# Patient Record
Sex: Male | Born: 1950 | Race: Black or African American | Hispanic: No | Marital: Married | Smoking: Former smoker
Health system: Southern US, Community
[De-identification: ages and names within clinical notes are randomized; demographics above are authoritative.]

## PROBLEM LIST (undated history)

## (undated) DIAGNOSIS — I10 Essential (primary) hypertension: Secondary | ICD-10-CM

## (undated) DIAGNOSIS — M549 Dorsalgia, unspecified: Secondary | ICD-10-CM

## (undated) DIAGNOSIS — U071 COVID-19: Secondary | ICD-10-CM

## (undated) DIAGNOSIS — K8020 Calculus of gallbladder without cholecystitis without obstruction: Secondary | ICD-10-CM

## (undated) DIAGNOSIS — M1990 Unspecified osteoarthritis, unspecified site: Secondary | ICD-10-CM

## (undated) DIAGNOSIS — C439 Malignant melanoma of skin, unspecified: Secondary | ICD-10-CM

## (undated) DIAGNOSIS — D649 Anemia, unspecified: Secondary | ICD-10-CM

## (undated) DIAGNOSIS — J349 Unspecified disorder of nose and nasal sinuses: Secondary | ICD-10-CM

## (undated) DIAGNOSIS — R011 Cardiac murmur, unspecified: Secondary | ICD-10-CM

## (undated) DIAGNOSIS — M51369 Other intervertebral disc degeneration, lumbar region without mention of lumbar back pain or lower extremity pain: Secondary | ICD-10-CM

## (undated) DIAGNOSIS — J189 Pneumonia, unspecified organism: Secondary | ICD-10-CM

## (undated) DIAGNOSIS — C9000 Multiple myeloma not having achieved remission: Secondary | ICD-10-CM

## (undated) DIAGNOSIS — C61 Malignant neoplasm of prostate: Secondary | ICD-10-CM

## (undated) HISTORY — PX: CHOLECYSTECTOMY: SHX55

## (undated) HISTORY — PX: APPENDECTOMY: SHX54

---

## 1999-07-22 ENCOUNTER — Emergency Department (HOSPITAL_COMMUNITY): Admission: EM | Admit: 1999-07-22 | Discharge: 1999-07-22 | Payer: Self-pay | Admitting: Emergency Medicine

## 1999-08-07 ENCOUNTER — Emergency Department (HOSPITAL_COMMUNITY): Admission: EM | Admit: 1999-08-07 | Discharge: 1999-08-07 | Payer: Self-pay | Admitting: Emergency Medicine

## 2001-01-01 ENCOUNTER — Encounter: Payer: Self-pay | Admitting: Family Medicine

## 2001-01-01 ENCOUNTER — Ambulatory Visit (HOSPITAL_COMMUNITY): Admission: RE | Admit: 2001-01-01 | Discharge: 2001-01-01 | Payer: Self-pay

## 2001-07-18 ENCOUNTER — Encounter: Payer: Self-pay | Admitting: Emergency Medicine

## 2001-07-18 ENCOUNTER — Encounter: Payer: Self-pay | Admitting: General Surgery

## 2001-07-18 ENCOUNTER — Emergency Department (HOSPITAL_COMMUNITY): Admission: EM | Admit: 2001-07-18 | Discharge: 2001-07-18 | Payer: Self-pay | Admitting: Emergency Medicine

## 2001-07-20 ENCOUNTER — Ambulatory Visit (HOSPITAL_COMMUNITY): Admission: RE | Admit: 2001-07-20 | Discharge: 2001-07-21 | Payer: Self-pay | Admitting: General Surgery

## 2001-07-20 ENCOUNTER — Encounter (INDEPENDENT_AMBULATORY_CARE_PROVIDER_SITE_OTHER): Payer: Self-pay | Admitting: *Deleted

## 2001-11-26 ENCOUNTER — Emergency Department (HOSPITAL_COMMUNITY): Admission: EM | Admit: 2001-11-26 | Discharge: 2001-11-26 | Payer: Self-pay

## 2002-06-29 ENCOUNTER — Emergency Department (HOSPITAL_COMMUNITY): Admission: EM | Admit: 2002-06-29 | Discharge: 2002-06-29 | Payer: Self-pay | Admitting: Emergency Medicine

## 2003-01-28 ENCOUNTER — Emergency Department (HOSPITAL_COMMUNITY): Admission: EM | Admit: 2003-01-28 | Discharge: 2003-01-28 | Payer: Self-pay | Admitting: *Deleted

## 2003-01-28 ENCOUNTER — Encounter: Payer: Self-pay | Admitting: Emergency Medicine

## 2003-05-19 ENCOUNTER — Emergency Department (HOSPITAL_COMMUNITY): Admission: EM | Admit: 2003-05-19 | Discharge: 2003-05-19 | Payer: Self-pay | Admitting: Emergency Medicine

## 2004-07-15 HISTORY — PX: OTHER SURGICAL HISTORY: SHX169

## 2004-08-09 ENCOUNTER — Ambulatory Visit: Payer: Self-pay | Admitting: Nurse Practitioner

## 2004-08-10 ENCOUNTER — Ambulatory Visit: Payer: Self-pay | Admitting: *Deleted

## 2005-04-17 ENCOUNTER — Emergency Department (HOSPITAL_COMMUNITY): Admission: EM | Admit: 2005-04-17 | Discharge: 2005-04-17 | Payer: Self-pay | Admitting: Emergency Medicine

## 2005-10-22 ENCOUNTER — Emergency Department (HOSPITAL_COMMUNITY): Admission: EM | Admit: 2005-10-22 | Discharge: 2005-10-22 | Payer: Self-pay | Admitting: Emergency Medicine

## 2006-02-13 ENCOUNTER — Emergency Department (HOSPITAL_COMMUNITY): Admission: EM | Admit: 2006-02-13 | Discharge: 2006-02-13 | Payer: Self-pay | Admitting: Emergency Medicine

## 2006-02-20 ENCOUNTER — Ambulatory Visit: Payer: Self-pay | Admitting: Nurse Practitioner

## 2006-02-24 ENCOUNTER — Emergency Department (HOSPITAL_COMMUNITY): Admission: EM | Admit: 2006-02-24 | Discharge: 2006-02-24 | Payer: Self-pay | Admitting: Emergency Medicine

## 2006-03-13 ENCOUNTER — Ambulatory Visit: Payer: Self-pay | Admitting: Nurse Practitioner

## 2006-03-28 ENCOUNTER — Ambulatory Visit: Payer: Self-pay | Admitting: Nurse Practitioner

## 2006-04-03 ENCOUNTER — Ambulatory Visit: Payer: Self-pay | Admitting: Nurse Practitioner

## 2006-04-10 ENCOUNTER — Ambulatory Visit: Payer: Self-pay | Admitting: Nurse Practitioner

## 2006-05-15 ENCOUNTER — Ambulatory Visit: Payer: Self-pay | Admitting: Nurse Practitioner

## 2006-08-04 ENCOUNTER — Emergency Department (HOSPITAL_COMMUNITY): Admission: EM | Admit: 2006-08-04 | Discharge: 2006-08-04 | Payer: Self-pay | Admitting: Emergency Medicine

## 2006-08-08 ENCOUNTER — Ambulatory Visit: Payer: Self-pay | Admitting: Internal Medicine

## 2006-08-09 ENCOUNTER — Ambulatory Visit (HOSPITAL_COMMUNITY): Admission: RE | Admit: 2006-08-09 | Discharge: 2006-08-09 | Payer: Self-pay | Admitting: Internal Medicine

## 2006-08-29 ENCOUNTER — Ambulatory Visit: Payer: Self-pay | Admitting: Internal Medicine

## 2006-09-09 ENCOUNTER — Encounter (INDEPENDENT_AMBULATORY_CARE_PROVIDER_SITE_OTHER): Payer: Self-pay | Admitting: Cardiology

## 2006-09-09 ENCOUNTER — Ambulatory Visit (HOSPITAL_COMMUNITY): Admission: RE | Admit: 2006-09-09 | Discharge: 2006-09-09 | Payer: Self-pay | Admitting: Family Medicine

## 2006-09-19 ENCOUNTER — Ambulatory Visit: Payer: Self-pay | Admitting: Nurse Practitioner

## 2006-11-18 ENCOUNTER — Emergency Department (HOSPITAL_COMMUNITY): Admission: EM | Admit: 2006-11-18 | Discharge: 2006-11-18 | Payer: Self-pay | Admitting: Emergency Medicine

## 2006-12-18 ENCOUNTER — Ambulatory Visit: Payer: Self-pay | Admitting: Internal Medicine

## 2007-04-01 ENCOUNTER — Encounter (INDEPENDENT_AMBULATORY_CARE_PROVIDER_SITE_OTHER): Payer: Self-pay | Admitting: *Deleted

## 2007-05-12 ENCOUNTER — Ambulatory Visit: Payer: Self-pay | Admitting: Internal Medicine

## 2007-08-18 ENCOUNTER — Ambulatory Visit: Payer: Self-pay | Admitting: Family Medicine

## 2007-10-24 ENCOUNTER — Emergency Department (HOSPITAL_COMMUNITY): Admission: EM | Admit: 2007-10-24 | Discharge: 2007-10-24 | Payer: Self-pay | Admitting: Emergency Medicine

## 2008-02-09 ENCOUNTER — Ambulatory Visit: Payer: Self-pay | Admitting: Internal Medicine

## 2008-08-09 ENCOUNTER — Ambulatory Visit: Payer: Self-pay | Admitting: Family Medicine

## 2008-08-09 ENCOUNTER — Encounter (INDEPENDENT_AMBULATORY_CARE_PROVIDER_SITE_OTHER): Payer: Self-pay | Admitting: Internal Medicine

## 2008-08-09 LAB — CONVERTED CEMR LAB
HCT: 36.4 % — ABNORMAL LOW (ref 39.0–52.0)
Lymphocytes Relative: 46 % (ref 12–46)
MCHC: 31.9 g/dL (ref 30.0–36.0)
MCV: 97.3 fL (ref 78.0–100.0)
Neutro Abs: 2.1 10*3/uL (ref 1.7–7.7)
Platelets: 207 10*3/uL (ref 150–400)
RDW: 12.9 % (ref 11.5–15.5)
WBC: 4.6 10*3/uL (ref 4.0–10.5)

## 2008-08-29 ENCOUNTER — Ambulatory Visit: Payer: Self-pay | Admitting: Family Medicine

## 2008-08-29 ENCOUNTER — Encounter (INDEPENDENT_AMBULATORY_CARE_PROVIDER_SITE_OTHER): Payer: Self-pay | Admitting: Internal Medicine

## 2008-08-29 LAB — CONVERTED CEMR LAB
Basophils Absolute: 0 10*3/uL (ref 0.0–0.1)
Basophils Relative: 0 % (ref 0–1)
Eosinophils Relative: 1 % (ref 0–5)
MCHC: 31.9 g/dL (ref 30.0–36.0)
RBC: 3.56 M/uL — ABNORMAL LOW (ref 4.22–5.81)
RDW: 13 % (ref 11.5–15.5)
WBC: 4.1 10*3/uL (ref 4.0–10.5)

## 2008-09-16 ENCOUNTER — Ambulatory Visit: Payer: Self-pay | Admitting: Oncology

## 2008-10-24 LAB — COMPREHENSIVE METABOLIC PANEL
ALT: 23 U/L (ref 0–53)
AST: 24 U/L (ref 0–37)
Creatinine, Ser: 0.99 mg/dL (ref 0.40–1.50)
Total Bilirubin: 0.5 mg/dL (ref 0.3–1.2)

## 2008-10-24 LAB — CBC & DIFF AND RETIC
BASO%: 0.4 % (ref 0.0–2.0)
Basophils Absolute: 0 10*3/uL (ref 0.0–0.1)
EOS%: 0.7 % (ref 0.0–7.0)
HGB: 11.2 g/dL — ABNORMAL LOW (ref 13.0–17.1)
IRF: 0.37 (ref 0.070–0.380)
MCH: 33.4 pg (ref 27.2–33.4)
MCHC: 33.7 g/dL (ref 32.0–36.0)
MONO#: 0.2 10*3/uL (ref 0.1–0.9)
RDW: 13.4 % (ref 11.0–14.6)
RETIC #: 35.6 10*3/uL (ref 31.8–103.9)
WBC: 3.4 10*3/uL — ABNORMAL LOW (ref 4.0–10.3)
lymph#: 1.7 10*3/uL (ref 0.9–3.3)

## 2008-10-24 LAB — CHCC SMEAR

## 2008-10-26 LAB — FOLATE RBC: RBC Folate: 700 ng/mL — ABNORMAL HIGH (ref 180–600)

## 2008-10-26 LAB — IRON AND TIBC
%SAT: 22 % (ref 20–55)
TIBC: 327 ug/dL (ref 215–435)

## 2008-10-26 LAB — FERRITIN: Ferritin: 156 ng/mL (ref 22–322)

## 2008-10-26 LAB — VITAMIN B12: Vitamin B-12: 307 pg/mL (ref 211–911)

## 2008-11-18 ENCOUNTER — Ambulatory Visit: Payer: Self-pay | Admitting: Oncology

## 2008-11-22 LAB — COMPREHENSIVE METABOLIC PANEL
ALT: 15 U/L (ref 0–53)
CO2: 25 mEq/L (ref 19–32)
Calcium: 9.2 mg/dL (ref 8.4–10.5)
Chloride: 103 mEq/L (ref 96–112)
Creatinine, Ser: 0.86 mg/dL (ref 0.40–1.50)
Glucose, Bld: 80 mg/dL (ref 70–99)

## 2008-11-22 LAB — CBC WITH DIFFERENTIAL/PLATELET
Basophils Absolute: 0 10*3/uL (ref 0.0–0.1)
Eosinophils Absolute: 0 10*3/uL (ref 0.0–0.5)
HCT: 34.2 % — ABNORMAL LOW (ref 38.4–49.9)
HGB: 11.8 g/dL — ABNORMAL LOW (ref 13.0–17.1)
LYMPH%: 36.8 % (ref 14.0–49.0)
MCV: 97.3 fL (ref 79.3–98.0)
MONO#: 0.2 10*3/uL (ref 0.1–0.9)
MONO%: 5.7 % (ref 0.0–14.0)
NEUT#: 2.4 10*3/uL (ref 1.5–6.5)
NEUT%: 56.7 % (ref 39.0–75.0)
Platelets: 154 10*3/uL (ref 140–400)
RBC: 3.51 10*6/uL — ABNORMAL LOW (ref 4.20–5.82)
WBC: 4.2 10*3/uL (ref 4.0–10.3)

## 2008-11-22 LAB — LACTATE DEHYDROGENASE: LDH: 134 U/L (ref 94–250)

## 2008-11-25 ENCOUNTER — Ambulatory Visit (HOSPITAL_COMMUNITY): Admission: RE | Admit: 2008-11-25 | Discharge: 2008-11-25 | Payer: Self-pay | Admitting: Oncology

## 2008-12-23 LAB — CBC WITH DIFFERENTIAL/PLATELET
Basophils Absolute: 0 10*3/uL (ref 0.0–0.1)
Eosinophils Absolute: 0 10*3/uL (ref 0.0–0.5)
HGB: 11.8 g/dL — ABNORMAL LOW (ref 13.0–17.1)
MONO#: 0.3 10*3/uL (ref 0.1–0.9)
NEUT#: 2.2 10*3/uL (ref 1.5–6.5)
RBC: 3.62 10*6/uL — ABNORMAL LOW (ref 4.20–5.82)
RDW: 12.5 % (ref 11.0–14.6)
WBC: 4 10*3/uL (ref 4.0–10.3)

## 2009-01-04 ENCOUNTER — Ambulatory Visit (HOSPITAL_COMMUNITY): Admission: RE | Admit: 2009-01-04 | Discharge: 2009-01-04 | Payer: Self-pay | Admitting: Gastroenterology

## 2009-01-17 ENCOUNTER — Ambulatory Visit: Payer: Self-pay | Admitting: Oncology

## 2009-01-19 LAB — CBC WITH DIFFERENTIAL/PLATELET
BASO%: 0.4 % (ref 0.0–2.0)
Basophils Absolute: 0 10*3/uL (ref 0.0–0.1)
EOS%: 0.8 % (ref 0.0–7.0)
HCT: 33.2 % — ABNORMAL LOW (ref 38.4–49.9)
HGB: 11.6 g/dL — ABNORMAL LOW (ref 13.0–17.1)
MCH: 34.2 pg — ABNORMAL HIGH (ref 27.2–33.4)
MCHC: 35.1 g/dL (ref 32.0–36.0)
MONO#: 0.2 10*3/uL (ref 0.1–0.9)
NEUT%: 54.7 % (ref 39.0–75.0)
RDW: 12.9 % (ref 11.0–14.6)
WBC: 3.8 10*3/uL — ABNORMAL LOW (ref 4.0–10.3)
lymph#: 1.4 10*3/uL (ref 0.9–3.3)

## 2009-02-22 ENCOUNTER — Encounter (INDEPENDENT_AMBULATORY_CARE_PROVIDER_SITE_OTHER): Payer: Self-pay | Admitting: Internal Medicine

## 2009-02-22 ENCOUNTER — Ambulatory Visit: Payer: Self-pay | Admitting: Internal Medicine

## 2009-02-22 LAB — CONVERTED CEMR LAB
ALT: 17 units/L (ref 0–53)
BUN: 14 mg/dL (ref 6–23)
Basophils Absolute: 0 10*3/uL (ref 0.0–0.1)
Chloride: 104 meq/L (ref 96–112)
Creatinine, Ser: 0.85 mg/dL (ref 0.40–1.50)
Eosinophils Relative: 1 % (ref 0–5)
Glucose, Bld: 84 mg/dL (ref 70–99)
HCT: 35.9 % — ABNORMAL LOW (ref 39.0–52.0)
Hemoglobin: 11.7 g/dL — ABNORMAL LOW (ref 13.0–17.0)
Lymphocytes Relative: 46 % (ref 12–46)
Lymphs Abs: 2.1 10*3/uL (ref 0.7–4.0)
Monocytes Absolute: 0.3 10*3/uL (ref 0.1–1.0)
Monocytes Relative: 6 % (ref 3–12)
Platelets: 170 10*3/uL (ref 150–400)
TSH: 1.776 microintl units/mL (ref 0.350–4.500)
WBC: 4.6 10*3/uL (ref 4.0–10.5)

## 2009-02-23 ENCOUNTER — Encounter (INDEPENDENT_AMBULATORY_CARE_PROVIDER_SITE_OTHER): Payer: Self-pay | Admitting: Internal Medicine

## 2009-03-23 ENCOUNTER — Ambulatory Visit: Payer: Self-pay | Admitting: Oncology

## 2009-06-28 ENCOUNTER — Ambulatory Visit: Payer: Self-pay | Admitting: Internal Medicine

## 2009-06-28 LAB — CONVERTED CEMR LAB
Alpha-2-Globulin: 7.5 % (ref 7.1–11.8)
Beta Globulin: 4.8 % (ref 4.7–7.2)
Ferritin: 186 ng/mL (ref 22–322)
Folate: 19.3 ng/mL
Gamma Globulin: 29.6 % — ABNORMAL HIGH (ref 11.1–18.8)
Hgb A2 Quant: 2.3 % (ref 2.2–3.2)
Hgb A: 97.7 % (ref 96.8–97.8)
Iron: 83 ug/dL (ref 42–165)
Lead-Whole Blood: 0.7 ug/dL (ref <10.0)
M-Spike, %: 2.06
TIBC: 327 ug/dL (ref 215–435)
Total Protein, Serum Electrophoresis: 8.1 g/dL (ref 6.0–8.3)
UIBC: 244 ug/dL

## 2009-07-18 ENCOUNTER — Ambulatory Visit: Payer: Self-pay | Admitting: Oncology

## 2009-07-18 LAB — CBC WITH DIFFERENTIAL/PLATELET
Eosinophils Absolute: 0 10*3/uL (ref 0.0–0.5)
HCT: 35.6 % — ABNORMAL LOW (ref 38.4–49.9)
HGB: 12 g/dL — ABNORMAL LOW (ref 13.0–17.1)
MONO%: 4.8 % (ref 0.0–14.0)
RBC: 3.69 10*6/uL — ABNORMAL LOW (ref 4.20–5.82)
RDW: 12.8 % (ref 11.0–14.6)
WBC: 4.1 10*3/uL (ref 4.0–10.3)
lymph#: 2 10*3/uL (ref 0.9–3.3)

## 2009-07-18 LAB — COMPREHENSIVE METABOLIC PANEL
BUN: 24 mg/dL — ABNORMAL HIGH (ref 6–23)
CO2: 28 mEq/L (ref 19–32)
Chloride: 102 mEq/L (ref 96–112)
Creatinine, Ser: 0.9 mg/dL (ref 0.40–1.50)
Sodium: 134 mEq/L — ABNORMAL LOW (ref 135–145)
Total Protein: 8.3 g/dL (ref 6.0–8.3)

## 2009-07-20 LAB — IMMUNOFIXATION ELECTROPHORESIS
IgG (Immunoglobin G), Serum: 2700 mg/dL — ABNORMAL HIGH (ref 694–1618)
Total Protein, Serum Electrophoresis: 8.3 g/dL (ref 6.0–8.3)

## 2009-07-20 LAB — KAPPA/LAMBDA LIGHT CHAINS: Kappa free light chain: 3.83 mg/dL — ABNORMAL HIGH (ref 0.33–1.94)

## 2009-07-20 LAB — SEDIMENTATION RATE: Sed Rate: 20 mm/hr — ABNORMAL HIGH (ref 0–16)

## 2009-07-22 ENCOUNTER — Emergency Department (HOSPITAL_COMMUNITY): Admission: EM | Admit: 2009-07-22 | Discharge: 2009-07-22 | Payer: Self-pay | Admitting: Emergency Medicine

## 2009-07-26 LAB — CREATININE CLEARANCE, URINE, 24 HOUR

## 2009-07-26 LAB — UIFE/LIGHT CHAINS/TP QN, 24-HR UR
Beta, Urine: DETECTED — AB
Free Kappa Lt Chains,Ur: 4.6 mg/dL — ABNORMAL HIGH (ref 0.04–1.51)
Free Lambda Lt Chains,Ur: 0.17 mg/dL (ref 0.08–1.01)
Free Lt Chn Excr Rate: 23 mg/d
Total Protein, Urine-Ur/day: 27 mg/d (ref 10–140)
Total Protein, Urine: 5.4 mg/dL

## 2009-07-31 ENCOUNTER — Ambulatory Visit (HOSPITAL_COMMUNITY): Admission: RE | Admit: 2009-07-31 | Discharge: 2009-07-31 | Payer: Self-pay | Admitting: Oncology

## 2009-08-08 ENCOUNTER — Ambulatory Visit (HOSPITAL_COMMUNITY): Admission: RE | Admit: 2009-08-08 | Discharge: 2009-08-08 | Payer: Self-pay | Admitting: Oncology

## 2009-08-25 ENCOUNTER — Ambulatory Visit: Payer: Self-pay | Admitting: Oncology

## 2009-09-29 ENCOUNTER — Ambulatory Visit: Payer: Self-pay | Admitting: Oncology

## 2009-10-12 LAB — CBC WITH DIFFERENTIAL/PLATELET
BASO%: 0.3 % (ref 0.0–2.0)
EOS%: 0.3 % (ref 0.0–7.0)
Eosinophils Absolute: 0 10*3/uL (ref 0.0–0.5)
MCHC: 34.5 g/dL (ref 32.0–36.0)
MCV: 98.8 fL — ABNORMAL HIGH (ref 79.3–98.0)
MONO%: 5.1 % (ref 0.0–14.0)
NEUT#: 2.3 10*3/uL (ref 1.5–6.5)
Platelets: 137 10*3/uL — ABNORMAL LOW (ref 140–400)
RDW: 13.1 % (ref 11.0–14.6)
WBC: 3.8 10*3/uL — ABNORMAL LOW (ref 4.0–10.3)
lymph#: 1.3 10*3/uL (ref 0.9–3.3)

## 2009-10-12 LAB — COMPREHENSIVE METABOLIC PANEL
BUN: 19 mg/dL (ref 6–23)
CO2: 25 mEq/L (ref 19–32)
Glucose, Bld: 119 mg/dL — ABNORMAL HIGH (ref 70–99)
Sodium: 134 mEq/L — ABNORMAL LOW (ref 135–145)
Total Protein: 8.2 g/dL (ref 6.0–8.3)

## 2009-10-12 LAB — LACTATE DEHYDROGENASE: LDH: 146 U/L (ref 94–250)

## 2009-10-17 ENCOUNTER — Ambulatory Visit: Payer: Self-pay | Admitting: Oncology

## 2009-10-17 ENCOUNTER — Ambulatory Visit (HOSPITAL_COMMUNITY): Admission: RE | Admit: 2009-10-17 | Discharge: 2009-10-17 | Payer: Self-pay | Admitting: Oncology

## 2009-11-08 ENCOUNTER — Ambulatory Visit: Payer: Self-pay | Admitting: Oncology

## 2009-11-13 ENCOUNTER — Ambulatory Visit: Payer: Self-pay | Admitting: Internal Medicine

## 2009-11-30 LAB — CBC WITH DIFFERENTIAL/PLATELET
BASO%: 0.3 % (ref 0.0–2.0)
EOS%: 0.6 % (ref 0.0–7.0)
HCT: 32.4 % — ABNORMAL LOW (ref 38.4–49.9)
HGB: 11.1 g/dL — ABNORMAL LOW (ref 13.0–17.1)
LYMPH%: 36.9 % (ref 14.0–49.0)
MCH: 33.3 pg (ref 27.2–33.4)
MCV: 97.3 fL (ref 79.3–98.0)
MONO%: 5.9 % (ref 0.0–14.0)
NEUT#: 2 10*3/uL (ref 1.5–6.5)
Platelets: 182 10*3/uL (ref 140–400)
RDW: 12.7 % (ref 11.0–14.6)
WBC: 3.6 10*3/uL — ABNORMAL LOW (ref 4.0–10.3)

## 2009-11-30 LAB — COMPREHENSIVE METABOLIC PANEL
ALT: 14 U/L (ref 0–53)
AST: 18 U/L (ref 0–37)
Albumin: 4.1 g/dL (ref 3.5–5.2)
BUN: 21 mg/dL (ref 6–23)
Chloride: 103 mEq/L (ref 96–112)
Potassium: 4.4 mEq/L (ref 3.5–5.3)
Total Bilirubin: 0.3 mg/dL (ref 0.3–1.2)
Total Protein: 8.5 g/dL — ABNORMAL HIGH (ref 6.0–8.3)

## 2009-11-30 LAB — IGG: IgG (Immunoglobin G), Serum: 2870 mg/dL — ABNORMAL HIGH (ref 694–1618)

## 2010-01-30 ENCOUNTER — Ambulatory Visit: Payer: Self-pay | Admitting: Oncology

## 2010-02-15 LAB — CBC WITH DIFFERENTIAL/PLATELET
BASO%: 0.3 % (ref 0.0–2.0)
Basophils Absolute: 0 10*3/uL (ref 0.0–0.1)
Eosinophils Absolute: 0 10*3/uL (ref 0.0–0.5)
HGB: 11 g/dL — ABNORMAL LOW (ref 13.0–17.1)
MCHC: 34.1 g/dL (ref 32.0–36.0)
MONO#: 0.2 10*3/uL (ref 0.1–0.9)
MONO%: 5.3 % (ref 0.0–14.0)
NEUT%: 54.9 % (ref 39.0–75.0)
RBC: 3.28 10*6/uL — ABNORMAL LOW (ref 4.20–5.82)
lymph#: 1.3 10*3/uL (ref 0.9–3.3)

## 2010-02-15 LAB — COMPREHENSIVE METABOLIC PANEL
AST: 25 U/L (ref 0–37)
Alkaline Phosphatase: 56 U/L (ref 39–117)
BUN: 19 mg/dL (ref 6–23)
CO2: 28 mEq/L (ref 19–32)
Glucose, Bld: 96 mg/dL (ref 70–99)
Sodium: 140 mEq/L (ref 135–145)
Total Bilirubin: 0.6 mg/dL (ref 0.3–1.2)

## 2010-02-16 LAB — IGG: IgG (Immunoglobin G), Serum: 2700 mg/dL — ABNORMAL HIGH (ref 694–1618)

## 2010-04-02 ENCOUNTER — Encounter (INDEPENDENT_AMBULATORY_CARE_PROVIDER_SITE_OTHER): Payer: Self-pay | Admitting: Internal Medicine

## 2010-04-02 LAB — CONVERTED CEMR LAB
Creatinine, Ser: 0.94 mg/dL (ref 0.40–1.50)
HDL: 51 mg/dL (ref >39)
LDL Cholesterol: 103 mg/dL — ABNORMAL HIGH (ref 0–99)
Potassium: 4.4 meq/L (ref 3.5–5.3)
Sodium: 138 meq/L (ref 135–145)
Total CHOL/HDL Ratio: 3.3
VLDL: 13 mg/dL (ref 0–40)

## 2010-04-20 ENCOUNTER — Ambulatory Visit: Payer: Self-pay | Admitting: Oncology

## 2010-04-23 LAB — COMPREHENSIVE METABOLIC PANEL
ALT: 19 U/L (ref 0–53)
AST: 21 U/L (ref 0–37)
Albumin: 3.8 g/dL (ref 3.5–5.2)
Alkaline Phosphatase: 59 U/L (ref 39–117)
Potassium: 4.4 mEq/L (ref 3.5–5.3)
Total Protein: 8.5 g/dL — ABNORMAL HIGH (ref 6.0–8.3)

## 2010-04-23 LAB — CBC WITH DIFFERENTIAL/PLATELET
BASO%: 0.2 % (ref 0.0–2.0)
Basophils Absolute: 0 10*3/uL (ref 0.0–0.1)
EOS%: 0.5 % (ref 0.0–7.0)
HCT: 35 % — ABNORMAL LOW (ref 38.4–49.9)
HGB: 12.1 g/dL — ABNORMAL LOW (ref 13.0–17.1)
LYMPH%: 50.2 % — ABNORMAL HIGH (ref 14.0–49.0)
MONO#: 0.3 10*3/uL (ref 0.1–0.9)
NEUT%: 41.9 % (ref 39.0–75.0)
RDW: 13.1 % (ref 11.0–14.6)
lymph#: 2 10*3/uL (ref 0.9–3.3)

## 2010-04-23 LAB — LACTATE DEHYDROGENASE: LDH: 132 U/L (ref 94–250)

## 2010-04-24 LAB — IGG: IgG (Immunoglobin G), Serum: 3130 mg/dL — ABNORMAL HIGH (ref 694–1618)

## 2010-06-20 ENCOUNTER — Ambulatory Visit: Payer: Self-pay | Admitting: Oncology

## 2010-06-22 LAB — COMPREHENSIVE METABOLIC PANEL
ALT: 22 U/L (ref 0–53)
AST: 24 U/L (ref 0–37)
Albumin: 3.7 g/dL (ref 3.5–5.2)
Alkaline Phosphatase: 50 U/L (ref 39–117)
BUN: 15 mg/dL (ref 6–23)
CO2: 27 mEq/L (ref 19–32)
Calcium: 9.1 mg/dL (ref 8.4–10.5)
Chloride: 104 mEq/L (ref 96–112)
Creatinine, Ser: 0.85 mg/dL (ref 0.40–1.50)
Glucose, Bld: 88 mg/dL (ref 70–99)
Potassium: 4.3 mEq/L (ref 3.5–5.3)
Sodium: 135 mEq/L (ref 135–145)
Total Bilirubin: 0.6 mg/dL (ref 0.3–1.2)
Total Protein: 8.3 g/dL (ref 6.0–8.3)

## 2010-06-22 LAB — CBC WITH DIFFERENTIAL/PLATELET
BASO%: 0.3 % (ref 0.0–2.0)
Basophils Absolute: 0 10*3/uL (ref 0.0–0.1)
EOS%: 0.9 % (ref 0.0–7.0)
Eosinophils Absolute: 0 10*3/uL (ref 0.0–0.5)
HCT: 31.3 % — ABNORMAL LOW (ref 38.4–49.9)
HGB: 11 g/dL — ABNORMAL LOW (ref 13.0–17.1)
LYMPH%: 45.8 % (ref 14.0–49.0)
MCH: 34.4 pg — ABNORMAL HIGH (ref 27.2–33.4)
MCHC: 35.1 g/dL (ref 32.0–36.0)
MCV: 98 fL (ref 79.3–98.0)
MONO#: 0.2 10*3/uL (ref 0.1–0.9)
MONO%: 6.8 % (ref 0.0–14.0)
NEUT#: 1.5 10*3/uL (ref 1.5–6.5)
NEUT%: 46.2 % (ref 39.0–75.0)
Platelets: 153 10*3/uL (ref 140–400)
RBC: 3.2 10*6/uL — ABNORMAL LOW (ref 4.20–5.82)
RDW: 13.1 % (ref 11.0–14.6)
WBC: 3.2 10*3/uL — ABNORMAL LOW (ref 4.0–10.3)
lymph#: 1.4 10*3/uL (ref 0.9–3.3)

## 2010-06-22 LAB — LACTATE DEHYDROGENASE: LDH: 126 U/L (ref 94–250)

## 2010-06-25 LAB — BETA 2 MICROGLOBULIN, SERUM: Beta-2 Microglobulin: 1.49 mg/L (ref 1.01–1.73)

## 2010-06-25 LAB — KAPPA/LAMBDA LIGHT CHAINS
Kappa free light chain: 3.72 mg/dL — ABNORMAL HIGH (ref 0.33–1.94)
Kappa:Lambda Ratio: 4.04 — ABNORMAL HIGH (ref 0.26–1.65)
Lambda Free Lght Chn: 0.92 mg/dL (ref 0.57–2.63)

## 2010-06-25 LAB — IGG: IgG (Immunoglobin G), Serum: 2850 mg/dL — ABNORMAL HIGH (ref 694–1618)

## 2010-08-05 ENCOUNTER — Encounter (HOSPITAL_COMMUNITY): Payer: Self-pay | Admitting: Oncology

## 2010-08-06 ENCOUNTER — Encounter (HOSPITAL_COMMUNITY): Payer: Self-pay | Admitting: Oncology

## 2010-09-03 ENCOUNTER — Emergency Department (HOSPITAL_COMMUNITY)
Admission: EM | Admit: 2010-09-03 | Discharge: 2010-09-03 | Disposition: A | Discharge status: Home / Self Care | Payer: Self-pay | Attending: Emergency Medicine | Admitting: Emergency Medicine

## 2010-09-03 DIAGNOSIS — Z79899 Other long term (current) drug therapy: Secondary | ICD-10-CM | POA: Insufficient documentation

## 2010-09-03 DIAGNOSIS — R5383 Other fatigue: Secondary | ICD-10-CM | POA: Insufficient documentation

## 2010-09-03 DIAGNOSIS — M129 Arthropathy, unspecified: Secondary | ICD-10-CM | POA: Insufficient documentation

## 2010-09-03 DIAGNOSIS — Z87898 Personal history of other specified conditions: Secondary | ICD-10-CM | POA: Insufficient documentation

## 2010-09-03 DIAGNOSIS — R5381 Other malaise: Secondary | ICD-10-CM | POA: Insufficient documentation

## 2010-09-03 DIAGNOSIS — I10 Essential (primary) hypertension: Secondary | ICD-10-CM | POA: Insufficient documentation

## 2010-09-03 LAB — COMPREHENSIVE METABOLIC PANEL
ALT: 17 U/L (ref 0–53)
AST: 23 U/L (ref 0–37)
CO2: 27 mEq/L (ref 19–32)
Calcium: 9 mg/dL (ref 8.4–10.5)
Chloride: 103 mEq/L (ref 96–112)
Total Bilirubin: 0.4 mg/dL (ref 0.3–1.2)
Total Protein: 8.7 g/dL — ABNORMAL HIGH (ref 6.0–8.3)

## 2010-09-03 LAB — CBC
Hemoglobin: 11.9 g/dL — ABNORMAL LOW (ref 13.0–17.0)
MCH: 31.8 pg (ref 26.0–34.0)
MCHC: 33.1 g/dL (ref 30.0–36.0)
WBC: 3.8 10*3/uL — ABNORMAL LOW (ref 4.0–10.5)

## 2010-09-03 LAB — POCT I-STAT, CHEM 8
Calcium, Ion: 1.19 mmol/L (ref 1.12–1.32)
Chloride: 103 mEq/L (ref 96–112)
Hemoglobin: 13.3 g/dL (ref 13.0–17.0)
Sodium: 138 mEq/L (ref 135–145)
TCO2: 26 mmol/L (ref 0–100)

## 2010-09-03 LAB — URINALYSIS, ROUTINE W REFLEX MICROSCOPIC
Bilirubin Urine: NEGATIVE
Hgb urine dipstick: NEGATIVE
Nitrite: NEGATIVE
Protein, ur: NEGATIVE mg/dL
Urobilinogen, UA: 1 mg/dL (ref 0.0–1.0)
pH: 7 (ref 5.0–8.0)

## 2010-09-03 LAB — DIFFERENTIAL
Basophils Absolute: 0 10*3/uL (ref 0.0–0.1)
Eosinophils Relative: 1 % (ref 0–5)
Lymphs Abs: 1.6 10*3/uL (ref 0.7–4.0)
Neutro Abs: 2.1 10*3/uL (ref 1.7–7.7)
Neutrophils Relative %: 54 % (ref 43–77)

## 2010-10-03 LAB — DIFFERENTIAL
Basophils Absolute: 0 10*3/uL (ref 0.0–0.1)
Basophils Relative: 0 % (ref 0–1)
Eosinophils Absolute: 0 10*3/uL (ref 0.0–0.7)
Monocytes Absolute: 0.2 10*3/uL (ref 0.1–1.0)
Neutro Abs: 1.5 10*3/uL — ABNORMAL LOW (ref 1.7–7.7)

## 2010-10-03 LAB — TISSUE HYBRIDIZATION (BONE MARROW)-NCBH

## 2010-10-03 LAB — CBC
Hemoglobin: 11.8 g/dL — ABNORMAL LOW (ref 13.0–17.0)
MCHC: 33.5 g/dL (ref 30.0–36.0)
MCV: 98.9 fL (ref 78.0–100.0)
RDW: 13 % (ref 11.5–15.5)

## 2010-10-03 LAB — CHROMOSOME ANALYSIS, BONE MARROW

## 2010-11-27 NOTE — Op Note (Signed)
NAME:  Travis Nguyen, Travis Nguyen            ACCOUNT NO.:  [id number]   MEDICAL RECORD NO.:  [id number]          PATIENT TYPE:  AMB   LOCATION:  ENDO                         FACILITY:  [facility]   PHYSICIAN:  Ulyess Mort, MD  DATE OF BIRTH:  [birth year]   DATE OF PROCEDURE:  DATE OF DISCHARGE:                               OPERATIVE REPORT   INDICATIONS FOR PROCEDURE:  Patient with history of mild pancytopenia  with a hemoglobin of 11.2 and hematocrit 33.  Iron saturation was 22%  and ferritin 156.  B12 level is normal.  Chemistry is normal.  Sed rate  was 13.  Because of his anemia, and the fact that he is 60 years of age,  it is felt that he should have a full colonoscopic examination.  He did  have stools for occult blood, which were normal.   On further history, he has no upper GI symptoms and really no lower GI  symptoms.  It is interesting that he says he does run a mile or 2 a day  and has for years.  I wonder if some of his mild anemia could be on this  basis, because this can occur.   PHYSICAL EXAMINATION:  Blood pressure 136/84, pulse 59 and regular,  respirations 16 and regular.  __________ was negative.  NECK:  Negative.  Thin.  CHEST:  Clear.  HEART:  Regular rhythm.  ABDOMEN:  Soft, flat.  No mass or organomegaly.  RECTAL:  Unremarkable.  EXTREMITIES:  Unremarkable.   ANESTHESIA:  Patient was pre-medicated with 8 mg of Versed and 100 mcg  of fentanyl IV.   PROCEDURE:  The Pentax colonoscope was advanced throughout the entire  colon without difficulty.  Careful inspection of his colon failed to  reveal any mucosal lesions.  He was very well prepped.  It was inverted  in the  rectum, and the anal area was visualized.  It appeared normal.  The  endoscope retracted completely.  The patient tolerated the procedure  nicely.   IMPRESSION:  __essentially normal examination.      Ulyess Mort, MD  Electronically Signed     SML/MEDQ  D:  01/04/2009  T:  01/04/2009   Job:  [id number]   cc:   Samul Dada, M.D.  Fax: [phone]   Dineen Kid. Reche Dixon, M.D.  Fax: [phone]

## 2010-11-30 NOTE — H&P (Signed)
[place]  Patient:    Travis Nguyen, Travis Nguyen Visit Number: [id number] MRN: [id number]          Service Type: EMS Location: [place] Attending Physician:  Doug Sou Dictated by:   Adolph Pollack, M.D. Admit Date:  07/18/2001   CC:         [facility]   History and Physical  CHIEF COMPLAINT/REASON FOR MY VISIT:  Upper abdominal pain with nausea and diaphoresis.  HISTORY OF PRESENT ILLNESS:  Travis Nguyen is a 60 year old man who has had two bouts of postprandial upper abdominal pressure type pain that resolved spontaneously.  It typically occurred after a fatty meal.  He had a chili dog last then, then had pain, pressure type, in the epigastrium with diaphoresis and nausea that woke him from sleep.  He presented to the emergency department for evaluation and without being given medication the pain has slowly improved.  He did undergo ultrasound of the abdomen which demonstrated cholelithiasis and some thickening of the gallbladder wall.  The biliary system was not dilated, although however, there was a possibility of some common bile duct stones.  It was for this reason I was asked to see him.  He denied fever or jaundice.  Currently, he has minimal pain and feels better overall.  PAST MEDICAL HISTORY:  Post-traumatic left pneumothorax.  PAST SURGICAL HISTORY:  None.  ALLERGIES:  None.  MEDICATIONS:  None.  SOCIAL HISTORY:  Denies tobacco or alcohol use.  He works for [employer].  FAMILY HISTORY:  Positive for heart disease in his brother and mother.  REVIEW OF SYSTEMS:  CARDIOVASCULAR:  He denies any known hypertension or heart disease.  PULMONARY:  He denies asthma, pneumonia, tuberculosis. GASTROINTESTINAL:  He denies peptic ulcer disease, hepatitis, colitis, diverticulitis.  GENITOURINARY:  He denies urinary tract infection or kidney stones.  HEMATOLOGIC:  He denies any bleeding disorders or  transfusions. NEUROLOGIC:  He denies strokes or seizures.  PHYSICAL EXAMINATION  GENERAL:  Well-developed, well-nourished male in no acute distress.  Very pleasant and cooperative, sitting in the chair.  VITAL SIGNS:  Temperature 96.5, blood pressure 113/76, pulse 52.  HEENT:  Eyes:  Extraocular movements are intact.  No icterus.  NECK:  Supple without palpable masses.  CARDIOVASCULAR:  Heart demonstrates a regular rate and rhythm.  No murmur.  RESPIRATORY:  Breath sounds equal and clear.  Respirations nonlabored.  ABDOMEN:  Soft with mild right upper quadrant tenderness.  No palpable masses noted.  Active bowel sounds are noted.  EXTREMITIES:  No cyanosis or edema.  LABORATORIES:  CBC is within normal limits.  His CMET is also within normal limits including normal liver function tests.  Lipase is normal.  Urinalysis: Unremarkable.  IMPRESSION:  Biliary colic secondary to cholelithiasis.  He may be having an early acute cholecystitis.  We have a discrepancy in that he has normal liver function tests and no biliary ductal dilatation, though there is a possibility of common duct stones.  PLAN:  I told the patient optimally we would go ahead and admit him today and perform a laparoscopic cholecystectomy with cholangiogram.  I did him that a second option which had some risk to it was to be to send him home on a liquid diet, oral antibiotics, and then try to do this at the beginning of the week. He prefers the second option despite me telling him that the first option is the best.  I will go ahead and send him home  from the emergency department with oral antibiotics and analgesics and will arrange for him to have his cholecystectomy either Monday or Tuesday. Dictated by:   Adolph Pollack, M.D. Attending Physician:  Doug Sou DD:  07/18/01 TD:  07/18/01 Job: [id number]

## 2010-11-30 NOTE — Op Note (Signed)
[place]  Patient:    Travis Nguyen, Travis Nguyen. Visit Number: [id number] MRN: [id number]          Service Type: Attending:  Adolph Pollack, M.D. Dictated by:   Adolph Pollack, M.D.                             Operative Report  PREOPERATIVE DIAGNOSIS:  Chronic calculus cholecystitis.  POSTOPERATIVE DIAGNOSIS:  Chronic calculus cholecystitis.  OPERATION PERFORMED:  Laparoscopic cholecystectomy with intraoperative cholangiogram.  SURGEON:  Adolph Pollack, M.D.  ANESTHESIA:  General.  INDICATIONS:  This is a 60 year old male who has been having intermittent episodes of postprandial epigastric pain and right upper quadrant pain radiating to the back.  He had a severe episode and presented to the emergency department Saturday morning.  At that time an ultrasound demonstrated multiple gallstones and a suggestion of choledocholithiasis.  By the time I saw him in the emergency department his pain had improved dramatically.  A white blood cell count was normal and liver function tests were normal.  He elected to wait until today to have elective cholecystectomy, which I recommended.  The procedure and the risks were explained to him preoperatively.  TECHNIQUE:  He was placed supine upon the operating table and general anesthetic was administered.  The abdomen was sterilely prepped and draped. Local anesthetic was infiltrated in the subumbilical region.  A small subumbilical incision was made and carried down to the fascia.  A 1 cm incision was made in the fascia and the peritoneal cavity was entered sharply; and, under direct vision a purse-string suture of 0-Vicryl was placed around the fascial edges.  A Hasson trocar was introduced through the peritoneal cavity. Pneumoperitoneum was created by insufflation of CO2 gas.  Next, the laparoscope was introduced.  The patient was placed in the appropriate position and an 11 mm trocar  was placed through an epigastric incision.  Two 5 mm trocars were then placed in the right mid abdomen.  There were adhesions between the omentum and colon, and the gallbladder; and, these were taken down bluntly.  We were able to retract the fundus towards the right shoulder and grab the infundibulum, and retract it laterally.  I completely mobilized the infundibulum and created a window around the cystic artery and cystic duct.  I clipped the cystic artery and divided it.  I placed a clip to the cystic duct gallbladder junction and made an incision into the cystic duct.  I passed a cholangio-catheter through the anterior abdominal wall and attempted to pass it through the cystic duct, but had trouble with one of the cystic duct valves.  I made an incision below the  valve and then placed a cholangio-cath in and performed a cholangiogram.   Under real-time fluoroscopy contrast material was injected into the cystic duct.  This demonstrated a very long cystic duct with a short common bile duct.  There were no obstructive lesions noted.  The common hepatic duct appeared clear as well.  The cholangio-catheter was removed.  The cystic duct was then clipped three times proximally with one of the clips not lying down properly, so I laced a fourth clip on it.  It was then divided sharply.  The gallbladder was dissected free from the liver bed intact using electrocautery.  The gallbladder was removed from through subumbilical incision and the fascial defect was closed by tightening up the purse-string suture.  The  perihepatic area was then inspected and copiously irrigated, and no bleeding or bile leak was noted.  The irrigation fluid was evacuated.  The pneumoperitoneum was released and the trocars removed.  The skin incisions were closed with 4-0 monocril subcuticular sutures followed by Steri-Strips and sterile dressings.  He tolerated the procedure well without any apparent complications  and was taken to the recovery room in satisfactory condition. Dictated by:   Adolph Pollack, M.D. Attending:  Adolph Pollack, M.D. DD:  07/20/01 TD:  07/20/01 Job: [id number]

## 2011-04-20 ENCOUNTER — Emergency Department (HOSPITAL_COMMUNITY): Payer: Self-pay

## 2011-04-20 ENCOUNTER — Emergency Department (HOSPITAL_COMMUNITY)
Admission: EM | Admit: 2011-04-20 | Discharge: 2011-04-20 | Disposition: A | Discharge status: Home / Self Care | Payer: Self-pay | Attending: Emergency Medicine | Admitting: Emergency Medicine

## 2011-04-20 DIAGNOSIS — I10 Essential (primary) hypertension: Secondary | ICD-10-CM | POA: Insufficient documentation

## 2011-04-20 DIAGNOSIS — M545 Low back pain, unspecified: Secondary | ICD-10-CM | POA: Insufficient documentation

## 2011-04-20 DIAGNOSIS — M129 Arthropathy, unspecified: Secondary | ICD-10-CM | POA: Insufficient documentation

## 2011-04-20 DIAGNOSIS — G8929 Other chronic pain: Secondary | ICD-10-CM | POA: Insufficient documentation

## 2011-07-10 ENCOUNTER — Emergency Department (HOSPITAL_COMMUNITY): Payer: Self-pay

## 2011-07-10 ENCOUNTER — Encounter: Payer: Self-pay | Admitting: Emergency Medicine

## 2011-07-10 ENCOUNTER — Emergency Department (HOSPITAL_COMMUNITY)
Admission: EM | Admit: 2011-07-10 | Discharge: 2011-07-10 | Disposition: A | Discharge status: Home / Self Care | Payer: Self-pay | Attending: Emergency Medicine | Admitting: Emergency Medicine

## 2011-07-10 ENCOUNTER — Other Ambulatory Visit: Payer: Self-pay

## 2011-07-10 DIAGNOSIS — R5381 Other malaise: Secondary | ICD-10-CM | POA: Insufficient documentation

## 2011-07-10 DIAGNOSIS — D649 Anemia, unspecified: Secondary | ICD-10-CM

## 2011-07-10 DIAGNOSIS — R531 Weakness: Secondary | ICD-10-CM

## 2011-07-10 DIAGNOSIS — I10 Essential (primary) hypertension: Secondary | ICD-10-CM | POA: Insufficient documentation

## 2011-07-10 HISTORY — DX: Essential (primary) hypertension: I10

## 2011-07-10 LAB — CBC
MCHC: 33.4 g/dL (ref 30.0–36.0)
RDW: 13.1 % (ref 11.5–15.5)

## 2011-07-10 LAB — URINALYSIS, ROUTINE W REFLEX MICROSCOPIC
Glucose, UA: NEGATIVE mg/dL
Ketones, ur: NEGATIVE mg/dL
Leukocytes, UA: NEGATIVE
Nitrite: NEGATIVE
Protein, ur: NEGATIVE mg/dL
Urobilinogen, UA: 0.2 mg/dL (ref 0.0–1.0)

## 2011-07-10 LAB — DIFFERENTIAL
Basophils Absolute: 0 10*3/uL (ref 0.0–0.1)
Basophils Relative: 0 % (ref 0–1)
Eosinophils Absolute: 0 10*3/uL (ref 0.0–0.7)
Monocytes Absolute: 0.2 10*3/uL (ref 0.1–1.0)
Neutro Abs: 2.6 10*3/uL (ref 1.7–7.7)
Neutrophils Relative %: 63 % (ref 43–77)

## 2011-07-10 LAB — CARDIAC PANEL(CRET KIN+CKTOT+MB+TROPI)
CK, MB: 3.4 ng/mL (ref 0.3–4.0)
Relative Index: 2.8 — ABNORMAL HIGH (ref 0.0–2.5)

## 2011-07-10 LAB — COMPREHENSIVE METABOLIC PANEL
AST: 19 U/L (ref 0–37)
Albumin: 3.3 g/dL — ABNORMAL LOW (ref 3.5–5.2)
Chloride: 104 mEq/L (ref 96–112)
Creatinine, Ser: 0.98 mg/dL (ref 0.50–1.35)
Potassium: 5 mEq/L (ref 3.5–5.1)
Total Bilirubin: 0.2 mg/dL — ABNORMAL LOW (ref 0.3–1.2)
Total Protein: 9.6 g/dL — ABNORMAL HIGH (ref 6.0–8.3)

## 2011-07-10 NOTE — ED Provider Notes (Signed)
History     CSN: [id number]  Arrival date & time 07/10/11  5621   First MD Initiated Contact with Patient 07/10/11 0932      Chief Complaint  Patient presents with  . Weakness    (Consider location/radiation/quality/duration/timing/severity/associated sxs/prior treatment) HPI Comments: Patient states history of "blood cancer" diagnosed by bone marrow test 1 1/2 years ago.  Unsure of how this was treated, but was doing better until about three months ago.  He started again feeling weak, no energy, less ability to do activities of daily living.  Denies fevers or chills.  No pain.  This is how he felt when the blood cancer was diagnosed.    Patient is a 60 y.o. male presenting with weakness. The history is provided by the patient.  Weakness Primary symptoms do not include headaches, syncope, seizures, dizziness, focal weakness, loss of sensation or fever.  Additional symptoms include weakness.    Past Medical History  Diagnosis Date  . Cancer   . Hypertension     History reviewed. No pertinent past surgical history.  History reviewed. No pertinent family history.  History  Substance Use Topics  . Smoking status: Never Smoker   . Smokeless tobacco: Not on file  . Alcohol Use: No      Review of Systems  Constitutional: Positive for fatigue. Negative for fever, appetite change and unexpected weight change.  Cardiovascular: Negative for syncope.  Neurological: Positive for weakness. Negative for dizziness, focal weakness, seizures and headaches.  All other systems reviewed and are negative.    Allergies  Review of patient's allergies indicates no known allergies.  Home Medications   Current Outpatient Rx  Name Route Sig Dispense Refill  . LOSARTAN POTASSIUM 100 MG PO TABS Oral Take 100 mg by mouth daily.        BP 149/87  Pulse 77  Temp(Src) 97.9 F (36.6 C) (Oral)  Resp 19  SpO2 100%  Physical Exam  Nursing note and vitals reviewed. Constitutional: He  is oriented to person, place, and time. He appears well-developed and well-nourished. No distress.  HENT:  Head: Normocephalic and atraumatic.  Mouth/Throat: Oropharynx is clear and moist. No oropharyngeal exudate.  Eyes: EOM are normal. Pupils are equal, round, and reactive to light.  Neck: Normal range of motion. Neck supple.  Cardiovascular: Normal rate, regular rhythm and normal heart sounds.   No murmur heard. Pulmonary/Chest: Effort normal and breath sounds normal. No respiratory distress. He has no wheezes.  Abdominal: Soft. Bowel sounds are normal. He exhibits no distension. There is no tenderness.  Musculoskeletal: Normal range of motion. He exhibits no edema.  Lymphadenopathy:    He has no cervical adenopathy.  Neurological: He is oriented to person, place, and time. No cranial nerve deficit.  Skin: Skin is warm and dry. He is not diaphoretic.    ED Course  Procedures (including critical care time)   Labs Reviewed  CBC  DIFFERENTIAL  COMPREHENSIVE METABOLIC PANEL  CARDIAC PANEL(CRET [name]+CKTOT+MB+TROPI)  URINALYSIS, ROUTINE W REFLEX MICROSCOPIC   No results found.   No diagnosis found.   Date: 07/10/2011  Rate: 52  Rhythm: sinus bradycardia  QRS Axis: normal  Intervals: normal  ST/T Wave abnormalities: normal  Conduction Disutrbances:none  Narrative Interpretation:   Old EKG Reviewed: none available    MDM  Patient complains of several month history of weakness.   He has a history of some sort of blood issue that he has seen oncology for.  I am unsure as to  what the underlying diagnosis is. The labs show a mild anemia of Hb 10.9 which has been his baseline for quite some.  He denies any bloody or tarry stool and has had a colonoscopy in the not-too-distant past.   Will discharge with follow up with oncology if not improving.          Geoffery Lyons, MD 07/10/11 [phone]

## 2011-07-10 NOTE — ED Notes (Signed)
Pt c/o generalized weakness x several months; pt sts had cancer 1 year ago and was concerned and wanted to be evaluated

## 2011-09-14 ENCOUNTER — Encounter (HOSPITAL_COMMUNITY): Payer: Self-pay | Admitting: *Deleted

## 2011-09-14 ENCOUNTER — Emergency Department (HOSPITAL_COMMUNITY)
Admission: EM | Admit: 2011-09-14 | Discharge: 2011-09-14 | Disposition: A | Discharge status: Home Health | Payer: Self-pay | Attending: Emergency Medicine | Admitting: Emergency Medicine

## 2011-09-14 DIAGNOSIS — M79606 Pain in leg, unspecified: Secondary | ICD-10-CM

## 2011-09-14 DIAGNOSIS — Z79899 Other long term (current) drug therapy: Secondary | ICD-10-CM | POA: Insufficient documentation

## 2011-09-14 DIAGNOSIS — M79609 Pain in unspecified limb: Secondary | ICD-10-CM | POA: Insufficient documentation

## 2011-09-14 DIAGNOSIS — I10 Essential (primary) hypertension: Secondary | ICD-10-CM | POA: Insufficient documentation

## 2011-09-14 MED ORDER — HYDROCODONE-ACETAMINOPHEN 5-500 MG PO TABS: 1.0000 | ORAL_TABLET | Freq: Four times a day (QID) | ORAL | Status: AC | PRN

## 2011-09-14 NOTE — ED Notes (Addendum)
C/o chronic leg pain for years, worse recently. Intensity fluctuates. Worse in the cold & when walking. Denies pain or other sx at this time. "feels like hip gave out earlier".

## 2011-09-14 NOTE — ED Provider Notes (Signed)
History     CSN: [id number]  Arrival date & time 09/14/11  4098   First MD Initiated Contact with Patient 09/14/11 0532      Chief Complaint  Patient presents with  . Leg Pain    right    (Consider location/radiation/quality/duration/timing/severity/associated sxs/prior treatment) HPI Comments: Has had "chronic pain" in the right leg and back for years.  Flares up from time to time, due to weather, overuse, etc.  Worse the past few days.  No new injury or trauma.  No bowel or bladder complaints.    The history is provided by the patient.    Past Medical History  Diagnosis Date  . Cancer   . Hypertension     History reviewed. No pertinent past surgical history.  Family History  Problem Relation Age of Onset  . Heart failure Mother   . Hypertension Mother   . Heart failure Brother   . Hypertension Brother     History  Substance Use Topics  . Smoking status: Never Smoker   . Smokeless tobacco: Not on file  . Alcohol Use: No      Review of Systems  All other systems reviewed and are negative.    Allergies  Review of patient's allergies indicates no known allergies.  Home Medications   Current Outpatient Rx  Name Route Sig Dispense Refill  . LOSARTAN POTASSIUM 100 MG PO TABS Oral Take 100 mg by mouth daily.        BP 148/82  Pulse 53  Temp 98.2 F (36.8 C)  Resp 19  SpO2 99%  Physical Exam  Nursing note and vitals reviewed. Constitutional: He is oriented to person, place, and time. He appears well-developed and well-nourished. No distress.  HENT:  Head: Normocephalic and atraumatic.  Neck: Normal range of motion. Neck supple.  Musculoskeletal: Normal range of motion.  Neurological: He is alert and oriented to person, place, and time.       The RLE appears grossly normal.  There is no deformity.  Strength is 5/5 in ble, DTR's are 1+ and equal in the ble.  Ambulates without difficulty or deficit.  Skin: Skin is warm and dry. He is not diaphoretic.      ED Course  Procedures (including critical care time)  Labs Reviewed - No data to display No results found.   No diagnosis found.    MDM  Will prescribe pain meds.  Needs to see pcp to discuss if not improving.        Geoffery Lyons, MD 09/14/11 [phone]

## 2011-09-14 NOTE — Discharge Instructions (Signed)
Pain of Unknown Etiology (Pain Without a Known Cause) You have come to your caregiver because of pain. Pain can occur in any part of the body. Often there is not a definite cause. If your laboratory (blood or urine) work was normal and x-rays or other studies were normal, your caregiver may treat you without knowing the cause of the pain. An example of this is the headache. Most headaches are diagnosed by taking a history. This means your caregiver asks you questions about your headaches. Your caregiver determines a treatment based on your answers. Usually testing done for headaches is normal. Often testing is not done unless there is no response to medications. Regardless of where your pain is located today, you can be given medications to make you comfortable. If no physical cause of pain can be found, most cases of pain will gradually leave as suddenly as they came.  If you have a painful condition and no reason can be found for the pain, It is importantthat you follow up with your caregiver. If the pain becomes worse or does not go away, it may be necessary to repeat tests and look further for a possible cause.  Only take over-the-counter or prescription medicines for pain, discomfort, or fever as directed by your caregiver.   For the protection of your privacy, test results can not be given over the phone. Make sure you receive the results of your test. Ask as to how these results are to be obtained if you have not been informed. It is your responsibility to obtain your test results.   You may continue all activities unless the activities cause more pain. When the pain lessens, it is important to gradually resume normal activities. Resume activities by beginning slowly and gradually increasing the intensity and duration of the activities or exercise. During periods of severe pain, bed-rest may be helpful. Lay or sit in any position that is comfortable.   Ice used for acute (sudden) conditions may be  effective. Use a large plastic bag filled with ice and wrapped in a towel. This may provide pain relief.   See your caregiver for continued problems. They can help or refer you for exercises or physical therapy if necessary.  If you were given medications for your condition, do not drive, operate machinery or power tools, or sign legal documents for 24 hours. Do not drink alcohol, take sleeping pills, or take other medications that may interfere with treatment. See your caregiver immediately if you have pain that is becoming worse and not relieved by medications. Document Released: 03/26/2001 Document Revised: 03/13/2011 Document Reviewed: 07/01/2005 Exit[employer] Patient Information 2012 [employer]. 

## 2011-09-14 NOTE — ED Notes (Signed)
Pt states he has an appointment with [facility] on the 15 th for chronic arthritic pain, but the pain is getting too great, so he decided to come here for pain relief.  Full rom of LE.

## 2011-10-14 ENCOUNTER — Emergency Department (HOSPITAL_COMMUNITY): Payer: Self-pay

## 2011-10-14 ENCOUNTER — Emergency Department (HOSPITAL_COMMUNITY)
Admission: EM | Admit: 2011-10-14 | Discharge: 2011-10-14 | Disposition: A | Discharge status: Home / Self Care | Payer: Self-pay | Attending: Emergency Medicine | Admitting: Emergency Medicine

## 2011-10-14 ENCOUNTER — Encounter (HOSPITAL_COMMUNITY): Payer: Self-pay | Admitting: Emergency Medicine

## 2011-10-14 DIAGNOSIS — G8929 Other chronic pain: Secondary | ICD-10-CM | POA: Insufficient documentation

## 2011-10-14 DIAGNOSIS — M545 Low back pain, unspecified: Secondary | ICD-10-CM | POA: Insufficient documentation

## 2011-10-14 DIAGNOSIS — M79609 Pain in unspecified limb: Secondary | ICD-10-CM | POA: Insufficient documentation

## 2011-10-14 DIAGNOSIS — Z859 Personal history of malignant neoplasm, unspecified: Secondary | ICD-10-CM | POA: Insufficient documentation

## 2011-10-14 DIAGNOSIS — Z79899 Other long term (current) drug therapy: Secondary | ICD-10-CM | POA: Insufficient documentation

## 2011-10-14 DIAGNOSIS — M549 Dorsalgia, unspecified: Secondary | ICD-10-CM

## 2011-10-14 DIAGNOSIS — I10 Essential (primary) hypertension: Secondary | ICD-10-CM | POA: Insufficient documentation

## 2011-10-14 MED ORDER — HYDROCODONE-ACETAMINOPHEN 5-325 MG PO TABS: 1.0000 | ORAL_TABLET | ORAL | Status: AC | PRN

## 2011-10-14 MED ORDER — HYDROCODONE-ACETAMINOPHEN 5-325 MG PO TABS
1.0000 | ORAL_TABLET | Freq: Once | ORAL | Status: AC
  Administered 2011-10-14: 1 via ORAL
  Filled 2011-10-14: qty 1

## 2011-10-14 NOTE — Discharge Instructions (Signed)
Chronic Back Pain When back pain lasts longer than 3 months, it is called chronic back pain.This pain can be frustrating, but the cause of the pain is rarely dangerous.People with chronic back pain often go through certain periods that are more intense (flare-ups). CAUSES Chronic back pain can be caused by wear and tear (degeneration) on different structures in your back. These structures may include bones, ligaments, or discs. This degeneration may result in more pressure being placed on the nerves that travel to your legs and feet. This can lead to pain traveling from the low back down the back of the legs. When pain lasts longer than 3 months, it is not unusual for people to experience anxiety or depression. Anxiety and depression can also contribute to low back pain. TREATMENT  Establish a regular exercise plan. This is critical to improving your functional level.   Have a self-management plan for when you flare-up. Flare-ups rarely require a medical visit. Regular exercise will help reduce the intensity and frequency of your flare-ups.   Manage how you feel about your back pain and the rest of your life. Anxiety, depression, and feeling that you cannot alter your back pain have been shown to make back pain more intense and debilitating.   Medicines should never be your only treatment. They should be used along with other treatments to help you return to a more active lifestyle.   Procedures such as injections or surgery may be helpful but are rarely necessary. You may be able to get the same results with physical therapy or chiropractic care.  HOME CARE INSTRUCTIONS  Avoid bending, heavy lifting, prolonged sitting, and activities which make the problem worse.   Continue normal activity as much as possible.   Take brief periods of rest throughout the day to reduce your pain during flare-ups.   Follow your back exercise rehabilitation program. This can help reduce symptoms and prevent  more pain.   Only take over-the-counter or prescription medicines as directed by your caregiver. Muscle relaxants are sometimes prescribed. Narcotic pain medicine is discouraged for long-term pain, since addiction is a possible outcome.   If you smoke, quit.   Eat healthy foods and maintain a recommended body weight.  SEEK IMMEDIATE MEDICAL CARE IF:   You have weakness or numbness in one of your legs or feet.   You have trouble controlling your bladder or bowels.   You develop nausea, vomiting, abdominal pain, shortness of breath, or fainting.  Document Released: 08/08/2004 Document Revised: 06/20/2011 Document Reviewed: 06/15/2011 Exit[employer] Patient Information 2012 [employer]. 

## 2011-10-14 NOTE — ED Notes (Signed)
Pt. Stated, I'm suppose to go to [facility] and the Vicodin works for my back pain. I've had this back pain for years

## 2011-10-14 NOTE — ED Provider Notes (Signed)
Medical screening examination/treatment/procedure(s) were performed by non-physician practitioner and as supervising physician I was immediately available for consultation/collaboration.   Boni Maclellan A Marsh Heckler, MD 10/14/11 1637 

## 2011-10-14 NOTE — ED Notes (Signed)
Reports chronic lower back and (R) leg pain.  Increased today.  Dx with arthritis.  Denies injury.  Pt ambulatory without difficulty.

## 2011-10-14 NOTE — ED Provider Notes (Signed)
History     CSN: [id number]  Arrival date & time 10/14/11  0820   First MD Initiated Contact with Patient 10/14/11 [phone]      Chief Complaint  Patient presents with  . Back Pain    (Consider location/radiation/quality/duration/timing/severity/associated sxs/prior treatment) Patient is a 61 y.o. male presenting with back pain. The history is provided by the patient.  Back Pain  This is a chronic problem. The current episode started more than 1 week ago. The problem occurs constantly. The problem has not changed since onset.The pain is associated with no known injury. The pain is present in the lumbar spine. The quality of the pain is described as shooting. Radiates to: right leg. The pain is severe. The symptoms are aggravated by bending and twisting (walking). The pain is the same all the time. Pertinent negatives include no chest pain, no fever, no numbness, no weight loss, no abdominal pain, no abdominal swelling, no bowel incontinence, no perianal numbness, no bladder incontinence, no dysuria, no pelvic pain, no paresthesias, no paresis, no tingling and no weakness. Treatments tried: narcotics. The treatment provided significant relief. Risk factors include a history of cancer.    Past Medical History  Diagnosis Date  . Cancer   . Hypertension     History reviewed. No pertinent past surgical history.  Family History  Problem Relation Age of Onset  . Heart failure Mother   . Hypertension Mother   . Heart failure Brother   . Hypertension Brother     History  Substance Use Topics  . Smoking status: Never Smoker   . Smokeless tobacco: Not on file  . Alcohol Use: No      Review of Systems  Constitutional: Negative for fever and weight loss.  Cardiovascular: Negative for chest pain.  Gastrointestinal: Negative for abdominal pain and bowel incontinence.  Genitourinary: Negative for bladder incontinence, dysuria and pelvic pain.  Musculoskeletal: Positive for back pain.    Neurological: Negative for tingling, weakness, numbness and paresthesias.  All other systems reviewed and are negative.    Allergies  Review of patient's allergies indicates no known allergies.  Home Medications   Current Outpatient Rx  Name Route Sig Dispense Refill  . LOSARTAN POTASSIUM 100 MG PO TABS Oral Take 100 mg by mouth daily.        BP 140/80  Pulse 84  Temp(Src) 99.9 F (37.7 C) (Oral)  Resp 16  SpO2 100%  Physical Exam  Nursing note and vitals reviewed. Constitutional: He is oriented to person, place, and time. He appears well-developed and well-nourished. No distress.  HENT:  Head: Normocephalic and atraumatic.  Right Ear: External ear normal.  Left Ear: External ear normal.  Eyes: Pupils are equal, round, and reactive to light.  Neck: Normal range of motion. Neck supple.  Cardiovascular: Normal rate and regular rhythm.   Pulmonary/Chest: Effort normal. No respiratory distress.  Abdominal: Soft. He exhibits no distension. There is no tenderness.  Musculoskeletal: Normal range of motion. He exhibits no edema.       Right lumbar back pain elicited with both active and passive straight leg raise on same side. No midline tenderness over c t l spine. Pain to right paralumbar regionand over right sacrum. No deformity or stepoff. Gait with slight antalgia but no ataxia. Strength in BLE 5/5 and symmetric in major muscle groups.  Neurological: He is alert and oriented to person, place, and time. No cranial nerve deficit. Sensory deficit: sensation intact to light touch and symmetric in  BLE. Coordination normal. GCS eye subscore is 4. GCS verbal subscore is 5. GCS motor subscore is 6.  Reflex Scores:      Patellar reflexes are 2+ on the right side and 2+ on the left side. Skin: Skin is warm and dry. No rash noted.  Psychiatric: He has a normal mood and affect.    ED Course  Procedures (including critical care time)  Labs Reviewed - No data to display Dg Lumbar  Spine Complete  10/14/2011  *RADIOLOGY REPORT*  Clinical Data: Low back pain with radiation into the right leg.  LUMBAR SPINE - COMPLETE 4+ VIEW  Comparison: 04/20/2011.  Findings: There are five lumbar vertebra.  Degenerative disc space narrowing is present the L5 -S1 level - unchanged.  There is milder degenerative disc space narrowing present at L4-5 level.  There are no fractures, subluxations, or destructive changes.  IMPRESSION: L5 -S1 and to a lesser extent L4-L5 degenerative disc space narrowing.  Otherwise, negative study.  Original Report Authenticated By: Rolla Plate, M.D.   Dg Sacrum/coccyx  10/14/2011  *RADIOLOGY REPORT*  Clinical Data:   Chronic low back pain.  Cancer.  No injury.  SACRUM AND COCCYX - 2+ VIEW  Comparison: 04/20/2011.  Findings: Moderate bilateral hip joint degenerative changes. Femoral head subchondral cystic changes and sclerosis greater on the right.  Prominent degenerative changes L5-S1.  No obvious metastatic disease.  Evaluation limited by overlying bowel gas and the above described degenerative changes.  IMPRESSION: Moderate bilateral hip joint degenerative changes.  Femoral head subchondral cystic changes and sclerosis greater on the right.  Prominent degenerative changes L5-S1.  Please see above.  Original Report Authenticated By: Fuller Canada, M.D.     Dx 1: Chronic back pain   MDM  Chronic back pain with no recent imaging studies. X-rays reviewed without evidence of metastatic lesions or other acute findings. I will give pt a short course of pain medication but have stressed the importance of primary care doctor follow-up for definitive management of chronic pain. He voices understanding.        Shaaron Adler, PA-C 10/14/11 1004

## 2012-04-02 ENCOUNTER — Encounter (HOSPITAL_COMMUNITY): Payer: Self-pay | Admitting: *Deleted

## 2012-04-02 ENCOUNTER — Emergency Department (HOSPITAL_COMMUNITY)
Admission: EM | Admit: 2012-04-02 | Discharge: 2012-04-02 | Disposition: A | Discharge status: Home / Self Care | Payer: Self-pay | Attending: Emergency Medicine | Admitting: Emergency Medicine

## 2012-04-02 DIAGNOSIS — M545 Low back pain, unspecified: Secondary | ICD-10-CM | POA: Insufficient documentation

## 2012-04-02 DIAGNOSIS — Z79899 Other long term (current) drug therapy: Secondary | ICD-10-CM | POA: Insufficient documentation

## 2012-04-02 DIAGNOSIS — M549 Dorsalgia, unspecified: Secondary | ICD-10-CM

## 2012-04-02 DIAGNOSIS — M79609 Pain in unspecified limb: Secondary | ICD-10-CM | POA: Insufficient documentation

## 2012-04-02 DIAGNOSIS — G8929 Other chronic pain: Secondary | ICD-10-CM | POA: Insufficient documentation

## 2012-04-02 DIAGNOSIS — I10 Essential (primary) hypertension: Secondary | ICD-10-CM | POA: Insufficient documentation

## 2012-04-02 MED ORDER — DIAZEPAM 5 MG PO TABS: 5.0000 mg | ORAL_TABLET | Freq: Four times a day (QID) | ORAL | Status: DC | PRN

## 2012-04-02 MED ORDER — LOSARTAN POTASSIUM 100 MG PO TABS: 100.0000 mg | ORAL_TABLET | Freq: Every day | ORAL | Status: DC

## 2012-04-02 MED ORDER — OXYCODONE-ACETAMINOPHEN 5-325 MG PO TABS
1.0000 | ORAL_TABLET | Freq: Once | ORAL | Status: AC
Start: 2012-04-02 — End: 2012-04-02
  Administered 2012-04-02: 1 via ORAL
  Filled 2012-04-02: qty 1

## 2012-04-02 MED ORDER — OXYCODONE-ACETAMINOPHEN 5-325 MG PO TABS
1.0000 | ORAL_TABLET | Freq: Four times a day (QID) | ORAL | Status: DC | PRN
Start: 2012-04-02 — End: 2012-04-25

## 2012-04-02 NOTE — ED Notes (Signed)
Patient with chronic history of back pain and right leg pain and has been taking medications for it--OTC (advil, ibuprofen) without relief.  Patient states that he thinks he needs stronger medication.  Patient able to walk on that leg but it hurst a lot

## 2012-04-02 NOTE — ED Provider Notes (Signed)
History  Scribed for No att. providers found, the patient was seen in room TR04C/TR04C. This chart was scribed by Candelaria Stagers. The patient's care started at 8:08 PM   CSN: [id number]  Arrival date & time 04/02/12  1517   First MD Initiated Contact with Patient 04/02/12 1535      Chief Complaint  Patient presents with  . Leg Pain    The history is provided by the patient. No language interpreter was used.   Travis Nguyen is a 61 y.o. male who presents to the Emergency Department complaining of right leg pain that is associated with chronic back pain that is now worsening.  He states that he is now having trouble walking due to the pain.  He has taken OTC medications for the pain with no relief.  He denies loss of bowels or bladder.  He has a h/o back pain.  Pt is a former pt of [facility].  Past Medical History  Diagnosis Date  . Cancer   . Hypertension     History reviewed. No pertinent past surgical history.  Family History  Problem Relation Age of Onset  . Heart failure Mother   . Hypertension Mother   . Heart failure Brother   . Hypertension Brother     History  Substance Use Topics  . Smoking status: Never Smoker   . Smokeless tobacco: Not on file  . Alcohol Use: No      Review of Systems  Genitourinary: Negative for difficulty urinating.  Musculoskeletal: Positive for back pain and arthralgias (right leg pain).  All other systems reviewed and are negative.    Allergies  Review of patient's allergies indicates no known allergies.  Home Medications   Current Outpatient Rx  Name Route Sig Dispense Refill  . TYLENOL PO Oral Take 2 tablets by mouth every 6 (six) hours as needed. For pain    . DIAZEPAM 5 MG PO TABS Oral Take 1 tablet (5 mg total) by mouth every 6 (six) hours as needed (spasm). 10 tablet 0  . LOSARTAN POTASSIUM 100 MG PO TABS Oral Take 1 tablet (100 mg total) by mouth daily. 30 tablet 0  . OXYCODONE-ACETAMINOPHEN 5-325 MG PO TABS  Oral Take 1-2 tablets by mouth every 6 (six) hours as needed for pain. 15 tablet 0    BP 148/79  Pulse 54  Temp 98.2 F (36.8 C) (Oral)  Resp 16  SpO2 100%  Physical Exam  Nursing note and vitals reviewed. Constitutional: He is oriented to person, place, and time. He appears well-developed and well-nourished. No distress.  HENT:  Head: Normocephalic and atraumatic.  Eyes: EOM are normal. Right eye exhibits no discharge. Left eye exhibits no discharge.  Neck: Normal range of motion. No tracheal deviation present.  Pulmonary/Chest: Effort normal. No respiratory distress.  Abdominal: He exhibits no mass. There is no tenderness.  Musculoskeletal: He exhibits no edema.       Right lower lumbar para spinal tenderness.  Neurovascularly intact.  No weakness of lower extremities.   Neurological: He is alert and oriented to person, place, and time.  Skin: Skin is warm and dry. He is not diaphoretic.  Psychiatric: He has a normal mood and affect. His behavior is normal.    ED Course  Procedures  DIAGNOSTIC STUDIES: Oxygen Saturation is 100% on room air, normal by my interpretation.    COORDINATION OF CARE:     Labs Reviewed - No data to display No results found.   1.  Back pain       MDM  Patient with chronic back pain. Slightly worse recently. it radiates down his right leg.no red flags. No weakness. He was given pain medicines and will followup. He was also given a refill of his blood pressure medicine since his Dr. Is no longer seeing patients  I personally performed the services described in this documentation, which was scribed in my presence. The recorded information has been reviewed and considered.        Juliet Rude. Rubin Payor, MD 04/02/12 2009

## 2012-04-25 ENCOUNTER — Emergency Department (HOSPITAL_COMMUNITY): Payer: Self-pay

## 2012-04-25 ENCOUNTER — Encounter (HOSPITAL_COMMUNITY): Payer: Self-pay | Admitting: Emergency Medicine

## 2012-04-25 ENCOUNTER — Emergency Department (HOSPITAL_COMMUNITY)
Admission: EM | Admit: 2012-04-25 | Discharge: 2012-04-25 | Disposition: A | Discharge status: Home / Self Care | Payer: Self-pay | Attending: Emergency Medicine | Admitting: Emergency Medicine

## 2012-04-25 DIAGNOSIS — Z8489 Family history of other specified conditions: Secondary | ICD-10-CM | POA: Insufficient documentation

## 2012-04-25 DIAGNOSIS — M549 Dorsalgia, unspecified: Secondary | ICD-10-CM | POA: Insufficient documentation

## 2012-04-25 DIAGNOSIS — Z8249 Family history of ischemic heart disease and other diseases of the circulatory system: Secondary | ICD-10-CM | POA: Insufficient documentation

## 2012-04-25 DIAGNOSIS — Z859 Personal history of malignant neoplasm, unspecified: Secondary | ICD-10-CM | POA: Insufficient documentation

## 2012-04-25 DIAGNOSIS — I10 Essential (primary) hypertension: Secondary | ICD-10-CM | POA: Insufficient documentation

## 2012-04-25 MED ORDER — DIAZEPAM 5 MG PO TABS: 5.0000 mg | ORAL_TABLET | Freq: Four times a day (QID) | ORAL | Status: DC | PRN

## 2012-04-25 MED ORDER — OXYCODONE-ACETAMINOPHEN 5-325 MG PO TABS: ORAL_TABLET | ORAL | Status: DC

## 2012-04-25 MED ORDER — OXYCODONE-ACETAMINOPHEN 5-325 MG PO TABS
2.0000 | ORAL_TABLET | Freq: Once | ORAL | Status: AC
  Administered 2012-04-25: 2 via ORAL
  Filled 2012-04-25: qty 2

## 2012-04-25 NOTE — ED Notes (Signed)
Pt presents to ed via pov with c/o right leg and low back pain. NAD .

## 2012-04-25 NOTE — ED Provider Notes (Signed)
History     CSN: [id number]  Arrival date & time 04/25/12  0808   First MD Initiated Contact with Patient 04/25/12 [phone]      Chief Complaint  Patient presents with  . Back Pain    (Consider location/radiation/quality/duration/timing/severity/associated sxs/prior treatment) HPI  61 y.o. male iNAD c/o exacerbation to chronic low back pain that radiates down both legs. Pt recently ran out of pain Rx. Pt has very physical work as a Copy. Patient used [facility] but he no longer has primary care. Patient denies any change in bowel or bladder habits, change in character or severity of pain, history of IV drug use or fever, patient has history of  "Blood cancer" in remission x3 years.  Past Medical History  Diagnosis Date  . Cancer   . Hypertension     History reviewed. No pertinent past surgical history.  Family History  Problem Relation Age of Onset  . Heart failure Mother   . Hypertension Mother   . Heart failure Brother   . Hypertension Brother     History  Substance Use Topics  . Smoking status: Never Smoker   . Smokeless tobacco: Not on file  . Alcohol Use: No      Review of Systems  Constitutional: Negative for fever.  Respiratory: Negative for shortness of breath.   Cardiovascular: Negative for chest pain.  Gastrointestinal: Negative for nausea, vomiting, abdominal pain and diarrhea.  Musculoskeletal: Positive for back pain.  All other systems reviewed and are negative.    Allergies  Review of patient's allergies indicates no known allergies.  Home Medications   Current Outpatient Rx  Name Route Sig Dispense Refill  . DIAZEPAM 5 MG PO TABS Oral Take 5 mg by mouth every 6 (six) hours as needed. For spasms    . LOSARTAN POTASSIUM 100 MG PO TABS Oral Take 100 mg by mouth daily.    . OXYCODONE-ACETAMINOPHEN 5-325 MG PO TABS Oral Take 1-2 tablets by mouth every 6 (six) hours as needed. For pain      BP 157/84  Pulse 57  Temp 98.3 F (36.8 C)  (Oral)  Resp 16  SpO2 99%  Physical Exam  Nursing note and vitals reviewed. Constitutional: He is oriented to person, place, and time. He appears well-developed and well-nourished. No distress.  HENT:  Head: Normocephalic.  Eyes: Conjunctivae normal and EOM are normal. Pupils are equal, round, and reactive to light.  Cardiovascular: Normal rate.   Pulmonary/Chest: Effort normal and breath sounds normal. No stridor. No respiratory distress. He has no wheezes. He exhibits no tenderness.  Abdominal: Soft. Bowel sounds are normal.  Musculoskeletal: Normal range of motion.       Diffuse tenderness to palpation of right lumbar paraspinal musculature. Straight leg raise positive on the right side negative on the contralateral side. Distal sensation intact bilaterally  Neurological: He is alert and oriented to person, place, and time.  Psychiatric: He has a normal mood and affect.    ED Course  Procedures (including critical care time)  Labs Reviewed - No data to display Dg Lumbar Spine Complete  04/25/2012  *RADIOLOGY REPORT*  Clinical Data: Low back pain.  Previous history of cancer  LUMBAR SPINE - COMPLETE 4+ VIEW  Comparison: 10/14/2011  Findings: No evidence of acute fracture, spondylolysis, or spondylolisthesis.  No focal lytic or sclerotic bone lesions are identified.  Mild degenerative disc disease is again seen at L5-S1.  Mild bilateral facet DJD also seen at this level.  IMPRESSION:  1.  No acute findings. 2.  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   Original Report Authenticated By: Danae Orleans, M.D.      1. Back pain       MDM  XR ordered secondary to h/o Ca. Pt is trying to establish out Pt care for chronic pain.  New Prescriptions   DIAZEPAM (VALIUM) 5 MG TABLET    Take 1 tablet (5 mg total) by mouth every 6 (six) hours as needed for anxiety (spasms).   OXYCODONE-ACETAMINOPHEN (PERCOCET/ROXICET) 5-325 MG PER TABLET    1 to 2 tabs PO q6hrs  PRN for pain          Wynetta Emery, PA-C 04/25/12 1536

## 2012-04-25 NOTE — ED Notes (Signed)
Patient discharged to home with family.NAD noted. Pt voiced understanding of discharge instructions .

## 2012-04-26 NOTE — ED Provider Notes (Signed)
Medical screening examination/treatment/procedure(s) were performed by non-physician practitioner and as supervising physician I was immediately available for consultation/collaboration.  Ethelda Chick, MD 04/26/12 [phone]

## 2012-05-01 ENCOUNTER — Encounter (HOSPITAL_COMMUNITY): Payer: Self-pay

## 2012-05-01 ENCOUNTER — Emergency Department (INDEPENDENT_AMBULATORY_CARE_PROVIDER_SITE_OTHER)
Admission: EM | Admit: 2012-05-01 | Discharge: 2012-05-01 | Disposition: A | Discharge status: Home / Self Care | Payer: No Typology Code available for payment source | Source: Home / Self Care

## 2012-05-01 DIAGNOSIS — M545 Low back pain, unspecified: Secondary | ICD-10-CM

## 2012-05-01 DIAGNOSIS — G8929 Other chronic pain: Secondary | ICD-10-CM

## 2012-05-01 MED ORDER — TRAMADOL HCL 50 MG PO TABS: 100.0000 mg | ORAL_TABLET | Freq: Four times a day (QID) | ORAL | Status: DC | PRN

## 2012-05-01 NOTE — ED Provider Notes (Signed)
History     CSN: [id number]  Arrival date & time 05/01/12  1528   First MD Initiated Contact with Patient 05/01/12 1536      Chief Complaint  Patient presents with  . Back Pain     HPI Comes in for continued lower back pain- asking if there is a more permanent solution other than pain medications- asking for a shot that will last 2-3 months. Pain is in lumbar area, worse when he wakes up in the morning, exacerbated by bending forward. Pain often shoots down his legs but this does not last for long. No issues with loss of bowel or bladder control. Explains to me that the Percocets he was prescribed by the ER made him extremely constipated and "messed with his head" and  he would like a pain relieving medication which will not do that  Past Medical History  Diagnosis Date  . Cancer   . Hypertension     History reviewed. No pertinent past surgical history.  Family History  Problem Relation Age of Onset  . Heart failure Mother   . Hypertension Mother   . Heart failure Brother   . Hypertension Brother     History  Substance Use Topics  . Smoking status: Never Smoker   . Smokeless tobacco: Not on file  . Alcohol Use: No      Review of Systems  Constitutional: Negative.   HENT: Negative.   Eyes: Negative.   Respiratory: Negative.   Cardiovascular: Negative.   Gastrointestinal:       Constipation from Percocet  Genitourinary: Negative.   Musculoskeletal: Positive for back pain and gait problem.  Neurological: Negative.  Negative for numbness.  Hematological: Negative.   Psychiatric/Behavioral:       Depression when pain is severe    Allergies  Review of patient's allergies indicates no known allergies.  Home Medications   Current Outpatient Rx  Name Route Sig Dispense Refill  . DIAZEPAM 5 MG PO TABS Oral Take 1 tablet (5 mg total) by mouth every 6 (six) hours as needed for anxiety (spasms). 10 tablet 0  . LOSARTAN POTASSIUM 100 MG PO TABS Oral Take 100 mg by  mouth daily.    . OXYCODONE-ACETAMINOPHEN 5-325 MG PO TABS  1 to 2 tabs PO q6hrs  PRN for pain 15 tablet 0  . DIAZEPAM 5 MG PO TABS Oral Take 5 mg by mouth every 6 (six) hours as needed. For spasms    . OXYCODONE-ACETAMINOPHEN 5-325 MG PO TABS Oral Take 1-2 tablets by mouth every 6 (six) hours as needed. For pain    . TRAMADOL HCL 50 MG PO TABS Oral Take 2 tablets (100 mg total) by mouth every 6 (six) hours as needed for pain. 30 tablet 0    BP 121/69  Pulse 66  Temp 98.2 F (36.8 C) (Oral)  Resp 16  SpO2 100%  Physical Exam  Constitutional: He is oriented to person, place, and time. He appears well-developed and well-nourished.  HENT:  Head: Normocephalic and atraumatic.  Eyes: EOM are normal. Pupils are equal, round, and reactive to light.  Neck: Normal range of motion.  Cardiovascular: Normal rate.   Pulmonary/Chest: Effort normal and breath sounds normal.  Abdominal: Soft. Bowel sounds are normal.  Musculoskeletal: He exhibits no edema and no tenderness.       No current area of tenderness, unable to elicit pain currently with straight leg raising or with palpation of lumbar area.   Neurological: He is alert  and oriented to person, place, and time.  Skin: Skin is warm and dry.  Psychiatric: He has a normal mood and affect.    ED Course  Procedures (including critical care time)  Labs Reviewed - No data to display No results found.   1. Chronic low back pain       MDM  Tramadol PRN, will need referral from PCP to ortho- will need MRI.         Calvert Cantor, MD 05/01/12 1742

## 2012-05-01 NOTE — ED Notes (Signed)
Patient c/o chronic back pain for over 15 yrs, previous [facility] patient, states that he was at ED 10/12 was offered a shot he declined and wanted to see about getting the shot now, says the pills hurt his stomach

## 2012-07-24 ENCOUNTER — Telehealth: Payer: Self-pay | Admitting: Oncology

## 2012-07-24 NOTE — Telephone Encounter (Signed)
C/D 07/24/12 for appt2/3/14

## 2012-07-24 NOTE — Telephone Encounter (Signed)
Pt scheduled to see Dr. Arline Asp 02/03 @ 2:30 calendar will be mailed. # not in service.  Referring Dr. [name]  Dx-Monoclonal Gammopathy Calendar mailed.

## 2012-08-03 ENCOUNTER — Emergency Department (HOSPITAL_COMMUNITY)
Admission: EM | Admit: 2012-08-03 | Discharge: 2012-08-03 | Disposition: A | Discharge status: Home / Self Care | Payer: No Typology Code available for payment source | Attending: Emergency Medicine | Admitting: Emergency Medicine

## 2012-08-03 ENCOUNTER — Encounter (HOSPITAL_COMMUNITY): Payer: Self-pay | Admitting: Emergency Medicine

## 2012-08-03 ENCOUNTER — Emergency Department (HOSPITAL_COMMUNITY): Payer: No Typology Code available for payment source

## 2012-08-03 DIAGNOSIS — M545 Low back pain, unspecified: Secondary | ICD-10-CM | POA: Insufficient documentation

## 2012-08-03 DIAGNOSIS — R05 Cough: Secondary | ICD-10-CM | POA: Insufficient documentation

## 2012-08-03 DIAGNOSIS — R059 Cough, unspecified: Secondary | ICD-10-CM | POA: Insufficient documentation

## 2012-08-03 DIAGNOSIS — R52 Pain, unspecified: Secondary | ICD-10-CM | POA: Insufficient documentation

## 2012-08-03 DIAGNOSIS — D649 Anemia, unspecified: Secondary | ICD-10-CM | POA: Insufficient documentation

## 2012-08-03 DIAGNOSIS — Z8589 Personal history of malignant neoplasm of other organs and systems: Secondary | ICD-10-CM | POA: Insufficient documentation

## 2012-08-03 DIAGNOSIS — G8929 Other chronic pain: Secondary | ICD-10-CM | POA: Insufficient documentation

## 2012-08-03 DIAGNOSIS — Z79899 Other long term (current) drug therapy: Secondary | ICD-10-CM | POA: Insufficient documentation

## 2012-08-03 DIAGNOSIS — M549 Dorsalgia, unspecified: Secondary | ICD-10-CM

## 2012-08-03 DIAGNOSIS — I10 Essential (primary) hypertension: Secondary | ICD-10-CM | POA: Insufficient documentation

## 2012-08-03 LAB — BASIC METABOLIC PANEL
CO2: 25 mEq/L (ref 19–32)
Chloride: 101 mEq/L (ref 96–112)
Creatinine, Ser: 0.89 mg/dL (ref 0.50–1.35)
GFR calc Af Amer: >90 mL/min (ref >90)
Potassium: 4.6 mEq/L (ref 3.5–5.1)
Sodium: 131 mEq/L — ABNORMAL LOW (ref 135–145)

## 2012-08-03 LAB — OCCULT BLOOD, POC DEVICE: Fecal Occult Bld: NEGATIVE

## 2012-08-03 LAB — CBC WITH DIFFERENTIAL/PLATELET
Basophils Absolute: 0 10*3/uL (ref 0.0–0.1)
Basophils Relative: 0 % (ref 0–1)
HCT: 28 % — ABNORMAL LOW (ref 39.0–52.0)
Lymphocytes Relative: 52 % — ABNORMAL HIGH (ref 12–46)
MCHC: 33.2 g/dL (ref 30.0–36.0)
Neutro Abs: 1.2 10*3/uL — ABNORMAL LOW (ref 1.7–7.7)
Neutrophils Relative %: 43 % (ref 43–77)
Platelets: 194 10*3/uL (ref 150–400)
RDW: 12.7 % (ref 11.5–15.5)
WBC: 2.7 10*3/uL — ABNORMAL LOW (ref 4.0–10.5)

## 2012-08-03 MED ORDER — HYDROCODONE-ACETAMINOPHEN 5-325 MG PO TABS
1.0000 | ORAL_TABLET | Freq: Once | ORAL | Status: AC
  Administered 2012-08-03: 1 via ORAL
  Filled 2012-08-03: qty 1

## 2012-08-03 MED ORDER — IBUPROFEN 800 MG PO TABS
800.0000 mg | ORAL_TABLET | Freq: Once | ORAL | Status: AC
  Administered 2012-08-03: 800 mg via ORAL
  Filled 2012-08-03: qty 1

## 2012-08-03 MED ORDER — PERCOCET 5-325 MG PO TABS: 1.0000 | ORAL_TABLET | Freq: Four times a day (QID) | ORAL | Status: DC | PRN

## 2012-08-03 NOTE — ED Notes (Signed)
POCT OCCULT RESULTED NEGATIVE

## 2012-08-03 NOTE — ED Provider Notes (Signed)
Medical screening examination/treatment/procedure(s) were performed by non-physician practitioner and as supervising physician I was immediately available for consultation/collaboration.   Tema Alire, MD 08/03/12 1500 

## 2012-08-03 NOTE — ED Notes (Signed)
Care transferred and report given to Erika, RN. 

## 2012-08-03 NOTE — ED Provider Notes (Signed)
History     CSN: [id number]  Arrival date & time 08/03/12  0700   First MD Initiated Contact with Patient 08/03/12 [phone]      Chief Complaint  Patient presents with  . Back Pain    (Consider location/radiation/quality/duration/timing/severity/associated sxs/prior treatment) HPI Comments: Travis Nguyen is a 62 y.o. Male w a hx of chronic back pain & "blood CA" in remission for 3 years that presents to the ER c/o chronic back pain that has not worsened or had new trauma as below. Pain is in lumbar area, worse when he wakes up in the morning, exacerbated by bending forward. Pain often shoots down his legs but this does not last for long. No issues with loss of bowel or bladder control In addition pt has been  C/o of cold like s/s that started 3 weeks ago with only a lingering cough remaining. All other s/s of congestion, low grade fevers and myalgias have since resolved. Pt denies hemoptysis, fevers, night sweats, chills, or weight loss. He is na non smoker.   Patient is a 62 y.o. male presenting with back pain.  Back Pain  This is a chronic problem. The current episode started more than 1 week ago. The problem occurs daily. The problem has not changed since onset.The pain is associated with no known injury. The pain is present in the lumbar spine. The quality of the pain is described as shooting and aching. The pain is at a severity of 6/10. The pain is moderate. The symptoms are aggravated by certain positions. The pain is the same all the time. Pertinent negatives include no chest pain, no fever, no numbness, no weight loss, no headaches, no abdominal pain, no abdominal swelling, no bowel incontinence, no perianal numbness, no bladder incontinence, no dysuria, no leg pain, no paresthesias, no paresis, no tingling and no weakness. He has tried nothing for the symptoms. The treatment provided no relief.    Past Medical History  Diagnosis Date  . Cancer   . Hypertension     Past Surgical  History  Procedure Date  . Cholecystectomy     Family History  Problem Relation Age of Onset  . Heart failure Mother   . Hypertension Mother   . Heart failure Brother   . Hypertension Brother     History  Substance Use Topics  . Smoking status: Never Smoker   . Smokeless tobacco: Not on file  . Alcohol Use: No      Review of Systems  Constitutional: Negative for fever and weight loss.  Cardiovascular: Negative for chest pain.  Gastrointestinal: Negative for abdominal pain and bowel incontinence.  Genitourinary: Negative for bladder incontinence and dysuria.  Musculoskeletal: Positive for back pain.  Neurological: Negative for tingling, weakness, numbness, headaches and paresthesias.  All other systems reviewed and are negative.    Allergies  Review of patient's allergies indicates no known allergies.  Home Medications   Current Outpatient Rx  Name  Route  Sig  Dispense  Refill  . ACETAMINOPHEN 325 MG PO TABS   Oral   Take 650 mg by mouth every 6 (six) hours as needed. For pain/ fever         . LOSARTAN POTASSIUM 100 MG PO TABS   Oral   Take 100 mg by mouth daily.           BP 134/84  Pulse 60  Temp 98.4 F (36.9 C) (Oral)  Resp 18  Ht 6\' 1"  (1.854 m)  Wt  135 lb (61.236 kg)  BMI 17.81 kg/m2  SpO2 100%  Physical Exam  Nursing note and vitals reviewed. Constitutional: He is oriented to person, place, and time. He appears well-developed and well-nourished. No distress.  HENT:  Head: Normocephalic and atraumatic.  Eyes: Conjunctivae normal and EOM are normal. Pupils are equal, round, and reactive to light. No scleral icterus.  Neck: Normal range of motion and full passive range of motion without pain. Neck supple. No spinous process tenderness and no muscular tenderness present. No rigidity. Normal range of motion present. No Brudzinski's sign noted.  Cardiovascular: Normal rate, regular rhythm and intact distal pulses.  Exam reveals no gallop and no  friction rub.   No murmur heard.      intact distal pulses, capillary refill less than 2 seconds bilaterally.    Pulmonary/Chest: Effort normal and breath sounds normal. No respiratory distress. He has no wheezes. He has no rales. He exhibits no tenderness.       LCAB  Genitourinary: Guaiac negative stool.       Exam chaperoned, good rectal tone, no gross blood  Musculoskeletal: Normal range of motion.       Cervical back: He exhibits normal range of motion, no tenderness, no bony tenderness and no pain.       Thoracic back: He exhibits no tenderness, no bony tenderness and no pain.       Right foot: He exhibits no swelling.       Left foot: He exhibits no swelling.       Bilateral lower extremities nontender without color change, baseline range of motion of extremities with Pt has increased pain w ROM of lumbar spine. Pain w ambulation, no sign of ataxia.  Neurological: He is alert and oriented to person, place, and time. He has normal strength and normal reflexes. No sensory deficit.       Sensation at baseline for light touch in all 4 distal extremities, motor symmetric & bilateral 5/5   Skin: Skin is warm and dry. No rash noted. He is not diaphoretic. No erythema. No pallor.  Psychiatric: He has a normal mood and affect. His behavior is normal.    ED Course  Procedures (including critical care time)  Labs Reviewed  CBC WITH DIFFERENTIAL - Abnormal; Notable for the following:    WBC 2.7 (*)     RBC 2.83 (*)     Hemoglobin 9.3 (*)     HCT 28.0 (*)     Neutro Abs 1.2 (*)     Lymphocytes Relative 52 (*)     All other components within normal limits  BASIC METABOLIC PANEL - Abnormal; Notable for the following:    Sodium 131 (*)     All other components within normal limits   Dg Chest 2 View  08/03/2012  *RADIOLOGY REPORT*  Clinical Data: Cough, congestion.  CHEST - 2 VIEW  Comparison: 07/10/2011  Findings: Mild hyperinflation of the lungs. Heart and mediastinal contours are within  normal limits.  No focal opacities or effusions.  No acute bony abnormality.  IMPRESSION: No active cardiopulmonary disease.   Original Report Authenticated By: Charlett Nose, M.D.      No diagnosis found.  10:40 AM pt with decline in hgb over the last couple of months.  Hemoccult negative.  Discussed results with patient Who states he has followup with his "blood Dr." February 3 to evaluate his anemia further.  Patient denies lightheadedness, dizziness melena, hematochezia, or gross blood in stool.  BP  134/84  Pulse 60  Temp 98.4 F (36.9 C) (Oral)  Resp 18  Ht 6\' 1"  (1.854 m)  Wt 135 lb (61.236 kg)  BMI 17.81 kg/m2  SpO2 100%   MDM  Acute on chronic back pain, anemia, cough  Patient presents emergency department complaining of lingering cough after upper respiratory type infection.  Increased coughing has exacerbated patient's chronic back pain.  Labs and imaging reviewed showing no acute infiltrate, however low white blood count and anemia were found.  Patient states that he is following up with his hematologist on February 3 for further evaluation of this.  Discussed results in depth with patient.  Anemia is currently asymptomatic and appropriate followup has been scheduled.  Strict return precautions discussed.  Patient verbalizes understanding.  Patient will be discharged with pain medication. At this time there does not appear to be any evidence of an acute emergency medical condition and the patient appears stable for discharge with appropriate outpatient follow up.Diagnosis was discussed with patient who verbalizes understanding and is agreeable to discharge.   Jaci Carrel, [place] 08/03/12 1040

## 2012-08-03 NOTE — ED Notes (Signed)
Patient states chronic back pain. Patient claims he didn't want to wait to set up an appointment with primary doctor, but he is out of pain medication.

## 2012-08-13 ENCOUNTER — Other Ambulatory Visit: Payer: Self-pay | Admitting: Oncology

## 2012-08-13 DIAGNOSIS — D472 Monoclonal gammopathy: Secondary | ICD-10-CM

## 2012-08-13 DIAGNOSIS — D649 Anemia, unspecified: Secondary | ICD-10-CM | POA: Insufficient documentation

## 2012-08-13 HISTORY — DX: Monoclonal gammopathy: D47.2

## 2012-08-17 ENCOUNTER — Encounter: Payer: Self-pay | Admitting: Oncology

## 2012-08-17 ENCOUNTER — Ambulatory Visit: Payer: No Typology Code available for payment source

## 2012-08-17 ENCOUNTER — Other Ambulatory Visit (HOSPITAL_BASED_OUTPATIENT_CLINIC_OR_DEPARTMENT_OTHER): Payer: No Typology Code available for payment source | Admitting: Lab

## 2012-08-17 ENCOUNTER — Ambulatory Visit (HOSPITAL_BASED_OUTPATIENT_CLINIC_OR_DEPARTMENT_OTHER): Payer: No Typology Code available for payment source | Admitting: Oncology

## 2012-08-17 VITALS — BP 157/85 | HR 55 | Temp 98.0°F | Resp 18 | Ht 72.0 in | Wt 152.8 lb

## 2012-08-17 DIAGNOSIS — C9000 Multiple myeloma not having achieved remission: Secondary | ICD-10-CM

## 2012-08-17 DIAGNOSIS — D472 Monoclonal gammopathy: Secondary | ICD-10-CM

## 2012-08-17 DIAGNOSIS — D649 Anemia, unspecified: Secondary | ICD-10-CM

## 2012-08-17 DIAGNOSIS — R972 Elevated prostate specific antigen [PSA]: Secondary | ICD-10-CM

## 2012-08-17 LAB — CBC WITH DIFFERENTIAL/PLATELET
Eosinophils Absolute: 0 10*3/uL (ref 0.0–0.5)
HCT: 29.7 % — ABNORMAL LOW (ref 38.4–49.9)
LYMPH%: 55.2 % — ABNORMAL HIGH (ref 14.0–49.0)
MONO#: 0.1 10*3/uL (ref 0.1–0.9)
NEUT#: 1.4 10*3/uL — ABNORMAL LOW (ref 1.5–6.5)
NEUT%: 41.4 % (ref 39.0–75.0)
Platelets: 181 10*3/uL (ref 140–400)
RBC: 2.94 10*6/uL — ABNORMAL LOW (ref 4.20–5.82)
WBC: 3.3 10*3/uL — ABNORMAL LOW (ref 4.0–10.3)
lymph#: 1.8 10*3/uL (ref 0.9–3.3)

## 2012-08-17 LAB — COMPREHENSIVE METABOLIC PANEL (CC13)
Albumin: 3.4 g/dL — ABNORMAL LOW (ref 3.5–5.0)
CO2: 25 mEq/L (ref 22–29)
Calcium: 9.1 mg/dL (ref 8.4–10.4)
Glucose: 82 mg/dl (ref 70–99)
Sodium: 134 mEq/L — ABNORMAL LOW (ref 136–145)
Total Bilirubin: 0.26 mg/dL (ref 0.20–1.20)
Total Protein: 10.1 g/dL — ABNORMAL HIGH (ref 6.4–8.3)

## 2012-08-17 LAB — MORPHOLOGY: PLT EST: ADEQUATE

## 2012-08-17 LAB — LACTATE DEHYDROGENASE (CC13): LDH: 135 U/L (ref 125–245)

## 2012-08-17 LAB — CHCC SMEAR

## 2012-08-17 NOTE — Progress Notes (Signed)
,  dict  #[id number]

## 2012-08-18 ENCOUNTER — Other Ambulatory Visit: Payer: Self-pay

## 2012-08-18 ENCOUNTER — Telehealth: Payer: Self-pay

## 2012-08-18 DIAGNOSIS — D472 Monoclonal gammopathy: Secondary | ICD-10-CM

## 2012-08-18 LAB — PROTEIN / CREATININE RATIO, URINE
Creatinine, Urine: 101.3 mg/dL
Protein Creatinine Ratio: 0.03 (ref <0.15)
Total Protein, Urine: 3 mg/dL

## 2012-08-18 LAB — IGG, IGA, IGM
IgA: 27 mg/dL — ABNORMAL LOW (ref 68–379)
IgM, Serum: 14 mg/dL — ABNORMAL LOW (ref 41–251)

## 2012-08-18 LAB — KAPPA/LAMBDA LIGHT CHAINS: Kappa:Lambda Ratio: 30 — ABNORMAL HIGH (ref 0.26–1.65)
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
## 2012-08-18 NOTE — Telephone Encounter (Signed)
S/w wife that pt will have a bone marrow biopsy Fri 08/21/12. To be at [facility] admitting at 645 am. NPO after MN except pills with sip water in AM. Driver is needed for pt will be sedated. Letter also mailed.

## 2012-08-19 ENCOUNTER — Telehealth: Payer: Self-pay | Admitting: Oncology

## 2012-08-19 NOTE — Telephone Encounter (Signed)
lmonvm for pt re 2/19 appt. Also asked that pt pick up container for 24hr urine 2/7 (pt has appt @ [facility]). [facility] will contact pt re scan/xray appts. Schedule mailed.

## 2012-08-20 ENCOUNTER — Encounter (HOSPITAL_COMMUNITY): Payer: Self-pay | Admitting: Pharmacy Technician

## 2012-08-21 ENCOUNTER — Encounter (HOSPITAL_COMMUNITY): Payer: Self-pay

## 2012-08-21 ENCOUNTER — Ambulatory Visit (HOSPITAL_COMMUNITY)
Admission: RE | Admit: 2012-08-21 | Discharge: 2012-08-21 | Disposition: A | Discharge status: Home / Self Care | Payer: No Typology Code available for payment source | Source: Ambulatory Visit | Attending: Oncology | Admitting: Oncology

## 2012-08-21 VITALS — BP 136/81 | HR 55 | Temp 97.9°F | Resp 13 | Ht 73.5 in | Wt 153.0 lb

## 2012-08-21 DIAGNOSIS — D72819 Decreased white blood cell count, unspecified: Secondary | ICD-10-CM | POA: Insufficient documentation

## 2012-08-21 DIAGNOSIS — D649 Anemia, unspecified: Secondary | ICD-10-CM | POA: Insufficient documentation

## 2012-08-21 DIAGNOSIS — D472 Monoclonal gammopathy: Secondary | ICD-10-CM

## 2012-08-21 DIAGNOSIS — C9000 Multiple myeloma not having achieved remission: Secondary | ICD-10-CM

## 2012-08-21 LAB — CBC WITH DIFFERENTIAL/PLATELET
Basophils Absolute: 0 10*3/uL (ref 0.0–0.1)
Basophils Relative: 0 % (ref 0–1)
Eosinophils Relative: 0 % (ref 0–5)
HCT: 30.1 % — ABNORMAL LOW (ref 39.0–52.0)
Lymphocytes Relative: 51 % — ABNORMAL HIGH (ref 12–46)
MCHC: 33.2 g/dL (ref 30.0–36.0)
MCV: 100 fL (ref 78.0–100.0)
Monocytes Absolute: 0.1 10*3/uL (ref 0.1–1.0)
RDW: 13.1 % (ref 11.5–15.5)

## 2012-08-21 MED ORDER — MIDAZOLAM HCL 2 MG/2ML IJ SOLN
INTRAMUSCULAR | Status: AC | PRN
  Administered 2012-08-21 (×2): 1 mg via INTRAVENOUS

## 2012-08-21 MED ORDER — SODIUM CHLORIDE 0.9 % IV SOLN
INTRAVENOUS | Status: DC
  Administered 2012-08-21: 07:00:00 via INTRAVENOUS

## 2012-08-21 MED ORDER — MIDAZOLAM HCL 10 MG/2ML IJ SOLN
INTRAMUSCULAR | Status: AC
  Filled 2012-08-21: qty 2

## 2012-08-21 MED ORDER — MEPERIDINE HCL 50 MG/ML IJ SOLN
INTRAMUSCULAR | Status: AC
  Filled 2012-08-21: qty 1

## 2012-08-21 MED ORDER — MEPERIDINE HCL 25 MG/ML IJ SOLN
INTRAMUSCULAR | Status: AC | PRN
  Administered 2012-08-21 (×4): 12.5 mg via INTRAVENOUS

## 2012-08-21 MED ORDER — LIDOCAINE HCL 1 % IJ SOLN
INTRAMUSCULAR | Status: AC
  Filled 2012-08-21: qty 20

## 2012-08-21 NOTE — ED Notes (Signed)
Post iliac dressing CDI 

## 2012-08-21 NOTE — ED Notes (Signed)
Patient denies pain and is resting comfortably.  

## 2012-08-21 NOTE — ED Notes (Signed)
Ambulated in room post procedure and tolerated this well ready for discharge

## 2012-08-21 NOTE — ED Notes (Signed)
Patient is resting comfortably. 

## 2012-08-21 NOTE — ED Notes (Signed)
Dressing on post iliac area CDI

## 2012-08-21 NOTE — ED Notes (Signed)
Procedure ends dressing to left post iliac area with gauze and hypafix. Pt placed supine with towel to site for pressure

## 2012-08-21 NOTE — ED Notes (Signed)
Family updated as to patient's status.

## 2012-08-21 NOTE — Procedures (Signed)
BONE MARROW NOTE   Informed consent was obtained and potential risks including bleeding, infection and pain were reviewed with the patient.   Pre-procedure time out observed, Mallampati class III, ASA score II.  Bone marrow aspirate and biopsy with flow studies, cytogenetics, and FISH studies for multiple myeloma obtained from left superior posterior iliac spine under sterile conditions, 1% local lidocaine and Demerol 50 mg/Versed 2 mg IV. Pt tolerated procedure well. Good specimen obtained.   Indication: Patient most likely has multiple myeloma.  Samul Dada , MD 08/21/2012 8:56 AM

## 2012-08-21 NOTE — Sedation Documentation (Signed)
Medication dose calculated and verified YQM:VHQIONG 50 mg IV,VERSED 2mg  IV

## 2012-08-28 ENCOUNTER — Encounter (HOSPITAL_COMMUNITY)
Admission: RE | Admit: 2012-08-28 | Discharge: 2012-08-28 | Disposition: A | Discharge status: Home / Self Care | Payer: No Typology Code available for payment source | Source: Ambulatory Visit | Attending: Oncology | Admitting: Oncology

## 2012-08-28 ENCOUNTER — Ambulatory Visit (HOSPITAL_COMMUNITY)
Admission: RE | Admit: 2012-08-28 | Discharge: 2012-08-28 | Disposition: A | Discharge status: Home / Self Care | Payer: No Typology Code available for payment source | Source: Ambulatory Visit | Attending: Oncology | Admitting: Oncology

## 2012-08-28 ENCOUNTER — Telehealth: Payer: Self-pay | Admitting: Oncology

## 2012-08-28 ENCOUNTER — Other Ambulatory Visit: Payer: Self-pay | Admitting: Oncology

## 2012-08-28 DIAGNOSIS — R972 Elevated prostate specific antigen [PSA]: Secondary | ICD-10-CM

## 2012-08-28 DIAGNOSIS — D472 Monoclonal gammopathy: Secondary | ICD-10-CM

## 2012-08-28 DIAGNOSIS — M5137 Other intervertebral disc degeneration, lumbosacral region: Secondary | ICD-10-CM | POA: Insufficient documentation

## 2012-08-28 DIAGNOSIS — G8929 Other chronic pain: Secondary | ICD-10-CM | POA: Insufficient documentation

## 2012-08-28 DIAGNOSIS — M5126 Other intervertebral disc displacement, lumbar region: Secondary | ICD-10-CM | POA: Insufficient documentation

## 2012-08-28 DIAGNOSIS — M549 Dorsalgia, unspecified: Secondary | ICD-10-CM | POA: Insufficient documentation

## 2012-08-28 DIAGNOSIS — M51379 Other intervertebral disc degeneration, lumbosacral region without mention of lumbar back pain or lower extremity pain: Secondary | ICD-10-CM | POA: Insufficient documentation

## 2012-08-28 MED ORDER — GADOBENATE DIMEGLUMINE 529 MG/ML IV SOLN
10.0000 mL | Freq: Once | INTRAVENOUS | Status: AC | PRN
  Administered 2012-08-28: 9 mL via INTRAVENOUS

## 2012-08-28 MED ORDER — TECHNETIUM TC 99M MEDRONATE IV KIT
25.0000 | PACK | Freq: Once | INTRAVENOUS | Status: AC | PRN
  Administered 2012-08-28: 23.9 via INTRAVENOUS

## 2012-08-28 NOTE — Telephone Encounter (Signed)
pt needed to r/s lab and est..Marland KitchenMarland KitchenDone

## 2012-09-02 ENCOUNTER — Other Ambulatory Visit: Payer: No Typology Code available for payment source | Admitting: Lab

## 2012-09-02 ENCOUNTER — Ambulatory Visit: Payer: No Typology Code available for payment source | Admitting: Oncology

## 2012-09-04 ENCOUNTER — Emergency Department (HOSPITAL_COMMUNITY)
Admission: EM | Admit: 2012-09-04 | Discharge: 2012-09-04 | Disposition: A | Discharge status: Home / Self Care | Payer: No Typology Code available for payment source | Attending: Emergency Medicine | Admitting: Emergency Medicine

## 2012-09-04 ENCOUNTER — Encounter (HOSPITAL_COMMUNITY): Payer: Self-pay | Admitting: Cardiology

## 2012-09-04 DIAGNOSIS — Z79899 Other long term (current) drug therapy: Secondary | ICD-10-CM | POA: Insufficient documentation

## 2012-09-04 DIAGNOSIS — M545 Low back pain, unspecified: Secondary | ICD-10-CM | POA: Insufficient documentation

## 2012-09-04 DIAGNOSIS — I10 Essential (primary) hypertension: Secondary | ICD-10-CM | POA: Insufficient documentation

## 2012-09-04 DIAGNOSIS — Z859 Personal history of malignant neoplasm, unspecified: Secondary | ICD-10-CM | POA: Insufficient documentation

## 2012-09-04 DIAGNOSIS — G8929 Other chronic pain: Secondary | ICD-10-CM

## 2012-09-04 MED ORDER — KETOROLAC TROMETHAMINE 60 MG/2ML IM SOLN
60.0000 mg | Freq: Once | INTRAMUSCULAR | Status: AC
  Administered 2012-09-04: 60 mg via INTRAMUSCULAR
  Filled 2012-09-04: qty 2

## 2012-09-04 MED ORDER — DIAZEPAM 5 MG PO TABS
5.0000 mg | ORAL_TABLET | Freq: Once | ORAL | Status: AC
  Administered 2012-09-04: 5 mg via ORAL
  Filled 2012-09-04: qty 1

## 2012-09-04 MED ORDER — OXYCODONE-ACETAMINOPHEN 5-325 MG PO TABS: 2.0000 | ORAL_TABLET | ORAL | Status: DC | PRN

## 2012-09-04 NOTE — ED Notes (Signed)
Reports flare up of chronic pain back. States he was recently seen and had an MRI done showing some degeneration of disc in lower back. Normally takes rx pain medication but has been out for the past 2 months. No recent injury or decrease in sensation.

## 2012-09-04 NOTE — ED Provider Notes (Signed)
History     CSN: [id number]  Arrival date & time 09/04/12  1415   First MD Initiated Contact with Patient 09/04/12 1435      Chief Complaint  Patient presents with  . Back Pain    (Consider location/radiation/quality/duration/timing/severity/associated sxs/prior treatment) HPI Comments: Patient is a 62 year old male who presents with gradual onset of acutely worsening chronic lower back pain for the past month. The pain is sharp and severe and radiates down his bilateral legs. The pain is constant. Movement makes the pain worse. Patient's regular pain medication makes the pain better but he has been out for 2 months. Patient has not tried anything for pain. No associated symptoms. No saddles paresthesias or bladder/bowel incontinence.     Patient is a 62 y.o. male presenting with back pain.  Back Pain   Past Medical History  Diagnosis Date  . Cancer   . Hypertension     Past Surgical History  Procedure Laterality Date  . Cholecystectomy      Family History  Problem Relation Age of Onset  . Heart failure Mother   . Hypertension Mother   . Heart failure Brother   . Hypertension Brother     History  Substance Use Topics  . Smoking status: Never Smoker   . Smokeless tobacco: Not on file  . Alcohol Use: No      Review of Systems  Musculoskeletal: Positive for back pain.  All other systems reviewed and are negative.    Allergies  Review of patient's allergies indicates no known allergies.  Home Medications   Current Outpatient Rx  Name  Route  Sig  Dispense  Refill  . acetaminophen (TYLENOL) 325 MG tablet   Oral   Take 650 mg by mouth every 6 (six) hours as needed. For pain/ fever         . losartan (COZAAR) 100 MG tablet   Oral   Take 100 mg by mouth daily.           BP 147/88  Pulse 64  Temp(Src) 98.5 F (36.9 C) (Oral)  Resp 20  SpO2 99%  Physical Exam  Nursing note and vitals reviewed. Constitutional: He is oriented to person,  place, and time. He appears well-developed and well-nourished. No distress.  HENT:  Head: Normocephalic and atraumatic.  Eyes: Conjunctivae are normal.  Neck: Normal range of motion.  Cardiovascular: Normal rate and regular rhythm.  Exam reveals no gallop and no friction rub.   No murmur heard. Pulmonary/Chest: Effort normal and breath sounds normal. He has no wheezes. He has no rales. He exhibits no tenderness.  Abdominal: Soft. There is no tenderness.  Musculoskeletal: Normal range of motion.  Generalized lumbosacral tenderness to palpation.   Neurological: He is alert and oriented to person, place, and time. Coordination normal.  Strength and sensation equal and intact bilaterally Speech is goal-oriented. Moves limbs without ataxia.   Skin: Skin is warm and dry.  Psychiatric: He has a normal mood and affect. His behavior is normal.    ED Course  Procedures (including critical care time)  Labs Reviewed - No data to display No results found.   1. Chronic back pain       MDM  3:03 PM Patient given toradol and valium for pain which provides some relief. No bladder/bowel incontinence or saddle paresthesias. No recent injury. Patient will have a prescription for Percocet and have recommended follow up with his PCP.  Emilia Beck, [place] 09/05/12 [phone]

## 2012-09-04 NOTE — ED Notes (Signed)
Pt states "I'm not addicted to no pain medication but I normally get oxycontin for pain medicine and I need some more of that, that's why I'm here."

## 2012-09-07 NOTE — ED Provider Notes (Signed)
Medical screening examination/treatment/procedure(s) were performed by non-physician practitioner and as supervising physician I was immediately available for consultation/collaboration.   Emmely Bittinger M Tishia Maestre, MD 09/07/12 1956 

## 2012-09-10 ENCOUNTER — Telehealth: Payer: Self-pay

## 2012-09-10 NOTE — Telephone Encounter (Signed)
S/w pt that his appt is 1130 am on Monday. He said "OK".

## 2012-09-14 ENCOUNTER — Telehealth: Payer: Self-pay | Admitting: *Deleted

## 2012-09-14 ENCOUNTER — Other Ambulatory Visit (HOSPITAL_BASED_OUTPATIENT_CLINIC_OR_DEPARTMENT_OTHER): Payer: No Typology Code available for payment source | Admitting: Lab

## 2012-09-14 ENCOUNTER — Telehealth: Payer: Self-pay | Admitting: Oncology

## 2012-09-14 ENCOUNTER — Ambulatory Visit (HOSPITAL_BASED_OUTPATIENT_CLINIC_OR_DEPARTMENT_OTHER): Payer: No Typology Code available for payment source | Admitting: Oncology

## 2012-09-14 ENCOUNTER — Other Ambulatory Visit: Payer: Self-pay | Admitting: Medical Oncology

## 2012-09-14 ENCOUNTER — Encounter: Payer: Self-pay | Admitting: Oncology

## 2012-09-14 VITALS — BP 156/88 | HR 58 | Temp 97.8°F | Resp 18 | Ht 73.5 in | Wt 155.7 lb

## 2012-09-14 DIAGNOSIS — C9000 Multiple myeloma not having achieved remission: Secondary | ICD-10-CM | POA: Insufficient documentation

## 2012-09-14 DIAGNOSIS — D472 Monoclonal gammopathy: Secondary | ICD-10-CM

## 2012-09-14 DIAGNOSIS — D649 Anemia, unspecified: Secondary | ICD-10-CM

## 2012-09-14 LAB — CBC WITH DIFFERENTIAL/PLATELET
BASO%: 0.3 % (ref 0.0–2.0)
Basophils Absolute: 0 10*3/uL (ref 0.0–0.1)
HCT: 27.6 % — ABNORMAL LOW (ref 38.4–49.9)
LYMPH%: 51.1 % — ABNORMAL HIGH (ref 14.0–49.0)
MCHC: 34.6 g/dL (ref 32.0–36.0)
MONO#: 0.1 10*3/uL (ref 0.1–0.9)
NEUT%: 44.3 % (ref 39.0–75.0)
Platelets: 164 10*3/uL (ref <2.0)
WBC: 3.3 10*3/uL — ABNORMAL LOW (ref 4.0–10.3)

## 2012-09-14 LAB — URIC ACID (CC13): Uric Acid, Serum: 5.8 mg/dl (ref 2.6–7.4)

## 2012-09-14 LAB — COMPREHENSIVE METABOLIC PANEL (CC13)
ALT: 25 U/L (ref 0–55)
Albumin: 3.2 g/dL — ABNORMAL LOW (ref 3.5–5.0)
CO2: 26 mEq/L (ref 22–29)
Calcium: 8.7 mg/dL (ref 8.4–10.4)
Chloride: 105 mEq/L (ref 98–107)
Potassium: 4.3 mEq/L (ref 3.5–5.1)
Sodium: 135 mEq/L — ABNORMAL LOW (ref 136–145)
Total Protein: 9.3 g/dL — ABNORMAL HIGH (ref 6.4–8.3)

## 2012-09-14 LAB — LACTATE DEHYDROGENASE (CC13): LDH: 148 U/L (ref 125–245)

## 2012-09-14 MED ORDER — ONDANSETRON HCL 8 MG PO TABS: 8.0000 mg | ORAL_TABLET | Freq: Three times a day (TID) | ORAL | Status: DC | PRN

## 2012-09-14 MED ORDER — ACYCLOVIR 400 MG PO TABS: 400.0000 mg | ORAL_TABLET | Freq: Two times a day (BID) | ORAL | Status: DC

## 2012-09-14 NOTE — Progress Notes (Signed)
CC:   Travis Nguyen, M.D. Travis Fuller, MD  PROBLEM LIST:  1. 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 Metastatic bone survey from  08/08/2009 was normal. Urine from 07/24/2009 yielded 27 mg of  protein. Urine immunofixation electrophoresis was positive.  IgG level on 08/17/2012 was 4760.  Kappa light chains were 21.90.  Beta 2 microglobulin 1.99.  Albumin 3.4.  Total protein 10.1.  Globulins 6.7. Bone marrow aspirate and biopsy carried out on 08/21/2012 showed 23% plasma cells.  Cytogenetics, flow studies, and FISH studies for multiple myeloma are pending.  A 24-hour urine collection for protein and urine immunofixation electrophoresis are pending.  Stage by the ISS for multiple myeloma is stage II.  Metastatic bone survey carried out on 08/28/2012 was negative for evidence of multiple myeloma.  2. Leukopenia and neutropenia.  3. Anemia.  4. Elevated PSA--14.7 on 07/20/2012, whereas PSA on 06/28/2009 had  been 4.25. Patient to have prostate biopsy by Dr. Isabel Caprice. 5. Hypertension.  6. Hemangiomas of the liver seen on ultrasound carried out on  07/31/2009.  7. Lower back pain with radiation into the right leg.  Degenerative disk disease and spinal stenosis seen on an MRI of the lumbar spine with and without IV contrast on 08/28/2012.  There was no evidence for myeloma or cancer.  No pathologic fracture. 8. Status post laparoscopic cholecystectomy carried out in 2002.    MEDICATIONS:  Reviewed and recorded. Current Outpatient Prescriptions  Medication Sig Dispense Refill  . acetaminophen (TYLENOL) 325 MG tablet Take 650 mg by mouth every 6 (six) hours as needed. For pain/ fever      . losartan (COZAAR) 100 MG tablet Take 100 mg by mouth daily.      [name] oxyCODONE-acetaminophen (PERCOCET/ROXICET) 5-325 MG per tablet Take 2 tablets by mouth every 4 (four) hours  as needed for pain.  20 tablet  0  . acyclovir (ZOVIRAX) 400 MG tablet Take 1 tablet (400 mg total) by mouth 2 (two) times daily.  60 tablet  6  . ondansetron (ZOFRAN) 8 MG tablet Take 1 tablet (8 mg total) by mouth every 8 (eight) hours as needed for nausea.  20 tablet  3   No current facility-administered medications for this visit.   1. Velcade 1.3 mg/m2, equal to 2.5 mg subcu weekly. 2. Cytoxan 300 mg per meter squared equal to 500 mg IV weekly. 3. Decadron 40 mg IV weekly.Chemotherapy will be started on 09/24/2012.   IMMUNIZATIONS: Flu shot was given in December 2013.   SMOKING HISTORY: The patient smoked up to 2 packs of cigarettes a day  for 5 years. He has not smoked since his mid 44s.   HISTORY:  I am seeing Travis Nguyen today for a followup of what we now know is multiple myeloma.  Travis Nguyen is accompanied by his wife, Travis Nguyen.  Also, his son.  Travis Nguyen was evaluated by [name] on 08/17/2012. It will be recalled that we had seen him back in the spring of 2010 for what was then felt to be smoldering multiple myeloma.  His bone marrow at that time showed 11% plasma cells.  Cytogenetics showed 2 extra copies of chromosome 11.  Urine protein at that time was normal at 27 mg; however, his urine immunofixation electrophoresis showed IgG kappa monoclonal proteins.  His metastatic bone survey was negative.  Travis Nguyen was lost to followup until recently.  His protein  levels were high and we embarked upon workup as described above.  It appears that there is no evidence for myeloma, any lytic destructive lesions suggestive of multiple myeloma.  A bone scan also was carried out on 08/28/2012 and did not show any evidence of metastatic prostate cancer.  An MRI, however, does show spinal stenosis and degenerative disk disease. Travis Nguyen was seen by Dr. Isabel Caprice.  He will be undergoing ultrasound-guided prostate biopsy around April 24th.  Travis Nguyen's main complaint is progressive back pain.  He  is taking 2 Percocet a day.  A few weeks ago he was taking 1 Percocet a day.  He is having more difficulty walking, bending, and doing his job.  He is having pain radiating into his right leg.  He sometimes has numbness in his left leg.  He feels his legs are weak, and sometimes they give out on him.  Today's session was rather lengthy, lasting well over an hour as we reviewed the studies that have been carried out over the past few weeks and talked about chemotherapy, going over in detail benefits and possible side effects as well as toxicities.  The patient will be meeting with our chemotherapy education nurse to have a formal review of chemotherapy and logistics.  The patient is without any other complaints or problems today.  PHYSICAL EXAM:  Travis Nguyen shows no major changes from a month ago.  Weight is 155 pounds 11.2 ounces.  Height 6 feet 1-1/2 inches.  Body surface area 1.91 sq m.  Blood pressure 156/88.  Other vital signs are normal. There is no scleral icterus.  Mouth and pharynx are benign.  Dentition is poor.  There is no adenopathy palpable.  Lungs:  Clear to percussion and auscultation.  Cardiac exam:  A short early systolic ejection murmur was present.  Abdomen:  Benign with no organomegaly or masses palpable. There was no tenderness over the lumbar spine, which is where the patient localizes his pain.  No axillary or inguinal adenopathy. Extremities:  No peripheral edema.  No petechiae or purpura. Neurologic:  Nonfocal.  LABORATORY DATA:  Today, white count 3.3 with an ANC of 1.5, hemoglobin 9.6, hematocrit 27.6, platelets 164,000.  Chemistries today which include an LDH, also an IgG level, and serum light chains are all pending.  Uric acid today was 5.8.  LDH 148.  Labs from 08/27/2012 could be found above.  In brief, the IgG level was 4760.  Kappa free light chains were 21.90.  Kappa lambda ratio 30.00, all elevated.  Beta 2 microglobulin was 1.99 with normal being  1.01-1.73.  IgA level was 27, which is decreased.  IgM, which was 14, also decreased.  Albumin was 3.4 with a total protein of 10.1 and therefore globulins of 6.7.  LDH was 135.  A 24-hour urine collection with a urine immunofixation electrophoresis is pending.  IMAGING STUDIES:  1. Abdominal ultrasound carried out on 07/31/2009 showed  a hyperechoic lesion in the right lobe of the liver which basically had  not changed compared with the study carried out on 08/09/2006, felt to  be most consistent with a hemangioma. The patient was status post  cholecystectomy. Spleen was normal.  2. Metastatic bone survey on 08/08/2009 showed no lytic bone lesions.  3. Lumbar spine complete 4 views from 10/14/2011 showed L5-S1 and to a  lesser extent L4-L5 degenerative disk space narrowing. Otherwise, a  negative study.  4. Sacrum and coccyx 2 views showed moderate bilateral hip joint  degenerative changes. There were  femoral head subchondral cystic  changes and sclerosis, greater on the right. There were prominent  degenerative changes at L5-S1.  5. Chest x-ray 2 view from 08/03/2012 showed no active cardiopulmonary  Disease. 6. Metastatic bone survey from 08/28/2012 was negative for evidence of multiple myeloma or any destructive lesions. 7. MRI of the lumbar spine with and without IV contrast from 08/28/2012 showed no evidence for multiple myeloma.  There were no pathologic fractures.  There was heterogeneous marrow signal.  There was degenerative disk disease at L4-L5 and L5-S1.  There was mild L4-L5 central stenosis and bilateral lateral recess stenosis which potentially affects the descending L5 nerves.  The L5-S1 left lateral recess encroachment potentially affects the descending left S1 nerve. 8. Nuclear medicine whole body bone scan from 08/28/2012 showed activity within multiple joints, most likely arthritic or degenerative.  There was no pattern to suggest skeletal metastatic  disease.    IMPRESSION/PLAN:  Mr. Dillinger' smoldering multiple myeloma has evolved into frank multiple myeloma with 23% plasma cells.  He has IgG kappa multiple myeloma stage II on the basis of an albumin that was 3.4 in the [employer].  We will make plans to get Travis Nguyen started on chemotherapy consisting of subcutaneous Velcade, IV Cytoxan and Decadron, as described above.  Dose of Velcade will be 2.5 mg subcu weekly, Cytoxan 500 mg IV weekly, and Decadron 40 mg IV weekly.  We will call in the following medicines: Acyclovir 400 mg b.i.d., allopurinol 300 mg daily, and Zofran 8 mg to be taken every 8 hours as needed for nausea.  The patient is being set up with a chemotherapy education class.  He does have a urine jug so he can collect a 24-hour urine for protein and a urine immunofixation electrophoresis.  We have Travis Nguyen scheduled to start chemotherapy on March 13th.  He will have a CBC, chemistries, IgA level, and serum light chains prior to starting treatment.  He will come back 1 week later on March 20th for CBC and a second dose of chemotherapy.  We will plan to see Travis Nguyen again around March 27th, at which time he will be due for his 3rd dose of chemotherapy.  We will check CBC, chemistries, IgA level, and serum light chains.  At some point, Travis Nguyen may be a candidate for high-dose chemotherapy and autologous stem cell rescue.  For now, we are going to hold off on Zometa.  Travis Nguyen dentition seems to be poor.  This was an extended visit, lasting over an hour.    ______________________________ Samul Dada, M.D. DSM/MEDQ  D:  09/14/2012  T:  09/14/2012  Job:  [id number]

## 2012-09-14 NOTE — Telephone Encounter (Signed)
Per staff phone call and POF I have schedueld appts.  JMW  

## 2012-09-14 NOTE — Telephone Encounter (Signed)
gv and printed appt schedule for pt for March...pt awre...s/w and emaild michelle

## 2012-09-14 NOTE — Patient Instructions (Addendum)
Your bone marrow showed that you have multiple myeloma.  Complete your 24 hour urine collection as instructed.  Talk to Dr. Azucena Cecil about being referred to a neuro surgeon or spine specialist regarding your back symptoms and the abnormal MRI of your lumbar spine.  Chemotherapy education class to be scheduled prior to starting chemotherapy.  We plan to get chemotherapy started about 3/13.  Chemo will be given weekly after labs are checked.  We will call into your pharmacy new prescription medicine for you to take.  I will plan to see you again about 3/27.

## 2012-09-14 NOTE — Progress Notes (Signed)
This office note has been dictated.  #[id number]

## 2012-09-15 ENCOUNTER — Encounter: Payer: Self-pay | Admitting: Internal Medicine

## 2012-09-15 ENCOUNTER — Other Ambulatory Visit: Payer: Self-pay | Admitting: Oncology

## 2012-09-15 ENCOUNTER — Other Ambulatory Visit: Payer: No Typology Code available for payment source

## 2012-09-15 ENCOUNTER — Encounter: Payer: Self-pay | Admitting: *Deleted

## 2012-09-15 DIAGNOSIS — C9000 Multiple myeloma not having achieved remission: Secondary | ICD-10-CM

## 2012-09-15 NOTE — Progress Notes (Signed)
Called patient, left message on machine, about assisting him with applying for velcade assistance, asked him to please return my call.

## 2012-09-16 ENCOUNTER — Encounter: Payer: Self-pay | Admitting: Oncology

## 2012-09-16 ENCOUNTER — Encounter: Payer: Self-pay | Admitting: Specialist

## 2012-09-16 ENCOUNTER — Other Ambulatory Visit: Payer: Self-pay | Admitting: Oncology

## 2012-09-16 DIAGNOSIS — D472 Monoclonal gammopathy: Secondary | ICD-10-CM

## 2012-09-16 LAB — KAPPA/LAMBDA LIGHT CHAINS: Kappa:Lambda Ratio: 61.6 — ABNORMAL HIGH (ref 0.26–1.65)

## 2012-09-16 NOTE — Progress Notes (Signed)
I met the patient at the chemotherapy orientation class and told him about available support services at the [facility].  I encouraged him to attend the Multiple Myeloma  Support Group.

## 2012-09-16 NOTE — Progress Notes (Signed)
Patient came in and bought partial tax form. I advised him I would give to [facility] and he needed to call his tax person and give ok for Travis Nguyen to talk to him. He only had page 2 of his taxes. I told him he needed all of it. Left for Travis Nguyen.

## 2012-09-17 ENCOUNTER — Encounter: Payer: Self-pay | Admitting: Oncology

## 2012-09-17 ENCOUNTER — Telehealth: Payer: Self-pay | Admitting: Medical Oncology

## 2012-09-17 LAB — PROTEIN ELECTROPHORESIS, SERUM
Beta Globulin: 4 % — ABNORMAL LOW (ref 4.7–7.2)
M-Spike, %: 3.71 g/dL
Total Protein, Serum Electrophoresis: 9.5 g/dL — ABNORMAL HIGH (ref 6.0–8.3)

## 2012-09-17 NOTE — Progress Notes (Signed)
We received the report from the cytogenetics laboratory at [facility] from the bone marrow that was carried out on 08/21/2012. Once again we found that FISH studies confirmed the gain of a chromosome 11. Cytogenetic analysis was normal.

## 2012-09-18 LAB — UIFE/LIGHT CHAINS/TP QN, 24-HR UR
Alpha 1, Urine: DETECTED — AB
Alpha 2, Urine: DETECTED — AB
Free Kappa Lt Chains,Ur: 2.17 mg/dL (ref 0.14–2.42)
Free Lambda Excretion/Day: 0.4 mg/d
Gamma Globulin, Urine: DETECTED — AB
Time: 24 hours

## 2012-09-21 ENCOUNTER — Ambulatory Visit (HOSPITAL_COMMUNITY): Payer: No Typology Code available for payment source

## 2012-09-21 ENCOUNTER — Other Ambulatory Visit (HOSPITAL_COMMUNITY): Payer: Self-pay | Admitting: Unknown Physician Specialty

## 2012-09-21 ENCOUNTER — Other Ambulatory Visit (HOSPITAL_COMMUNITY): Payer: Self-pay | Admitting: Family Medicine

## 2012-09-21 DIAGNOSIS — R011 Cardiac murmur, unspecified: Secondary | ICD-10-CM

## 2012-09-23 NOTE — Telephone Encounter (Signed)
Informed pt that his note for work will be at the front desk with [employer]

## 2012-09-24 ENCOUNTER — Encounter: Payer: Self-pay | Admitting: Oncology

## 2012-09-24 ENCOUNTER — Other Ambulatory Visit (HOSPITAL_BASED_OUTPATIENT_CLINIC_OR_DEPARTMENT_OTHER): Payer: No Typology Code available for payment source | Admitting: Lab

## 2012-09-24 ENCOUNTER — Ambulatory Visit (HOSPITAL_BASED_OUTPATIENT_CLINIC_OR_DEPARTMENT_OTHER): Payer: No Typology Code available for payment source

## 2012-09-24 VITALS — BP 134/85 | HR 59 | Temp 98.3°F | Resp 16

## 2012-09-24 DIAGNOSIS — C9000 Multiple myeloma not having achieved remission: Secondary | ICD-10-CM

## 2012-09-24 DIAGNOSIS — Z5112 Encounter for antineoplastic immunotherapy: Secondary | ICD-10-CM

## 2012-09-24 LAB — CBC WITH DIFFERENTIAL/PLATELET
Basophils Absolute: 0 10*3/uL (ref 0.0–0.1)
Eosinophils Absolute: 0 10*3/uL (ref 0.0–0.5)
HGB: 10.3 g/dL — ABNORMAL LOW (ref 13.0–17.1)
LYMPH%: 44.9 % (ref 14.0–49.0)
MCV: 98.7 fL — ABNORMAL HIGH (ref 79.3–98.0)
MONO%: 2.7 % (ref 0.0–14.0)
NEUT#: 1.6 10*3/uL (ref 1.5–6.5)
NEUT%: 51.7 % (ref 39.0–75.0)
Platelets: 172 10*3/uL (ref 140–400)

## 2012-09-24 LAB — COMPREHENSIVE METABOLIC PANEL (CC13)
ALT: 23 U/L (ref 0–55)
CO2: 24 mEq/L (ref 22–29)
Calcium: 9.5 mg/dL (ref 8.4–10.4)
Chloride: 105 mEq/L (ref 98–107)
Sodium: 134 mEq/L — ABNORMAL LOW (ref 136–145)
Total Protein: 10.6 g/dL — ABNORMAL HIGH (ref 6.4–8.3)

## 2012-09-24 LAB — LACTATE DEHYDROGENASE (CC13): LDH: 152 U/L (ref 125–245)

## 2012-09-24 MED ORDER — ONDANSETRON HCL 8 MG PO TABS
8.0000 mg | ORAL_TABLET | Freq: Once | ORAL | Status: AC
  Administered 2012-09-24: 8 mg via ORAL

## 2012-09-24 MED ORDER — BORTEZOMIB CHEMO SQ INJECTION 3.5 MG (2.5MG/ML)
1.3000 mg/m2 | Freq: Once | INTRAMUSCULAR | Status: AC
  Administered 2012-09-24: 2.5 mg via SUBCUTANEOUS
  Filled 2012-09-24: qty 2.5

## 2012-09-24 MED ORDER — SODIUM CHLORIDE 0.9 % IV SOLN
500.0000 mg | Freq: Once | INTRAVENOUS | Status: AC
  Administered 2012-09-24: 500 mg via INTRAVENOUS
  Filled 2012-09-24: qty 25

## 2012-09-24 MED ORDER — DEXAMETHASONE SODIUM PHOSPHATE 4 MG/ML IJ SOLN
40.0000 mg | Freq: Once | INTRAMUSCULAR | Status: AC
  Administered 2012-09-24: 40 mg via INTRAVENOUS

## 2012-09-24 MED ORDER — SODIUM CHLORIDE 0.9 % IV SOLN
INTRAVENOUS | Status: DC
  Administered 2012-09-24: 09:00:00 via INTRAVENOUS

## 2012-09-24 NOTE — Patient Instructions (Addendum)
[facility] Discharge Instructions for Patients Receiving Chemotherapy  Today you received the following chemotherapy agents cytoxan, velcade, decadron PO  To help prevent nausea and vomiting after your treatment, we encourage you to take your nausea medication as directed Begin taking it at 3 pm and take it as often as prescribed for the next 48-72 hours.   If you develop nausea and vomiting that is not controlled by your nausea medication, call the clinic. If it is after clinic hours your family physician or the after hours number for the clinic or go to the Emergency Department.   BELOW ARE SYMPTOMS THAT SHOULD BE REPORTED IMMEDIATELY:  *FEVER GREATER THAN 100.5 F  *CHILLS WITH OR WITHOUT FEVER  NAUSEA AND VOMITING THAT IS NOT CONTROLLED WITH YOUR NAUSEA MEDICATION  *UNUSUAL SHORTNESS OF BREATH  *UNUSUAL BRUISING OR BLEEDING  TENDERNESS IN MOUTH AND THROAT WITH OR WITHOUT PRESENCE OF ULCERS  *URINARY PROBLEMS  *BOWEL PROBLEMS  UNUSUAL RASH Items with * indicate a potential emergency and should be followed up as soon as possible.  One of the nurses will contact you 24 hours after your treatment. Please let the nurse know about any problems that you may have experienced. Feel free to call the clinic you have any questions or concerns. The clinic phone number is [phone].   I have been informed and understand all the instructions given to me. I know to contact the clinic, my physician, or go to the Emergency Department if any problems should occur. I do not have any questions at this time, but understand that I may call the clinic during office hours   should I have any questions or need assistance in obtaining follow up care.    __________________________________________  _____________  __________ Signature of Patient or Authorized Representative            Date                   Time    __________________________________________ Nurse's  Signature

## 2012-09-24 NOTE — Progress Notes (Signed)
Put fmla form on nurse's desk °

## 2012-09-25 ENCOUNTER — Emergency Department (HOSPITAL_COMMUNITY)
Admission: EM | Admit: 2012-09-25 | Discharge: 2012-09-25 | Disposition: A | Discharge status: Home / Self Care | Payer: Self-pay | Attending: Emergency Medicine | Admitting: Emergency Medicine

## 2012-09-25 ENCOUNTER — Ambulatory Visit (HOSPITAL_COMMUNITY)
Admission: RE | Admit: 2012-09-25 | Discharge: 2012-09-25 | Disposition: A | Discharge status: Home / Self Care | Payer: No Typology Code available for payment source | Source: Ambulatory Visit | Attending: Family Medicine | Admitting: Family Medicine

## 2012-09-25 ENCOUNTER — Encounter (HOSPITAL_COMMUNITY): Payer: Self-pay | Admitting: Emergency Medicine

## 2012-09-25 ENCOUNTER — Encounter (HOSPITAL_COMMUNITY): Payer: Self-pay | Admitting: *Deleted

## 2012-09-25 ENCOUNTER — Emergency Department (HOSPITAL_COMMUNITY)
Admission: EM | Admit: 2012-09-25 | Discharge: 2012-09-26 | Disposition: A | Discharge status: Home / Self Care | Payer: No Typology Code available for payment source | Attending: Emergency Medicine | Admitting: Emergency Medicine

## 2012-09-25 ENCOUNTER — Telehealth: Payer: Self-pay | Admitting: *Deleted

## 2012-09-25 DIAGNOSIS — Z8546 Personal history of malignant neoplasm of prostate: Secondary | ICD-10-CM | POA: Insufficient documentation

## 2012-09-25 DIAGNOSIS — I10 Essential (primary) hypertension: Secondary | ICD-10-CM | POA: Insufficient documentation

## 2012-09-25 DIAGNOSIS — Z79899 Other long term (current) drug therapy: Secondary | ICD-10-CM | POA: Insufficient documentation

## 2012-09-25 DIAGNOSIS — C9000 Multiple myeloma not having achieved remission: Secondary | ICD-10-CM | POA: Insufficient documentation

## 2012-09-25 DIAGNOSIS — R011 Cardiac murmur, unspecified: Secondary | ICD-10-CM

## 2012-09-25 DIAGNOSIS — Z859 Personal history of malignant neoplasm, unspecified: Secondary | ICD-10-CM | POA: Insufficient documentation

## 2012-09-25 DIAGNOSIS — R066 Hiccough: Secondary | ICD-10-CM

## 2012-09-25 DIAGNOSIS — Z5111 Encounter for antineoplastic chemotherapy: Secondary | ICD-10-CM | POA: Insufficient documentation

## 2012-09-25 LAB — KAPPA/LAMBDA LIGHT CHAINS: Kappa:Lambda Ratio: 50.45 — ABNORMAL HIGH (ref 0.26–1.65)

## 2012-09-25 MED ORDER — METOCLOPRAMIDE HCL 10 MG PO TABS: 10.0000 mg | ORAL_TABLET | Freq: Four times a day (QID) | ORAL | Status: DC | PRN

## 2012-09-25 MED ORDER — METOCLOPRAMIDE HCL 10 MG PO TABS
10.0000 mg | ORAL_TABLET | Freq: Once | ORAL | Status: AC
  Administered 2012-09-25: 10 mg via ORAL
  Filled 2012-09-25: qty 1

## 2012-09-25 NOTE — ED Notes (Signed)
Pt presenting to ed with c/o having chemo yesterday and now he's having hiccups. Pt states "I think it's from all of the medications that I received yesterday" pt states onset 5:00am. Pt states he started a new chemo yesterday. Pt states hiccups are intermittent

## 2012-09-25 NOTE — Telephone Encounter (Signed)
PT. WENT TO THE EMERGENCY ROOM FOR HICCUPS. HE WAS GIVEN REGLAN. HIS HICCUPS HAVE STOPPED. PT.'S APPETITE IS GOOD AND HE IS FORCING FLUIDS. HIS MOUTH AREA IS NORMAL. NO DIARRHEA. PT IS SLIGHTLY CONSTIPATED. SUGGESTED PT. START USING A STOOL SOFTNER. INSTRUCTED PT. TO CALL THIS OFFICE WITH ANY PROBLEMS OR QUESTIONS. IF THE OFFICE IS CLOSED ASK TO SPEAK TO THE ON CALL PHYSICIAN. HE VOICES UNDERSTANDING.

## 2012-09-25 NOTE — ED Notes (Signed)
Patient is here due to hiccups not sure if this is related to chemo yesterday. Does not want to get in grown. Patient is conscious and alert no acute distress.

## 2012-09-25 NOTE — ED Provider Notes (Addendum)
History     CSN: [id number]  Arrival date & time 09/25/12  1112   First MD Initiated Contact with Patient 09/25/12 1118      Chief Complaint  Patient presents with  . Hiccups    (Consider location/radiation/quality/duration/timing/severity/associated sxs/prior treatment) The history is provided by the patient.  Travis Nguyen is a 62 y.o. male hx of multiple myeloma with chronic back pain, hypertension here presenting with hiccups. He was started on IV chemotherapy yesterday and had his first dose. This morning around 5 AM he had intermittent hiccups since then. Denies any abdominal pain or chest pain or short of breath. He thinks it's related to his chemotherapy yesterday. Otherwise no new meds. He is chronically constipated but not worse than usual and denies any fevers or chills or vomiting.   Past Medical History  Diagnosis Date  . Cancer   . Hypertension     Past Surgical History  Procedure Laterality Date  . Cholecystectomy      Family History  Problem Relation Age of Onset  . Heart failure Mother   . Hypertension Mother   . Heart failure Brother   . Hypertension Brother     History  Substance Use Topics  . Smoking status: Never Smoker   . Smokeless tobacco: Not on file  . Alcohol Use: No      Review of Systems  Gastrointestinal:       Hiccups   All other systems reviewed and are negative.    Allergies  Review of patient's allergies indicates no known allergies.  Home Medications   Current Outpatient Rx  Name  Route  Sig  Dispense  Refill  . acyclovir (ZOVIRAX) 400 MG tablet   Oral   Take 400 mg by mouth 2 (two) times daily.         [name] losartan (COZAAR) 100 MG tablet   Oral   Take 100 mg by mouth daily.         . ondansetron (ZOFRAN) 8 MG tablet   Oral   Take 8 mg by mouth every 8 (eight) hours as needed for nausea.         [name] oxyCODONE-acetaminophen (PERCOCET/ROXICET) 5-325 MG per tablet   Oral   Take 2 tablets by mouth every 4  (four) hours as needed for pain.         [name] PRESCRIPTION MEDICATION   Intravenous   Inject 1 application into the vein every 7 (seven) days. Pt gets chemo tx from [facility]. Pt started yesterday           BP 149/83  Pulse 68  Temp(Src) 97.8 F (36.6 C) (Oral)  Resp 20  SpO2 99%  Physical Exam  Nursing note and vitals reviewed. Constitutional: He is oriented to person, place, and time. He appears well-developed and well-nourished.  NAD, no hiccups during interviews   HENT:  Head: Normocephalic.  Mouth/Throat: Oropharynx is clear and moist.  Eyes: Conjunctivae are normal. Pupils are equal, round, and reactive to light.  Neck: Normal range of motion. Neck supple.  Cardiovascular: Normal rate, regular rhythm and normal heart sounds.   Pulmonary/Chest: Effort normal and breath sounds normal. No respiratory distress. He has no wheezes. He has no rales.  Abdominal: Soft. Bowel sounds are normal. He exhibits no distension. There is no tenderness. There is no rebound.  Musculoskeletal: Normal range of motion.  Neurological: He is alert and oriented to person, place, and time.  Skin: Skin is warm and  dry.  Psychiatric: He has a normal mood and affect. His behavior is normal. Judgment and thought content normal.    ED Course  Procedures (including critical care time)  Labs Reviewed - No data to display No results found.   No diagnosis found.    MDM  Travis Nguyen is a 62 y.o. male here with intermittent hiccups. Doubt cardiac cause, likely side effect from chemo. No hiccups during interview. Will give reglan and reassess.   12:01 PM No hiccups after reglan. D/c home on reglan.        Richardean Canal, MD 09/25/12 1202  Richardean Canal, MD 09/25/12 [phone]

## 2012-09-25 NOTE — Progress Notes (Signed)
  Echocardiogram 2D Echocardiogram has been performed.  Travis Nguyen, Travis Nguyen 09/25/2012, 9:46 AM

## 2012-09-25 NOTE — ED Notes (Signed)
Pt c/o recurrent hiccups. Pt treated for same earlier. Pt did not get rx filled because he said the "pills didn't work." States he wanted IV meds.

## 2012-09-26 NOTE — ED Provider Notes (Signed)
History     CSN: [id number]  Arrival date & time 09/25/12  2321   First MD Initiated Contact with Patient 09/25/12 2358      Chief Complaint  Patient presents with  . Hiccups     The history is provided by the patient.   patient reports nearly 24 hours of intermittent hiccups.  He is currently receiving chemotherapy for multiple myeloma and he also has a history of prostate cancer.  He is concerned this may have occurred secondary to chemotherapy.  When it occurs it seems to be severe and last for several hours and then seems to resolve.  He's been drinking a lot of fluids.  His had no other changes in medications.  He has no other complaints at this time.  He was seen earlier in the emergency room for the same and was given Reglan.  He has no complaints at this time.  Past Medical History  Diagnosis Date  . Cancer   . Hypertension     Past Surgical History  Procedure Laterality Date  . Cholecystectomy      Family History  Problem Relation Age of Onset  . Heart failure Mother   . Hypertension Mother   . Heart failure Brother   . Hypertension Brother     History  Substance Use Topics  . Smoking status: Never Smoker   . Smokeless tobacco: Not on file  . Alcohol Use: No      Review of Systems  All other systems reviewed and are negative.    Allergies  Review of patient's allergies indicates no known allergies.  Home Medications   Current Outpatient Rx  Name  Route  Sig  Dispense  Refill  . acyclovir (ZOVIRAX) 400 MG tablet   Oral   Take 400 mg by mouth 2 (two) times daily.         [name] losartan (COZAAR) 100 MG tablet   Oral   Take 100 mg by mouth daily.         . metoCLOPramide (REGLAN) 10 MG tablet   Oral   Take 1 tablet (10 mg total) by mouth every 6 (six) hours as needed (nausea/headache).   15 tablet   0   . ondansetron (ZOFRAN) 8 MG tablet   Oral   Take 8 mg by mouth every 8 (eight) hours as needed for nausea.         [name]  oxyCODONE-acetaminophen (PERCOCET/ROXICET) 5-325 MG per tablet   Oral   Take 2 tablets by mouth every 4 (four) hours as needed for pain.         [name] PRESCRIPTION MEDICATION   Intravenous   Inject 1 application into the vein every 7 (seven) days. Pt gets chemo tx from [facility]. Pt started yesterday           BP 135/74  Pulse 64  Temp(Src) 98.7 F (37.1 C) (Oral)  SpO2 100%  Physical Exam  Constitutional: He is oriented to person, place, and time. He appears well-developed and well-nourished.  HENT:  Head: Normocephalic.  Eyes: EOM are normal.  Neck: Normal range of motion.  Pulmonary/Chest: Effort normal.  Abdominal: He exhibits no distension.  Musculoskeletal: Normal range of motion.  Neurological: He is alert and oriented to person, place, and time.  Psychiatric: He has a normal mood and affect.    ED Course  Procedures (including critical care time)  Labs Reviewed - No data to display No results found.   1.  Hiccups       MDM  No hiccups well-nourished.  No indication for additional treatment in the emergency department.  Pathophysiology was explained and the patient feels much better at this time.  This will likely resolve on its own.        Lyanne Co, MD 09/26/12 0010

## 2012-09-26 NOTE — ED Notes (Signed)
Pt alert x 4  discharge instruction given v/s stable will monitor.

## 2012-10-01 ENCOUNTER — Ambulatory Visit (HOSPITAL_BASED_OUTPATIENT_CLINIC_OR_DEPARTMENT_OTHER): Payer: No Typology Code available for payment source

## 2012-10-01 ENCOUNTER — Encounter: Payer: Self-pay | Admitting: Oncology

## 2012-10-01 ENCOUNTER — Other Ambulatory Visit (HOSPITAL_BASED_OUTPATIENT_CLINIC_OR_DEPARTMENT_OTHER): Payer: No Typology Code available for payment source | Admitting: Lab

## 2012-10-01 ENCOUNTER — Other Ambulatory Visit: Payer: Self-pay | Admitting: Oncology

## 2012-10-01 VITALS — BP 142/74 | HR 64 | Temp 98.3°F

## 2012-10-01 DIAGNOSIS — C9000 Multiple myeloma not having achieved remission: Secondary | ICD-10-CM

## 2012-10-01 DIAGNOSIS — Z5112 Encounter for antineoplastic immunotherapy: Secondary | ICD-10-CM

## 2012-10-01 LAB — CBC WITH DIFFERENTIAL/PLATELET
BASO%: 0 % (ref 0.0–2.0)
Eosinophils Absolute: 0 10*3/uL (ref 0.0–0.5)
LYMPH%: 46.4 % (ref 14.0–49.0)
MCHC: 33.2 g/dL (ref 32.0–36.0)
MONO#: 0.1 10*3/uL (ref 0.1–0.9)
NEUT#: 1.4 10*3/uL — ABNORMAL LOW (ref 1.5–6.5)
RBC: 3.12 10*6/uL — ABNORMAL LOW (ref 4.20–5.82)
RDW: 13.4 % (ref 11.0–14.6)
WBC: 2.8 10*3/uL — ABNORMAL LOW (ref 4.0–10.3)
lymph#: 1.3 10*3/uL (ref 0.9–3.3)
nRBC: 0 % (ref 0–0)

## 2012-10-01 MED ORDER — ONDANSETRON HCL 8 MG PO TABS
8.0000 mg | ORAL_TABLET | Freq: Once | ORAL | Status: AC
  Administered 2012-10-01: 8 mg via ORAL

## 2012-10-01 MED ORDER — BORTEZOMIB CHEMO SQ INJECTION 3.5 MG (2.5MG/ML)
1.3000 mg/m2 | Freq: Once | INTRAMUSCULAR | Status: AC
  Administered 2012-10-01: 2.5 mg via SUBCUTANEOUS
  Filled 2012-10-01: qty 2.5

## 2012-10-01 MED ORDER — DEXAMETHASONE 4 MG PO TABS
40.0000 mg | ORAL_TABLET | Freq: Once | ORAL | Status: AC
  Administered 2012-10-01: 40 mg via ORAL

## 2012-10-01 NOTE — Progress Notes (Signed)
This patient came in for his second course of chemotherapy. His first course of chemotherapy was given on 09/24/2012. At that time his white count was 3.0 and ANC 1.6.  Today, 10/01/2012, white count was 2.8 and ANC was 1.4. I decided to omit the IV Cytoxan. The patient received the same dose of subcutaneous Velcade 2.5 mg. Decadron was given orally rather than IV, 40 mg.  The patient is due to see me again on 10/08/2012. He is scheduled for chemotherapy on that day. Chemotherapy has been scheduled weekly.

## 2012-10-01 NOTE — Patient Instructions (Addendum)
[facility] Discharge Instructions for Patients Receiving Chemotherapy  Today you received the following chemotherapy agents Velcade.  To help prevent nausea and vomiting after your treatment, we encourage you to take your nausea medication as prescribed.   If you develop nausea and vomiting that is not controlled by your nausea medication, call the clinic. If it is after clinic hours your family physician or the after hours number for the clinic or go to the Emergency Department.   BELOW ARE SYMPTOMS THAT SHOULD BE REPORTED IMMEDIATELY:  *FEVER GREATER THAN 100.5 F  *CHILLS WITH OR WITHOUT FEVER  NAUSEA AND VOMITING THAT IS NOT CONTROLLED WITH YOUR NAUSEA MEDICATION  *UNUSUAL SHORTNESS OF BREATH  *UNUSUAL BRUISING OR BLEEDING  TENDERNESS IN MOUTH AND THROAT WITH OR WITHOUT PRESENCE OF ULCERS  *URINARY PROBLEMS  *BOWEL PROBLEMS  UNUSUAL RASH Items with * indicate a potential emergency and should be followed up as soon as possible.  Feel free to call the clinic you have any questions or concerns. The clinic phone number is [phone].   I have been informed and understand all the instructions given to me. I know to contact the clinic, my physician, or go to the Emergency Department if any problems should occur. I do not have any questions at this time, but understand that I may call the clinic during office hours   should I have any questions or need assistance in obtaining follow up care.    __________________________________________  _____________  __________ Signature of Patient or Authorized Representative            Date                   Time    __________________________________________ Nurse's Signature    

## 2012-10-01 NOTE — Progress Notes (Signed)
Ok to treat per Dr. Murinson. 

## 2012-10-06 ENCOUNTER — Encounter: Payer: Self-pay | Admitting: Oncology

## 2012-10-08 ENCOUNTER — Ambulatory Visit (HOSPITAL_BASED_OUTPATIENT_CLINIC_OR_DEPARTMENT_OTHER): Payer: No Typology Code available for payment source | Admitting: Oncology

## 2012-10-08 ENCOUNTER — Telehealth: Payer: Self-pay | Admitting: *Deleted

## 2012-10-08 ENCOUNTER — Other Ambulatory Visit (HOSPITAL_BASED_OUTPATIENT_CLINIC_OR_DEPARTMENT_OTHER): Payer: No Typology Code available for payment source | Admitting: Lab

## 2012-10-08 ENCOUNTER — Ambulatory Visit (HOSPITAL_BASED_OUTPATIENT_CLINIC_OR_DEPARTMENT_OTHER): Payer: No Typology Code available for payment source

## 2012-10-08 ENCOUNTER — Encounter: Payer: Self-pay | Admitting: Oncology

## 2012-10-08 ENCOUNTER — Telehealth: Payer: Self-pay | Admitting: Oncology

## 2012-10-08 VITALS — BP 135/83 | HR 52 | Temp 97.4°F | Resp 20 | Ht 73.5 in | Wt 154.6 lb

## 2012-10-08 DIAGNOSIS — C9000 Multiple myeloma not having achieved remission: Secondary | ICD-10-CM

## 2012-10-08 DIAGNOSIS — Z5112 Encounter for antineoplastic immunotherapy: Secondary | ICD-10-CM

## 2012-10-08 DIAGNOSIS — D61810 Antineoplastic chemotherapy induced pancytopenia: Secondary | ICD-10-CM

## 2012-10-08 LAB — LACTATE DEHYDROGENASE (CC13): LDH: 182 U/L (ref 125–245)

## 2012-10-08 LAB — CBC WITH DIFFERENTIAL/PLATELET
Basophils Absolute: 0 10*3/uL (ref 0.0–0.1)
EOS%: 0.4 % (ref 0.0–7.0)
HCT: 31.6 % — ABNORMAL LOW (ref 38.4–49.9)
HGB: 10.4 g/dL — ABNORMAL LOW (ref 13.0–17.1)
MCH: 32.9 pg (ref 27.2–33.4)
MONO#: 0.1 10*3/uL (ref 0.1–0.9)
NEUT#: 1.4 10*3/uL — ABNORMAL LOW (ref 1.5–6.5)
RDW: 13.5 % (ref 11.0–14.6)
WBC: 2.6 10*3/uL — ABNORMAL LOW (ref 4.0–10.3)
lymph#: 1.1 10*3/uL (ref 0.9–3.3)

## 2012-10-08 LAB — COMPREHENSIVE METABOLIC PANEL (CC13)
ALT: 24 U/L (ref 0–55)
Alkaline Phosphatase: 61 U/L (ref 40–150)
CO2: 26 mEq/L (ref 22–29)
Creatinine: 0.9 mg/dL (ref 0.7–1.3)
Glucose: 104 mg/dl — ABNORMAL HIGH (ref 70–99)
Total Bilirubin: 0.38 mg/dL (ref 0.20–1.20)

## 2012-10-08 MED ORDER — BORTEZOMIB CHEMO SQ INJECTION 3.5 MG (2.5MG/ML)
1.3000 mg/m2 | Freq: Once | INTRAMUSCULAR | Status: AC
  Administered 2012-10-08: 2.5 mg via SUBCUTANEOUS
  Filled 2012-10-08: qty 2.5

## 2012-10-08 MED ORDER — DEXAMETHASONE SODIUM PHOSPHATE 4 MG/ML IJ SOLN
40.0000 mg | Freq: Once | INTRAMUSCULAR | Status: AC
  Administered 2012-10-08: 40 mg via INTRAVENOUS

## 2012-10-08 MED ORDER — SODIUM CHLORIDE 0.9 % IV SOLN
INTRAVENOUS | Status: DC
  Administered 2012-10-08: 11:00:00 via INTRAVENOUS

## 2012-10-08 MED ORDER — ONDANSETRON HCL 8 MG PO TABS
8.0000 mg | ORAL_TABLET | Freq: Once | ORAL | Status: AC
  Administered 2012-10-08: 8 mg via ORAL

## 2012-10-08 MED ORDER — SODIUM CHLORIDE 0.9 % IV SOLN
500.0000 mg | Freq: Once | INTRAVENOUS | Status: AC
  Administered 2012-10-08: 500 mg via INTRAVENOUS
  Filled 2012-10-08: qty 25

## 2012-10-08 NOTE — Telephone Encounter (Signed)
Per staff phone call and POF I have schedueld appts.  JMW  

## 2012-10-08 NOTE — Patient Instructions (Addendum)
[facility] Discharge Instructions for Patients Receiving Chemotherapy  Today you received the following chemotherapy agents dexamethasone, velcade SQ, cytoxan  To help prevent nausea and vomiting after your treatment, we encourage you to take your nausea medication if needed. Begin taking it at 6 pm.    If you develop nausea and vomiting that is not controlled by your nausea medication, call the clinic. If it is after clinic hours your family physician or the after hours number for the clinic or go to the Emergency Department.   BELOW ARE SYMPTOMS THAT SHOULD BE REPORTED IMMEDIATELY:  *FEVER GREATER THAN 100.5 F  *CHILLS WITH OR WITHOUT FEVER  NAUSEA AND VOMITING THAT IS NOT CONTROLLED WITH YOUR NAUSEA MEDICATION  *UNUSUAL SHORTNESS OF BREATH  *UNUSUAL BRUISING OR BLEEDING  TENDERNESS IN MOUTH AND THROAT WITH OR WITHOUT PRESENCE OF ULCERS  *URINARY PROBLEMS  *BOWEL PROBLEMS  UNUSUAL RASH Items with * indicate a potential emergency and should be followed up as soon as possible.   Feel free to call the clinic you have any questions or concerns. The clinic phone number is [phone].   I have been informed and understand all the instructions given to me. I know to contact the clinic, my physician, or go to the Emergency Department if any problems should occur. I do not have any questions at this time, but understand that I may call the clinic during office hours   should I have any questions or need assistance in obtaining follow up care.    __________________________________________  _____________  __________ Signature of Patient or Authorized Representative            Date                   Time    __________________________________________ Nurse's Signature

## 2012-10-08 NOTE — Progress Notes (Signed)
CC:   [name], M.D. [name], MD  PROBLEM LIST:  1. 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 Metastatic bone survey from  08/08/2009 was normal. Urine from 07/24/2009 yielded 27 mg of  protein. Urine immunofixation electrophoresis was positive.  IgG level on 08/17/2012 was 4760. Kappa light chains were 21.90. Beta  2 microglobulin 1.99. Albumin 3.4. Total protein 10.1. Globulins 6.7.  Bone marrow aspirate and biopsy carried out on 08/21/2012 showed 23%  plasma cells. Cytogenetics, flow studies, and FISH studies for  multiple myeloma once again showed an extra chromosome 11.  Stage by the ISS for multiple myeloma is stage II. Metastatic bone survey carried out on 08/28/2012 was negative for evidence of multiple myeloma. A 24-hour urine protein collected on 09/16/2012 was 27 mg. Urine immunofixation electrophoresis showed IgG heavy chains with associated kappa light chains and excess monoclonal free kappa light chains.  The patient started treatment on 09/24/2012.  Treatment consists of Velcade 2.5 mg subcutaneous, Cytoxan 500 mg IV, and Decadron 40 mg IV.  2. Leukopenia and neutropenia.  3. Anemia.  4. Elevated PSA--14.7 on 07/20/2012, whereas PSA on 06/28/2009 had  been 4.25. Patient to have prostate biopsy by Dr. Isabel Caprice.  5. Hypertension.  6. Hemangiomas of the liver seen on ultrasound carried out on  07/31/2009.  7. Lower back pain with radiation into the right leg.  Degenerative disk disease and spinal stenosis seen on an MRI of the  lumbar spine with and without IV contrast on 08/28/2012. There was no  evidence for myeloma or cancer. No pathologic fracture.  8. Status post laparoscopic cholecystectomy carried out in 2002.   MEDICATIONS:  Reviewed and recorded. Current Outpatient Prescriptions  Medication Sig Dispense Refill  . acyclovir  (ZOVIRAX) 400 MG tablet Take 400 mg by mouth 2 (two) times daily.      [name] losartan (COZAAR) 100 MG tablet Take 100 mg by mouth daily.      . metoCLOPramide (REGLAN) 10 MG tablet Take 1 tablet (10 mg total) by mouth every 6 (six) hours as needed (nausea/headache).  15 tablet  0  . ondansetron (ZOFRAN) 8 MG tablet Take 8 mg by mouth every 8 (eight) hours as needed for nausea.      [name] oxyCODONE-acetaminophen (PERCOCET/ROXICET) 5-325 MG per tablet Take 2 tablets by mouth every 4 (four) hours as needed for pain.      [name] PRESCRIPTION MEDICATION Inject 1 application into the vein every 7 (seven) days. Pt gets chemo tx from [facility]. Pt started yesterday       No current facility-administered medications for this visit.   Facility-Administered Medications Ordered in Other Visits  Medication Dose Route Frequency Provider Last Rate Last Dose  . 0.9 %  sodium chloride infusion   Intravenous Continuous Samul Dada, MD        TREATMENT PROGRAM: 1. Velcade 1.3 mg/m2, equal to 2.5 mg subcu weekly.  2. Cytoxan 300 mg per meter squared equal to 500 mg IV weekly.  3. Decadron 40 mg IV weekly. Chemotherapy was started on 09/24/2012.    IMMUNIZATIONS: Flu shot was given in December 2013.   SMOKING HISTORY: The patient smoked up to 2 packs of cigarettes a day  for 5 years. He has not smoked since his mid 38s.     HISTORY:  Travis Nguyen was seen today for followup of his IgG  kappa multiple myeloma.  Travis Nguyen started his chemotherapy treatments on 09/24/2012.  He received subcutaneous Velcade, IV Cytoxan and IV Decadron.  On 10/02/2011, I was concerned about persistent leukopenia and neutropenia.  We decided to hold the IV Cytoxan.  Travis Nguyen received Velcade 2.5 mg subcutaneously and oral Decadron 40 mg p.o.  It will be recalled that he has had pre-existing leukopenia and thrombocytopenia dating back to the first time I saw him in April 2010.  I suspect this is not directly due to  his multiple myeloma, but Travis Nguyen may have some immune dysregulation.  Clinically, Travis Nguyen has done well with his treatments.  He noted some constipation.  We talked about laxatives, and I wrote a several suggestions down for him and his wife, who accompanies him today.  Those suggestions include Senokot S, MiraLAX, milk of magnesia and fiber. Travis Nguyen notes some increased energy from his treatments.  He had hiccups following his first treatment.  Travis Nguyen is still having some back pain. He has applied for disability.  He has not worked since February primarily because of his back, which is due to degenerative disease and spinal stenosis as noted by MRI of the lumbar spine on 08/28/2012. Travis Nguyen was last seen here on 09/14/2012 and started his chemotherapy on 03/13.  This is cycle #3.  PHYSICAL EXAMINATION:  There is little change.  Weight today is 154 pounds 9.6 ounces.  Weight is stable.  Height 6 feet 1-1/2 inches.  Body surface area 1.91 sq m.  Blood pressure 135/83.  Other vital signs are normal, although pulse today is 52 and regular.  There is no scleral icterus. Mouth and pharynx are benign. Dentition is poor. There is no adenopathy palpable. Lungs: Clear to percussion and auscultation. Cardiac exam: A short early systolic ejection murmur was present. Abdomen: Benign with no organomegaly or masses palpable. There was no tenderness over the lumbar spine, which is where the patient localizes his pain. No axillary or inguinal adenopathy. Extremities: No peripheral edema. No petechiae or purpura.  Neurologic: Nonfocal.    LABORATORY DATA:  White count 2.6, ANC 1.4, hemoglobin 10.4, hematocrit 31.6, platelets 151,000.  Chemistries today along with, IgG level, serum light chains are pending.  On 09/24/2012, the IgG level was 5060 and the serum kappa light chain was 11.10.  That was at the start of treatment. A 24-hour urine collection for protein collected on 09/16/2012 came to 27 mg.   The urine immunofixation electrophoresis was positive.  IMAGING STUDIES:  1. Abdominal ultrasound carried out on 07/31/2009 showed  a hyperechoic lesion in the right lobe of the liver which basically had  not changed compared with the study carried out on 08/09/2006, felt to  be most consistent with a hemangioma. The patient was status post  cholecystectomy. Spleen was normal.  2. Metastatic bone survey on 08/08/2009 showed no lytic bone lesions.  3. Lumbar spine complete 4 views from 10/14/2011 showed L5-S1 and to a  lesser extent L4-L5 degenerative disk space narrowing. Otherwise, a  negative study.  4. Sacrum and coccyx 2 views showed moderate bilateral hip joint  degenerative changes. There were femoral head subchondral cystic  changes and sclerosis, greater on the right. There were prominent  degenerative changes at L5-S1.  5. Chest x-ray 2 view from 08/03/2012 showed no active cardiopulmonary  Disease.  6. Metastatic bone survey from 08/28/2012 was negative for evidence of  multiple myeloma or any destructive lesions.  7. MRI of the lumbar spine with and without IV contrast from  08/28/2012  showed no evidence for multiple myeloma. There were no pathologic  fractures. There was heterogeneous marrow signal. There was  degenerative disk disease at L4-L5 and L5-S1. There was mild L4-L5  central stenosis and bilateral lateral recess stenosis which potentially  affects the descending L5 nerves. The L5-S1 left lateral recess  encroachment potentially affects the descending left S1 nerve.  8. Nuclear medicine whole body bone scan from 08/28/2012 showed activity  within multiple joints, most likely arthritic or degenerative. There  was no pattern to suggest skeletal metastatic disease.   IMPRESSION AND PLAN:  Travis Nguyen seems to be tolerating his treatments okay.  His leukopenia, mild neutropenia and thrombocytopenia are somewhat limiting.  Today we will go ahead with full doses of  the scheduled chemotherapy, cycle #3 as follows:  Velcade 2.5 mg subcutaneous, Cytoxan 500 mg IV, Decadron 40 mg IV.  I have set Travis Nguyen up for weekly CBCs and chemotherapy.  We will make adjustments in the chemotherapy as indicated by any significant drop in his blood counts.  He continues to have mild pancytopenia.  We will plan to see Travis Nguyen again in 1 month, which will be April 24th, at which time we will check CBC, chemistries, uric acid, IgG level and serum light chains.  As stated, we will continue with weekly chemotherapy and CBC on 04/03, 04/10, and 04/17.  Travis Nguyen tells me that he is scheduled for prostate biopsy by Dr. Isabel Caprice on the afternoon of 11/05/2012.  Apparently he is also going to be referred to a neurosurgeon for evaluation of his back pain and abnormal MRI.    ______________________________ Samul Dada, M.D. DSM/MEDQ  D:  10/08/2012  T:  10/08/2012  Job:  [id number]

## 2012-10-08 NOTE — Telephone Encounter (Signed)
Gave pt appt for lab,md and chemo for April 2014

## 2012-10-08 NOTE — Progress Notes (Signed)
This office note has been dictated.  #[id number]

## 2012-10-09 LAB — KAPPA/LAMBDA LIGHT CHAINS
Kappa free light chain: 6.25 mg/dL — ABNORMAL HIGH (ref 0.33–1.94)
Kappa:Lambda Ratio: 10.25 — ABNORMAL HIGH (ref 0.26–1.65)
Lambda Free Lght Chn: 0.61 mg/dL (ref 0.57–2.63)

## 2012-10-09 LAB — IGG: IgG (Immunoglobin G), Serum: 4550 mg/dL — ABNORMAL HIGH (ref 650–1600)

## 2012-10-10 ENCOUNTER — Encounter (HOSPITAL_COMMUNITY): Payer: Self-pay | Admitting: Emergency Medicine

## 2012-10-10 ENCOUNTER — Emergency Department (HOSPITAL_COMMUNITY)
Admission: EM | Admit: 2012-10-10 | Discharge: 2012-10-10 | Disposition: A | Discharge status: Home / Self Care | Payer: No Typology Code available for payment source | Attending: Emergency Medicine | Admitting: Emergency Medicine

## 2012-10-10 DIAGNOSIS — Y9389 Activity, other specified: Secondary | ICD-10-CM | POA: Insufficient documentation

## 2012-10-10 DIAGNOSIS — Z8589 Personal history of malignant neoplasm of other organs and systems: Secondary | ICD-10-CM | POA: Insufficient documentation

## 2012-10-10 DIAGNOSIS — Z79899 Other long term (current) drug therapy: Secondary | ICD-10-CM | POA: Insufficient documentation

## 2012-10-10 DIAGNOSIS — X58XXXA Exposure to other specified factors, initial encounter: Secondary | ICD-10-CM | POA: Insufficient documentation

## 2012-10-10 DIAGNOSIS — S058X9A Other injuries of unspecified eye and orbit, initial encounter: Secondary | ICD-10-CM | POA: Insufficient documentation

## 2012-10-10 DIAGNOSIS — S0502XA Injury of conjunctiva and corneal abrasion without foreign body, left eye, initial encounter: Secondary | ICD-10-CM

## 2012-10-10 DIAGNOSIS — Y929 Unspecified place or not applicable: Secondary | ICD-10-CM | POA: Insufficient documentation

## 2012-10-10 DIAGNOSIS — I10 Essential (primary) hypertension: Secondary | ICD-10-CM | POA: Insufficient documentation

## 2012-10-10 DIAGNOSIS — H5789 Other specified disorders of eye and adnexa: Secondary | ICD-10-CM | POA: Insufficient documentation

## 2012-10-10 MED ORDER — FLUORESCEIN SODIUM 1 MG OP STRP
1.0000 | ORAL_STRIP | Freq: Once | OPHTHALMIC | Status: AC
  Administered 2012-10-10: 05:00:00 via OPHTHALMIC
  Filled 2012-10-10: qty 1

## 2012-10-10 MED ORDER — TETRACAINE HCL 0.5 % OP SOLN
1.0000 [drp] | Freq: Once | OPHTHALMIC | Status: AC
  Administered 2012-10-10: 1 [drp] via OPHTHALMIC
  Filled 2012-10-10: qty 2

## 2012-10-10 MED ORDER — ERYTHROMYCIN 5 MG/GM OP OINT
TOPICAL_OINTMENT | Freq: Once | OPHTHALMIC | Status: AC
  Administered 2012-10-10: 06:00:00 via OPHTHALMIC
  Filled 2012-10-10 (×3): qty 1

## 2012-10-10 NOTE — ED Notes (Addendum)
Pt states he heard and felt a "pop" in his L eye yesterday around 2pm and then eye became red and painful.  Denies difficulty with vision. States his BP was elevated yesterday.  Pt takes chemo at [facility].

## 2012-10-10 NOTE — ED Provider Notes (Addendum)
History     CSN: [id number]  Arrival date & time 10/10/12  0413   First MD Initiated Contact with Patient 10/10/12 0444      Chief Complaint  Patient presents with  . Eye Pain    (Consider location/radiation/quality/duration/timing/severity/associated sxs/prior treatment) HPI 62 year old male presents to emergency room complaining of pain and redness to his left eye.  Patient reports he has not been taking his blood pressure medicine as prescribed.  Yesterday afternoon around 2, he felt that his blood pressure was high.  He reports hearing a pop from his left eye, followed by some blurred vision, redness, and dull pain.  Patient was concerned that he might have a blocked blood vessel in his eye or maybe a stroke, and when he could not sleep tonight came to the emergency room for evaluation.  Past Medical History  Diagnosis Date  . Cancer   . Hypertension     Past Surgical History  Procedure Laterality Date  . Cholecystectomy      Family History  Problem Relation Age of Onset  . Heart failure Mother   . Hypertension Mother   . Heart failure Brother   . Hypertension Brother     History  Substance Use Topics  . Smoking status: Never Smoker   . Smokeless tobacco: Not on file  . Alcohol Use: No      Review of Systems  All other systems reviewed and are negative.    Allergies  Review of patient's allergies indicates no known allergies.  Home Medications   Current Outpatient Rx  Name  Route  Sig  Dispense  Refill  . acyclovir (ZOVIRAX) 400 MG tablet   Oral   Take 400 mg by mouth 2 (two) times daily.         [name] losartan (COZAAR) 100 MG tablet   Oral   Take 100 mg by mouth daily.         . metoCLOPramide (REGLAN) 10 MG tablet   Oral   Take 1 tablet (10 mg total) by mouth every 6 (six) hours as needed (nausea/headache).   15 tablet   0   . ondansetron (ZOFRAN) 8 MG tablet   Oral   Take 8 mg by mouth every 8 (eight) hours as needed for nausea.          [name] oxyCODONE-acetaminophen (PERCOCET/ROXICET) 5-325 MG per tablet   Oral   Take 2 tablets by mouth every 4 (four) hours as needed for pain.         [name] PRESCRIPTION MEDICATION   Intravenous   Inject 1 application into the vein every 7 (seven) days. Pt gets chemo tx from [facility]. Pt started yesterday           BP 119/76  Pulse 56  Temp(Src) 97.5 F (36.4 C) (Oral)  Resp 18  SpO2 100%  Physical Exam  Nursing note and vitals reviewed. Constitutional: He is oriented to person, place, and time. He appears well-developed and well-nourished.  HENT:  Head: Normocephalic and atraumatic.  Nose: Nose normal.  Mouth/Throat: Oropharynx is clear and moist.  Eyes: Conjunctivae and EOM are normal. Pupils are equal, round, and reactive to light.  Left eye with conjunctival injection.  Extraocular muscles are intact bilaterally.  Visual acuity was noted.   Both eyes were numbed with tetracaine.  Tono-Pen readings were 21 in the right eye, 14 in the left eye.  Patient reports resolution of pain after application of tetracaine.  Using the  Woods lamp, corneal abrasion noted. Fundoscopic exam without significant findings  Neck: Normal range of motion. Neck supple. No JVD present. No tracheal deviation present. No thyromegaly present.  Cardiovascular: Normal rate, regular rhythm, normal heart sounds and intact distal pulses.  Exam reveals no gallop and no friction rub.   No murmur heard. Pulmonary/Chest: Effort normal and breath sounds normal. No stridor. No respiratory distress. He has no wheezes. He has no rales. He exhibits no tenderness.  Musculoskeletal: Normal range of motion. He exhibits no edema and no tenderness.  Lymphadenopathy:    He has no cervical adenopathy.  Neurological: He is alert and oriented to person, place, and time. He has normal reflexes. No cranial nerve deficit. He exhibits normal muscle tone. Coordination normal.  Skin: Skin is warm and dry. No rash  noted. No erythema. No pallor.  Psychiatric: He has a normal mood and affect. His behavior is normal. Judgment and thought content normal.    ED Course  Procedures (including critical care time)  Labs Reviewed - No data to display No results found.   1. Corneal abrasion, left, initial encounter       MDM  62 year old male with corneal abrasion.  Will have him follow closely with ophthalmology.  Given the significant decrease in his vision.  Given the normal pressures in the affected eye, do not feel this is acute angle glaucoma.  Funduscopic exam without significant findings.        Olivia Mackie, MD 10/10/12 2841  Olivia Mackie, MD 10/10/12 [phone]

## 2012-10-15 ENCOUNTER — Ambulatory Visit (HOSPITAL_BASED_OUTPATIENT_CLINIC_OR_DEPARTMENT_OTHER): Payer: No Typology Code available for payment source

## 2012-10-15 ENCOUNTER — Encounter: Payer: Self-pay | Admitting: Oncology

## 2012-10-15 ENCOUNTER — Other Ambulatory Visit (HOSPITAL_BASED_OUTPATIENT_CLINIC_OR_DEPARTMENT_OTHER): Payer: No Typology Code available for payment source | Admitting: Lab

## 2012-10-15 VITALS — BP 126/66 | HR 67 | Temp 98.7°F

## 2012-10-15 DIAGNOSIS — Z5111 Encounter for antineoplastic chemotherapy: Secondary | ICD-10-CM

## 2012-10-15 DIAGNOSIS — C9000 Multiple myeloma not having achieved remission: Secondary | ICD-10-CM

## 2012-10-15 DIAGNOSIS — Z5112 Encounter for antineoplastic immunotherapy: Secondary | ICD-10-CM

## 2012-10-15 LAB — CBC WITH DIFFERENTIAL/PLATELET
Eosinophils Absolute: 0 10*3/uL (ref 0.0–0.5)
HCT: 29.9 % — ABNORMAL LOW (ref 38.4–49.9)
LYMPH%: 38.1 % (ref 14.0–49.0)
MCV: 100 fL — ABNORMAL HIGH (ref 79.3–98.0)
MONO#: 0.1 10*3/uL (ref 0.1–0.9)
NEUT#: 1.7 10*3/uL (ref 1.5–6.5)
NEUT%: 59.2 % (ref 39.0–75.0)
Platelets: 150 10*3/uL (ref 140–400)
WBC: 2.9 10*3/uL — ABNORMAL LOW (ref 4.0–10.3)

## 2012-10-15 MED ORDER — BORTEZOMIB CHEMO SQ INJECTION 3.5 MG (2.5MG/ML)
1.3000 mg/m2 | Freq: Once | INTRAMUSCULAR | Status: AC
  Administered 2012-10-15: 2.5 mg via SUBCUTANEOUS
  Filled 2012-10-15: qty 2.5

## 2012-10-15 MED ORDER — SODIUM CHLORIDE 0.9 % IV SOLN
500.0000 mg | Freq: Once | INTRAVENOUS | Status: AC
  Administered 2012-10-15: 500 mg via INTRAVENOUS
  Filled 2012-10-15: qty 25

## 2012-10-15 MED ORDER — DEXAMETHASONE SODIUM PHOSPHATE 4 MG/ML IJ SOLN
40.0000 mg | Freq: Once | INTRAMUSCULAR | Status: AC
  Administered 2012-10-15: 40 mg via INTRAVENOUS

## 2012-10-15 MED ORDER — ONDANSETRON HCL 8 MG PO TABS
8.0000 mg | ORAL_TABLET | Freq: Once | ORAL | Status: AC
  Administered 2012-10-15: 8 mg via ORAL

## 2012-10-15 NOTE — Progress Notes (Signed)
Ok to treat per Dr. Murinson. 

## 2012-10-15 NOTE — Patient Instructions (Addendum)
[facility] Discharge Instructions for Patients Receiving Chemotherapy  Today you received the following chemotherapy agents Velcade/Cytoxan.  To help prevent nausea and vomiting after your treatment, we encourage you to take your nausea medication as prescribed.   If you develop nausea and vomiting that is not controlled by your nausea medication, call the clinic. If it is after clinic hours your family physician or the after hours number for the clinic or go to the Emergency Department.   BELOW ARE SYMPTOMS THAT SHOULD BE REPORTED IMMEDIATELY:  *FEVER GREATER THAN 100.5 F  *CHILLS WITH OR WITHOUT FEVER  NAUSEA AND VOMITING THAT IS NOT CONTROLLED WITH YOUR NAUSEA MEDICATION  *UNUSUAL SHORTNESS OF BREATH  *UNUSUAL BRUISING OR BLEEDING  TENDERNESS IN MOUTH AND THROAT WITH OR WITHOUT PRESENCE OF ULCERS  *URINARY PROBLEMS  *BOWEL PROBLEMS  UNUSUAL RASH Items with * indicate a potential emergency and should be followed up as soon as possible.  One of the nurses will contact you 24 hours after your treatment. Please let the nurse know about any problems that you may have experienced. Feel free to call the clinic you have any questions or concerns. The clinic phone number is [phone].   I have been informed and understand all the instructions given to me. I know to contact the clinic, my physician, or go to the Emergency Department if any problems should occur. I do not have any questions at this time, but understand that I may call the clinic during office hours   should I have any questions or need assistance in obtaining follow up care.    __________________________________________  _____________  __________ Signature of Patient or Authorized Representative            Date                   Time    __________________________________________ Nurse's Signature    

## 2012-10-15 NOTE — Progress Notes (Signed)
Spoke to pt regarding ins.  He has applied for disability as well as for Medicaid.  I gave pt an EPP application.  I will process it when he returns it with the needed info.

## 2012-10-22 ENCOUNTER — Ambulatory Visit (HOSPITAL_BASED_OUTPATIENT_CLINIC_OR_DEPARTMENT_OTHER): Payer: No Typology Code available for payment source

## 2012-10-22 ENCOUNTER — Other Ambulatory Visit (HOSPITAL_BASED_OUTPATIENT_CLINIC_OR_DEPARTMENT_OTHER): Payer: No Typology Code available for payment source | Admitting: Lab

## 2012-10-22 ENCOUNTER — Telehealth: Payer: Self-pay | Admitting: Oncology

## 2012-10-22 ENCOUNTER — Telehealth: Payer: Self-pay | Admitting: *Deleted

## 2012-10-22 ENCOUNTER — Ambulatory Visit (HOSPITAL_BASED_OUTPATIENT_CLINIC_OR_DEPARTMENT_OTHER): Payer: No Typology Code available for payment source | Admitting: Oncology

## 2012-10-22 ENCOUNTER — Encounter: Payer: Self-pay | Admitting: Oncology

## 2012-10-22 VITALS — BP 131/72 | HR 73 | Temp 97.1°F | Resp 18 | Ht 73.5 in | Wt 157.4 lb

## 2012-10-22 DIAGNOSIS — Z5112 Encounter for antineoplastic immunotherapy: Secondary | ICD-10-CM

## 2012-10-22 DIAGNOSIS — Z23 Encounter for immunization: Secondary | ICD-10-CM

## 2012-10-22 DIAGNOSIS — Z5111 Encounter for antineoplastic chemotherapy: Secondary | ICD-10-CM

## 2012-10-22 DIAGNOSIS — C9000 Multiple myeloma not having achieved remission: Secondary | ICD-10-CM

## 2012-10-22 LAB — COMPREHENSIVE METABOLIC PANEL (CC13)
BUN: 27.6 mg/dL — ABNORMAL HIGH (ref 7.0–26.0)
CO2: 26 mEq/L (ref 22–29)
Creatinine: 1 mg/dL (ref 0.7–1.3)
Glucose: 92 mg/dl (ref 70–99)
Total Bilirubin: 0.2 mg/dL (ref 0.20–1.20)

## 2012-10-22 LAB — URIC ACID (CC13): Uric Acid, Serum: 7.1 mg/dl (ref 2.6–7.4)

## 2012-10-22 LAB — CBC WITH DIFFERENTIAL/PLATELET
Basophils Absolute: 0 10*3/uL (ref 0.0–0.1)
EOS%: 0.4 % (ref 0.0–7.0)
HCT: 30.3 % — ABNORMAL LOW (ref 38.4–49.9)
HGB: 10.2 g/dL — ABNORMAL LOW (ref 13.0–17.1)
LYMPH%: 40.5 % (ref 14.0–49.0)
MCH: 33.7 pg — ABNORMAL HIGH (ref 27.2–33.4)
MCV: 100 fL — ABNORMAL HIGH (ref 79.3–98.0)
MONO%: 3.1 % (ref 0.0–14.0)
NEUT%: 55.6 % (ref 39.0–75.0)
Platelets: 183 10*3/uL (ref 140–400)

## 2012-10-22 LAB — LACTATE DEHYDROGENASE (CC13): LDH: 140 U/L (ref 125–245)

## 2012-10-22 MED ORDER — ONDANSETRON HCL 8 MG PO TABS
8.0000 mg | ORAL_TABLET | Freq: Once | ORAL | Status: AC
  Administered 2012-10-22: 8 mg via ORAL

## 2012-10-22 MED ORDER — BORTEZOMIB CHEMO SQ INJECTION 3.5 MG (2.5MG/ML)
1.3000 mg/m2 | Freq: Once | INTRAMUSCULAR | Status: AC
  Administered 2012-10-22: 2.5 mg via SUBCUTANEOUS
  Filled 2012-10-22: qty 2.5

## 2012-10-22 MED ORDER — PNEUMOCOCCAL VAC POLYVALENT 25 MCG/0.5ML IJ INJ
0.5000 mL | INJECTION | Freq: Once | INTRAMUSCULAR | Status: AC
  Administered 2012-10-22: 0.5 mL via INTRAMUSCULAR
  Filled 2012-10-22: qty 0.5

## 2012-10-22 MED ORDER — DEXAMETHASONE SODIUM PHOSPHATE 4 MG/ML IJ SOLN
40.0000 mg | Freq: Once | INTRAMUSCULAR | Status: AC
  Administered 2012-10-22: 40 mg via INTRAVENOUS

## 2012-10-22 MED ORDER — SODIUM CHLORIDE 0.9 % IV SOLN
500.0000 mg | Freq: Once | INTRAVENOUS | Status: AC
  Administered 2012-10-22: 500 mg via INTRAVENOUS
  Filled 2012-10-22: qty 25

## 2012-10-22 NOTE — Telephone Encounter (Signed)
gve the pt his April,may 2014 appt calendar

## 2012-10-22 NOTE — Progress Notes (Signed)
This office note has been dictated.  #[id number]

## 2012-10-22 NOTE — Patient Instructions (Signed)
We are going to continue weekly labs and chemo.  We will see you for your next appointment with me on 5/15.

## 2012-10-22 NOTE — Telephone Encounter (Signed)
Per staff phone call and POF I have schedueld appts.  JMW  

## 2012-10-22 NOTE — Patient Instructions (Addendum)
[facility] Discharge Instructions for Patients Receiving Chemotherapy  Today you received the following chemotherapy agents; Cytoxan and Velcade.  To help prevent nausea and vomiting after your treatment, we encourage you to take your nausea medication as directed.  If you develop nausea and vomiting that is not controlled by your nausea medication, call the clinic. If it is after clinic hours your family physician or the after hours number for the clinic or go to the Emergency Department.   BELOW ARE SYMPTOMS THAT SHOULD BE REPORTED IMMEDIATELY:  *FEVER GREATER THAN 100.5 F  *CHILLS WITH OR WITHOUT FEVER  NAUSEA AND VOMITING THAT IS NOT CONTROLLED WITH YOUR NAUSEA MEDICATION  *UNUSUAL SHORTNESS OF BREATH  *UNUSUAL BRUISING OR BLEEDING  TENDERNESS IN MOUTH AND THROAT WITH OR WITHOUT PRESENCE OF ULCERS  *URINARY PROBLEMS  *BOWEL PROBLEMS  UNUSUAL RASH Items with * indicate a potential emergency and should be followed up as soon as possible.  Feel free to call the clinic you have any questions or concerns. The clinic phone number is [phone].   I have been informed and understand all the instructions given to me. I know to contact the clinic, my physician, or go to the Emergency Department if any problems should occur. I do not have any questions at this time, but understand that I may call the clinic during office hours   should I have any questions or need assistance in obtaining follow up care.    __________________________________________  _____________  __________ Signature of Patient or Authorized Representative            Date                   Time    __________________________________________ Nurse's Signature

## 2012-10-23 ENCOUNTER — Emergency Department (HOSPITAL_COMMUNITY)
Admission: EM | Admit: 2012-10-23 | Discharge: 2012-10-23 | Disposition: A | Discharge status: Home / Self Care | Payer: No Typology Code available for payment source | Attending: Emergency Medicine | Admitting: Emergency Medicine

## 2012-10-23 ENCOUNTER — Encounter (HOSPITAL_COMMUNITY): Payer: Self-pay | Admitting: *Deleted

## 2012-10-23 DIAGNOSIS — Z9221 Personal history of antineoplastic chemotherapy: Secondary | ICD-10-CM | POA: Insufficient documentation

## 2012-10-23 DIAGNOSIS — I10 Essential (primary) hypertension: Secondary | ICD-10-CM | POA: Insufficient documentation

## 2012-10-23 DIAGNOSIS — C801 Malignant (primary) neoplasm, unspecified: Secondary | ICD-10-CM | POA: Insufficient documentation

## 2012-10-23 DIAGNOSIS — R112 Nausea with vomiting, unspecified: Secondary | ICD-10-CM

## 2012-10-23 DIAGNOSIS — Z79899 Other long term (current) drug therapy: Secondary | ICD-10-CM | POA: Insufficient documentation

## 2012-10-23 DIAGNOSIS — R197 Diarrhea, unspecified: Secondary | ICD-10-CM | POA: Insufficient documentation

## 2012-10-23 DIAGNOSIS — R1012 Left upper quadrant pain: Secondary | ICD-10-CM | POA: Insufficient documentation

## 2012-10-23 LAB — CBC WITH DIFFERENTIAL/PLATELET
Basophils Relative: 0 % (ref 0–1)
Eosinophils Absolute: 0 10*3/uL (ref 0.0–0.7)
Eosinophils Relative: 0 % (ref 0–5)
MCH: 34.1 pg — ABNORMAL HIGH (ref 26.0–34.0)
MCHC: 34.3 g/dL (ref 30.0–36.0)
Neutrophils Relative %: 92 % — ABNORMAL HIGH (ref 43–77)
Platelets: 190 10*3/uL (ref 150–400)
RDW: 14.2 % (ref 11.5–15.5)

## 2012-10-23 LAB — COMPREHENSIVE METABOLIC PANEL
ALT: 48 U/L (ref 0–53)
Albumin: 3.5 g/dL (ref 3.5–5.2)
Alkaline Phosphatase: 51 U/L (ref 39–117)
Calcium: 8.7 mg/dL (ref 8.4–10.5)
Potassium: 4.1 mEq/L (ref 3.5–5.1)
Sodium: 132 mEq/L — ABNORMAL LOW (ref 135–145)
Total Protein: 9.5 g/dL — ABNORMAL HIGH (ref 6.0–8.3)

## 2012-10-23 LAB — KAPPA/LAMBDA LIGHT CHAINS
Kappa:Lambda Ratio: 55.71 — ABNORMAL HIGH (ref 0.26–1.65)
Lambda Free Lght Chn: 0.21 mg/dL — ABNORMAL LOW (ref 0.57–2.63)

## 2012-10-23 LAB — LIPASE, BLOOD: Lipase: 23 U/L (ref 11–59)

## 2012-10-23 MED ORDER — MORPHINE SULFATE 4 MG/ML IJ SOLN
4.0000 mg | Freq: Once | INTRAMUSCULAR | Status: AC
  Administered 2012-10-23: 4 mg via INTRAVENOUS
  Filled 2012-10-23: qty 1

## 2012-10-23 MED ORDER — ONDANSETRON HCL 4 MG/2ML IJ SOLN
4.0000 mg | Freq: Once | INTRAMUSCULAR | Status: AC
  Administered 2012-10-23: 4 mg via INTRAVENOUS
  Filled 2012-10-23: qty 2

## 2012-10-23 MED ORDER — OXYCODONE-ACETAMINOPHEN 5-325 MG PO TABS: 1.0000 | ORAL_TABLET | Freq: Four times a day (QID) | ORAL | Status: DC | PRN

## 2012-10-23 MED ORDER — SODIUM CHLORIDE 0.9 % IV BOLUS (SEPSIS)
1000.0000 mL | Freq: Once | INTRAVENOUS | Status: AC
  Administered 2012-10-23: 1000 mL via INTRAVENOUS

## 2012-10-23 NOTE — ED Notes (Signed)
[id number]> Expected date:<BR> Expected time:<BR> Means of arrival:<BR> Comments:<BR> Mayford Knife

## 2012-10-23 NOTE — ED Notes (Signed)
Pt given pitcher of water.

## 2012-10-23 NOTE — ED Notes (Signed)
Pt tolerating PO fluids, denies nausea at this time.

## 2012-10-23 NOTE — ED Provider Notes (Addendum)
History     CSN: [id number]  Arrival date & time 10/23/12  1246   First MD Initiated Contact with Patient 10/23/12 1258      Chief Complaint  Patient presents with  . Emesis  . Diarrhea    (Consider location/radiation/quality/duration/timing/severity/associated sxs/prior treatment) HPI Comments: All sx started after pt received his 4th dose of chemo for multiple myeloma yesterday  Patient is a 62 y.o. male presenting with vomiting and diarrhea. The history is provided by the patient.  Emesis Severity:  Moderate Duration:  24 hours Timing:  Intermittent Number of daily episodes:  Multiple Quality:  Stomach contents Progression:  Unchanged Chronicity:  New Recent urination:  Normal Relieved by: initially improved after GERD meds but then returned. Associated symptoms: abdominal pain and diarrhea   Associated symptoms: no chills, no cough, no myalgias and no URI   Abdominal pain:    Location:  LUQ   Quality:  Cramping   Severity:  Moderate   Duration:  24 hours   Timing:  Intermittent   Progression:  Resolved   Chronicity:  New Diarrhea:    Quality:  Watery   Number of occurrences:  Multiple   Severity:  Moderate   Duration:  24 hours   Timing:  Constant   Progression:  Unchanged Risk factors: no alcohol use and no diabetes   Diarrhea Associated symptoms: abdominal pain and vomiting   Associated symptoms: no chills, no recent cough, no fever, no myalgias and no URI     Past Medical History  Diagnosis Date  . Cancer   . Hypertension     Past Surgical History  Procedure Laterality Date  . Cholecystectomy      Family History  Problem Relation Age of Onset  . Heart failure Mother   . Hypertension Mother   . Heart failure Brother   . Hypertension Brother     History  Substance Use Topics  . Smoking status: Never Smoker   . Smokeless tobacco: Not on file  . Alcohol Use: No      Review of Systems  Constitutional: Negative for fever and chills.   Gastrointestinal: Positive for vomiting, abdominal pain and diarrhea.  Musculoskeletal: Negative for myalgias.  All other systems reviewed and are negative.    Allergies  Review of patient's allergies indicates no known allergies.  Home Medications   Current Outpatient Rx  Name  Route  Sig  Dispense  Refill  . acyclovir (ZOVIRAX) 400 MG tablet   Oral   Take 400 mg by mouth 2 (two) times daily.         [name] losartan (COZAAR) 100 MG tablet   Oral   Take 100 mg by mouth daily.         . metoCLOPramide (REGLAN) 10 MG tablet   Oral   Take 1 tablet (10 mg total) by mouth every 6 (six) hours as needed (nausea/headache).   15 tablet   0   . ondansetron (ZOFRAN) 8 MG tablet   Oral   Take 8 mg by mouth every 8 (eight) hours as needed for nausea.         [name] oxyCODONE-acetaminophen (PERCOCET/ROXICET) 5-325 MG per tablet   Oral   Take 2 tablets by mouth every 4 (four) hours as needed for pain.         [name] PRESCRIPTION MEDICATION   Intravenous   Inject 1 application into the vein every 7 (seven) days. Pt gets chemo tx from [facility]. Pt gets  this every 7 days. Pt will cont to get this treatment every Thursday           BP 123/78  Pulse 80  Temp(Src) 98.7 F (37.1 C) (Oral)  Resp 16  SpO2 96%  Physical Exam  Nursing note and vitals reviewed. Constitutional: He is oriented to person, place, and time. He appears well-developed and well-nourished. No distress.  HENT:  Head: Normocephalic and atraumatic.  Mouth/Throat: Oropharynx is clear and moist. Mucous membranes are dry.  Eyes: Conjunctivae and EOM are normal. Pupils are equal, round, and reactive to light.  Neck: Normal range of motion. Neck supple.  Cardiovascular: Normal rate, regular rhythm and intact distal pulses.   No murmur heard. Pulmonary/Chest: Effort normal and breath sounds normal. No respiratory distress. He has no wheezes. He has no rales.  Abdominal: Soft. He exhibits no distension.  There is no tenderness. There is no rebound and no guarding.  Musculoskeletal: Normal range of motion. He exhibits no edema and no tenderness.  Neurological: He is alert and oriented to person, place, and time.  Skin: Skin is warm and dry. No rash noted. No erythema.  Psychiatric: He has a normal mood and affect. His behavior is normal.    ED Course  Procedures (including critical care time)  Labs Reviewed  CBC WITH DIFFERENTIAL - Abnormal; Notable for the following:    WBC 3.4 (*)    RBC 3.08 (*)    Hemoglobin 10.5 (*)    HCT 30.6 (*)    MCH 34.1 (*)    Neutrophils Relative 92 (*)    Lymphocytes Relative 6 (*)    Lymphs Abs 0.2 (*)    Monocytes Relative 2 (*)    All other components within normal limits  COMPREHENSIVE METABOLIC PANEL - Abnormal; Notable for the following:    Sodium 132 (*)    Glucose, Bld 108 (*)    Total Protein 9.5 (*)    All other components within normal limits  LIPASE, BLOOD  URINALYSIS, ROUTINE W REFLEX MICROSCOPIC   No results found.   1. Nausea vomiting and diarrhea       MDM   Patient here with a history of multiple myeloma who currently started chemotherapy treatments and received his fourth chemotherapy treatment yesterday. Since yesterday patient has had nausea, vomiting, diarrhea and intermittent cramping left upper quadrant pain. He denies any hematemesis or blood in his stool. He currently is having no abdominal pain. He states initially the pain was improved after the GERD medication but not returned and been intermittent. Patient states that he's had this problem with the chemotherapy before now but does not know if they change the dose or the medicine. He denies any fevers. His last chemotherapy treatment before yesterday was a week ago. Patient appears dehydrated on exam but otherwise has no focal abdominal tenderness and exam is normal. Feel that patient is having typical chemotherapy adverse effects. Patient given IV fluids, pain and  nausea medication. CBC, CMP, lipase, UA pending.  3:04 PM Labs wnl.  Pt feeling better after morphine and zofran.  Will d/c home with pain control and has zofran at home.      Gwyneth Sprout, MD 10/23/12 1505  Gwyneth Sprout, MD 10/23/12 1510

## 2012-10-23 NOTE — Progress Notes (Signed)
CC:   [name], M.D. [name], MD   PROBLEM LIST:  1. 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  IgG level on 08/17/2012 was 4760.  Kappa light chains were 21.90.  Beta 2 microglobulin 1.99. Albumin 3.4.  Total protein 10.1 and globulin 6.7.  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  Stage by ISS for multiple myeloma is stage II.  Metastatic bone survey carried out on 08/28/2012 was negative for evidence of multiple myeloma.  A 24-hour urine protein collected on 09/16/2012 was 27 mg. Urine immunofixation electrophoresis showed IgG heavy chains with associated kappa light chains and excess monoclonal free kappa light Chains. Treatment with Velcade, Cytoxan and Decadron was started on 09/24/2012.  It will be recalled that this patient was diagnosed with indolent/smoldering multiple myeloma, IgG kappa, when a bone marrow carried out on 10/17/2009 showed 11% plasma cells.  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  IgG level on 11/30/2009 was 2870.   2. Leukopenia and neutropenia.  3. Anemia.  4. Elevated PSA--14.7 on 07/20/2012, whereas PSA on 06/28/2009 had  been 4.25. Patient to have prostate biopsy by Dr. Isabel Caprice.  5. Hypertension.  6. Hemangiomas of the liver seen on ultrasound carried out on  07/31/2009.  7. Lower back pain with radiation into the right leg.  Degenerative disk disease and spinal stenosis seen on an MRI of the  lumbar spine with and without IV contrast on 08/28/2012. There was no  evidence for myeloma or cancer. No pathologic fracture.  8. Status post laparoscopic cholecystectomy carried out in 2002.    MEDICATIONS:  Reviewed and recorded. No current facility-administered medications for this visit.   Current Outpatient Prescriptions  Medication Sig Dispense Refill  .  acyclovir (ZOVIRAX) 400 MG tablet Take 400 mg by mouth 2 (two) times daily.      [name] losartan (COZAAR) 100 MG tablet Take 100 mg by mouth daily.      . metoCLOPramide (REGLAN) 10 MG tablet Take 1 tablet (10 mg total) by mouth every 6 (six) hours as needed (nausea/headache).  15 tablet  0  . ondansetron (ZOFRAN) 8 MG tablet Take 8 mg by mouth every 8 (eight) hours as needed for nausea.      [name] oxyCODONE-acetaminophen (PERCOCET/ROXICET) 5-325 MG per tablet Take 2 tablets by mouth every 4 (four) hours as needed for pain.      [name] PRESCRIPTION MEDICATION Inject 1 application into the vein every 7 (seven) days. Pt gets chemo tx from [facility]. Pt gets this every 7 days. Pt will cont to get this treatment every Thursday       Facility-Administered Medications Ordered in Other Visits  Medication Dose Route Frequency Provider Last Rate Last Dose  . sodium chloride 0.9 % bolus 1,000 mL  1,000 mL Intravenous Once Gwyneth Sprout, MD 1,000 mL/hr at 10/23/12 1353 1,000 mL at 10/23/12 1353   TREATMENT PROGRAM:  1. Velcade 1.3 mg/m2, equal to 2.5 mg subcu weekly.  2. Cytoxan 300 mg per meter squared equal to 500 mg IV weekly.  3. Decadron 40 mg IV weekly.  Chemotherapy was started on 09/24/2012.    IMMUNIZATIONS:  Pneumovax was given on 10/22/2012. Flu shot was given in December 2013.    SMOKING HISTORY: The patient smoked up to 2 packs of cigarettes a day  for  5 years. He has not smoked since his mid 58s.    HISTORY:  I am seeing Travis Nguyen today for followup of his IgG kappa multiple myeloma.  Treatments with subcutaneous Velcade, IV Cytoxan and IV Decadron were started on 09/24/2012.  The patient has been receiving weekly treatments.  On treatment #2 we held IV Cytoxan because of a drop in the white count and ANC.  Blood counts have continued to be fairly stable, although with persistent leukopenia, neutropenia and anemia.  Cesar has tolerated his treatments  extremely well.  To date he has received 4 treatments.  His most recent treatment was a week ago on April 3rd, that being treatment #4.  He is due today for treatment #5.  Grayling had been last seen by me on 10/08/2012.  I was supposed to be seeing him on 11/05/2012, but for some reason or other this appointment may have been on the books.  Nimai is here today with his wife, Patience Musca.  Carlitos is on disability.  He asked me for a note which I gave him.  He is really without complaints today.  As stated, he is tolerating his treatments well.  Lab data shows some early signs of response.  He denies any pain.  Energy appears to be good.  PHYSICAL EXAMINATION:  General:  Kenzo looks well.  Vital signs:  He has gained a few pounds, now 157 pounds 6.4 ounces.  Height 6 feet 1-1/2 inches.  Body surface area 1.92 m2.  Blood pressure 131/72.  Other vital signs are normal.  The rest of his physical exam is really unchanged from his most recent exam on 10/08/2012.  LABORATORY DATA:  Today, white count 2.6, ANC 1.5, hemoglobin 10.2, hematocrit 30.3, platelets 183,000.  Chemistries, IgG level, and serum light chains are pending today.  On 10/08/2012 chemistries were essentially normal except for a sodium of 132.  BUN was 18, creatinine 0.9, albumin 3.5, total protein 9.5 and therefore globulins were 6.0. IgG level was 4550 down from 5060 on 03/13.  The kappa free light chains were 6.25, down from 11.10 on 09/24/2012.  IMAGING STUDIES:  1. Abdominal ultrasound carried out on 07/31/2009 showed  a hyperechoic lesion in the right lobe of the liver which basically had  not changed compared with the study carried out on 08/09/2006, felt to  be most consistent with a hemangioma. The patient was status post  cholecystectomy. Spleen was normal.  2. Metastatic bone survey on 08/08/2009 showed no lytic bone lesions.  3. Lumbar spine complete 4 views from 10/14/2011 showed L5-S1 and to a  lesser extent  L4-L5 degenerative disk space narrowing. Otherwise, a  negative study.  4. Sacrum and coccyx 2 views showed moderate bilateral hip joint  degenerative changes. There were femoral head subchondral cystic  changes and sclerosis, greater on the right. There were prominent  degenerative changes at L5-S1.  5. Chest x-ray 2 view from 08/03/2012 showed no active cardiopulmonary  Disease.  6. Metastatic bone survey from 08/28/2012 was negative for evidence of  multiple myeloma or any destructive lesions.  7. MRI of the lumbar spine with and without IV contrast from 08/28/2012  showed no evidence for multiple myeloma. There were no pathologic  fractures. There was heterogeneous marrow signal. There was  degenerative disk disease at L4-L5 and L5-S1. There was mild L4-L5  central stenosis and bilateral lateral recess stenosis which potentially  affects the descending L5 nerves. The L5-S1 left lateral recess  encroachment potentially affects the  descending left S1 nerve.  8. Nuclear medicine whole body bone scan from 08/28/2012 showed activity  within multiple joints, most likely arthritic or degenerative. There  was no pattern to suggest skeletal metastatic disease.   IMPRESSION AND PLAN:  Thorvald seems to be doing well from the standpoint of his disease, which is asymptomatic, and his treatments.  He is due for treatment #5 today.  He will receive Velcade 2.5 mg subcu, Cytoxan 500 mg IV and Decadron 40 mg IV.  We will continue with weekly CBC and chemotherapy.  Treatments have been scheduled for April 17th and April 24th as well as May 1st, May 8th and May 15th.  We will plan to see Cordarro again on May 15th at which time we will check CBC, chemistries, IgG level, and serum light chains.  If Augustin continues to show a favorable response, at some point he might be a candidate for referral to one of our tertiary centers for consideration of high-dose chemotherapy and autologous stem cell  rescue.  In looking over Goku's chart he apparently has not had Pneumovax.  He will receive Pneumovax today.  Decker will also be undergoing a prostate biopsy by Dr. Barron Alvine on April 24th.  The appointment that Kristian originally had to see me on April 24th is being canceled and rescheduled for May 15th.    ______________________________ Samul Dada, M.D. DSM/MEDQ  D:  10/22/2012  T:  10/23/2012  Job:  [id number]

## 2012-10-23 NOTE — ED Notes (Signed)
Pt reports having chemo yesterday, c/o left sided abd pain, n/v/d and diaphoresis around 10pm. Reports vomiting 2 times, constant diarrhea. Denies having reaction to chemo before, currently has had 3 treatments

## 2012-10-29 ENCOUNTER — Other Ambulatory Visit: Payer: Self-pay | Admitting: Medical Oncology

## 2012-10-29 ENCOUNTER — Other Ambulatory Visit (HOSPITAL_BASED_OUTPATIENT_CLINIC_OR_DEPARTMENT_OTHER): Payer: No Typology Code available for payment source | Admitting: Lab

## 2012-10-29 ENCOUNTER — Ambulatory Visit (HOSPITAL_BASED_OUTPATIENT_CLINIC_OR_DEPARTMENT_OTHER): Payer: No Typology Code available for payment source

## 2012-10-29 VITALS — BP 142/78 | HR 60 | Temp 97.8°F | Resp 18

## 2012-10-29 DIAGNOSIS — C9000 Multiple myeloma not having achieved remission: Secondary | ICD-10-CM

## 2012-10-29 DIAGNOSIS — Z5112 Encounter for antineoplastic immunotherapy: Secondary | ICD-10-CM

## 2012-10-29 LAB — CBC WITH DIFFERENTIAL/PLATELET
Basophils Absolute: 0 10*3/uL (ref 0.0–0.1)
EOS%: 0 % (ref 0.0–7.0)
Eosinophils Absolute: 0 10*3/uL (ref 0.0–0.5)
HCT: 29.4 % — ABNORMAL LOW (ref 38.4–49.9)
HGB: 9.8 g/dL — ABNORMAL LOW (ref 13.0–17.1)
MCH: 33 pg (ref 27.2–33.4)
MCV: 99 fL — ABNORMAL HIGH (ref 79.3–98.0)
NEUT#: 1.3 10*3/uL — ABNORMAL LOW (ref 1.5–6.5)
NEUT%: 48.8 % (ref 39.0–75.0)
RDW: 13.3 % (ref 11.0–14.6)
lymph#: 1.2 10*3/uL (ref 0.9–3.3)

## 2012-10-29 MED ORDER — SODIUM CHLORIDE 0.9 % IV SOLN
500.0000 mg | Freq: Once | INTRAVENOUS | Status: AC
  Administered 2012-10-29: 500 mg via INTRAVENOUS
  Filled 2012-10-29: qty 25

## 2012-10-29 MED ORDER — BORTEZOMIB CHEMO SQ INJECTION 3.5 MG (2.5MG/ML)
1.3000 mg/m2 | Freq: Once | INTRAMUSCULAR | Status: AC
  Administered 2012-10-29: 2.5 mg via SUBCUTANEOUS
  Filled 2012-10-29: qty 2.5

## 2012-10-29 MED ORDER — ONDANSETRON HCL 8 MG PO TABS
8.0000 mg | ORAL_TABLET | Freq: Once | ORAL | Status: AC
  Administered 2012-10-29: 8 mg via ORAL

## 2012-10-29 MED ORDER — SODIUM CHLORIDE 0.9 % IV SOLN
Freq: Once | INTRAVENOUS | Status: AC
  Administered 2012-10-29: 09:00:00 via INTRAVENOUS

## 2012-10-29 MED ORDER — DEXAMETHASONE SODIUM PHOSPHATE 4 MG/ML IJ SOLN
40.0000 mg | Freq: Once | INTRAMUSCULAR | Status: AC
  Administered 2012-10-29: 40 mg via INTRAVENOUS

## 2012-10-29 NOTE — Progress Notes (Signed)
Labs today WBC 2.7 and ANC 1.3. Per Dr. Arline Asp, OK to treat.

## 2012-10-29 NOTE — Patient Instructions (Addendum)
[facility] Discharge Instructions for Patients Receiving Chemotherapy  Today you received the following chemotherapy agents Velcade and Cytoxan.  To help prevent nausea and vomiting after your treatment, we encourage you to take your nausea medication as prescribed.    If you develop nausea and vomiting that is not controlled by your nausea medication, call the clinic. If it is after clinic hours your family physician or the after hours number for the clinic or go to the Emergency Department.   BELOW ARE SYMPTOMS THAT SHOULD BE REPORTED IMMEDIATELY:  *FEVER GREATER THAN 100.5 F  *CHILLS WITH OR WITHOUT FEVER  NAUSEA AND VOMITING THAT IS NOT CONTROLLED WITH YOUR NAUSEA MEDICATION  *UNUSUAL SHORTNESS OF BREATH  *UNUSUAL BRUISING OR BLEEDING  TENDERNESS IN MOUTH AND THROAT WITH OR WITHOUT PRESENCE OF ULCERS  *URINARY PROBLEMS  *BOWEL PROBLEMS  UNUSUAL RASH Items with * indicate a potential emergency and should be followed up as soon as possible.   Please let the nurse know about any problems that you may have experienced. Feel free to call the clinic you have any questions or concerns. The clinic phone number is [phone].   I have been informed and understand all the instructions given to me. I know to contact the clinic, my physician, or go to the Emergency Department if any problems should occur. I do not have any questions at this time, but understand that I may call the clinic during office hours   should I have any questions or need assistance in obtaining follow up care.    __________________________________________  _____________  __________ Signature of Patient or Authorized Representative            Date                   Time    __________________________________________ Nurse's Signature    

## 2012-10-30 ENCOUNTER — Other Ambulatory Visit: Payer: Self-pay | Admitting: Radiology

## 2012-10-30 ENCOUNTER — Other Ambulatory Visit: Payer: Self-pay | Admitting: Certified Registered Nurse Anesthetist

## 2012-10-30 ENCOUNTER — Telehealth: Payer: Self-pay | Admitting: Medical Oncology

## 2012-10-30 NOTE — Telephone Encounter (Signed)
I spoke with wife to let her know that Interventional Radiology can place his port on Monday 11/02/12. I asked her to call Inetta Fermo in IR to get instructions.I gave her the number to call Inetta Fermo. She states she will call right away.

## 2012-11-02 ENCOUNTER — Ambulatory Visit (HOSPITAL_COMMUNITY)
Admission: RE | Admit: 2012-11-02 | Discharge: 2012-11-02 | Disposition: A | Discharge status: Home / Self Care | Payer: No Typology Code available for payment source | Source: Ambulatory Visit | Attending: Oncology | Admitting: Oncology

## 2012-11-02 ENCOUNTER — Encounter (HOSPITAL_COMMUNITY): Payer: Self-pay | Admitting: Interventional Radiology

## 2012-11-02 ENCOUNTER — Other Ambulatory Visit: Payer: Self-pay | Admitting: Medical Oncology

## 2012-11-02 ENCOUNTER — Encounter (HOSPITAL_COMMUNITY): Payer: Self-pay

## 2012-11-02 ENCOUNTER — Encounter: Payer: Self-pay | Admitting: Medical Oncology

## 2012-11-02 DIAGNOSIS — C9000 Multiple myeloma not having achieved remission: Secondary | ICD-10-CM | POA: Insufficient documentation

## 2012-11-02 DIAGNOSIS — I10 Essential (primary) hypertension: Secondary | ICD-10-CM | POA: Insufficient documentation

## 2012-11-02 LAB — BASIC METABOLIC PANEL
CO2: 28 mEq/L (ref 19–32)
Calcium: 8.9 mg/dL (ref 8.4–10.5)
Chloride: 99 mEq/L (ref 96–112)
Glucose, Bld: 88 mg/dL (ref 70–99)
Potassium: 4 mEq/L (ref 3.5–5.1)
Sodium: 131 mEq/L — ABNORMAL LOW (ref 135–145)

## 2012-11-02 LAB — CBC
HCT: 27.5 % — ABNORMAL LOW (ref 39.0–52.0)
Hemoglobin: 9.6 g/dL — ABNORMAL LOW (ref 13.0–17.0)
MCH: 34 pg (ref 26.0–34.0)
MCV: 97.5 fL (ref 78.0–100.0)
Platelets: 196 10*3/uL (ref 150–400)
RBC: 2.82 MIL/uL — ABNORMAL LOW (ref 4.22–5.81)
WBC: 2.2 10*3/uL — ABNORMAL LOW (ref 4.0–10.5)

## 2012-11-02 LAB — APTT: aPTT: 25 seconds (ref 24–37)

## 2012-11-02 MED ORDER — CEFAZOLIN SODIUM 1-5 GM-% IV SOLN
1.0000 g | Freq: Once | INTRAVENOUS | Status: AC
  Administered 2012-11-02: 1 g via INTRAVENOUS
  Filled 2012-11-02: qty 50

## 2012-11-02 MED ORDER — MIDAZOLAM HCL 2 MG/2ML IJ SOLN
INTRAMUSCULAR | Status: AC
  Filled 2012-11-02: qty 6

## 2012-11-02 MED ORDER — LIDOCAINE-PRILOCAINE 2.5-2.5 % EX CREA: TOPICAL_CREAM | CUTANEOUS | Status: DC | PRN

## 2012-11-02 MED ORDER — FENTANYL CITRATE 0.05 MG/ML IJ SOLN
INTRAMUSCULAR | Status: AC
  Filled 2012-11-02: qty 6

## 2012-11-02 MED ORDER — LIDOCAINE HCL 1 % IJ SOLN
INTRAMUSCULAR | Status: AC
  Filled 2012-11-02: qty 20

## 2012-11-02 MED ORDER — MIDAZOLAM HCL 2 MG/2ML IJ SOLN
INTRAMUSCULAR | Status: AC | PRN
  Administered 2012-11-02 (×2): 1 mg via INTRAVENOUS
  Administered 2012-11-02: 0.5 mg via INTRAVENOUS

## 2012-11-02 MED ORDER — SODIUM CHLORIDE 0.9 % IV SOLN
Freq: Once | INTRAVENOUS | Status: AC
  Administered 2012-11-02: 12:00:00 via INTRAVENOUS

## 2012-11-02 MED ORDER — HEPARIN SOD (PORK) LOCK FLUSH 100 UNIT/ML IV SOLN
INTRAVENOUS | Status: AC | PRN
  Administered 2012-11-02: 500 [IU]

## 2012-11-02 MED ORDER — FENTANYL CITRATE 0.05 MG/ML IJ SOLN
INTRAMUSCULAR | Status: AC | PRN
  Administered 2012-11-02 (×2): 50 ug via INTRAVENOUS
  Administered 2012-11-02: 25 ug via INTRAVENOUS

## 2012-11-02 NOTE — Procedures (Signed)
RIJV PAC Tip SVC RA No comp 

## 2012-11-02 NOTE — H&P (Signed)
Travis Nguyen is an 62 y.o. male.   Chief Complaint: "I'm here to get a port a cath" HPI: Patient with history of multiple myeloma presents today for placement of a port a cath for chemotherapy.   Past Medical History  Diagnosis Date  . Cancer   . Hypertension     Past Surgical History  Procedure Laterality Date  . Cholecystectomy      Family History  Problem Relation Age of Onset  . Heart failure Mother   . Hypertension Mother   . Heart failure Brother   . Hypertension Brother    Social History:  reports that he has never smoked. He does not have any smokeless tobacco history on file. He reports that he does not drink alcohol or use illicit drugs.  Allergies: No Known Allergies  Current outpatient prescriptions:acyclovir (ZOVIRAX) 400 MG tablet, Take 400 mg by mouth 2 (two) times daily. , Disp: , Rfl: ;  losartan (COZAAR) 100 MG tablet, Take 100 mg by mouth every morning. , Disp: , Rfl: ;  oxyCODONE-acetaminophen (PERCOCET/ROXICET) 5-325 MG per tablet, Take 2 tablets by mouth every 4 (four) hours as needed for pain., Disp: , Rfl: ;  lidocaine-prilocaine (EMLA) cream, Apply topically as needed., Disp: , Rfl:  metoCLOPramide (REGLAN) 10 MG tablet, Take 1 tablet (10 mg total) by mouth every 6 (six) hours as needed (nausea/headache)., Disp: 15 tablet, Rfl: 0;  ondansetron (ZOFRAN) 8 MG tablet, Take 8 mg by mouth every 8 (eight) hours as needed for nausea., Disp: , Rfl: ;  PRESCRIPTION MEDICATION, 1 Units by Other route every 7 (seven) days. Chemo (Velcade SQ and Cytoxan IV) once a week on Thursday, Disp: , Rfl:  Current facility-administered medications:ceFAZolin (ANCEF) IVPB 1 g/50 mL premix, 1 g, Intravenous, Once, Robet Leu, PA-C   Results for orders placed during the hospital encounter of 11/02/12 (from the past 48 hour(s))  APTT     Status: None   Collection Time    11/02/12 12:15 PM      Result Value Range   aPTT 25  24 - 37 seconds  BASIC METABOLIC PANEL      Status: Abnormal   Collection Time    11/02/12 12:15 PM      Result Value Range   Sodium 131 (*) 135 - 145 mEq/L   Potassium 4.0  3.5 - 5.1 mEq/L   Chloride 99  96 - 112 mEq/L   CO2 28  19 - 32 mEq/L   Glucose, Bld 88  70 - 99 mg/dL   BUN 9  6 - 23 mg/dL   Creatinine, Ser 4.09  0.50 - 1.35 mg/dL   Calcium 8.9  8.4 - 81.1 mg/dL   GFR calc non Af Amer >90  >90 mL/min   GFR calc Af Amer >90  >90 mL/min   Comment:            The eGFR has been calculated     using the CKD EPI equation.     This calculation has not been     validated in all clinical     situations.     eGFR's persistently     <90 mL/min signify     possible Chronic Kidney Disease.  CBC     Status: Abnormal   Collection Time    11/02/12 12:15 PM      Result Value Range   WBC 2.2 (*) 4.0 - 10.5 K/uL   RBC 2.82 (*) 4.22 - 5.81 MIL/uL  Hemoglobin 9.6 (*) 13.0 - 17.0 g/dL   HCT 13.0 (*) 86.5 - 78.4 %   MCV 97.5  78.0 - 100.0 fL   MCH 34.0  26.0 - 34.0 pg   MCHC 34.9  30.0 - 36.0 g/dL   RDW 69.6  29.5 - 28.4 %   Platelets 196  150 - 400 K/uL  PROTIME-INR     Status: None   Collection Time    11/02/12 12:15 PM      Result Value Range   Prothrombin Time 12.3  11.6 - 15.2 seconds   INR 0.92  0.00 - 1.49   No results found.  Review of Systems  Constitutional: Negative for fever and chills.  Respiratory: Negative for cough and shortness of breath.   Cardiovascular: Negative for chest pain.  Gastrointestinal: Negative for nausea, vomiting and abdominal pain.  Musculoskeletal: Negative for back pain.  Neurological: Negative for headaches.  Endo/Heme/Allergies: Does not bruise/bleed easily.    Blood pressure 121/81, pulse 65, temperature 98.1 F (36.7 C), temperature source Oral, resp. rate 16, SpO2 100.00%. Physical Exam  Constitutional: He is oriented to person, place, and time. He appears well-developed and well-nourished.  Cardiovascular: Normal rate and regular rhythm.   Respiratory: Effort normal and  breath sounds normal.  GI: Soft. Bowel sounds are normal. There is no tenderness.  Musculoskeletal: Normal range of motion. He exhibits no edema.  Neurological: He is alert and oriented to person, place, and time.     Assessment/Plan Pt with hx multiple myeloma. Plan is for port a cath placement today for chemotherapy. Details/risks of procedure d/w pt with his understanding and consent.  Audrie Kuri,D KEVIN 11/02/2012, 1:17 PM

## 2012-11-03 ENCOUNTER — Other Ambulatory Visit: Payer: Self-pay | Admitting: Medical Oncology

## 2012-11-03 MED ORDER — LIDOCAINE-PRILOCAINE 2.5-2.5 % EX CREA: TOPICAL_CREAM | CUTANEOUS | Status: DC | PRN

## 2012-11-03 NOTE — Telephone Encounter (Signed)
I called pt and left a message to let [facility] emla cream. I asked her to call me back and let me know where she would like this called in to.

## 2012-11-03 NOTE — Telephone Encounter (Signed)
Sherelene-wife called back and asked if I would send the order to [facility]. I told her if it too expensive we can get him some to put on when he comes in for his labs. She voiced understanding.

## 2012-11-05 ENCOUNTER — Ambulatory Visit (HOSPITAL_BASED_OUTPATIENT_CLINIC_OR_DEPARTMENT_OTHER): Payer: No Typology Code available for payment source

## 2012-11-05 ENCOUNTER — Other Ambulatory Visit (HOSPITAL_BASED_OUTPATIENT_CLINIC_OR_DEPARTMENT_OTHER): Payer: No Typology Code available for payment source | Admitting: Lab

## 2012-11-05 ENCOUNTER — Ambulatory Visit: Payer: No Typology Code available for payment source | Admitting: Oncology

## 2012-11-05 VITALS — BP 113/75 | HR 75 | Temp 97.3°F

## 2012-11-05 DIAGNOSIS — Z5111 Encounter for antineoplastic chemotherapy: Secondary | ICD-10-CM

## 2012-11-05 DIAGNOSIS — Z5112 Encounter for antineoplastic immunotherapy: Secondary | ICD-10-CM

## 2012-11-05 DIAGNOSIS — C9000 Multiple myeloma not having achieved remission: Secondary | ICD-10-CM

## 2012-11-05 LAB — CBC WITH DIFFERENTIAL/PLATELET
Basophils Absolute: 0 10*3/uL (ref 0.0–0.1)
Eosinophils Absolute: 0 10*3/uL (ref 0.0–0.5)
HCT: 34.5 % — ABNORMAL LOW (ref 38.4–49.9)
HGB: 11.6 g/dL — ABNORMAL LOW (ref 13.0–17.1)
MCH: 33.4 pg (ref 27.2–33.4)
NEUT#: 1.6 10*3/uL (ref 1.5–6.5)
NEUT%: 62.1 % (ref 39.0–75.0)
RDW: 13.6 % (ref 11.0–14.6)
lymph#: 0.9 10*3/uL (ref 0.9–3.3)

## 2012-11-05 MED ORDER — HEPARIN SOD (PORK) LOCK FLUSH 100 UNIT/ML IV SOLN
500.0000 [IU] | Freq: Once | INTRAVENOUS | Status: AC
  Administered 2012-11-05: 500 [IU] via INTRAVENOUS
  Filled 2012-11-05: qty 5

## 2012-11-05 MED ORDER — SODIUM CHLORIDE 0.9 % IV SOLN
500.0000 mg | Freq: Once | INTRAVENOUS | Status: AC
  Administered 2012-11-05: 500 mg via INTRAVENOUS
  Filled 2012-11-05: qty 25

## 2012-11-05 MED ORDER — BORTEZOMIB CHEMO SQ INJECTION 3.5 MG (2.5MG/ML)
1.3000 mg/m2 | Freq: Once | INTRAMUSCULAR | Status: AC
  Administered 2012-11-05: 2.5 mg via SUBCUTANEOUS
  Filled 2012-11-05: qty 2.5

## 2012-11-05 MED ORDER — ONDANSETRON HCL 8 MG PO TABS
8.0000 mg | ORAL_TABLET | Freq: Once | ORAL | Status: AC
  Administered 2012-11-05: 8 mg via ORAL

## 2012-11-05 MED ORDER — SODIUM CHLORIDE 0.9 % IV SOLN
Freq: Once | INTRAVENOUS | Status: AC
Start: 2012-11-05 — End: 2012-11-05
  Administered 2012-11-05: 09:00:00 via INTRAVENOUS

## 2012-11-05 MED ORDER — SODIUM CHLORIDE 0.9 % IJ SOLN
10.0000 mL | INTRAMUSCULAR | Status: DC | PRN
  Administered 2012-11-05: 10 mL via INTRAVENOUS
  Filled 2012-11-05: qty 10

## 2012-11-05 MED ORDER — DEXAMETHASONE SODIUM PHOSPHATE 4 MG/ML IJ SOLN
40.0000 mg | Freq: Once | INTRAMUSCULAR | Status: AC
  Administered 2012-11-05: 40 mg via INTRAVENOUS

## 2012-11-05 NOTE — Patient Instructions (Signed)
[facility] Discharge Instructions for Patients Receiving Chemotherapy  Today you received the following chemotherapy agents Cytoxan/Velcade To help prevent nausea and vomiting after your treatment, we encourage you to take your nausea medication as prescribed.   If you develop nausea and vomiting that is not controlled by your nausea medication, call the clinic. If it is after clinic hours your family physician or the after hours number for the clinic or go to the Emergency Department.   BELOW ARE SYMPTOMS THAT SHOULD BE REPORTED IMMEDIATELY:  *FEVER GREATER THAN 100.5 F  *CHILLS WITH OR WITHOUT FEVER  NAUSEA AND VOMITING THAT IS NOT CONTROLLED WITH YOUR NAUSEA MEDICATION  *UNUSUAL SHORTNESS OF BREATH  *UNUSUAL BRUISING OR BLEEDING  TENDERNESS IN MOUTH AND THROAT WITH OR WITHOUT PRESENCE OF ULCERS  *URINARY PROBLEMS  *BOWEL PROBLEMS  UNUSUAL RASH Items with * indicate a potential emergency and should be followed up as soon as possible.  One of the nurses will contact you 24 hours after your treatment. Please let the nurse know about any problems that you may have experienced. Feel free to call the clinic you have any questions or concerns. The clinic phone number is [phone].   I have been informed and understand all the instructions given to me. I know to contact the clinic, my physician, or go to the Emergency Department if any problems should occur. I do not have any questions at this time, but understand that I may call the clinic during office hours   should I have any questions or need assistance in obtaining follow up care.    __________________________________________  _____________  __________ Signature of Patient or Authorized Representative            Date                   Time    __________________________________________ Nurse's Signature    

## 2012-11-06 ENCOUNTER — Encounter: Payer: Self-pay | Admitting: Oncology

## 2012-11-06 NOTE — Progress Notes (Signed)
The patient underwent prostate biopsies (12) on 11/05/2012.

## 2012-11-12 ENCOUNTER — Encounter: Payer: Self-pay | Admitting: Oncology

## 2012-11-12 ENCOUNTER — Ambulatory Visit (HOSPITAL_BASED_OUTPATIENT_CLINIC_OR_DEPARTMENT_OTHER): Payer: No Typology Code available for payment source

## 2012-11-12 ENCOUNTER — Other Ambulatory Visit (HOSPITAL_BASED_OUTPATIENT_CLINIC_OR_DEPARTMENT_OTHER): Payer: No Typology Code available for payment source | Admitting: Lab

## 2012-11-12 VITALS — BP 124/73 | HR 63 | Temp 97.6°F | Resp 18

## 2012-11-12 DIAGNOSIS — C61 Malignant neoplasm of prostate: Secondary | ICD-10-CM

## 2012-11-12 DIAGNOSIS — Z5111 Encounter for antineoplastic chemotherapy: Secondary | ICD-10-CM

## 2012-11-12 DIAGNOSIS — Z5112 Encounter for antineoplastic immunotherapy: Secondary | ICD-10-CM

## 2012-11-12 DIAGNOSIS — C9000 Multiple myeloma not having achieved remission: Secondary | ICD-10-CM

## 2012-11-12 HISTORY — DX: Malignant neoplasm of prostate: C61

## 2012-11-12 LAB — CBC WITH DIFFERENTIAL/PLATELET
Basophils Absolute: 0 10*3/uL (ref 0.0–0.1)
Eosinophils Absolute: 0 10*3/uL (ref 0.0–0.5)
HCT: 29.2 % — ABNORMAL LOW (ref 38.4–49.9)
HGB: 9.8 g/dL — ABNORMAL LOW (ref 13.0–17.1)
LYMPH%: 28.7 % (ref 14.0–49.0)
MCV: 98.6 fL — ABNORMAL HIGH (ref 79.3–98.0)
MONO#: 0.1 10*3/uL (ref 0.1–0.9)
MONO%: 4.6 % (ref 0.0–14.0)
NEUT#: 1.9 10*3/uL (ref 1.5–6.5)
NEUT%: 65.6 % (ref 39.0–75.0)
Platelets: 184 10*3/uL (ref 140–400)
RBC: 2.96 10*6/uL — ABNORMAL LOW (ref 4.20–5.82)
WBC: 2.8 10*3/uL — ABNORMAL LOW (ref 4.0–10.3)

## 2012-11-12 MED ORDER — HEPARIN SOD (PORK) LOCK FLUSH 100 UNIT/ML IV SOLN
500.0000 [IU] | Freq: Once | INTRAVENOUS | Status: AC
  Administered 2012-11-12: 500 [IU] via INTRAVENOUS
  Filled 2012-11-12: qty 5

## 2012-11-12 MED ORDER — BORTEZOMIB CHEMO SQ INJECTION 3.5 MG (2.5MG/ML)
1.3000 mg/m2 | Freq: Once | INTRAMUSCULAR | Status: AC
  Administered 2012-11-12: 2.5 mg via SUBCUTANEOUS
  Filled 2012-11-12: qty 1

## 2012-11-12 MED ORDER — SODIUM CHLORIDE 0.9 % IV SOLN
Freq: Once | INTRAVENOUS | Status: AC
  Administered 2012-11-12: 09:00:00 via INTRAVENOUS

## 2012-11-12 MED ORDER — SODIUM CHLORIDE 0.9 % IJ SOLN
10.0000 mL | INTRAMUSCULAR | Status: DC | PRN
  Administered 2012-11-12: 10 mL via INTRAVENOUS
  Filled 2012-11-12: qty 10

## 2012-11-12 MED ORDER — DEXAMETHASONE SODIUM PHOSPHATE 20 MG/5ML IJ SOLN
40.0000 mg | Freq: Once | INTRAMUSCULAR | Status: AC
  Administered 2012-11-12: 40 mg via INTRAVENOUS
  Filled 2012-11-12: qty 10

## 2012-11-12 MED ORDER — ONDANSETRON HCL 8 MG PO TABS
8.0000 mg | ORAL_TABLET | Freq: Once | ORAL | Status: AC
  Administered 2012-11-12: 8 mg via ORAL

## 2012-11-12 MED ORDER — SODIUM CHLORIDE 0.9 % IV SOLN
500.0000 mg | Freq: Once | INTRAVENOUS | Status: AC
  Administered 2012-11-12: 500 mg via INTRAVENOUS
  Filled 2012-11-12: qty 25

## 2012-11-12 NOTE — Progress Notes (Signed)
The patient's wife came in office. She nor hubby(patient) are working. They have no insurance. They have applied for SSI about 2 months ago. I advised her to make sure she calls and checks the status for them this week. Her son is source of income for household. I gave pen and paper for him to write letter he supports mom and dad. They have not applied for any insurance or medicaid just SSI. I gave EPP to get back to [facility].

## 2012-11-12 NOTE — Progress Notes (Signed)
Dr. Arline Asp reviewed lab results today.  OK to proceed with chemo as per md.

## 2012-11-12 NOTE — Patient Instructions (Addendum)
[facility] Discharge Instructions for Patients Receiving Chemotherapy  Today you received the following chemotherapy agents :  Velcade,  Cytoxan.  To help prevent nausea and vomiting after your treatment, we encourage you to take your nausea medication as instructed by your physician.    If you develop nausea and vomiting that is not controlled by your nausea medication, call the clinic. If it is after clinic hours your family physician or the after hours number for the clinic or go to the Emergency Department.   BELOW ARE SYMPTOMS THAT SHOULD BE REPORTED IMMEDIATELY:  *FEVER GREATER THAN 100.5 F  *CHILLS WITH OR WITHOUT FEVER  NAUSEA AND VOMITING THAT IS NOT CONTROLLED WITH YOUR NAUSEA MEDICATION  *UNUSUAL SHORTNESS OF BREATH  *UNUSUAL BRUISING OR BLEEDING  TENDERNESS IN MOUTH AND THROAT WITH OR WITHOUT PRESENCE OF ULCERS  *URINARY PROBLEMS  *BOWEL PROBLEMS  UNUSUAL RASH Items with * indicate a potential emergency and should be followed up as soon as possible.  One of the nurses will contact you 24 hours after your treatment. Please let the nurse know about any problems that you may have experienced. Feel free to call the clinic you have any questions or concerns. The clinic phone number is [phone].   I have been informed and understand all the instructions given to me. I know to contact the clinic, my physician, or go to the Emergency Department if any problems should occur. I do not have any questions at this time, but understand that I may call the clinic during office hours   should I have any questions or need assistance in obtaining follow up care.    __________________________________________  _____________  __________ Signature of Patient or Authorized Representative            Date                   Time    __________________________________________ Nurse's Signature

## 2012-11-12 NOTE — Progress Notes (Signed)
Called DSS to check status of Medicaid application.  Rep stated they sent pt a letter on 10/01/12 requesting addl't info.  I informed pt of everything they are requesting and gave him the number to call them to follow up.

## 2012-11-13 ENCOUNTER — Encounter: Payer: Self-pay | Admitting: Oncology

## 2012-11-13 NOTE — Progress Notes (Signed)
I received an office progress note from Dr. Barron Alvine, dated 11/12/2012. The patient underwent 12 core needle biopsies of the prostate on 11/05/2012. Apparently all cores showed Gleason's 3+3= 6 disease. Core involvement was less than 5-40%.   The patient is felt to have low risk/favorable clinical stage TIc. prostate cancer. PSA however was 16.7 and thus there was concern that the PSA was elevated out of proportion to the pathologic findings.  The patient does not appear to have a lot of obstructive symptoms but does have problematic urgency, frequency and nocturia.  Plan is to discuss the patient's prognosis with me. The patient will return in 2-3 weeks for further decision making.   At some point, the patient will be a candidate for either radiation or robotic prostatectomy.

## 2012-11-16 ENCOUNTER — Encounter: Payer: Self-pay | Admitting: Oncology

## 2012-11-16 NOTE — Progress Notes (Signed)
Pt is approved for 100% financial assistance effective 11/16/12 - 05/19/13.  I will mail approval letter and green card today.

## 2012-11-19 ENCOUNTER — Other Ambulatory Visit (HOSPITAL_BASED_OUTPATIENT_CLINIC_OR_DEPARTMENT_OTHER): Payer: No Typology Code available for payment source

## 2012-11-19 ENCOUNTER — Ambulatory Visit (HOSPITAL_BASED_OUTPATIENT_CLINIC_OR_DEPARTMENT_OTHER): Payer: No Typology Code available for payment source

## 2012-11-19 VITALS — BP 129/80 | HR 67 | Temp 97.6°F

## 2012-11-19 DIAGNOSIS — C9000 Multiple myeloma not having achieved remission: Secondary | ICD-10-CM

## 2012-11-19 DIAGNOSIS — Z5111 Encounter for antineoplastic chemotherapy: Secondary | ICD-10-CM

## 2012-11-19 DIAGNOSIS — Z5112 Encounter for antineoplastic immunotherapy: Secondary | ICD-10-CM

## 2012-11-19 LAB — CBC WITH DIFFERENTIAL/PLATELET
BASO%: 0.7 % (ref 0.0–2.0)
HCT: 29.9 % — ABNORMAL LOW (ref 38.4–49.9)
LYMPH%: 28.9 % (ref 14.0–49.0)
MCHC: 33.1 g/dL (ref 32.0–36.0)
MCV: 99.7 fL — ABNORMAL HIGH (ref 79.3–98.0)
MONO#: 0.1 10*3/uL (ref 0.1–0.9)
MONO%: 5 % (ref 0.0–14.0)
NEUT%: 65 % (ref 39.0–75.0)
Platelets: 193 10*3/uL (ref 140–400)
WBC: 2.8 10*3/uL — ABNORMAL LOW (ref 4.0–10.3)

## 2012-11-19 MED ORDER — HEPARIN SOD (PORK) LOCK FLUSH 100 UNIT/ML IV SOLN
500.0000 [IU] | Freq: Once | INTRAVENOUS | Status: AC
  Administered 2012-11-19: 500 [IU] via INTRAVENOUS
  Filled 2012-11-19: qty 5

## 2012-11-19 MED ORDER — SODIUM CHLORIDE 0.9 % IJ SOLN
10.0000 mL | INTRAMUSCULAR | Status: DC | PRN
  Administered 2012-11-19: 10 mL via INTRAVENOUS
  Filled 2012-11-19: qty 10

## 2012-11-19 MED ORDER — SODIUM CHLORIDE 0.9 % IV SOLN
INTRAVENOUS | Status: DC
  Administered 2012-11-19: 10:00:00 via INTRAVENOUS

## 2012-11-19 MED ORDER — SODIUM CHLORIDE 0.9 % IV SOLN
500.0000 mg | Freq: Once | INTRAVENOUS | Status: AC
  Administered 2012-11-19: 500 mg via INTRAVENOUS
  Filled 2012-11-19: qty 25

## 2012-11-19 MED ORDER — DEXAMETHASONE SODIUM PHOSPHATE 20 MG/5ML IJ SOLN
40.0000 mg | Freq: Once | INTRAMUSCULAR | Status: AC
  Administered 2012-11-19: 40 mg via INTRAVENOUS

## 2012-11-19 MED ORDER — ONDANSETRON HCL 8 MG PO TABS
8.0000 mg | ORAL_TABLET | Freq: Once | ORAL | Status: AC
  Administered 2012-11-19: 8 mg via ORAL

## 2012-11-19 MED ORDER — BORTEZOMIB CHEMO SQ INJECTION 3.5 MG (2.5MG/ML)
1.3000 mg/m2 | Freq: Once | INTRAMUSCULAR | Status: AC
  Administered 2012-11-19: 2.5 mg via SUBCUTANEOUS
  Filled 2012-11-19: qty 2.5

## 2012-11-19 NOTE — Patient Instructions (Signed)
[facility] Discharge Instructions for Patients Receiving Chemotherapy  Today you received the following chemotherapy agents: velcade, cytoxan  To help prevent nausea and vomiting after your treatment, we encourage you to take your nausea medication.  Take it as often as prescribed.     If you develop nausea and vomiting that is not controlled by your nausea medication, call the clinic. If it is after clinic hours your family physician or the after hours number for the clinic or go to the Emergency Department.   BELOW ARE SYMPTOMS THAT SHOULD BE REPORTED IMMEDIATELY:  *FEVER GREATER THAN 100.5 F  *CHILLS WITH OR WITHOUT FEVER  NAUSEA AND VOMITING THAT IS NOT CONTROLLED WITH YOUR NAUSEA MEDICATION  *UNUSUAL SHORTNESS OF BREATH  *UNUSUAL BRUISING OR BLEEDING  TENDERNESS IN MOUTH AND THROAT WITH OR WITHOUT PRESENCE OF ULCERS  *URINARY PROBLEMS  *BOWEL PROBLEMS  UNUSUAL RASH Items with * indicate a potential emergency and should be followed up as soon as possible.  One of the nurses will contact you 24 hours after your treatment. Please let the nurse know about any problems that you may have experienced. Feel free to call the clinic you have any questions or concerns. The clinic phone number is [phone].   I have been informed and understand all the instructions given to me. I know to contact the clinic, my physician, or go to the Emergency Department if any problems should occur. I do not have any questions at this time, but understand that I may call the clinic during office hours   should I have any questions or need assistance in obtaining follow up care.    __________________________________________  _____________  __________ Signature of Patient or Authorized Representative            Date                   Time    __________________________________________ Nurse's Signature   

## 2012-11-19 NOTE — Progress Notes (Signed)
Ok to treat with WBC 2.8 per Dr. Arline Asp

## 2012-11-20 NOTE — Progress Notes (Signed)
Pt and wife call [phone] to inquire about a follow up appointment for neurosurgery.  CM referred pt back to his pcp or the office of the staff member who called the wife on 11/19/12 to inform her of an appointment scheduled for 11/27/12 Informed pt he has an incorrect return number [phone])

## 2012-11-26 ENCOUNTER — Encounter: Payer: Self-pay | Admitting: Oncology

## 2012-11-26 ENCOUNTER — Ambulatory Visit (HOSPITAL_BASED_OUTPATIENT_CLINIC_OR_DEPARTMENT_OTHER): Payer: No Typology Code available for payment source | Admitting: Oncology

## 2012-11-26 ENCOUNTER — Other Ambulatory Visit (HOSPITAL_BASED_OUTPATIENT_CLINIC_OR_DEPARTMENT_OTHER): Payer: No Typology Code available for payment source | Admitting: Lab

## 2012-11-26 VITALS — BP 134/76 | HR 80 | Temp 98.6°F | Resp 18 | Ht 73.5 in | Wt 158.5 lb

## 2012-11-26 DIAGNOSIS — C9000 Multiple myeloma not having achieved remission: Secondary | ICD-10-CM

## 2012-11-26 LAB — CBC WITH DIFFERENTIAL/PLATELET
Eosinophils Absolute: 0 10*3/uL (ref 0.0–0.5)
MONO#: 0.1 10*3/uL (ref 0.1–0.9)
NEUT#: 1.8 10*3/uL (ref 1.5–6.5)
Platelets: 173 10*3/uL (ref 140–400)
RBC: 3.09 10*6/uL — ABNORMAL LOW (ref 4.20–5.82)
RDW: 14.1 % (ref 11.0–14.6)
WBC: 2.8 10*3/uL — ABNORMAL LOW (ref 4.0–10.3)
lymph#: 0.9 10*3/uL (ref 0.9–3.3)
nRBC: 0 % (ref 0–0)

## 2012-11-26 MED ORDER — CITALOPRAM HYDROBROMIDE 20 MG PO TABS: 20.0000 mg | ORAL_TABLET | Freq: Every day | ORAL | Status: DC

## 2012-11-26 MED ORDER — LIDOCAINE-PRILOCAINE 2.5-2.5 % EX CREA: TOPICAL_CREAM | CUTANEOUS | Status: DC | PRN

## 2012-11-26 NOTE — Addendum Note (Signed)
Addended by: Orbie Hurst on: 11/26/2012 04:41 PM   Modules accepted: Orders

## 2012-11-26 NOTE — Progress Notes (Signed)
This office note has been dictated.  #[id number]

## 2012-11-27 ENCOUNTER — Telehealth: Payer: Self-pay | Admitting: *Deleted

## 2012-11-27 ENCOUNTER — Ambulatory Visit (HOSPITAL_BASED_OUTPATIENT_CLINIC_OR_DEPARTMENT_OTHER): Payer: No Typology Code available for payment source

## 2012-11-27 ENCOUNTER — Other Ambulatory Visit (HOSPITAL_BASED_OUTPATIENT_CLINIC_OR_DEPARTMENT_OTHER): Payer: No Typology Code available for payment source

## 2012-11-27 VITALS — BP 128/76 | HR 83 | Temp 98.4°F | Resp 18

## 2012-11-27 DIAGNOSIS — C9000 Multiple myeloma not having achieved remission: Secondary | ICD-10-CM

## 2012-11-27 DIAGNOSIS — Z5112 Encounter for antineoplastic immunotherapy: Secondary | ICD-10-CM

## 2012-11-27 DIAGNOSIS — Z5111 Encounter for antineoplastic chemotherapy: Secondary | ICD-10-CM

## 2012-11-27 LAB — CBC WITH DIFFERENTIAL/PLATELET
BASO%: 0.4 % (ref 0.0–2.0)
Eosinophils Absolute: 0 10*3/uL (ref 0.0–0.5)
HCT: 30.2 % — ABNORMAL LOW (ref 38.4–49.9)
MCHC: 33.1 g/dL (ref 32.0–36.0)
MONO#: 0.1 10*3/uL (ref 0.1–0.9)
NEUT#: 1.7 10*3/uL (ref 1.5–6.5)
NEUT%: 62 % (ref 39.0–75.0)
Platelets: 173 10*3/uL (ref 140–400)
WBC: 2.7 10*3/uL — ABNORMAL LOW (ref 4.0–10.3)
lymph#: 0.9 10*3/uL (ref 0.9–3.3)

## 2012-11-27 LAB — COMPREHENSIVE METABOLIC PANEL (CC13)
ALT: 133 U/L — ABNORMAL HIGH (ref 0–55)
Alkaline Phosphatase: 83 U/L (ref 40–150)
Sodium: 135 mEq/L — ABNORMAL LOW (ref 136–145)
Total Bilirubin: 0.22 mg/dL (ref 0.20–1.20)
Total Protein: 8.8 g/dL — ABNORMAL HIGH (ref 6.4–8.3)

## 2012-11-27 MED ORDER — DEXAMETHASONE SODIUM PHOSPHATE 20 MG/5ML IJ SOLN
40.0000 mg | Freq: Once | INTRAMUSCULAR | Status: AC
  Administered 2012-11-27: 40 mg via INTRAVENOUS

## 2012-11-27 MED ORDER — SODIUM CHLORIDE 0.9 % IJ SOLN
10.0000 mL | INTRAMUSCULAR | Status: DC | PRN
  Administered 2012-11-27: 10 mL via INTRAVENOUS
  Filled 2012-11-27: qty 10

## 2012-11-27 MED ORDER — SODIUM CHLORIDE 0.9 % IV SOLN
500.0000 mg | Freq: Once | INTRAVENOUS | Status: AC
  Administered 2012-11-27: 500 mg via INTRAVENOUS
  Filled 2012-11-27: qty 25

## 2012-11-27 MED ORDER — SODIUM CHLORIDE 0.9 % IV SOLN
Freq: Once | INTRAVENOUS | Status: AC
  Administered 2012-11-27: 09:00:00 via INTRAVENOUS

## 2012-11-27 MED ORDER — ONDANSETRON HCL 8 MG PO TABS
8.0000 mg | ORAL_TABLET | Freq: Once | ORAL | Status: AC
  Administered 2012-11-27: 8 mg via ORAL

## 2012-11-27 MED ORDER — HEPARIN SOD (PORK) LOCK FLUSH 100 UNIT/ML IV SOLN
500.0000 [IU] | Freq: Once | INTRAVENOUS | Status: AC
  Administered 2012-11-27: 500 [IU] via INTRAVENOUS
  Filled 2012-11-27: qty 5

## 2012-11-27 MED ORDER — BORTEZOMIB CHEMO SQ INJECTION 3.5 MG (2.5MG/ML)
1.3000 mg/m2 | Freq: Once | INTRAMUSCULAR | Status: AC
  Administered 2012-11-27: 2.5 mg via SUBCUTANEOUS
  Filled 2012-11-27: qty 2.5

## 2012-11-27 NOTE — Progress Notes (Signed)
Notified Dr. Arline Asp of pt's lab results today.  Proceed with chemo as per md.

## 2012-11-27 NOTE — Telephone Encounter (Signed)
Per staff message and POF I have scheduled appts.  JMW  

## 2012-11-27 NOTE — Patient Instructions (Signed)
[facility] Discharge Instructions for Patients Receiving Chemotherapy  Today you received the following chemotherapy agents :  Velcade ,  Cytoxan.  To help prevent nausea and vomiting after your treatment, we encourage you to take your nausea medication as instructed by your physician.    If you develop nausea and vomiting that is not controlled by your nausea medication, call the clinic. If it is after clinic hours your family physician or the after hours number for the clinic or go to the Emergency Department.   BELOW ARE SYMPTOMS THAT SHOULD BE REPORTED IMMEDIATELY:  *FEVER GREATER THAN 100.5 F  *CHILLS WITH OR WITHOUT FEVER  NAUSEA AND VOMITING THAT IS NOT CONTROLLED WITH YOUR NAUSEA MEDICATION  *UNUSUAL SHORTNESS OF BREATH  *UNUSUAL BRUISING OR BLEEDING  TENDERNESS IN MOUTH AND THROAT WITH OR WITHOUT PRESENCE OF ULCERS  *URINARY PROBLEMS  *BOWEL PROBLEMS  UNUSUAL RASH Items with * indicate a potential emergency and should be followed up as soon as possible.  One of the nurses will contact you 24 hours after your treatment. Please let the nurse know about any problems that you may have experienced. Feel free to call the clinic you have any questions or concerns. The clinic phone number is [phone].   I have been informed and understand all the instructions given to me. I know to contact the clinic, my physician, or go to the Emergency Department if any problems should occur. I do not have any questions at this time, but understand that I may call the clinic during office hours   should I have any questions or need assistance in obtaining follow up care.    __________________________________________  _____________  __________ Signature of Patient or Authorized Representative            Date                   Time    __________________________________________ Nurse's Signature    

## 2012-11-27 NOTE — Progress Notes (Signed)
CC:   [name], M.D. [name], MD  PROBLEM LIST:  1. 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 IgG level on 08/17/2012 was 4760. Kappa light chains were 21.90. Beta 2 microglobulin 1.99. Albumin 3.4. Total protein 10.1 and globulin 6.7. 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 Stage by ISS for multiple myeloma is stage II. Metastatic bone survey carried out on 08/28/2012 was negative for evidence of multiple myeloma. A 24-hour urine protein collected on 09/16/2012 was 27 mg.  Urine immunofixation electrophoresis showed IgG heavy chains with  associated kappa light chains and excess monoclonal free kappa light  chains. Treatment with Velcade, Cytoxan and Decadron was started on 09/24/2012.   It will be recalled that this patient was diagnosed with  indolent/smoldering multiple myeloma, IgG kappa, when a bone marrow  carried out on 10/17/2009 showed 11% plasma cells. 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 IgG level on 11/30/2009 was 2870.  2. Leukopenia and neutropenia.  3. Anemia.  4. Adenocarcinoma of the prostate diagnosed on 12 core needle biopsies carried out on 11/05/2012.  Gleason score was 3+3=6.  Apparently all cores were involved.  Core involvement was less than 5%-40%.  Clinical stage is T1C.  PSA was 16.7.  5. Hypertension.  6. Hemangiomas of the liver seen on ultrasound carried out on  07/31/2009.  7. Lower back pain with radiation into the right leg.  Degenerative disk disease and spinal stenosis seen on an MRI of the  lumbar spine with and without IV contrast on 08/28/2012. There was no  evidence for myeloma or cancer. No pathologic fracture.  8. Status post laparoscopic cholecystectomy carried out in 2002. 9. Depression. 10. Systolic ejection murmur. 11. Financial and  medical insurance difficulties. 12. Right-sided IJ Port-A-Cath placed on 11/02/2012 by Dr. Jolaine Click from Inter[facility]iology.   MEDICATIONS:  Reviewed and recorded. Current Outpatient Prescriptions  Medication Sig Dispense Refill  . acyclovir (ZOVIRAX) 400 MG tablet Take 400 mg by mouth 2 (two) times daily.       [name] lidocaine-prilocaine (EMLA) cream Apply topically as needed. Apply to port site one hour before chemo and cover with plastic wrap.  30 g  3  . losartan (COZAAR) 100 MG tablet Take 100 mg by mouth every morning.       . metoCLOPramide (REGLAN) 10 MG tablet Take 1 tablet (10 mg total) by mouth every 6 (six) hours as needed (nausea/headache).  15 tablet  0  . ondansetron (ZOFRAN) 8 MG tablet Take 8 mg by mouth every 8 (eight) hours as needed for nausea.      [name] oxyCODONE-acetaminophen (PERCOCET/ROXICET) 5-325 MG per tablet Take 2 tablets by mouth every 4 (four) hours as needed for pain.      [name] PRESCRIPTION MEDICATION 1 Units by Other route every 7 (seven) days. Chemo (Velcade SQ and Cytoxan IV) once a week on Thursday      . citalopram (CELEXA) 20 MG tablet Take 1 tablet (20 mg total) by mouth daily.  30 tablet  2   No current facility-administered medications for this visit.   TREATMENT PROGRAM:  1. Velcade 1.3 mg/m2, equal to 2.5 mg subcu weekly.  2. Cytoxan 300 mg per meter squared equal to 500 mg IV weekly.  3. Decadron 40 mg IV weekly.  Chemotherapy was started on 09/24/2012.  IMMUNIZATIONS:  Pneumovax was given on 10/22/2012.  Flu shot was given in December 2013.    SMOKING HISTORY: The patient smoked up to 2 packs of cigarettes a day  for 5 years. He has not smoked since his mid 83s.    HISTORY:  Travis Nguyen was seen today for followup of his IgG kappa multiple myeloma, currently undergoing weekly treatment with Velcade, Cytoxan and Decadron.  Treatments were started on 09/24/2012.  These treatments are going extremely well.  The patient denies any  side effects or toxicities from these treatments.  He denies any neurotoxicity.  Unfortunately, Travis Nguyen's prostate biopsies carried out on 11/05/2012 came back positive as described above.  A treatment strategy is being worked out by Dr. Isabel Caprice.  The patient will be seeing Dr. Isabel Caprice on 12/14/2012.  Travis Nguyen's biggest problem is ongoing back pain with radiation of pain into his right leg.  He denies any weakness in his legs.  He is not falling.  He apparently was supposed to see a neurosurgeon.  I have urged him to call Dr. Merita Norton office and get a referral to a neurosurgeon.  He might benefit from some steroid injections.  It will be recalled that we did an MRI of the lumbosacral spine carried out on 08/28/2012.  There was a lot of degenerative disease and spinal stenosis at L4-L5.  Travis Nguyen is here today with his wife, Travis Nguyen.  He was last seen by [name] on 10/22/2012.  Both Hari and his wife are not working.  They do not have any health insurance.  They are trying to get Medicaid.  Travis Nguyen is having some problems with depression due to all these difficulties.  We are going to give him a prescription for Celexa 20 mg to take daily.  PHYSICAL EXAM:  Travis Nguyen looks well and is in good spirits.  Weight today is 158 pounds 8 ounces.  Height 6 feet 1.5 inches.  Body surface area 1.93 sq m.  Blood pressure 134/76.  Other vital signs are normal.  There is no scleral icterus.  Mouth and pharynx are benign.  There is no peripheral adenopathy palpable.  Lungs are clear to percussion and auscultation.  Cardiac exam regular rhythm with soft systolic ejection murmur.  Abdomen is benign with no organomegaly or masses palpable. Extremities:  No peripheral edema.  No localizing tenderness although pain is over the mid to upper lumbar spine.  Travis Nguyen seems to have good strength in his legs.  There was a hint that perhaps the right hip flexes may be a little weaker than the left.  He is able to do a  deep knee bend.  LABORATORY DATA:  White count 2.8, ANC 1.8, hemoglobin 10.3, hematocrit 30.6, platelets 173,000 today.  Chemistries from 10/22/2012 notable for total protein of 9.0, albumin 3.2 and thus globulins were 5.8.  BUN 28, creatinine 1.0.  LDH was 140 and normal.  IgG level had dropped to 4150 and the serum kappa light chain was 11.70.  IMAGING STUDIES:  1. Abdominal ultrasound carried out on 07/31/2009 showed  a hyperechoic lesion in the right lobe of the liver which basically had  not changed compared with the study carried out on 08/09/2006, felt to  be most consistent with a hemangioma. The patient was status post  cholecystectomy. Spleen was normal.  2. Metastatic bone survey on 08/08/2009 showed no lytic bone lesions.  3. Lumbar spine complete 4 views from 10/14/2011 showed L5-S1 and to a  lesser extent L4-L5 degenerative disk space narrowing.  Otherwise, a  negative study.  4. Sacrum and coccyx 2 views showed moderate bilateral hip joint  degenerative changes. There were femoral head subchondral cystic  changes and sclerosis, greater on the right. There were prominent  degenerative changes at L5-S1.  5. Chest x-ray 2 view from 08/03/2012 showed no active cardiopulmonary  Disease.  6. Metastatic bone survey from 08/28/2012 was negative for evidence of  multiple myeloma or any destructive lesions.  7. MRI of the lumbar spine with and without IV contrast from 08/28/2012  showed no evidence for multiple myeloma. There were no pathologic  fractures. There was heterogeneous marrow signal. There was  degenerative disk disease at L4-L5 and L5-S1. There was mild L4-L5  central stenosis and bilateral lateral recess stenosis which potentially  affects the descending L5 nerves. The L5-S1 left lateral recess  encroachment potentially affects the descending left S1 nerve.  8. Nuclear medicine whole body bone scan from 08/28/2012 showed activity  within multiple joints, most  likely arthritic or degenerative. There  was no pattern to suggest skeletal metastatic disease. 9. Right internal jugular vein Port-A-Cath was placed on 11/02/2012 by Dr. Jolaine Click from [facility].   IMPRESSION AND PLAN:  Travis Nguyen's chemotherapy this week will be given tomorrow 11/27/2012.  He will receive Velcade 2.5 mg subcu, Cytoxan 500 mg IV, Decadron 40 mg IV.  We will continue those treatments weekly specifically 05/23, 05/30, 06/01, 06/13 and 06/20.  We will plan to see Travis Nguyen again around 06/20 at which time we will check CBC, chemistries, IgG level and serum light chains.  Labs tomorrow will also include those same labs since protein studies and chemistries were not drawn today.  I should mention that Travis Nguyen is taking 2 Percocet a day.  His back pain is not interfering with sleep.  He is not falling.  His main problem sleeping appears to be due to having to get up every hour to urinate.   He does have frequency but it does appear that he has a fairly good urinary stream.  I have encouraged Travis Nguyen to call his primary physician, Dr. Azucena Cecil, to get a neurosurgical referral for evaluation of his lumbar back pain with radiation into the right leg.  As stated above, we will be seeing Travis Nguyen again in approximately 5 weeks which will be 06/20 at which point he will have complete labs.  At some point, we will refer him to a tertiary center for consideration of high dose treatment.    ______________________________ Samul Dada, M.D. DSM/MEDQ  D:  11/26/2012  T:  11/27/2012  Job:  [id number]

## 2012-11-30 LAB — KAPPA/LAMBDA LIGHT CHAINS
Kappa free light chain: 3.83 mg/dL — ABNORMAL HIGH (ref 0.33–1.94)
Kappa:Lambda Ratio: 21.28 — ABNORMAL HIGH (ref 0.26–1.65)

## 2012-11-30 LAB — IGG: IgG (Immunoglobin G), Serum: 2960 mg/dL — ABNORMAL HIGH (ref 650–1600)

## 2012-12-02 ENCOUNTER — Other Ambulatory Visit: Payer: Self-pay | Admitting: Oncology

## 2012-12-02 DIAGNOSIS — C9000 Multiple myeloma not having achieved remission: Secondary | ICD-10-CM

## 2012-12-04 ENCOUNTER — Other Ambulatory Visit: Payer: Self-pay | Admitting: Medical Oncology

## 2012-12-04 ENCOUNTER — Other Ambulatory Visit (HOSPITAL_BASED_OUTPATIENT_CLINIC_OR_DEPARTMENT_OTHER): Payer: No Typology Code available for payment source | Admitting: Lab

## 2012-12-04 ENCOUNTER — Ambulatory Visit (HOSPITAL_BASED_OUTPATIENT_CLINIC_OR_DEPARTMENT_OTHER): Payer: No Typology Code available for payment source

## 2012-12-04 ENCOUNTER — Encounter: Payer: Self-pay | Admitting: Oncology

## 2012-12-04 ENCOUNTER — Encounter: Payer: Self-pay | Admitting: Medical Oncology

## 2012-12-04 ENCOUNTER — Telehealth: Payer: Self-pay | Admitting: Medical Oncology

## 2012-12-04 VITALS — BP 123/66 | HR 81 | Temp 98.2°F

## 2012-12-04 DIAGNOSIS — C9000 Multiple myeloma not having achieved remission: Secondary | ICD-10-CM

## 2012-12-04 DIAGNOSIS — Z5112 Encounter for antineoplastic immunotherapy: Secondary | ICD-10-CM

## 2012-12-04 DIAGNOSIS — Z5111 Encounter for antineoplastic chemotherapy: Secondary | ICD-10-CM

## 2012-12-04 LAB — COMPREHENSIVE METABOLIC PANEL (CC13)
AST: 118 U/L — ABNORMAL HIGH (ref 5–34)
Albumin: 3.2 g/dL — ABNORMAL LOW (ref 3.5–5.0)
BUN: 11.3 mg/dL (ref 7.0–26.0)
Calcium: 8.6 mg/dL (ref 8.4–10.4)
Chloride: 104 mEq/L (ref 98–107)
Potassium: 4.1 mEq/L (ref 3.5–5.1)
Sodium: 137 mEq/L (ref 136–145)
Total Protein: 8.8 g/dL — ABNORMAL HIGH (ref 6.4–8.3)

## 2012-12-04 LAB — CBC WITH DIFFERENTIAL/PLATELET
Basophils Absolute: 0 10*3/uL (ref 0.0–0.1)
EOS%: 0.8 % (ref 0.0–7.0)
Eosinophils Absolute: 0 10*3/uL (ref 0.0–0.5)
HGB: 10.3 g/dL — ABNORMAL LOW (ref 13.0–17.1)
NEUT#: 1.6 10*3/uL (ref 1.5–6.5)
RDW: 14.2 % (ref 11.0–14.6)
lymph#: 0.7 10*3/uL — ABNORMAL LOW (ref 0.9–3.3)

## 2012-12-04 MED ORDER — HEPARIN SOD (PORK) LOCK FLUSH 100 UNIT/ML IV SOLN
500.0000 [IU] | Freq: Once | INTRAVENOUS | Status: AC
  Administered 2012-12-04: 500 [IU] via INTRAVENOUS
  Filled 2012-12-04: qty 5

## 2012-12-04 MED ORDER — ONDANSETRON HCL 8 MG PO TABS
8.0000 mg | ORAL_TABLET | Freq: Once | ORAL | Status: AC
  Administered 2012-12-04: 8 mg via ORAL

## 2012-12-04 MED ORDER — OXYCODONE HCL 5 MG PO CAPS: 5.0000 mg | ORAL_CAPSULE | ORAL | Status: DC | PRN

## 2012-12-04 MED ORDER — OXYCODONE HCL 20 MG PO TB12: 20.0000 mg | ORAL_TABLET | Freq: Two times a day (BID) | ORAL | Status: DC

## 2012-12-04 MED ORDER — DEXAMETHASONE SODIUM PHOSPHATE 20 MG/5ML IJ SOLN
40.0000 mg | Freq: Once | INTRAMUSCULAR | Status: AC
  Administered 2012-12-04: 40 mg via INTRAVENOUS

## 2012-12-04 MED ORDER — SODIUM CHLORIDE 0.9 % IJ SOLN
10.0000 mL | INTRAMUSCULAR | Status: DC | PRN
  Administered 2012-12-04: 10 mL via INTRAVENOUS
  Filled 2012-12-04: qty 10

## 2012-12-04 MED ORDER — SODIUM CHLORIDE 0.9 % IV SOLN
500.0000 mg | Freq: Once | INTRAVENOUS | Status: AC
  Administered 2012-12-04: 500 mg via INTRAVENOUS
  Filled 2012-12-04: qty 25

## 2012-12-04 MED ORDER — BORTEZOMIB CHEMO SQ INJECTION 3.5 MG (2.5MG/ML)
1.3000 mg/m2 | Freq: Once | INTRAMUSCULAR | Status: AC
  Administered 2012-12-04: 2.5 mg via SUBCUTANEOUS
  Filled 2012-12-04: qty 2.5

## 2012-12-04 NOTE — Telephone Encounter (Signed)
I called pt to ask if he has started any new medications. He states the only new medication is the celexa that Dr. Arline Asp gave him. I explained that his liver enzymes were elevated and Dr. Arline Asp is concerned with the sudden elevation. I questioned him about pain medications and use of tyelnol. He states that he has been taking percocet 5/325mg  two tabs every 4 hours and sometimes more than four hours. He also states that he has had a cold and has been taking extra tylenol. I discussed with Dr. Arline Asp and he is changing pain medications to oxycontin and oxyIR. Pt came to office to pick up the prescriptions and I discussed with him not to take any more percocet and tylenol. I explained how to take the oxycontin and oxyIr and I stressed not to take more than prescribed. He voiced understanding.

## 2012-12-04 NOTE — Patient Instructions (Addendum)
[facility] Discharge Instructions for Patients Receiving Chemotherapy  Today you received the following chemotherapy agents Velcade/Cytoxan.  To help prevent nausea and vomiting after your treatment, we encourage you to take your nausea medication as prescribed.   If you develop nausea and vomiting that is not controlled by your nausea medication, call the clinic. If it is after clinic hours your family physician or the after hours number for the clinic or go to the Emergency Department.   BELOW ARE SYMPTOMS THAT SHOULD BE REPORTED IMMEDIATELY:  *FEVER GREATER THAN 100.5 F  *CHILLS WITH OR WITHOUT FEVER  NAUSEA AND VOMITING THAT IS NOT CONTROLLED WITH YOUR NAUSEA MEDICATION  *UNUSUAL SHORTNESS OF BREATH  *UNUSUAL BRUISING OR BLEEDING  TENDERNESS IN MOUTH AND THROAT WITH OR WITHOUT PRESENCE OF ULCERS  *URINARY PROBLEMS  *BOWEL PROBLEMS  UNUSUAL RASH Items with * indicate a potential emergency and should be followed up as soon as possible.  Feel free to call the clinic you have any questions or concerns. The clinic phone number is [phone].   I have been informed and understand all the instructions given to me. I know to contact the clinic, my physician, or go to the Emergency Department if any problems should occur. I do not have any questions at this time, but understand that I may call the clinic during office hours   should I have any questions or need assistance in obtaining follow up care.    __________________________________________  _____________  __________ Signature of Patient or Authorized Representative            Date                   Time    __________________________________________ Nurse's Signature

## 2012-12-04 NOTE — Progress Notes (Signed)
The patient has been taking a lot of Tylenol as per the telephone documentation from earlier today, 12/04/2012. Careful review of this patient's medicines failed to reveal a better explanation for his elevated transaminases.  We will be repeating a CMET next Friday, 12/11/2012.  The patient was given a prescription for OxyContin 20 mg twice daily and OxyIR 5 mg to take 1-2 every 4 hours as needed for pain.  He was told not to take any more Tylenol or Tylenol-containing pain medicine.

## 2012-12-11 ENCOUNTER — Ambulatory Visit (HOSPITAL_BASED_OUTPATIENT_CLINIC_OR_DEPARTMENT_OTHER): Payer: No Typology Code available for payment source

## 2012-12-11 ENCOUNTER — Telehealth: Payer: Self-pay

## 2012-12-11 ENCOUNTER — Other Ambulatory Visit (HOSPITAL_BASED_OUTPATIENT_CLINIC_OR_DEPARTMENT_OTHER): Payer: No Typology Code available for payment source

## 2012-12-11 VITALS — BP 143/79 | HR 81 | Temp 98.7°F

## 2012-12-11 DIAGNOSIS — C9000 Multiple myeloma not having achieved remission: Secondary | ICD-10-CM

## 2012-12-11 DIAGNOSIS — Z5112 Encounter for antineoplastic immunotherapy: Secondary | ICD-10-CM

## 2012-12-11 LAB — COMPREHENSIVE METABOLIC PANEL (CC13)
ALT: 116 U/L — ABNORMAL HIGH (ref 0–55)
CO2: 26 mEq/L (ref 22–29)
Creatinine: 0.8 mg/dL (ref 0.7–1.3)
Glucose: 95 mg/dl (ref 70–99)
Total Bilirubin: 0.26 mg/dL (ref 0.20–1.20)

## 2012-12-11 LAB — CBC WITH DIFFERENTIAL/PLATELET
EOS%: 0.4 % (ref 0.0–7.0)
Eosinophils Absolute: 0 10*3/uL (ref 0.0–0.5)
LYMPH%: 33 % (ref 14.0–49.0)
MCH: 33.6 pg — ABNORMAL HIGH (ref 27.2–33.4)
MCV: 99 fL — ABNORMAL HIGH (ref 79.3–98.0)
MONO%: 4.7 % (ref 0.0–14.0)
NEUT#: 1.4 10*3/uL — ABNORMAL LOW (ref 1.5–6.5)
Platelets: 192 10*3/uL (ref 140–400)
RBC: 3.01 10*6/uL — ABNORMAL LOW (ref 4.20–5.82)
nRBC: 0 % (ref 0–0)

## 2012-12-11 LAB — LACTATE DEHYDROGENASE (CC13): LDH: 190 U/L (ref 125–245)

## 2012-12-11 MED ORDER — SODIUM CHLORIDE 0.9 % IV SOLN
500.0000 mg | Freq: Once | INTRAVENOUS | Status: AC
  Administered 2012-12-11: 500 mg via INTRAVENOUS
  Filled 2012-12-11: qty 25

## 2012-12-11 MED ORDER — SODIUM CHLORIDE 0.9 % IV SOLN: Freq: Once | INTRAVENOUS | Status: DC

## 2012-12-11 MED ORDER — HEPARIN SOD (PORK) LOCK FLUSH 100 UNIT/ML IV SOLN
500.0000 [IU] | Freq: Once | INTRAVENOUS | Status: AC
  Administered 2012-12-11: 500 [IU] via INTRAVENOUS
  Filled 2012-12-11: qty 5

## 2012-12-11 MED ORDER — SODIUM CHLORIDE 0.9 % IJ SOLN
10.0000 mL | INTRAMUSCULAR | Status: DC | PRN
  Administered 2012-12-11: 10 mL via INTRAVENOUS
  Filled 2012-12-11: qty 10

## 2012-12-11 MED ORDER — ONDANSETRON HCL 8 MG PO TABS
8.0000 mg | ORAL_TABLET | Freq: Once | ORAL | Status: AC
  Administered 2012-12-11: 8 mg via ORAL

## 2012-12-11 MED ORDER — DEXAMETHASONE SODIUM PHOSPHATE 20 MG/5ML IJ SOLN
40.0000 mg | Freq: Once | INTRAMUSCULAR | Status: AC
  Administered 2012-12-11: 40 mg via INTRAVENOUS

## 2012-12-11 MED ORDER — BORTEZOMIB CHEMO SQ INJECTION 3.5 MG (2.5MG/ML)
1.3000 mg/m2 | Freq: Once | INTRAMUSCULAR | Status: AC
Start: 2012-12-11 — End: 2012-12-11
  Administered 2012-12-11: 2.5 mg via SUBCUTANEOUS
  Filled 2012-12-11: qty 2.5

## 2012-12-11 NOTE — Progress Notes (Signed)
1015-OK treat with CBC results from today per Dr. Arline Asp.

## 2012-12-11 NOTE — Patient Instructions (Addendum)
[facility] Discharge Instructions for Patients Receiving Chemotherapy  Today you received the following chemotherapy agents: Velcade and Cytoxan  To help prevent nausea and vomiting after your treatment, we encourage you to take your nausea medication as ordered per MD.    If you develop nausea and vomiting that is not controlled by your nausea medication, call the clinic. If it is after clinic hours your family physician or the after hours number for the clinic or go to the Emergency Department.   BELOW ARE SYMPTOMS THAT SHOULD BE REPORTED IMMEDIATELY:  *FEVER GREATER THAN 100.5 F  *CHILLS WITH OR WITHOUT FEVER  NAUSEA AND VOMITING THAT IS NOT CONTROLLED WITH YOUR NAUSEA MEDICATION  *UNUSUAL SHORTNESS OF BREATH  *UNUSUAL BRUISING OR BLEEDING  TENDERNESS IN MOUTH AND THROAT WITH OR WITHOUT PRESENCE OF ULCERS  *URINARY PROBLEMS  *BOWEL PROBLEMS  UNUSUAL RASH Items with * indicate a potential emergency and should be followed up as soon as possible.   Please let the nurse know about any problems that you may have experienced. Feel free to call the clinic you have any questions or concerns. The clinic phone number is [phone].   I have been informed and understand all the instructions given to me. I know to contact the clinic, my physician, or go to the Emergency Department if any problems should occur. I do not have any questions at this time, but understand that I may call the clinic during office hours   should I have any questions or need assistance in obtaining follow up care.    __________________________________________  _____________  __________ Signature of Patient or Authorized Representative            Date                   Time    __________________________________________ Nurse's Signature

## 2012-12-11 NOTE — Telephone Encounter (Signed)
Was trying to let pt know of time change in his 6/20 appts.

## 2012-12-15 ENCOUNTER — Other Ambulatory Visit: Payer: Self-pay | Admitting: Urology

## 2012-12-17 ENCOUNTER — Encounter: Payer: Self-pay | Admitting: Oncology

## 2012-12-17 NOTE — Progress Notes (Signed)
My documentation note dated 11/13/2012 contains an error. Only 3 out of the 12 cores were involved with prostate cancer. All 3 cores showed Gleason 3+3= 6 disease. Core involvement was less than 5-40%. Clinical stage is T1C. PSA was greater than 10. The patient may be in the intermediate risk category due to his elevated PSA.   I received an office note from Dr. Barron Alvine dated 12/14/2012.  Dr. Isabel Caprice  had a very detailed discussion with the patient about various management strategies. He discussed the case with me regarding the prognosis for the patient's multiple myeloma. Apparently the patient has decided that he wants surgery which will probably involve a prostatectomy via a robotic approach.

## 2012-12-18 ENCOUNTER — Ambulatory Visit (HOSPITAL_BASED_OUTPATIENT_CLINIC_OR_DEPARTMENT_OTHER): Payer: No Typology Code available for payment source

## 2012-12-18 ENCOUNTER — Other Ambulatory Visit (HOSPITAL_BASED_OUTPATIENT_CLINIC_OR_DEPARTMENT_OTHER): Payer: No Typology Code available for payment source

## 2012-12-18 VITALS — BP 123/70 | HR 83 | Temp 97.9°F | Resp 18

## 2012-12-18 DIAGNOSIS — C9000 Multiple myeloma not having achieved remission: Secondary | ICD-10-CM

## 2012-12-18 DIAGNOSIS — Z5112 Encounter for antineoplastic immunotherapy: Secondary | ICD-10-CM

## 2012-12-18 DIAGNOSIS — Z5111 Encounter for antineoplastic chemotherapy: Secondary | ICD-10-CM

## 2012-12-18 LAB — CBC WITH DIFFERENTIAL/PLATELET
BASO%: 0.7 % (ref 0.0–2.0)
MCHC: 34.8 g/dL (ref 32.0–36.0)
MONO#: 0.1 10*3/uL (ref 0.1–0.9)
RBC: 2.86 10*6/uL — ABNORMAL LOW (ref 4.20–5.82)
WBC: 2.6 10*3/uL — ABNORMAL LOW (ref 4.0–10.3)
lymph#: 0.6 10*3/uL — ABNORMAL LOW (ref 0.9–3.3)
nRBC: 0 % (ref 0–0)

## 2012-12-18 MED ORDER — SODIUM CHLORIDE 0.9 % IJ SOLN
10.0000 mL | INTRAMUSCULAR | Status: DC | PRN
  Administered 2012-12-18: 10 mL via INTRAVENOUS
  Filled 2012-12-18: qty 10

## 2012-12-18 MED ORDER — BORTEZOMIB CHEMO SQ INJECTION 3.5 MG (2.5MG/ML)
1.3000 mg/m2 | Freq: Once | INTRAMUSCULAR | Status: AC
  Administered 2012-12-18: 2.5 mg via SUBCUTANEOUS
  Filled 2012-12-18: qty 2.5

## 2012-12-18 MED ORDER — ONDANSETRON HCL 8 MG PO TABS
8.0000 mg | ORAL_TABLET | Freq: Once | ORAL | Status: AC
  Administered 2012-12-18: 8 mg via ORAL

## 2012-12-18 MED ORDER — SODIUM CHLORIDE 0.9 % IV SOLN
Freq: Once | INTRAVENOUS | Status: AC
  Administered 2012-12-18: 10:00:00 via INTRAVENOUS

## 2012-12-18 MED ORDER — HEPARIN SOD (PORK) LOCK FLUSH 100 UNIT/ML IV SOLN
500.0000 [IU] | Freq: Once | INTRAVENOUS | Status: AC
  Administered 2012-12-18: 500 [IU] via INTRAVENOUS
  Filled 2012-12-18: qty 5

## 2012-12-18 MED ORDER — SODIUM CHLORIDE 0.9 % IV SOLN
500.0000 mg | Freq: Once | INTRAVENOUS | Status: AC
  Administered 2012-12-18: 500 mg via INTRAVENOUS
  Filled 2012-12-18: qty 25

## 2012-12-18 MED ORDER — DEXAMETHASONE SODIUM PHOSPHATE 20 MG/5ML IJ SOLN
40.0000 mg | Freq: Once | INTRAMUSCULAR | Status: AC
  Administered 2012-12-18: 40 mg via INTRAVENOUS

## 2012-12-18 NOTE — Patient Instructions (Addendum)
[facility] Discharge Instructions for Patients Receiving Chemotherapy  Today you received the following chemotherapy agents: velcade, cytoxan   To help prevent nausea and vomiting after your treatment, we encourage you to take your nausea medication.  Take it as often as prescribed.     If you develop nausea and vomiting that is not controlled by your nausea medication, call the clinic. If it is after clinic hours your family physician or the after hours number for the clinic or go to the Emergency Department.   BELOW ARE SYMPTOMS THAT SHOULD BE REPORTED IMMEDIATELY:  *FEVER GREATER THAN 100.5 F  *CHILLS WITH OR WITHOUT FEVER  NAUSEA AND VOMITING THAT IS NOT CONTROLLED WITH YOUR NAUSEA MEDICATION  *UNUSUAL SHORTNESS OF BREATH  *UNUSUAL BRUISING OR BLEEDING  TENDERNESS IN MOUTH AND THROAT WITH OR WITHOUT PRESENCE OF ULCERS  *URINARY PROBLEMS  *BOWEL PROBLEMS  UNUSUAL RASH Items with * indicate a potential emergency and should be followed up as soon as possible.  Feel free to call the clinic you have any questions or concerns. The clinic phone number is [phone].   I have been informed and understand all the instructions given to me. I know to contact the clinic, my physician, or go to the Emergency Department if any problems should occur. I do not have any questions at this time, but understand that I may call the clinic during office hours   should I have any questions or need assistance in obtaining follow up care.    __________________________________________  _____________  __________ Signature of Patient or Authorized Representative            Date                   Time    __________________________________________ Nurse's Signature

## 2012-12-21 ENCOUNTER — Encounter (HOSPITAL_COMMUNITY): Payer: Self-pay | Admitting: Pharmacy Technician

## 2012-12-21 ENCOUNTER — Encounter (HOSPITAL_COMMUNITY): Payer: Self-pay

## 2012-12-21 ENCOUNTER — Other Ambulatory Visit: Payer: Self-pay

## 2012-12-21 ENCOUNTER — Encounter (HOSPITAL_COMMUNITY)
Admission: RE | Admit: 2012-12-21 | Discharge: 2012-12-21 | Disposition: A | Discharge status: Home / Self Care | Payer: Self-pay | Source: Ambulatory Visit | Attending: Urology | Admitting: Urology

## 2012-12-21 DIAGNOSIS — C61 Malignant neoplasm of prostate: Secondary | ICD-10-CM | POA: Insufficient documentation

## 2012-12-21 DIAGNOSIS — Z01810 Encounter for preprocedural cardiovascular examination: Secondary | ICD-10-CM | POA: Insufficient documentation

## 2012-12-21 DIAGNOSIS — Z01812 Encounter for preprocedural laboratory examination: Secondary | ICD-10-CM | POA: Insufficient documentation

## 2012-12-21 HISTORY — DX: Calculus of gallbladder without cholecystitis without obstruction: K80.20

## 2012-12-21 HISTORY — DX: Unspecified osteoarthritis, unspecified site: M19.90

## 2012-12-21 HISTORY — DX: Pneumonia, unspecified organism: J18.9

## 2012-12-21 HISTORY — DX: Malignant melanoma of skin, unspecified: C43.9

## 2012-12-21 HISTORY — DX: Anemia, unspecified: D64.9

## 2012-12-21 LAB — BASIC METABOLIC PANEL
CO2: 29 mEq/L (ref 19–32)
Chloride: 102 mEq/L (ref 96–112)
Glucose, Bld: 88 mg/dL (ref 70–99)
Sodium: 134 mEq/L — ABNORMAL LOW (ref 135–145)

## 2012-12-21 LAB — CBC
Hemoglobin: 10 g/dL — ABNORMAL LOW (ref 13.0–17.0)
Platelets: 175 10*3/uL (ref 150–400)
RBC: 2.96 MIL/uL — ABNORMAL LOW (ref 4.22–5.81)
WBC: 2.8 10*3/uL — ABNORMAL LOW (ref 4.0–10.5)

## 2012-12-21 LAB — SURGICAL PCR SCREEN: MRSA, PCR: NEGATIVE

## 2012-12-21 NOTE — Progress Notes (Signed)
Chest x-ray 08/03/12 on [facility]

## 2012-12-21 NOTE — Progress Notes (Signed)
12/21/12 1315  OBSTRUCTIVE SLEEP APNEA  Have you ever been diagnosed with sleep apnea through a sleep study? No  Do you snore loudly (loud enough to be heard through closed doors)?  1  Do you often feel tired, fatigued, or sleepy during the daytime? 0  Has anyone observed you stop breathing during your sleep? 0  Do you have, or are you being treated for high blood pressure? 1  BMI more than 35 kg/m2? 0  Age over 60 years old? 1  Neck circumference greater than 40 cm/18 inches? 0  Gender: 1  Obstructive Sleep Apnea Score 4  Score 4 or greater  Results sent to PCP

## 2012-12-21 NOTE — Patient Instructions (Addendum)
22 [address]  12/21/2012   Your procedure is scheduled on: 12/23/12  Report to [facility] at 6:00 AM.  Call this number if you have problems the morning of surgery [phone]: [phone]   Remember:   Do not eat food or drink liquids After Midnight.     Take these medicines the morning of surgery with A SIP OF WATER: oxycodone if needed or zofran (nausea) if needed   Do not wear jewelry, make-up or nail polish.  Do not wear lotions, powders, or perfumes. You may wear deodorant.  Do not shave 48 hours prior to surgery. Men may shave face and neck.  Do not bring valuables to the hospital.  Contacts, dentures or bridgework may not be worn into surgery.  Leave suitcase in the car. After surgery it may be brought to your room.  For patients admitted to the hospital, checkout time is 11:00 AM the day of discharge.   Please read over the following fact sheets that you were given: MRSA Information, blood fact sheet Birdie Sons, RN  pre op nurse call if needed [phone]    FAILURE TO FOLLOW THESE INSTRUCTIONS MAY RESULT IN CANCELLATION OF YOUR SURGERY   Patient Signature: ___________________________________________

## 2012-12-22 ENCOUNTER — Encounter: Payer: Self-pay | Admitting: Oncology

## 2012-12-22 NOTE — Progress Notes (Signed)
I spoke with Dr. Barron Alvine today by telephone. The patient is apparently scheduled for a robotic prostatectomy tomorrow, 12/24/2011. The patient has been receiving chemotherapy weekly for his multiple myeloma.  I believe that the main priorities would be to proceed with treatment for his multiple myeloma, ultimately refer him for consideration of high-dose therapy with autologous stem cell rescue and for him to see a neurosurgeon regarding his low back pain with radiation into the right leg. It is my opinion that definitive treatment for the patient's prostate cancer can be deferred at least for now. Once we have proceeded with the patient's main problems, we can then address the definitive treatment for the patient's prostate cancer. One consideration would be to use hormones to treat the prostate cancer while we are in this observational stage.

## 2012-12-22 NOTE — H&P (Signed)
History of Present Illness           Travis Nguyen is currently 62 years of age and has had significantly elevated PSA.  We reassessed that in February of this year and found it to be 16.7.  Rectal exam did not show anything of concern.  From a voiding standpoint, he does not appear to have a lot of obstructive symptoms, but really has pretty problematic urgency, frequency, and nocturia.  He currently is not sexually active.  The patient's ultrasound revealed a 24 gram prostate.  Unfortunately, biopsies were positive but only 3 out of 12 cores and all cores showed Gleason 3+3=6 disease.  Core involvement was less than 5% to 40% and clearly he has a low risk/favorable clinical stage T1c prostate cancer based on biopsy, although his PSA of over 10 does questionably put him into an intermediate risk category.  It certainly appears that his PSA is elevated out of proportion of what we are finding pathologically.  He has not had any intraabdominal surgery.  Complicating factors include current ongoing treatment now for multiple myeloma.  I did speak to his oncologist with regard to his myeloma. He is going to be considered for higher dose therapy. It is difficult to predict his 5 and 10 year survival at this point.  The patient's overall voiding is actually worsen I had previously expected. His AUA symptom score is currently 18 his biggest issues continue to be nocturia and urgency. I suspect he is under reported his symptom score. He has acknowledged no problem with frequency but a more careful questioning states he voids every hour. His sexual function is cores extremely low at 6/25. He has had previous laparoscopic cholecystectomy.    Past Medical History Problems  1. History of  Anemia 285.9 2. History of  Back Pain 3. History of  Hypertension 401.9 4. History of  Leukopenia 288.50 5. History of  Multiple Myeloma 203.00 6. History of  Murmurs 785.2 7. History of  Neutropenia 288.00  Surgical  History Problems  1. History of  Cholecystectomy  Current Meds 1. Levofloxacin 500 MG Oral Tablet; TAKE 1 TABLET Daily Start taking the day prior to procedure  date; Therapy: 25Feb2014 to (Evaluate:03Apr2014)  Requested for: 01Apr2014; Last  Rx:31Mar2014 2. Losartan Potassium 100 MG Oral Tablet; Therapy: (Recorded:03Feb2014) to  Allergies Medication  1. No Known Drug Allergies  Family History Problems  1. Family history of  Family Health Status Number Of Children 1 son 2. Maternal history of  Hypertension V17.49 3. Paternal history of  Prostate Cancer V16.42  Social History Problems  1. Former Smoker V15.82 2ppd for 97yrs, quit in his mid 20's 2. Marital History - Currently Married 3. 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 History of  Alcohol Use  Review of Systems Genitourinary, constitutional, skin, eye, otolaryngeal, hematologic/lymphatic, cardiovascular, pulmonary, endocrine, musculoskeletal, gastrointestinal, neurological and psychiatric system(s) were reviewed and pertinent findings if present are noted.  Genitourinary: urinary frequency and nocturia, but urine stream is not weak.   Physical Exam Constitutional: Well nourished and well developed . No acute distress.  ENT:. The ears and nose are normal in appearance.  Neck: The appearance of the neck is normal and no neck mass is present.  Pulmonary: No respiratory distress and normal respiratory rhythm and effort.  Cardiovascular: Heart rate and rhythm are normal . No peripheral edema.  Abdomen: The abdomen is soft and nontender. No masses are palpated. No CVA tenderness. No hernias are palpable. No hepatosplenomegaly noted.  Rectal:  Rectal exam demonstrates normal sphincter tone, no tenderness and no masses. Estimated prostate size is 1+. Normal rectal tone, no rectal masses, prostate is smooth, symmetric and non-tender. The prostate has no nodularity and is tender. The left seminal vesicle is nonpalpable. The right seminal  vesicle is nonpalpable. The perineum is normal on inspection.  Genitourinary: Examination of the penis demonstrates no discharge, no masses, no lesions and a normal meatus. The scrotum is without lesions. The right epididymis is palpably normal and non-tender. The left epididymis is palpably normal and non-tender. The right testis is non-tender and without masses. The left testis is non-tender and without masses.  Lymphatics: The femoral and inguinal nodes are not enlarged or tender.  Skin: Normal skin turgor, no visible rash and no visible skin lesions.  Neuro/Psych:. Mood and affect are appropriate.   Assessment Assessed  1. Prostate Cancer 185  Plan Prostate Cancer (185)  1. Follow-up Schedule Surgery Office  Follow-up  Done: 02Jun2014  Discussion/Summary  The patient was counseled about the natural history of prostate cancer and the standard treatment options that are available for prostate cancer. It was explained to him how his age and life expectancy, clinical stage, Gleason score, and PSA affect his prognosis, the decision to proceed with additional staging studies, as well as how that information influences recommended treatment strategies. We discussed the roles for active surveillance, radiation therapy, surgical therapy, androgen deprivation, as well as ablative therapy options for the treatment of prostate cancer as appropriate to his individual cancer situation. We discussed the risks and benefits of these options with regard to their impact on cancer control and also in terms of potential adverse events, complications, and impact on quiality of life particularly related to urinary, bowel, and sexual function. The patient was encouraged to ask questions throughout the discussion today and all questions were answered to his stated satisfaction. In addition, the patient was provided with and/or directed to appropriate resources and literature for further education about prostate cancer  and treatment options.   We discussed surgical therapy for prostate cancer including the different available surgical approaches. We discussed, in detail, the risks and expectations of surgery with regard to cancer control, urinary control, and erectile function as well as the expected postoperative recovery process. The risks, potential complications/adverse events of radical prostatectomy as well as alternative options were explained to the patient.   We discussed surgical therapy for prostate cancer including the different available surgical approaches. We discussed, in detail, the risks and expectations of surgery with regard to cancer control, urinary control, and erectile function as well as the expected postoperative recovery process. Additional risks of surgery including but not limited to bleeding, infection, hernia formation, nerve damage, lymphocele formation, bowel/rectal injury potentially necessitating colostomy, damage to the urinary tract resulting in urine leakage, urethral stricture, and the cardiopulmonary risks such as myocardial infarction, stroke, death, venothromboembolism, etc. were explained. The risk of open surgical conversion for robotic/laparoscopic prostatectomy was also discussed.    Amendment Travis Nguyen would prefer a surgical approach. I would recommend active treatment given his PSA and number of positive biopsy cores. Radiation may be problematic given his severe bladder of her activity and he indeed may do better with a surgical approach. It is possible that he has more aggressive disease than biopsy indicates given his PSA levels. It appears that he has fairly severe ED going into surgery and that is does not appear to be a major issue to them. He would prior be a candidate  for limited bilateral nerve spare.   Signatures Electronically signed by : Barron Alvine, M.D.; Dec 14 2012  4:35PM

## 2012-12-23 ENCOUNTER — Encounter (HOSPITAL_COMMUNITY): Admission: RE | Payer: Self-pay | Source: Ambulatory Visit

## 2012-12-23 ENCOUNTER — Ambulatory Visit (HOSPITAL_COMMUNITY): Admission: RE | Admit: 2012-12-23 | Payer: Self-pay | Source: Ambulatory Visit | Admitting: Urology

## 2012-12-23 LAB — TYPE AND SCREEN: Antibody Screen: NEGATIVE

## 2012-12-23 SURGERY — ROBOTIC ASSISTED LAPAROSCOPIC RADICAL PROSTATECTOMY
Anesthesia: General

## 2012-12-25 ENCOUNTER — Other Ambulatory Visit (HOSPITAL_BASED_OUTPATIENT_CLINIC_OR_DEPARTMENT_OTHER): Payer: No Typology Code available for payment source

## 2012-12-25 DIAGNOSIS — C9000 Multiple myeloma not having achieved remission: Secondary | ICD-10-CM

## 2012-12-25 LAB — CBC WITH DIFFERENTIAL/PLATELET
Eosinophils Absolute: 0 10*3/uL (ref 0.0–0.5)
MCV: 100.2 fL — ABNORMAL HIGH (ref 79.3–98.0)
MONO#: 0.1 10*3/uL (ref 0.1–0.9)
MONO%: 2.9 % (ref 0.0–14.0)
NEUT#: 2.1 10*3/uL (ref 1.5–6.5)
RBC: 2.9 10*6/uL — ABNORMAL LOW (ref 4.20–5.82)
RDW: 15.1 % — ABNORMAL HIGH (ref 11.0–14.6)
WBC: 2.9 10*3/uL — ABNORMAL LOW (ref 4.0–10.3)

## 2012-12-31 ENCOUNTER — Telehealth: Payer: Self-pay

## 2012-12-31 NOTE — Telephone Encounter (Signed)
Pt asking if we have received medical disability papers from [employer]. No note in epic found. Will check with medical records.

## 2013-01-01 ENCOUNTER — Ambulatory Visit (HOSPITAL_BASED_OUTPATIENT_CLINIC_OR_DEPARTMENT_OTHER): Payer: No Typology Code available for payment source

## 2013-01-01 ENCOUNTER — Other Ambulatory Visit (HOSPITAL_BASED_OUTPATIENT_CLINIC_OR_DEPARTMENT_OTHER): Payer: No Typology Code available for payment source

## 2013-01-01 ENCOUNTER — Telehealth: Payer: Self-pay | Admitting: Oncology

## 2013-01-01 ENCOUNTER — Telehealth: Payer: Self-pay | Admitting: *Deleted

## 2013-01-01 ENCOUNTER — Ambulatory Visit (HOSPITAL_BASED_OUTPATIENT_CLINIC_OR_DEPARTMENT_OTHER): Payer: No Typology Code available for payment source | Admitting: Oncology

## 2013-01-01 ENCOUNTER — Encounter: Payer: Self-pay | Admitting: Oncology

## 2013-01-01 VITALS — BP 148/87 | HR 68 | Temp 96.8°F | Resp 18 | Ht 73.5 in | Wt 162.3 lb

## 2013-01-01 DIAGNOSIS — R972 Elevated prostate specific antigen [PSA]: Secondary | ICD-10-CM

## 2013-01-01 DIAGNOSIS — M545 Low back pain, unspecified: Secondary | ICD-10-CM

## 2013-01-01 DIAGNOSIS — C61 Malignant neoplasm of prostate: Secondary | ICD-10-CM

## 2013-01-01 DIAGNOSIS — Z5111 Encounter for antineoplastic chemotherapy: Secondary | ICD-10-CM

## 2013-01-01 DIAGNOSIS — Z5112 Encounter for antineoplastic immunotherapy: Secondary | ICD-10-CM

## 2013-01-01 DIAGNOSIS — C9000 Multiple myeloma not having achieved remission: Secondary | ICD-10-CM

## 2013-01-01 LAB — CBC WITH DIFFERENTIAL/PLATELET
BASO%: 0.4 % (ref 0.0–2.0)
Basophils Absolute: 0 10*3/uL (ref 0.0–0.1)
EOS%: 0.2 % (ref 0.0–7.0)
MONO%: 3.6 % (ref 0.0–14.0)
NEUT#: 2.3 10*3/uL (ref 1.5–6.5)
NEUT%: 70.1 % (ref 39.0–75.0)
Platelets: 199 10*3/uL (ref 140–400)
RDW: 15.1 % — ABNORMAL HIGH (ref 11.0–14.6)
WBC: 3.3 10*3/uL — ABNORMAL LOW (ref 4.0–10.3)

## 2013-01-01 LAB — COMPREHENSIVE METABOLIC PANEL (CC13)
Albumin: 3 g/dL — ABNORMAL LOW (ref 3.5–5.0)
BUN: 9.2 mg/dL (ref 7.0–26.0)
CO2: 24 mEq/L (ref 22–29)
Calcium: 8.6 mg/dL (ref 8.4–10.4)
Chloride: 105 mEq/L (ref 98–107)
Creatinine: 0.8 mg/dL (ref 0.7–1.3)
Glucose: 106 mg/dl — ABNORMAL HIGH (ref 70–99)
Potassium: 4.2 mEq/L (ref 3.5–5.1)

## 2013-01-01 LAB — LACTATE DEHYDROGENASE (CC13): LDH: 159 U/L (ref 125–245)

## 2013-01-01 MED ORDER — BORTEZOMIB CHEMO SQ INJECTION 3.5 MG (2.5MG/ML)
1.3000 mg/m2 | Freq: Once | INTRAMUSCULAR | Status: AC
  Administered 2013-01-01: 2.5 mg via SUBCUTANEOUS
  Filled 2013-01-01: qty 2.5

## 2013-01-01 MED ORDER — DEXAMETHASONE SODIUM PHOSPHATE 20 MG/5ML IJ SOLN
40.0000 mg | Freq: Once | INTRAMUSCULAR | Status: AC
  Administered 2013-01-01: 40 mg via INTRAVENOUS

## 2013-01-01 MED ORDER — ONDANSETRON HCL 8 MG PO TABS
8.0000 mg | ORAL_TABLET | Freq: Once | ORAL | Status: AC
  Administered 2013-01-01: 8 mg via ORAL

## 2013-01-01 MED ORDER — SODIUM CHLORIDE 0.9 % IV SOLN
500.0000 mg | Freq: Once | INTRAVENOUS | Status: AC
  Administered 2013-01-01: 500 mg via INTRAVENOUS
  Filled 2013-01-01: qty 25

## 2013-01-01 NOTE — Progress Notes (Signed)
This office note has been dictated.  #[id number]

## 2013-01-01 NOTE — Patient Instructions (Signed)
[facility] Discharge Instructions for Patients Receiving Chemotherapy  Today you received the following chemotherapy agents: Velcade and Cytoxan. To help prevent nausea and vomiting after your treatment, we encourage you to take your nausea medication, Zofran. Take it every eight hours as needed for nausea.   If you develop nausea and vomiting that is not controlled by your nausea medication, call the clinic.   BELOW ARE SYMPTOMS THAT SHOULD BE REPORTED IMMEDIATELY:  *FEVER GREATER THAN 100.5 F  *CHILLS WITH OR WITHOUT FEVER  NAUSEA AND VOMITING THAT IS NOT CONTROLLED WITH YOUR NAUSEA MEDICATION  *UNUSUAL SHORTNESS OF BREATH  *UNUSUAL BRUISING OR BLEEDING  TENDERNESS IN MOUTH AND THROAT WITH OR WITHOUT PRESENCE OF ULCERS  *URINARY PROBLEMS  *BOWEL PROBLEMS  UNUSUAL RASH Items with * indicate a potential emergency and should be followed up as soon as possible.  Feel free to call the clinic you have any questions or concerns. The clinic phone number is [phone].

## 2013-01-01 NOTE — Telephone Encounter (Signed)
Per staff message and POF I have scheduled appts.  JMW  

## 2013-01-01 NOTE — Progress Notes (Signed)
CC:   Travis Nguyen, M.D. [name], MD  PROBLEM LIST:  1. 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 IgG level on 08/17/2012 was 4760. Kappa light chains were 21.90. Beta 2 microglobulin 1.99. Albumin 3.4. Total protein 10.1 and globulin 6.7. 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 Stage by ISS for multiple myeloma is stage II. Metastatic bone survey carried out on 08/28/2012 was negative for evidence of multiple myeloma. A 24-hour urine protein collected on 09/16/2012 was 27 mg.  Urine immunofixation electrophoresis showed IgG heavy chains with  associated kappa light chains and excess monoclonal free kappa light  chains. Treatment with Velcade, Cytoxan and Decadron was started on 09/24/2012.  It will be recalled that this patient was diagnosed with  indolent/smoldering multiple myeloma, IgG kappa, when a bone marrow  carried out on 10/17/2009 showed 11% plasma cells. 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 IgG level on 11/30/2009 was 2870.  2. Leukopenia and neutropenia.  3. Anemia.  4. Adenocarcinoma of the prostate diagnosed on 12 core needle biopsies  carried out on 11/05/2012. Gleason score was 3+3=6. Three out of the 12  cores were involved. Core involvement was less than 5%-40%. Clinical  stage is T1C. PSA was 16.7.  5. Hypertension.  6. Hemangiomas of the liver seen on ultrasound carried out on  07/31/2009.  7. Lower back pain with radiation into the right leg.  Degenerative disk disease and spinal stenosis seen on an MRI of the  lumbar spine with and without IV contrast on 08/28/2012. There was no  evidence for myeloma or cancer. No pathologic fracture.  8. Status post laparoscopic cholecystectomy carried out in 2002.  9. Depression.  10. Systolic ejection murmur.  11. Financial and  medical insurance difficulties.  12. Elevated liver function tests in mid and late May 2014 secondary to excessive acetaminophen usage.  After the patient stopped taking acetaminophen, transaminases had returned to normal. 13. Right-sided IJ Port-A-Cath placed on 11/02/2012 by Dr. Jolaine Click from  Interventional Radiology.   MEDICATIONS:  Reviewed and recorded. Current Outpatient Prescriptions  Medication Sig Dispense Refill  . lidocaine-prilocaine (EMLA) cream Apply topically as needed. Apply to port site one hour before chemo and cover with plastic wrap.  30 g  3  . losartan (COZAAR) 100 MG tablet Take 100 mg by mouth every morning.       . metoCLOPramide (REGLAN) 10 MG tablet Take 1 tablet (10 mg total) by mouth every 6 (six) hours as needed (nausea/headache).  15 tablet  0  . ondansetron (ZOFRAN) 8 MG tablet Take 8 mg by mouth every 8 (eight) hours as needed for nausea.      [name] oxyCODONE (OXYCONTIN) 20 MG 12 hr tablet Take 20 mg by mouth every 12 (twelve) hours.      [name] PRESCRIPTION MEDICATION 1 Units by Other route every 7 (seven) days. Chemo (Velcade SQ and Cytoxan IV) once a week on friday       No current facility-administered medications for this visit.    TREATMENT PROGRAM:  1. Velcade 1.3 mg/m2, equal to 2.5 mg subcu weekly.  2. Cytoxan 300 mg per meter squared equal to 500 mg IV weekly.  3. Decadron 40 mg IV weekly.  Chemotherapy was started on 09/24/2012.    IMMUNIZATIONS:  Pneumovax was given on 10/22/2012.  Flu shot  was given in December 2013.    SMOKING HISTORY: The patient smoked up to 2 packs of cigarettes a day  for 5 years. He has not smoked since his mid 80s.    HISTORY:  I saw Travis Nguyen today for followup of his IgG kappa multiple myeloma, currently undergoing weekly treatments with Velcade, Cytoxan, and Decadron.  Treatments were started on 09/24/2012.  Travis Nguyen was last seen by Travis Nguyen on 11/26/2012.  He is here today by himself. As stated, he has  continued with weekly treatments which he has tolerated well.  Because of a scheduling error, [name] did not receive chemotherapy last week on 12/25/2012.  Shortly after his visit here on 11/26/2012, we noted that Travis Nguyen's transaminases were elevated.  On 12/04/2012, AST was 118, ALT 233.  We discovered that Travis Nguyen was taking a lot of Tylenol and urged him to stop taking Tylenol.  With that, his transaminases have returned to normal as of today.  I have spoken with Dr. Barron Alvine about Travis Nguyen's prostate cancer. Plans for a robotic prostatectomy were put on hold.  I believe that management of Travis Nguyen's multiple myeloma needs to take priority for the moment over any surgery or radiation treatments for his prostate cancer. Either the prostate cancer needs to be under continued observation, at least for the time being, or perhaps Travis Nguyen may benefit from some hormonal intervention as a temporizing measure until a definitive treatment strategy for the prostate cancer can be put into effect.  For now, Travis Nguyen is continuing with his weekly chemotherapy, and we have plans to refer him to Dr. Barbaraann Boys at [facility] for consideration of high- dose chemotherapy and autologous stem cell rescue.  Travis Nguyen's main problem continues to be low back pain with radiation into his right leg.  This has been a chronic problem.  It will be recalled that imaging evaluation with an MRI of the lumbar spine carried out on 08/28/2012 showed no evidence for multiple myeloma or for prostate cancer.  There was a degenerative change with a mild L4-L5 central stenosis and bilateral lateral recess stenosis which may be affecting the descending L5 nerves.  Apparently Travis Nguyen had a couple of appointments scheduled with an orthopedic surgeon for evaluation and possible steroid injections, but apparently those appointments were not kept.  I have spoken today with Dr. Azucena Cecil to outline for him the main issues and treatment  strategies regarding [name].  Dr. Azucena Cecil will make another appointment for Travis Nguyen to see an orthopedic specialist.  In addition, Buren apparently has applied for disability.  Dr. Azucena Cecil tells me that those forms have not arrived in his office.  I have relayed this to Gerlene Burdock and told him that perhaps the best strategy would be for him to get the forms and to hand deliver them to Dr. Azucena Cecil or myself so that they can be completed.  At this point, I think it is reasonable that Rayshaun be on disability.  Aside from these issues, Vang seems to be doing generally well.  He is now on OxyContin 20 mg twice daily.  He is not taking any Tylenol, nor is he taking any of the short-acting Oxy-IR that we had prescribed for him for breakthrough pain.  Lacy is scheduled for his weekly chemotherapy today.  PHYSICAL EXAMINATION:  General:  On physical exam Branton looks well. He does not appear to be in pain at this time.  Vital Signs:  Weight is 162 pounds 4.8 ounces, height 6 feet 1-1/2 inches, body surface area 1.95 meters  squared.  Blood pressure 148/87.  Other vital signs are normal.  HEENT:  There is no scleral icterus.  Mouth and pharynx are benign.  Lymph:  No peripheral adenopathy palpable.  Lungs:  Clear to percussion and auscultation.  Cardiac:  Regular rhythm with systolic ejection murmur, more obvious when the patient is lying down.  Abdomen: Benign, with no organomegaly or masses palpable.  Extremities:  No peripheral edema or clubbing.  No localizing tenderness.  Jeevan has excellent strength in both legs.  Lines:  There is a right-sided Port-A- Cath covered with EMLA cream.  LABORATORY DATA:  White count 3.3, ANC 2.3, hemoglobin 9.7, hematocrit 27.1, platelets 199,000.  Chemistries today:  Sodium 134, BUN 9, creatinine 0.8.  Albumin 3.0, total protein 8.6 and, thus, globulins are 5.6.  LDH 159.  IgG level and serum light chains are pending.  On 11/27/2012, IgG level was  2960, down from 4150 on 10/22/2012.  Serum kappa light chains were 3.83 on 11/27/2012, down from 11.70 on 10/22/2012 and 15.40 on 09/14/2012.   IMAGING STUDIES:  1. Abdominal ultrasound carried out on 07/31/2009 showed  a hyperechoic lesion in the right lobe of the liver which basically had  not changed compared with the study carried out on 08/09/2006, felt to  be most consistent with a hemangioma. The patient was status post  cholecystectomy. Spleen was normal.  2. Metastatic bone survey on 08/08/2009 showed no lytic bone lesions.  3. Lumbar spine complete 4 views from 10/14/2011 showed L5-S1 and to a  lesser extent L4-L5 degenerative disk space narrowing. Otherwise, a  negative study.  4. Sacrum and coccyx 2 views showed moderate bilateral hip joint  degenerative changes. There were femoral head subchondral cystic  changes and sclerosis, greater on the right. There were prominent  degenerative changes at L5-S1.  5. Chest x-ray 2 view from 08/03/2012 showed no active cardiopulmonary  Disease.  6. Metastatic bone survey from 08/28/2012 was negative for evidence of  multiple myeloma or any destructive lesions.  7. MRI of the lumbar spine with and without IV contrast from 08/28/2012  showed no evidence for multiple myeloma. There were no pathologic  fractures. There was heterogeneous marrow signal. There was  degenerative disk disease at L4-L5 and L5-S1. There was mild L4-L5  central stenosis and bilateral lateral recess stenosis which potentially  affects the descending L5 nerves. The L5-S1 left lateral recess  encroachment potentially affects the descending left S1 nerve.  8. Nuclear medicine whole body bone scan from 08/28/2012 showed activity  within multiple joints, most likely arthritic or degenerative. There  was no pattern to suggest skeletal metastatic disease.  9. Right internal jugular vein Port-A-Cath was placed on 11/02/2012 by Dr.  Jolaine Click from Interventional  Radiology.    IMPRESSION AND PLAN:  Jaray appears to be having a slow response to the current treatment for his multiple myeloma.  We will proceed with the same doses as previously, specifically, Velcade 2.5 mg subcutaneous, Cytoxan 500 mg IV, Decadron 40 mg IV.  Arturo will continue to have weekly CBCs and chemotherapy as tolerated.  He has been scheduled for 01/08/2013, 01/14/2013, 01/21/2013, and again on 01/28/2013.  We are switching from Fridays to Thursdays because of the July 4th holiday.  We will plan to see Tovia again on 01/28/2013, at which time he will have a CBC, chemistries, quantitative immunoglobulins, serum protein electrophoresis, serum immunofixation electrophoresis, serum light chains, and beta 2 microglobulin.  We will probably go ahead and make a referral  for Harve to see Dr. Barbaraann Boys in preparation for high-dose melphalan and autologous stem cell rescue.  Dr. Azucena Cecil will go ahead and make a referral again to an orthopedic specialist for evaluation of [name]'s back pain and possible epidural injections.  I should mention that Benjamine denies any dysuria or hematuria.  He is passing his urine every couple of hours during the day, and he gets up 5 to 6 times at night.  It will be recalled that his metastatic bone survey on 08/28/2012 showed no evidence of any osteolytic lesions indicative of multiple myeloma.  Today's visit was rather lengthy, lasting about 1 hour.    ______________________________ Samul Dada, M.D. DSM/MEDQ  D:  01/01/2013  T:  01/01/2013  Job:  [id number]

## 2013-01-05 ENCOUNTER — Encounter: Payer: Self-pay | Admitting: Oncology

## 2013-01-05 NOTE — Progress Notes (Signed)
Faxed clinical information from January to present to [facility]ability @ [phone].

## 2013-01-08 ENCOUNTER — Telehealth: Payer: Self-pay | Admitting: *Deleted

## 2013-01-08 ENCOUNTER — Other Ambulatory Visit (HOSPITAL_BASED_OUTPATIENT_CLINIC_OR_DEPARTMENT_OTHER): Payer: No Typology Code available for payment source | Admitting: Lab

## 2013-01-08 ENCOUNTER — Ambulatory Visit (HOSPITAL_BASED_OUTPATIENT_CLINIC_OR_DEPARTMENT_OTHER): Payer: No Typology Code available for payment source

## 2013-01-08 VITALS — BP 131/79 | HR 72 | Temp 97.8°F | Resp 18

## 2013-01-08 DIAGNOSIS — C9000 Multiple myeloma not having achieved remission: Secondary | ICD-10-CM

## 2013-01-08 DIAGNOSIS — Z5111 Encounter for antineoplastic chemotherapy: Secondary | ICD-10-CM

## 2013-01-08 DIAGNOSIS — Z5112 Encounter for antineoplastic immunotherapy: Secondary | ICD-10-CM

## 2013-01-08 LAB — CBC WITH DIFFERENTIAL/PLATELET
Eosinophils Absolute: 0 10*3/uL (ref 0.0–0.5)
MCV: 98.7 fL — ABNORMAL HIGH (ref 79.3–98.0)
MONO#: 0.2 10*3/uL (ref 0.1–0.9)
MONO%: 4.7 % (ref 0.0–14.0)
NEUT#: 2.2 10*3/uL (ref 1.5–6.5)
RBC: 3.01 10*6/uL — ABNORMAL LOW (ref 4.20–5.82)
RDW: 13.9 % (ref 11.0–14.6)
WBC: 3.4 10*3/uL — ABNORMAL LOW (ref 4.0–10.3)
lymph#: 1 10*3/uL (ref 0.9–3.3)

## 2013-01-08 MED ORDER — DEXAMETHASONE SODIUM PHOSPHATE 20 MG/5ML IJ SOLN
40.0000 mg | Freq: Once | INTRAMUSCULAR | Status: AC
  Administered 2013-01-08: 40 mg via INTRAVENOUS

## 2013-01-08 MED ORDER — SODIUM CHLORIDE 0.9 % IV SOLN
500.0000 mg | Freq: Once | INTRAVENOUS | Status: AC
  Administered 2013-01-08: 500 mg via INTRAVENOUS
  Filled 2013-01-08: qty 25

## 2013-01-08 MED ORDER — ONDANSETRON HCL 8 MG PO TABS
8.0000 mg | ORAL_TABLET | Freq: Once | ORAL | Status: AC
  Administered 2013-01-08: 8 mg via ORAL

## 2013-01-08 MED ORDER — BORTEZOMIB CHEMO SQ INJECTION 3.5 MG (2.5MG/ML)
1.3000 mg/m2 | Freq: Once | INTRAMUSCULAR | Status: AC
  Administered 2013-01-08: 2.5 mg via SUBCUTANEOUS
  Filled 2013-01-08: qty 2.5

## 2013-01-08 NOTE — Patient Instructions (Signed)
[facility] Discharge Instructions for Patients Receiving Chemotherapy  Today you received the following chemotherapy agents Velcade and Cytoxan.  To help prevent nausea and vomiting after your treatment, we encourage you to take your nausea medication.   If you develop nausea and vomiting that is not controlled by your nausea medication, call the clinic.   BELOW ARE SYMPTOMS THAT SHOULD BE REPORTED IMMEDIATELY:  *FEVER GREATER THAN 100.5 F  *CHILLS WITH OR WITHOUT FEVER  NAUSEA AND VOMITING THAT IS NOT CONTROLLED WITH YOUR NAUSEA MEDICATION  *UNUSUAL SHORTNESS OF BREATH  *UNUSUAL BRUISING OR BLEEDING  TENDERNESS IN MOUTH AND THROAT WITH OR WITHOUT PRESENCE OF ULCERS  *URINARY PROBLEMS  *BOWEL PROBLEMS  UNUSUAL RASH Items with * indicate a potential emergency and should be followed up as soon as possible.  Feel free to call the clinic you have any questions or concerns. The clinic phone number is [phone].

## 2013-01-08 NOTE — Telephone Encounter (Signed)
Per staff message and POF I have scheduled appts.  JMW  

## 2013-01-11 ENCOUNTER — Telehealth: Payer: Self-pay | Admitting: Oncology

## 2013-01-11 NOTE — Telephone Encounter (Signed)
Called pt and left message regarding lab and chemo on 01/14/13

## 2013-01-13 ENCOUNTER — Other Ambulatory Visit: Payer: Self-pay | Admitting: *Deleted

## 2013-01-14 ENCOUNTER — Other Ambulatory Visit (HOSPITAL_BASED_OUTPATIENT_CLINIC_OR_DEPARTMENT_OTHER): Payer: No Typology Code available for payment source

## 2013-01-14 ENCOUNTER — Ambulatory Visit (HOSPITAL_BASED_OUTPATIENT_CLINIC_OR_DEPARTMENT_OTHER): Payer: No Typology Code available for payment source

## 2013-01-14 VITALS — BP 137/82 | HR 71 | Temp 97.6°F

## 2013-01-14 DIAGNOSIS — C9000 Multiple myeloma not having achieved remission: Secondary | ICD-10-CM

## 2013-01-14 DIAGNOSIS — Z5112 Encounter for antineoplastic immunotherapy: Secondary | ICD-10-CM

## 2013-01-14 LAB — CBC WITH DIFFERENTIAL/PLATELET
Eosinophils Absolute: 0 10*3/uL (ref 0.0–0.5)
HGB: 10.2 g/dL — ABNORMAL LOW (ref 13.0–17.1)
LYMPH%: 25.1 % (ref 14.0–49.0)
MONO#: 0.1 10*3/uL (ref 0.1–0.9)
NEUT#: 2.2 10*3/uL (ref 1.5–6.5)
Platelets: 188 10*3/uL (ref 140–400)
RBC: 2.88 10*6/uL — ABNORMAL LOW (ref 4.20–5.82)
WBC: 3 10*3/uL — ABNORMAL LOW (ref 4.0–10.3)

## 2013-01-14 MED ORDER — SODIUM CHLORIDE 0.9 % IV SOLN
500.0000 mg | Freq: Once | INTRAVENOUS | Status: AC
  Administered 2013-01-14: 500 mg via INTRAVENOUS
  Filled 2013-01-14: qty 25

## 2013-01-14 MED ORDER — ONDANSETRON HCL 8 MG PO TABS
8.0000 mg | ORAL_TABLET | Freq: Once | ORAL | Status: AC
  Administered 2013-01-14: 8 mg via ORAL

## 2013-01-14 MED ORDER — SODIUM CHLORIDE 0.9 % IV SOLN
Freq: Once | INTRAVENOUS | Status: AC
  Administered 2013-01-14: 14:00:00 via INTRAVENOUS

## 2013-01-14 MED ORDER — HEPARIN SOD (PORK) LOCK FLUSH 100 UNIT/ML IV SOLN
500.0000 [IU] | Freq: Once | INTRAVENOUS | Status: AC
  Administered 2013-01-14: 500 [IU] via INTRAVENOUS
  Filled 2013-01-14: qty 5

## 2013-01-14 MED ORDER — BORTEZOMIB CHEMO SQ INJECTION 3.5 MG (2.5MG/ML)
1.3000 mg/m2 | Freq: Once | INTRAMUSCULAR | Status: AC
  Administered 2013-01-14: 2.5 mg via SUBCUTANEOUS
  Filled 2013-01-14: qty 2.5

## 2013-01-14 MED ORDER — SODIUM CHLORIDE 0.9 % IJ SOLN
10.0000 mL | INTRAMUSCULAR | Status: DC | PRN
  Administered 2013-01-14: 10 mL via INTRAVENOUS
  Filled 2013-01-14: qty 10

## 2013-01-14 MED ORDER — DEXAMETHASONE SODIUM PHOSPHATE 20 MG/5ML IJ SOLN
40.0000 mg | Freq: Once | INTRAMUSCULAR | Status: AC
  Administered 2013-01-14: 40 mg via INTRAVENOUS

## 2013-01-14 NOTE — Patient Instructions (Addendum)
[facility] Discharge Instructions for Patients Receiving Chemotherapy  Today you received the following chemotherapy agents Velcade and Cytoxan.  To help prevent nausea and vomiting after your treatment, we encourage you to take your nausea medication as needed.   If you develop nausea and vomiting that is not controlled by your nausea medication, call the clinic.   BELOW ARE SYMPTOMS THAT SHOULD BE REPORTED IMMEDIATELY:  *FEVER GREATER THAN 100.5 F  *CHILLS WITH OR WITHOUT FEVER  NAUSEA AND VOMITING THAT IS NOT CONTROLLED WITH YOUR NAUSEA MEDICATION  *UNUSUAL SHORTNESS OF BREATH  *UNUSUAL BRUISING OR BLEEDING  TENDERNESS IN MOUTH AND THROAT WITH OR WITHOUT PRESENCE OF ULCERS  *URINARY PROBLEMS  *BOWEL PROBLEMS  UNUSUAL RASH Items with * indicate a potential emergency and should be followed up as soon as possible.  Feel free to call the clinic you have any questions or concerns. The clinic phone number is [phone].

## 2013-01-18 ENCOUNTER — Other Ambulatory Visit: Payer: Self-pay | Admitting: Certified Registered Nurse Anesthetist

## 2013-01-20 ENCOUNTER — Encounter (HOSPITAL_COMMUNITY): Payer: Self-pay | Admitting: *Deleted

## 2013-01-20 ENCOUNTER — Emergency Department (HOSPITAL_COMMUNITY)
Admission: EM | Admit: 2013-01-20 | Discharge: 2013-01-20 | Disposition: A | Discharge status: Home / Self Care | Payer: Self-pay | Attending: Emergency Medicine | Admitting: Emergency Medicine

## 2013-01-20 DIAGNOSIS — H00024 Hordeolum internum left upper eyelid: Secondary | ICD-10-CM

## 2013-01-20 DIAGNOSIS — Z85820 Personal history of malignant melanoma of skin: Secondary | ICD-10-CM | POA: Insufficient documentation

## 2013-01-20 DIAGNOSIS — Z8546 Personal history of malignant neoplasm of prostate: Secondary | ICD-10-CM | POA: Insufficient documentation

## 2013-01-20 DIAGNOSIS — Z8739 Personal history of other diseases of the musculoskeletal system and connective tissue: Secondary | ICD-10-CM | POA: Insufficient documentation

## 2013-01-20 DIAGNOSIS — M549 Dorsalgia, unspecified: Secondary | ICD-10-CM | POA: Insufficient documentation

## 2013-01-20 DIAGNOSIS — H5789 Other specified disorders of eye and adnexa: Secondary | ICD-10-CM | POA: Insufficient documentation

## 2013-01-20 DIAGNOSIS — Z862 Personal history of diseases of the blood and blood-forming organs and certain disorders involving the immune mechanism: Secondary | ICD-10-CM | POA: Insufficient documentation

## 2013-01-20 DIAGNOSIS — Z8719 Personal history of other diseases of the digestive system: Secondary | ICD-10-CM | POA: Insufficient documentation

## 2013-01-20 DIAGNOSIS — I10 Essential (primary) hypertension: Secondary | ICD-10-CM | POA: Insufficient documentation

## 2013-01-20 DIAGNOSIS — Z79899 Other long term (current) drug therapy: Secondary | ICD-10-CM | POA: Insufficient documentation

## 2013-01-20 DIAGNOSIS — H00029 Hordeolum internum unspecified eye, unspecified eyelid: Secondary | ICD-10-CM | POA: Insufficient documentation

## 2013-01-20 DIAGNOSIS — Z8701 Personal history of pneumonia (recurrent): Secondary | ICD-10-CM | POA: Insufficient documentation

## 2013-01-20 MED ORDER — ERYTHROMYCIN 5 MG/GM OP OINT
TOPICAL_OINTMENT | Freq: Once | OPHTHALMIC | Status: AC
  Administered 2013-01-20: 09:00:00 via OPHTHALMIC
  Filled 2013-01-20: qty 1

## 2013-01-20 MED ORDER — OXYCODONE HCL ER 10 MG PO T12A: 10.0000 mg | EXTENDED_RELEASE_TABLET | Freq: Two times a day (BID) | ORAL | Status: DC
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
## 2013-01-20 NOTE — ED Notes (Signed)
Pt states that he has been having left eye pain and intermittent swelling for approx 1-2 weeks. Pt states that he has also had some drainage from the eye as well. Pt noted to have reddened area to left eyelid. Pt denies any vision changes.

## 2013-01-20 NOTE — ED Notes (Signed)
Visual acuity completed by PA.  

## 2013-01-21 ENCOUNTER — Ambulatory Visit (HOSPITAL_BASED_OUTPATIENT_CLINIC_OR_DEPARTMENT_OTHER): Payer: No Typology Code available for payment source | Admitting: Lab

## 2013-01-21 ENCOUNTER — Ambulatory Visit (HOSPITAL_BASED_OUTPATIENT_CLINIC_OR_DEPARTMENT_OTHER): Payer: No Typology Code available for payment source

## 2013-01-21 ENCOUNTER — Other Ambulatory Visit: Payer: Self-pay | Admitting: Lab

## 2013-01-21 ENCOUNTER — Encounter: Payer: Self-pay | Admitting: Oncology

## 2013-01-21 VITALS — BP 143/80 | HR 74 | Temp 97.1°F | Resp 20

## 2013-01-21 DIAGNOSIS — C9000 Multiple myeloma not having achieved remission: Secondary | ICD-10-CM

## 2013-01-21 DIAGNOSIS — Z5111 Encounter for antineoplastic chemotherapy: Secondary | ICD-10-CM

## 2013-01-21 DIAGNOSIS — Z5112 Encounter for antineoplastic immunotherapy: Secondary | ICD-10-CM

## 2013-01-21 LAB — CBC WITH DIFFERENTIAL/PLATELET
Eosinophils Absolute: 0 10*3/uL (ref 0.0–0.5)
HCT: 28.8 % — ABNORMAL LOW (ref 38.4–49.9)
LYMPH%: 27.5 % (ref 14.0–49.0)
MCHC: 33.3 g/dL (ref 32.0–36.0)
MONO#: 0.1 10*3/uL (ref 0.1–0.9)
NEUT#: 1.8 10*3/uL (ref 1.5–6.5)
NEUT%: 67.2 % (ref 39.0–75.0)
Platelets: 183 10*3/uL (ref 140–400)
WBC: 2.7 10*3/uL — ABNORMAL LOW (ref 4.0–10.3)

## 2013-01-21 MED ORDER — SODIUM CHLORIDE 0.9 % IV SOLN
500.0000 mg | Freq: Once | INTRAVENOUS | Status: AC
  Administered 2013-01-21: 500 mg via INTRAVENOUS
  Filled 2013-01-21: qty 25

## 2013-01-21 MED ORDER — BORTEZOMIB CHEMO SQ INJECTION 3.5 MG (2.5MG/ML)
1.3000 mg/m2 | Freq: Once | INTRAMUSCULAR | Status: AC
  Administered 2013-01-21: 2.5 mg via SUBCUTANEOUS
  Filled 2013-01-21: qty 2.5

## 2013-01-21 MED ORDER — ONDANSETRON HCL 8 MG PO TABS
8.0000 mg | ORAL_TABLET | Freq: Once | ORAL | Status: AC
  Administered 2013-01-21: 8 mg via ORAL

## 2013-01-21 MED ORDER — SODIUM CHLORIDE 0.9 % IJ SOLN
10.0000 mL | INTRAMUSCULAR | Status: DC | PRN
  Administered 2013-01-21: 10 mL via INTRAVENOUS
  Filled 2013-01-21: qty 10

## 2013-01-21 MED ORDER — SODIUM CHLORIDE 0.9 % IV SOLN
INTRAVENOUS | Status: DC
  Administered 2013-01-21: 09:00:00 via INTRAVENOUS

## 2013-01-21 MED ORDER — DEXAMETHASONE SODIUM PHOSPHATE 20 MG/5ML IJ SOLN
40.0000 mg | Freq: Once | INTRAMUSCULAR | Status: AC
  Administered 2013-01-21: 40 mg via INTRAVENOUS

## 2013-01-21 MED ORDER — HEPARIN SOD (PORK) LOCK FLUSH 100 UNIT/ML IV SOLN
500.0000 [IU] | Freq: Once | INTRAVENOUS | Status: AC
  Administered 2013-01-21: 500 [IU] via INTRAVENOUS
  Filled 2013-01-21: qty 5

## 2013-01-21 NOTE — Progress Notes (Signed)
Called [phone] and can't leave a message. I called [phone] and it is the wrong phone. I have note for Lenise to see the patient at next visit to see if he ever was approved for medicaid. See previous notes to see if the patient followed up with medicaid.

## 2013-01-21 NOTE — Patient Instructions (Signed)
[facility] Discharge Instructions for Patients Receiving Chemotherapy  Today you received the following chemotherapy agents Velcade/Cytoxan  To help prevent nausea and vomiting after your treatment, we encourage you to take your nausea medication as needed   If you develop nausea and vomiting that is not controlled by your nausea medication, call the clinic.   BELOW ARE SYMPTOMS THAT SHOULD BE REPORTED IMMEDIATELY:  *FEVER GREATER THAN 100.5 F  *CHILLS WITH OR WITHOUT FEVER  NAUSEA AND VOMITING THAT IS NOT CONTROLLED WITH YOUR NAUSEA MEDICATION  *UNUSUAL SHORTNESS OF BREATH  *UNUSUAL BRUISING OR BLEEDING  TENDERNESS IN MOUTH AND THROAT WITH OR WITHOUT PRESENCE OF ULCERS  *URINARY PROBLEMS  *BOWEL PROBLEMS  UNUSUAL RASH Items with * indicate a potential emergency and should be followed up as soon as possible.  Feel free to call the clinic you have any questions or concerns. The clinic phone number is [phone].

## 2013-01-24 NOTE — ED Provider Notes (Signed)
Medical screening examination/treatment/procedure(s) were performed by non-physician practitioner and as supervising physician I was immediately available for consultation/collaboration.  Shandra Szymborski T Saveon Plant, MD 01/24/13 0836 

## 2013-01-26 ENCOUNTER — Encounter (HOSPITAL_COMMUNITY): Payer: Self-pay | Admitting: Family Medicine

## 2013-01-26 ENCOUNTER — Emergency Department (HOSPITAL_COMMUNITY)
Admission: EM | Admit: 2013-01-26 | Discharge: 2013-01-26 | Disposition: A | Discharge status: Home / Self Care | Payer: Self-pay | Attending: Emergency Medicine | Admitting: Emergency Medicine

## 2013-01-26 DIAGNOSIS — I10 Essential (primary) hypertension: Secondary | ICD-10-CM | POA: Insufficient documentation

## 2013-01-26 DIAGNOSIS — Z79899 Other long term (current) drug therapy: Secondary | ICD-10-CM | POA: Insufficient documentation

## 2013-01-26 DIAGNOSIS — Z8719 Personal history of other diseases of the digestive system: Secondary | ICD-10-CM | POA: Insufficient documentation

## 2013-01-26 DIAGNOSIS — Z8701 Personal history of pneumonia (recurrent): Secondary | ICD-10-CM | POA: Insufficient documentation

## 2013-01-26 DIAGNOSIS — M5137 Other intervertebral disc degeneration, lumbosacral region: Secondary | ICD-10-CM | POA: Insufficient documentation

## 2013-01-26 DIAGNOSIS — H00016 Hordeolum externum left eye, unspecified eyelid: Secondary | ICD-10-CM

## 2013-01-26 DIAGNOSIS — Z85820 Personal history of malignant melanoma of skin: Secondary | ICD-10-CM | POA: Insufficient documentation

## 2013-01-26 DIAGNOSIS — M51379 Other intervertebral disc degeneration, lumbosacral region without mention of lumbar back pain or lower extremity pain: Secondary | ICD-10-CM | POA: Insufficient documentation

## 2013-01-26 DIAGNOSIS — Z862 Personal history of diseases of the blood and blood-forming organs and certain disorders involving the immune mechanism: Secondary | ICD-10-CM | POA: Insufficient documentation

## 2013-01-26 DIAGNOSIS — Z8546 Personal history of malignant neoplasm of prostate: Secondary | ICD-10-CM | POA: Insufficient documentation

## 2013-01-26 DIAGNOSIS — H00019 Hordeolum externum unspecified eye, unspecified eyelid: Secondary | ICD-10-CM | POA: Insufficient documentation
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
## 2013-01-26 NOTE — ED Notes (Signed)
PT ambulated with baseline gait; VSS; A&Ox3; no signs of distress; respirations even and unlabored; skin warm and dry; no questions upon discharge.  

## 2013-01-26 NOTE — ED Notes (Signed)
Per pt recheck left eye. sts abscess but has been using medication and not better.

## 2013-01-28 ENCOUNTER — Other Ambulatory Visit: Payer: Self-pay | Admitting: Lab

## 2013-01-28 ENCOUNTER — Ambulatory Visit (HOSPITAL_BASED_OUTPATIENT_CLINIC_OR_DEPARTMENT_OTHER): Payer: No Typology Code available for payment source

## 2013-01-28 ENCOUNTER — Other Ambulatory Visit: Payer: Self-pay | Admitting: Medical Oncology

## 2013-01-28 ENCOUNTER — Telehealth: Payer: Self-pay | Admitting: Hematology and Oncology

## 2013-01-28 ENCOUNTER — Ambulatory Visit (HOSPITAL_BASED_OUTPATIENT_CLINIC_OR_DEPARTMENT_OTHER): Payer: No Typology Code available for payment source | Admitting: Hematology and Oncology

## 2013-01-28 ENCOUNTER — Encounter: Payer: Self-pay | Admitting: Hematology and Oncology

## 2013-01-28 VITALS — BP 127/78 | HR 74 | Temp 97.0°F | Resp 18 | Ht 73.5 in | Wt 162.5 lb

## 2013-01-28 DIAGNOSIS — C9000 Multiple myeloma not having achieved remission: Secondary | ICD-10-CM

## 2013-01-28 DIAGNOSIS — Z5111 Encounter for antineoplastic chemotherapy: Secondary | ICD-10-CM

## 2013-01-28 DIAGNOSIS — M545 Low back pain, unspecified: Secondary | ICD-10-CM

## 2013-01-28 DIAGNOSIS — D649 Anemia, unspecified: Secondary | ICD-10-CM

## 2013-01-28 DIAGNOSIS — C61 Malignant neoplasm of prostate: Secondary | ICD-10-CM

## 2013-01-28 DIAGNOSIS — R972 Elevated prostate specific antigen [PSA]: Secondary | ICD-10-CM

## 2013-01-28 DIAGNOSIS — Z5112 Encounter for antineoplastic immunotherapy: Secondary | ICD-10-CM

## 2013-01-28 LAB — CBC WITH DIFFERENTIAL/PLATELET
BASO%: 0.4 % (ref 0.0–2.0)
EOS%: 0.4 % (ref 0.0–7.0)
LYMPH%: 33.5 % (ref 14.0–49.0)
MCHC: 33.9 g/dL (ref 32.0–36.0)
MCV: 99.7 fL — ABNORMAL HIGH (ref 79.3–98.0)
MONO%: 4.8 % (ref 0.0–14.0)
Platelets: 188 10*3/uL (ref 140–400)
RBC: 3.02 10*6/uL — ABNORMAL LOW (ref 4.20–5.82)
nRBC: 0 % (ref 0–0)

## 2013-01-28 LAB — COMPREHENSIVE METABOLIC PANEL (CC13)
AST: 21 U/L (ref 5–34)
Alkaline Phosphatase: 70 U/L (ref 40–150)
BUN: 19.2 mg/dL (ref 7.0–26.0)
Calcium: 9 mg/dL (ref 8.4–10.4)
Potassium: 4.1 mEq/L (ref 3.5–5.1)

## 2013-01-28 MED ORDER — BORTEZOMIB CHEMO SQ INJECTION 3.5 MG (2.5MG/ML)
1.3000 mg/m2 | Freq: Once | INTRAMUSCULAR | Status: AC
  Administered 2013-01-28: 2.5 mg via SUBCUTANEOUS
  Filled 2013-01-28: qty 2.5

## 2013-01-28 MED ORDER — SODIUM CHLORIDE 0.9 % IV SOLN
500.0000 mg | Freq: Once | INTRAVENOUS | Status: AC
  Administered 2013-01-28: 500 mg via INTRAVENOUS
  Filled 2013-01-28: qty 25

## 2013-01-28 MED ORDER — ONDANSETRON HCL 8 MG PO TABS
8.0000 mg | ORAL_TABLET | Freq: Once | ORAL | Status: AC
  Administered 2013-01-28: 8 mg via ORAL

## 2013-01-28 MED ORDER — SODIUM CHLORIDE 0.9 % IV SOLN
Freq: Once | INTRAVENOUS | Status: AC
  Administered 2013-01-28: 10:00:00 via INTRAVENOUS

## 2013-01-28 MED ORDER — DEXAMETHASONE SODIUM PHOSPHATE 20 MG/5ML IJ SOLN
40.0000 mg | Freq: Once | INTRAMUSCULAR | Status: AC
  Administered 2013-01-28: 40 mg via INTRAVENOUS

## 2013-01-28 NOTE — Progress Notes (Signed)
CC:   [name], M.D. [name], MD  PROBLEM LIST:  1. 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 IgG level on 08/17/2012 was 4760. Kappa light chains were 21.90. Beta 2 microglobulin 1.99. Albumin 3.4. Total protein 10.1 and globulin 6.7. 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 Stage by ISS for multiple myeloma is stage II. Metastatic bone survey carried out on 08/28/2012 was negative for evidence of multiple myeloma. A 24-hour urine protein collected on 09/16/2012 was 27 mg.  Urine immunofixation electrophoresis showed IgG heavy chains with  associated kappa light chains and excess monoclonal free kappa light  chains. Treatment with Velcade, Cytoxan and Decadron was started on 09/24/2012.  It will be recalled that this patient was diagnosed with  indolent/smoldering multiple myeloma, IgG kappa, when a bone marrow  carried out on 10/17/2009 showed 11% plasma cells. 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 IgG level on 11/30/2009 was 2870.  2. Leukopenia and neutropenia.  3. Anemia.  4. Adenocarcinoma of the prostate diagnosed on 12 core needle biopsies  carried out on 11/05/2012. Gleason score was 3+3=6. Three out of the 12  cores were involved. Core involvement was less than 5%-40%. Clinical  stage is T1C. PSA was 16.7.  5. Hypertension.  6. Hemangiomas of the liver seen on ultrasound carried out on  07/31/2009.  7. Lower back pain with radiation into the right leg.  Degenerative disk disease and spinal stenosis seen on an MRI of the  lumbar spine with and without IV contrast on 08/28/2012. There was no  evidence for myeloma or cancer. No pathologic fracture.  8. Status post laparoscopic cholecystectomy carried out in 2002.  9. Depression.  10. Systolic ejection murmur.  11. Financial and  medical insurance difficulties.  12. Elevated liver function tests in mid and late May 2014 secondary to excessive acetaminophen usage.  After the patient stopped taking acetaminophen, transaminases had returned to normal. 13. Right-sided IJ Port-A-Cath placed on 11/02/2012 by Dr. Jolaine Click from  Interventional Radiology.   MEDICATIONS:  Reviewed and recorded. Current Outpatient Prescriptions  Medication Sig Dispense Refill  . lidocaine-prilocaine (EMLA) cream Apply topically as needed. Apply to port site one hour before chemo and cover with plastic wrap.  30 g  3  . losartan (COZAAR) 100 MG tablet Take 100 mg by mouth every morning.       . metoCLOPramide (REGLAN) 10 MG tablet Take 1 tablet (10 mg total) by mouth every 6 (six) hours as needed (nausea/headache).  15 tablet  0  . OxyCODONE (OXYCONTIN) 10 mg T12A Take 1 tablet (10 mg total) by mouth every 12 (twelve) hours.  5 tablet  0  . PRESCRIPTION MEDICATION 1 Units by Other route every 7 (seven) days. Chemo (Velcade SQ and Cytoxan IV) once a week on friday       No current facility-administered medications for this visit.    TREATMENT PROGRAM:  1. Velcade 1.3 mg/m2, equal to 2.5 mg subcu weekly.  2. Cytoxan 300 mg per meter squared equal to 500 mg IV weekly.  3. Decadron 40 mg IV weekly.  Chemotherapy was started on 09/24/2012.    IMMUNIZATIONS:  Pneumovax was given on 10/22/2012.  Flu shot was given in December 2013.    SMOKING HISTORY: The patient smoked up to 2 packs of cigarettes a day  for 5 years. He has not smoked since his mid 63s.    HISTORY:  We saw [name] today for followup of his IgG kappa multiple myeloma, currently undergoing weekly treatments with Velcade, Cytoxan, and Decadron.  Treatments were started on 09/24/2012.  Journey was last seen by [name] on 11/26/2012.  He is here today by himself. As stated, he has continued with weekly treatments which he has tolerated well.  Because of a scheduling  error, Koltan did not receive chemotherapy on the week of 12/25/2012.  Shortly after his visit here on 11/26/2012, we noted that Gabrien's transaminases were elevated.  On 12/04/2012, AST was 118, ALT 233.  We discovered that Hasheem was taking a lot of Tylenol and urged him to stop taking Tylenol.  With that, his transaminases have returned to normal as of today.  Off note, Dr. Gerarda Fraction Murinson, have spoken with Dr. Barron Alvine  about Audrey's prostate cancer. Plans for a robotic prostatectomy were put on hold.  I believe that management of Curtez's multiple myeloma needs to take priority for the moment over any surgery or radiation treatments for his prostate cancer. Either the prostate cancer needs to be under continued observation, at least for the time being, or perhaps Kiwan may benefit from some hormonal intervention as a temporizing measure until a definitive treatment strategy for the prostate cancer can be put into effect.  For now, Cornelious is continuing with his weekly chemotherapy, and we have plans to refer him to Dr. Barbaraann Boys at [facility] for consideration of high- dose chemotherapy and autologous stem cell rescue.  Cleburn's main problem continues to be low back pain with radiation into his right leg.  This has been a chronic problem.  It will be recalled that imaging evaluation with an MRI of the lumbar spine carried out on 08/28/2012 showed no evidence for multiple myeloma or for prostate cancer.  There was a degenerative change with a mild L4-L5 central stenosis and bilateral lateral recess stenosis which may be affecting the descending L5 nerves.  Apparently Sahil had a couple of appointments scheduled with an orthopedic surgeon for evaluation and possible steroid injections, but apparently those appointments were not Kept.He denies any dysuria or hematuria.    Aside from these issues, Brave seems to be doing generally well.  He is now on OxyContin 20 mg  twice daily.  He is not taking any Tylenol, nor is he taking any of the short-acting Oxy-IR that we had prescribed for him for breakthrough pain.  Leticia is scheduled for his weekly chemotherapy today.   Blood pressure 127/78, pulse 74, temperature 97 F (36.1 C), temperature source Oral, resp. rate 18, height 6' 1.5" (1.867 m), weight 162 lb 8 oz (73.71 kg).  PHYSICAL EXAMINATION:  General:  On physical exam Haider looks well. He does not appear to be in pain at this time.  Vital Signs:  Weight is 162 pounds 8 ounces, height 6 feet 1-1/2 inches, body surface area 1.95 meters squared.   HEENT:  There is no scleral icterus.  Mouth and pharynx are benign.  Lymph:  No peripheral adenopathy palpable.  Lungs:  Clear to percussion and auscultation.  Cardiac:  Regular rhythm with systolic ejection murmur, more obvious when the patient is lying down.  Abdomen: Benign, with no organomegaly or masses palpable.  Extremities:  No peripheral edema or clubbing.  No localizing tenderness.  Porter has excellent strength in both legs.  Lines:  There is a right-sided Port-A- Cath covered with  EMLA cream.  LABORATORY DATA:    CBC    Component Value Date/Time   WBC 2.5* 01/28/2013 0752   WBC 2.8* 12/21/2012 1345   RBC 3.02* 01/28/2013 0752   RBC 2.96* 12/21/2012 1345   HGB 10.2* 01/28/2013 0752   HGB 10.0* 12/21/2012 1345   HCT 30.1* 01/28/2013 0752   HCT 29.1* 12/21/2012 1345   PLT 188 01/28/2013 0752   PLT 175 12/21/2012 1345   MCV 99.7* 01/28/2013 0752   MCV 98.3 12/21/2012 1345   MCH 33.8* 01/28/2013 0752   MCH 33.8 12/21/2012 1345   MCHC 33.9 01/28/2013 0752   MCHC 34.4 12/21/2012 1345   RDW 14.1 01/28/2013 0752   RDW 13.9 12/21/2012 1345   LYMPHSABS 0.8* 01/28/2013 0752   LYMPHSABS 0.2* 10/23/2012 1350   MONOABS 0.1 01/28/2013 0752   MONOABS 0.1 10/23/2012 1350   EOSABS 0.0 01/28/2013 0752   EOSABS 0.0 10/23/2012 1350   BASOSABS 0.0 01/28/2013 0752   BASOSABS 0.0 10/23/2012 1350    Lab Results  Component  Value Date   GLUCOSE 106* 01/01/2013   BUN 9.2 01/01/2013   CO2 24 01/01/2013   ALT 20 01/01/2013   AST 18 01/01/2013   LDH 159 01/01/2013   K 4.2 01/01/2013   CREATININE 0.8 01/01/2013      IMAGING STUDIES:  1. Abdominal ultrasound carried out on 07/31/2009 showed  a hyperechoic lesion in the right lobe of the liver which basically had  not changed compared with the study carried out on 08/09/2006, felt to  be most consistent with a hemangioma. The patient was status post  cholecystectomy. Spleen was normal.  2. Metastatic bone survey on 08/08/2009 showed no lytic bone lesions.  3. Lumbar spine complete 4 views from 10/14/2011 showed L5-S1 and to a  lesser extent L4-L5 degenerative disk space narrowing. Otherwise, a  negative study.  4. Sacrum and coccyx 2 views showed moderate bilateral hip joint  degenerative changes. There were femoral head subchondral cystic  changes and sclerosis, greater on the right. There were prominent  degenerative changes at L5-S1.  5. Chest x-ray 2 view from 08/03/2012 showed no active cardiopulmonary  Disease.  6. Metastatic bone survey from 08/28/2012 was negative for evidence of  multiple myeloma or any destructive lesions.  7. MRI of the lumbar spine with and without IV contrast from 08/28/2012  showed no evidence for multiple myeloma. There were no pathologic  fractures. There was heterogeneous marrow signal. There was  degenerative disk disease at L4-L5 and L5-S1. There was mild L4-L5  central stenosis and bilateral lateral recess stenosis which potentially  affects the descending L5 nerves. The L5-S1 left lateral recess  encroachment potentially affects the descending left S1 nerve.  8. Nuclear medicine whole body bone scan from 08/28/2012 showed activity  within multiple joints, most likely arthritic or degenerative. There  was no pattern to suggest skeletal metastatic disease.  9. Right internal jugular vein Port-A-Cath was placed on 11/02/2012  by Dr.  Jolaine Click from Interventional Radiology.    IMPRESSION AND PLAN:   This is my first time to see Mr. Rayan, and looking at his myeloma marker it looks like it started to slowly increase, I don't have the full profile yet but IgG and kappa Lt chains are slightly higer than last visit. Thus we will plan to switch the cytoxan with Revelamid, but until we get the revelamid in hand will continue current therapy. Also We will go ahead and make a referral for Gerlene Burdock  to see Dr. Barbaraann Boys in preparation for high-dose melphalan and autologous stem cell rescue. multiple myeloma.   We will proceed with the same doses as previously, specifically, Velcade 2.5 mg subcutaneous, Cytoxan 500 mg IV, Decadron 40 mg IV.  Floyde will continue to have weekly CBCs with each chemotherapy.  We will plan to see Keyron again on 3-4 weeks, at which time he will have a CBC, chemistries, quantitative immunoglobulins, serum protein electrophoresis, serum immunofixation electrophoresis, serum light chains.    Zachery Dakins, MD 01/28/2013 9:07 AM

## 2013-01-28 NOTE — Telephone Encounter (Signed)
S/w pt wife re next appt for 7/28 @ 845am. Pt will get new schedule when he comes in.

## 2013-01-28 NOTE — Progress Notes (Signed)
I called DSS to check eligibility.  Pt was denied because he failed to turn in requested info so he has to reapply for Medicaid.  I relayed that info to pt and gave him another Medicaid application.

## 2013-01-28 NOTE — Patient Instructions (Signed)
[facility] Discharge Instructions for Patients Receiving Chemotherapy  Today you received the following chemotherapy agents Cytoxan/Velcade To help prevent nausea and vomiting after your treatment, we encourage you to take your nausea medication as prescribed.  If you develop nausea and vomiting that is not controlled by your nausea medication, call the clinic.   BELOW ARE SYMPTOMS THAT SHOULD BE REPORTED IMMEDIATELY:  *FEVER GREATER THAN 100.5 F  *CHILLS WITH OR WITHOUT FEVER  NAUSEA AND VOMITING THAT IS NOT CONTROLLED WITH YOUR NAUSEA MEDICATION  *UNUSUAL SHORTNESS OF BREATH  *UNUSUAL BRUISING OR BLEEDING  TENDERNESS IN MOUTH AND THROAT WITH OR WITHOUT PRESENCE OF ULCERS  *URINARY PROBLEMS  *BOWEL PROBLEMS  UNUSUAL RASH Items with * indicate a potential emergency and should be followed up as soon as possible.  Feel free to call the clinic you have any questions or concerns. The clinic phone number is [phone].    

## 2013-01-28 NOTE — ED Provider Notes (Signed)
Medical screening examination/treatment/procedure(s) were performed by non-physician practitioner and as supervising physician I was immediately available for consultation/collaboration.  Tabytha Gradillas T Elizette Shek, MD 01/28/13 1646 

## 2013-02-01 ENCOUNTER — Encounter: Payer: Self-pay | Admitting: Medical Oncology

## 2013-02-01 ENCOUNTER — Telehealth: Payer: Self-pay | Admitting: *Deleted

## 2013-02-01 ENCOUNTER — Telehealth: Payer: Self-pay | Admitting: Medical Oncology

## 2013-02-01 LAB — SPEP & IFE WITH QIG
Albumin ELP: 46.5 % — ABNORMAL LOW (ref 55.8–66.1)
Alpha-2-Globulin: 8 % (ref 7.1–11.8)
Beta Globulin: 5.3 % (ref 4.7–7.2)
Total Protein, Serum Electrophoresis: 10 g/dL — ABNORMAL HIGH (ref 6.0–8.3)

## 2013-02-01 LAB — KAPPA/LAMBDA LIGHT CHAINS
Kappa free light chain: 7.89 mg/dL — ABNORMAL HIGH (ref 0.33–1.94)
Lambda Free Lght Chn: 0.25 mg/dL — ABNORMAL LOW (ref 0.57–2.63)

## 2013-02-01 NOTE — Progress Notes (Signed)
Pt came by the office requesting a note for his job. While pt was here I discussed the revlimid enrollment form. Pt voiced understanding and he signed the forms. I gave him information regarding revlimid and why Dr. Karel Jarvis is adding to his treatment plan.

## 2013-02-01 NOTE — Telephone Encounter (Signed)
DR.SALEM'S NURSE, ROBIN BASS,RN HAS REVLIMID PAPERS FOR PT. TO SIGN. SHE WILL ALSO CHECK ON THE NOTE FOR PT.'S EMPLOYER.

## 2013-02-04 ENCOUNTER — Ambulatory Visit (HOSPITAL_BASED_OUTPATIENT_CLINIC_OR_DEPARTMENT_OTHER): Payer: No Typology Code available for payment source

## 2013-02-04 ENCOUNTER — Encounter: Payer: Self-pay | Admitting: Oncology

## 2013-02-04 ENCOUNTER — Other Ambulatory Visit (HOSPITAL_BASED_OUTPATIENT_CLINIC_OR_DEPARTMENT_OTHER): Payer: No Typology Code available for payment source | Admitting: Lab

## 2013-02-04 VITALS — BP 135/79 | HR 74 | Temp 98.3°F | Resp 18

## 2013-02-04 DIAGNOSIS — C9000 Multiple myeloma not having achieved remission: Secondary | ICD-10-CM

## 2013-02-04 DIAGNOSIS — Z5112 Encounter for antineoplastic immunotherapy: Secondary | ICD-10-CM

## 2013-02-04 LAB — CBC WITH DIFFERENTIAL/PLATELET
BASO%: 0 % (ref 0.0–2.0)
HCT: 29.6 % — ABNORMAL LOW (ref 38.4–49.9)
LYMPH%: 27.8 % (ref 14.0–49.0)
MCH: 34 pg — ABNORMAL HIGH (ref 27.2–33.4)
MCHC: 33.4 g/dL (ref 32.0–36.0)
MONO#: 0.1 10*3/uL (ref 0.1–0.9)
NEUT%: 68.2 % (ref 39.0–75.0)
Platelets: 148 10*3/uL (ref 140–400)
WBC: 2.5 10*3/uL — ABNORMAL LOW (ref 4.0–10.3)

## 2013-02-04 MED ORDER — DEXAMETHASONE SODIUM PHOSPHATE 20 MG/5ML IJ SOLN
40.0000 mg | Freq: Once | INTRAMUSCULAR | Status: AC
  Administered 2013-02-04: 40 mg via INTRAVENOUS

## 2013-02-04 MED ORDER — ONDANSETRON HCL 8 MG PO TABS
8.0000 mg | ORAL_TABLET | Freq: Once | ORAL | Status: AC
  Administered 2013-02-04: 8 mg via ORAL

## 2013-02-04 MED ORDER — SODIUM CHLORIDE 0.9 % IV SOLN
INTRAVENOUS | Status: DC
  Administered 2013-02-04: 09:00:00 via INTRAVENOUS

## 2013-02-04 MED ORDER — BORTEZOMIB CHEMO SQ INJECTION 3.5 MG (2.5MG/ML)
1.3000 mg/m2 | Freq: Once | INTRAMUSCULAR | Status: AC
  Administered 2013-02-04: 2.5 mg via SUBCUTANEOUS
  Filled 2013-02-04: qty 2.5

## 2013-02-04 MED ORDER — SODIUM CHLORIDE 0.9 % IV SOLN
500.0000 mg | Freq: Once | INTRAVENOUS | Status: AC
  Administered 2013-02-04: 500 mg via INTRAVENOUS
  Filled 2013-02-04: qty 25

## 2013-02-04 NOTE — Telephone Encounter (Signed)
Pt called asking for note for work. I asked him to come in and pick it up.

## 2013-02-04 NOTE — Progress Notes (Signed)
WBC 2.5 Dr. Karel Jarvis notified. OK to treat today, per MD. Clayborn Heron, RN

## 2013-02-04 NOTE — Progress Notes (Signed)
Submitted revlimid patient assistance application to [employer] @ [facility].

## 2013-02-04 NOTE — Patient Instructions (Addendum)
[facility] Discharge Instructions for Patients Receiving Chemotherapy  Today you received the following chemotherapy agents: cytoxan, velcade  To help prevent nausea and vomiting after your treatment, we encourage you to take your nausea medication as directed by your doctor.   If you develop nausea and vomiting that is not controlled by your nausea medication, call the clinic.   BELOW ARE SYMPTOMS THAT SHOULD BE REPORTED IMMEDIATELY:  *FEVER GREATER THAN 100.5 F  *CHILLS WITH OR WITHOUT FEVER  NAUSEA AND VOMITING THAT IS NOT CONTROLLED WITH YOUR NAUSEA MEDICATION  *UNUSUAL SHORTNESS OF BREATH  *UNUSUAL BRUISING OR BLEEDING  TENDERNESS IN MOUTH AND THROAT WITH OR WITHOUT PRESENCE OF ULCERS  *URINARY PROBLEMS  *BOWEL PROBLEMS  UNUSUAL RASH Items with * indicate a potential emergency and should be followed up as soon as possible.  Feel free to call the clinic you have any questions or concerns. The clinic phone number is [phone].

## 2013-02-09 ENCOUNTER — Encounter: Payer: Self-pay | Admitting: Oncology

## 2013-02-09 NOTE — Progress Notes (Signed)
Travis Nguyen ([facility]) informed me that the patient is approved for a free 30 day supply of revlimid.  She informed him, and the Artist informed him, that he has to apply for medicaid; if he is denied I need to send her a copy of the denial letter in order for him to continue receiving free revlimid for 6 months. Faxed prescription to [facility]'s pharmacy @ [phone].

## 2013-02-11 ENCOUNTER — Other Ambulatory Visit (HOSPITAL_BASED_OUTPATIENT_CLINIC_OR_DEPARTMENT_OTHER): Payer: No Typology Code available for payment source | Admitting: Lab

## 2013-02-11 ENCOUNTER — Ambulatory Visit (HOSPITAL_BASED_OUTPATIENT_CLINIC_OR_DEPARTMENT_OTHER): Payer: No Typology Code available for payment source

## 2013-02-11 ENCOUNTER — Telehealth: Payer: Self-pay | Admitting: *Deleted

## 2013-02-11 ENCOUNTER — Telehealth: Payer: Self-pay

## 2013-02-11 ENCOUNTER — Other Ambulatory Visit: Payer: Self-pay | Admitting: Hematology and Oncology

## 2013-02-11 VITALS — BP 127/76 | HR 84 | Temp 98.5°F | Resp 18

## 2013-02-11 DIAGNOSIS — C9000 Multiple myeloma not having achieved remission: Secondary | ICD-10-CM

## 2013-02-11 DIAGNOSIS — Z5112 Encounter for antineoplastic immunotherapy: Secondary | ICD-10-CM

## 2013-02-11 LAB — CBC WITH DIFFERENTIAL/PLATELET
BASO%: 1.2 % (ref 0.0–2.0)
EOS%: 0.4 % (ref 0.0–7.0)
Eosinophils Absolute: 0 10*3/uL (ref 0.0–0.5)
LYMPH%: 27.3 % (ref 14.0–49.0)
MCH: 33.8 pg — ABNORMAL HIGH (ref 27.2–33.4)
MCHC: 33.4 g/dL (ref 32.0–36.0)
MCV: 101 fL — ABNORMAL HIGH (ref 79.3–98.0)
MONO%: 4 % (ref 0.0–14.0)
Platelets: 164 10*3/uL (ref 140–400)
RBC: 2.96 10*6/uL — ABNORMAL LOW (ref 4.20–5.82)
RDW: 14.6 % (ref 11.0–14.6)
nRBC: 0 % (ref 0–0)

## 2013-02-11 LAB — COMPREHENSIVE METABOLIC PANEL (CC13)
ALT: 205 U/L — ABNORMAL HIGH (ref 0–55)
Alkaline Phosphatase: 72 U/L (ref 40–150)
CO2: 23 mEq/L (ref 22–29)
Creatinine: 1.2 mg/dL (ref 0.7–1.3)
Sodium: 135 mEq/L — ABNORMAL LOW (ref 136–145)
Total Bilirubin: 0.24 mg/dL (ref 0.20–1.20)

## 2013-02-11 MED ORDER — SODIUM CHLORIDE 0.9 % IV SOLN
Freq: Once | INTRAVENOUS | Status: AC
  Administered 2013-02-11: 09:00:00 via INTRAVENOUS

## 2013-02-11 MED ORDER — DEXAMETHASONE SODIUM PHOSPHATE 20 MG/5ML IJ SOLN
40.0000 mg | Freq: Once | INTRAMUSCULAR | Status: AC
  Administered 2013-02-11: 40 mg via INTRAVENOUS

## 2013-02-11 MED ORDER — SODIUM CHLORIDE 0.9 % IV SOLN
500.0000 mg | Freq: Once | INTRAVENOUS | Status: AC
  Administered 2013-02-11: 500 mg via INTRAVENOUS
  Filled 2013-02-11: qty 25

## 2013-02-11 MED ORDER — SODIUM CHLORIDE 0.9 % IJ SOLN
10.0000 mL | INTRAMUSCULAR | Status: DC | PRN
  Administered 2013-02-11: 10 mL via INTRAVENOUS
  Filled 2013-02-11: qty 10

## 2013-02-11 MED ORDER — HEPARIN SOD (PORK) LOCK FLUSH 100 UNIT/ML IV SOLN
500.0000 [IU] | Freq: Once | INTRAVENOUS | Status: AC
  Administered 2013-02-11: 500 [IU] via INTRAVENOUS
  Filled 2013-02-11: qty 5

## 2013-02-11 MED ORDER — BORTEZOMIB CHEMO SQ INJECTION 3.5 MG (2.5MG/ML)
1.3000 mg/m2 | Freq: Once | INTRAMUSCULAR | Status: AC
  Administered 2013-02-11: 2.5 mg via SUBCUTANEOUS
  Filled 2013-02-11: qty 2.5

## 2013-02-11 MED ORDER — ONDANSETRON HCL 8 MG PO TABS
8.0000 mg | ORAL_TABLET | Freq: Once | ORAL | Status: AC
  Administered 2013-02-11: 8 mg via ORAL

## 2013-02-11 NOTE — Telephone Encounter (Signed)
[facility] sent facsimile confirmation of Revlimid prescription shipment.  Revlimid was shipped on 02/10/2013 with next business day delivery.  Noted Managed Care note that patient was approved for a free thirty day supply of Revlimid from the patient assistance foundation.  Applying for Medicaid per financial counselor.

## 2013-02-11 NOTE — Telephone Encounter (Signed)
Pt called to let us know he received revilimid shipment today. He is not going to take any until his next appt 8/7 with Dr Karel Jarvis.

## 2013-02-11 NOTE — Progress Notes (Signed)
Per patient, supply of revlimid due to be delivered to him today. Patient has been instructed to contact Dr. Sharion Dove RN when the supply arrives but not to take any of the medication until he sees the provider on 02/18/13. This is Dr. Sharion Dove plan and the patient verbalizes understanding of these instructions. Dr. Karel Jarvis aware of WBC today; OK to treat today, per MD. Clayborn Heron, RN

## 2013-02-11 NOTE — Patient Instructions (Addendum)
[facility] Discharge Instructions for Patients Receiving Chemotherapy  Today you received the following chemotherapy agents: Velcade, Cytoxan  To help prevent nausea and vomiting after your treatment, we encourage you to take your nausea medication as directed by your doctor.   Please call Dr. Sharion Dove nurse when you receive the supply of Revlimid in the mail. However, do not begin taking the medication until you see your doctor next week. Please see your schedule for that appointment.    If you develop nausea and vomiting that is not controlled by your nausea medication, call the clinic.   BELOW ARE SYMPTOMS THAT SHOULD BE REPORTED IMMEDIATELY:  *FEVER GREATER THAN 100.5 F  *CHILLS WITH OR WITHOUT FEVER  NAUSEA AND VOMITING THAT IS NOT CONTROLLED WITH YOUR NAUSEA MEDICATION  *UNUSUAL SHORTNESS OF BREATH  *UNUSUAL BRUISING OR BLEEDING  TENDERNESS IN MOUTH AND THROAT WITH OR WITHOUT PRESENCE OF ULCERS  *URINARY PROBLEMS  *BOWEL PROBLEMS  UNUSUAL RASH Items with * indicate a potential emergency and should be followed up as soon as possible.  Feel free to call the clinic you have any questions or concerns. The clinic phone number is [phone].

## 2013-02-17 ENCOUNTER — Other Ambulatory Visit: Payer: Self-pay

## 2013-02-17 DIAGNOSIS — C9000 Multiple myeloma not having achieved remission: Secondary | ICD-10-CM

## 2013-02-18 ENCOUNTER — Telehealth: Payer: Self-pay | Admitting: Hematology and Oncology

## 2013-02-18 ENCOUNTER — Ambulatory Visit (HOSPITAL_BASED_OUTPATIENT_CLINIC_OR_DEPARTMENT_OTHER): Payer: Medicaid Other | Admitting: Hematology and Oncology

## 2013-02-18 ENCOUNTER — Other Ambulatory Visit (HOSPITAL_BASED_OUTPATIENT_CLINIC_OR_DEPARTMENT_OTHER): Payer: No Typology Code available for payment source | Admitting: Lab

## 2013-02-18 ENCOUNTER — Other Ambulatory Visit: Payer: Self-pay | Admitting: Medical Oncology

## 2013-02-18 ENCOUNTER — Ambulatory Visit (HOSPITAL_BASED_OUTPATIENT_CLINIC_OR_DEPARTMENT_OTHER): Payer: No Typology Code available for payment source

## 2013-02-18 VITALS — BP 129/71 | HR 80 | Temp 98.1°F | Resp 18 | Ht 73.5 in | Wt 172.4 lb

## 2013-02-18 DIAGNOSIS — C61 Malignant neoplasm of prostate: Secondary | ICD-10-CM

## 2013-02-18 DIAGNOSIS — Z5111 Encounter for antineoplastic chemotherapy: Secondary | ICD-10-CM

## 2013-02-18 DIAGNOSIS — C9000 Multiple myeloma not having achieved remission: Secondary | ICD-10-CM

## 2013-02-18 DIAGNOSIS — Z5112 Encounter for antineoplastic immunotherapy: Secondary | ICD-10-CM

## 2013-02-18 LAB — CBC WITH DIFFERENTIAL/PLATELET
BASO%: 0.4 % (ref 0.0–2.0)
Basophils Absolute: 0 10*3/uL (ref 0.0–0.1)
EOS%: 0.4 % (ref 0.0–7.0)
Eosinophils Absolute: 0 10*3/uL (ref 0.0–0.5)
HCT: 30.4 % — ABNORMAL LOW (ref 38.4–49.9)
HGB: 10.2 g/dL — ABNORMAL LOW (ref 13.0–17.1)
LYMPH%: 33.9 % (ref 14.0–49.0)
MCH: 33.4 pg (ref 27.2–33.4)
MCHC: 33.6 g/dL (ref 32.0–36.0)
MCV: 99.7 fL — ABNORMAL HIGH (ref 79.3–98.0)
MONO#: 0.1 10*3/uL (ref 0.1–0.9)
MONO%: 5.2 % (ref 0.0–14.0)
NEUT#: 1.4 10*3/uL — ABNORMAL LOW (ref 1.5–6.5)
NEUT%: 60.1 % (ref 39.0–75.0)
Platelets: 193 10*3/uL (ref 140–400)
RBC: 3.05 10*6/uL — ABNORMAL LOW (ref 4.20–5.82)
RDW: 14.5 % (ref 11.0–14.6)
WBC: 2.3 10*3/uL — ABNORMAL LOW (ref 4.0–10.3)
lymph#: 0.8 10*3/uL — ABNORMAL LOW (ref 0.9–3.3)
nRBC: 0 % (ref 0–0)

## 2013-02-18 LAB — COMPREHENSIVE METABOLIC PANEL (CC13)
ALT: 188 U/L — ABNORMAL HIGH (ref 0–55)
Alkaline Phosphatase: 71 U/L (ref 40–150)
Glucose: 106 mg/dl (ref 70–140)
Sodium: 134 mEq/L — ABNORMAL LOW (ref 136–145)
Total Bilirubin: 0.23 mg/dL (ref 0.20–1.20)
Total Protein: 8.8 g/dL — ABNORMAL HIGH (ref 6.4–8.3)

## 2013-02-18 MED ORDER — SODIUM CHLORIDE 0.9 % IJ SOLN
10.0000 mL | Freq: Once | INTRAMUSCULAR | Status: AC
  Administered 2013-02-18: 10 mL via INTRAVENOUS
  Filled 2013-02-18: qty 10

## 2013-02-18 MED ORDER — SODIUM CHLORIDE 0.9 % IV SOLN
Freq: Once | INTRAVENOUS | Status: AC
  Administered 2013-02-18: 10:00:00 via INTRAVENOUS

## 2013-02-18 MED ORDER — SODIUM CHLORIDE 0.9 % IV SOLN
500.0000 mg | Freq: Once | INTRAVENOUS | Status: AC
  Administered 2013-02-18: 500 mg via INTRAVENOUS
  Filled 2013-02-18: qty 25

## 2013-02-18 MED ORDER — BORTEZOMIB CHEMO SQ INJECTION 3.5 MG (2.5MG/ML)
1.3000 mg/m2 | Freq: Once | INTRAMUSCULAR | Status: AC
  Administered 2013-02-18: 2.5 mg via SUBCUTANEOUS
  Filled 2013-02-18: qty 2.5

## 2013-02-18 MED ORDER — HEPARIN SOD (PORK) LOCK FLUSH 100 UNIT/ML IV SOLN
500.0000 [IU] | Freq: Once | INTRAVENOUS | Status: AC
  Administered 2013-02-18: 500 [IU] via INTRAVENOUS
  Filled 2013-02-18: qty 5

## 2013-02-18 MED ORDER — ONDANSETRON HCL 8 MG PO TABS
8.0000 mg | ORAL_TABLET | Freq: Once | ORAL | Status: AC
  Administered 2013-02-18: 8 mg via ORAL

## 2013-02-18 MED ORDER — DEXAMETHASONE SODIUM PHOSPHATE 20 MG/5ML IJ SOLN
40.0000 mg | Freq: Once | INTRAMUSCULAR | Status: AC
  Administered 2013-02-18: 40 mg via INTRAVENOUS

## 2013-02-18 NOTE — Patient Instructions (Addendum)
[facility] Discharge Instructions for Patients Receiving Chemotherapy  Today you received the following chemotherapy agents: Cytoxan and Velcade   To help prevent nausea and vomiting after your treatment, we encourage you to take your nausea medication as prescribed.    If you develop nausea and vomiting that is not controlled by your nausea medication, call the clinic.   BELOW ARE SYMPTOMS THAT SHOULD BE REPORTED IMMEDIATELY:  *FEVER GREATER THAN 100.5 F  *CHILLS WITH OR WITHOUT FEVER  NAUSEA AND VOMITING THAT IS NOT CONTROLLED WITH YOUR NAUSEA MEDICATION  *UNUSUAL SHORTNESS OF BREATH  *UNUSUAL BRUISING OR BLEEDING  TENDERNESS IN MOUTH AND THROAT WITH OR WITHOUT PRESENCE OF ULCERS  *URINARY PROBLEMS  *BOWEL PROBLEMS  UNUSUAL RASH Items with * indicate a potential emergency and should be followed up as soon as possible.  Feel free to call the clinic you have any questions or concerns. The clinic phone number is [phone].    

## 2013-02-18 NOTE — Telephone Encounter (Signed)
gv pt appt schedule for August and September. S/w Kim @ [facility] and pt already on schedule to see Dr. Isabel Caprice 9/4 @ 3:45pm. Per pt he is to have a referral to [facility]. S/w Dr. Karel Jarvis and per Dr. Karel Jarvis referral will be to [facility] and Zella Ball is working on this - pt aware.

## 2013-02-18 NOTE — Progress Notes (Signed)
Treat despite counts, per Dr. Karel Jarvis.

## 2013-02-18 NOTE — Progress Notes (Signed)
CC:   [name], M.D. [name], MD  PROBLEM LIST:  1. 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 IgG level on 08/17/2012 was 4760. Kappa light chains were 21.90. Beta 2 microglobulin 1.99. Albumin 3.4. Total protein 10.1 and globulin 6.7. 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 Stage by ISS for multiple myeloma is stage II. Metastatic bone survey carried out on 08/28/2012 was negative for evidence of multiple myeloma. A 24-hour urine protein collected on 09/16/2012 was 27 mg.  Urine immunofixation electrophoresis showed IgG heavy chains with  associated kappa light chains and excess monoclonal free kappa light  chains. Treatment with Velcade, Cytoxan and Decadron was started on 09/24/2012.  It will be recalled that this patient was diagnosed with  indolent/smoldering multiple myeloma, IgG kappa, when a bone marrow  carried out on 10/17/2009 showed 11% plasma cells. 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 IgG level on 11/30/2009 was 2870.  2. Leukopenia and neutropenia.  3. Anemia.  4. Adenocarcinoma of the prostate diagnosed on 12 core needle biopsies  carried out on 11/05/2012. Gleason score was 3+3=6. Three out of the 12  cores were involved. Core involvement was less than 5%-40%. Clinical  stage is T1C. PSA was 16.7.  5. Hypertension.  6. Hemangiomas of the liver seen on ultrasound carried out on  07/31/2009.  7. Lower back pain with radiation into the right leg.  Degenerative disk disease and spinal stenosis seen on an MRI of the  lumbar spine with and without IV contrast on 08/28/2012. There was no  evidence for myeloma or cancer. No pathologic fracture.  8. Status post laparoscopic cholecystectomy carried out in 2002.  9. Depression.  10. Systolic ejection murmur.  11. Financial and  medical insurance difficulties.  12. Elevated liver function tests in mid and late May 2014 secondary to excessive acetaminophen usage.  After the patient stopped taking acetaminophen, transaminases had returned to normal. 13. Right-sided IJ Port-A-Cath placed on 11/02/2012 by Dr. Jolaine Click from  Interventional Radiology.   MEDICATIONS:  Reviewed and recorded. Current Outpatient Prescriptions  Medication Sig Dispense Refill  . lidocaine-prilocaine (EMLA) cream Apply topically as needed. Apply to port site one hour before chemo and cover with plastic wrap.  30 g  3  . losartan (COZAAR) 100 MG tablet Take 100 mg by mouth every morning.       . metoCLOPramide (REGLAN) 10 MG tablet Take 1 tablet (10 mg total) by mouth every 6 (six) hours as needed (nausea/headache).  15 tablet  0  . OxyCODONE (OXYCONTIN) 10 mg T12A Take 1 tablet (10 mg total) by mouth every 12 (twelve) hours.  5 tablet  0  . PRESCRIPTION MEDICATION 1 Units by Other route every 7 (seven) days. Chemo (Velcade SQ and Cytoxan IV) once a week on friday       No current facility-administered medications for this visit.    TREATMENT PROGRAM:  1. Velcade 1.3 mg/m2, equal to 2.5 mg subcu weekly.  2. Cytoxan 300 mg per meter squared equal to 500 mg IV weekly.  3. Decadron 40 mg IV weekly.  Chemotherapy was started on 09/24/2012.    IMMUNIZATIONS:  Pneumovax was given on 10/22/2012.  Flu shot was given in December 2013.    SMOKING HISTORY: The patient smoked up to 2 packs of cigarettes a day  for 5 years. He has not smoked since his mid 65s.    HISTORY:  We saw [name] today for followup of his IgG kappa multiple myeloma, currently undergoing weekly treatments with Velcade, Cytoxan, and Decadron.  Treatments were started on 09/24/2012.  [name] was last seen by [name] on 11/26/2012.  He is here today by himself. As stated, he has continued with weekly treatments which he has tolerated well.  Because of a scheduling  error, Hasan did not receive chemotherapy on the week of 12/25/2012.  Shortly after his visit here on 11/26/2012, we noted that [name]'s transaminases were elevated.  On 12/04/2012, AST was 118, ALT 233.  We discovered that [name] was taking a lot of Tylenol and urged him to stop taking Tylenol.  With that, his transaminases have returned to normal as of today.  Off note, Dr. Gerarda Fraction Murinson, have spoken with Dr. Barron Alvine  about Saxon's prostate cancer.  Plans for a robotic prostatectomy were put on hold.  Dr. Arline Asp has believed that management of [name]'s multiple myeloma needs to take priority for the moment over any surgery or radiation treatments for his prostate cancer.  [name] seems to be doing generally well.  He is now on OxyContin 20 mg twice daily.  He is not taking any Tylenol, nor is he taking any of the short-acting Oxy-IR that we had prescribed for him for breakthrough pain.  [name] is scheduled for his weekly chemotherapy today.   Blood pressure 129/71, pulse 80, temperature 98.1 F (36.7 C), temperature source Oral, resp. rate 18, height 6' 1.5" (1.867 m), weight 172 lb 6.4 oz (78.2 kg). PHYSICAL EXAMINATION:  General:  On physical exam [name] looks well. He does not appear to be in pain at this time.  Vital Signs:  Weight is 162 pounds 8 ounces, height 6 feet 1-1/2 inches, body surface area 1.95 meters squared.   HEENT:  There is no scleral icterus.  Mouth and pharynx are benign.  Lymph:  No peripheral adenopathy palpable.  Lungs:  Clear to percussion and auscultation.  Cardiac:  Regular rhythm with systolic ejection murmur, more obvious when the patient is lying down.  Abdomen: Benign, with no organomegaly or masses palpable.  Extremities:  No peripheral edema or clubbing.  No localizing tenderness.  [name] has excellent strength in both legs.  Lines:  There is a right-sided Port-A- Cath covered with EMLA cream.  LABORATORY DATA:    CBC     Component Value Date/Time   WBC 2.3* 02/18/2013 0815   WBC 2.8* 12/21/2012 1345   RBC 3.05* 02/18/2013 0815   RBC 2.96* 12/21/2012 1345   HGB 10.2* 02/18/2013 0815   HGB 10.0* 12/21/2012 1345   HCT 30.4* 02/18/2013 0815   HCT 29.1* 12/21/2012 1345   PLT 193 02/18/2013 0815   PLT 175 12/21/2012 1345   MCV 99.7* 02/18/2013 0815   MCV 98.3 12/21/2012 1345   MCH 33.4 02/18/2013 0815   MCH 33.8 12/21/2012 1345   MCHC 33.6 02/18/2013 0815   MCHC 34.4 12/21/2012 1345   RDW 14.5 02/18/2013 0815   RDW 13.9 12/21/2012 1345   LYMPHSABS 0.8* 02/18/2013 0815   LYMPHSABS 0.2* 10/23/2012 1350   MONOABS 0.1 02/18/2013 0815   MONOABS 0.1 10/23/2012 1350   EOSABS 0.0 02/18/2013 0815   EOSABS 0.0 10/23/2012 1350   BASOSABS 0.0 02/18/2013 0815   BASOSABS 0.0 10/23/2012 1350    Lab Results  Component Value Date   GLUCOSE 138 02/11/2013   BUN  17.0 02/11/2013   CO2 23 02/11/2013   ALT 205* 02/11/2013   AST 106* 02/11/2013   LDH 157 01/28/2013   K 4.0 02/11/2013   CREATININE 1.2 02/11/2013   Results for JEMAL, MISKELL (MRN [id number]) as of 02/18/2013 09:05  Ref. Range 10/08/2012 08:24 10/22/2012 15:15 11/27/2012 07:48 01/01/2013 08:35 01/28/2013 07:54  Albumin ELP Latest Range: 55.8-66.1 %     46.5 (L)  COMMENT (PROTEIN ELECTROPHOR) No range found     *  Alpha-1-Globulin Latest Range: 2.9-4.9 %     3.0  Alpha-2-Globulin Latest Range: 7.1-11.8 %     8.0  Beta Globulin Latest Range: 4.7-7.2 %     5.3  Beta 2 Latest Range: 3.2-6.5 %     2.2 (L)  Gamma Globulin Latest Range: 11.1-18.8 %     35.0 (H)  M-SPIKE, % No range found     3.18  SPE Interp. No range found     *  IgG (Immunoglobin G), Serum Latest Range: [phone] mg/dL 4098 (H) 1191 (H) 4782 (H) 3420 (H) 4050 (H)  IgA Latest Range: 68-379 mg/dL     16 (L)  IgM, Serum Latest Range: 41-251 mg/dL     9 (L)  Total Protein, Serum Electrophoresis Latest Range: 6.0-8.3 g/dL     95.6 (H)  Kappa free light chain Latest Range: 0.33-1.94 mg/dL 2.13 (H) 08.65 (H) 7.84 (H) 20.40 (H) 7.89 (H)  Lambda  Free Lght Chn Latest Range: 0.57-2.63 mg/dL 6.96 2.95 (L) 2.84 (L) 0.71 0.25 (L)  Kappa:Lambda Ratio Latest Range: 0.26-1.65  10.25 (H) 55.71 (H) 21.28 (H) 28.73 (H) 31.56 (H)     IMAGING STUDIES:  1. Abdominal ultrasound carried out on 07/31/2009 showed  a hyperechoic lesion in the right lobe of the liver which basically had  not changed compared with the study carried out on 08/09/2006, felt to  be most consistent with a hemangioma. The patient was status post  cholecystectomy. Spleen was normal.  2. Metastatic bone survey on 08/08/2009 showed no lytic bone lesions.  3. Lumbar spine complete 4 views from 10/14/2011 showed L5-S1 and to a  lesser extent L4-L5 degenerative disk space narrowing. Otherwise, a  negative study.  4. Sacrum and coccyx 2 views showed moderate bilateral hip joint  degenerative changes. There were femoral head subchondral cystic  changes and sclerosis, greater on the right. There were prominent  degenerative changes at L5-S1.  5. Chest x-ray 2 view from 08/03/2012 showed no active cardiopulmonary  Disease.  6. Metastatic bone survey from 08/28/2012 was negative for evidence of  multiple myeloma or any destructive lesions.  7. MRI of the lumbar spine with and without IV contrast from 08/28/2012  showed no evidence for multiple myeloma. There were no pathologic  fractures. There was heterogeneous marrow signal. There was  degenerative disk disease at L4-L5 and L5-S1. There was mild L4-L5  central stenosis and bilateral lateral recess stenosis which potentially  affects the descending L5 nerves. The L5-S1 left lateral recess  encroachment potentially affects the descending left S1 nerve.  8. Nuclear medicine whole body bone scan from 08/28/2012 showed activity  within multiple joints, most likely arthritic or degenerative. There  was no pattern to suggest skeletal metastatic disease.  9. Right internal jugular vein Port-A-Cath was placed on 11/02/2012 by Dr.   Jolaine Click from Interventional Radiology.    IMPRESSION AND PLAN:  Mr. Travis Nguyen, is 40 y old male with IgG kappa multiple myeloma, which evolved from indolent/smoldering currently on CyBorD weekly.  looking at his myeloma marker it looks little higher than last timer but similar to the ones before, I don't have the full profile yet from today. We will plan to switch the cytoxan to Revelamid, if he demonstrated progression or no further response, but will refer Randal to see the myeloma group and the BMT group at [facility] prior to the final decision.  Today patient will receive Velcade 2.5 mg subcutaneous, Cytoxan 500 mg IV, Decadron 40 mg IV.  Raciel will continue to have weekly CBCs with each chemotherapy.  We will plan to see Sherry again on 3 weeks, at which time he should be seen at [facility] and final plan regarding his therapy would be determined at that point.    Of note patient's PSA continues to rise, we will refer him back to Dr. Barron Alvine.   Zachery Dakins, MD 02/18/2013 9:06 AM

## 2013-02-18 NOTE — Telephone Encounter (Signed)
Added to previous note - pt moved from CP1 to CP2 due CP1 out 8/13 to 9/2. Also confirmed appt for 9/4 w/Dr. Isabel Caprice - pt was already on schedule. S/w Kim @ [facility].

## 2013-02-22 LAB — KAPPA/LAMBDA LIGHT CHAINS
Kappa:Lambda Ratio: 30.52 — ABNORMAL HIGH (ref 0.26–1.65)
Lambda Free Lght Chn: 0.23 mg/dL — ABNORMAL LOW (ref 0.57–2.63)

## 2013-02-22 LAB — SPEP & IFE WITH QIG
Albumin ELP: 44.2 % — ABNORMAL LOW (ref 55.8–66.1)
Beta Globulin: 5.2 % (ref 4.7–7.2)
IgA: 16 mg/dL — ABNORMAL LOW (ref 68–379)
Total Protein, Serum Electrophoresis: 8.2 g/dL (ref 6.0–8.3)

## 2013-02-23 ENCOUNTER — Telehealth: Payer: Self-pay | Admitting: Oncology

## 2013-02-23 NOTE — Telephone Encounter (Signed)
Faxed pt medical records to Dr Julianne Rice @ [facility] .   After review his office will call with appt.

## 2013-02-25 ENCOUNTER — Other Ambulatory Visit: Payer: Self-pay | Admitting: Medical Oncology

## 2013-02-25 ENCOUNTER — Other Ambulatory Visit: Payer: Self-pay | Admitting: *Deleted

## 2013-02-25 ENCOUNTER — Telehealth: Payer: Self-pay | Admitting: Oncology

## 2013-02-25 ENCOUNTER — Ambulatory Visit (HOSPITAL_BASED_OUTPATIENT_CLINIC_OR_DEPARTMENT_OTHER): Payer: No Typology Code available for payment source

## 2013-02-25 ENCOUNTER — Other Ambulatory Visit (HOSPITAL_BASED_OUTPATIENT_CLINIC_OR_DEPARTMENT_OTHER): Payer: No Typology Code available for payment source | Admitting: Lab

## 2013-02-25 VITALS — BP 144/89 | HR 70 | Temp 98.2°F | Resp 20

## 2013-02-25 DIAGNOSIS — C9000 Multiple myeloma not having achieved remission: Secondary | ICD-10-CM

## 2013-02-25 DIAGNOSIS — C61 Malignant neoplasm of prostate: Secondary | ICD-10-CM

## 2013-02-25 DIAGNOSIS — D472 Monoclonal gammopathy: Secondary | ICD-10-CM

## 2013-02-25 DIAGNOSIS — Z5111 Encounter for antineoplastic chemotherapy: Secondary | ICD-10-CM

## 2013-02-25 DIAGNOSIS — Z5112 Encounter for antineoplastic immunotherapy: Secondary | ICD-10-CM

## 2013-02-25 LAB — CBC WITH DIFFERENTIAL/PLATELET
BASO%: 0.4 % (ref 0.0–2.0)
Eosinophils Absolute: 0 10*3/uL (ref 0.0–0.5)
HCT: 29.1 % — ABNORMAL LOW (ref 38.4–49.9)
LYMPH%: 44.2 % (ref 14.0–49.0)
MCHC: 33.7 g/dL (ref 32.0–36.0)
MCV: 100 fL — ABNORMAL HIGH (ref 79.3–98.0)
MONO%: 4.7 % (ref 0.0–14.0)
NEUT%: 50.3 % (ref 39.0–75.0)
Platelets: 170 10*3/uL (ref 140–400)
RBC: 2.91 10*6/uL — ABNORMAL LOW (ref 4.20–5.82)
nRBC: 0 % (ref 0–0)

## 2013-02-25 MED ORDER — DEXAMETHASONE SODIUM PHOSPHATE 20 MG/5ML IJ SOLN
40.0000 mg | Freq: Once | INTRAMUSCULAR | Status: AC
  Administered 2013-02-25: 40 mg via INTRAVENOUS

## 2013-02-25 MED ORDER — HEPARIN SOD (PORK) LOCK FLUSH 100 UNIT/ML IV SOLN
500.0000 [IU] | Freq: Once | INTRAVENOUS | Status: AC
  Administered 2013-02-25: 500 [IU] via INTRAVENOUS
  Filled 2013-02-25: qty 5

## 2013-02-25 MED ORDER — SODIUM CHLORIDE 0.9 % IV SOLN
500.0000 mg | Freq: Once | INTRAVENOUS | Status: AC
  Administered 2013-02-25: 500 mg via INTRAVENOUS
  Filled 2013-02-25: qty 25

## 2013-02-25 MED ORDER — ONDANSETRON HCL 8 MG PO TABS
8.0000 mg | ORAL_TABLET | Freq: Once | ORAL | Status: AC
  Administered 2013-02-25: 8 mg via ORAL

## 2013-02-25 MED ORDER — BORTEZOMIB CHEMO SQ INJECTION 3.5 MG (2.5MG/ML)
1.3000 mg/m2 | Freq: Once | INTRAMUSCULAR | Status: AC
  Administered 2013-02-25: 2.5 mg via SUBCUTANEOUS
  Filled 2013-02-25: qty 2.5

## 2013-02-25 MED ORDER — SODIUM CHLORIDE 0.9 % IJ SOLN
10.0000 mL | Freq: Once | INTRAMUSCULAR | Status: AC
  Administered 2013-02-25: 10 mL via INTRAVENOUS
  Filled 2013-02-25: qty 10

## 2013-02-25 MED ORDER — SODIUM CHLORIDE 0.9 % IV SOLN
INTRAVENOUS | Status: DC
  Administered 2013-02-25: 10:00:00 via INTRAVENOUS

## 2013-02-25 NOTE — Telephone Encounter (Signed)
Pt appt. With Dr. Marissa Calamity is 03/16/13@2 :00. Pt is aware. Medical records faxed. Belinda from Dr. Marissa Calamity office will request scans and slides.

## 2013-02-25 NOTE — Progress Notes (Signed)
Discharged at 1240, ambulatory in no distress/

## 2013-02-26 ENCOUNTER — Telehealth: Payer: Self-pay | Admitting: Hematology and Oncology

## 2013-02-26 NOTE — Telephone Encounter (Signed)
s.w. pt wife and advised on all appts...ok and aware °

## 2013-03-03 ENCOUNTER — Other Ambulatory Visit: Payer: Self-pay | Admitting: Hematology and Oncology

## 2013-03-03 DIAGNOSIS — C9000 Multiple myeloma not having achieved remission: Secondary | ICD-10-CM

## 2013-03-04 ENCOUNTER — Other Ambulatory Visit (HOSPITAL_BASED_OUTPATIENT_CLINIC_OR_DEPARTMENT_OTHER): Payer: No Typology Code available for payment source | Admitting: Lab

## 2013-03-04 ENCOUNTER — Ambulatory Visit (HOSPITAL_BASED_OUTPATIENT_CLINIC_OR_DEPARTMENT_OTHER): Payer: No Typology Code available for payment source

## 2013-03-04 VITALS — BP 148/83 | HR 96 | Temp 98.4°F

## 2013-03-04 DIAGNOSIS — Z5111 Encounter for antineoplastic chemotherapy: Secondary | ICD-10-CM

## 2013-03-04 DIAGNOSIS — Z5112 Encounter for antineoplastic immunotherapy: Secondary | ICD-10-CM

## 2013-03-04 DIAGNOSIS — C9000 Multiple myeloma not having achieved remission: Secondary | ICD-10-CM

## 2013-03-04 LAB — CBC WITH DIFFERENTIAL/PLATELET
BASO%: 0 % (ref 0.0–2.0)
EOS%: 0.3 % (ref 0.0–7.0)
HCT: 30.9 % — ABNORMAL LOW (ref 38.4–49.9)
LYMPH%: 32.1 % (ref 14.0–49.0)
MCH: 33.8 pg — ABNORMAL HIGH (ref 27.2–33.4)
MCHC: 34.3 g/dL (ref 32.0–36.0)
MONO%: 1.7 % (ref 0.0–14.0)
NEUT%: 65.9 % (ref 39.0–75.0)
Platelets: 187 10*3/uL (ref 140–400)
RBC: 3.14 10*6/uL — ABNORMAL LOW (ref 4.20–5.82)
WBC: 2.9 10*3/uL — ABNORMAL LOW (ref 4.0–10.3)
nRBC: 0 % (ref 0–0)

## 2013-03-04 LAB — COMPREHENSIVE METABOLIC PANEL (CC13)
ALT: 77 U/L — ABNORMAL HIGH (ref 0–55)
AST: 33 U/L (ref 5–34)
Alkaline Phosphatase: 76 U/L (ref 40–150)
BUN: 17.4 mg/dL (ref 7.0–26.0)
Calcium: 8.6 mg/dL (ref 8.4–10.4)
Creatinine: 0.9 mg/dL (ref 0.7–1.3)
Total Bilirubin: 0.33 mg/dL (ref 0.20–1.20)

## 2013-03-04 MED ORDER — ONDANSETRON HCL 8 MG PO TABS
8.0000 mg | ORAL_TABLET | Freq: Once | ORAL | Status: AC
  Administered 2013-03-04: 8 mg via ORAL

## 2013-03-04 MED ORDER — BORTEZOMIB CHEMO SQ INJECTION 3.5 MG (2.5MG/ML)
1.3000 mg/m2 | Freq: Once | INTRAMUSCULAR | Status: AC
  Administered 2013-03-04: 2.5 mg via SUBCUTANEOUS
  Filled 2013-03-04: qty 2.5

## 2013-03-04 MED ORDER — SODIUM CHLORIDE 0.9 % IV SOLN
Freq: Once | INTRAVENOUS | Status: AC
  Administered 2013-03-04: 09:00:00 via INTRAVENOUS

## 2013-03-04 MED ORDER — HEPARIN SOD (PORK) LOCK FLUSH 100 UNIT/ML IV SOLN
500.0000 [IU] | Freq: Once | INTRAVENOUS | Status: AC
  Administered 2013-03-04: 500 [IU] via INTRAVENOUS
  Filled 2013-03-04: qty 5

## 2013-03-04 MED ORDER — DEXAMETHASONE SODIUM PHOSPHATE 20 MG/5ML IJ SOLN
40.0000 mg | Freq: Once | INTRAMUSCULAR | Status: AC
  Administered 2013-03-04: 40 mg via INTRAVENOUS

## 2013-03-04 MED ORDER — SODIUM CHLORIDE 0.9 % IV SOLN
500.0000 mg | Freq: Once | INTRAVENOUS | Status: AC
  Administered 2013-03-04: 500 mg via INTRAVENOUS
  Filled 2013-03-04: qty 25

## 2013-03-04 MED ORDER — SODIUM CHLORIDE 0.9 % IJ SOLN
10.0000 mL | INTRAMUSCULAR | Status: DC | PRN
  Administered 2013-03-04: 10 mL via INTRAVENOUS
  Filled 2013-03-04: qty 10

## 2013-03-04 NOTE — Patient Instructions (Addendum)
[facility] Discharge Instructions for Patients Receiving Chemotherapy  Today you received the following chemotherapy agents velcade and cytoxan  To help prevent nausea and vomiting after your treatment, we encourage you to take your nausea medication as prescribed.   If you develop nausea and vomiting that is not controlled by your nausea medication, call the clinic.   BELOW ARE SYMPTOMS THAT SHOULD BE REPORTED IMMEDIATELY:  *FEVER GREATER THAN 100.5 F  *CHILLS WITH OR WITHOUT FEVER  NAUSEA AND VOMITING THAT IS NOT CONTROLLED WITH YOUR NAUSEA MEDICATION  *UNUSUAL SHORTNESS OF BREATH  *UNUSUAL BRUISING OR BLEEDING  TENDERNESS IN MOUTH AND THROAT WITH OR WITHOUT PRESENCE OF ULCERS  *URINARY PROBLEMS  *BOWEL PROBLEMS  UNUSUAL RASH Items with * indicate a potential emergency and should be followed up as soon as possible.  Feel free to call the clinic you have any questions or concerns. The clinic phone number is [phone].

## 2013-03-05 ENCOUNTER — Other Ambulatory Visit: Payer: Self-pay | Admitting: *Deleted

## 2013-03-05 NOTE — Telephone Encounter (Signed)
THIS REFILL REQUEST FOR REVLIMID WAS GIVEN TO DR.ROSTAPSHOV'S NURSE, NATALIE STROUD,RN. 

## 2013-03-08 ENCOUNTER — Encounter: Payer: Self-pay | Admitting: *Deleted

## 2013-03-08 NOTE — Progress Notes (Signed)
REVLIMID IS ON HOLD AWAITING AN APPOINTMENT AT [facility].

## 2013-03-11 ENCOUNTER — Ambulatory Visit (HOSPITAL_BASED_OUTPATIENT_CLINIC_OR_DEPARTMENT_OTHER): Payer: Self-pay

## 2013-03-11 ENCOUNTER — Other Ambulatory Visit (HOSPITAL_BASED_OUTPATIENT_CLINIC_OR_DEPARTMENT_OTHER): Payer: Self-pay | Admitting: Lab

## 2013-03-11 ENCOUNTER — Ambulatory Visit (HOSPITAL_BASED_OUTPATIENT_CLINIC_OR_DEPARTMENT_OTHER): Payer: No Typology Code available for payment source | Admitting: Hematology and Oncology

## 2013-03-11 ENCOUNTER — Encounter: Payer: Self-pay | Admitting: Medical Oncology

## 2013-03-11 VITALS — BP 142/87 | HR 73 | Temp 98.0°F | Resp 20 | Ht 73.5 in | Wt 176.8 lb

## 2013-03-11 DIAGNOSIS — Z5112 Encounter for antineoplastic immunotherapy: Secondary | ICD-10-CM

## 2013-03-11 DIAGNOSIS — C9000 Multiple myeloma not having achieved remission: Secondary | ICD-10-CM

## 2013-03-11 LAB — CBC WITH DIFFERENTIAL/PLATELET
Basophils Absolute: 0 10*3/uL (ref 0.0–0.1)
EOS%: 0.5 % (ref 0.0–7.0)
HCT: 28.9 % — ABNORMAL LOW (ref 38.4–49.9)
HGB: 10.2 g/dL — ABNORMAL LOW (ref 13.0–17.1)
LYMPH%: 36.1 % (ref 14.0–49.0)
MCH: 35.8 pg — ABNORMAL HIGH (ref 27.2–33.4)
MCV: 101.4 fL — ABNORMAL HIGH (ref 79.3–98.0)
NEUT%: 56.9 % (ref 39.0–75.0)
Platelets: 165 10*3/uL (ref 140–400)
lymph#: 0.9 10*3/uL (ref 0.9–3.3)

## 2013-03-11 MED ORDER — SODIUM CHLORIDE 0.9 % IV SOLN
500.0000 mg | Freq: Once | INTRAVENOUS | Status: AC
  Administered 2013-03-11: 500 mg via INTRAVENOUS
  Filled 2013-03-11: qty 25

## 2013-03-11 MED ORDER — BORTEZOMIB CHEMO SQ INJECTION 3.5 MG (2.5MG/ML)
1.3000 mg/m2 | Freq: Once | INTRAMUSCULAR | Status: AC
  Administered 2013-03-11: 2.5 mg via SUBCUTANEOUS
  Filled 2013-03-11: qty 2.5

## 2013-03-11 MED ORDER — ONDANSETRON HCL 8 MG PO TABS
8.0000 mg | ORAL_TABLET | Freq: Once | ORAL | Status: AC
  Administered 2013-03-11: 8 mg via ORAL

## 2013-03-11 MED ORDER — DEXAMETHASONE SODIUM PHOSPHATE 20 MG/5ML IJ SOLN
40.0000 mg | Freq: Once | INTRAMUSCULAR | Status: AC
  Administered 2013-03-11: 40 mg via INTRAVENOUS
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
## 2013-03-11 NOTE — Patient Instructions (Addendum)
[facility] Discharge Instructions for Patients Receiving Chemotherapy  Today you received the following chemotherapy agent Cytoxan and Velcade injection.  To help prevent nausea and vomiting after your treatment, we encourage you to take your nausea medication.   If you develop nausea and vomiting that is not controlled by your nausea medication, call the clinic.   BELOW ARE SYMPTOMS THAT SHOULD BE REPORTED IMMEDIATELY:  *FEVER GREATER THAN 100.5 F  *CHILLS WITH OR WITHOUT FEVER  NAUSEA AND VOMITING THAT IS NOT CONTROLLED WITH YOUR NAUSEA MEDICATION  *UNUSUAL SHORTNESS OF BREATH  *UNUSUAL BRUISING OR BLEEDING  TENDERNESS IN MOUTH AND THROAT WITH OR WITHOUT PRESENCE OF ULCERS  *URINARY PROBLEMS  *BOWEL PROBLEMS  UNUSUAL RASH Items with * indicate a potential emergency and should be followed up as soon as possible.  Feel free to call the clinic you have any questions or concerns. The clinic phone number is [phone].   Bortezomib injection What is this medicine? BORTEZOMIB (bor TEZ oh mib) is a chemotherapy drug. It slows the growth of cancer cells. This medicine is used to treat multiple myeloma, lymphoma, and other cancers. This medicine may be used for other purposes; ask your health care provider or pharmacist if you have questions. What should I tell my health care provider before I take this medicine? They need to know if you have any of these conditions: -heart disease -irregular heartbeat -liver disease -low blood counts, like low white blood cells, platelets, or hemoglobin -peripheral neuropathy -taking medicine for blood pressure -an unusual or allergic reaction to bortezomib, mannitol, boron, other medicines, foods, dyes, or preservatives -pregnant or trying to get pregnant -breast-feeding How should I use this medicine? This medicine is for injection into a vein or for injection under the skin. It is given by a health care professional in  a hospital or clinic setting. Talk to your pediatrician regarding the use of this medicine in children. Special care may be needed. Overdosage: If you think you have taken too much of this medicine contact a poison control center or emergency room at once. NOTE: This medicine is only for you. Do not share this medicine with others. What if I miss a dose? It is important not to miss your dose. Call your doctor or health care professional if you are unable to keep an appointment. What may interact with this medicine? -medicines for diabetes -medicines to increase blood counts like filgrastim, pegfilgrastim, sargramostim -zalcitabine Talk to your doctor or health care professional before taking any of these medicines: -acetaminophen -aspirin -ibuprofen -ketoprofen -naproxen This list may not describe all possible interactions. Give your health care provider a list of all the medicines, herbs, non-prescription drugs, or dietary supplements you use. Also tell them if you smoke, drink alcohol, or use illegal drugs. Some items may interact with your medicine. What should I watch for while using this medicine? Visit your doctor for checks on your progress. This drug may make you feel generally unwell. This is not uncommon, as chemotherapy can affect healthy cells as well as cancer cells. Report any side effects. Continue your course of treatment even though you feel ill unless your doctor tells you to stop. You may get drowsy or dizzy. Do not drive, use machinery, or do anything that needs mental alertness until you know how this medicine affects you. Do not stand or sit up quickly, especially if you are an older patient. This reduces the risk of dizzy or fainting spells. In some cases, you  may be given additional medicines to help with side effects. Follow all directions for their use. Call your doctor or health care professional for advice if you get a fever, chills or sore throat, or other symptoms  of a cold or flu. Do not treat yourself. This drug decreases your body's ability to fight infections. Try to avoid being around people who are sick. This medicine may increase your risk to bruise or bleed. Call your doctor or health care professional if you notice any unusual bleeding. Be careful brushing and flossing your teeth or using a toothpick because you may get an infection or bleed more easily. If you have any dental work done, tell your dentist you are receiving this medicine. Avoid taking products that contain aspirin, acetaminophen, ibuprofen, naproxen, or ketoprofen unless instructed by your doctor. These medicines may hide a fever. Do not become pregnant while taking this medicine. Women should inform their doctor if they wish to become pregnant or think they might be pregnant. There is a potential for serious side effects to an unborn child. Talk to your health care professional or pharmacist for more information. Do not breast-feed an infant while taking this medicine. You may have vomiting or diarrhea while taking this medicine. Drink water or other fluids as directed. What side effects may I notice from receiving this medicine? Side effects that you should report to your doctor or health care professional as soon as possible: -allergic reactions like skin rash, itching or hives, swelling of the face, lips, or tongue -breathing problems -changes in hearing -changes in vision -fast, irregular heartbeat -feeling faint or lightheaded, falls -pain, tingling, numbness in the hands or feet -seizures -swelling of the ankles, feet, hands -unusual bleeding or bruising -unusually weak or tired -vomiting Side effects that usually do not require medical attention (report to your doctor or health care professional if they continue or are bothersome): -changes in emotions or moods -constipation -diarrhea -loss of appetite -headache -irritation at site where injected -nausea This list  may not describe all possible side effects. Call your doctor for medical advice about side effects. You may report side effects to FDA at 1-800-FDA-1088. Where should I keep my medicine? This drug is given in a hospital or clinic and will not be stored at home. NOTE: This sheet is a summary. It may not cover all possible information. If you have questions about this medicine, talk to your doctor, pharmacist, or health care provider.  2013, [employer]. (08/08/2010 11:42:36 AM)

## 2013-03-11 NOTE — Progress Notes (Signed)
Received a refill for revlimid. I faxed back to [facility] with a note that states the drug is on hold. Pt is going to [facility] to be evaluated for possible transplant.

## 2013-03-11 NOTE — Progress Notes (Signed)
Ok to treat with anc @ 1.3 per Dr Roanna Raider.

## 2013-03-16 ENCOUNTER — Telehealth: Payer: Self-pay | Admitting: Hematology and Oncology

## 2013-03-18 ENCOUNTER — Other Ambulatory Visit (HOSPITAL_BASED_OUTPATIENT_CLINIC_OR_DEPARTMENT_OTHER): Payer: No Typology Code available for payment source | Admitting: Lab

## 2013-03-18 ENCOUNTER — Ambulatory Visit (HOSPITAL_BASED_OUTPATIENT_CLINIC_OR_DEPARTMENT_OTHER): Payer: No Typology Code available for payment source

## 2013-03-18 ENCOUNTER — Other Ambulatory Visit: Payer: Self-pay | Admitting: Hematology and Oncology

## 2013-03-18 ENCOUNTER — Telehealth: Payer: Self-pay | Admitting: *Deleted

## 2013-03-18 ENCOUNTER — Encounter: Payer: Self-pay | Admitting: Medical Oncology

## 2013-03-18 ENCOUNTER — Telehealth: Payer: Self-pay | Admitting: Hematology and Oncology

## 2013-03-18 ENCOUNTER — Telehealth: Payer: Self-pay | Admitting: Medical Oncology

## 2013-03-18 VITALS — BP 163/89 | HR 70 | Temp 98.5°F | Resp 20

## 2013-03-18 DIAGNOSIS — Z5112 Encounter for antineoplastic immunotherapy: Secondary | ICD-10-CM

## 2013-03-18 DIAGNOSIS — Z5111 Encounter for antineoplastic chemotherapy: Secondary | ICD-10-CM

## 2013-03-18 DIAGNOSIS — C9000 Multiple myeloma not having achieved remission: Secondary | ICD-10-CM

## 2013-03-18 LAB — CBC WITH DIFFERENTIAL/PLATELET
Basophils Absolute: 0 10*3/uL (ref 0.0–0.1)
Eosinophils Absolute: 0 10*3/uL (ref 0.0–0.5)
HGB: 9.8 g/dL — ABNORMAL LOW (ref 13.0–17.1)
MCV: 99 fL — ABNORMAL HIGH (ref 79.3–98.0)
MONO#: 0.1 10*3/uL (ref 0.1–0.9)
MONO%: 5.2 % (ref 0.0–14.0)
NEUT#: 1.6 10*3/uL (ref 1.5–6.5)
RDW: 14.3 % (ref 11.0–14.6)
lymph#: 1 10*3/uL (ref 0.9–3.3)

## 2013-03-18 LAB — COMPREHENSIVE METABOLIC PANEL (CC13)
Albumin: 3.2 g/dL — ABNORMAL LOW (ref 3.5–5.0)
CO2: 23 mEq/L (ref 22–29)
Chloride: 108 mEq/L (ref 98–109)
Glucose: 104 mg/dl (ref 70–140)
Potassium: 4.2 mEq/L (ref 3.5–5.1)
Sodium: 134 mEq/L — ABNORMAL LOW (ref 136–145)
Total Protein: 8.8 g/dL — ABNORMAL HIGH (ref 6.4–8.3)

## 2013-03-18 MED ORDER — SODIUM CHLORIDE 0.9 % IV SOLN
500.0000 mg | Freq: Once | INTRAVENOUS | Status: AC
  Administered 2013-03-18: 500 mg via INTRAVENOUS
  Filled 2013-03-18: qty 25

## 2013-03-18 MED ORDER — BORTEZOMIB CHEMO SQ INJECTION 3.5 MG (2.5MG/ML)
1.3000 mg/m2 | Freq: Once | INTRAMUSCULAR | Status: AC
  Administered 2013-03-18: 2.5 mg via SUBCUTANEOUS
  Filled 2013-03-18: qty 2.5

## 2013-03-18 MED ORDER — ONDANSETRON HCL 8 MG PO TABS
8.0000 mg | ORAL_TABLET | Freq: Once | ORAL | Status: AC
  Administered 2013-03-18: 8 mg via ORAL

## 2013-03-18 MED ORDER — DEXAMETHASONE SODIUM PHOSPHATE 20 MG/5ML IJ SOLN
40.0000 mg | Freq: Once | INTRAMUSCULAR | Status: AC
  Administered 2013-03-18: 40 mg via INTRAVENOUS

## 2013-03-18 MED ORDER — SODIUM CHLORIDE 0.9 % IV SOLN
Freq: Once | INTRAVENOUS | Status: AC
  Administered 2013-03-18: 09:00:00 via INTRAVENOUS

## 2013-03-18 NOTE — Patient Instructions (Addendum)
[facility] Discharge Instructions for Patients Receiving Chemotherapy  Today you received the following chemotherapy agents:  Velcade and Cytoxan  To help prevent nausea and vomiting after your treatment, we encourage you to take your nausea medication as ordered per MD.   If you develop nausea and vomiting that is not controlled by your nausea medication, call the clinic.   BELOW ARE SYMPTOMS THAT SHOULD BE REPORTED IMMEDIATELY:  *FEVER GREATER THAN 100.5 F  *CHILLS WITH OR WITHOUT FEVER  NAUSEA AND VOMITING THAT IS NOT CONTROLLED WITH YOUR NAUSEA MEDICATION  *UNUSUAL SHORTNESS OF BREATH  *UNUSUAL BRUISING OR BLEEDING  TENDERNESS IN MOUTH AND THROAT WITH OR WITHOUT PRESENCE OF ULCERS  *URINARY PROBLEMS  *BOWEL PROBLEMS  UNUSUAL RASH Items with * indicate a potential emergency and should be followed up as soon as possible.  Feel free to call the clinic you have any questions or concerns. The clinic phone number is [phone].   Please call [facility] to inform them you have rec'd Revlimid and question when you are to start therapy and inform us of start date.

## 2013-03-18 NOTE — Progress Notes (Signed)
Visit today was extended due to waiting on order approval from MD.

## 2013-03-18 NOTE — Telephone Encounter (Signed)
s.w. pt wife and advised on all appts...ok and aware °

## 2013-03-18 NOTE — Telephone Encounter (Signed)
Per staff message and POF I have scheduled appts.  JMW  

## 2013-03-18 NOTE — Telephone Encounter (Signed)
pt came in and wanted all appts earlier...emailed MW to move tx closer to labs.

## 2013-03-18 NOTE — Telephone Encounter (Signed)
I called Dr. Jeneen Rinks office to request office notes from office visit 03/16/13. Pt was here today and states that he needs to start revlimid. I read the care notes every way he recommends changes in his treatment. I spoke with pt's wife  aware that we will call him as soon we get the records with instructions.

## 2013-03-22 ENCOUNTER — Telehealth: Payer: Self-pay | Admitting: Internal Medicine

## 2013-03-22 ENCOUNTER — Encounter: Payer: Self-pay | Admitting: Medical Oncology

## 2013-03-22 NOTE — Telephone Encounter (Signed)
Moved pt from CP back to CP1 per Felicita Gage. S/w wife re change and new d/t/provider for 9/24 @ 1pm. Wife aware other appts remain the same and pt can get new schedule 9/11.

## 2013-03-24 ENCOUNTER — Encounter: Payer: Self-pay | Admitting: Medical Oncology

## 2013-03-24 ENCOUNTER — Other Ambulatory Visit: Payer: Self-pay | Admitting: Medical Oncology

## 2013-03-24 ENCOUNTER — Other Ambulatory Visit: Payer: Self-pay | Admitting: Internal Medicine

## 2013-03-24 MED ORDER — ACYCLOVIR 400 MG PO TABS: 400.0000 mg | ORAL_TABLET | Freq: Two times a day (BID) | ORAL | Status: DC

## 2013-03-24 NOTE — Progress Notes (Signed)
Notes from Dr. Marissa Calamity office reviewed by Dr. Jerrel Ivory. Pt will need to start Aspirin 81 mg daily, acylovir 400mg  twice a day and start his revlimid 25mg  daily. Pt will be here for chemotherapy in the am and pt will be instructed regarding these orders.

## 2013-03-25 ENCOUNTER — Other Ambulatory Visit: Payer: Self-pay | Admitting: *Deleted

## 2013-03-25 ENCOUNTER — Ambulatory Visit (HOSPITAL_BASED_OUTPATIENT_CLINIC_OR_DEPARTMENT_OTHER): Payer: No Typology Code available for payment source

## 2013-03-25 ENCOUNTER — Other Ambulatory Visit: Payer: Self-pay | Admitting: Lab

## 2013-03-25 ENCOUNTER — Other Ambulatory Visit (HOSPITAL_BASED_OUTPATIENT_CLINIC_OR_DEPARTMENT_OTHER): Payer: No Typology Code available for payment source | Admitting: Lab

## 2013-03-25 DIAGNOSIS — Z5112 Encounter for antineoplastic immunotherapy: Secondary | ICD-10-CM

## 2013-03-25 DIAGNOSIS — C9000 Multiple myeloma not having achieved remission: Secondary | ICD-10-CM

## 2013-03-25 LAB — COMPREHENSIVE METABOLIC PANEL (CC13)
Albumin: 3.3 g/dL — ABNORMAL LOW (ref 3.5–5.0)
Alkaline Phosphatase: 65 U/L (ref 40–150)
BUN: 15.9 mg/dL (ref 7.0–26.0)
Creatinine: 0.9 mg/dL (ref 0.7–1.3)
Glucose: 97 mg/dl (ref 70–140)
Potassium: 3.8 mEq/L (ref 3.5–5.1)

## 2013-03-25 LAB — CBC WITH DIFFERENTIAL/PLATELET
Basophils Absolute: 0 10*3/uL (ref 0.0–0.1)
Eosinophils Absolute: 0 10*3/uL (ref 0.0–0.5)
HCT: 29 % — ABNORMAL LOW (ref 38.4–49.9)
HGB: 10.2 g/dL — ABNORMAL LOW (ref 13.0–17.1)
LYMPH%: 29.6 % (ref 14.0–49.0)
MCV: 102.7 fL — ABNORMAL HIGH (ref 79.3–98.0)
MONO%: 4.6 % (ref 0.0–14.0)
NEUT#: 1.9 10*3/uL (ref 1.5–6.5)
NEUT%: 65.1 % (ref 39.0–75.0)
Platelets: 191 10*3/uL (ref 140–400)
RDW: 15.4 % — ABNORMAL HIGH (ref 11.0–14.6)

## 2013-03-25 MED ORDER — DEXAMETHASONE SODIUM PHOSPHATE 20 MG/5ML IJ SOLN
40.0000 mg | Freq: Once | INTRAMUSCULAR | Status: AC
  Administered 2013-03-25: 40 mg via INTRAVENOUS

## 2013-03-25 MED ORDER — ONDANSETRON HCL 8 MG PO TABS: 8.0000 mg | ORAL_TABLET | Freq: Once | ORAL | Status: DC

## 2013-03-25 MED ORDER — ACYCLOVIR 400 MG PO TABS: 400.0000 mg | ORAL_TABLET | Freq: Two times a day (BID) | ORAL | Status: DC

## 2013-03-25 MED ORDER — ONDANSETRON HCL 8 MG PO TABS
ORAL_TABLET | ORAL | Status: AC
  Administered 2013-03-25: 8 mg
  Filled 2013-03-25: qty 1

## 2013-03-25 MED ORDER — BORTEZOMIB CHEMO SQ INJECTION 3.5 MG (2.5MG/ML)
1.5000 mg/m2 | Freq: Once | INTRAMUSCULAR | Status: AC
  Administered 2013-03-25: 2.75 mg via SUBCUTANEOUS
  Filled 2013-03-25: qty 2.75

## 2013-03-25 MED ORDER — DEXAMETHASONE SODIUM PHOSPHATE 20 MG/5ML IJ SOLN
INTRAMUSCULAR | Status: AC
  Filled 2013-03-25: qty 10

## 2013-03-25 NOTE — Progress Notes (Signed)
This RN reviewed with pt new instructions per MD discussion - Per pt verbalization- he will pick up ASA 81 mg and acyclovir - note pt requested pharmacy change for pick up to the [facility] outpt pharmacy. He verified he has revlimid at home.

## 2013-03-25 NOTE — Patient Instructions (Signed)
 [facility] Discharge Instructions for Patients Receiving Chemotherapy  Today you received the following chemotherapy agents velcade/decadron  NEW MEDICATIONS ARE ASPIRIN LOW DOSE OF 81MG  DAILY AND ACTCLOVIR 400MG  TWICE A DAY  To help prevent nausea and vomiting after your treatment, we encourage you to take your nausea medication   If you develop nausea and vomiting that is not controlled by your nausea medication, call the clinic.   BELOW ARE SYMPTOMS THAT SHOULD BE REPORTED IMMEDIATELY:  *FEVER GREATER THAN 100.5 F  *CHILLS WITH OR WITHOUT FEVER  NAUSEA AND VOMITING THAT IS NOT CONTROLLED WITH YOUR NAUSEA MEDICATION  *UNUSUAL SHORTNESS OF BREATH  *UNUSUAL BRUISING OR BLEEDING  TENDERNESS IN MOUTH AND THROAT WITH OR WITHOUT PRESENCE OF ULCERS  *URINARY PROBLEMS  *BOWEL PROBLEMS  UNUSUAL RASH Items with * indicate a potential emergency and should be followed up as soon as possible.  Feel free to call the clinic you have any questions or concerns. The clinic phone number is [phone].

## 2013-03-31 ENCOUNTER — Other Ambulatory Visit: Payer: Self-pay

## 2013-03-31 DIAGNOSIS — D472 Monoclonal gammopathy: Secondary | ICD-10-CM

## 2013-03-31 DIAGNOSIS — C9000 Multiple myeloma not having achieved remission: Secondary | ICD-10-CM

## 2013-04-01 ENCOUNTER — Ambulatory Visit (HOSPITAL_BASED_OUTPATIENT_CLINIC_OR_DEPARTMENT_OTHER): Payer: No Typology Code available for payment source

## 2013-04-01 ENCOUNTER — Other Ambulatory Visit (HOSPITAL_BASED_OUTPATIENT_CLINIC_OR_DEPARTMENT_OTHER): Payer: Medicaid Other | Admitting: Lab

## 2013-04-01 ENCOUNTER — Other Ambulatory Visit: Payer: Self-pay | Admitting: Lab

## 2013-04-01 VITALS — BP 134/74 | HR 70 | Temp 97.5°F | Resp 18

## 2013-04-01 DIAGNOSIS — D472 Monoclonal gammopathy: Secondary | ICD-10-CM

## 2013-04-01 DIAGNOSIS — Z5112 Encounter for antineoplastic immunotherapy: Secondary | ICD-10-CM

## 2013-04-01 DIAGNOSIS — C9000 Multiple myeloma not having achieved remission: Secondary | ICD-10-CM

## 2013-04-01 LAB — COMPREHENSIVE METABOLIC PANEL (CC13)
ALT: 32 U/L (ref 0–55)
BUN: 9.8 mg/dL (ref 7.0–26.0)
CO2: 26 mEq/L (ref 22–29)
Calcium: 8.7 mg/dL (ref 8.4–10.4)
Chloride: 104 mEq/L (ref 98–109)
Creatinine: 0.9 mg/dL (ref 0.7–1.3)
Glucose: 91 mg/dl (ref 70–140)

## 2013-04-01 LAB — CBC WITH DIFFERENTIAL/PLATELET
Basophils Absolute: 0 10*3/uL (ref 0.0–0.1)
Eosinophils Absolute: 0 10*3/uL (ref 0.0–0.5)
HCT: 32.2 % — ABNORMAL LOW (ref 38.4–49.9)
HGB: 10.7 g/dL — ABNORMAL LOW (ref 13.0–17.1)
LYMPH%: 29.7 % (ref 14.0–49.0)
MONO#: 0.1 10*3/uL (ref 0.1–0.9)
NEUT#: 2.5 10*3/uL (ref 1.5–6.5)
NEUT%: 66.1 % (ref 39.0–75.0)
Platelets: 166 10*3/uL (ref 140–400)
WBC: 3.8 10*3/uL — ABNORMAL LOW (ref 4.0–10.3)
nRBC: 0 % (ref 0–0)

## 2013-04-01 MED ORDER — BORTEZOMIB CHEMO SQ INJECTION 3.5 MG (2.5MG/ML)
1.5000 mg/m2 | Freq: Once | INTRAMUSCULAR | Status: AC
  Administered 2013-04-01: 2.75 mg via SUBCUTANEOUS
  Filled 2013-04-01: qty 2.75

## 2013-04-01 MED ORDER — DEXAMETHASONE SODIUM PHOSPHATE 20 MG/5ML IJ SOLN
40.0000 mg | Freq: Once | INTRAMUSCULAR | Status: AC
  Administered 2013-04-01: 40 mg via INTRAVENOUS

## 2013-04-01 MED ORDER — ONDANSETRON 8 MG/NS 50 ML IVPB
INTRAVENOUS | Status: AC
  Filled 2013-04-01: qty 8

## 2013-04-01 MED ORDER — ONDANSETRON HCL 8 MG PO TABS
ORAL_TABLET | ORAL | Status: AC
  Filled 2013-04-01: qty 1

## 2013-04-01 MED ORDER — ONDANSETRON HCL 8 MG PO TABS
8.0000 mg | ORAL_TABLET | Freq: Once | ORAL | Status: AC
  Administered 2013-04-01: 8 mg via ORAL

## 2013-04-01 MED ORDER — HEPARIN SOD (PORK) LOCK FLUSH 100 UNIT/ML IV SOLN
500.0000 [IU] | Freq: Once | INTRAVENOUS | Status: AC
  Administered 2013-04-01: 500 [IU] via INTRAVENOUS
  Filled 2013-04-01: qty 5

## 2013-04-01 MED ORDER — DEXAMETHASONE SODIUM PHOSPHATE 20 MG/5ML IJ SOLN
INTRAMUSCULAR | Status: AC
  Filled 2013-04-01: qty 10

## 2013-04-01 MED ORDER — SODIUM CHLORIDE 0.9 % IV SOLN
Freq: Once | INTRAVENOUS | Status: AC
  Administered 2013-04-01: 09:00:00 via INTRAVENOUS

## 2013-04-01 MED ORDER — SODIUM CHLORIDE 0.9 % IJ SOLN
10.0000 mL | INTRAMUSCULAR | Status: DC | PRN
  Administered 2013-04-01: 10 mL via INTRAVENOUS
  Filled 2013-04-01: qty 10

## 2013-04-01 NOTE — Addendum Note (Signed)
Addended by: Keturah Barre on: 04/01/2013 10:09 AM   Modules accepted: Orders, SmartSet

## 2013-04-01 NOTE — Patient Instructions (Addendum)
[facility] Discharge Instructions for Patients Receiving Chemotherapy  Today you received the following chemotherapy agents Velcade and Decadron  To help prevent nausea and vomiting after your treatment, we encourage you to take your nausea medication as prescribed.   If you develop nausea and vomiting that is not controlled by your nausea medication, call the clinic.   BELOW ARE SYMPTOMS THAT SHOULD BE REPORTED IMMEDIATELY:  *FEVER GREATER THAN 100.5 F  *CHILLS WITH OR WITHOUT FEVER  NAUSEA AND VOMITING THAT IS NOT CONTROLLED WITH YOUR NAUSEA MEDICATION  *UNUSUAL SHORTNESS OF BREATH  *UNUSUAL BRUISING OR BLEEDING  TENDERNESS IN MOUTH AND THROAT WITH OR WITHOUT PRESENCE OF ULCERS  *URINARY PROBLEMS  *BOWEL PROBLEMS  UNUSUAL RASH Items with * indicate a potential emergency and should be followed up as soon as possible.  Feel free to call the clinic you have any questions or concerns. The clinic phone number is [phone].

## 2013-04-03 ENCOUNTER — Encounter (HOSPITAL_COMMUNITY): Payer: Self-pay | Admitting: Emergency Medicine

## 2013-04-03 ENCOUNTER — Emergency Department (HOSPITAL_COMMUNITY): Payer: No Typology Code available for payment source

## 2013-04-03 ENCOUNTER — Emergency Department (HOSPITAL_COMMUNITY)
Admission: EM | Admit: 2013-04-03 | Discharge: 2013-04-03 | Disposition: A | Discharge status: Home / Self Care | Payer: No Typology Code available for payment source | Attending: Emergency Medicine | Admitting: Emergency Medicine

## 2013-04-03 ENCOUNTER — Other Ambulatory Visit: Payer: Self-pay

## 2013-04-03 DIAGNOSIS — R0609 Other forms of dyspnea: Secondary | ICD-10-CM | POA: Insufficient documentation

## 2013-04-03 DIAGNOSIS — R011 Cardiac murmur, unspecified: Secondary | ICD-10-CM | POA: Insufficient documentation

## 2013-04-03 DIAGNOSIS — Z8546 Personal history of malignant neoplasm of prostate: Secondary | ICD-10-CM | POA: Insufficient documentation

## 2013-04-03 DIAGNOSIS — I10 Essential (primary) hypertension: Secondary | ICD-10-CM | POA: Insufficient documentation

## 2013-04-03 DIAGNOSIS — Z8719 Personal history of other diseases of the digestive system: Secondary | ICD-10-CM | POA: Insufficient documentation

## 2013-04-03 DIAGNOSIS — C439 Malignant melanoma of skin, unspecified: Secondary | ICD-10-CM | POA: Insufficient documentation

## 2013-04-03 DIAGNOSIS — M479 Spondylosis, unspecified: Secondary | ICD-10-CM | POA: Insufficient documentation

## 2013-04-03 DIAGNOSIS — Z7982 Long term (current) use of aspirin: Secondary | ICD-10-CM | POA: Insufficient documentation

## 2013-04-03 DIAGNOSIS — R0600 Dyspnea, unspecified: Secondary | ICD-10-CM

## 2013-04-03 DIAGNOSIS — R0682 Tachypnea, not elsewhere classified: Secondary | ICD-10-CM | POA: Insufficient documentation

## 2013-04-03 DIAGNOSIS — Z79899 Other long term (current) drug therapy: Secondary | ICD-10-CM | POA: Insufficient documentation

## 2013-04-03 DIAGNOSIS — Z862 Personal history of diseases of the blood and blood-forming organs and certain disorders involving the immune mechanism: Secondary | ICD-10-CM | POA: Insufficient documentation

## 2013-04-03 DIAGNOSIS — Z8701 Personal history of pneumonia (recurrent): Secondary | ICD-10-CM | POA: Insufficient documentation

## 2013-04-03 DIAGNOSIS — R0989 Other specified symptoms and signs involving the circulatory and respiratory systems: Secondary | ICD-10-CM | POA: Insufficient documentation

## 2013-04-03 HISTORY — DX: Cardiac murmur, unspecified: R01.1

## 2013-04-03 LAB — CBC WITH DIFFERENTIAL/PLATELET
Basophils Relative: 0 % (ref 0–1)
Eosinophils Absolute: 0 10*3/uL (ref 0.0–0.7)
Lymphocytes Relative: 30 % (ref 12–46)
MCH: 33.8 pg (ref 26.0–34.0)
MCHC: 34.3 g/dL (ref 30.0–36.0)
Monocytes Relative: 5 % (ref 3–12)
Neutrophils Relative %: 63 % (ref 43–77)
Platelets: 142 10*3/uL — ABNORMAL LOW (ref 150–400)
RDW: 14 % (ref 11.5–15.5)

## 2013-04-03 LAB — BASIC METABOLIC PANEL
BUN: 11 mg/dL (ref 6–23)
GFR calc Af Amer: >90 mL/min (ref >90)
GFR calc non Af Amer: >90 mL/min (ref >90)
Glucose, Bld: 84 mg/dL (ref 70–99)
Potassium: 3.8 mEq/L (ref 3.5–5.1)
Sodium: 131 mEq/L — ABNORMAL LOW (ref 135–145)
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
## 2013-04-03 NOTE — ED Notes (Signed)
Pt from home. Pt is a prostate and melanoma CA pt. Pt reports that he had last chemo 2 days ago and was given a new PO med, Revlimid for his melanoma 2 weeks ago. Pt states that this morning, "my breathing was different." Pt thinks may be from new medication. Pt denies SOB/difficulty breathing, but that the "pattern" was different. Pt O2 sats 100% RA. Pt denies other c/o. Pt in NAD and is A&O.

## 2013-04-07 ENCOUNTER — Other Ambulatory Visit (HOSPITAL_BASED_OUTPATIENT_CLINIC_OR_DEPARTMENT_OTHER): Payer: Medicaid Other | Admitting: Lab

## 2013-04-07 ENCOUNTER — Telehealth: Payer: Self-pay | Admitting: Internal Medicine

## 2013-04-07 ENCOUNTER — Other Ambulatory Visit: Payer: Self-pay | Admitting: Medical Oncology

## 2013-04-07 ENCOUNTER — Ambulatory Visit: Payer: Self-pay

## 2013-04-07 ENCOUNTER — Ambulatory Visit (HOSPITAL_BASED_OUTPATIENT_CLINIC_OR_DEPARTMENT_OTHER): Payer: Medicaid Other | Admitting: Internal Medicine

## 2013-04-07 ENCOUNTER — Encounter: Payer: Self-pay | Admitting: Internal Medicine

## 2013-04-07 ENCOUNTER — Other Ambulatory Visit: Payer: Self-pay | Admitting: Internal Medicine

## 2013-04-07 ENCOUNTER — Telehealth: Payer: Self-pay | Admitting: *Deleted

## 2013-04-07 VITALS — BP 160/76 | HR 69 | Temp 97.9°F | Resp 20 | Ht 73.5 in | Wt 172.9 lb

## 2013-04-07 DIAGNOSIS — C9000 Multiple myeloma not having achieved remission: Secondary | ICD-10-CM

## 2013-04-07 DIAGNOSIS — D472 Monoclonal gammopathy: Secondary | ICD-10-CM

## 2013-04-07 DIAGNOSIS — D649 Anemia, unspecified: Secondary | ICD-10-CM

## 2013-04-07 LAB — CBC WITH DIFFERENTIAL/PLATELET
BASO%: 0.4 % (ref 0.0–2.0)
Basophils Absolute: 0 10*3/uL (ref 0.0–0.1)
EOS%: 1.1 % (ref 0.0–7.0)
HCT: 32.3 % — ABNORMAL LOW (ref 38.4–49.9)
HGB: 11.1 g/dL — ABNORMAL LOW (ref 13.0–17.1)
MCH: 34.7 pg — ABNORMAL HIGH (ref 27.2–33.4)
MCHC: 34.3 g/dL (ref 32.0–36.0)
MONO%: 8.3 % (ref 0.0–14.0)
RBC: 3.2 10*6/uL — ABNORMAL LOW (ref 4.20–5.82)
RDW: 15.1 % — ABNORMAL HIGH (ref 11.0–14.6)
lymph#: 0.9 10*3/uL (ref 0.9–3.3)

## 2013-04-07 LAB — COMPREHENSIVE METABOLIC PANEL (CC13)
ALT: 32 U/L (ref 0–55)
AST: 25 U/L (ref 5–34)
Albumin: 3.5 g/dL (ref 3.5–5.0)
Alkaline Phosphatase: 61 U/L (ref 40–150)
Calcium: 8.9 mg/dL (ref 8.4–10.4)
Chloride: 104 mEq/L (ref 98–109)
Potassium: 4.2 mEq/L (ref 3.5–5.1)
Sodium: 137 mEq/L (ref 136–145)
Total Bilirubin: 0.46 mg/dL (ref 0.20–1.20)
Total Protein: 8.3 g/dL (ref 6.4–8.3)
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
## 2013-04-07 NOTE — Telephone Encounter (Signed)
Gave pt appt for chemo tomorrow

## 2013-04-07 NOTE — Telephone Encounter (Signed)
Per staff message and POF I have scheduled appts.  JMW  

## 2013-04-07 NOTE — Patient Instructions (Signed)
Bone Marrow Transplantation and Peripheral Blood Stem Cell Transplantation, Questions and Answers  WHAT ARE BONE MARROW TRANSPLANTATION AND PERIPHERAL BLOOD STEM CELL TRANSPLANTATION?   Bone marrow transplantation (BMT) and peripheral blood stem cell transplantation (PBSCT) are procedures that restore stem cells that have been destroyed by high doses of chemotherapy or radiation therapy.  There are 3 types of transplants:  Patients receive their own stem cells (autologous transplants).  Patients receive stem cells from their identical twin (syngeneic transplants).  Patients receive stem cells from someone other than themselves or an identical twin. The patient's brother, sister, parent, or a person not related to the patient may be used (allogeneic transplants). KEY POINTS   In general, patients are less likely to develop a complication known as graft-versus-host disease (GVHD) if the stem cells of the donor and patient are closely matched.  After being treated with high-dose anticancer drugs or radiation, the patient receives the harvested stem cells. They travel to the bone marrow and begin to produce new blood cells.  A "mini-transplant" uses lower, less toxic doses of chemotherapy or radiation to prepare the patient for transplant.  A "tandem transplant" involves 2 sequential courses of high-dose chemotherapy and stem cell transplant.  The National Marrow Donor Program (NMDP) maintains an international registry of volunteer stem cell donors. WHAT ARE BONE MARROW AND HEMATOPOIETIC STEM CELLS?   Bone marrow is the soft, sponge-like material found inside bones. Bone marrow contains a specific kind of cell that creates blood-forming stem cells (hematopoietic). Hematopoietic stem cells divide to form more blood-forming stem cells, or they mature into 1 of 3 types of blood cells:  White blood cells, which fight infection.  Red blood cells, which carry oxygen.  Platelets, which help the  blood to clot.  Most hematopoietic stem cells are found in the bone marrow. Some cells, called peripheral blood stem cells (PBSCs), are found in the bloodstream. Blood in the umbilical cord also contains hematopoietic stem cells. Cells from any of these sources can be used in transplants. WHY ARE BMT AND PBSCT USED IN CANCER TREATMENT?  One reason BMT and PBSCT are used in cancer treatment is to make it possible for patients to receive very high doses of chemotherapy or radiation therapy. To understand more about why BMT and PBSCT are used, it is helpful to understand how chemotherapy and radiation therapy work.   Chemotherapy and radiation therapy generally affect cells that divide rapidly. They are used to treat cancer because cancer cells divide more often than most healthy cells. However, because bone marrow cells also divide frequently, high-dose treatments can severely damage or destroy the patient's bone marrow. Without healthy bone marrow, the patient is no longer able to make the blood cells needed to:  Carry oxygen.  Fight infection.  Prevent bleeding. BMT and PBSCT replace stem cells that were destroyed by treatment. The healthy, transplanted stem cells can restore the bone marrow's ability to produce the blood cells the patient needs.  In some types of leukemia, the graft-versus-tumor (GVT) effect that occurs after allogeneic BMT and PBSCT is crucial to the effectiveness of the treatment. GVT occurs when white blood cells from the donor identify the cancer cells that remain in the patient's body after the chemotherapy or radiation therapy (the tumor) as foreign. They then attack them.  WHAT TYPES OF CANCER USE BMT AND PBSCT?  BMT and PBSCT are most commonly used in the treatment of leukemia and lymphoma. They are most effective when the leukemia or lymphoma is  in remission. This means that the signs and symptoms of cancer have disappeared. BMT and PBSCT are also used to treat other  cancers such as:  Cancer that arises in immature nerve cells and affects mostly infants and children (neuroblastoma).  Cancer of the plasma cells (multiple myeloma). Researchers are evaluating BMT and PBSCT for the treatment of various types of cancer. HOW ARE THE DONOR'S STEM CELLS MATCHED TO THE PATIENT'S STEM CELLS IN ALLOGENEIC OR SYNGENEIC TRANSPLANTATION?   To minimize potential side effects, caregivers most often use transplanted stem cells that match the patient's own stem cells as closely as possible. People have different sets of proteins on the surface of their cells. They are called human leukocyte-associated (HLA) antigens. The set of proteins, called the HLA type, is identified by a blood test.  In most cases, the success of allogeneic transplantation depends in part on how well the HLA antigens of the donor's stem cells match those of the recipient's stem cells. The higher the number of matching HLA antigens, the greater the chance that the patient's body will accept the donor's stem cells. In general, patients are less likely to develop the complication known as GVHD if the stem cells of the donor and patient are closely matched.  Close relatives, especially brothers and sisters, are more likely than unrelated people to be HLA-matched. But only 25% to 35% of patients have an HLA-matched sibling. The chances of obtaining HLA-matched stem cells from an unrelated donor are slightly better. It is approximately 50%. Among unrelated donors, HLA-matching is greatly improved when the donor and recipient have the same ethnic and racial background. The number of donors is increasing. However, individuals from certain ethnic and racial groups still have a lower chance of finding a matching donor. Large volunteer donor registries can help in finding an appropriate, unrelated donor.  Identical twins have the same genes, so they have the same set of HLA antigens. As a result, the patient's body will  accept a transplant from an identical twin. However, identical twins represent a small number of all births, so syngeneic transplantation is rare. HOW IS BONE MARROW OBTAINED FOR TRANSPLANTATION?  The stem cells used in BMT come from the liquid center of the bone. This is called the marrow. In general, the procedure for obtaining bone marrow, which is called harvesting, is similar for all 3 types of BMTs (autologous, syngeneic, and allogeneic). The donor is given either general or local anesthetic. General anesthetic puts the person to sleep during the procedure. Local anesthetic causes loss of feeling below the waist. Needles are inserted through the skin over the hip (pelvic) bone, or in rare cases, the breastbone (sternum). Then the needles go into the bone marrow to draw the marrow out of the bone. Harvesting the marrow takes about 1 hour. The harvested bone marrow is then processed to remove blood and bone fragments. Harvested bone marrow can be combined with a preservative and frozen to keep the stem cells alive until they are needed. This technique is known as cryopreservation. Stem cells can be preserved this way for many years.  HOW ARE PBSCS OBTAINED FOR TRANSPLANTATION?  The stem cells used in PBSCT come from the bloodstream. A process called apheresis is used to obtain PBSCs for transplantation. For 4 or 5 days before apheresis, the donor may be given a medicine to increase the number of stem cells released into the bloodstream. In apheresis, blood is removed through a large vein in the arm or a central  venous catheter. This is a flexible tube that is placed in a large vein in the neck, chest, or groin area. The blood goes through a machine that removes the stem cells. The blood is then returned to the donor. The collected cells are then stored. Apheresis typically takes 4 to 6 hours. The stem cells are then frozen until they are given to the recipient. Apheresis may also be called  leukapheresis. HOW ARE UMBILICAL CORD STEM CELLS OBTAINED FOR TRANSPLANTATION?   Stem cells also may be retrieved from umbilical cord blood. For this to occur, the mother must contact a cord blood bank before the baby's birth. The cord blood bank may request that she complete a questionnaire and give a small blood sample.  Cord blood banks may be public or commercial. Public cord blood banks accept donations of cord blood. They may provide the donated stem cells to another matched individual in their network. In contrast, commercial cord blood banks will store the cord blood for the family. This is in case it is needed later for the child or another family member.  After the baby is born and the umbilical cord has been cut, blood is retrieved from the umbilical cord and placenta. This process does not cause health risks to the mother or the child. If the mother agrees, the umbilical cord blood is processed and frozen for storage by the cord blood bank. Only a small amount of blood can be retrieved from the umbilical cord and placenta, so the collected stem cells are typically used for children or small adults. ARE ANY RISKS ASSOCIATED WITH DONATING BONE MARROW?   Only a small amount of bone marrow is removed. Donating usually does not pose any significant problems for the donor. The most serious risk involves the use of anesthesia during the procedure.  The area where the bone marrow was taken out may feel stiff or sore for a few days. The donor may feel tired. Within a few weeks, the donor's body replaces the donated marrow. The time required for a donor to recover varies. Some people are back to their usual routine within 2 or 3 days. Others may take up to 3 to 4 weeks to fully recover their strength. ARE ANY RISKS ASSOCIATED WITH DONATING PBSCS?  Apheresis usually causes little discomfort. During apheresis, the patient may feel:  Lightheaded.  Chills.  Numbness around the lips.  Cramping in  the hands. Unlike bone marrow donation, PBSC donation does not require anesthesia. The medicine that is given to stimulate the release of stem cells from the marrow into the bloodstream may cause:  Bone and muscle aches.  Headaches.  Fatigue.  Nausea.  Vomiting.  Difficulty sleeping. These side effects generally stop within 2 to 3 days of the last dose of the medicine.  HOW DOES THE PATIENT RECEIVE THE STEM CELLS DURING THE TRANSPLANT?  After being treated with high-dose anticancer drugs or radiation, the patient receives the stem cells through an intravenous line (IV). This part of the transplant takes 1 to 5 hours.  ARE ANY SPECIAL MEASURES TAKEN WHEN THE CANCER PATIENT IS ALSO THE DONOR (AUTOLOGOUS TRANSPLANT)?  The stem cells used for autologous transplantation must be relatively free of cancer cells. The harvested cells can sometimes be treated before transplantation. A process known as purging is used to get rid of cancer cells. This process can remove some cancer cells from the harvested cells and minimize the chance that cancer will come back. Purging may damage some  healthy stem cells, so more cells are obtained from the patient before the transplant. This is to ensure that enough healthy stem cells will remain after purging.  WHAT HAPPENS AFTER THE STEM CELLS HAVE BEEN TRANSPLANTED TO THE PATIENT?  After entering the bloodstream, the stem cells travel to the bone marrow. There they begin to produce new white blood cells, red blood cells, and platelets in a process known as engraftment. Engraftment usually occurs within about 2 to 4 weeks after transplantation. Your caregivers monitor it by checking blood counts on a frequent basis. Complete recovery of immune function takes much longer. It can take up to several months for autologous transplant recipients and 1 to 2 years for patients receiving allogeneic or syngeneic transplants. Caregivers evaluate the results of various blood tests  to confirm that new blood cells are being produced and that the cancer has not returned. The removal of a small sample of bone marrow through a needle for examination under a microscope (bone marrow aspiration) can also help caregivers determine how well the new marrow is working.  WHAT ARE THE POSSIBLE SIDE EFFECTS OF BMT AND PBSCT?   The major risk of both treatments is an increased susceptibility to infection and bleeding as a result of the high-dose cancer treatment. Caregivers may give the patient antibiotics to prevent or treat infection. They may also give the patient transfusions of platelets to prevent bleeding and red blood cells to treat anemia. Patients who undergo BMT and PBSCT may experience short-term side effects. These include:  Nausea.  Vomiting.  Fatigue.  Loss of appetite.  Mouth sores.  Hair loss.  Skin reactions.  Potential long-term risks include complications of the pre-transplant chemotherapy and radiation therapy, such as:  Inability to produce children (infertility).  Clouding of the lens of the eye (cataracts). This causes loss of vision.  New (secondary) cancers.  Damage to the liver, kidneys, lungs, or heart.  With allogeneic transplants, the complication known as GVHD sometimes develops. GVHD occurs when white blood cells from the donor identify cells in the patient's body as foreign and attack them. The most commonly damaged organs are the skin, liver, and intestines. This complication can develop within a few weeks of the transplant (acute GVHD) or much later (chronic GVHD). To prevent this complication, the patient may receive medicines that suppress the immune system. Also, the donated stem cells can be treated to remove the white blood cells that cause GVHD. This is a process called T-cell depletion. If GVHD develops, it can be very serious. It is treated with steroids or other immunosuppressive agents. GVHD can be difficult to treat. However, some  studies suggest that patients with leukemia who develop GVHD are less likely to have the cancer come back. Clinical trials are being conducted to find ways to prevent and treat GVHD.  The likelihood and severity of complications are specific to the patient's treatment. They should be discussed with your caregiver. WHAT IS A "MINI-TRANSPLANT"?   A "mini-transplant" is also called a non-myeloablative or reduced-intensity transplant. It is a type of allogeneic transplant. This approach is being studied in clinical trials for the treatment of several types of cancer. These include:  Leukemia.  Lymphoma.  Multiple myeloma.  Other cancers of the blood.  A mini-transplant uses lower, less toxic doses of chemotherapy or radiation to prepare the patient for an allogeneic transplant. The use of lower doses of anticancer drugs and radiation eliminates some, but not all, of the patient's bone marrow. It also  reduces the number of cancer cells. It also suppresses the patient's immune system to prevent rejection of the transplant.  Unlike traditional BMT or PBSCT, cells from both the donor and the patient may exist in the patient's body for some time after a mini-transplant. Once the cells from the donor begin to engraft, they may cause the GVT effect and work to destroy the cancer cells that were not eliminated by the anticancer drugs or radiation. To boost the GVT effect, the patient may be given an injection of the donor's white blood cells. This procedure is called a donor lymphocyte infusion. WHAT IS A "TANDEM TRANSPLANT"?  A "tandem transplant" is a type of autologous transplant. This method is being studied in clinical trials for the treatment of several types of cancer, including multiple myeloma and germ cell cancer. During a tandem transplant, a patient receives 2 courses of high-dose chemotherapy, one after another (sequential), with stem cell transplant. Typically, the 2 courses are given several  weeks to several months apart. Researchers hope that this method can prevent the cancer from coming back at a later time.  FOR MORE INFORMATION  National Marrow Donor Program: www.marrow.org The NMDP is a [employer]. It was created to improve the effectiveness of the search for donors. The NMDP maintains an international registry of volunteers willing to be donors for all sources of blood stem cells used in transplantation:  Bone marrow.  Peripheral blood.  Umbilical cord blood. The NMDP website contains a list of participating transplant centers. The list includes descriptions of the centers as well as their transplant experience, survival statistics, research interests, pre-transplant costs, and contact information.  Document Released: 03/12/2004 Document Revised: 09/23/2011 Document Reviewed: 06/27/2008 [facility] Patient Information 2014 [place], [place]. Bortezomib injection What is this medicine? BORTEZOMIB (bor TEZ oh mib) is a chemotherapy drug. It slows the growth of cancer cells. This medicine is used to treat multiple myeloma, lymphoma, and other cancers. This medicine may be used for other purposes; ask your health care provider or pharmacist if you have questions. What should I tell my health care provider before I take this medicine? They need to know if you have any of these conditions: -heart disease -irregular heartbeat -liver disease -low blood counts, like low white blood cells, platelets, or hemoglobin -peripheral neuropathy -taking medicine for blood pressure -an unusual or allergic reaction to bortezomib, mannitol, boron, other medicines, foods, dyes, or preservatives -pregnant or trying to get pregnant -breast-feeding How should I use this medicine? This medicine is for injection into a vein or for injection under the skin. It is given by a health care professional in a hospital or clinic setting. Talk to your pediatrician regarding the  use of this medicine in children. Special care may be needed. Overdosage: If you think you have taken too much of this medicine contact a poison control center or emergency room at once. NOTE: This medicine is only for you. Do not share this medicine with others. What if I miss a dose? It is important not to miss your dose. Call your doctor or health care professional if you are unable to keep an appointment. What may interact with this medicine? -medicines for diabetes -medicines to increase blood counts like filgrastim, pegfilgrastim, sargramostim -zalcitabine Talk to your doctor or health care professional before taking any of these medicines: -acetaminophen -aspirin -ibuprofen -ketoprofen -naproxen This list may not describe all possible interactions. Give your health care provider a list of all the medicines, herbs, non-prescription drugs,  or dietary supplements you use. Also tell them if you smoke, drink alcohol, or use illegal drugs. Some items may interact with your medicine. What should I watch for while using this medicine? Visit your doctor for checks on your progress. This drug may make you feel generally unwell. This is not uncommon, as chemotherapy can affect healthy cells as well as cancer cells. Report any side effects. Continue your course of treatment even though you feel ill unless your doctor tells you to stop. You may get drowsy or dizzy. Do not drive, use machinery, or do anything that needs mental alertness until you know how this medicine affects you. Do not stand or sit up quickly, especially if you are an older patient. This reduces the risk of dizzy or fainting spells. In some cases, you may be given additional medicines to help with side effects. Follow all directions for their use. Call your doctor or health care professional for advice if you get a fever, chills or sore throat, or other symptoms of a cold or flu. Do not treat yourself. This drug decreases your body's  ability to fight infections. Try to avoid being around people who are sick. This medicine may increase your risk to bruise or bleed. Call your doctor or health care professional if you notice any unusual bleeding. Be careful brushing and flossing your teeth or using a toothpick because you may get an infection or bleed more easily. If you have any dental work done, tell your dentist you are receiving this medicine. Avoid taking products that contain aspirin, acetaminophen, ibuprofen, naproxen, or ketoprofen unless instructed by your doctor. These medicines may hide a fever. Do not become pregnant while taking this medicine. Women should inform their doctor if they wish to become pregnant or think they might be pregnant. There is a potential for serious side effects to an unborn child. Talk to your health care professional or pharmacist for more information. Do not breast-feed an infant while taking this medicine. You may have vomiting or diarrhea while taking this medicine. Drink water or other fluids as directed. What side effects may I notice from receiving this medicine? Side effects that you should report to your doctor or health care professional as soon as possible: -allergic reactions like skin rash, itching or hives, swelling of the face, lips, or tongue -breathing problems -changes in hearing -changes in vision -fast, irregular heartbeat -feeling faint or lightheaded, falls -pain, tingling, numbness in the hands or feet -seizures -swelling of the ankles, feet, hands -unusual bleeding or bruising -unusually weak or tired -vomiting Side effects that usually do not require medical attention (report to your doctor or health care professional if they continue or are bothersome): -changes in emotions or moods -constipation -diarrhea -loss of appetite -headache -irritation at site where injected -nausea This list may not describe all possible side effects. Call your doctor for medical  advice about side effects. You may report side effects to FDA at 1-800-FDA-1088. Where should I keep my medicine? This drug is given in a hospital or clinic and will not be stored at home. NOTE: This sheet is a summary. It may not cover all possible information. If you have questions about this medicine, talk to your doctor, pharmacist, or health care provider.  2012, [employer]. (08/08/2010 11:42:36 AM)Dexamethasone injection What is this medicine? DEXAMETHASONE (dex a METH a sone) is a corticosteroid. It is used to treat inflammation of the skin, joints, lungs, and other organs. Common conditions treated include asthma, allergies,  and arthritis. It is also used for other conditions, like blood disorders and diseases of the adrenal glands. This medicine may be used for other purposes; ask your health care provider or pharmacist if you have questions. What should I tell my health care provider before I take this medicine? They need to know if you have any of these conditions: -blood clotting problems -Cushing's syndrome -diabetes -glaucoma -heart problems or disease -high blood pressure -infection like herpes, measles, tuberculosis, or chickenpox -kidney disease -liver disease -mental problems -myasthenia gravis -osteoporosis -previous heart attack -seizures -stomach, ulcer or intestine disease including colitis and diverticulitis -thyroid problem -an unusual or allergic reaction to dexamethasone, corticosteroids, other medicines, lactose, foods, dyes, or preservatives -pregnant or trying to get pregnant -breast-feeding How should I use this medicine? This medicine is for injection into a muscle, joint, lesion, soft tissue, or vein. It is given by a health care professional in a hospital or clinic setting. Talk to your pediatrician regarding the use of this medicine in children. Special care may be needed. Overdosage: If you think you have taken too much of this medicine  contact a poison control center or emergency room at once. NOTE: This medicine is only for you. Do not share this medicine with others. What if I miss a dose? This may not apply. If you are having a series of injections over a prolonged period, try not to miss an appointment. Call your doctor or health care professional to reschedule if you are unable to keep an appointment. What may interact with this medicine? Do not take this medicine with any of the following medications: -mifepristone, RU-486 -vaccines This medicine may also interact with the following medications: -amphotericin B -antibiotics like clarithromycin, erythromycin, and troleandomycin -aspirin and aspirin-like drugs -barbiturates like phenobarbital -carbamazepine -cholestyramine -cholinesterase inhibitors like donepezil, galantamine, rivastigmine, and tacrine -cyclosporine -digoxin -diuretics -ephedrine -male hormones, like estrogens or progestins and birth control pills -indinavir -isoniazid -ketoconazole -medicines for diabetes -medicines that improve muscle tone or strength for conditions like myasthenia gravis -NSAIDs, medicines for pain and inflammation, like ibuprofen or naproxen -phenytoin -rifampin -thalidomide -warfarin This list may not describe all possible interactions. Give your health care provider a list of all the medicines, herbs, non-prescription drugs, or dietary supplements you use. Also tell them if you smoke, drink alcohol, or use illegal drugs. Some items may interact with your medicine. What should I watch for while using this medicine? Your condition will be monitored carefully while you are receiving this medicine. If you are taking this medicine for a long time, carry an identification card with your name and address, the type and dose of your medicine, and your doctor's name and address. This medicine may increase your risk of getting an infection. Stay away from people who are sick.  Tell your doctor or health care professional if you are around anyone with measles or chickenpox. Talk to your health care provider before you get any vaccines that you take this medicine. If you are going to have surgery, tell your doctor or health care professional that you have taken this medicine within the last twelve months. Ask your doctor or health care professional about your diet. You may need to lower the amount of salt you eat. The medicine can increase your blood sugar. If you are a diabetic check with your doctor if you need help adjusting the dose of your diabetic medicine. What side effects may I notice from receiving this medicine? Side effects that you should report to  your doctor or health care professional as soon as possible: -allergic reactions like skin rash, itching or hives, swelling of the face, lips, or tongue -black or tarry stools -change in the amount of urine -changes in vision -confusion, excitement, restlessness, a false sense of well-being -fever, sore throat, sneezing, cough, or other signs of infection, wounds that will not heal -hallucinations -increased thirst -mental depression, mood swings, mistaken feelings of self importance or of being mistreated -pain in hips, back, ribs, arms, shoulders, or legs -pain, redness, or irritation at the injection site -redness, blistering, peeling or loosening of the skin, including inside the mouth -rounding out of face -swelling of feet or lower legs -unusual bleeding or bruising -unusual tired or weak -wounds that do not heal Side effects that usually do not require medical attention (report to your doctor or health care professional if they continue or are bothersome): -diarrhea or constipation -change in taste -headache -nausea, vomiting -skin problems, acne, thin and shiny skin -touble sleeping -unusual growth of hair on the face or body -weight gain This list may not describe all possible side effects.  Call your doctor for medical advice about side effects. You may report side effects to FDA at 1-800-FDA-1088. Where should I keep my medicine? This drug is given in a hospital or clinic and will not be stored at home. NOTE: This sheet is a summary. It may not cover all possible information. If you have questions about this medicine, talk to your doctor, pharmacist, or health care provider.  2012, Elsevier/Gold Standard. (10/22/2007 2:04:12 PM)Lenalidomide Oral Capsules What is this medicine? LENALIDOMIDE (len a LID oh mide) is used to treat anemia from a myelodysplastic syndrome. It is also used to treat multiple myeloma. The medicine may help you to need blood transfusions less often. This medicine may be used for other purposes; ask your health care provider or pharmacist if you have questions. What should I tell my health care provider before I take this medicine? They need to know if you have any of these conditions: -blood clots in the legs or the lungs -infection -irregular monthly periods or menstrual cycles -kidney disease -an unusual or allergic reaction to lenalidomide, other medicines, foods, dyes, or preservatives -pregnant or trying to get pregnant -breast-feeding How should I use this medicine? Take this medicine by mouth with a glass of water. Follow the directions on the prescription label. Do not cut, crush, or chew this medicine. Take your medicine at regular intervals. Do not take it more often than directed. Do not stop taking except on your doctor's advice. A MedGuide will be given with each prescription and refill. Read this guide carefully each time. The MedGuide may change frequently. Talk to your pediatrician regarding the use of this medicine in children. Special care may be needed. Overdosage: If you think you have taken too much of this medicine contact a poison control center or emergency room at once. NOTE: This medicine is only for you. Do not share this medicine  with others. What if I miss a dose? If you miss a dose, take it as soon as you can. If your next dose is to be taken in less than 12 hours, then do not take the missed dose. Take the next dose at your regular time. Do not take double or extra doses. What may interact with this medicine? -vaccines This list may not describe all possible interactions. Give your health care provider a list of all the medicines, herbs, non-prescription drugs, or dietary  supplements you use. Also tell them if you smoke, drink alcohol, or use illegal drugs. Some items may interact with your medicine. What should I watch for while using this medicine? Visit your doctor for regular check ups. Tell your doctor or healthcare professional if your symptoms do not start to get better or if they get worse. You will need to have important blood work done while you are taking this medicine. This medicine is available only through a special program. Doctors, pharmacies, and patients must meet all of the conditions of the program. Your health care provider will help you get signed up with the program if you need this medicine. Through the program you will only receive up to a 28 day supply of the medicine at one time. You will need a new prescription for each refill. This medicine can cause birth defects. Do not get pregnant while taking this drug. Females with child-bearing potential will need to have 2 negative pregnancy tests before starting this medicine. Pregnancy testing must be done every 2 to 4 weeks as directed while taking this medicine. Use 2 reliable forms of birth control together while you are taking this medicine and for 1 month after you stop taking this medicine. If you think that you might be pregnant talk to your doctor right away. Men must use a latex condom during sexual contact with a woman while taking this medicine and for 28 days after you stop taking this medicine. A latex condom is needed even if you have had a  vasectomy. Contact your doctor right away if your partner becomes pregnant. Do not donate sperm while taking this medicine and for 28 days after you stop taking this medicine. Do not give blood while taking the medicine and for 1 month after completion of treatment to avoid exposing pregnant women to the medicine through the donated blood. Talk to your doctor about your risk of cancer. You may be more at risk for certain types of cancers if you take this medicine. You may need blood work done while you are taking this medicine. What side effects may I notice from receiving this medicine? Side effects that you should report to your doctor or health care professional as soon as possible: -allergic reactions like skin rash, itching or hives, swelling of the face, lips, or tongue -bloody or dark, tarry stools -breathing problems -chest pain -dark urine -fever, infection, runny nose, or sore throat -pain in the legs -swelling of your hands, ankles or leg -unusual bleeding or bruising Side effects that usually do not require medical attention (report to your doctor or health care professional if they continue or are bothersome): -diarrhea -tiredness This list may not describe all possible side effects. Call your doctor for medical advice about side effects. You may report side effects to FDA at 1-800-FDA-1088. Where should I keep my medicine? Keep out of the reach of children. Store at room temperature between 15 and 30 degrees C (59 and 86 degrees F). Throw away any unused medicine after the expiration date. NOTE: This sheet is a summary. It may not cover all possible information. If you have questions about this medicine, talk to your doctor, pharmacist, or health care provider.  2013, Elsevier/Gold Standard. (08/29/2011 1:28:34 PM)

## 2013-04-08 ENCOUNTER — Ambulatory Visit (HOSPITAL_BASED_OUTPATIENT_CLINIC_OR_DEPARTMENT_OTHER): Payer: No Typology Code available for payment source

## 2013-04-08 ENCOUNTER — Other Ambulatory Visit: Payer: Self-pay | Admitting: Medical Oncology

## 2013-04-08 ENCOUNTER — Encounter: Payer: Self-pay | Admitting: Medical Oncology

## 2013-04-08 ENCOUNTER — Other Ambulatory Visit: Payer: Self-pay | Admitting: Internal Medicine

## 2013-04-08 VITALS — BP 134/74 | HR 78 | Temp 98.2°F

## 2013-04-08 DIAGNOSIS — C9000 Multiple myeloma not having achieved remission: Secondary | ICD-10-CM

## 2013-04-08 DIAGNOSIS — Z5112 Encounter for antineoplastic immunotherapy: Secondary | ICD-10-CM

## 2013-04-08 MED ORDER — DEXAMETHASONE SODIUM PHOSPHATE 20 MG/5ML IJ SOLN
40.0000 mg | Freq: Once | INTRAMUSCULAR | Status: AC
  Administered 2013-04-08: 40 mg via INTRAVENOUS

## 2013-04-08 MED ORDER — BORTEZOMIB CHEMO SQ INJECTION 3.5 MG (2.5MG/ML)
1.5000 mg/m2 | Freq: Once | INTRAMUSCULAR | Status: AC
  Administered 2013-04-08: 2.75 mg via SUBCUTANEOUS
  Filled 2013-04-08: qty 2.75

## 2013-04-08 MED ORDER — ONDANSETRON HCL 8 MG PO TABS
ORAL_TABLET | ORAL | Status: AC
  Filled 2013-04-08: qty 1

## 2013-04-08 MED ORDER — ONDANSETRON HCL 8 MG PO TABS
8.0000 mg | ORAL_TABLET | Freq: Once | ORAL | Status: AC
  Administered 2013-04-08: 8 mg via ORAL

## 2013-04-08 MED ORDER — DEXAMETHASONE 4 MG PO TABS: 40.0000 mg | ORAL_TABLET | ORAL | Status: DC

## 2013-04-08 MED ORDER — DEXAMETHASONE SODIUM PHOSPHATE 20 MG/5ML IJ SOLN
INTRAMUSCULAR | Status: AC
  Filled 2013-04-08: qty 10

## 2013-04-08 NOTE — Patient Instructions (Addendum)
[facility] Discharge Instructions for Patients Receiving Chemotherapy  Today you received the following chemotherapy agents Velcade.  To help prevent nausea and vomiting after your treatment, we encourage you to take your nausea medication as directed.    If you develop nausea and vomiting that is not controlled by your nausea medication, call the clinic.   BELOW ARE SYMPTOMS THAT SHOULD BE REPORTED IMMEDIATELY:  *FEVER GREATER THAN 100.5 F  *CHILLS WITH OR WITHOUT FEVER  NAUSEA AND VOMITING THAT IS NOT CONTROLLED WITH YOUR NAUSEA MEDICATION  *UNUSUAL SHORTNESS OF BREATH  *UNUSUAL BRUISING OR BLEEDING  TENDERNESS IN MOUTH AND THROAT WITH OR WITHOUT PRESENCE OF ULCERS  *URINARY PROBLEMS  *BOWEL PROBLEMS  UNUSUAL RASH Items with * indicate a potential emergency and should be followed up as soon as possible.  Feel free to call the clinic you have any questions or concerns. The clinic phone number is [phone].    

## 2013-04-08 NOTE — Telephone Encounter (Signed)
I spoke with Sherlene-wife to inform her that Dr. Rosie Fate is changing pt's  decadron from IV to oral. He is currently getting it in his IV each week but next Thurs he can take by mouth. I explained he will need to take dexamethasone 10 pills= 40 mg every Thurs. This way he will not have to have his port accessed each week. She is aware we call to [facility] outpt pharmacy. She voiced understanding and will call if she has any questions.

## 2013-04-08 NOTE — Progress Notes (Signed)
Under current therapy: CyBorD was until 03/23/2013.    Also instead of doing IV dex we will do oral dex 40 mg po weekly.

## 2013-04-08 NOTE — Progress Notes (Signed)
Under plan: Adjuntive therapy with monthly zometa was discussed on prior visits.  He has very poor dentition making him not a good candidate at this time due to increaased risk of ORN of the jaw.

## 2013-04-09 ENCOUNTER — Other Ambulatory Visit: Payer: Self-pay | Admitting: Internal Medicine

## 2013-04-09 ENCOUNTER — Telehealth: Payer: Self-pay | Admitting: *Deleted

## 2013-04-09 NOTE — Telephone Encounter (Signed)
Patient walked in to office today to speak with [name] about treatment changes of his Decadron from IV to PO. Dr Rosie Fate listened to patient concerns, discussed his cares and concerns and attempted to educate patient on current regimen, patient and Dr Rosie Fate decided he would change his steroids back to IV on days of Velcade.

## 2013-04-12 ENCOUNTER — Other Ambulatory Visit: Payer: Self-pay | Admitting: Certified Registered Nurse Anesthetist

## 2013-04-13 ENCOUNTER — Telehealth: Payer: Self-pay | Admitting: Internal Medicine

## 2013-04-13 LAB — UIFE/LIGHT CHAINS/TP QN, 24-HR UR
Alpha 1, Urine: DETECTED — AB
Alpha 2, Urine: DETECTED — AB
Free Kappa/Lambda Ratio: 15.09 ratio — ABNORMAL HIGH (ref 2.04–10.37)
Free Lambda Excretion/Day: 0.44 mg/d
Time: 24 hours
Total Protein, Urine-Ur/day: 9 mg/d — ABNORMAL LOW (ref 10–140)
Total Protein, Urine: 2.3 mg/dL

## 2013-04-13 NOTE — Telephone Encounter (Signed)
S/w the pt's wife and made her aware to let her husband know that we have changed his schedule and he will need to pick up another appt calendar when he comes in on 04/15/2013.

## 2013-04-15 ENCOUNTER — Ambulatory Visit (HOSPITAL_BASED_OUTPATIENT_CLINIC_OR_DEPARTMENT_OTHER): Payer: No Typology Code available for payment source

## 2013-04-15 ENCOUNTER — Telehealth: Payer: Self-pay | Admitting: Internal Medicine

## 2013-04-15 ENCOUNTER — Other Ambulatory Visit: Payer: Self-pay | Admitting: Medical Oncology

## 2013-04-15 VITALS — BP 124/83 | HR 65 | Temp 97.7°F | Resp 20

## 2013-04-15 DIAGNOSIS — Z5112 Encounter for antineoplastic immunotherapy: Secondary | ICD-10-CM

## 2013-04-15 DIAGNOSIS — C9000 Multiple myeloma not having achieved remission: Secondary | ICD-10-CM

## 2013-04-15 MED ORDER — BORTEZOMIB CHEMO SQ INJECTION 3.5 MG (2.5MG/ML)
1.5000 mg/m2 | Freq: Once | INTRAMUSCULAR | Status: AC
  Administered 2013-04-15: 2.75 mg via SUBCUTANEOUS
  Filled 2013-04-15: qty 2.75

## 2013-04-15 MED ORDER — ONDANSETRON HCL 8 MG PO TABS
ORAL_TABLET | ORAL | Status: AC
  Filled 2013-04-15: qty 1

## 2013-04-15 MED ORDER — ONDANSETRON HCL 8 MG PO TABS
8.0000 mg | ORAL_TABLET | Freq: Once | ORAL | Status: AC
  Administered 2013-04-15: 8 mg via ORAL

## 2013-04-15 MED ORDER — DEXAMETHASONE SODIUM PHOSPHATE 20 MG/5ML IJ SOLN
40.0000 mg | Freq: Once | INTRAMUSCULAR | Status: AC
  Administered 2013-04-15: 40 mg via INTRAVENOUS

## 2013-04-15 MED ORDER — DEXAMETHASONE SODIUM PHOSPHATE 20 MG/5ML IJ SOLN
INTRAMUSCULAR | Status: AC
  Filled 2013-04-15: qty 10

## 2013-04-15 NOTE — Telephone Encounter (Signed)
Gave pt appts  for lab with chemo for the whole month of October 2014

## 2013-04-15 NOTE — Patient Instructions (Addendum)

## 2013-04-22 ENCOUNTER — Other Ambulatory Visit: Payer: Self-pay | Admitting: *Deleted

## 2013-04-22 ENCOUNTER — Other Ambulatory Visit: Payer: Self-pay | Admitting: Medical Oncology

## 2013-04-22 ENCOUNTER — Ambulatory Visit (HOSPITAL_BASED_OUTPATIENT_CLINIC_OR_DEPARTMENT_OTHER): Payer: No Typology Code available for payment source

## 2013-04-22 ENCOUNTER — Other Ambulatory Visit: Payer: Self-pay | Admitting: Internal Medicine

## 2013-04-22 ENCOUNTER — Other Ambulatory Visit (HOSPITAL_BASED_OUTPATIENT_CLINIC_OR_DEPARTMENT_OTHER): Payer: Medicaid Other | Admitting: Lab

## 2013-04-22 VITALS — BP 130/80 | HR 63 | Temp 97.1°F

## 2013-04-22 DIAGNOSIS — C9000 Multiple myeloma not having achieved remission: Secondary | ICD-10-CM

## 2013-04-22 DIAGNOSIS — D472 Monoclonal gammopathy: Secondary | ICD-10-CM

## 2013-04-22 DIAGNOSIS — Z5112 Encounter for antineoplastic immunotherapy: Secondary | ICD-10-CM

## 2013-04-22 LAB — CBC WITH DIFFERENTIAL/PLATELET
Basophils Absolute: 0.1 10*3/uL (ref 0.0–0.1)
EOS%: 0.4 % (ref 0.0–7.0)
Eosinophils Absolute: 0 10*3/uL (ref 0.0–0.5)
HGB: 10.6 g/dL — ABNORMAL LOW (ref 13.0–17.1)
MCH: 33.8 pg — ABNORMAL HIGH (ref 27.2–33.4)
MONO%: 16.9 % — ABNORMAL HIGH (ref 0.0–14.0)
NEUT#: 2.3 10*3/uL (ref 1.5–6.5)
RBC: 3.14 10*6/uL — ABNORMAL LOW (ref 4.20–5.82)
RDW: 14 % (ref 11.0–14.6)
WBC: 4.6 10*3/uL (ref 4.0–10.3)
lymph#: 1.5 10*3/uL (ref 0.9–3.3)
nRBC: 0 % (ref 0–0)

## 2013-04-22 LAB — COMPREHENSIVE METABOLIC PANEL (CC13)
ALT: 20 U/L (ref 0–55)
AST: 21 U/L (ref 5–34)
Albumin: 3.4 g/dL — ABNORMAL LOW (ref 3.5–5.0)
Alkaline Phosphatase: 76 U/L (ref 40–150)
Anion Gap: 6 mEq/L (ref 3–11)
CO2: 23 mEq/L (ref 22–29)
Creatinine: 0.8 mg/dL (ref 0.7–1.3)
Glucose: 97 mg/dl (ref 70–140)
Potassium: 4.3 mEq/L (ref 3.5–5.1)
Sodium: 138 mEq/L (ref 136–145)
Total Bilirubin: 0.22 mg/dL (ref 0.20–1.20)
Total Protein: 7.4 g/dL (ref 6.4–8.3)

## 2013-04-22 MED ORDER — SODIUM CHLORIDE 0.9 % IJ SOLN
10.0000 mL | INTRAMUSCULAR | Status: DC | PRN
  Administered 2013-04-22: 10 mL via INTRAVENOUS
  Filled 2013-04-22: qty 10

## 2013-04-22 MED ORDER — LENALIDOMIDE 25 MG PO CAPS: 25.0000 mg | ORAL_CAPSULE | ORAL | Status: DC

## 2013-04-22 MED ORDER — BORTEZOMIB CHEMO SQ INJECTION 3.5 MG (2.5MG/ML)
1.5000 mg/m2 | Freq: Once | INTRAMUSCULAR | Status: AC
  Administered 2013-04-22: 2.75 mg via SUBCUTANEOUS
  Filled 2013-04-22: qty 2.75

## 2013-04-22 MED ORDER — DEXAMETHASONE SODIUM PHOSPHATE 20 MG/5ML IJ SOLN
INTRAMUSCULAR | Status: AC
  Filled 2013-04-22: qty 10

## 2013-04-22 MED ORDER — DEXAMETHASONE SODIUM PHOSPHATE 20 MG/5ML IJ SOLN
40.0000 mg | Freq: Once | INTRAMUSCULAR | Status: AC
  Administered 2013-04-22: 40 mg via INTRAVENOUS

## 2013-04-22 MED ORDER — SODIUM CHLORIDE 0.9 % IV SOLN
Freq: Once | INTRAVENOUS | Status: AC
  Administered 2013-04-22: 09:00:00 via INTRAVENOUS

## 2013-04-22 MED ORDER — HEPARIN SOD (PORK) LOCK FLUSH 100 UNIT/ML IV SOLN
500.0000 [IU] | Freq: Once | INTRAVENOUS | Status: AC
  Administered 2013-04-22: 500 [IU] via INTRAVENOUS
  Filled 2013-04-22: qty 5

## 2013-04-22 MED ORDER — ONDANSETRON HCL 8 MG PO TABS
ORAL_TABLET | ORAL | Status: AC
  Filled 2013-04-22: qty 1

## 2013-04-22 MED ORDER — ONDANSETRON HCL 8 MG PO TABS
8.0000 mg | ORAL_TABLET | Freq: Once | ORAL | Status: AC
  Administered 2013-04-22: 8 mg via ORAL

## 2013-04-22 NOTE — Patient Instructions (Addendum)
[facility] Discharge Instructions for Patients Receiving Chemotherapy  Today you received the following chemotherapy agents velcade.  To help prevent nausea and vomiting after your treatment, we encourage you to take your nausea medication.   If you develop nausea and vomiting that is not controlled by your nausea medication, call the clinic.   BELOW ARE SYMPTOMS THAT SHOULD BE REPORTED IMMEDIATELY:  *FEVER GREATER THAN 100.5 F  *CHILLS WITH OR WITHOUT FEVER  NAUSEA AND VOMITING THAT IS NOT CONTROLLED WITH YOUR NAUSEA MEDICATION  *UNUSUAL SHORTNESS OF BREATH  *UNUSUAL BRUISING OR BLEEDING  TENDERNESS IN MOUTH AND THROAT WITH OR WITHOUT PRESENCE OF ULCERS  *URINARY PROBLEMS  *BOWEL PROBLEMS  UNUSUAL RASH Items with * indicate a potential emergency and should be followed up as soon as possible.  Feel free to call the clinic you have any questions or concerns. The clinic phone number is [phone].    

## 2013-04-22 NOTE — Addendum Note (Signed)
Addended by: Arvilla Meres on: 04/22/2013 03:12 PM   Modules accepted: Orders

## 2013-04-22 NOTE — Telephone Encounter (Signed)
THIS REFILL REQUEST FOR REVLIMD WAS GIVEN TO DR.CHISM'S NURSE, ROBIN BASS,RN.

## 2013-04-22 NOTE — Telephone Encounter (Signed)
PT. TOOK HIS LAST REVLIMID ON 04/16/13. HE WANTED TO KNOW WHEN HE WOULD RECEIVE HIS NEXT SHIPMENT OF HIS MEDICATION. CALLED [employer] AND REQUESTED A REFILL FORM FOR PT.'S REVLIMID. GAVE PT. THE PHONE NUMBERS FOR [facility] AND [facility]. EXPLAINED TO PT.HE NEEDS TO CALL [facility] THE DAY HE TAKES HIS LAST PILL WHILE HE WILL BE ON HIS SEVEN DAY REST PERIOD TO REORDER HIS REVLIMID. ALSO EXPLAINED TO PT. THE NEED TO CALL [facility] TO TAKE HIS SURVEY WHILE HE IS ON HIS SEVEN DAY REST PERIOD. PT. VOICES UNDERSTANDING.

## 2013-04-28 ENCOUNTER — Telehealth: Payer: Self-pay | Admitting: *Deleted

## 2013-04-28 ENCOUNTER — Ambulatory Visit: Payer: No Typology Code available for payment source

## 2013-04-28 NOTE — Telephone Encounter (Signed)
Pt came to [facility] stating he had not received his Revlimid shipment. RN called [employer] and they stated his "free drug" had expired and they had been in touch with [facility] regarding refills. They will contact patient today or tomorrow regarding shipment. Pt informed that they will call him today or tomorrow.

## 2013-04-29 ENCOUNTER — Encounter (INDEPENDENT_AMBULATORY_CARE_PROVIDER_SITE_OTHER): Payer: Self-pay

## 2013-04-29 ENCOUNTER — Ambulatory Visit (HOSPITAL_BASED_OUTPATIENT_CLINIC_OR_DEPARTMENT_OTHER): Payer: No Typology Code available for payment source

## 2013-04-29 ENCOUNTER — Ambulatory Visit: Payer: No Typology Code available for payment source

## 2013-04-29 ENCOUNTER — Other Ambulatory Visit: Payer: Self-pay | Admitting: Internal Medicine

## 2013-04-29 ENCOUNTER — Other Ambulatory Visit (HOSPITAL_BASED_OUTPATIENT_CLINIC_OR_DEPARTMENT_OTHER): Payer: Medicaid Other | Admitting: Lab

## 2013-04-29 VITALS — BP 145/82 | HR 68 | Temp 99.5°F | Resp 18

## 2013-04-29 DIAGNOSIS — Z5112 Encounter for antineoplastic immunotherapy: Secondary | ICD-10-CM

## 2013-04-29 DIAGNOSIS — C9000 Multiple myeloma not having achieved remission: Secondary | ICD-10-CM

## 2013-04-29 LAB — COMPREHENSIVE METABOLIC PANEL (CC13)
Albumin: 3.5 g/dL (ref 3.5–5.0)
Alkaline Phosphatase: 70 U/L (ref 40–150)
Anion Gap: 7 mEq/L (ref 3–11)
CO2: 23 mEq/L (ref 22–29)
Glucose: 99 mg/dl (ref 70–140)
Sodium: 137 mEq/L (ref 136–145)
Total Protein: 7.9 g/dL (ref 6.4–8.3)

## 2013-04-29 LAB — CBC WITH DIFFERENTIAL/PLATELET
BASO%: 0.4 % (ref 0.0–2.0)
EOS%: 0.4 % (ref 0.0–7.0)
Eosinophils Absolute: 0 10*3/uL (ref 0.0–0.5)
HCT: 33.3 % — ABNORMAL LOW (ref 38.4–49.9)
HGB: 11.2 g/dL — ABNORMAL LOW (ref 13.0–17.1)
LYMPH%: 26.1 % (ref 14.0–49.0)
MCH: 32.9 pg (ref 27.2–33.4)
MCHC: 33.6 g/dL (ref 32.0–36.0)
MCV: 97.9 fL (ref 79.3–98.0)
MONO%: 9.6 % (ref 0.0–14.0)
NEUT#: 3 10*3/uL (ref 1.5–6.5)
NEUT%: 63.5 % (ref 39.0–75.0)
Platelets: 187 10*3/uL (ref 140–400)
RBC: 3.4 10*6/uL — ABNORMAL LOW (ref 4.20–5.82)
WBC: 4.7 10*3/uL (ref 4.0–10.3)
lymph#: 1.2 10*3/uL (ref 0.9–3.3)
nRBC: 0 % (ref 0–0)

## 2013-04-29 MED ORDER — ONDANSETRON HCL 8 MG PO TABS
ORAL_TABLET | ORAL | Status: AC
  Filled 2013-04-29: qty 1

## 2013-04-29 MED ORDER — DEXAMETHASONE SODIUM PHOSPHATE 20 MG/5ML IJ SOLN
40.0000 mg | Freq: Once | INTRAMUSCULAR | Status: AC
  Administered 2013-04-29: 40 mg via INTRAVENOUS

## 2013-04-29 MED ORDER — BORTEZOMIB CHEMO SQ INJECTION 3.5 MG (2.5MG/ML)
1.5000 mg/m2 | Freq: Once | INTRAMUSCULAR | Status: AC
  Administered 2013-04-29: 2.75 mg via SUBCUTANEOUS
  Filled 2013-04-29: qty 2.75

## 2013-04-29 MED ORDER — DEXAMETHASONE SODIUM PHOSPHATE 20 MG/5ML IJ SOLN
INTRAMUSCULAR | Status: AC
  Filled 2013-04-29: qty 10

## 2013-04-29 MED ORDER — ONDANSETRON HCL 8 MG PO TABS
8.0000 mg | ORAL_TABLET | Freq: Once | ORAL | Status: AC
  Administered 2013-04-29: 8 mg via ORAL

## 2013-04-29 NOTE — Patient Instructions (Signed)
[facility] Discharge Instructions for Patients Receiving Chemotherapy  Today you received the following chemotherapy agents: Velcade. To help prevent nausea and vomiting after your treatment, we encourage you to take your nausea medication.  If you develop nausea and vomiting that is not controlled by your nausea medication, call the clinic.   BELOW ARE SYMPTOMS THAT SHOULD BE REPORTED IMMEDIATELY:  *FEVER GREATER THAN 100.5 F  *CHILLS WITH OR WITHOUT FEVER  NAUSEA AND VOMITING THAT IS NOT CONTROLLED WITH YOUR NAUSEA MEDICATION  *UNUSUAL SHORTNESS OF BREATH  *UNUSUAL BRUISING OR BLEEDING  TENDERNESS IN MOUTH AND THROAT WITH OR WITHOUT PRESENCE OF ULCERS  *URINARY PROBLEMS  *BOWEL PROBLEMS  UNUSUAL RASH Items with * indicate a potential emergency and should be followed up as soon as possible.  Feel free to call the clinic you have any questions or concerns. The clinic phone number is [phone].    

## 2013-04-30 LAB — KAPPA/LAMBDA LIGHT CHAINS
Kappa free light chain: 2.08 mg/dL — ABNORMAL HIGH (ref 0.33–1.94)
Kappa:Lambda Ratio: 1.91 — ABNORMAL HIGH (ref 0.26–1.65)
Lambda Free Lght Chn: 1.09 mg/dL (ref 0.57–2.63)

## 2013-04-30 LAB — IGG: IgG (Immunoglobin G), Serum: 1510 mg/dL (ref 650–1600)

## 2013-04-30 NOTE — Telephone Encounter (Signed)
RECEIVED A FAX FROM [facility] CONCERNING A CONFIRMATION OF PRESCRIPTION SHIPMENT FOR REVLIMID ON 04/29/13.

## 2013-05-04 ENCOUNTER — Telehealth: Payer: Self-pay | Admitting: *Deleted

## 2013-05-04 NOTE — Telephone Encounter (Signed)
Patient walked in requesting the first day he was seen by a provider and the last day.  Information needed for a disability form.  1st date seen was 10-03-2008 and most recent visit was 04-07-2013.

## 2013-05-06 ENCOUNTER — Other Ambulatory Visit: Payer: Self-pay | Admitting: Internal Medicine

## 2013-05-06 ENCOUNTER — Ambulatory Visit (HOSPITAL_BASED_OUTPATIENT_CLINIC_OR_DEPARTMENT_OTHER): Payer: No Typology Code available for payment source

## 2013-05-06 ENCOUNTER — Other Ambulatory Visit (HOSPITAL_BASED_OUTPATIENT_CLINIC_OR_DEPARTMENT_OTHER): Payer: Medicaid Other | Admitting: Lab

## 2013-05-06 VITALS — BP 140/88 | HR 76 | Temp 98.8°F | Resp 18

## 2013-05-06 DIAGNOSIS — C9000 Multiple myeloma not having achieved remission: Secondary | ICD-10-CM

## 2013-05-06 DIAGNOSIS — Z5112 Encounter for antineoplastic immunotherapy: Secondary | ICD-10-CM

## 2013-05-06 DIAGNOSIS — D472 Monoclonal gammopathy: Secondary | ICD-10-CM

## 2013-05-06 LAB — CBC WITH DIFFERENTIAL/PLATELET
EOS%: 3 % (ref 0.0–7.0)
Eosinophils Absolute: 0.1 10*3/uL (ref 0.0–0.5)
MCH: 33 pg (ref 27.2–33.4)
MCHC: 33.3 g/dL (ref 32.0–36.0)
MCV: 99.1 fL — ABNORMAL HIGH (ref 79.3–98.0)
MONO%: 8.4 % (ref 0.0–14.0)
NEUT#: 3 10*3/uL (ref 1.5–6.5)
RBC: 3.48 10*6/uL — ABNORMAL LOW (ref 4.20–5.82)
RDW: 13.6 % (ref 11.0–14.6)
lymph#: 1.1 10*3/uL (ref 0.9–3.3)
nRBC: 0 % (ref 0–0)

## 2013-05-06 MED ORDER — ONDANSETRON HCL 8 MG PO TABS
8.0000 mg | ORAL_TABLET | Freq: Once | ORAL | Status: AC
  Administered 2013-05-06: 8 mg via ORAL

## 2013-05-06 MED ORDER — HEPARIN SOD (PORK) LOCK FLUSH 100 UNIT/ML IV SOLN
500.0000 [IU] | Freq: Once | INTRAVENOUS | Status: AC
  Administered 2013-05-06: 500 [IU] via INTRAVENOUS
  Filled 2013-05-06: qty 5

## 2013-05-06 MED ORDER — ONDANSETRON HCL 8 MG PO TABS
ORAL_TABLET | ORAL | Status: AC
  Filled 2013-05-06: qty 1

## 2013-05-06 MED ORDER — DEXAMETHASONE SODIUM PHOSPHATE 20 MG/5ML IJ SOLN
INTRAMUSCULAR | Status: AC
  Filled 2013-05-06: qty 10

## 2013-05-06 MED ORDER — DEXAMETHASONE SODIUM PHOSPHATE 20 MG/5ML IJ SOLN
40.0000 mg | Freq: Once | INTRAMUSCULAR | Status: AC
  Administered 2013-05-06: 40 mg via INTRAVENOUS

## 2013-05-06 MED ORDER — BORTEZOMIB CHEMO SQ INJECTION 3.5 MG (2.5MG/ML)
1.5000 mg/m2 | Freq: Once | INTRAMUSCULAR | Status: AC
  Administered 2013-05-06: 2.75 mg via SUBCUTANEOUS
  Filled 2013-05-06: qty 2.75

## 2013-05-06 MED ORDER — SODIUM CHLORIDE 0.9 % IJ SOLN
10.0000 mL | INTRAMUSCULAR | Status: DC | PRN
  Administered 2013-05-06: 10 mL via INTRAVENOUS
  Filled 2013-05-06: qty 10

## 2013-05-06 NOTE — Patient Instructions (Signed)
[facility] Discharge Instructions for Patients Receiving Chemotherapy  Today you received the following chemotherapy agents velcade.  To help prevent nausea and vomiting after your treatment, we encourage you to take your nausea medication as prescribed.   If you develop nausea and vomiting that is not controlled by your nausea medication, call the clinic.   BELOW ARE SYMPTOMS THAT SHOULD BE REPORTED IMMEDIATELY:  *FEVER GREATER THAN 100.5 F  *CHILLS WITH OR WITHOUT FEVER  NAUSEA AND VOMITING THAT IS NOT CONTROLLED WITH YOUR NAUSEA MEDICATION  *UNUSUAL SHORTNESS OF BREATH  *UNUSUAL BRUISING OR BLEEDING  TENDERNESS IN MOUTH AND THROAT WITH OR WITHOUT PRESENCE OF ULCERS  *URINARY PROBLEMS  *BOWEL PROBLEMS  UNUSUAL RASH Items with * indicate a potential emergency and should be followed up as soon as possible.  Feel free to call the clinic you have any questions or concerns. The clinic phone number is [phone].    

## 2013-05-11 ENCOUNTER — Ambulatory Visit (HOSPITAL_BASED_OUTPATIENT_CLINIC_OR_DEPARTMENT_OTHER): Payer: Medicaid Other | Admitting: Internal Medicine

## 2013-05-11 ENCOUNTER — Telehealth: Payer: Self-pay | Admitting: *Deleted

## 2013-05-11 ENCOUNTER — Other Ambulatory Visit: Payer: Self-pay | Admitting: Internal Medicine

## 2013-05-11 ENCOUNTER — Other Ambulatory Visit (HOSPITAL_BASED_OUTPATIENT_CLINIC_OR_DEPARTMENT_OTHER): Payer: Medicaid Other | Admitting: Lab

## 2013-05-11 ENCOUNTER — Ambulatory Visit (HOSPITAL_BASED_OUTPATIENT_CLINIC_OR_DEPARTMENT_OTHER): Payer: No Typology Code available for payment source

## 2013-05-11 VITALS — BP 148/87 | HR 65 | Temp 98.1°F | Resp 20 | Ht 73.5 in | Wt 177.6 lb

## 2013-05-11 DIAGNOSIS — M545 Low back pain, unspecified: Secondary | ICD-10-CM

## 2013-05-11 DIAGNOSIS — C61 Malignant neoplasm of prostate: Secondary | ICD-10-CM

## 2013-05-11 DIAGNOSIS — C9000 Multiple myeloma not having achieved remission: Secondary | ICD-10-CM

## 2013-05-11 DIAGNOSIS — R972 Elevated prostate specific antigen [PSA]: Secondary | ICD-10-CM

## 2013-05-11 DIAGNOSIS — D472 Monoclonal gammopathy: Secondary | ICD-10-CM

## 2013-05-11 DIAGNOSIS — Z5112 Encounter for antineoplastic immunotherapy: Secondary | ICD-10-CM

## 2013-05-11 LAB — CBC WITH DIFFERENTIAL/PLATELET
BASO%: 0.2 % (ref 0.0–2.0)
Basophils Absolute: 0 10*3/uL (ref 0.0–0.1)
Eosinophils Absolute: 0.1 10*3/uL (ref 0.0–0.5)
HCT: 34.6 % — ABNORMAL LOW (ref 38.4–49.9)
HGB: 11.5 g/dL — ABNORMAL LOW (ref 13.0–17.1)
LYMPH%: 20.2 % (ref 14.0–49.0)
MCH: 32.8 pg (ref 27.2–33.4)
MCHC: 33.2 g/dL (ref 32.0–36.0)
MONO#: 0.7 10*3/uL (ref 0.1–0.9)
MONO%: 12.7 % (ref 0.0–14.0)
NEUT#: 3.8 10*3/uL (ref 1.5–6.5)
Platelets: 203 10*3/uL (ref 140–400)
RDW: 13.6 % (ref 11.0–14.6)
WBC: 5.8 10*3/uL (ref 4.0–10.3)
lymph#: 1.2 10*3/uL (ref 0.9–3.3)
nRBC: 0 % (ref 0–0)

## 2013-05-11 MED ORDER — DEXAMETHASONE 4 MG PO TABS
ORAL_TABLET | ORAL | Status: AC
  Filled 2013-05-11: qty 10

## 2013-05-11 MED ORDER — DEXAMETHASONE SODIUM PHOSPHATE 20 MG/5ML IJ SOLN
INTRAMUSCULAR | Status: AC
  Filled 2013-05-11: qty 10

## 2013-05-11 MED ORDER — ONDANSETRON HCL 8 MG PO TABS
8.0000 mg | ORAL_TABLET | Freq: Once | ORAL | Status: AC
  Administered 2013-05-11: 8 mg via ORAL

## 2013-05-11 MED ORDER — ONDANSETRON HCL 8 MG PO TABS
ORAL_TABLET | ORAL | Status: AC
  Filled 2013-05-11: qty 1

## 2013-05-11 MED ORDER — DEXAMETHASONE 4 MG PO TABS
40.0000 mg | ORAL_TABLET | Freq: Once | ORAL | Status: AC
  Administered 2013-05-11: 40 mg via ORAL

## 2013-05-11 MED ORDER — BORTEZOMIB CHEMO SQ INJECTION 3.5 MG (2.5MG/ML)
1.5000 mg/m2 | Freq: Once | INTRAMUSCULAR | Status: AC
Start: 2013-05-11 — End: 2013-05-11
  Administered 2013-05-11: 2.75 mg via SUBCUTANEOUS
  Filled 2013-05-11: qty 2.75

## 2013-05-11 MED ORDER — DEXAMETHASONE SODIUM PHOSPHATE 20 MG/5ML IJ SOLN: 40.0000 mg | Freq: Once | INTRAMUSCULAR | Status: DC

## 2013-05-11 NOTE — Telephone Encounter (Signed)
Pt is aware that tx will be added. i emailed MW to add the tx...td 

## 2013-05-11 NOTE — Telephone Encounter (Signed)
Per staff message and POF I have scheduled appts.  JMW  

## 2013-05-11 NOTE — Patient Instructions (Signed)
Lenalidomide Oral Capsules What is this medicine? LENALIDOMIDE (len a LID oh mide) is used to treat anemia from a myelodysplastic syndrome. It is also used to treat multiple myeloma. The medicine may help you to need blood transfusions less often. This medicine may be used for other purposes; ask your health care provider or pharmacist if you have questions. What should I tell my health care provider before I take this medicine? They need to know if you have any of these conditions: -blood clots in the legs or the lungs -infection -irregular monthly periods or menstrual cycles -kidney disease -an unusual or allergic reaction to lenalidomide, other medicines, foods, dyes, or preservatives -pregnant or trying to get pregnant -breast-feeding How should I use this medicine? Take this medicine by mouth with a glass of water. Follow the directions on the prescription label. Do not cut, crush, or chew this medicine. Take your medicine at regular intervals. Do not take it more often than directed. Do not stop taking except on your doctor's advice. A MedGuide will be given with each prescription and refill. Read this guide carefully each time. The MedGuide may change frequently. Talk to your pediatrician regarding the use of this medicine in children. Special care may be needed. Overdosage: If you think you have taken too much of this medicine contact a poison control center or emergency room at once. NOTE: This medicine is only for you. Do not share this medicine with others. What if I miss a dose? If you miss a dose, take it as soon as you can. If your next dose is to be taken in less than 12 hours, then do not take the missed dose. Take the next dose at your regular time. Do not take double or extra doses. What may interact with this medicine? -vaccines This list may not describe all possible interactions. Give your health care provider a list of all the medicines, herbs, non-prescription drugs, or  dietary supplements you use. Also tell them if you smoke, drink alcohol, or use illegal drugs. Some items may interact with your medicine. What should I watch for while using this medicine? Visit your doctor for regular check ups. Tell your doctor or healthcare professional if your symptoms do not start to get better or if they get worse. You will need to have important blood work done while you are taking this medicine. This medicine is available only through a special program. Doctors, pharmacies, and patients must meet all of the conditions of the program. Your health care provider will help you get signed up with the program if you need this medicine. Through the program you will only receive up to a 28 day supply of the medicine at one time. You will need a new prescription for each refill. This medicine can cause birth defects. Do not get pregnant while taking this drug. Females with child-bearing potential will need to have 2 negative pregnancy tests before starting this medicine. Pregnancy testing must be done every 2 to 4 weeks as directed while taking this medicine. Use 2 reliable forms of birth control together while you are taking this medicine and for 1 month after you stop taking this medicine. If you think that you might be pregnant talk to your doctor right away. Men must use a latex condom during sexual contact with a woman while taking this medicine and for 28 days after you stop taking this medicine. A latex condom is needed even if you have had a vasectomy. Contact your doctor  right away if your partner becomes pregnant. Do not donate sperm while taking this medicine and for 28 days after you stop taking this medicine. Do not give blood while taking the medicine and for 1 month after completion of treatment to avoid exposing pregnant women to the medicine through the donated blood. Talk to your doctor about your risk of cancer. You may be more at risk for certain types of cancers if you  take this medicine. You may need blood work done while you are taking this medicine. What side effects may I notice from receiving this medicine? Side effects that you should report to your doctor or health care professional as soon as possible: -allergic reactions like skin rash, itching or hives, swelling of the face, lips, or tongue -bloody or dark, tarry stools -breathing problems -chest pain -dark urine -fever, infection, runny nose, or sore throat -pain in the legs -swelling of your hands, ankles or leg -unusual bleeding or bruising Side effects that usually do not require medical attention (report to your doctor or health care professional if they continue or are bothersome): -diarrhea -tiredness This list may not describe all possible side effects. Call your doctor for medical advice about side effects. You may report side effects to FDA at 1-800-FDA-1088. Where should I keep my medicine? Keep out of the reach of children. Store at room temperature between 15 and 30 degrees C (59 and 86 degrees F). Throw away any unused medicine after the expiration date. NOTE: This sheet is a summary. It may not cover all possible information. If you have questions about this medicine, talk to your doctor, pharmacist, or health care provider.  2013, [employer]. (08/29/2011 1:28:34 PM) Dexamethasone injection What is this medicine? DEXAMETHASONE (dex a METH a sone) is a corticosteroid. It is used to treat inflammation of the skin, joints, lungs, and other organs. Common conditions treated include asthma, allergies, and arthritis. It is also used for other conditions, like blood disorders and diseases of the adrenal glands. This medicine may be used for other purposes; ask your health care provider or pharmacist if you have questions. What should I tell my health care provider before I take this medicine? They need to know if you have any of these conditions: -blood clotting  problems -Cushing's syndrome -diabetes -glaucoma -heart problems or disease -high blood pressure -infection like herpes, measles, tuberculosis, or chickenpox -kidney disease -liver disease -mental problems -myasthenia gravis -osteoporosis -previous heart attack -seizures -stomach, ulcer or intestine disease including colitis and diverticulitis -thyroid problem -an unusual or allergic reaction to dexamethasone, corticosteroids, other medicines, lactose, foods, dyes, or preservatives -pregnant or trying to get pregnant -breast-feeding How should I use this medicine? This medicine is for injection into a muscle, joint, lesion, soft tissue, or vein. It is given by a health care professional in a hospital or clinic setting. Talk to your pediatrician regarding the use of this medicine in children. Special care may be needed. Overdosage: If you think you have taken too much of this medicine contact a poison control center or emergency room at once. NOTE: This medicine is only for you. Do not share this medicine with others. What if I miss a dose? This may not apply. If you are having a series of injections over a prolonged period, try not to miss an appointment. Call your doctor or health care professional to reschedule if you are unable to keep an appointment. What may interact with this medicine? Do not take this medicine with any  of the following medications: -mifepristone, RU-486 -vaccines This medicine may also interact with the following medications: -amphotericin B -antibiotics like clarithromycin, erythromycin, and troleandomycin -aspirin and aspirin-like drugs -barbiturates like phenobarbital -carbamazepine -cholestyramine -cholinesterase inhibitors like donepezil, galantamine, rivastigmine, and tacrine -cyclosporine -digoxin -diuretics -ephedrine -male hormones, like estrogens or progestins and birth control pills -indinavir -isoniazid -ketoconazole -medicines for  diabetes -medicines that improve muscle tone or strength for conditions like myasthenia gravis -NSAIDs, medicines for pain and inflammation, like ibuprofen or naproxen -phenytoin -rifampin -thalidomide -warfarin This list may not describe all possible interactions. Give your health care provider a list of all the medicines, herbs, non-prescription drugs, or dietary supplements you use. Also tell them if you smoke, drink alcohol, or use illegal drugs. Some items may interact with your medicine. What should I watch for while using this medicine? Your condition will be monitored carefully while you are receiving this medicine. If you are taking this medicine for a long time, carry an identification card with your name and address, the type and dose of your medicine, and your doctor's name and address. This medicine may increase your risk of getting an infection. Stay away from people who are sick. Tell your doctor or health care professional if you are around anyone with measles or chickenpox. Talk to your health care provider before you get any vaccines that you take this medicine. If you are going to have surgery, tell your doctor or health care professional that you have taken this medicine within the last twelve months. Ask your doctor or health care professional about your diet. You may need to lower the amount of salt you eat. The medicine can increase your blood sugar. If you are a diabetic check with your doctor if you need help adjusting the dose of your diabetic medicine. What side effects may I notice from receiving this medicine? Side effects that you should report to your doctor or health care professional as soon as possible: -allergic reactions like skin rash, itching or hives, swelling of the face, lips, or tongue -black or tarry stools -change in the amount of urine -changes in vision -confusion, excitement, restlessness, a false sense of well-being -fever, sore throat, sneezing,  cough, or other signs of infection, wounds that will not heal -hallucinations -increased thirst -mental depression, mood swings, mistaken feelings of self importance or of being mistreated -pain in hips, back, ribs, arms, shoulders, or legs -pain, redness, or irritation at the injection site -redness, blistering, peeling or loosening of the skin, including inside the mouth -rounding out of face -swelling of feet or lower legs -unusual bleeding or bruising -unusual tired or weak -wounds that do not heal Side effects that usually do not require medical attention (report to your doctor or health care professional if they continue or are bothersome): -diarrhea or constipation -change in taste -headache -nausea, vomiting -skin problems, acne, thin and shiny skin -touble sleeping -unusual growth of hair on the face or body -weight gain This list may not describe all possible side effects. Call your doctor for medical advice about side effects. You may report side effects to FDA at 1-800-FDA-1088. Where should I keep my medicine? This drug is given in a hospital or clinic and will not be stored at home. NOTE: This sheet is a summary. It may not cover all possible information. If you have questions about this medicine, talk to your doctor, pharmacist, or health care provider.  2012, [employer]. (10/22/2007 2:04:12 PM)Bortezomib injection What is this medicine? BORTEZOMIB (bor  TEZ oh mib) is a chemotherapy drug. It slows the growth of cancer cells. This medicine is used to treat multiple myeloma, lymphoma, and other cancers. This medicine may be used for other purposes; ask your health care provider or pharmacist if you have questions. What should I tell my health care provider before I take this medicine? They need to know if you have any of these conditions: -heart disease -irregular heartbeat -liver disease -low blood counts, like low white blood cells, platelets, or  hemoglobin -peripheral neuropathy -taking medicine for blood pressure -an unusual or allergic reaction to bortezomib, mannitol, boron, other medicines, foods, dyes, or preservatives -pregnant or trying to get pregnant -breast-feeding How should I use this medicine? This medicine is for injection into a vein or for injection under the skin. It is given by a health care professional in a hospital or clinic setting. Talk to your pediatrician regarding the use of this medicine in children. Special care may be needed. Overdosage: If you think you have taken too much of this medicine contact a poison control center or emergency room at once. NOTE: This medicine is only for you. Do not share this medicine with others. What if I miss a dose? It is important not to miss your dose. Call your doctor or health care professional if you are unable to keep an appointment. What may interact with this medicine? -medicines for diabetes -medicines to increase blood counts like filgrastim, pegfilgrastim, sargramostim -zalcitabine Talk to your doctor or health care professional before taking any of these medicines: -acetaminophen -aspirin -ibuprofen -ketoprofen -naproxen This list may not describe all possible interactions. Give your health care provider a list of all the medicines, herbs, non-prescription drugs, or dietary supplements you use. Also tell them if you smoke, drink alcohol, or use illegal drugs. Some items may interact with your medicine. What should I watch for while using this medicine? Visit your doctor for checks on your progress. This drug may make you feel generally unwell. This is not uncommon, as chemotherapy can affect healthy cells as well as cancer cells. Report any side effects. Continue your course of treatment even though you feel ill unless your doctor tells you to stop. You may get drowsy or dizzy. Do not drive, use machinery, or do anything that needs mental alertness until you  know how this medicine affects you. Do not stand or sit up quickly, especially if you are an older patient. This reduces the risk of dizzy or fainting spells. In some cases, you may be given additional medicines to help with side effects. Follow all directions for their use. Call your doctor or health care professional for advice if you get a fever, chills or sore throat, or other symptoms of a cold or flu. Do not treat yourself. This drug decreases your body's ability to fight infections. Try to avoid being around people who are sick. This medicine may increase your risk to bruise or bleed. Call your doctor or health care professional if you notice any unusual bleeding. Be careful brushing and flossing your teeth or using a toothpick because you may get an infection or bleed more easily. If you have any dental work done, tell your dentist you are receiving this medicine. Avoid taking products that contain aspirin, acetaminophen, ibuprofen, naproxen, or ketoprofen unless instructed by your doctor. These medicines may hide a fever. Do not become pregnant while taking this medicine. Women should inform their doctor if they wish to become pregnant or think they might be  pregnant. There is a potential for serious side effects to an unborn child. Talk to your health care professional or pharmacist for more information. Do not breast-feed an infant while taking this medicine. You may have vomiting or diarrhea while taking this medicine. Drink water or other fluids as directed. What side effects may I notice from receiving this medicine? Side effects that you should report to your doctor or health care professional as soon as possible: -allergic reactions like skin rash, itching or hives, swelling of the face, lips, or tongue -breathing problems -changes in hearing -changes in vision -fast, irregular heartbeat -feeling faint or lightheaded, falls -pain, tingling, numbness in the hands or  feet -seizures -swelling of the ankles, feet, hands -unusual bleeding or bruising -unusually weak or tired -vomiting Side effects that usually do not require medical attention (report to your doctor or health care professional if they continue or are bothersome): -changes in emotions or moods -constipation -diarrhea -loss of appetite -headache -irritation at site where injected -nausea This list may not describe all possible side effects. Call your doctor for medical advice about side effects. You may report side effects to FDA at 1-800-FDA-1088. Where should I keep my medicine? This drug is given in a hospital or clinic and will not be stored at home. NOTE: This sheet is a summary. It may not cover all possible information. If you have questions about this medicine, talk to your doctor, pharmacist, or health care provider.  2013, [employer]. (08/08/2010 11:42:36 AM) Multiple Myeloma Multiple myeloma is the most common cancer of bone. It is caused by the uncontrolled multiplication of a type of white blood cell in the marrow. This white blood cell is called a plasma cell. This means the bone marrow is overworking producing plasma cells. Soon these overproduced cells begin to take up room in the marrow that is needed by other cells. This means that there are soon not enough red or white blood cells or platelets. Not enough red cells mean that the person is anemic. There are not enough red blood cells to carry oxygen around the body. There are not enough white blood cells to fight disease. This causes the person with multiple myeloma to not feel well. There is also bone pain through much of the body. SYMPTOMS  Anemia causes fatigue (tiredness) and weakness.  Back pain is common. This is from fractures (break in bones) caused by damage to the bones of the back.  Lack of white blood cells makes infection more likely.  Bleeding is a common problem from lack of the cells  (platelets). Platelets help blood clots form. This may show up as bleeding from any place. Commonly this shows up as bleeding from the nose or gums.  Fractures (bone breaks) are more common anywhere. The back and ribs are the most commonly fractured areas. DIAGNOSIS  This tumor is often suggested by blood tests. Often doing a bone marrow sample makes the diagnosis (learning what is wrong). This is a test performed by taking a small sample of bone with a small needle. This bone often comes from the sternum (breast bone). This sample is sent to a pathologist (a specialist in looking at tissue under a microscope). After looking at the sample under the microscope, the pathologist is able to make a diagnosis of the problem. X-rays may also show boney changes. TREATMENT   Occasionally, anti-cancer medications may be used with multiple myeloma. Your caregiver can discuss this with you.  Medications can also be  given to help with the bone pain.  There is no cure for multiple myeloma. Lifestyle changes can add years of quality living. HOME CARE INSTRUCTIONS  Often there is no specific treatment for multiple myeloma. Most of the treatment consists of adjustments in dietary and living activities. Some of these changes include:  Your dietitian or caregiver helping you with your dietary questions.  Taking iron and vitamins as prescribed by your caregiver.  Eating a well balanced diet.  Staying active, but follow restrictions suggested by your caregiver. Avoiding heavy lifting (more than 10 pounds) and activities that cause increased pain.  Drinking plenty of water.  Using back braces and a cane may help with some of the boney pain. SEEK IMMEDIATE MEDICAL CARE IF:  You develop severe, uncontrolled boney pain.  You or your family notices confusion, problems with decision-making or inability to stay awake.  You notice increased urination or constipation.  You notice problems holding your water or  stool.  You have numbness or loss of control of your extremities (arms/hands or legs/feet). Document Released: 03/26/2001 Document Revised: 09/23/2011 Document Reviewed: 06/26/2008 [facility] Patient Information 2014 [place], [place].

## 2013-05-11 NOTE — Telephone Encounter (Signed)
appts made and printed...td 
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
## 2013-05-18 ENCOUNTER — Other Ambulatory Visit (HOSPITAL_BASED_OUTPATIENT_CLINIC_OR_DEPARTMENT_OTHER): Payer: Medicaid Other | Admitting: Lab

## 2013-05-18 ENCOUNTER — Ambulatory Visit (HOSPITAL_BASED_OUTPATIENT_CLINIC_OR_DEPARTMENT_OTHER): Payer: No Typology Code available for payment source

## 2013-05-18 VITALS — BP 114/73 | HR 70 | Temp 97.9°F

## 2013-05-18 DIAGNOSIS — C9000 Multiple myeloma not having achieved remission: Secondary | ICD-10-CM

## 2013-05-18 DIAGNOSIS — Z5112 Encounter for antineoplastic immunotherapy: Secondary | ICD-10-CM

## 2013-05-18 LAB — CBC WITH DIFFERENTIAL/PLATELET
Basophils Absolute: 0 10*3/uL (ref 0.0–0.1)
EOS%: 1.6 % (ref 0.0–7.0)
Eosinophils Absolute: 0.1 10*3/uL (ref 0.0–0.5)
HCT: 33.3 % — ABNORMAL LOW (ref 38.4–49.9)
HGB: 11 g/dL — ABNORMAL LOW (ref 13.0–17.1)
MCH: 32.7 pg (ref 27.2–33.4)
MONO%: 15 % — ABNORMAL HIGH (ref 0.0–14.0)
NEUT#: 2.2 10*3/uL (ref 1.5–6.5)
NEUT%: 60.9 % (ref 39.0–75.0)
RDW: 14.3 % (ref 11.0–14.6)
WBC: 3.7 10*3/uL — ABNORMAL LOW (ref 4.0–10.3)
lymph#: 0.8 10*3/uL — ABNORMAL LOW (ref 0.9–3.3)

## 2013-05-18 MED ORDER — ONDANSETRON HCL 8 MG PO TABS
ORAL_TABLET | ORAL | Status: AC
  Filled 2013-05-18: qty 1

## 2013-05-18 MED ORDER — DEXAMETHASONE 4 MG PO TABS
ORAL_TABLET | ORAL | Status: AC
  Filled 2013-05-18: qty 10

## 2013-05-18 MED ORDER — DEXAMETHASONE 4 MG PO TABS
40.0000 mg | ORAL_TABLET | Freq: Once | ORAL | Status: AC
  Administered 2013-05-18: 40 mg via ORAL

## 2013-05-18 MED ORDER — ONDANSETRON HCL 8 MG PO TABS
8.0000 mg | ORAL_TABLET | Freq: Once | ORAL | Status: AC
  Administered 2013-05-18: 8 mg via ORAL

## 2013-05-18 MED ORDER — BORTEZOMIB CHEMO SQ INJECTION 3.5 MG (2.5MG/ML)
1.5000 mg/m2 | Freq: Once | INTRAMUSCULAR | Status: AC
Start: 2013-05-18 — End: 2013-05-18
  Administered 2013-05-18: 2.75 mg via SUBCUTANEOUS
  Filled 2013-05-18: qty 2.75

## 2013-05-19 ENCOUNTER — Other Ambulatory Visit: Payer: Self-pay | Admitting: Internal Medicine

## 2013-05-21 ENCOUNTER — Other Ambulatory Visit: Payer: Self-pay | Admitting: *Deleted

## 2013-05-21 DIAGNOSIS — C9000 Multiple myeloma not having achieved remission: Secondary | ICD-10-CM

## 2013-05-21 MED ORDER — LENALIDOMIDE 25 MG PO CAPS: 25.0000 mg | ORAL_CAPSULE | ORAL | Status: DC

## 2013-05-25 ENCOUNTER — Ambulatory Visit (HOSPITAL_BASED_OUTPATIENT_CLINIC_OR_DEPARTMENT_OTHER): Payer: No Typology Code available for payment source

## 2013-05-25 ENCOUNTER — Other Ambulatory Visit (HOSPITAL_BASED_OUTPATIENT_CLINIC_OR_DEPARTMENT_OTHER): Payer: Medicaid Other | Admitting: Lab

## 2013-05-25 ENCOUNTER — Other Ambulatory Visit: Payer: Self-pay | Admitting: Internal Medicine

## 2013-05-25 VITALS — BP 145/84 | HR 70 | Temp 98.2°F

## 2013-05-25 DIAGNOSIS — C9000 Multiple myeloma not having achieved remission: Secondary | ICD-10-CM

## 2013-05-25 DIAGNOSIS — Z5112 Encounter for antineoplastic immunotherapy: Secondary | ICD-10-CM

## 2013-05-25 LAB — COMPREHENSIVE METABOLIC PANEL (CC13)
ALT: 23 U/L (ref 0–55)
AST: 19 U/L (ref 5–34)
Albumin: 3.8 g/dL (ref 3.5–5.0)
Alkaline Phosphatase: 73 U/L (ref 40–150)
Anion Gap: 10 meq/L (ref 3–11)
BUN: 11.6 mg/dL (ref 7.0–26.0)
CO2: 25 meq/L (ref 22–29)
Calcium: 9.6 mg/dL (ref 8.4–10.4)
Chloride: 102 meq/L (ref 98–109)
Creatinine: 0.9 mg/dL (ref 0.7–1.3)
Glucose: 89 mg/dL (ref 70–140)
Potassium: 3.9 meq/L (ref 3.5–5.1)
Sodium: 137 meq/L (ref 136–145)
Total Bilirubin: 0.57 mg/dL (ref 0.20–1.20)
Total Protein: 7.2 g/dL (ref 6.4–8.3)

## 2013-05-25 LAB — CBC WITH DIFFERENTIAL/PLATELET
BASO%: 0.6 % (ref 0.0–2.0)
Basophils Absolute: 0 10e3/uL (ref 0.0–0.1)
EOS%: 1.3 % (ref 0.0–7.0)
Eosinophils Absolute: 0.1 10e3/uL (ref 0.0–0.5)
HCT: 36.2 % — ABNORMAL LOW (ref 38.4–49.9)
HGB: 11.7 g/dL — ABNORMAL LOW (ref 13.0–17.1)
LYMPH%: 18.4 % (ref 14.0–49.0)
MCH: 31.9 pg (ref 27.2–33.4)
MCHC: 32.2 g/dL (ref 32.0–36.0)
MCV: 99.1 fL — ABNORMAL HIGH (ref 79.3–98.0)
MONO#: 0.7 10e3/uL (ref 0.1–0.9)
MONO%: 12.8 % (ref 0.0–14.0)
NEUT#: 3.6 10e3/uL (ref 1.5–6.5)
NEUT%: 66.9 % (ref 39.0–75.0)
Platelets: 192 10e3/uL (ref 140–400)
RBC: 3.66 10e6/uL — ABNORMAL LOW (ref 4.20–5.82)
RDW: 14.4 % (ref 11.0–14.6)
WBC: 5.3 10e3/uL (ref 4.0–10.3)
lymph#: 1 10e3/uL (ref 0.9–3.3)

## 2013-05-25 MED ORDER — DEXAMETHASONE 4 MG PO TABS
ORAL_TABLET | ORAL | Status: AC
  Filled 2013-05-25: qty 10

## 2013-05-25 MED ORDER — DEXAMETHASONE 4 MG PO TABS
40.0000 mg | ORAL_TABLET | Freq: Once | ORAL | Status: AC
  Administered 2013-05-25: 40 mg via ORAL

## 2013-05-25 MED ORDER — ONDANSETRON HCL 8 MG PO TABS
ORAL_TABLET | ORAL | Status: AC
  Filled 2013-05-25: qty 1

## 2013-05-25 MED ORDER — ONDANSETRON HCL 8 MG PO TABS
8.0000 mg | ORAL_TABLET | Freq: Once | ORAL | Status: AC
  Administered 2013-05-25: 8 mg via ORAL

## 2013-05-25 MED ORDER — BORTEZOMIB CHEMO SQ INJECTION 3.5 MG (2.5MG/ML)
1.5000 mg/m2 | Freq: Once | INTRAMUSCULAR | Status: AC
  Administered 2013-05-25: 3 mg via SUBCUTANEOUS
  Filled 2013-05-25: qty 3

## 2013-05-25 NOTE — Telephone Encounter (Signed)
RECEIVED A FAX FROM [facility] CONCERNING A CONFIRMATION OF PRESCRIPTION SHIPMENT FOR REVLIMID ON 05/24/13.

## 2013-05-25 NOTE — Patient Instructions (Signed)
[facility] Discharge Instructions for Patients Receiving Chemotherapy  Today you received the following chemotherapy agent Velcade injection.  To help prevent nausea and vomiting after your treatment, we encourage you to take your nausea medication.   If you develop nausea and vomiting that is not controlled by your nausea medication, call the clinic.   BELOW ARE SYMPTOMS THAT SHOULD BE REPORTED IMMEDIATELY:  *FEVER GREATER THAN 100.5 F  *CHILLS WITH OR WITHOUT FEVER  NAUSEA AND VOMITING THAT IS NOT CONTROLLED WITH YOUR NAUSEA MEDICATION  *UNUSUAL SHORTNESS OF BREATH  *UNUSUAL BRUISING OR BLEEDING  TENDERNESS IN MOUTH AND THROAT WITH OR WITHOUT PRESENCE OF ULCERS  *URINARY PROBLEMS  *BOWEL PROBLEMS  UNUSUAL RASH Items with * indicate a potential emergency and should be followed up as soon as possible.  Feel free to call the clinic you have any questions or concerns. The clinic phone number is [phone].   Bortezomib injection What is this medicine? BORTEZOMIB (bor TEZ oh mib) is a chemotherapy drug. It slows the growth of cancer cells. This medicine is used to treat multiple myeloma, lymphoma, and other cancers. This medicine may be used for other purposes; ask your health care provider or pharmacist if you have questions. COMMON BRAND NAME(S): Velcade What should I tell my health care provider before I take this medicine? They need to know if you have any of these conditions: -heart disease -irregular heartbeat -liver disease -low blood counts, like low white blood cells, platelets, or hemoglobin -peripheral neuropathy -taking medicine for blood pressure -an unusual or allergic reaction to bortezomib, mannitol, boron, other medicines, foods, dyes, or preservatives -pregnant or trying to get pregnant -breast-feeding How should I use this medicine? This medicine is for injection into a vein or for injection under the skin. It is given by a health  care professional in a hospital or clinic setting. Talk to your pediatrician regarding the use of this medicine in children. Special care may be needed. Overdosage: If you think you have taken too much of this medicine contact a poison control center or emergency room at once. NOTE: This medicine is only for you. Do not share this medicine with others. What if I miss a dose? It is important not to miss your dose. Call your doctor or health care professional if you are unable to keep an appointment. What may interact with this medicine? -medicines for diabetes -medicines to increase blood counts like filgrastim, pegfilgrastim, sargramostim -zalcitabine Talk to your doctor or health care professional before taking any of these medicines: -acetaminophen -aspirin -ibuprofen -ketoprofen -naproxen This list may not describe all possible interactions. Give your health care provider a list of all the medicines, herbs, non-prescription drugs, or dietary supplements you use. Also tell them if you smoke, drink alcohol, or use illegal drugs. Some items may interact with your medicine. What should I watch for while using this medicine? Visit your doctor for checks on your progress. This drug may make you feel generally unwell. This is not uncommon, as chemotherapy can affect healthy cells as well as cancer cells. Report any side effects. Continue your course of treatment even though you feel ill unless your doctor tells you to stop. You may get drowsy or dizzy. Do not drive, use machinery, or do anything that needs mental alertness until you know how this medicine affects you. Do not stand or sit up quickly, especially if you are an older patient. This reduces the risk of dizzy or fainting spells. In some   cases, you may be given additional medicines to help with side effects. Follow all directions for their use. Call your doctor or health care professional for advice if you get a fever, chills or sore  throat, or other symptoms of a cold or flu. Do not treat yourself. This drug decreases your body's ability to fight infections. Try to avoid being around people who are sick. This medicine may increase your risk to bruise or bleed. Call your doctor or health care professional if you notice any unusual bleeding. Be careful brushing and flossing your teeth or using a toothpick because you may get an infection or bleed more easily. If you have any dental work done, tell your dentist you are receiving this medicine. Avoid taking products that contain aspirin, acetaminophen, ibuprofen, naproxen, or ketoprofen unless instructed by your doctor. These medicines may hide a fever. Do not become pregnant while taking this medicine. Women should inform their doctor if they wish to become pregnant or think they might be pregnant. There is a potential for serious side effects to an unborn child. Talk to your health care professional or pharmacist for more information. Do not breast-feed an infant while taking this medicine. You may have vomiting or diarrhea while taking this medicine. Drink water or other fluids as directed. What side effects may I notice from receiving this medicine? Side effects that you should report to your doctor or health care professional as soon as possible: -allergic reactions like skin rash, itching or hives, swelling of the face, lips, or tongue -breathing problems -changes in hearing -changes in vision -fast, irregular heartbeat -feeling faint or lightheaded, falls -pain, tingling, numbness in the hands or feet -seizures -swelling of the ankles, feet, hands -unusual bleeding or bruising -unusually weak or tired -vomiting Side effects that usually do not require medical attention (report to your doctor or health care professional if they continue or are bothersome): -changes in emotions or moods -constipation -diarrhea -loss of appetite -headache -irritation at site where  injected -nausea This list may not describe all possible side effects. Call your doctor for medical advice about side effects. You may report side effects to FDA at 1-800-FDA-1088. Where should I keep my medicine? This drug is given in a hospital or clinic and will not be stored at home. NOTE: This sheet is a summary. It may not cover all possible information. If you have questions about this medicine, talk to your doctor, pharmacist, or health care provider.  2014, Elsevier/Gold Standard. (2010-08-08 11:42:36)  

## 2013-05-27 ENCOUNTER — Emergency Department (HOSPITAL_COMMUNITY)
Admission: EM | Admit: 2013-05-27 | Discharge: 2013-05-27 | Disposition: A | Discharge status: Home / Self Care | Payer: No Typology Code available for payment source | Attending: Emergency Medicine | Admitting: Emergency Medicine

## 2013-05-27 ENCOUNTER — Encounter (HOSPITAL_COMMUNITY): Payer: Self-pay | Admitting: Emergency Medicine

## 2013-05-27 DIAGNOSIS — Z79899 Other long term (current) drug therapy: Secondary | ICD-10-CM | POA: Insufficient documentation

## 2013-05-27 DIAGNOSIS — J309 Allergic rhinitis, unspecified: Secondary | ICD-10-CM | POA: Insufficient documentation

## 2013-05-27 DIAGNOSIS — K006 Disturbances in tooth eruption: Secondary | ICD-10-CM | POA: Insufficient documentation

## 2013-05-27 DIAGNOSIS — R011 Cardiac murmur, unspecified: Secondary | ICD-10-CM | POA: Insufficient documentation

## 2013-05-27 DIAGNOSIS — Z8701 Personal history of pneumonia (recurrent): Secondary | ICD-10-CM | POA: Insufficient documentation

## 2013-05-27 DIAGNOSIS — Z8719 Personal history of other diseases of the digestive system: Secondary | ICD-10-CM | POA: Insufficient documentation

## 2013-05-27 DIAGNOSIS — C9000 Multiple myeloma not having achieved remission: Secondary | ICD-10-CM | POA: Insufficient documentation

## 2013-05-27 DIAGNOSIS — H1045 Other chronic allergic conjunctivitis: Secondary | ICD-10-CM | POA: Insufficient documentation

## 2013-05-27 DIAGNOSIS — I10 Essential (primary) hypertension: Secondary | ICD-10-CM | POA: Insufficient documentation

## 2013-05-27 DIAGNOSIS — C439 Malignant melanoma of skin, unspecified: Secondary | ICD-10-CM | POA: Insufficient documentation

## 2013-05-27 DIAGNOSIS — Z8546 Personal history of malignant neoplasm of prostate: Secondary | ICD-10-CM | POA: Insufficient documentation

## 2013-05-27 DIAGNOSIS — Z7982 Long term (current) use of aspirin: Secondary | ICD-10-CM | POA: Insufficient documentation

## 2013-05-27 DIAGNOSIS — H1013 Acute atopic conjunctivitis, bilateral: Secondary | ICD-10-CM

## 2013-05-27 DIAGNOSIS — M1990 Unspecified osteoarthritis, unspecified site: Secondary | ICD-10-CM | POA: Insufficient documentation

## 2013-05-27 DIAGNOSIS — Z862 Personal history of diseases of the blood and blood-forming organs and certain disorders involving the immune mechanism: Secondary | ICD-10-CM | POA: Insufficient documentation

## 2013-05-27 MED ORDER — LORATADINE 10 MG PO TABS: 10.0000 mg | ORAL_TABLET | Freq: Every day | ORAL | Status: DC

## 2013-05-27 NOTE — Progress Notes (Deleted)
[id number]
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
## 2013-05-27 NOTE — ED Notes (Signed)
Patient claims running nose and eyes for about three weeks with no relief. Throat congestion during the day.

## 2013-05-30 ENCOUNTER — Emergency Department (HOSPITAL_COMMUNITY)
Admission: EM | Admit: 2013-05-30 | Discharge: 2013-05-30 | Disposition: A | Discharge status: Home / Self Care | Payer: No Typology Code available for payment source | Attending: Emergency Medicine | Admitting: Emergency Medicine

## 2013-05-30 ENCOUNTER — Encounter (HOSPITAL_COMMUNITY): Payer: Self-pay | Admitting: Emergency Medicine

## 2013-05-30 ENCOUNTER — Emergency Department (HOSPITAL_COMMUNITY): Payer: No Typology Code available for payment source

## 2013-05-30 DIAGNOSIS — Z862 Personal history of diseases of the blood and blood-forming organs and certain disorders involving the immune mechanism: Secondary | ICD-10-CM | POA: Insufficient documentation

## 2013-05-30 DIAGNOSIS — Z8719 Personal history of other diseases of the digestive system: Secondary | ICD-10-CM | POA: Insufficient documentation

## 2013-05-30 DIAGNOSIS — H6120 Impacted cerumen, unspecified ear: Secondary | ICD-10-CM | POA: Insufficient documentation

## 2013-05-30 DIAGNOSIS — Z7982 Long term (current) use of aspirin: Secondary | ICD-10-CM | POA: Insufficient documentation

## 2013-05-30 DIAGNOSIS — Z8579 Personal history of other malignant neoplasms of lymphoid, hematopoietic and related tissues: Secondary | ICD-10-CM

## 2013-05-30 DIAGNOSIS — C61 Malignant neoplasm of prostate: Secondary | ICD-10-CM | POA: Insufficient documentation

## 2013-05-30 DIAGNOSIS — C439 Malignant melanoma of skin, unspecified: Secondary | ICD-10-CM | POA: Insufficient documentation

## 2013-05-30 DIAGNOSIS — Z79899 Other long term (current) drug therapy: Secondary | ICD-10-CM | POA: Insufficient documentation

## 2013-05-30 DIAGNOSIS — I10 Essential (primary) hypertension: Secondary | ICD-10-CM | POA: Insufficient documentation

## 2013-05-30 DIAGNOSIS — J069 Acute upper respiratory infection, unspecified: Secondary | ICD-10-CM

## 2013-05-30 DIAGNOSIS — Z8701 Personal history of pneumonia (recurrent): Secondary | ICD-10-CM | POA: Insufficient documentation

## 2013-05-30 DIAGNOSIS — J019 Acute sinusitis, unspecified: Secondary | ICD-10-CM | POA: Insufficient documentation

## 2013-05-30 DIAGNOSIS — R011 Cardiac murmur, unspecified: Secondary | ICD-10-CM | POA: Insufficient documentation

## 2013-05-30 DIAGNOSIS — C9000 Multiple myeloma not having achieved remission: Secondary | ICD-10-CM | POA: Insufficient documentation

## 2013-05-30 DIAGNOSIS — M129 Arthropathy, unspecified: Secondary | ICD-10-CM | POA: Insufficient documentation

## 2013-05-30 LAB — BASIC METABOLIC PANEL
BUN: 9 mg/dL (ref 6–23)
CO2: 23 mEq/L (ref 19–32)
Calcium: 9.2 mg/dL (ref 8.4–10.5)
Creatinine, Ser: 0.78 mg/dL (ref 0.50–1.35)
GFR calc non Af Amer: >90 mL/min (ref >90)
Glucose, Bld: 103 mg/dL — ABNORMAL HIGH (ref 70–99)
Sodium: 134 mEq/L — ABNORMAL LOW (ref 135–145)

## 2013-05-30 LAB — CBC WITH DIFFERENTIAL/PLATELET
Basophils Relative: 0 % (ref 0–1)
Eosinophils Absolute: 0.1 10*3/uL (ref 0.0–0.7)
Eosinophils Relative: 1 % (ref 0–5)
HCT: 33.9 % — ABNORMAL LOW (ref 39.0–52.0)
Lymphocytes Relative: 16 % (ref 12–46)
Lymphs Abs: 1.1 10*3/uL (ref 0.7–4.0)
MCHC: 33.6 g/dL (ref 30.0–36.0)
MCV: 96.3 fL (ref 78.0–100.0)
Monocytes Absolute: 1.2 10*3/uL — ABNORMAL HIGH (ref 0.1–1.0)
Monocytes Relative: 17 % — ABNORMAL HIGH (ref 3–12)
RBC: 3.52 MIL/uL — ABNORMAL LOW (ref 4.22–5.81)
WBC: 7 10*3/uL (ref 4.0–10.5)

## 2013-05-30 MED ORDER — AZITHROMYCIN 250 MG PO TABS: 250.0000 mg | ORAL_TABLET | Freq: Every day | ORAL | Status: DC

## 2013-05-30 MED ORDER — GUAIFENESIN 100 MG/5ML PO LIQD: 100.0000 mg | ORAL | Status: DC | PRN

## 2013-05-30 NOTE — ED Notes (Signed)
Pt reports hx of prostate and melanoma cancer, last chemo treatment last Tuesday. Pt seen and treated in ED on 11/13 for runny eyes and nose x3 weeks. Pt reports no relief and feels weaker. Denies pain. Productive cough yellow and brown sputum.
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
## 2013-05-31 ENCOUNTER — Encounter: Payer: Self-pay | Admitting: Medical Oncology

## 2013-05-31 ENCOUNTER — Other Ambulatory Visit: Payer: Self-pay | Admitting: Internal Medicine

## 2013-05-31 ENCOUNTER — Other Ambulatory Visit: Payer: Self-pay | Admitting: *Deleted

## 2013-05-31 DIAGNOSIS — C9000 Multiple myeloma not having achieved remission: Secondary | ICD-10-CM

## 2013-05-31 MED ORDER — INFLUENZA VAC SPLIT QUAD 0.5 ML IM SUSP
0.5000 mL | Freq: Once | INTRAMUSCULAR | Status: DC
  Filled 2013-05-31: qty 0.5

## 2013-06-01 ENCOUNTER — Other Ambulatory Visit (HOSPITAL_BASED_OUTPATIENT_CLINIC_OR_DEPARTMENT_OTHER): Payer: Medicaid Other

## 2013-06-01 ENCOUNTER — Ambulatory Visit (HOSPITAL_BASED_OUTPATIENT_CLINIC_OR_DEPARTMENT_OTHER): Payer: No Typology Code available for payment source

## 2013-06-01 VITALS — BP 160/72 | HR 75 | Temp 98.0°F

## 2013-06-01 DIAGNOSIS — C9000 Multiple myeloma not having achieved remission: Secondary | ICD-10-CM

## 2013-06-01 DIAGNOSIS — D472 Monoclonal gammopathy: Secondary | ICD-10-CM

## 2013-06-01 DIAGNOSIS — Z5112 Encounter for antineoplastic immunotherapy: Secondary | ICD-10-CM

## 2013-06-01 DIAGNOSIS — Z23 Encounter for immunization: Secondary | ICD-10-CM

## 2013-06-01 LAB — CBC WITH DIFFERENTIAL/PLATELET
Eosinophils Absolute: 0.1 10*3/uL (ref 0.0–0.5)
LYMPH%: 22.5 % (ref 14.0–49.0)
MCH: 32.1 pg (ref 27.2–33.4)
MCV: 94.9 fL (ref 79.3–98.0)
MONO%: 17.3 % — ABNORMAL HIGH (ref 0.0–14.0)
NEUT#: 3.2 10*3/uL (ref 1.5–6.5)
NEUT%: 58 % (ref 39.0–75.0)
Platelets: 241 10*3/uL (ref 140–400)
RBC: 3.71 10*6/uL — ABNORMAL LOW (ref 4.20–5.82)
RDW: 13.1 % (ref 11.0–14.6)
WBC: 5.4 10*3/uL (ref 4.0–10.3)
nRBC: 0 % (ref 0–0)

## 2013-06-01 MED ORDER — ONDANSETRON HCL 8 MG PO TABS
8.0000 mg | ORAL_TABLET | Freq: Once | ORAL | Status: AC
  Administered 2013-06-01: 8 mg via ORAL

## 2013-06-01 MED ORDER — INFLUENZA VAC SPLIT QUAD 0.5 ML IM SUSP
0.5000 mL | Freq: Once | INTRAMUSCULAR | Status: AC
  Administered 2013-06-01: 0.5 mL via INTRAMUSCULAR
  Filled 2013-06-01: qty 0.5

## 2013-06-01 MED ORDER — DEXAMETHASONE 4 MG PO TABS
ORAL_TABLET | ORAL | Status: AC
  Filled 2013-06-01: qty 10

## 2013-06-01 MED ORDER — BORTEZOMIB CHEMO SQ INJECTION 3.5 MG (2.5MG/ML)
1.5000 mg/m2 | Freq: Once | INTRAMUSCULAR | Status: AC
  Administered 2013-06-01: 3 mg via SUBCUTANEOUS
  Filled 2013-06-01: qty 3

## 2013-06-01 MED ORDER — DEXAMETHASONE 4 MG PO TABS
40.0000 mg | ORAL_TABLET | Freq: Once | ORAL | Status: AC
  Administered 2013-06-01: 40 mg via ORAL

## 2013-06-01 MED ORDER — ONDANSETRON HCL 8 MG PO TABS
ORAL_TABLET | ORAL | Status: AC
  Filled 2013-06-01: qty 1

## 2013-06-01 NOTE — Patient Instructions (Signed)
[facility] Discharge Instructions for Patients Receiving Chemotherapy  Today you received the following chemotherapy agents velcade  To help prevent nausea and vomiting after your treatment, we encourage you to take your nausea medication as needed   If you develop nausea and vomiting that is not controlled by your nausea medication, call the clinic.   BELOW ARE SYMPTOMS THAT SHOULD BE REPORTED IMMEDIATELY:  *FEVER GREATER THAN 100.5 F  *CHILLS WITH OR WITHOUT FEVER  NAUSEA AND VOMITING THAT IS NOT CONTROLLED WITH YOUR NAUSEA MEDICATION  *UNUSUAL SHORTNESS OF BREATH  *UNUSUAL BRUISING OR BLEEDING  TENDERNESS IN MOUTH AND THROAT WITH OR WITHOUT PRESENCE OF ULCERS  *URINARY PROBLEMS  *BOWEL PROBLEMS  UNUSUAL RASH Items with * indicate a potential emergency and should be followed up as soon as possible.  Feel free to call the clinic you have any questions or concerns. The clinic phone number is [phone].    

## 2013-06-03 NOTE — ED Provider Notes (Signed)
Medical screening examination/treatment/procedure(s) were performed by non-physician practitioner and as supervising physician I was immediately available for consultation/collaboration.  EKG Interpretation   None        Raeford Razor, MD 06/03/13 [phone]

## 2013-06-11 ENCOUNTER — Ambulatory Visit (HOSPITAL_BASED_OUTPATIENT_CLINIC_OR_DEPARTMENT_OTHER): Payer: Medicaid Other

## 2013-06-11 ENCOUNTER — Ambulatory Visit (HOSPITAL_BASED_OUTPATIENT_CLINIC_OR_DEPARTMENT_OTHER): Payer: No Typology Code available for payment source | Admitting: Internal Medicine

## 2013-06-11 ENCOUNTER — Other Ambulatory Visit: Payer: Self-pay | Admitting: Internal Medicine

## 2013-06-11 ENCOUNTER — Telehealth: Payer: Self-pay | Admitting: Internal Medicine

## 2013-06-11 ENCOUNTER — Ambulatory Visit (HOSPITAL_BASED_OUTPATIENT_CLINIC_OR_DEPARTMENT_OTHER): Payer: Medicaid Other | Admitting: Internal Medicine

## 2013-06-11 VITALS — BP 150/86 | HR 62 | Temp 98.4°F | Resp 18 | Ht 73.5 in | Wt 177.3 lb

## 2013-06-11 DIAGNOSIS — M545 Low back pain, unspecified: Secondary | ICD-10-CM

## 2013-06-11 DIAGNOSIS — C9000 Multiple myeloma not having achieved remission: Secondary | ICD-10-CM

## 2013-06-11 DIAGNOSIS — Z5112 Encounter for antineoplastic immunotherapy: Secondary | ICD-10-CM

## 2013-06-11 DIAGNOSIS — C61 Malignant neoplasm of prostate: Secondary | ICD-10-CM

## 2013-06-11 LAB — CBC WITH DIFFERENTIAL/PLATELET
BASO%: 0.4 % (ref 0.0–2.0)
EOS%: 2.5 % (ref 0.0–7.0)
HCT: 34 % — ABNORMAL LOW (ref 38.4–49.9)
LYMPH%: 22 % (ref 14.0–49.0)
MCH: 32.1 pg (ref 27.2–33.4)
MCHC: 33.5 g/dL (ref 32.0–36.0)
MCV: 95.8 fL (ref 79.3–98.0)
MONO%: 5.7 % (ref 0.0–14.0)
NEUT%: 69.4 % (ref 39.0–75.0)
Platelets: 240 10*3/uL (ref 140–400)
WBC: 4.7 10*3/uL (ref 4.0–10.3)
lymph#: 1 10*3/uL (ref 0.9–3.3)

## 2013-06-11 MED ORDER — ONDANSETRON HCL 8 MG PO TABS
8.0000 mg | ORAL_TABLET | Freq: Once | ORAL | Status: AC
  Administered 2013-06-11: 8 mg via ORAL

## 2013-06-11 MED ORDER — DEXAMETHASONE 4 MG PO TABS
ORAL_TABLET | ORAL | Status: AC
  Filled 2013-06-11: qty 10

## 2013-06-11 MED ORDER — ONDANSETRON HCL 8 MG PO TABS
ORAL_TABLET | ORAL | Status: AC
  Filled 2013-06-11: qty 1

## 2013-06-11 MED ORDER — BORTEZOMIB CHEMO SQ INJECTION 3.5 MG (2.5MG/ML)
1.5000 mg/m2 | Freq: Once | INTRAMUSCULAR | Status: AC
  Administered 2013-06-11: 3 mg via SUBCUTANEOUS
  Filled 2013-06-11: qty 3

## 2013-06-11 MED ORDER — DEXAMETHASONE 4 MG PO TABS
40.0000 mg | ORAL_TABLET | Freq: Once | ORAL | Status: AC
  Administered 2013-06-11: 40 mg via ORAL

## 2013-06-11 NOTE — Patient Instructions (Signed)
[facility] Discharge Instructions for Patients Receiving Chemotherapy  Today you received the following chemotherapy agents: Velcade. To help prevent nausea and vomiting after your treatment, we encourage you to take your nausea medication.  If you develop nausea and vomiting that is not controlled by your nausea medication, call the clinic.   BELOW ARE SYMPTOMS THAT SHOULD BE REPORTED IMMEDIATELY:  *FEVER GREATER THAN 100.5 F  *CHILLS WITH OR WITHOUT FEVER  NAUSEA AND VOMITING THAT IS NOT CONTROLLED WITH YOUR NAUSEA MEDICATION  *UNUSUAL SHORTNESS OF BREATH  *UNUSUAL BRUISING OR BLEEDING  TENDERNESS IN MOUTH AND THROAT WITH OR WITHOUT PRESENCE OF ULCERS  *URINARY PROBLEMS  *BOWEL PROBLEMS  UNUSUAL RASH Items with * indicate a potential emergency and should be followed up as soon as possible.  Feel free to call the clinic you have any questions or concerns. The clinic phone number is [phone].    
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
## 2013-06-11 NOTE — Telephone Encounter (Signed)
Gave pt appt for labs, will call pt for MD appt for 12/26, Md is on PAL that day , nurse aware of appt

## 2013-06-11 NOTE — Patient Instructions (Signed)
[facility] Discharge Instructions for Patients Receiving Chemotherapy  Today you received the following chemotherapy agents: Velcade. To help prevent nausea and vomiting after your treatment, we encourage you to take your nausea medication.  If you develop nausea and vomiting that is not controlled by your nausea medication, call the clinic.   BELOW ARE SYMPTOMS THAT SHOULD BE REPORTED IMMEDIATELY:  *FEVER GREATER THAN 100.5 F  *CHILLS WITH OR WITHOUT FEVER  NAUSEA AND VOMITING THAT IS NOT CONTROLLED WITH YOUR NAUSEA MEDICATION  *UNUSUAL SHORTNESS OF BREATH  *UNUSUAL BRUISING OR BLEEDING  TENDERNESS IN MOUTH AND THROAT WITH OR WITHOUT PRESENCE OF ULCERS  *URINARY PROBLEMS  *BOWEL PROBLEMS  UNUSUAL RASH Items with * indicate a potential emergency and should be followed up as soon as possible.  Feel free to call the clinic you have any questions or concerns. The clinic phone number is [phone].    

## 2013-06-14 LAB — KAPPA/LAMBDA LIGHT CHAINS: Lambda Free Lght Chn: 2.16 mg/dL (ref 0.57–2.63)

## 2013-06-15 ENCOUNTER — Telehealth: Payer: Self-pay | Admitting: Internal Medicine

## 2013-06-15 NOTE — Telephone Encounter (Signed)
Following up on 11/28 pof. added f/u for 12/30 due to Dr. Rosie Fate out wk of 12/26. lmonvm informing pt and confimring each wkly lb appt on schedule. pt to get new schedule when he comes in 12/5

## 2013-06-18 ENCOUNTER — Other Ambulatory Visit: Payer: Medicaid Other

## 2013-06-18 ENCOUNTER — Ambulatory Visit (HOSPITAL_BASED_OUTPATIENT_CLINIC_OR_DEPARTMENT_OTHER): Payer: No Typology Code available for payment source

## 2013-06-18 ENCOUNTER — Other Ambulatory Visit: Payer: Self-pay | Admitting: Internal Medicine

## 2013-06-18 ENCOUNTER — Encounter (INDEPENDENT_AMBULATORY_CARE_PROVIDER_SITE_OTHER): Payer: Self-pay

## 2013-06-18 VITALS — BP 168/78 | HR 74 | Temp 98.5°F | Resp 20

## 2013-06-18 DIAGNOSIS — D649 Anemia, unspecified: Secondary | ICD-10-CM

## 2013-06-18 DIAGNOSIS — C9000 Multiple myeloma not having achieved remission: Secondary | ICD-10-CM

## 2013-06-18 DIAGNOSIS — Z5112 Encounter for antineoplastic immunotherapy: Secondary | ICD-10-CM

## 2013-06-18 LAB — CBC WITH DIFFERENTIAL/PLATELET
Eosinophils Absolute: 0.2 10*3/uL (ref 0.0–0.5)
HCT: 35.7 % — ABNORMAL LOW (ref 38.4–49.9)
HGB: 11.4 g/dL — ABNORMAL LOW (ref 13.0–17.1)
LYMPH%: 25 % (ref 14.0–49.0)
MONO#: 0.6 10*3/uL (ref 0.1–0.9)
NEUT#: 2.1 10*3/uL (ref 1.5–6.5)
NEUT%: 55 % (ref 39.0–75.0)
Platelets: 174 10*3/uL (ref 140–400)
WBC: 3.8 10*3/uL — ABNORMAL LOW (ref 4.0–10.3)
lymph#: 0.9 10*3/uL (ref 0.9–3.3)

## 2013-06-18 MED ORDER — BORTEZOMIB CHEMO SQ INJECTION 3.5 MG (2.5MG/ML)
1.5000 mg/m2 | Freq: Once | INTRAMUSCULAR | Status: AC
  Administered 2013-06-18: 3 mg via SUBCUTANEOUS
  Filled 2013-06-18: qty 3

## 2013-06-18 MED ORDER — ONDANSETRON HCL 8 MG PO TABS
ORAL_TABLET | ORAL | Status: AC
  Filled 2013-06-18: qty 1

## 2013-06-18 MED ORDER — DEXAMETHASONE 4 MG PO TABS
ORAL_TABLET | ORAL | Status: AC
  Filled 2013-06-18: qty 10

## 2013-06-18 MED ORDER — ONDANSETRON HCL 8 MG PO TABS
8.0000 mg | ORAL_TABLET | Freq: Once | ORAL | Status: AC
  Administered 2013-06-18: 8 mg via ORAL

## 2013-06-18 MED ORDER — DEXAMETHASONE 4 MG PO TABS
40.0000 mg | ORAL_TABLET | Freq: Once | ORAL | Status: AC
  Administered 2013-06-18: 40 mg via ORAL

## 2013-06-18 NOTE — Patient Instructions (Signed)
[facility] Discharge Instructions for Patients Receiving Chemotherapy  Today you received the following chemotherapy agents:  Velcade  To help prevent nausea and vomiting after your treatment, we encourage you to take your nausea medication as ordered per MD.   If you develop nausea and vomiting that is not controlled by your nausea medication, call the clinic.   BELOW ARE SYMPTOMS THAT SHOULD BE REPORTED IMMEDIATELY:  *FEVER GREATER THAN 100.5 F  *CHILLS WITH OR WITHOUT FEVER  NAUSEA AND VOMITING THAT IS NOT CONTROLLED WITH YOUR NAUSEA MEDICATION  *UNUSUAL SHORTNESS OF BREATH  *UNUSUAL BRUISING OR BLEEDING  TENDERNESS IN MOUTH AND THROAT WITH OR WITHOUT PRESENCE OF ULCERS  *URINARY PROBLEMS  *BOWEL PROBLEMS  UNUSUAL RASH Items with * indicate a potential emergency and should be followed up as soon as possible.  Feel free to call the clinic you have any questions or concerns. The clinic phone number is [phone].    

## 2013-06-21 ENCOUNTER — Other Ambulatory Visit: Payer: Self-pay | Admitting: *Deleted

## 2013-06-21 ENCOUNTER — Emergency Department (HOSPITAL_COMMUNITY)
Admission: EM | Admit: 2013-06-21 | Discharge: 2013-06-21 | Disposition: A | Discharge status: Home / Self Care | Payer: No Typology Code available for payment source | Attending: Emergency Medicine | Admitting: Emergency Medicine

## 2013-06-21 ENCOUNTER — Encounter (HOSPITAL_COMMUNITY): Payer: Self-pay | Admitting: Emergency Medicine

## 2013-06-21 ENCOUNTER — Emergency Department (HOSPITAL_COMMUNITY): Payer: No Typology Code available for payment source

## 2013-06-21 DIAGNOSIS — Z85820 Personal history of malignant melanoma of skin: Secondary | ICD-10-CM | POA: Insufficient documentation

## 2013-06-21 DIAGNOSIS — R011 Cardiac murmur, unspecified: Secondary | ICD-10-CM | POA: Insufficient documentation

## 2013-06-21 DIAGNOSIS — I10 Essential (primary) hypertension: Secondary | ICD-10-CM | POA: Insufficient documentation

## 2013-06-21 DIAGNOSIS — Z8546 Personal history of malignant neoplasm of prostate: Secondary | ICD-10-CM | POA: Insufficient documentation

## 2013-06-21 DIAGNOSIS — Z9089 Acquired absence of other organs: Secondary | ICD-10-CM | POA: Insufficient documentation

## 2013-06-21 DIAGNOSIS — Z8701 Personal history of pneumonia (recurrent): Secondary | ICD-10-CM | POA: Insufficient documentation

## 2013-06-21 DIAGNOSIS — K5900 Constipation, unspecified: Secondary | ICD-10-CM | POA: Insufficient documentation

## 2013-06-21 DIAGNOSIS — Z79899 Other long term (current) drug therapy: Secondary | ICD-10-CM | POA: Insufficient documentation

## 2013-06-21 DIAGNOSIS — Z862 Personal history of diseases of the blood and blood-forming organs and certain disorders involving the immune mechanism: Secondary | ICD-10-CM | POA: Insufficient documentation

## 2013-06-21 DIAGNOSIS — M129 Arthropathy, unspecified: Secondary | ICD-10-CM | POA: Insufficient documentation

## 2013-06-21 DIAGNOSIS — C9000 Multiple myeloma not having achieved remission: Secondary | ICD-10-CM

## 2013-06-21 DIAGNOSIS — Z7982 Long term (current) use of aspirin: Secondary | ICD-10-CM | POA: Insufficient documentation

## 2013-06-21 MED ORDER — DOCUSATE SODIUM 100 MG PO CAPS: 100.0000 mg | ORAL_CAPSULE | Freq: Two times a day (BID) | ORAL | Status: DC

## 2013-06-21 MED ORDER — MINERAL OIL RE ENEM: 1.0000 | ENEMA | Freq: Two times a day (BID) | RECTAL | Status: DC
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
## 2013-06-21 NOTE — Telephone Encounter (Signed)
Confirmed with patient he takes last dose of Revlimid for this cycle on 06/25/13. Next cycle due 07/03/13. Refill request on MD desk. Spoke with him regarding his ER visit today-he was constipated. Had enema and now on stool softener and feeling better.

## 2013-06-21 NOTE — ED Notes (Signed)
Pt with Hx of prostate cancer and melanoma reports to ED for constipation x 4 days. Pt states last chemo treatment was last Friday, pt denies radiation treatments. Denies n/v.

## 2013-06-22 MED ORDER — LENALIDOMIDE 25 MG PO CAPS: ORAL_CAPSULE | ORAL | Status: DC

## 2013-06-22 NOTE — Addendum Note (Signed)
Addended by: Arvilla Meres on: 06/22/2013 02:45 PM   Modules accepted: Orders

## 2013-06-24 ENCOUNTER — Telehealth: Payer: Self-pay | Admitting: *Deleted

## 2013-06-24 NOTE — Telephone Encounter (Signed)
Lae entry----I called the patient yesterday and left message with his spouse. Need patient to call office to reschedule appt.  JMW

## 2013-06-24 NOTE — Telephone Encounter (Signed)
Attempted to call patient again to reschedule appts. Left message with a male to have patient call the office

## 2013-06-25 ENCOUNTER — Ambulatory Visit (HOSPITAL_BASED_OUTPATIENT_CLINIC_OR_DEPARTMENT_OTHER): Payer: No Typology Code available for payment source

## 2013-06-25 ENCOUNTER — Other Ambulatory Visit (HOSPITAL_BASED_OUTPATIENT_CLINIC_OR_DEPARTMENT_OTHER): Payer: Medicaid Other

## 2013-06-25 ENCOUNTER — Other Ambulatory Visit: Payer: Self-pay | Admitting: Internal Medicine

## 2013-06-25 VITALS — BP 167/72 | HR 88 | Temp 97.4°F

## 2013-06-25 DIAGNOSIS — Z5112 Encounter for antineoplastic immunotherapy: Secondary | ICD-10-CM

## 2013-06-25 DIAGNOSIS — C9000 Multiple myeloma not having achieved remission: Secondary | ICD-10-CM

## 2013-06-25 LAB — CBC WITH DIFFERENTIAL/PLATELET
BASO%: 0.3 % (ref 0.0–2.0)
Basophils Absolute: 0 10*3/uL (ref 0.0–0.1)
HCT: 37.2 % — ABNORMAL LOW (ref 38.4–49.9)
HGB: 12.4 g/dL — ABNORMAL LOW (ref 13.0–17.1)
LYMPH%: 29.4 % (ref 14.0–49.0)
MCH: 31.5 pg (ref 27.2–33.4)
MCHC: 33.3 g/dL (ref 32.0–36.0)
MONO#: 0.5 10*3/uL (ref 0.1–0.9)
MONO%: 14.2 % — ABNORMAL HIGH (ref 0.0–14.0)
NEUT%: 52.8 % (ref 39.0–75.0)
Platelets: 135 10*3/uL — ABNORMAL LOW (ref 140–400)
RBC: 3.94 10*6/uL — ABNORMAL LOW (ref 4.20–5.82)
WBC: 3.4 10*3/uL — ABNORMAL LOW (ref 4.0–10.3)

## 2013-06-25 MED ORDER — DEXAMETHASONE 4 MG PO TABS
ORAL_TABLET | ORAL | Status: AC
  Filled 2013-06-25: qty 10

## 2013-06-25 MED ORDER — DEXAMETHASONE 4 MG PO TABS
40.0000 mg | ORAL_TABLET | Freq: Once | ORAL | Status: AC
  Administered 2013-06-25: 40 mg via ORAL

## 2013-06-25 MED ORDER — BORTEZOMIB CHEMO SQ INJECTION 3.5 MG (2.5MG/ML)
1.5000 mg/m2 | Freq: Once | INTRAMUSCULAR | Status: AC
  Administered 2013-06-25: 3 mg via SUBCUTANEOUS
  Filled 2013-06-25: qty 3

## 2013-06-25 MED ORDER — ONDANSETRON HCL 8 MG PO TABS
ORAL_TABLET | ORAL | Status: AC
  Filled 2013-06-25: qty 1

## 2013-06-25 MED ORDER — ONDANSETRON HCL 8 MG PO TABS
8.0000 mg | ORAL_TABLET | Freq: Once | ORAL | Status: AC
  Administered 2013-06-25: 8 mg via ORAL

## 2013-06-25 NOTE — Patient Instructions (Signed)
[facility] Discharge Instructions for Patients Receiving Chemotherapy  Today you received the following chemotherapy agents: Velcade.  To help prevent nausea and vomiting after your treatment, we encourage you to take your nausea medication as prescribed.   If you develop nausea and vomiting that is not controlled by your nausea medication, call the clinic.   BELOW ARE SYMPTOMS THAT SHOULD BE REPORTED IMMEDIATELY:  *FEVER GREATER THAN 100.5 F  *CHILLS WITH OR WITHOUT FEVER  NAUSEA AND VOMITING THAT IS NOT CONTROLLED WITH YOUR NAUSEA MEDICATION  *UNUSUAL SHORTNESS OF BREATH  *UNUSUAL BRUISING OR BLEEDING  TENDERNESS IN MOUTH AND THROAT WITH OR WITHOUT PRESENCE OF ULCERS  *URINARY PROBLEMS  *BOWEL PROBLEMS  UNUSUAL RASH Items with * indicate a potential emergency and should be followed up as soon as possible.  Feel free to call the clinic you have any questions or concerns. The clinic phone number is [phone].    

## 2013-07-01 ENCOUNTER — Ambulatory Visit: Payer: No Typology Code available for payment source

## 2013-07-02 ENCOUNTER — Other Ambulatory Visit: Payer: Self-pay | Admitting: Internal Medicine

## 2013-07-02 ENCOUNTER — Other Ambulatory Visit (HOSPITAL_BASED_OUTPATIENT_CLINIC_OR_DEPARTMENT_OTHER): Payer: Medicaid Other

## 2013-07-02 ENCOUNTER — Ambulatory Visit (HOSPITAL_BASED_OUTPATIENT_CLINIC_OR_DEPARTMENT_OTHER): Payer: No Typology Code available for payment source

## 2013-07-02 DIAGNOSIS — Z5112 Encounter for antineoplastic immunotherapy: Secondary | ICD-10-CM

## 2013-07-02 DIAGNOSIS — C9000 Multiple myeloma not having achieved remission: Secondary | ICD-10-CM

## 2013-07-02 LAB — CBC WITH DIFFERENTIAL/PLATELET
BASO%: 1.2 % (ref 0.0–2.0)
EOS%: 1.4 % (ref 0.0–7.0)
Eosinophils Absolute: 0.1 10*3/uL (ref 0.0–0.5)
HCT: 38.3 % — ABNORMAL LOW (ref 38.4–49.9)
LYMPH%: 23.9 % (ref 14.0–49.0)
MCH: 32.2 pg (ref 27.2–33.4)
MCHC: 33.5 g/dL (ref 32.0–36.0)
MCV: 96.1 fL (ref 79.3–98.0)
MONO%: 15.6 % — ABNORMAL HIGH (ref 0.0–14.0)
NEUT%: 57.9 % (ref 39.0–75.0)
Platelets: 204 10*3/uL (ref 140–400)
RBC: 3.98 10*6/uL — ABNORMAL LOW (ref 4.20–5.82)
RDW: 14.8 % — ABNORMAL HIGH (ref 11.0–14.6)
WBC: 4.6 10*3/uL (ref 4.0–10.3)

## 2013-07-02 MED ORDER — DEXAMETHASONE 4 MG PO TABS
ORAL_TABLET | ORAL | Status: AC
  Filled 2013-07-02: qty 10

## 2013-07-02 MED ORDER — BORTEZOMIB CHEMO SQ INJECTION 3.5 MG (2.5MG/ML)
1.5000 mg/m2 | Freq: Once | INTRAMUSCULAR | Status: AC
  Administered 2013-07-02: 3 mg via SUBCUTANEOUS
  Filled 2013-07-02: qty 3

## 2013-07-02 MED ORDER — ONDANSETRON HCL 8 MG PO TABS
8.0000 mg | ORAL_TABLET | Freq: Once | ORAL | Status: AC
  Administered 2013-07-02: 8 mg via ORAL

## 2013-07-02 MED ORDER — ONDANSETRON HCL 8 MG PO TABS
ORAL_TABLET | ORAL | Status: AC
  Filled 2013-07-02: qty 1

## 2013-07-02 MED ORDER — DEXAMETHASONE 4 MG PO TABS
40.0000 mg | ORAL_TABLET | Freq: Once | ORAL | Status: AC
  Administered 2013-07-02: 40 mg via ORAL

## 2013-07-02 NOTE — Telephone Encounter (Signed)
RECEIVED A FAX FROM [facility] CONCERNING A CONFIRMATION OF PRESCRIPTION SHIPMENT FOR REVLIMID ON 06/30/13.

## 2013-07-05 ENCOUNTER — Telehealth: Payer: Self-pay | Admitting: *Deleted

## 2013-07-05 ENCOUNTER — Other Ambulatory Visit: Payer: Self-pay | Admitting: *Deleted

## 2013-07-05 DIAGNOSIS — C9000 Multiple myeloma not having achieved remission: Secondary | ICD-10-CM

## 2013-07-05 MED ORDER — LIDOCAINE-PRILOCAINE 2.5-2.5 % EX CREA: TOPICAL_CREAM | CUTANEOUS | Status: DC

## 2013-07-05 NOTE — Telephone Encounter (Signed)
Walk in form received from patient, "I need to get some medicine".  In front lobby asking for a sample of Emla cream for next flush of port-a-cath on 07-09-2013.  Informed him there is a charge for this and he needs to apply cream 1.5 hours before we access his port.  States he does not wish to pay $9.00 for a tube.  Suggested we apply ice to site a few minutes before access.  Order sent to [facility] and he will decide if he will purchase ointment or try the ice.

## 2013-07-09 ENCOUNTER — Other Ambulatory Visit (HOSPITAL_BASED_OUTPATIENT_CLINIC_OR_DEPARTMENT_OTHER): Payer: Medicaid Other

## 2013-07-09 ENCOUNTER — Ambulatory Visit (HOSPITAL_BASED_OUTPATIENT_CLINIC_OR_DEPARTMENT_OTHER): Payer: No Typology Code available for payment source

## 2013-07-09 VITALS — BP 152/75 | HR 62 | Temp 98.2°F | Resp 18

## 2013-07-09 DIAGNOSIS — Z5112 Encounter for antineoplastic immunotherapy: Secondary | ICD-10-CM

## 2013-07-09 DIAGNOSIS — C9000 Multiple myeloma not having achieved remission: Secondary | ICD-10-CM

## 2013-07-09 LAB — CBC WITH DIFFERENTIAL/PLATELET
BASO%: 0.2 % (ref 0.0–2.0)
EOS%: 1.2 % (ref 0.0–7.0)
Eosinophils Absolute: 0.1 10*3/uL (ref 0.0–0.5)
LYMPH%: 25.7 % (ref 14.0–49.0)
MCH: 31.3 pg (ref 27.2–33.4)
MCHC: 33.4 g/dL (ref 32.0–36.0)
MCV: 93.8 fL (ref 79.3–98.0)
MONO%: 7.2 % (ref 0.0–14.0)
NEUT#: 2.7 10*3/uL (ref 1.5–6.5)
Platelets: 173 10*3/uL (ref 140–400)
RBC: 3.86 10*6/uL — ABNORMAL LOW (ref 4.20–5.82)
nRBC: 0 % (ref 0–0)

## 2013-07-09 LAB — COMPREHENSIVE METABOLIC PANEL (CC13)
ALT: 25 U/L (ref 0–55)
AST: 18 U/L (ref 5–34)
Alkaline Phosphatase: 84 U/L (ref 40–150)
Anion Gap: 9 mEq/L (ref 3–11)
CO2: 25 mEq/L (ref 22–29)
Creatinine: 0.8 mg/dL (ref 0.7–1.3)
Glucose: 93 mg/dl (ref 70–140)
Potassium: 4.1 mEq/L (ref 3.5–5.1)
Total Bilirubin: 0.36 mg/dL (ref 0.20–1.20)
Total Protein: 7.2 g/dL (ref 6.4–8.3)

## 2013-07-09 MED ORDER — HEPARIN SOD (PORK) LOCK FLUSH 100 UNIT/ML IV SOLN
500.0000 [IU] | Freq: Once | INTRAVENOUS | Status: AC
  Administered 2013-07-09: 500 [IU] via INTRAVENOUS
  Filled 2013-07-09: qty 5

## 2013-07-09 MED ORDER — ONDANSETRON HCL 8 MG PO TABS
8.0000 mg | ORAL_TABLET | Freq: Once | ORAL | Status: AC
  Administered 2013-07-09: 8 mg via ORAL

## 2013-07-09 MED ORDER — BORTEZOMIB CHEMO SQ INJECTION 3.5 MG (2.5MG/ML)
1.5000 mg/m2 | Freq: Once | INTRAMUSCULAR | Status: AC
  Administered 2013-07-09: 3 mg via SUBCUTANEOUS
  Filled 2013-07-09: qty 3

## 2013-07-09 MED ORDER — DEXAMETHASONE 4 MG PO TABS
40.0000 mg | ORAL_TABLET | Freq: Once | ORAL | Status: AC
  Administered 2013-07-09: 40 mg via ORAL

## 2013-07-09 MED ORDER — DEXAMETHASONE 4 MG PO TABS
ORAL_TABLET | ORAL | Status: AC
  Filled 2013-07-09: qty 10

## 2013-07-09 MED ORDER — ONDANSETRON HCL 8 MG PO TABS
ORAL_TABLET | ORAL | Status: AC
  Filled 2013-07-09: qty 1

## 2013-07-09 MED ORDER — SODIUM CHLORIDE 0.9 % IJ SOLN
10.0000 mL | INTRAMUSCULAR | Status: DC | PRN
  Administered 2013-07-09: 10 mL via INTRAVENOUS
  Filled 2013-07-09: qty 10

## 2013-07-09 NOTE — Patient Instructions (Signed)
[facility] Discharge Instructions for Patients Receiving Chemotherapy  Today you received the following chemotherapy agent Velcade injection as well as having your Port flushed.  To help prevent nausea and vomiting after your treatment, we encourage you to take your nausea medication.   If you develop nausea and vomiting that is not controlled by your nausea medication, call the clinic.   BELOW ARE SYMPTOMS THAT SHOULD BE REPORTED IMMEDIATELY:  *FEVER GREATER THAN 100.5 F  *CHILLS WITH OR WITHOUT FEVER  NAUSEA AND VOMITING THAT IS NOT CONTROLLED WITH YOUR NAUSEA MEDICATION  *UNUSUAL SHORTNESS OF BREATH  *UNUSUAL BRUISING OR BLEEDING  TENDERNESS IN MOUTH AND THROAT WITH OR WITHOUT PRESENCE OF ULCERS  *URINARY PROBLEMS  *BOWEL PROBLEMS  UNUSUAL RASH Items with * indicate a potential emergency and should be followed up as soon as possible.  Feel free to call the clinic you have any questions or concerns. The clinic phone number is [phone].   Implanted Port Instructions An implanted port is a central line that has a round shape and is placed under the skin. It is used for long-term IV (intravenous) access for:  Medicine.  Fluids.  Liquid nutrition, such as TPN (total parenteral nutrition).  Blood samples. Ports can be placed:  In the chest area just below the collarbone (this is the most common place.)  In the arms.  In the belly (abdomen) area.  In the legs. PARTS OF THE PORT A port has 2 main parts:  The reservoir. The reservoir is round, disc-shaped, and will be a small, raised area under your skin.  The reservoir is the part where a needle is inserted (accessed) to either give medicines or to draw blood.  The catheter. The catheter is a long, slender tube that extends from the reservoir. The catheter is placed into a large vein.  Medicine that is inserted into the reservoir goes into the catheter and then into the vein. INSERTION OF  THE PORT  The port is surgically placed in either an operating room or in a procedural area (interventional radiology).  Medicine may be given to help you relax during the procedure.  The skin where the port will be inserted is numbed (local anesthetic).  1 or 2 small cuts (incisions) will be made in the skin to insert the port.  The port can be used after it has been inserted. INCISION SITE CARE  The incision site may have small adhesive strips on it. This helps keep the incision site closed. Sometimes, no adhesive strips are placed. Instead of adhesive strips, a special kind of surgical glue is used to keep the incision closed.  If adhesive strips were placed on the incision sites, do not take them off. They will fall off on their own.  The incision site may be sore for 1 to 2 days. Pain medicine can help.  Do not get the incision site wet. Bathe or shower as directed by your caregiver.  The incision site should heal in 5 to 7 days. A small scar may form after the incision has healed. ACCESSING THE PORT Special steps must be taken to access the port:  Before the port is accessed, a numbing cream can be placed on the skin. This helps numb the skin over the port site.  A sterile technique is used to access the port.  The port is accessed with a needle. Only "non-coring" port needles should be used to access the port. Once the port is accessed, a  blood return should be checked. This helps ensure the port is in the vein and is not clogged (clotted).  If your caregiver believes your port should remain accessed, a clear (transparent) bandage will be placed over the needle site. The bandage and needle will need to be changed every week or as directed by your caregiver.  Keep the bandage covering the needle clean and dry. Do not get it wet. Follow your caregiver's instructions on how to take a shower or bath when the port is accessed.  If your port does not need to stay accessed, no  bandage is needed over the port. FLUSHING THE PORT Flushing the port keeps it from getting clogged. How often the port is flushed depends on:  If a constant infusion is running. If a constant infusion is running, the port may not need to be flushed.  If intermittent medicines are given.  If the port is not being used. For intermittent medicines:  The port will need to be flushed:  After medicines have been given.  After blood has been drawn.  As part of routine maintenance.  A port is normally flushed with:  Normal saline.  Heparin.  Follow your caregiver's advice on how often, how much, and the type of flush to use on your port. IMPORTANT PORT INFORMATION  Tell your caregiver if you are allergic to heparin.  After your port is placed, you will get a manufacturer's information card. The card has information about your port. Keep this card with you at all times.  There are many types of ports available. Know what kind of port you have.  In case of an emergency, it may be helpful to wear a medical alert bracelet. This can help alert health care workers that you have a port.  The port can stay in for as long as your caregiver believes it is necessary.  When it is time for the port to come out, surgery will be done to remove it. The surgery will be similar to how the port was put in.  If you are in the hospital or clinic:  Your port will be taken care of and flushed by a nurse.  If you are at home:  A home health care nurse may give medicines and take care of the port.  You or a family member can get special training and directions for giving medicine and taking care of the port at home. SEEK IMMEDIATE MEDICAL CARE IF:   Your port does not flush or you are unable to get a blood return.  New drainage or pus is coming from the incision.  A bad smell is coming from the incision site.  You develop swelling or increased redness at the incision site.  You develop  increased swelling or pain at the port site.  You develop swelling or pain in the surrounding skin near the port.  You have an oral temperature above 102 F (38.9 C), not controlled by medicine. MAKE SURE YOU:   Understand these instructions.  Will watch your condition.  Will get help right away if you are not doing well or get worse. Document Released: 07/01/2005 Document Revised: 09/23/2011 Document Reviewed: 09/22/2008 [facility] Patient Information 2014 [place], [place].

## 2013-07-13 ENCOUNTER — Telehealth: Payer: Self-pay | Admitting: Medical Oncology

## 2013-07-13 ENCOUNTER — Ambulatory Visit: Payer: No Typology Code available for payment source

## 2013-07-13 ENCOUNTER — Other Ambulatory Visit: Payer: Self-pay | Admitting: Medical Oncology

## 2013-07-13 DIAGNOSIS — C9000 Multiple myeloma not having achieved remission: Secondary | ICD-10-CM

## 2013-07-13 NOTE — Telephone Encounter (Signed)
I called pt to inquire about his missed appointment today with Dr. Rosie Fate. He states he did not know about today but he did have his appointment for 07/16/13. I spoke with Dr. Rosie Fate and we can treat 1/02 and reschedule him for missed appointment.

## 2013-07-14 ENCOUNTER — Other Ambulatory Visit: Payer: Self-pay | Admitting: Internal Medicine

## 2013-07-14 ENCOUNTER — Telehealth: Payer: Self-pay | Admitting: Internal Medicine

## 2013-07-14 ENCOUNTER — Telehealth: Payer: Self-pay | Admitting: *Deleted

## 2013-07-14 NOTE — Telephone Encounter (Signed)
, °

## 2013-07-14 NOTE — Telephone Encounter (Signed)
Per staff message and POF I have scheduled appts.  JMW  

## 2013-07-16 ENCOUNTER — Ambulatory Visit (HOSPITAL_BASED_OUTPATIENT_CLINIC_OR_DEPARTMENT_OTHER): Payer: No Typology Code available for payment source

## 2013-07-16 VITALS — BP 156/92 | HR 66 | Temp 98.5°F

## 2013-07-16 DIAGNOSIS — Z5112 Encounter for antineoplastic immunotherapy: Secondary | ICD-10-CM

## 2013-07-16 DIAGNOSIS — C9000 Multiple myeloma not having achieved remission: Secondary | ICD-10-CM

## 2013-07-16 MED ORDER — ONDANSETRON HCL 8 MG PO TABS
ORAL_TABLET | ORAL | Status: AC
  Filled 2013-07-16: qty 1

## 2013-07-16 MED ORDER — DEXAMETHASONE 4 MG PO TABS
ORAL_TABLET | ORAL | Status: AC
  Filled 2013-07-16: qty 10

## 2013-07-16 MED ORDER — BORTEZOMIB CHEMO SQ INJECTION 3.5 MG (2.5MG/ML)
1.5000 mg/m2 | Freq: Once | INTRAMUSCULAR | Status: AC
  Administered 2013-07-16: 3 mg via SUBCUTANEOUS
  Filled 2013-07-16: qty 3

## 2013-07-16 MED ORDER — DEXAMETHASONE 4 MG PO TABS
40.0000 mg | ORAL_TABLET | Freq: Once | ORAL | Status: AC
  Administered 2013-07-16: 40 mg via ORAL

## 2013-07-16 MED ORDER — ONDANSETRON HCL 8 MG PO TABS
8.0000 mg | ORAL_TABLET | Freq: Once | ORAL | Status: AC
  Administered 2013-07-16: 8 mg via ORAL

## 2013-07-16 NOTE — Patient Instructions (Signed)
[facility] Discharge Instructions for Patients Receiving Chemotherapy  Today you received the following chemotherapy agents: Velcade.  To help prevent nausea and vomiting after your treatment, we encourage you to take your nausea medication as prescribed.   If you develop nausea and vomiting that is not controlled by your nausea medication, call the clinic.   BELOW ARE SYMPTOMS THAT SHOULD BE REPORTED IMMEDIATELY:  *FEVER GREATER THAN 100.5 F  *CHILLS WITH OR WITHOUT FEVER  NAUSEA AND VOMITING THAT IS NOT CONTROLLED WITH YOUR NAUSEA MEDICATION  *UNUSUAL SHORTNESS OF BREATH  *UNUSUAL BRUISING OR BLEEDING  TENDERNESS IN MOUTH AND THROAT WITH OR WITHOUT PRESENCE OF ULCERS  *URINARY PROBLEMS  *BOWEL PROBLEMS  UNUSUAL RASH Items with * indicate a potential emergency and should be followed up as soon as possible.  Feel free to call the clinic you have any questions or concerns. The clinic phone number is [phone].    

## 2013-07-21 ENCOUNTER — Other Ambulatory Visit: Payer: Self-pay

## 2013-07-21 ENCOUNTER — Other Ambulatory Visit: Payer: Self-pay | Admitting: *Deleted

## 2013-07-21 DIAGNOSIS — C9000 Multiple myeloma not having achieved remission: Secondary | ICD-10-CM

## 2013-07-21 MED ORDER — LENALIDOMIDE 25 MG PO CAPS: ORAL_CAPSULE | ORAL | Status: DC

## 2013-07-21 NOTE — Telephone Encounter (Signed)
Refill request for Revlimid to MD desk for approval. Unable to do prescriber survey on computer--went thru easily on [place] phone line. Josem Kaufmann #[id number]. Patient did his last survey on 07/20/13

## 2013-07-23 ENCOUNTER — Other Ambulatory Visit (HOSPITAL_BASED_OUTPATIENT_CLINIC_OR_DEPARTMENT_OTHER): Payer: Medicaid Other

## 2013-07-23 ENCOUNTER — Other Ambulatory Visit: Payer: Self-pay | Admitting: *Deleted

## 2013-07-23 ENCOUNTER — Other Ambulatory Visit: Payer: No Typology Code available for payment source

## 2013-07-23 ENCOUNTER — Ambulatory Visit (HOSPITAL_BASED_OUTPATIENT_CLINIC_OR_DEPARTMENT_OTHER): Payer: No Typology Code available for payment source

## 2013-07-23 ENCOUNTER — Ambulatory Visit: Payer: No Typology Code available for payment source

## 2013-07-23 VITALS — BP 145/80 | HR 64 | Temp 98.0°F | Resp 20

## 2013-07-23 DIAGNOSIS — C9000 Multiple myeloma not having achieved remission: Secondary | ICD-10-CM

## 2013-07-23 DIAGNOSIS — Z5112 Encounter for antineoplastic immunotherapy: Secondary | ICD-10-CM

## 2013-07-23 LAB — CBC WITH DIFFERENTIAL/PLATELET
BASO%: 1.2 % (ref 0.0–2.0)
Basophils Absolute: 0 10*3/uL (ref 0.0–0.1)
EOS ABS: 0.1 10*3/uL (ref 0.0–0.5)
EOS%: 2.4 % (ref 0.0–7.0)
HEMATOCRIT: 37.9 % — AB (ref 38.4–49.9)
HGB: 12.8 g/dL — ABNORMAL LOW (ref 13.0–17.1)
LYMPH%: 25.2 % (ref 14.0–49.0)
MCH: 31.1 pg (ref 27.2–33.4)
MCHC: 33.8 g/dL (ref 32.0–36.0)
MCV: 92 fL (ref 79.3–98.0)
MONO#: 0.6 10*3/uL (ref 0.1–0.9)
MONO%: 17.9 % — ABNORMAL HIGH (ref 0.0–14.0)
NEUT#: 1.8 10*3/uL (ref 1.5–6.5)
NEUT%: 53.3 % (ref 39.0–75.0)
PLATELETS: 167 10*3/uL (ref 140–400)
RBC: 4.12 10*6/uL — ABNORMAL LOW (ref 4.20–5.82)
RDW: 14 % (ref 11.0–14.6)
WBC: 3.3 10*3/uL — AB (ref 4.0–10.3)
lymph#: 0.8 10*3/uL — ABNORMAL LOW (ref 0.9–3.3)

## 2013-07-23 MED ORDER — ONDANSETRON HCL 8 MG PO TABS
8.0000 mg | ORAL_TABLET | Freq: Once | ORAL | Status: AC
  Administered 2013-07-23: 8 mg via ORAL

## 2013-07-23 MED ORDER — DEXAMETHASONE 4 MG PO TABS
40.0000 mg | ORAL_TABLET | Freq: Once | ORAL | Status: AC
  Administered 2013-07-23: 40 mg via ORAL

## 2013-07-23 MED ORDER — ONDANSETRON HCL 8 MG PO TABS
ORAL_TABLET | ORAL | Status: AC
  Filled 2013-07-23: qty 1

## 2013-07-23 MED ORDER — DEXAMETHASONE 4 MG PO TABS
ORAL_TABLET | ORAL | Status: AC
  Filled 2013-07-23: qty 10

## 2013-07-23 MED ORDER — BORTEZOMIB CHEMO SQ INJECTION 3.5 MG (2.5MG/ML)
1.5000 mg/m2 | Freq: Once | INTRAMUSCULAR | Status: AC
  Administered 2013-07-23: 3 mg via SUBCUTANEOUS
  Filled 2013-07-23: qty 3

## 2013-07-23 NOTE — Patient Instructions (Signed)
[place] Discharge Instructions for Patients Receiving Chemotherapy  Today you received the following chemotherapy agents: Velcade  To help prevent nausea and vomiting after your treatment, we encourage you to take your nausea medication as prescribed by your physician.   If you develop nausea and vomiting that is not controlled by your nausea medication, call the clinic.   BELOW ARE SYMPTOMS THAT SHOULD BE REPORTED IMMEDIATELY:  *FEVER GREATER THAN 100.5 F  *CHILLS WITH OR WITHOUT FEVER  NAUSEA AND VOMITING THAT IS NOT CONTROLLED WITH YOUR NAUSEA MEDICATION  *UNUSUAL SHORTNESS OF BREATH  *UNUSUAL BRUISING OR BLEEDING  TENDERNESS IN MOUTH AND THROAT WITH OR WITHOUT PRESENCE OF ULCERS  *URINARY PROBLEMS  *BOWEL PROBLEMS  UNUSUAL RASH Items with * indicate a potential emergency and should be followed up as soon as possible.  Feel free to call the clinic you have any questions or concerns. The clinic phone number is [phone].

## 2013-07-26 NOTE — Telephone Encounter (Signed)
RECEIVED A FAX FROM [facility] CONCERNING A CONFIRMATION OF PRESCRIPTION SHIPMENT FOR REVLIMID ON 07/23/13.

## 2013-07-30 ENCOUNTER — Telehealth: Payer: Self-pay | Admitting: Internal Medicine

## 2013-07-30 ENCOUNTER — Ambulatory Visit (HOSPITAL_BASED_OUTPATIENT_CLINIC_OR_DEPARTMENT_OTHER): Payer: No Typology Code available for payment source

## 2013-07-30 ENCOUNTER — Other Ambulatory Visit: Payer: Self-pay | Admitting: Internal Medicine

## 2013-07-30 ENCOUNTER — Other Ambulatory Visit (HOSPITAL_BASED_OUTPATIENT_CLINIC_OR_DEPARTMENT_OTHER): Payer: Medicaid Other

## 2013-07-30 ENCOUNTER — Ambulatory Visit (HOSPITAL_BASED_OUTPATIENT_CLINIC_OR_DEPARTMENT_OTHER): Payer: Medicaid Other | Admitting: Internal Medicine

## 2013-07-30 ENCOUNTER — Telehealth: Payer: Self-pay | Admitting: *Deleted

## 2013-07-30 VITALS — BP 154/83 | HR 69 | Temp 98.1°F | Resp 18 | Ht 73.5 in | Wt 180.2 lb

## 2013-07-30 DIAGNOSIS — G8929 Other chronic pain: Secondary | ICD-10-CM

## 2013-07-30 DIAGNOSIS — C61 Malignant neoplasm of prostate: Secondary | ICD-10-CM

## 2013-07-30 DIAGNOSIS — K007 Teething syndrome: Secondary | ICD-10-CM

## 2013-07-30 DIAGNOSIS — M545 Low back pain, unspecified: Secondary | ICD-10-CM

## 2013-07-30 DIAGNOSIS — Z5112 Encounter for antineoplastic immunotherapy: Secondary | ICD-10-CM

## 2013-07-30 DIAGNOSIS — C9000 Multiple myeloma not having achieved remission: Secondary | ICD-10-CM

## 2013-07-30 LAB — CBC WITH DIFFERENTIAL/PLATELET
BASO%: 0.3 % (ref 0.0–2.0)
Basophils Absolute: 0 10*3/uL (ref 0.0–0.1)
EOS%: 1.3 % (ref 0.0–7.0)
Eosinophils Absolute: 0 10*3/uL (ref 0.0–0.5)
HCT: 35.3 % — ABNORMAL LOW (ref 38.4–49.9)
HGB: 11.9 g/dL — ABNORMAL LOW (ref 13.0–17.1)
LYMPH%: 24.8 % (ref 14.0–49.0)
MCH: 31.2 pg (ref 27.2–33.4)
MCHC: 33.7 g/dL (ref 32.0–36.0)
MCV: 92.4 fL (ref 79.3–98.0)
MONO#: 0.5 10*3/uL (ref 0.1–0.9)
MONO%: 17 % — ABNORMAL HIGH (ref 0.0–14.0)
NEUT#: 1.8 10*3/uL (ref 1.5–6.5)
NEUT%: 56.6 % (ref 39.0–75.0)
Platelets: 187 10*3/uL (ref 140–400)
RBC: 3.82 10*6/uL — ABNORMAL LOW (ref 4.20–5.82)
RDW: 14.3 % (ref 11.0–14.6)
WBC: 3.1 10*3/uL — ABNORMAL LOW (ref 4.0–10.3)
lymph#: 0.8 10*3/uL — ABNORMAL LOW (ref 0.9–3.3)

## 2013-07-30 LAB — COMPREHENSIVE METABOLIC PANEL (CC13)
ALT: 19 U/L (ref 0–55)
ANION GAP: 8 meq/L (ref 3–11)
AST: 17 U/L (ref 5–34)
Albumin: 3.7 g/dL (ref 3.5–5.0)
Alkaline Phosphatase: 89 U/L (ref 40–150)
BILIRUBIN TOTAL: 0.41 mg/dL (ref 0.20–1.20)
BUN: 9.4 mg/dL (ref 7.0–26.0)
CO2: 24 meq/L (ref 22–29)
CREATININE: 0.8 mg/dL (ref 0.7–1.3)
Calcium: 9 mg/dL (ref 8.4–10.4)
Chloride: 106 mEq/L (ref 98–109)
Glucose: 112 mg/dl (ref 70–140)
Potassium: 4.4 mEq/L (ref 3.5–5.1)
Sodium: 138 mEq/L (ref 136–145)
Total Protein: 6.5 g/dL (ref 6.4–8.3)

## 2013-07-30 MED ORDER — DEXAMETHASONE 4 MG PO TABS
40.0000 mg | ORAL_TABLET | Freq: Once | ORAL | Status: AC
  Administered 2013-07-30: 40 mg via ORAL

## 2013-07-30 MED ORDER — ONDANSETRON HCL 8 MG PO TABS
8.0000 mg | ORAL_TABLET | Freq: Once | ORAL | Status: AC
  Administered 2013-07-30: 8 mg via ORAL

## 2013-07-30 MED ORDER — ONDANSETRON HCL 8 MG PO TABS
ORAL_TABLET | ORAL | Status: AC
  Filled 2013-07-30: qty 1

## 2013-07-30 MED ORDER — BORTEZOMIB CHEMO SQ INJECTION 3.5 MG (2.5MG/ML)
1.5000 mg/m2 | Freq: Once | INTRAMUSCULAR | Status: AC
  Administered 2013-07-30: 3 mg via SUBCUTANEOUS
  Filled 2013-07-30: qty 3

## 2013-07-30 MED ORDER — DEXAMETHASONE 4 MG PO TABS
ORAL_TABLET | ORAL | Status: AC
  Filled 2013-07-30: qty 10

## 2013-07-30 NOTE — Telephone Encounter (Signed)
gave pt appt for lab, md and chemo for january 2015 °
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
## 2013-07-30 NOTE — Telephone Encounter (Signed)
Per staff phone call and POF I have schedueld appts.  JMW  

## 2013-07-30 NOTE — Patient Instructions (Signed)
[facility] Discharge Instructions for Patients Receiving Chemotherapy  Today you received the following chemotherapy agents Velcade and Decadron.  To help prevent nausea and vomiting after your treatment, we encourage you to take your nausea medication if needed.   If you develop nausea and vomiting that is not controlled by your nausea medication, call the clinic.   BELOW ARE SYMPTOMS THAT SHOULD BE REPORTED IMMEDIATELY:  *FEVER GREATER THAN 100.5 F  *CHILLS WITH OR WITHOUT FEVER  NAUSEA AND VOMITING THAT IS NOT CONTROLLED WITH YOUR NAUSEA MEDICATION  *UNUSUAL SHORTNESS OF BREATH  *UNUSUAL BRUISING OR BLEEDING  TENDERNESS IN MOUTH AND THROAT WITH OR WITHOUT PRESENCE OF ULCERS  *URINARY PROBLEMS  *BOWEL PROBLEMS  UNUSUAL RASH Items with * indicate a potential emergency and should be followed up as soon as possible.  Feel free to call the clinic you have any questions or concerns. The clinic phone number is [phone].

## 2013-08-06 ENCOUNTER — Other Ambulatory Visit (HOSPITAL_BASED_OUTPATIENT_CLINIC_OR_DEPARTMENT_OTHER): Payer: Medicaid Other

## 2013-08-06 ENCOUNTER — Ambulatory Visit (HOSPITAL_BASED_OUTPATIENT_CLINIC_OR_DEPARTMENT_OTHER): Payer: No Typology Code available for payment source

## 2013-08-06 ENCOUNTER — Other Ambulatory Visit: Payer: Self-pay | Admitting: Internal Medicine

## 2013-08-06 ENCOUNTER — Telehealth: Payer: Self-pay | Admitting: Internal Medicine

## 2013-08-06 VITALS — BP 167/86 | HR 71 | Temp 98.7°F | Resp 20

## 2013-08-06 DIAGNOSIS — C9000 Multiple myeloma not having achieved remission: Secondary | ICD-10-CM

## 2013-08-06 DIAGNOSIS — Z5112 Encounter for antineoplastic immunotherapy: Secondary | ICD-10-CM

## 2013-08-06 LAB — CBC WITH DIFFERENTIAL/PLATELET
BASO%: 0.7 % (ref 0.0–2.0)
Basophils Absolute: 0 10*3/uL (ref 0.0–0.1)
EOS%: 1.3 % (ref 0.0–7.0)
Eosinophils Absolute: 0.1 10*3/uL (ref 0.0–0.5)
HEMATOCRIT: 37.6 % — AB (ref 38.4–49.9)
HGB: 12.6 g/dL — ABNORMAL LOW (ref 13.0–17.1)
LYMPH%: 19.8 % (ref 14.0–49.0)
MCH: 31.6 pg (ref 27.2–33.4)
MCHC: 33.5 g/dL (ref 32.0–36.0)
MCV: 94.3 fL (ref 79.3–98.0)
MONO#: 0.4 10*3/uL (ref 0.1–0.9)
MONO%: 10.3 % (ref 0.0–14.0)
NEUT#: 2.8 10*3/uL (ref 1.5–6.5)
NEUT%: 67.9 % (ref 39.0–75.0)
Platelets: 165 10*3/uL (ref 140–400)
RBC: 3.98 10*6/uL — AB (ref 4.20–5.82)
RDW: 15.4 % — ABNORMAL HIGH (ref 11.0–14.6)
WBC: 4.1 10*3/uL (ref 4.0–10.3)
lymph#: 0.8 10*3/uL — ABNORMAL LOW (ref 0.9–3.3)

## 2013-08-06 MED ORDER — BORTEZOMIB CHEMO SQ INJECTION 3.5 MG (2.5MG/ML)
1.5000 mg/m2 | Freq: Once | INTRAMUSCULAR | Status: AC
  Administered 2013-08-06: 3 mg via SUBCUTANEOUS
  Filled 2013-08-06: qty 3

## 2013-08-06 MED ORDER — ONDANSETRON HCL 8 MG PO TABS
8.0000 mg | ORAL_TABLET | Freq: Once | ORAL | Status: AC
  Administered 2013-08-06: 8 mg via ORAL

## 2013-08-06 MED ORDER — DEXAMETHASONE 4 MG PO TABS
40.0000 mg | ORAL_TABLET | Freq: Once | ORAL | Status: AC
  Administered 2013-08-06: 40 mg via ORAL

## 2013-08-06 MED ORDER — ONDANSETRON HCL 8 MG PO TABS
ORAL_TABLET | ORAL | Status: AC
  Filled 2013-08-06: qty 1

## 2013-08-06 NOTE — Patient Instructions (Signed)
[facility] Discharge Instructions for Patients Receiving Chemotherapy  Today you received the following chemotherapy agents: Velcade.  To help prevent nausea and vomiting after your treatment, we encourage you to take your nausea medication as prescribed.   If you develop nausea and vomiting that is not controlled by your nausea medication, call the clinic.   BELOW ARE SYMPTOMS THAT SHOULD BE REPORTED IMMEDIATELY:  *FEVER GREATER THAN 100.5 F  *CHILLS WITH OR WITHOUT FEVER  NAUSEA AND VOMITING THAT IS NOT CONTROLLED WITH YOUR NAUSEA MEDICATION  *UNUSUAL SHORTNESS OF BREATH  *UNUSUAL BRUISING OR BLEEDING  TENDERNESS IN MOUTH AND THROAT WITH OR WITHOUT PRESENCE OF ULCERS  *URINARY PROBLEMS  *BOWEL PROBLEMS  UNUSUAL RASH Items with * indicate a potential emergency and should be followed up as soon as possible.  Feel free to call the clinic you have any questions or concerns. The clinic phone number is [phone].    

## 2013-08-06 NOTE — Telephone Encounter (Signed)
pt camed in and request all early morning appts...done...printed and gv new appt sched to pt .

## 2013-08-10 ENCOUNTER — Encounter: Payer: Self-pay | Admitting: Internal Medicine

## 2013-08-10 LAB — SPEP & IFE WITH QIG
Albumin ELP: 60.9 % (ref 55.8–66.1)
Alpha-1-Globulin: 7.1 % — ABNORMAL HIGH (ref 2.9–4.9)
Alpha-2-Globulin: 9.8 % (ref 7.1–11.8)
Beta 2: 3.7 % (ref 3.2–6.5)
Beta Globulin: 7.5 % — ABNORMAL HIGH (ref 4.7–7.2)
Gamma Globulin: 11 % — ABNORMAL LOW (ref 11.1–18.8)
IgA: 67 mg/dL — ABNORMAL LOW (ref 68–379)
IgG (Immunoglobin G), Serum: 862 mg/dL (ref 650–1600)
IgM, Serum: 37 mg/dL — ABNORMAL LOW (ref 41–251)
M-Spike, %: 0.14 g/dL
Total Protein, Serum Electrophoresis: 7.1 g/dL (ref 6.0–8.3)

## 2013-08-10 LAB — KAPPA/LAMBDA LIGHT CHAINS
KAPPA LAMBDA RATIO: 1.22 (ref 0.26–1.65)
Kappa free light chain: 1.91 mg/dL (ref 0.33–1.94)
Lambda Free Lght Chn: 1.57 mg/dL (ref 0.57–2.63)

## 2013-08-13 ENCOUNTER — Ambulatory Visit (HOSPITAL_BASED_OUTPATIENT_CLINIC_OR_DEPARTMENT_OTHER): Payer: No Typology Code available for payment source

## 2013-08-13 ENCOUNTER — Other Ambulatory Visit (HOSPITAL_BASED_OUTPATIENT_CLINIC_OR_DEPARTMENT_OTHER): Payer: Medicaid Other

## 2013-08-13 ENCOUNTER — Ambulatory Visit: Payer: No Typology Code available for payment source

## 2013-08-13 ENCOUNTER — Other Ambulatory Visit: Payer: No Typology Code available for payment source

## 2013-08-13 ENCOUNTER — Other Ambulatory Visit: Payer: Self-pay | Admitting: Internal Medicine

## 2013-08-13 VITALS — BP 150/79 | HR 72 | Temp 98.4°F | Resp 18

## 2013-08-13 DIAGNOSIS — C9000 Multiple myeloma not having achieved remission: Secondary | ICD-10-CM

## 2013-08-13 LAB — CBC WITH DIFFERENTIAL/PLATELET
BASO%: 0.3 % (ref 0.0–2.0)
BASOS ABS: 0 10*3/uL (ref 0.0–0.1)
EOS ABS: 0.1 10*3/uL (ref 0.0–0.5)
EOS%: 2.6 % (ref 0.0–7.0)
HEMATOCRIT: 37.4 % — AB (ref 38.4–49.9)
HEMOGLOBIN: 12.7 g/dL — AB (ref 13.0–17.1)
LYMPH%: 22 % (ref 14.0–49.0)
MCH: 31.1 pg (ref 27.2–33.4)
MCHC: 34 g/dL (ref 32.0–36.0)
MCV: 91.7 fL (ref 79.3–98.0)
MONO#: 0.7 10*3/uL (ref 0.1–0.9)
MONO%: 18.6 % — ABNORMAL HIGH (ref 0.0–14.0)
NEUT%: 56.5 % (ref 39.0–75.0)
NEUTROS ABS: 2.2 10*3/uL (ref 1.5–6.5)
PLATELETS: 166 10*3/uL (ref 140–400)
RBC: 4.08 10*6/uL — ABNORMAL LOW (ref 4.20–5.82)
RDW: 14.2 % (ref 11.0–14.6)
WBC: 3.9 10*3/uL — AB (ref 4.0–10.3)
lymph#: 0.9 10*3/uL (ref 0.9–3.3)
nRBC: 1 % — ABNORMAL HIGH (ref 0–0)

## 2013-08-13 MED ORDER — DEXAMETHASONE 4 MG PO TABS
40.0000 mg | ORAL_TABLET | Freq: Once | ORAL | Status: AC
  Administered 2013-08-13: 40 mg via ORAL

## 2013-08-13 MED ORDER — DEXAMETHASONE 4 MG PO TABS
ORAL_TABLET | ORAL | Status: AC
  Filled 2013-08-13: qty 10

## 2013-08-13 MED ORDER — ONDANSETRON HCL 8 MG PO TABS
8.0000 mg | ORAL_TABLET | Freq: Once | ORAL | Status: AC
  Administered 2013-08-13: 8 mg via ORAL

## 2013-08-13 MED ORDER — ONDANSETRON HCL 8 MG PO TABS
ORAL_TABLET | ORAL | Status: AC
  Filled 2013-08-13: qty 1

## 2013-08-13 MED ORDER — BORTEZOMIB CHEMO SQ INJECTION 3.5 MG (2.5MG/ML)
1.5000 mg/m2 | Freq: Once | INTRAMUSCULAR | Status: AC
  Administered 2013-08-13: 3 mg via SUBCUTANEOUS
  Filled 2013-08-13: qty 3

## 2013-08-13 NOTE — Patient Instructions (Addendum)
[facility] Discharge Instructions for Patients Receiving Chemotherapy  Today you received the following chemotherapy agent: Velcade   To help prevent nausea and vomiting after your treatment, we encourage you to take your nausea medication as prescribed.   You reported feeling constipated. Dr. Juliann Mule has instructed you to buy metamucil (over the counter), you should not need a prescription for this.  You may use once daily, and hold for diarrhea.   If you develop nausea and vomiting that is not controlled by your nausea medication, call the clinic.   BELOW ARE SYMPTOMS THAT SHOULD BE REPORTED IMMEDIATELY:  *FEVER GREATER THAN 100.5 F  *CHILLS WITH OR WITHOUT FEVER  NAUSEA AND VOMITING THAT IS NOT CONTROLLED WITH YOUR NAUSEA MEDICATION  *UNUSUAL SHORTNESS OF BREATH  *UNUSUAL BRUISING OR BLEEDING  TENDERNESS IN MOUTH AND THROAT WITH OR WITHOUT PRESENCE OF ULCERS  *URINARY PROBLEMS  *BOWEL PROBLEMS  UNUSUAL RASH Items with * indicate a potential emergency and should be followed up as soon as possible.  Feel free to call the clinic you have any questions or concerns. The clinic phone number is [phone].

## 2013-08-13 NOTE — Progress Notes (Signed)
Patient c/o constipation today. He has rx for colace and mineral oil enema to which he feels he has become "immune." Pt had BM yesterday 08/12/13 but often uses epsom salt to keep bowels regular and he doesn't want to rely on this. Per Dr. Juliann Mule, patient may obtain metamucil over the counter, use once daily, and hold for diarrhea. Instructions given to patient and he verbalizes understanding.

## 2013-08-18 ENCOUNTER — Other Ambulatory Visit: Payer: Self-pay | Admitting: *Deleted

## 2013-08-18 DIAGNOSIS — C9000 Multiple myeloma not having achieved remission: Secondary | ICD-10-CM

## 2013-08-18 MED ORDER — LENALIDOMIDE 25 MG PO CAPS: ORAL_CAPSULE | ORAL | Status: DC

## 2013-08-18 NOTE — Telephone Encounter (Signed)
Unable to complete prescriber survey online. No issue with telephone authorization [phone]

## 2013-08-18 NOTE — Addendum Note (Signed)
Addended by: Tania Ade on: 08/18/2013 02:03 PM   Modules accepted: Orders

## 2013-08-18 NOTE — Telephone Encounter (Signed)
Refill request to MD desk 

## 2013-08-19 NOTE — Telephone Encounter (Signed)
RECEIVED A FAX FROM [facility] CONCERNING A CONFIRMATION OF FACSIMILE RECEIPT FOR PT. REFERRAL. 

## 2013-08-20 ENCOUNTER — Other Ambulatory Visit: Payer: Self-pay | Admitting: Internal Medicine

## 2013-08-20 ENCOUNTER — Ambulatory Visit (HOSPITAL_BASED_OUTPATIENT_CLINIC_OR_DEPARTMENT_OTHER): Payer: No Typology Code available for payment source

## 2013-08-20 ENCOUNTER — Ambulatory Visit: Payer: Self-pay

## 2013-08-20 ENCOUNTER — Other Ambulatory Visit: Payer: Self-pay

## 2013-08-20 ENCOUNTER — Other Ambulatory Visit (HOSPITAL_BASED_OUTPATIENT_CLINIC_OR_DEPARTMENT_OTHER): Payer: Medicaid Other

## 2013-08-20 VITALS — BP 140/88 | HR 66 | Temp 97.7°F | Resp 18

## 2013-08-20 DIAGNOSIS — C9000 Multiple myeloma not having achieved remission: Secondary | ICD-10-CM

## 2013-08-20 DIAGNOSIS — Z5112 Encounter for antineoplastic immunotherapy: Secondary | ICD-10-CM

## 2013-08-20 LAB — CBC WITH DIFFERENTIAL/PLATELET
BASO%: 0.3 % (ref 0.0–2.0)
BASOS ABS: 0 10*3/uL (ref 0.0–0.1)
EOS%: 2.1 % (ref 0.0–7.0)
Eosinophils Absolute: 0.1 10*3/uL (ref 0.0–0.5)
HCT: 34.9 % — ABNORMAL LOW (ref 38.4–49.9)
HEMOGLOBIN: 11.9 g/dL — AB (ref 13.0–17.1)
LYMPH%: 23.1 % (ref 14.0–49.0)
MCH: 31 pg (ref 27.2–33.4)
MCHC: 34.1 g/dL (ref 32.0–36.0)
MCV: 90.9 fL (ref 79.3–98.0)
MONO#: 0.8 10*3/uL (ref 0.1–0.9)
MONO%: 22.5 % — ABNORMAL HIGH (ref 0.0–14.0)
NEUT#: 1.7 10*3/uL (ref 1.5–6.5)
NEUT%: 52 % (ref 39.0–75.0)
PLATELETS: 170 10*3/uL (ref 140–400)
RBC: 3.84 10*6/uL — ABNORMAL LOW (ref 4.20–5.82)
RDW: 14.7 % — ABNORMAL HIGH (ref 11.0–14.6)
WBC: 3.3 10*3/uL — ABNORMAL LOW (ref 4.0–10.3)
lymph#: 0.8 10*3/uL — ABNORMAL LOW (ref 0.9–3.3)

## 2013-08-20 MED ORDER — DEXAMETHASONE 4 MG PO TABS
ORAL_TABLET | ORAL | Status: AC
  Filled 2013-08-20: qty 10

## 2013-08-20 MED ORDER — ONDANSETRON HCL 8 MG PO TABS
ORAL_TABLET | ORAL | Status: AC
  Filled 2013-08-20: qty 1

## 2013-08-20 MED ORDER — BORTEZOMIB CHEMO SQ INJECTION 3.5 MG (2.5MG/ML)
1.5000 mg/m2 | Freq: Once | INTRAMUSCULAR | Status: AC
  Administered 2013-08-20: 3 mg via SUBCUTANEOUS
  Filled 2013-08-20: qty 3

## 2013-08-20 MED ORDER — DEXAMETHASONE 4 MG PO TABS
40.0000 mg | ORAL_TABLET | Freq: Once | ORAL | Status: AC
  Administered 2013-08-20: 40 mg via ORAL

## 2013-08-20 MED ORDER — ONDANSETRON HCL 8 MG PO TABS
8.0000 mg | ORAL_TABLET | Freq: Once | ORAL | Status: AC
  Administered 2013-08-20: 8 mg via ORAL

## 2013-08-20 NOTE — Patient Instructions (Signed)
[facility] Discharge Instructions for Patients Receiving Chemotherapy  Today you received the following chemotherapy agents :  Decadron,  Velcade.  To help prevent nausea and vomiting after your treatment, we encourage you to take your nausea medication as instructed by your physician.   If you develop nausea and vomiting that is not controlled by your nausea medication, call the clinic.   BELOW ARE SYMPTOMS THAT SHOULD BE REPORTED IMMEDIATELY:  *FEVER GREATER THAN 100.5 F  *CHILLS WITH OR WITHOUT FEVER  NAUSEA AND VOMITING THAT IS NOT CONTROLLED WITH YOUR NAUSEA MEDICATION  *UNUSUAL SHORTNESS OF BREATH  *UNUSUAL BRUISING OR BLEEDING  TENDERNESS IN MOUTH AND THROAT WITH OR WITHOUT PRESENCE OF ULCERS  *URINARY PROBLEMS  *BOWEL PROBLEMS  UNUSUAL RASH Items with * indicate a potential emergency and should be followed up as soon as possible.  Feel free to call the clinic you have any questions or concerns. The clinic phone number is [phone].

## 2013-08-23 NOTE — Telephone Encounter (Signed)
RECEIVED A FAX FROM [facility] CONCERNING A CONFIRMATION OF PRESCRIPTION SHIPMENT FOR REVLIMID ON 08/20/13.

## 2013-08-27 ENCOUNTER — Ambulatory Visit (HOSPITAL_BASED_OUTPATIENT_CLINIC_OR_DEPARTMENT_OTHER): Payer: Medicaid Other | Admitting: Internal Medicine

## 2013-08-27 ENCOUNTER — Ambulatory Visit: Payer: Self-pay

## 2013-08-27 ENCOUNTER — Other Ambulatory Visit: Payer: Self-pay

## 2013-08-27 ENCOUNTER — Telehealth: Payer: Self-pay | Admitting: Internal Medicine

## 2013-08-27 ENCOUNTER — Other Ambulatory Visit (HOSPITAL_BASED_OUTPATIENT_CLINIC_OR_DEPARTMENT_OTHER): Payer: Medicaid Other

## 2013-08-27 ENCOUNTER — Telehealth: Payer: Self-pay | Admitting: *Deleted

## 2013-08-27 ENCOUNTER — Ambulatory Visit (HOSPITAL_BASED_OUTPATIENT_CLINIC_OR_DEPARTMENT_OTHER): Payer: No Typology Code available for payment source

## 2013-08-27 VITALS — BP 151/79 | HR 67 | Temp 98.5°F | Resp 18 | Ht 73.0 in | Wt 178.6 lb

## 2013-08-27 VITALS — BP 143/87 | HR 64 | Temp 97.6°F | Resp 18

## 2013-08-27 DIAGNOSIS — M545 Low back pain, unspecified: Secondary | ICD-10-CM

## 2013-08-27 DIAGNOSIS — Z5112 Encounter for antineoplastic immunotherapy: Secondary | ICD-10-CM

## 2013-08-27 DIAGNOSIS — C9000 Multiple myeloma not having achieved remission: Secondary | ICD-10-CM

## 2013-08-27 DIAGNOSIS — C61 Malignant neoplasm of prostate: Secondary | ICD-10-CM

## 2013-08-27 LAB — CBC WITH DIFFERENTIAL/PLATELET
BASO%: 0.9 % (ref 0.0–2.0)
Basophils Absolute: 0 10*3/uL (ref 0.0–0.1)
EOS%: 1.6 % (ref 0.0–7.0)
Eosinophils Absolute: 0 10*3/uL (ref 0.0–0.5)
HEMATOCRIT: 35.6 % — AB (ref 38.4–49.9)
HGB: 11.8 g/dL — ABNORMAL LOW (ref 13.0–17.1)
LYMPH%: 21.5 % (ref 14.0–49.0)
MCH: 31.4 pg (ref 27.2–33.4)
MCHC: 33.3 g/dL (ref 32.0–36.0)
MCV: 94.4 fL (ref 79.3–98.0)
MONO#: 0.6 10*3/uL (ref 0.1–0.9)
MONO%: 19.3 % — ABNORMAL HIGH (ref 0.0–14.0)
NEUT#: 1.7 10*3/uL (ref 1.5–6.5)
NEUT%: 56.7 % (ref 39.0–75.0)
PLATELETS: 174 10*3/uL (ref 140–400)
RBC: 3.77 10*6/uL — ABNORMAL LOW (ref 4.20–5.82)
RDW: 16.2 % — ABNORMAL HIGH (ref 11.0–14.6)
WBC: 3 10*3/uL — ABNORMAL LOW (ref 4.0–10.3)
lymph#: 0.6 10*3/uL — ABNORMAL LOW (ref 0.9–3.3)

## 2013-08-27 LAB — COMPREHENSIVE METABOLIC PANEL (CC13)
ALT: 23 U/L (ref 0–55)
ANION GAP: 9 meq/L (ref 3–11)
AST: 18 U/L (ref 5–34)
Albumin: 3.7 g/dL (ref 3.5–5.0)
Alkaline Phosphatase: 80 U/L (ref 40–150)
BUN: 12 mg/dL (ref 7.0–26.0)
CO2: 23 mEq/L (ref 22–29)
CREATININE: 0.8 mg/dL (ref 0.7–1.3)
Calcium: 9.3 mg/dL (ref 8.4–10.4)
Chloride: 106 mEq/L (ref 98–109)
Glucose: 97 mg/dl (ref 70–140)
Potassium: 4.1 mEq/L (ref 3.5–5.1)
Sodium: 138 mEq/L (ref 136–145)
Total Bilirubin: 0.34 mg/dL (ref 0.20–1.20)
Total Protein: 6.3 g/dL — ABNORMAL LOW (ref 6.4–8.3)

## 2013-08-27 MED ORDER — ONDANSETRON HCL 8 MG PO TABS
ORAL_TABLET | ORAL | Status: AC
  Filled 2013-08-27: qty 1

## 2013-08-27 MED ORDER — DEXAMETHASONE 4 MG PO TABS
40.0000 mg | ORAL_TABLET | Freq: Once | ORAL | Status: AC
  Administered 2013-08-27: 40 mg via ORAL

## 2013-08-27 MED ORDER — DEXAMETHASONE 4 MG PO TABS
ORAL_TABLET | ORAL | Status: AC
  Filled 2013-08-27: qty 10

## 2013-08-27 MED ORDER — BORTEZOMIB CHEMO SQ INJECTION 3.5 MG (2.5MG/ML)
1.5000 mg/m2 | Freq: Once | INTRAMUSCULAR | Status: AC
  Administered 2013-08-27: 3 mg via SUBCUTANEOUS
  Filled 2013-08-27: qty 3

## 2013-08-27 MED ORDER — ONDANSETRON HCL 8 MG PO TABS
8.0000 mg | ORAL_TABLET | Freq: Once | ORAL | Status: AC
  Administered 2013-08-27: 8 mg via ORAL

## 2013-08-27 NOTE — Telephone Encounter (Signed)
gave pt appt for lab and MD , emailed Sharyn Lull regarding chemo for February and MArch 2015

## 2013-08-27 NOTE — Telephone Encounter (Signed)
Talked to pt's wife's gave her appt for 2/20 and beyond  advise pt to get appt calenda

## 2013-08-27 NOTE — Patient Instructions (Signed)
[facility] Discharge Instructions for Patients Receiving Chemotherapy  Today you received the following chemotherapy agents :  Velcade.  To help prevent nausea and vomiting after your treatment, we encourage you to take your nausea medication as  Instructed by your physician.    If you develop nausea and vomiting that is not controlled by your nausea medication, call the clinic.   BELOW ARE SYMPTOMS THAT SHOULD BE REPORTED IMMEDIATELY:  *FEVER GREATER THAN 100.5 F  *CHILLS WITH OR WITHOUT FEVER  NAUSEA AND VOMITING THAT IS NOT CONTROLLED WITH YOUR NAUSEA MEDICATION  *UNUSUAL SHORTNESS OF BREATH  *UNUSUAL BRUISING OR BLEEDING  TENDERNESS IN MOUTH AND THROAT WITH OR WITHOUT PRESENCE OF ULCERS  *URINARY PROBLEMS  *BOWEL PROBLEMS  UNUSUAL RASH Items with * indicate a potential emergency and should be followed up as soon as possible.  Feel free to call the clinic you have any questions or concerns. The clinic phone number is [phone].

## 2013-08-27 NOTE — Telephone Encounter (Signed)
Per staff message and POF I have scheduled appts. Advised scheduler to move lab appts.  JMW  
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
## 2013-08-30 ENCOUNTER — Other Ambulatory Visit: Payer: Self-pay

## 2013-08-30 ENCOUNTER — Telehealth: Payer: Self-pay | Admitting: Internal Medicine

## 2013-08-30 ENCOUNTER — Ambulatory Visit: Payer: Self-pay

## 2013-08-30 LAB — IGG, IGA, IGM
IGA: 62 mg/dL — AB (ref 68–379)
IgG (Immunoglobin G), Serum: 696 mg/dL (ref 650–1600)
IgM, Serum: 30 mg/dL — ABNORMAL LOW (ref 41–251)

## 2013-08-30 LAB — KAPPA/LAMBDA LIGHT CHAINS
KAPPA FREE LGHT CHN: 1.31 mg/dL (ref 0.33–1.94)
Kappa:Lambda Ratio: 1.15 (ref 0.26–1.65)
Lambda Free Lght Chn: 1.14 mg/dL (ref 0.57–2.63)

## 2013-08-30 NOTE — Telephone Encounter (Signed)
s/w Tiffany in HIM re appt w/Dr. Melba Coon. HIM wll contact pt w/appt. no other orders per 2/15 pof.

## 2013-08-31 ENCOUNTER — Telehealth: Payer: Self-pay

## 2013-08-31 NOTE — Telephone Encounter (Signed)
Called pt in ref. To np appt. With Dr. Melba Coon. Spoke with pts wife in ref to referral, pt does not have ins. Pt declined referral to [facility] at this time.

## 2013-09-01 ENCOUNTER — Telehealth: Payer: Self-pay

## 2013-09-01 NOTE — Telephone Encounter (Signed)
Disregard last message-pt appt. To see Dr. Melba Coon @ [facility] is 10/01/13@10 :00. Pt is aware

## 2013-09-02 ENCOUNTER — Telehealth: Payer: Self-pay | Admitting: Internal Medicine

## 2013-09-02 NOTE — Telephone Encounter (Signed)
I spoke to Dr. Philipp Ovens and explained Mr. [place] insurance situation.  He instructed me to continue therapy for 8 cycles and if good response, start on maintance with revlimid

## 2013-09-03 ENCOUNTER — Other Ambulatory Visit: Payer: Self-pay | Admitting: Internal Medicine

## 2013-09-03 ENCOUNTER — Ambulatory Visit (HOSPITAL_BASED_OUTPATIENT_CLINIC_OR_DEPARTMENT_OTHER): Payer: No Typology Code available for payment source

## 2013-09-03 ENCOUNTER — Other Ambulatory Visit (HOSPITAL_BASED_OUTPATIENT_CLINIC_OR_DEPARTMENT_OTHER): Payer: Medicaid Other

## 2013-09-03 VITALS — BP 165/83 | HR 74 | Temp 98.6°F

## 2013-09-03 DIAGNOSIS — Z5112 Encounter for antineoplastic immunotherapy: Secondary | ICD-10-CM

## 2013-09-03 DIAGNOSIS — C9000 Multiple myeloma not having achieved remission: Secondary | ICD-10-CM

## 2013-09-03 LAB — CBC WITH DIFFERENTIAL/PLATELET
BASO%: 0.7 % (ref 0.0–2.0)
Basophils Absolute: 0 10*3/uL (ref 0.0–0.1)
EOS%: 2.3 % (ref 0.0–7.0)
Eosinophils Absolute: 0.1 10*3/uL (ref 0.0–0.5)
HCT: 38.2 % — ABNORMAL LOW (ref 38.4–49.9)
HGB: 12.8 g/dL — ABNORMAL LOW (ref 13.0–17.1)
LYMPH%: 21.2 % (ref 14.0–49.0)
MCH: 31.4 pg (ref 27.2–33.4)
MCHC: 33.4 g/dL (ref 32.0–36.0)
MCV: 94 fL (ref 79.3–98.0)
MONO#: 0.5 10*3/uL (ref 0.1–0.9)
MONO%: 12.2 % (ref 0.0–14.0)
NEUT#: 2.7 10*3/uL (ref 1.5–6.5)
NEUT%: 63.6 % (ref 39.0–75.0)
PLATELETS: 155 10*3/uL (ref 140–400)
RBC: 4.07 10*6/uL — ABNORMAL LOW (ref 4.20–5.82)
RDW: 16.1 % — ABNORMAL HIGH (ref 11.0–14.6)
WBC: 4.2 10*3/uL (ref 4.0–10.3)
lymph#: 0.9 10*3/uL (ref 0.9–3.3)

## 2013-09-03 LAB — BASIC METABOLIC PANEL (CC13)
ANION GAP: 10 meq/L (ref 3–11)
BUN: 13.7 mg/dL (ref 7.0–26.0)
CO2: 23 mEq/L (ref 22–29)
CREATININE: 0.9 mg/dL (ref 0.7–1.3)
Calcium: 8.9 mg/dL (ref 8.4–10.4)
Chloride: 103 mEq/L (ref 98–109)
Glucose: 107 mg/dl (ref 70–140)
Potassium: 3.5 mEq/L (ref 3.5–5.1)
Sodium: 137 mEq/L (ref 136–145)

## 2013-09-03 MED ORDER — DEXAMETHASONE 4 MG PO TABS
ORAL_TABLET | ORAL | Status: AC
  Filled 2013-09-03: qty 10

## 2013-09-03 MED ORDER — BORTEZOMIB CHEMO SQ INJECTION 3.5 MG (2.5MG/ML)
1.5000 mg/m2 | Freq: Once | INTRAMUSCULAR | Status: AC
  Administered 2013-09-03: 3 mg via SUBCUTANEOUS
  Filled 2013-09-03: qty 3

## 2013-09-03 MED ORDER — ONDANSETRON HCL 8 MG PO TABS
8.0000 mg | ORAL_TABLET | Freq: Once | ORAL | Status: AC
  Administered 2013-09-03: 8 mg via ORAL

## 2013-09-03 MED ORDER — ONDANSETRON HCL 8 MG PO TABS
ORAL_TABLET | ORAL | Status: AC
  Filled 2013-09-03: qty 1

## 2013-09-03 MED ORDER — DEXAMETHASONE 4 MG PO TABS
40.0000 mg | ORAL_TABLET | Freq: Once | ORAL | Status: AC
  Administered 2013-09-03: 40 mg via ORAL

## 2013-09-03 NOTE — Patient Instructions (Signed)
[facility] Discharge Instructions for Patients Receiving Chemotherapy  Today you received the following chemotherapy agents:  Velcade  To help prevent nausea and vomiting after your treatment, we encourage you to take your nausea medication as ordered per MD.   If you develop nausea and vomiting that is not controlled by your nausea medication, call the clinic.   BELOW ARE SYMPTOMS THAT SHOULD BE REPORTED IMMEDIATELY:  *FEVER GREATER THAN 100.5 F  *CHILLS WITH OR WITHOUT FEVER  NAUSEA AND VOMITING THAT IS NOT CONTROLLED WITH YOUR NAUSEA MEDICATION  *UNUSUAL SHORTNESS OF BREATH  *UNUSUAL BRUISING OR BLEEDING  TENDERNESS IN MOUTH AND THROAT WITH OR WITHOUT PRESENCE OF ULCERS  *URINARY PROBLEMS  *BOWEL PROBLEMS  UNUSUAL RASH Items with * indicate a potential emergency and should be followed up as soon as possible.  Feel free to call the clinic you have any questions or concerns. The clinic phone number is [phone].    

## 2013-09-09 ENCOUNTER — Encounter: Payer: Self-pay | Admitting: *Deleted
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
## 2013-09-10 ENCOUNTER — Ambulatory Visit (HOSPITAL_BASED_OUTPATIENT_CLINIC_OR_DEPARTMENT_OTHER): Payer: No Typology Code available for payment source

## 2013-09-10 ENCOUNTER — Other Ambulatory Visit (HOSPITAL_BASED_OUTPATIENT_CLINIC_OR_DEPARTMENT_OTHER): Payer: Medicaid Other

## 2013-09-10 VITALS — BP 143/84 | HR 75 | Temp 97.6°F | Resp 18

## 2013-09-10 DIAGNOSIS — C9000 Multiple myeloma not having achieved remission: Secondary | ICD-10-CM

## 2013-09-10 DIAGNOSIS — Z5112 Encounter for antineoplastic immunotherapy: Secondary | ICD-10-CM

## 2013-09-10 LAB — CBC WITH DIFFERENTIAL/PLATELET
BASO%: 0.8 % (ref 0.0–2.0)
Basophils Absolute: 0 10*3/uL (ref 0.0–0.1)
EOS ABS: 0.1 10*3/uL (ref 0.0–0.5)
EOS%: 3.1 % (ref 0.0–7.0)
HCT: 36.8 % — ABNORMAL LOW (ref 38.4–49.9)
HGB: 12.2 g/dL — ABNORMAL LOW (ref 13.0–17.1)
LYMPH%: 19.8 % (ref 14.0–49.0)
MCH: 31 pg (ref 27.2–33.4)
MCHC: 33.2 g/dL (ref 32.0–36.0)
MCV: 93.3 fL (ref 79.3–98.0)
MONO#: 0.6 10*3/uL (ref 0.1–0.9)
MONO%: 15.4 % — ABNORMAL HIGH (ref 0.0–14.0)
NEUT#: 2.5 10*3/uL (ref 1.5–6.5)
NEUT%: 60.9 % (ref 39.0–75.0)
PLATELETS: 150 10*3/uL (ref 140–400)
RBC: 3.94 10*6/uL — AB (ref 4.20–5.82)
RDW: 15.8 % — ABNORMAL HIGH (ref 11.0–14.6)
WBC: 4.2 10*3/uL (ref 4.0–10.3)
lymph#: 0.8 10*3/uL — ABNORMAL LOW (ref 0.9–3.3)

## 2013-09-10 LAB — BASIC METABOLIC PANEL (CC13)
Anion Gap: 8 mEq/L (ref 3–11)
BUN: 13.5 mg/dL (ref 7.0–26.0)
CALCIUM: 9.4 mg/dL (ref 8.4–10.4)
CO2: 22 mEq/L (ref 22–29)
Chloride: 106 mEq/L (ref 98–109)
Creatinine: 1.1 mg/dL (ref 0.7–1.3)
GLUCOSE: 105 mg/dL (ref 70–140)
Potassium: 3.9 mEq/L (ref 3.5–5.1)
SODIUM: 136 meq/L (ref 136–145)

## 2013-09-10 MED ORDER — ONDANSETRON HCL 8 MG PO TABS
ORAL_TABLET | ORAL | Status: AC
  Filled 2013-09-10: qty 1

## 2013-09-10 MED ORDER — DEXAMETHASONE 4 MG PO TABS
ORAL_TABLET | ORAL | Status: AC
  Filled 2013-09-10: qty 10

## 2013-09-10 MED ORDER — BORTEZOMIB CHEMO SQ INJECTION 3.5 MG (2.5MG/ML)
1.5000 mg/m2 | Freq: Once | INTRAMUSCULAR | Status: AC
  Administered 2013-09-10: 3 mg via SUBCUTANEOUS
  Filled 2013-09-10: qty 3

## 2013-09-10 MED ORDER — DEXAMETHASONE 4 MG PO TABS
40.0000 mg | ORAL_TABLET | Freq: Once | ORAL | Status: AC
  Administered 2013-09-10: 40 mg via ORAL

## 2013-09-10 MED ORDER — ONDANSETRON HCL 8 MG PO TABS
8.0000 mg | ORAL_TABLET | Freq: Once | ORAL | Status: AC
  Administered 2013-09-10: 8 mg via ORAL

## 2013-09-10 NOTE — Patient Instructions (Signed)
[facility] Discharge Instructions for Patients Receiving Chemotherapy  Today you received the following chemotherapy agents: Velcade  To help prevent nausea and vomiting after your treatment, we encourage you to take your nausea medication as prescribed by your physician. You received Zofran 8 mg and Dexamethasone 40 mg before your injection.   If you develop nausea and vomiting that is not controlled by your nausea medication, call the clinic.   BELOW ARE SYMPTOMS THAT SHOULD BE REPORTED IMMEDIATELY:  *FEVER GREATER THAN 100.5 F  *CHILLS WITH OR WITHOUT FEVER  NAUSEA AND VOMITING THAT IS NOT CONTROLLED WITH YOUR NAUSEA MEDICATION  *UNUSUAL SHORTNESS OF BREATH  *UNUSUAL BRUISING OR BLEEDING  TENDERNESS IN MOUTH AND THROAT WITH OR WITHOUT PRESENCE OF ULCERS  *URINARY PROBLEMS  *BOWEL PROBLEMS  UNUSUAL RASH Items with * indicate a potential emergency and should be followed up as soon as possible.  Feel free to call the clinic you have any questions or concerns. The clinic phone number is [phone].

## 2013-09-13 ENCOUNTER — Telehealth: Payer: Self-pay | Admitting: *Deleted

## 2013-09-13 ENCOUNTER — Other Ambulatory Visit: Payer: Self-pay | Admitting: *Deleted

## 2013-09-13 DIAGNOSIS — C9000 Multiple myeloma not having achieved remission: Secondary | ICD-10-CM

## 2013-09-13 MED ORDER — LENALIDOMIDE 25 MG PO CAPS: ORAL_CAPSULE | ORAL | Status: DC

## 2013-09-13 NOTE — Telephone Encounter (Signed)
Spoke with pt and instructed pt to call [employer] to take survey for Revlimid refill. Instructed pt to see Dr. Boyce Medici nurse on Friday 09/17/13 after lab draw to correct date of birth on [employer] registration forms.  Pt voiced understanding.

## 2013-09-17 ENCOUNTER — Ambulatory Visit (HOSPITAL_BASED_OUTPATIENT_CLINIC_OR_DEPARTMENT_OTHER): Payer: No Typology Code available for payment source

## 2013-09-17 ENCOUNTER — Other Ambulatory Visit: Payer: Self-pay | Admitting: Medical Oncology

## 2013-09-17 ENCOUNTER — Other Ambulatory Visit: Payer: Self-pay | Admitting: Internal Medicine

## 2013-09-17 ENCOUNTER — Other Ambulatory Visit (HOSPITAL_BASED_OUTPATIENT_CLINIC_OR_DEPARTMENT_OTHER): Payer: Medicaid Other

## 2013-09-17 VITALS — BP 145/88 | HR 64 | Temp 98.3°F

## 2013-09-17 DIAGNOSIS — C9000 Multiple myeloma not having achieved remission: Secondary | ICD-10-CM

## 2013-09-17 DIAGNOSIS — Z5112 Encounter for antineoplastic immunotherapy: Secondary | ICD-10-CM

## 2013-09-17 LAB — CBC WITH DIFFERENTIAL/PLATELET
BASO%: 0.2 % (ref 0.0–2.0)
Basophils Absolute: 0 10*3/uL (ref 0.0–0.1)
EOS ABS: 0.1 10*3/uL (ref 0.0–0.5)
EOS%: 1.5 % (ref 0.0–7.0)
HCT: 35.8 % — ABNORMAL LOW (ref 38.4–49.9)
HEMOGLOBIN: 12.2 g/dL — AB (ref 13.0–17.1)
LYMPH%: 24.8 % (ref 14.0–49.0)
MCH: 31 pg (ref 27.2–33.4)
MCHC: 34.1 g/dL (ref 32.0–36.0)
MCV: 90.9 fL (ref 79.3–98.0)
MONO#: 0.9 10*3/uL (ref 0.1–0.9)
MONO%: 18.2 % — AB (ref 0.0–14.0)
NEUT#: 2.6 10*3/uL (ref 1.5–6.5)
NEUT%: 55.3 % (ref 39.0–75.0)
PLATELETS: 194 10*3/uL (ref 140–400)
RBC: 3.94 10*6/uL — ABNORMAL LOW (ref 4.20–5.82)
RDW: 14.7 % — ABNORMAL HIGH (ref 11.0–14.6)
WBC: 4.7 10*3/uL (ref 4.0–10.3)
lymph#: 1.2 10*3/uL (ref 0.9–3.3)
nRBC: 0 % (ref 0–0)

## 2013-09-17 MED ORDER — DEXAMETHASONE 4 MG PO TABS
40.0000 mg | ORAL_TABLET | Freq: Once | ORAL | Status: AC
  Administered 2013-09-17: 40 mg via ORAL

## 2013-09-17 MED ORDER — DEXAMETHASONE 4 MG PO TABS
ORAL_TABLET | ORAL | Status: AC
  Filled 2013-09-17: qty 10

## 2013-09-17 MED ORDER — BORTEZOMIB CHEMO SQ INJECTION 3.5 MG (2.5MG/ML)
1.5000 mg/m2 | Freq: Once | INTRAMUSCULAR | Status: AC
  Administered 2013-09-17: 3 mg via SUBCUTANEOUS
  Filled 2013-09-17: qty 3

## 2013-09-17 MED ORDER — ONDANSETRON HCL 8 MG PO TABS
8.0000 mg | ORAL_TABLET | Freq: Once | ORAL | Status: AC
  Administered 2013-09-17: 8 mg via ORAL

## 2013-09-17 MED ORDER — ONDANSETRON HCL 8 MG PO TABS
ORAL_TABLET | ORAL | Status: AC
  Filled 2013-09-17: qty 1

## 2013-09-17 NOTE — Patient Instructions (Signed)
[facility] Discharge Instructions for Patients Receiving Chemotherapy  Today you received the following chemotherapy agents: Velcade.  To help prevent nausea and vomiting after your treatment, we encourage you to take your nausea medication as prescribed.   If you develop nausea and vomiting that is not controlled by your nausea medication, call the clinic.   BELOW ARE SYMPTOMS THAT SHOULD BE REPORTED IMMEDIATELY:  *FEVER GREATER THAN 100.5 F  *CHILLS WITH OR WITHOUT FEVER  NAUSEA AND VOMITING THAT IS NOT CONTROLLED WITH YOUR NAUSEA MEDICATION  *UNUSUAL SHORTNESS OF BREATH  *UNUSUAL BRUISING OR BLEEDING  TENDERNESS IN MOUTH AND THROAT WITH OR WITHOUT PRESENCE OF ULCERS  *URINARY PROBLEMS  *BOWEL PROBLEMS  UNUSUAL RASH Items with * indicate a potential emergency and should be followed up as soon as possible.  Feel free to call the clinic you have any questions or concerns. The clinic phone number is [phone].    

## 2013-09-20 NOTE — Telephone Encounter (Signed)
RECEIVED A FAX FROM [facility] CONCERNING A CONFIRMATION OF PRESCRIPTION SHIPMENT FOR REVLIMID ON 09/17/13. 

## 2013-09-24 ENCOUNTER — Encounter (HOSPITAL_COMMUNITY): Payer: Self-pay | Admitting: Emergency Medicine

## 2013-09-24 ENCOUNTER — Ambulatory Visit (HOSPITAL_BASED_OUTPATIENT_CLINIC_OR_DEPARTMENT_OTHER): Payer: Medicaid Other | Admitting: Internal Medicine

## 2013-09-24 ENCOUNTER — Other Ambulatory Visit (HOSPITAL_BASED_OUTPATIENT_CLINIC_OR_DEPARTMENT_OTHER): Payer: Medicaid Other

## 2013-09-24 ENCOUNTER — Emergency Department (HOSPITAL_COMMUNITY)
Admission: EM | Admit: 2013-09-24 | Discharge: 2013-09-24 | Discharge status: Against Medical Advice | Payer: No Typology Code available for payment source | Attending: Emergency Medicine | Admitting: Emergency Medicine

## 2013-09-24 ENCOUNTER — Ambulatory Visit (HOSPITAL_BASED_OUTPATIENT_CLINIC_OR_DEPARTMENT_OTHER): Payer: No Typology Code available for payment source

## 2013-09-24 VITALS — BP 153/78 | HR 76 | Temp 98.2°F | Resp 20 | Ht 73.0 in | Wt 177.8 lb

## 2013-09-24 DIAGNOSIS — I10 Essential (primary) hypertension: Secondary | ICD-10-CM | POA: Insufficient documentation

## 2013-09-24 DIAGNOSIS — Z5112 Encounter for antineoplastic immunotherapy: Secondary | ICD-10-CM

## 2013-09-24 DIAGNOSIS — R51 Headache: Secondary | ICD-10-CM | POA: Insufficient documentation

## 2013-09-24 DIAGNOSIS — C9000 Multiple myeloma not having achieved remission: Secondary | ICD-10-CM

## 2013-09-24 DIAGNOSIS — C439 Malignant melanoma of skin, unspecified: Secondary | ICD-10-CM | POA: Insufficient documentation

## 2013-09-24 DIAGNOSIS — C61 Malignant neoplasm of prostate: Secondary | ICD-10-CM

## 2013-09-24 DIAGNOSIS — Z5321 Procedure and treatment not carried out due to patient leaving prior to being seen by health care provider: Secondary | ICD-10-CM

## 2013-09-24 DIAGNOSIS — M545 Low back pain, unspecified: Secondary | ICD-10-CM

## 2013-09-24 LAB — BASIC METABOLIC PANEL (CC13)
Anion Gap: 10 mEq/L (ref 3–11)
BUN: 11.5 mg/dL (ref 7.0–26.0)
CO2: 22 mEq/L (ref 22–29)
Calcium: 9.3 mg/dL (ref 8.4–10.4)
Chloride: 109 mEq/L (ref 98–109)
Creatinine: 0.9 mg/dL (ref 0.7–1.3)
Glucose: 97 mg/dl (ref 70–140)
POTASSIUM: 3.9 meq/L (ref 3.5–5.1)
SODIUM: 141 meq/L (ref 136–145)

## 2013-09-24 LAB — CBC WITH DIFFERENTIAL/PLATELET
BASO%: 1 % (ref 0.0–2.0)
BASOS ABS: 0 10*3/uL (ref 0.0–0.1)
EOS%: 2.2 % (ref 0.0–7.0)
Eosinophils Absolute: 0.1 10*3/uL (ref 0.0–0.5)
HCT: 35.5 % — ABNORMAL LOW (ref 38.4–49.9)
HEMOGLOBIN: 12 g/dL — AB (ref 13.0–17.1)
LYMPH#: 0.7 10*3/uL — AB (ref 0.9–3.3)
LYMPH%: 18.6 % (ref 14.0–49.0)
MCH: 31.7 pg (ref 27.2–33.4)
MCHC: 33.8 g/dL (ref 32.0–36.0)
MCV: 93.7 fL (ref 79.3–98.0)
MONO#: 0.6 10*3/uL (ref 0.1–0.9)
MONO%: 15.7 % — ABNORMAL HIGH (ref 0.0–14.0)
NEUT%: 62.5 % (ref 39.0–75.0)
NEUTROS ABS: 2.4 10*3/uL (ref 1.5–6.5)
Platelets: 187 10*3/uL (ref 140–400)
RBC: 3.79 10*6/uL — ABNORMAL LOW (ref 4.20–5.82)
RDW: 16.2 % — ABNORMAL HIGH (ref 11.0–14.6)
WBC: 3.8 10*3/uL — ABNORMAL LOW (ref 4.0–10.3)

## 2013-09-24 MED ORDER — DEXAMETHASONE 4 MG PO TABS
ORAL_TABLET | ORAL | Status: AC
  Filled 2013-09-24: qty 10

## 2013-09-24 MED ORDER — BORTEZOMIB CHEMO SQ INJECTION 3.5 MG (2.5MG/ML)
1.5000 mg/m2 | Freq: Once | INTRAMUSCULAR | Status: AC
  Administered 2013-09-24: 3 mg via SUBCUTANEOUS
  Filled 2013-09-24: qty 3

## 2013-09-24 MED ORDER — DEXAMETHASONE 4 MG PO TABS
ORAL_TABLET | ORAL | Status: AC
  Filled 2013-09-24: qty 1

## 2013-09-24 MED ORDER — DEXAMETHASONE 4 MG PO TABS
40.0000 mg | ORAL_TABLET | Freq: Once | ORAL | Status: AC
  Administered 2013-09-24: 40 mg via ORAL

## 2013-09-24 MED ORDER — ONDANSETRON HCL 8 MG PO TABS
8.0000 mg | ORAL_TABLET | Freq: Once | ORAL | Status: AC
  Administered 2013-09-24: 8 mg via ORAL

## 2013-09-24 MED ORDER — ONDANSETRON HCL 8 MG PO TABS
ORAL_TABLET | ORAL | Status: AC
  Filled 2013-09-24: qty 1

## 2013-09-24 NOTE — ED Notes (Signed)
Pt never returned to hall bed.

## 2013-09-24 NOTE — Progress Notes (Signed)
[facility] CL spoke with patient about [employer]. Patient pcp on [address] is [place] [facility]. Patient declined CL offer to set him up with a follow-up apt with PCP at this time.

## 2013-09-24 NOTE — Patient Instructions (Signed)

## 2013-09-24 NOTE — ED Notes (Addendum)
Pt reports being in treatment for melanoma, which he has been in treatment for a year now. Pt presents today after being seen at the Cancer center. Pt has complaint of headache of the left temple. Pt states his oncologist looked at the area today and told him it could be sinus pressure, however referred him to the ED for evaluation. Facial mask applied to patient.

## 2013-09-24 NOTE — ED Notes (Addendum)
Pt walked down hallway 10 minutes ago. Has not returned to bed yet. PA went to see pt, but pt not there.

## 2013-09-24 NOTE — Patient Instructions (Signed)
[facility] Discharge Instructions for Patients Receiving Chemotherapy  Today you received the following chemotherapy agent: Velcade   To help prevent nausea and vomiting after your treatment, we encourage you to take your nausea medication as prescribed.    If you develop nausea and vomiting that is not controlled by your nausea medication, call the clinic.   BELOW ARE SYMPTOMS THAT SHOULD BE REPORTED IMMEDIATELY:  *FEVER GREATER THAN 100.5 F  *CHILLS WITH OR WITHOUT FEVER  NAUSEA AND VOMITING THAT IS NOT CONTROLLED WITH YOUR NAUSEA MEDICATION  *UNUSUAL SHORTNESS OF BREATH  *UNUSUAL BRUISING OR BLEEDING  TENDERNESS IN MOUTH AND THROAT WITH OR WITHOUT PRESENCE OF ULCERS  *URINARY PROBLEMS  *BOWEL PROBLEMS  UNUSUAL RASH Items with * indicate a potential emergency and should be followed up as soon as possible.  Feel free to call the clinic you have any questions or concerns. The clinic phone number is [phone].

## 2013-09-25 DIAGNOSIS — Z5321 Procedure and treatment not carried out due to patient leaving prior to being seen by health care provider: Secondary | ICD-10-CM

## 2013-09-25 NOTE — ED Provider Notes (Signed)
Pt left without being seen after triage.   Clinical Impression 1. Patient left without being seen      Blanchard Kelch, MD 09/25/13 1340
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
## 2013-09-27 ENCOUNTER — Other Ambulatory Visit: Payer: Self-pay

## 2013-09-27 LAB — KAPPA/LAMBDA LIGHT CHAINS
KAPPA FREE LGHT CHN: 1.1 mg/dL (ref 0.33–1.94)
KAPPA LAMBDA RATIO: 0.97 (ref 0.26–1.65)
Lambda Free Lght Chn: 1.13 mg/dL (ref 0.57–2.63)

## 2013-09-27 LAB — IGG, IGA, IGM
IGA: 76 mg/dL (ref 68–379)
IGG (IMMUNOGLOBIN G), SERUM: 694 mg/dL (ref 650–1600)
IgM, Serum: 30 mg/dL — ABNORMAL LOW (ref 41–251)

## 2013-09-28 ENCOUNTER — Telehealth: Payer: Self-pay | Admitting: *Deleted

## 2013-09-28 ENCOUNTER — Telehealth: Payer: Self-pay

## 2013-09-28 ENCOUNTER — Telehealth: Payer: Self-pay | Admitting: Medical Oncology

## 2013-09-28 NOTE — Telephone Encounter (Signed)
Per staff message and POF I have scheduled appts. Advised scheduler to move labs  JMW  

## 2013-09-28 NOTE — Telephone Encounter (Signed)
Pt appt. To see Dr. Melba Coon @ [facility] is 10/01/13@9 :57. Pt's wife is aware
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
## 2013-09-29 ENCOUNTER — Telehealth: Payer: Self-pay | Admitting: Internal Medicine

## 2013-09-29 NOTE — Telephone Encounter (Signed)
Talked to pt's wife and gave her appt for Friday 3/20 and April 2015 lab,md and chemo

## 2013-09-30 ENCOUNTER — Other Ambulatory Visit: Payer: Self-pay | Admitting: *Deleted

## 2013-10-01 ENCOUNTER — Ambulatory Visit (HOSPITAL_BASED_OUTPATIENT_CLINIC_OR_DEPARTMENT_OTHER): Payer: No Typology Code available for payment source

## 2013-10-01 ENCOUNTER — Other Ambulatory Visit: Payer: Self-pay | Admitting: Internal Medicine

## 2013-10-01 ENCOUNTER — Other Ambulatory Visit (HOSPITAL_BASED_OUTPATIENT_CLINIC_OR_DEPARTMENT_OTHER): Payer: Medicaid Other

## 2013-10-01 VITALS — BP 161/91 | HR 74 | Temp 97.5°F

## 2013-10-01 DIAGNOSIS — Z5112 Encounter for antineoplastic immunotherapy: Secondary | ICD-10-CM

## 2013-10-01 DIAGNOSIS — C9000 Multiple myeloma not having achieved remission: Secondary | ICD-10-CM

## 2013-10-01 LAB — CBC WITH DIFFERENTIAL/PLATELET
BASO%: 0.2 % (ref 0.0–2.0)
BASOS ABS: 0 10*3/uL (ref 0.0–0.1)
EOS ABS: 0.1 10*3/uL (ref 0.0–0.5)
EOS%: 1.5 % (ref 0.0–7.0)
HEMATOCRIT: 37.2 % — AB (ref 38.4–49.9)
HEMOGLOBIN: 12.6 g/dL — AB (ref 13.0–17.1)
LYMPH%: 16.7 % (ref 14.0–49.0)
MCH: 31 pg (ref 27.2–33.4)
MCHC: 33.9 g/dL (ref 32.0–36.0)
MCV: 91.6 fL (ref 79.3–98.0)
MONO#: 0.6 10*3/uL (ref 0.1–0.9)
MONO%: 13.2 % (ref 0.0–14.0)
NEUT#: 3.2 10*3/uL (ref 1.5–6.5)
NEUT%: 68.4 % (ref 39.0–75.0)
PLATELETS: 167 10*3/uL (ref 140–400)
RBC: 4.06 10*6/uL — ABNORMAL LOW (ref 4.20–5.82)
RDW: 14.7 % — AB (ref 11.0–14.6)
WBC: 4.6 10*3/uL (ref 4.0–10.3)
lymph#: 0.8 10*3/uL — ABNORMAL LOW (ref 0.9–3.3)

## 2013-10-01 MED ORDER — DEXAMETHASONE 4 MG PO TABS
40.0000 mg | ORAL_TABLET | Freq: Once | ORAL | Status: AC
  Administered 2013-10-01: 40 mg via ORAL

## 2013-10-01 MED ORDER — ONDANSETRON HCL 8 MG PO TABS
8.0000 mg | ORAL_TABLET | Freq: Once | ORAL | Status: AC
  Administered 2013-10-01: 8 mg via ORAL

## 2013-10-01 MED ORDER — BORTEZOMIB CHEMO SQ INJECTION 3.5 MG (2.5MG/ML)
1.5000 mg/m2 | Freq: Once | INTRAMUSCULAR | Status: AC
  Administered 2013-10-01: 3 mg via SUBCUTANEOUS
  Filled 2013-10-01: qty 3

## 2013-10-01 MED ORDER — ONDANSETRON HCL 8 MG PO TABS
ORAL_TABLET | ORAL | Status: AC
  Filled 2013-10-01: qty 1

## 2013-10-01 MED ORDER — DEXAMETHASONE 4 MG PO TABS
ORAL_TABLET | ORAL | Status: AC
  Filled 2013-10-01: qty 10

## 2013-10-05 ENCOUNTER — Other Ambulatory Visit: Payer: Self-pay | Admitting: *Deleted

## 2013-10-05 NOTE — Telephone Encounter (Signed)
THIS REFILL REQUEST FOR REVLIMID WAS GIVEN TO DR.CHISM'S NURSE, ROBIN BASS,RN. 

## 2013-10-06 ENCOUNTER — Other Ambulatory Visit: Payer: Self-pay | Admitting: *Deleted

## 2013-10-06 ENCOUNTER — Telehealth: Payer: Self-pay | Admitting: *Deleted

## 2013-10-06 DIAGNOSIS — C9000 Multiple myeloma not having achieved remission: Secondary | ICD-10-CM

## 2013-10-06 MED ORDER — LENALIDOMIDE 25 MG PO CAPS: 25.0000 mg | ORAL_CAPSULE | ORAL | Status: DC

## 2013-10-06 NOTE — Telephone Encounter (Signed)
Called pt at home and spoke with wife.  Instructed wife to have pt call [employer] to take survey for Revlimid refill.   Wife voiced understanding and stated she would relay message to pt.

## 2013-10-07 NOTE — Telephone Encounter (Signed)
RECEIVED A FAX FROM [facility] CONCERNING A CONFIRMATION OF PRESCRIPTION SHIPMENT FOR REVLIMID ON 10/06/13.

## 2013-10-08 ENCOUNTER — Other Ambulatory Visit: Payer: No Typology Code available for payment source

## 2013-10-08 ENCOUNTER — Other Ambulatory Visit: Payer: Self-pay | Admitting: Internal Medicine

## 2013-10-08 ENCOUNTER — Ambulatory Visit (HOSPITAL_BASED_OUTPATIENT_CLINIC_OR_DEPARTMENT_OTHER): Payer: No Typology Code available for payment source

## 2013-10-08 VITALS — BP 123/82 | HR 75 | Temp 98.1°F | Resp 20

## 2013-10-08 DIAGNOSIS — C9000 Multiple myeloma not having achieved remission: Secondary | ICD-10-CM

## 2013-10-08 DIAGNOSIS — Z5112 Encounter for antineoplastic immunotherapy: Secondary | ICD-10-CM

## 2013-10-08 LAB — CBC WITH DIFFERENTIAL/PLATELET
BASO%: 0.6 % (ref 0.0–2.0)
BASOS ABS: 0 10*3/uL (ref 0.0–0.1)
EOS ABS: 0.1 10*3/uL (ref 0.0–0.5)
EOS%: 2.8 % (ref 0.0–7.0)
HCT: 39 % (ref 38.4–49.9)
HGB: 13.1 g/dL (ref 13.0–17.1)
LYMPH%: 15.3 % (ref 14.0–49.0)
MCH: 32 pg (ref 27.2–33.4)
MCHC: 33.6 g/dL (ref 32.0–36.0)
MCV: 95.2 fL (ref 79.3–98.0)
MONO#: 0.9 10*3/uL (ref 0.1–0.9)
MONO%: 19.3 % — AB (ref 0.0–14.0)
NEUT%: 62 % (ref 39.0–75.0)
NEUTROS ABS: 2.9 10*3/uL (ref 1.5–6.5)
PLATELETS: 157 10*3/uL (ref 140–400)
RBC: 4.09 10*6/uL — ABNORMAL LOW (ref 4.20–5.82)
RDW: 15.5 % — ABNORMAL HIGH (ref 11.0–14.6)
WBC: 4.6 10*3/uL (ref 4.0–10.3)
lymph#: 0.7 10*3/uL — ABNORMAL LOW (ref 0.9–3.3)

## 2013-10-08 MED ORDER — BORTEZOMIB CHEMO SQ INJECTION 3.5 MG (2.5MG/ML)
1.5000 mg/m2 | Freq: Once | INTRAMUSCULAR | Status: AC
  Administered 2013-10-08: 3 mg via SUBCUTANEOUS
  Filled 2013-10-08: qty 3

## 2013-10-08 MED ORDER — ONDANSETRON HCL 8 MG PO TABS
ORAL_TABLET | ORAL | Status: AC
  Filled 2013-10-08: qty 1

## 2013-10-08 MED ORDER — ONDANSETRON HCL 8 MG PO TABS
8.0000 mg | ORAL_TABLET | Freq: Once | ORAL | Status: AC
  Administered 2013-10-08: 8 mg via ORAL

## 2013-10-08 MED ORDER — DEXAMETHASONE 4 MG PO TABS
40.0000 mg | ORAL_TABLET | Freq: Once | ORAL | Status: AC
  Administered 2013-10-08: 40 mg via ORAL

## 2013-10-08 NOTE — Patient Instructions (Signed)
[facility] Discharge Instructions for Patients Receiving Chemotherapy  Today you received the following chemotherapy agents: Velcade.  To help prevent nausea and vomiting after your treatment, we encourage you to take your nausea medication as prescribed.   If you develop nausea and vomiting that is not controlled by your nausea medication, call the clinic.   BELOW ARE SYMPTOMS THAT SHOULD BE REPORTED IMMEDIATELY:  *FEVER GREATER THAN 100.5 F  *CHILLS WITH OR WITHOUT FEVER  NAUSEA AND VOMITING THAT IS NOT CONTROLLED WITH YOUR NAUSEA MEDICATION  *UNUSUAL SHORTNESS OF BREATH  *UNUSUAL BRUISING OR BLEEDING  TENDERNESS IN MOUTH AND THROAT WITH OR WITHOUT PRESENCE OF ULCERS  *URINARY PROBLEMS  *BOWEL PROBLEMS  UNUSUAL RASH Items with * indicate a potential emergency and should be followed up as soon as possible.  Feel free to call the clinic you have any questions or concerns. The clinic phone number is [phone].    

## 2013-10-11 ENCOUNTER — Other Ambulatory Visit: Payer: Self-pay | Admitting: Medical Oncology

## 2013-10-15 ENCOUNTER — Ambulatory Visit (HOSPITAL_BASED_OUTPATIENT_CLINIC_OR_DEPARTMENT_OTHER): Payer: No Typology Code available for payment source

## 2013-10-15 ENCOUNTER — Ambulatory Visit (HOSPITAL_COMMUNITY)
Admission: RE | Admit: 2013-10-15 | Discharge: 2013-10-15 | Disposition: A | Discharge status: Home / Self Care | Payer: No Typology Code available for payment source | Source: Ambulatory Visit | Attending: Internal Medicine | Admitting: Internal Medicine

## 2013-10-15 ENCOUNTER — Other Ambulatory Visit (HOSPITAL_BASED_OUTPATIENT_CLINIC_OR_DEPARTMENT_OTHER): Payer: Medicaid Other

## 2013-10-15 VITALS — BP 148/89 | HR 64 | Temp 98.2°F | Resp 20

## 2013-10-15 DIAGNOSIS — Z5112 Encounter for antineoplastic immunotherapy: Secondary | ICD-10-CM

## 2013-10-15 DIAGNOSIS — C9000 Multiple myeloma not having achieved remission: Secondary | ICD-10-CM

## 2013-10-15 DIAGNOSIS — M479 Spondylosis, unspecified: Secondary | ICD-10-CM | POA: Insufficient documentation

## 2013-10-15 LAB — CBC WITH DIFFERENTIAL/PLATELET
BASO%: 0.7 % (ref 0.0–2.0)
Basophils Absolute: 0 10*3/uL (ref 0.0–0.1)
EOS%: 1.7 % (ref 0.0–7.0)
Eosinophils Absolute: 0.1 10*3/uL (ref 0.0–0.5)
HCT: 34.2 % — ABNORMAL LOW (ref 38.4–49.9)
HEMOGLOBIN: 11.8 g/dL — AB (ref 13.0–17.1)
LYMPH%: 16.1 % (ref 14.0–49.0)
MCH: 31.6 pg (ref 27.2–33.4)
MCHC: 34.5 g/dL (ref 32.0–36.0)
MCV: 91.7 fL (ref 79.3–98.0)
MONO#: 0.8 10*3/uL (ref 0.1–0.9)
MONO%: 18.3 % — AB (ref 0.0–14.0)
NEUT#: 2.6 10*3/uL (ref 1.5–6.5)
NEUT%: 63.2 % (ref 39.0–75.0)
PLATELETS: 186 10*3/uL (ref 140–400)
RBC: 3.73 10*6/uL — ABNORMAL LOW (ref 4.20–5.82)
RDW: 14.5 % (ref 11.0–14.6)
WBC: 4.1 10*3/uL (ref 4.0–10.3)
lymph#: 0.7 10*3/uL — ABNORMAL LOW (ref 0.9–3.3)

## 2013-10-15 MED ORDER — ONDANSETRON HCL 8 MG PO TABS
ORAL_TABLET | ORAL | Status: AC
  Filled 2013-10-15: qty 1

## 2013-10-15 MED ORDER — ONDANSETRON HCL 8 MG PO TABS
8.0000 mg | ORAL_TABLET | Freq: Once | ORAL | Status: AC
  Administered 2013-10-15: 8 mg via ORAL

## 2013-10-15 MED ORDER — BORTEZOMIB CHEMO SQ INJECTION 3.5 MG (2.5MG/ML)
1.5000 mg/m2 | Freq: Once | INTRAMUSCULAR | Status: AC
  Administered 2013-10-15: 3 mg via SUBCUTANEOUS
  Filled 2013-10-15: qty 3

## 2013-10-15 MED ORDER — DEXAMETHASONE 4 MG PO TABS
40.0000 mg | ORAL_TABLET | Freq: Once | ORAL | Status: AC
  Administered 2013-10-15: 40 mg via ORAL

## 2013-10-15 MED ORDER — DEXAMETHASONE 4 MG PO TABS
ORAL_TABLET | ORAL | Status: AC
  Filled 2013-10-15: qty 10

## 2013-10-15 NOTE — Patient Instructions (Signed)
[facility] Discharge Instructions for Patients Receiving Chemotherapy  Today you received the following chemotherapy agents Velcade.  To help prevent nausea and vomiting after your treatment, we encourage you to take your nausea medication as needed.   If you develop nausea and vomiting that is not controlled by your nausea medication, call the clinic.   BELOW ARE SYMPTOMS THAT SHOULD BE REPORTED IMMEDIATELY:  *FEVER GREATER THAN 100.5 F  *CHILLS WITH OR WITHOUT FEVER  NAUSEA AND VOMITING THAT IS NOT CONTROLLED WITH YOUR NAUSEA MEDICATION  *UNUSUAL SHORTNESS OF BREATH  *UNUSUAL BRUISING OR BLEEDING  TENDERNESS IN MOUTH AND THROAT WITH OR WITHOUT PRESENCE OF ULCERS  *URINARY PROBLEMS  *BOWEL PROBLEMS  UNUSUAL RASH Items with * indicate a potential emergency and should be followed up as soon as possible.  Feel free to call the clinic you have any questions or concerns. The clinic phone number is [phone].    

## 2013-10-18 ENCOUNTER — Other Ambulatory Visit: Payer: Self-pay

## 2013-10-22 ENCOUNTER — Ambulatory Visit (HOSPITAL_BASED_OUTPATIENT_CLINIC_OR_DEPARTMENT_OTHER): Payer: No Typology Code available for payment source

## 2013-10-22 ENCOUNTER — Other Ambulatory Visit: Payer: Self-pay | Admitting: Internal Medicine

## 2013-10-22 ENCOUNTER — Other Ambulatory Visit (HOSPITAL_BASED_OUTPATIENT_CLINIC_OR_DEPARTMENT_OTHER): Payer: Medicaid Other

## 2013-10-22 VITALS — BP 160/83 | HR 66 | Temp 98.1°F | Resp 18

## 2013-10-22 DIAGNOSIS — Z5112 Encounter for antineoplastic immunotherapy: Secondary | ICD-10-CM

## 2013-10-22 DIAGNOSIS — C9000 Multiple myeloma not having achieved remission: Secondary | ICD-10-CM

## 2013-10-22 LAB — COMPREHENSIVE METABOLIC PANEL (CC13)
ALT: 21 U/L (ref 0–55)
ANION GAP: 8 meq/L (ref 3–11)
AST: 21 U/L (ref 5–34)
Albumin: 3.6 g/dL (ref 3.5–5.0)
Alkaline Phosphatase: 93 U/L (ref 40–150)
BILIRUBIN TOTAL: 0.35 mg/dL (ref 0.20–1.20)
BUN: 11 mg/dL (ref 7.0–26.0)
CALCIUM: 9.1 mg/dL (ref 8.4–10.4)
CHLORIDE: 108 meq/L (ref 98–109)
CO2: 23 mEq/L (ref 22–29)
CREATININE: 0.9 mg/dL (ref 0.7–1.3)
GLUCOSE: 110 mg/dL (ref 70–140)
Potassium: 4.4 mEq/L (ref 3.5–5.1)
Sodium: 139 mEq/L (ref 136–145)
TOTAL PROTEIN: 6.3 g/dL — AB (ref 6.4–8.3)

## 2013-10-22 LAB — CBC WITH DIFFERENTIAL/PLATELET
BASO%: 0.4 % (ref 0.0–2.0)
Basophils Absolute: 0 10*3/uL (ref 0.0–0.1)
EOS%: 2 % (ref 0.0–7.0)
Eosinophils Absolute: 0.1 10*3/uL (ref 0.0–0.5)
HCT: 35.7 % — ABNORMAL LOW (ref 38.4–49.9)
HEMOGLOBIN: 11.8 g/dL — AB (ref 13.0–17.1)
LYMPH#: 0.5 10*3/uL — AB (ref 0.9–3.3)
LYMPH%: 12 % — ABNORMAL LOW (ref 14.0–49.0)
MCH: 31.7 pg (ref 27.2–33.4)
MCHC: 33.2 g/dL (ref 32.0–36.0)
MCV: 95.6 fL (ref 79.3–98.0)
MONO#: 0.4 10*3/uL (ref 0.1–0.9)
MONO%: 9.7 % (ref 0.0–14.0)
NEUT#: 3.4 10*3/uL (ref 1.5–6.5)
NEUT%: 75.9 % — ABNORMAL HIGH (ref 39.0–75.0)
PLATELETS: 187 10*3/uL (ref 140–400)
RBC: 3.73 10*6/uL — ABNORMAL LOW (ref 4.20–5.82)
RDW: 15.7 % — ABNORMAL HIGH (ref 11.0–14.6)
WBC: 4.5 10*3/uL (ref 4.0–10.3)

## 2013-10-22 LAB — LACTATE DEHYDROGENASE (CC13): LDH: 245 U/L (ref 125–245)

## 2013-10-22 MED ORDER — DEXAMETHASONE 4 MG PO TABS
40.0000 mg | ORAL_TABLET | Freq: Once | ORAL | Status: AC
  Administered 2013-10-22: 40 mg via ORAL

## 2013-10-22 MED ORDER — DEXAMETHASONE 4 MG PO TABS
ORAL_TABLET | ORAL | Status: AC
  Filled 2013-10-22: qty 5

## 2013-10-22 MED ORDER — ONDANSETRON HCL 8 MG PO TABS
ORAL_TABLET | ORAL | Status: AC
  Filled 2013-10-22: qty 1

## 2013-10-22 MED ORDER — BORTEZOMIB CHEMO SQ INJECTION 3.5 MG (2.5MG/ML)
1.5000 mg/m2 | Freq: Once | INTRAMUSCULAR | Status: AC
  Administered 2013-10-22: 3 mg via SUBCUTANEOUS
  Filled 2013-10-22: qty 3

## 2013-10-22 MED ORDER — ONDANSETRON HCL 8 MG PO TABS
8.0000 mg | ORAL_TABLET | Freq: Once | ORAL | Status: AC
  Administered 2013-10-22: 8 mg via ORAL

## 2013-10-22 NOTE — Patient Instructions (Signed)
[facility] Discharge Instructions for Patients Receiving Chemotherapy  Today you received the following chemotherapy agents: Velcade.  To help prevent nausea and vomiting after your treatment, we encourage you to take your nausea medication as prescribed.   If you develop nausea and vomiting that is not controlled by your nausea medication, call the clinic.   BELOW ARE SYMPTOMS THAT SHOULD BE REPORTED IMMEDIATELY:  *FEVER GREATER THAN 100.5 F  *CHILLS WITH OR WITHOUT FEVER  NAUSEA AND VOMITING THAT IS NOT CONTROLLED WITH YOUR NAUSEA MEDICATION  *UNUSUAL SHORTNESS OF BREATH  *UNUSUAL BRUISING OR BLEEDING  TENDERNESS IN MOUTH AND THROAT WITH OR WITHOUT PRESENCE OF ULCERS  *URINARY PROBLEMS  *BOWEL PROBLEMS  UNUSUAL RASH Items with * indicate a potential emergency and should be followed up as soon as possible.  Feel free to call the clinic you have any questions or concerns. The clinic phone number is [phone].    

## 2013-10-26 LAB — SPEP & IFE WITH QIG
ALPHA-2-GLOBULIN: 10.4 % (ref 7.1–11.8)
Albumin ELP: 62.3 % (ref 55.8–66.1)
Alpha-1-Globulin: 4.6 % (ref 2.9–4.9)
BETA GLOBULIN: 6.7 % (ref 4.7–7.2)
Beta 2: 5.1 % (ref 3.2–6.5)
Gamma Globulin: 10.9 % — ABNORMAL LOW (ref 11.1–18.8)
IgA: 63 mg/dL — ABNORMAL LOW (ref 68–379)
IgG (Immunoglobin G), Serum: 680 mg/dL (ref 650–1600)
IgM, Serum: 29 mg/dL — ABNORMAL LOW (ref 41–251)
TOTAL PROTEIN, SERUM ELECTROPHOR: 5.9 g/dL — AB (ref 6.0–8.3)

## 2013-10-26 LAB — KAPPA/LAMBDA LIGHT CHAINS
KAPPA FREE LGHT CHN: 1.85 mg/dL (ref 0.33–1.94)
Kappa:Lambda Ratio: 1.36 (ref 0.26–1.65)
Lambda Free Lght Chn: 1.36 mg/dL (ref 0.57–2.63)

## 2013-10-28 ENCOUNTER — Other Ambulatory Visit: Payer: Self-pay | Admitting: Internal Medicine

## 2013-10-28 DIAGNOSIS — C9000 Multiple myeloma not having achieved remission: Secondary | ICD-10-CM

## 2013-10-29 ENCOUNTER — Ambulatory Visit (HOSPITAL_BASED_OUTPATIENT_CLINIC_OR_DEPARTMENT_OTHER): Payer: Medicaid Other | Admitting: Internal Medicine

## 2013-10-29 ENCOUNTER — Other Ambulatory Visit: Payer: Self-pay

## 2013-10-29 ENCOUNTER — Encounter: Payer: Self-pay | Admitting: *Deleted

## 2013-10-29 ENCOUNTER — Other Ambulatory Visit (HOSPITAL_BASED_OUTPATIENT_CLINIC_OR_DEPARTMENT_OTHER): Payer: Medicaid Other

## 2013-10-29 ENCOUNTER — Telehealth: Payer: Self-pay | Admitting: Internal Medicine

## 2013-10-29 ENCOUNTER — Ambulatory Visit: Payer: No Typology Code available for payment source

## 2013-10-29 ENCOUNTER — Telehealth: Payer: Self-pay

## 2013-10-29 VITALS — BP 154/87 | HR 66 | Temp 97.4°F | Resp 18 | Ht 73.0 in | Wt 171.9 lb

## 2013-10-29 DIAGNOSIS — C61 Malignant neoplasm of prostate: Secondary | ICD-10-CM

## 2013-10-29 DIAGNOSIS — M545 Low back pain, unspecified: Secondary | ICD-10-CM

## 2013-10-29 DIAGNOSIS — C9000 Multiple myeloma not having achieved remission: Secondary | ICD-10-CM

## 2013-10-29 LAB — BASIC METABOLIC PANEL (CC13)
Anion Gap: 10 mEq/L (ref 3–11)
BUN: 14.5 mg/dL (ref 7.0–26.0)
CALCIUM: 9.6 mg/dL (ref 8.4–10.4)
CO2: 23 mEq/L (ref 22–29)
CREATININE: 0.9 mg/dL (ref 0.7–1.3)
Chloride: 105 mEq/L (ref 98–109)
Glucose: 100 mg/dl (ref 70–140)
Potassium: 4 mEq/L (ref 3.5–5.1)
Sodium: 138 mEq/L (ref 136–145)

## 2013-10-29 LAB — CBC WITH DIFFERENTIAL/PLATELET
BASO%: 0.3 % (ref 0.0–2.0)
Basophils Absolute: 0 10*3/uL (ref 0.0–0.1)
EOS%: 2.8 % (ref 0.0–7.0)
Eosinophils Absolute: 0.1 10*3/uL (ref 0.0–0.5)
HCT: 37.6 % — ABNORMAL LOW (ref 38.4–49.9)
HGB: 12.6 g/dL — ABNORMAL LOW (ref 13.0–17.1)
LYMPH%: 15 % (ref 14.0–49.0)
MCH: 31.4 pg (ref 27.2–33.4)
MCHC: 33.5 g/dL (ref 32.0–36.0)
MCV: 93.8 fL (ref 79.3–98.0)
MONO#: 0.6 10*3/uL (ref 0.1–0.9)
MONO%: 15.2 % — AB (ref 0.0–14.0)
NEUT#: 2.4 10*3/uL (ref 1.5–6.5)
NEUT%: 66.7 % (ref 39.0–75.0)
PLATELETS: 154 10*3/uL (ref 140–400)
RBC: 4.01 10*6/uL — ABNORMAL LOW (ref 4.20–5.82)
RDW: 13.9 % (ref 11.0–14.6)
WBC: 3.6 10*3/uL — ABNORMAL LOW (ref 4.0–10.3)
lymph#: 0.5 10*3/uL — ABNORMAL LOW (ref 0.9–3.3)

## 2013-10-29 MED ORDER — LENALIDOMIDE 10 MG PO CAPS: ORAL_CAPSULE | ORAL | Status: DC

## 2013-10-29 NOTE — Progress Notes (Signed)
RECEIVED A FAX FROM [facility] CONCERNING A CONFIRMATION OF FACSIMILE RECEIPT FOR PT. REFERRAL. 

## 2013-10-29 NOTE — Telephone Encounter (Signed)
No answer, no ability to leave message. Needs to call [employer] for prescriber survey
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
## 2013-10-29 NOTE — Telephone Encounter (Signed)
gv adn printed appts sched and avs for pt for May °

## 2013-11-01 ENCOUNTER — Ambulatory Visit: Payer: No Typology Code available for payment source

## 2013-11-01 ENCOUNTER — Telehealth: Payer: Self-pay

## 2013-11-01 ENCOUNTER — Other Ambulatory Visit: Payer: No Typology Code available for payment source

## 2013-11-01 NOTE — Progress Notes (Signed)
RECEIVED A FAX FROM [facility] CONCERNING A CONFIRMATION OF PRESCRIPTION SHIPMENT FOR REVLIMID ON 10/29/13. 

## 2013-11-01 NOTE — Telephone Encounter (Signed)
Attempted several times on Friday to contact pt. No ability to leave message. Message left this AM for pt to do [employer] pt. Survey. [phone] was a wrong number).

## 2013-11-02 LAB — UIFE/LIGHT CHAINS/TP QN, 24-HR UR
ALPHA 2 UR: DETECTED — AB
Albumin, U: DETECTED
Alpha 1, Urine: DETECTED — AB
BETA UR: DETECTED — AB
FREE KAPPA/LAMBDA RATIO: 8.88 ratio (ref 2.04–10.37)
Free Kappa Lt Chains,Ur: 1.51 mg/dL (ref 0.14–2.42)
Free Lambda Excretion/Day: 1.36 mg/d
Free Lambda Lt Chains,Ur: 0.17 mg/dL (ref 0.02–0.67)
Free Lt Chn Excr Rate: 12.08 mg/d
Gamma Globulin, Urine: DETECTED — AB
TOTAL PROTEIN, URINE-UPE24: 2.2 mg/dL
TOTAL PROTEIN, URINE-UR/DAY: 18 mg/d (ref 10–140)
Time: 24 hours
Volume, Urine: 800 mL

## 2013-11-15 MED ORDER — DEXAMETHASONE SODIUM PHOSPHATE 20 MG/5ML IJ SOLN
INTRAMUSCULAR | Status: AC
  Filled 2013-11-15: qty 5

## 2013-11-15 MED ORDER — PALONOSETRON HCL INJECTION 0.25 MG/5ML
INTRAVENOUS | Status: AC
Start: 2013-11-15 — End: 2013-11-15
  Filled 2013-11-15: qty 5

## 2013-11-25 ENCOUNTER — Other Ambulatory Visit: Payer: Self-pay | Admitting: *Deleted

## 2013-11-25 DIAGNOSIS — C61 Malignant neoplasm of prostate: Secondary | ICD-10-CM

## 2013-11-25 DIAGNOSIS — C9000 Multiple myeloma not having achieved remission: Secondary | ICD-10-CM

## 2013-11-25 NOTE — Telephone Encounter (Signed)
THIS REFILL REQUEST FOR REVLIMID WAS GIVEN TO DR.CHISM'S NURSE, KATHY BUYCK,RN. 

## 2013-11-26 ENCOUNTER — Telehealth: Payer: Self-pay | Admitting: Internal Medicine

## 2013-11-26 ENCOUNTER — Ambulatory Visit (HOSPITAL_BASED_OUTPATIENT_CLINIC_OR_DEPARTMENT_OTHER): Payer: No Typology Code available for payment source | Admitting: Internal Medicine

## 2013-11-26 ENCOUNTER — Other Ambulatory Visit (HOSPITAL_BASED_OUTPATIENT_CLINIC_OR_DEPARTMENT_OTHER): Payer: No Typology Code available for payment source

## 2013-11-26 VITALS — BP 126/51 | HR 91 | Temp 98.0°F | Resp 18 | Ht 73.0 in | Wt 133.1 lb

## 2013-11-26 DIAGNOSIS — C9000 Multiple myeloma not having achieved remission: Secondary | ICD-10-CM

## 2013-11-26 DIAGNOSIS — D649 Anemia, unspecified: Secondary | ICD-10-CM

## 2013-11-26 DIAGNOSIS — D709 Neutropenia, unspecified: Secondary | ICD-10-CM

## 2013-11-26 DIAGNOSIS — C61 Malignant neoplasm of prostate: Secondary | ICD-10-CM

## 2013-11-26 LAB — COMPREHENSIVE METABOLIC PANEL (CC13)
ALT: 22 U/L (ref 0–55)
AST: 21 U/L (ref 5–34)
Albumin: 4 g/dL (ref 3.5–5.0)
Alkaline Phosphatase: 90 U/L (ref 40–150)
Anion Gap: 11 mEq/L (ref 3–11)
BILIRUBIN TOTAL: 0.56 mg/dL (ref 0.20–1.20)
BUN: 7.2 mg/dL (ref 7.0–26.0)
CHLORIDE: 109 meq/L (ref 98–109)
CO2: 21 mEq/L — ABNORMAL LOW (ref 22–29)
CREATININE: 0.9 mg/dL (ref 0.7–1.3)
Calcium: 9.6 mg/dL (ref 8.4–10.4)
GLUCOSE: 117 mg/dL (ref 70–140)
Potassium: 3.8 mEq/L (ref 3.5–5.1)
Sodium: 141 mEq/L (ref 136–145)
Total Protein: 7 g/dL (ref 6.4–8.3)

## 2013-11-26 LAB — CBC WITH DIFFERENTIAL/PLATELET
BASO%: 1 % (ref 0.0–2.0)
BASOS ABS: 0 10*3/uL (ref 0.0–0.1)
EOS ABS: 0 10*3/uL (ref 0.0–0.5)
EOS%: 1.3 % (ref 0.0–7.0)
HEMATOCRIT: 38.7 % (ref 38.4–49.9)
HEMOGLOBIN: 12.9 g/dL — AB (ref 13.0–17.1)
LYMPH#: 0.8 10*3/uL — AB (ref 0.9–3.3)
LYMPH%: 30.3 % (ref 14.0–49.0)
MCH: 31.7 pg (ref 27.2–33.4)
MCHC: 33.4 g/dL (ref 32.0–36.0)
MCV: 95 fL (ref 79.3–98.0)
MONO#: 0.3 10*3/uL (ref 0.1–0.9)
MONO%: 11.1 % (ref 0.0–14.0)
NEUT%: 56.3 % (ref 39.0–75.0)
NEUTROS ABS: 1.4 10*3/uL — AB (ref 1.5–6.5)
PLATELETS: 152 10*3/uL (ref 140–400)
RBC: 4.07 10*6/uL — ABNORMAL LOW (ref 4.20–5.82)
RDW: 14.8 % — ABNORMAL HIGH (ref 11.0–14.6)
WBC: 2.5 10*3/uL — AB (ref 4.0–10.3)

## 2013-11-26 LAB — LACTATE DEHYDROGENASE (CC13): LDH: 219 U/L (ref 125–245)

## 2013-11-26 MED ORDER — HEPARIN SOD (PORK) LOCK FLUSH 100 UNIT/ML IV SOLN
500.0000 [IU] | Freq: Once | INTRAVENOUS | Status: DC
  Filled 2013-11-26: qty 5

## 2013-11-26 MED ORDER — SODIUM CHLORIDE 0.9 % IJ SOLN
10.0000 mL | INTRAMUSCULAR | Status: DC | PRN
Start: 2013-11-26 — End: 2013-11-26
  Filled 2013-11-26: qty 10

## 2013-11-26 MED ORDER — LENALIDOMIDE 10 MG PO CAPS: ORAL_CAPSULE | ORAL | Status: DC

## 2013-11-26 NOTE — Progress Notes (Signed)
Attempted port access for flush using ice for numbing. Did not access port on 1st attempt. Pt requested to get a flush appt so he could use his EMLA cream.

## 2013-11-26 NOTE — Addendum Note (Signed)
Addended by: Wyonia Hough on: 11/26/2013 10:45 AM   Modules accepted: Orders

## 2013-11-26 NOTE — Telephone Encounter (Signed)
RECEIVED A FAX FROM [facility] CONCERNING A CONFIRMATION OF FACSIMILE RECEIPT FOR PT. REFERRAL. 

## 2013-11-26 NOTE — Telephone Encounter (Signed)
gv adnprnted aptps ched and avs for pt for May, June and July
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
## 2013-11-29 ENCOUNTER — Encounter: Payer: Self-pay | Admitting: Internal Medicine

## 2013-11-29 ENCOUNTER — Ambulatory Visit (HOSPITAL_BASED_OUTPATIENT_CLINIC_OR_DEPARTMENT_OTHER): Payer: Medicaid Other

## 2013-11-29 VITALS — BP 143/80 | HR 60 | Temp 98.2°F

## 2013-11-29 DIAGNOSIS — Z452 Encounter for adjustment and management of vascular access device: Secondary | ICD-10-CM

## 2013-11-29 DIAGNOSIS — Z95828 Presence of other vascular implants and grafts: Secondary | ICD-10-CM

## 2013-11-29 DIAGNOSIS — C9000 Multiple myeloma not having achieved remission: Secondary | ICD-10-CM

## 2013-11-29 LAB — IGG, IGA, IGM
IGG (IMMUNOGLOBIN G), SERUM: 817 mg/dL (ref 650–1600)
IGM, SERUM: 63 mg/dL (ref 41–251)
IgA: 97 mg/dL (ref 68–379)

## 2013-11-29 LAB — KAPPA/LAMBDA LIGHT CHAINS
KAPPA FREE LGHT CHN: 1.12 mg/dL (ref 0.33–1.94)
Kappa:Lambda Ratio: 0.73 (ref 0.26–1.65)
Lambda Free Lght Chn: 1.53 mg/dL (ref 0.57–2.63)

## 2013-11-29 MED ORDER — SODIUM CHLORIDE 0.9 % IJ SOLN
10.0000 mL | INTRAMUSCULAR | Status: DC | PRN
  Administered 2013-11-29: 10 mL via INTRAVENOUS
  Filled 2013-11-29: qty 10

## 2013-11-29 MED ORDER — HEPARIN SOD (PORK) LOCK FLUSH 100 UNIT/ML IV SOLN
500.0000 [IU] | Freq: Once | INTRAVENOUS | Status: AC
  Administered 2013-11-29: 500 [IU] via INTRAVENOUS
  Filled 2013-11-29: qty 5

## 2013-11-29 NOTE — Patient Instructions (Signed)

## 2013-11-29 NOTE — Progress Notes (Signed)
Wasn't able to get Medicaid e-verified on EPIC so called DSS to check eligibility.  Rep informed me that pt is active 11/12/13 to 11/12/14.

## 2013-11-30 NOTE — Telephone Encounter (Signed)
RECEIVED A FAX FROM [facility] CONCERNING A CONFIRMATION OF PRESCRIPTION SHIPMENT FOR REVLIMID ON 11/29/13. 

## 2013-12-13 ENCOUNTER — Emergency Department (HOSPITAL_COMMUNITY)
Admission: EM | Admit: 2013-12-13 | Discharge: 2013-12-13 | Disposition: A | Discharge status: Home / Self Care | Payer: Medicaid Other | Attending: Emergency Medicine | Admitting: Emergency Medicine

## 2013-12-13 ENCOUNTER — Encounter (HOSPITAL_COMMUNITY): Payer: Self-pay | Admitting: Emergency Medicine

## 2013-12-13 DIAGNOSIS — Z862 Personal history of diseases of the blood and blood-forming organs and certain disorders involving the immune mechanism: Secondary | ICD-10-CM | POA: Insufficient documentation

## 2013-12-13 DIAGNOSIS — Z8701 Personal history of pneumonia (recurrent): Secondary | ICD-10-CM | POA: Insufficient documentation

## 2013-12-13 DIAGNOSIS — M5137 Other intervertebral disc degeneration, lumbosacral region: Secondary | ICD-10-CM | POA: Insufficient documentation

## 2013-12-13 DIAGNOSIS — G8929 Other chronic pain: Secondary | ICD-10-CM | POA: Insufficient documentation

## 2013-12-13 DIAGNOSIS — M51379 Other intervertebral disc degeneration, lumbosacral region without mention of lumbar back pain or lower extremity pain: Secondary | ICD-10-CM | POA: Insufficient documentation

## 2013-12-13 DIAGNOSIS — Z79899 Other long term (current) drug therapy: Secondary | ICD-10-CM | POA: Insufficient documentation

## 2013-12-13 DIAGNOSIS — Z8546 Personal history of malignant neoplasm of prostate: Secondary | ICD-10-CM | POA: Insufficient documentation

## 2013-12-13 DIAGNOSIS — Z85820 Personal history of malignant melanoma of skin: Secondary | ICD-10-CM | POA: Insufficient documentation

## 2013-12-13 DIAGNOSIS — I10 Essential (primary) hypertension: Secondary | ICD-10-CM | POA: Insufficient documentation

## 2013-12-13 DIAGNOSIS — M549 Dorsalgia, unspecified: Secondary | ICD-10-CM

## 2013-12-13 DIAGNOSIS — R011 Cardiac murmur, unspecified: Secondary | ICD-10-CM | POA: Insufficient documentation

## 2013-12-13 DIAGNOSIS — M545 Low back pain, unspecified: Secondary | ICD-10-CM | POA: Insufficient documentation

## 2013-12-13 DIAGNOSIS — Z8719 Personal history of other diseases of the digestive system: Secondary | ICD-10-CM | POA: Insufficient documentation

## 2013-12-13 DIAGNOSIS — Z7982 Long term (current) use of aspirin: Secondary | ICD-10-CM | POA: Insufficient documentation
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
## 2013-12-13 NOTE — Discharge Instructions (Signed)
Follow up with your primary care doctor.  Chronic Back Pain  When back pain lasts longer than 3 months, it is called chronic back pain.People with chronic back pain often go through certain periods that are more intense (flare-ups).  CAUSES Chronic back pain can be caused by wear and tear (degeneration) on different structures in your back. These structures include:  The bones of your spine (vertebrae) and the joints surrounding your spinal cord and nerve roots (facets).  The strong, fibrous tissues that connect your vertebrae (ligaments). Degeneration of these structures may result in pressure on your nerves. This can lead to constant pain. HOME CARE INSTRUCTIONS  Avoid bending, heavy lifting, prolonged sitting, and activities which make the problem worse.  Take brief periods of rest throughout the day to reduce your pain. Lying down or standing usually is better than sitting while you are resting.  Take over-the-counter or prescription medicines only as directed by your caregiver. SEEK IMMEDIATE MEDICAL CARE IF:   You have weakness or numbness in one of your legs or feet.  You have trouble controlling your bladder or bowels.  You have nausea, vomiting, abdominal pain, shortness of breath, or fainting. Document Released: 08/08/2004 Document Revised: 09/23/2011 Document Reviewed: 06/15/2011 [facility] Patient Information 2014 [place], [place].

## 2013-12-13 NOTE — ED Notes (Signed)
Pt here for chronic lower back pain

## 2013-12-13 NOTE — ED Provider Notes (Signed)
Medical screening examination/treatment/procedure(s) were performed by non-physician practitioner and as supervising physician I was immediately available for consultation/collaboration.   EKG Interpretation None       Orlie Dakin, MD 12/13/13 1537

## 2013-12-24 ENCOUNTER — Ambulatory Visit: Payer: No Typology Code available for payment source

## 2013-12-24 ENCOUNTER — Other Ambulatory Visit: Payer: No Typology Code available for payment source

## 2013-12-24 VITALS — BP 142/71 | HR 85 | Temp 98.2°F | Resp 16

## 2013-12-24 DIAGNOSIS — C9000 Multiple myeloma not having achieved remission: Secondary | ICD-10-CM

## 2013-12-24 DIAGNOSIS — Z95828 Presence of other vascular implants and grafts: Secondary | ICD-10-CM

## 2013-12-24 LAB — CBC WITH DIFFERENTIAL/PLATELET
BASO%: 0.4 % (ref 0.0–2.0)
BASOS ABS: 0 10*3/uL (ref 0.0–0.1)
EOS%: 2.1 % (ref 0.0–7.0)
Eosinophils Absolute: 0.1 10*3/uL (ref 0.0–0.5)
HCT: 38.9 % (ref 38.4–49.9)
HEMOGLOBIN: 13.2 g/dL (ref 13.0–17.1)
LYMPH#: 1 10*3/uL (ref 0.9–3.3)
LYMPH%: 36.4 % (ref 14.0–49.0)
MCH: 31.2 pg (ref 27.2–33.4)
MCHC: 33.9 g/dL (ref 32.0–36.0)
MCV: 92 fL (ref 79.3–98.0)
MONO#: 0.5 10*3/uL (ref 0.1–0.9)
MONO%: 16.1 % — ABNORMAL HIGH (ref 0.0–14.0)
NEUT#: 1.3 10*3/uL — ABNORMAL LOW (ref 1.5–6.5)
NEUT%: 45 % (ref 39.0–75.0)
Platelets: 165 10*3/uL (ref 140–400)
RBC: 4.23 10*6/uL (ref 4.20–5.82)
RDW: 13.5 % (ref 11.0–14.6)
WBC: 2.8 10*3/uL — AB (ref 4.0–10.3)

## 2013-12-24 MED ORDER — HEPARIN SOD (PORK) LOCK FLUSH 100 UNIT/ML IV SOLN
500.0000 [IU] | Freq: Once | INTRAVENOUS | Status: AC
  Administered 2013-12-24: 500 [IU] via INTRAVENOUS
  Filled 2013-12-24: qty 5

## 2013-12-24 MED ORDER — SODIUM CHLORIDE 0.9 % IJ SOLN
10.0000 mL | INTRAMUSCULAR | Status: DC | PRN
  Administered 2013-12-24: 10 mL via INTRAVENOUS
  Filled 2013-12-24: qty 10

## 2013-12-24 NOTE — Progress Notes (Signed)
Patient was accessed via [facility], Patient had no complaints prior to access, patient had numbing cream on 1.5 hours prior to access. Patient complained once accessed  that site was painful. Patient flushed with saline and deaccessed at this time. Patient has some swelling around port site.Tinge of blood return was noted. Amy Horton,RN observed patient site, Patient to get labs from arm and Desk Nurse to be called to set up a scan. Cira Rue, RN called, states she will discuss with Dr.Chism. Patient was sent to sit out in the lobby until further notice. Patient stated he was unable to go to radiology today due to other appts he had scheduled. Patient agreed to putting ice on port site and letting Shirlean Mylar, RN access his port. Patient tolerated well. Blood return was noted. No swelling, no redness. Patient denies any pain or shows any signs of distress at this time. Patient flushed with saline and heparin and deaccessed.Patient denies any questions or concerns at this time. Informed patient to call with any concerns.

## 2013-12-24 NOTE — Patient Instructions (Signed)

## 2013-12-28 ENCOUNTER — Other Ambulatory Visit: Payer: Self-pay | Admitting: *Deleted

## 2013-12-28 DIAGNOSIS — C61 Malignant neoplasm of prostate: Secondary | ICD-10-CM

## 2013-12-28 DIAGNOSIS — C9000 Multiple myeloma not having achieved remission: Secondary | ICD-10-CM

## 2013-12-28 MED ORDER — LENALIDOMIDE 10 MG PO CAPS: ORAL_CAPSULE | ORAL | Status: DC

## 2013-12-28 NOTE — Telephone Encounter (Signed)
THIS REFILL REQUEST FOR REVLIMID WAS GIVEN TO DR.CHISM'S NURSE, ROBIN BASS,RN. 

## 2013-12-28 NOTE — Addendum Note (Signed)
Addended by: Wyonia Hough on: 12/28/2013 03:35 PM   Modules accepted: Orders

## 2013-12-29 ENCOUNTER — Telehealth: Payer: Self-pay | Admitting: *Deleted

## 2013-12-29 NOTE — Telephone Encounter (Signed)
Called patient to introduce myself as Prostate Oncology Navigator and coordinator of the Prostate [place], to confirm his referral for the clinic on 01/04/14, location of [place], arrival time of 12:30, registration procedure, and format of clinic.  He verbalized understanding.  I provided my phone number and encouraged him to call me if he has any questions after receiving the Information Packet or prior to my call the day before clinic.  He verbalized understanding and expressed appreciation for my call.  Gayleen Orem, RN, BSN, [facility] Prostate Oncology Navigator [phone]

## 2013-12-30 ENCOUNTER — Encounter: Payer: Self-pay | Admitting: Radiation Oncology

## 2013-12-30 NOTE — Progress Notes (Signed)
GU Location of Tumor / Histology: prostate: adenocarcinoma  If Prostate Cancer, Gleason Score is (3 + 4) and PSA is (13.20 on 11/30/13)  Patient presented 14 months ago with signs/symptoms of: positive biopsy 10/2012  Biopsies of prostate (if applicable) revealed:  5/78/46 Volume 24 Gm    Past/Anticipated interventions by urology, if any: active surveillance  Past/Anticipated interventions by medical oncology, if any: none for prostate cancer, pt is treated by Dr Ralene Ok for mult myeloma 2011  Weight changes, if any: no  Bowel/Bladder complaints, if any:  IPSS 30  Nausea/Vomiting, if any: no  Pain issues, if any:  no  SAFETY ISSUES:  Prior radiation? no  Pacemaker/ICD? no  Possible current pregnancy? na  Is the patient on methotrexate? no  Current Complaints / other details:  Married, 1 son, floor tech, paternal hx of prostate cancer, disabled

## 2013-12-31 ENCOUNTER — Telehealth: Payer: Self-pay | Admitting: Oncology

## 2013-12-31 NOTE — Telephone Encounter (Signed)
C/D 12/31/13 for appt. 01/04/14

## 2014-01-04 ENCOUNTER — Telehealth: Payer: Self-pay | Admitting: *Deleted

## 2014-01-04 ENCOUNTER — Ambulatory Visit
Admission: RE | Admit: 2014-01-04 | Discharge: 2014-01-04 | Disposition: A | Discharge status: Home / Self Care | Payer: Medicaid Other | Source: Ambulatory Visit | Attending: Radiation Oncology | Admitting: Radiation Oncology

## 2014-01-04 ENCOUNTER — Encounter: Payer: Self-pay | Admitting: *Deleted

## 2014-01-04 ENCOUNTER — Encounter: Payer: Self-pay | Admitting: Radiation Oncology

## 2014-01-04 ENCOUNTER — Encounter: Payer: Self-pay | Admitting: Specialist

## 2014-01-04 ENCOUNTER — Encounter: Payer: Self-pay | Admitting: Oncology

## 2014-01-04 ENCOUNTER — Ambulatory Visit (HOSPITAL_BASED_OUTPATIENT_CLINIC_OR_DEPARTMENT_OTHER): Payer: No Typology Code available for payment source | Admitting: Oncology

## 2014-01-04 VITALS — BP 142/82 | HR 67 | Temp 97.9°F | Resp 20 | Ht 73.5 in | Wt 170.3 lb

## 2014-01-04 DIAGNOSIS — C9001 Multiple myeloma in remission: Secondary | ICD-10-CM

## 2014-01-04 DIAGNOSIS — C9000 Multiple myeloma not having achieved remission: Secondary | ICD-10-CM

## 2014-01-04 DIAGNOSIS — C61 Malignant neoplasm of prostate: Secondary | ICD-10-CM

## 2014-01-04 HISTORY — DX: Dorsalgia, unspecified: M54.9

## 2014-01-04 HISTORY — DX: Unspecified disorder of nose and nasal sinuses: J34.9

## 2014-01-04 HISTORY — DX: Multiple myeloma not having achieved remission: C90.00

## 2014-01-04 HISTORY — DX: Malignant neoplasm of prostate: C61
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
## 2014-01-04 NOTE — Progress Notes (Signed)
Met with patient and spouse. Patient is a Multiple Myeloma patient. He said that his main concern at the clinic was for information. He is a "man of faith" and his faith is a main coping mechanism for him. He was aware of our support programs but does not utilize them at the moment. Offered support to them both and provided them with information about support center programs and services. Listening.  Epifania Gore, PhD, [place]

## 2014-01-04 NOTE — Progress Notes (Signed)
Please see consult note.  
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
## 2014-01-04 NOTE — Progress Notes (Signed)
Met with patient and his wife during Prostate [facility].  Reintroduced my role as his navigator and encouraged him to call as he proceeds with treatments and appointments at [facility].  Provided the accompanying Care Plan Summary:                                         Care Plan Summary  Name:  Travis Nguyen DOB:  04/08/1951  Your Medical Team:   Urologist -  Dr. Raynelle Bring, [facility]  Radiation Oncologist - Dr. Arloa Koh, [facility]   Medical Oncologist - Dr. Zola Button, [place]  Recommendations: 1) Prostatectomy * This recommendation is based on information available as of today's consult.      Recommendations may change depending on the results of further tests or exams.  Next Steps: 1) You will be contacted by Dr. Cy Blamer office. When appointments need to be scheduled, you will be contacted by [facility] and/or [facility].  Questions? Please do not hesitate to call Travis Orem, Travis Nguyen, Travis Nguyen, [facility] at [phone] with any questions or concerns.  Travis Nguyen is Counsellor and is available to assist you while you're receiving your medical care at [facility]. ______________________________________________________________________________________________________________________  I encouraged him to call me with any questions or concerns as his treatments progress.  He indicated understanding.  Travis Orem, Travis Nguyen, Travis Nguyen, [facility] Prostate Oncology Navigator [phone]

## 2014-01-04 NOTE — Consult Note (Signed)
Chief Complaint  Prostate Cancer   Reason For Visit  Reason for consult: To discuss treatment options for prostate cancer. Physician requesting consult: Dr. Rana Snare PCP: Dr. [name] Primary oncologist: Dr. Concha Norway   History of Present Illness  Travis Nguyen is a 63 year old gentleman with a history of multiple myeloma under the care of Dr. Santiago Bur and now Dr. Concha Norway.  He has been treated with Cytoxan and is currently on maintenace therapy with Revlimid.  He has been considered for a possible bone marrow transplant at [facility] but has yet to proceed with that treatment.  He presented to Dr. Risa Grill in February 2014 when his PSA was noted to be elevated at 16.68.  He underwent a prostate biopsy in April 2014 which demonstrated clinical stage T1c prostate cancer with 3 out of 12 biopsy cores to be positive for Gleason 3+3=6 adenocarcinoma (with each core < 50% involvement). He initially considered proceeding with therapy of curative intent but eventually proceeded with active surveillance considering his myeloma and to undergo evaluation at [facility] to discuss a possible bone marrow transplant. His PSA has remained stable with his last PSA having been 13.20 in May 2015.  He underwent a surveillance biopsy on 12/24/13 which demonstrated upgraded Gleason 3+4=7 adenocarcinoma with 3 out of 12 biopsy cores positive for malignancy.   He presents today to discuss treatment options.  TNM stage: cT1c Nx Mx PSA: 13.20 Gleason score: 3+4=7 Biopsy (12/24/13): 3/12 cores positive    L apex (20%, 3+4=7), L mid (40%, 3+4=7), L lateral base (5%, 3+3=6) Prostate volume: 22 cc  Nomogram OC disease: 35% EPE: 58% SVI: 5% LNI: 5% PFS (surgery): 57% at 5 years, 51% at 10 years DSS: 99% at 5 and 10 years  Urinary function: He has bothersome urinary frequency, urgency, and nocturia. IPSS is 18. Erectile function: He has erectile dysfunction. SHIM score is 6.    Past Medical  History  1. History of Back Pain  2. History of anemia (V12.3)  3. History of hypertension (V12.59)  4. History of neutropenia (V12.3)  5. History of Leukopenia (288.50)  6. History of Multiple myeloma (203.00)  7. History of Murmur (785.2)  Surgical History  1. History of Cholecystectomy  Current Meds  1. Acyclovir 400 MG Oral Tablet;  Therapy: (Recorded:19Sep2014) to Recorded  2. Aspirin 81 MG Oral Tablet;  Therapy: (Recorded:19Sep2014) to Recorded  3. Cytoxan 500 MG SOLR;  Therapy: (Recorded:19Sep2014) to Recorded  4. Decadron SOLN;  Therapy: (Recorded:19Sep2014) to Recorded  5. Levofloxacin 500 MG Oral Tablet; 1 tablet the day before procedure, 1 tablet day of  procedure, and 1 tablet day after procedure;  Therapy: [id number] to (Last Rx:19May2015)  Requested for: [id number] Ordered  6. Lidocaine-Prilocaine 2.5-2.5 % External Cream;  Therapy: (Recorded:19Sep2014) to Recorded  7. Losartan Potassium 100 MG Oral Tablet;  Therapy: (Recorded:03Feb2014) to Recorded  8. OxyCONTIN 10 MG TB12;  Therapy: (Recorded:19Sep2014) to Recorded  9. Reglan 10 MG Oral Tablet;  Therapy: (Recorded:19Sep2014) to Recorded  10. Revlimid 25 MG Oral Capsule;   Therapy: (Recorded:19Sep2014) to Recorded  11. Velcade SOLR;   Therapy: (Recorded:19Sep2014) to Recorded  Allergies  1. No Known Drug Allergies  Family History  1. Family history of Family Health Status Number Of Children  2. Family history of Hypertension (V17.49) : Mother  3. Family history of Prostate Cancer (Z60.10) : Father  Social History   Denied: History of Alcohol Use   Exercise Habits   Former smoker [place])  Living Independently With Spouse   Marital History - Currently Married   Occupation:  Physical Exam Constitutional: Well nourished and well developed . No acute distress.    Results/Data  I have independently reviewed his pathology slides, PSA results, and medical records today.     Assessment  1.  Prostate cancer (185)  Discussion/Summary  1.  Prostate cancer: After reviewing his situation in detail with [name], his life expectancy appears to be reasonable and exceeds 10 years after reviewing his myeloma situation.  Dr. Hazeline Junker recommendation was to proceed with therapy of curative intent.  After reviewing options for treatment with Dr. Valere Dross and [name] and considering his fairly bothersome lower urinary tract symptoms, it was agreed that surgical treatment would likely be in his best interest.  I reviewed these findings and this discussion with Mr. Ritchie and he is in agreement after discussing his options with Dr. Valere Dross and [name].  He is very well-informed and had tentatively been scheduled for surgery 1 year ago.  He would like to proceed as expediently as possible and currently, his potential bone marrow transplant is on hold.    The patient was counseled about the natural history of prostate cancer and the standard treatment options that are available for prostate cancer. It was explained to him how his age and life expectancy, clinical stage, Gleason score, and PSA affect his prognosis, the decision to proceed with additional staging studies, as well as how that information influences recommended treatment strategies. We discussed the roles for active surveillance, radiation therapy, surgical therapy, androgen deprivation, as well as ablative therapy options for the treatment of prostate cancer as appropriate to his individual cancer situation. We discussed the risks and benefits of these options with regard to their impact on cancer control and also in terms of potential adverse events, complications, and impact on quiality of life particularly related to urinary, bowel, and sexual function. The patient was encouraged to ask questions throughout the discussion today and all questions were answered to his stated satisfaction. In addition, the patient was provided with and/or  directed to appropriate resources and literature for further education about prostate cancer and treatment options.   I will plan to notify Dr. Risa Grill of the recommendations from the multidisciplinary clinic today.  The plan is for him to be scheduled for surgical treatment in the near future.  Cc: Dr. Arloa Koh Dr. Caryl Bis Dr. Rana Snare Dr. [name]  A total of 25 minutes were spent in the overall care of the patient today with 25 minutes in direct face to face consultation.    Signatures Electronically signed by : Raynelle Bring, M.D.; Jan 04 2014  4:19PM EST

## 2014-01-04 NOTE — Telephone Encounter (Signed)
Called patient to see if he had any questions prior to his attendance at tomorrow's Prostate [place], met him him [facility] lobby, showed him [place], explained registration procedure.  Gayleen Orem, RN, BSN, [facility] Prostate Oncology Navigator [phone]     .

## 2014-01-05 ENCOUNTER — Other Ambulatory Visit: Payer: Self-pay | Admitting: Urology

## 2014-01-05 NOTE — Telephone Encounter (Signed)
Revlimid was shipped 01/04/14 for next day delivery.

## 2014-01-21 ENCOUNTER — Encounter: Payer: Self-pay | Admitting: Internal Medicine

## 2014-01-21 ENCOUNTER — Other Ambulatory Visit (HOSPITAL_BASED_OUTPATIENT_CLINIC_OR_DEPARTMENT_OTHER): Payer: No Typology Code available for payment source

## 2014-01-21 ENCOUNTER — Telehealth: Payer: Self-pay | Admitting: Internal Medicine

## 2014-01-21 ENCOUNTER — Ambulatory Visit (HOSPITAL_BASED_OUTPATIENT_CLINIC_OR_DEPARTMENT_OTHER): Payer: No Typology Code available for payment source | Admitting: Internal Medicine

## 2014-01-21 ENCOUNTER — Ambulatory Visit: Payer: No Typology Code available for payment source

## 2014-01-21 VITALS — BP 179/78 | HR 63 | Temp 98.3°F | Resp 18 | Ht 73.5 in | Wt 162.6 lb

## 2014-01-21 DIAGNOSIS — M545 Low back pain, unspecified: Secondary | ICD-10-CM

## 2014-01-21 DIAGNOSIS — I10 Essential (primary) hypertension: Secondary | ICD-10-CM

## 2014-01-21 DIAGNOSIS — C9000 Multiple myeloma not having achieved remission: Secondary | ICD-10-CM

## 2014-01-21 DIAGNOSIS — G8929 Other chronic pain: Secondary | ICD-10-CM

## 2014-01-21 DIAGNOSIS — C61 Malignant neoplasm of prostate: Secondary | ICD-10-CM

## 2014-01-21 DIAGNOSIS — Z95828 Presence of other vascular implants and grafts: Secondary | ICD-10-CM

## 2014-01-21 LAB — CBC WITH DIFFERENTIAL/PLATELET
BASO%: 0.7 % (ref 0.0–2.0)
BASOS ABS: 0 10*3/uL (ref 0.0–0.1)
EOS%: 1.1 % (ref 0.0–7.0)
Eosinophils Absolute: 0 10*3/uL (ref 0.0–0.5)
HCT: 38.4 % (ref 38.4–49.9)
HEMOGLOBIN: 12.7 g/dL — AB (ref 13.0–17.1)
LYMPH%: 31.8 % (ref 14.0–49.0)
MCH: 31.2 pg (ref 27.2–33.4)
MCHC: 33 g/dL (ref 32.0–36.0)
MCV: 94.5 fL (ref 79.3–98.0)
MONO#: 0.4 10*3/uL (ref 0.1–0.9)
MONO%: 14.5 % — AB (ref 0.0–14.0)
NEUT%: 51.9 % (ref 39.0–75.0)
NEUTROS ABS: 1.4 10*3/uL — AB (ref 1.5–6.5)
Platelets: 165 10*3/uL (ref 140–400)
RBC: 4.07 10*6/uL — ABNORMAL LOW (ref 4.20–5.82)
RDW: 14.5 % (ref 11.0–14.6)
WBC: 2.7 10*3/uL — ABNORMAL LOW (ref 4.0–10.3)
lymph#: 0.9 10*3/uL (ref 0.9–3.3)

## 2014-01-21 LAB — COMPREHENSIVE METABOLIC PANEL (CC13)
ALBUMIN: 3.8 g/dL (ref 3.5–5.0)
ALK PHOS: 82 U/L (ref 40–150)
ALT: 22 U/L (ref 0–55)
AST: 21 U/L (ref 5–34)
Anion Gap: 11 mEq/L (ref 3–11)
BUN: 14.9 mg/dL (ref 7.0–26.0)
CO2: 24 mEq/L (ref 22–29)
Calcium: 9.1 mg/dL (ref 8.4–10.4)
Chloride: 105 mEq/L (ref 98–109)
Creatinine: 0.9 mg/dL (ref 0.7–1.3)
Glucose: 105 mg/dl (ref 70–140)
POTASSIUM: 4.2 meq/L (ref 3.5–5.1)
Sodium: 140 mEq/L (ref 136–145)
Total Bilirubin: 0.45 mg/dL (ref 0.20–1.20)
Total Protein: 6.8 g/dL (ref 6.4–8.3)

## 2014-01-21 LAB — LACTATE DEHYDROGENASE (CC13): LDH: 207 U/L (ref 125–245)

## 2014-01-21 MED ORDER — HEPARIN SOD (PORK) LOCK FLUSH 100 UNIT/ML IV SOLN
500.0000 [IU] | Freq: Once | INTRAVENOUS | Status: AC
  Administered 2014-01-21: 500 [IU] via INTRAVENOUS
  Filled 2014-01-21: qty 5

## 2014-01-21 MED ORDER — SODIUM CHLORIDE 0.9 % IJ SOLN
10.0000 mL | INTRAMUSCULAR | Status: DC | PRN
  Administered 2014-01-21: 10 mL via INTRAVENOUS
  Filled 2014-01-21: qty 10

## 2014-01-21 NOTE — Telephone Encounter (Signed)
gv and printed appt sched and avs for pt for Aug °
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
## 2014-01-24 LAB — KAPPA/LAMBDA LIGHT CHAINS
KAPPA LAMBDA RATIO: 1.26 (ref 0.26–1.65)
Kappa free light chain: 1.27 mg/dL (ref 0.33–1.94)
Lambda Free Lght Chn: 1.01 mg/dL (ref 0.57–2.63)

## 2014-01-24 LAB — IGG, IGA, IGM
IGA: 97 mg/dL (ref 68–379)
IGG (IMMUNOGLOBIN G), SERUM: 870 mg/dL (ref 650–1600)
IGM, SERUM: 62 mg/dL (ref 41–251)

## 2014-01-28 ENCOUNTER — Other Ambulatory Visit: Payer: Self-pay | Admitting: *Deleted

## 2014-01-28 ENCOUNTER — Telehealth: Payer: Self-pay | Admitting: *Deleted

## 2014-01-28 DIAGNOSIS — C61 Malignant neoplasm of prostate: Secondary | ICD-10-CM

## 2014-01-28 DIAGNOSIS — C9000 Multiple myeloma not having achieved remission: Secondary | ICD-10-CM

## 2014-01-28 MED ORDER — LENALIDOMIDE 10 MG PO CAPS: ORAL_CAPSULE | ORAL | Status: DC

## 2014-01-28 NOTE — Telephone Encounter (Signed)
Attempted to call pt on home phone, cell phone, and wife's cell phone several times today unsuccsessfully.  Unable to leave messages on pt's cell phone nor wife's cell phone.  Home phone voice mail with different person's name.   Will ask triage nurse to contact pt on Mon. 01/31/14  To have pt take survey for Revlimid refill.  Prescription already faxed to [facility] today 01/28/14.

## 2014-01-31 ENCOUNTER — Telehealth: Payer: Self-pay | Admitting: *Deleted

## 2014-01-31 NOTE — Progress Notes (Signed)
PST NOTE--- Have made multiple attempts to call patient to set up pre op- 01/20/14 note 2 incorrect numbers, cell [phone] unavailable, 7/14,7/15 unavailable. Left message with Selita at [facility] for possible alternate number.  She gave [name] wife cell [phone]- have attempted to call 7/16, 7/17, 7/20- states unavailable.  Left message with Foster Simpson at [facility] that we can not contact this patient

## 2014-01-31 NOTE — Telephone Encounter (Signed)
UNABLE TO CONTACT PT. OR HIS WIFE.

## 2014-02-03 NOTE — Patient Instructions (Signed)
Travis Nguyen  02/03/2014   Your procedure is scheduled on:  02/09/2014  0830am-1200noon  Report to [facility].  Follow the Signs to [facility] at 0630       am  Call this number if you have problems the morning of surgery: [phone]   Remember:   Do not eat food or drink liquids after midnight.   Take these medicines the morning of surgery with A SIP OF WATER:    Do not wear jewelry,   Do not wear lotions, powders, or perfumes. , deodorant    Men may shave face and neck.  Do not bring valuables to the hospital.  Contacts, dentures or bridgework may not be worn into surgery.  Leave suitcase in the car. After surgery it may be brought to your room.  For patients admitted to the hospital, checkout time is 11:00 AM the day of  discharge.      Please read over the following fact sheets that you were given: [facility] - Preparing for Surgery Before surgery, you can play an important role.  Because skin is not sterile, your skin needs to be as free of germs as possible.  You can reduce the number of germs on your skin by washing with CHG (chlorahexidine gluconate) soap before surgery.  CHG is an antiseptic cleaner which kills germs and bonds with the skin to continue killing germs even after washing. Please DO NOT use if you have an allergy to CHG or antibacterial soaps.  If your skin becomes reddened/irritated stop using the CHG and inform your nurse when you arrive at Short Stay. Do not shave (including legs and underarms) for at least 48 hours prior to the first CHG shower.  You may shave your face/neck. Please follow these instructions carefully:  1.  Shower with CHG Soap the night before surgery and the  morning of Surgery.  2.  If you choose to wash your hair, wash your hair first as usual with your  normal  shampoo.  3.  After you shampoo, rinse your hair and body thoroughly to remove the  shampoo.                           4.  Use CHG as you would any other  liquid soap.  You can apply chg directly  to the skin and wash                       Gently with a scrungie or clean washcloth.  5.  Apply the CHG Soap to your body ONLY FROM THE NECK DOWN.   Do not use on face/ open                           Wound or open sores. Avoid contact with eyes, ears mouth and genitals (private parts).                       Wash face,  Genitals (private parts) with your normal soap.             6.  Wash thoroughly, paying special attention to the area where your surgery  will be performed.  7.  Thoroughly rinse your body with warm water from the neck down.  8.  DO NOT shower/wash with your normal soap after using and rinsing off  the  CHG Soap.                9.  Pat yourself dry with a clean towel.            10.  Wear clean pajamas.            11.  Place clean sheets on your bed the night of your first shower and do not  sleep with pets. Day of Surgery : Do not apply any lotions/deodorants the morning of surgery.  Please wear clean clothes to the hospital/surgery center.  FAILURE TO FOLLOW THESE INSTRUCTIONS MAY RESULT IN THE CANCELLATION OF YOUR SURGERY PATIENT SIGNATURE_________________________________  NURSE SIGNATURE__________________________________  ________________________________________________________________________  WHAT IS A BLOOD TRANSFUSION? Blood Transfusion Information  A transfusion is the replacement of blood or some of its parts. Blood is made up of multiple cells which provide different functions.  Red blood cells carry oxygen and are used for blood loss replacement.  White blood cells fight against infection.  Platelets control bleeding.  Plasma helps clot blood.  Other blood products are available for specialized needs, such as hemophilia or other clotting disorders. BEFORE THE TRANSFUSION  Who gives blood for transfusions?   Healthy volunteers who are fully evaluated to make sure their blood is safe. This is blood bank  blood. Transfusion therapy is the safest it has ever been in the practice of medicine. Before blood is taken from a donor, a complete history is taken to make sure that person has no history of diseases nor engages in risky social behavior (examples are intravenous drug use or sexual activity with multiple partners). The donor's travel history is screened to minimize risk of transmitting infections, such as malaria. The donated blood is tested for signs of infectious diseases, such as HIV and hepatitis. The blood is then tested to be sure it is compatible with you in order to minimize the chance of a transfusion reaction. If you or a relative donates blood, this is often done in anticipation of surgery and is not appropriate for emergency situations. It takes many days to process the donated blood. RISKS AND COMPLICATIONS Although transfusion therapy is very safe and saves many lives, the main dangers of transfusion include:   Getting an infectious disease.  Developing a transfusion reaction. This is an allergic reaction to something in the blood you were given. Every precaution is taken to prevent this. The decision to have a blood transfusion has been considered carefully by your caregiver before blood is given. Blood is not given unless the benefits outweigh the risks. AFTER THE TRANSFUSION  Right after receiving a blood transfusion, you will usually feel much better and more energetic. This is especially true if your red blood cells have gotten low (anemic). The transfusion raises the level of the red blood cells which carry oxygen, and this usually causes an energy increase.  The nurse administering the transfusion will monitor you carefully for complications. HOME CARE INSTRUCTIONS  No special instructions are needed after a transfusion. You may find your energy is better. Speak with your caregiver about any limitations on activity for underlying diseases you may have. SEEK MEDICAL CARE IF:    Your condition is not improving after your transfusion.  You develop redness or irritation at the intravenous (IV) site. SEEK IMMEDIATE MEDICAL CARE IF:  Any of the following symptoms occur over the next 12 hours:  Shaking chills.  You have a temperature by mouth above 102 F (38.9 C), not controlled by medicine.  Chest, back, or muscle pain.  People around you feel you are not acting correctly or are confused.  Shortness of breath or difficulty breathing.  Dizziness and fainting.  You get a rash or develop hives.  You have a decrease in urine output.  Your urine turns a dark color or changes to pink, red, or brown. Any of the following symptoms occur over the next 10 days:  You have a temperature by mouth above 102 F (38.9 C), not controlled by medicine.  Shortness of breath.  Weakness after normal activity.  The white part of the eye turns yellow (jaundice).  You have a decrease in the amount of urine or are urinating less often.  Your urine turns a dark color or changes to pink, red, or brown. Document Released: 06/28/2000 Document Revised: 09/23/2011 Document Reviewed: 02/15/2008 ExitCare Patient Information 2014 [place].  _______________________________________________________________________ coughing and deep breathing exercises, leg exercises

## 2014-02-04 ENCOUNTER — Telehealth: Payer: Self-pay

## 2014-02-04 ENCOUNTER — Encounter (HOSPITAL_COMMUNITY): Payer: Self-pay | Admitting: Pharmacy Technician

## 2014-02-04 NOTE — Telephone Encounter (Signed)
Sunday Spillers from [employer] called and stated pt's wife has pharmacy benefits. Therefore [employer] needs to do a prior authorization. Sunday Spillers asked for latest office note, labs and any tried and failed therapies. These were faxed to [employer].

## 2014-02-04 NOTE — Telephone Encounter (Signed)
RECEIVED A FAX FROM [employer] CONCERNING A NOTICE OF RX TRANSFER FOR REVLIMID TO [facility].

## 2014-02-04 NOTE — Telephone Encounter (Signed)
Travis Nguyen from [employer] called stating revlimid was approved but [facility] was not able to dispense. She is forwarding pt's information to [place].

## 2014-02-06 ENCOUNTER — Encounter (HOSPITAL_COMMUNITY): Payer: Self-pay | Admitting: Emergency Medicine

## 2014-02-06 ENCOUNTER — Emergency Department (HOSPITAL_COMMUNITY)
Admission: EM | Admit: 2014-02-06 | Discharge: 2014-02-06 | Disposition: A | Discharge status: Home / Self Care | Payer: No Typology Code available for payment source | Attending: Emergency Medicine | Admitting: Emergency Medicine

## 2014-02-06 DIAGNOSIS — Z85820 Personal history of malignant melanoma of skin: Secondary | ICD-10-CM | POA: Insufficient documentation

## 2014-02-06 DIAGNOSIS — I10 Essential (primary) hypertension: Secondary | ICD-10-CM | POA: Insufficient documentation

## 2014-02-06 DIAGNOSIS — Z8546 Personal history of malignant neoplasm of prostate: Secondary | ICD-10-CM | POA: Insufficient documentation

## 2014-02-06 DIAGNOSIS — M129 Arthropathy, unspecified: Secondary | ICD-10-CM | POA: Diagnosis not present

## 2014-02-06 DIAGNOSIS — Z87898 Personal history of other specified conditions: Secondary | ICD-10-CM | POA: Diagnosis not present

## 2014-02-06 DIAGNOSIS — R011 Cardiac murmur, unspecified: Secondary | ICD-10-CM | POA: Insufficient documentation

## 2014-02-06 DIAGNOSIS — Z79899 Other long term (current) drug therapy: Secondary | ICD-10-CM | POA: Insufficient documentation

## 2014-02-06 DIAGNOSIS — I158 Other secondary hypertension: Secondary | ICD-10-CM | POA: Diagnosis not present

## 2014-02-06 DIAGNOSIS — Z8709 Personal history of other diseases of the respiratory system: Secondary | ICD-10-CM | POA: Diagnosis not present

## 2014-02-06 DIAGNOSIS — Z87891 Personal history of nicotine dependence: Secondary | ICD-10-CM | POA: Insufficient documentation

## 2014-02-06 DIAGNOSIS — Z7982 Long term (current) use of aspirin: Secondary | ICD-10-CM | POA: Diagnosis not present

## 2014-02-06 DIAGNOSIS — Z8701 Personal history of pneumonia (recurrent): Secondary | ICD-10-CM | POA: Insufficient documentation

## 2014-02-06 DIAGNOSIS — Z8719 Personal history of other diseases of the digestive system: Secondary | ICD-10-CM | POA: Insufficient documentation

## 2014-02-06 DIAGNOSIS — I159 Secondary hypertension, unspecified: Secondary | ICD-10-CM

## 2014-02-06 NOTE — ED Provider Notes (Signed)
Medical screening examination/treatment/procedure(s) were performed by non-physician practitioner and as supervising physician I was immediately available for consultation/collaboration.   EKG Interpretation None       Ezequiel Essex, MD 02/06/14 1728

## 2014-02-06 NOTE — ED Notes (Signed)
Pt outside of the room ready for d/c. Asked to wait in room for d/c paperwork.

## 2014-02-06 NOTE — ED Notes (Signed)
Pt reports having high bp despite taking his meds. Came here today just to have it checked bc he is due for surgery on wed. bp is 171/86 at triage.

## 2014-02-06 NOTE — ED Notes (Signed)
PA at bedside.

## 2014-02-06 NOTE — Discharge Instructions (Signed)
Please contact your primary care physician to discuss your hypertension regimen. Please continue to take your home blood pressure medications as prescribed. Please read all discharge instructions and return precautions.    Hypertension Hypertension, commonly called high blood pressure, is when the force of blood pumping through your arteries is too strong. Your arteries are the blood vessels that carry blood from your heart throughout your body. A blood pressure reading consists of a higher number over a lower number, such as 110/72. The higher number (systolic) is the pressure inside your arteries when your heart pumps. The lower number (diastolic) is the pressure inside your arteries when your heart relaxes. Ideally you want your blood pressure below 120/80. Hypertension forces your heart to work harder to pump blood. Your arteries may become narrow or stiff. Having hypertension puts you at risk for heart disease, stroke, and other problems.  RISK FACTORS Some risk factors for high blood pressure are controllable. Others are not.  Risk factors you cannot control include:   Race. You may be at higher risk if you are African American.  Age. Risk increases with age.  Gender. Men are at higher risk than women before age 80 years. After age 53, women are at higher risk than men. Risk factors you can control include:  Not getting enough exercise or physical activity.  Being overweight.  Getting too much fat, sugar, calories, or salt in your diet.  Drinking too much alcohol. SIGNS AND SYMPTOMS Hypertension does not usually cause signs or symptoms. Extremely high blood pressure (hypertensive crisis) may cause headache, anxiety, shortness of breath, and nosebleed. DIAGNOSIS  To check if you have hypertension, your health care provider will measure your blood pressure while you are seated, with your arm held at the level of your heart. It should be measured at least twice using the same arm.  Certain conditions can cause a difference in blood pressure between your right and left arms. A blood pressure reading that is higher than normal on one occasion does not mean that you need treatment. If one blood pressure reading is high, ask your health care provider about having it checked again. TREATMENT  Treating high blood pressure includes making lifestyle changes and possibly taking medicine. Living a healthy lifestyle can help lower high blood pressure. You may need to change some of your habits. Lifestyle changes may include:  Following the DASH diet. This diet is high in fruits, vegetables, and whole grains. It is low in salt, red meat, and added sugars.  Getting at least 2 hours of brisk physical activity every week.  Losing weight if necessary.  Not smoking.  Limiting alcoholic beverages.  Learning ways to reduce stress. If lifestyle changes are not enough to get your blood pressure under control, your health care provider may prescribe medicine. You may need to take more than one. Work closely with your health care provider to understand the risks and benefits. HOME CARE INSTRUCTIONS  Have your blood pressure rechecked as directed by your health care provider.   Take medicines only as directed by your health care provider. Follow the directions carefully. Blood pressure medicines must be taken as prescribed. The medicine does not work as well when you skip doses. Skipping doses also puts you at risk for problems.   Do not smoke.   Monitor your blood pressure at home as directed by your health care provider. SEEK MEDICAL CARE IF:   You think you are having a reaction to medicines taken.  You  have recurrent headaches or feel dizzy.  You have swelling in your ankles.  You have trouble with your vision. SEEK IMMEDIATE MEDICAL CARE IF:  You develop a severe headache or confusion.  You have unusual weakness, numbness, or feel faint.  You have severe chest or  abdominal pain.  You vomit repeatedly.  You have trouble breathing. MAKE SURE YOU:   Understand these instructions.  Will watch your condition.  Will get help right away if you are not doing well or get worse. Document Released: 07/01/2005 Document Revised: 11/15/2013 Document Reviewed: 04/23/2013 [facility] Patient Information 2015 [place], [place]. This information is not intended to replace advice given to you by your health care provider. Make sure you discuss any questions you have with your health care provider.
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
## 2014-02-07 ENCOUNTER — Encounter (HOSPITAL_COMMUNITY): Payer: Self-pay

## 2014-02-07 ENCOUNTER — Encounter (HOSPITAL_COMMUNITY)
Admission: RE | Admit: 2014-02-07 | Discharge: 2014-02-07 | Disposition: A | Discharge status: Home / Self Care | Payer: No Typology Code available for payment source | Source: Ambulatory Visit | Attending: Urology | Admitting: Urology

## 2014-02-07 ENCOUNTER — Encounter: Payer: Self-pay | Admitting: Internal Medicine

## 2014-02-07 LAB — BASIC METABOLIC PANEL
Anion gap: 13 (ref 5–15)
BUN: 20 mg/dL (ref 6–23)
CALCIUM: 9.7 mg/dL (ref 8.4–10.5)
CO2: 24 meq/L (ref 19–32)
CREATININE: 0.89 mg/dL (ref 0.50–1.35)
Chloride: 101 mEq/L (ref 96–112)
GFR calc Af Amer: >90 mL/min (ref >90)
GFR calc non Af Amer: >90 mL/min (ref >90)
GLUCOSE: 87 mg/dL (ref 70–99)
Potassium: 4.7 mEq/L (ref 3.7–5.3)
Sodium: 138 mEq/L (ref 137–147)

## 2014-02-07 LAB — CBC
HCT: 39.5 % (ref 39.0–52.0)
HEMOGLOBIN: 13.4 g/dL (ref 13.0–17.0)
MCH: 30.8 pg (ref 26.0–34.0)
MCHC: 33.9 g/dL (ref 30.0–36.0)
MCV: 90.8 fL (ref 78.0–100.0)
PLATELETS: 192 10*3/uL (ref 150–400)
RBC: 4.35 MIL/uL (ref 4.22–5.81)
RDW: 13.2 % (ref 11.5–15.5)
WBC: 3.5 10*3/uL — ABNORMAL LOW (ref 4.0–10.5)

## 2014-02-07 NOTE — Progress Notes (Signed)
CXR - 05/2013 in [facility]  EKG- 03/2013 in [facility]

## 2014-02-07 NOTE — Progress Notes (Signed)
[facility] transferred patient's revlimid prescription to [facility] and they state we need to fax it to [facility] @ [phone], which I did.

## 2014-02-08 NOTE — H&P (Signed)
History of Present Illness   Mr Earll presents today along with his wife to further discuss his upcoming robotic surgery. His situation has been fairly complicated. He was in recently to the multidisciplinary conference. We felt it would be prudent to get everyone's input about the status of his myeloma along with his prostate cancer, to determine the best sequence of treatment. He did have evidence on his most recent biopsy of pathology progression and was showing evidence of Gleason 7 cancer. Fortunately, it continued to be relatively low volume. It was a consensus of the group that he go ahead and proceed with active treatment for his prostate cancer. It was their feeling that a surgical intervention made the most sense and that is how Mr Vizcarrondo chose to proceed. We therefore arranged for him to have surgery for later in July. He presents to further discuss the situation. He has had many discussions about surgical treatment and intervention. At one point he was on the surgical schedule about a year ago, but that got postponed when the oncologist at that time was more concerned about his myeloma. Otherwise, there has been no significant clinical change. Potential bone marrow transplant on hold currently with regard to his myeloma.     Past Medical History Problems  1. History of Back Pain 2. History of anemia (V12.3) 3. History of hypertension (V12.59) 4. History of neutropenia (V12.3) 5. History of Leukopenia (288.50) 6. History of Multiple myeloma (203.00) 7. History of Murmur (785.2)  Surgical History Problems  1. History of Cholecystectomy  Current Meds 1. Acyclovir 400 MG Oral Tablet;  Therapy: (Recorded:19Sep2014) to Recorded 2. Aspirin 81 MG Oral Tablet;  Therapy: (Recorded:19Sep2014) to Recorded 3. Cytoxan 500 MG SOLR;  Therapy: (Recorded:19Sep2014) to Recorded 4. Decadron SOLN;  Therapy: (Recorded:19Sep2014) to Recorded 5. Lidocaine-Prilocaine 2.5-2.5 % External Cream;  Therapy: (Recorded:19Sep2014) to Recorded 6. Losartan Potassium 100 MG Oral Tablet;  Therapy: (Recorded:03Feb2014) to Recorded 7. OxyCONTIN 10 MG TB12;  Therapy: (Recorded:19Sep2014) to Recorded 8. Reglan 10 MG Oral Tablet;  Therapy: (Recorded:19Sep2014) to Recorded 9. Revlimid 25 MG Oral Capsule;  Therapy: (Recorded:19Sep2014) to Recorded 10. Velcade SOLR;   Therapy: (Recorded:19Sep2014) to Recorded  Allergies Medication  1. No Known Drug Allergies  Family History Problems  1. Family history of Family Health Status Number Of Children   1 son 2. Family history of Hypertension (V17.49) : Mother 3. Family history of Prostate Cancer (T62.26) : Father  Social History Problems  1. Denied: History of Alcohol Use 2. Exercise Habits   Walking 30 min/day 3. Former smoker (V15.82)   2ppd for 55yr, quit in his mid 20's 4. Living Independently With Spouse   Requires help most of the time to don socks/shoes due to back pain. 5. Marital History - Currently Married 6. Occupation:   fEngineer, maintenance (IT) Review of Systems Genitourinary, constitutional, skin, eye, otolaryngeal, hematologic/lymphatic, cardiovascular, pulmonary, endocrine, musculoskeletal, gastrointestinal, neurological and psychiatric system(s) were reviewed and pertinent findings if present are noted.  Genitourinary: urinary frequency and nocturia, but urine stream is not weak.  Musculoskeletal: back pain.   Physical Exam Constitutional: Well nourished and well developed . No acute distress.  ENT:. The ears and nose are normal in appearance.  Neck: The appearance of the neck is normal and no neck mass is present.  Pulmonary: No respiratory distress and normal respiratory rhythm and effort.  Cardiovascular: Heart rate and rhythm are normal . No peripheral edema.  Abdomen: The abdomen is soft and nontender. No masses are palpated. No  CVA tenderness. No hernias are palpable. No hepatosplenomegaly noted.  Rectal: Rectal exam  demonstrates normal sphincter tone, no tenderness and no masses. Estimated prostate size is 1+. Normal rectal tone, no rectal masses, prostate is smooth, symmetric and non-tender. The prostate has no nodularity and is tender. The left seminal vesicle is nonpalpable. The right seminal vesicle is nonpalpable. The perineum is normal on inspection.  Genitourinary: Examination of the penis demonstrates no discharge, no masses, no lesions and a normal meatus. The scrotum is without lesions. The right epididymis is palpably normal and non-tender. The left epididymis is palpably normal and non-tender. The right testis is non-tender and without masses. The left testis is non-tender and without masses.  Lymphatics: The femoral and inguinal nodes are not enlarged or tender.  Skin: Normal skin turgor, no visible rash and no visible skin lesions.  Neuro/Psych:. Mood and affect are appropriate.    Assessment Assessed  1. Prostate cancer (185)  Plan Prostate cancer  1. Follow-up Keep Future Appt Office  Follow-up  Status: Hold For - Appointment  Requested  for: 30Jun2015  Discussion/Summary   Mr Dery currently has intermediate risk prostate cancer. He is scheduled in 3-4 weeks for robotic prostatectomy. We will also plan pelvic lymph node dissection. We have answered additional questions and again, gone over how the surgery is done and what to expect typically. I have encouraged him to attend the prostate cancer robotic surgery class that is held at [facility]. He and wife will make an attempt to make it to that informational session. Otherwise, we will plan on seeing him on July 29 for his robotic surgery.

## 2014-02-09 ENCOUNTER — Encounter (HOSPITAL_COMMUNITY): Payer: Self-pay | Admitting: *Deleted

## 2014-02-09 ENCOUNTER — Encounter (HOSPITAL_COMMUNITY): Payer: No Typology Code available for payment source | Admitting: Anesthesiology

## 2014-02-09 ENCOUNTER — Inpatient Hospital Stay (HOSPITAL_COMMUNITY): Payer: No Typology Code available for payment source | Admitting: Anesthesiology

## 2014-02-09 ENCOUNTER — Inpatient Hospital Stay (HOSPITAL_COMMUNITY)
Admission: RE | Admit: 2014-02-09 | Discharge: 2014-02-10 | DRG: 707 | Disposition: A | Discharge status: Home / Self Care | Payer: No Typology Code available for payment source | Source: Ambulatory Visit | Attending: Urology | Admitting: Urology

## 2014-02-09 ENCOUNTER — Encounter (HOSPITAL_COMMUNITY)
Admission: RE | Disposition: A | Discharge status: Home / Self Care | Payer: Self-pay | Source: Ambulatory Visit | Attending: Urology

## 2014-02-09 DIAGNOSIS — Z7982 Long term (current) use of aspirin: Secondary | ICD-10-CM | POA: Diagnosis not present

## 2014-02-09 DIAGNOSIS — Z8042 Family history of malignant neoplasm of prostate: Secondary | ICD-10-CM

## 2014-02-09 DIAGNOSIS — C61 Malignant neoplasm of prostate: Principal | ICD-10-CM | POA: Diagnosis present

## 2014-02-09 DIAGNOSIS — Z8249 Family history of ischemic heart disease and other diseases of the circulatory system: Secondary | ICD-10-CM

## 2014-02-09 DIAGNOSIS — M549 Dorsalgia, unspecified: Secondary | ICD-10-CM | POA: Diagnosis present

## 2014-02-09 DIAGNOSIS — Z9089 Acquired absence of other organs: Secondary | ICD-10-CM

## 2014-02-09 DIAGNOSIS — Z79899 Other long term (current) drug therapy: Secondary | ICD-10-CM

## 2014-02-09 DIAGNOSIS — Z87891 Personal history of nicotine dependence: Secondary | ICD-10-CM

## 2014-02-09 DIAGNOSIS — R011 Cardiac murmur, unspecified: Secondary | ICD-10-CM | POA: Diagnosis present

## 2014-02-09 DIAGNOSIS — I10 Essential (primary) hypertension: Secondary | ICD-10-CM | POA: Diagnosis present

## 2014-02-09 DIAGNOSIS — C9000 Multiple myeloma not having achieved remission: Secondary | ICD-10-CM | POA: Diagnosis present

## 2014-02-09 DIAGNOSIS — Z01812 Encounter for preprocedural laboratory examination: Secondary | ICD-10-CM

## 2014-02-09 HISTORY — PX: LYMPHADENECTOMY: SHX5960

## 2014-02-09 HISTORY — PX: ROBOT ASSISTED LAPAROSCOPIC RADICAL PROSTATECTOMY: SHX5141

## 2014-02-09 LAB — BASIC METABOLIC PANEL
Anion gap: 16 — ABNORMAL HIGH (ref 5–15)
BUN: 19 mg/dL (ref 6–23)
CALCIUM: 8.8 mg/dL (ref 8.4–10.5)
CO2: 21 mEq/L (ref 19–32)
CREATININE: 0.95 mg/dL (ref 0.50–1.35)
Chloride: 102 mEq/L (ref 96–112)
GFR calc non Af Amer: 88 mL/min — ABNORMAL LOW (ref >90)
Glucose, Bld: 116 mg/dL — ABNORMAL HIGH (ref 70–99)
Potassium: 4.4 mEq/L (ref 3.7–5.3)
Sodium: 139 mEq/L (ref 137–147)

## 2014-02-09 LAB — TYPE AND SCREEN
ABO/RH(D): B NEG
ANTIBODY SCREEN: NEGATIVE

## 2014-02-09 LAB — HEMOGLOBIN AND HEMATOCRIT, BLOOD
HCT: 35.6 % — ABNORMAL LOW (ref 39.0–52.0)
Hemoglobin: 12.1 g/dL — ABNORMAL LOW (ref 13.0–17.0)

## 2014-02-09 SURGERY — ROBOTIC ASSISTED LAPAROSCOPIC RADICAL PROSTATECTOMY
Anesthesia: General

## 2014-02-09 MED ORDER — KETOROLAC TROMETHAMINE 15 MG/ML IJ SOLN
INTRAMUSCULAR | Status: AC
  Filled 2014-02-09: qty 1

## 2014-02-09 MED ORDER — HYDROMORPHONE HCL PF 2 MG/ML IJ SOLN
INTRAMUSCULAR | Status: AC
  Filled 2014-02-09: qty 1

## 2014-02-09 MED ORDER — LIDOCAINE HCL (CARDIAC) 20 MG/ML IV SOLN
INTRAVENOUS | Status: DC | PRN
  Administered 2014-02-09: 60 mg via INTRAVENOUS

## 2014-02-09 MED ORDER — NEOSTIGMINE METHYLSULFATE 10 MG/10ML IV SOLN
INTRAVENOUS | Status: AC
  Filled 2014-02-09: qty 1

## 2014-02-09 MED ORDER — HYDROMORPHONE HCL PF 1 MG/ML IJ SOLN: 0.5000 mg | INTRAMUSCULAR | Status: DC | PRN

## 2014-02-09 MED ORDER — MEPERIDINE HCL 50 MG/ML IJ SOLN: 6.2500 mg | INTRAMUSCULAR | Status: DC | PRN

## 2014-02-09 MED ORDER — EPHEDRINE SULFATE 50 MG/ML IJ SOLN
INTRAMUSCULAR | Status: DC | PRN
  Administered 2014-02-09 (×2): 5 mg via INTRAVENOUS

## 2014-02-09 MED ORDER — GLYCOPYRROLATE 0.2 MG/ML IJ SOLN
INTRAMUSCULAR | Status: AC
  Filled 2014-02-09: qty 2

## 2014-02-09 MED ORDER — CEFAZOLIN SODIUM-DEXTROSE 2-3 GM-% IV SOLR
INTRAVENOUS | Status: AC
  Filled 2014-02-09: qty 50

## 2014-02-09 MED ORDER — HYDROMORPHONE HCL PF 1 MG/ML IJ SOLN
INTRAMUSCULAR | Status: DC | PRN
  Administered 2014-02-09: 0.5 mg via INTRAVENOUS
  Administered 2014-02-09: 1 mg via INTRAVENOUS
  Administered 2014-02-09: 0.5 mg via INTRAVENOUS

## 2014-02-09 MED ORDER — KETOROLAC TROMETHAMINE 15 MG/ML IJ SOLN
15.0000 mg | Freq: Four times a day (QID) | INTRAMUSCULAR | Status: DC
  Administered 2014-02-09 – 2014-02-10 (×4): 15 mg via INTRAVENOUS
  Filled 2014-02-09 (×5): qty 1

## 2014-02-09 MED ORDER — OXYCODONE HCL 30 MG PO TABS: 30.0000 mg | ORAL_TABLET | ORAL | Status: DC | PRN

## 2014-02-09 MED ORDER — DEXTROSE IN LACTATED RINGERS 5 % IV SOLN
INTRAVENOUS | Status: DC
  Administered 2014-02-09 – 2014-02-10 (×4): via INTRAVENOUS

## 2014-02-09 MED ORDER — CEFAZOLIN SODIUM 1-5 GM-% IV SOLN
1.0000 g | Freq: Three times a day (TID) | INTRAVENOUS | Status: AC
  Administered 2014-02-09 – 2014-02-10 (×2): 1 g via INTRAVENOUS
  Filled 2014-02-09 (×2): qty 50

## 2014-02-09 MED ORDER — HYDROMORPHONE HCL PF 1 MG/ML IJ SOLN
INTRAMUSCULAR | Status: AC
  Filled 2014-02-09: qty 1

## 2014-02-09 MED ORDER — DIPHENHYDRAMINE HCL 50 MG/ML IJ SOLN: 12.5000 mg | Freq: Four times a day (QID) | INTRAMUSCULAR | Status: DC | PRN

## 2014-02-09 MED ORDER — DIPHENHYDRAMINE HCL 12.5 MG/5ML PO ELIX: 12.5000 mg | ORAL_SOLUTION | Freq: Four times a day (QID) | ORAL | Status: DC | PRN

## 2014-02-09 MED ORDER — EPHEDRINE SULFATE 50 MG/ML IJ SOLN
INTRAMUSCULAR | Status: AC
  Filled 2014-02-09: qty 1

## 2014-02-09 MED ORDER — SENNOSIDES-DOCUSATE SODIUM 8.6-50 MG PO TABS
2.0000 | ORAL_TABLET | Freq: Every day | ORAL | Status: DC
  Administered 2014-02-09: 2 via ORAL
  Filled 2014-02-09 (×3): qty 2

## 2014-02-09 MED ORDER — ONDANSETRON HCL 4 MG/2ML IJ SOLN
INTRAMUSCULAR | Status: DC | PRN
  Administered 2014-02-09: 4 mg via INTRAVENOUS

## 2014-02-09 MED ORDER — FENTANYL CITRATE 0.05 MG/ML IJ SOLN
INTRAMUSCULAR | Status: DC | PRN
  Administered 2014-02-09: 100 ug via INTRAVENOUS
  Administered 2014-02-09: 50 ug via INTRAVENOUS
  Administered 2014-02-09: 100 ug via INTRAVENOUS

## 2014-02-09 MED ORDER — MIDAZOLAM HCL 5 MG/5ML IJ SOLN
INTRAMUSCULAR | Status: DC | PRN
  Administered 2014-02-09: 2 mg via INTRAVENOUS

## 2014-02-09 MED ORDER — ROCURONIUM BROMIDE 100 MG/10ML IV SOLN
INTRAVENOUS | Status: DC | PRN
  Administered 2014-02-09: 10 mg via INTRAVENOUS
  Administered 2014-02-09: 50 mg via INTRAVENOUS
  Administered 2014-02-09: 5 mg via INTRAVENOUS
  Administered 2014-02-09: 10 mg via INTRAVENOUS

## 2014-02-09 MED ORDER — STERILE WATER FOR IRRIGATION IR SOLN
Status: DC | PRN
  Administered 2014-02-09: 3000 mL

## 2014-02-09 MED ORDER — BUPIVACAINE-EPINEPHRINE 0.25% -1:200000 IJ SOLN
INTRAMUSCULAR | Status: DC | PRN
  Administered 2014-02-09: 27 mL

## 2014-02-09 MED ORDER — LACTATED RINGERS IV SOLN
INTRAVENOUS | Status: DC | PRN
  Administered 2014-02-09: 10:00:00

## 2014-02-09 MED ORDER — DEXAMETHASONE SODIUM PHOSPHATE 10 MG/ML IJ SOLN
INTRAMUSCULAR | Status: AC
  Filled 2014-02-09: qty 1

## 2014-02-09 MED ORDER — NEOSTIGMINE METHYLSULFATE 10 MG/10ML IV SOLN
INTRAVENOUS | Status: DC | PRN
  Administered 2014-02-09: 3.5 mg via INTRAVENOUS

## 2014-02-09 MED ORDER — SODIUM CHLORIDE 0.9 % IJ SOLN
INTRAMUSCULAR | Status: AC
  Filled 2014-02-09: qty 10

## 2014-02-09 MED ORDER — ACETAMINOPHEN 325 MG PO TABS: 650.0000 mg | ORAL_TABLET | ORAL | Status: DC | PRN

## 2014-02-09 MED ORDER — FENTANYL CITRATE 0.05 MG/ML IJ SOLN
INTRAMUSCULAR | Status: AC
  Filled 2014-02-09: qty 2

## 2014-02-09 MED ORDER — LIDOCAINE HCL (CARDIAC) 20 MG/ML IV SOLN
INTRAVENOUS | Status: AC
  Filled 2014-02-09: qty 5

## 2014-02-09 MED ORDER — ROCURONIUM BROMIDE 100 MG/10ML IV SOLN
INTRAVENOUS | Status: AC
  Filled 2014-02-09: qty 1

## 2014-02-09 MED ORDER — ONDANSETRON HCL 4 MG/2ML IJ SOLN: 4.0000 mg | INTRAMUSCULAR | Status: DC | PRN

## 2014-02-09 MED ORDER — PROPOFOL 10 MG/ML IV BOLUS
INTRAVENOUS | Status: DC | PRN
  Administered 2014-02-09: 200 mg via INTRAVENOUS

## 2014-02-09 MED ORDER — PROMETHAZINE HCL 25 MG/ML IJ SOLN: 6.2500 mg | INTRAMUSCULAR | Status: DC | PRN

## 2014-02-09 MED ORDER — LOSARTAN POTASSIUM 50 MG PO TABS
100.0000 mg | ORAL_TABLET | Freq: Every morning | ORAL | Status: DC
  Administered 2014-02-09 – 2014-02-10 (×2): 100 mg via ORAL
  Filled 2014-02-09 (×2): qty 2

## 2014-02-09 MED ORDER — LACTATED RINGERS IV SOLN
INTRAVENOUS | Status: DC | PRN
  Administered 2014-02-09 (×2): via INTRAVENOUS

## 2014-02-09 MED ORDER — LACTATED RINGERS IV SOLN: INTRAVENOUS | Status: DC

## 2014-02-09 MED ORDER — MIDAZOLAM HCL 2 MG/2ML IJ SOLN
INTRAMUSCULAR | Status: AC
  Filled 2014-02-09: qty 2

## 2014-02-09 MED ORDER — FENTANYL CITRATE 0.05 MG/ML IJ SOLN
INTRAMUSCULAR | Status: AC
  Filled 2014-02-09: qty 5

## 2014-02-09 MED ORDER — FENTANYL CITRATE 0.05 MG/ML IJ SOLN
25.0000 ug | INTRAMUSCULAR | Status: DC | PRN
  Administered 2014-02-09 (×2): 25 ug via INTRAVENOUS
  Administered 2014-02-09: 50 ug via INTRAVENOUS

## 2014-02-09 MED ORDER — ONDANSETRON HCL 4 MG/2ML IJ SOLN
INTRAMUSCULAR | Status: AC
  Filled 2014-02-09: qty 2

## 2014-02-09 MED ORDER — PROPOFOL 10 MG/ML IV BOLUS
INTRAVENOUS | Status: AC
  Filled 2014-02-09: qty 20

## 2014-02-09 MED ORDER — OXYCODONE HCL 5 MG PO TABS
30.0000 mg | ORAL_TABLET | ORAL | Status: DC | PRN
  Administered 2014-02-10 (×3): 30 mg via ORAL
  Filled 2014-02-09 (×3): qty 6

## 2014-02-09 MED ORDER — GLYCOPYRROLATE 0.2 MG/ML IJ SOLN
INTRAMUSCULAR | Status: AC
  Filled 2014-02-09: qty 1

## 2014-02-09 MED ORDER — CIPROFLOXACIN HCL 500 MG PO TABS: 500.0000 mg | ORAL_TABLET | Freq: Two times a day (BID) | ORAL | Status: DC

## 2014-02-09 MED ORDER — GLYCOPYRROLATE 0.2 MG/ML IJ SOLN
INTRAMUSCULAR | Status: DC | PRN
  Administered 2014-02-09: 0.2 mg via INTRAVENOUS
  Administered 2014-02-09: 0.4 mg via INTRAVENOUS

## 2014-02-09 MED ORDER — SODIUM CHLORIDE 0.9 % IR SOLN
Status: DC | PRN
  Administered 2014-02-09: 1000 mL via INTRAVESICAL

## 2014-02-09 MED ORDER — CEFAZOLIN SODIUM-DEXTROSE 2-3 GM-% IV SOLR
2.0000 g | INTRAVENOUS | Status: AC
  Administered 2014-02-09: 2 g via INTRAVENOUS

## 2014-02-09 MED ORDER — SODIUM CHLORIDE 0.9 % IV BOLUS (SEPSIS)
1000.0000 mL | Freq: Once | INTRAVENOUS | Status: AC
  Administered 2014-02-09: 1000 mL via INTRAVENOUS

## 2014-02-09 MED ORDER — HEPARIN SODIUM (PORCINE) 1000 UNIT/ML IJ SOLN
INTRAMUSCULAR | Status: AC
Start: 2014-02-09 — End: 2014-02-09
  Filled 2014-02-09: qty 1

## 2014-02-09 MED ORDER — HYDROMORPHONE HCL PF 1 MG/ML IJ SOLN
0.2500 mg | INTRAMUSCULAR | Status: DC | PRN
  Administered 2014-02-09 (×4): 0.5 mg via INTRAVENOUS

## 2014-02-09 MED ORDER — BUPIVACAINE-EPINEPHRINE (PF) 0.25% -1:200000 IJ SOLN
INTRAMUSCULAR | Status: AC
Start: 2014-02-09 — End: 2014-02-09
  Filled 2014-02-09: qty 30

## 2014-02-09 SURGICAL SUPPLY — 44 items
CABLE HIGH FREQUENCY MONO STRZ (ELECTRODE) ×3 IMPLANT
CATH FOLEY 2WAY SLVR 18FR 30CC (CATHETERS) ×3 IMPLANT
CATH ROBINSON RED A/P 16FR (CATHETERS) ×3 IMPLANT
CATH TIEMANN FOLEY 18FR 5CC (CATHETERS) ×3 IMPLANT
CHLORAPREP W/TINT 26ML (MISCELLANEOUS) ×3 IMPLANT
CLIP LIGATING HEM O LOK PURPLE (MISCELLANEOUS) ×6 IMPLANT
CLOTH BEACON ORANGE TIMEOUT ST (SAFETY) ×3 IMPLANT
COVER SURGICAL LIGHT HANDLE (MISCELLANEOUS) ×3 IMPLANT
COVER TIP SHEARS 8 DVNC (MISCELLANEOUS) ×2 IMPLANT
COVER TIP SHEARS 8MM DA VINCI (MISCELLANEOUS) ×1
CUTTER ECHEON FLEX ENDO 45 340 (ENDOMECHANICALS) ×3 IMPLANT
DECANTER SPIKE VIAL GLASS SM (MISCELLANEOUS) IMPLANT
DRAPE SURG IRRIG POUCH 19X23 (DRAPES) ×3 IMPLANT
DRSG TEGADERM 2-3/8X2-3/4 SM (GAUZE/BANDAGES/DRESSINGS) ×12 IMPLANT
DRSG TEGADERM 4X4.75 (GAUZE/BANDAGES/DRESSINGS) ×6 IMPLANT
DRSG TEGADERM 6X8 (GAUZE/BANDAGES/DRESSINGS) ×6 IMPLANT
ELECT REM PT RETURN 9FT ADLT (ELECTROSURGICAL) ×3
ELECTRODE REM PT RTRN 9FT ADLT (ELECTROSURGICAL) ×2 IMPLANT
GAUZE SPONGE 2X2 8PLY STRL LF (GAUZE/BANDAGES/DRESSINGS) ×2 IMPLANT
GLOVE BIO SURGEON STRL SZ 6.5 (GLOVE) ×3 IMPLANT
GLOVE BIOGEL M STRL SZ7.5 (GLOVE) ×6 IMPLANT
GOWN STRL REUS W/TWL LRG LVL3 (GOWN DISPOSABLE) ×9 IMPLANT
HOLDER FOLEY CATH W/STRAP (MISCELLANEOUS) ×3 IMPLANT
IV LACTATED RINGERS 1000ML (IV SOLUTION) IMPLANT
KIT ACCESSORY DA VINCI DISP (KITS) ×1
KIT ACCESSORY DVNC DISP (KITS) ×2 IMPLANT
NDL SAFETY ECLIPSE 18X1.5 (NEEDLE) ×2 IMPLANT
NEEDLE HYPO 18GX1.5 SHARP (NEEDLE) ×1
PACK ROBOT UROLOGY CUSTOM (CUSTOM PROCEDURE TRAY) ×3 IMPLANT
RELOAD GREEN ECHELON 45 (STAPLE) ×3 IMPLANT
SEALER TISSUE G2 CVD JAW 45CM (ENDOMECHANICALS) IMPLANT
SET TUBE IRRIG SUCTION NO TIP (IRRIGATION / IRRIGATOR) ×3 IMPLANT
SOLUTION ELECTROLUBE (MISCELLANEOUS) ×3 IMPLANT
SPONGE GAUZE 2X2 STER 10/PKG (GAUZE/BANDAGES/DRESSINGS) ×1
SUT ETHILON 3 0 PS 1 (SUTURE) ×3 IMPLANT
SUT VIC AB 2-0 SH 27 (SUTURE)
SUT VIC AB 2-0 SH 27X BRD (SUTURE) IMPLANT
SUT VICRYL 0 UR6 27IN ABS (SUTURE) ×3 IMPLANT
SUT VLOC BARB 180 ABS3/0GR12 (SUTURE) ×6
SUTURE VLOC BRB 180 ABS3/0GR12 (SUTURE) ×4 IMPLANT
SYRINGE 1CC 27X.5 TB SAFETYGLD (MISCELLANEOUS) ×3 IMPLANT
TOWEL OR 17X26 10 PK STRL BLUE (TOWEL DISPOSABLE) ×3 IMPLANT
TOWEL OR NON WOVEN STRL DISP B (DISPOSABLE) ×3 IMPLANT
WATER STERILE IRR 1500ML POUR (IV SOLUTION) IMPLANT

## 2014-02-09 NOTE — Anesthesia Preprocedure Evaluation (Addendum)
Anesthesia Evaluation  Patient identified by MRN, date of birth, ID band Patient awake    Reviewed: Allergy & Precautions, H&P , NPO status , Patient's Chart, lab work & pertinent test results  Airway Mallampati: II TM Distance: >3 FB Neck ROM: Full    Dental no notable dental hx. (+) Missing,    Pulmonary former smoker,  breath sounds clear to auscultation  Pulmonary exam normal       Cardiovascular hypertension, Pt. on medications Rhythm:Regular Rate:Normal     Neuro/Psych negative neurological ROS  negative psych ROS   GI/Hepatic negative GI ROS, Neg liver ROS,   Endo/Other  negative endocrine ROS  Renal/GU negative Renal ROS  negative genitourinary   Musculoskeletal negative musculoskeletal ROS (+)   Abdominal   Peds negative pediatric ROS (+)  Hematology  (+) Blood dyscrasia, , Multiple myeloma   Anesthesia Other Findings   Reproductive/Obstetrics negative OB ROS                         Anesthesia Physical Anesthesia Plan  ASA: III  Anesthesia Plan: General   Post-op Pain Management:    Induction: Intravenous  Airway Management Planned: Oral ETT  Additional Equipment:   Intra-op Plan:   Post-operative Plan: Extubation in OR  Informed Consent: I have reviewed the patients History and Physical, chart, labs and discussed the procedure including the risks, benefits and alternatives for the proposed anesthesia with the patient or authorized representative who has indicated his/her understanding and acceptance.   Dental advisory given  Plan Discussed with: CRNA  Anesthesia Plan Comments:         Anesthesia Quick Evaluation

## 2014-02-09 NOTE — Op Note (Signed)
Preoperative diagnosis: Clinical stage T1c Adenocarcinoma prostate  Postoperative diagnosis: Same  Procedure: Robotic-assisted laparoscopic radical retropubic prostatectomy with bilateral pelvic lymph node dissection  Surgeon: Bernestine Amass, MD  Asst.: Leta Baptist, PA  Anesthesia: Gen. Endotracheal  Indications: Patient was diagnosed with clinical stage TIc Adenocarcinoma the prostate. He underwent extensive consultation with regard to treatment options. The patient decided on a surgical approach. He appeared to understand the distinct advantages as well as the disadvantages of this procedure. The patient has performed a mechanical bowel prep. He has had placement of PAS compression boots and has received perioperative antibiotics. The patient's preoperative PSA was 13. Ultrasound revealed a 30 g prostate.   Technique and findings:The patient was brought to the operating room and had successful induction of general endotracheal anesthesia.the patient was placed in a low lithotomy position with careful padding of all extremities. He was secured to the operative table and placed in the steep Trendelenburg position. He was prepped and draped in usual manner. A Foley catheter was placed sterilely on the field. Camera port site was chosen 18 cm above the pubic symphysis just to the left of the umbilicus. A standard open Hassan technique was utilized. A 12 mm trocar was placed without difficulty. The camera was then inserted and no abnormalities were noted within the pelvis. The trochars were placed with direct visual guidance. This included 3 42mm robotic trochars and a 12 mm and 5 mm assist ports. Once all the ports were placed the robot was docked. The bladder was filled and the space of Retzius was developed with electrocautery dissection as well as blunt dissection. Superficial fat off the endopelvic fascia and bladder neck was removed with electrocautery scissors. The endopelvic fascia was then incised  bilaterally from base to apex. Levator musculature was swept off the apex of the prostate isolating the dorsal venous complex which was then stapled with the ETS stapling device. The anterior bladder neck was identified with the aid of the Foley balloon. This was then transected down to the Foley catheter with electrocautery scissors. The Foley catheter was then retracted anteriorly. We appeared to be well away from the ureteral orifices. The posterior bladder neck was then transected and the dissection carried down to the adnexal structures. The seminal vesicles and vas deferens on both sides were then individually dissected free and retracted anteriorly. The posterior plane between the rectum and prostate was then established primarily with blunt dissection.  Attention was then turned towards nerve sparing. The patient was felt to be a candidate for bilateral nerve sparing. Superficial fascia along the anterior lateral aspect of the prostate was incised bilaterally. This tissue was then swept laterally until we were able to establish a groove between the neurovascular tissue and the posterior lateral aspect on the prostate bilaterally. This groove was then extended from the apex back to the base of the prostate. With the prostate retracted anteriorly the vascular pedicles of the prostate were taken with Hemolock clips. The Foley catheter was then reinserted and the anterior urethra was transected. The posterior urethra was then transected as were some rectourethralis fibers. The prostate was then removed from the pelvis. The pelvis was then copiously irrigated. Rectal insufflation was performed and there was no evidence of rectal injury.  Attention was then turned towards bilateral pelvic lymph node dissection. The obturator node packets were removed I laterally and the dissection extended towards the bifurcation of the iliac artery. The obturator nerve was identified on both sides and preserved. Hemalock clips  were used for small veins and lymphatic channels. The node packets were sent for permanent analysis.  Attention was then turned towards reconstruction. The bladder neck did not require any reconstruction. The bladder neck and posterior urethra were reapproximated at the 6:00 position utilizing a 2-0 Vicryl suture. The rest of the anastomosis was done with a double-armed 3-0 V-lock suture in a 360 degree manner. A new catheter was placed and bladder irrigation revealed no evidence of leakage. A Blake drain was placed through one of the robotic trochars and positioned in the retropubic space above the anastomosis. This was then secured to the skin with a nylon suture. The prostate was placed in the Endopouch retrieval bag. The 12 mm trocar site was closed with a Vicryl suture with the aid of a suture passer. Our other trochars were taken out with direct visual guidance without evidence of any bleeding. The camera port incision was extended slightly to allow for removal of the specimen and then closed with a running Vicryl suture. All port sites were infiltrated with Marcaine and then closed with surgical clips. The patient was then taken to recovery room having had no obvious complications or problems. Sponge and needle counts were correct.

## 2014-02-09 NOTE — Transfer of Care (Signed)
Immediate Anesthesia Transfer of Care Note  Patient: Travis Nguyen  Procedure(s) Performed: Procedure(s): ROBOTIC ASSISTED LAPAROSCOPIC RADICAL PROSTATECTOMY (N/A) LYMPHADENECTOMY (Bilateral)  Patient Location: PACU  Anesthesia Type:General  Level of Consciousness: awake, alert  and oriented  Airway & Oxygen Therapy: Patient Spontanous Breathing  Post-op Assessment: Report given to PACU RN and Post -op Vital signs reviewed and stable  Post vital signs: Reviewed and stable  Complications: No apparent anesthesia complications

## 2014-02-09 NOTE — Discharge Instructions (Signed)

## 2014-02-09 NOTE — Anesthesia Postprocedure Evaluation (Signed)
  Anesthesia Post-op Note  Patient: Travis Nguyen  Procedure(s) Performed: Procedure(s) (LRB): ROBOTIC ASSISTED LAPAROSCOPIC RADICAL PROSTATECTOMY (N/A) LYMPHADENECTOMY (Bilateral)  Patient Location: PACU  Anesthesia Type: General  Level of Consciousness: awake and alert   Airway and Oxygen Therapy: Patient Spontanous Breathing  Post-op Pain: mild  Post-op Assessment: Post-op Vital signs reviewed, Patient's Cardiovascular Status Stable, Respiratory Function Stable, Patent Airway and No signs of Nausea or vomiting  Last Vitals:  Filed Vitals:   02/09/14 1400  BP: 149/82  Pulse: 83  Temp: 36.3 C  Resp: 15    Post-op Vital Signs: stable   Complications: No apparent anesthesia complications

## 2014-02-09 NOTE — Interval H&P Note (Signed)
History and Physical Interval Note:  02/09/2014 8:19 AM  Travis Nguyen  has presented today for surgery, with the diagnosis of PROSTATE CANCER  The various methods of treatment have been discussed with the patient and family. After consideration of risks, benefits and other options for treatment, the patient has consented to  Procedure(s): ROBOTIC ASSISTED LAPAROSCOPIC RADICAL PROSTATECTOMY (N/A) LYMPHADENECTOMY (Bilateral) as a surgical intervention .  The patient's history has been reviewed, patient examined, no change in status, stable for surgery.  I have reviewed the patient's chart and labs.  Questions were answered to the patient's satisfaction.     Mikyla Schachter S

## 2014-02-10 ENCOUNTER — Encounter (HOSPITAL_COMMUNITY): Payer: Self-pay | Admitting: Urology

## 2014-02-10 LAB — CREATININE, FLUID (PLEURAL, PERITONEAL, JP DRAINAGE): Creat, Fluid: 0.9 mg/dL

## 2014-02-10 LAB — BASIC METABOLIC PANEL
ANION GAP: 9 (ref 5–15)
BUN: 8 mg/dL (ref 6–23)
CHLORIDE: 104 meq/L (ref 96–112)
CO2: 24 meq/L (ref 19–32)
Calcium: 8.4 mg/dL (ref 8.4–10.5)
Creatinine, Ser: 0.84 mg/dL (ref 0.50–1.35)
GFR calc Af Amer: >90 mL/min (ref >90)
GFR calc non Af Amer: >90 mL/min (ref >90)
GLUCOSE: 117 mg/dL — AB (ref 70–99)
POTASSIUM: 4.2 meq/L (ref 3.7–5.3)
Sodium: 137 mEq/L (ref 137–147)

## 2014-02-10 LAB — HEMOGLOBIN AND HEMATOCRIT, BLOOD
HEMATOCRIT: 32.9 % — AB (ref 39.0–52.0)
HEMOGLOBIN: 11.2 g/dL — AB (ref 13.0–17.0)

## 2014-02-10 NOTE — Care Management Note (Addendum)
    Page 1 of 1   02/10/2014     12:57:27 PM CARE MANAGEMENT NOTE 02/10/2014  Patient:  Travis Nguyen, Travis Nguyen   Account Number:  [id number]  Date Initiated:  02/10/2014  Documentation initiated by:  [facility]  Subjective/Objective Assessment:   63 Y/O M ADMITTED W/PROSTATE CA.     Action/Plan:   FROM HOME W/SPUSE.HAS PCP,PHARMACY.   Anticipated DC Date:  02/10/2014   Anticipated DC Plan:  [place]  CM consult      Choice offered to / List presented to:             Status of service:  Completed, signed off Medicare Important Message given?   (If response is "NO", the following Medicare IM given date fields will be blank) Date Medicare IM given:   Medicare IM given by:   Date Additional Medicare IM given:   Additional Medicare IM given by:    Discharge Disposition:  HOME/SELF CARE  Per UR Regulation:  Reviewed for med. necessity/level of care/duration of stay  If discussed at [place] of Stay Meetings, dates discussed:    Comments:  02/10/14 Desaray Marschner RN,BSN NCM [place].PAITENT/FAMILY HAS GOOD SUPPORT.NO ANTICIPATED HHC NEEDS.

## 2014-02-10 NOTE — Discharge Summary (Signed)
Date of admission: 02/09/2014  Date of discharge: 02/10/2014  Admission diagnosis: Prostate Cancer  Discharge diagnosis: Prostate Cancer  History and Physical: For full details, please see admission history and physical. Briefly, Travis Nguyen is a 63 y.o. gentleman with localized prostate cancer.  After discussing management/treatment options, he elected to proceed with surgical treatment.  Hospital Course: ABDULRAHIM SIDDIQI was taken to the operating room on 02/09/2014 and underwent a robotic assisted laparoscopic radical prostatectomy. He tolerated this procedure well and without complications. Postoperatively, he was able to be transferred to a regular hospital room following recovery from anesthesia.  He was able to begin ambulating the night of surgery. He remained hemodynamically stable overnight.  He had excellent urine output with appropriately minimal output from his pelvic drain and his pelvic drain was removed on POD #1.  He was transitioned to oral pain medication, tolerated a clear liquid diet, and had met all discharge criteria and was able to be discharged home later on POD#1.  Laboratory values:  Recent Labs  02/09/14 1248 02/10/14 0430  HGB 12.1* 11.2*  HCT 35.6* 32.9*    Disposition: Home  Discharge instruction: He was instructed to be ambulatory but to refrain from heavy lifting, strenuous activity, or driving. He was instructed on urethral catheter care.  Discharge medications:     Medication List    STOP taking these medications       aspirin EC 81 MG tablet      TAKE these medications       ciprofloxacin 500 MG tablet  Commonly known as:  CIPRO  Take 1 tablet (500 mg total) by mouth 2 (two) times daily. Start day prior to office visit for foley removal     lenalidomide 10 MG capsule  Commonly known as:  REVLIMID  - Take 1 capsule (10 mg total) by mouth See admin instructions. Take 1 capsule for the next 21 days then rest for 7  - Patient stopped  for procedure on 02/02/2014.     losartan 100 MG tablet  Commonly known as:  COZAAR  Take 100 mg by mouth every morning.     oxycodone 30 MG immediate release tablet  Commonly known as:  ROXICODONE  Take 1 tablet (30 mg total) by mouth every 4 (four) hours as needed for pain.        Followup: He will followup on 8/6 at 10:15 at [facility] for catheter removal and to discuss his surgical pathology results.

## 2014-02-10 NOTE — Progress Notes (Signed)
1 Day Post-Op Subjective: The patient is doing well.  No nausea or vomiting. Pain is adequately controlled. Has ambulated. No flatus/bm. No complaints  Objective: Vital signs in last 24 hours: Temp:  [97.4 F (36.3 C)-98.9 F (37.2 C)] 98.9 F (37.2 C) (07/30 0456) Pulse Rate:  [69-87] 71 (07/30 0456) Resp:  [10-19] 18 (07/30 0456) BP: (107-149)/(47-84) 112/65 mmHg (07/30 0456) SpO2:  [100 %] 100 % (07/30 0456)  Intake/Output from previous day: 07/29 0701 - 07/30 0700 In: 7177.5 [P.O.:1440; I.V.:4687.5; IV Piggyback:1050] Out: 1610 [Urine:4625; Drains:230; Blood:75] Intake/Output this shift:    Physical Exam:  General: Alert and oriented. CV: RRR Lungs: Normal effort. GI: Soft, Nondistended. Incisions: Dressings intact. Urine: Clear Extremities: Nontender, no erythema, no edema.  Lab Results:  Recent Labs  02/07/14 0920 02/09/14 1248 02/10/14 0430  HGB 13.4 12.1* 11.2*  HCT 39.5 35.6* 32.9*    Assessment/Plan: POD# 1 s/p robotic prostatectomy.  1) SL IVF 2) Ambulate, Incentive spirometry 3) Transition to oral pain medication 4) Dulcolax suppository 5) D/C pelvic drain it stat fluid creatinine consistent with serum 6) Plan for likely discharge later today   Bernestine Amass, MD

## 2014-02-18 ENCOUNTER — Ambulatory Visit: Payer: Medicaid Other | Admitting: Oncology

## 2014-02-18 ENCOUNTER — Other Ambulatory Visit: Payer: Medicaid Other

## 2014-02-21 ENCOUNTER — Other Ambulatory Visit: Payer: Self-pay | Admitting: *Deleted

## 2014-02-21 DIAGNOSIS — C61 Malignant neoplasm of prostate: Secondary | ICD-10-CM

## 2014-02-21 MED ORDER — LENALIDOMIDE 10 MG PO CAPS: 10.0000 mg | ORAL_CAPSULE | Freq: Every day | ORAL | Status: DC

## 2014-02-21 NOTE — Telephone Encounter (Signed)
New order created for a provider signature.

## 2014-02-21 NOTE — Telephone Encounter (Signed)
Walk-in form received reads "I need my cancer pills and need to talk with doctor's secretary".  Patient reports his last revlimid order sent in on July 27th was not shipped but transferred to another pharmacy due to insurance.  Also reports he needs help paying for bladder control supplies or adult diapers.  Asked if this is something his PCP or this office should take care of.  Needs bladder supplies post surgery.  Managed Care  and the financial advocates recommend trying a medical supply store.  [facility]) reports Medicaid does cover these supplies with an order faxed attn. Commercial Account Team or C.A.T. To [phone].    Called [employer] and learned Revlimid order was transferred to [facility] and then transferred to [facility].  This nurse sent eRx order to [facility] with the authorization number which expires on 02-27-2014.   11:50 Spoke with wife and gave information for Revlimid order being sent and to ask Dr. Cy Blamer office to fax an order for adult incontinece supplies.  Fax number given and confirmed she knows where [facility] is located.  Reports they are going to [facility] now.

## 2014-02-22 ENCOUNTER — Telehealth: Payer: Self-pay

## 2014-02-22 ENCOUNTER — Telehealth: Payer: Self-pay | Admitting: *Deleted

## 2014-02-22 MED ORDER — LENALIDOMIDE 10 MG PO CAPS: ORAL_CAPSULE | ORAL | Status: DC

## 2014-02-22 NOTE — Telephone Encounter (Signed)
PT. HAD CALLED [employer] ABOUT HIS CANCER MEDICATION AND WAS VERY UPSET AT THE COST OF HIS COPAY. ASKED PT. IF [employer] OFFERED ANY FINANCIAL ASSISTANCE. [employer] DID HAVE FINANCIAL ASSISTANCE BUT HE HUNG UP ON THE REPRESENTATIVE AND CAME TO THE CANCER CENTER TO TALK WITH THE DOCTOR ABOUT THE COST OF HIS CANCER MEDICATION. EXPLAINED TO PT. THAT HE NEEDED TO CALL [employer] AND FIND OUT WHAT KIND OF FINANCIAL ASSISTANCE IS AVAILABLE FOR HIM. PT. WAS GIVEN [place] PHONE NUMBER TO CONTACT TOMORROW.

## 2014-02-22 NOTE — Telephone Encounter (Signed)
rec'd call from specialty pharmacy. They are unable to reach patient. They have his medication ready. i called patient to tell him the pharmacy had his medication and they were having difficulty reaching him. Gave him the number to call and let them know when he would be available for delivery. Patient became agitated, saying he had all of this straight when he left the hospital and everything was okay when he was seeing dr Juliann Mule. He hung up.

## 2014-02-22 NOTE — Addendum Note (Signed)
Addended by: Cherylynn Ridges on: 02/22/2014 11:45 AM   Modules accepted: Orders

## 2014-02-22 NOTE — Telephone Encounter (Signed)
Manuela Schwartz from [place] called. Pt has rx for revlimid at [facility] that is dated from 7/17. The authorization number is only good for 5 more days. Pt needs copay assistance. Pt told susan [facility] will help with that and hung up on her. Manuela Schwartz mentioned 3 copay assistance programs pt may qualify for. First: chronic disease fund phone [phone]. Second: PAN program phone# [phone]. Third: LLS program phone# [phone]. This information forwarded to [facility] to help pt.

## 2014-02-22 NOTE — Telephone Encounter (Signed)
Called [employer] to confirm receipt of revlimid order.  Travis Nguyen was not able to find order for member number [phone].  Given urgent fax number [phone] and faxed revlimid at this time.  Was suggested I try eRx but this did not work yesterday with two attempts.

## 2014-02-23 ENCOUNTER — Encounter: Payer: Self-pay | Admitting: Oncology

## 2014-02-23 NOTE — Progress Notes (Signed)
Received letter from [employer].  Pt is approved for medication expenses related to MM from 02/23/14 to 02/23/15 or when the benefit cap has been met.  Expenses for reimbursement can be submitted for dos 11/25/13 to 02/23/15.  The amount of the grant is $10,000.  I called [employer] and gave them the needed info to access the grant.

## 2014-03-01 ENCOUNTER — Other Ambulatory Visit: Payer: Self-pay | Admitting: *Deleted

## 2014-03-01 DIAGNOSIS — C61 Malignant neoplasm of prostate: Secondary | ICD-10-CM

## 2014-03-01 MED ORDER — LENALIDOMIDE 10 MG PO CAPS: ORAL_CAPSULE | ORAL | Status: DC

## 2014-03-09 ENCOUNTER — Telehealth: Payer: Self-pay | Admitting: Oncology

## 2014-03-09 ENCOUNTER — Encounter: Payer: Self-pay | Admitting: Oncology

## 2014-03-09 ENCOUNTER — Other Ambulatory Visit (HOSPITAL_BASED_OUTPATIENT_CLINIC_OR_DEPARTMENT_OTHER): Payer: No Typology Code available for payment source

## 2014-03-09 ENCOUNTER — Ambulatory Visit (HOSPITAL_BASED_OUTPATIENT_CLINIC_OR_DEPARTMENT_OTHER): Payer: No Typology Code available for payment source

## 2014-03-09 ENCOUNTER — Ambulatory Visit (HOSPITAL_BASED_OUTPATIENT_CLINIC_OR_DEPARTMENT_OTHER): Payer: No Typology Code available for payment source | Admitting: Oncology

## 2014-03-09 VITALS — BP 139/83 | HR 69 | Temp 98.6°F | Resp 18 | Ht 71.0 in | Wt 157.0 lb

## 2014-03-09 VITALS — BP 141/79 | HR 68 | Temp 98.5°F | Resp 20

## 2014-03-09 DIAGNOSIS — C61 Malignant neoplasm of prostate: Secondary | ICD-10-CM

## 2014-03-09 DIAGNOSIS — C9000 Multiple myeloma not having achieved remission: Secondary | ICD-10-CM

## 2014-03-09 DIAGNOSIS — Z95828 Presence of other vascular implants and grafts: Secondary | ICD-10-CM

## 2014-03-09 DIAGNOSIS — G8929 Other chronic pain: Secondary | ICD-10-CM

## 2014-03-09 DIAGNOSIS — M549 Dorsalgia, unspecified: Secondary | ICD-10-CM

## 2014-03-09 LAB — CBC WITH DIFFERENTIAL/PLATELET
BASO%: 0.7 % (ref 0.0–2.0)
Basophils Absolute: 0 10*3/uL (ref 0.0–0.1)
EOS%: 1.9 % (ref 0.0–7.0)
Eosinophils Absolute: 0.1 10*3/uL (ref 0.0–0.5)
HCT: 38.1 % — ABNORMAL LOW (ref 38.4–49.9)
HGB: 12.5 g/dL — ABNORMAL LOW (ref 13.0–17.1)
LYMPH#: 0.9 10*3/uL (ref 0.9–3.3)
LYMPH%: 26.1 % (ref 14.0–49.0)
MCH: 31 pg (ref 27.2–33.4)
MCHC: 33 g/dL (ref 32.0–36.0)
MCV: 94.1 fL (ref 79.3–98.0)
MONO#: 0.3 10*3/uL (ref 0.1–0.9)
MONO%: 8 % (ref 0.0–14.0)
NEUT#: 2.1 10*3/uL (ref 1.5–6.5)
NEUT%: 63.3 % (ref 39.0–75.0)
Platelets: 153 10*3/uL (ref 140–400)
RBC: 4.04 10*6/uL — AB (ref 4.20–5.82)
RDW: 14.1 % (ref 11.0–14.6)
WBC: 3.4 10*3/uL — ABNORMAL LOW (ref 4.0–10.3)

## 2014-03-09 LAB — BASIC METABOLIC PANEL (CC13)
Anion Gap: 9 mEq/L (ref 3–11)
BUN: 12 mg/dL (ref 7.0–26.0)
CALCIUM: 9.2 mg/dL (ref 8.4–10.4)
CHLORIDE: 105 meq/L (ref 98–109)
CO2: 24 mEq/L (ref 22–29)
Creatinine: 0.8 mg/dL (ref 0.7–1.3)
Glucose: 115 mg/dl (ref 70–140)
Potassium: 4 mEq/L (ref 3.5–5.1)
SODIUM: 139 meq/L (ref 136–145)

## 2014-03-09 LAB — LACTATE DEHYDROGENASE (CC13): LDH: 199 U/L (ref 125–245)

## 2014-03-09 MED ORDER — SODIUM CHLORIDE 0.9 % IJ SOLN
10.0000 mL | INTRAMUSCULAR | Status: DC | PRN
  Administered 2014-03-09: 10 mL via INTRAVENOUS
  Filled 2014-03-09: qty 10

## 2014-03-09 MED ORDER — HEPARIN SOD (PORK) LOCK FLUSH 100 UNIT/ML IV SOLN
500.0000 [IU] | Freq: Once | INTRAVENOUS | Status: AC
  Administered 2014-03-09: 500 [IU] via INTRAVENOUS
  Filled 2014-03-09: qty 5

## 2014-03-09 NOTE — Patient Instructions (Signed)

## 2014-03-09 NOTE — Telephone Encounter (Signed)
Pt confirmed labs/ov per 08/26 POF, gave pt AVS....KJ °

## 2014-03-09 NOTE — Progress Notes (Signed)
Hematology and Oncology Follow Up Visit  Travis Nguyen [id number] 21-Nov-1950 63 y.o. 03/09/2014 9:01 AM Travis Nguyen, MDNo ref. provider found   Principle Diagnosis: 63 year old gentleman with the following diagnoses:  1. 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 Initially at smoldering subtype of 11% plasma cell in the bone marrow. He subsequently developed active multiple myeloma in February of 2014 with a 23% plasma cell infiltration. 2. Prostate cancer diagnosed in May of 2014. He presented with an elevated PSA of 13.2 stage TI C. clinical staging. His Gleason score was 3+4 equals 7.  Prior Therapy: He was treated initially with Cytoxan, Velcade and Decadron and subsequently his regimen changed to a Velcade, Revlimid with dexamethasone. He had an excellent response with that an M spike that is no longer detected an IgG level within normal range at April 2015. Of note, he did not have any poor prognostic features on his cytogenetics.   He is status post a robotic-assisted laparoscopic radical prostatectomy and bilateral lymph node dissection on 02/09/2014. The final pathology showed prostate adenocarcinoma Gleason score 4+3 equals 7 involving both lobes. Extraprostatic extension was present. The bladder neck margin was negative. Zero out of two lymph nodes were involved.  Current therapy: He is currently on maintenance Revlimid of 10 mg daily. He is awaiting consolidative autologous stem cell transplant  Interim History:  Travis Nguyen presents today for a followup visit. He is a gentleman with the above history is as today he with his wife. He tolerated the operation without any major issues. He still has some incontinence issue that has improved but still remains a concern. He did not report any fevers or chills or sweats. Does not report any hematuria or dysuria. Does not report any headaches or blurry vision or double vision. Does not report any syncope or seizures. He does  not report any skeletal complaints of arthralgias or myalgias. He does not report any chest pain, shortness of breath, palpitation orthopnea or PND. He does not report any shortness of breath or wheezing. His performance status and activity level remains excellent. Rest of his review of systems unremarkable.  Medications: I have reviewed the patient's current medications.  Current Outpatient Prescriptions  Medication Sig Dispense Refill  . lenalidomide (REVLIMID) 10 MG capsule Take 1 capsule (10 mg total) by mouth daily for the next 21 days then rest for 7 days and repeat.  21 capsule  0  . losartan (COZAAR) 100 MG tablet Take 100 mg by mouth every morning.       [name] oxycodone (ROXICODONE) 30 MG immediate release tablet Take 1 tablet (30 mg total) by mouth every 4 (four) hours as needed for pain.  20 tablet  0   No current facility-administered medications for this visit.     Allergies: No Known Allergies  Past Medical History, Surgical history, Social history, and Family History were reviewed and updated.   Physical Exam: Blood pressure 139/83, pulse 69, temperature 98.6 F (37 C), temperature source Oral, resp. rate 18, height _0  (1.803 m), weight 157 lb (71.215 kg), SpO2 100.00%. ECOG: 1 General appearance: alert and cooperative Head: Normocephalic, without obvious abnormality Neck: no adenopathy Lymph nodes: Cervical, supraclavicular, and axillary nodes normal. Heart:regular rate and rhythm, S1, S2 normal, no murmur, click, rub or gallop Lung:chest clear, no wheezing, rales, normal symmetric air entry Abdomin: soft, non-tender, without masses or organomegaly EXT:no erythema, induration, or nodules   Lab Results: Lab Results  Component Value Date   WBC  3.4* 03/09/2014   HGB 12.5* 03/09/2014   HCT 38.1* 03/09/2014   MCV 94.1 03/09/2014   PLT 153 03/09/2014     Chemistry      Component Value Date/Time   NA 137 02/10/2014 0430   NA 140 01/21/2014 0759   K 4.2 02/10/2014 0430    K 4.2 01/21/2014 0759   CL 104 02/10/2014 0430   CL 105 01/01/2013 0835   CO2 24 02/10/2014 0430   CO2 24 01/21/2014 0759   BUN 8 02/10/2014 0430   BUN 14.9 01/21/2014 0759   CREATININE 0.84 02/10/2014 0430   CREATININE 0.9 01/21/2014 0759   CREATININE (Revised) 0.92 07/24/2009 1449      Component Value Date/Time   CALCIUM 8.4 02/10/2014 0430   CALCIUM 9.1 01/21/2014 0759   ALKPHOS 82 01/21/2014 0759   ALKPHOS 51 10/23/2012 1350   AST 21 01/21/2014 0759   AST 31 10/23/2012 1350   ALT 22 01/21/2014 0759   ALT 48 10/23/2012 1350   BILITOT 0.45 01/21/2014 0759   BILITOT 0.3 10/23/2012 1350        Impression and Plan:  63 year old gentleman with the following issues  1.IgG Kappa Multiple Myeloma: He continues to tolerate maintenance chemotherapy well. He now has medicaid approval but elects to defer bone marrow transplant is well healed from his prostate operation. Currently on maintenance revlimid 10 mg daily for 21 days on and then 7 days off. Last M-spike was not detectable. He is on acyclovir 400 mg bid prophylaxis for one month and aspirin 81 mg daily. We will refer back to Dr. Melba Nguyen for further recommendations regarding next treatment once his treatment for prostate cancer is done.  His laboratory data review today and no hematological abnormalities noted.    2. Prostate cancer: He is status post prostatectomy on 02/09/2014. His pathology showed a Gleason score 4+3 equals 7 with disease involving both lobes. He has extraprostatic extension but no lymph node involvement. Will await his post surgery PSA to determine whether he will need adjuvant radiation therapy given has extraprostatic extension. He still having continence issues in this has to be resolved before any consideration of radiation therapy.  3. Chronic back pain: He'll continue on oxycodone every 12 hours.  4. Followup: Will be in one month to assess his progress from a prostate cancer standpoint.   [facility],  MD 8/26/20159:01 AM

## 2014-03-10 LAB — KAPPA/LAMBDA LIGHT CHAINS
Kappa free light chain: 1.27 mg/dL (ref 0.33–1.94)
Kappa:Lambda Ratio: 1.15 (ref 0.26–1.65)
LAMBDA FREE LGHT CHN: 1.1 mg/dL (ref 0.57–2.63)

## 2014-03-10 LAB — IGG, IGA, IGM
IGA: 144 mg/dL (ref 68–379)
IGG (IMMUNOGLOBIN G), SERUM: 1030 mg/dL (ref 650–1600)
IgM, Serum: 85 mg/dL (ref 41–251)

## 2014-03-18 ENCOUNTER — Encounter (HOSPITAL_COMMUNITY): Payer: Self-pay | Admitting: Emergency Medicine

## 2014-03-18 ENCOUNTER — Emergency Department (HOSPITAL_COMMUNITY): Payer: No Typology Code available for payment source

## 2014-03-18 ENCOUNTER — Inpatient Hospital Stay (HOSPITAL_COMMUNITY)
Admission: EM | Admit: 2014-03-18 | Discharge: 2014-03-21 | DRG: 389 | Disposition: A | Discharge status: Home / Self Care | Payer: No Typology Code available for payment source | Attending: Internal Medicine | Admitting: Internal Medicine

## 2014-03-18 DIAGNOSIS — Z9079 Acquired absence of other genital organ(s): Secondary | ICD-10-CM

## 2014-03-18 DIAGNOSIS — R972 Elevated prostate specific antigen [PSA]: Secondary | ICD-10-CM

## 2014-03-18 DIAGNOSIS — K56609 Unspecified intestinal obstruction, unspecified as to partial versus complete obstruction: Secondary | ICD-10-CM | POA: Diagnosis present

## 2014-03-18 DIAGNOSIS — I10 Essential (primary) hypertension: Secondary | ICD-10-CM | POA: Diagnosis present

## 2014-03-18 DIAGNOSIS — C61 Malignant neoplasm of prostate: Secondary | ICD-10-CM | POA: Diagnosis present

## 2014-03-18 DIAGNOSIS — Z8546 Personal history of malignant neoplasm of prostate: Secondary | ICD-10-CM | POA: Diagnosis not present

## 2014-03-18 DIAGNOSIS — Z9889 Other specified postprocedural states: Secondary | ICD-10-CM

## 2014-03-18 DIAGNOSIS — Z79899 Other long term (current) drug therapy: Secondary | ICD-10-CM | POA: Diagnosis not present

## 2014-03-18 DIAGNOSIS — Z87891 Personal history of nicotine dependence: Secondary | ICD-10-CM | POA: Diagnosis not present

## 2014-03-18 DIAGNOSIS — D472 Monoclonal gammopathy: Secondary | ICD-10-CM

## 2014-03-18 DIAGNOSIS — Z5321 Procedure and treatment not carried out due to patient leaving prior to being seen by health care provider: Secondary | ICD-10-CM

## 2014-03-18 DIAGNOSIS — K565 Intestinal adhesions [bands], unspecified as to partial versus complete obstruction: Secondary | ICD-10-CM | POA: Diagnosis present

## 2014-03-18 DIAGNOSIS — C9000 Multiple myeloma not having achieved remission: Secondary | ICD-10-CM | POA: Diagnosis present

## 2014-03-18 DIAGNOSIS — R109 Unspecified abdominal pain: Secondary | ICD-10-CM | POA: Diagnosis present

## 2014-03-18 LAB — URINALYSIS, ROUTINE W REFLEX MICROSCOPIC
Bilirubin Urine: NEGATIVE
Glucose, UA: NEGATIVE mg/dL
Hgb urine dipstick: NEGATIVE
KETONES UR: NEGATIVE mg/dL
Leukocytes, UA: NEGATIVE
NITRITE: NEGATIVE
PH: 7 (ref 5.0–8.0)
PROTEIN: NEGATIVE mg/dL
Specific Gravity, Urine: 1.016 (ref 1.005–1.030)
UROBILINOGEN UA: 0.2 mg/dL (ref 0.0–1.0)

## 2014-03-18 LAB — COMPREHENSIVE METABOLIC PANEL
ALT: 25 U/L (ref 0–53)
ANION GAP: 11 (ref 5–15)
AST: 20 U/L (ref 0–37)
Albumin: 4 g/dL (ref 3.5–5.2)
Alkaline Phosphatase: 80 U/L (ref 39–117)
BUN: 11 mg/dL (ref 6–23)
CALCIUM: 9.8 mg/dL (ref 8.4–10.5)
CO2: 27 mEq/L (ref 19–32)
Chloride: 100 mEq/L (ref 96–112)
Creatinine, Ser: 0.9 mg/dL (ref 0.50–1.35)
GFR calc non Af Amer: >90 mL/min (ref >90)
GLUCOSE: 100 mg/dL — AB (ref 70–99)
Potassium: 4.5 mEq/L (ref 3.7–5.3)
Sodium: 138 mEq/L (ref 137–147)
Total Bilirubin: 0.3 mg/dL (ref 0.3–1.2)
Total Protein: 7.4 g/dL (ref 6.0–8.3)

## 2014-03-18 LAB — CBC WITH DIFFERENTIAL/PLATELET
Basophils Absolute: 0 10*3/uL (ref 0.0–0.1)
Basophils Relative: 0 % (ref 0–1)
EOS ABS: 0.1 10*3/uL (ref 0.0–0.7)
EOS PCT: 2 % (ref 0–5)
HCT: 38.4 % — ABNORMAL LOW (ref 39.0–52.0)
HEMOGLOBIN: 13 g/dL (ref 13.0–17.0)
LYMPHS ABS: 1.6 10*3/uL (ref 0.7–4.0)
Lymphocytes Relative: 30 % (ref 12–46)
MCH: 30.9 pg (ref 26.0–34.0)
MCHC: 33.9 g/dL (ref 30.0–36.0)
MCV: 91.2 fL (ref 78.0–100.0)
Monocytes Absolute: 0.6 10*3/uL (ref 0.1–1.0)
Monocytes Relative: 10 % (ref 3–12)
Neutro Abs: 3.1 10*3/uL (ref 1.7–7.7)
Neutrophils Relative %: 58 % (ref 43–77)
Platelets: 186 10*3/uL (ref 150–400)
RBC: 4.21 MIL/uL — AB (ref 4.22–5.81)
RDW: 13.7 % (ref 11.5–15.5)
WBC: 5.4 10*3/uL (ref 4.0–10.5)

## 2014-03-18 MED ORDER — IOHEXOL 300 MG/ML  SOLN
100.0000 mL | Freq: Once | INTRAMUSCULAR | Status: AC | PRN
  Administered 2014-03-18: 100 mL via INTRAVENOUS

## 2014-03-18 MED ORDER — SODIUM CHLORIDE 0.9 % IV SOLN
INTRAVENOUS | Status: AC
Start: 2014-03-18 — End: 2014-03-18
  Administered 2014-03-18: 07:00:00 via INTRAVENOUS

## 2014-03-18 MED ORDER — IOHEXOL 300 MG/ML  SOLN
50.0000 mL | Freq: Once | INTRAMUSCULAR | Status: AC | PRN
  Administered 2014-03-18: 50 mL via ORAL

## 2014-03-18 MED ORDER — ONDANSETRON HCL 4 MG/2ML IJ SOLN
4.0000 mg | Freq: Four times a day (QID) | INTRAMUSCULAR | Status: DC | PRN
  Administered 2014-03-18 (×2): 4 mg via INTRAVENOUS
  Filled 2014-03-18 (×2): qty 2

## 2014-03-18 MED ORDER — SODIUM CHLORIDE 0.9 % IV BOLUS (SEPSIS)
1000.0000 mL | Freq: Once | INTRAVENOUS | Status: AC
  Administered 2014-03-18: 1000 mL via INTRAVENOUS

## 2014-03-18 MED ORDER — MORPHINE SULFATE 4 MG/ML IJ SOLN
4.0000 mg | Freq: Once | INTRAMUSCULAR | Status: AC
  Administered 2014-03-18: 4 mg via INTRAVENOUS
  Filled 2014-03-18: qty 1

## 2014-03-18 MED ORDER — ONDANSETRON HCL 4 MG PO TABS: 4.0000 mg | ORAL_TABLET | Freq: Four times a day (QID) | ORAL | Status: DC | PRN

## 2014-03-18 MED ORDER — ONDANSETRON HCL 4 MG/2ML IJ SOLN
4.0000 mg | Freq: Once | INTRAMUSCULAR | Status: AC
  Administered 2014-03-18: 4 mg via INTRAVENOUS
  Filled 2014-03-18: qty 2

## 2014-03-18 MED ORDER — MORPHINE SULFATE 2 MG/ML IJ SOLN
2.0000 mg | INTRAMUSCULAR | Status: DC | PRN
  Administered 2014-03-18 (×4): 2 mg via INTRAVENOUS
  Filled 2014-03-18 (×4): qty 1

## 2014-03-18 MED ORDER — HYDRALAZINE HCL 20 MG/ML IJ SOLN: 10.0000 mg | Freq: Four times a day (QID) | INTRAMUSCULAR | Status: DC | PRN

## 2014-03-18 NOTE — Progress Notes (Signed)
UR Completed.  Freman Lapage Jane [phone] 03/18/2014  

## 2014-03-18 NOTE — Progress Notes (Signed)
Re-paged Dr. Carles Collet regarding elevated BP.

## 2014-03-18 NOTE — Progress Notes (Signed)
Patient ID: Travis Nguyen, male   DOB: 1950-10-12, 63 y.o.   MRN: [id number]  [facility] - Progress Note  HD# 1  Subjective: Patient complains of mild abdominal pain.  NG placed.  No BM or flatus this AM.  Objective: Vital signs in last 24 hours: Temp:  [98.2 F (36.8 C)-98.3 F (36.8 C)] 98.3 F (36.8 C) (09/04 0602) Pulse Rate:  [60-63] 60 (09/04 0602) Resp:  [18-20] 18 (09/04 0602) BP: (152-168)/(91-99) 164/98 mmHg (09/04 0602) SpO2:  [99 %-100 %] 100 % (09/04 0602) Weight:  [154 lb (69.854 kg)] 154 lb (69.854 kg) (09/04 0602)    Intake/Output from previous day:    Exam: HEENT - clear, not icteric Neck - soft Chest - clear bilaterally Cor - RRR, no murmur Abd - mild distension; BS present; mild diffuse tenderness, no guarding; well healed incisioins Ext - no significant edema Neuro - grossly intact, no focal deficits  Lab Results:   Recent Labs  03/18/14 0140  WBC 5.4  HGB 13.0  HCT 38.4*  PLT 186     Recent Labs  03/18/14 0140  NA 138  K 4.5  CL 100  CO2 27  GLUCOSE 100*  BUN 11  CREATININE 0.90  CALCIUM 9.8    Studies/Results: Ct Abdomen Pelvis W Contrast  03/18/2014   CLINICAL DATA:  r/o infection Hx recent surgery  EXAM: CT ABDOMEN AND PELVIS WITH CONTRAST  TECHNIQUE: Multidetector CT imaging of the abdomen and pelvis was performed using the standard protocol following bolus administration of intravenous contrast.  CONTRAST:  177mL OMNIPAQUE IOHEXOL 300 MG/ML  SOLN  COMPARISON:  Ultrasound 07/31/2009 and earlier studies  FINDINGS: Visualized lung bases clear. Central venous catheter tip in the distal SVC. 3.8 cm subcapsular lesion in hepatic segment 7 corresponding to hemangioma described on previous ultrasound examinations. Lesion demonstrates peripheral puddling on the initial scan with later fill-in on delayed images. There is an 8 mm low-attenuation subcapsular lesion in segment 5 image 40/2, probably cyst  but too small to accurately characterize. Unremarkable spleen, adrenal glands, pancreas, left kidney. 12 mm cyst in the upper pole right kidney. No hydronephrosis.  Stomach is distended by ingested material. There is incomplete distal passage of oral contrast material. Multiple dilated mid and distal small bowel loops. The terminal ileum and distal loops of small bowel are decompressed but the exact transition point is difficult to pinpoint. There is no mass or abscess identified. No hernia. The colon is decompressed. Appendix not identified. Urinary bladder physiologically distended. There is a small amount of pelvic ascites. Normal vascular enhancement. No adenopathy localized. No free air. Advanced degenerative changes in bilateral hips. Degenerative disc disease L5-S1.  IMPRESSION: 1. High-grade distal small bowel obstruction without evident etiology, suggesting adhesions. 2. Pelvic ascites.   Electronically Signed   By: Arne Cleveland M.D.   On: 03/18/2014 03:00    Assessment / Plan: 1.  Small bowel obstruction, likely secondary to adhesions  NG tube placed to suction  IV hydration  Encouraged ambulation, OOB  Will follow  AXR in AM 9/5  Earnstine Regal, MD, [facility] Office: [phone]  03/18/2014

## 2014-03-18 NOTE — Progress Notes (Signed)
Paged Dr. Carles Collet to notify of elevated systolic BP.

## 2014-03-18 NOTE — ED Notes (Signed)
Pt complains of severe abd pain for three days, he had prostate surgery about 14 days ago, no vomiting or diarrhea

## 2014-03-18 NOTE — Progress Notes (Signed)
Re-paged hospitalist regarding patient's high BP and to notify that patient has no scheduled meds ordered although patient takes cozaar at home.

## 2014-03-18 NOTE — Progress Notes (Signed)
PROGRESS NOTE  Travis Nguyen ZDG:[id number] DOB: January 09, 1951 DOA: 03/18/2014 PCP: [name], MD  Assessment/Plan: Small bowel obstruction -Secondary to adhesions -Appreciate general surgery -Continue NG tube decompression -Encourage ambulation -IV hydration -pain control, anti-emetic Hypertension -When necessary hydralazine -Restart losartan 1 the patient is able to take po Multiple myeloma  -Presently on maintenance therapy with Revlimid  -Unable to take by mouth presently  Prostate cancer  -Status post prostatectomy 02/09/2014   Family Communication:   Pt at beside Disposition Plan:   Home when medically stable       Procedures/Studies: Ct Abdomen Pelvis W Contrast  03/18/2014   CLINICAL DATA:  r/o infection Hx recent surgery  EXAM: CT ABDOMEN AND PELVIS WITH CONTRAST  TECHNIQUE: Multidetector CT imaging of the abdomen and pelvis was performed using the standard protocol following bolus administration of intravenous contrast.  CONTRAST:  126m OMNIPAQUE IOHEXOL 300 MG/ML  SOLN  COMPARISON:  Ultrasound 07/31/2009 and earlier studies  FINDINGS: Visualized lung bases clear. Central venous catheter tip in the distal SVC. 3.8 cm subcapsular lesion in hepatic segment 7 corresponding to hemangioma described on previous ultrasound examinations. Lesion demonstrates peripheral puddling on the initial scan with later fill-in on delayed images. There is an 8 mm low-attenuation subcapsular lesion in segment 5 image 40/2, probably cyst but too small to accurately characterize. Unremarkable spleen, adrenal glands, pancreas, left kidney. 12 mm cyst in the upper pole right kidney. No hydronephrosis.  Stomach is distended by ingested material. There is incomplete distal passage of oral contrast material. Multiple dilated mid and distal small bowel loops. The terminal ileum and distal loops of small bowel are decompressed but the exact transition point is difficult to pinpoint. There  is no mass or abscess identified. No hernia. The colon is decompressed. Appendix not identified. Urinary bladder physiologically distended. There is a small amount of pelvic ascites. Normal vascular enhancement. No adenopathy localized. No free air. Advanced degenerative changes in bilateral hips. Degenerative disc disease L5-S1.  IMPRESSION: 1. High-grade distal small bowel obstruction without evident etiology, suggesting adhesions. 2. Pelvic ascites.   Electronically Signed   By: DArne ClevelandM.D.   On: 03/18/2014 03:00         Subjective: Patient continues to complain of abdominal pain today. He states the morphine works well but is not last month. No vomiting today. Denies fevers, chills, chest pain, shortness breath, rashes. He is not passing flatus   Objective: Filed Vitals:   03/18/14 0500 03/18/14 0602 03/18/14 0947 03/18/14 1436  BP: 153/99 164/98 154/94 162/91  Pulse: 60 60  64  Temp:  98.3 F (36.8 C)  99.1 F (37.3 C)  TempSrc:  Oral  Oral  Resp:  18  20  Height:  6' 1" (1.854 m)    Weight:  69.854 kg (154 lb)    SpO2: 99% 100%  100%    Intake/Output Summary (Last 24 hours) at 03/18/14 1919 Last data filed at 03/18/14 1900  Gross per 24 hour  Intake      0 ml  Output     50 ml  Net    -50 ml   Weight change:  Exam:   General:  Pt is alert, follows commands appropriately, not in acute distress  HEENT: No icterus, No thrush,[place]/AT  Cardiovascular: RRR, S1/S2, no rubs, no gallops  Respiratory: CTA bilaterally, no wheezing, no crackles, no rhonchi  Abdomen: Soft/+BS, diffusely tender without any perineal signs, non distended,  no guarding  Extremities: No edema, No lymphangitis, No petechiae, No rashes, no synovitis  Data Reviewed: Basic Metabolic Panel:  Recent Labs Lab 03/18/14 0140  NA 138  K 4.5  CL 100  CO2 27  GLUCOSE 100*  BUN 11  CREATININE 0.90  CALCIUM 9.8   Liver Function Tests:  Recent Labs Lab 03/18/14 0140  AST 20  ALT 25    ALKPHOS 80  BILITOT 0.3  PROT 7.4  ALBUMIN 4.0   No results found for this basename: LIPASE, AMYLASE,  in the last 168 hours No results found for this basename: AMMONIA,  in the last 168 hours CBC:  Recent Labs Lab 03/18/14 0140  WBC 5.4  NEUTROABS 3.1  HGB 13.0  HCT 38.4*  MCV 91.2  PLT 186   Cardiac Enzymes: No results found for this basename: CKTOTAL, CKMB, CKMBINDEX, TROPONINI,  in the last 168 hours BNP: No components found with this basename: POCBNP,  CBG: No results found for this basename: GLUCAP,  in the last 168 hours  No results found for this or any previous visit (from the past 240 hour(s)).   Scheduled Meds:  Continuous Infusions:    Jermiah Soderman, DO  [facility] Pager [phone]  If 7PM-7AM, please contact night-coverage [email or web address] Password [email or web address] 03/18/2014, 7:19 PM   LOS: 0 days
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
## 2014-03-19 ENCOUNTER — Inpatient Hospital Stay (HOSPITAL_COMMUNITY): Payer: No Typology Code available for payment source

## 2014-03-19 LAB — BASIC METABOLIC PANEL
Anion gap: 13 (ref 5–15)
BUN: 10 mg/dL (ref 6–23)
CO2: 23 mEq/L (ref 19–32)
CREATININE: 0.82 mg/dL (ref 0.50–1.35)
Calcium: 9 mg/dL (ref 8.4–10.5)
Chloride: 102 mEq/L (ref 96–112)
GLUCOSE: 86 mg/dL (ref 70–99)
POTASSIUM: 4.2 meq/L (ref 3.7–5.3)
Sodium: 138 mEq/L (ref 137–147)

## 2014-03-19 LAB — CBC
HCT: 36.2 % — ABNORMAL LOW (ref 39.0–52.0)
Hemoglobin: 12.2 g/dL — ABNORMAL LOW (ref 13.0–17.0)
MCH: 31.7 pg (ref 26.0–34.0)
MCHC: 33.7 g/dL (ref 30.0–36.0)
MCV: 94 fL (ref 78.0–100.0)
PLATELETS: 161 10*3/uL (ref 150–400)
RBC: 3.85 MIL/uL — AB (ref 4.22–5.81)
RDW: 13.9 % (ref 11.5–15.5)
WBC: 4.4 10*3/uL (ref 4.0–10.5)

## 2014-03-19 LAB — URINE CULTURE
CULTURE: NO GROWTH
Colony Count: NO GROWTH

## 2014-03-19 MED ORDER — PHENOL 1.4 % MT LIQD
2.0000 | OROMUCOSAL | Status: DC | PRN
  Filled 2014-03-19: qty 177

## 2014-03-19 MED ORDER — LIP MEDEX EX OINT
1.0000 "application " | TOPICAL_OINTMENT | Freq: Two times a day (BID) | CUTANEOUS | Status: DC
  Administered 2014-03-19 – 2014-03-21 (×4): 1 via TOPICAL
  Filled 2014-03-19: qty 7

## 2014-03-19 MED ORDER — ACETAMINOPHEN 650 MG RE SUPP
650.0000 mg | Freq: Four times a day (QID) | RECTAL | Status: DC | PRN
  Administered 2014-03-20: 650 mg via RECTAL
  Filled 2014-03-19: qty 1

## 2014-03-19 MED ORDER — BISACODYL 10 MG RE SUPP: 10.0000 mg | Freq: Two times a day (BID) | RECTAL | Status: DC | PRN

## 2014-03-19 MED ORDER — MENTHOL 3 MG MT LOZG
1.0000 | LOZENGE | OROMUCOSAL | Status: DC | PRN
  Filled 2014-03-19: qty 9

## 2014-03-19 MED ORDER — DIPHENHYDRAMINE HCL 50 MG/ML IJ SOLN: 12.5000 mg | Freq: Four times a day (QID) | INTRAMUSCULAR | Status: DC | PRN

## 2014-03-19 MED ORDER — LORAZEPAM 2 MG/ML IJ SOLN
0.5000 mg | Freq: Three times a day (TID) | INTRAMUSCULAR | Status: DC | PRN
  Administered 2014-03-19: 1 mg via INTRAVENOUS
  Filled 2014-03-19: qty 1

## 2014-03-19 MED ORDER — METOPROLOL TARTRATE 1 MG/ML IV SOLN
5.0000 mg | Freq: Four times a day (QID) | INTRAVENOUS | Status: DC | PRN
  Filled 2014-03-19: qty 5

## 2014-03-19 MED ORDER — LACTATED RINGERS IV BOLUS (SEPSIS): 1000.0000 mL | Freq: Three times a day (TID) | INTRAVENOUS | Status: AC | PRN

## 2014-03-19 MED ORDER — ALUM & MAG HYDROXIDE-SIMETH 200-200-20 MG/5ML PO SUSP: 30.0000 mL | Freq: Four times a day (QID) | ORAL | Status: DC | PRN

## 2014-03-19 MED ORDER — PROMETHAZINE HCL 25 MG/ML IJ SOLN: 6.2500 mg | INTRAMUSCULAR | Status: DC | PRN

## 2014-03-19 MED ORDER — MAGIC MOUTHWASH
15.0000 mL | Freq: Four times a day (QID) | ORAL | Status: DC | PRN
Start: 2014-03-19 — End: 2014-03-21
  Filled 2014-03-19: qty 15

## 2014-03-19 NOTE — Progress Notes (Signed)
PROGRESS NOTE  Travis Nguyen CWU:[id number] DOB: 1951-07-13 DOA: 03/18/2014 PCP: [name], MD  Assessment/Plan: Small bowel obstruction  -Secondary to adhesions  -Appreciate general surgery  -03/19/2014 2 view abdomen--resolution of small bowel obstruction-->clear liquids on 9/5 -Hopefully discontinue NG in am -Encourage ambulation  -IV hydration  -pain control, anti-emetic   Hypertension  -When necessary hydralazine  -Restart losartan when the patient is able to take po  Multiple myeloma  -Presently on maintenance therapy with Revlimid  -Unable to take by mouth presently  -spoke with Dr. [name] who was on call--do not restart Revlimid until he is d/ced and follows up with [name] Prostate cancer  -Status post prostatectomy 02/09/2014  Family Communication: Pt at beside  Disposition Plan: Home when medically stable          Procedures/Studies: Ct Abdomen Pelvis W Contrast  03/18/2014   CLINICAL DATA:  r/o infection Hx recent surgery  EXAM: CT ABDOMEN AND PELVIS WITH CONTRAST  TECHNIQUE: Multidetector CT imaging of the abdomen and pelvis was performed using the standard protocol following bolus administration of intravenous contrast.  CONTRAST:  127m OMNIPAQUE IOHEXOL 300 MG/ML  SOLN  COMPARISON:  Ultrasound 07/31/2009 and earlier studies  FINDINGS: Visualized lung bases clear. Central venous catheter tip in the distal SVC. 3.8 cm subcapsular lesion in hepatic segment 7 corresponding to hemangioma described on previous ultrasound examinations. Lesion demonstrates peripheral puddling on the initial scan with later fill-in on delayed images. There is an 8 mm low-attenuation subcapsular lesion in segment 5 image 40/2, probably cyst but too small to accurately characterize. Unremarkable spleen, adrenal glands, pancreas, left kidney. 12 mm cyst in the upper pole right kidney. No hydronephrosis.  Stomach is distended by ingested material. There is incomplete  distal passage of oral contrast material. Multiple dilated mid and distal small bowel loops. The terminal ileum and distal loops of small bowel are decompressed but the exact transition point is difficult to pinpoint. There is no mass or abscess identified. No hernia. The colon is decompressed. Appendix not identified. Urinary bladder physiologically distended. There is a small amount of pelvic ascites. Normal vascular enhancement. No adenopathy localized. No free air. Advanced degenerative changes in bilateral hips. Degenerative disc disease L5-S1.  IMPRESSION: 1. High-grade distal small bowel obstruction without evident etiology, suggesting adhesions. 2. Pelvic ascites.   Electronically Signed   By: DArne ClevelandM.D.   On: 03/18/2014 03:00   Dg Abd 2 Views  03/19/2014   CLINICAL DATA:  Small bowel obstruction.  EXAM: ABDOMEN - 2 VIEW  COMPARISON:  CT of the abdomen and pelvis on 03/18/2014  FINDINGS: Nasogastric tube extends into the stomach. No dilated small bowel loops are identified. Oral contrast ingested for CT is present in the colon. No free air.  IMPRESSION: Resolution of small bowel obstruction.   Electronically Signed   By: GAletta EdouardM.D.   On: 03/19/2014 08:27         Subjective: Patient is feeling better today although he was irritated by the NG tube today. Denies fevers, chills, chest discomfort, nausea, vomiting, diarrhea, abdominal pain. He is passing flatus today.  Objective: Filed Vitals:   03/18/14 2132 03/19/14 0500 03/19/14 0918 03/19/14 1359  BP: 139/89 147/79  155/85  Pulse: 63 68  69  Temp: 98.7 F (37.1 C) 99.2 F (37.3 C)  98.9 F (37.2 C)  TempSrc: Oral Oral  Oral  Resp: 18 18  18   Height:  Weight:   68.901 kg (151 lb 14.4 oz)   SpO2: 100% 100%  100%    Intake/Output Summary (Last 24 hours) at 03/19/14 1745 Last data filed at 03/19/14 1704  Gross per 24 hour  Intake    600 ml  Output    660 ml  Net    -60 ml   Weight change:   Exam:   General:  Pt is alert, follows commands appropriately, not in acute distress  HEENT: No icterus, No thrush, Stevens/AT  Cardiovascular: RRR, S1/S2, no rubs, no gallops  Respiratory: CTA bilaterally, no wheezing, no crackles, no rhonchi  Abdomen: Soft/+BS, non tender, non distended, no guarding  Extremities: trace LE edema, No lymphangitis, No petechiae, No rashes, no synovitis  Data Reviewed: Basic Metabolic Panel:  Recent Labs Lab 03/18/14 0140 03/19/14 0520  NA 138 138  K 4.5 4.2  CL 100 102  CO2 27 23  GLUCOSE 100* 86  BUN 11 10  CREATININE 0.90 0.82  CALCIUM 9.8 9.0   Liver Function Tests:  Recent Labs Lab 03/18/14 0140  AST 20  ALT 25  ALKPHOS 80  BILITOT 0.3  PROT 7.4  ALBUMIN 4.0   No results found for this basename: LIPASE, AMYLASE,  in the last 168 hours No results found for this basename: AMMONIA,  in the last 168 hours CBC:  Recent Labs Lab 03/18/14 0140 03/19/14 0520  WBC 5.4 4.4  NEUTROABS 3.1  --   HGB 13.0 12.2*  HCT 38.4* 36.2*  MCV 91.2 94.0  PLT 186 161   Cardiac Enzymes: No results found for this basename: CKTOTAL, CKMB, CKMBINDEX, TROPONINI,  in the last 168 hours BNP: No components found with this basename: POCBNP,  CBG: No results found for this basename: GLUCAP,  in the last 168 hours  Recent Results (from the past 240 hour(s))  URINE CULTURE     Status: None   Collection Time    03/18/14  1:49 AM      Result Value Ref Range Status   Specimen Description URINE, CLEAN CATCH   Final   Special Requests NONE   Final   Culture  Setup Time     Final   Value: 03/18/2014 09:58     Performed at [place]     Final   Value: NO GROWTH     Performed at [facility]   Culture     Final   Value: NO GROWTH     Performed at [facility]   Report Status 03/19/2014 FINAL   Final     Scheduled Meds: . lip balm  1 application Topical BID   Continuous Infusions:    Shyleigh Daughtry,  DO  [facility] Pager [phone]  If 7PM-7AM, please contact night-coverage [email or web address] Password [email or web address] 03/19/2014, 5:45 PM   LOS: 1 day

## 2014-03-19 NOTE — Progress Notes (Signed)
Patient has been much more compliant with clamped tube. Pt has been taking in small amounts of apple juice and ice water without any complications.  Will advance to clear liquid diet as ordered.

## 2014-03-19 NOTE — Progress Notes (Signed)
pts bowel sounds are now present in all quads, pt states he is passing gas as well.

## 2014-03-19 NOTE — Progress Notes (Signed)
Subjective: No further abdominal pain.  No flatus or BM.  Objective: Vital signs in last 24 hours: Temp:  [98.7 F (37.1 C)-99.2 F (37.3 C)] 99.2 F (37.3 C) (09/05 0500) Pulse Rate:  [63-68] 68 (09/05 0500) Resp:  [18-20] 18 (09/05 0500) BP: (139-162)/(79-94) 147/79 mmHg (09/05 0500) SpO2:  [100 %] 100 % (09/05 0500) Last BM Date: 03/17/14  Intake/Output from previous day: 09/04 0701 - 09/05 0700 In: 0  Out: 190 [Urine:50] Intake/Output this shift:    PE: General- In NAD Abdomen-soft, not distended, no tenderness, few bowel sounds, bilious ng tube output  Lab Results:   Recent Labs  03/18/14 0140 03/19/14 0520  WBC 5.4 4.4  HGB 13.0 12.2*  HCT 38.4* 36.2*  PLT 186 161   BMET  Recent Labs  03/18/14 0140 03/19/14 0520  NA 138 138  K 4.5 4.2  CL 100 102  CO2 27 23  GLUCOSE 100* 86  BUN 11 10  CREATININE 0.90 0.82  CALCIUM 9.8 9.0   PT/INR No results found for this basename: LABPROT, INR,  in the last 72 hours Comprehensive Metabolic Panel:    Component Value Date/Time   NA 138 03/19/2014 0520   NA 138 03/18/2014 0140   NA 139 03/09/2014 0819   NA 140 01/21/2014 0759   K 4.2 03/19/2014 0520   K 4.5 03/18/2014 0140   K 4.0 03/09/2014 0819   K 4.2 01/21/2014 0759   CL 102 03/19/2014 0520   CL 100 03/18/2014 0140   CL 105 01/01/2013 0835   CL 104 12/11/2012 0941   CO2 23 03/19/2014 0520   CO2 27 03/18/2014 0140   CO2 24 03/09/2014 0819   CO2 24 01/21/2014 0759   BUN 10 03/19/2014 0520   BUN 11 03/18/2014 0140   BUN 12.0 03/09/2014 0819   BUN 14.9 01/21/2014 0759   CREATININE 0.82 03/19/2014 0520   CREATININE 0.90 03/18/2014 0140   CREATININE 0.8 03/09/2014 0819   CREATININE 0.9 01/21/2014 0759   CREATININE (Revised) 0.92 07/24/2009 1449   GLUCOSE 86 03/19/2014 0520   GLUCOSE 100* 03/18/2014 0140   GLUCOSE 115 03/09/2014 0819   GLUCOSE 105 01/21/2014 0759   GLUCOSE 106* 01/01/2013 0835   GLUCOSE 95 12/11/2012 0941   CALCIUM 9.0 03/19/2014 0520   CALCIUM 9.8 03/18/2014 0140    CALCIUM 9.2 03/09/2014 0819   CALCIUM 9.1 01/21/2014 0759   AST 20 03/18/2014 0140   AST 21 01/21/2014 0759   AST 21 11/26/2013 0914   AST 31 10/23/2012 1350   ALT 25 03/18/2014 0140   ALT 22 01/21/2014 0759   ALT 22 11/26/2013 0914   ALT 48 10/23/2012 1350   ALKPHOS 80 03/18/2014 0140   ALKPHOS 82 01/21/2014 0759   ALKPHOS 90 11/26/2013 0914   ALKPHOS 51 10/23/2012 1350   BILITOT 0.3 03/18/2014 0140   BILITOT 0.45 01/21/2014 0759   BILITOT 0.56 11/26/2013 0914   BILITOT 0.3 10/23/2012 1350   PROT 7.4 03/18/2014 0140   PROT 6.8 01/21/2014 0759   PROT 7.0 11/26/2013 0914   PROT 9.5* 10/23/2012 1350   ALBUMIN 4.0 03/18/2014 0140   ALBUMIN 3.8 01/21/2014 0759   ALBUMIN 4.0 11/26/2013 0914   ALBUMIN 3.5 10/23/2012 1350     Studies/Results: Ct Abdomen Pelvis W Contrast  03/18/2014   CLINICAL DATA:  r/o infection Hx recent surgery  EXAM: CT ABDOMEN AND PELVIS WITH CONTRAST  TECHNIQUE: Multidetector CT imaging of the abdomen and pelvis was performed using the standard  protocol following bolus administration of intravenous contrast.  CONTRAST:  162m OMNIPAQUE IOHEXOL 300 MG/ML  SOLN  COMPARISON:  Ultrasound 07/31/2009 and earlier studies  FINDINGS: Visualized lung bases clear. Central venous catheter tip in the distal SVC. 3.8 cm subcapsular lesion in hepatic segment 7 corresponding to hemangioma described on previous ultrasound examinations. Lesion demonstrates peripheral puddling on the initial scan with later fill-in on delayed images. There is an 8 mm low-attenuation subcapsular lesion in segment 5 image 40/2, probably cyst but too small to accurately characterize. Unremarkable spleen, adrenal glands, pancreas, left kidney. 12 mm cyst in the upper pole right kidney. No hydronephrosis.  Stomach is distended by ingested material. There is incomplete distal passage of oral contrast material. Multiple dilated mid and distal small bowel loops. The terminal ileum and distal loops of small bowel are decompressed but the exact  transition point is difficult to pinpoint. There is no mass or abscess identified. No hernia. The colon is decompressed. Appendix not identified. Urinary bladder physiologically distended. There is a small amount of pelvic ascites. Normal vascular enhancement. No adenopathy localized. No free air. Advanced degenerative changes in bilateral hips. Degenerative disc disease L5-S1.  IMPRESSION: 1. High-grade distal small bowel obstruction without evident etiology, suggesting adhesions. 2. Pelvic ascites.   Electronically Signed   By: DArne ClevelandM.D.   On: 03/18/2014 03:00    Anti-infectives: Anti-infectives   None      Assessment Principal Problem:   Small bowel obstruction-some clinical improvement Active Problems:   Multiple myeloma, without mention of having achieved remission(203.00)   Prostate cancer S/P prostatectomy 02/09/14     LOS: 1 day   Plan: Check x-rays.  Continue non-operative management.   Keyton Bhat J 03/19/2014

## 2014-03-19 NOTE — Progress Notes (Signed)
Patient is very irritated with the ng tube. He is requesting it to be removed "now", becoming beligerant. Will notify md.

## 2014-03-20 LAB — BASIC METABOLIC PANEL
ANION GAP: 11 (ref 5–15)
BUN: 8 mg/dL (ref 6–23)
CO2: 25 mEq/L (ref 19–32)
Calcium: 9.3 mg/dL (ref 8.4–10.5)
Chloride: 104 mEq/L (ref 96–112)
Creatinine, Ser: 0.79 mg/dL (ref 0.50–1.35)
GFR calc Af Amer: >90 mL/min (ref >90)
GFR calc non Af Amer: >90 mL/min (ref >90)
Glucose, Bld: 103 mg/dL — ABNORMAL HIGH (ref 70–99)
POTASSIUM: 4.1 meq/L (ref 3.7–5.3)
Sodium: 140 mEq/L (ref 137–147)

## 2014-03-20 MED ORDER — LOSARTAN POTASSIUM 50 MG PO TABS
50.0000 mg | ORAL_TABLET | Freq: Every day | ORAL | Status: DC
  Administered 2014-03-20 – 2014-03-21 (×2): 50 mg via ORAL
  Filled 2014-03-20 (×2): qty 1

## 2014-03-20 NOTE — Progress Notes (Signed)
Subjective: Bowels moving and passing gas.  Tolerated clear liquids with ng clamped.  Objective: Vital signs in last 24 hours: Temp:  [98.4 F (36.9 C)-98.9 F (37.2 C)] 98.4 F (36.9 C) (09/06 0655) Pulse Rate:  [66-78] 66 (09/06 0655) Resp:  [18-20] 20 (09/06 0655) BP: (147-155)/(85-99) 154/99 mmHg (09/06 0655) SpO2:  [100 %] 100 % (09/06 0655) Weight:  [151 lb 1.6 oz (68.539 kg)-151 lb 14.4 oz (68.901 kg)] 151 lb 1.6 oz (68.539 kg) (09/06 0500) Last BM Date: March 30, 2014  Intake/Output from previous day: March 31, 2023 0701 - 09/06 0700 In: 1280 [P.O.:1240] Out: 820 [Urine:420; Emesis/NG output:400] Intake/Output this shift:    PE: General- In NAD Abdomen-soft, non-tender, non-distended, few bowel sounds  Lab Results:   Recent Labs  03/18/14 0140 March 30, 2014 0520  WBC 5.4 4.4  HGB 13.0 12.2*  HCT 38.4* 36.2*  PLT 186 161   BMET  Recent Labs  03-30-14 0520 03/20/14 0541  NA 138 140  K 4.2 4.1  CL 102 104  CO2 23 25  GLUCOSE 86 103*  BUN 10 8  CREATININE 0.82 0.79  CALCIUM 9.0 9.3   PT/INR No results found for this basename: LABPROT, INR,  in the last 72 hours Comprehensive Metabolic Panel:    Component Value Date/Time   NA 140 03/20/2014 0541   NA 138 03/30/14 0520   NA 139 03/09/2014 0819   NA 140 01/21/2014 0759   K 4.1 03/20/2014 0541   K 4.2 Mar 30, 2014 0520   K 4.0 03/09/2014 0819   K 4.2 01/21/2014 0759   CL 104 03/20/2014 0541   CL 102 2014/03/30 0520   CL 105 01/01/2013 0835   CL 104 12/11/2012 0941   CO2 25 03/20/2014 0541   CO2 23 2014-03-30 0520   CO2 24 03/09/2014 0819   CO2 24 01/21/2014 0759   BUN 8 03/20/2014 0541   BUN 10 2014/03/30 0520   BUN 12.0 03/09/2014 0819   BUN 14.9 01/21/2014 0759   CREATININE 0.79 03/20/2014 0541   CREATININE 0.82 30-Mar-2014 0520   CREATININE 0.8 03/09/2014 0819   CREATININE 0.9 01/21/2014 0759   CREATININE (Revised) 0.92 07/24/2009 1449   GLUCOSE 103* 03/20/2014 0541   GLUCOSE 86 30-Mar-2014 0520   GLUCOSE 115 03/09/2014 0819   GLUCOSE  105 01/21/2014 0759   GLUCOSE 106* 01/01/2013 0835   GLUCOSE 95 12/11/2012 0941   CALCIUM 9.3 03/20/2014 0541   CALCIUM 9.0 03/30/2014 0520   CALCIUM 9.2 03/09/2014 0819   CALCIUM 9.1 01/21/2014 0759   AST 20 03/18/2014 0140   AST 21 01/21/2014 0759   AST 21 11/26/2013 0914   AST 31 10/23/2012 1350   ALT 25 03/18/2014 0140   ALT 22 01/21/2014 0759   ALT 22 11/26/2013 0914   ALT 48 10/23/2012 1350   ALKPHOS 80 03/18/2014 0140   ALKPHOS 82 01/21/2014 0759   ALKPHOS 90 11/26/2013 0914   ALKPHOS 51 10/23/2012 1350   BILITOT 0.3 03/18/2014 0140   BILITOT 0.45 01/21/2014 0759   BILITOT 0.56 11/26/2013 0914   BILITOT 0.3 10/23/2012 1350   PROT 7.4 03/18/2014 0140   PROT 6.8 01/21/2014 0759   PROT 7.0 11/26/2013 0914   PROT 9.5* 10/23/2012 1350   ALBUMIN 4.0 03/18/2014 0140   ALBUMIN 3.8 01/21/2014 0759   ALBUMIN 4.0 11/26/2013 0914   ALBUMIN 3.5 10/23/2012 1350     Studies/Results: Dg Abd 2 Views  Mar 30, 2014   CLINICAL DATA:  Small bowel obstruction.  EXAM: ABDOMEN - 2 VIEW  COMPARISON:  CT of the abdomen and pelvis on 03/18/2014  FINDINGS: Nasogastric tube extends into the stomach. No dilated small bowel loops are identified. Oral contrast ingested for CT is present in the colon. No free air.  IMPRESSION: Resolution of small bowel obstruction.   Electronically Signed   By: Aletta Edouard M.D.   On: 03/19/2014 08:27    Anti-infectives: Anti-infectives   None      Assessment Principal Problem:   Small bowel obstruction-clinically improving   Prostate cancer S/P prostatectomy      LOS: 2 days   Plan: Remove ng tube.  Full liquid diet.   Travis Nguyen J 03/20/2014

## 2014-03-20 NOTE — Progress Notes (Signed)
PROGRESS NOTE  Travis Nguyen PZW:[id number] DOB: 10-05-1950 DOA: 03/18/2014 PCP: [name], MD  Small bowel obstruction  -Secondary to adhesions  -Appreciate general surgery  -03/19/2014 2 view abdomen--resolution of small bowel obstruction-->clear liquids on 9/5  -03/20/2014--NG tube discontinued  -03/20/2014--full liquid diet -Encourage ambulation  -IV hydration  -pain control, anti-emetic  Hypertension  -When necessary hydralazine  -Restart losartan  Multiple myeloma  -Presently on maintenance therapy with Revlimid  -Unable to take by mouth presently  -spoke with Dr. [name] who was on call--do not restart Revlimid until he is d/ced and follows up with [name]  Prostate cancer  -Status post prostatectomy 02/09/2014  Family Communication: Pt at beside  Disposition Plan: Home 03/21/14 if tolerates soft diet          Procedures/Studies: Ct Abdomen Pelvis W Contrast  03/18/2014   CLINICAL DATA:  r/o infection Hx recent surgery  EXAM: CT ABDOMEN AND PELVIS WITH CONTRAST  TECHNIQUE: Multidetector CT imaging of the abdomen and pelvis was performed using the standard protocol following bolus administration of intravenous contrast.  CONTRAST:  190m OMNIPAQUE IOHEXOL 300 MG/ML  SOLN  COMPARISON:  Ultrasound 07/31/2009 and earlier studies  FINDINGS: Visualized lung bases clear. Central venous catheter tip in the distal SVC. 3.8 cm subcapsular lesion in hepatic segment 7 corresponding to hemangioma described on previous ultrasound examinations. Lesion demonstrates peripheral puddling on the initial scan with later fill-in on delayed images. There is an 8 mm low-attenuation subcapsular lesion in segment 5 image 40/2, probably cyst but too small to accurately characterize. Unremarkable spleen, adrenal glands, pancreas, left kidney. 12 mm cyst in the upper pole right kidney. No hydronephrosis.  Stomach is distended by ingested material. There is incomplete distal passage  of oral contrast material. Multiple dilated mid and distal small bowel loops. The terminal ileum and distal loops of small bowel are decompressed but the exact transition point is difficult to pinpoint. There is no mass or abscess identified. No hernia. The colon is decompressed. Appendix not identified. Urinary bladder physiologically distended. There is a small amount of pelvic ascites. Normal vascular enhancement. No adenopathy localized. No free air. Advanced degenerative changes in bilateral hips. Degenerative disc disease L5-S1.  IMPRESSION: 1. High-grade distal small bowel obstruction without evident etiology, suggesting adhesions. 2. Pelvic ascites.   Electronically Signed   By: DArne ClevelandM.D.   On: 03/18/2014 03:00   Dg Abd 2 Views  03/19/2014   CLINICAL DATA:  Small bowel obstruction.  EXAM: ABDOMEN - 2 VIEW  COMPARISON:  CT of the abdomen and pelvis on 03/18/2014  FINDINGS: Nasogastric tube extends into the stomach. No dilated small bowel loops are identified. Oral contrast ingested for CT is present in the colon. No free air.  IMPRESSION: Resolution of small bowel obstruction.   Electronically Signed   By: GAletta EdouardM.D.   On: 03/19/2014 08:27         Subjective:  patient feels significantly better with the NG tube out. He denies any fevers, chills, chest pain, shortness breath, nausea, vomiting, diarrhea. He is on multiple bowel movements. Abdominal pain has improved.  Objective: Filed Vitals:   03/19/14 2150 03/20/14 0500 03/20/14 0655 03/20/14 1337  BP: 147/85  154/99 138/76  Pulse: 78  66 76  Temp: 98.9 F (37.2 C)  98.4 F (36.9 C) 98.7 F (37.1 C)  TempSrc: Oral  Oral Oral  Resp: 18  20 18   Height:  Weight:  68.539 kg (151 lb 1.6 oz)    SpO2: 100%  100% 100%    Intake/Output Summary (Last 24 hours) at 03/20/14 1738 Last data filed at 03/20/14 1239  Gross per 24 hour  Intake   1760 ml  Output    300 ml  Net   1460 ml   Weight change:   Exam:   General:  Pt is alert, follows commands appropriately, not in acute distress  HEENT: No icterus, No thrush,  [place]/AT  Cardiovascular: RRR, S1/S2, no rubs, no gallops  Respiratory: CTA bilaterally, no wheezing, no crackles, no rhonchi  Abdomen: Soft/+BS, non tender, mild periumbilical tenderness without peritoneal signs or rebound  no guarding  Extremities: No edema, No lymphangitis, No petechiae, No rashes, no synovitis  Data Reviewed: Basic Metabolic Panel:  Recent Labs Lab 03/18/14 0140 03/19/14 0520 03/20/14 0541  NA 138 138 140  K 4.5 4.2 4.1  CL 100 102 104  CO2 27 23 25   GLUCOSE 100* 86 103*  BUN 11 10 8   CREATININE 0.90 0.82 0.79  CALCIUM 9.8 9.0 9.3   Liver Function Tests:  Recent Labs Lab 03/18/14 0140  AST 20  ALT 25  ALKPHOS 80  BILITOT 0.3  PROT 7.4  ALBUMIN 4.0   No results found for this basename: LIPASE, AMYLASE,  in the last 168 hours No results found for this basename: AMMONIA,  in the last 168 hours CBC:  Recent Labs Lab 03/18/14 0140 03/19/14 0520  WBC 5.4 4.4  NEUTROABS 3.1  --   HGB 13.0 12.2*  HCT 38.4* 36.2*  MCV 91.2 94.0  PLT 186 161   Cardiac Enzymes: No results found for this basename: CKTOTAL, CKMB, CKMBINDEX, TROPONINI,  in the last 168 hours BNP: No components found with this basename: POCBNP,  CBG: No results found for this basename: GLUCAP,  in the last 168 hours  Recent Results (from the past 240 hour(s))  URINE CULTURE     Status: None   Collection Time    03/18/14  1:49 AM      Result Value Ref Range Status   Specimen Description URINE, CLEAN CATCH   Final   Special Requests NONE   Final   Culture  Setup Time     Final   Value: 03/18/2014 09:58     Performed at [facility]     Final   Value: NO GROWTH     Performed at [facility]   Culture     Final   Value: NO GROWTH     Performed at [facility]   Report Status 03/19/2014 FINAL   Final      Scheduled Meds: . lip balm  1 application Topical BID  . losartan  50 mg Oral Daily   Continuous Infusions:    Donata Reddick, DO  [facility] Pager [phone]  If 7PM-7AM, please contact night-coverage [email or web address] Password [email or web address] 03/20/2014, 5:38 PM   LOS: 2 days

## 2014-03-20 NOTE — Progress Notes (Signed)
Tape reinforced on nose to secure Ng which is plugged,c/o sore throat,oral care and antiseptic spray,Tol CL well,hypo bowell sounds,+ flatus and had loose green/brown stool earlier today.Wants NG tube out asap Daylene Posey

## 2014-03-20 NOTE — Discharge Summary (Signed)
Physician Discharge Summary  Travis Nguyen WUJ:[id number] DOB: [birth year] DOA: 03/18/2014  PCP: Travis Kroner, MD  Admit date: 03/18/2014 Discharge date: 03/21/14 Recommendations for Outpatient Follow-up:  1. Pt will need to follow up with PCP in 2 weeks post discharge 2. Dr. Alen Nguyen in 1-2 weeks    Discharge Diagnoses:  Small bowel obstruction  -Secondary to adhesions  -Appreciate general surgery  -03/19/2014 2 view abdomen--resolution of small bowel obstruction-->clear liquids on 9/5  -03/20/2014--NG tube discontinued  -03/20/2014--full liquid diet -03/21/14--soft diet started which patient tolerated  -Encourage ambulation  -IV hydration  -pain control, anti-emetic  Hypertension  -When necessary hydralazine  -Restart losartan  Multiple myeloma  -Presently on maintenance therapy with Revlimid  -Unable to take by mouth presently  -spoke with Dr. [name] who was on call--do not restart Revlimid until he is d/ced and follows up with Dr. Alen Nguyen  Prostate cancer  -Status post prostatectomy 02/09/2014     Discharge Condition: stable  Disposition:  Follow-up Information   Follow up with Travis Kroner, MD In 1 week.   Specialty:  Family Medicine   Contact information:   [address], [place] [address] [phone]       Follow up with [facility], MD In 1 week.   Specialty:  Oncology   Contact information:   [place] [place] [id number] [phone]     home  Diet:soft Wt Readings from Last 3 Encounters:  03/21/14 67.223 kg (148 lb 3.2 oz)  03/09/14 71.215 kg (157 lb)  02/09/14 73.029 kg (161 lb)    History of present illness:  63 yo male 14 days s/p prostectomy for prostate cancer, h/o multiple myeloma comes in with 2 days of worsening lower abdominal pain with associated nausea without vomiting. Pt is on chemo for his MM and it makes him constipated, and ever since his surgery the constipation has worsened, so he has been taking  mirilax and has had several runny loose stools. But the abd pain has gotten worse. As a result he came to the emergency department. CT of the abdomen and pelvis revealed a high-grade distal small bowel obstruction. General surgery was consulted. NG tube was inserted to suction. The patient was made n.p.o. and treated conservatively with pain medications, IV fluids, antiemetics, and ambulation. Fortunately, the patient improved clinically. Followup abdominal x-rays revealed resolution of the small bowel obstruction. The patient began to pass flatus and had bowel movements. His abdominal pain continued to improve. His diet was gradually advanced to a soft diet which he tolerated. The patient was restarted back on his antihypertensive medication. Then the hospitalization, the patient's Revlimid was placed on hold. The case was discussed with medical oncology on call, who stated that the patient should not restart the medication until after discharge and instructed to do so by Dr. Alen Nguyen     Consultants: General Surgery  Discharge Exam: Filed Vitals:   03/21/14 1410  BP: 136/75  Pulse: 61  Temp: 98.7 F (37.1 C)  Resp: 18   Filed Vitals:   03/20/14 2101 03/21/14 0441 03/21/14 0618 03/21/14 1410  BP: 140/74 136/72  136/75  Pulse: 59 67  61  Temp: 99.3 F (37.4 C) 99 F (37.2 C)  98.7 F (37.1 C)  TempSrc: Oral Oral  Oral  Resp: 18 16  18   Height:      Weight:   67.223 kg (148 lb 3.2 oz)   SpO2: 100% 100%  100%   General: A&O x 3,  NAD, pleasant, cooperative Cardiovascular: RRR, no rub, no gallop, no S3 Respiratory: CTAB, no wheeze, no rhonchi Abdomen:soft, nontender, nondistended, positive bowel sounds Extremities: No edema, No lymphangitis, no petechiae  Discharge Instructions      Discharge Instructions   Diet - low sodium heart healthy    Complete by:  As directed      Discharge instructions    Complete by:  As directed   Do NOT restart Revlimid until you are instructed to  do so by Dr. Alen Nguyen. Restart your blood pressure medication on 03/22/14.     Increase activity slowly    Complete by:  As directed             Medication List         lenalidomide 10 MG capsule  Commonly known as:  REVLIMID  Take 1 capsule (10 mg total) by mouth daily for the next 21 days then rest for 7 days and repeat.     losartan 100 MG tablet  Commonly known as:  COZAAR  Take 100 mg by mouth every morning.     oxycodone 30 MG immediate release tablet  Commonly known as:  ROXICODONE  Take 1 tablet (30 mg total) by mouth every 4 (four) hours as needed for pain.         The results of significant diagnostics from this hospitalization (including imaging, microbiology, ancillary and laboratory) are listed below for reference.    Significant Diagnostic Studies: Ct Abdomen Pelvis W Contrast  03/18/2014   CLINICAL DATA:  r/o infection Hx recent surgery  EXAM: CT ABDOMEN AND PELVIS WITH CONTRAST  TECHNIQUE: Multidetector CT imaging of the abdomen and pelvis was performed using the standard protocol following bolus administration of intravenous contrast.  CONTRAST:  152m OMNIPAQUE IOHEXOL 300 MG/ML  SOLN  COMPARISON:  Ultrasound 07/31/2009 and earlier studies  FINDINGS: Visualized lung bases clear. Central venous catheter tip in the distal SVC. 3.8 cm subcapsular lesion in hepatic segment 7 corresponding to hemangioma described on previous ultrasound examinations. Lesion demonstrates peripheral puddling on the initial scan with later fill-in on delayed images. There is an 8 mm low-attenuation subcapsular lesion in segment 5 image 40/2, probably cyst but too small to accurately characterize. Unremarkable spleen, adrenal glands, pancreas, left kidney. 12 mm cyst in the upper pole right kidney. No hydronephrosis.  Stomach is distended by ingested material. There is incomplete distal passage of oral contrast material. Multiple dilated mid and distal small bowel loops. The terminal ileum and  distal loops of small bowel are decompressed but the exact transition point is difficult to pinpoint. There is no mass or abscess identified. No hernia. The colon is decompressed. Appendix not identified. Urinary bladder physiologically distended. There is a small amount of pelvic ascites. Normal vascular enhancement. No adenopathy localized. No free air. Advanced degenerative changes in bilateral hips. Degenerative disc disease L5-S1.  IMPRESSION: 1. High-grade distal small bowel obstruction without evident etiology, suggesting adhesions. 2. Pelvic ascites.   Electronically Signed   By: DArne ClevelandM.D.   On: 03/18/2014 03:00   Dg Abd 2 Views  03/19/2014   CLINICAL DATA:  Small bowel obstruction.  EXAM: ABDOMEN - 2 VIEW  COMPARISON:  CT of the abdomen and pelvis on 03/18/2014  FINDINGS: Nasogastric tube extends into the stomach. No dilated small bowel loops are identified. Oral contrast ingested for CT is present in the colon. No free air.  IMPRESSION: Resolution of small bowel obstruction.   Electronically Signed   By: GEulas Post  Kathlene Cote M.D.   On: 03/19/2014 08:27     Microbiology: Recent Results (from the past 240 hour(s))  URINE CULTURE     Status: None   Collection Time    03/18/14  1:49 AM      Result Value Ref Range Status   Specimen Description URINE, CLEAN CATCH   Final   Special Requests NONE   Final   Culture  Setup Time     Final   Value: 03/18/2014 09:58     Performed at [facility]     Final   Value: NO GROWTH     Performed at [facility]   Culture     Final   Value: NO GROWTH     Performed at [facility]   Report Status 03/19/2014 FINAL   Final     Labs: Basic Metabolic Panel:  Recent Labs Lab 03/18/14 0140 03/19/14 0520 03/20/14 0541 03/21/14 0520  NA 138 138 140 139  K 4.5 4.2 4.1 4.1  CL 100 102 104 101  CO2 27 23 25 26   GLUCOSE 100* 86 103* 94  BUN 11 10 8 6   CREATININE 0.90 0.82 0.79 0.80  CALCIUM 9.8 9.0 9.3  9.4   Liver Function Tests:  Recent Labs Lab 03/18/14 0140  AST 20  ALT 25  ALKPHOS 80  BILITOT 0.3  PROT 7.4  ALBUMIN 4.0   No results found for this basename: LIPASE, AMYLASE,  in the last 168 hours No results found for this basename: AMMONIA,  in the last 168 hours CBC:  Recent Labs Lab 03/18/14 0140 03/19/14 0520 03/21/14 0520  WBC 5.4 4.4 3.3*  NEUTROABS 3.1  --   --   HGB 13.0 12.2* 11.4*  HCT 38.4* 36.2* 34.7*  MCV 91.2 94.0 93.3  PLT 186 161 155   Cardiac Enzymes: No results found for this basename: CKTOTAL, CKMB, CKMBINDEX, TROPONINI,  in the last 168 hours BNP: No components found with this basename: POCBNP,  CBG: No results found for this basename: GLUCAP,  in the last 168 hours  Time coordinating discharge:  Greater than 30 minutes  Signed:  Mccabe Gloria, DO [facility] Pager: [phone] 03/21/2014, 2:31 PM

## 2014-03-21 LAB — CBC
HEMATOCRIT: 34.7 % — AB (ref 39.0–52.0)
Hemoglobin: 11.4 g/dL — ABNORMAL LOW (ref 13.0–17.0)
MCH: 30.6 pg (ref 26.0–34.0)
MCHC: 32.9 g/dL (ref 30.0–36.0)
MCV: 93.3 fL (ref 78.0–100.0)
Platelets: 155 10*3/uL (ref 150–400)
RBC: 3.72 MIL/uL — AB (ref 4.22–5.81)
RDW: 13.5 % (ref 11.5–15.5)
WBC: 3.3 10*3/uL — AB (ref 4.0–10.5)

## 2014-03-21 LAB — BASIC METABOLIC PANEL
Anion gap: 12 (ref 5–15)
BUN: 6 mg/dL (ref 6–23)
CALCIUM: 9.4 mg/dL (ref 8.4–10.5)
CO2: 26 meq/L (ref 19–32)
Chloride: 101 mEq/L (ref 96–112)
Creatinine, Ser: 0.8 mg/dL (ref 0.50–1.35)
GFR calc Af Amer: >90 mL/min (ref >90)
GFR calc non Af Amer: >90 mL/min (ref >90)
Glucose, Bld: 94 mg/dL (ref 70–99)
POTASSIUM: 4.1 meq/L (ref 3.7–5.3)
SODIUM: 139 meq/L (ref 137–147)

## 2014-03-21 NOTE — Progress Notes (Signed)
Subjective: Tolerating diet.  Bowels moving.  Objective: Vital signs in last 24 hours: Temp:  [98.7 F (37.1 C)-99.3 F (37.4 C)] 99 F (37.2 C) (09/07 0441) Pulse Rate:  [59-76] 67 (09/07 0441) Resp:  [16-18] 16 (09/07 0441) BP: (136-140)/(72-76) 136/72 mmHg (09/07 0441) SpO2:  [100 %] 100 % (09/07 0441) Weight:  [148 lb 3.2 oz (67.223 kg)] 148 lb 3.2 oz (67.223 kg) (09/07 0618) Last BM Date: 03/20/14  Intake/Output from previous day: 09/06 0701 - 09/07 0700 In: 2040 [P.O.:2040] Out: 1625 [Urine:1625] Intake/Output this shift:    PE: General- In NAD Abdomen-soft, non-tender, non-distended  Lab Results:   Recent Labs  2014-04-06 0520 03/21/14 0520  WBC 4.4 3.3*  HGB 12.2* 11.4*  HCT 36.2* 34.7*  PLT 161 155   BMET  Recent Labs  03/20/14 0541 03/21/14 0520  NA 140 139  K 4.1 4.1  CL 104 101  CO2 25 26  GLUCOSE 103* 94  BUN 8 6  CREATININE 0.79 0.80  CALCIUM 9.3 9.4   PT/INR No results found for this basename: LABPROT, INR,  in the last 72 hours Comprehensive Metabolic Panel:    Component Value Date/Time   NA 139 03/21/2014 0520   NA 140 03/20/2014 0541   NA 139 03/09/2014 0819   NA 140 01/21/2014 0759   K 4.1 03/21/2014 0520   K 4.1 03/20/2014 0541   K 4.0 03/09/2014 0819   K 4.2 01/21/2014 0759   CL 101 03/21/2014 0520   CL 104 03/20/2014 0541   CL 105 01/01/2013 0835   CL 104 12/11/2012 0941   CO2 26 03/21/2014 0520   CO2 25 03/20/2014 0541   CO2 24 03/09/2014 0819   CO2 24 01/21/2014 0759   BUN 6 03/21/2014 0520   BUN 8 03/20/2014 0541   BUN 12.0 03/09/2014 0819   BUN 14.9 01/21/2014 0759   CREATININE 0.80 03/21/2014 0520   CREATININE 0.79 03/20/2014 0541   CREATININE 0.8 03/09/2014 0819   CREATININE 0.9 01/21/2014 0759   CREATININE (Revised) 0.92 07/24/2009 1449   GLUCOSE 94 03/21/2014 0520   GLUCOSE 103* 03/20/2014 0541   GLUCOSE 115 03/09/2014 0819   GLUCOSE 105 01/21/2014 0759   GLUCOSE 106* 01/01/2013 0835   GLUCOSE 95 12/11/2012 0941   CALCIUM 9.4 03/21/2014 0520    CALCIUM 9.3 03/20/2014 0541   CALCIUM 9.2 03/09/2014 0819   CALCIUM 9.1 01/21/2014 0759   AST 20 03/18/2014 0140   AST 21 01/21/2014 0759   AST 21 11/26/2013 0914   AST 31 10/23/2012 1350   ALT 25 03/18/2014 0140   ALT 22 01/21/2014 0759   ALT 22 11/26/2013 0914   ALT 48 10/23/2012 1350   ALKPHOS 80 03/18/2014 0140   ALKPHOS 82 01/21/2014 0759   ALKPHOS 90 11/26/2013 0914   ALKPHOS 51 10/23/2012 1350   BILITOT 0.3 03/18/2014 0140   BILITOT 0.45 01/21/2014 0759   BILITOT 0.56 11/26/2013 0914   BILITOT 0.3 10/23/2012 1350   PROT 7.4 03/18/2014 0140   PROT 6.8 01/21/2014 0759   PROT 7.0 11/26/2013 0914   PROT 9.5* 10/23/2012 1350   ALBUMIN 4.0 03/18/2014 0140   ALBUMIN 3.8 01/21/2014 0759   ALBUMIN 4.0 11/26/2013 0914   ALBUMIN 3.5 10/23/2012 1350     Studies/Results: Dg Abd 2 Views  04-06-2014   CLINICAL DATA:  Small bowel obstruction.  EXAM: ABDOMEN - 2 VIEW  COMPARISON:  CT of the abdomen and pelvis on 03/18/2014  FINDINGS: Nasogastric tube extends into  the stomach. No dilated small bowel loops are identified. Oral contrast ingested for CT is present in the colon. No free air.  IMPRESSION: Resolution of small bowel obstruction.   Electronically Signed   By: Aletta Edouard M.D.   On: 03/19/2014 08:27    Anti-infectives: Anti-infectives   None      Assessment Principal Problem:   Small bowel obstruction-resolved clinically   Prostate cancer S/P prostatectomy      LOS: 3 days   Plan: Advance to soft diet.  May go home this afternoon from our standpoint if diet tolerated.  Follow up with [facility] prn.   Travis Nguyen 03/21/2014

## 2014-03-21 NOTE — Progress Notes (Signed)
Patient discharge instructions reviewed. Patient verbalizes understanding. Wife is here and will be ride home.

## 2014-03-21 NOTE — ED Provider Notes (Signed)
Medical screening examination/treatment/procedure(s) were performed by non-physician practitioner and as supervising physician I was immediately available for consultation/collaboration.   EKG Interpretation None       Virgel Manifold, MD 03/21/14 [phone]

## 2014-03-22 ENCOUNTER — Telehealth: Payer: Self-pay | Admitting: Medical Oncology

## 2014-03-22 ENCOUNTER — Telehealth: Payer: Self-pay | Admitting: *Deleted

## 2014-03-22 NOTE — Telephone Encounter (Signed)
He is to resume Revlimid and keep his appointment on 10/6.

## 2014-03-22 NOTE — Telephone Encounter (Signed)
LATE ENTRY- PT. ALSO CALLED DR.[name]'S NURSE, MIRA, FROM THIS OFFICE'S LOBBY CONCERNING HIS APPOINTMENT AND STARTING HIS REVLIMID. PER DR.[name]- MIRA ANSWERED PT.'S QUESTIONS.

## 2014-03-22 NOTE — Telephone Encounter (Signed)
Return call to patient and informed him, per MD, to resume his revlimid and to keep his appt 10/06. Patient expressed understanding, and knows to call office/clinic with further questions or concerns.

## 2014-03-22 NOTE — Telephone Encounter (Signed)
Patient called requesting f/u appt d/t recent hospitalization for SBO and discharge.  Patient does have 10/06 appts for lab/MD/tx   Message routed to MD for review.

## 2014-03-23 ENCOUNTER — Other Ambulatory Visit: Payer: Self-pay | Admitting: *Deleted

## 2014-03-23 DIAGNOSIS — C61 Malignant neoplasm of prostate: Secondary | ICD-10-CM

## 2014-03-23 MED ORDER — LENALIDOMIDE 10 MG PO CAPS: ORAL_CAPSULE | ORAL | Status: DC

## 2014-03-23 NOTE — Telephone Encounter (Signed)
Called patient advising to take [employer] patient survey for this Revlimid refill.  Message left on [phone].

## 2014-04-01 ENCOUNTER — Emergency Department (HOSPITAL_COMMUNITY)
Admission: EM | Admit: 2014-04-01 | Discharge: 2014-04-01 | Disposition: A | Discharge status: Home / Self Care | Payer: No Typology Code available for payment source | Attending: Emergency Medicine | Admitting: Emergency Medicine

## 2014-04-01 ENCOUNTER — Emergency Department (HOSPITAL_COMMUNITY): Payer: No Typology Code available for payment source

## 2014-04-01 ENCOUNTER — Encounter (HOSPITAL_COMMUNITY): Payer: Self-pay | Admitting: Emergency Medicine

## 2014-04-01 DIAGNOSIS — Z85820 Personal history of malignant melanoma of skin: Secondary | ICD-10-CM | POA: Diagnosis not present

## 2014-04-01 DIAGNOSIS — R011 Cardiac murmur, unspecified: Secondary | ICD-10-CM | POA: Diagnosis not present

## 2014-04-01 DIAGNOSIS — Z8546 Personal history of malignant neoplasm of prostate: Secondary | ICD-10-CM | POA: Insufficient documentation

## 2014-04-01 DIAGNOSIS — Z8709 Personal history of other diseases of the respiratory system: Secondary | ICD-10-CM | POA: Diagnosis not present

## 2014-04-01 DIAGNOSIS — I10 Essential (primary) hypertension: Secondary | ICD-10-CM | POA: Diagnosis not present

## 2014-04-01 DIAGNOSIS — Z862 Personal history of diseases of the blood and blood-forming organs and certain disorders involving the immune mechanism: Secondary | ICD-10-CM | POA: Insufficient documentation

## 2014-04-01 DIAGNOSIS — Z87891 Personal history of nicotine dependence: Secondary | ICD-10-CM | POA: Insufficient documentation

## 2014-04-01 DIAGNOSIS — Z8739 Personal history of other diseases of the musculoskeletal system and connective tissue: Secondary | ICD-10-CM | POA: Insufficient documentation

## 2014-04-01 DIAGNOSIS — Z79899 Other long term (current) drug therapy: Secondary | ICD-10-CM | POA: Insufficient documentation

## 2014-04-01 DIAGNOSIS — Z8701 Personal history of pneumonia (recurrent): Secondary | ICD-10-CM | POA: Insufficient documentation

## 2014-04-01 DIAGNOSIS — K5900 Constipation, unspecified: Secondary | ICD-10-CM | POA: Insufficient documentation
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
## 2014-04-01 NOTE — Discharge Instructions (Signed)
Drink plenty of fluids. Get adequate fiber in diet.  Take colace (stool softener) 2x/day.  Take miralax (laxative) as need.  Both of these medications are available over the counter. Follow up with your doctor in the next few days for recheck if symptoms fail to improve/resolve. Return to ER if worse, new symptoms, fevers, vomiting, abdominal pain and distension, other concern.     Constipation Constipation is when a person has fewer than three bowel movements a week, has difficulty having a bowel movement, or has stools that are dry, hard, or larger than normal. As people grow older, constipation is more common. If you try to fix constipation with medicines that make you have a bowel movement (laxatives), the problem may get worse. Long-term laxative use may cause the muscles of the colon to become weak. A low-fiber diet, not taking in enough fluids, and taking certain medicines may make constipation worse.  CAUSES   Certain medicines, such as antidepressants, pain medicine, iron supplements, antacids, and water pills.   Certain diseases, such as diabetes, irritable bowel syndrome (IBS), thyroid disease, or depression.   Not drinking enough water.   Not eating enough fiber-rich foods.   Stress or travel.   Lack of physical activity or exercise.   Ignoring the urge to have a bowel movement.   Using laxatives too much.  SIGNS AND SYMPTOMS   Having fewer than three bowel movements a week.   Straining to have a bowel movement.   Having stools that are hard, dry, or larger than normal.   Feeling full or bloated.   Pain in the lower abdomen.   Not feeling relief after having a bowel movement.  DIAGNOSIS  Your health care provider will take a medical history and perform a physical exam. Further testing may be done for severe constipation. Some tests may include:  A barium enema X-ray to examine your rectum, colon, and, sometimes, your small intestine.   A  sigmoidoscopy to examine your lower colon.   A colonoscopy to examine your entire colon. TREATMENT  Treatment will depend on the severity of your constipation and what is causing it. Some dietary treatments include drinking more fluids and eating more fiber-rich foods. Lifestyle treatments may include regular exercise. If these diet and lifestyle recommendations do not help, your health care provider may recommend taking over-the-counter laxative medicines to help you have bowel movements. Prescription medicines may be prescribed if over-the-counter medicines do not work.  HOME CARE INSTRUCTIONS   Eat foods that have a lot of fiber, such as fruits, vegetables, whole grains, and beans.  Limit foods high in fat and processed sugars, such as french fries, hamburgers, cookies, candies, and soda.   A fiber supplement may be added to your diet if you cannot get enough fiber from foods.   Drink enough fluids to keep your urine clear or pale yellow.   Exercise regularly or as directed by your health care provider.   Go to the restroom when you have the urge to go. Do not hold it.   Only take over-the-counter or prescription medicines as directed by your health care provider. Do not take other medicines for constipation without talking to your health care provider first.  [place] IF:   You have bright red blood in your stool.   Your constipation lasts for more than 4 days or gets worse.   You have abdominal or rectal pain.   You have thin, pencil-like stools.   You have unexplained  weight loss. MAKE SURE YOU:   Understand these instructions.  Will watch your condition.  Will get help right away if you are not doing well or get worse. Document Released: 03/29/2004 Document Revised: 07/06/2013 Document Reviewed: 04/12/2013 [facility] Patient Information 2015 [place], [place]. This information is not intended to replace advice given to you by your health care  provider. Make sure you discuss any questions you have with your health care provider.

## 2014-04-01 NOTE — ED Notes (Signed)
Pt states he was recently released about 2 weeks for bowel obstruction and now having constipation, miralax not working, Last BM 2 days ago. Pt c/o headache and High BP, BP WNL in triage. Denies headache at now, states HTN and headache was yesterday.

## 2014-04-01 NOTE — ED Notes (Signed)
Pt states Miralax not helping him; no bowel movement x 2 days; no abd pain; had bowel "shrinkage" two weeks ago and is concerned this will happen again; denies headache this morning and states his blood pressure is under control now;

## 2014-04-19 ENCOUNTER — Other Ambulatory Visit (HOSPITAL_BASED_OUTPATIENT_CLINIC_OR_DEPARTMENT_OTHER): Payer: No Typology Code available for payment source

## 2014-04-19 ENCOUNTER — Ambulatory Visit (HOSPITAL_BASED_OUTPATIENT_CLINIC_OR_DEPARTMENT_OTHER): Payer: No Typology Code available for payment source | Admitting: Oncology

## 2014-04-19 ENCOUNTER — Ambulatory Visit (HOSPITAL_BASED_OUTPATIENT_CLINIC_OR_DEPARTMENT_OTHER): Payer: No Typology Code available for payment source

## 2014-04-19 ENCOUNTER — Encounter: Payer: Self-pay | Admitting: Oncology

## 2014-04-19 ENCOUNTER — Telehealth: Payer: Self-pay | Admitting: *Deleted

## 2014-04-19 ENCOUNTER — Telehealth: Payer: Self-pay | Admitting: Oncology

## 2014-04-19 VITALS — BP 128/69 | HR 59 | Temp 98.4°F | Resp 18 | Ht 73.0 in | Wt 158.1 lb

## 2014-04-19 DIAGNOSIS — C61 Malignant neoplasm of prostate: Secondary | ICD-10-CM

## 2014-04-19 DIAGNOSIS — C9000 Multiple myeloma not having achieved remission: Secondary | ICD-10-CM

## 2014-04-19 DIAGNOSIS — Z95828 Presence of other vascular implants and grafts: Secondary | ICD-10-CM

## 2014-04-19 LAB — COMPREHENSIVE METABOLIC PANEL (CC13)
ALBUMIN: 3.6 g/dL (ref 3.5–5.0)
ALT: 24 U/L (ref 0–55)
AST: 19 U/L (ref 5–34)
Alkaline Phosphatase: 86 U/L (ref 40–150)
Anion Gap: 5 mEq/L (ref 3–11)
BUN: 10.9 mg/dL (ref 7.0–26.0)
CALCIUM: 9.1 mg/dL (ref 8.4–10.4)
CHLORIDE: 106 meq/L (ref 98–109)
CO2: 27 mEq/L (ref 22–29)
Creatinine: 0.8 mg/dL (ref 0.7–1.3)
GLUCOSE: 98 mg/dL (ref 70–140)
POTASSIUM: 4 meq/L (ref 3.5–5.1)
Sodium: 139 mEq/L (ref 136–145)
Total Bilirubin: 0.29 mg/dL (ref 0.20–1.20)
Total Protein: 6.7 g/dL (ref 6.4–8.3)

## 2014-04-19 LAB — CBC WITH DIFFERENTIAL/PLATELET
BASO%: 1 % (ref 0.0–2.0)
BASOS ABS: 0 10*3/uL (ref 0.0–0.1)
EOS ABS: 0.1 10*3/uL (ref 0.0–0.5)
EOS%: 1.9 % (ref 0.0–7.0)
HCT: 37.1 % — ABNORMAL LOW (ref 38.4–49.9)
HEMOGLOBIN: 12.1 g/dL — AB (ref 13.0–17.1)
LYMPH#: 1.2 10*3/uL (ref 0.9–3.3)
LYMPH%: 41.7 % (ref 14.0–49.0)
MCH: 30.9 pg (ref 27.2–33.4)
MCHC: 32.6 g/dL (ref 32.0–36.0)
MCV: 94.8 fL (ref 79.3–98.0)
MONO#: 0.4 10*3/uL (ref 0.1–0.9)
MONO%: 12.8 % (ref 0.0–14.0)
NEUT%: 42.6 % (ref 39.0–75.0)
NEUTROS ABS: 1.3 10*3/uL — AB (ref 1.5–6.5)
Platelets: 181 10*3/uL (ref 140–400)
RBC: 3.91 10*6/uL — ABNORMAL LOW (ref 4.20–5.82)
RDW: 14 % (ref 11.0–14.6)
WBC: 3 10*3/uL — ABNORMAL LOW (ref 4.0–10.3)

## 2014-04-19 MED ORDER — SODIUM CHLORIDE 0.9 % IJ SOLN
10.0000 mL | INTRAMUSCULAR | Status: DC | PRN
  Administered 2014-04-19: 10 mL via INTRAVENOUS
  Filled 2014-04-19: qty 10

## 2014-04-19 MED ORDER — HEPARIN SOD (PORK) LOCK FLUSH 100 UNIT/ML IV SOLN
500.0000 [IU] | Freq: Once | INTRAVENOUS | Status: AC
  Administered 2014-04-19: 500 [IU] via INTRAVENOUS
  Filled 2014-04-19: qty 5

## 2014-04-19 NOTE — Progress Notes (Signed)
Hematology and Oncology Follow Up Visit  Travis Nguyen [id number] 05/29/1951 63 y.o. 04/19/2014 9:44 AM [name],[name], MDSwayne, Leonie Green, MD   Principle Diagnosis: 63 year old gentleman with the following diagnoses:  1. 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 Initially at smoldering subtype of 11% plasma cell in the bone marrow. He subsequently developed active multiple myeloma in February of 2014 with a 23% plasma cell infiltration. 2. Prostate cancer diagnosed in May of 2014. He presented with an elevated PSA of 13.2 stage TI C. clinical staging. His Gleason score was 3+4 equals 7.  Prior Therapy: He was treated initially with Cytoxan, Velcade and Decadron and subsequently his regimen changed to a Velcade, Revlimid with dexamethasone. He had an excellent response with that an M spike that is no longer detected an IgG level within normal range at April 2015. Of note, he did not have any poor prognostic features on his cytogenetics.   He is status post a robotic-assisted laparoscopic radical prostatectomy and bilateral lymph node dissection on 02/09/2014. The final pathology showed prostate adenocarcinoma Gleason score 4+3 equals 7 involving both lobes. Extraprostatic extension was present. The bladder neck margin was negative. Zero out of two lymph nodes were involved.  Current therapy: He is currently on maintenance Revlimid of 10 mg daily. He is awaiting consolidative autologous stem cell transplant  Interim History:  Travis Nguyen presents today for a followup visit. Since the last visit, he was hospitalized for small bowel obstruction likely related to adhesions. This was resolved spontaneously and was discharged on 03/21/2014. He continues to have issues with constipation related to Revlimid. And he has not taken it consistently since his recent discharge. He still has some incontinence issue that has improved. He did not report any fevers or chills or sweats. Does not  report any hematuria or dysuria. Does not report any headaches or blurry vision or double vision. Does not report any syncope or seizures. He does not report any skeletal complaints of arthralgias or myalgias. He does not report any chest pain, shortness of breath, palpitation orthopnea or PND. He does not report any shortness of breath or wheezing. His performance status and activity level remains excellent. Rest of his review of systems unremarkable.  Medications: I have reviewed the patient's current medications.  Current Outpatient Prescriptions  Medication Sig Dispense Refill  . losartan (COZAAR) 100 MG tablet Take 100 mg by mouth every morning.       [name] oxycodone (ROXICODONE) 30 MG immediate release tablet Take 30 mg by mouth every 6 (six) hours as needed for pain (back pain).       No current facility-administered medications for this visit.     Allergies: No Known Allergies  Past Medical History, Surgical history, Social history, and Family History were reviewed and updated.   Physical Exam: Blood pressure 128/69, pulse 59, temperature 98.4 F (36.9 C), temperature source Oral, resp. rate 18, height 6' 1"  (1.854 m), weight 158 lb 1.6 oz (71.714 kg). ECOG: 1 General appearance: alert and cooperative Head: Normocephalic, without obvious abnormality Neck: no adenopathy Lymph nodes: Cervical, supraclavicular, and axillary nodes normal. Heart:regular rate and rhythm, S1, S2 normal, no murmur, click, rub or gallop Lung:chest clear, no wheezing, rales, normal symmetric air entry Abdomin: soft, non-tender, without masses or organomegaly EXT:no erythema, induration, or nodules   Lab Results: Lab Results  Component Value Date   WBC 3.0* 04/19/2014   HGB 12.1* 04/19/2014   HCT 37.1* 04/19/2014   MCV 94.8 04/19/2014  PLT 181 04/19/2014     Chemistry      Component Value Date/Time   NA 139 04/19/2014 0831   NA 139 03/21/2014 0520   K 4.0 04/19/2014 0831   K 4.1 03/21/2014 0520   CL 101  03/21/2014 0520   CL 105 01/01/2013 0835   CO2 27 04/19/2014 0831   CO2 26 03/21/2014 0520   BUN 10.9 04/19/2014 0831   BUN 6 03/21/2014 0520   CREATININE 0.8 04/19/2014 0831   CREATININE 0.80 03/21/2014 0520   CREATININE (Revised) 0.92 07/24/2009 1449      Component Value Date/Time   CALCIUM 9.1 04/19/2014 0831   CALCIUM 9.4 03/21/2014 0520   ALKPHOS 86 04/19/2014 0831   ALKPHOS 80 03/18/2014 0140   AST 19 04/19/2014 0831   AST 20 03/18/2014 0140   ALT 24 04/19/2014 0831   ALT 25 03/18/2014 0140   BILITOT 0.29 04/19/2014 0831   BILITOT 0.3 03/18/2014 0140     Results for Travis Nguyen (MRN [id number]) as of 04/19/2014 09:09  Ref. Range 01/21/2014 07:59 03/09/2014 08:19  IgG (Immunoglobin G), Serum Latest Range: [id number] mg/dL 870 1030  IgA Latest Range: 68-379 mg/dL 97 144  IgM, Serum Latest Range: 41-251 mg/dL 62 85  Kappa free light chain Latest Range: 0.33-1.94 mg/dL 1.27 1.27  Lambda Free Lght Chn Latest Range: 0.57-2.63 mg/dL 1.01 1.10  Kappa:Lambda Ratio Latest Range: 0.26-1.65  1.26 1.15     Impression and Plan:  63 year old gentleman with the following issues  1.IgG Kappa Multiple Myeloma: His last protein studies on 03/09/2014 showed complete response without any evidence of an M spike. His quantitative immunoglobulins are all within normal range. He is not tolerating maintenance Revlimid at this time and would like to stop. The plan is to refer him back to Dr. Melba Coon for reevaluation and possible initiation of consolidative autologous stem cell transplant. He is healed and ready to proceed from a prostate cancer standpoint.     2. Prostate cancer: He is status post prostatectomy on 02/09/2014. His pathology showed a Gleason score 4+3 equals 7 with disease involving both lobes. He has extraprostatic extension but no lymph node involvement. He is well healed for the time being and ready to proceed with the next step to treating his multiple myeloma.  3. Chronic back pain: He'll continue  on oxycodone every 12 hours.  4. Followup: Will be in 6 weeks for reevaluation and a Port-A-Cath flush.   [facility], MD 10/6/20159:44 AM

## 2014-04-19 NOTE — Patient Instructions (Signed)

## 2014-04-19 NOTE — Telephone Encounter (Signed)
Left message referring patient back to Dr Melba Coon for possible stem cell transplant. He has been seen there in the past.

## 2014-04-19 NOTE — Telephone Encounter (Signed)
, °

## 2014-04-27 ENCOUNTER — Telehealth: Payer: Self-pay | Admitting: Medical Oncology

## 2014-04-27 NOTE — Telephone Encounter (Signed)
Patient called concerned he hadn't heard from Dr. Melba Coon office regarding appt. F/U call to Dr. Melba Coon office, patient has appt sched for Nov 4 @ 2pm with labs and MD @ 3 pm. Per their office, they have not been able to get a hold of patient d/t old phone number listed. Patient's new phone contact provided.  Patient aware of appt and contact number to Dr. Melba Coon office provided. Patient expressed thanks and understanding, no further questions at this time.    Mssg routed to MD.

## 2014-04-28 ENCOUNTER — Telehealth: Payer: Self-pay | Admitting: Medical Oncology

## 2014-04-28 ENCOUNTER — Telehealth: Payer: Self-pay | Admitting: *Deleted

## 2014-04-28 NOTE — Telephone Encounter (Signed)
PT. HAS TRANSPORTATION ISSUES AND NEEDS A FACILITY CLOSER TO HOME .

## 2014-04-28 NOTE — Telephone Encounter (Signed)
F/U call to patient d/t earlier today walk-in and message from triage nurse regarding patient's request for "going to [facility] for stem cell transplant." Informed patient that Dr. Hazeline Junker preference is for patient to go to [facility], patient has been to [facility] before and they are familiar with his history as well as appt is sched and it may take longer to have appt sched at another facility, which may not be beneficial for patient's health. Patient expressed understanding and agreement, states will keep his sched appt with Dr. Melba Coon.  Patient knows to call office with further questions or concerns.

## 2014-05-13 ENCOUNTER — Telehealth: Payer: Self-pay | Admitting: Oncology

## 2014-05-13 NOTE — Telephone Encounter (Signed)
S/w pt advised appt chg from 11/18 to 11/25. Pt verblaized understanding.

## 2014-05-23 ENCOUNTER — Encounter: Payer: Self-pay | Admitting: *Deleted

## 2014-05-23 ENCOUNTER — Emergency Department (HOSPITAL_COMMUNITY): Payer: Medicaid Other

## 2014-05-23 ENCOUNTER — Emergency Department (HOSPITAL_COMMUNITY)
Admission: EM | Admit: 2014-05-23 | Discharge: 2014-05-23 | Disposition: A | Discharge status: Home / Self Care | Payer: Medicaid Other | Attending: Emergency Medicine | Admitting: Emergency Medicine

## 2014-05-23 ENCOUNTER — Encounter (HOSPITAL_COMMUNITY): Payer: Self-pay | Admitting: Emergency Medicine

## 2014-05-23 DIAGNOSIS — Y998 Other external cause status: Secondary | ICD-10-CM | POA: Diagnosis not present

## 2014-05-23 DIAGNOSIS — Z8719 Personal history of other diseases of the digestive system: Secondary | ICD-10-CM | POA: Insufficient documentation

## 2014-05-23 DIAGNOSIS — R011 Cardiac murmur, unspecified: Secondary | ICD-10-CM | POA: Diagnosis not present

## 2014-05-23 DIAGNOSIS — Z8579 Personal history of other malignant neoplasms of lymphoid, hematopoietic and related tissues: Secondary | ICD-10-CM | POA: Diagnosis not present

## 2014-05-23 DIAGNOSIS — Z79899 Other long term (current) drug therapy: Secondary | ICD-10-CM | POA: Insufficient documentation

## 2014-05-23 DIAGNOSIS — Z8701 Personal history of pneumonia (recurrent): Secondary | ICD-10-CM | POA: Diagnosis not present

## 2014-05-23 DIAGNOSIS — Y939 Activity, unspecified: Secondary | ICD-10-CM | POA: Diagnosis not present

## 2014-05-23 DIAGNOSIS — I10 Essential (primary) hypertension: Secondary | ICD-10-CM | POA: Insufficient documentation

## 2014-05-23 DIAGNOSIS — S161XXA Strain of muscle, fascia and tendon at neck level, initial encounter: Secondary | ICD-10-CM | POA: Diagnosis not present

## 2014-05-23 DIAGNOSIS — G8929 Other chronic pain: Secondary | ICD-10-CM | POA: Insufficient documentation

## 2014-05-23 DIAGNOSIS — M542 Cervicalgia: Secondary | ICD-10-CM | POA: Diagnosis present

## 2014-05-23 DIAGNOSIS — Y929 Unspecified place or not applicable: Secondary | ICD-10-CM | POA: Diagnosis not present

## 2014-05-23 DIAGNOSIS — Z8709 Personal history of other diseases of the respiratory system: Secondary | ICD-10-CM | POA: Insufficient documentation

## 2014-05-23 DIAGNOSIS — X58XXXA Exposure to other specified factors, initial encounter: Secondary | ICD-10-CM | POA: Diagnosis not present

## 2014-05-23 DIAGNOSIS — Z87891 Personal history of nicotine dependence: Secondary | ICD-10-CM | POA: Diagnosis not present

## 2014-05-23 DIAGNOSIS — Z85820 Personal history of malignant melanoma of skin: Secondary | ICD-10-CM | POA: Diagnosis not present

## 2014-05-23 DIAGNOSIS — Z862 Personal history of diseases of the blood and blood-forming organs and certain disorders involving the immune mechanism: Secondary | ICD-10-CM | POA: Insufficient documentation

## 2014-05-23 DIAGNOSIS — Z8546 Personal history of malignant neoplasm of prostate: Secondary | ICD-10-CM | POA: Diagnosis not present

## 2014-05-23 DIAGNOSIS — M1990 Unspecified osteoarthritis, unspecified site: Secondary | ICD-10-CM | POA: Diagnosis not present

## 2014-05-23 MED ORDER — CYCLOBENZAPRINE HCL 10 MG PO TABS: 10.0000 mg | ORAL_TABLET | Freq: Two times a day (BID) | ORAL | Status: DC | PRN

## 2014-05-23 NOTE — Discharge Instructions (Signed)
Take flexeril for muscle spasms.   You need to call your doctor to get refill for oxycontin.   Return to ER if you have severe pain, fever, headache, weakness.

## 2014-05-23 NOTE — ED Notes (Signed)
MD at bedside. 

## 2014-05-23 NOTE — ED Notes (Signed)
Patient transported to X-ray 
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
## 2014-05-23 NOTE — ED Notes (Signed)
Pt c/o neck pain upon waking up in the morning, pt states it has been going on for a while. Pt states when he "works it" it kind of eases off but it still hurts to turn neck to left.

## 2014-05-29 ENCOUNTER — Encounter (HOSPITAL_COMMUNITY): Payer: Self-pay | Admitting: Emergency Medicine

## 2014-05-29 ENCOUNTER — Inpatient Hospital Stay (HOSPITAL_COMMUNITY)
Admission: EM | Admit: 2014-05-29 | Discharge: 2014-06-01 | DRG: 390 | Disposition: A | Discharge status: Home / Self Care | Payer: Medicaid Other | Attending: Internal Medicine | Admitting: Internal Medicine

## 2014-05-29 ENCOUNTER — Emergency Department (HOSPITAL_COMMUNITY): Payer: Medicaid Other

## 2014-05-29 DIAGNOSIS — M1990 Unspecified osteoarthritis, unspecified site: Secondary | ICD-10-CM | POA: Diagnosis present

## 2014-05-29 DIAGNOSIS — K5669 Other intestinal obstruction: Secondary | ICD-10-CM

## 2014-05-29 DIAGNOSIS — K56609 Unspecified intestinal obstruction, unspecified as to partial versus complete obstruction: Secondary | ICD-10-CM | POA: Diagnosis present

## 2014-05-29 DIAGNOSIS — D638 Anemia in other chronic diseases classified elsewhere: Secondary | ICD-10-CM | POA: Diagnosis present

## 2014-05-29 DIAGNOSIS — K565 Intestinal adhesions [bands] with obstruction (postprocedural) (postinfection): Principal | ICD-10-CM | POA: Diagnosis present

## 2014-05-29 DIAGNOSIS — R1033 Periumbilical pain: Secondary | ICD-10-CM | POA: Diagnosis present

## 2014-05-29 DIAGNOSIS — Z8546 Personal history of malignant neoplasm of prostate: Secondary | ICD-10-CM

## 2014-05-29 DIAGNOSIS — M549 Dorsalgia, unspecified: Secondary | ICD-10-CM | POA: Diagnosis present

## 2014-05-29 DIAGNOSIS — Z87442 Personal history of urinary calculi: Secondary | ICD-10-CM

## 2014-05-29 DIAGNOSIS — D696 Thrombocytopenia, unspecified: Secondary | ICD-10-CM | POA: Diagnosis present

## 2014-05-29 DIAGNOSIS — Z87891 Personal history of nicotine dependence: Secondary | ICD-10-CM

## 2014-05-29 DIAGNOSIS — I10 Essential (primary) hypertension: Secondary | ICD-10-CM | POA: Diagnosis present

## 2014-05-29 LAB — CBC WITH DIFFERENTIAL/PLATELET
Basophils Absolute: 0 10*3/uL (ref 0.0–0.1)
Basophils Relative: 0 % (ref 0–1)
EOS PCT: 1 % (ref 0–5)
Eosinophils Absolute: 0 10*3/uL (ref 0.0–0.7)
HEMATOCRIT: 41.5 % (ref 39.0–52.0)
Hemoglobin: 14 g/dL (ref 13.0–17.0)
Lymphocytes Relative: 32 % (ref 12–46)
Lymphs Abs: 1.5 10*3/uL (ref 0.7–4.0)
MCH: 31.1 pg (ref 26.0–34.0)
MCHC: 33.7 g/dL (ref 30.0–36.0)
MCV: 92.2 fL (ref 78.0–100.0)
MONO ABS: 0.3 10*3/uL (ref 0.1–1.0)
Monocytes Relative: 6 % (ref 3–12)
Neutro Abs: 3 10*3/uL (ref 1.7–7.7)
Neutrophils Relative %: 61 % (ref 43–77)
Platelets: 166 10*3/uL (ref 150–400)
RBC: 4.5 MIL/uL (ref 4.22–5.81)
RDW: 13.2 % (ref 11.5–15.5)
WBC: 4.8 10*3/uL (ref 4.0–10.5)

## 2014-05-29 LAB — LIPASE, BLOOD: Lipase: 26 U/L (ref 11–59)

## 2014-05-29 LAB — COMPREHENSIVE METABOLIC PANEL
ALK PHOS: 83 U/L (ref 39–117)
ALT: 18 U/L (ref 0–53)
AST: 22 U/L (ref 0–37)
Albumin: 4.4 g/dL (ref 3.5–5.2)
Anion gap: 12 (ref 5–15)
BILIRUBIN TOTAL: 0.4 mg/dL (ref 0.3–1.2)
BUN: 14 mg/dL (ref 6–23)
CALCIUM: 10.2 mg/dL (ref 8.4–10.5)
CO2: 28 mEq/L (ref 19–32)
Chloride: 99 mEq/L (ref 96–112)
Creatinine, Ser: 0.94 mg/dL (ref 0.50–1.35)
GFR calc non Af Amer: 88 mL/min — ABNORMAL LOW (ref >90)
GLUCOSE: 95 mg/dL (ref 70–99)
Potassium: 4.9 mEq/L (ref 3.7–5.3)
Sodium: 139 mEq/L (ref 137–147)
Total Protein: 8.1 g/dL (ref 6.0–8.3)

## 2014-05-29 MED ORDER — LIDOCAINE-PRILOCAINE 2.5-2.5 % EX CREA
TOPICAL_CREAM | Freq: Once | CUTANEOUS | Status: AC
  Administered 2014-05-30: 1 via TOPICAL

## 2014-05-29 MED ORDER — ENOXAPARIN SODIUM 40 MG/0.4ML ~~LOC~~ SOLN
40.0000 mg | SUBCUTANEOUS | Status: DC
  Filled 2014-05-29 (×2): qty 0.4

## 2014-05-29 MED ORDER — DOCUSATE SODIUM 100 MG PO CAPS
100.0000 mg | ORAL_CAPSULE | Freq: Two times a day (BID) | ORAL | Status: DC
  Administered 2014-05-29 – 2014-06-01 (×5): 100 mg via ORAL
  Filled 2014-05-29 (×7): qty 1

## 2014-05-29 MED ORDER — PANTOPRAZOLE SODIUM 40 MG IV SOLR
40.0000 mg | Freq: Every day | INTRAVENOUS | Status: DC
  Administered 2014-05-29 – 2014-05-31 (×3): 40 mg via INTRAVENOUS
  Filled 2014-05-29 (×4): qty 40

## 2014-05-29 MED ORDER — MORPHINE SULFATE 4 MG/ML IJ SOLN
4.0000 mg | Freq: Once | INTRAMUSCULAR | Status: AC
  Administered 2014-05-29: 4 mg via INTRAVENOUS
  Filled 2014-05-29: qty 1

## 2014-05-29 MED ORDER — LIDOCAINE HCL 2 % EX GEL
CUTANEOUS | Status: AC
  Administered 2014-05-29: 20:00:00
  Filled 2014-05-29: qty 10

## 2014-05-29 MED ORDER — ACETAMINOPHEN 325 MG PO TABS: 650.0000 mg | ORAL_TABLET | Freq: Four times a day (QID) | ORAL | Status: DC | PRN

## 2014-05-29 MED ORDER — MORPHINE SULFATE 2 MG/ML IJ SOLN
2.0000 mg | INTRAMUSCULAR | Status: DC | PRN
  Administered 2014-05-29 – 2014-05-30 (×3): 2 mg via INTRAVENOUS
  Filled 2014-05-29 (×3): qty 1

## 2014-05-29 MED ORDER — ONDANSETRON HCL 4 MG/2ML IJ SOLN: 4.0000 mg | Freq: Four times a day (QID) | INTRAMUSCULAR | Status: DC | PRN

## 2014-05-29 MED ORDER — SODIUM CHLORIDE 0.9 % IV SOLN
INTRAVENOUS | Status: AC
  Administered 2014-05-29: 1000 mL via INTRAVENOUS

## 2014-05-29 MED ORDER — HYDRALAZINE HCL 20 MG/ML IJ SOLN: 10.0000 mg | INTRAMUSCULAR | Status: DC | PRN

## 2014-05-29 MED ORDER — ONDANSETRON HCL 4 MG PO TABS: 4.0000 mg | ORAL_TABLET | Freq: Four times a day (QID) | ORAL | Status: DC | PRN

## 2014-05-29 MED ORDER — IOHEXOL 300 MG/ML  SOLN
100.0000 mL | Freq: Once | INTRAMUSCULAR | Status: AC | PRN
  Administered 2014-05-29: 100 mL via INTRAVENOUS

## 2014-05-29 MED ORDER — HYDROCODONE-ACETAMINOPHEN 5-325 MG PO TABS
1.0000 | ORAL_TABLET | ORAL | Status: DC | PRN
  Filled 2014-05-29: qty 2

## 2014-05-29 MED ORDER — IOHEXOL 300 MG/ML  SOLN
50.0000 mL | Freq: Once | INTRAMUSCULAR | Status: AC | PRN
  Administered 2014-05-29: 50 mL via ORAL

## 2014-05-29 MED ORDER — ACETAMINOPHEN 650 MG RE SUPP: 650.0000 mg | Freq: Four times a day (QID) | RECTAL | Status: DC | PRN

## 2014-05-29 NOTE — ED Notes (Signed)
Travis Nguyen [phone]

## 2014-05-29 NOTE — ED Notes (Signed)
Pt reports constant sharp severe periumbilical pain for the past few days. Hx of prostate surgery and bowel obstruction. Denies N/V/D. Pt reports normal BM today.
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
## 2014-05-30 ENCOUNTER — Inpatient Hospital Stay (HOSPITAL_COMMUNITY): Payer: Medicaid Other

## 2014-05-30 DIAGNOSIS — D696 Thrombocytopenia, unspecified: Secondary | ICD-10-CM

## 2014-05-30 DIAGNOSIS — D638 Anemia in other chronic diseases classified elsewhere: Secondary | ICD-10-CM

## 2014-05-30 LAB — CBC
HCT: 36.6 % — ABNORMAL LOW (ref 39.0–52.0)
Hemoglobin: 12.5 g/dL — ABNORMAL LOW (ref 13.0–17.0)
MCH: 31.6 pg (ref 26.0–34.0)
MCHC: 34.2 g/dL (ref 30.0–36.0)
MCV: 92.4 fL (ref 78.0–100.0)
PLATELETS: 129 10*3/uL — AB (ref 150–400)
RBC: 3.96 MIL/uL — ABNORMAL LOW (ref 4.22–5.81)
RDW: 13.4 % (ref 11.5–15.5)
WBC: 3.9 10*3/uL — ABNORMAL LOW (ref 4.0–10.5)

## 2014-05-30 LAB — COMPREHENSIVE METABOLIC PANEL
ALT: 186 U/L — ABNORMAL HIGH (ref 0–53)
AST: 197 U/L — ABNORMAL HIGH (ref 0–37)
Albumin: 3.5 g/dL (ref 3.5–5.2)
Alkaline Phosphatase: 107 U/L (ref 39–117)
Anion gap: 9 (ref 5–15)
BUN: 12 mg/dL (ref 6–23)
CHLORIDE: 102 meq/L (ref 96–112)
CO2: 27 meq/L (ref 19–32)
Calcium: 9 mg/dL (ref 8.4–10.5)
Creatinine, Ser: 0.83 mg/dL (ref 0.50–1.35)
GFR calc Af Amer: >90 mL/min (ref >90)
Glucose, Bld: 98 mg/dL (ref 70–99)
Potassium: 4.3 mEq/L (ref 3.7–5.3)
SODIUM: 138 meq/L (ref 137–147)
Total Bilirubin: 0.4 mg/dL (ref 0.3–1.2)
Total Protein: 6.8 g/dL (ref 6.0–8.3)

## 2014-05-30 LAB — PHOSPHORUS: Phosphorus: 3.6 mg/dL (ref 2.3–4.6)

## 2014-05-30 LAB — MAGNESIUM: Magnesium: 2.2 mg/dL (ref 1.5–2.5)

## 2014-05-30 LAB — TSH: TSH: 0.807 u[IU]/mL (ref 0.350–4.500)

## 2014-05-30 MED ORDER — PHENOL 1.4 % MT LIQD
1.0000 | OROMUCOSAL | Status: DC | PRN
  Filled 2014-05-30: qty 177

## 2014-05-30 MED ORDER — SODIUM CHLORIDE 0.9 % IV SOLN
INTRAVENOUS | Status: AC
  Administered 2014-05-30: 11:00:00 via INTRAVENOUS

## 2014-05-30 MED ORDER — CETYLPYRIDINIUM CHLORIDE 0.05 % MT LIQD
7.0000 mL | Freq: Two times a day (BID) | OROMUCOSAL | Status: DC
  Administered 2014-05-30 – 2014-06-01 (×5): 7 mL via OROMUCOSAL

## 2014-05-30 NOTE — Plan of Care (Signed)
Problem: Phase I Progression Outcomes Goal: Pain controlled with appropriate interventions Outcome: Completed/Met Date Met:  05/30/14     

## 2014-05-30 NOTE — Plan of Care (Signed)
Problem: Phase I Progression Outcomes Goal: Voiding-avoid urinary catheter unless indicated Outcome: Completed/Met Date Met:  05/30/14     

## 2014-05-30 NOTE — Plan of Care (Signed)
Problem: Phase I Progression Outcomes Goal: Hemodynamically stable Outcome: Completed/Met Date Met:  05/30/14     

## 2014-05-30 NOTE — Progress Notes (Signed)
Spoke to [place] (on call MD) about accessing patient's Port a cath. She said it was OK to do so and gave order for emla cream to apply prior.

## 2014-05-30 NOTE — Plan of Care (Signed)
Problem: Consults Goal: Diabetes Guidelines if Diabetic/Glucose > 140 If diabetic or lab glucose is > 140 mg/dl - Initiate Diabetes/Hyperglycemia Guidelines & Document Interventions  Outcome: Not Applicable Date Met:  99/14/44

## 2014-05-30 NOTE — Progress Notes (Signed)
Called ED to get report from [place].

## 2014-05-30 NOTE — Plan of Care (Signed)
Problem: Consults Goal: General Medical Patient Education See Patient Education Module for specific education.  Outcome: Completed/Met Date Met:  05/30/14  Problem: Phase I Progression Outcomes Goal: OOB as tolerated unless otherwise ordered Outcome: Completed/Met Date Met:  05/30/14  Problem: Phase II Progression Outcomes Goal: Progress activity as tolerated unless otherwise ordered Outcome: Completed/Met Date Met:  05/30/14

## 2014-05-30 NOTE — Progress Notes (Signed)
Patient admitted from ED with SBO to room 1324. Oriented to room and unit.

## 2014-05-30 NOTE — Progress Notes (Signed)
Subjective: He says he feels better and passing some flatus.  Film this Am not that much better he had similar episode back in September.  Objective: Vital signs in last 24 hours: Temp:  [97.4 F (36.3 C)-98.4 F (36.9 C)] 98.4 F (36.9 C) (11/16 1301) Pulse Rate:  [77-88] 77 (11/16 1301) Resp:  [16-18] 18 (11/16 1301) BP: (106-133)/(80-91) 124/91 mmHg (11/16 1301) SpO2:  [100 %] 100 % (11/16 1301) Weight:  [68.947 kg (152 lb)] 68.947 kg (152 lb) (11/15 1616) Last BM Date: 05/29/14 600 from NG Afebrile, VSS BP up some LFT's up some Film this AM shows ongoing PSBO Intake/Output from previous day: 11/15 0701 - 11/16 0700 In: 647 [I.V.:647] Out: 850 [Urine:250; Emesis/NG output:600] Intake/Output this shift: Total I/O In: -  Out: 200 [Urine:200]  General appearance: alert, cooperative and no distress GI: soft not really distended + Bs, some flatus.  Lab Results:   Recent Labs  05/29/14 1700 05/30/14 0614  WBC 4.8 3.9*  HGB 14.0 12.5*  HCT 41.5 36.6*  PLT 166 129*    BMET  Recent Labs  05/29/14 1700 05/30/14 0614  NA 139 138  K 4.9 4.3  CL 99 102  CO2 28 27  GLUCOSE 95 98  BUN 14 12  CREATININE 0.94 0.83  CALCIUM 10.2 9.0   PT/INR No results for input(s): LABPROT, INR in the last 72 hours.   Recent Labs Lab 05/29/14 1700 05/30/14 0614  AST 22 197*  ALT 18 186*  ALKPHOS 83 107  BILITOT 0.4 0.4  PROT 8.1 6.8  ALBUMIN 4.4 3.5     Lipase     Component Value Date/Time   LIPASE 26 05/29/2014 1700     Studies/Results: Ct Abdomen Pelvis W Contrast  05/29/2014   CLINICAL DATA:  Periumbilical pain, personal history of multiple myeloma.  EXAM: CT ABDOMEN AND PELVIS WITH CONTRAST  TECHNIQUE: Multidetector CT imaging of the abdomen and pelvis was performed using the  Standard protocol following bolus administration of intravenous contrast.  CONTRAST:  70m OMNIPAQUE IOHEXOL 300 MG/ML SOLN, 1074mOMNIPAQUE IOHEXOL 300 MG/ML SOLN  COMPARISON:   03/18/2014  FINDINGS: Lung bases are free of acute infiltrate or sizable effusion.  A hypodense lesion with peripheral nodular enhancement is noted in the right lobe of the liver stable from the prior exam consistent with a hepatic hemangioma. Tiny hypodensity is noted to the tip of the liver on the not well characterized on this examination. The gallbladder is been surgically removed. Mild hepatic biliary dilatation is noted. The spleen, adrenal glands and pancreas are normal in their CT appearance. The kidneys are well visualized bilaterally and reveal no renal calculi or obstructive changes. Bilateral renal cystic change is again seen.  There multiple dilated loops of small bowel which arise in the distal aspect of the jejunum and extend into the ileum. These continue to just adjacent to the urinary bladder and on image number 76 of series 2 a focal transitional zone is noted. The more distal ileum is within normal limits. This transition zone is in a similar location to that seen on the prior exam. These changes are again suggestive of adhesions as no definitive mass lesion is noted.  The bladder is partially distended. No pelvic mass lesion is seen. The appendix is not visualized consistent with a prior surgical history. No acute bony abnormality is noted.  IMPRESSION: Changes consistent with high-grade small bowel obstruction in the distal jejunum and proximal ileum with a transition zone low in  the pelvis adjacent to the bladder. This is again suggestive of adhesions as no definitive mass lesion is noted.  Chronic changes similar to that noted on the prior exam.   Electronically Signed   By: Inez Catalina M.D.   On: 05/29/2014 18:48   Dg Abd 2 Views  05/30/2014   CLINICAL DATA:  Followup of abdominal distention and small bowel obstruction.  EXAM: ABDOMEN - 2 VIEW  COMPARISON:  CT 1 day prior  FINDINGS: Upright and supine views. Nasogastric tube which terminates at the body of the stomach. No free  intraperitoneal air. Small bowel distention with an index mid abdominal loop measuring 4.8 cm. Similar to on the prior, given across modality comparison. Multiple small bowel air-fluid levels. Stool and gas remain within the colon. Contrast within the urinary bladder from yesterday's CT. Bilateral hip osteoarthritis.  IMPRESSION: No change in a partial small bowel obstruction.   Electronically Signed   By: Abigail Miyamoto M.D.   On: 05/30/2014 08:39    Medications: . antiseptic oral rinse  7 mL Mouth Rinse BID  . docusate sodium  100 mg Oral BID  . pantoprazole (PROTONIX) IV  40 mg Intravenous QHS    Assessment/Plan SBO Robot assisted laparoscopic radical prostatectomy7/29/15, prior appendectomy and cholecystectomy  02/09/2014  HYpertension Anemia Multiple myeloma BACK pain   Plan : NG clamping trial and see how he does recheck film in AM. ambulate  LOS: 1 day    Jaedan Schuman 05/30/2014

## 2014-05-30 NOTE — Progress Notes (Signed)
Patient ID: Travis Nguyen, male   DOB: Oct 23, 1950, 62 y.o.   MRN: [id number] [facility] PROGRESS NOTE  COBE VINEY YYT:[id number] DOB: 09-22-1950 DOA: 05/29/2014 PCP: [name], MD  Brief narrative: 65 -year-old male with past medical history of prostate cancer, status post prostatectomy done laparoscopically, MGUS, history of small bowel obstruction, hypertension who presented to [facility] ED 05/29/2014 with worsening abdominal girth, distention, nausea and vomiting. On admission, CT abdomen showed a high grade small bowel obstruction in the distal jejunum and proximal ileum. Patient was admitted for management of small bowel obstruction, NG tube inserted. Surgery consulted.  Assessment/Plan:    Principal Problem: SBO (small bowel obstruction) - likely secondary to adhesions from previous surgeries. CT abdomen on admission showed high-grade small bowel obstruction in the distal jejunum and proximal ileum with a transition zone low in the pelvis adjacent to the bladder. - Appreciate surgery recommendations - We will continue conservative management with IV fluids, NG tube, antiemetics and analgesia as needed. - Abdominal x-ray this morning shows similar findings of small bowel obstructions compared to previous study Active Problems: Essential hypertension - blood pressure is 122/89. Antihypertensives from home are on hold because patient is nothing by mouth due to small bowel obstruction - Order placed for hydralazine when necessary IV for blood pressure control. Thrombocytopenia - Platelet count on admission was within normal limits, 166 but dropped noted to 129 since admission. - Stop Lovenox subcutaneous. Use SCDs for DVT prophylaxis. - patient has history of prostate cancer and multiple myeloma. Although this could contribute to thrombocytopenia patient did have normal platelet count on admission so most likely Lovenox contributing to dropping platelets. Anemia of chronic  disease - Likely secondary to history of malignancy, prostate cancer and multiple myeloma - Hemoglobin was within normal limits on the admission. This morning hemoglobin is 12.5, likely dilutional  DVT Prophylaxis  - SCD's bilaterally   Code Status: Full.  Family Communication:  plan of care discussed with the patient Disposition Plan: Home when stable.   IV access:   Peripheral IV  Procedures and diagnostic studies:    Ct Abdomen Pelvis W Contrast 05/29/2014   Changes consistent with high-grade small bowel obstruction in the distal jejunum and proximal ileum with a transition zone low in the pelvis adjacent to the bladder. This is again suggestive of adhesions as no definitive mass lesion is noted.  Chronic changes similar to that noted on the prior exam.     Dg Abd 2 Views 05/30/2014   No change in a partial small bowel obstruction.     Medical Consultants:   Surgery  Other Consultants:   None  IAnti-Infectives:    None    Leisa Lenz, MD  [facility] Pager [phone]  If 7PM-7AM, please contact night-coverage [email or web address] Password [email or web address] 05/30/2014, 10:51 AM   LOS: 1 day    HPI/Subjective: No acute overnight events.  Objective: Filed Vitals:   05/29/14 1838 05/29/14 2120 05/29/14 2150 05/30/14 0557  BP: 133/80 106/88  122/89  Pulse: 83 86 82 80  Temp:  98.1 F (36.7 C)  98.2 F (36.8 C)  TempSrc:  Oral  Oral  Resp: 18 16  16   Height:      Weight:      SpO2: 100% 100%  100%    Intake/Output Summary (Last 24 hours) at 05/30/14 1051 Last data filed at 05/30/14 0650  Gross per 24 hour  Intake    647 ml  Output    850 ml  Net   -203 ml    Exam:   General:  Pt is alert, follows commands appropriately, not in acute distress  Cardiovascular: Regular rate and rhythm, S1/S2, no murmurs  Respiratory: Clear to auscultation bilaterally, no wheezing, no crackles, no rhonchi  Abdomen: firm but non distended, bowel sounds present; NG tube in place    Extremities: No edema, pulses DP and PT palpable bilaterally  Neuro: Grossly nonfocal  Data Reviewed: Basic Metabolic Panel:  Recent Labs Lab 05/29/14 1700 05/30/14 0614  NA 139 138  K 4.9 4.3  CL 99 102  CO2 28 27  GLUCOSE 95 98  BUN 14 12  CREATININE 0.94 0.83  CALCIUM 10.2 9.0  MG  --  2.2  PHOS  --  3.6   Liver Function Tests:  Recent Labs Lab 05/29/14 1700 05/30/14 0614  AST 22 197*  ALT 18 186*  ALKPHOS 83 107  BILITOT 0.4 0.4  PROT 8.1 6.8  ALBUMIN 4.4 3.5    Recent Labs Lab 05/29/14 1700  LIPASE 26   No results for input(s): AMMONIA in the last 168 hours. CBC:  Recent Labs Lab 05/29/14 1700 05/30/14 0614  WBC 4.8 3.9*  NEUTROABS 3.0  --   HGB 14.0 12.5*  HCT 41.5 36.6*  MCV 92.2 92.4  PLT 166 129*   Cardiac Enzymes: No results for input(s): CKTOTAL, CKMB, CKMBINDEX, TROPONINI in the last 168 hours. BNP: Invalid input(s): POCBNP CBG: No results for input(s): GLUCAP in the last 168 hours.  No results found for this or any previous visit (from the past 240 hour(s)).   Scheduled Meds: . docusate sodium  100 mg Oral BID  . pantoprazole (PROTONIX) IV  40 mg Intravenous QHS

## 2014-05-31 ENCOUNTER — Inpatient Hospital Stay (HOSPITAL_COMMUNITY): Payer: Medicaid Other

## 2014-05-31 DIAGNOSIS — C9000 Multiple myeloma not having achieved remission: Secondary | ICD-10-CM

## 2014-05-31 MED ORDER — SODIUM CHLORIDE 0.9 % IV SOLN
INTRAVENOUS | Status: DC
  Administered 2014-05-31 – 2014-06-01 (×3): via INTRAVENOUS

## 2014-05-31 NOTE — Progress Notes (Signed)
Patient ID: Travis Nguyen, male   DOB: 02-07-1951, 63 y.o.   MRN: [id number] [facility] PROGRESS NOTE  BREVIN MCFADDEN JXB:[id number] DOB: 1951/04/25 DOA: 05/29/2014 PCP: [name], MD  Brief narrative: 89 -year-old male with past medical history of prostate cancer, status post prostatectomy done laparoscopically, MGUS, history of small bowel obstruction, hypertension who presented to [facility] ED 05/29/2014 with worsening abdominal girth, distention, nausea and vomiting. On admission, CT abdomen showed a high grade small bowel obstruction in the distal jejunum and proximal ileum. Patient was admitted for management of small bowel obstruction, NG tube inserted. Surgery consulted.   Assessment/Plan:     Principal Problem: SBO (small bowel obstruction) - likely secondary to adhesions from previous surgeries. CT abdomen on admission showed high-grade small bowel obstruction in the distal jejunum and proximal ileum with a transition zone low in the pelvis adjacent to the bladder. - abdominal x-ray done 05/30/2014 showed similar small bowel obstruction compared to prior studies. Clinically, patient looks better, passed flatus, abdomen less distended - Per surgery, NG tube will be removed and diet advanced to clear liquids. - continue supportive care with IV fluids and analgesia as needed. Active Problems: Essential hypertension - blood pressure at goal Thrombocytopenia - Platelet count on admission was within normal limits, 166 but dropped noted to 129 since admission. - no signs of bleeding. Lovenox was stopped and using SCDs for DVT prophylaxis.  - patient has history of prostate cancer and multiple myeloma. Although this could contribute to thrombocytopenia patient did have normal platelet count on admission so most likely Lovenox contributing to dropping platelets. Anemia of chronic disease - Likely secondary to history of malignancy, prostate cancer and multiple myeloma -  Hemoglobin was within normal limits on the admission. Repeat hemoglobin 12.5. No evidence of bleeding. - No current indications for transfusion.  DVT Prophylaxis  - SCD's bilaterally   Code Status: Full.  Family Communication: plan of care discussed with the patient Disposition Plan: Home when stable.     IV access:   Peripheral IV  Procedures and diagnostic studies:    Ct Abdomen Pelvis W Contrast 05/29/2014 Changes consistent with high-grade small bowel obstruction in the distal jejunum and proximal ileum with a transition zone low in the pelvis adjacent to the bladder. This is again suggestive of adhesions as no definitive mass lesion is noted. Chronic changes similar to that noted on the prior exam.    Dg Abd 2 Views 05/30/2014 No change in a partial small bowel obstruction.   Medical Consultants:   Surgery   Other Consultants:   None   IAnti-Infectives:    None    Leisa Lenz, MD  [facility] Pager [phone]  If 7PM-7AM, please contact night-coverage [email or web address] Password [email or web address] 05/31/2014, 10:55 AM   LOS: 2 days    HPI/Subjective: No acute overnight events.  Objective: Filed Vitals:   05/29/14 2150 05/30/14 0557 05/30/14 1301 05/30/14 2054  BP:  122/89 124/91 133/76  Pulse: 82 80 77 70  Temp:  98.2 F (36.8 C) 98.4 F (36.9 C) 98.6 F (37 C)  TempSrc:  Oral Oral Oral  Resp:  16 18 18   Height:      Weight:      SpO2:  100% 100% 98%    Intake/Output Summary (Last 24 hours) at 05/31/14 1055 Last data filed at 05/31/14 0847  Gross per 24 hour  Intake      0 ml  Output    500 ml  Net   -  500 ml    Exam:   General:  Pt is alert, follows commands appropriately, not in acute distress  Cardiovascular: Regular rate and rhythm, S1/S2, no murmurs  Respiratory: Clear to auscultation bilaterally, no wheezing, no crackles, no rhonchi  Abdomen: not distended, bowel sounds present; NG tube in place   Extremities: No edema,  pulses DP and PT palpable bilaterally  Neuro: Grossly nonfocal  Data Reviewed: Basic Metabolic Panel:  Recent Labs Lab 05/29/14 1700 05/30/14 0614  NA 139 138  K 4.9 4.3  CL 99 102  CO2 28 27  GLUCOSE 95 98  BUN 14 12  CREATININE 0.94 0.83  CALCIUM 10.2 9.0  MG  --  2.2  PHOS  --  3.6   Liver Function Tests:  Recent Labs Lab 05/29/14 1700 05/30/14 0614  AST 22 197*  ALT 18 186*  ALKPHOS 83 107  BILITOT 0.4 0.4  PROT 8.1 6.8  ALBUMIN 4.4 3.5    Recent Labs Lab 05/29/14 1700  LIPASE 26   No results for input(s): AMMONIA in the last 168 hours. CBC:  Recent Labs Lab 05/29/14 1700 05/30/14 0614  WBC 4.8 3.9*  NEUTROABS 3.0  --   HGB 14.0 12.5*  HCT 41.5 36.6*  MCV 92.2 92.4  PLT 166 129*   Cardiac Enzymes: No results for input(s): CKTOTAL, CKMB, CKMBINDEX, TROPONINI in the last 168 hours. BNP: Invalid input(s): POCBNP CBG: No results for input(s): GLUCAP in the last 168 hours.  No results found for this or any previous visit (from the past 240 hour(s)).   Scheduled Meds: . antiseptic oral rinse  7 mL Mouth Rinse BID  . docusate sodium  100 mg Oral BID  . pantoprazole (PROTONIX) IV  40 mg Intravenous QHS   Continuous Infusions: . sodium chloride 75 mL/hr at 05/31/14 0141

## 2014-05-31 NOTE — Care Management Note (Signed)
CARE MANAGEMENT NOTE 05/31/2014  Patient:  Travis, Nguyen   Account Number:  [id number]  Date Initiated:  05/31/2014  Documentation initiated by:  Marney Doctor  Subjective/Objective Assessment:   63 yo admitted with SBO.  PMH of Hypertension; Pneumonia; Anemia; Gall stones; Arthritis; Back pain; Sinus problem; Heart murmur; Cancer; Melanoma; Prostate cancer (11/2012); and Multiple myeloma (dx'd 10/2009).       Action/Plan:   From home with spouse   Anticipated DC Date:  06/04/2014   Anticipated DC Plan:  [place]  CM consult      Choice offered to / List presented to:             Status of service:  In process, will continue to follow Medicare Important Message given?   (If response is "NO", the following Medicare IM given date fields will be blank) Date Medicare IM given:   Medicare IM given by:   Date Additional Medicare IM given:   Additional Medicare IM given by:    Discharge Disposition:    Per UR Regulation:  Reviewed for med. necessity/level of care/duration of stay  If discussed at [place] of Stay Meetings, dates discussed:    Comments:  05/31/14 Marney Doctor RN,BSN,NCM [phone] Chart reviewed and CM following for DC needs.

## 2014-05-31 NOTE — Progress Notes (Signed)
Subjective: No nausea, tube clamped all day, passing gas.    Objective: Vital signs in last 24 hours: Temp:  [98.4 F (36.9 C)-98.6 F (37 C)] 98.6 F (37 C) (11/16 2054) Pulse Rate:  [70-77] 70 (11/16 2054) Resp:  [18] 18 (11/16 2054) BP: (124-133)/(76-91) 133/76 mmHg (11/16 2054) SpO2:  [98 %-100 %] 98 % (11/16 2054) Last BM Date: 05/29/14 Nothing recorded on I/O NPo Afebrile,VSS, no BP recorded since 2000 No labs Intake/Output from previous day: 11/16 0701 - 11/17 0700 In: -  Out: 400 [Urine:400] Intake/Output this shift: Total I/O In: 0  Out: 300 [Urine:300]  General appearance: alert, cooperative and no distress GI: soft, he is up walking, few BS, some flatus  Lab Results:   Recent Labs  05/29/14 1700 05/30/14 0614  WBC 4.8 3.9*  HGB 14.0 12.5*  HCT 41.5 36.6*  PLT 166 129*    BMET  Recent Labs  05/29/14 1700 05/30/14 0614  NA 139 138  K 4.9 4.3  CL 99 102  CO2 28 27  GLUCOSE 95 98  BUN 14 12  CREATININE 0.94 0.83  CALCIUM 10.2 9.0   PT/INR No results for input(s): LABPROT, INR in the last 72 hours.   Recent Labs Lab 05/29/14 1700 05/30/14 0614  AST 22 197*  ALT 18 186*  ALKPHOS 83 107  BILITOT 0.4 0.4  PROT 8.1 6.8  ALBUMIN 4.4 3.5     Lipase     Component Value Date/Time   LIPASE 26 05/29/2014 1700     Studies/Results: Ct Abdomen Pelvis W Contrast  05/29/2014   CLINICAL DATA:  Periumbilical pain, personal history of multiple myeloma.  EXAM: CT ABDOMEN AND PELVIS WITH CONTRAST  TECHNIQUE: Multidetector CT imaging of the abdomen and pelvis was performed using the  Standard protocol following bolus administration of intravenous contrast.  CONTRAST:  74m OMNIPAQUE IOHEXOL 300 MG/ML SOLN, 108mOMNIPAQUE IOHEXOL 300 MG/ML SOLN  COMPARISON:  03/18/2014  FINDINGS: Lung bases are free of acute infiltrate or sizable effusion.  A hypodense lesion with peripheral nodular enhancement is noted in the right lobe of the liver stable from  the prior exam consistent with a hepatic hemangioma. Tiny hypodensity is noted to the tip of the liver on the not well characterized on this examination. The gallbladder is been surgically removed. Mild hepatic biliary dilatation is noted. The spleen, adrenal glands and pancreas are normal in their CT appearance. The kidneys are well visualized bilaterally and reveal no renal calculi or obstructive changes. Bilateral renal cystic change is again seen.  There multiple dilated loops of small bowel which arise in the distal aspect of the jejunum and extend into the ileum. These continue to just adjacent to the urinary bladder and on image number 76 of series 2 a focal transitional zone is noted. The more distal ileum is within normal limits. This transition zone is in a similar location to that seen on the prior exam. These changes are again suggestive of adhesions as no definitive mass lesion is noted.  The bladder is partially distended. No pelvic mass lesion is seen. The appendix is not visualized consistent with a prior surgical history. No acute bony abnormality is noted.  IMPRESSION: Changes consistent with high-grade small bowel obstruction in the distal jejunum and proximal ileum with a transition zone low in the pelvis adjacent to the bladder. This is again suggestive of adhesions as no definitive mass lesion is noted.  Chronic changes similar to that noted on the prior exam.  Electronically Signed   By: Inez Catalina M.D.   On: 05/29/2014 18:48   Dg Abd 2 Views  05/30/2014   CLINICAL DATA:  Followup of abdominal distention and small bowel obstruction.  EXAM: ABDOMEN - 2 VIEW  COMPARISON:  CT 1 day prior  FINDINGS: Upright and supine views. Nasogastric tube which terminates at the body of the stomach. No free intraperitoneal air. Small bowel distention with an index mid abdominal loop measuring 4.8 cm. Similar to on the prior, given across modality comparison. Multiple small bowel air-fluid levels. Stool  and gas remain within the colon. Contrast within the urinary bladder from yesterday's CT. Bilateral hip osteoarthritis.  IMPRESSION: No change in a partial small bowel obstruction.   Electronically Signed   By: Abigail Miyamoto M.D.   On: 05/30/2014 08:39    Medications: . antiseptic oral rinse  7 mL Mouth Rinse BID  . docusate sodium  100 mg Oral BID  . pantoprazole (PROTONIX) IV  40 mg Intravenous QHS    Assessment/Plan SBO Robot assisted laparoscopic radical prostatectomy7/29/15, prior appendectomy and cholecystectomy  02/09/2014  HYpertension Anemia Multiple myeloma BACK pain   Plan:  D/c NG and clears.   LOS: 2 days    Travis Nguyen 05/31/2014

## 2014-05-31 NOTE — Plan of Care (Signed)
Problem: Phase II Progression Outcomes Goal: Discharge plan established Outcome: Progressing Goal: Vital signs remain stable Outcome: Completed/Met Date Met:  05/31/14  Problem: Phase III Progression Outcomes Goal: Activity at appropriate level-compared to baseline (UP IN CHAIR FOR HEMODIALYSIS)  Outcome: Completed/Met Date Met:  05/31/14 Goal: Voiding independently Outcome: Completed/Met Date Met:  05/31/14

## 2014-06-01 ENCOUNTER — Other Ambulatory Visit: Payer: No Typology Code available for payment source

## 2014-06-01 ENCOUNTER — Ambulatory Visit: Payer: No Typology Code available for payment source | Admitting: Oncology

## 2014-06-01 MED ORDER — DSS 100 MG PO CAPS: 100.0000 mg | ORAL_CAPSULE | Freq: Two times a day (BID) | ORAL | Status: DC | PRN

## 2014-06-01 MED ORDER — PANTOPRAZOLE SODIUM 40 MG PO TBEC: 40.0000 mg | DELAYED_RELEASE_TABLET | Freq: Every day | ORAL | Status: DC

## 2014-06-01 MED ORDER — CYCLOBENZAPRINE HCL 10 MG PO TABS: 10.0000 mg | ORAL_TABLET | Freq: Two times a day (BID) | ORAL | Status: DC | PRN

## 2014-06-01 MED ORDER — HEPARIN SOD (PORK) LOCK FLUSH 100 UNIT/ML IV SOLN
500.0000 [IU] | INTRAVENOUS | Status: AC | PRN
  Administered 2014-06-01: 500 [IU]
  Filled 2014-06-01: qty 5

## 2014-06-01 NOTE — Progress Notes (Signed)
  Subjective: He looks good and abdomen is better.    Objective: Vital signs in last 24 hours: Temp:  [98.3 F (36.8 C)-98.8 F (37.1 C)] 98.3 F (36.8 C) (11/18 0500) Pulse Rate:  [66-81] 66 (11/18 0500) Resp:  [16-18] 16 (11/18 0500) BP: (132-153)/(77-83) 140/81 mmHg (11/18 0500) SpO2:  [100 %] 100 % (11/18 0500) Last BM Date: 05/29/14 +BM Full liquids afebrile VSS No labs Intake/Output from previous day: 06-23-2023 0701 - 11/18 0700 In: 0  Out: 2475 [Urine:2475] Intake/Output this shift:    General appearance: alert, cooperative and no distress GI: soft, non-tender; bowel sounds normal; no masses,  no organomegaly  Lab Results:   Recent Labs  05/29/14 1700 05/30/14 0614  WBC 4.8 3.9*  HGB 14.0 12.5*  HCT 41.5 36.6*  PLT 166 129*    BMET  Recent Labs  05/29/14 1700 05/30/14 0614  NA 139 138  K 4.9 4.3  CL 99 102  CO2 28 27  GLUCOSE 95 98  BUN 14 12  CREATININE 0.94 0.83  CALCIUM 10.2 9.0   PT/INR No results for input(s): LABPROT, INR in the last 72 hours.   Recent Labs Lab 05/29/14 1700 05/30/14 0614  AST 22 197*  ALT 18 186*  ALKPHOS 83 107  BILITOT 0.4 0.4  PROT 8.1 6.8  ALBUMIN 4.4 3.5     Lipase     Component Value Date/Time   LIPASE 26 05/29/2014 1700     Studies/Results: Dg Abd 2 Views  2014-06-22   CLINICAL DATA:  Small bowel obstruction  EXAM: ABDOMEN - 2 VIEW  COMPARISON:  05/30/2014  FINDINGS: Clear lung bases. No free air. NG tube has been removed. Prior cholecystectomy noted. There is less distention of the small bowel and contrast has migrated throughout the colon compatible resolving obstruction. Stable bilateral hip osteoarthritis.  IMPRESSION: Resolving small bowel obstruction.   Electronically Signed   By: Daryll Brod M.D.   On: 06-22-14 11:21    Medications: . antiseptic oral rinse  7 mL Mouth Rinse BID  . docusate sodium  100 mg Oral BID  . pantoprazole (PROTONIX) IV  40 mg Intravenous QHS     Assessment/Plan SBO Robot assisted laparoscopic radical prostatectomy7/29/15, prior appendectomy and cholecystectomy  02/09/2014  HYpertension Anemia Multiple myeloma BACK pain   Plan:  Advance diet to low residue diet, he can go when tolerating diet and OK with medicine.   LOS: 3 days    Bobette Leyh 06/01/2014

## 2014-06-01 NOTE — Discharge Instructions (Signed)
Small Bowel Obstruction °A small bowel obstruction is a blockage (obstruction) of the small intestine (small bowel). The small bowel is a long, slender tube that connects the stomach to the colon. Its job is to absorb nutrients from the fluids and foods you consume into the bloodstream.  °CAUSES  °There are many causes of intestinal blockage. The most common ones include: °· Hernias. This is a more common cause in children than adults. °· Inflammatory bowel disease (enteritis and colitis). °· Twisting of the bowel (volvulus). °· Tumors. °· Scar tissue (adhesions) from previous surgery or radiation treatment. °· Recent surgery. This may cause an acute small bowel obstruction called an ileus. °SYMPTOMS  °· Abdominal pain. This may be dull cramps or sharp pain. It may occur in one area or may be present in the entire abdomen. Pain can range from mild to severe, depending on the degree of obstruction. °· Nausea and vomiting. Vomit may be greenish or yellow bile color. °· Distended or swollen stomach. Abdominal bloating is a common symptom. °· Constipation. °· Lack of passing gas. °· Frequent belching. °· Diarrhea. This may occur if runny stool is able to leak around the obstruction. °DIAGNOSIS  °Your caregiver can usually diagnose small bowel obstruction by taking a history, doing a physical exam, and taking X-rays. If the cause is unclear, a CT scan (computerized tomography) of your abdomen and pelvis may be needed. °TREATMENT  °Treatment of the blockage depends on the cause and how bad the problem is.  °· Sometimes, the obstruction improves with bed rest and intravenous (IV) fluids. °· Resting the bowel is very important. This means following a simple diet. Sometimes, a clear liquid diet may be required for several days. °· Sometimes, a small tube (nasogastric tube) is placed into the stomach to decompress the bowel. When the bowel is blocked, it usually swells up like a balloon filled with air and fluids.  Decompression means that the air and fluids are removed by suction through that tube. This can help with pain, discomfort, and nausea. It can also help the obstruction resolve faster. °· Surgery may be required if other treatments do not work. Bowel obstruction from a hernia may require early surgery and can be an emergency procedure. Adhesions that cause frequent or severe obstructions may also require surgery. °HOME CARE INSTRUCTIONS °If your bowel obstruction is only partial or incomplete, you may be allowed to go home. °· Get plenty of rest. °· Follow your diet as directed by your caregiver. °· Only consume clear liquids until your condition improves. °· Avoid solid foods as instructed. °SEEK IMMEDIATE MEDICAL CARE IF: °· You have increased pain or cramping. °· You vomit blood. °· You have uncontrolled vomiting or nausea. °· You cannot drink fluids due to vomiting or pain. °· You develop confusion. °· You begin feeling very dry or thirsty (dehydrated). °· You have severe bloating. °· You have chills. °· You have a fever. °· You feel extremely weak or you faint. °MAKE SURE YOU: °· Understand these instructions. °· Will watch your condition. °· Will get help right away if you are not doing well or get worse. °Document Released: 09/17/2005 Document Revised: 09/23/2011 Document Reviewed: 09/14/2010 °ExitCare® Patient Information ©2015 [employer]. This information is not intended to replace advice given to you by your health care provider. Make sure you discuss any questions you have with your health care provider. ° °

## 2014-06-01 NOTE — Clinical Documentation Improvement (Signed)
  AST on admission - 22 Repeat 05/30/14 - 197  ALT on admission - 18 Repeat 05/30/14 - 186  If clinically appropriate, please document the clinical significance of the abnormal AST and ALT results.  Thank You, Erling Conte ,RN Clinical Documentation Specialist:  [phone] [place] Information Management

## 2014-06-01 NOTE — Progress Notes (Signed)
Pt has tolerated his late soft diet breakfast without difficulty. He was more concerned with the ham on his plate then pain or nausea. Will cont to monitor.

## 2014-06-01 NOTE — Progress Notes (Signed)
Key Points: Use following P&T approved IV to PO medication change policy.  Description contains the criteria that are approved Note: Policy Excludes:  Esophagectomy patientsPHARMACIST - PHYSICIAN COMMUNICATION DR:   Charlies Silvers CONCERNING: IV to Oral Route Change Policy  RECOMMENDATION: This patient is receiving Protonix by the intravenous route.  Based on criteria approved by the Pharmacy and Therapeutics Committee, the intravenous medication(s) is/are being converted to the equivalent oral dose form(s).   DESCRIPTION: These criteria include:  The patient is eating (either orally or via tube) and/or has been taking other orally administered medications for a least 24 hours  The patient has no evidence of active gastrointestinal bleeding or impaired GI absorption (gastrectomy, short bowel, patient on TNA or NPO).  If you have questions about this conversion, please contact the Pharmacy Department  []   [phone] )  [facility] []   [phone] )  [facility]  []   [phone] )  [facility] [x]   [phone] )  [place], [place], [facility] 06/01/2014 1:07 PM

## 2014-06-01 NOTE — Discharge Summary (Signed)
Physician Discharge Summary  Travis Nguyen XNT:[id number] DOB: 09/20/1950 DOA: 05/29/2014  PCP: [name], MD  Admit date: 05/29/2014 Discharge date: 06/01/2014  Recommendations for Outpatient Follow-up:  1. Follow-up with primary care physician in one to 2 weeks after discharge to make sure symptoms are stable.  Discharge Diagnoses:  Principal Problem:   SBO (small bowel obstruction) Active Problems:   Essential hypertension    Discharge Condition: stable   Diet recommendation: as tolerated   History of present illness:  63 -year-old male with past medical history of prostate cancer, status post prostatectomy done laparoscopically, MGUS, history of small bowel obstruction, hypertension who presented to [facility] ED 05/29/2014 with worsening abdominal girth, distention, nausea and vomiting. On admission, CT abdomen showed a high grade small bowel obstruction in the distal jejunum and proximal ileum. Patient was admitted for management of small bowel obstruction, NG tube inserted. Surgery consulted.   Assessment/Plan:     Principal Problem: SBO (small bowel obstruction) - likely secondary to adhesions from previous surgeries. CT abdomen on admission showed high-grade small bowel obstruction in the distal jejunum and proximal ileum with a transition zone low in the pelvis adjacent to the bladder. - abdominal x-ray done 05/31/2014 shows resolving small bowel obstruction. - NG tube was removed 05/31/2014. Patient tolerated clear liquid diet and regular diet.  Active Problems: Essential hypertension - blood pressure at goal Thrombocytopenia - Platelet count on admission was within normal limits, 166 but dropped noted to 129 since admission. - no signs of bleeding. Lovenox was stopped and using SCDs for DVT prophylaxis.  - patient has history of prostate cancer and multiple myeloma. Although this could contribute to thrombocytopenia patient did have normal platelet count  on admission so most likely Lovenox contributing to dropping platelets. Anemia of chronic disease - Likely secondary to history of malignancy, prostate cancer and multiple myeloma - Hemoglobin was within normal limits on the admission. Repeat hemoglobin 12.5. No evidence of bleeding. - No current indications for transfusion.  DVT Prophylaxis  - SCD's bilaterally   Code Status: Full.  Family Communication: plan of care discussed with the patient     IV access:   Peripheral IV  Procedures and diagnostic studies:   Ct Abdomen Pelvis W Contrast 05/29/2014 Changes consistent with high-grade small bowel obstruction in the distal jejunum and proximal ileum with a transition zone low in the pelvis adjacent to the bladder. This is again suggestive of adhesions as no definitive mass lesion is noted. Chronic changes similar to that noted on the prior exam.    Dg Abd 2 Views 05/30/2014 No change in a partial small bowel obstruction.   Medical Consultants:   Surgery  Other Consultants:   None  IAnti-Infectives:    None  Signed:  Leisa Lenz, MD  [facility] 06/01/2014, 11:44 AM  Pager #: [phone]   Discharge Exam: Filed Vitals:   06/01/14 0500  BP: 140/81  Pulse: 66  Temp: 98.3 F (36.8 C)  Resp: 16   Filed Vitals:   05/30/14 2054 05/31/14 1338 05/31/14 2120 06/01/14 0500  BP: 133/76 153/83 132/77 140/81  Pulse: 70 81 67 66  Temp: 98.6 F (37 C) 98.5 F (36.9 C) 98.8 F (37.1 C) 98.3 F (36.8 C)  TempSrc: Oral Oral Oral Oral  Resp: 18 18 18 16   Height:      Weight:      SpO2: 98% 100% 100% 100%    General: Pt is alert, follows commands appropriately, not in acute distress Cardiovascular:  Regular rate and rhythm, S1/S2 +, no murmurs Respiratory: Clear to auscultation bilaterally, no wheezing, no crackles, no rhonchi Abdominal: non distended, bowel sounds +, no guarding Extremities: no edema, no cyanosis, pulses  palpable bilaterally DP and PT Neuro: Grossly nonfocal  Discharge Instructions  Discharge Instructions    Call MD for:  difficulty breathing, headache or visual disturbances    Complete by:  As directed      Call MD for:  persistant dizziness or light-headedness    Complete by:  As directed      Call MD for:  persistant nausea and vomiting    Complete by:  As directed      Call MD for:  severe uncontrolled pain    Complete by:  As directed      Diet - low sodium heart healthy    Complete by:  As directed      Increase activity slowly    Complete by:  As directed             Medication List    TAKE these medications        acetaminophen 500 MG tablet  Commonly known as:  TYLENOL  Take 500-1,000 mg by mouth every 6 (six) hours as needed (For pain.).     cyclobenzaprine 10 MG tablet  Commonly known as:  FLEXERIL  Take 1 tablet (10 mg total) by mouth 2 (two) times daily as needed for muscle spasms.     DSS 100 MG Caps  Take 100 mg by mouth 2 (two) times daily as needed for mild constipation.     lidocaine-prilocaine cream  Commonly known as:  EMLA  Apply 1 application topically daily as needed (Applies to port-a-cath.).     losartan 100 MG tablet  Commonly known as:  COZAAR  Take 100 mg by mouth every morning.     multivitamin with minerals Tabs tablet  Take 1 tablet by mouth every morning.     oxycodone 30 MG immediate release tablet  Commonly known as:  ROXICODONE  Take 30 mg by mouth every 6 (six) hours as needed for pain (For back pain.).     pantoprazole 40 MG tablet  Commonly known as:  PROTONIX  Take 1 tablet (40 mg total) by mouth daily.           Follow-up Information    Follow up with [name], MD. Schedule an appointment as soon as possible for a visit in 1 week.   Specialty:  Family Medicine   Why:  Follow up appt after recent hospitalization   Contact information:   [address], [place] [address] [phone]         The results of significant diagnostics from this hospitalization (including imaging, microbiology, ancillary and laboratory) are listed below for reference.    Significant Diagnostic Studies: Dg Cervical Spine Complete  05/23/2014   CLINICAL DATA:  Lateral left neck pain with no known trauma.  EXAM: CERVICAL SPINE  4+ VIEWS  COMPARISON:  Bone survey October 15, 2013  FINDINGS: There is no evidence of cervical spine fracture or prevertebral soft tissue swelling. There are degenerative joint changes with narrowed joint space and osteophyte formation in the mid to lower cervical spine. There are narrowing of the right C2-3, C3-4, C4-5, C5-6 and left C3-4, C6-7 neural foramina due to osteophyte encroachment. No other significant bone abnormalities are identified.  IMPRESSION: No acute fracture or dislocation. Degenerative joint changes of cervical spine.  Electronically Signed   By: Abelardo Diesel M.D.   On: 05/23/2014 08:49   Ct Abdomen Pelvis W Contrast  05/29/2014   CLINICAL DATA:  Periumbilical pain, personal history of multiple myeloma.  EXAM: CT ABDOMEN AND PELVIS WITH CONTRAST  TECHNIQUE: Multidetector CT imaging of the abdomen and pelvis was performed using the  Standard protocol following bolus administration of intravenous contrast.  CONTRAST:  92m OMNIPAQUE IOHEXOL 300 MG/ML SOLN, 1011mOMNIPAQUE IOHEXOL 300 MG/ML SOLN  COMPARISON:  03/18/2014  FINDINGS: Lung bases are free of acute infiltrate or sizable effusion.  A hypodense lesion with peripheral nodular enhancement is noted in the right lobe of the liver stable from the prior exam consistent with a hepatic hemangioma. Tiny hypodensity is noted to the tip of the liver on the not well characterized on this examination. The gallbladder is been surgically removed. Mild hepatic biliary dilatation is noted. The spleen, adrenal glands and pancreas are normal in their CT appearance. The kidneys are well visualized bilaterally and reveal no renal  calculi or obstructive changes. Bilateral renal cystic change is again seen.  There multiple dilated loops of small bowel which arise in the distal aspect of the jejunum and extend into the ileum. These continue to just adjacent to the urinary bladder and on image number 76 of series 2 a focal transitional zone is noted. The more distal ileum is within normal limits. This transition zone is in a similar location to that seen on the prior exam. These changes are again suggestive of adhesions as no definitive mass lesion is noted.  The bladder is partially distended. No pelvic mass lesion is seen. The appendix is not visualized consistent with a prior surgical history. No acute bony abnormality is noted.  IMPRESSION: Changes consistent with high-grade small bowel obstruction in the distal jejunum and proximal ileum with a transition zone low in the pelvis adjacent to the bladder. This is again suggestive of adhesions as no definitive mass lesion is noted.  Chronic changes similar to that noted on the prior exam.   Electronically Signed   By: MaInez Catalina.D.   On: 05/29/2014 18:48   Dg Abd 2 Views  05/31/2014   CLINICAL DATA:  Small bowel obstruction  EXAM: ABDOMEN - 2 VIEW  COMPARISON:  05/30/2014  FINDINGS: Clear lung bases. No free air. NG tube has been removed. Prior cholecystectomy noted. There is less distention of the small bowel and contrast has migrated throughout the colon compatible resolving obstruction. Stable bilateral hip osteoarthritis.  IMPRESSION: Resolving small bowel obstruction.   Electronically Signed   By: TrDaryll Brod.D.   On: 05/31/2014 11:21   Dg Abd 2 Views  05/30/2014   CLINICAL DATA:  Followup of abdominal distention and small bowel obstruction.  EXAM: ABDOMEN - 2 VIEW  COMPARISON:  CT 1 day prior  FINDINGS: Upright and supine views. Nasogastric tube which terminates at the body of the stomach. No free intraperitoneal air. Small bowel distention with an index mid abdominal loop  measuring 4.8 cm. Similar to on the prior, given across modality comparison. Multiple small bowel air-fluid levels. Stool and gas remain within the colon. Contrast within the urinary bladder from yesterday's CT. Bilateral hip osteoarthritis.  IMPRESSION: No change in a partial small bowel obstruction.   Electronically Signed   By: KyAbigail Miyamoto.D.   On: 05/30/2014 08:39    Microbiology: No results found for this or any previous visit (from the past 240 hour(s)).   Labs: Basic  Metabolic Panel:  Recent Labs Lab 05/29/14 1700 05/30/14 0614  NA 139 138  K 4.9 4.3  CL 99 102  CO2 28 27  GLUCOSE 95 98  BUN 14 12  CREATININE 0.94 0.83  CALCIUM 10.2 9.0  MG  --  2.2  PHOS  --  3.6   Liver Function Tests:  Recent Labs Lab 05/29/14 1700 05/30/14 0614  AST 22 197*  ALT 18 186*  ALKPHOS 83 107  BILITOT 0.4 0.4  PROT 8.1 6.8  ALBUMIN 4.4 3.5    Recent Labs Lab 05/29/14 1700  LIPASE 26   No results for input(s): AMMONIA in the last 168 hours. CBC:  Recent Labs Lab 05/29/14 1700 05/30/14 0614  WBC 4.8 3.9*  NEUTROABS 3.0  --   HGB 14.0 12.5*  HCT 41.5 36.6*  MCV 92.2 92.4  PLT 166 129*   Cardiac Enzymes: No results for input(s): CKTOTAL, CKMB, CKMBINDEX, TROPONINI in the last 168 hours. BNP: BNP (last 3 results) No results for input(s): PROBNP in the last 8760 hours. CBG: No results for input(s): GLUCAP in the last 168 hours.  Time coordinating discharge: Over 30 minutes

## 2014-06-03 ENCOUNTER — Telehealth: Payer: Self-pay | Admitting: Oncology

## 2014-06-03 NOTE — Telephone Encounter (Signed)
returned call s.w. pt and advised on appt....pt ok and aware

## 2014-06-06 ENCOUNTER — Telehealth: Payer: Self-pay | Admitting: Oncology

## 2014-06-06 NOTE — Telephone Encounter (Signed)
Pt called to r/s due to being in [place] r/s from 11/25 to 12/01. This was on 11/19 forgot to notate also mailed pt updated schedule.... KJ

## 2014-06-08 ENCOUNTER — Other Ambulatory Visit: Payer: No Typology Code available for payment source

## 2014-06-08 ENCOUNTER — Ambulatory Visit: Payer: No Typology Code available for payment source | Admitting: Oncology

## 2014-06-14 ENCOUNTER — Ambulatory Visit (HOSPITAL_BASED_OUTPATIENT_CLINIC_OR_DEPARTMENT_OTHER): Payer: Medicaid Other

## 2014-06-14 ENCOUNTER — Other Ambulatory Visit (HOSPITAL_BASED_OUTPATIENT_CLINIC_OR_DEPARTMENT_OTHER): Payer: Medicaid Other

## 2014-06-14 ENCOUNTER — Ambulatory Visit (HOSPITAL_BASED_OUTPATIENT_CLINIC_OR_DEPARTMENT_OTHER): Payer: Medicaid Other | Admitting: Oncology

## 2014-06-14 ENCOUNTER — Telehealth: Payer: Self-pay | Admitting: Oncology

## 2014-06-14 VITALS — BP 155/86 | HR 72 | Temp 98.7°F | Resp 18 | Ht 73.5 in | Wt 156.4 lb

## 2014-06-14 DIAGNOSIS — M549 Dorsalgia, unspecified: Secondary | ICD-10-CM

## 2014-06-14 DIAGNOSIS — C9000 Multiple myeloma not having achieved remission: Secondary | ICD-10-CM

## 2014-06-14 DIAGNOSIS — C61 Malignant neoplasm of prostate: Secondary | ICD-10-CM

## 2014-06-14 DIAGNOSIS — Z95828 Presence of other vascular implants and grafts: Secondary | ICD-10-CM

## 2014-06-14 LAB — COMPREHENSIVE METABOLIC PANEL (CC13)
ALBUMIN: 3.8 g/dL (ref 3.5–5.0)
ALT: 23 U/L (ref 0–55)
ANION GAP: 9 meq/L (ref 3–11)
AST: 20 U/L (ref 5–34)
Alkaline Phosphatase: 94 U/L (ref 40–150)
BUN: 13.6 mg/dL (ref 7.0–26.0)
CALCIUM: 9.3 mg/dL (ref 8.4–10.4)
CHLORIDE: 104 meq/L (ref 98–109)
CO2: 26 meq/L (ref 22–29)
Creatinine: 0.8 mg/dL (ref 0.7–1.3)
GLUCOSE: 97 mg/dL (ref 70–140)
POTASSIUM: 3.9 meq/L (ref 3.5–5.1)
SODIUM: 139 meq/L (ref 136–145)
TOTAL PROTEIN: 7.1 g/dL (ref 6.4–8.3)
Total Bilirubin: 0.31 mg/dL (ref 0.20–1.20)

## 2014-06-14 LAB — CBC WITH DIFFERENTIAL/PLATELET
BASO%: 0.2 % (ref 0.0–2.0)
Basophils Absolute: 0 10*3/uL (ref 0.0–0.1)
EOS ABS: 0 10*3/uL (ref 0.0–0.5)
EOS%: 0.7 % (ref 0.0–7.0)
HCT: 34.9 % — ABNORMAL LOW (ref 38.4–49.9)
HGB: 11.8 g/dL — ABNORMAL LOW (ref 13.0–17.1)
LYMPH#: 1.7 10*3/uL (ref 0.9–3.3)
LYMPH%: 40.1 % (ref 14.0–49.0)
MCH: 31.3 pg (ref 27.2–33.4)
MCHC: 33.8 g/dL (ref 32.0–36.0)
MCV: 92.6 fL (ref 79.3–98.0)
MONO#: 0.3 10*3/uL (ref 0.1–0.9)
MONO%: 8.1 % (ref 0.0–14.0)
NEUT#: 2.1 10*3/uL (ref 1.5–6.5)
NEUT%: 50.9 % (ref 39.0–75.0)
PLATELETS: 183 10*3/uL (ref 140–400)
RBC: 3.77 10*6/uL — AB (ref 4.20–5.82)
RDW: 13.4 % (ref 11.0–14.6)
WBC: 4.2 10*3/uL (ref 4.0–10.3)

## 2014-06-14 MED ORDER — SODIUM CHLORIDE 0.9 % IJ SOLN
10.0000 mL | INTRAMUSCULAR | Status: DC | PRN
  Administered 2014-06-14: 10 mL via INTRAVENOUS
  Filled 2014-06-14: qty 10

## 2014-06-14 MED ORDER — HEPARIN SOD (PORK) LOCK FLUSH 100 UNIT/ML IV SOLN
500.0000 [IU] | Freq: Once | INTRAVENOUS | Status: AC
  Administered 2014-06-14: 500 [IU] via INTRAVENOUS
  Filled 2014-06-14: qty 5

## 2014-06-14 NOTE — Progress Notes (Signed)
Hematology and Oncology Follow Up Visit  Travis Nguyen [id number] 04/24/1951 63 y.o. 06/14/2014 3:51 PM Travis Nguyen, MDSwayne, Leonie Green, MD   Principle Diagnosis: 63 year old gentleman with the following diagnoses:  1. 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 Initially at smoldering subtype of 11% plasma cell in the bone marrow. He subsequently developed active multiple myeloma in February of 2014 with a 23% plasma cell infiltration. 2. Prostate cancer diagnosed in May of 2014. He presented with an elevated PSA of 13.2 stage TI C. clinical staging. His Gleason score was 3+4 equals 7.  Prior Therapy: He was treated initially with Cytoxan, Velcade and Decadron and subsequently his regimen changed to a Velcade, Revlimid with dexamethasone. He had an excellent response with that an M spike that is no longer detected an IgG level within normal range at April 2015. Of note, he did not have any poor prognostic features on his cytogenetics.   He is status post a robotic-assisted laparoscopic radical prostatectomy and bilateral lymph node dissection on 02/09/2014. The final pathology showed prostate adenocarcinoma Gleason score 4+3 equals 7 involving both lobes. Extraprostatic extension was present. The bladder neck margin was negative. Zero out of two lymph nodes were involved.  Current therapy:  He is awaiting consolidative autologous stem cell transplant  Interim History:  Travis Nguyen presents today for a followup visit. Since the last visit, he was hospitalized for small bowel obstruction  that resolved spontaneously and was discharged on 06/01/2014 with conservative management. He continues to be off Revlimid since October 2015 due to constipation. He was seen at [facility] on 06/13/2014 for evaluation for autologous stem cell transplant. He still has some incontinence issue that has improved. Otherwise he is completely asymptomatic at this point. He is eating well and have  excellent quality of life. He did not report any fevers or chills or sweats. Does not report any hematuria or dysuria. Does not report any headaches or blurry vision or double vision. Does not report any syncope or seizures. He does not report any skeletal complaints of arthralgias or myalgias. He does not report any chest pain, shortness of breath, palpitation orthopnea or PND. He does not report any shortness of breath or wheezing. His performance status and activity level remains excellent. Rest of his review of systems unremarkable.  Medications: I have reviewed the patient's current medications.  Current Outpatient Prescriptions  Medication Sig Dispense Refill  . acetaminophen (TYLENOL) 500 MG tablet Take 500-1,000 mg by mouth every 6 (six) hours as needed (For pain.).    [name] cyclobenzaprine (FLEXERIL) 10 MG tablet Take 1 tablet (10 mg total) by mouth 2 (two) times daily as needed for muscle spasms. 20 tablet 0  . docusate sodium 100 MG CAPS Take 100 mg by mouth 2 (two) times daily as needed for mild constipation. 10 capsule 0  . lidocaine-prilocaine (EMLA) cream Apply 1 application topically daily as needed (Applies to port-a-cath.).    [name] losartan (COZAAR) 100 MG tablet Take 100 mg by mouth every morning.     . Multiple Vitamin (MULTIVITAMIN WITH MINERALS) TABS tablet Take 1 tablet by mouth every morning.    [name] oxycodone (ROXICODONE) 30 MG immediate release tablet Take 30 mg by mouth every 6 (six) hours as needed for pain (For back pain.).     [name] pantoprazole (PROTONIX) 40 MG tablet Take 1 tablet (40 mg total) by mouth daily. 30 tablet 0   No current facility-administered medications for this visit.  Allergies: No Known Allergies  Past Medical History, Surgical history, Social history, and Family History were reviewed and updated.   Physical Exam: Blood pressure 155/86, pulse 72, temperature 98.7 F (37.1 C), temperature source Oral, resp. rate 18, height 6' 1.5" (1.867 m), weight 156  lb 6.4 oz (70.943 kg). ECOG: 1 General appearance: alert and cooperative Head: Normocephalic, without obvious abnormality Neck: no adenopathy Lymph nodes: Cervical, supraclavicular, and axillary nodes normal. Heart:regular rate and rhythm, S1, S2 normal, no murmur, click, rub or gallop Lung:chest clear, no wheezing, rales, normal symmetric air entry Abdomin: soft, non-tender, without masses or organomegaly EXT:no erythema, induration, or nodules   Lab Results: Lab Results  Component Value Date   WBC 4.2 06/14/2014   HGB 11.8* 06/14/2014   HCT 34.9* 06/14/2014   MCV 92.6 06/14/2014   PLT 183 06/14/2014     Chemistry      Component Value Date/Time   NA 138 05/30/2014 0614   NA 139 04/19/2014 0831   K 4.3 05/30/2014 0614   K 4.0 04/19/2014 0831   CL 102 05/30/2014 0614   CL 105 01/01/2013 0835   CO2 27 05/30/2014 0614   CO2 27 04/19/2014 0831   BUN 12 05/30/2014 0614   BUN 10.9 04/19/2014 0831   CREATININE 0.83 05/30/2014 0614   CREATININE 0.8 04/19/2014 0831   CREATININE (Revised) 0.92 07/24/2009 1449      Component Value Date/Time   CALCIUM 9.0 05/30/2014 0614   CALCIUM 9.1 04/19/2014 0831   ALKPHOS 107 05/30/2014 0614   ALKPHOS 86 04/19/2014 0831   AST 197* 05/30/2014 0614   AST 19 04/19/2014 0831   ALT 186* 05/30/2014 0614   ALT 24 04/19/2014 0831   BILITOT 0.4 05/30/2014 0614   BILITOT 0.29 04/19/2014 0831        Impression and Plan:  63 year old gentleman with the following issues  1.IgG Kappa Multiple Myeloma: His last protein studies on 03/09/2014 showed complete response without any evidence of an M spike. His quantitative immunoglobulins are all within normal range. He has been off Revlimid since his episode of small bowel obstruction in October 2015. He is currently under evaluation for high-dose chemotherapy and autologous stem cell transplantation. I anticipate this will take place and of December and will arrange follow-up for him in 3 months to  check on his status.     2. Prostate cancer: He is status post prostatectomy on 02/09/2014. His pathology showed a Gleason score 4+3 equals 7 with disease involving both lobes. He has extraprostatic extension but no lymph node involvement. His last PSA is undetectable without any evidence of disease.  3. Chronic back pain: He is on oxycodone every 12 hours.  4. Followup: Will be in 3 months after his transplant and recovery.   SKAJGO,TLXBW, MD 12/1/20153:51 PM

## 2014-06-14 NOTE — Patient Instructions (Signed)

## 2014-06-14 NOTE — Telephone Encounter (Signed)
Pt confirmed labs/ov per 12/01 POF, mailed pt schedule.... KJ

## 2014-06-17 LAB — SPEP & IFE WITH QIG
ALPHA-2-GLOBULIN: 11.5 % (ref 7.1–11.8)
Albumin ELP: 58.4 % (ref 55.8–66.1)
Alpha-1-Globulin: 3.9 % (ref 2.9–4.9)
BETA GLOBULIN: 6.1 % (ref 4.7–7.2)
Beta 2: 4.7 % (ref 3.2–6.5)
GAMMA GLOBULIN: 15.4 % (ref 11.1–18.8)
IGG (IMMUNOGLOBIN G), SERUM: 1020 mg/dL (ref 650–1600)
IgA: 141 mg/dL (ref 68–379)
IgM, Serum: 83 mg/dL (ref 41–251)
Total Protein, Serum Electrophoresis: 6.9 g/dL (ref 6.0–8.3)

## 2014-06-17 LAB — KAPPA/LAMBDA LIGHT CHAINS
KAPPA FREE LGHT CHN: 1.34 mg/dL (ref 0.33–1.94)
Kappa:Lambda Ratio: 1.38 (ref 0.26–1.65)
Lambda Free Lght Chn: 0.97 mg/dL (ref 0.57–2.63)

## 2014-07-18 ENCOUNTER — Telehealth: Payer: Self-pay | Admitting: *Deleted

## 2014-07-18 NOTE — Telephone Encounter (Signed)
PT. IS TO HAVE A AUTOLOGOUS STEM CELL TRANSPLANT AT [facility]. IT APPEARS THE QUESTIONS NEED TO BE ANSWERED BY THE PROVIDER AT [facility]. INFORMED PT. OF THE ABOVE. HE WILL CONTACT MEDICAID FOR CLARIFICATION.

## 2014-07-22 ENCOUNTER — Ambulatory Visit
Admission: RE | Admit: 2014-07-22 | Discharge: 2014-07-22 | Disposition: A | Discharge status: Home / Self Care | Payer: Medicaid Other | Source: Ambulatory Visit | Attending: Physician Assistant | Admitting: Physician Assistant

## 2014-07-22 ENCOUNTER — Other Ambulatory Visit: Payer: Self-pay | Admitting: Physician Assistant

## 2014-07-22 DIAGNOSIS — K56609 Unspecified intestinal obstruction, unspecified as to partial versus complete obstruction: Secondary | ICD-10-CM

## 2014-07-24 ENCOUNTER — Emergency Department (HOSPITAL_COMMUNITY)
Admission: EM | Admit: 2014-07-24 | Discharge: 2014-07-24 | Disposition: A | Discharge status: Home / Self Care | Payer: Medicaid Other | Attending: Emergency Medicine | Admitting: Emergency Medicine

## 2014-07-24 ENCOUNTER — Emergency Department (HOSPITAL_COMMUNITY): Payer: Medicaid Other

## 2014-07-24 ENCOUNTER — Encounter (HOSPITAL_COMMUNITY): Payer: Self-pay | Admitting: *Deleted

## 2014-07-24 DIAGNOSIS — Z85820 Personal history of malignant melanoma of skin: Secondary | ICD-10-CM | POA: Insufficient documentation

## 2014-07-24 DIAGNOSIS — Z87891 Personal history of nicotine dependence: Secondary | ICD-10-CM | POA: Insufficient documentation

## 2014-07-24 DIAGNOSIS — R05 Cough: Secondary | ICD-10-CM | POA: Diagnosis present

## 2014-07-24 DIAGNOSIS — R011 Cardiac murmur, unspecified: Secondary | ICD-10-CM | POA: Insufficient documentation

## 2014-07-24 DIAGNOSIS — Z8739 Personal history of other diseases of the musculoskeletal system and connective tissue: Secondary | ICD-10-CM | POA: Diagnosis not present

## 2014-07-24 DIAGNOSIS — Z8701 Personal history of pneumonia (recurrent): Secondary | ICD-10-CM | POA: Diagnosis not present

## 2014-07-24 DIAGNOSIS — D649 Anemia, unspecified: Secondary | ICD-10-CM | POA: Insufficient documentation

## 2014-07-24 DIAGNOSIS — J069 Acute upper respiratory infection, unspecified: Secondary | ICD-10-CM | POA: Insufficient documentation

## 2014-07-24 DIAGNOSIS — I10 Essential (primary) hypertension: Secondary | ICD-10-CM | POA: Diagnosis not present

## 2014-07-24 DIAGNOSIS — Z8579 Personal history of other malignant neoplasms of lymphoid, hematopoietic and related tissues: Secondary | ICD-10-CM | POA: Insufficient documentation

## 2014-07-24 DIAGNOSIS — Z79899 Other long term (current) drug therapy: Secondary | ICD-10-CM | POA: Insufficient documentation

## 2014-07-24 DIAGNOSIS — Z8719 Personal history of other diseases of the digestive system: Secondary | ICD-10-CM | POA: Insufficient documentation

## 2014-07-24 DIAGNOSIS — Z8546 Personal history of malignant neoplasm of prostate: Secondary | ICD-10-CM | POA: Insufficient documentation

## 2014-07-24 DIAGNOSIS — R0602 Shortness of breath: Secondary | ICD-10-CM

## 2014-07-24 MED ORDER — BENZONATATE 100 MG PO CAPS: 100.0000 mg | ORAL_CAPSULE | Freq: Three times a day (TID) | ORAL | Status: DC

## 2014-07-24 NOTE — Discharge Instructions (Signed)
Cough, Adult   A cough is a reflex. It helps you clear your throat and airways. A cough can help heal your body. A cough can last 2 or 3 weeks (acute) or may last more than 8 weeks (chronic). Some common causes of a cough can include an infection, allergy, or a cold.  HOME CARE  · Only take medicine as told by your doctor.  · If given, take your medicines (antibiotics) as told. Finish them even if you start to feel better.  · Use a cold steam vaporizer or humidifier in your home. This can help loosen thick spit (secretions).  · Sleep so you are almost sitting up (semi-upright). Use pillows to do this. This helps reduce coughing.  · Rest as needed.  · Stop smoking if you smoke.  GET HELP RIGHT AWAY IF:  · You have yellowish-white fluid (pus) in your thick spit.  · Your cough gets worse.  · Your medicine does not reduce coughing, and you are losing sleep.  · You cough up blood.  · You have trouble breathing.  · Your pain gets worse and medicine does not help.  · You have a fever.  MAKE SURE YOU:   · Understand these instructions.  · Will watch your condition.  · Will get help right away if you are not doing well or get worse.  Document Released: 03/14/2011 Document Revised: 11/15/2013 Document Reviewed: 03/14/2011  Exit[employer]® Patient Information ©2015 [employer]. This information is not intended to replace advice given to you by your health care provider. Make sure you discuss any questions you have with your health care provider.

## 2014-07-24 NOTE — ED Notes (Signed)
The pt is c/o ciold and cough fir 2 weeks.  He thinks he has had the flu  [place] 2 weeks
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
## 2014-08-04 ENCOUNTER — Emergency Department (HOSPITAL_COMMUNITY)
Admission: EM | Admit: 2014-08-04 | Discharge: 2014-08-04 | Disposition: A | Discharge status: Home / Self Care | Payer: Medicaid Other | Attending: Emergency Medicine | Admitting: Emergency Medicine

## 2014-08-04 ENCOUNTER — Encounter (HOSPITAL_COMMUNITY): Payer: Self-pay

## 2014-08-04 ENCOUNTER — Emergency Department (HOSPITAL_COMMUNITY): Payer: Medicaid Other

## 2014-08-04 DIAGNOSIS — I10 Essential (primary) hypertension: Secondary | ICD-10-CM | POA: Diagnosis not present

## 2014-08-04 DIAGNOSIS — Z87891 Personal history of nicotine dependence: Secondary | ICD-10-CM | POA: Diagnosis not present

## 2014-08-04 DIAGNOSIS — Z8546 Personal history of malignant neoplasm of prostate: Secondary | ICD-10-CM | POA: Diagnosis not present

## 2014-08-04 DIAGNOSIS — D649 Anemia, unspecified: Secondary | ICD-10-CM | POA: Insufficient documentation

## 2014-08-04 DIAGNOSIS — Z79899 Other long term (current) drug therapy: Secondary | ICD-10-CM | POA: Insufficient documentation

## 2014-08-04 DIAGNOSIS — Z85820 Personal history of malignant melanoma of skin: Secondary | ICD-10-CM | POA: Diagnosis not present

## 2014-08-04 DIAGNOSIS — R011 Cardiac murmur, unspecified: Secondary | ICD-10-CM | POA: Insufficient documentation

## 2014-08-04 DIAGNOSIS — Z8719 Personal history of other diseases of the digestive system: Secondary | ICD-10-CM | POA: Insufficient documentation

## 2014-08-04 DIAGNOSIS — R059 Cough, unspecified: Secondary | ICD-10-CM

## 2014-08-04 DIAGNOSIS — Z8579 Personal history of other malignant neoplasms of lymphoid, hematopoietic and related tissues: Secondary | ICD-10-CM | POA: Diagnosis not present

## 2014-08-04 DIAGNOSIS — R05 Cough: Secondary | ICD-10-CM | POA: Insufficient documentation

## 2014-08-04 DIAGNOSIS — Z8701 Personal history of pneumonia (recurrent): Secondary | ICD-10-CM | POA: Diagnosis not present

## 2014-08-04 DIAGNOSIS — Z8739 Personal history of other diseases of the musculoskeletal system and connective tissue: Secondary | ICD-10-CM | POA: Diagnosis not present

## 2014-08-04 LAB — BASIC METABOLIC PANEL
ANION GAP: 6 (ref 5–15)
BUN: 15 mg/dL (ref 6–23)
CALCIUM: 9 mg/dL (ref 8.4–10.5)
CO2: 26 mmol/L (ref 19–32)
Chloride: 106 mEq/L (ref 96–112)
Creatinine, Ser: 0.85 mg/dL (ref 0.50–1.35)
GFR calc non Af Amer: >90 mL/min (ref >90)
GLUCOSE: 79 mg/dL (ref 70–99)
Potassium: 4 mmol/L (ref 3.5–5.1)
Sodium: 138 mmol/L (ref 135–145)

## 2014-08-04 LAB — CBC WITH DIFFERENTIAL/PLATELET
BASOS PCT: 1 % (ref 0–1)
Basophils Absolute: 0 10*3/uL (ref 0.0–0.1)
EOS PCT: 2 % (ref 0–5)
Eosinophils Absolute: 0.1 10*3/uL (ref 0.0–0.7)
HCT: 36.4 % — ABNORMAL LOW (ref 39.0–52.0)
Hemoglobin: 12.1 g/dL — ABNORMAL LOW (ref 13.0–17.0)
LYMPHS ABS: 1.8 10*3/uL (ref 0.7–4.0)
Lymphocytes Relative: 44 % (ref 12–46)
MCH: 31.9 pg (ref 26.0–34.0)
MCHC: 33.2 g/dL (ref 30.0–36.0)
MCV: 96 fL (ref 78.0–100.0)
Monocytes Absolute: 0.3 10*3/uL (ref 0.1–1.0)
Monocytes Relative: 7 % (ref 3–12)
NEUTROS PCT: 46 % (ref 43–77)
Neutro Abs: 1.9 10*3/uL (ref 1.7–7.7)
Platelets: 178 10*3/uL (ref 150–400)
RBC: 3.79 MIL/uL — ABNORMAL LOW (ref 4.22–5.81)
RDW: 13 % (ref 11.5–15.5)
WBC: 4 10*3/uL (ref 4.0–10.5)

## 2014-08-04 LAB — BRAIN NATRIURETIC PEPTIDE: B Natriuretic Peptide: 44.1 pg/mL (ref 0.0–100.0)

## 2014-08-04 LAB — D-DIMER, QUANTITATIVE (NOT AT ARMC): D DIMER QUANT: 0.41 ug{FEU}/mL (ref 0.00–0.48)

## 2014-08-04 MED ORDER — CETIRIZINE HCL 10 MG PO TABS: 10.0000 mg | ORAL_TABLET | Freq: Every day | ORAL | Status: DC

## 2014-08-04 MED ORDER — HEPARIN SOD (PORK) LOCK FLUSH 100 UNIT/ML IV SOLN
500.0000 [IU] | Freq: Once | INTRAVENOUS | Status: AC
  Administered 2014-08-04: 500 [IU]
  Filled 2014-08-04: qty 5

## 2014-08-04 NOTE — Discharge Instructions (Signed)
Cough, Adult  A cough is a reflex that helps clear your throat and airways. It can help heal the body or may be a reaction to an irritated airway. A cough may only last 2 or 3 weeks (acute) or may last more than 8 weeks (chronic).  CAUSES Acute cough:  Viral or bacterial infections. Chronic cough:  Infections.  Allergies.  Asthma.  Post-nasal drip.  Smoking.  Heartburn or acid reflux.  Some medicines.  Chronic lung problems (COPD).  Cancer. SYMPTOMS   Cough.  Fever.  Chest pain.  Increased breathing rate.  High-pitched whistling sound when breathing (wheezing).  Colored mucus that you cough up (sputum). TREATMENT   A bacterial cough may be treated with antibiotic medicine.  A viral cough must run its course and will not respond to antibiotics.  Your caregiver may recommend other treatments if you have a chronic cough. HOME CARE INSTRUCTIONS   Only take over-the-counter or prescription medicines for pain, discomfort, or fever as directed by your caregiver. Use cough suppressants only as directed by your caregiver.  Use a cold steam vaporizer or humidifier in your bedroom or home to help loosen secretions.  Sleep in a semi-upright position if your cough is worse at night.  Rest as needed.  Stop smoking if you smoke. SEEK IMMEDIATE MEDICAL CARE IF:   You have pus in your sputum.  Your cough starts to worsen.  You cannot control your cough with suppressants and are losing sleep.  You begin coughing up blood.  You have difficulty breathing.  You develop pain which is getting worse or is uncontrolled with medicine.  You have a fever. MAKE SURE YOU:   Understand these instructions.  Will watch your condition.  Will get help right away if you are not doing well or get worse. Document Released: 12/28/2010 Document Revised: 09/23/2011 Document Reviewed: 12/28/2010 Exit[employer] Patient Information 2015 [employer]. This information is not intended  to replace advice given to you by your health care provider. Make sure you discuss any questions you have with your health care provider.  

## 2014-08-04 NOTE — ED Notes (Signed)
Delay in discharge pending x-ray

## 2014-08-04 NOTE — ED Notes (Signed)
Pt c/o cough and chest congestions x 1 month.  Denies pain.  Sts he is coughing up yellow mucus.  Pt was seen at [facility] recently for same.  Sts he took prescriptions w/o relief.  Hx of bone CA.  Pt sts last CA treatment was "a long time ago, because I'm in remission."

## 2014-08-04 NOTE — ED Notes (Signed)
MD at bedside. 
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
## 2014-08-21 ENCOUNTER — Encounter (HOSPITAL_COMMUNITY): Payer: Self-pay | Admitting: *Deleted

## 2014-08-21 ENCOUNTER — Emergency Department (HOSPITAL_COMMUNITY)
Admission: EM | Admit: 2014-08-21 | Discharge: 2014-08-21 | Disposition: A | Discharge status: Home / Self Care | Payer: Medicaid Other | Attending: Emergency Medicine | Admitting: Emergency Medicine

## 2014-08-21 DIAGNOSIS — J3489 Other specified disorders of nose and nasal sinuses: Secondary | ICD-10-CM | POA: Diagnosis not present

## 2014-08-21 DIAGNOSIS — Z8546 Personal history of malignant neoplasm of prostate: Secondary | ICD-10-CM | POA: Insufficient documentation

## 2014-08-21 DIAGNOSIS — Z8579 Personal history of other malignant neoplasms of lymphoid, hematopoietic and related tissues: Secondary | ICD-10-CM | POA: Diagnosis not present

## 2014-08-21 DIAGNOSIS — D649 Anemia, unspecified: Secondary | ICD-10-CM | POA: Diagnosis not present

## 2014-08-21 DIAGNOSIS — R011 Cardiac murmur, unspecified: Secondary | ICD-10-CM | POA: Diagnosis not present

## 2014-08-21 DIAGNOSIS — Z85820 Personal history of malignant melanoma of skin: Secondary | ICD-10-CM | POA: Diagnosis not present

## 2014-08-21 DIAGNOSIS — Z8701 Personal history of pneumonia (recurrent): Secondary | ICD-10-CM | POA: Insufficient documentation

## 2014-08-21 DIAGNOSIS — R0981 Nasal congestion: Secondary | ICD-10-CM | POA: Diagnosis not present

## 2014-08-21 DIAGNOSIS — H6122 Impacted cerumen, left ear: Secondary | ICD-10-CM | POA: Diagnosis not present

## 2014-08-21 DIAGNOSIS — I10 Essential (primary) hypertension: Secondary | ICD-10-CM | POA: Diagnosis not present

## 2014-08-21 DIAGNOSIS — Z87891 Personal history of nicotine dependence: Secondary | ICD-10-CM | POA: Insufficient documentation

## 2014-08-21 DIAGNOSIS — Z8719 Personal history of other diseases of the digestive system: Secondary | ICD-10-CM | POA: Insufficient documentation

## 2014-08-21 DIAGNOSIS — Z8739 Personal history of other diseases of the musculoskeletal system and connective tissue: Secondary | ICD-10-CM | POA: Diagnosis not present

## 2014-08-21 DIAGNOSIS — Z79899 Other long term (current) drug therapy: Secondary | ICD-10-CM | POA: Diagnosis not present

## 2014-08-21 MED ORDER — OXYMETAZOLINE HCL 0.05 % NA SOLN: 1.0000 | Freq: Two times a day (BID) | NASAL | Status: DC
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
## 2014-08-21 NOTE — Discharge Instructions (Signed)
Take Afrin as prescribed.  Do not take medication for more than 3 days.

## 2014-08-21 NOTE — ED Notes (Signed)
Declined W/C at D/C and was escorted to lobby by RN. 

## 2014-08-21 NOTE — ED Notes (Signed)
Pt reports a 2 month long symptom of a runny nose worse at night. Nasal drainage is clear to white in color.Pt denies any fevers.

## 2014-09-13 ENCOUNTER — Telehealth: Payer: Self-pay | Admitting: Oncology

## 2014-09-13 ENCOUNTER — Ambulatory Visit (HOSPITAL_BASED_OUTPATIENT_CLINIC_OR_DEPARTMENT_OTHER): Payer: Medicaid Other | Admitting: Oncology

## 2014-09-13 ENCOUNTER — Ambulatory Visit: Payer: Medicaid Other

## 2014-09-13 ENCOUNTER — Other Ambulatory Visit (HOSPITAL_BASED_OUTPATIENT_CLINIC_OR_DEPARTMENT_OTHER): Payer: Medicaid Other

## 2014-09-13 VITALS — BP 134/75 | HR 65 | Temp 98.3°F | Resp 18 | Wt 154.0 lb

## 2014-09-13 DIAGNOSIS — C61 Malignant neoplasm of prostate: Secondary | ICD-10-CM

## 2014-09-13 DIAGNOSIS — C9000 Multiple myeloma not having achieved remission: Secondary | ICD-10-CM

## 2014-09-13 DIAGNOSIS — Z95828 Presence of other vascular implants and grafts: Secondary | ICD-10-CM

## 2014-09-13 DIAGNOSIS — M545 Low back pain: Secondary | ICD-10-CM

## 2014-09-13 LAB — CBC WITH DIFFERENTIAL/PLATELET
BASO%: 0.7 % (ref 0.0–2.0)
BASOS ABS: 0 10*3/uL (ref 0.0–0.1)
EOS%: 2.4 % (ref 0.0–7.0)
Eosinophils Absolute: 0.2 10*3/uL (ref 0.0–0.5)
HCT: 36.9 % — ABNORMAL LOW (ref 38.4–49.9)
HGB: 12.1 g/dL — ABNORMAL LOW (ref 13.0–17.1)
LYMPH#: 1.5 10*3/uL (ref 0.9–3.3)
LYMPH%: 22.4 % (ref 14.0–49.0)
MCH: 32.1 pg (ref 27.2–33.4)
MCHC: 32.8 g/dL (ref 32.0–36.0)
MCV: 97.9 fL (ref 79.3–98.0)
MONO#: 0.5 10*3/uL (ref 0.1–0.9)
MONO%: 8 % (ref 0.0–14.0)
NEUT#: 4.4 10*3/uL (ref 1.5–6.5)
NEUT%: 66.5 % (ref 39.0–75.0)
Platelets: 204 10*3/uL (ref 140–400)
RBC: 3.77 10*6/uL — AB (ref 4.20–5.82)
RDW: 13 % (ref 11.0–14.6)
WBC: 6.7 10*3/uL (ref 4.0–10.3)

## 2014-09-13 LAB — COMPREHENSIVE METABOLIC PANEL (CC13)
ALT: 10 U/L (ref 0–55)
AST: 17 U/L (ref 5–34)
Albumin: 3.9 g/dL (ref 3.5–5.0)
Alkaline Phosphatase: 80 U/L (ref 40–150)
Anion Gap: 12 mEq/L — ABNORMAL HIGH (ref 3–11)
BUN: 14.3 mg/dL (ref 7.0–26.0)
CALCIUM: 9.1 mg/dL (ref 8.4–10.4)
CO2: 23 meq/L (ref 22–29)
Chloride: 107 mEq/L (ref 98–109)
Creatinine: 1 mg/dL (ref 0.7–1.3)
Glucose: 109 mg/dl (ref 70–140)
Potassium: 4.4 mEq/L (ref 3.5–5.1)
SODIUM: 142 meq/L (ref 136–145)
TOTAL PROTEIN: 7.1 g/dL (ref 6.4–8.3)
Total Bilirubin: 0.33 mg/dL (ref 0.20–1.20)

## 2014-09-13 MED ORDER — SODIUM CHLORIDE 0.9 % IJ SOLN
10.0000 mL | INTRAMUSCULAR | Status: DC | PRN
  Administered 2014-09-13: 10 mL via INTRAVENOUS
  Filled 2014-09-13: qty 10

## 2014-09-13 MED ORDER — HEPARIN SOD (PORK) LOCK FLUSH 100 UNIT/ML IV SOLN
500.0000 [IU] | Freq: Once | INTRAVENOUS | Status: AC
  Administered 2014-09-13: 500 [IU] via INTRAVENOUS
  Filled 2014-09-13: qty 5

## 2014-09-13 NOTE — Telephone Encounter (Signed)
appts made and avs printed for pt  Travis Nguyen °

## 2014-09-13 NOTE — Patient Instructions (Signed)

## 2014-09-13 NOTE — Progress Notes (Signed)
Hematology and Oncology Follow Up Visit  Travis Nguyen [id number] 12/28/1950 64 y.o. 09/13/2014 2:34 PM [name],[name], MDSwayne, [name], MD   Principle Diagnosis: 64-year-old gentleman with the following diagnoses:  1. 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 Initially at smoldering subtype of 11% plasma cell in the bone marrow. He subsequently developed active multiple myeloma in February of 2014 with a 23% plasma cell infiltration. 2. Prostate cancer diagnosed in May of 2014. He presented with an elevated PSA of 13.2 stage TI C. clinical staging. His Gleason score was 3+4 equals 7.  Prior Therapy: He was treated initially with Cytoxan, Velcade and Decadron and subsequently his regimen changed to a Velcade, Revlimid with dexamethasone. He had an excellent response with that an M spike that is no longer detected an IgG level within normal range at April 2015. Of note, he did not have any poor prognostic features on his cytogenetics.   He is status post a robotic-assisted laparoscopic radical prostatectomy and bilateral lymph node dissection on 02/09/2014. The final pathology showed prostate adenocarcinoma Gleason score 4+3 equals 7 involving both lobes. Extraprostatic extension was present. The bladder neck margin was negative. Zero out of two lymph nodes were involved.  Current therapy:  He is on observation and surveillance.  Interim History:  Mr. Travis Nguyen presents today for a followup visit. Since the last visit, he continues to do very well. He declined of autologous stem cell transplant or maintenance therapy for his multiple myeloma. He continues to be very functional and performs activities of daily living. He still has some incontinence issue that has improved. Otherwise he is completely asymptomatic at this point. He is eating well and have excellent quality of life. He did not report any fevers or chills or sweats. Does not report any hematuria or dysuria. Does not  report any headaches or blurry vision or double vision. Does not report any syncope or seizures. He does not report any skeletal complaints of arthralgias or myalgias. He does not report any chest pain, shortness of breath, palpitation orthopnea or PND. He does not report any shortness of breath or wheezing. His performance status and activity level remains excellent. Rest of his review of systems unremarkable.  Medications: I have reviewed the patient's current medications.  Current Outpatient Prescriptions  Medication Sig Dispense Refill  . acetaminophen (TYLENOL) 500 MG tablet Take 500-1,000 mg by mouth every 6 (six) hours as needed (For pain.).    . lidocaine-prilocaine (EMLA) cream Apply 1 application topically daily as needed (Applies to port-a-cath.).    . losartan (COZAAR) 100 MG tablet Take 100 mg by mouth every morning.     . oxycodone (ROXICODONE) 30 MG immediate release tablet Take 30 mg by mouth every 6 (six) hours as needed for pain (For back pain.).     . [DISCONTINUED] pantoprazole (PROTONIX) 40 MG tablet Take 1 tablet (40 mg total) by mouth daily. (Patient not taking: Reported on 07/24/2014) 30 tablet 0   No current facility-administered medications for this visit.     Allergies: No Known Allergies  Past Medical History, Surgical history, Social history, and Family History were reviewed and updated.   Physical Exam: Blood pressure 134/75, pulse 65, temperature 98.3 F (36.8 C), temperature source Oral, resp. rate 18, weight 154 lb (69.854 kg), SpO2 100 %. ECOG: 1 General appearance: alert and cooperative Head: Normocephalic, without obvious abnormality Neck: no adenopathy Lymph nodes: Cervical, supraclavicular, and axillary nodes normal. Heart:regular rate and rhythm, S1, S2 normal, no   murmur, click, rub or gallop Lung:chest clear, no wheezing, rales, normal symmetric air entry Abdomin: soft, non-tender, without masses or organomegaly EXT:no erythema, induration, or  nodules   Lab Results: Lab Results  Component Value Date   WBC 6.7 09/13/2014   HGB 12.1* 09/13/2014   HCT 36.9* 09/13/2014   MCV 97.9 09/13/2014   PLT 204 09/13/2014     Chemistry      Component Value Date/Time   NA 142 09/13/2014 1345   NA 138 08/04/2014 1357   K 4.4 09/13/2014 1345   K 4.0 08/04/2014 1357   CL 106 08/04/2014 1357   CL 105 01/01/2013 0835   CO2 23 09/13/2014 1345   CO2 26 08/04/2014 1357   BUN 14.3 09/13/2014 1345   BUN 15 08/04/2014 1357   CREATININE 1.0 09/13/2014 1345   CREATININE 0.85 08/04/2014 1357   CREATININE (Revised) 0.92 07/24/2009 1449      Component Value Date/Time   CALCIUM 9.1 09/13/2014 1345   CALCIUM 9.0 08/04/2014 1357   ALKPHOS 80 09/13/2014 1345   ALKPHOS 107 05/30/2014 0614   AST 17 09/13/2014 1345   AST 197* 05/30/2014 0614   ALT 10 09/13/2014 1345   ALT 186* 05/30/2014 0614   BILITOT 0.33 09/13/2014 1345   BILITOT 0.4 05/30/2014 0614      Results for Travis Nguyen, Travis Nguyen (MRN [id number]) as of 09/13/2014 14:39  Ref. Range 06/14/2014 15:09  M-SPIKE, % Latest Units: Nguyen/dL NOT DET  SPE Interp. No range found *  IgG (Immunoglobin Nguyen), Serum Latest Range: 650-1600 mg/dL 1020  IgA Latest Range: 68-379 mg/dL 141  IgM, Serum Latest Range: 41-251 mg/dL 83  Total Protein, Serum Electrophoresis Latest Range: 6.0-8.3 Nguyen/dL 6.9  Kappa free light chain Latest Range: 0.33-1.94 mg/dL 1.34  Lambda Free Lght Chn Latest Range: 0.57-2.63 mg/dL 0.97  Kappa:Lambda Ratio Latest Range: 0.26-1.65  1.38    Impression and Plan:  64-year-old gentleman with the following issues  1.IgG Kappa Multiple Myeloma: His last protein studies on 03/09/2014 showed complete response without any evidence of an M spike. His quantitative immunoglobulins are all within normal range. He has been off Revlimid since his episode of small bowel obstruction in October 2015. He refused autologous stem cell transplant because of toxicities and complication. He also have  refuse maintenance therapy for the time being and elected with observation and surveillance. The plan is to continue routine follow-up and check his protein studies every 3-6 months.    2. Prostate cancer: He is status post prostatectomy on 02/09/2014. His pathology showed a Gleason score 4+3 equals 7 with disease involving both lobes. He has extraprostatic extension but no lymph node involvement. His last PSA is undetectable without any evidence of disease.  3. Chronic back pain: He is on oxycodone every 12 hours seems to be manageable.  3. Port-A-Cath flush: This will be arranged every 2 months.  4. Followup: Will be in 6 months.    [name],FIRAS, MD 3/1/20162:34 PM 

## 2014-09-16 ENCOUNTER — Emergency Department (HOSPITAL_COMMUNITY)
Admission: EM | Admit: 2014-09-16 | Discharge: 2014-09-16 | Disposition: A | Discharge status: Home / Self Care | Payer: Medicaid Other | Attending: Emergency Medicine | Admitting: Emergency Medicine

## 2014-09-16 ENCOUNTER — Encounter (HOSPITAL_COMMUNITY): Payer: Self-pay | Admitting: Emergency Medicine

## 2014-09-16 DIAGNOSIS — M1990 Unspecified osteoarthritis, unspecified site: Secondary | ICD-10-CM | POA: Diagnosis not present

## 2014-09-16 DIAGNOSIS — Z8701 Personal history of pneumonia (recurrent): Secondary | ICD-10-CM | POA: Diagnosis not present

## 2014-09-16 DIAGNOSIS — Z79899 Other long term (current) drug therapy: Secondary | ICD-10-CM | POA: Insufficient documentation

## 2014-09-16 DIAGNOSIS — Z8579 Personal history of other malignant neoplasms of lymphoid, hematopoietic and related tissues: Secondary | ICD-10-CM | POA: Diagnosis not present

## 2014-09-16 DIAGNOSIS — R011 Cardiac murmur, unspecified: Secondary | ICD-10-CM | POA: Insufficient documentation

## 2014-09-16 DIAGNOSIS — Z87891 Personal history of nicotine dependence: Secondary | ICD-10-CM | POA: Insufficient documentation

## 2014-09-16 DIAGNOSIS — M79602 Pain in left arm: Secondary | ICD-10-CM

## 2014-09-16 DIAGNOSIS — I10 Essential (primary) hypertension: Secondary | ICD-10-CM | POA: Diagnosis not present

## 2014-09-16 DIAGNOSIS — Z862 Personal history of diseases of the blood and blood-forming organs and certain disorders involving the immune mechanism: Secondary | ICD-10-CM | POA: Diagnosis not present

## 2014-09-16 DIAGNOSIS — M79622 Pain in left upper arm: Secondary | ICD-10-CM | POA: Insufficient documentation

## 2014-09-16 DIAGNOSIS — Z8546 Personal history of malignant neoplasm of prostate: Secondary | ICD-10-CM | POA: Insufficient documentation

## 2014-09-16 DIAGNOSIS — Z8719 Personal history of other diseases of the digestive system: Secondary | ICD-10-CM | POA: Insufficient documentation

## 2014-09-16 DIAGNOSIS — Z85820 Personal history of malignant melanoma of skin: Secondary | ICD-10-CM | POA: Diagnosis not present

## 2014-09-16 MED ORDER — ACETAMINOPHEN 325 MG PO TABS
650.0000 mg | ORAL_TABLET | Freq: Once | ORAL | Status: AC
  Administered 2014-09-16: 650 mg via ORAL
  Filled 2014-09-16: qty 2

## 2014-09-16 NOTE — Discharge Instructions (Signed)
Arthritis, Nonspecific °Arthritis is pain, redness, warmth, or puffiness (inflammation) of a joint. The joint may be stiff or hurt when you move it. One or more joints may be affected. There are many types of arthritis. Your doctor may not know what type you have right away. The most common cause of arthritis is wear and tear on the joint (osteoarthritis). °HOME CARE  °· Only take medicine as told by your doctor. °· Rest the joint as much as possible. °· Raise (elevate) your joint if it is puffy. °· Use crutches if the painful joint is in your leg. °· Drink enough fluids to keep your pee (urine) clear or pale yellow. °· Follow your doctor's diet instructions. °· Use cold packs for very bad joint pain for 10 to 15 minutes every hour. Ask your doctor if it is okay for you to use hot packs. °· Exercise as told by your doctor. °· Take a warm shower if you have stiffness in the morning. °· Move your sore joints throughout the day. °GET HELP RIGHT AWAY IF:  °· You have a fever. °· You have very bad joint pain, puffiness, or redness. °· You have many joints that are painful and puffy. °· You are not getting better with treatment. °· You have very bad back pain or leg weakness. °· You cannot control when you poop (bowel movement) or pee (urinate). °· You do not feel better in 24 hours or are getting worse. °· You are having side effects from your medicine. °MAKE SURE YOU:  °· Understand these instructions. °· Will watch your condition. °· Will get help right away if you are not doing well or get worse. °Document Released: 09/25/2009 Document Revised: 12/31/2011 Document Reviewed: 09/25/2009 °[employer]® Patient Information ©2015 [employer]. This information is not intended to replace advice given to you by your health care provider. Make sure you discuss any questions you have with your health care provider. ° °
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
## 2014-09-16 NOTE — ED Notes (Signed)
Pt reports intermittent sharp pain to L upper arm x 3 wks, denies known injury, denies any known precipitating factors. Pt also c/o L thumb "popping out of joint." Pt reports he believes it is r/t arthritis.

## 2014-11-15 ENCOUNTER — Ambulatory Visit (HOSPITAL_BASED_OUTPATIENT_CLINIC_OR_DEPARTMENT_OTHER): Payer: Medicaid Other

## 2014-11-15 VITALS — BP 144/78 | HR 60 | Temp 98.2°F | Resp 18

## 2014-11-15 DIAGNOSIS — Z95828 Presence of other vascular implants and grafts: Secondary | ICD-10-CM

## 2014-11-15 DIAGNOSIS — Z452 Encounter for adjustment and management of vascular access device: Secondary | ICD-10-CM

## 2014-11-15 DIAGNOSIS — C9000 Multiple myeloma not having achieved remission: Secondary | ICD-10-CM | POA: Diagnosis not present

## 2014-11-15 MED ORDER — HEPARIN SOD (PORK) LOCK FLUSH 100 UNIT/ML IV SOLN
500.0000 [IU] | Freq: Once | INTRAVENOUS | Status: AC
  Administered 2014-11-15: 500 [IU] via INTRAVENOUS
  Filled 2014-11-15: qty 5

## 2014-11-15 MED ORDER — SODIUM CHLORIDE 0.9 % IJ SOLN
10.0000 mL | INTRAMUSCULAR | Status: DC | PRN
  Administered 2014-11-15: 10 mL via INTRAVENOUS
  Filled 2014-11-15: qty 10

## 2014-11-15 NOTE — Patient Instructions (Signed)

## 2015-01-15 ENCOUNTER — Encounter (HOSPITAL_COMMUNITY): Payer: Self-pay | Admitting: *Deleted

## 2015-01-15 ENCOUNTER — Emergency Department (HOSPITAL_COMMUNITY)
Admission: EM | Admit: 2015-01-15 | Discharge: 2015-01-15 | Discharge status: Against Medical Advice | Payer: Medicaid Other | Attending: Emergency Medicine | Admitting: Emergency Medicine

## 2015-01-15 DIAGNOSIS — Z8579 Personal history of other malignant neoplasms of lymphoid, hematopoietic and related tissues: Secondary | ICD-10-CM | POA: Diagnosis not present

## 2015-01-15 DIAGNOSIS — Z862 Personal history of diseases of the blood and blood-forming organs and certain disorders involving the immune mechanism: Secondary | ICD-10-CM | POA: Insufficient documentation

## 2015-01-15 DIAGNOSIS — Z8701 Personal history of pneumonia (recurrent): Secondary | ICD-10-CM | POA: Insufficient documentation

## 2015-01-15 DIAGNOSIS — Z8546 Personal history of malignant neoplasm of prostate: Secondary | ICD-10-CM | POA: Insufficient documentation

## 2015-01-15 DIAGNOSIS — Z8719 Personal history of other diseases of the digestive system: Secondary | ICD-10-CM | POA: Diagnosis not present

## 2015-01-15 DIAGNOSIS — Z8709 Personal history of other diseases of the respiratory system: Secondary | ICD-10-CM | POA: Insufficient documentation

## 2015-01-15 DIAGNOSIS — M62838 Other muscle spasm: Secondary | ICD-10-CM

## 2015-01-15 DIAGNOSIS — M1990 Unspecified osteoarthritis, unspecified site: Secondary | ICD-10-CM | POA: Diagnosis not present

## 2015-01-15 DIAGNOSIS — Z85820 Personal history of malignant melanoma of skin: Secondary | ICD-10-CM | POA: Diagnosis not present

## 2015-01-15 DIAGNOSIS — R011 Cardiac murmur, unspecified: Secondary | ICD-10-CM | POA: Insufficient documentation

## 2015-01-15 DIAGNOSIS — M542 Cervicalgia: Secondary | ICD-10-CM | POA: Diagnosis present

## 2015-01-15 DIAGNOSIS — I10 Essential (primary) hypertension: Secondary | ICD-10-CM | POA: Insufficient documentation

## 2015-01-15 DIAGNOSIS — Z87891 Personal history of nicotine dependence: Secondary | ICD-10-CM | POA: Insufficient documentation

## 2015-01-15 MED ORDER — CYCLOBENZAPRINE HCL 10 MG PO TABS: 10.0000 mg | ORAL_TABLET | Freq: Two times a day (BID) | ORAL | Status: DC | PRN

## 2015-01-15 MED ORDER — CYCLOBENZAPRINE HCL 10 MG PO TABS: 10.0000 mg | ORAL_TABLET | Freq: Once | ORAL | Status: DC

## 2015-01-15 MED ORDER — IBUPROFEN 400 MG PO TABS: 800.0000 mg | ORAL_TABLET | Freq: Once | ORAL | Status: DC

## 2015-01-15 MED ORDER — IBUPROFEN 800 MG PO TABS: 800.0000 mg | ORAL_TABLET | Freq: Three times a day (TID) | ORAL | Status: DC | PRN

## 2015-01-15 NOTE — ED Notes (Signed)
C/o neck apin onset 8-9 months ago no history of injury
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
## 2015-01-15 NOTE — Discharge Instructions (Signed)

## 2015-01-20 ENCOUNTER — Other Ambulatory Visit: Payer: Self-pay | Admitting: *Deleted

## 2015-01-20 MED ORDER — LIDOCAINE-PRILOCAINE 2.5-2.5 % EX CREA
1.0000 | TOPICAL_CREAM | Freq: Every day | CUTANEOUS | Status: DC | PRN
Start: 2015-01-20 — End: 2016-01-08

## 2015-01-23 ENCOUNTER — Ambulatory Visit (HOSPITAL_BASED_OUTPATIENT_CLINIC_OR_DEPARTMENT_OTHER): Payer: Medicaid Other

## 2015-01-23 VITALS — BP 139/84 | HR 60 | Temp 98.4°F | Resp 18

## 2015-01-23 DIAGNOSIS — Z95828 Presence of other vascular implants and grafts: Secondary | ICD-10-CM

## 2015-01-23 DIAGNOSIS — C9000 Multiple myeloma not having achieved remission: Secondary | ICD-10-CM | POA: Diagnosis not present

## 2015-01-23 DIAGNOSIS — Z452 Encounter for adjustment and management of vascular access device: Secondary | ICD-10-CM | POA: Diagnosis present

## 2015-01-23 MED ORDER — HEPARIN SOD (PORK) LOCK FLUSH 100 UNIT/ML IV SOLN
500.0000 [IU] | Freq: Once | INTRAVENOUS | Status: AC
  Administered 2015-01-23: 500 [IU] via INTRAVENOUS
  Filled 2015-01-23: qty 5

## 2015-01-23 MED ORDER — SODIUM CHLORIDE 0.9 % IJ SOLN
10.0000 mL | INTRAMUSCULAR | Status: DC | PRN
  Administered 2015-01-23: 10 mL via INTRAVENOUS
  Filled 2015-01-23: qty 10

## 2015-01-23 NOTE — Patient Instructions (Signed)

## 2015-02-17 DIAGNOSIS — J309 Allergic rhinitis, unspecified: Secondary | ICD-10-CM | POA: Diagnosis not present

## 2015-02-17 DIAGNOSIS — M5416 Radiculopathy, lumbar region: Secondary | ICD-10-CM | POA: Diagnosis not present

## 2015-02-17 DIAGNOSIS — Z8719 Personal history of other diseases of the digestive system: Secondary | ICD-10-CM | POA: Diagnosis not present

## 2015-02-17 DIAGNOSIS — C9000 Multiple myeloma not having achieved remission: Secondary | ICD-10-CM | POA: Diagnosis not present

## 2015-02-17 DIAGNOSIS — I10 Essential (primary) hypertension: Secondary | ICD-10-CM | POA: Diagnosis not present

## 2015-02-17 DIAGNOSIS — D649 Anemia, unspecified: Secondary | ICD-10-CM | POA: Diagnosis not present

## 2015-02-17 DIAGNOSIS — C61 Malignant neoplasm of prostate: Secondary | ICD-10-CM | POA: Diagnosis not present

## 2015-02-22 ENCOUNTER — Encounter: Payer: Self-pay | Admitting: Oncology

## 2015-02-22 NOTE — Progress Notes (Signed)
Patient has medicare now

## 2015-03-03 DIAGNOSIS — C61 Malignant neoplasm of prostate: Secondary | ICD-10-CM | POA: Diagnosis not present

## 2015-03-06 DIAGNOSIS — M5416 Radiculopathy, lumbar region: Secondary | ICD-10-CM | POA: Diagnosis not present

## 2015-03-06 DIAGNOSIS — M549 Dorsalgia, unspecified: Secondary | ICD-10-CM | POA: Diagnosis not present

## 2015-03-10 DIAGNOSIS — N3281 Overactive bladder: Secondary | ICD-10-CM | POA: Diagnosis not present

## 2015-03-10 DIAGNOSIS — Z8546 Personal history of malignant neoplasm of prostate: Secondary | ICD-10-CM | POA: Diagnosis not present

## 2015-03-16 DIAGNOSIS — M5416 Radiculopathy, lumbar region: Secondary | ICD-10-CM | POA: Diagnosis not present

## 2015-03-17 DIAGNOSIS — M5416 Radiculopathy, lumbar region: Secondary | ICD-10-CM | POA: Diagnosis not present

## 2015-03-17 DIAGNOSIS — M545 Low back pain: Secondary | ICD-10-CM | POA: Diagnosis not present

## 2015-03-17 DIAGNOSIS — M5126 Other intervertebral disc displacement, lumbar region: Secondary | ICD-10-CM | POA: Diagnosis not present

## 2015-03-17 DIAGNOSIS — M5136 Other intervertebral disc degeneration, lumbar region: Secondary | ICD-10-CM | POA: Diagnosis not present

## 2015-03-21 ENCOUNTER — Ambulatory Visit (HOSPITAL_BASED_OUTPATIENT_CLINIC_OR_DEPARTMENT_OTHER): Payer: Medicare Other

## 2015-03-21 ENCOUNTER — Ambulatory Visit (HOSPITAL_BASED_OUTPATIENT_CLINIC_OR_DEPARTMENT_OTHER): Payer: Medicare Other | Admitting: Oncology

## 2015-03-21 ENCOUNTER — Telehealth: Payer: Self-pay | Admitting: Oncology

## 2015-03-21 ENCOUNTER — Other Ambulatory Visit (HOSPITAL_BASED_OUTPATIENT_CLINIC_OR_DEPARTMENT_OTHER): Payer: Medicare Other

## 2015-03-21 VITALS — BP 123/75 | HR 71 | Temp 99.0°F | Resp 20 | Ht 73.5 in | Wt 156.7 lb

## 2015-03-21 DIAGNOSIS — C9000 Multiple myeloma not having achieved remission: Secondary | ICD-10-CM | POA: Diagnosis not present

## 2015-03-21 DIAGNOSIS — C61 Malignant neoplasm of prostate: Secondary | ICD-10-CM | POA: Diagnosis not present

## 2015-03-21 DIAGNOSIS — Z95828 Presence of other vascular implants and grafts: Secondary | ICD-10-CM

## 2015-03-21 LAB — CBC WITH DIFFERENTIAL/PLATELET
BASO%: 0.2 % (ref 0.0–2.0)
Basophils Absolute: 0 10*3/uL (ref 0.0–0.1)
EOS%: 0.5 % (ref 0.0–7.0)
Eosinophils Absolute: 0 10*3/uL (ref 0.0–0.5)
HCT: 38.3 % — ABNORMAL LOW (ref 38.4–49.9)
HGB: 13.2 g/dL (ref 13.0–17.1)
LYMPH%: 31 % (ref 14.0–49.0)
MCH: 32.4 pg (ref 27.2–33.4)
MCHC: 34.5 g/dL (ref 32.0–36.0)
MCV: 94.1 fL (ref 79.3–98.0)
MONO#: 0.2 10*3/uL (ref 0.1–0.9)
MONO%: 5.5 % (ref 0.0–14.0)
NEUT#: 2.5 10*3/uL (ref 1.5–6.5)
NEUT%: 62.8 % (ref 39.0–75.0)
Platelets: 152 10*3/uL (ref 140–400)
RBC: 4.07 10*6/uL — AB (ref 4.20–5.82)
RDW: 12.6 % (ref 11.0–14.6)
WBC: 4 10*3/uL (ref 4.0–10.3)
lymph#: 1.3 10*3/uL (ref 0.9–3.3)

## 2015-03-21 LAB — COMPREHENSIVE METABOLIC PANEL (CC13)
ALT: 19 U/L (ref 0–55)
AST: 23 U/L (ref 5–34)
Albumin: 4.1 g/dL (ref 3.5–5.0)
Alkaline Phosphatase: 78 U/L (ref 40–150)
Anion Gap: 6 mEq/L (ref 3–11)
BUN: 11.5 mg/dL (ref 7.0–26.0)
CHLORIDE: 106 meq/L (ref 98–109)
CO2: 25 mEq/L (ref 22–29)
CREATININE: 0.9 mg/dL (ref 0.7–1.3)
Calcium: 9.1 mg/dL (ref 8.4–10.4)
EGFR: >90 mL/min/{1.73_m2} (ref >90)
Glucose: 101 mg/dl (ref 70–140)
Potassium: 5 mEq/L (ref 3.5–5.1)
Sodium: 137 mEq/L (ref 136–145)
Total Bilirubin: 0.46 mg/dL (ref 0.20–1.20)
Total Protein: 7.2 g/dL (ref 6.4–8.3)

## 2015-03-21 MED ORDER — HEPARIN SOD (PORK) LOCK FLUSH 100 UNIT/ML IV SOLN
500.0000 [IU] | Freq: Once | INTRAVENOUS | Status: AC
  Administered 2015-03-21: 500 [IU] via INTRAVENOUS
  Filled 2015-03-21: qty 5

## 2015-03-21 MED ORDER — SODIUM CHLORIDE 0.9 % IJ SOLN
10.0000 mL | INTRAMUSCULAR | Status: DC | PRN
  Administered 2015-03-21: 10 mL via INTRAVENOUS
  Filled 2015-03-21: qty 10

## 2015-03-21 NOTE — Progress Notes (Signed)
Hematology and Oncology Follow Up Visit  Travis Nguyen [id number] 1951/06/18 64 y.o. 03/21/2015 10:01 AM Travis Nguyen, Travis Brow, MD   Principle Diagnosis: 64 year old gentleman with the following diagnoses:  1. 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 Initially at smoldering subtype of 11% plasma cell in the bone marrow. He subsequently developed active multiple myeloma in February of 2014 with a 23% plasma cell infiltration. 2. Prostate cancer diagnosed in May of 2014. He presented with an elevated PSA of 13.2 stage TI C. clinical staging. His Gleason score was 3+4 equals 7.  Prior Therapy: He was treated initially with Cytoxan, Velcade and Decadron and subsequently his regimen changed to a Velcade, Revlimid with dexamethasone. He had an excellent response with that an M spike that is no longer detected an IgG level within normal range at April 2015. Of note, he did not have any poor prognostic features on his cytogenetics.   He is status post a robotic-assisted laparoscopic radical prostatectomy and bilateral lymph node dissection on 02/09/2014. The final pathology showed prostate adenocarcinoma Gleason score 4+3 equals 7 involving both lobes. Extraprostatic extension was present. The bladder neck margin was negative. Zero out of two lymph nodes were involved.  Current therapy:  He is on observation and surveillance.  Interim History:  Travis Nguyen presents today for a followup visit. Since the last visit, he reports no increase in his lower back pain. He is back pain is chronic in nature and have been diagnosed with osteoarthritis and spinal stenosis. He is potentially under consideration for surgery. He continues to be very functional and performs activities of daily living. He does not report any neurological deficits. Does not report any peripheral neuropathy or weakness. He still has some incontinence issue that has improved related to his prostate surgery. He is  eating well and have excellent quality of life.   He did not report any fevers or chills or sweats. Does not report any hematuria or dysuria. Does not report any headaches or blurry vision or double vision. Does not report any syncope or seizures. He does not report any skeletal complaints of arthralgias or myalgias. He does not report any chest pain, shortness of breath, palpitation orthopnea or PND. He does not report any shortness of breath or wheezing. His performance status and activity level remains excellent. Rest of his review of systems unremarkable.  Medications: I have reviewed the patient's current medications.  Current Outpatient Prescriptions  Medication Sig Dispense Refill  . acetaminophen (TYLENOL) 500 MG tablet Take 500-1,000 mg by mouth every 6 (six) hours as needed (For pain.).    [name] cyclobenzaprine (FLEXERIL) 10 MG tablet Take 1 tablet (10 mg total) by mouth 2 (two) times daily as needed for muscle spasms. 20 tablet 0  . ibuprofen (ADVIL,MOTRIN) 800 MG tablet Take 1 tablet (800 mg total) by mouth every 8 (eight) hours as needed. 21 tablet 0  . lidocaine-prilocaine (EMLA) cream Apply 1 application topically daily as needed (Applies to port-a-cath.). 30 g 1  . losartan (COZAAR) 100 MG tablet Take 100 mg by mouth every morning.     [name] oxycodone (ROXICODONE) 30 MG immediate release tablet Take 30 mg by mouth every 6 (six) hours as needed for pain (For back pain.).     . [DISCONTINUED] pantoprazole (PROTONIX) 40 MG tablet Take 1 tablet (40 mg total) by mouth daily. (Patient not taking: Reported on 07/24/2014) 30 tablet 0   No current facility-administered medications for this visit.  Allergies: No Known Allergies  Past Medical History, Surgical history, Social history, and Family History were reviewed and updated.   Physical Exam: Blood pressure 123/75, pulse 71, temperature 99 F (37.2 C), temperature source Oral, resp. rate 20, height 6' 1.5" (1.867 m), weight 156 lb 11.2  oz (71.079 kg), SpO2 100 %. ECOG: 1 General appearance: alert and cooperative not in any distress. Head: Normocephalic, without obvious abnormality Neck: no adenopathy Lymph nodes: Cervical, supraclavicular, and axillary nodes normal. Heart:regular rate and rhythm, S1, S2 normal, no murmur, click, rub or gallop Lung:chest clear, no wheezing, rales, normal symmetric air entry Abdomin: soft, non-tender, without masses or organomegaly EXT:no erythema, induration, or nodules Neurological exam showed no deficits or lesions.  Lab Results: Lab Results  Component Value Date   WBC 4.0 03/21/2015   HGB 13.2 03/21/2015   HCT 38.3* 03/21/2015   MCV 94.1 03/21/2015   PLT 152 03/21/2015     Chemistry      Component Value Date/Time   NA 142 09/13/2014 1345   NA 138 08/04/2014 1357   K 4.4 09/13/2014 1345   K 4.0 08/04/2014 1357   CL 106 08/04/2014 1357   CL 105 01/01/2013 0835   CO2 23 09/13/2014 1345   CO2 26 08/04/2014 1357   BUN 14.3 09/13/2014 1345   BUN 15 08/04/2014 1357   CREATININE 1.0 09/13/2014 1345   CREATININE 0.85 08/04/2014 1357   CREATININE (Revised) 0.92 07/24/2009 1449      Component Value Date/Time   CALCIUM 9.1 09/13/2014 1345   CALCIUM 9.0 08/04/2014 1357   ALKPHOS 80 09/13/2014 1345   ALKPHOS 107 05/30/2014 0614   AST 17 09/13/2014 1345   AST 197* 05/30/2014 0614   ALT 10 09/13/2014 1345   ALT 186* 05/30/2014 0614   BILITOT 0.33 09/13/2014 1345   BILITOT 0.4 05/30/2014 0614     Results for Travis, Nguyen (MRN [id number]) as of 03/21/2015 10:04  Ref. Range 03/09/2014 08:19 06/14/2014 15:09  IgG (Immunoglobin G), Serum Latest Ref Range: [phone] mg/dL 1030 1020  IgA Latest Ref Range: 68-379 mg/dL 144 141  IgM, Serum Latest Ref Range: 41-251 mg/dL 85 83  Total Protein, Serum Electrophoresis Latest Ref Range: 6.0-8.3 g/dL  6.9  Kappa free light chain Latest Ref Range: 0.33-1.94 mg/dL 1.27 1.34  Lambda Free Lght Chn Latest Ref Range: 0.57-2.63 mg/dL 1.10  0.97  Kappa:Lambda Ratio Latest Ref Range: 0.26-1.65  1.15 1.38   Impression and Plan:  64 year old gentleman with the following issues  1.IgG Kappa Multiple Myeloma: His last protein studies on December 2015 showed complete response without any evidence of an M spike. His quantitative immunoglobulins are all within normal range. He has been off Revlimid since his episode of small bowel obstruction in October 2015. He refused autologous stem cell transplant because of toxicities and complication.   He also have refuse maintenance therapy for the time being and elected with observation and surveillance. The plan is to continue routine follow-up and check his protein studies every 6 months.  If he develops any relapse we will use salvage therapy at that time.   2. Prostate cancer: He is status post prostatectomy on 02/09/2014. His pathology showed a Gleason score 4+3 equals 7 with disease involving both lobes. He has extraprostatic extension but no lymph node involvement. His last PSA is undetectable without any evidence of disease.  3. Chronic back pain: He is on oxycodone every 12 hours seems to be manageable. He is under consideration for possible surgery  the near future.  3. Port-A-Cath flush: This will be arranged every 2 months.  4. Followup: Will be in 6 months.    [facility], MD 9/6/201610:01 AM

## 2015-03-21 NOTE — Patient Instructions (Signed)

## 2015-03-21 NOTE — Telephone Encounter (Signed)
per pof to sch pt appt-gave pt copy of avs °

## 2015-03-23 LAB — SPEP & IFE WITH QIG
ALPHA-2-GLOBULIN: 0.6 g/dL (ref 0.5–0.9)
Albumin ELP: 4.1 g/dL (ref 3.8–4.8)
Alpha-1-Globulin: 0.2 g/dL (ref 0.2–0.3)
Beta 2: 0.4 g/dL (ref 0.2–0.5)
Beta Globulin: 0.4 g/dL (ref 0.4–0.6)
GAMMA GLOBULIN: 1.1 g/dL (ref 0.8–1.7)
IGG (IMMUNOGLOBIN G), SERUM: 1030 mg/dL (ref 650–1600)
IGM, SERUM: 109 mg/dL (ref 41–251)
IgA: 175 mg/dL (ref 68–379)
TOTAL PROTEIN, SERUM ELECTROPHOR: 6.8 g/dL (ref 6.1–8.1)

## 2015-03-23 LAB — KAPPA/LAMBDA LIGHT CHAINS
Kappa free light chain: 2.05 mg/dL — ABNORMAL HIGH (ref 0.33–1.94)
Kappa:Lambda Ratio: 2.11 — ABNORMAL HIGH (ref 0.26–1.65)
Lambda Free Lght Chn: 0.97 mg/dL (ref 0.57–2.63)

## 2015-04-12 DIAGNOSIS — Z23 Encounter for immunization: Secondary | ICD-10-CM | POA: Diagnosis not present

## 2015-04-13 DIAGNOSIS — M5416 Radiculopathy, lumbar region: Secondary | ICD-10-CM | POA: Diagnosis not present

## 2015-04-13 DIAGNOSIS — Z6821 Body mass index (BMI) 21.0-21.9, adult: Secondary | ICD-10-CM | POA: Diagnosis not present

## 2015-05-23 ENCOUNTER — Ambulatory Visit (HOSPITAL_BASED_OUTPATIENT_CLINIC_OR_DEPARTMENT_OTHER): Payer: Medicare Other

## 2015-05-23 VITALS — BP 136/77 | HR 64 | Temp 98.5°F | Resp 20

## 2015-05-23 DIAGNOSIS — C9000 Multiple myeloma not having achieved remission: Secondary | ICD-10-CM | POA: Diagnosis not present

## 2015-05-23 DIAGNOSIS — Z452 Encounter for adjustment and management of vascular access device: Secondary | ICD-10-CM

## 2015-05-23 DIAGNOSIS — Z95828 Presence of other vascular implants and grafts: Secondary | ICD-10-CM

## 2015-05-23 MED ORDER — HEPARIN SOD (PORK) LOCK FLUSH 100 UNIT/ML IV SOLN
500.0000 [IU] | Freq: Once | INTRAVENOUS | Status: AC
  Administered 2015-05-23: 500 [IU] via INTRAVENOUS
  Filled 2015-05-23: qty 5

## 2015-05-23 MED ORDER — SODIUM CHLORIDE 0.9 % IJ SOLN
10.0000 mL | INTRAMUSCULAR | Status: DC | PRN
  Administered 2015-05-23: 10 mL via INTRAVENOUS
  Filled 2015-05-23: qty 10

## 2015-06-12 DIAGNOSIS — M5416 Radiculopathy, lumbar region: Secondary | ICD-10-CM | POA: Diagnosis not present

## 2015-06-16 ENCOUNTER — Other Ambulatory Visit: Payer: Self-pay | Admitting: Neurosurgery

## 2015-06-16 DIAGNOSIS — M5416 Radiculopathy, lumbar region: Secondary | ICD-10-CM

## 2015-06-30 ENCOUNTER — Ambulatory Visit
Admission: RE | Admit: 2015-06-30 | Discharge: 2015-06-30 | Disposition: A | Discharge status: Home / Self Care | Payer: Medicare Other | Source: Ambulatory Visit | Attending: Neurosurgery | Admitting: Neurosurgery

## 2015-06-30 DIAGNOSIS — M5126 Other intervertebral disc displacement, lumbar region: Secondary | ICD-10-CM | POA: Diagnosis not present

## 2015-06-30 DIAGNOSIS — M5416 Radiculopathy, lumbar region: Secondary | ICD-10-CM

## 2015-06-30 MED ORDER — DIAZEPAM 5 MG PO TABS
10.0000 mg | ORAL_TABLET | Freq: Once | ORAL | Status: AC
  Administered 2015-06-30: 10 mg via ORAL

## 2015-06-30 MED ORDER — ONDANSETRON HCL 4 MG/2ML IJ SOLN: 4.0000 mg | Freq: Four times a day (QID) | INTRAMUSCULAR | Status: DC | PRN

## 2015-06-30 MED ORDER — IOHEXOL 180 MG/ML  SOLN
15.0000 mL | Freq: Once | INTRAMUSCULAR | Status: AC | PRN
  Administered 2015-06-30: 15 mL via INTRATHECAL

## 2015-06-30 NOTE — Discharge Instructions (Signed)

## 2015-07-04 DIAGNOSIS — M5416 Radiculopathy, lumbar region: Secondary | ICD-10-CM | POA: Diagnosis not present

## 2015-07-06 ENCOUNTER — Emergency Department (HOSPITAL_COMMUNITY)
Admission: EM | Admit: 2015-07-06 | Discharge: 2015-07-06 | Disposition: A | Discharge status: Home / Self Care | Payer: Medicare Other | Attending: Emergency Medicine | Admitting: Emergency Medicine

## 2015-07-06 ENCOUNTER — Encounter (HOSPITAL_COMMUNITY): Payer: Self-pay

## 2015-07-06 DIAGNOSIS — Z8739 Personal history of other diseases of the musculoskeletal system and connective tissue: Secondary | ICD-10-CM | POA: Insufficient documentation

## 2015-07-06 DIAGNOSIS — Z79899 Other long term (current) drug therapy: Secondary | ICD-10-CM | POA: Diagnosis not present

## 2015-07-06 DIAGNOSIS — I10 Essential (primary) hypertension: Secondary | ICD-10-CM | POA: Insufficient documentation

## 2015-07-06 DIAGNOSIS — Z8719 Personal history of other diseases of the digestive system: Secondary | ICD-10-CM | POA: Insufficient documentation

## 2015-07-06 DIAGNOSIS — Z87891 Personal history of nicotine dependence: Secondary | ICD-10-CM | POA: Insufficient documentation

## 2015-07-06 DIAGNOSIS — M549 Dorsalgia, unspecified: Secondary | ICD-10-CM | POA: Diagnosis not present

## 2015-07-06 DIAGNOSIS — Z862 Personal history of diseases of the blood and blood-forming organs and certain disorders involving the immune mechanism: Secondary | ICD-10-CM | POA: Insufficient documentation

## 2015-07-06 DIAGNOSIS — Z8546 Personal history of malignant neoplasm of prostate: Secondary | ICD-10-CM | POA: Diagnosis not present

## 2015-07-06 DIAGNOSIS — Z8701 Personal history of pneumonia (recurrent): Secondary | ICD-10-CM | POA: Diagnosis not present

## 2015-07-06 DIAGNOSIS — R05 Cough: Secondary | ICD-10-CM | POA: Insufficient documentation

## 2015-07-06 DIAGNOSIS — G8929 Other chronic pain: Secondary | ICD-10-CM | POA: Insufficient documentation

## 2015-07-06 DIAGNOSIS — C9000 Multiple myeloma not having achieved remission: Secondary | ICD-10-CM | POA: Diagnosis not present

## 2015-07-06 DIAGNOSIS — R011 Cardiac murmur, unspecified: Secondary | ICD-10-CM | POA: Diagnosis not present

## 2015-07-06 DIAGNOSIS — R059 Cough, unspecified: Secondary | ICD-10-CM

## 2015-07-06 MED ORDER — PHENYLEPHRINE-CHLORPHEN-DM 10-4-12.5 MG/5ML PO LIQD: 10.0000 mL | Freq: Three times a day (TID) | ORAL | Status: DC | PRN

## 2015-07-06 MED ORDER — PREDNISONE 20 MG PO TABS
40.0000 mg | ORAL_TABLET | Freq: Every day | ORAL | Status: AC
Start: 2015-07-07 — End: 2015-07-10

## 2015-07-06 MED ORDER — PREDNISONE 20 MG PO TABS
60.0000 mg | ORAL_TABLET | ORAL | Status: AC
  Administered 2015-07-06: 60 mg via ORAL
  Filled 2015-07-06: qty 3

## 2015-07-06 NOTE — ED Notes (Signed)
Pt here with cough/congestion x 1 week.  Unknown for fever.  Also wants pain meds for chronic pain condition.

## 2015-07-06 NOTE — Discharge Instructions (Signed)
As discussed, your evaluation today has been largely reassuring.  But, it is important that you monitor your condition carefully, and do not hesitate to return to the ED if you develop new, or concerning changes in your condition.  Otherwise, please follow-up with your physician for appropriate ongoing care.   Cough, Adult A cough helps to clear your throat and lungs. A cough may last only 2-3 weeks (acute), or it may last longer than 8 weeks (chronic). Many different things can cause a cough. A cough may be a sign of an illness or another medical condition. HOME CARE  Pay attention to any changes in your cough.  Take medicines only as told by your doctor.  If you were prescribed an antibiotic medicine, take it as told by your doctor. Do not stop taking it even if you start to feel better.  Talk with your doctor before you try using a cough medicine.  Drink enough fluid to keep your pee (urine) clear or pale yellow.  If the air is dry, use a cold steam vaporizer or humidifier in your home.  Stay away from things that make you cough at work or at home.  If your cough is worse at night, try using extra pillows to raise your head up higher while you sleep.  Do not smoke, and try not to be around smoke. If you need help quitting, ask your doctor.  Do not have caffeine.  Do not drink alcohol.  Rest as needed. GET HELP IF:  You have new problems (symptoms).  You cough up yellow fluid (pus).  Your cough does not get better after 2-3 weeks, or your cough gets worse.  Medicine does not help your cough and you are not sleeping well.  You have pain that gets worse or pain that is not helped with medicine.  You have a fever.  You are losing weight and you do not know why.  You have night sweats. GET HELP RIGHT AWAY IF:  You cough up blood.  You have trouble breathing.  Your heartbeat is very fast.   This information is not intended to replace advice given to you by your  health care provider. Make sure you discuss any questions you have with your health care provider.   Document Released: 03/14/2011 Document Revised: 03/22/2015 Document Reviewed: 09/07/2014 Elsevier Interactive Patient Education [employer].
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
## 2015-07-06 NOTE — ED Notes (Signed)
MD at bedside. 

## 2015-07-25 ENCOUNTER — Ambulatory Visit (HOSPITAL_BASED_OUTPATIENT_CLINIC_OR_DEPARTMENT_OTHER): Payer: Medicare Other

## 2015-07-25 VITALS — BP 154/85 | HR 63 | Temp 98.2°F | Resp 18

## 2015-07-25 DIAGNOSIS — Z452 Encounter for adjustment and management of vascular access device: Secondary | ICD-10-CM | POA: Diagnosis not present

## 2015-07-25 DIAGNOSIS — C9000 Multiple myeloma not having achieved remission: Secondary | ICD-10-CM

## 2015-07-25 DIAGNOSIS — Z95828 Presence of other vascular implants and grafts: Secondary | ICD-10-CM

## 2015-07-25 MED ORDER — HEPARIN SOD (PORK) LOCK FLUSH 100 UNIT/ML IV SOLN
500.0000 [IU] | Freq: Once | INTRAVENOUS | Status: AC
  Administered 2015-07-25: 500 [IU] via INTRAVENOUS
  Filled 2015-07-25: qty 5

## 2015-07-25 MED ORDER — SODIUM CHLORIDE 0.9 % IJ SOLN
10.0000 mL | INTRAMUSCULAR | Status: DC | PRN
  Administered 2015-07-25: 10 mL via INTRAVENOUS
  Filled 2015-07-25: qty 10

## 2015-08-04 DIAGNOSIS — Z8546 Personal history of malignant neoplasm of prostate: Secondary | ICD-10-CM | POA: Diagnosis not present

## 2015-08-04 DIAGNOSIS — M4806 Spinal stenosis, lumbar region: Secondary | ICD-10-CM | POA: Diagnosis not present

## 2015-08-04 DIAGNOSIS — M5416 Radiculopathy, lumbar region: Secondary | ICD-10-CM | POA: Diagnosis not present

## 2015-08-15 MED FILL — LOSARTAN POTASSIUM 100 MG T: 100 | 30 days supply | Qty: 30 | Fill #2

## 2015-08-16 MED FILL — OXYCODONE/APAP 10/325 MG TA: 10-325 | 15 days supply | Qty: 60 | Fill #0

## 2015-09-15 DIAGNOSIS — C61 Malignant neoplasm of prostate: Secondary | ICD-10-CM | POA: Diagnosis not present

## 2015-09-15 DIAGNOSIS — D649 Anemia, unspecified: Secondary | ICD-10-CM | POA: Diagnosis not present

## 2015-09-15 DIAGNOSIS — Z8719 Personal history of other diseases of the digestive system: Secondary | ICD-10-CM | POA: Diagnosis not present

## 2015-09-15 DIAGNOSIS — J309 Allergic rhinitis, unspecified: Secondary | ICD-10-CM | POA: Diagnosis not present

## 2015-09-15 DIAGNOSIS — C9000 Multiple myeloma not having achieved remission: Secondary | ICD-10-CM | POA: Diagnosis not present

## 2015-09-15 DIAGNOSIS — I10 Essential (primary) hypertension: Secondary | ICD-10-CM | POA: Diagnosis not present

## 2015-09-15 DIAGNOSIS — M5416 Radiculopathy, lumbar region: Secondary | ICD-10-CM | POA: Diagnosis not present

## 2015-09-15 MED FILL — OXYCODONE-APAP 10-325 TAB: 10-325 | 15 days supply | Qty: 60 | Fill #0

## 2015-09-15 MED FILL — LOSARTAN POTASSIUM 100 MG T: 100 | 30 days supply | Qty: 30 | Fill #0

## 2015-09-19 ENCOUNTER — Ambulatory Visit (HOSPITAL_BASED_OUTPATIENT_CLINIC_OR_DEPARTMENT_OTHER): Payer: Medicare Other

## 2015-09-19 ENCOUNTER — Other Ambulatory Visit (HOSPITAL_BASED_OUTPATIENT_CLINIC_OR_DEPARTMENT_OTHER): Payer: Medicare Other

## 2015-09-19 ENCOUNTER — Telehealth: Payer: Self-pay | Admitting: Oncology

## 2015-09-19 ENCOUNTER — Ambulatory Visit (HOSPITAL_BASED_OUTPATIENT_CLINIC_OR_DEPARTMENT_OTHER): Payer: Medicare Other | Admitting: Oncology

## 2015-09-19 VITALS — BP 154/86 | HR 70 | Temp 98.4°F | Resp 18 | Wt 163.1 lb

## 2015-09-19 DIAGNOSIS — Z8546 Personal history of malignant neoplasm of prostate: Secondary | ICD-10-CM | POA: Diagnosis not present

## 2015-09-19 DIAGNOSIS — C9001 Multiple myeloma in remission: Secondary | ICD-10-CM

## 2015-09-19 DIAGNOSIS — Z95828 Presence of other vascular implants and grafts: Secondary | ICD-10-CM

## 2015-09-19 DIAGNOSIS — C61 Malignant neoplasm of prostate: Secondary | ICD-10-CM | POA: Diagnosis not present

## 2015-09-19 DIAGNOSIS — M545 Low back pain: Secondary | ICD-10-CM | POA: Diagnosis not present

## 2015-09-19 DIAGNOSIS — C9000 Multiple myeloma not having achieved remission: Secondary | ICD-10-CM | POA: Diagnosis not present

## 2015-09-19 LAB — CBC WITH DIFFERENTIAL/PLATELET
BASO%: 0.6 % (ref 0.0–2.0)
BASOS ABS: 0 10*3/uL (ref 0.0–0.1)
EOS ABS: 0 10*3/uL (ref 0.0–0.5)
EOS%: 0.8 % (ref 0.0–7.0)
HCT: 39.8 % (ref 38.4–49.9)
HGB: 13.1 g/dL (ref 13.0–17.1)
LYMPH%: 38.6 % (ref 14.0–49.0)
MCH: 31.8 pg (ref 27.2–33.4)
MCHC: 32.9 g/dL (ref 32.0–36.0)
MCV: 96.7 fL (ref 79.3–98.0)
MONO#: 0.2 10*3/uL (ref 0.1–0.9)
MONO%: 6.9 % (ref 0.0–14.0)
NEUT%: 53.1 % (ref 39.0–75.0)
NEUTROS ABS: 1.8 10*3/uL (ref 1.5–6.5)
PLATELETS: 152 10*3/uL (ref 140–400)
RBC: 4.12 10*6/uL — AB (ref 4.20–5.82)
RDW: 13.2 % (ref 11.0–14.6)
WBC: 3.5 10*3/uL — ABNORMAL LOW (ref 4.0–10.3)
lymph#: 1.3 10*3/uL (ref 0.9–3.3)

## 2015-09-19 LAB — COMPREHENSIVE METABOLIC PANEL
ALT: 23 U/L (ref 0–55)
ANION GAP: 10 meq/L (ref 3–11)
AST: 24 U/L (ref 5–34)
Albumin: 4.1 g/dL (ref 3.5–5.0)
Alkaline Phosphatase: 80 U/L (ref 40–150)
BUN: 13.8 mg/dL (ref 7.0–26.0)
CO2: 22 meq/L (ref 22–29)
Calcium: 9.4 mg/dL (ref 8.4–10.4)
Chloride: 104 mEq/L (ref 98–109)
Creatinine: 0.9 mg/dL (ref 0.7–1.3)
Glucose: 149 mg/dl — ABNORMAL HIGH (ref 70–140)
Potassium: 4.2 mEq/L (ref 3.5–5.1)
Sodium: 136 mEq/L (ref 136–145)
TOTAL PROTEIN: 7.4 g/dL (ref 6.4–8.3)

## 2015-09-19 MED ORDER — HEPARIN SOD (PORK) LOCK FLUSH 100 UNIT/ML IV SOLN
500.0000 [IU] | Freq: Once | INTRAVENOUS | Status: AC
  Administered 2015-09-19: 500 [IU] via INTRAVENOUS
  Filled 2015-09-19: qty 5

## 2015-09-19 MED ORDER — SODIUM CHLORIDE 0.9% FLUSH
10.0000 mL | INTRAVENOUS | Status: DC | PRN
  Administered 2015-09-19: 10 mL via INTRAVENOUS
  Filled 2015-09-19: qty 10

## 2015-09-19 NOTE — Telephone Encounter (Signed)
Gave and printed appt sched and avs for pt for march thru [facility]

## 2015-09-19 NOTE — Progress Notes (Signed)
Hematology and Oncology Follow Up Visit  Travis Nguyen [id number] [birth year] 65 y.o. 09/19/2015 9:24 AM Travis Contras [name], [name], MD   Principle Diagnosis: 65 year old gentleman with the following diagnoses:  1. 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 Initially at smoldering subtype of 11% plasma cell in the bone marrow. He subsequently developed active multiple myeloma in February of 2014 with a 23% plasma cell infiltration. 2. Prostate cancer diagnosed in May of 2014. He presented with an elevated PSA of 13.2 stage TI C. clinical staging. His Gleason score was 3+4 equals 7.  Prior Therapy: He was treated initially with Cytoxan, Velcade and Decadron and subsequently his regimen changed to a Velcade, Revlimid with dexamethasone. He had an excellent response with that an M spike that is no longer detected an IgG level within normal range at April 2015. He did not have any poor prognostic features on his cytogenetics.   He is status post a robotic-assisted laparoscopic radical prostatectomy and bilateral lymph node dissection on 02/09/2014. The final pathology showed prostate adenocarcinoma Gleason score 4+3 equals 7 involving both lobes. Extraprostatic extension was present. The bladder neck margin was negative. Zero out of two lymph nodes were involved.  Current therapy:  He is on observation and surveillance.  Interim History:  [name] presents today for a followup visit. Since the last visit, he underwent an operation on his lower back for degenerative disc disease. He tolerated the procedure well without any complications. Despite the operation, he continues to have lower back pain but certainly improved. He is ambulating without any difficulties. Has not reported any falls or syncope.  He continues to be very functional and performs activities of daily living. He does not report any neurological deficits. Does not report any peripheral neuropathy or weakness. He  still has some incontinence issue that has improved related to his prostate surgery. He does not report any hematuria, dysuria or worsening genitourinary complaints.  He did not report any fevers or chills or sweats. Does not report any hematuria or dysuria. Does not report any headaches or blurry vision or double vision. Does not report any syncope or seizures. He does not report any skeletal complaints of arthralgias or myalgias. He does not report any chest pain, shortness of breath, palpitation orthopnea or PND. He does not report any shortness of breath or wheezing. His performance status and activity level remains excellent. Rest of his review of systems unremarkable.  Medications: I have reviewed the patient's current medications.  Current Outpatient Prescriptions  Medication Sig Dispense Refill  . losartan (COZAAR) 100 MG tablet Take 100 mg by mouth every morning.     [name] oxycodone (ROXICODONE) 30 MG immediate release tablet Take 30 mg by mouth every 6 (six) hours as needed for pain (For back pain.).     [name] acetaminophen (TYLENOL) 500 MG tablet Take 500-1,000 mg by mouth every 6 (six) hours as needed (For pain.). Reported on 09/19/2015    . lidocaine-prilocaine (EMLA) cream Apply 1 application topically daily as needed (Applies to port-a-cath.). (Patient not taking: Reported on 09/19/2015) 30 g 1  . Phenylephrine-Chlorphen-DM 04-18-11.5 MG/5ML LIQD Take 10 mLs by mouth 3 (three) times daily as needed. (Patient not taking: Reported on 09/19/2015) 100 mL 0  . [DISCONTINUED] pantoprazole (PROTONIX) 40 MG tablet Take 1 tablet (40 mg total) by mouth daily. (Patient not taking: Reported on 07/24/2014) 30 tablet 0   No current facility-administered medications for this visit.   Facility-Administered Medications Ordered in Other Visits  Medication Dose Route Frequency Provider Last Rate Last Dose  . sodium chloride flush (NS) 0.9 % injection 10 mL  10 mL Intravenous PRN Wyatt Portela, MD   10 mL at 09/19/15  1308     Allergies: No Known Allergies  Past Medical History, Surgical history, Social history, and Family History were reviewed and updated.   Physical Exam: Blood pressure 154/86, pulse 70, temperature 98.4 F (36.9 C), temperature source Oral, resp. rate 18, weight 163 lb 1.6 oz (73.982 kg), SpO2 100 %. ECOG: 1 General appearance: alert and cooperative not in any distress. Head: Normocephalic, without obvious abnormality no oral ulcers or lesions. Neck: no adenopathy Lymph nodes: Cervical, supraclavicular, and axillary nodes normal. Heart:regular rate and rhythm, S1, S2 normal, no murmur, click, rub or gallop Lung:chest clear, no wheezing, rales, normal symmetric air entry Abdomin: soft, non-tender, without masses or organomegaly no shifting dullness or ascites. EXT:no erythema, induration, or nodules Neurological exam showed no deficits or lesions.  Lab Results: Lab Results  Component Value Date   WBC 3.5* 09/19/2015   HGB 13.1 09/19/2015   HCT 39.8 09/19/2015   MCV 96.7 09/19/2015   PLT 152 09/19/2015     Chemistry      Component Value Date/Time   NA 137 03/21/2015 0923   NA 138 08/04/2014 1357   K 5.0 03/21/2015 0923   K 4.0 08/04/2014 1357   CL 106 08/04/2014 1357   CL 105 01/01/2013 0835   CO2 25 03/21/2015 0923   CO2 26 08/04/2014 1357   BUN 11.5 03/21/2015 0923   BUN 15 08/04/2014 1357   CREATININE 0.9 03/21/2015 0923   CREATININE 0.85 08/04/2014 1357   CREATININE (Revised) 0.92 07/24/2009 1449      Component Value Date/Time   CALCIUM 9.1 03/21/2015 0923   CALCIUM 9.0 08/04/2014 1357   ALKPHOS 78 03/21/2015 0923   ALKPHOS 107 05/30/2014 0614   AST 23 03/21/2015 0923   AST 197* 05/30/2014 0614   ALT 19 03/21/2015 0923   ALT 186* 05/30/2014 0614   BILITOT 0.46 03/21/2015 0923   BILITOT 0.4 05/30/2014 0614     Results for HAPPY, [place] (MRN [id number]) as of 09/19/2015 09:15  Ref. Range 03/09/2014 08:19 06/14/2014 15:09 03/21/2015 09:23  IgG  (Immunoglobin G), Serum Latest Ref Range: [phone] mg/dL 1030 1020 1030  IgA Latest Ref Range: 68-379 mg/dL 144 141 175  IgM, Serum Latest Ref Range: 41-251 mg/dL 85 83 109  Total Protein, Serum Electrophoresis Latest Ref Range: 6.1-8.1 g/dL  6.9 6.8  Kappa free light chain Latest Ref Range: 0.33-1.94 mg/dL 1.27 1.34 2.05 (H)  Lambda Free Lght Chn Latest Ref Range: 0.57-2.63 mg/dL 1.10 0.97 0.97  Kappa:Lambda Ratio Latest Ref Range: 0.26-1.65  1.15 1.38 2.11 (H)    Impression and Plan:  65 year old gentleman with the following issues  1.IgG Kappa Multiple Myeloma: His last protein studies on December 2015 showed complete response without any evidence of an M spike. His quantitative immunoglobulins are all within normal range. He has been off Revlimid since his episode of small bowel obstruction in October 2015. He refused autologous stem cell transplant because of toxicities and complication.   His protein studies have reviewed from September 2016 continues to indicate remission. The plan is to continue with active surveillance and repeat protein studies and skeletal survey and September 2017. Salvage regimen will be used upon symptomatic progression.   2. Prostate cancer: He is status post prostatectomy on 02/09/2014. His pathology showed a Gleason score  4+3 equals 7 with disease involving both lobes. He has extraprostatic extension but no lymph node involvement. He has no signs or symptoms of recurrence. Last PSA done by urology is undetectable.  3. Chronic back pain: He is on oxycodone every 12 hours seems to be manageable. He is dose post surgery which have improved his pain but does have to take pain medication as well as well.  3. Port-A-Cath flush: This will be arranged every 2 months. Risks and benefits of removing the Port-A-Cath will reviewed today and he elected to keep it given his poor venous access.  4. Followup: Will be in 6 months.    [facility], MD 3/7/20179:24 AM

## 2015-09-19 NOTE — Patient Instructions (Signed)

## 2015-09-20 LAB — KAPPA/LAMBDA LIGHT CHAINS
IG KAPPA FREE LIGHT CHAIN: 17.64 mg/L (ref 3.30–19.40)
IG LAMBDA FREE LIGHT CHAIN: 13.86 mg/L (ref 5.71–26.30)
KAPPA/LAMBDA FLC RATIO: 1.27 (ref 0.26–1.65)

## 2015-09-21 LAB — MULTIPLE MYELOMA PANEL, SERUM
Albumin SerPl Elph-Mcnc: 3.9 g/dL (ref 2.9–4.4)
Albumin/Glob SerPl: 1.4 (ref 0.7–1.7)
Alpha 1: 0.2 g/dL (ref 0.0–0.4)
Alpha2 Glob SerPl Elph-Mcnc: 0.8 g/dL (ref 0.4–1.0)
B-Globulin SerPl Elph-Mcnc: 0.9 g/dL (ref 0.7–1.3)
Gamma Glob SerPl Elph-Mcnc: 1 g/dL (ref 0.4–1.8)
Globulin, Total: 2.9 g/dL (ref 2.2–3.9)
IgA, Qn, Serum: 159 mg/dL (ref 61–437)
IgG, Qn, Serum: 957 mg/dL (ref 700–1600)
IgM, Qn, Serum: 108 mg/dL (ref 20–172)
Total Protein: 6.8 g/dL (ref 6.0–8.5)

## 2015-10-02 ENCOUNTER — Emergency Department (HOSPITAL_COMMUNITY)
Admission: EM | Admit: 2015-10-02 | Discharge: 2015-10-02 | Disposition: A | Discharge status: Home / Self Care | Payer: Medicare Other | Attending: Emergency Medicine | Admitting: Emergency Medicine

## 2015-10-02 ENCOUNTER — Encounter (HOSPITAL_COMMUNITY): Payer: Self-pay | Admitting: Emergency Medicine

## 2015-10-02 DIAGNOSIS — R200 Anesthesia of skin: Secondary | ICD-10-CM | POA: Diagnosis not present

## 2015-10-02 DIAGNOSIS — R011 Cardiac murmur, unspecified: Secondary | ICD-10-CM | POA: Diagnosis not present

## 2015-10-02 DIAGNOSIS — I10 Essential (primary) hypertension: Secondary | ICD-10-CM | POA: Insufficient documentation

## 2015-10-02 NOTE — ED Notes (Signed)
Pt c/o R foot numbness x 2 months. Pt sts he had a back surgery a few months back. Pt denies pain in foot or diabetes. Pt has hx of bone cancer and prostate cancer. A&Ox4 and ambulatory. No other complaints.

## 2015-10-07 ENCOUNTER — Encounter (HOSPITAL_COMMUNITY): Payer: Self-pay

## 2015-10-07 ENCOUNTER — Emergency Department (HOSPITAL_COMMUNITY)
Admission: EM | Admit: 2015-10-07 | Discharge: 2015-10-07 | Disposition: A | Discharge status: Home / Self Care | Payer: Medicare Other | Attending: Emergency Medicine | Admitting: Emergency Medicine

## 2015-10-07 DIAGNOSIS — Z8579 Personal history of other malignant neoplasms of lymphoid, hematopoietic and related tissues: Secondary | ICD-10-CM | POA: Insufficient documentation

## 2015-10-07 DIAGNOSIS — Z8701 Personal history of pneumonia (recurrent): Secondary | ICD-10-CM | POA: Insufficient documentation

## 2015-10-07 DIAGNOSIS — M1990 Unspecified osteoarthritis, unspecified site: Secondary | ICD-10-CM | POA: Insufficient documentation

## 2015-10-07 DIAGNOSIS — Z8719 Personal history of other diseases of the digestive system: Secondary | ICD-10-CM | POA: Diagnosis not present

## 2015-10-07 DIAGNOSIS — R011 Cardiac murmur, unspecified: Secondary | ICD-10-CM | POA: Insufficient documentation

## 2015-10-07 DIAGNOSIS — Z85820 Personal history of malignant melanoma of skin: Secondary | ICD-10-CM | POA: Insufficient documentation

## 2015-10-07 DIAGNOSIS — M549 Dorsalgia, unspecified: Secondary | ICD-10-CM | POA: Insufficient documentation

## 2015-10-07 DIAGNOSIS — Z8546 Personal history of malignant neoplasm of prostate: Secondary | ICD-10-CM | POA: Diagnosis not present

## 2015-10-07 DIAGNOSIS — G8929 Other chronic pain: Secondary | ICD-10-CM | POA: Diagnosis not present

## 2015-10-07 DIAGNOSIS — I10 Essential (primary) hypertension: Secondary | ICD-10-CM | POA: Insufficient documentation

## 2015-10-07 DIAGNOSIS — Z862 Personal history of diseases of the blood and blood-forming organs and certain disorders involving the immune mechanism: Secondary | ICD-10-CM | POA: Diagnosis not present

## 2015-10-07 DIAGNOSIS — R200 Anesthesia of skin: Secondary | ICD-10-CM | POA: Diagnosis not present

## 2015-10-07 DIAGNOSIS — Z87891 Personal history of nicotine dependence: Secondary | ICD-10-CM | POA: Diagnosis not present

## 2015-10-07 NOTE — ED Notes (Signed)
Patient left AMA.

## 2015-10-07 NOTE — ED Notes (Signed)
Per registration pt states "was only here for an XRAY and does not want to wait anymore."

## 2015-10-07 NOTE — ED Notes (Signed)
Pt has had numbness to rt anterior foot x 2 months.  Here 5 days ago but did not stay to be seen.  Not a diabetic.  No injury

## 2015-10-07 NOTE — ED Notes (Signed)
Triage nurse Margarita Rana convinced pt to stay as pt walking to car.

## 2015-10-07 NOTE — ED Notes (Addendum)
Pt states he did not want to wait to be seen, Encouraged pt to stay, pt stated he would,but then walked out.

## 2015-10-07 NOTE — ED Notes (Signed)
Pt reports decrease sensation to right foot toes; reports feeling but decreased compared to left foot toes. Ongoing for 2 months.

## 2015-10-07 NOTE — Discharge Instructions (Signed)
Please follow-up with the orthopedic clinic listed above or your regular orthopedic doctor. Please call them on Monday morning to schedule your follow-up appointment. Continue usual home medications. Return to the ER for any new or worsening symptoms, any additional concerns.

## 2015-10-07 NOTE — ED Notes (Addendum)
Patient refused nurse to take his vital signs.   Patient left AMA
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
## 2015-10-16 MED FILL — OXYCODONE-APAP 10-325 TAB: 10-325 | 30 days supply | Qty: 60 | Fill #0

## 2015-11-13 ENCOUNTER — Other Ambulatory Visit: Payer: Self-pay

## 2015-11-13 MED FILL — LOSARTAN POTASSIUM 100 MG T: 100 | 30 days supply | Qty: 30 | Fill #1

## 2015-11-14 MED FILL — OXYCODONE-APAP 10-325 TAB: 10-325 | 15 days supply | Qty: 60 | Fill #0

## 2015-12-14 MED FILL — OXYCODONE-APAP 10-325 TAB: 10-325 | 15 days supply | Qty: 60 | Fill #0

## 2015-12-27 ENCOUNTER — Emergency Department (HOSPITAL_COMMUNITY)
Admission: EM | Admit: 2015-12-27 | Discharge: 2015-12-27 | Disposition: A | Discharge status: Home / Self Care | Payer: Medicare Other | Source: Home / Self Care | Attending: Emergency Medicine | Admitting: Emergency Medicine

## 2015-12-27 ENCOUNTER — Encounter (HOSPITAL_COMMUNITY): Payer: Self-pay | Admitting: Emergency Medicine

## 2015-12-27 ENCOUNTER — Emergency Department (HOSPITAL_COMMUNITY): Payer: Medicare Other

## 2015-12-27 ENCOUNTER — Encounter (HOSPITAL_COMMUNITY): Payer: Self-pay

## 2015-12-27 ENCOUNTER — Inpatient Hospital Stay (HOSPITAL_COMMUNITY)
Admission: EM | Admit: 2015-12-27 | Discharge: 2015-12-29 | DRG: 390 | Disposition: A | Discharge status: Home / Self Care | Payer: Medicare Other | Attending: Internal Medicine | Admitting: Internal Medicine

## 2015-12-27 DIAGNOSIS — Z87891 Personal history of nicotine dependence: Secondary | ICD-10-CM

## 2015-12-27 DIAGNOSIS — R1084 Generalized abdominal pain: Secondary | ICD-10-CM

## 2015-12-27 DIAGNOSIS — K565 Intestinal adhesions [bands] with obstruction (postprocedural) (postinfection): Secondary | ICD-10-CM | POA: Diagnosis present

## 2015-12-27 DIAGNOSIS — M549 Dorsalgia, unspecified: Secondary | ICD-10-CM | POA: Diagnosis present

## 2015-12-27 DIAGNOSIS — C61 Malignant neoplasm of prostate: Secondary | ICD-10-CM | POA: Diagnosis present

## 2015-12-27 DIAGNOSIS — R011 Cardiac murmur, unspecified: Secondary | ICD-10-CM

## 2015-12-27 DIAGNOSIS — Z9079 Acquired absence of other genital organ(s): Secondary | ICD-10-CM

## 2015-12-27 DIAGNOSIS — Z8546 Personal history of malignant neoplasm of prostate: Secondary | ICD-10-CM

## 2015-12-27 DIAGNOSIS — K5660 Unspecified intestinal obstruction: Secondary | ICD-10-CM | POA: Diagnosis not present

## 2015-12-27 DIAGNOSIS — G8929 Other chronic pain: Secondary | ICD-10-CM | POA: Diagnosis present

## 2015-12-27 DIAGNOSIS — I10 Essential (primary) hypertension: Secondary | ICD-10-CM | POA: Diagnosis present

## 2015-12-27 DIAGNOSIS — Z8249 Family history of ischemic heart disease and other diseases of the circulatory system: Secondary | ICD-10-CM

## 2015-12-27 DIAGNOSIS — K56609 Unspecified intestinal obstruction, unspecified as to partial versus complete obstruction: Secondary | ICD-10-CM | POA: Diagnosis present

## 2015-12-27 DIAGNOSIS — Z79899 Other long term (current) drug therapy: Secondary | ICD-10-CM | POA: Diagnosis not present

## 2015-12-27 DIAGNOSIS — K5900 Constipation, unspecified: Secondary | ICD-10-CM | POA: Diagnosis present

## 2015-12-27 DIAGNOSIS — K5669 Other intestinal obstruction: Secondary | ICD-10-CM | POA: Diagnosis not present

## 2015-12-27 LAB — COMPREHENSIVE METABOLIC PANEL
ALBUMIN: 4.7 g/dL (ref 3.5–5.0)
ALK PHOS: 58 U/L (ref 38–126)
ALT: 23 U/L (ref 17–63)
ALT: 30 U/L (ref 17–63)
ANION GAP: 8 (ref 5–15)
AST: 34 U/L (ref 15–41)
AST: 38 U/L (ref 15–41)
Albumin: 4.6 g/dL (ref 3.5–5.0)
Alkaline Phosphatase: 59 U/L (ref 38–126)
Anion gap: 10 (ref 5–15)
BUN: 16 mg/dL (ref 6–20)
BUN: 15 mg/dL (ref 6–20)
CALCIUM: 9.8 mg/dL (ref 8.9–10.3)
CHLORIDE: 100 mmol/L — AB (ref 101–111)
CO2: 26 mmol/L (ref 22–32)
CO2: 25 mmol/L (ref 22–32)
CREATININE: 0.99 mg/dL (ref 0.61–1.24)
Calcium: 10.2 mg/dL (ref 8.9–10.3)
Chloride: 102 mmol/L (ref 101–111)
Creatinine, Ser: 1.03 mg/dL (ref 0.61–1.24)
GFR calc Af Amer: >60 mL/min (ref >60)
GFR calc non Af Amer: >60 mL/min (ref >60)
GFR calc non Af Amer: >60 mL/min (ref >60)
Glucose, Bld: 127 mg/dL — ABNORMAL HIGH (ref 65–99)
Glucose, Bld: 143 mg/dL — ABNORMAL HIGH (ref 65–99)
POTASSIUM: 4.7 mmol/L (ref 3.5–5.1)
Potassium: 4.6 mmol/L (ref 3.5–5.1)
SODIUM: 136 mmol/L (ref 135–145)
SODIUM: 135 mmol/L (ref 135–145)
Total Bilirubin: 0.9 mg/dL (ref 0.3–1.2)
Total Bilirubin: 0.6 mg/dL (ref 0.3–1.2)
Total Protein: 8.1 g/dL (ref 6.5–8.1)
Total Protein: 8.1 g/dL (ref 6.5–8.1)

## 2015-12-27 LAB — URINALYSIS, ROUTINE W REFLEX MICROSCOPIC
Bilirubin Urine: NEGATIVE
Bilirubin Urine: NEGATIVE
GLUCOSE, UA: NEGATIVE mg/dL
Glucose, UA: NEGATIVE mg/dL
HGB URINE DIPSTICK: NEGATIVE
HGB URINE DIPSTICK: NEGATIVE
Ketones, ur: NEGATIVE mg/dL
Ketones, ur: NEGATIVE mg/dL
Leukocytes, UA: NEGATIVE
Leukocytes, UA: NEGATIVE
NITRITE: NEGATIVE
Nitrite: NEGATIVE
PH: 5 (ref 5.0–8.0)
Protein, ur: NEGATIVE mg/dL
Protein, ur: NEGATIVE mg/dL
SPECIFIC GRAVITY, URINE: 1.027 (ref 1.005–1.030)
Specific Gravity, Urine: 1.031 — ABNORMAL HIGH (ref 1.005–1.030)
pH: 5.5 (ref 5.0–8.0)

## 2015-12-27 LAB — CBC
HCT: 43.6 % (ref 39.0–52.0)
HEMATOCRIT: 42.5 % (ref 39.0–52.0)
HEMOGLOBIN: 14.6 g/dL (ref 13.0–17.0)
Hemoglobin: 14.7 g/dL (ref 13.0–17.0)
MCH: 32.1 pg (ref 26.0–34.0)
MCH: 31.5 pg (ref 26.0–34.0)
MCHC: 34.4 g/dL (ref 30.0–36.0)
MCHC: 33.7 g/dL (ref 30.0–36.0)
MCV: 93.4 fL (ref 78.0–100.0)
MCV: 93.4 fL (ref 78.0–100.0)
PLATELETS: 161 10*3/uL (ref 150–400)
Platelets: 167 10*3/uL (ref 150–400)
RBC: 4.55 MIL/uL (ref 4.22–5.81)
RBC: 4.67 MIL/uL (ref 4.22–5.81)
RDW: 13.3 % (ref 11.5–15.5)
RDW: 13.4 % (ref 11.5–15.5)
WBC: 8.4 10*3/uL (ref 4.0–10.5)
WBC: 6.7 10*3/uL (ref 4.0–10.5)

## 2015-12-27 LAB — LIPASE, BLOOD
LIPASE: 28 U/L (ref 11–51)
LIPASE: 25 U/L (ref 11–51)

## 2015-12-27 MED ORDER — MORPHINE SULFATE (PF) 4 MG/ML IV SOLN
4.0000 mg | Freq: Once | INTRAVENOUS | Status: AC
Start: 2015-12-27 — End: 2015-12-27
  Administered 2015-12-27: 4 mg via INTRAVENOUS
  Filled 2015-12-27: qty 1

## 2015-12-27 MED ORDER — SODIUM CHLORIDE 0.9 % IV SOLN
INTRAVENOUS | Status: DC
  Administered 2015-12-27 – 2015-12-28 (×2): via INTRAVENOUS

## 2015-12-27 MED ORDER — FENTANYL CITRATE (PF) 100 MCG/2ML IJ SOLN
50.0000 ug | INTRAMUSCULAR | Status: DC | PRN
Start: 2015-12-27 — End: 2015-12-27
  Administered 2015-12-27: 50 ug via INTRAVENOUS
  Filled 2015-12-27: qty 2

## 2015-12-27 MED ORDER — ENOXAPARIN SODIUM 40 MG/0.4ML ~~LOC~~ SOLN
40.0000 mg | Freq: Every day | SUBCUTANEOUS | Status: DC
  Administered 2015-12-28 – 2015-12-29 (×2): 40 mg via SUBCUTANEOUS
  Filled 2015-12-27 (×2): qty 0.4

## 2015-12-27 MED ORDER — IOPAMIDOL (ISOVUE-300) INJECTION 61%
INTRAVENOUS | Status: AC
  Administered 2015-12-27: 100 mL
  Filled 2015-12-27: qty 100

## 2015-12-27 MED ORDER — MORPHINE SULFATE (PF) 2 MG/ML IV SOLN
2.0000 mg | INTRAVENOUS | Status: DC | PRN
  Administered 2015-12-28: 4 mg via INTRAVENOUS
  Administered 2015-12-28 – 2015-12-29 (×2): 2 mg via INTRAVENOUS
  Filled 2015-12-27: qty 1
  Filled 2015-12-27: qty 2
  Filled 2015-12-27: qty 1

## 2015-12-27 MED ORDER — ONDANSETRON HCL 4 MG/2ML IJ SOLN
4.0000 mg | Freq: Once | INTRAMUSCULAR | Status: AC
  Administered 2015-12-27: 4 mg via INTRAVENOUS
  Filled 2015-12-27: qty 2

## 2015-12-27 NOTE — ED Notes (Signed)
Patient states he went to [facility] today and not able to stand pain so came to [facility] ED.

## 2015-12-27 NOTE — ED Notes (Signed)
Gave report to Floor RN, made RN aware that patient needs OG placed when arrival to floor.
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
## 2015-12-27 NOTE — ED Notes (Signed)
Bed: [id number] Expected date:  Expected time:  Means of arrival:  Comments: 65 yo M  abd pain

## 2015-12-27 NOTE — Discharge Instructions (Signed)

## 2015-12-27 NOTE — ED Notes (Signed)
Reports mid lower abd pain since last night.  Seen at [facility] this morning.  Reports pain is worse with nausea and 1 episode of vomiting.  Denies diarrhea.

## 2015-12-27 NOTE — ED Notes (Signed)
Per EMS- Generalized abd pain started after he ate dinner, more greasy meal than usual. Tenderness, no distension. No N/V/D.

## 2015-12-27 NOTE — ED Notes (Signed)
Pt ambulated to room from waiting room. 
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
## 2015-12-27 NOTE — ED Notes (Signed)
Pt left before receiving discharge instructions.

## 2015-12-28 ENCOUNTER — Encounter (HOSPITAL_COMMUNITY): Payer: Self-pay | Admitting: General Surgery

## 2015-12-28 DIAGNOSIS — C61 Malignant neoplasm of prostate: Secondary | ICD-10-CM

## 2015-12-28 DIAGNOSIS — K5669 Other intestinal obstruction: Secondary | ICD-10-CM

## 2015-12-28 MED ORDER — KCL IN DEXTROSE-NACL 20-5-0.45 MEQ/L-%-% IV SOLN
INTRAVENOUS | Status: DC
  Administered 2015-12-28 – 2015-12-29 (×2): via INTRAVENOUS
  Filled 2015-12-28: qty 1000

## 2015-12-28 MED ORDER — ONDANSETRON HCL 4 MG/2ML IJ SOLN: 4.0000 mg | Freq: Three times a day (TID) | INTRAMUSCULAR | Status: AC | PRN

## 2015-12-28 MED ORDER — CETYLPYRIDINIUM CHLORIDE 0.05 % MT LIQD
7.0000 mL | Freq: Two times a day (BID) | OROMUCOSAL | Status: DC
  Administered 2015-12-28 – 2015-12-29 (×3): 7 mL via OROMUCOSAL

## 2015-12-28 MED ORDER — DIATRIZOATE MEGLUMINE & SODIUM 66-10 % PO SOLN
ORAL | Status: AC
  Administered 2015-12-28: 90 mL via NASOGASTRIC
  Filled 2015-12-28: qty 90

## 2015-12-28 MED ORDER — DIATRIZOATE MEGLUMINE & SODIUM 66-10 % PO SOLN
90.0000 mL | Freq: Once | ORAL | Status: AC
  Administered 2015-12-28: 90 mL via NASOGASTRIC
  Filled 2015-12-28: qty 90

## 2015-12-28 NOTE — Care Management Important Message (Signed)
Important Message  Patient Details  Name: Travis Nguyen MRN: [id number] Date of Birth: 01-May-1951   Medicare Important Message Given:  Yes    Loann Quill 12/28/2015, 10:03 AM

## 2015-12-28 NOTE — Progress Notes (Signed)
PROGRESS NOTE  Travis Nguyen  [id number] DOB: August 03, 1950 DOA: 12/27/2015 PCP: [name], MD  Brief Narrative:   Travis Nguyen is a 65 y.o. male with medical history significant of SBO in past following resection of prostate cancer, 2015 resolved medically. MM completed chemo. Patient presents to the ED with c/o abdominal pain, which onset after he ate dinner on 6/13. He went to the ED at [facility] but left due to "not having pain controlled". ABD x ray at that time was negative. After leaving the ED he developed nausea and vomiting which persisted and worsened throughout the day. He had been having hard BMs before going to the ED. No diarrhea.  Assessment & Plan:   Principal Problem:   Small bowel obstruction (HCC) Active Problems:   Prostate cancer (HCC)   S/P prostatectomy  High grade SBO likely due to adhesions from SBO, feeling better after NG placement. Absent bowel sounds on my exam today. -  Continue NG to LIS -  Repeat KUB in AM -  Change to IVF with dextrose to prevent hypoglycemia -  Encouraged ambulation -  General surgery consultation  DVT prophylaxis:  lovenox Code Status:  full Family Communication:  Patient alone Disposition Plan:  Pending passing flatus, BMs and tolerating diet   Consultants:   General surgery   Procedures:  NG placement CT ab/p  Antimicrobials:   none    Subjective: Continues to have some point tenderness just above the umbilicus despite NG to suction.  Feels a little distended in upper abdomen.  Nausea is minimal.  Hiccoughing.  Last BM 2pm 6/14  Objective: Filed Vitals:   12/27/15 2107 12/27/15 2335 12/28/15 0453 12/28/15 1346  BP: 135/84 139/77 137/84 125/85  Pulse: 66 64 66 60  Temp:  98.6 F (37 C) 98.7 F (37.1 C) 98.7 F (37.1 C)  TempSrc:  Oral Oral Oral  Resp:  19 18 18   Height:  6\' 1"  (1.854 m)    Weight:  68.448 kg (150 lb 14.4 oz)    SpO2: 99% 100% 100% 100%    Intake/Output Summary (Last  24 hours) at 12/28/15 1744 Last data filed at 12/28/15 1558  Gross per 24 hour  Intake 797.92 ml  Output   2850 ml  Net -2052.08 ml   Filed Weights   12/27/15 2335  Weight: 68.448 kg (150 lb 14.4 oz)    Examination:  General exam:  Adult male.  No acute distress.   HEENT:  NCAT, MMM, NG to LIS Respiratory system: Clear to auscultation bilaterally Cardiovascular system: Regular rate and rhythm, normal S1/S2. No murmurs, rubs, gallops or clicks.  Warm extremities Gastrointestinal system:  Absent bowel sounds, soft, mildly distended in left upper quadrant and tender just above the umbilicus but without rebound or guarding. MSK:  Normal tone and bulk, no lower extremity edema Neuro:  Grossly intact    Data Reviewed: I have personally reviewed following labs and imaging studies  CBC:  Recent Labs Lab 12/27/15 0220 12/27/15 1541  WBC 8.4 6.7  HGB 14.6 14.7  HCT 42.5 43.6  MCV 93.4 93.4  PLT 167 [id number]   Basic Metabolic Panel:  Recent Labs Lab 12/27/15 0220 12/27/15 1541  NA 136 135  K 4.7 4.6  CL 102 100*  CO2 26 25  GLUCOSE 127* 143*  BUN 16 15  CREATININE 1.03 0.99  CALCIUM 9.8 10.2   GFR: Estimated Creatinine Clearance: 74.8 mL/min (by C-G formula based on Cr of 0.99). Liver Function  Tests:  Recent Labs Lab 12/27/15 0220 12/27/15 1541  AST 34 38  ALT 23 30  ALKPHOS 58 59  BILITOT 0.9 0.6  PROT 8.1 8.1  ALBUMIN 4.7 4.6    Recent Labs Lab 12/27/15 0220 12/27/15 1541  LIPASE 28 25   No results for input(s): AMMONIA in the last 168 hours. Coagulation Profile: No results for input(s): INR, PROTIME in the last 168 hours. Cardiac Enzymes: No results for input(s): CKTOTAL, CKMB, CKMBINDEX, TROPONINI in the last 168 hours. BNP (last 3 results) No results for input(s): PROBNP in the last 8760 hours. HbA1C: No results for input(s): HGBA1C in the last 72 hours. CBG: No results for input(s): GLUCAP in the last 168 hours. Lipid Profile: No results  for input(s): CHOL, HDL, LDLCALC, TRIG, CHOLHDL, LDLDIRECT in the last 72 hours. Thyroid Function Tests: No results for input(s): TSH, T4TOTAL, FREET4, T3FREE, THYROIDAB in the last 72 hours. Anemia Panel: No results for input(s): VITAMINB12, FOLATE, FERRITIN, TIBC, IRON, RETICCTPCT in the last 72 hours. Urine analysis:    Component Value Date/Time   COLORURINE YELLOW 12/27/2015 [place] 12/27/2015 1554   LABSPEC 1.031* 12/27/2015 1554   PHURINE 5.5 12/27/2015 1554   GLUCOSEU NEGATIVE 12/27/2015 1554   HGBUR NEGATIVE 12/27/2015 1554   BILIRUBINUR NEGATIVE 12/27/2015 1554   KETONESUR NEGATIVE 12/27/2015 1554   PROTEINUR NEGATIVE 12/27/2015 1554   UROBILINOGEN 0.2 03/18/2014 0149   NITRITE NEGATIVE 12/27/2015 1554   LEUKOCYTESUR NEGATIVE 12/27/2015 1554   Sepsis Labs: @LABRCNTIP (procalcitonin:4,lacticidven:4)  )No results found for this or any previous visit (from the past 240 hour(s)).    Radiology Studies: Ct Abdomen Pelvis W Contrast  12/27/2015  CLINICAL DATA:  Reports mid lower abd pain since last night. Seen at [facility] this morning. Reports pain is worse with nausea and 1 episode of vomiting. Denies diarrhea. EXAM: CT ABDOMEN AND PELVIS WITH CONTRAST TECHNIQUE: Multidetector CT imaging of the abdomen and pelvis was performed using the standard protocol following bolus administration of intravenous contrast. CONTRAST:  1 ISOVUE-300 IOPAMIDOL (ISOVUE-300) INJECTION 61% COMPARISON:  05/29/2014 FINDINGS: Lower chest: Port catheter tip at the cavoatrial junction. Visualized lung bases clear. Hepatobiliary: Stable segment 7 hemangioma. Cholecystectomy clips. No new lesion. Pancreas: No mass, inflammatory changes, or other significant abnormality. Spleen: Within normal limits in size and appearance. Adrenals/Urinary Tract: Normal adrenal glands. Stable small bilateral renal cysts. No hydronephrosis or ureterectasis. Urinary bladder incompletely distended. Stomach/Bowel: Marked  gastric distention. Duodenum and proximal small bowel is decompressed. Multiple dilated distal small bowel loops, transition point just cephalad to the symphysis pubis to the left of midline. Study loops do not have particularly thickened bowel wall. No significant regional mesenteric edema. Distal most loops of bowel including terminal ileum are decompressed. The colon is nondistended. Vascular/Lymphatic: No pathologically enlarged lymph nodes. No evidence of abdominal aortic aneurysm. Reproductive: No mass or other significant abnormality. Other: Trace amount of anterior abdominal ascites in the region of the dilated small bowel loops. No free air Musculoskeletal: Advanced degenerative disc disease L5-S1. Bilateral hip osteoarthritis. IMPRESSION: 1. High-grade mid/distal small bowel obstruction, transition point in the left anterior pelvis, similar to what was seen on prior scan of 05/29/2014, suggesting adhesions in this region. 2. Small amount of mesenteric ascites. No other stigmata of bowel ischemia. Electronically Signed   By: Lucrezia Europe M.D.   On: 12/27/2015 21:17   Dg Abd Acute W/chest  12/27/2015  CLINICAL DATA:  Abdominal pain after dinner last night. Low mid abdomen pain. No nausea  or vomiting. EXAM: DG ABDOMEN ACUTE W/ 1V CHEST COMPARISON:  Chest 08/04/2014 FINDINGS: Power port type central venous catheter on the right with tip over the cavoatrial junction. Normal heart size and pulmonary vascularity. No focal airspace disease or consolidation in the lungs. No blunting of costophrenic angles. No pneumothorax. Mediastinal contours appear intact. Scattered gas and stool in the colon. No small or large bowel distention. No free intra-abdominal air. No abnormal air-fluid levels. No radiopaque stones. Degenerative changes in the hips. IMPRESSION: No evidence of active pulmonary disease. Normal nonobstructive bowel gas pattern. Electronically Signed   By: Lucienne Capers M.D.   On: 12/27/2015 04:13      Scheduled Meds: . antiseptic oral rinse  7 mL Mouth Rinse BID  . diatrizoate meglumine-sodium  90 mL Per NG tube Once  . diatrizoate meglumine-sodium      . enoxaparin (LOVENOX) injection  40 mg Subcutaneous Daily   Continuous Infusions: . sodium chloride 125 mL/hr at 12/28/15 1558     LOS: 1 day    Time spent: 30 min    Janece Canterbury, MD [facility] Pager [phone]  If 7PM-7AM, please contact night-coverage [email or web address] Password [facility] 12/28/2015, 5:44 PM
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
## 2015-12-29 ENCOUNTER — Inpatient Hospital Stay (HOSPITAL_COMMUNITY): Payer: Medicare Other

## 2015-12-29 DIAGNOSIS — I10 Essential (primary) hypertension: Secondary | ICD-10-CM

## 2015-12-29 DIAGNOSIS — K5660 Unspecified intestinal obstruction: Secondary | ICD-10-CM

## 2015-12-29 NOTE — Discharge Summary (Addendum)
AVS reviewed and given to patient who verbalizes understanding. Will dc ambulatory to private car home with all personal belongings accompanied by wife at 1510.

## 2015-12-29 NOTE — Care Management Important Message (Signed)
Important Message  Patient Details  Name: Travis Nguyen MRN: [id number] Date of Birth: 1950-10-03   Medicare Important Message Given:  Yes    Loann Quill 12/29/2015, 9:36 AM

## 2015-12-29 NOTE — Discharge Summary (Addendum)
Physician Discharge Summary  KINCADE ALLTON [id number] DOB: 12/17/1950 DOA: 12/27/2015  PCP: [name], MD  Admit date: 12/27/2015 Discharge date: 12/29/2015  Recommendations for Outpatient Follow-up:  1. Follow up with general surgery and PCP as needed  Discharge Diagnoses:  Principal Problem:   Small bowel obstruction ([place]) Active Problems:   Prostate cancer ([place])   S/P prostatectomy   Discharge Condition: stable, improved  Diet recommendation:  Full liquids to advance gradually to a soft diet, discussed with patient   Wt Readings from Last 3 Encounters:  12/27/15 68.448 kg (150 lb 14.4 oz)  09/19/15 73.982 kg (163 lb 1.6 oz)  03/21/15 71.079 kg (156 lb 11.2 oz)    History of present illness:   Travis Nguyen is a 65 y.o. male with medical history significant of SBO in past following resection of prostate cancer, 2015 resolved medically. MM completed chemo. Patient presents to the ED with c/o abdominal pain, which onset after he ate dinner on 6/13. He went to the ED at [facility] but left due to "not having pain controlled". ABD x ray at that time was negative. After leaving the ED he developed nausea and vomiting which persisted and worsened throughout the day. He had been having hard BMs before going to the ED. No diarrhea.  Hospital Course:   High grade SBO likely due to adhesions from SBO.  He had NG tube placed with immediate relief of his abdominal pain.  He continued to pass flatus for the duration of his illness.  He developed watery stools prior to discharge and was able to tolerate a full liquid diet.  Abdominal XR confirmed contrast in the colon and improvement in his bowel gas patterns.  He was seen by general surgery but his SBO resolved without surgical intervention. He was advised to gradually advance from a full liquid diet to a low fiber diet until he is back to normal.    Hypertension, blood pressure elevated while holding his ARB.  ARB resumed at  discharge.    Consultants:   General surgery  Procedures:  NG placement CT ab/p  Antimicrobials:   none  Discharge Exam: Filed Vitals:   12/29/15 0604 12/29/15 1315  BP: 142/69 165/86  Pulse: 60 62  Temp: 99 F (37.2 C) 98.1 F (36.7 C)  Resp: 19 19   Filed Vitals:   12/28/15 1346 12/28/15 2100 12/29/15 0604 12/29/15 1315  BP: 125/85 113/72 142/69 165/86  Pulse: 60 60 60 62  Temp: 98.7 F (37.1 C) 98.6 F (37 C) 99 F (37.2 C) 98.1 F (36.7 C)  TempSrc: Oral Oral Oral Oral  Resp: 18 19 19 19   Height:      Weight:      SpO2: 100% 100% 100% 100%    General exam: Adult male. No acute distress.  HEENT: NCAT, MMM Respiratory system: Clear to auscultation bilaterally Cardiovascular system: Regular rate and rhythm, normal S1/S2. No murmurs, rubs, gallops or clicks. Warm extremities Gastrointestinal system:  NABS, soft, nondistended and nontender. MSK: Normal tone and bulk, no lower extremity edema Neuro: Grossly intact  Discharge Instructions      Discharge Instructions    Call MD for:  difficulty breathing, headache or visual disturbances    Complete by:  As directed      Call MD for:  extreme fatigue    Complete by:  As directed      Call MD for:  hives    Complete by:  As directed  Call MD for:  persistant dizziness or light-headedness    Complete by:  As directed      Call MD for:  persistant nausea and vomiting    Complete by:  As directed      Call MD for:  severe uncontrolled pain    Complete by:  As directed      Call MD for:  temperature >100.4    Complete by:  As directed      Discharge instructions    Complete by:  As directed   Please continue a mostly liquid diet and gradually add in bland low fiber foods as tolerated.  If you have worsening abdominal pain, please change to a clear liquid diet.  If your pain continues to worsen and/or you become bloated and have vomiting, please return to the hospital.     Increase activity  slowly    Complete by:  As directed             Medication List    STOP taking these medications        lidocaine-prilocaine cream  Commonly known as:  EMLA      TAKE these medications        acetaminophen 500 MG tablet  Commonly known as:  TYLENOL  Take 500-1,000 mg by mouth every 6 (six) hours as needed (For pain.). Reported on 09/19/2015     losartan 100 MG tablet  Commonly known as:  COZAAR  Take 100 mg by mouth every morning.     multivitamin with minerals Tabs tablet  Take 1 tablet by mouth daily.       Follow-up Information    Schedule an appointment as soon as possible for a visit with [name], MD.   Specialty:  Family Medicine   Why:  As needed   Contact information:   [place] [address] [phone]       Follow up with [place].   Specialty:  General Surgery   Why:  As needed, If symptoms worsen   Contact information:   [place] [address] [phone]        The results of significant diagnostics from this hospitalization (including imaging, microbiology, ancillary and laboratory) are listed below for reference.    Significant Diagnostic Studies: Ct Abdomen Pelvis W Contrast  12/27/2015  CLINICAL DATA:  Reports mid lower abd pain since last night. Seen at [facility] this morning. Reports pain is worse with nausea and 1 episode of vomiting. Denies diarrhea. EXAM: CT ABDOMEN AND PELVIS WITH CONTRAST TECHNIQUE: Multidetector CT imaging of the abdomen and pelvis was performed using the standard protocol following bolus administration of intravenous contrast. CONTRAST:  1 ISOVUE-300 IOPAMIDOL (ISOVUE-300) INJECTION 61% COMPARISON:  05/29/2014 FINDINGS: Lower chest: Port catheter tip at the cavoatrial junction. Visualized lung bases clear. Hepatobiliary: Stable segment 7 hemangioma. Cholecystectomy clips. No new lesion. Pancreas: No mass, inflammatory changes, or other significant  abnormality. Spleen: Within normal limits in size and appearance. Adrenals/Urinary Tract: Normal adrenal glands. Stable small bilateral renal cysts. No hydronephrosis or ureterectasis. Urinary bladder incompletely distended. Stomach/Bowel: Marked gastric distention. Duodenum and proximal small bowel is decompressed. Multiple dilated distal small bowel loops, transition point just cephalad to the symphysis pubis to the left of midline. Study loops do not have particularly thickened bowel wall. No significant regional mesenteric edema. Distal most loops of bowel including terminal ileum are decompressed. The colon is nondistended. Vascular/Lymphatic: No pathologically enlarged  lymph nodes. No evidence of abdominal aortic aneurysm. Reproductive: No mass or other significant abnormality. Other: Trace amount of anterior abdominal ascites in the region of the dilated small bowel loops. No free air Musculoskeletal: Advanced degenerative disc disease L5-S1. Bilateral hip osteoarthritis. IMPRESSION: 1. High-grade mid/distal small bowel obstruction, transition point in the left anterior pelvis, similar to what was seen on prior scan of 05/29/2014, suggesting adhesions in this region. 2. Small amount of mesenteric ascites. No other stigmata of bowel ischemia. Electronically Signed   By: Lucrezia Europe M.D.   On: 12/27/2015 21:17   Dg Abd Acute W/chest  12/27/2015  CLINICAL DATA:  Abdominal pain after dinner last night. Low mid abdomen pain. No nausea or vomiting. EXAM: DG ABDOMEN ACUTE W/ 1V CHEST COMPARISON:  Chest 08/04/2014 FINDINGS: Power port type central venous catheter on the right with tip over the cavoatrial junction. Normal heart size and pulmonary vascularity. No focal airspace disease or consolidation in the lungs. No blunting of costophrenic angles. No pneumothorax. Mediastinal contours appear intact. Scattered gas and stool in the colon. No small or large bowel distention. No free intra-abdominal air. No abnormal  air-fluid levels. No radiopaque stones. Degenerative changes in the hips. IMPRESSION: No evidence of active pulmonary disease. Normal nonobstructive bowel gas pattern. Electronically Signed   By: Lucienne Capers M.D.   On: 12/27/2015 04:13   Dg Abd Portable 1v-small Bowel Obstruction Protocol-initial, 8 Hr Delay  12/29/2015  CLINICAL DATA:  Small bowel obstruction protocol, 8 hour delay. Gastrografin given by RN at 1930 hours. EXAM: PORTABLE ABDOMEN - 1 VIEW COMPARISON:  CT abdomen and pelvis 12/27/2015.  Abdomen 12/27/2015. FINDINGS: Contrast material is demonstrated throughout the colon. Gaseous distention of some mid abdominal small bowel. Appearance suggests partial small bowel obstruction. An enteric tube is present in the left upper quadrant consistent with location in the stomach. Degenerative changes in the hips. IMPRESSION: Contrast material is present in the colon. Gaseous distention of mid abdominal small bowel. Changes suggest partial small bowel obstruction. Electronically Signed   By: Lucienne Capers M.D.   On: 12/29/2015 03:11    Microbiology: No results found for this or any previous visit (from the past 240 hour(s)).   Labs: Basic Metabolic Panel:  Recent Labs Lab 12/27/15 0220 12/27/15 1541  NA 136 135  K 4.7 4.6  CL 102 100*  CO2 26 25  GLUCOSE 127* 143*  BUN 16 15  CREATININE 1.03 0.99  CALCIUM 9.8 10.2   Liver Function Tests:  Recent Labs Lab 12/27/15 0220 12/27/15 1541  AST 34 38  ALT 23 30  ALKPHOS 58 59  BILITOT 0.9 0.6  PROT 8.1 8.1  ALBUMIN 4.7 4.6    Recent Labs Lab 12/27/15 0220 12/27/15 1541  LIPASE 28 25   No results for input(s): AMMONIA in the last 168 hours. CBC:  Recent Labs Lab 12/27/15 0220 12/27/15 1541  WBC 8.4 6.7  HGB 14.6 14.7  HCT 42.5 43.6  MCV 93.4 93.4  PLT 167 161   Cardiac Enzymes: No results for input(s): CKTOTAL, CKMB, CKMBINDEX, TROPONINI in the last 168 hours. BNP: BNP (last 3 results) No results for  input(s): BNP in the last 8760 hours.  ProBNP (last 3 results) No results for input(s): PROBNP in the last 8760 hours.  CBG: No results for input(s): GLUCAP in the last 168 hours.  Time coordinating discharge: 20 minutes  Signed:  Renie Stelmach  [facility] 12/29/2015, 2:09 PM

## 2015-12-29 NOTE — Progress Notes (Signed)
Subjective: Patient continues to pass gas. No bowel movements this morning or through the night. Denies nausea and vomiting.   Objective: Vital signs in last 24 hours: Temp:  [98.6 F (37 C)-99 F (37.2 C)] 99 F (37.2 C) (06/16 0604) Pulse Rate:  [60] 60 (06/16 0604) Resp:  [18-19] 19 (06/16 0604) BP: (113-142)/(69-85) 142/69 mmHg (06/16 0604) SpO2:  [100 %] 100 % (06/16 0604) Last BM Date: 12/27/15  Intake/Output from previous day: 06/15 0701 - 06/16 0700 In: 1288 [I.V.:1288] Out: 2450 [Urine:1750; Emesis/NG output:700] Intake/Output this shift:   Physical Exam: Gen appearance: Talkative and relaxed Abdomen: Soft and mildly distended. No tenderness to light or deep palpation. Bowel sounds positive in all four quadrants.   Lab Results:   Recent Labs  12/27/15 0220 12/27/15 1541  WBC 8.4 6.7  HGB 14.6 14.7  HCT 42.5 43.6  PLT 167 161   BMET  Recent Labs  12/27/15 0220 12/27/15 1541  NA 136 135  K 4.7 4.6  CL 102 100*  CO2 26 25  GLUCOSE 127* 143*  BUN 16 15  CREATININE 1.03 0.99  CALCIUM 9.8 10.2   PT/INR No results for input(s): LABPROT, INR in the last 72 hours. ABG No results for input(s): PHART, HCO3 in the last 72 hours.  Invalid input(s): PCO2, PO2  Studies/Results: Ct Abdomen Pelvis W Contrast  12/27/2015  CLINICAL DATA:  Reports mid lower abd pain since last night. Seen at [facility] this morning. Reports pain is worse with nausea and 1 episode of vomiting. Denies diarrhea. EXAM: CT ABDOMEN AND PELVIS WITH CONTRAST TECHNIQUE: Multidetector CT imaging of the abdomen and pelvis was performed using the standard protocol following bolus administration of intravenous contrast. CONTRAST:  1 ISOVUE-300 IOPAMIDOL (ISOVUE-300) INJECTION 61% COMPARISON:  05/29/2014 FINDINGS: Lower chest: Port catheter tip at the cavoatrial junction. Visualized lung bases clear. Hepatobiliary: Stable segment 7 hemangioma. Cholecystectomy clips. No new lesion. Pancreas: No mass,  inflammatory changes, or other significant abnormality. Spleen: Within normal limits in size and appearance. Adrenals/Urinary Tract: Normal adrenal glands. Stable small bilateral renal cysts. No hydronephrosis or ureterectasis. Urinary bladder incompletely distended. Stomach/Bowel: Marked gastric distention. Duodenum and proximal small bowel is decompressed. Multiple dilated distal small bowel loops, transition point just cephalad to the symphysis pubis to the left of midline. Study loops do not have particularly thickened bowel wall. No significant regional mesenteric edema. Distal most loops of bowel including terminal ileum are decompressed. The colon is nondistended. Vascular/Lymphatic: No pathologically enlarged lymph nodes. No evidence of abdominal aortic aneurysm. Reproductive: No mass or other significant abnormality. Other: Trace amount of anterior abdominal ascites in the region of the dilated small bowel loops. No free air Musculoskeletal: Advanced degenerative disc disease L5-S1. Bilateral hip osteoarthritis. IMPRESSION: 1. High-grade mid/distal small bowel obstruction, transition point in the left anterior pelvis, similar to what was seen on prior scan of 05/29/2014, suggesting adhesions in this region. 2. Small amount of mesenteric ascites. No other stigmata of bowel ischemia. Electronically Signed   By: Lucrezia Europe M.D.   On: 12/27/2015 21:17   Dg Abd Portable 1v-small Bowel Obstruction Protocol-initial, 8 Hr Delay  12/29/2015  CLINICAL DATA:  Small bowel obstruction protocol, 8 hour delay. Gastrografin given by RN at 1930 hours. EXAM: PORTABLE ABDOMEN - 1 VIEW COMPARISON:  CT abdomen and pelvis 12/27/2015.  Abdomen 12/27/2015. FINDINGS: Contrast material is demonstrated throughout the colon. Gaseous distention of some mid abdominal small bowel. Appearance suggests partial small bowel obstruction. An enteric tube is present in  the left upper quadrant consistent with location in the stomach.  Degenerative changes in the hips. IMPRESSION: Contrast material is present in the colon. Gaseous distention of mid abdominal small bowel. Changes suggest partial small bowel obstruction. Electronically Signed   By: Lucienne Capers M.D.   On: 12/29/2015 03:11    Anti-infectives: Anti-infectives    None      Assessment/Plan: Small Bowel Obstruction  Clinically and radiologically resolved. Contrast noted in the colon on xray. NG tube output (700 ml). D/C NG today If patient tolerates 800 cc of fluid, home today. All questions were answered  Lannie Fields PA-S 12/29/2015

## 2016-01-08 ENCOUNTER — Other Ambulatory Visit: Payer: Self-pay | Admitting: Oncology

## 2016-01-08 MED FILL — LOSARTAN POTASSIUM 100 MG T: 100 | 30 days supply | Qty: 30 | Fill #2

## 2016-01-08 MED FILL — LIDOCAINE-PRILOCAINE CREAM: 2.5-2.5 | 20 days supply | Qty: 30 | Fill #0

## 2016-01-09 ENCOUNTER — Ambulatory Visit (HOSPITAL_BASED_OUTPATIENT_CLINIC_OR_DEPARTMENT_OTHER): Payer: Medicare Other

## 2016-01-09 VITALS — BP 160/81 | HR 63 | Temp 98.5°F | Resp 19

## 2016-01-09 DIAGNOSIS — Z452 Encounter for adjustment and management of vascular access device: Secondary | ICD-10-CM | POA: Diagnosis not present

## 2016-01-09 DIAGNOSIS — C9001 Multiple myeloma in remission: Secondary | ICD-10-CM

## 2016-01-09 DIAGNOSIS — C61 Malignant neoplasm of prostate: Secondary | ICD-10-CM

## 2016-01-09 MED ORDER — SODIUM CHLORIDE 0.9 % IJ SOLN
10.0000 mL | INTRAMUSCULAR | Status: DC | PRN
  Administered 2016-01-09: 10 mL via INTRAVENOUS
  Filled 2016-01-09: qty 10

## 2016-01-09 MED ORDER — HEPARIN SOD (PORK) LOCK FLUSH 100 UNIT/ML IV SOLN
500.0000 [IU] | Freq: Once | INTRAVENOUS | Status: AC | PRN
  Administered 2016-01-09: 500 [IU] via INTRAVENOUS
  Filled 2016-01-09: qty 5

## 2016-01-09 NOTE — Patient Instructions (Signed)

## 2016-01-16 IMAGING — CT CT L SPINE W/ CM
3 of 8 series · 11 of 33 positions shown, 13 images · non-contrast
Comparison: MRI 03/17/2015

CLINICAL DATA: Chronic low back pain worse on the right. Pain
extends into the right leg. Spondylosis without myelopathy.
TECHNIQUE: Contiguous axial images were obtained through the Lumbar spine after
the intrathecal infusion of infusion. Coronal and sagittal
reconstructions were obtained of the axial image sets.

[Series 2: l spine soft · axial · 0.29mm/px · z∈[+980,+1109]mm · 3 of 76 slices shown, 4 images]
[im 22/76  soft-tissue]
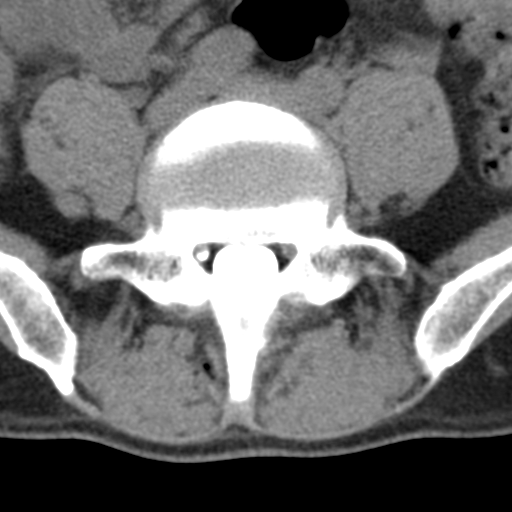
[im 22/76  bone]
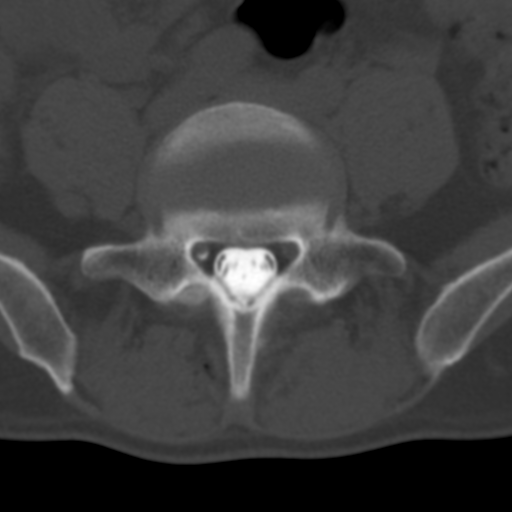
[im 43/76  bone]
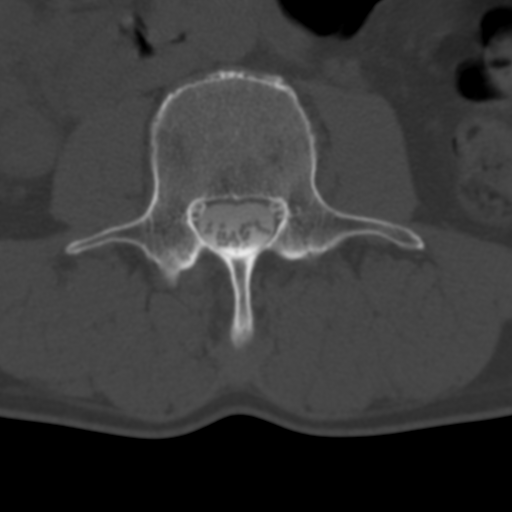
[im 65/76  bone]
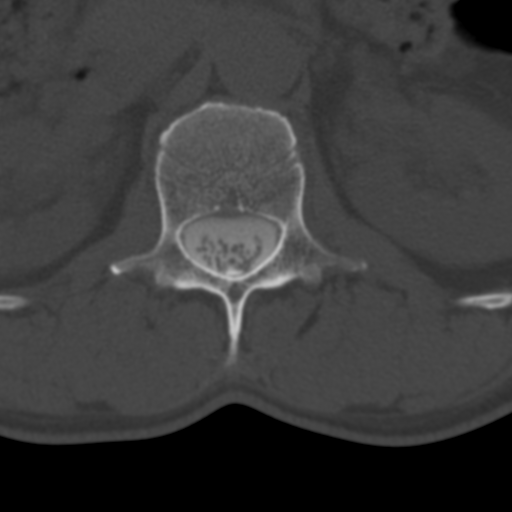

[Series 6: bone cor · coronal · 0.30mm/px · 3 of 40 slices shown]
[im 8/40  bone]
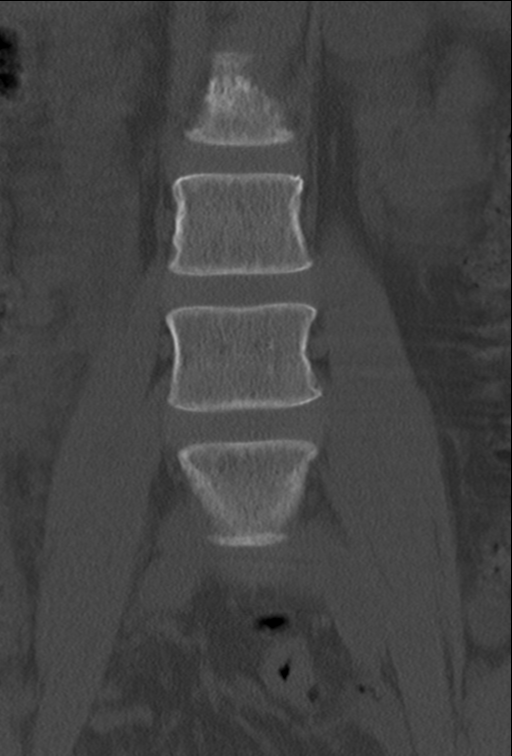
[im 16/40  bone]
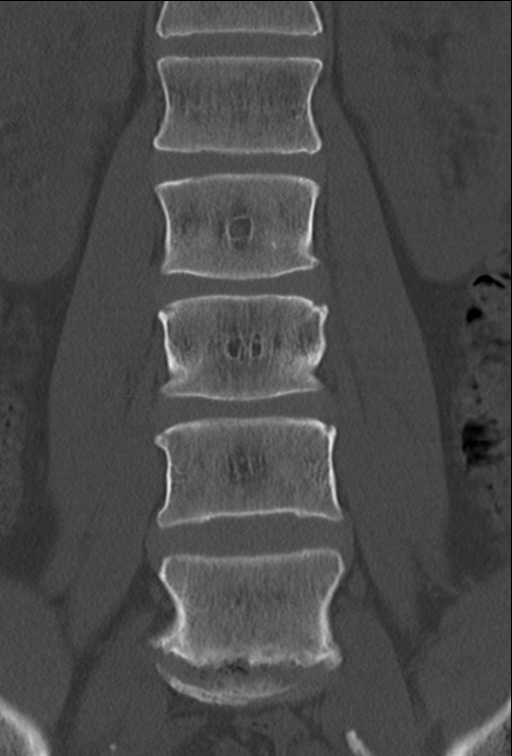
[im 24/40  bone]
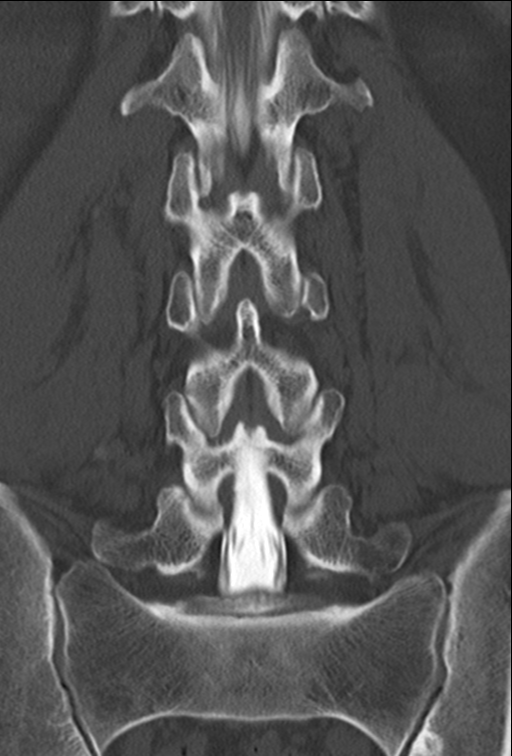

[Series 7: sag bone · sagittal · 0.28mm/px · 5 of 40 slices shown, 6 images]
[im 14/40  bone]
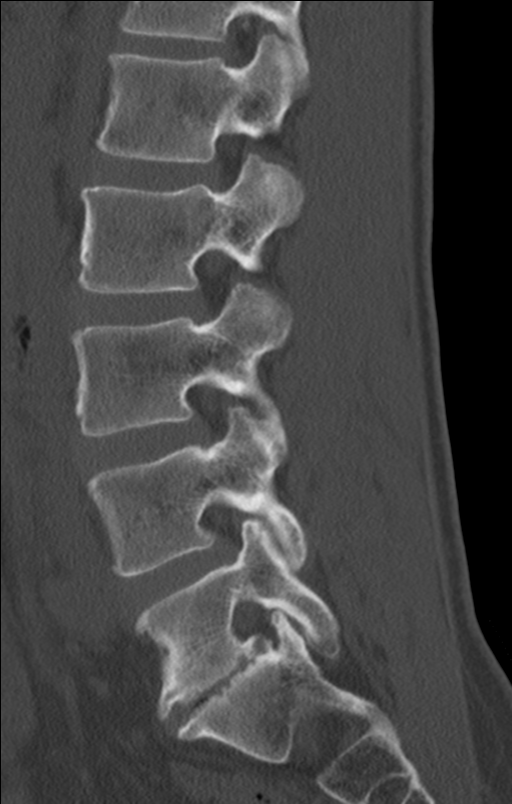
[im 17/40  bone]
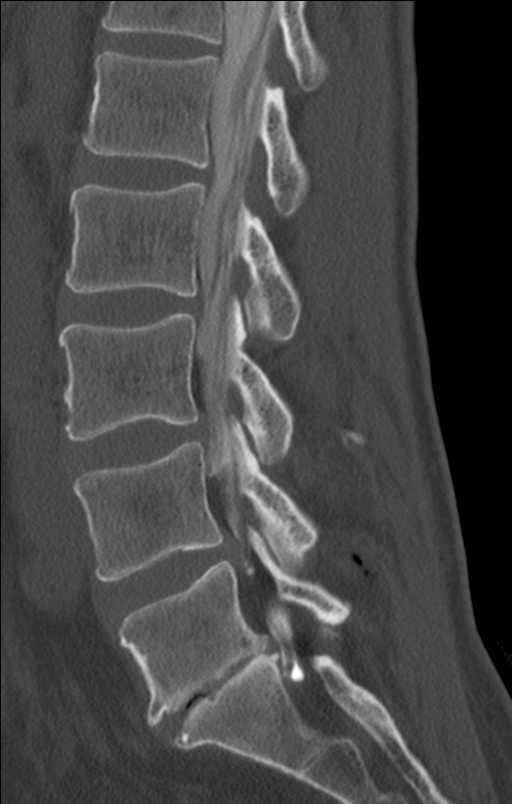
[im 20/40  soft-tissue]
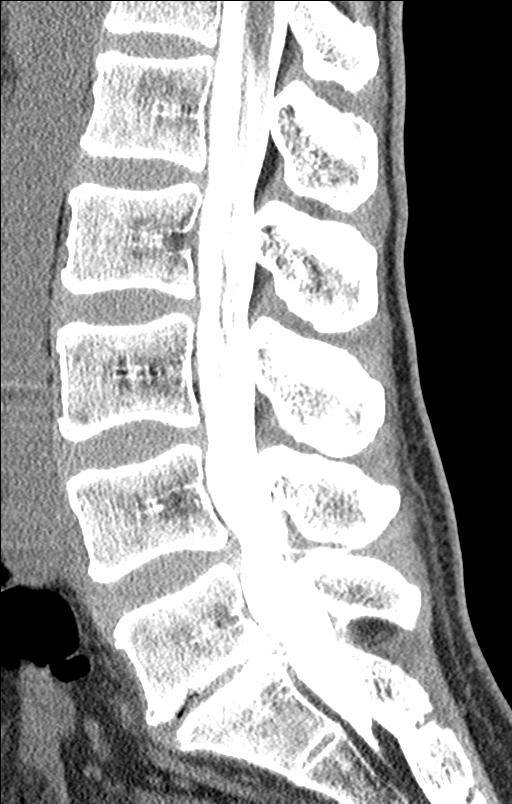
[im 20/40  bone]
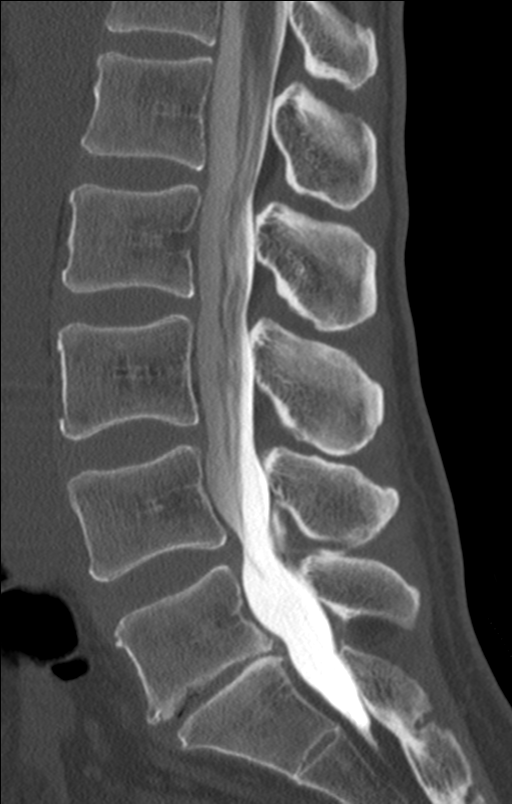
[im 23/40  bone]
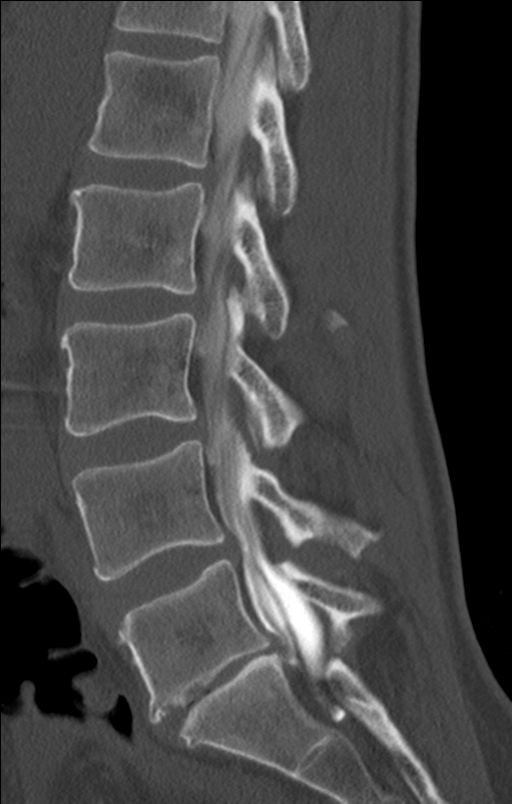
[im 27/40  bone]
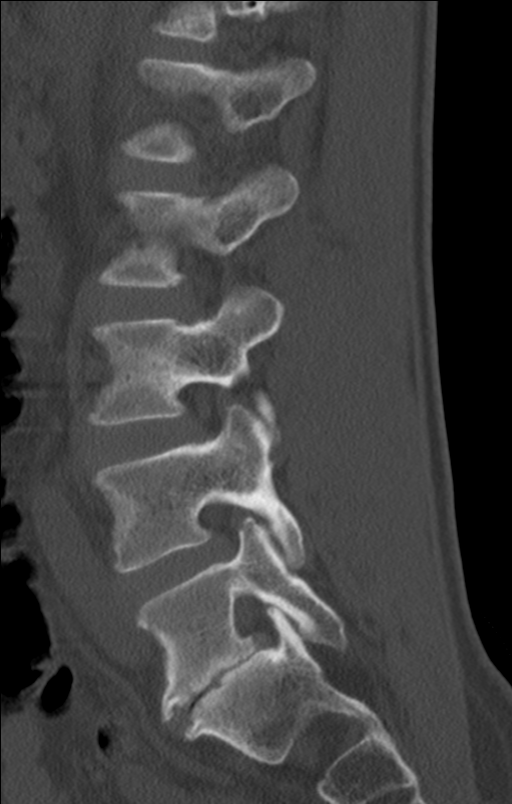

[11 of 33 positions shown; findings below may reference images not displayed]

EXAM:
LUMBAR MYELOGRAM

FLUOROSCOPY TIME:  0 minutes 43 seconds.  Eighty images.

PROCEDURE:
After thorough discussion of risks and benefits of the procedure
including bleeding, infection, injury to nerves, blood vessels,
adjacent structures as well as headache and CSF leak, written and
oral informed consent was obtained. Consent was obtained by Dr. Omerta
Polin. Time out form was completed.

Patient was positioned prone on the fluoroscopy table. Local
anesthesia was provided with 1% lidocaine without epinephrine after
prepped and draped in the usual sterile fashion. Puncture was
performed at L4-5 using a 3 1/2 inch 22-gauge spinal needle via
right para median approach. Using a single pass through the dura,
the needle was placed within the thecal sac, with return of clear
CSF. 15 mL of 3mnipaque-V2L was injected into the thecal sac, with
normal opacification of the nerve roots and cauda equina consistent
with free flow within the subarachnoid space.

I personally performed the lumbar puncture and administered the
intrathecal contrast. I also personally performed acquisition of the
myelogram images.
FINDINGS: LUMBAR MYELOGRAM FINDINGS:

No abnormality at L3-4 or above. Anterior extradural defect at L4-5.
Mild stenosis of both lateral recesses. No abnormal motion.

Chronic disc space narrowing at L5-S1. No central canal stenosis.
Asymmetric diminished filling of the left S1 root sleeve. See below.

CT LUMBAR MYELOGRAM FINDINGS:

There is no abnormality at L3-4 or above. The discs are normal. The
canal and foramina are widely patent. The distal cord and conus are
normal with the conus tip at L1.

L4-5: Disc degeneration with circumferential bulging of the disc.
Mild facet and ligamentous hypertrophy. Mild stenosis of both
lateral recesses that could possibly be symptomatic. L4 nerve roots
exit the foramina without compression.

L5-S1: Chronic disc degeneration with loss of disc height. Endplate
osteophytes and mild bulging of the disc. No compressive stenosis of
the canal or lateral recesses. Right S1 root sleeve fills more
distally than the left, but there is no compressive pathology. Mild
facet degeneration bilaterally. No compressive foraminal stenosis.
IMPRESSION: L4-5: Circumferential bulging of the disc. Mild facet hypertrophy.
Mild stenosis of both lateral recesses that would have potential to
affect either or both L5 nerve roots.

L5-S1: Chronic disc degeneration with loss of disc height. Endplate
osteophytes and mild bulging of the disc. No compressive canal or
foraminal stenosis. Asymmetric diminished filling of the left S1
root sleeve seen an myelography is not due to nerve compression, but
ordinary insignificant asymmetry.

## 2016-02-16 MED FILL — LOSARTAN POTASSIUM 100 MG T: 100 | 30 days supply | Qty: 30 | Fill #3

## 2016-03-05 ENCOUNTER — Ambulatory Visit (HOSPITAL_BASED_OUTPATIENT_CLINIC_OR_DEPARTMENT_OTHER): Payer: Medicare Other

## 2016-03-05 DIAGNOSIS — C61 Malignant neoplasm of prostate: Secondary | ICD-10-CM

## 2016-03-05 DIAGNOSIS — Z452 Encounter for adjustment and management of vascular access device: Secondary | ICD-10-CM

## 2016-03-05 DIAGNOSIS — C9001 Multiple myeloma in remission: Secondary | ICD-10-CM | POA: Diagnosis not present

## 2016-03-05 MED ORDER — HEPARIN SOD (PORK) LOCK FLUSH 100 UNIT/ML IV SOLN
500.0000 [IU] | Freq: Once | INTRAVENOUS | Status: AC | PRN
  Administered 2016-03-05: 500 [IU] via INTRAVENOUS
  Filled 2016-03-05: qty 5

## 2016-03-05 MED ORDER — SODIUM CHLORIDE 0.9 % IJ SOLN
10.0000 mL | INTRAMUSCULAR | Status: DC | PRN
  Administered 2016-03-05: 10 mL via INTRAVENOUS
  Filled 2016-03-05: qty 10

## 2016-03-13 MED FILL — OXYCODONE-APAP 10-325: 10-325 | 30 days supply | Qty: 60 | Fill #0

## 2016-03-19 MED FILL — LOSARTAN POTASSIUM 100 MG T: 100 | 90 days supply | Qty: 90 | Fill #0

## 2016-03-21 ENCOUNTER — Ambulatory Visit (HOSPITAL_BASED_OUTPATIENT_CLINIC_OR_DEPARTMENT_OTHER): Payer: Medicare Other

## 2016-03-21 ENCOUNTER — Other Ambulatory Visit (HOSPITAL_BASED_OUTPATIENT_CLINIC_OR_DEPARTMENT_OTHER): Payer: Medicare Other

## 2016-03-21 DIAGNOSIS — C9001 Multiple myeloma in remission: Secondary | ICD-10-CM

## 2016-03-21 DIAGNOSIS — C61 Malignant neoplasm of prostate: Secondary | ICD-10-CM

## 2016-03-21 LAB — CBC WITH DIFFERENTIAL/PLATELET
BASO%: 0.4 % (ref 0.0–2.0)
Basophils Absolute: 0 10*3/uL (ref 0.0–0.1)
EOS%: 1.4 % (ref 0.0–7.0)
Eosinophils Absolute: 0.1 10*3/uL (ref 0.0–0.5)
HCT: 41.7 % (ref 38.4–49.9)
HGB: 13.9 g/dL (ref 13.0–17.1)
LYMPH%: 39 % (ref 14.0–49.0)
MCH: 31.9 pg (ref 27.2–33.4)
MCHC: 33.4 g/dL (ref 32.0–36.0)
MCV: 95.4 fL (ref 79.3–98.0)
MONO#: 0.4 10*3/uL (ref 0.1–0.9)
MONO%: 10.9 % (ref 0.0–14.0)
NEUT%: 48.3 % (ref 39.0–75.0)
NEUTROS ABS: 1.8 10*3/uL (ref 1.5–6.5)
Platelets: 153 10*3/uL (ref 140–400)
RBC: 4.36 10*6/uL (ref 4.20–5.82)
RDW: 12.8 % (ref 11.0–14.6)
WBC: 3.7 10*3/uL — AB (ref 4.0–10.3)
lymph#: 1.4 10*3/uL (ref 0.9–3.3)

## 2016-03-21 LAB — COMPREHENSIVE METABOLIC PANEL
ALT: 29 U/L (ref 0–55)
ANION GAP: 10 meq/L (ref 3–11)
AST: 25 U/L (ref 5–34)
Albumin: 3.9 g/dL (ref 3.5–5.0)
Alkaline Phosphatase: 71 U/L (ref 40–150)
BUN: 13.4 mg/dL (ref 7.0–26.0)
CHLORIDE: 104 meq/L (ref 98–109)
CO2: 24 meq/L (ref 22–29)
CREATININE: 0.9 mg/dL (ref 0.7–1.3)
Calcium: 9.5 mg/dL (ref 8.4–10.4)
EGFR: >90 mL/min/{1.73_m2} (ref >90)
GLUCOSE: 104 mg/dL (ref 70–140)
Potassium: 4.9 mEq/L (ref 3.5–5.1)
SODIUM: 138 meq/L (ref 136–145)
Total Bilirubin: 0.45 mg/dL (ref 0.20–1.20)
Total Protein: 7.7 g/dL (ref 6.4–8.3)

## 2016-03-21 MED ORDER — SODIUM CHLORIDE 0.9 % IJ SOLN
10.0000 mL | INTRAMUSCULAR | Status: DC | PRN
  Administered 2016-03-21: 10 mL via INTRAVENOUS
  Filled 2016-03-21: qty 10

## 2016-03-21 MED ORDER — HEPARIN SOD (PORK) LOCK FLUSH 100 UNIT/ML IV SOLN
500.0000 [IU] | Freq: Once | INTRAVENOUS | Status: AC | PRN
  Administered 2016-03-21: 500 [IU] via INTRAVENOUS
  Filled 2016-03-21: qty 5

## 2016-03-21 NOTE — Patient Instructions (Signed)

## 2016-03-22 LAB — KAPPA/LAMBDA LIGHT CHAINS
IG KAPPA FREE LIGHT CHAIN: 15.6 mg/L (ref 3.3–19.4)
IG LAMBDA FREE LIGHT CHAIN: 15.7 mg/L (ref 5.7–26.3)
KAPPA/LAMBDA FLC RATIO: 0.99 (ref 0.26–1.65)

## 2016-03-26 LAB — MULTIPLE MYELOMA PANEL, SERUM
ALBUMIN SERPL ELPH-MCNC: 4.1 g/dL (ref 2.9–4.4)
ALBUMIN/GLOB SERPL: 1.4 (ref 0.7–1.7)
ALPHA 1: 0.1 g/dL (ref 0.0–0.4)
ALPHA2 GLOB SERPL ELPH-MCNC: 0.8 g/dL (ref 0.4–1.0)
B-Globulin SerPl Elph-Mcnc: 1 g/dL (ref 0.7–1.3)
Gamma Glob SerPl Elph-Mcnc: 1.1 g/dL (ref 0.4–1.8)
Globulin, Total: 3.1 g/dL (ref 2.2–3.9)
IGA/IMMUNOGLOBULIN A, SERUM: 165 mg/dL (ref 61–437)
IGM (IMMUNOGLOBIN M), SRM: 135 mg/dL (ref 20–172)
TOTAL PROTEIN: 7.2 g/dL (ref 6.0–8.5)

## 2016-03-28 ENCOUNTER — Telehealth: Payer: Self-pay | Admitting: Oncology

## 2016-03-28 ENCOUNTER — Ambulatory Visit (HOSPITAL_BASED_OUTPATIENT_CLINIC_OR_DEPARTMENT_OTHER): Payer: Medicare Other | Admitting: Oncology

## 2016-03-28 VITALS — BP 138/79 | HR 57 | Temp 99.0°F | Resp 18 | Ht 73.0 in | Wt 162.4 lb

## 2016-03-28 DIAGNOSIS — M549 Dorsalgia, unspecified: Secondary | ICD-10-CM

## 2016-03-28 DIAGNOSIS — Z8546 Personal history of malignant neoplasm of prostate: Secondary | ICD-10-CM | POA: Diagnosis not present

## 2016-03-28 DIAGNOSIS — G8929 Other chronic pain: Secondary | ICD-10-CM | POA: Diagnosis not present

## 2016-03-28 DIAGNOSIS — C61 Malignant neoplasm of prostate: Secondary | ICD-10-CM

## 2016-03-28 DIAGNOSIS — C9001 Multiple myeloma in remission: Secondary | ICD-10-CM

## 2016-03-28 NOTE — Telephone Encounter (Signed)
Avs report and schedule given to patient, per 03/28/16 los. °

## 2016-03-28 NOTE — Progress Notes (Signed)
Hematology and Oncology Follow Up Visit  Travis Nguyen [id number] 1951/06/03 65 y.o. 03/28/2016 8:57 AM Travis Nguyen [name], [name], MD   Principle Diagnosis: 65 year old gentleman with the following diagnoses:  1. 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 Initially at smoldering subtype of 11% plasma cell in the bone marrow. He subsequently developed active multiple myeloma in February of 2014 with a 23% plasma cell infiltration. 2. Prostate cancer diagnosed in May of 2014. He presented with an elevated PSA of 13.2 stage TI C. clinical staging. His Gleason score was 3+4 equals 7.  Prior Therapy: He was treated initially with Cytoxan, Velcade and Decadron and subsequently his regimen changed to a Velcade, Revlimid with dexamethasone. He had an excellent response with that an M spike that is no longer detected an IgG level within normal range at April 2015. He did not have any poor prognostic features on his cytogenetics.   He is status post a robotic-assisted laparoscopic radical prostatectomy and bilateral lymph node dissection on 02/09/2014. The final pathology showed prostate adenocarcinoma Gleason score 4+3 equals 7 involving both lobes. Extraprostatic extension was present. The bladder neck margin was negative. Zero out of two lymph nodes were involved.  Current therapy:  He is on observation and surveillance.  Interim History:  Travis Nguyen presents today for a followup visit. Since the last visit, he was hospitalized in June 2017 for small bowel obstruction that spontaneously resolved without surgical intervention. Since that episode, he is been feeling reasonably well. He denied any nausea, vomiting or abdominal pain. His appetite remained at baseline.  He continues to be very functional and performs activities of daily living. He does not report any neurological deficits. Does not report any peripheral neuropathy or weakness. He denied any pathological fractures or  myalgias. He denies any new urinary symptoms including hematuria or dysuria.  He did not report any fevers or chills or sweats. Does not report any hematuria or dysuria. Does not report any headaches or blurry vision or double vision. Does not report any syncope or seizures. He does not report any skeletal complaints of arthralgias or myalgias. He does not report any chest pain, shortness of breath, palpitation orthopnea or PND. He does not report any shortness of breath or wheezing. His performance status and activity level remains excellent. Rest of his review of systems unremarkable.  Medications: I have reviewed the patient's current medications.  Current Outpatient Prescriptions  Medication Sig Dispense Refill  . acetaminophen (TYLENOL) 500 MG tablet Take 500-1,000 mg by mouth every 6 (six) hours as needed (For pain.). Reported on 09/19/2015    . losartan (COZAAR) 100 MG tablet Take 100 mg by mouth every morning.     . Multiple Vitamin (MULTIVITAMIN WITH MINERALS) TABS tablet Take 1 tablet by mouth daily.    [name] oxyCODONE-acetaminophen (PERCOCET) 10-325 MG tablet Take 1 tablet by mouth as directed.    . lidocaine-prilocaine (EMLA) cream APPLY TOPICALLY TO PORT-A-CATH DAILY AS NEEDED (Patient not taking: Reported on 03/28/2016) 30 g 1   No current facility-administered medications for this visit.      Allergies: No Known Allergies  Past Medical History, Surgical history, Social history, and Family History were reviewed and updated.   Physical Exam: Blood pressure 138/79, pulse (!) 57, temperature 99 F (37.2 C), temperature source Oral, resp. rate 18, height 6' 1"  (1.854 m), weight 162 lb 6 oz (73.7 kg), SpO2 100 %. ECOG: 1 General appearance: Well appearing man without distress. Head: Normocephalic, without obvious abnormality  no oral thrush noted. Neck: no adenopathy Lymph nodes: Cervical, supraclavicular, and axillary nodes normal. Heart:regular rate and rhythm, S1, S2 normal, no  murmur, click, rub or gallop Lung:chest clear, no wheezing, rales, normal symmetric air entry Abdomin: soft, non-tender, without masses or organomegaly no rebound or guarding. EXT:no erythema, induration, or nodules Neurological exam showed no deficits or lesions.  Lab Results: Lab Results  Component Value Date   WBC 3.7 (L) 03/21/2016   HGB 13.9 03/21/2016   HCT 41.7 03/21/2016   MCV 95.4 03/21/2016   PLT 153 03/21/2016     Chemistry      Component Value Date/Time   NA 138 03/21/2016 0853   K 4.9 03/21/2016 0853   CL 100 (L) 12/27/2015 1541   CL 105 01/01/2013 0835   CO2 24 03/21/2016 0853   BUN 13.4 03/21/2016 0853   CREATININE 0.9 03/21/2016 0853      Component Value Date/Time   CALCIUM 9.5 03/21/2016 0853   ALKPHOS 71 03/21/2016 0853   AST 25 03/21/2016 0853   ALT 29 03/21/2016 0853   BILITOT 0.45 03/21/2016 0853      Results for CRISTINO, DEGROFF (MRN [id number]) as of 03/28/2016 08:50  Ref. Range 09/19/2015 08:46 03/21/2016 08:53  M Protein SerPl Elph-Mcnc Latest Ref Range: Not Observed g/dL Not Observed Not Observed  Results for DANIEL, RITTHALER (MRN [id number]) as of 03/28/2016 08:50  Ref. Range 03/21/2015 09:23 09/19/2015 08:46 03/21/2016 08:53  IgG (Immunoglobin G), Serum Latest Ref Range: 700 - 1600 mg/dL 1,030 957 1,057    Results for JAVED, COTTO (MRN [id number]) as of 03/28/2016 08:50  Ref. Range 03/21/2016 08:53  Ig Kappa Free Light Chain Latest Ref Range: 3.3 - 19.4 mg/L 15.6  Ig Lambda Free Light Chain Latest Ref Range: 5.7 - 26.3 mg/L 15.7  Kappa/Lambda FluidC Ratio Latest Ref Range: 0.26 - 1.65  0.99    Impression and Plan:  66 year old gentleman with the following issues  1.IgG Kappa Multiple Myeloma: He is status post definitive therapy with Cytoxan, Velcade and dexamethasone and achieved complete response in April 2015. He continues to be on active surveillance after refusing autologous stem cell transplant.  His protein studies from September  2017 were reviewed today and continue to show complete response. He has no M spike noted with normal quantitative immunoglobulins and serum light chains.  The plan is to continue with active surveillance and repeat protein studies in 6 months.   2. Prostate cancer: He is status post prostatectomy on 02/09/2014. His pathology showed a Gleason score 4+3 equals 7 with disease involving both lobes. He has extraprostatic extension but no lymph node involvement.   He continues to be on active surveillance under the care of urology last PSA remains very low.  3. Chronic back pain: Seems to be manageable at this time.  4. Port-A-Cath flush: This will be arranged every 2 months. He elected to keep the Port-A-Cath because of his poor venous access.  5. Small bowel obstruction: Appears to have resolved without surgical intervention.  6. Age-appropriate cancer screening: He is due for colonoscopy and based on his request to have referred him and a repeat colonoscopy in the near future.  7. Followup: Will be in 6 months.    [facility], MD 9/14/20178:57 AM

## 2016-04-12 MED FILL — OXYCODONE-APAP 10-325: 10-325 | 30 days supply | Qty: 60 | Fill #0

## 2016-05-16 ENCOUNTER — Ambulatory Visit (HOSPITAL_BASED_OUTPATIENT_CLINIC_OR_DEPARTMENT_OTHER): Payer: Medicare Other

## 2016-05-16 DIAGNOSIS — Z452 Encounter for adjustment and management of vascular access device: Secondary | ICD-10-CM

## 2016-05-16 DIAGNOSIS — C9001 Multiple myeloma in remission: Secondary | ICD-10-CM

## 2016-05-16 DIAGNOSIS — C61 Malignant neoplasm of prostate: Secondary | ICD-10-CM

## 2016-05-16 MED ORDER — HEPARIN SOD (PORK) LOCK FLUSH 100 UNIT/ML IV SOLN
500.0000 [IU] | Freq: Once | INTRAVENOUS | Status: AC | PRN
  Administered 2016-05-16: 500 [IU] via INTRAVENOUS
  Filled 2016-05-16: qty 5

## 2016-05-16 MED ORDER — SODIUM CHLORIDE 0.9 % IJ SOLN
10.0000 mL | INTRAMUSCULAR | Status: DC | PRN
  Administered 2016-05-16: 10 mL via INTRAVENOUS
  Filled 2016-05-16: qty 10

## 2016-05-18 ENCOUNTER — Observation Stay (HOSPITAL_COMMUNITY)
Admission: EM | Admit: 2016-05-18 | Discharge: 2016-05-19 | Disposition: A | Discharge status: Home / Self Care | Payer: Medicare Other | Attending: Internal Medicine | Admitting: Internal Medicine

## 2016-05-18 ENCOUNTER — Encounter (HOSPITAL_COMMUNITY): Payer: Self-pay | Admitting: Oncology

## 2016-05-18 ENCOUNTER — Emergency Department (HOSPITAL_COMMUNITY): Payer: Medicare Other

## 2016-05-18 DIAGNOSIS — Z8546 Personal history of malignant neoplasm of prostate: Secondary | ICD-10-CM | POA: Diagnosis not present

## 2016-05-18 DIAGNOSIS — M549 Dorsalgia, unspecified: Secondary | ICD-10-CM

## 2016-05-18 DIAGNOSIS — Z85820 Personal history of malignant melanoma of skin: Secondary | ICD-10-CM | POA: Diagnosis not present

## 2016-05-18 DIAGNOSIS — Z79899 Other long term (current) drug therapy: Secondary | ICD-10-CM | POA: Diagnosis not present

## 2016-05-18 DIAGNOSIS — K56609 Unspecified intestinal obstruction, unspecified as to partial versus complete obstruction: Secondary | ICD-10-CM

## 2016-05-18 DIAGNOSIS — C9000 Multiple myeloma not having achieved remission: Secondary | ICD-10-CM | POA: Diagnosis present

## 2016-05-18 DIAGNOSIS — R1084 Generalized abdominal pain: Principal | ICD-10-CM | POA: Insufficient documentation

## 2016-05-18 DIAGNOSIS — D472 Monoclonal gammopathy: Secondary | ICD-10-CM | POA: Diagnosis present

## 2016-05-18 DIAGNOSIS — G8929 Other chronic pain: Secondary | ICD-10-CM | POA: Diagnosis present

## 2016-05-18 DIAGNOSIS — Z87891 Personal history of nicotine dependence: Secondary | ICD-10-CM | POA: Diagnosis not present

## 2016-05-18 DIAGNOSIS — C61 Malignant neoplasm of prostate: Secondary | ICD-10-CM | POA: Diagnosis present

## 2016-05-18 DIAGNOSIS — I10 Essential (primary) hypertension: Secondary | ICD-10-CM | POA: Diagnosis present

## 2016-05-18 DIAGNOSIS — R109 Unspecified abdominal pain: Secondary | ICD-10-CM | POA: Diagnosis not present

## 2016-05-18 DIAGNOSIS — R1011 Right upper quadrant pain: Secondary | ICD-10-CM | POA: Diagnosis present

## 2016-05-18 LAB — URINALYSIS, ROUTINE W REFLEX MICROSCOPIC
Bilirubin Urine: NEGATIVE
Glucose, UA: NEGATIVE mg/dL
Hgb urine dipstick: NEGATIVE
Ketones, ur: NEGATIVE mg/dL
LEUKOCYTES UA: NEGATIVE
Nitrite: NEGATIVE
PROTEIN: NEGATIVE mg/dL
Specific Gravity, Urine: 1.016 (ref 1.005–1.030)
pH: 6.5 (ref 5.0–8.0)

## 2016-05-18 LAB — LIPASE, BLOOD: LIPASE: 23 U/L (ref 11–51)

## 2016-05-18 LAB — I-STAT CG4 LACTIC ACID, ED
LACTIC ACID, VENOUS: 2.37 mmol/L — AB (ref 0.5–1.9)
LACTIC ACID, VENOUS: 2.25 mmol/L — AB (ref 0.5–1.9)

## 2016-05-18 LAB — CBC
HCT: 43.2 % (ref 39.0–52.0)
Hemoglobin: 14.8 g/dL (ref 13.0–17.0)
MCH: 32.4 pg (ref 26.0–34.0)
MCHC: 34.3 g/dL (ref 30.0–36.0)
MCV: 94.5 fL (ref 78.0–100.0)
PLATELETS: 129 10*3/uL — AB (ref 150–400)
RBC: 4.57 MIL/uL (ref 4.22–5.81)
RDW: 12.6 % (ref 11.5–15.5)
WBC: 9.7 10*3/uL (ref 4.0–10.5)

## 2016-05-18 LAB — COMPREHENSIVE METABOLIC PANEL
ALK PHOS: 54 U/L (ref 38–126)
ALT: 22 U/L (ref 17–63)
AST: 29 U/L (ref 15–41)
Albumin: 4.4 g/dL (ref 3.5–5.0)
Anion gap: 7 (ref 5–15)
BUN: 14 mg/dL (ref 6–20)
CALCIUM: 9.6 mg/dL (ref 8.9–10.3)
CHLORIDE: 104 mmol/L (ref 101–111)
CO2: 27 mmol/L (ref 22–32)
CREATININE: 0.86 mg/dL (ref 0.61–1.24)
Glucose, Bld: 96 mg/dL (ref 65–99)
Potassium: 5.3 mmol/L — ABNORMAL HIGH (ref 3.5–5.1)
Sodium: 138 mmol/L (ref 135–145)
Total Bilirubin: 0.9 mg/dL (ref 0.3–1.2)
Total Protein: 7.6 g/dL (ref 6.5–8.1)

## 2016-05-18 LAB — PROTIME-INR
INR: 1.03
Prothrombin Time: 13.5 seconds (ref 11.4–15.2)

## 2016-05-18 LAB — TSH: TSH: 0.579 u[IU]/mL (ref 0.350–4.500)

## 2016-05-18 MED ORDER — ACETAMINOPHEN 325 MG PO TABS: 650.0000 mg | ORAL_TABLET | Freq: Four times a day (QID) | ORAL | Status: DC | PRN

## 2016-05-18 MED ORDER — ACETAMINOPHEN 650 MG RE SUPP: 650.0000 mg | Freq: Four times a day (QID) | RECTAL | Status: DC | PRN

## 2016-05-18 MED ORDER — ONDANSETRON HCL 4 MG/2ML IJ SOLN: 4.0000 mg | Freq: Four times a day (QID) | INTRAMUSCULAR | Status: DC | PRN

## 2016-05-18 MED ORDER — ADULT MULTIVITAMIN W/MINERALS CH
1.0000 | ORAL_TABLET | Freq: Every day | ORAL | Status: DC
  Administered 2016-05-18 – 2016-05-19 (×2): 1 via ORAL
  Filled 2016-05-18 (×2): qty 1

## 2016-05-18 MED ORDER — IOPAMIDOL (ISOVUE-300) INJECTION 61%
100.0000 mL | Freq: Once | INTRAVENOUS | Status: AC | PRN
  Administered 2016-05-18: 100 mL via INTRAVENOUS

## 2016-05-18 MED ORDER — HEPARIN SODIUM (PORCINE) 5000 UNIT/ML IJ SOLN
5000.0000 [IU] | Freq: Three times a day (TID) | INTRAMUSCULAR | Status: DC
  Administered 2016-05-18 – 2016-05-19 (×2): 5000 [IU] via SUBCUTANEOUS
  Filled 2016-05-18: qty 1

## 2016-05-18 MED ORDER — MORPHINE SULFATE (PF) 2 MG/ML IV SOLN
4.0000 mg | Freq: Once | INTRAVENOUS | Status: AC
  Administered 2016-05-18: 4 mg via INTRAVENOUS
  Filled 2016-05-18: qty 2

## 2016-05-18 MED ORDER — POLYETHYLENE GLYCOL 3350 17 G PO PACK
17.0000 g | PACK | Freq: Every day | ORAL | Status: DC
  Administered 2016-05-18 – 2016-05-19 (×2): 17 g via ORAL
  Filled 2016-05-18 (×2): qty 1

## 2016-05-18 MED ORDER — MORPHINE SULFATE (PF) 2 MG/ML IV SOLN
1.0000 mg | INTRAVENOUS | Status: DC | PRN
Start: 2016-05-18 — End: 2016-05-19
  Administered 2016-05-18: 1 mg via INTRAVENOUS
  Filled 2016-05-18: qty 1

## 2016-05-18 MED ORDER — ONDANSETRON HCL 4 MG PO TABS: 4.0000 mg | ORAL_TABLET | Freq: Four times a day (QID) | ORAL | Status: DC | PRN

## 2016-05-18 MED ORDER — LOSARTAN POTASSIUM 50 MG PO TABS
100.0000 mg | ORAL_TABLET | Freq: Every morning | ORAL | Status: DC
  Administered 2016-05-18 – 2016-05-19 (×2): 100 mg via ORAL
  Filled 2016-05-18 (×2): qty 2

## 2016-05-18 MED ORDER — SODIUM CHLORIDE 0.9 % IV SOLN
INTRAVENOUS | Status: DC
  Administered 2016-05-18: 14:00:00 via INTRAVENOUS

## 2016-05-18 MED ORDER — OXYCODONE-ACETAMINOPHEN 10-325 MG PO TABS: 1.0000 | ORAL_TABLET | Freq: Four times a day (QID) | ORAL | Status: DC | PRN

## 2016-05-18 MED ORDER — OXYCODONE HCL 5 MG PO TABS
5.0000 mg | ORAL_TABLET | Freq: Four times a day (QID) | ORAL | Status: DC | PRN
  Administered 2016-05-18: 5 mg via ORAL
  Filled 2016-05-18: qty 1

## 2016-05-18 MED ORDER — SODIUM CHLORIDE 0.9% FLUSH: 3.0000 mL | Freq: Two times a day (BID) | INTRAVENOUS | Status: DC

## 2016-05-18 MED ORDER — OXYCODONE-ACETAMINOPHEN 5-325 MG PO TABS
1.0000 | ORAL_TABLET | Freq: Four times a day (QID) | ORAL | Status: DC | PRN
  Administered 2016-05-18: 1 via ORAL
  Filled 2016-05-18: qty 1

## 2016-05-18 MED ORDER — SODIUM CHLORIDE 0.9 % IV BOLUS (SEPSIS)
1000.0000 mL | Freq: Once | INTRAVENOUS | Status: AC
Start: 2016-05-18 — End: 2016-05-18
  Administered 2016-05-18: 1000 mL via INTRAVENOUS

## 2016-05-18 NOTE — ED Notes (Signed)
Patient made aware of urine sample. Urinal placed at bedside and patient encouraged to void when able. 
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
## 2016-05-18 NOTE — ED Notes (Signed)
Obtained IV access. Unable to obtain labs. Phlebotomy made aware.

## 2016-05-18 NOTE — ED Notes (Signed)
Patient refused to let RN access port at this time.

## 2016-05-18 NOTE — ED Notes (Signed)
MD at bedside. 

## 2016-05-18 NOTE — ED Triage Notes (Signed)
Pt c/o RUQ/RLQ abdominal pain 10/10, sharp and stabbing in nature.  Pt reports eating greasy food as well as drinking a carton of orange juice yesterday.  Per pt pain developed around 0200 and has gotten progressively worse.  Pt has hx of SBO x 2.  Normal BM approximately 2 hours ago.
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
## 2016-05-19 DIAGNOSIS — R1084 Generalized abdominal pain: Principal | ICD-10-CM

## 2016-05-19 DIAGNOSIS — I10 Essential (primary) hypertension: Secondary | ICD-10-CM | POA: Diagnosis not present

## 2016-05-19 DIAGNOSIS — M549 Dorsalgia, unspecified: Secondary | ICD-10-CM | POA: Diagnosis not present

## 2016-05-19 DIAGNOSIS — K56609 Unspecified intestinal obstruction, unspecified as to partial versus complete obstruction: Secondary | ICD-10-CM | POA: Diagnosis not present

## 2016-05-19 DIAGNOSIS — C9001 Multiple myeloma in remission: Secondary | ICD-10-CM

## 2016-05-19 LAB — CBC
HCT: 41 % (ref 39.0–52.0)
Hemoglobin: 13.8 g/dL (ref 13.0–17.0)
MCH: 31.8 pg (ref 26.0–34.0)
MCHC: 33.7 g/dL (ref 30.0–36.0)
MCV: 94.5 fL (ref 78.0–100.0)
PLATELETS: 148 10*3/uL — AB (ref 150–400)
RBC: 4.34 MIL/uL (ref 4.22–5.81)
RDW: 12.8 % (ref 11.5–15.5)
WBC: 6.1 10*3/uL (ref 4.0–10.5)

## 2016-05-19 LAB — BASIC METABOLIC PANEL
Anion gap: 7 (ref 5–15)
BUN: 11 mg/dL (ref 6–20)
CALCIUM: 8.8 mg/dL — AB (ref 8.9–10.3)
CHLORIDE: 104 mmol/L (ref 101–111)
CO2: 25 mmol/L (ref 22–32)
CREATININE: 0.79 mg/dL (ref 0.61–1.24)
Glucose, Bld: 90 mg/dL (ref 65–99)
Potassium: 4.4 mmol/L (ref 3.5–5.1)
SODIUM: 136 mmol/L (ref 135–145)

## 2016-05-19 LAB — MAGNESIUM: MAGNESIUM: 2.2 mg/dL (ref 1.7–2.4)

## 2016-05-19 LAB — HEMOGLOBIN A1C
Hgb A1c MFr Bld: 5.3 % (ref 4.8–5.6)
MEAN PLASMA GLUCOSE: 105 mg/dL

## 2016-05-19 MED ORDER — POLYETHYLENE GLYCOL 3350 17 G PO PACK: 17.0000 g | PACK | Freq: Every day | ORAL | 0 refills | Status: DC | PRN

## 2016-05-19 NOTE — Progress Notes (Signed)
Patient discharged to home, discharge instructions reviewed with patient who verbalized understanding.  

## 2016-05-20 MED FILL — POLYETHYLENE GLYCOL 3350: 15 days supply | Qty: 255 | Fill #0

## 2016-06-02 NOTE — Discharge Summary (Signed)
Physician Discharge Summary  TAQUAN BRALLEY NTZ:[id number] DOB: September 25, 1950 DOA: 05/18/2016  PCP: [name], MD  Admit date: 05/18/2016 Discharge date: 06/02/2016  Admitted From: home  Disposition:  home     Discharge Condition:  stable   CODE STATUS:  Full code   Diet recommendation:  Heart healthy Consultations:      Discharge Diagnoses:  Principal Problem:   SBO (small bowel obstruction) Active Problems:   MGUS (monoclonal gammopathy of unknown significance)   Multiple myeloma ([facility])   Prostate cancer ([place])   Essential hypertension   Chronic back pain   Abdominal pain    Subjective: No abdominal pain, has had BMs. No vomiting.   Brief Summary: Travis Nguyen is a 65 y.o. male with medical history significant of prostate cancer, chronic back pain and history of multiple myeloma on remission came into the hospital complaining about abdominal pain. Patient started yesterday, after he ate fried food, it's mainly in the RUQ, no radiation. Has history of cholecystectomy, but she came in because the pain as similar to his previous SBO. CT scan showed partial SBO, last bowel movement was 5 AM on the day of admission   Hospital Course:  Partial small bowel obstruction -Reported no nausea or vomiting, only colicky abdominal pain, CT showed PSBO with transition point in the pelvis. -has had multiple BMs and has had resolution of pain- tolerating solid food- stable to be discharged  Prostate cancer -Follow as outpatient.  Chronic back pain -Patient is on Percocet at home, reportedly did not take it for the past 2 days.  History of multiple myeloma -Per records, in remission, follow-up with oncology as outpatient.   DVT prophylaxis: SQ Heparin Code Status: Full code  Discharge Instructions  Discharge Instructions    Diet - low sodium heart healthy    Complete by:  As directed    Increase activity slowly    Complete by:  As directed         Medication List    TAKE these medications   acetaminophen 500 MG tablet Commonly known as:  TYLENOL Take 500-1,000 mg by mouth every 6 (six) hours as needed (For pain.). Reported on 09/19/2015   lidocaine-prilocaine cream Commonly known as:  EMLA APPLY TOPICALLY TO PORT-A-CATH DAILY AS NEEDED   losartan 100 MG tablet Commonly known as:  COZAAR Take 100 mg by mouth every morning.   multivitamin with minerals Tabs tablet Take 1 tablet by mouth daily.   oxyCODONE-acetaminophen 10-325 MG tablet Commonly known as:  PERCOCET Take 1 tablet by mouth every 6 (six) hours as needed.   polyethylene glycol packet Commonly known as:  MIRALAX / GLYCOLAX Take 17 g by mouth daily as needed for mild constipation.       No Known Allergies   Procedures/Studies:   Ct Abdomen Pelvis W Contrast  Result Date: 05/18/2016 CLINICAL DATA:  Pt c/o RUQ/RLQ abdominal pain. Per pt pain developed around 0200 and has gotten progressively worse. Pt has hx of SBO x 2 EXAM: CT ABDOMEN AND PELVIS WITH CONTRAST TECHNIQUE: Multidetector CT imaging of the abdomen and pelvis was performed using the standard protocol following bolus administration of intravenous contrast. CONTRAST:  123m ISOVUE-300 IOPAMIDOL (ISOVUE-300) INJECTION 61% COMPARISON:  12/27/2015 FINDINGS: Lower chest: No acute abnormality. 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 No other liver abnormality. Mild chronic intra and extrahepatic bile duct dilation. Status post cholecystectomy. Pancreas: Mild chronic dilation of the pancreatic duct. Pancreas otherwise unremarkable. Spleen:  Normal in size without focal abnormality. Adrenals/Urinary Tract: No adrenal masses. Bilateral low attenuation renal masses consistent with cysts, largest from the midpole of the right kidney measuring 19 mm, stable. No hydronephrosis. Normal ureters. Normal bladder. Stomach/Bowel: Stomach is moderately distended but otherwise unremarkable.  There is small bowel dilation most evident of the distal jejunum and proximal ileum. There is an apparent transition point in the low left anterior pelvis. Small bowel distal to this including the terminal ileum is decompressed. No colonic dilation, wall thickening or inflammation. Vascular/Lymphatic: No significant vascular findings are present. No enlarged abdominal or pelvic lymph nodes. Reproductive: Prostate is unremarkable. Other: Trace ascites.  No abdominal wall hernia. 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 No osteoblastic or osteolytic lesions. IMPRESSION: 1. Partial small bowel obstruction with an apparent transition point in the left low anterior pelvis. 2. No other acute abnormalities within the abdomen or pelvis. Electronically Signed   By: Lajean Manes M.D.   On: 05/18/2016 10:18        Discharge Exam: Vitals:   05/18/16 2140 05/19/16 0600  BP: 131/78 120/76  Pulse: 76 62  Resp: 20 18  Temp: 99.6 F (37.6 C) 99.4 F (37.4 C)   Vitals:   05/18/16 1200 05/18/16 1218 05/18/16 2140 05/19/16 0600  BP: 128/87 137/79 131/78 120/76  Pulse: 67 85 76 62  Resp: _0 Temp:  98.3 F (36.8 C) 99.6 F (37.6 C) 99.4 F (37.4 C)  TempSrc:  Oral Oral Oral  SpO2: 97% 98% 100% 100%  Weight:  70.3 kg (155 lb)    Height:        General: Pt is alert, awake, not in acute distress Cardiovascular: RRR, S1/S2 +, no rubs, no gallops Respiratory: CTA bilaterally, no wheezing, no rhonchi Abdominal: Soft, NT, ND, bowel sounds + Extremities: no edema, no cyanosis    The results of significant diagnostics from this hospitalization (including imaging, microbiology, ancillary and laboratory) are listed below for reference.     Microbiology: No results found for this or any previous visit (from the past 240 hour(s)).   Labs: BNP (last 3 results) No results for input(s): BNP in  the last 8760 hours. Basic Metabolic Panel: No results for input(s): NA, K, CL, CO2, GLUCOSE, BUN, CREATININE, CALCIUM, MG, PHOS in the last 168 hours. Liver Function Tests: No results for input(s): AST, ALT, ALKPHOS, BILITOT, PROT, ALBUMIN in the last 168 hours. No results for input(s): LIPASE, AMYLASE in the last 168 hours. No results for input(s): AMMONIA in the last 168 hours. CBC: No results for input(s): WBC, NEUTROABS, HGB, HCT, MCV, PLT in the last 168 hours. Cardiac Enzymes: No results for input(s): CKTOTAL, CKMB, CKMBINDEX, TROPONINI in the last 168 hours. BNP: Invalid input(s): POCBNP CBG: No results for input(s): GLUCAP in the last 168 hours. D-Dimer No results for input(s): DDIMER in the last 72 hours. Hgb A1c No results for input(s): HGBA1C in the last 72 hours. Lipid Profile No results for input(s): CHOL, HDL, LDLCALC, TRIG, CHOLHDL, LDLDIRECT in the last 72 hours. Thyroid function studies No results for input(s): TSH, T4TOTAL, T3FREE, THYROIDAB in the last 72 hours.  Invalid input(s): FREET3 Anemia work up No results for input(s): VITAMINB12, FOLATE, FERRITIN, TIBC, IRON, RETICCTPCT in the last 72 hours. Urinalysis    Component Value Date/Time   COLORURINE YELLOW 05/18/2016 1029   APPEARANCEUR CLEAR 05/18/2016 1029   LABSPEC 1.016 05/18/2016 1029  PHURINE 6.5 05/18/2016 1029   GLUCOSEU NEGATIVE 05/18/2016 1029   HGBUR NEGATIVE 05/18/2016 1029   [place] 05/18/2016 1029   KETONESUR NEGATIVE 05/18/2016 1029   PROTEINUR NEGATIVE 05/18/2016 1029   UROBILINOGEN 0.2 03/18/2014 0149   NITRITE NEGATIVE 05/18/2016 1029   LEUKOCYTESUR NEGATIVE 05/18/2016 1029   Sepsis Labs Invalid input(s): PROCALCITONIN,  WBC,  LACTICIDVEN Microbiology No results found for this or any previous visit (from the past 240 hour(s)).   Time coordinating discharge: Over 30 minutes  SIGNED:   Debbe Odea, MD  [facility] 06/02/2016, 2:03 PM Pager   If  7PM-7AM, please contact night-coverage [email or web address] Password [email or web address]

## 2016-06-11 MED FILL — OXYCODONE-APAP 10-325: 10-325 | 30 days supply | Qty: 60 | Fill #0

## 2016-06-13 ENCOUNTER — Encounter: Payer: Self-pay | Admitting: Oncology

## 2016-07-10 MED FILL — OXYCODONE-APAP 10-325: 10-325 | 30 days supply | Qty: 60 | Fill #0

## 2016-07-11 ENCOUNTER — Ambulatory Visit (HOSPITAL_BASED_OUTPATIENT_CLINIC_OR_DEPARTMENT_OTHER): Payer: Medicare Other

## 2016-07-11 DIAGNOSIS — C9001 Multiple myeloma in remission: Secondary | ICD-10-CM

## 2016-07-11 DIAGNOSIS — Z452 Encounter for adjustment and management of vascular access device: Secondary | ICD-10-CM | POA: Diagnosis not present

## 2016-07-11 DIAGNOSIS — C61 Malignant neoplasm of prostate: Secondary | ICD-10-CM

## 2016-07-11 MED ORDER — HEPARIN SOD (PORK) LOCK FLUSH 100 UNIT/ML IV SOLN
500.0000 [IU] | Freq: Once | INTRAVENOUS | Status: AC | PRN
  Administered 2016-07-11: 500 [IU] via INTRAVENOUS
  Filled 2016-07-11: qty 5

## 2016-07-11 MED ORDER — SODIUM CHLORIDE 0.9 % IJ SOLN
10.0000 mL | INTRAMUSCULAR | Status: DC | PRN
  Administered 2016-07-11: 10 mL via INTRAVENOUS
  Filled 2016-07-11: qty 10

## 2016-07-11 MED FILL — LOSARTAN POTASSIUM 100 MG T: 100 | 90 days supply | Qty: 90 | Fill #1

## 2016-07-23 MED FILL — IBUPROFEN 600 MG TABLET: 600 | 5 days supply | Qty: 20 | Fill #0

## 2016-08-09 MED FILL — OXYCODONE-APAP 10-325: 10-325 | 30 days supply | Qty: 60 | Fill #0

## 2016-08-09 MED FILL — ACETAMINOPHEN/COD #3 TABLET: 300-30 | 4 days supply | Qty: 14 | Fill #0

## 2016-08-09 MED FILL — AMOXICILLIN 500 MG CAPSULE: 500 | 8 days supply | Qty: 24 | Fill #0

## 2016-08-22 MED FILL — traMADol HCL 50 MG TABS: 50 | 4 days supply | Qty: 14 | Fill #0

## 2016-09-04 ENCOUNTER — Telehealth: Payer: Self-pay | Admitting: Oncology

## 2016-09-04 NOTE — Telephone Encounter (Signed)
Patient came to scheduling to reschedule flush appointment per conflict with a prior scheduled appointment.

## 2016-09-06 ENCOUNTER — Ambulatory Visit (HOSPITAL_BASED_OUTPATIENT_CLINIC_OR_DEPARTMENT_OTHER): Payer: Medicare Other

## 2016-09-06 VITALS — BP 150/94 | HR 67 | Temp 98.1°F | Resp 20

## 2016-09-06 DIAGNOSIS — C9001 Multiple myeloma in remission: Secondary | ICD-10-CM | POA: Diagnosis not present

## 2016-09-06 DIAGNOSIS — Z452 Encounter for adjustment and management of vascular access device: Secondary | ICD-10-CM

## 2016-09-06 DIAGNOSIS — C61 Malignant neoplasm of prostate: Secondary | ICD-10-CM

## 2016-09-06 MED ORDER — HEPARIN SOD (PORK) LOCK FLUSH 100 UNIT/ML IV SOLN
500.0000 [IU] | Freq: Once | INTRAVENOUS | Status: AC
  Administered 2016-09-06: 500 [IU] via INTRAVENOUS
  Filled 2016-09-06: qty 5

## 2016-09-06 MED ORDER — SODIUM CHLORIDE 0.9% FLUSH
10.0000 mL | INTRAVENOUS | Status: DC | PRN
  Administered 2016-09-06: 10 mL via INTRAVENOUS
  Filled 2016-09-06: qty 10

## 2016-09-06 NOTE — Patient Instructions (Signed)

## 2016-09-09 MED FILL — OXYCODONE-APAP 10-325 TAB: 10-325 | 30 days supply | Qty: 60 | Fill #0

## 2016-09-09 MED FILL — LIDOCAINE-PRILOCAINE CREAM: 2.5-2.5 | 20 days supply | Qty: 30 | Fill #1

## 2016-09-19 ENCOUNTER — Other Ambulatory Visit (HOSPITAL_BASED_OUTPATIENT_CLINIC_OR_DEPARTMENT_OTHER): Payer: Medicare Other

## 2016-09-19 ENCOUNTER — Ambulatory Visit (HOSPITAL_BASED_OUTPATIENT_CLINIC_OR_DEPARTMENT_OTHER): Payer: Medicare Other

## 2016-09-19 VITALS — BP 171/88 | HR 64 | Temp 98.4°F | Resp 18

## 2016-09-19 DIAGNOSIS — C9001 Multiple myeloma in remission: Secondary | ICD-10-CM

## 2016-09-19 DIAGNOSIS — C61 Malignant neoplasm of prostate: Secondary | ICD-10-CM

## 2016-09-19 LAB — CBC WITH DIFFERENTIAL/PLATELET
BASO%: 0.4 % (ref 0.0–2.0)
Basophils Absolute: 0 10*3/uL (ref 0.0–0.1)
EOS%: 1.3 % (ref 0.0–7.0)
Eosinophils Absolute: 0.1 10*3/uL (ref 0.0–0.5)
HEMATOCRIT: 39.3 % (ref 38.4–49.9)
HEMOGLOBIN: 13.4 g/dL (ref 13.0–17.1)
LYMPH#: 1.8 10*3/uL (ref 0.9–3.3)
LYMPH%: 38.9 % (ref 14.0–49.0)
MCH: 31.9 pg (ref 27.2–33.4)
MCHC: 34.1 g/dL (ref 32.0–36.0)
MCV: 93.6 fL (ref 79.3–98.0)
MONO#: 0.4 10*3/uL (ref 0.1–0.9)
MONO%: 7.5 % (ref 0.0–14.0)
NEUT#: 2.4 10*3/uL (ref 1.5–6.5)
NEUT%: 51.9 % (ref 39.0–75.0)
Platelets: 145 10*3/uL (ref 140–400)
RBC: 4.2 10*6/uL (ref 4.20–5.82)
RDW: 12.8 % (ref 11.0–14.6)
WBC: 4.7 10*3/uL (ref 4.0–10.3)

## 2016-09-19 LAB — COMPREHENSIVE METABOLIC PANEL
ALT: 19 U/L (ref 0–55)
AST: 23 U/L (ref 5–34)
Albumin: 4.1 g/dL (ref 3.5–5.0)
Alkaline Phosphatase: 73 U/L (ref 40–150)
Anion Gap: 8 mEq/L (ref 3–11)
BUN: 12.8 mg/dL (ref 7.0–26.0)
CALCIUM: 9.9 mg/dL (ref 8.4–10.4)
CHLORIDE: 104 meq/L (ref 98–109)
CO2: 26 mEq/L (ref 22–29)
CREATININE: 0.9 mg/dL (ref 0.7–1.3)
EGFR: >90 mL/min/{1.73_m2} (ref >90)
GLUCOSE: 93 mg/dL (ref 70–140)
POTASSIUM: 4.5 meq/L (ref 3.5–5.1)
SODIUM: 138 meq/L (ref 136–145)
Total Bilirubin: 0.34 mg/dL (ref 0.20–1.20)
Total Protein: 7.6 g/dL (ref 6.4–8.3)

## 2016-09-19 MED ORDER — SODIUM CHLORIDE 0.9 % IJ SOLN
10.0000 mL | INTRAMUSCULAR | Status: DC | PRN
  Administered 2016-09-19: 10 mL via INTRAVENOUS
  Filled 2016-09-19: qty 10

## 2016-09-19 MED ORDER — HEPARIN SOD (PORK) LOCK FLUSH 100 UNIT/ML IV SOLN
500.0000 [IU] | Freq: Once | INTRAVENOUS | Status: AC | PRN
  Administered 2016-09-19: 500 [IU] via INTRAVENOUS
  Filled 2016-09-19: qty 5

## 2016-09-19 NOTE — Patient Instructions (Signed)
Implanted Port Home Guide An implanted port is a type of central line that is placed under the skin. Central lines are used to provide IV access when treatment or nutrition needs to be given through a person's veins. Implanted ports are used for long-term IV access. An implanted port may be placed because:  You need IV medicine that would be irritating to the small veins in your hands or arms.  You need long-term IV medicines, such as antibiotics.  You need IV nutrition for a long period.  You need frequent blood draws for lab tests.  You need dialysis.  Implanted ports are usually placed in the chest area, but they can also be placed in the upper arm, the abdomen, or the leg. An implanted port has two main parts:  Reservoir. The reservoir is round and will appear as a small, raised area under your skin. The reservoir is the part where a needle is inserted to give medicines or draw blood.  Catheter. The catheter is a thin, flexible tube that extends from the reservoir. The catheter is placed into a large vein. Medicine that is inserted into the reservoir goes into the catheter and then into the vein.  How will I care for my incision site? Do not get the incision site wet. Bathe or shower as directed by your health care provider. How is my port accessed? Special steps must be taken to access the port:  Before the port is accessed, a numbing cream can be placed on the skin. This helps numb the skin over the port site.  Your health care provider uses a sterile technique to access the port. ? Your health care provider must put on a mask and sterile gloves. ? The skin over your port is cleaned carefully with an antiseptic and allowed to dry. ? The port is gently pinched between sterile gloves, and a needle is inserted into the port.  Only "non-coring" port needles should be used to access the port. Once the port is accessed, a blood return should be checked. This helps ensure that the port  is in the vein and is not clogged.  If your port needs to remain accessed for a constant infusion, a clear (transparent) bandage will be placed over the needle site. The bandage and needle will need to be changed every week, or as directed by your health care provider.  Keep the bandage covering the needle clean and dry. Do not get it wet. Follow your health care provider's instructions on how to take a shower or bath while the port is accessed.  If your port does not need to stay accessed, no bandage is needed over the port.  What is flushing? Flushing helps keep the port from getting clogged. Follow your health care provider's instructions on how and when to flush the port. Ports are usually flushed with saline solution or a medicine called heparin. The need for flushing will depend on how the port is used.  If the port is used for intermittent medicines or blood draws, the port will need to be flushed: ? After medicines have been given. ? After blood has been drawn. ? As part of routine maintenance.  If a constant infusion is running, the port may not need to be flushed.  How long will my port stay implanted? The port can stay in for as long as your health care provider thinks it is needed. When it is time for the port to come out, surgery will be   done to remove it. The procedure is similar to the one performed when the port was put in. When should I seek immediate medical care? When you have an implanted port, you should seek immediate medical care if:  You notice a bad smell coming from the incision site.  You have swelling, redness, or drainage at the incision site.  You have more swelling or pain at the port site or the surrounding area.  You have a fever that is not controlled with medicine.  This information is not intended to replace advice given to you by your health care provider. Make sure you discuss any questions you have with your health care provider. Document  Released: 07/01/2005 Document Revised: 12/07/2015 Document Reviewed: 03/08/2013 Elsevier Interactive Patient Education  2017 [employer]  

## 2016-09-20 LAB — KAPPA/LAMBDA LIGHT CHAINS
Ig Kappa Free Light Chain: 20 mg/L — ABNORMAL HIGH (ref 3.3–19.4)
Ig Lambda Free Light Chain: 13.8 mg/L (ref 5.7–26.3)
Kappa/Lambda FluidC Ratio: 1.45 (ref 0.26–1.65)

## 2016-09-20 LAB — PSA

## 2016-09-24 LAB — MULTIPLE MYELOMA PANEL, SERUM
Albumin SerPl Elph-Mcnc: 3.9 g/dL (ref 2.9–4.4)
Albumin/Glob SerPl: 1.2 (ref 0.7–1.7)
Alpha 1: 0.2 g/dL (ref 0.0–0.4)
Alpha2 Glob SerPl Elph-Mcnc: 0.9 g/dL (ref 0.4–1.0)
B-Globulin SerPl Elph-Mcnc: 1 g/dL (ref 0.7–1.3)
Gamma Glob SerPl Elph-Mcnc: 1.2 g/dL (ref 0.4–1.8)
Globulin, Total: 3.3 g/dL (ref 2.2–3.9)
IGM (IMMUNOGLOBIN M), SRM: 119 mg/dL (ref 20–172)
IgA, Qn, Serum: 186 mg/dL (ref 61–437)
TOTAL PROTEIN: 7.2 g/dL (ref 6.0–8.5)

## 2016-09-26 ENCOUNTER — Other Ambulatory Visit: Payer: Medicare Other

## 2016-09-26 ENCOUNTER — Ambulatory Visit (HOSPITAL_BASED_OUTPATIENT_CLINIC_OR_DEPARTMENT_OTHER): Payer: Medicare Other | Admitting: Oncology

## 2016-09-26 ENCOUNTER — Telehealth: Payer: Self-pay | Admitting: Oncology

## 2016-09-26 VITALS — BP 145/75 | HR 59 | Temp 97.8°F | Resp 18 | Ht 73.0 in | Wt 164.9 lb

## 2016-09-26 DIAGNOSIS — C9001 Multiple myeloma in remission: Secondary | ICD-10-CM | POA: Diagnosis not present

## 2016-09-26 DIAGNOSIS — Z8546 Personal history of malignant neoplasm of prostate: Secondary | ICD-10-CM | POA: Diagnosis not present

## 2016-09-26 NOTE — Telephone Encounter (Signed)
Appointments scheduled per 09/26/16 los. Patient was given a copy of the AVS report and appointment schedule, per 09/26/16 los. °

## 2016-09-26 NOTE — Progress Notes (Signed)
Hematology and Oncology Follow Up Visit  Travis Nguyen [id number] [birth year] 66 y.o. 09/26/2016 8:50 AM Travis Nguyen, Travis Brow, MD   Principle Diagnosis: 66 year old gentleman with the following diagnoses:  1. 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 Initially at smoldering subtype of 11% plasma cell in the bone marrow. He subsequently developed active multiple myeloma in February of 2014 with a 23% plasma cell infiltration. 2. Prostate cancer diagnosed in May of 2014. He presented with an elevated PSA of 13.2 stage TI C. clinical staging. His Gleason score was 3+4 equals 7.  Prior Therapy: He was treated initially with Cytoxan, Velcade and Decadron and subsequently his regimen changed to a Velcade, Revlimid with dexamethasone. He had an excellent response with that an M spike that is no longer detected an IgG level within normal range at April 2015. He did not have any poor prognostic features on his cytogenetics.   He is status post a robotic-assisted laparoscopic radical prostatectomy and bilateral lymph node dissection on 02/09/2014. The final pathology showed prostate adenocarcinoma Gleason score 4+3 equals 7 involving both lobes. Extraprostatic extension was present. The bladder neck margin was negative. Zero out of two lymph nodes were involved.  Current therapy:  He is on observation and surveillance.  Interim History:  Travis Nguyen presents today for a followup visit. Since the last visit, he reports to really well without any complaints. He denied any abdominal pain or hospitalizations. He has been eating well and maintaining his weight. He does not report any neurological deficits. Does not report any peripheral neuropathy or weakness. He denied any pathological fractures or myalgias. He denies any new urinary symptoms including hematuria or dysuria. He denied any issues or complications related to his Port-A-Cath.  He did not report any fevers or chills or  sweats. Does not report any hematuria or dysuria. Does not report any headaches or blurry vision or double vision. Does not report any syncope or seizures. He does not report any skeletal complaints of arthralgias or myalgias. He does not report any chest pain, shortness of breath, palpitation orthopnea or PND. He does not report any shortness of breath or wheezing. His performance status and activity level remains excellent. Rest of his review of systems unremarkable.  Medications: I have reviewed the patient's current medications.  Current Outpatient Prescriptions  Medication Sig Dispense Refill  . acetaminophen (TYLENOL) 500 MG tablet Take 500-1,000 mg by mouth every 6 (six) hours as needed (For pain.). Reported on 09/19/2015    . lidocaine-prilocaine (EMLA) cream APPLY TOPICALLY TO PORT-A-CATH DAILY AS NEEDED 30 g 1  . losartan (COZAAR) 100 MG tablet Take 100 mg by mouth every morning.     . Multiple Vitamin (MULTIVITAMIN WITH MINERALS) TABS tablet Take 1 tablet by mouth daily.    [name] oxyCODONE-acetaminophen (PERCOCET) 10-325 MG tablet Take 1 tablet by mouth every 6 (six) hours as needed.     . polyethylene glycol (MIRALAX / GLYCOLAX) packet Take 17 g by mouth daily as needed for mild constipation. 14 each 0   No current facility-administered medications for this visit.      Allergies: No Known Allergies  Past Medical History, Surgical history, Social history, and Family History were reviewed and updated.   Physical Exam: Blood pressure (!) 145/75, pulse (!) 59, temperature 97.8 F (36.6 C), temperature source Oral, resp. rate 18, height 6' 1"  (1.854 m), weight 164 lb 14.4 oz (74.8 kg), SpO2 100 %. ECOG: 1 General appearance: Alert, awake gentleman without distress.  Head: Normocephalic, without obvious abnormality no oral thrush noted. Neck: no adenopathy Lymph nodes: Cervical, supraclavicular, and axillary nodes normal. Heart:regular rate and rhythm, S1, S2 normal, no murmur, click, rub  or gallop Lung:chest clear, no wheezing, rales, normal symmetric air entry Abdomin: soft, non-tender, without masses or organomegaly no shifting dullness or ascites. EXT:no erythema, induration, or nodules Neurological exam showed no deficits or lesions.  Lab Results: Lab Results  Component Value Date   WBC 4.7 09/19/2016   HGB 13.4 09/19/2016   HCT 39.3 09/19/2016   MCV 93.6 09/19/2016   PLT 145 09/19/2016     Chemistry      Component Value Date/Time   NA 138 09/19/2016 0833   K 4.5 09/19/2016 0833   CL 104 05/19/2016 0345   CL 105 01/01/2013 0835   CO2 26 09/19/2016 0833   BUN 12.8 09/19/2016 0833   CREATININE 0.9 09/19/2016 0833      Component Value Date/Time   CALCIUM 9.9 09/19/2016 0833   ALKPHOS 73 09/19/2016 0833   AST 23 09/19/2016 0833   ALT 19 09/19/2016 0833   BILITOT 0.34 09/19/2016 0833      Results for Travis Nguyen (MRN [id number]) as of 09/26/2016 08:42  Ref. Range 09/19/2016 08:33  Ig Kappa Free Light Chain Latest Ref Range: 3.3 - 19.4 mg/L 20.0 (H)  Ig Lambda Free Light Chain Latest Ref Range: 5.7 - 26.3 mg/L 13.8  Kappa/Lambda FluidC Ratio Latest Ref Range: 0.26 - 1.65  1.45   Results for Travis Nguyen (MRN [id number]) as of 09/26/2016 08:42  Ref. Range 09/19/2016 08:33  PSA Latest Ref Range: 0.0 - 4.0 ng/mL <0.1     Results for Travis Nguyen (MRN [id number]) as of 09/26/2016 08:42  Ref. Range 09/19/2016 08:33  M Protein SerPl Elph-Mcnc Latest Ref Range: Not Observed g/dL Not Observed     Impression and Plan:  66 year old gentleman with the following issues  1.IgG Kappa Multiple Myeloma: He is status post definitive therapy with Cytoxan, Velcade and dexamethasone and achieved complete response in April 2015. He continues to be on active surveillance after refusing autologous stem cell transplant.  His protein studies obtained on 09/19/2016 were reviewed today and continue to show complete response. He has no M spike noted with normal  quantitative immunoglobulins and serum light chains.  The plan is to continue with active surveillance and repeat protein studies in 6 months.   2. Prostate cancer: He is status post prostatectomy on 02/09/2014. His pathology showed a Gleason score 4+3 equals 7 with disease involving both lobes. He has extraprostatic extension but no lymph node involvement.   He continues to be on active surveillance under the care of urology. His PSA remains undetectable in March 2018.  3. Port-A-Cath flush: This will be arranged every 2 months. He elected to keep the Port-A-Cath because of his poor venous access.  4. Small bowel obstruction: Appears to have resolved without surgical intervention. No recent hospitalizations.  5. Age-appropriate cancer screening: He is due for colonoscopy and based on his request to have referred him and a repeat colonoscopy in the near future.  6. Followup: Will be in 6 months.    [facility], MD 3/15/20188:50 AM

## 2016-10-01 MED FILL — traMADol HCL 50 MG TABS: 50 | 3 days supply | Qty: 12 | Fill #0

## 2016-10-03 MED FILL — LOSARTAN POTASSIUM 100 MG T: 100 | 90 days supply | Qty: 90 | Fill #0

## 2016-10-07 MED FILL — OXYCODONE-ACETAMINOPHEN 10-: 10-325 | 30 days supply | Qty: 60 | Fill #0

## 2016-11-06 MED FILL — OXYCODONE-ACETAMINOPHEN 10-: 10-325 | 30 days supply | Qty: 60 | Fill #0

## 2016-11-28 ENCOUNTER — Ambulatory Visit (HOSPITAL_BASED_OUTPATIENT_CLINIC_OR_DEPARTMENT_OTHER): Payer: Medicare Other

## 2016-11-28 ENCOUNTER — Encounter (HOSPITAL_COMMUNITY): Payer: Self-pay | Admitting: Emergency Medicine

## 2016-11-28 ENCOUNTER — Emergency Department (HOSPITAL_COMMUNITY)
Admission: EM | Admit: 2016-11-28 | Discharge: 2016-11-28 | Disposition: A | Discharge status: Home / Self Care | Payer: Medicare Other | Attending: Emergency Medicine | Admitting: Emergency Medicine

## 2016-11-28 ENCOUNTER — Emergency Department (HOSPITAL_COMMUNITY): Payer: Medicare Other

## 2016-11-28 DIAGNOSIS — C61 Malignant neoplasm of prostate: Secondary | ICD-10-CM

## 2016-11-28 DIAGNOSIS — Z87891 Personal history of nicotine dependence: Secondary | ICD-10-CM | POA: Diagnosis not present

## 2016-11-28 DIAGNOSIS — I10 Essential (primary) hypertension: Secondary | ICD-10-CM | POA: Insufficient documentation

## 2016-11-28 DIAGNOSIS — Z452 Encounter for adjustment and management of vascular access device: Secondary | ICD-10-CM

## 2016-11-28 DIAGNOSIS — C9001 Multiple myeloma in remission: Secondary | ICD-10-CM

## 2016-11-28 DIAGNOSIS — Z79899 Other long term (current) drug therapy: Secondary | ICD-10-CM | POA: Insufficient documentation

## 2016-11-28 DIAGNOSIS — M79671 Pain in right foot: Secondary | ICD-10-CM

## 2016-11-28 MED ORDER — HEPARIN SOD (PORK) LOCK FLUSH 100 UNIT/ML IV SOLN
500.0000 [IU] | Freq: Once | INTRAVENOUS | Status: AC | PRN
  Administered 2016-11-28: 500 [IU] via INTRAVENOUS
  Filled 2016-11-28: qty 5

## 2016-11-28 MED ORDER — SODIUM CHLORIDE 0.9 % IJ SOLN
10.0000 mL | INTRAMUSCULAR | Status: DC | PRN
Start: 2016-11-28 — End: 2016-11-28
  Administered 2016-11-28: 10 mL via INTRAVENOUS
  Filled 2016-11-28: qty 10
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
## 2016-11-28 NOTE — ED Notes (Signed)
See EDP secondary assessment.  

## 2016-11-28 NOTE — ED Triage Notes (Signed)
Pt st's he has pain and swelling to right foot x's 4 to 5 months.  Pt denies injury st's it just won't get better and thought he needed to get it checked out.

## 2016-11-28 NOTE — Discharge Instructions (Signed)
Take Tylenol or ibuprofen as needed. Schedule appointment with podiatrist listed below. Return to ED for worsening pain, trouble walking, temperature change, numbness, weakness.

## 2016-12-05 MED FILL — OXYCODONE-ACETAMINOPHEN 10-: 10-325 | 30 days supply | Qty: 60 | Fill #0

## 2016-12-23 ENCOUNTER — Encounter: Payer: Medicare Other | Admitting: Podiatry

## 2016-12-29 ENCOUNTER — Emergency Department (HOSPITAL_COMMUNITY)
Admission: EM | Admit: 2016-12-29 | Discharge: 2016-12-29 | Disposition: A | Discharge status: Home / Self Care | Payer: Medicare Other | Attending: Emergency Medicine | Admitting: Emergency Medicine

## 2016-12-29 ENCOUNTER — Encounter (HOSPITAL_COMMUNITY): Payer: Self-pay

## 2016-12-29 DIAGNOSIS — Z8579 Personal history of other malignant neoplasms of lymphoid, hematopoietic and related tissues: Secondary | ICD-10-CM | POA: Diagnosis not present

## 2016-12-29 DIAGNOSIS — Z79899 Other long term (current) drug therapy: Secondary | ICD-10-CM | POA: Diagnosis not present

## 2016-12-29 DIAGNOSIS — R531 Weakness: Secondary | ICD-10-CM | POA: Diagnosis present

## 2016-12-29 DIAGNOSIS — Z87891 Personal history of nicotine dependence: Secondary | ICD-10-CM | POA: Diagnosis not present

## 2016-12-29 DIAGNOSIS — I10 Essential (primary) hypertension: Secondary | ICD-10-CM | POA: Insufficient documentation

## 2016-12-29 DIAGNOSIS — Z8546 Personal history of malignant neoplasm of prostate: Secondary | ICD-10-CM | POA: Diagnosis not present

## 2016-12-29 DIAGNOSIS — R5383 Other fatigue: Secondary | ICD-10-CM | POA: Diagnosis not present

## 2016-12-29 LAB — URINALYSIS, ROUTINE W REFLEX MICROSCOPIC
BILIRUBIN URINE: NEGATIVE
Glucose, UA: NEGATIVE mg/dL
Hgb urine dipstick: NEGATIVE
KETONES UR: NEGATIVE mg/dL
LEUKOCYTES UA: NEGATIVE
NITRITE: NEGATIVE
PH: 6 (ref 5.0–8.0)
PROTEIN: NEGATIVE mg/dL
Specific Gravity, Urine: 1.002 — ABNORMAL LOW (ref 1.005–1.030)

## 2016-12-29 LAB — CBC
HCT: 40.5 % (ref 39.0–52.0)
Hemoglobin: 13.4 g/dL (ref 13.0–17.0)
MCH: 31.7 pg (ref 26.0–34.0)
MCHC: 33.1 g/dL (ref 30.0–36.0)
MCV: 95.7 fL (ref 78.0–100.0)
PLATELETS: 196 10*3/uL (ref 150–400)
RBC: 4.23 MIL/uL (ref 4.22–5.81)
RDW: 12.7 % (ref 11.5–15.5)
WBC: 5 10*3/uL (ref 4.0–10.5)

## 2016-12-29 LAB — BASIC METABOLIC PANEL
Anion gap: 6 (ref 5–15)
BUN: 21 mg/dL — AB (ref 6–20)
CALCIUM: 9.3 mg/dL (ref 8.9–10.3)
CO2: 26 mmol/L (ref 22–32)
CREATININE: 1.04 mg/dL (ref 0.61–1.24)
Chloride: 102 mmol/L (ref 101–111)
GFR calc Af Amer: >60 mL/min (ref >60)
GLUCOSE: 97 mg/dL (ref 65–99)
POTASSIUM: 4.5 mmol/L (ref 3.5–5.1)
Sodium: 134 mmol/L — ABNORMAL LOW (ref 135–145)

## 2016-12-29 NOTE — ED Notes (Signed)
Declined W/C at D/C and was escorted to lobby by RN. 
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
## 2016-12-29 NOTE — ED Triage Notes (Signed)
Onset two weeks pt has had fatigue.  NO other symptoms.

## 2016-12-29 NOTE — Discharge Instructions (Signed)
Your blood pressure here was 157/87 here.  Please have rechecked this week.

## 2017-01-06 MED FILL — OXYCODONE-ACETAMINOPHEN 10-: 10-325 | 30 days supply | Qty: 60 | Fill #0

## 2017-01-13 ENCOUNTER — Ambulatory Visit (INDEPENDENT_AMBULATORY_CARE_PROVIDER_SITE_OTHER): Payer: Medicare Other

## 2017-01-13 ENCOUNTER — Encounter: Payer: Self-pay | Admitting: Podiatry

## 2017-01-13 ENCOUNTER — Ambulatory Visit (INDEPENDENT_AMBULATORY_CARE_PROVIDER_SITE_OTHER): Payer: Medicare Other | Admitting: Podiatry

## 2017-01-13 DIAGNOSIS — M2021 Hallux rigidus, right foot: Secondary | ICD-10-CM

## 2017-01-13 DIAGNOSIS — M79671 Pain in right foot: Secondary | ICD-10-CM

## 2017-01-13 NOTE — Patient Instructions (Signed)

## 2017-01-15 NOTE — Progress Notes (Signed)
   HPI: Skin is a 66 year old male presenting today for throbbing pain to the dorsum of the right foot and right great toe. He reports an associated nodule to the area. He states his symptoms have been intermittent for the past 6 months. Wearing tight shoes increases the pain. He denies any alleviating factors. He has not done anything to treat the symptoms. He is here for further evaluation and treatment.   Physical Exam: General: The patient is alert and oriented x3 in no acute distress.  Dermatology: Skin is warm, dry and supple bilateral lower extremities. Negative for open lesions or macerations.  Vascular: Palpable pedal pulses bilaterally. No edema or erythema noted. Capillary refill within normal limits.  Neurological: Epicritic and protective threshold grossly intact bilaterally.   Musculoskeletal Exam: Pain on palpation to the first MPJ of the right foot with minimal range of motion. Muscle strength 5/5 in all groups bilateral.   Radiographic Exam:  Severe DJD of the right first MPJ with dorsal bone spur.   Assessment: 1. First MPJ DJD/hallux rigidus right   Plan of Care:  1. Patient was evaluated. X-rays reviewed. 2. Today we discussed the conservative versus surgical management of the presenting pathology. The patient opts for surgical management. All possible complications and details of the procedure were explained. All patient questions were answered. No guarantees were expressed or implied. 3. Authorization for surgery was initiated today. Surgery will consist of right first MPJ arthrodesis. 4. Cam boot dispensed. 5. Return to clinic 1 week postop.   Edrick Kins, DPM [facility]  Dr. Edrick Kins, [address]                                        [place], [place] [address]                Office [phone]  Fax [phone]

## 2017-01-20 NOTE — Progress Notes (Signed)
This encounter was created in error - please disregard.

## 2017-01-30 ENCOUNTER — Ambulatory Visit (HOSPITAL_BASED_OUTPATIENT_CLINIC_OR_DEPARTMENT_OTHER): Payer: Medicare Other

## 2017-01-30 DIAGNOSIS — Z452 Encounter for adjustment and management of vascular access device: Secondary | ICD-10-CM

## 2017-01-30 DIAGNOSIS — C9001 Multiple myeloma in remission: Secondary | ICD-10-CM

## 2017-01-30 DIAGNOSIS — C61 Malignant neoplasm of prostate: Secondary | ICD-10-CM

## 2017-01-30 MED ORDER — HEPARIN SOD (PORK) LOCK FLUSH 100 UNIT/ML IV SOLN
500.0000 [IU] | Freq: Once | INTRAVENOUS | Status: AC | PRN
  Administered 2017-01-30: 500 [IU] via INTRAVENOUS
  Filled 2017-01-30: qty 5

## 2017-01-30 MED ORDER — SODIUM CHLORIDE 0.9 % IJ SOLN
10.0000 mL | INTRAMUSCULAR | Status: DC | PRN
  Administered 2017-01-30: 10 mL via INTRAVENOUS
  Filled 2017-01-30: qty 10

## 2017-01-30 NOTE — Patient Instructions (Signed)

## 2017-02-04 MED FILL — OXYCODONE-APAP 10-325 MG TA: 10-325 | 30 days supply | Qty: 60 | Fill #0

## 2017-02-06 ENCOUNTER — Encounter: Payer: Self-pay | Admitting: Podiatry

## 2017-02-06 DIAGNOSIS — M2011 Hallux valgus (acquired), right foot: Secondary | ICD-10-CM | POA: Diagnosis not present

## 2017-02-06 DIAGNOSIS — I10 Essential (primary) hypertension: Secondary | ICD-10-CM | POA: Diagnosis not present

## 2017-02-06 DIAGNOSIS — M25571 Pain in right ankle and joints of right foot: Secondary | ICD-10-CM | POA: Diagnosis not present

## 2017-02-06 DIAGNOSIS — M2021 Hallux rigidus, right foot: Secondary | ICD-10-CM | POA: Diagnosis not present

## 2017-02-06 MED FILL — MELOXICAM 15 MG TABLET: 15 | 60 days supply | Qty: 60 | Fill #0

## 2017-02-06 MED FILL — OXYCODONE-ACETAMINOPHEN 5-3: 5-325 | 5 days supply | Qty: 30 | Fill #0

## 2017-02-10 MED FILL — LOSARTAN POTASSIUM 100 MG T: 100 | 90 days supply | Qty: 90 | Fill #1

## 2017-02-12 ENCOUNTER — Ambulatory Visit (INDEPENDENT_AMBULATORY_CARE_PROVIDER_SITE_OTHER): Payer: Self-pay | Admitting: Podiatry

## 2017-02-12 ENCOUNTER — Ambulatory Visit (INDEPENDENT_AMBULATORY_CARE_PROVIDER_SITE_OTHER): Payer: Medicare Other

## 2017-02-12 VITALS — Temp 98.6°F

## 2017-02-12 DIAGNOSIS — Z9889 Other specified postprocedural states: Secondary | ICD-10-CM

## 2017-02-14 NOTE — Progress Notes (Signed)
   Subjective:  Patient presents today status post arthrodesis to the first MPJ right foot. Date of surgery 02/06/2017. Patient is doing well. He has noticed some swelling in the calf or 2 days after surgery however it is resolved. Patient denies any pain or symptoms today.    Objective/Physical Exam Skin incisions appear to be well coapted with sutures and staples intact. No sign of infectious process noted. No dehiscence. No active bleeding noted. Moderate edema noted to the surgical extremity.  Radiographic Exam:  Orthopedic hardware and osteotomies sites appear to be stable with routine healing.  Assessment: 1. s/p first MTPJ arthrodesis right foot. DOS: 02/06/2017   Plan of Care:  1. Patient was evaluated. X-rays reviewed 2. Today dressings were changed. Continue nonweightbearing in the immobilization cam boot 3. Return to clinic in 1 week for possible removal of stainless steel skin staples    Edrick Kins, DPM [facility]  Dr. Edrick Kins, [address]                                        [place], [place] [address]                Office [phone]  Fax [phone]

## 2017-02-19 ENCOUNTER — Ambulatory Visit (INDEPENDENT_AMBULATORY_CARE_PROVIDER_SITE_OTHER): Payer: Medicare Other | Admitting: Podiatry

## 2017-02-19 DIAGNOSIS — Z9889 Other specified postprocedural states: Secondary | ICD-10-CM

## 2017-02-22 NOTE — Progress Notes (Signed)
   Subjective:  Patient presents today status post arthrodesis to the first MPJ right foot. Date of surgery 02/06/2017. Patient is doing well. Patient denies any significant pain relating to his right foot   Objective/Physical Exam Skin incisions appear to be well coapted with sutures and staples intact. No sign of infectious process noted. No dehiscence. No active bleeding noted. Minimal edema noted to the surgical extremity.  Radiographic Exam:  Orthopedic hardware and osteotomies sites appear to be stable with routine healing.  Assessment: 1. s/p first MTPJ arthrodesis right foot. DOS: 02/06/2017   Plan of Care:  1. Patient was evaluated. 2. Today the stainless steel skin staples were removed 3. Patient can begin getting the foot wet. 4. Continued immobilization cam boot and nonweightbearing 5. Return to clinic in 3 weeks   Edrick Kins, DPM [facility]  Dr. Edrick Kins, [address] [place]                                        [place], [place] [address]                Office [phone]  Fax [phone]

## 2017-03-03 MED FILL — OXYCODONE-APAP 10-325 MG TA: 10-325 | 30 days supply | Qty: 60 | Fill #0

## 2017-03-12 ENCOUNTER — Ambulatory Visit (INDEPENDENT_AMBULATORY_CARE_PROVIDER_SITE_OTHER): Payer: Medicare Other

## 2017-03-12 ENCOUNTER — Ambulatory Visit (INDEPENDENT_AMBULATORY_CARE_PROVIDER_SITE_OTHER): Payer: Self-pay | Admitting: Podiatry

## 2017-03-12 DIAGNOSIS — M2011 Hallux valgus (acquired), right foot: Secondary | ICD-10-CM

## 2017-03-12 DIAGNOSIS — Z9889 Other specified postprocedural states: Secondary | ICD-10-CM

## 2017-03-12 NOTE — Progress Notes (Signed)
   Subjective:  Patient presents today status post arthrodesis to the first MPJ right foot. Date of surgery 02/06/2017. Patient is doing well. Patient denies any significant pain relating to his right foot. Patient is excited to begin ambulating on his foot   Objective/Physical Exam Skin incisions appear to be well coapted. Minimal edema noted. No hypertrophic scar formation.  Radiographic Exam:  Orthopedic hardware and osteotomies sites appear to be stable with routine healing. It appears as though the arthrodesis site is mostly healed and fused.  Assessment: 1. s/p first MTPJ arthrodesis right foot. DOS: 02/06/2017   Plan of Care:  1. Patient was evaluated. 2. Patient can begin weightbearing and immobilization cam boot 2 weeks. After 2 weeks he can transition into good supportive sneakers. Continue wearing compression ankle sleeve 3. Return to clinic in 6 weeks  Edrick Kins, DPM [facility]  Dr. Edrick Kins, [address] [place]                                        [place], [place] [address]                Office [phone]  Fax [phone]

## 2017-03-12 NOTE — Patient Instructions (Signed)
Discontinue Boot on Sept 12, 2018, into good sneakers.

## 2017-03-18 ENCOUNTER — Emergency Department (HOSPITAL_COMMUNITY)
Admission: EM | Admit: 2017-03-18 | Discharge: 2017-03-18 | Disposition: A | Discharge status: Home / Self Care | Payer: Medicare Other | Attending: Emergency Medicine | Admitting: Emergency Medicine

## 2017-03-18 ENCOUNTER — Encounter (HOSPITAL_COMMUNITY): Payer: Self-pay | Admitting: Emergency Medicine

## 2017-03-18 DIAGNOSIS — M79604 Pain in right leg: Secondary | ICD-10-CM | POA: Insufficient documentation

## 2017-03-18 DIAGNOSIS — Z5321 Procedure and treatment not carried out due to patient leaving prior to being seen by health care provider: Secondary | ICD-10-CM | POA: Insufficient documentation

## 2017-03-18 NOTE — ED Triage Notes (Signed)
Pt. Stated, I had leg surgery 6 weeks ago and im still having swelling and pain in my right leg.

## 2017-03-19 ENCOUNTER — Emergency Department (HOSPITAL_COMMUNITY)
Admission: EM | Admit: 2017-03-19 | Discharge: 2017-03-19 | Disposition: A | Discharge status: Home / Self Care | Payer: Medicare Other | Attending: Emergency Medicine | Admitting: Emergency Medicine

## 2017-03-19 ENCOUNTER — Encounter (HOSPITAL_COMMUNITY): Payer: Self-pay | Admitting: Emergency Medicine

## 2017-03-19 ENCOUNTER — Emergency Department (HOSPITAL_BASED_OUTPATIENT_CLINIC_OR_DEPARTMENT_OTHER)
Admit: 2017-03-19 | Discharge: 2017-03-19 | Disposition: A | Discharge status: Home / Self Care | Payer: Medicare Other | Attending: Emergency Medicine | Admitting: Emergency Medicine

## 2017-03-19 DIAGNOSIS — I82431 Acute embolism and thrombosis of right popliteal vein: Secondary | ICD-10-CM | POA: Insufficient documentation

## 2017-03-19 DIAGNOSIS — Z79899 Other long term (current) drug therapy: Secondary | ICD-10-CM | POA: Insufficient documentation

## 2017-03-19 DIAGNOSIS — Z9889 Other specified postprocedural states: Secondary | ICD-10-CM | POA: Diagnosis not present

## 2017-03-19 DIAGNOSIS — Z87891 Personal history of nicotine dependence: Secondary | ICD-10-CM | POA: Insufficient documentation

## 2017-03-19 DIAGNOSIS — M7989 Other specified soft tissue disorders: Secondary | ICD-10-CM | POA: Diagnosis not present

## 2017-03-19 DIAGNOSIS — Z8546 Personal history of malignant neoplasm of prostate: Secondary | ICD-10-CM | POA: Insufficient documentation

## 2017-03-19 DIAGNOSIS — Z8579 Personal history of other malignant neoplasms of lymphoid, hematopoietic and related tissues: Secondary | ICD-10-CM | POA: Insufficient documentation

## 2017-03-19 DIAGNOSIS — I10 Essential (primary) hypertension: Secondary | ICD-10-CM | POA: Insufficient documentation

## 2017-03-19 DIAGNOSIS — R2241 Localized swelling, mass and lump, right lower limb: Secondary | ICD-10-CM | POA: Diagnosis present

## 2017-03-19 LAB — I-STAT CHEM 8, ED
BUN: 15 mg/dL (ref 6–20)
Calcium, Ion: 1.2 mmol/L (ref 1.15–1.40)
Chloride: 104 mmol/L (ref 101–111)
Creatinine, Ser: 0.8 mg/dL (ref 0.61–1.24)
Glucose, Bld: 85 mg/dL (ref 65–99)
HCT: 43 % (ref 39.0–52.0)
Hemoglobin: 14.6 g/dL (ref 13.0–17.0)
Potassium: 4.3 mmol/L (ref 3.5–5.1)
Sodium: 139 mmol/L (ref 135–145)
TCO2: 27 mmol/L (ref 22–32)

## 2017-03-19 LAB — CBC
HCT: 40.2 % (ref 39.0–52.0)
Hemoglobin: 13.5 g/dL (ref 13.0–17.0)
MCH: 31 pg (ref 26.0–34.0)
MCHC: 33.6 g/dL (ref 30.0–36.0)
MCV: 92.4 fL (ref 78.0–100.0)
Platelets: 193 10*3/uL (ref 150–400)
RBC: 4.35 MIL/uL (ref 4.22–5.81)
RDW: 12.7 % (ref 11.5–15.5)
WBC: 4.5 10*3/uL (ref 4.0–10.5)

## 2017-03-19 MED ORDER — RIVAROXABAN (XARELTO) VTE STARTER PACK (15 & 20 MG): ORAL_TABLET | ORAL | 0 refills | Status: DC

## 2017-03-19 MED ORDER — HYDROMORPHONE HCL 1 MG/ML IJ SOLN
1.0000 mg | Freq: Once | INTRAMUSCULAR | Status: AC
  Administered 2017-03-19: 1 mg via INTRAMUSCULAR
  Filled 2017-03-19: qty 1

## 2017-03-19 MED ORDER — RIVAROXABAN 15 MG PO TABS
15.0000 mg | ORAL_TABLET | Freq: Once | ORAL | Status: AC
  Administered 2017-03-19: 15 mg via ORAL
  Filled 2017-03-19: qty 1

## 2017-03-19 MED FILL — XARELTO STARTER PACK: 15 & 20 | 30 days supply | Qty: 51 | Fill #0
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
## 2017-03-19 NOTE — ED Notes (Signed)
Vascular Tech at bedside.  

## 2017-03-19 NOTE — Progress Notes (Signed)
*  PRELIMINARY RESULTS* Vascular Ultrasound Right lower extremity venous duplex has been completed.  Preliminary findings: Acute deep vein thrombosis seen in the right distal popliteal vein extending into the peroneal veins of the right calf, visualized portions of the posterior tibial veins appear patent. Small segment of superficial vein thrombosis in the greater saphenous vein of the right calf.  Negative for bakers cyst on the right.  Preliminary results given to Dr. Ellender Hose @ 8:40.  Everrett Coombe 03/19/2017, 8:42 AM

## 2017-03-19 NOTE — ED Triage Notes (Signed)
Pt c/o swelling and pain in right leg onset 6 weeks ago after surgery. 2+ pitting edema to right leg, right leg larger than left. No SOB or CP.

## 2017-03-19 NOTE — Discharge Instructions (Signed)
Start taking this medication immediately.  Call [name] to discuss your ER visit, and to set up an appointment for follow-up. You will need to take this blood thinner until your doctor or [name] tells you it is safe to stop.  Because you are on a blood thinner, take care to seek immediate medical attention with any injury or trauma to the head, or with any serious cuts.

## 2017-03-26 ENCOUNTER — Telehealth: Payer: Self-pay | Admitting: Oncology

## 2017-03-26 NOTE — Telephone Encounter (Signed)
R/s appt per patient request. - patient is aware of changes - printed calender for patient .

## 2017-03-27 ENCOUNTER — Other Ambulatory Visit: Payer: Medicare Other

## 2017-03-27 ENCOUNTER — Ambulatory Visit (HOSPITAL_BASED_OUTPATIENT_CLINIC_OR_DEPARTMENT_OTHER): Payer: Medicare Other

## 2017-03-27 ENCOUNTER — Other Ambulatory Visit (HOSPITAL_BASED_OUTPATIENT_CLINIC_OR_DEPARTMENT_OTHER): Payer: Medicare Other

## 2017-03-27 DIAGNOSIS — C9001 Multiple myeloma in remission: Secondary | ICD-10-CM

## 2017-03-27 DIAGNOSIS — Z452 Encounter for adjustment and management of vascular access device: Secondary | ICD-10-CM | POA: Diagnosis not present

## 2017-03-27 DIAGNOSIS — C61 Malignant neoplasm of prostate: Secondary | ICD-10-CM

## 2017-03-27 LAB — CBC WITH DIFFERENTIAL/PLATELET
BASO%: 0.3 % (ref 0.0–2.0)
Basophils Absolute: 0 10*3/uL (ref 0.0–0.1)
EOS%: 1 % (ref 0.0–7.0)
Eosinophils Absolute: 0 10*3/uL (ref 0.0–0.5)
HCT: 39.4 % (ref 38.4–49.9)
HGB: 13.2 g/dL (ref 13.0–17.1)
LYMPH#: 1.8 10*3/uL (ref 0.9–3.3)
LYMPH%: 45.2 % (ref 14.0–49.0)
MCH: 31.7 pg (ref 27.2–33.4)
MCHC: 33.5 g/dL (ref 32.0–36.0)
MCV: 94.5 fL (ref 79.3–98.0)
MONO#: 0.3 10*3/uL (ref 0.1–0.9)
MONO%: 8.1 % (ref 0.0–14.0)
NEUT#: 1.8 10*3/uL (ref 1.5–6.5)
NEUT%: 45.4 % (ref 39.0–75.0)
NRBC: 0 % (ref 0–0)
PLATELETS: 177 10*3/uL (ref 140–400)
RBC: 4.17 10*6/uL — AB (ref 4.20–5.82)
RDW: 12.7 % (ref 11.0–14.6)
WBC: 3.9 10*3/uL — ABNORMAL LOW (ref 4.0–10.3)

## 2017-03-27 LAB — COMPREHENSIVE METABOLIC PANEL
ALK PHOS: 79 U/L (ref 40–150)
ALT: 26 U/L (ref 0–55)
ANION GAP: 9 meq/L (ref 3–11)
AST: 25 U/L (ref 5–34)
Albumin: 3.9 g/dL (ref 3.5–5.0)
BILIRUBIN TOTAL: 0.31 mg/dL (ref 0.20–1.20)
BUN: 23.7 mg/dL (ref 7.0–26.0)
CO2: 24 meq/L (ref 22–29)
Calcium: 9.9 mg/dL (ref 8.4–10.4)
Chloride: 104 mEq/L (ref 98–109)
Creatinine: 1.2 mg/dL (ref 0.7–1.3)
EGFR: 77 mL/min/{1.73_m2} — AB (ref >90)
Glucose: 92 mg/dl (ref 70–140)
Potassium: 4.3 mEq/L (ref 3.5–5.1)
Sodium: 138 mEq/L (ref 136–145)
TOTAL PROTEIN: 7.5 g/dL (ref 6.4–8.3)

## 2017-03-27 MED ORDER — HEPARIN SOD (PORK) LOCK FLUSH 100 UNIT/ML IV SOLN
500.0000 [IU] | Freq: Once | INTRAVENOUS | Status: AC | PRN
  Administered 2017-03-27: 500 [IU] via INTRAVENOUS
  Filled 2017-03-27: qty 5

## 2017-03-27 MED ORDER — SODIUM CHLORIDE 0.9 % IJ SOLN
10.0000 mL | INTRAMUSCULAR | Status: DC | PRN
  Administered 2017-03-27: 10 mL via INTRAVENOUS
  Filled 2017-03-27: qty 10

## 2017-03-28 LAB — KAPPA/LAMBDA LIGHT CHAINS
Ig Kappa Free Light Chain: 19.3 mg/L (ref 3.3–19.4)
Ig Lambda Free Light Chain: 13.7 mg/L (ref 5.7–26.3)
Kappa/Lambda FluidC Ratio: 1.41 (ref 0.26–1.65)

## 2017-03-31 ENCOUNTER — Other Ambulatory Visit: Payer: Medicare Other

## 2017-03-31 LAB — MULTIPLE MYELOMA PANEL, SERUM
ALBUMIN/GLOB SERPL: 1.1 (ref 0.7–1.7)
ALPHA 1: 0.2 g/dL (ref 0.0–0.4)
ALPHA2 GLOB SERPL ELPH-MCNC: 0.9 g/dL (ref 0.4–1.0)
Albumin SerPl Elph-Mcnc: 3.6 g/dL (ref 2.9–4.4)
B-GLOBULIN SERPL ELPH-MCNC: 1 g/dL (ref 0.7–1.3)
GAMMA GLOB SERPL ELPH-MCNC: 1.2 g/dL (ref 0.4–1.8)
GLOBULIN, TOTAL: 3.3 g/dL (ref 2.2–3.9)
IgA, Qn, Serum: 195 mg/dL (ref 61–437)
IgG, Qn, Serum: 1091 mg/dL (ref 700–1600)
IgM, Qn, Serum: 116 mg/dL (ref 20–172)
Total Protein: 6.9 g/dL (ref 6.0–8.5)

## 2017-04-03 ENCOUNTER — Telehealth: Payer: Self-pay | Admitting: Oncology

## 2017-04-03 ENCOUNTER — Ambulatory Visit (HOSPITAL_BASED_OUTPATIENT_CLINIC_OR_DEPARTMENT_OTHER): Payer: Medicare Other | Admitting: Oncology

## 2017-04-03 VITALS — BP 127/78 | HR 66 | Temp 99.2°F | Resp 18 | Ht 73.0 in | Wt 159.4 lb

## 2017-04-03 DIAGNOSIS — Z86718 Personal history of other venous thrombosis and embolism: Secondary | ICD-10-CM

## 2017-04-03 DIAGNOSIS — Z7901 Long term (current) use of anticoagulants: Secondary | ICD-10-CM | POA: Diagnosis not present

## 2017-04-03 DIAGNOSIS — C9001 Multiple myeloma in remission: Secondary | ICD-10-CM

## 2017-04-03 DIAGNOSIS — Z8546 Personal history of malignant neoplasm of prostate: Secondary | ICD-10-CM

## 2017-04-03 DIAGNOSIS — C61 Malignant neoplasm of prostate: Secondary | ICD-10-CM

## 2017-04-03 MED ORDER — RIVAROXABAN 20 MG PO TABS: 20.0000 mg | ORAL_TABLET | Freq: Every day | ORAL | 0 refills | Status: DC

## 2017-04-03 MED FILL — XARELTO 20 MG TABLET: 20 | 30 days supply | Qty: 30 | Fill #0

## 2017-04-03 NOTE — Progress Notes (Signed)
Hematology and Oncology Follow Up Visit  Travis Nguyen [id number] 1950/11/06 66 y.o. 04/03/2017 8:17 AM Travis Nguyen, Travis Brow, MD   Principle Diagnosis: 66 year old gentleman with the following diagnoses:  1. 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 Initially at smoldering subtype of 11% plasma cell in the bone marrow. He subsequently developed active multiple myeloma in February of 2014 with a 23% plasma cell infiltration. 2. Prostate cancer diagnosed in May of 2014. He presented with an elevated PSA of 13.2 stage TI C. clinical staging. His Gleason score was 3+4 equals 7. 3. Deep vein thrombosis diagnosed in September 2018. This was provoked after orthopedic surgery in July 2018. Ultrasound Doppler showed subacute deep vein thrombosis in the distal popliteal and peroneal vein of the lower extremities.  Prior Therapy: He was treated initially with Cytoxan, Velcade and Decadron and subsequently his regimen changed to a Velcade, Revlimid with dexamethasone. He had an excellent response with that an M spike that is no longer detected an IgG level within normal range at April 2015. He did not have any poor prognostic features on his cytogenetics.   He is status post a robotic-assisted laparoscopic radical prostatectomy and bilateral lymph node dissection on 02/09/2014. The final pathology showed prostate adenocarcinoma Gleason score 4+3 equals 7 involving both lobes. Extraprostatic extension was present. The bladder neck margin was negative. Zero out of two lymph nodes were involved.  Current therapy:  Xarelto started on 03/20/2017 2018. He is currently taking 15 mg twice day and will do so for the next week or so. He will starting and 20 mg daily after that.  Interim History:  Travis Nguyen presents today for a followup visit. Since the last visit, he underwent surgery on his right foot and was in a walking boot for several weeks after his operation in July 2018. He  developed right lower shin material edema that has persisted even after his walking boot as were removed. He was seen in the emergency department on 03/19/2017 and was found to have deep vein thrombosis involving his right lower extremity. His popliteal and peroneal vein were involved. He was started on Xarelto which she is already taking at this time.  He tolerated this medication reasonably well without any bleeding complaints. His edema has resolved. His appetite and performance status remains excellent. He denied any pathological fractures or bone pain. He denied any recurrent infections.  He did not report any fevers or chills or sweats. Does not report any hematuria or dysuria. Does not report any headaches or blurry vision or double vision. Does not report any syncope or seizures. He does not report any skeletal complaints of arthralgias or myalgias. He does not report any chest pain, shortness of breath, palpitation orthopnea or PND. He does not report any shortness of breath or wheezing. His performance status and activity level remains excellent. Rest of his review of systems unremarkable.  Medications: I have reviewed the patient's current medications.  Current Outpatient Prescriptions  Medication Sig Dispense Refill  . acetaminophen (TYLENOL) 500 MG tablet Take 500-1,000 mg by mouth every 6 (six) hours as needed (For pain.). Reported on 09/19/2015    . lidocaine-prilocaine (EMLA) cream APPLY TOPICALLY TO PORT-A-CATH DAILY AS NEEDED (Patient taking differently: APPLY TOPICALLY TO PORT-A-CATH DAILY AS NEEDED PORT ACCESS.) 30 g 1  . losartan (COZAAR) 100 MG tablet Take 100 mg by mouth every morning.     . Multiple Vitamin (MULTIVITAMIN WITH MINERALS) TABS tablet Take 1 tablet by mouth daily.    [name]  oxyCODONE-acetaminophen (PERCOCET) 10-325 MG tablet Take 1 tablet by mouth every 6 (six) hours as needed for pain.     . polyethylene glycol (MIRALAX / GLYCOLAX) packet Take 17 g by mouth daily as  needed for mild constipation. (Patient not taking: Reported on 03/19/2017) 14 each 0  . rivaroxaban (XARELTO) 20 MG TABS tablet Take 1 tablet (20 mg total) by mouth daily with supper. 60 tablet 0  . Rivaroxaban 15 & 20 MG TBPK Take as directed on package: Start with one 42m tablet by mouth twice a day with food. On Day 22, switch to one 223mtablet once a day with food. 51 each 0   No current facility-administered medications for this visit.      Allergies:  Allergies  Allergen Reactions  . Aspirin     Pt stated "raises BP"     Past Medical History, Surgical history, Social history, and Family History were reviewed and updated.   Physical Exam: Blood pressure 127/78, pulse 66, temperature 99.2 F (37.3 C), temperature source Oral, resp. rate 18, height 6' 1"  (1.854 m), weight 159 lb 6.4 oz (72.3 kg), SpO2 100 %. ECOG: 1 General appearance: Well-appearing gentleman without distress. Head: Normocephalic, without obvious abnormality no oral ulcers or lesions. Neck: no adenopathy Lymph nodes: Cervical, supraclavicular, and axillary nodes normal. Heart:regular rate and rhythm, S1, S2 normal, no murmur, click, rub or gallop Lung:chest clear, no wheezing, rales, normal symmetric air entry Abdomin: soft, non-tender, without masses or organomegaly no rebound or guarding. EXT:no erythema, induration, or nodules Neurological exam showed no deficits or lesions.  Lab Results: Lab Results  Component Value Date   WBC 3.9 (L) 03/27/2017   HGB 13.2 03/27/2017   HCT 39.4 03/27/2017   MCV 94.5 03/27/2017   PLT 177 03/27/2017     Chemistry      Component Value Date/Time   NA 138 03/27/2017 0838   K 4.3 03/27/2017 0838   CL 104 03/19/2017 0913   CL 105 01/01/2013 0835   CO2 24 03/27/2017 0838   BUN 23.7 03/27/2017 0838   CREATININE 1.2 03/27/2017 0838      Component Value Date/Time   CALCIUM 9.9 03/27/2017 0838   ALKPHOS 79 03/27/2017 0838   AST 25 03/27/2017 0838   ALT 26  03/27/2017 0838   BILITOT 0.31 03/27/2017 0838       Results for Travis Nguyen, [name] [id number] of 04/03/2017 07:38  Ref. Range 03/21/2016 08:53 09/19/2016 08:33 03/27/2017 08:38  M Protein SerPl Elph-Mcnc Latest Ref Range: Not Observed g/dL Not Observed Not Observed Not Observed  Results for WIDAQUAN, CRAPPSMRN [id number] of 04/03/2017 07:38  Ref. Range 03/21/2016 08:53 09/19/2016 08:33 03/27/2017 08:38  IgG (Immunoglobin G), Serum Latest Ref Range: 700 - 1600 mg/dL 1,057 1,078 1,091     Impression and Plan:  [age]ear old gentleman with the following issues  1.IgG Kappa Multiple Myeloma: He is status post definitive therapy with Cytoxan, Velcade and dexamethasone and achieved complete response in April 2015. He continues to be on active surveillance after refusing autologous stem cell transplant.  His protein studies obtained on 03/27/2017 were reviewed today and continue to show complete response. He has no M spike noted with normal quantitative immunoglobulins and serum light chains.  No need for any treatment at this time and we will continue observation surveillance. We will repeat protein studies in 6 months.   2. Prostate cancer: He is status post prostatectomy on 02/09/2014. His pathology showed a Gleason score  4+3 equals 7 with disease involving both lobes. He has extraprostatic extension but no lymph node involvement.   He continues to be on active surveillance under the care of urology. His PSA remains undetectable in March 2018.  3. Port-A-Cath flush: This will be arranged every 2 months. He elected to keep the Port-A-Cath because of his poor venous access.  4. Small bowel obstruction: No recent issues related to that.  5. Deep vein thrombosis: He presented with a right lower extremity edema and found to have a popliteal vein thrombosis in December 2018. This appears to be provoked after orthopedic surgery in July 2018. The plan is to complete full dose  anticoagulation with Xarelto for total of 3 months. He will come off anticoagulation in December 2018. Normal anticoagulation as needed if his risk factors persist.  6. Followup: Will be in 3 months.    Zola Button, MD 9/20/20188:17 AM

## 2017-04-03 NOTE — Telephone Encounter (Signed)
Gave avs and calendar for december °

## 2017-04-04 ENCOUNTER — Telehealth: Payer: Self-pay | Admitting: Oncology

## 2017-04-04 NOTE — Telephone Encounter (Signed)
Spoke with patient's wife regarding his flush appts per 9/20 sch msg.

## 2017-04-07 MED FILL — OXYCODONE-APAP 10-325 MG TA: 10-325 | 30 days supply | Qty: 60 | Fill #0

## 2017-04-21 NOTE — Progress Notes (Signed)
DOS 02/06/17 1st MPJ arthrodesis Rt foot

## 2017-04-23 ENCOUNTER — Ambulatory Visit (INDEPENDENT_AMBULATORY_CARE_PROVIDER_SITE_OTHER): Payer: Medicare Other

## 2017-04-23 ENCOUNTER — Ambulatory Visit (INDEPENDENT_AMBULATORY_CARE_PROVIDER_SITE_OTHER): Payer: Medicare Other | Admitting: Podiatry

## 2017-04-23 ENCOUNTER — Telehealth: Payer: Self-pay | Admitting: *Deleted

## 2017-04-23 VITALS — BP 154/93 | HR 76

## 2017-04-23 DIAGNOSIS — Z9889 Other specified postprocedural states: Secondary | ICD-10-CM | POA: Diagnosis not present

## 2017-04-23 DIAGNOSIS — M2011 Hallux valgus (acquired), right foot: Secondary | ICD-10-CM

## 2017-04-23 MED ORDER — DICLOFENAC SODIUM 75 MG PO TBEC: 75.0000 mg | DELAYED_RELEASE_TABLET | Freq: Two times a day (BID) | ORAL | 0 refills | Status: DC

## 2017-04-23 MED ORDER — HYDROCODONE-ACETAMINOPHEN 10-325 MG PO TABS: 1.0000 | ORAL_TABLET | Freq: Three times a day (TID) | ORAL | 0 refills | Status: DC | PRN

## 2017-04-23 MED FILL — HYDROCODON-APAP 10-325: 10-325 | 10 days supply | Qty: 30 | Fill #0

## 2017-04-23 NOTE — Telephone Encounter (Signed)
[place] states pt was prescribed diclofenac and is on xarelto. Dr. Amalia Hailey states discontinue the diclofenac, and I informed Monica.

## 2017-04-29 NOTE — Progress Notes (Signed)
   Subjective:  Patient presents today status post arthrodesis to the first MPJ right foot. Date of surgery 02/06/2017. He states his pain has worsened. He now reports aching in the right foot, especially when air hits it. He reports some continued associated swelling. Patient was seen in the ED on 03/19/17 and treated for a DVT to the RLE with Xarelto.    Past Medical History:  Diagnosis Date  . Anemia    hx of   . Arthritis    DJD lower back  . Back pain   . Gall stones    hx of  . Heart murmur    asymptomatic   . Hypertension   . Melanoma ([place])    does not have melanoma!!! (per pt)  . Multiple myeloma ([place]) dx'd 10/2009   chemo  . Pneumonia    x1  . Prostate cancer ([place]) 11/2012   gleason 3+4=7, volume 24 gm  . Sinus problem       Objective/Physical Exam Skin incisions appear to be well coapted. Minimal edema noted. No hypertrophic scar formation.  Radiographic Exam:  Orthopedic hardware and osteotomies sites appear to be stable with routine healing.   Assessment: 1. s/p first MTPJ arthrodesis right foot. DOS: 02/06/2017   Plan of Care:  1. Patient was evaluated.X-rays reviewed. 2. Prescription for diclofenac given to patient. 3. Prescription for Vicodin given to patient. 4. Continue wearing normal shoe gear. 5. Return to clinic in 3 months.  Edrick Kins, DPM [facility]  Dr. Edrick Kins, [address]                                        [place], [place] [address]                Office [phone]  Fax [phone]

## 2017-05-05 MED FILL — OXYCODONE-APAP 10-325 MG TA: 10-325 | 30 days supply | Qty: 60 | Fill #0

## 2017-05-19 MED FILL — LOSARTAN POTASSIUM 100 MG T: 100 | 90 days supply | Qty: 90 | Fill #0

## 2017-05-19 MED FILL — XARELTO 20 MG TABLET: 20 | 30 days supply | Qty: 30 | Fill #1

## 2017-06-02 ENCOUNTER — Ambulatory Visit (HOSPITAL_BASED_OUTPATIENT_CLINIC_OR_DEPARTMENT_OTHER): Payer: Medicare Other

## 2017-06-02 DIAGNOSIS — C61 Malignant neoplasm of prostate: Secondary | ICD-10-CM

## 2017-06-02 DIAGNOSIS — C9001 Multiple myeloma in remission: Secondary | ICD-10-CM | POA: Diagnosis not present

## 2017-06-02 DIAGNOSIS — Z452 Encounter for adjustment and management of vascular access device: Secondary | ICD-10-CM

## 2017-06-02 MED ORDER — HEPARIN SOD (PORK) LOCK FLUSH 100 UNIT/ML IV SOLN
500.0000 [IU] | Freq: Once | INTRAVENOUS | Status: AC | PRN
  Administered 2017-06-02: 500 [IU] via INTRAVENOUS
  Filled 2017-06-02: qty 5

## 2017-06-02 MED ORDER — SODIUM CHLORIDE 0.9 % IJ SOLN
10.0000 mL | INTRAMUSCULAR | Status: DC | PRN
  Administered 2017-06-02: 10 mL via INTRAVENOUS
  Filled 2017-06-02: qty 10

## 2017-06-02 NOTE — Patient Instructions (Signed)
Implanted Port Home Guide An implanted port is a type of central line that is placed under the skin. Central lines are used to provide IV access when treatment or nutrition needs to be given through a person's veins. Implanted ports are used for long-term IV access. An implanted port may be placed because:  You need IV medicine that would be irritating to the small veins in your hands or arms.  You need long-term IV medicines, such as antibiotics.  You need IV nutrition for a long period.  You need frequent blood draws for lab tests.  You need dialysis.  Implanted ports are usually placed in the chest area, but they can also be placed in the upper arm, the abdomen, or the leg. An implanted port has two main parts:  Reservoir. The reservoir is round and will appear as a small, raised area under your skin. The reservoir is the part where a needle is inserted to give medicines or draw blood.  Catheter. The catheter is a thin, flexible tube that extends from the reservoir. The catheter is placed into a large vein. Medicine that is inserted into the reservoir goes into the catheter and then into the vein.  How will I care for my incision site? Do not get the incision site wet. Bathe or shower as directed by your health care provider. How is my port accessed? Special steps must be taken to access the port:  Before the port is accessed, a numbing cream can be placed on the skin. This helps numb the skin over the port site.  Your health care provider uses a sterile technique to access the port. ? Your health care provider must put on a mask and sterile gloves. ? The skin over your port is cleaned carefully with an antiseptic and allowed to dry. ? The port is gently pinched between sterile gloves, and a needle is inserted into the port.  Only "non-coring" port needles should be used to access the port. Once the port is accessed, a blood return should be checked. This helps ensure that the port  is in the vein and is not clogged.  If your port needs to remain accessed for a constant infusion, a clear (transparent) bandage will be placed over the needle site. The bandage and needle will need to be changed every week, or as directed by your health care provider.  Keep the bandage covering the needle clean and dry. Do not get it wet. Follow your health care provider's instructions on how to take a shower or bath while the port is accessed.  If your port does not need to stay accessed, no bandage is needed over the port.  What is flushing? Flushing helps keep the port from getting clogged. Follow your health care provider's instructions on how and when to flush the port. Ports are usually flushed with saline solution or a medicine called heparin. The need for flushing will depend on how the port is used.  If the port is used for intermittent medicines or blood draws, the port will need to be flushed: ? After medicines have been given. ? After blood has been drawn. ? As part of routine maintenance.  If a constant infusion is running, the port may not need to be flushed.  How long will my port stay implanted? The port can stay in for as long as your health care provider thinks it is needed. When it is time for the port to come out, surgery will be   done to remove it. The procedure is similar to the one performed when the port was put in. When should I seek immediate medical care? When you have an implanted port, you should seek immediate medical care if:  You notice a bad smell coming from the incision site.  You have swelling, redness, or drainage at the incision site.  You have more swelling or pain at the port site or the surrounding area.  You have a fever that is not controlled with medicine.  This information is not intended to replace advice given to you by your health care provider. Make sure you discuss any questions you have with your health care provider. Document  Released: 07/01/2005 Document Revised: 12/07/2015 Document Reviewed: 03/08/2013 Elsevier Interactive Patient Education  2017 [employer]  

## 2017-06-03 MED FILL — OXYCODONE-APAP 10-325 MG TA: 10-325 | 30 days supply | Qty: 60 | Fill #0

## 2017-06-17 ENCOUNTER — Other Ambulatory Visit: Payer: Self-pay | Admitting: Oncology

## 2017-06-17 ENCOUNTER — Telehealth: Payer: Self-pay

## 2017-06-17 MED ORDER — RIVAROXABAN 20 MG PO TABS: 20.0000 mg | ORAL_TABLET | Freq: Every day | ORAL | 0 refills | Status: DC

## 2017-06-17 MED FILL — XARELTO 20 MG TABLET: 20 | 30 days supply | Qty: 30 | Fill #0

## 2017-06-17 NOTE — Telephone Encounter (Signed)
Pt came requesting xarelto refill. prescribed 30 days worth per ov note 04/03/17. Plan is to DC after 3 months which will be about 07/03/17. Discussed next ov 12/20 9 am with pt.

## 2017-06-21 ENCOUNTER — Other Ambulatory Visit: Payer: Self-pay | Admitting: Nurse Practitioner

## 2017-06-30 ENCOUNTER — Emergency Department (HOSPITAL_COMMUNITY): Payer: Medicare Other

## 2017-06-30 ENCOUNTER — Encounter (HOSPITAL_COMMUNITY): Payer: Self-pay | Admitting: Nurse Practitioner

## 2017-06-30 ENCOUNTER — Other Ambulatory Visit: Payer: Self-pay

## 2017-06-30 ENCOUNTER — Emergency Department (HOSPITAL_COMMUNITY)
Admission: EM | Admit: 2017-06-30 | Discharge: 2017-06-30 | Disposition: A | Discharge status: Home / Self Care | Payer: Medicare Other | Attending: Emergency Medicine | Admitting: Emergency Medicine

## 2017-06-30 DIAGNOSIS — Z8546 Personal history of malignant neoplasm of prostate: Secondary | ICD-10-CM | POA: Diagnosis not present

## 2017-06-30 DIAGNOSIS — N3001 Acute cystitis with hematuria: Secondary | ICD-10-CM | POA: Insufficient documentation

## 2017-06-30 DIAGNOSIS — Z85820 Personal history of malignant melanoma of skin: Secondary | ICD-10-CM | POA: Insufficient documentation

## 2017-06-30 DIAGNOSIS — Z87891 Personal history of nicotine dependence: Secondary | ICD-10-CM | POA: Diagnosis not present

## 2017-06-30 DIAGNOSIS — I10 Essential (primary) hypertension: Secondary | ICD-10-CM | POA: Insufficient documentation

## 2017-06-30 DIAGNOSIS — Z79899 Other long term (current) drug therapy: Secondary | ICD-10-CM | POA: Diagnosis not present

## 2017-06-30 DIAGNOSIS — R319 Hematuria, unspecified: Secondary | ICD-10-CM | POA: Diagnosis present

## 2017-06-30 DIAGNOSIS — Z7901 Long term (current) use of anticoagulants: Secondary | ICD-10-CM | POA: Diagnosis not present

## 2017-06-30 LAB — CBC WITH DIFFERENTIAL/PLATELET
Basophils Absolute: 0 10*3/uL (ref 0.0–0.1)
Basophils Relative: 0 %
Eosinophils Absolute: 0 10*3/uL (ref 0.0–0.7)
Eosinophils Relative: 0 %
HEMATOCRIT: 40.2 % (ref 39.0–52.0)
Hemoglobin: 13.5 g/dL (ref 13.0–17.0)
LYMPHS PCT: 17 %
Lymphs Abs: 1.4 10*3/uL (ref 0.7–4.0)
MCH: 31.6 pg (ref 26.0–34.0)
MCHC: 33.6 g/dL (ref 30.0–36.0)
MCV: 94.1 fL (ref 78.0–100.0)
MONO ABS: 0.6 10*3/uL (ref 0.1–1.0)
MONOS PCT: 8 %
NEUTROS ABS: 6.5 10*3/uL (ref 1.7–7.7)
Neutrophils Relative %: 75 %
Platelets: 141 10*3/uL — ABNORMAL LOW (ref 150–400)
RBC: 4.27 MIL/uL (ref 4.22–5.81)
RDW: 12.8 % (ref 11.5–15.5)
WBC: 8.6 10*3/uL (ref 4.0–10.5)

## 2017-06-30 LAB — BASIC METABOLIC PANEL
ANION GAP: 8 (ref 5–15)
BUN: 12 mg/dL (ref 6–20)
CALCIUM: 9.6 mg/dL (ref 8.9–10.3)
CO2: 27 mmol/L (ref 22–32)
Chloride: 104 mmol/L (ref 101–111)
Creatinine, Ser: 0.89 mg/dL (ref 0.61–1.24)
GFR calc Af Amer: >60 mL/min (ref >60)
GFR calc non Af Amer: >60 mL/min (ref >60)
GLUCOSE: 91 mg/dL (ref 65–99)
POTASSIUM: 4.6 mmol/L (ref 3.5–5.1)
Sodium: 139 mmol/L (ref 135–145)

## 2017-06-30 LAB — URINALYSIS, MICROSCOPIC (REFLEX)

## 2017-06-30 LAB — URINALYSIS, ROUTINE W REFLEX MICROSCOPIC
Bilirubin Urine: NEGATIVE
GLUCOSE, UA: NEGATIVE mg/dL
KETONES UR: NEGATIVE mg/dL
Nitrite: NEGATIVE
PH: 6 (ref 5.0–8.0)
Protein, ur: 100 mg/dL — AB
Specific Gravity, Urine: >1.03 — ABNORMAL HIGH (ref 1.005–1.030)

## 2017-06-30 MED ORDER — CIPROFLOXACIN HCL 500 MG PO TABS: 500.0000 mg | ORAL_TABLET | Freq: Two times a day (BID) | ORAL | 0 refills | Status: DC

## 2017-06-30 MED FILL — CIPROFLOXACIN HCL 500 MG TA: 500 | 7 days supply | Qty: 14 | Fill #0

## 2017-06-30 MED FILL — OXYCODONE-APAP 10-325 MG TA: 10-325 | 30 days supply | Qty: 60 | Fill #0

## 2017-06-30 NOTE — ED Notes (Addendum)
Pt c/o hematuria and urinary frequency onset Sunday. Bone and prostate cancer survivor in remission for 5-6 years. He was put on xarelto around 5 months ago.

## 2017-06-30 NOTE — Discharge Instructions (Signed)
Cipro as prescribed.  Continue your other medications as previously prescribed.  Follow-up with your oncologist on Thursday as scheduled, and return to the ER if your symptoms significantly worsen or change.

## 2017-06-30 NOTE — ED Notes (Signed)
Pt now reports that there is no blood in urine after last restroom visit.

## 2017-06-30 NOTE — ED Triage Notes (Signed)
Patient presents with c/o of hematuria, urinary frequency, and feeling of not emptying appropriately. Patient has a hx of prostate cancer and his also on Xarelto for the past 5 months due to a DVT. Denies any fever. Does report some mild lower abdominal discomfort. Reports he has urinary frequency and feels as though he is going to the bathroom every hour at least once or twice but not emptying much. Sometimes dribbling sometimes just a small amount. He has noticed that his urine has blood content and a pinkish red coloring after urinating several times. Reports he also has burning and discomfort when he has to urinate.
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
## 2017-07-03 ENCOUNTER — Ambulatory Visit (HOSPITAL_BASED_OUTPATIENT_CLINIC_OR_DEPARTMENT_OTHER): Payer: Medicare Other | Admitting: Oncology

## 2017-07-03 ENCOUNTER — Inpatient Hospital Stay (HOSPITAL_COMMUNITY)
Admission: EM | Admit: 2017-07-03 | Discharge: 2017-07-07 | DRG: 389 | Disposition: A | Discharge status: Home / Self Care | Payer: Medicare Other | Attending: Internal Medicine | Admitting: Internal Medicine

## 2017-07-03 ENCOUNTER — Other Ambulatory Visit: Payer: Self-pay

## 2017-07-03 ENCOUNTER — Emergency Department (HOSPITAL_COMMUNITY): Payer: Medicare Other

## 2017-07-03 ENCOUNTER — Encounter (HOSPITAL_COMMUNITY): Payer: Self-pay | Admitting: Emergency Medicine

## 2017-07-03 ENCOUNTER — Telehealth: Payer: Self-pay | Admitting: Oncology

## 2017-07-03 VITALS — BP 175/98 | HR 64 | Temp 99.1°F | Resp 18 | Ht 73.5 in | Wt 163.9 lb

## 2017-07-03 DIAGNOSIS — C9001 Multiple myeloma in remission: Secondary | ICD-10-CM | POA: Diagnosis present

## 2017-07-03 DIAGNOSIS — K56600 Partial intestinal obstruction, unspecified as to cause: Principal | ICD-10-CM | POA: Diagnosis present

## 2017-07-03 DIAGNOSIS — C61 Malignant neoplasm of prostate: Secondary | ICD-10-CM | POA: Diagnosis present

## 2017-07-03 DIAGNOSIS — Z86718 Personal history of other venous thrombosis and embolism: Secondary | ICD-10-CM

## 2017-07-03 DIAGNOSIS — R7989 Other specified abnormal findings of blood chemistry: Secondary | ICD-10-CM | POA: Diagnosis present

## 2017-07-03 DIAGNOSIS — N390 Urinary tract infection, site not specified: Secondary | ICD-10-CM

## 2017-07-03 DIAGNOSIS — I10 Essential (primary) hypertension: Secondary | ICD-10-CM | POA: Diagnosis present

## 2017-07-03 DIAGNOSIS — K56609 Unspecified intestinal obstruction, unspecified as to partial versus complete obstruction: Secondary | ICD-10-CM | POA: Diagnosis not present

## 2017-07-03 DIAGNOSIS — Z886 Allergy status to analgesic agent status: Secondary | ICD-10-CM

## 2017-07-03 DIAGNOSIS — Z8546 Personal history of malignant neoplasm of prostate: Secondary | ICD-10-CM

## 2017-07-03 DIAGNOSIS — Z87891 Personal history of nicotine dependence: Secondary | ICD-10-CM

## 2017-07-03 DIAGNOSIS — I82409 Acute embolism and thrombosis of unspecified deep veins of unspecified lower extremity: Secondary | ICD-10-CM | POA: Diagnosis not present

## 2017-07-03 DIAGNOSIS — C9000 Multiple myeloma not having achieved remission: Secondary | ICD-10-CM | POA: Diagnosis present

## 2017-07-03 DIAGNOSIS — Z7901 Long term (current) use of anticoagulants: Secondary | ICD-10-CM | POA: Diagnosis not present

## 2017-07-03 LAB — COMPREHENSIVE METABOLIC PANEL
ALBUMIN: 4.2 g/dL (ref 3.5–5.0)
ALK PHOS: 106 U/L (ref 38–126)
ALT: 94 U/L — AB (ref 17–63)
AST: 59 U/L — ABNORMAL HIGH (ref 15–41)
Anion gap: 8 (ref 5–15)
BUN: 11 mg/dL (ref 6–20)
CALCIUM: 9.5 mg/dL (ref 8.9–10.3)
CO2: 23 mmol/L (ref 22–32)
CREATININE: 0.93 mg/dL (ref 0.61–1.24)
Chloride: 104 mmol/L (ref 101–111)
GFR calc Af Amer: >60 mL/min (ref >60)
GFR calc non Af Amer: >60 mL/min (ref >60)
GLUCOSE: 106 mg/dL — AB (ref 65–99)
Potassium: 4.1 mmol/L (ref 3.5–5.1)
SODIUM: 135 mmol/L (ref 135–145)
Total Bilirubin: 0.9 mg/dL (ref 0.3–1.2)
Total Protein: 7.4 g/dL (ref 6.5–8.1)

## 2017-07-03 LAB — URINALYSIS, ROUTINE W REFLEX MICROSCOPIC
BILIRUBIN URINE: NEGATIVE
Glucose, UA: NEGATIVE mg/dL
HGB URINE DIPSTICK: NEGATIVE
Ketones, ur: NEGATIVE mg/dL
Leukocytes, UA: NEGATIVE
Nitrite: NEGATIVE
Protein, ur: NEGATIVE mg/dL
SPECIFIC GRAVITY, URINE: 1.004 — AB (ref 1.005–1.030)
pH: 5 (ref 5.0–8.0)

## 2017-07-03 LAB — CBC
HCT: 40.5 % (ref 39.0–52.0)
Hemoglobin: 14 g/dL (ref 13.0–17.0)
MCH: 32.3 pg (ref 26.0–34.0)
MCHC: 34.6 g/dL (ref 30.0–36.0)
MCV: 93.5 fL (ref 78.0–100.0)
PLATELETS: 175 10*3/uL (ref 150–400)
RBC: 4.33 MIL/uL (ref 4.22–5.81)
RDW: 12.7 % (ref 11.5–15.5)
WBC: 5.8 10*3/uL (ref 4.0–10.5)

## 2017-07-03 LAB — HEPARIN LEVEL (UNFRACTIONATED): HEPARIN UNFRACTIONATED: 0.51 [IU]/mL (ref 0.30–0.70)

## 2017-07-03 LAB — APTT: APTT: 31 s (ref 24–36)

## 2017-07-03 LAB — LIPASE, BLOOD: Lipase: 48 U/L (ref 11–51)

## 2017-07-03 MED ORDER — SODIUM CHLORIDE 0.9 % IV SOLN
INTRAVENOUS | Status: DC
  Administered 2017-07-03 (×2): via INTRAVENOUS

## 2017-07-03 MED ORDER — MORPHINE SULFATE (PF) 2 MG/ML IV SOLN
2.0000 mg | INTRAVENOUS | Status: DC | PRN
  Administered 2017-07-03: 2 mg via INTRAVENOUS
  Filled 2017-07-03: qty 1

## 2017-07-03 MED ORDER — IOPAMIDOL (ISOVUE-300) INJECTION 61%
INTRAVENOUS | Status: AC
  Filled 2017-07-03: qty 100

## 2017-07-03 MED ORDER — IOPAMIDOL (ISOVUE-300) INJECTION 61%
100.0000 mL | Freq: Once | INTRAVENOUS | Status: AC | PRN
  Administered 2017-07-03: 100 mL via INTRAVENOUS

## 2017-07-03 MED ORDER — CIPROFLOXACIN IN D5W 400 MG/200ML IV SOLN
400.0000 mg | Freq: Two times a day (BID) | INTRAVENOUS | Status: AC
  Administered 2017-07-03 – 2017-07-06 (×7): 400 mg via INTRAVENOUS
  Filled 2017-07-03 (×8): qty 200

## 2017-07-03 MED ORDER — MORPHINE SULFATE (PF) 4 MG/ML IV SOLN
2.0000 mg | INTRAVENOUS | Status: DC | PRN
  Administered 2017-07-03 – 2017-07-06 (×12): 4 mg via INTRAVENOUS
  Filled 2017-07-03 (×12): qty 1

## 2017-07-03 MED ORDER — HEPARIN (PORCINE) IN NACL 100-0.45 UNIT/ML-% IJ SOLN
1050.0000 [IU]/h | INTRAMUSCULAR | Status: DC
  Administered 2017-07-03: 1100 [IU]/h via INTRAVENOUS
  Administered 2017-07-04: 1050 [IU]/h via INTRAVENOUS
  Filled 2017-07-03 (×3): qty 250
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
## 2017-07-03 NOTE — Progress Notes (Addendum)
[place] for heparin  Indication: hx DVT (home xarelto on hold)  Allergies  Allergen Reactions  . Aspirin     Pt stated "raises BP"     Patient Measurements: Height: 6' 1.5" (186.7 cm) Weight: 160 lb (72.6 kg) IBW/kg (Calculated) : 81.05 Heparin Dosing Weight: 72 kg  Vital Signs: Temp: 98.2 F (36.8 C) (12/20 0859) Temp Source: Oral (12/20 0859) BP: 153/122 (12/20 1454) Pulse Rate: 57 (12/20 1454)  Labs: Recent Labs    07/03/17 1041  HGB 14.0  HCT 40.5  PLT 175  CREATININE 0.93    Estimated Creatinine Clearance: 82.4 mL/min (by C-G formula based on SCr of 0.93 mg/dL).   Medications:  On xarelto 20 mg daily PTA (last dose taken on 07/02/17 at 0600)  Assessment: Patient is a 65 y.o M with hx IgG kappa multiple myeloma, prostate cancer, and  DVT s/p ortho surgery (sept 2018) on xarelto PTA, presented to the ED on 12/20 with c/o abd pain.  Abd CT showed findings consistent with SBO.  To transition patient from xarelto to heparin drip while patient is NPO.  Of note, Dr. Hazeline Junker note on 12/20 indicated that patient should "continue Xarelto upon completing this month of therapy."  Goal of Therapy:  Heparin level 0.3-0.7 units/ml Monitor platelets by anticoagulation protocol: Yes   Plan:  - start heparin drip at 1100 units/hr - check 6 hr heparin level and aPTT. Will monitor aPTT until both heparin level and aPTT correlate with each other. - baseline aPTT and heparin level  - monitor for s/s bleeding  Johneisha Broaden P 07/03/2017,3:53 PM

## 2017-07-03 NOTE — ED Notes (Signed)
Assigned 1510 @ 15:19 call report @ 15:39

## 2017-07-03 NOTE — ED Notes (Signed)
Assigned 1510 @ 15:19 call report @ 13:39

## 2017-07-03 NOTE — ED Notes (Signed)
ED TO INPATIENT HANDOFF REPORT  Name/Age/Gender Travis Nguyen 66 y.o. male  Code Status Code Status History    Date Active Date Inactive Code Status Order ID Comments User Context   05/18/2016 12:27 05/19/2016 14:06 Full Code [id number]  Verlee Monte, MD Inpatient   12/27/2015 22:55 12/29/2015 18:33 Full Code [id number]  Etta Quill, DO ED   05/29/2014 21:30 06/01/2014 17:38 Full Code [id number]  Toy Baker, MD Inpatient   03/18/2014 06:29 03/21/2014 18:01 Full Code [id number]  Phillips Grout, MD Inpatient   02/09/2014 14:06 02/10/2014 19:36 Full Code [id number]  Margo Aye, MD Inpatient    Advance Directive Documentation     Most Recent Value  Type of Advance Directive  Healthcare Power of Attorney, Living will  Pre-existing out of facility DNR order (yellow form or pink MOST form)  No data  "MOST" Form in Place?  No data      Home/SNF/Other Home  Chief Complaint Abd Pain  Level of Care/Admitting Diagnosis ED Disposition    ED Disposition Condition Comment   Admit  Hospital Area: [facility] [[id number]]  Level of Care: Med-Surg [16]  Diagnosis: SBO (small bowel obstruction) [facility]) [734287]  Admitting Physician: Caren Griffins [phone]  Attending Physician: Caren Griffins [[id number]]  PT Class (Do Not Modify): Observation [104]  PT Acc Code (Do Not Modify): Observation [10022]       Medical History Past Medical History:  Diagnosis Date  . Anemia    hx of   . Arthritis    DJD lower back  . Back pain   . Gall stones    hx of  . Heart murmur    asymptomatic   . Hypertension   . Melanoma ([place])    does not have melanoma!!! (per pt)  . Multiple myeloma ([place]) dx'd 10/2009   chemo  . Pneumonia    x1  . Prostate cancer ([place]) 11/2012   gleason 3+4=7, volume 24 gm  . Sinus problem     Allergies Allergies  Allergen Reactions  . Aspirin     Pt stated "raises BP"     IV Location/Drains/Wounds Patient Lines/Drains/Airways Status    Active Line/Drains/Airways    Name:   Placement date:   Placement time:   Site:   Days:   Implanted Port 11/02/12 Right Chest   11/02/12    1443    Chest   1704   Peripheral IV 07/03/17 Left;Anterior Forearm   07/03/17    1048    Forearm   less than 1          Labs/Imaging Results for orders placed or performed during the hospital encounter of 07/03/17 (from the past 48 hour(s))  Lipase, blood     Status: None   Collection Time: 07/03/17 10:41 AM  Result Value Ref Range   Lipase 48 11 - 51 U/L  Comprehensive metabolic panel     Status: Abnormal   Collection Time: 07/03/17 10:41 AM  Result Value Ref Range   Sodium 135 135 - 145 mmol/L   Potassium 4.1 3.5 - 5.1 mmol/L   Chloride 104 101 - 111 mmol/L   CO2 23 22 - 32 mmol/L   Glucose, Bld 106 (H) 65 - 99 mg/dL   BUN 11 6 - 20 mg/dL   Creatinine, Ser 0.93 0.61 - 1.24 mg/dL   Calcium 9.5 8.9 - 10.3 mg/dL   Total Protein 7.4 6.5 - 8.1 g/dL   Albumin 4.2 3.5 - 5.0  g/dL   AST 59 (H) 15 - 41 U/L   ALT 94 (H) 17 - 63 U/L   Alkaline Phosphatase 106 38 - 126 U/L   Total Bilirubin 0.9 0.3 - 1.2 mg/dL   GFR calc non Af Amer >60 >60 mL/min   GFR calc Af Amer >60 >60 mL/min    Comment: (NOTE) The eGFR has been calculated using the CKD EPI equation. This calculation has not been validated in all clinical situations. eGFR's persistently <60 mL/min signify possible Chronic Kidney Disease.    Anion gap 8 5 - 15  CBC     Status: None   Collection Time: 07/03/17 10:41 AM  Result Value Ref Range   WBC 5.8 4.0 - 10.5 K/uL   RBC 4.33 4.22 - 5.81 MIL/uL   Hemoglobin 14.0 13.0 - 17.0 g/dL   HCT 40.5 39.0 - 52.0 %   MCV 93.5 78.0 - 100.0 fL   MCH 32.3 26.0 - 34.0 pg   MCHC 34.6 30.0 - 36.0 g/dL   RDW 12.7 11.5 - 15.5 %   Platelets 175 150 - 400 K/uL  Urinalysis, Routine w reflex microscopic     Status: Abnormal   Collection Time: 07/03/17 10:41 AM  Result Value Ref Range   Color, Urine STRAW (A) YELLOW   APPearance CLEAR CLEAR    Specific Gravity, Urine 1.004 (L) 1.005 - 1.030   pH 5.0 5.0 - 8.0   Glucose, UA NEGATIVE NEGATIVE mg/dL   Hgb urine dipstick NEGATIVE NEGATIVE   Bilirubin Urine NEGATIVE NEGATIVE   Ketones, ur NEGATIVE NEGATIVE mg/dL   Protein, ur NEGATIVE NEGATIVE mg/dL   Nitrite NEGATIVE NEGATIVE   Leukocytes, UA NEGATIVE NEGATIVE   Ct Abdomen Pelvis W Contrast  Result Date: 07/03/2017 CLINICAL DATA:  Umbilical pain for 2 days EXAM: CT ABDOMEN AND PELVIS WITH CONTRAST TECHNIQUE: Multidetector CT imaging of the abdomen and pelvis was performed using the standard protocol following bolus administration of intravenous contrast. CONTRAST:  172m ISOVUE-300 IOPAMIDOL (ISOVUE-300) INJECTION 61% COMPARISON:  06/30/2017 FINDINGS: Lower chest:  Negative Hepatobiliary: Ill-defined mass with peripheral nodular enhancement in the subcapsular right liver measuring 2.7 cm. Mass has been noted since at least 2015 and is consistent with hemangioma. Small cysts in the inferior right liver tip.Cholecystectomy. Normal common bile duct diameter. Pancreas: Unremarkable. Spleen: Unremarkable. Adrenals/Urinary Tract: Negative adrenals. Simple right renal cyst measuring approximate 2 cm. Numerous small renal calculi underestimated on this postcontrast scan relative to 06/30/2017 noncontrast study. No hydronephrosis or ureteral stone. Unremarkable bladder. Stomach/Bowel: Dilated fluid-filled small bowel in the pelvis. The colon contains stool diffusely and some fluid and gas is seen within the distal ileum. No discrete obstructive process is seen. Similar pattern was noted on 2015 study. History of appendectomy. Vascular/Lymphatic: No acute vascular abnormality. No mass or adenopathy. Reproductive:Prostatectomy. No masslike finding in the prostate bed. Other: Trace pelvic ascites.  No pneumoperitoneum. Musculoskeletal: No acute abnormalities. Advanced bilateral hip osteoarthritis. L4-5 laminotomy. L5-S1 degenerative disc narrowing.  IMPRESSION: 1. Dilated pelvic bowel loops consistent with early or partial small bowel obstruction. A discrete obstructive process is not identified; similar findings were present on a 2015 CT. 2. Right hepatic hemangioma. 3. Cholecystectomy, appendectomy, and prostatectomy. Electronically Signed   By: JMonte FantasiaM.D.   On: 07/03/2017 13:02    Pending Labs Unresulted Labs (From admission, onward)   Start     Ordered   07/04/17 0500  CBC  Daily,   R     07/03/17 1631   07/03/17  2300  Heparin level (unfractionated)  Once-Timed,   R     07/03/17 1631   07/03/17 1559  APTT  STAT,   R     07/03/17 1558   07/03/17 1559  Heparin level (unfractionated)  STAT,   R     07/03/17 1558   07/03/17 1030  Urine Culture  STAT,   STAT    Question:  Patient immune status  Answer:  Normal   07/03/17 1029   Signed and Held  HIV antibody (Routine Testing)  Once,   R     Signed and Held   Signed and Held  Comprehensive metabolic panel  Tomorrow morning,   R     Signed and Held   Signed and Held  CBC  Tomorrow morning,   R     Signed and Held      Vitals/Pain Today's Vitals   07/03/17 1452 07/03/17 1453 07/03/17 1454 07/03/17 1623  BP:   (!) 153/122   Pulse:   (!) 57   Resp:   17   Temp:      TempSrc:      SpO2:  97% 98%   Weight:      Height:      PainSc: 10-Worst pain ever   6     Isolation Precautions No active isolations  Medications Medications  iopamidol (ISOVUE-300) 61 % injection (not administered)  0.9 %  sodium chloride infusion ( Intravenous Stopped 07/03/17 1623)  ciprofloxacin (CIPRO) IVPB 400 mg (not administered)  morphine 2 MG/ML injection 2-4 mg (2 mg Intravenous Given 07/03/17 1449)  heparin ADULT infusion 100 units/mL (25000 units/241m sodium chloride 0.45%) (not administered)  iopamidol (ISOVUE-300) 61 % injection 100 mL (100 mLs Intravenous Contrast Given 07/03/17 1242)    Mobility walks

## 2017-07-03 NOTE — Progress Notes (Signed)
Hematology and Oncology Follow Up Visit  Travis Nguyen [id number] May 10, 1951 66 y.o. 07/03/2017 8:34 AM Travis Nguyen, Travis Brow, MD   Principle Diagnosis: 66 year old gentleman with the following diagnoses:  1. 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 Initially at smoldering subtype of 11% plasma cell in the bone marrow. He subsequently developed active multiple myeloma in February of 2014 with a 23% plasma cell infiltration. 2. Prostate cancer diagnosed in May of 2014. He presented with an elevated PSA of 13.2 stage TI C. clinical staging. His Gleason score was 3+4 equals 7. 3. Deep vein thrombosis diagnosed in September 2018. This was provoked after orthopedic surgery in July 2018. Ultrasound Doppler showed subacute deep vein thrombosis in the distal popliteal and peroneal vein of the lower extremities.  Prior Therapy: He was treated initially with Cytoxan, Velcade and Decadron and subsequently his regimen changed to a Velcade, Revlimid with dexamethasone. He had an excellent response with that an M spike that is no longer detected an IgG level within normal range at April 2015. He did not have any poor prognostic features on his cytogenetics.   He is status post a robotic-assisted laparoscopic radical prostatectomy and bilateral lymph node dissection on 02/09/2014. The final pathology showed prostate adenocarcinoma Gleason score 4+3 equals 7 involving both lobes. Extraprostatic extension was present. The bladder neck margin was negative. Zero out of two lymph nodes were involved.  Current therapy:  Xarelto started on 03/20/2017 2018. He is currently taking 20 mg daily.   Interim History:  Travis Nguyen presents today for a followup visit. Since the last visit, he was seen in the emergency department 06/30/2017 and was diagnosed with urinary tract infection.  He developed hematuria and urinary retention and was treated with antibiotics at that time.  Since that time, he  continues to have issues with abdominal pain that is consistent with his previous small bowel obstruction in the past.  He is still moving his bowels and does not report any nausea or vomiting.  He continues to take Xarelto without hemoptysis or hematemesis.  He does not report any recent thrombosis episodes.  He did not report any fevers or chills or sweats. Does not report any hematuria or dysuria. Does not report any headaches or blurry vision or double vision. Does not report any syncope or seizures. He does not report any skeletal complaints of arthralgias or myalgias. He does not report any chest pain, shortness of breath, palpitation orthopnea or PND. He does not report any shortness of breath or wheezing. His performance status and activity level remains excellent. Rest of his review of systems unremarkable.  Medications: I have reviewed the patient's current medications.  Current Outpatient Medications  Medication Sig Dispense Refill  . acetaminophen (TYLENOL) 500 MG tablet Take 500-1,000 mg by mouth every 6 (six) hours as needed (For pain.). Reported on 09/19/2015    . lidocaine-prilocaine (EMLA) cream APPLY TOPICALLY TO PORT-A-CATH DAILY AS NEEDED (Patient taking differently: APPLY TOPICALLY TO PORT-A-CATH DAILY AS NEEDED PORT ACCESS.) 30 g 1  . losartan (COZAAR) 100 MG tablet Take 100 mg by mouth every morning.     . Multiple Vitamin (MULTIVITAMIN WITH MINERALS) TABS tablet Take 1 tablet by mouth daily.    [name] oxyCODONE-acetaminophen (PERCOCET) 10-325 MG tablet Take 1 tablet by mouth every 6 (six) hours as needed for pain.     . polyethylene glycol (MIRALAX / GLYCOLAX) packet Take 17 g by mouth daily as needed for mild constipation. 14 each 0  .  rivaroxaban (XARELTO) 20 MG TABS tablet Take 1 tablet (20 mg total) by mouth daily with supper. 30 tablet 0   No current facility-administered medications for this visit.      Allergies:  Allergies  Allergen Reactions  . Aspirin     Pt stated  "raises BP"     Past Medical History, Surgical history, Social history, and Family History were reviewed and updated.   Physical Exam: Blood pressure (!) 175/98, pulse 64, temperature 99.1 F (37.3 C), temperature source Oral, resp. rate 18, height 6' 1.5" (1.867 m), weight 163 lb 14.4 oz (74.3 kg), SpO2 100 %. ECOG: 1 General appearance: Alert, awake gentleman without distress. Head: Normocephalic, without obvious abnormality no oral ulcers or lesions. Neck: no adenopathy Lymph nodes: Cervical, supraclavicular, and axillary nodes normal. Heart:regular rate and rhythm, S1, S2 normal, no murmur, click, rub or gallop Lung:chest clear, no wheezing, rales, normal symmetric air entry Abdomin: soft, non-tender, without masses or organomegaly.  Slightly hyperactive bowel sounds auscultated. EXT:no erythema, induration, or nodules Neurological exam showed no deficits or lesions.  Lab Results: Lab Results  Component Value Date   WBC 8.6 06/30/2017   HGB 13.5 06/30/2017   HCT 40.2 06/30/2017   MCV 94.1 06/30/2017   PLT 141 (L) 06/30/2017     Chemistry      Component Value Date/Time   NA 139 06/30/2017 0533   NA 138 03/27/2017 0838   K 4.6 06/30/2017 0533   K 4.3 03/27/2017 0838   CL 104 06/30/2017 0533   CL 105 01/01/2013 0835   CO2 27 06/30/2017 0533   CO2 24 03/27/2017 0838   BUN 12 06/30/2017 0533   BUN 23.7 03/27/2017 0838   CREATININE 0.89 06/30/2017 0533   CREATININE 1.2 03/27/2017 0838      Component Value Date/Time   CALCIUM 9.6 06/30/2017 0533   CALCIUM 9.9 03/27/2017 0838   ALKPHOS 79 03/27/2017 0838   AST 25 03/27/2017 0838   ALT 26 03/27/2017 0838   BILITOT 0.31 03/27/2017 0838       Results for Travis Nguyen (MRN [id number]) as of 04/03/2017 07:38  Ref. Range 03/21/2016 08:53 09/19/2016 08:33 03/27/2017 08:38  M Protein SerPl Elph-Mcnc Latest Ref Range: Not Observed g/dL Not Observed Not Observed Not Observed  Results for Travis Nguyen (MRN  [id number]) as of 04/03/2017 07:38  Ref. Range 03/21/2016 08:53 09/19/2016 08:33 03/27/2017 08:38  IgG (Immunoglobin G), Serum Latest Ref Range: 700 - 1600 mg/dL 1,057 1,078 1,091     Impression and Plan:  66 year old gentleman with the following issues  1.IgG Kappa Multiple Myeloma: He is status post definitive therapy with Cytoxan, Velcade and dexamethasone and achieved complete response in April 2015. He continues to be on active surveillance after refusing autologous stem cell transplant.  His protein studies obtained on 03/27/2017 continue to show complete response. He has no M spike noted with normal quantitative immunoglobulins and serum light chains.  The plan is to continue with active surveillance at this time and repeat protein studies in April 2019 and every 6 months after that.   2. Prostate cancer: He is status post prostatectomy on 02/09/2014. His pathology showed a Gleason score 4+3 equals 7 with disease involving both lobes. He has extraprostatic extension but no lymph node involvement.   He continues to be on active surveillance under the care of urology. His PSA remains undetectable in March 2018.  3. Port-A-Cath flush: This will be arranged every 2 months. He elected to keep the  Port-A-Cath because of his poor venous access.  4. Small bowel obstruction: He is reporting intermittent abdominal pain that could be consistent with partial small bowel obstruction.  He is concerned about this and will go immediately to the emergency department to have that evaluated.  He appears to be moving his bowels although the pain is very concerning to him.  5. Deep vein thrombosis: He presented with a right lower extremity edema and found to have a popliteal vein thrombosis in December 2018. This appears to be provoked after orthopedic surgery in July 2018.   He completed 3 months of full dose anticoagulation for a provoked thrombosis.  I recommended that he continue Xarelto upon completing  this month of therapy.  6. Followup: Will be in 4 months.    Zola Button, MD 12/20/20188:34 AM

## 2017-07-03 NOTE — Telephone Encounter (Signed)
Gave avs and calendar for April 2019 °

## 2017-07-03 NOTE — ED Notes (Signed)
Bed: [facility] Expected date:  Expected time:  Means of arrival:  Comments: 

## 2017-07-03 NOTE — ED Triage Notes (Signed)
Pt complaint of worsening abdominal pain for a few days; denies n/v/d. Recent treatment for cystitis.

## 2017-07-04 ENCOUNTER — Observation Stay (HOSPITAL_COMMUNITY): Payer: Medicare Other

## 2017-07-04 DIAGNOSIS — K56600 Partial intestinal obstruction, unspecified as to cause: Secondary | ICD-10-CM | POA: Diagnosis not present

## 2017-07-04 DIAGNOSIS — I82409 Acute embolism and thrombosis of unspecified deep veins of unspecified lower extremity: Secondary | ICD-10-CM

## 2017-07-04 DIAGNOSIS — C61 Malignant neoplasm of prostate: Secondary | ICD-10-CM | POA: Diagnosis not present

## 2017-07-04 DIAGNOSIS — K56609 Unspecified intestinal obstruction, unspecified as to partial versus complete obstruction: Secondary | ICD-10-CM | POA: Diagnosis not present

## 2017-07-04 LAB — COMPREHENSIVE METABOLIC PANEL
ALK PHOS: 97 U/L (ref 38–126)
ALT: 144 U/L — AB (ref 17–63)
AST: 135 U/L — AB (ref 15–41)
Albumin: 3.3 g/dL — ABNORMAL LOW (ref 3.5–5.0)
Anion gap: 7 (ref 5–15)
BUN: 9 mg/dL (ref 6–20)
CALCIUM: 8.2 mg/dL — AB (ref 8.9–10.3)
CHLORIDE: 106 mmol/L (ref 101–111)
CO2: 25 mmol/L (ref 22–32)
CREATININE: 0.86 mg/dL (ref 0.61–1.24)
GFR calc Af Amer: >60 mL/min (ref >60)
GFR calc non Af Amer: >60 mL/min (ref >60)
Glucose, Bld: 84 mg/dL (ref 65–99)
Potassium: 4.2 mmol/L (ref 3.5–5.1)
SODIUM: 138 mmol/L (ref 135–145)
Total Bilirubin: 0.8 mg/dL (ref 0.3–1.2)
Total Protein: 6.2 g/dL — ABNORMAL LOW (ref 6.5–8.1)

## 2017-07-04 LAB — CBC
HCT: 38.2 % — ABNORMAL LOW (ref 39.0–52.0)
HEMOGLOBIN: 12.8 g/dL — AB (ref 13.0–17.0)
MCH: 31.7 pg (ref 26.0–34.0)
MCHC: 33.5 g/dL (ref 30.0–36.0)
MCV: 94.6 fL (ref 78.0–100.0)
PLATELETS: 142 10*3/uL — AB (ref 150–400)
RBC: 4.04 MIL/uL — AB (ref 4.22–5.81)
RDW: 12.7 % (ref 11.5–15.5)
WBC: 3.4 10*3/uL — ABNORMAL LOW (ref 4.0–10.5)

## 2017-07-04 LAB — APTT
APTT: 121 s — AB (ref 24–36)
APTT: 119 s — AB (ref 24–36)
aPTT: 62 seconds — ABNORMAL HIGH (ref 24–36)

## 2017-07-04 LAB — URINE CULTURE
Culture: NO GROWTH
SPECIAL REQUESTS: NORMAL

## 2017-07-04 LAB — HEPARIN LEVEL (UNFRACTIONATED)
Heparin Unfractionated: 0.55 IU/mL (ref 0.30–0.70)
Heparin Unfractionated: 0.73 IU/mL — ABNORMAL HIGH (ref 0.30–0.70)
Heparin Unfractionated: 0.97 IU/mL — ABNORMAL HIGH (ref 0.30–0.70)

## 2017-07-04 MED ORDER — DEXTROSE IN LACTATED RINGERS 5 % IV SOLN
INTRAVENOUS | Status: DC
  Administered 2017-07-04 – 2017-07-06 (×4): via INTRAVENOUS

## 2017-07-04 MED ORDER — HEPARIN (PORCINE) IN NACL 100-0.45 UNIT/ML-% IJ SOLN
950.0000 [IU]/h | INTRAMUSCULAR | Status: DC
  Filled 2017-07-04 (×2): qty 250

## 2017-07-04 NOTE — Progress Notes (Signed)
[facility] for heparin  Indication: hx DVT (home xarelto on hold)  Allergies  Allergen Reactions  . Aspirin     Pt stated "raises BP"     Patient Measurements: Height: 6' 1.5" (186.7 cm) Weight: 160 lb (72.6 kg) IBW/kg (Calculated) : 81.05 Heparin Dosing Weight: 72 kg  Vital Signs: Temp: 97.7 F (36.5 C) (12/21 1907) Temp Source: Oral (12/21 1907) BP: 178/117 (12/21 1907) Pulse Rate: 62 (12/21 1907)  Labs: Recent Labs    07/03/17 1041  07/03/17 2354 07/04/17 0100 07/04/17 0638 07/04/17 0844 07/04/17 1051 07/04/17 1850  HGB 14.0  --   --   --  12.8*  --   --   --   HCT 40.5  --   --   --  38.2*  --   --   --   PLT 175  --   --   --  142*  --   --   --   APTT  --    < >  --  62*  --   --  121* 119*  HEPARINUNFRC  --    < > 0.55  --   --   --  0.97* 0.73*  CREATININE 0.93  --   --   --   --  0.86  --   --    < > = values in this interval not displayed.    Estimated Creatinine Clearance: 89.1 mL/min (by C-G formula based on SCr of 0.86 mg/dL).   Medications:  On xarelto 20 mg daily PTA (last dose taken on 07/02/17 at 0600)  Assessment: Patient is a 66 y.o M with hx IgG kappa multiple myeloma, prostate cancer, and  DVT s/p ortho surgery (sept 2018) on xarelto PTA, presented to the ED on 12/20 with c/o abd pain.  Abd CT showed findings consistent with SBO.  To transition patient from xarelto to heparin drip while patient is NPO.  Of note, Dr. Hazeline Junker note on 12/20 indicated that patient should "continue Xarelto upon completing this month of therapy."  07/04/2017 Repeat heparin level and aPTT remain SUPRAtherapeutic following rate reduction earlier today - Heparin level appears to be correlating with aPTT H/H slightly decreased Plts 142 No bleeding noted  Goal of Therapy:  Heparin level 0.3-0.7 units/ml  aPTT 66-102 seconds Monitor platelets by anticoagulation protocol: Yes   Plan:  - decrease heparin drip to 950  units/hr - recheck 6 hr heparin level and aPTT. Suspect can dose per heparin level at this time - monitor for s/s bleeding - f/u when to resume [place], PharmD, BCPS.   Pager: [phone] 07/04/2017 8:05 PM

## 2017-07-04 NOTE — Progress Notes (Signed)
PROGRESS NOTE  Travis Nguyen YJE:[id number] DOB: 1950-08-07 DOA: 07/03/2017 PCP: [name], MD   LOS: 0 days   Brief Narrative / Interim history:  66 y.o. male with medical history significant of multiple myeloma in remission, prostate cancer under active surveillance, seen by [name], history of recurrent partial small bowel obstructions, hypertension, presents to the hospital with chief complaint of intermittent abdominal pain and mild nausea.  CT scan showed partial small bowel obstruction he was admitted to the hospital  Assessment & Plan: Active Problems:   Multiple myeloma (HCC)   Prostate cancer (HCC)   SBO (small bowel obstruction) (HCC)   DVT (deep venous thrombosis) (HCC)   UTI (urinary tract infection)    Partial small bowel obstruction -Patient without nausea or vomiting, passing gas, hold on placing NG tube, anticipate quick resolution -Feels better this morning no longer has crampy abdominal pain, has been passing flatus overnight, x-ray this morning without significant change but with stool burden -Given clinical improvement, will allow clear liquid diet and monitor -Surgery consult if patient is getting worse, hold off on consulting right now  Recent UTI -Diagnosis an outpatient, currently on ciprofloxacin, supposed to finish the Sunday, continue IV ciprofloxacin  History of DVT -Diagnosed recently in the setting of orthopedic intervention, patient followed by [name], patient was recommended to finish up the current bottle of Xarelto for the rest of this month and then stop, placed on heparin infusion -If he is tolerating clear liquids today without further nausea vomiting, resume Xarelto tomorrow  History of multiple myeloma/prostate cancer -Followed as an outpatient    DVT prophylaxis: heparin Code Status: Full code Family Communication: no family at bedside Disposition Plan: home when ready   Consultants:   None   Procedures:    None   Antimicrobials:  None    Subjective: - no chest pain, shortness of breath, no abdominal pain, nausea or vomiting.  Passing flatus overnight, no BM yet  Objective: Vitals:   07/03/17 1652 07/03/17 2203 07/03/17 2206 07/04/17 0609  BP: (!) 157/87 137/84 138/82 (!) 142/82  Pulse: (!) 54 (!) 53 60 (!) 57  Resp: _0 Temp: 99.1 F (37.3 C) 98.3 F (36.8 C) 98.7 F (37.1 C) 98.3 F (36.8 C)  TempSrc: Oral Oral Oral Oral  SpO2: 100% 98% 99% 100%  Weight:      Height:        Intake/Output Summary (Last 24 hours) at 07/04/2017 0950 Last data filed at 07/03/2017 2203 Gross per 24 hour  Intake 1000 ml  Output 200 ml  Net 800 ml   Filed Weights   07/03/17 0859  Weight: 72.6 kg (160 lb)    Examination:  Constitutional: NAD Eyes: lids and conjunctivae normal ENMT: Mucous membranes are moist Respiratory: clear to auscultation bilaterally, no wheezing, no crackles.  Cardiovascular: Regular rate and rhythm, no murmurs / rubs / gallops.  Abdomen: no tenderness. Bowel sounds positive.  Skin: no rashes Neurologic: CN 2-12 grossly intact. Strength 5/5 in all 4.  Psychiatric: Normal judgment and insight. Alert and oriented x 3.   Data Reviewed: I have independently reviewed following labs and imaging studies   CBC: Recent Labs  Lab 06/30/17 0533 07/03/17 1041 07/04/17 0638  WBC 8.6 5.8 3.4*  NEUTROABS 6.5  --   --   HGB 13.5 14.0 12.8*  HCT 40.2 40.5 38.2*  MCV 94.1 93.5 94.6  PLT 141* 175 637*   Basic Metabolic Panel: Recent Labs  Lab  06/30/17 0533 07/03/17 1041 07/04/17 0844  NA 139 135 138  K 4.6 4.1 4.2  CL 104 104 106  CO2 _0 GLUCOSE 91 106* 84  BUN _1 CREATININE 0.89 0.93 0.86  CALCIUM 9.6 9.5 8.2*   GFR: Estimated Creatinine Clearance: 89.1 mL/min (by C-G formula based on SCr of 0.86 mg/dL). Liver Function Tests: Recent Labs  Lab 07/03/17 1041 07/04/17 0844  AST 59* 135*  ALT 94* 144*  ALKPHOS 106 97  BILITOT  0.9 0.8  PROT 7.4 6.2*  ALBUMIN 4.2 3.3*   Recent Labs  Lab 07/03/17 1041  LIPASE 48   No results for input(s): AMMONIA in the last 168 hours. Coagulation Profile: No results for input(s): INR, PROTIME in the last 168 hours. Cardiac Enzymes: No results for input(s): CKTOTAL, CKMB, CKMBINDEX, TROPONINI in the last 168 hours. BNP (last 3 results) No results for input(s): PROBNP in the last 8760 hours. HbA1C: No results for input(s): HGBA1C in the last 72 hours. CBG: No results for input(s): GLUCAP in the last 168 hours. Lipid Profile: No results for input(s): CHOL, HDL, LDLCALC, TRIG, CHOLHDL, LDLDIRECT in the last 72 hours. Thyroid Function Tests: No results for input(s): TSH, T4TOTAL, FREET4, T3FREE, THYROIDAB in the last 72 hours. Anemia Panel: No results for input(s): VITAMINB12, FOLATE, FERRITIN, TIBC, IRON, RETICCTPCT in the last 72 hours. Urine analysis:    Component Value Date/Time   COLORURINE STRAW (A) 07/03/2017 1041   APPEARANCEUR CLEAR 07/03/2017 1041   LABSPEC 1.004 (L) 07/03/2017 1041   PHURINE 5.0 07/03/2017 1041   GLUCOSEU NEGATIVE 07/03/2017 1041   HGBUR NEGATIVE 07/03/2017 [place] 07/03/2017 [place] 07/03/2017 1041   PROTEINUR NEGATIVE 07/03/2017 1041   UROBILINOGEN 0.2 03/18/2014 0149   NITRITE NEGATIVE 07/03/2017 1041   LEUKOCYTESUR NEGATIVE 07/03/2017 1041   Sepsis Labs: Invalid input(s): PROCALCITONIN, LACTICIDVEN  No results found for this or any previous visit (from the past 240 hour(s)).    Radiology Studies: Ct Abdomen Pelvis W Contrast  Result Date: 07/03/2017 CLINICAL DATA:  Umbilical pain for 2 days EXAM: CT ABDOMEN AND PELVIS WITH CONTRAST TECHNIQUE: Multidetector CT imaging of the abdomen and pelvis was performed using the standard protocol following bolus administration of intravenous contrast. CONTRAST:  118m ISOVUE-300 IOPAMIDOL (ISOVUE-300) INJECTION 61% COMPARISON:  06/30/2017 FINDINGS: Lower  chest:  Negative Hepatobiliary: Ill-defined mass with peripheral nodular enhancement in the subcapsular right liver measuring 2.7 cm. Mass has been noted since at least 2015 and is consistent with hemangioma. Small cysts in the inferior right liver tip.Cholecystectomy. Normal common bile duct diameter. Pancreas: Unremarkable. Spleen: Unremarkable. Adrenals/Urinary Tract: Negative adrenals. Simple right renal cyst measuring approximate 2 cm. Numerous small renal calculi underestimated on this postcontrast scan relative to 06/30/2017 noncontrast study. No hydronephrosis or ureteral stone. Unremarkable bladder. Stomach/Bowel: Dilated fluid-filled small bowel in the pelvis. The colon contains stool diffusely and some fluid and gas is seen within the distal ileum. No discrete obstructive process is seen. Similar pattern was noted on 2015 study. History of appendectomy. Vascular/Lymphatic: No acute vascular abnormality. No mass or adenopathy. Reproductive:Prostatectomy. No masslike finding in the prostate bed. Other: Trace pelvic ascites.  No pneumoperitoneum. Musculoskeletal: No acute abnormalities. Advanced bilateral hip osteoarthritis. L4-5 laminotomy. L5-S1 degenerative disc narrowing. IMPRESSION: 1. Dilated pelvic bowel loops consistent with early or partial small bowel obstruction. A discrete obstructive process is not identified; similar findings were present on a 2015 CT. 2. Right hepatic hemangioma. 3. Cholecystectomy,  appendectomy, and prostatectomy. Electronically Signed   By: Monte Fantasia M.D.   On: 07/03/2017 13:02   Dg Abd Portable 2v  Result Date: 07/04/2017 CLINICAL DATA:  Small bowel obstruction EXAM: PORTABLE ABDOMEN - 2 VIEW COMPARISON:  07/03/2017 CT FINDINGS: Mildly prominent small bowel loops in the left abdomen, similar to prior study. No free air. Large stool burden in the right colon. Prior cholecystectomy. IMPRESSION: Continued small bowel obstruction pattern, not significantly changed.  Large stool burden in the right colon. Prior cholecystectomy. Electronically Signed   By: Rolm Baptise M.D.   On: 07/04/2017 08:54     Scheduled Meds: Continuous Infusions: . sodium chloride 125 mL/hr at 07/03/17 1806  . ciprofloxacin 400 mg (07/04/17 0855)  . dextrose 5% lactated ringers    . heparin 1,200 Units/hr (07/04/17 0235)    Marzetta Board, MD, PhD [facility] Pager [phone]  If 7PM-7AM, please contact night-coverage [email or web address] Password [email or web address] 07/04/2017, 9:50 AM

## 2017-07-04 NOTE — Progress Notes (Signed)
[facility] for heparin  Indication: hx DVT (home xarelto on hold)  Allergies  Allergen Reactions  . Aspirin     Pt stated "raises BP"     Patient Measurements: Height: 6' 1.5" (186.7 cm) Weight: 160 lb (72.6 kg) IBW/kg (Calculated) : 81.05 Heparin Dosing Weight: 72 kg  Vital Signs: Temp: 98.3 F (36.8 C) (12/21 0609) Temp Source: Oral (12/21 0609) BP: 142/82 (12/21 0609) Pulse Rate: 57 (12/21 0609)  Labs: Recent Labs    07/03/17 1041 07/03/17 1623 07/03/17 2354 07/04/17 0100 07/04/17 0638 07/04/17 0844 07/04/17 1051  HGB 14.0  --   --   --  12.8*  --   --   HCT 40.5  --   --   --  38.2*  --   --   PLT 175  --   --   --  142*  --   --   APTT  --  31  --  62*  --   --  121*  HEPARINUNFRC  --  0.51 0.55  --   --   --  0.97*  CREATININE 0.93  --   --   --   --  0.86  --     Estimated Creatinine Clearance: 89.1 mL/min (by C-G formula based on SCr of 0.86 mg/dL).   Medications:  On xarelto 20 mg daily PTA (last dose taken on 07/02/17 at 0600)  Assessment: Patient is a 66 y.o M with hx IgG kappa multiple myeloma, prostate cancer, and  DVT s/p ortho surgery (sept 2018) on xarelto PTA, presented to the ED on 12/20 with c/o abd pain.  Abd CT showed findings consistent with SBO.  To transition patient from xarelto to heparin drip while patient is NPO.  Of note, Dr. Hazeline Junker note on 12/20 indicated that patient should "continue Xarelto upon completing this month of therapy."  07/04/2017 HL 0.97, aPTT 121 both supratherapeutic H/H slightly decreased Plts 142 No bleeding noted  Goal of Therapy:  Heparin level 0.3-0.7 units/ml  aPTT 66-102 seconds Monitor platelets by anticoagulation protocol: Yes   Plan:  - decrease heparin drip to 1050 units/hr - recheck 6 hr heparin level and aPTT. Will monitor aPTT until both heparin level and aPTT correlate with each other. - monitor for s/s bleeding - f/u when to resume [place] 07/04/2017, 12:19 PM Pager [phone]

## 2017-07-04 NOTE — Care Management Note (Signed)
Case Management Note  Patient Details  Name: Travis Nguyen MRN: [id number] Date of Birth: 23-Oct-1950  Subjective/Objective:                  sbo  Action/Plan: Date: July 04, 2017 [name], BSN, [place], [place] Chart and notes review for patient progress and needs. Will follow for case management and discharge needs. Next review date: [id number]  Expected Discharge Date:  (unknown)               Expected Discharge Plan:  Home/Self Care  In-House Referral:     Discharge planning Services  CM Consult  Post Acute Care Choice:    Choice offered to:     DME Arranged:    DME Agency:     HH Arranged:    HH Agency:     Status of Service:  In process, will continue to follow  If discussed at Long Length of Stay Meetings, dates discussed:    Additional Comments:  Leeroy Cha, RN 07/04/2017, 8:40 AM

## 2017-07-04 NOTE — Progress Notes (Signed)
ANTICOAGULATION CONSULT NOTE - Follow Up Consult  Pharmacy Consult for Heparin Indication: hx DVT (home xarelto on hold)   Allergies  Allergen Reactions  . Aspirin     Pt stated "raises BP"     Patient Measurements: Height: 6' 1.5" (186.7 cm) Weight: 160 lb (72.6 kg) IBW/kg (Calculated) : 81.05 Heparin Dosing Weight:   Vital Signs: Temp: 98.7 F (37.1 C) (12/20 2206) Temp Source: Oral (12/20 2206) BP: 138/82 (12/20 2206) Pulse Rate: 60 (12/20 2206)  Labs: Recent Labs    07/03/17 1041 07/03/17 1623 07/03/17 2354 07/04/17 0100  HGB 14.0  --   --   --   HCT 40.5  --   --   --   PLT 175  --   --   --   APTT  --  31  --  62*  HEPARINUNFRC  --  0.51 0.55  --   CREATININE 0.93  --   --   --     Estimated Creatinine Clearance: 82.4 mL/min (by C-G formula based on SCr of 0.93 mg/dL).   Medications:  Infusions:  . sodium chloride 125 mL/hr at 07/03/17 1806  . ciprofloxacin 400 mg (07/03/17 2056)  . heparin 1,100 Units/hr (07/03/17 1807)    Assessment: Patient with heparin level at goal but PTT just below goal.  PTT ordered with Heparin level until both correlate due to possible drug-lab interaction between oral anticoagulant (rivaroxaban, edoxaban, or apixaban) and anti-Xa level (aka heparin level) No heparin issues per RN.  Goal of Therapy:  Heparin level 0.3-0.7 units/ml aPTT 66-102 seconds Monitor platelets by anticoagulation protocol: Yes   Plan:  Increase heparin to 1200 units/hr Recheck heparin level and PTT at [address] [place] 07/04/2017,2:23 AM

## 2017-07-05 ENCOUNTER — Observation Stay (HOSPITAL_COMMUNITY): Payer: Medicare Other

## 2017-07-05 DIAGNOSIS — Z8546 Personal history of malignant neoplasm of prostate: Secondary | ICD-10-CM | POA: Diagnosis not present

## 2017-07-05 DIAGNOSIS — R7989 Other specified abnormal findings of blood chemistry: Secondary | ICD-10-CM | POA: Diagnosis present

## 2017-07-05 DIAGNOSIS — Z86718 Personal history of other venous thrombosis and embolism: Secondary | ICD-10-CM | POA: Diagnosis not present

## 2017-07-05 DIAGNOSIS — Z87891 Personal history of nicotine dependence: Secondary | ICD-10-CM | POA: Diagnosis not present

## 2017-07-05 DIAGNOSIS — C9001 Multiple myeloma in remission: Secondary | ICD-10-CM | POA: Diagnosis present

## 2017-07-05 DIAGNOSIS — I10 Essential (primary) hypertension: Secondary | ICD-10-CM | POA: Diagnosis present

## 2017-07-05 DIAGNOSIS — K56600 Partial intestinal obstruction, unspecified as to cause: Secondary | ICD-10-CM | POA: Diagnosis present

## 2017-07-05 DIAGNOSIS — N390 Urinary tract infection, site not specified: Secondary | ICD-10-CM | POA: Diagnosis present

## 2017-07-05 DIAGNOSIS — Z886 Allergy status to analgesic agent status: Secondary | ICD-10-CM | POA: Diagnosis not present

## 2017-07-05 DIAGNOSIS — K56609 Unspecified intestinal obstruction, unspecified as to partial versus complete obstruction: Secondary | ICD-10-CM | POA: Diagnosis not present

## 2017-07-05 LAB — BASIC METABOLIC PANEL
ANION GAP: 5 (ref 5–15)
BUN: 6 mg/dL (ref 6–20)
CHLORIDE: 107 mmol/L (ref 101–111)
CO2: 26 mmol/L (ref 22–32)
CREATININE: 0.74 mg/dL (ref 0.61–1.24)
Calcium: 9 mg/dL (ref 8.9–10.3)
GFR calc non Af Amer: >60 mL/min (ref >60)
Glucose, Bld: 102 mg/dL — ABNORMAL HIGH (ref 65–99)
POTASSIUM: 3.9 mmol/L (ref 3.5–5.1)
Sodium: 138 mmol/L (ref 135–145)

## 2017-07-05 LAB — HEPATIC FUNCTION PANEL
ALBUMIN: 3.7 g/dL (ref 3.5–5.0)
ALT: 173 U/L — ABNORMAL HIGH (ref 17–63)
AST: 120 U/L — AB (ref 15–41)
Alkaline Phosphatase: 110 U/L (ref 38–126)
Bilirubin, Direct: <0.1 mg/dL — ABNORMAL LOW (ref 0.1–0.5)
TOTAL PROTEIN: 6.8 g/dL (ref 6.5–8.1)
Total Bilirubin: 0.9 mg/dL (ref 0.3–1.2)

## 2017-07-05 LAB — HEPARIN LEVEL (UNFRACTIONATED)
HEPARIN UNFRACTIONATED: 0.48 [IU]/mL (ref 0.30–0.70)
Heparin Unfractionated: 0.73 IU/mL — ABNORMAL HIGH (ref 0.30–0.70)
Heparin Unfractionated: 0.35 IU/mL (ref 0.30–0.70)

## 2017-07-05 LAB — HIV ANTIBODY (ROUTINE TESTING W REFLEX): HIV Screen 4th Generation wRfx: NONREACTIVE

## 2017-07-05 LAB — APTT: APTT: 110 s — AB (ref 24–36)

## 2017-07-05 LAB — CBC
HEMATOCRIT: 36.7 % — AB (ref 39.0–52.0)
HEMOGLOBIN: 12.1 g/dL — AB (ref 13.0–17.0)
MCH: 31.2 pg (ref 26.0–34.0)
MCHC: 33 g/dL (ref 30.0–36.0)
MCV: 94.6 fL (ref 78.0–100.0)
Platelets: 146 10*3/uL — ABNORMAL LOW (ref 150–400)
RBC: 3.88 MIL/uL — AB (ref 4.22–5.81)
RDW: 12.8 % (ref 11.5–15.5)
WBC: 3 10*3/uL — ABNORMAL LOW (ref 4.0–10.5)

## 2017-07-05 MED ORDER — HEPARIN (PORCINE) IN NACL 100-0.45 UNIT/ML-% IJ SOLN
900.0000 [IU]/h | INTRAMUSCULAR | Status: DC
  Administered 2017-07-05 – 2017-07-06 (×2): 900 [IU]/h via INTRAVENOUS
  Filled 2017-07-05 (×3): qty 250

## 2017-07-05 MED ORDER — BISACODYL 10 MG RE SUPP
10.0000 mg | Freq: Once | RECTAL | Status: AC
  Administered 2017-07-05: 10 mg via RECTAL
  Filled 2017-07-05: qty 1

## 2017-07-05 NOTE — Progress Notes (Signed)
ANTICOAGULATION CONSULT NOTE - Follow Up Consult  Pharmacy Consult for Heparin Indication: hx DVT (home xarelto on hold)    Allergies  Allergen Reactions  . Aspirin     Pt stated "raises BP"     Patient Measurements: Height: 6' 1.5" (186.7 cm) Weight: 160 lb (72.6 kg) IBW/kg (Calculated) : 81.05 Heparin Dosing Weight:   Vital Signs: Temp: 97.9 F (36.6 C) (12/22 0448) Temp Source: Oral (12/22 0448) BP: 151/91 (12/22 0448) Pulse Rate: 65 (12/22 0448)  Labs: Recent Labs    07/03/17 1041  07/04/17 0638 07/04/17 0844 07/04/17 1051 07/04/17 1850 07/05/17 0355  HGB 14.0  --  12.8*  --   --   --  12.1*  HCT 40.5  --  38.2*  --   --   --  36.7*  PLT 175  --  142*  --   --   --  146*  APTT  --    < >  --   --  121* 119* 110*  HEPARINUNFRC  --    < >  --   --  0.97* 0.73* 0.73*  CREATININE 0.93  --   --  0.86  --   --  0.74   < > = values in this interval not displayed.    Estimated Creatinine Clearance: 95.8 mL/min (by C-G formula based on SCr of 0.74 mg/dL).   Medications:  Infusions:  . ciprofloxacin Stopped (07/04/17 2139)  . dextrose 5% lactated ringers 75 mL/hr at 07/05/17 0404  . heparin 950 Units/hr (07/04/17 2027)  . heparin      Assessment: Patient with heparin level and PTT both just slightly above goal.  PTT ordered with Heparin level until both correlate due to possible drug-lab interaction between oral anticoagulant (rivaroxaban, edoxaban, or apixaban) and anti-Xa level (aka heparin level).  With both levels just above goal, will only monitor heparin levels going forward.  No heparin issues per RN.  Goal of Therapy:  Heparin level 0.3-0.7 units/ml Monitor platelets by anticoagulation protocol: Yes   Plan:  Decrease heparin to 900 units/hr Recheck level at [address] [place] 07/05/2017,6:01 AM

## 2017-07-05 NOTE — Progress Notes (Signed)
ANTICOAGULATION CONSULT NOTE - Follow Up Consult  Pharmacy Consult for Heparin Indication: hx DVT (home xarelto on hold)   Allergies  Allergen Reactions  . Aspirin     Pt stated "raises BP"     Patient Measurements: Height: 6' 1.5" (186.7 cm) Weight: 160 lb (72.6 kg) IBW/kg (Calculated) : 81.05 Heparin Dosing Weight:   Vital Signs: Temp: 98 F (36.7 C) (12/22 2032) Temp Source: Oral (12/22 2032) BP: 156/86 (12/22 2032) Pulse Rate: 67 (12/22 2032)  Labs: Recent Labs    07/03/17 1041  07/04/17 7096 07/04/17 0844 07/04/17 1051 07/04/17 1850 07/05/17 0355 07/05/17 1443 07/05/17 2022  HGB 14.0  --  12.8*  --   --   --  12.1*  --   --   HCT 40.5  --  38.2*  --   --   --  36.7*  --   --   PLT 175  --  142*  --   --   --  146*  --   --   APTT  --    < >  --   --  121* 119* 110*  --   --   HEPARINUNFRC  --    < >  --   --  0.97* 0.73* 0.73* 0.48 0.35  CREATININE 0.93  --   --  0.86  --   --  0.74  --   --    < > = values in this interval not displayed.    Estimated Creatinine Clearance: 95.8 mL/min (by C-G formula based on SCr of 0.74 mg/dL).   Medications:  Infusions:  . ciprofloxacin Stopped (07/05/17 2032)  . dextrose 5% lactated ringers 75 mL/hr at 07/05/17 1717  . heparin 900 Units/hr (07/05/17 1734)    Assessment: Patient with heparin level at goal again.  No heparin issues noted.  Goal of Therapy:  Heparin level 0.3-0.7 units/ml Monitor platelets by anticoagulation protocol: Yes   Plan:  Continue heparin drip at current rate Recheck level with AM labs  Nani Skillern [place] 07/05/2017,9:40 PM

## 2017-07-05 NOTE — Progress Notes (Signed)
PROGRESS NOTE    Travis Nguyen  TMH:[id number] DOB: [birth year] DOA: 07/03/2017 PCP: [name], MD   Brief Narrative:  66 y.o.malewith medical history significant ofmultiple myeloma in remission, prostate cancer under active surveillance, seen by Dr.[name],history of recurrent partial small bowel obstructions, hypertension, presents to the hospital with chief complaint of intermittent abdominal pain and mild nausea.  CT scan showed partial small bowel obstruction he was admitted to the hospital  Assessment & Plan:   Active Problems:   Multiple myeloma (HCC)   Prostate cancer (HCC)   SBO (small bowel obstruction) (HCC)   DVT (deep venous thrombosis) (HCC)   UTI (urinary tract infection)   Partial small bowel obstruction - Patient without nausea or vomiting, passing gas, hold on placing NG tube, anticipate quick resolution - No longer has crampy abdominal pain, has been passing flatus overnight - x-ray this morning with some improvement in small bowel loop distention, prominent stool in R colon - Given clinical improvement, will advance to full liquid diet and monitor - Will trial suppository - Surgery consult if patient is getting worse, hold off on consulting right now  Recent UTI -Diagnosis an outpatient, currently on ciprofloxacin, supposed to finish the Sunday, continue IV ciprofloxacin  History of DVT -Diagnosed recently in the setting of orthopedic intervention, patient followed by Dr.[name],patient was recommended to finish up the current bottle of Xarelto for the rest of this month and then stop - placed on heparin infusion, will continue this for now, resume xarelto when able  History of multiple myeloma/prostate cancer -Followed as an outpatient  Elevated LFT's: f/u hepatitis panel - repeat HFP  DVT prophylaxis: heparin gtt Code Status: full code Family Communication: none at bedside Disposition Plan: pending improvement   Consultants:    none  Procedures:   none  Antimicrobials: (specify start and planned stop date. Auto populated tables are space occupying and do not give end dates) Anti-infectives (From admission, onward)   Start     Dose/Rate Route Frequency Ordered Stop   07/03/17 2000  ciprofloxacin (CIPRO) IVPB 400 mg     40 0 mg 200 mL/hr over 60 Minutes Intravenous Every 12 hours 07/03/17 1547          Subjective: No abdominal pain. Continuing to pass gas.  Objective: Vitals:   07/04/17 1907 07/04/17 2006 07/04/17 2229 07/05/17 0448  BP: (!) 178/117 (!) 149/103 (!) 143/95 (!) 151/91  Pulse: 62 78 65 65  Resp: (!) 183 20 18 18   Temp: 97.7 F (36.5 C)   97.9 F (36.6 C)  TempSrc: Oral Oral  Oral  SpO2: 100% 100% 100% 100%  Weight:      Height:        Intake/Output Summary (Last 24 hours) at 07/05/2017 0808 Last data filed at 07/05/2017 0457 Gross per 24 hour  Intake 480 ml  Output 1650 ml  Net -1170 ml   Filed Weights   07/03/17 0859  Weight: 72.6 kg (160 lb)    Examination:  General exam: Appears calm and comfortable  Respiratory system: Clear to auscultation. Respiratory effort normal. Cardiovascular system: S1 & S2 heard, RRR. No JVD, murmurs, rubs, gallops or clicks. No pedal edema. Gastrointestinal system: Abdomen is nondistended, soft and nontender. No organomegaly or masses felt. No bowel sounds appreciated. Central nervous system: Alert and oriented. No focal neurological deficits. Extremities: No LEE Skin: No rashes, lesions or ulcers Psychiatry: Judgement and insight appear normal. Mood & affect appropriate.     Data Reviewed: I have personally  reviewed following labs and imaging studies  CBC: Recent Labs  Lab 06/30/17 0533 07/03/17 1041 07/04/17 0638 07/05/17 0355  WBC 8.6 5.8 3.4* 3.0*  NEUTROABS 6.5  --   --   --   HGB 13.5 14.0 12.8* 12.1*  HCT 40.2 40.5 38.2* 36.7*  MCV 94.1 93.5 94.6 94.6  PLT 141* 175 142* 709*   Basic Metabolic Panel: Recent Labs   Lab 06/30/17 0533 07/03/17 1041 07/04/17 0844 07/05/17 0355  NA 139 135 138 138  K 4.6 4.1 4.2 3.9  CL 104 104 106 107  CO2 27 23 25 26   GLUCOSE 91 106* 84 102*  BUN 12 11 9 6   CREATININE 0.89 0.93 0.86 0.74  CALCIUM 9.6 9.5 8.2* 9.0   GFR: Estimated Creatinine Clearance: 95.8 mL/min (by C-G formula based on SCr of 0.74 mg/dL). Liver Function Tests: Recent Labs  Lab 07/03/17 1041 07/04/17 0844  AST 59* 135*  ALT 94* 144*  ALKPHOS 106 97  BILITOT 0.9 0.8  PROT 7.4 6.2*  ALBUMIN 4.2 3.3*   Recent Labs  Lab 07/03/17 1041  LIPASE 48   No results for input(s): AMMONIA in the last 168 hours. Coagulation Profile: No results for input(s): INR, PROTIME in the last 168 hours. Cardiac Enzymes: No results for input(s): CKTOTAL, CKMB, CKMBINDEX, TROPONINI in the last 168 hours. BNP (last 3 results) No results for input(s): PROBNP in the last 8760 hours. HbA1C: No results for input(s): HGBA1C in the last 72 hours. CBG: No results for input(s): GLUCAP in the last 168 hours. Lipid Profile: No results for input(s): CHOL, HDL, LDLCALC, TRIG, CHOLHDL, LDLDIRECT in the last 72 hours. Thyroid Function Tests: No results for input(s): TSH, T4TOTAL, FREET4, T3FREE, THYROIDAB in the last 72 hours. Anemia Panel: No results for input(s): VITAMINB12, FOLATE, FERRITIN, TIBC, IRON, RETICCTPCT in the last 72 hours. Sepsis Labs: No results for input(s): PROCALCITON, LATICACIDVEN in the last 168 hours.  Recent Results (from the past 240 hour(s))  Urine Culture     Status: None   Collection Time: 07/03/17 10:41 AM  Result Value Ref Range Status   Specimen Description URINE, CLEAN CATCH  Final   Special Requests Normal  Final   Culture   Final    NO GROWTH Performed at [facility], [address] [place], [place] [address]    Report Status 07/04/2017 FINAL  Final         Radiology Studies: Ct Abdomen Pelvis W Contrast  Result Date: 07/03/2017 CLINICAL DATA:  Umbilical  pain for 2 days EXAM: CT ABDOMEN AND PELVIS WITH CONTRAST TECHNIQUE: Multidetector CT imaging of the abdomen and pelvis was performed using the standard protocol following bolus administration of intravenous contrast. CONTRAST:  116m ISOVUE-300 IOPAMIDOL (ISOVUE-300) INJECTION 61% COMPARISON:  06/30/2017 FINDINGS: Lower chest:  Negative Hepatobiliary: Ill-defined mass with peripheral nodular enhancement in the subcapsular right liver measuring 2.7 cm. Mass has been noted since at least 2015 and is consistent with hemangioma. Small cysts in the inferior right liver tip.Cholecystectomy. Normal common bile duct diameter. Pancreas: Unremarkable. Spleen: Unremarkable. Adrenals/Urinary Tract: Negative adrenals. Simple right renal cyst measuring approximate 2 cm. Numerous small renal calculi underestimated on this postcontrast scan relative to 06/30/2017 noncontrast study. No hydronephrosis or ureteral stone. Unremarkable bladder. Stomach/Bowel: Dilated fluid-filled small bowel in the pelvis. The colon contains stool diffusely and some fluid and gas is seen within the distal ileum. No discrete obstructive process is seen. Similar pattern was noted on 2015 study. History of appendectomy.  Vascular/Lymphatic: No acute vascular abnormality. No mass or adenopathy. Reproductive:Prostatectomy. No masslike finding in the prostate bed. Other: Trace pelvic ascites.  No pneumoperitoneum. Musculoskeletal: No acute abnormalities. Advanced bilateral hip osteoarthritis. L4-5 laminotomy. L5-S1 degenerative disc narrowing. IMPRESSION: 1. Dilated pelvic bowel loops consistent with early or partial small bowel obstruction. A discrete obstructive process is not identified; similar findings were present on a 2015 CT. 2. Right hepatic hemangioma. 3. Cholecystectomy, appendectomy, and prostatectomy. Electronically Signed   By: Monte Fantasia M.D.   On: 07/03/2017 13:02   Dg Abd Portable 2v  Result Date: 07/05/2017 CLINICAL DATA:  Small  bowel obstruction EXAM: PORTABLE ABDOMEN - 2 VIEW COMPARISON:  Yesterday FINDINGS: Large stool burden in the ascending colon. Less gaseous distention of small bowel loops, especially towards the pelvis. Cholecystectomy clips. No evidence of pneumoperitoneum. Advanced bilateral hip osteoarthritis. IMPRESSION: 1. Partial small bowel obstruction with some improvement in small bowel loop distention. 2. Prominent stool in the right colon. Electronically Signed   By: Monte Fantasia M.D.   On: 07/05/2017 07:11   Dg Abd Portable 2v  Result Date: 07/04/2017 CLINICAL DATA:  Small bowel obstruction EXAM: PORTABLE ABDOMEN - 2 VIEW COMPARISON:  07/03/2017 CT FINDINGS: Mildly prominent small bowel loops in the left abdomen, similar to prior study. No free air. Large stool burden in the right colon. Prior cholecystectomy. IMPRESSION: Continued small bowel obstruction pattern, not significantly changed. Large stool burden in the right colon. Prior cholecystectomy. Electronically Signed   By: Rolm Baptise M.D.   On: 07/04/2017 08:54        Scheduled Meds: Continuous Infusions: . ciprofloxacin 400 mg (07/05/17 0741)  . dextrose 5% lactated ringers 75 mL/hr at 07/05/17 0404  . heparin 950 Units/hr (07/04/17 2027)  . heparin 900 Units/hr (07/05/17 2536)     LOS: 0 days    Time spent: over 13 min    Fayrene Helper, MD [facility] Pager [phone]  If 7PM-7AM, please contact night-coverage [email or web address] Password [email or web address] 07/05/2017, 8:08 AM

## 2017-07-05 NOTE — Progress Notes (Signed)
[place] for heparin  Indication: hx DVT (home xarelto on hold)  Allergies  Allergen Reactions  . Aspirin     Pt stated "raises BP"     Patient Measurements: Height: 6' 1.5" (186.7 cm) Weight: 160 lb (72.6 kg) IBW/kg (Calculated) : 81.05 Heparin Dosing Weight: 72 kg  Vital Signs: Temp: 98.3 F (36.8 C) (12/22 1329) Temp Source: Oral (12/22 1329) BP: 161/94 (12/22 1329) Pulse Rate: 50 (12/22 1329)  Labs: Recent Labs    07/03/17 1041  07/04/17 0638 07/04/17 0844 07/04/17 1051 07/04/17 1850 07/05/17 0355 07/05/17 1443  HGB 14.0  --  12.8*  --   --   --  12.1*  --   HCT 40.5  --  38.2*  --   --   --  36.7*  --   PLT 175  --  142*  --   --   --  146*  --   APTT  --    < >  --   --  121* 119* 110*  --   HEPARINUNFRC  --    < >  --   --  0.97* 0.73* 0.73* 0.48  CREATININE 0.93  --   --  0.86  --   --  0.74  --    < > = values in this interval not displayed.    Estimated Creatinine Clearance: 95.8 mL/min (by C-G formula based on SCr of 0.74 mg/dL).   Medications:  On xarelto 20 mg daily PTA (last dose taken on 07/02/17 at 0600)  Assessment: Patient is a 66 y.o M with hx IgG kappa multiple myeloma, prostate cancer, and  DVT s/p ortho surgery (sept 2018) on xarelto PTA, presented to the ED on 12/20 with c/o abd pain.  Abd CT showed findings consistent with SBO.  To transition patient from xarelto to heparin drip while patient is NPO.  Of note, Dr. Hazeline Junker note on 12/20 indicated that patient should "continue Xarelto upon completing this month of therapy."  Today, 07/05/17  Heparin level now therapeutic on current rate of 900 units/hr  No reported bleeding  CBC stable  Goal of Therapy:  Heparin level 0.3-0.7 units/ml  aPTT 66-102 seconds Monitor platelets by anticoagulation protocol: Yes   Plan:  1) Continue IV heparin at current rate of 900 units/hr 2)  recheck 6 hr heparin level to confirm continued therapeutic level  at current rate 3) f/u when to resume [place], PharmD, BCPS Pager [phone] 07/05/2017 3:29 PM

## 2017-07-06 ENCOUNTER — Inpatient Hospital Stay (HOSPITAL_COMMUNITY): Payer: Medicare Other

## 2017-07-06 ENCOUNTER — Encounter (HOSPITAL_COMMUNITY): Payer: Self-pay | Admitting: Internal Medicine

## 2017-07-06 DIAGNOSIS — K56609 Unspecified intestinal obstruction, unspecified as to partial versus complete obstruction: Secondary | ICD-10-CM

## 2017-07-06 LAB — HEPATITIS PANEL, ACUTE
HCV Ab: <0.1 s/co ratio (ref 0.0–0.9)
HEP B C IGM: NEGATIVE
Hep A IgM: NEGATIVE
Hepatitis B Surface Ag: NEGATIVE

## 2017-07-06 LAB — COMPREHENSIVE METABOLIC PANEL
ALK PHOS: 110 U/L (ref 38–126)
ALT: 157 U/L — AB (ref 17–63)
AST: 113 U/L — ABNORMAL HIGH (ref 15–41)
Albumin: 3.7 g/dL (ref 3.5–5.0)
Anion gap: 5 (ref 5–15)
BUN: 6 mg/dL (ref 6–20)
CALCIUM: 9.2 mg/dL (ref 8.9–10.3)
CO2: 29 mmol/L (ref 22–32)
CREATININE: 0.77 mg/dL (ref 0.61–1.24)
Chloride: 105 mmol/L (ref 101–111)
GFR calc Af Amer: >60 mL/min (ref >60)
GFR calc non Af Amer: >60 mL/min (ref >60)
GLUCOSE: 104 mg/dL — AB (ref 65–99)
Potassium: 4 mmol/L (ref 3.5–5.1)
SODIUM: 139 mmol/L (ref 135–145)
Total Bilirubin: 0.6 mg/dL (ref 0.3–1.2)
Total Protein: 7.1 g/dL (ref 6.5–8.1)

## 2017-07-06 LAB — CBC
HEMATOCRIT: 37.7 % — AB (ref 39.0–52.0)
HEMOGLOBIN: 12.5 g/dL — AB (ref 13.0–17.0)
MCH: 31.5 pg (ref 26.0–34.0)
MCHC: 33.2 g/dL (ref 30.0–36.0)
MCV: 95 fL (ref 78.0–100.0)
Platelets: 161 10*3/uL (ref 150–400)
RBC: 3.97 MIL/uL — ABNORMAL LOW (ref 4.22–5.81)
RDW: 12.7 % (ref 11.5–15.5)
WBC: 3.7 10*3/uL — AB (ref 4.0–10.5)

## 2017-07-06 LAB — HEPARIN LEVEL (UNFRACTIONATED): Heparin Unfractionated: 0.43 IU/mL (ref 0.30–0.70)

## 2017-07-06 MED ORDER — BISACODYL 10 MG RE SUPP
10.0000 mg | Freq: Once | RECTAL | Status: AC
  Administered 2017-07-06: 10 mg via RECTAL
  Filled 2017-07-06: qty 1

## 2017-07-06 NOTE — Progress Notes (Signed)
[place] for heparin  Indication: hx DVT (home xarelto on hold)  Allergies  Allergen Reactions  . Aspirin     Pt stated "raises BP"     Patient Measurements: Height: 6' 1.5" (186.7 cm) Weight: 160 lb (72.6 kg) IBW/kg (Calculated) : 81.05 Heparin Dosing Weight: 72 kg  Vital Signs: Temp: 97.9 F (36.6 C) (12/23 0510) Temp Source: Oral (12/23 0510) BP: 143/87 (12/23 0510) Pulse Rate: 51 (12/23 0510)  Labs: Recent Labs    07/04/17 2224 07/04/17 0844 07/04/17 1051 07/04/17 1850 07/05/17 0355 07/05/17 1443 07/05/17 2022 07/06/17 0330  HGB 12.8*  --   --   --  12.1*  --   --  12.5*  HCT 38.2*  --   --   --  36.7*  --   --  37.7*  PLT 142*  --   --   --  146*  --   --  161  APTT  --   --  121* 119* 110*  --   --   --   HEPARINUNFRC  --   --  0.97* 0.73* 0.73* 0.48 0.35 0.43  CREATININE  --  0.86  --   --  0.74  --   --  0.77    Estimated Creatinine Clearance: 95.8 mL/min (by C-G formula based on SCr of 0.77 mg/dL).   Medications:  On xarelto 20 mg daily PTA (last dose taken on 07/02/17 at 0600)  Assessment: Patient is a 66 y.o M with hx IgG kappa multiple myeloma, prostate cancer, and  DVT s/p ortho surgery (sept 2018) on xarelto PTA, presented to the ED on 12/20 with c/o abd pain.  Abd CT showed findings consistent with SBO.  To transition patient from xarelto to heparin drip while patient is NPO.  Of note, Dr. Hazeline Junker note on 12/20 indicated that patient should "continue Xarelto upon completing this month of therapy."  Today, 07/06/17  Heparin level 0.43, remains therapeutic on Heparin at 900 units/hr  No reported bleeding  CBC stable  LFTs elevated  Goal of Therapy:  Heparin level 0.3-0.7 units/ml  aPTT 66-102 seconds Monitor platelets by anticoagulation protocol: Yes   Plan:   Continue heparin IV infusion at 900 units/hr  Daily heparin level and CBC  Continue to monitor H&H and platelets  F/u when to  resume [place] PharmD, BCPS Pager [phone] 07/06/2017 7:18 AM

## 2017-07-06 NOTE — Progress Notes (Signed)
PROGRESS NOTE    Travis Nguyen  VOJ:[id number] DOB: 09/07/1950 DOA: 07/03/2017 PCP: [name], MD   Brief Narrative:  Patient is a 66 year old male with past medical history of multiple myeloma in remission, prostate cancer, history of recurrent partial small bowel obstruction, hypertension who presented to the emergency department with complaints of abdominal pain and nausea.  CT imaging done in the emergency department showed partial small bowel obstruction and he was admitted for further management. Assessment & Plan:   Principal Problem:   SBO (small bowel obstruction) (HCC) Active Problems:   Multiple myeloma (HCC)   Prostate cancer ([place])   DVT (deep venous thrombosis) (HCC)   UTI (urinary tract infection)   SBO: Patient presented with abdomen pain and nausea.  Currently his abdominal pain has resolved.  No nausea or vomiting.  He is passing gas and had a bowel movement yesterday. Abdominal x-ray showed  single residual loop of dilated bowel in the left hemi abdomen, resolving bowel obstruction.  Also showed moderate colonic stool burden. Ordered Dulcolax. DC planning tomorrow.  Multiple myeloma/prostate cancer: On remission.  Being followed as an outpatient  History of DVT: Diagnosed recently in the setting of orthopedic intervention.  Followed by [name].  On Xarelto at home.  Currently on heparin drip.  Will resume Xarelto on discharge.  UTI: Diagnosed as an outpatient.  Was on ciprofloxacin.  Will finish the antibiotics today.  Increased LFTs: Improving.Hepatitis panel negative.Will follow.   DVT prophylaxis: Heparin Code Status: Full Family Communication: None at the bedside Disposition Plan: Home likely tomorrow   Consultants:  None  Procedures: None  Antimicrobials: None  Subjective:  Patient seen and examined at bedside.  Remains comfortable.  Denies any abdominal pain.  No nausea or vomiting  Objective: Vitals:   07/05/17 0448 07/05/17  1329 07/05/17 2032 07/06/17 0510  BP: (!) 151/91 (!) 161/94 (!) 156/86 (!) 143/87  Pulse: 65 (!) 50 67 (!) 51  Resp: 18 16 18 16   Temp: 97.9 F (36.6 C) 98.3 F (36.8 C) 98 F (36.7 C) 97.9 F (36.6 C)  TempSrc: Oral Oral Oral Oral  SpO2: 100% 100% 100% 100%  Weight:      Height:        Intake/Output Summary (Last 24 hours) at 07/06/2017 1325 Last data filed at 07/06/2017 9937 Gross per 24 hour  Intake 360 ml  Output 900 ml  Net -540 ml   Filed Weights   07/03/17 0859  Weight: 72.6 kg (160 lb)    Examination:  General exam: Appears calm and comfortable ,Not in distress,average built Respiratory system: Bilateral equal air entry, normal vesicular breath sounds, no wheezes or crackles  Cardiovascular system: S1 & S2 heard, RRR. No JVD, murmurs, rubs, gallops or clicks. No pedal edema. Gastrointestinal system: Abdomen is nondistended, soft and nontender. No organomegaly or masses felt. Sluggish bowel sounds . Central nervous system: Alert and oriented. No focal neurological deficits. Extremities: No edema, no clubbing ,no cyanosis, distal peripheral pulses palpable. Skin: No rashes, lesions or ulcers,no icterus ,no pallor Psychiatry: Judgement and insight appear normal. Mood & affect appropriate.     Data Reviewed: I have personally reviewed following labs and imaging studies  CBC: Recent Labs  Lab 06/30/17 0533 07/03/17 1041 07/04/17 0638 07/05/17 0355 07/06/17 0330  WBC 8.6 5.8 3.4* 3.0* 3.7*  NEUTROABS 6.5  --   --   --   --   HGB 13.5 14.0 12.8* 12.1* 12.5*  HCT 40.2 40.5 38.2* 36.7* 37.7*  MCV 94.1 93.5 94.6 94.6 95.0  PLT 141* 175 142* 146* 811   Basic Metabolic Panel: Recent Labs  Lab 06/30/17 0533 07/03/17 1041 07/04/17 0844 07/05/17 0355 07/06/17 0330  NA 139 135 138 138 139  K 4.6 4.1 4.2 3.9 4.0  CL 104 104 106 107 105  CO2 27 23 25 26 29   GLUCOSE 91 106* 84 102* 104*  BUN 12 11 9 6 6   CREATININE 0.89 0.93 0.86 0.74 0.77  CALCIUM 9.6 9.5  8.2* 9.0 9.2   GFR: Estimated Creatinine Clearance: 95.8 mL/min (by C-G formula based on SCr of 0.77 mg/dL). Liver Function Tests: Recent Labs  Lab 07/03/17 1041 07/04/17 0844 07/05/17 0902 07/06/17 0330  AST 59* 135* 120* 113*  ALT 94* 144* 173* 157*  ALKPHOS 106 97 110 110  BILITOT 0.9 0.8 0.9 0.6  PROT 7.4 6.2* 6.8 7.1  ALBUMIN 4.2 3.3* 3.7 3.7   Recent Labs  Lab 07/03/17 1041  LIPASE 48   No results for input(s): AMMONIA in the last 168 hours. Coagulation Profile: No results for input(s): INR, PROTIME in the last 168 hours. Cardiac Enzymes: No results for input(s): CKTOTAL, CKMB, CKMBINDEX, TROPONINI in the last 168 hours. BNP (last 3 results) No results for input(s): PROBNP in the last 8760 hours. HbA1C: No results for input(s): HGBA1C in the last 72 hours. CBG: No results for input(s): GLUCAP in the last 168 hours. Lipid Profile: No results for input(s): CHOL, HDL, LDLCALC, TRIG, CHOLHDL, LDLDIRECT in the last 72 hours. Thyroid Function Tests: No results for input(s): TSH, T4TOTAL, FREET4, T3FREE, THYROIDAB in the last 72 hours. Anemia Panel: No results for input(s): VITAMINB12, FOLATE, FERRITIN, TIBC, IRON, RETICCTPCT in the last 72 hours. Sepsis Labs: No results for input(s): PROCALCITON, LATICACIDVEN in the last 168 hours.  Recent Results (from the past 240 hour(s))  Urine Culture     Status: None   Collection Time: 07/03/17 10:41 AM  Result Value Ref Range Status   Specimen Description URINE, CLEAN CATCH  Final   Special Requests Normal  Final   Culture   Final    NO GROWTH Performed at [facility], [address], [place], [place] [address]    Report Status 07/04/2017 FINAL  Final         Radiology Studies: Dg Abd 2 Views  Result Date: 07/06/2017 CLINICAL DATA:  66 year old male with small bowel obstruction EXAM: ABDOMEN - 2 VIEW COMPARISON:  CT scan of the abdomen and pelvis 07/03/2017 ; prior abdominal radiographs 07/05/2017  FINDINGS: Improving bowel gas pattern. There is a single residual loop of dilated bowel in the left hemiabdomen. The maximal diameter is 3.4 cm. Surgical clips in a right upper quadrant again noted consistent with prior cholecystectomy. No evidence of free air. Moderate colonic stool burden. Bilateral hip joint osteoarthritis. IMPRESSION: 1. Improving bowel gas pattern consistent with resolving small bowel obstruction. 2. Bilateral hip joint osteoarthritis. Electronically Signed   By: Jacqulynn Cadet M.D.   On: 07/06/2017 09:18   Dg Abd Portable 2v  Result Date: 07/05/2017 CLINICAL DATA:  Small bowel obstruction EXAM: PORTABLE ABDOMEN - 2 VIEW COMPARISON:  Yesterday FINDINGS: Large stool burden in the ascending colon. Less gaseous distention of small bowel loops, especially towards the pelvis. Cholecystectomy clips. No evidence of pneumoperitoneum. Advanced bilateral hip osteoarthritis. IMPRESSION: 1. Partial small bowel obstruction with some improvement in small bowel loop distention. 2. Prominent stool in the right colon. Electronically Signed   By: Neva Seat.D.  On: 07/05/2017 07:11        Scheduled Meds: Continuous Infusions: . ciprofloxacin Stopped (07/06/17 1112)  . dextrose 5% lactated ringers 75 mL/hr at 07/05/17 1717  . heparin 900 Units/hr (07/05/17 1734)     LOS: 1 day    Time spent: 25 mins    Javad Salva Jodie Echevaria, MD [facility] Pager [phone]  If 7PM-7AM, please contact night-coverage [email or web address] Password [email or web address] 07/06/2017, 1:25 PM

## 2017-07-07 LAB — COMPREHENSIVE METABOLIC PANEL
ALT: 198 U/L — ABNORMAL HIGH (ref 17–63)
ANION GAP: 6 (ref 5–15)
AST: 220 U/L — ABNORMAL HIGH (ref 15–41)
Albumin: 3.7 g/dL (ref 3.5–5.0)
Alkaline Phosphatase: 106 U/L (ref 38–126)
BILIRUBIN TOTAL: 0.8 mg/dL (ref 0.3–1.2)
BUN: <5 mg/dL — ABNORMAL LOW (ref 6–20)
CO2: 27 mmol/L (ref 22–32)
Calcium: 9.1 mg/dL (ref 8.9–10.3)
Chloride: 102 mmol/L (ref 101–111)
Creatinine, Ser: 0.8 mg/dL (ref 0.61–1.24)
Glucose, Bld: 108 mg/dL — ABNORMAL HIGH (ref 65–99)
POTASSIUM: 4.3 mmol/L (ref 3.5–5.1)
Sodium: 135 mmol/L (ref 135–145)
TOTAL PROTEIN: 6.7 g/dL (ref 6.5–8.1)

## 2017-07-07 LAB — HEPARIN LEVEL (UNFRACTIONATED): Heparin Unfractionated: 0.31 IU/mL (ref 0.30–0.70)

## 2017-07-07 NOTE — Progress Notes (Signed)
Date: July 07, 2017 Chart review for discharge needs:  None found for case management. Patient has no questions concerning post hospital care. 

## 2017-07-07 NOTE — Discharge Summary (Signed)
Physician Discharge Summary  Travis Nguyen ULA:[id number] DOB: July 17, 1950 DOA: 07/03/2017  PCP: [name], MD  Admit date: 07/03/2017 Discharge date: 07/07/2017  Admitted From: Home Disposition: Home  Recommendations for Outpatient Follow-up:  1. Follow up with PCP in 1-2 weeks 2. Please obtain LFT in one week  Home Health:No Equipment/Devices:None  Discharge Condition: Stable CODE STATUS: Full Diet recommendation: Heart Healthy   Brief/Interim Summary:  Patient is a 66 year old male with past medical history of multiple myeloma in remission, prostate cancer, history of recurrent partial small bowel obstruction, hypertension who presented to the emergency department with complaints of abdominal pain and nausea.  CT imaging done in the emergency department showed partial small bowel obstruction and he was admitted for further management. Patient was kept n.p.o. .He did not need any NG tube insertion during hospitalization because his abdominal pain was pretty mild and he was passing gas.  The patient's bowel obstruction resolved, he has been having bowel movements at present.   This morning he was found to be comfortable.  He denies any abdominal pain. Patient will be discharged to home today.    Following problems were addressed during his hospitalization:  SBO: Patient presented with abdomen pain and nausea.  Currently his abdominal pain has resolved.  No nausea or vomiting.  He is passing gas and has been having bowel movements. Abdominal x-ray done on 07/06/17 showed  single residual loop of dilated bowel in the left hemi abdomen, resolving bowel obstruction.  Patient has history of small bowel obstruction in the past.   Multiple myeloma/prostate cancer: On remission.  Being followed as an outpatient by his oncologist.  History of DVT: Diagnosed recently in the setting of orthopedic intervention.  Followed by [name].  On Xarelto at home.  Was on heparin drip her  during hospitalization.  Will resume Xarelto on discharge.  UTI: Diagnosed as an outpatient.  Was on ciprofloxacin.  Finished the antibiotics course.  Increased LFTs: Unknown etiology . ? NASH. Hepatitis panel negative.Bilirubin normal. On comparison to his previous LFTs, this is abnormal. CT abdomen/pelvis done during this admission showed right hepatic hemangioma which is not a new finding.  Patient has been requested to check his liver function tests in a week as an outpatient.    Discharge Diagnoses:  Principal Problem:   SBO (small bowel obstruction) (HCC) Active Problems:   Multiple myeloma (HCC)   Prostate cancer ([place])   DVT (deep venous thrombosis) (HCC)   UTI (urinary tract infection)    Discharge Instructions  Discharge Instructions    Diet - low sodium heart healthy   Complete by:  As directed    Discharge instructions   Complete by:  As directed    1) Please follow up with her PCP and Oncologist in a week. 2) Continue your home medications. 3) Please check your liver function tests in a week.   Increase activity slowly   Complete by:  As directed      Allergies as of 07/07/2017      Reactions   Aspirin    Pt stated "raises BP"       Medication List    STOP taking these medications   acetaminophen 500 MG tablet Commonly known as:  TYLENOL   ciprofloxacin 500 MG tablet Commonly known as:  CIPRO   oxyCODONE-acetaminophen 10-325 MG tablet Commonly known as:  PERCOCET     TAKE these medications   lidocaine-prilocaine cream Commonly known as:  EMLA APPLY TOPICALLY TO PORT-A-CATH DAILY AS  NEEDED What changed:  See the new instructions.   losartan 100 MG tablet Commonly known as:  COZAAR Take 100 mg by mouth every morning.   multivitamin with minerals Tabs tablet Take 1 tablet by mouth daily.   polyethylene glycol packet Commonly known as:  MIRALAX / GLYCOLAX Take 17 g by mouth daily as needed for mild constipation.   rivaroxaban 20 MG Tabs  tablet Commonly known as:  XARELTO Take 1 tablet (20 mg total) by mouth daily with supper.      Follow-up Information    [name], MD. Schedule an appointment as soon as possible for a visit in 1 week(s).   Specialty:  Family Medicine Contact information: [address] [place] [address] [phone]          Allergies  Allergen Reactions  . Aspirin     Pt stated "raises BP"     Consultations:  None   Procedures/Studies: Ct Abdomen Pelvis W Contrast  Result Date: 07/03/2017 CLINICAL DATA:  Umbilical pain for 2 days EXAM: CT ABDOMEN AND PELVIS WITH CONTRAST TECHNIQUE: Multidetector CT imaging of the abdomen and pelvis was performed using the standard protocol following bolus administration of intravenous contrast. CONTRAST:  142m ISOVUE-300 IOPAMIDOL (ISOVUE-300) INJECTION 61% COMPARISON:  06/30/2017 FINDINGS: Lower chest:  Negative Hepatobiliary: Ill-defined mass with peripheral nodular enhancement in the subcapsular right liver measuring 2.7 cm. Mass has been noted since at least 2015 and is consistent with hemangioma. Small cysts in the inferior right liver tip.Cholecystectomy. Normal common bile duct diameter. Pancreas: Unremarkable. Spleen: Unremarkable. Adrenals/Urinary Tract: Negative adrenals. Simple right renal cyst measuring approximate 2 cm. Numerous small renal calculi underestimated on this postcontrast scan relative to 06/30/2017 noncontrast study. No hydronephrosis or ureteral stone. Unremarkable bladder. Stomach/Bowel: Dilated fluid-filled small bowel in the pelvis. The colon contains stool diffusely and some fluid and gas is seen within the distal ileum. No discrete obstructive process is seen. Similar pattern was noted on 2015 study. History of appendectomy. Vascular/Lymphatic: No acute vascular abnormality. No mass or adenopathy. Reproductive:Prostatectomy. No masslike finding in the prostate bed. Other: Trace pelvic ascites.  No  pneumoperitoneum. Musculoskeletal: No acute abnormalities. Advanced bilateral hip osteoarthritis. L4-5 laminotomy. L5-S1 degenerative disc narrowing. IMPRESSION: 1. Dilated pelvic bowel loops consistent with early or partial small bowel obstruction. A discrete obstructive process is not identified; similar findings were present on a 2015 CT. 2. Right hepatic hemangioma. 3. Cholecystectomy, appendectomy, and prostatectomy. Electronically Signed   By: JMonte FantasiaM.D.   On: 07/03/2017 13:02   Dg Abd 2 Views  Result Date: 07/06/2017 CLINICAL DATA:  66year old male with small bowel obstruction EXAM: ABDOMEN - 2 VIEW COMPARISON:  CT scan of the abdomen and pelvis 07/03/2017 ; prior abdominal radiographs 07/05/2017 FINDINGS: Improving bowel gas pattern. There is a single residual loop of dilated bowel in the left hemiabdomen. The maximal diameter is 3.4 cm. Surgical clips in a right upper quadrant again noted consistent with prior cholecystectomy. No evidence of free air. Moderate colonic stool burden. Bilateral hip joint osteoarthritis. IMPRESSION: 1. Improving bowel gas pattern consistent with resolving small bowel obstruction. 2. Bilateral hip joint osteoarthritis. Electronically Signed   By: HJacqulynn CadetM.D.   On: 07/06/2017 09:18   Dg Abd Portable 2v  Result Date: 07/05/2017 CLINICAL DATA:  Small bowel obstruction EXAM: PORTABLE ABDOMEN - 2 VIEW COMPARISON:  Yesterday FINDINGS: Large stool burden in the ascending colon. Less gaseous distention of small bowel loops, especially towards the pelvis. Cholecystectomy clips.  No evidence of pneumoperitoneum. Advanced bilateral hip osteoarthritis. IMPRESSION: 1. Partial small bowel obstruction with some improvement in small bowel loop distention. 2. Prominent stool in the right colon. Electronically Signed   By: Monte Fantasia M.D.   On: 07/05/2017 07:11   Dg Abd Portable 2v  Result Date: 07/04/2017 CLINICAL DATA:  Small bowel obstruction EXAM:  PORTABLE ABDOMEN - 2 VIEW COMPARISON:  07/03/2017 CT FINDINGS: Mildly prominent small bowel loops in the left abdomen, similar to prior study. No free air. Large stool burden in the right colon. Prior cholecystectomy. IMPRESSION: Continued small bowel obstruction pattern, not significantly changed. Large stool burden in the right colon. Prior cholecystectomy. Electronically Signed   By: Rolm Baptise M.D.   On: 07/04/2017 08:54   Ct Renal Stone Study  Result Date: 06/30/2017 CLINICAL DATA:  Initial evaluation for gross hematuria. EXAM: CT ABDOMEN AND PELVIS WITHOUT CONTRAST TECHNIQUE: Multidetector CT imaging of the abdomen and pelvis was performed following the standard protocol without IV contrast. COMPARISON:  [facility] CT from 05/18/2016. FINDINGS: Lower chest: Minimal subsegmental atelectatic changes present within the visualized lung bases. Visualized lungs are otherwise clear. Hepatobiliary: 3.3 cm hypodense lesion within the posterior right hepatic lobe, consistent with previous identified hemangioma. Liver otherwise unremarkable. Gallbladder surgically absent. No biliary dilatation. Pancreas: Pancreas within normal limits. Spleen: Spleen within normal limits. Adrenals/Urinary Tract: Adrenal glands are normal. Kidneys equal in size. 2 cm right renal cyst. Bilateral nonobstructive nephrolithiasis measuring up to approximately 4 mm bilaterally. No radiopaque stones seen along the course of either ureter. No hydronephrosis or hydroureter. Partially distended bladder demonstrates no acute abnormality. No layering stones within the bladder lumen. Mild circumferential bladder wall thickening likely related incomplete distension. Stomach/Bowel: Stomach within normal limits. No evidence for bowel obstruction. Appendix normal. No acute inflammatory changes seen about the bowels. Vascular/Lymphatic: Intra-abdominal aorta of normal caliber. No adenopathy. Reproductive: Prostate normal. Other: No free air or fluid.  Musculoskeletal: No acute osseous abnormality. No worrisome lytic or blastic osseous lesions. Severe osteoarthritic changes about the hips bilaterally. IMPRESSION: 1. Bilateral nonobstructive nephrolithiasis. No CT evidence for obstructive uropathy. 2. No other acute intra-abdominal or pelvic process. 3. Severe osteoarthritic changes about the hips bilaterally. Electronically Signed   By: Jeannine Boga M.D.   On: 06/30/2017 05:28     Subjective: Patient seen and examined the bedside this morning.  Denies any abdominal pain, nausea or vomiting.  He has been having  bowel movements.   Discharge Exam: Vitals:   07/06/17 2031 07/07/17 0440  BP: (!) 158/95 (!) 146/97  Pulse: (!) 59 (!) 59  Resp: 14 16  Temp: 98.1 F (36.7 C) 98.7 F (37.1 C)  SpO2: 100% 100%   Vitals:   07/06/17 0510 07/06/17 1444 07/06/17 2031 07/07/17 0440  BP: (!) 143/87 (!) 161/96 (!) 158/95 (!) 146/97  Pulse: (!) 51 (!) 50 (!) 59 (!) 59  Resp: 16 16 14 16   Temp: 97.9 F (36.6 C) 97.8 F (36.6 C) 98.1 F (36.7 C) 98.7 F (37.1 C)  TempSrc: Oral Oral Oral Oral  SpO2: 100% 100% 100% 100%  Weight:      Height:        General: Pt is alert, awake, not in acute distress Cardiovascular: RRR, S1/S2 +, no rubs, no gallops Respiratory: CTA bilaterally, no wheezing, no rhonchi Abdominal: Soft, NT, ND, bowel sounds + Extremities: no edema, no cyanosis    The results of significant diagnostics from this hospitalization (including imaging, microbiology, ancillary and laboratory) are listed below  for reference.     Microbiology: Recent Results (from the past 240 hour(s))  Urine Culture     Status: None   Collection Time: 07/03/17 10:41 AM  Result Value Ref Range Status   Specimen Description URINE, CLEAN CATCH  Final   Special Requests Normal  Final   Culture   Final    NO GROWTH Performed at [facility], [address] [place], [place] [address]    Report Status 07/04/2017 FINAL  Final      Labs: BNP (last 3 results) No results for input(s): BNP in the last 8760 hours. Basic Metabolic Panel: Recent Labs  Lab 07/03/17 1041 07/04/17 0844 07/05/17 0355 07/06/17 0330 07/07/17 0346  NA 135 138 138 139 135  K 4.1 4.2 3.9 4.0 4.3  CL 104 106 107 105 102  CO2 23 25 26 29 27   GLUCOSE 106* 84 102* 104* 108*  BUN 11 9 6 6  <5*  CREATININE 0.93 0.86 0.74 0.77 0.80  CALCIUM 9.5 8.2* 9.0 9.2 9.1   Liver Function Tests: Recent Labs  Lab 07/03/17 1041 07/04/17 0844 07/05/17 0902 07/06/17 0330 07/07/17 0346  AST 59* 135* 120* 113* 220*  ALT 94* 144* 173* 157* 198*  ALKPHOS 106 97 110 110 106  BILITOT 0.9 0.8 0.9 0.6 0.8  PROT 7.4 6.2* 6.8 7.1 6.7  ALBUMIN 4.2 3.3* 3.7 3.7 3.7   Recent Labs  Lab 07/03/17 1041  LIPASE 48   No results for input(s): AMMONIA in the last 168 hours. CBC: Recent Labs  Lab 07/03/17 1041 07/04/17 0638 07/05/17 0355 07/06/17 0330  WBC 5.8 3.4* 3.0* 3.7*  HGB 14.0 12.8* 12.1* 12.5*  HCT 40.5 38.2* 36.7* 37.7*  MCV 93.5 94.6 94.6 95.0  PLT 175 142* 146* 161   Cardiac Enzymes: No results for input(s): CKTOTAL, CKMB, CKMBINDEX, TROPONINI in the last 168 hours. BNP: Invalid input(s): POCBNP CBG: No results for input(s): GLUCAP in the last 168 hours. D-Dimer No results for input(s): DDIMER in the last 72 hours. Hgb A1c No results for input(s): HGBA1C in the last 72 hours. Lipid Profile No results for input(s): CHOL, HDL, LDLCALC, TRIG, CHOLHDL, LDLDIRECT in the last 72 hours. Thyroid function studies No results for input(s): TSH, T4TOTAL, T3FREE, THYROIDAB in the last 72 hours.  Invalid input(s): FREET3 Anemia work up No results for input(s): VITAMINB12, FOLATE, FERRITIN, TIBC, IRON, RETICCTPCT in the last 72 hours. Urinalysis    Component Value Date/Time   COLORURINE STRAW (A) 07/03/2017 1041   APPEARANCEUR CLEAR 07/03/2017 1041   LABSPEC 1.004 (L) 07/03/2017 1041   PHURINE 5.0 07/03/2017 1041   GLUCOSEU NEGATIVE  07/03/2017 1041   HGBUR NEGATIVE 07/03/2017 [place] 07/03/2017 1041   KETONESUR NEGATIVE 07/03/2017 1041   PROTEINUR NEGATIVE 07/03/2017 1041   UROBILINOGEN 0.2 03/18/2014 0149   NITRITE NEGATIVE 07/03/2017 1041   LEUKOCYTESUR NEGATIVE 07/03/2017 1041   Sepsis Labs Invalid input(s): PROCALCITONIN,  WBC,  LACTICIDVEN Microbiology Recent Results (from the past 240 hour(s))  Urine Culture     Status: None   Collection Time: 07/03/17 10:41 AM  Result Value Ref Range Status   Specimen Description URINE, CLEAN CATCH  Final   Special Requests Normal  Final   Culture   Final    NO GROWTH Performed at [facility], [place] [address], [place], [place] [address]    Report Status 07/04/2017 FINAL  Final     Time coordinating discharge: Over 30 minutes  SIGNED:  Marene Lenz, MD  [facility] 07/07/2017, 11:41 AM Pager [phone]  If 7PM-7AM, please contact night-coverage [email or web address] Password [email or web address]

## 2017-07-07 NOTE — Progress Notes (Signed)
[facility] for heparin  Indication: hx DVT (home xarelto on hold)  Allergies  Allergen Reactions  . Aspirin     Pt stated "raises BP"     Patient Measurements: Height: 6' 1.5" (186.7 cm) Weight: 160 lb (72.6 kg) IBW/kg (Calculated) : 81.05 Heparin Dosing Weight: 72 kg  Vital Signs: Temp: 98.7 F (37.1 C) (12/24 0440) Temp Source: Oral (12/24 0440) BP: 146/97 (12/24 0440) Pulse Rate: 59 (12/24 0440)  Labs: Recent Labs    07/04/17 1051 07/04/17 1850 07/05/17 0355  07/05/17 2022 07/06/17 0330 07/07/17 0346  HGB  --   --  12.1*  --   --  12.5*  --   HCT  --   --  36.7*  --   --  37.7*  --   PLT  --   --  146*  --   --  161  --   APTT 121* 119* 110*  --   --   --   --   HEPARINUNFRC 0.97* 0.73* 0.73*   < > 0.35 0.43 0.31  CREATININE  --   --  0.74  --   --  0.77 0.80   < > = values in this interval not displayed.    Estimated Creatinine Clearance: 95.8 mL/min (by C-G formula based on SCr of 0.8 mg/dL).   Medications:  On xarelto 20 mg daily PTA (last dose taken on 07/02/17 at 0600)  Assessment: Patient is a 66 y.o M with hx IgG kappa multiple myeloma, prostate cancer, and  DVT s/p ortho surgery (sept 2018) on xarelto PTA, presented to the ED on 12/20 with c/o abd pain.  Abd CT showed findings consistent with SBO.  To transition patient from xarelto to heparin drip while patient is NPO.  Of note, Dr. Hazeline Junker note on 12/20 indicated that patient should "continue Xarelto upon completing this month of therapy."  Today, 07/07/17  Heparin level 0.31, remains therapeutic on Heparin at 900 units/hr  No reported bleeding  CBC stable  LFTs elevated  Resolving SBO per abdominal xray  Goal of Therapy:  Heparin level 0.3-0.7 units/ml  Monitor platelets by anticoagulation protocol: Yes   Plan:   Continue heparin IV infusion at 900 units/hr   Daily heparin level and CBC  Continue to monitor H&H and platelets  Noted plans  to resume Xarelto at discharge  Netta Cedars, PharmD, BCPS 07/07/2017 7:05 AM

## 2017-07-14 ENCOUNTER — Other Ambulatory Visit: Payer: Self-pay | Admitting: Oncology

## 2017-07-14 NOTE — Progress Notes (Signed)
Mr. Roesler was recently discharged from the hospital after being hospitalized for small bowel obstruction.  He was noted while he was hospitalized to have elevated AST and ALT.  On 07/03/2017 when I saw him in the cancer center his AST was 59 ALT was 94.  On 07/07/2017 his AST was 220 and ALT was 98.  His bilirubin is normal.  CT scan of the abdomen and pelvis obtained on 07/03/2017 was personally reviewed and showed no evidence of malignancy.  His elevation of liver function test appears to be mild and asymptomatic.  I see no evidence of malignancy contributing to this.  I have recommended discontinuing Xarelto as he completed 3 months of therapy in case this medication is contributing.  He is to follow-up with his primary care physician regarding this finding moving forward.

## 2017-07-25 MED FILL — GAVILYTE-N SOLUTION: 420 | 1 days supply | Qty: 4000 | Fill #0

## 2017-07-28 ENCOUNTER — Ambulatory Visit: Payer: Medicare Other | Admitting: Podiatry

## 2017-07-28 ENCOUNTER — Ambulatory Visit (INDEPENDENT_AMBULATORY_CARE_PROVIDER_SITE_OTHER): Payer: Medicare Other

## 2017-07-28 ENCOUNTER — Ambulatory Visit (INDEPENDENT_AMBULATORY_CARE_PROVIDER_SITE_OTHER): Payer: Medicare Other | Admitting: Podiatry

## 2017-07-28 DIAGNOSIS — M2021 Hallux rigidus, right foot: Secondary | ICD-10-CM

## 2017-07-28 DIAGNOSIS — Z9889 Other specified postprocedural states: Secondary | ICD-10-CM

## 2017-07-28 MED ORDER — HYDROCODONE-ACETAMINOPHEN 5-325 MG PO TABS: 1.0000 | ORAL_TABLET | Freq: Four times a day (QID) | ORAL | 0 refills | Status: DC | PRN

## 2017-07-28 MED ORDER — MELOXICAM 15 MG PO TABS: 15.0000 mg | ORAL_TABLET | Freq: Every day | ORAL | 1 refills | Status: AC

## 2017-07-28 MED FILL — MELOXICAM 15 MG TABLET: 15 | 60 days supply | Qty: 60 | Fill #0

## 2017-07-28 MED FILL — HYDROCODON-APAP 5-325: 5-325 | 15 days supply | Qty: 60 | Fill #0

## 2017-07-29 MED FILL — OXYCODONE-APAP 10-325 MG TA: 10-325 | 30 days supply | Qty: 60 | Fill #0

## 2017-07-30 NOTE — Progress Notes (Signed)
   Subjective:  Patient presents today status post arthrodesis to the first MPJ right foot. Date of surgery 02/06/2017.  He states he is doing well overall.  He reports continued swelling to the right great toe and right lower extremity.  He states he was unable to take medication for swelling previously because he was on anticoagulant therapy.  He is no longer on Xarelto at this time.  Patient is here for further evaluation and treatment.   Past Medical History:  Diagnosis Date  . Anemia    hx of   . Arthritis    DJD lower back  . Back pain   . Gall stones    hx of  . Heart murmur    asymptomatic   . Hypertension   . Melanoma ([place])    does not have melanoma!!! (per pt)  . Multiple myeloma ([place]) dx'd 10/2009   chemo  . Pneumonia    x1  . Prostate cancer ([place]) 11/2012   gleason 3+4=7, volume 24 gm  . Sinus problem       Objective/Physical Exam Skin incisions appear to be well coapted. Minimal edema noted. No hypertrophic scar formation.  No range of motion noted to the first MPJ of the right foot.  Radiographic Exam:  Orthopedic hardware and osteotomies sites appear to be stable with routine healing.   Assessment: 1. s/p first MTPJ arthrodesis right foot. DOS: 02/06/2017 2.  History of right lower extremity DVT   Plan of Care:  1. Patient was evaluated. X-rays reviewed. 2. Prescription for meloxicam given to patient.  Patient is no longer on anticoagulant therapy. 3.  Refill prescription for Vicodin given to patient. 4. Continue wearing good shoe gear. 5. Return to clinic as needed.Edrick Kins, DPM [facility]  Dr. Edrick Kins, [address] [place]                                        [place], [place] [address]                Office [phone]  Fax [phone]

## 2017-08-28 MED FILL — OXYCODONE-APAP 10-325 MG TA: 10-325 | 30 days supply | Qty: 60 | Fill #0

## 2017-08-29 ENCOUNTER — Telehealth: Payer: Self-pay

## 2017-08-29 NOTE — Telephone Encounter (Signed)
Patient came to schedule a flush. Have not had any done sense 11/19. Schedule flush for next week and printed calender. Per 2/15 walk ins

## 2017-09-01 ENCOUNTER — Inpatient Hospital Stay: Payer: Medicare Other | Attending: Oncology

## 2017-09-01 DIAGNOSIS — C9001 Multiple myeloma in remission: Secondary | ICD-10-CM

## 2017-09-01 DIAGNOSIS — C61 Malignant neoplasm of prostate: Secondary | ICD-10-CM | POA: Insufficient documentation

## 2017-09-01 DIAGNOSIS — Z452 Encounter for adjustment and management of vascular access device: Secondary | ICD-10-CM | POA: Diagnosis present

## 2017-09-01 MED ORDER — HEPARIN SOD (PORK) LOCK FLUSH 100 UNIT/ML IV SOLN
500.0000 [IU] | Freq: Once | INTRAVENOUS | Status: AC | PRN
  Administered 2017-09-01: 500 [IU] via INTRAVENOUS
  Filled 2017-09-01: qty 5

## 2017-09-01 MED ORDER — SODIUM CHLORIDE 0.9 % IJ SOLN
10.0000 mL | INTRAMUSCULAR | Status: DC | PRN
  Administered 2017-09-01: 10 mL via INTRAVENOUS
  Filled 2017-09-01: qty 10

## 2017-09-25 MED FILL — OXYCODONE-APAP 10-325 MG TA: 10-325 | 30 days supply | Qty: 60 | Fill #0

## 2017-09-27 ENCOUNTER — Encounter (HOSPITAL_COMMUNITY): Payer: Self-pay | Admitting: Emergency Medicine

## 2017-09-27 ENCOUNTER — Emergency Department (HOSPITAL_COMMUNITY)
Admission: EM | Admit: 2017-09-27 | Discharge: 2017-09-27 | Disposition: A | Discharge status: Home / Self Care | Payer: Medicare Other | Attending: Emergency Medicine | Admitting: Emergency Medicine

## 2017-09-27 DIAGNOSIS — R51 Headache: Secondary | ICD-10-CM | POA: Insufficient documentation

## 2017-09-27 DIAGNOSIS — I10 Essential (primary) hypertension: Secondary | ICD-10-CM | POA: Insufficient documentation

## 2017-09-27 DIAGNOSIS — Z5321 Procedure and treatment not carried out due to patient leaving prior to being seen by health care provider: Secondary | ICD-10-CM | POA: Insufficient documentation

## 2017-09-27 NOTE — ED Triage Notes (Signed)
Patient here from home with complaints of headache. Hypertension hx of same. Reports "I dont take my medicine like I should".

## 2017-09-27 NOTE — ED Notes (Signed)
Patient reports that he does not need to see a doctor, just needed his BP checked. " I just need to lay off the salt".

## 2017-10-23 MED FILL — OXYCODONE-APAP 10-325: 10-325 | 30 days supply | Qty: 60 | Fill #0

## 2017-10-31 ENCOUNTER — Inpatient Hospital Stay: Payer: Medicare Other

## 2017-10-31 ENCOUNTER — Inpatient Hospital Stay: Payer: Medicare Other | Attending: Oncology | Admitting: Oncology

## 2017-10-31 ENCOUNTER — Other Ambulatory Visit: Payer: Medicare Other

## 2017-10-31 ENCOUNTER — Telehealth: Payer: Self-pay | Admitting: Oncology

## 2017-10-31 VITALS — BP 159/76 | HR 60 | Temp 98.7°F | Resp 18 | Ht 73.5 in | Wt 164.4 lb

## 2017-10-31 DIAGNOSIS — Z86718 Personal history of other venous thrombosis and embolism: Secondary | ICD-10-CM | POA: Diagnosis not present

## 2017-10-31 DIAGNOSIS — C9001 Multiple myeloma in remission: Secondary | ICD-10-CM

## 2017-10-31 DIAGNOSIS — Z79899 Other long term (current) drug therapy: Secondary | ICD-10-CM

## 2017-10-31 DIAGNOSIS — Z8546 Personal history of malignant neoplasm of prostate: Secondary | ICD-10-CM | POA: Diagnosis not present

## 2017-10-31 DIAGNOSIS — C61 Malignant neoplasm of prostate: Secondary | ICD-10-CM

## 2017-10-31 DIAGNOSIS — Z7901 Long term (current) use of anticoagulants: Secondary | ICD-10-CM | POA: Diagnosis not present

## 2017-10-31 LAB — CBC WITH DIFFERENTIAL/PLATELET
BASOS ABS: 0 10*3/uL (ref 0.0–0.1)
Basophils Relative: 1 %
EOS ABS: 0.1 10*3/uL (ref 0.0–0.5)
Eosinophils Relative: 2 %
HCT: 39.3 % (ref 38.4–49.9)
HEMOGLOBIN: 13 g/dL (ref 13.0–17.1)
LYMPHS PCT: 40 %
Lymphs Abs: 1.5 10*3/uL (ref 0.9–3.3)
MCH: 31.8 pg (ref 27.2–33.4)
MCHC: 33.2 g/dL (ref 32.0–36.0)
MCV: 95.8 fL (ref 79.3–98.0)
Monocytes Absolute: 0.3 10*3/uL (ref 0.1–0.9)
Monocytes Relative: 9 %
NEUTROS PCT: 48 %
Neutro Abs: 1.8 10*3/uL (ref 1.5–6.5)
Platelets: 148 10*3/uL (ref 140–400)
RBC: 4.1 MIL/uL — AB (ref 4.20–5.82)
RDW: 13.4 % (ref 11.0–14.6)
WBC: 3.7 10*3/uL — AB (ref 4.0–10.3)

## 2017-10-31 LAB — COMPREHENSIVE METABOLIC PANEL
ALT: 24 U/L (ref 0–55)
AST: 26 U/L (ref 5–34)
Albumin: 4 g/dL (ref 3.5–5.0)
Alkaline Phosphatase: 67 U/L (ref 40–150)
Anion gap: 6 (ref 3–11)
BUN: 12 mg/dL (ref 7–26)
CHLORIDE: 105 mmol/L (ref 98–109)
CO2: 26 mmol/L (ref 22–29)
CREATININE: 0.84 mg/dL (ref 0.70–1.30)
Calcium: 9.2 mg/dL (ref 8.4–10.4)
Glucose, Bld: 93 mg/dL (ref 70–140)
POTASSIUM: 4.3 mmol/L (ref 3.5–5.1)
Sodium: 137 mmol/L (ref 136–145)
Total Bilirubin: 0.4 mg/dL (ref 0.2–1.2)
Total Protein: 7.2 g/dL (ref 6.4–8.3)

## 2017-10-31 MED ORDER — HEPARIN SOD (PORK) LOCK FLUSH 100 UNIT/ML IV SOLN
500.0000 [IU] | Freq: Once | INTRAVENOUS | Status: AC | PRN
  Administered 2017-10-31: 500 [IU] via INTRAVENOUS
  Filled 2017-10-31: qty 5

## 2017-10-31 MED ORDER — SODIUM CHLORIDE 0.9 % IJ SOLN
10.0000 mL | INTRAMUSCULAR | Status: DC | PRN
  Administered 2017-10-31: 10 mL via INTRAVENOUS
  Filled 2017-10-31: qty 10

## 2017-10-31 NOTE — Telephone Encounter (Signed)
Scheduled appt per 4/19 los - Gave patient aVS and calender per los.

## 2017-10-31 NOTE — Progress Notes (Signed)
Hematology and Oncology Follow Up Visit  Travis Nguyen [id number] [birth year] 67 y.o. 10/31/2017 8:37 AM Travis Nguyen, Travis Brow, MD   Principle Diagnosis: 67 year old man with:  1. 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 he was found to have 23% plasma cell in the bone marrow.   2.  Stage T1c prostate cancer diagnosed in May of 2014. PSA of 13.2,  Gleason score was 3+4 equals 7.    Prior Therapy: He was treated initially with Cytoxan, Velcade and Decadron and subsequently his regimen changed to a Velcade, Revlimid with dexamethasone.  And has been in remission since 2015.  He is status post a robotic-assisted laparoscopic radical prostatectomy and bilateral lymph node dissection on 02/09/2014. The final pathology showed prostate adenocarcinoma Gleason score 4+3 equals 7 involving both lobes.    Current therapy: Active surveillance.  Interim History:  Travis Nguyen is here for a follow-up visit.  He reports no major changes in his health and continues to feel well.  He has not had any hospitalizations since his last one in December 2018.  He denies abdominal discomfort or change in his bowel habits.  He does report occasional frequency and nocturia but no changes in his performance status or activity level.  He denies any bone pain or pathological fractures.  He denies any worsening neuropathy.  He denies any recent thrombosis or bleeding episodes.    Does not report any headaches or blurry vision or double vision. Does not report any syncope or seizures.  He denies any fevers or chills or sweats.  He does not report any chest pain, shortness of breath, palpitation orthopnea or PND. He does not report any shortness of breath or wheezing.  He denies any nausea, vomiting or hematochezia.  He does not report any skin rashes or lesions.  He does not report any lymphadenopathy or petechiae.  Rest of his review of systems is negative.  Medications: I have reviewed the  patient's current medications.  Current Outpatient Medications  Medication Sig Dispense Refill  . HYDROcodone-acetaminophen (NORCO/VICODIN) 5-325 MG tablet Take 1 tablet by mouth every 6 (six) hours as needed for moderate pain. 60 tablet 0  . lidocaine-prilocaine (EMLA) cream APPLY TOPICALLY TO PORT-A-CATH DAILY AS NEEDED (Patient taking differently: APPLY TOPICALLY TO PORT-A-CATH DAILY AS NEEDED PORT ACCESS.) 30 g 1  . losartan (COZAAR) 100 MG tablet Take 100 mg by mouth every morning.     . Multiple Vitamin (MULTIVITAMIN WITH MINERALS) TABS tablet Take 1 tablet by mouth daily.    . polyethylene glycol (MIRALAX / GLYCOLAX) packet Take 17 g by mouth daily as needed for mild constipation. (Patient not taking: Reported on 07/03/2017) 14 each 0  . rivaroxaban (XARELTO) 20 MG TABS tablet Take 1 tablet (20 mg total) by mouth daily with supper. 30 tablet 0   No current facility-administered medications for this visit.      Allergies:  Allergies  Allergen Reactions  . Aspirin     Pt stated "raises BP"     Past Medical History, Surgical history, Social history, and Family History were reviewed and updated.   Physical Exam: Blood pressure (!) 159/76, pulse 60, temperature 98.7 F (37.1 C), temperature source Oral, resp. rate 18, height 6' 1.5" (1.867 m), weight 164 lb 6.4 oz (74.6 kg), SpO2 100 %. ECOG: 1 General appearance: Well-appearing gentleman without distress. Head: Atraumatic without abnormalities. Eyes: No scleral icterus. Oropharynx: No thrush or ulcers. Lymph nodes: No lymphadenopathy palpated in the cervical, supraclavicular, and  axillary nodes. Heart:regular rate and rhythm without any murmurs or gallops. Lung: Clear to auscultation without any rhonchi, wheezes or dullness to percussion. Abdomin: Soft, nontender without any rebound or guarding. Musculoskeletal: No joint deformity or effusion. Neurological: No motor or sensory deficits.  Lab Results: Lab Results  Component  Value Date   WBC 3.7 (L) 07/06/2017   HGB 12.5 (L) 07/06/2017   HCT 37.7 (L) 07/06/2017   MCV 95.0 07/06/2017   PLT 161 07/06/2017     Chemistry      Component Value Date/Time   NA 135 07/07/2017 0346   NA 138 03/27/2017 0838   K 4.3 07/07/2017 0346   K 4.3 03/27/2017 0838   CL 102 07/07/2017 0346   CL 105 01/01/2013 0835   CO2 27 07/07/2017 0346   CO2 24 03/27/2017 0838   BUN <5 (L) 07/07/2017 0346   BUN 23.7 03/27/2017 0838   CREATININE 0.80 07/07/2017 0346   CREATININE 1.2 03/27/2017 0838      Component Value Date/Time   CALCIUM 9.1 07/07/2017 0346   CALCIUM 9.9 03/27/2017 0838   ALKPHOS 106 07/07/2017 0346   ALKPHOS 79 03/27/2017 0838   AST 220 (H) 07/07/2017 0346   AST 25 03/27/2017 0838   ALT 198 (H) 07/07/2017 0346   ALT 26 03/27/2017 0838   BILITOT 0.8 07/07/2017 0346   BILITOT 0.31 03/27/2017 0838      Results for Travis Nguyen (MRN [id number]) as of 10/31/2017 08:54  Ref. Range 09/19/2016 08:33  Prostate Specific Ag, Serum Latest Ref Range: 0.0 - 4.0 ng/mL <0.1       Impression and Plan:  67 year old man with:   1. IgG Kappa Multiple Myeloma diagnosed in 2014 and has been in remission since 2015 after receiving induction chemotherapy.  He remains on active surveillance without any maintenance therapy at this time.  His protein studies obtained in September 2018 continue to show complete response to therapy without any evidence of relapse.  2.  Stage T1c Gleason score 4+3 = 7 prostate cancer diagnosed in 2015.  He is status post prostatectomy on 02/09/2014.  He remains in remission at this time with his PSA is undetectable.  He follows with [facility] regarding this issue.   3. Port-A-Cath flush: Risks and benefits of removing his Port-A-Cath was discussed today and he would like to keep it given his poor IV access.  His port will be flushed periodically.  4. Small bowel obstruction: Has been intermittent in nature but no GI symptoms reported  at this time.  5. Deep vein thrombosis: Resolved at this time after completing 3 months of anticoagulation.  6. Followup: Will be in 6 months.   15  minutes was spent with the patient face-to-face today.  More than 50% of time was dedicated to patient counseling, education and coordination of his care.   Zola Button, MD 4/19/20198:37 AM

## 2017-10-31 NOTE — Patient Instructions (Signed)

## 2017-11-01 LAB — PROSTATE-SPECIFIC AG, SERUM (LABCORP)

## 2017-11-03 LAB — KAPPA/LAMBDA LIGHT CHAINS
KAPPA FREE LGHT CHN: 20.7 mg/L — AB (ref 3.3–19.4)
KAPPA, LAMDA LIGHT CHAIN RATIO: 1.58 (ref 0.26–1.65)
LAMDA FREE LIGHT CHAINS: 13.1 mg/L (ref 5.7–26.3)

## 2017-11-04 LAB — MULTIPLE MYELOMA PANEL, SERUM
ALBUMIN SERPL ELPH-MCNC: 3.6 g/dL (ref 2.9–4.4)
Albumin/Glob SerPl: 1.3 (ref 0.7–1.7)
Alpha 1: 0.2 g/dL (ref 0.0–0.4)
Alpha2 Glob SerPl Elph-Mcnc: 0.7 g/dL (ref 0.4–1.0)
B-Globulin SerPl Elph-Mcnc: 1 g/dL (ref 0.7–1.3)
GAMMA GLOB SERPL ELPH-MCNC: 1.2 g/dL (ref 0.4–1.8)
Globulin, Total: 3 g/dL (ref 2.2–3.9)
IGA: 201 mg/dL (ref 61–437)
IgG (Immunoglobin G), Serum: 1119 mg/dL (ref 700–1600)
IgM (Immunoglobulin M), Srm: 115 mg/dL (ref 20–172)
Total Protein ELP: 6.6 g/dL (ref 6.0–8.5)

## 2017-11-12 ENCOUNTER — Emergency Department (HOSPITAL_COMMUNITY)
Admission: EM | Admit: 2017-11-12 | Discharge: 2017-11-12 | Disposition: A | Discharge status: Home / Self Care | Payer: Medicare Other | Attending: Emergency Medicine | Admitting: Emergency Medicine

## 2017-11-12 ENCOUNTER — Emergency Department (HOSPITAL_COMMUNITY): Payer: Medicare Other

## 2017-11-12 ENCOUNTER — Encounter (HOSPITAL_COMMUNITY): Payer: Self-pay | Admitting: Emergency Medicine

## 2017-11-12 DIAGNOSIS — Z7901 Long term (current) use of anticoagulants: Secondary | ICD-10-CM | POA: Diagnosis not present

## 2017-11-12 DIAGNOSIS — C9001 Multiple myeloma in remission: Secondary | ICD-10-CM | POA: Insufficient documentation

## 2017-11-12 DIAGNOSIS — Z87891 Personal history of nicotine dependence: Secondary | ICD-10-CM | POA: Insufficient documentation

## 2017-11-12 DIAGNOSIS — Z8546 Personal history of malignant neoplasm of prostate: Secondary | ICD-10-CM | POA: Diagnosis not present

## 2017-11-12 DIAGNOSIS — I10 Essential (primary) hypertension: Secondary | ICD-10-CM | POA: Diagnosis not present

## 2017-11-12 DIAGNOSIS — R05 Cough: Secondary | ICD-10-CM | POA: Diagnosis present

## 2017-11-12 DIAGNOSIS — J069 Acute upper respiratory infection, unspecified: Secondary | ICD-10-CM | POA: Diagnosis not present

## 2017-11-12 DIAGNOSIS — M79604 Pain in right leg: Secondary | ICD-10-CM | POA: Diagnosis not present

## 2017-11-12 DIAGNOSIS — B9789 Other viral agents as the cause of diseases classified elsewhere: Secondary | ICD-10-CM | POA: Diagnosis not present

## 2017-11-12 DIAGNOSIS — Z79899 Other long term (current) drug therapy: Secondary | ICD-10-CM | POA: Insufficient documentation

## 2017-11-12 NOTE — Discharge Instructions (Addendum)
Your symptoms are likely caused by a viral upper respiratory infection. Antibiotics are not helpful in treating viral infection, the virus should run its course in about 5-7 days. Please make sure you are drinking plenty of fluids. You can treat your symptoms supportively with tylenol/ibuprofen for fevers and pains, Zyrtec and Flonase to help with nasal congestion, and over the counter cough syrups and throat lozenges to help with cough. If your symptoms are not improving please follow up with you Primary doctor.   Please follow-up with your primary doctor regarding right leg pain I have low suspicion for blood clot given that your previous one was provoked by surgery.  If you develop persistent fevers, shortness of breath or difficulty breathing, chest pain, severe headache and neck pain, persistent nausea and vomiting or other new or concerning symptoms return to the Emergency department.

## 2017-11-12 NOTE — ED Triage Notes (Addendum)
Pt states 2 weeks of cough with yellow mucous. Pt states his right leg also is aching, no pain behind the knee or calf. Pt has hx of operation to the right foot. Has a hx of blood clot to the leg. Pt has hx of cancer, he is in remission. Vitals normal. Denies chest pain, SOB, N, V or diarrhea. Denies urinary symptoms. Pt states the leg pain feels like his arthritis pain he has had in the past.
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
## 2017-11-20 MED FILL — OXYCODONE-APAP 10-325: 10-325 | 30 days supply | Qty: 60 | Fill #0

## 2017-12-04 MED FILL — LOSARTAN POTASSIUM 100 MG T: 100 | 90 days supply | Qty: 90 | Fill #0

## 2017-12-22 MED FILL — OXYCODONE-APAP 10-325: 10-325 | 30 days supply | Qty: 60 | Fill #0

## 2017-12-31 ENCOUNTER — Inpatient Hospital Stay: Payer: Medicare Other | Attending: Oncology

## 2017-12-31 DIAGNOSIS — Z452 Encounter for adjustment and management of vascular access device: Secondary | ICD-10-CM | POA: Insufficient documentation

## 2017-12-31 DIAGNOSIS — C9001 Multiple myeloma in remission: Secondary | ICD-10-CM | POA: Diagnosis present

## 2017-12-31 DIAGNOSIS — C61 Malignant neoplasm of prostate: Secondary | ICD-10-CM

## 2017-12-31 MED ORDER — HEPARIN SOD (PORK) LOCK FLUSH 100 UNIT/ML IV SOLN
500.0000 [IU] | Freq: Once | INTRAVENOUS | Status: AC | PRN
  Administered 2017-12-31: 500 [IU] via INTRAVENOUS
  Filled 2017-12-31: qty 5

## 2017-12-31 MED ORDER — SODIUM CHLORIDE 0.9 % IJ SOLN
10.0000 mL | INTRAMUSCULAR | Status: DC | PRN
  Administered 2017-12-31: 10 mL via INTRAVENOUS
  Filled 2017-12-31: qty 10

## 2018-01-19 MED FILL — OXYCODONE-APAP 10-325: 10-325 | 30 days supply | Qty: 60 | Fill #0

## 2018-02-17 MED FILL — OXYCODONE-APAP 10-325: 10-325 | 30 days supply | Qty: 60 | Fill #0

## 2018-03-02 ENCOUNTER — Inpatient Hospital Stay: Payer: Medicare Other | Attending: Oncology

## 2018-03-02 DIAGNOSIS — C9001 Multiple myeloma in remission: Secondary | ICD-10-CM | POA: Diagnosis present

## 2018-03-02 DIAGNOSIS — C61 Malignant neoplasm of prostate: Secondary | ICD-10-CM

## 2018-03-02 DIAGNOSIS — Z452 Encounter for adjustment and management of vascular access device: Secondary | ICD-10-CM | POA: Insufficient documentation

## 2018-03-02 MED ORDER — SODIUM CHLORIDE 0.9 % IJ SOLN
10.0000 mL | INTRAMUSCULAR | Status: DC | PRN
Start: 2018-03-02 — End: 2018-03-02
  Administered 2018-03-02: 10 mL via INTRAVENOUS
  Filled 2018-03-02: qty 10

## 2018-03-02 MED ORDER — HEPARIN SOD (PORK) LOCK FLUSH 100 UNIT/ML IV SOLN
500.0000 [IU] | Freq: Once | INTRAVENOUS | Status: AC | PRN
  Administered 2018-03-02: 500 [IU] via INTRAVENOUS
  Filled 2018-03-02: qty 5

## 2018-03-18 MED FILL — OXYCODONE-APAP 10-325: 10-325 | 30 days supply | Qty: 60 | Fill #0

## 2018-04-16 MED FILL — OXYCODONE-APAP 10-325: 10-325 | 30 days supply | Qty: 60 | Fill #0

## 2018-04-20 MED FILL — LOSARTAN POTASSIUM 100 MG T: 100 | 90 days supply | Qty: 90 | Fill #1

## 2018-05-01 ENCOUNTER — Inpatient Hospital Stay: Payer: Medicare Other

## 2018-05-01 ENCOUNTER — Telehealth: Payer: Self-pay

## 2018-05-01 ENCOUNTER — Inpatient Hospital Stay: Payer: Medicare Other | Attending: Oncology | Admitting: Oncology

## 2018-05-01 VITALS — BP 140/86 | HR 64 | Temp 98.4°F | Resp 17 | Ht 73.5 in | Wt 162.9 lb

## 2018-05-01 DIAGNOSIS — C9001 Multiple myeloma in remission: Secondary | ICD-10-CM | POA: Diagnosis present

## 2018-05-01 DIAGNOSIS — C61 Malignant neoplasm of prostate: Secondary | ICD-10-CM

## 2018-05-01 DIAGNOSIS — Z79899 Other long term (current) drug therapy: Secondary | ICD-10-CM

## 2018-05-01 DIAGNOSIS — Z8546 Personal history of malignant neoplasm of prostate: Secondary | ICD-10-CM | POA: Insufficient documentation

## 2018-05-01 DIAGNOSIS — Z86718 Personal history of other venous thrombosis and embolism: Secondary | ICD-10-CM | POA: Diagnosis not present

## 2018-05-01 LAB — CBC WITH DIFFERENTIAL (CANCER CENTER ONLY)
ABS IMMATURE GRANULOCYTES: 0.01 10*3/uL (ref 0.00–0.07)
BASOS ABS: 0 10*3/uL (ref 0.0–0.1)
Basophils Relative: 1 %
EOS PCT: 1 %
Eosinophils Absolute: 0 10*3/uL (ref 0.0–0.5)
HEMATOCRIT: 40.8 % (ref 39.0–52.0)
HEMOGLOBIN: 13.8 g/dL (ref 13.0–17.0)
Immature Granulocytes: 0 %
LYMPHS ABS: 1.6 10*3/uL (ref 0.7–4.0)
LYMPHS PCT: 39 %
MCH: 31.9 pg (ref 26.0–34.0)
MCHC: 33.8 g/dL (ref 30.0–36.0)
MCV: 94.2 fL (ref 80.0–100.0)
MONO ABS: 0.4 10*3/uL (ref 0.1–1.0)
Monocytes Relative: 9 %
NEUTROS ABS: 2.1 10*3/uL (ref 1.7–7.7)
Neutrophils Relative %: 50 %
Platelet Count: 165 10*3/uL (ref 150–400)
RBC: 4.33 MIL/uL (ref 4.22–5.81)
RDW: 12.1 % (ref 11.5–15.5)
WBC Count: 4.1 10*3/uL (ref 4.0–10.5)
nRBC: 0 % (ref 0.0–0.2)

## 2018-05-01 LAB — CMP (CANCER CENTER ONLY)
ALBUMIN: 4.1 g/dL (ref 3.5–5.0)
ALK PHOS: 71 U/L (ref 38–126)
ALT: 21 U/L (ref 0–44)
ANION GAP: 9 (ref 5–15)
AST: 24 U/L (ref 15–41)
BILIRUBIN TOTAL: 0.5 mg/dL (ref 0.3–1.2)
BUN: 18 mg/dL (ref 8–23)
CALCIUM: 9.7 mg/dL (ref 8.9–10.3)
CO2: 25 mmol/L (ref 22–32)
CREATININE: 0.97 mg/dL (ref 0.61–1.24)
Chloride: 102 mmol/L (ref 98–111)
GFR, Est AFR Am: >60 mL/min (ref >60)
GFR, Estimated: >60 mL/min (ref >60)
GLUCOSE: 108 mg/dL — AB (ref 70–99)
Potassium: 4.6 mmol/L (ref 3.5–5.1)
SODIUM: 136 mmol/L (ref 135–145)
TOTAL PROTEIN: 7.8 g/dL (ref 6.5–8.1)

## 2018-05-01 MED ORDER — HEPARIN SOD (PORK) LOCK FLUSH 100 UNIT/ML IV SOLN
500.0000 [IU] | Freq: Once | INTRAVENOUS | Status: AC | PRN
Start: 2018-05-01 — End: 2018-05-01
  Administered 2018-05-01: 500 [IU] via INTRAVENOUS
  Filled 2018-05-01: qty 5

## 2018-05-01 MED ORDER — SODIUM CHLORIDE 0.9 % IJ SOLN
10.0000 mL | INTRAMUSCULAR | Status: DC | PRN
  Administered 2018-05-01: 10 mL via INTRAVENOUS
  Filled 2018-05-01: qty 10

## 2018-05-01 NOTE — Telephone Encounter (Signed)
Printed avs and calender of upcoming appointment. Per 10/18 los 

## 2018-05-01 NOTE — Progress Notes (Signed)
Hematology and Oncology Follow Up Visit  Travis Nguyen [id number] 1951-01-24 67 y.o. 05/01/2018 8:37 AM Travis Nguyen, Travis Brow, MD   Principle Diagnosis: 67 year old man with:  1. IgG kappa multiple myeloma after presenting with 23% plasma cell involvement in the bone marrow diagnosed in April of 2014.    2.  Prostate cancer diagnosed with TIc in May of 2014. PSA of 13.2,  Gleason score was 3+4 equals 7.  He remains in remission after prostatectomy.   Prior Therapy: He was treated initially with Cytoxan, Velcade and Decadron and subsequently his regimen changed to a Velcade, Revlimid with dexamethasone.  And has been in remission since 2015.  He is status post a robotic-assisted laparoscopic radical prostatectomy and bilateral lymph node dissection on 02/09/2014. The final pathology showed prostate adenocarcinoma Gleason score 4+3 equals 7 involving both lobes.    Current therapy: Active surveillance.  Interim History:  Travis Nguyen returns today for repeat evaluation.  Since last visit, he denies any recent complaints or hospitalizations.  He continues to perform activities of daily living without any decline.  Has performance status and quality of life remain excellent.  He denies any bone pain or pathological fractures.  He denies any urinary difficulties.  He denies any abdominal distention or early satiety.    Does not report any headaches or blurry vision or double vision. Does not report any alteration in mental status or confusion.  He denies any fevers or chills or sweats.  He does not report any chest pain, shortness of breath, palpitation orthopnea or PND. He does not report any shortness of breath or wheezing.  He denies any nausea, vomiting or hematochezia.  He denies any changes in bowel habits.  He does not report any skin rashes or lesions.  He does not report any lymphadenopathy or petechiae.  He denies any bleeding or clotting tendency.  Rest of his review of  systems is negative.  Medications: I have reviewed the patient's current medications.  Current Outpatient Medications  Medication Sig Dispense Refill  . HYDROcodone-acetaminophen (NORCO/VICODIN) 5-325 MG tablet Take 1 tablet by mouth every 6 (six) hours as needed for moderate pain. (Patient not taking: Reported on 11/12/2017) 60 tablet 0  . lidocaine-prilocaine (EMLA) cream APPLY TOPICALLY TO PORT-A-CATH DAILY AS NEEDED (Patient not taking: Reported on 11/12/2017) 30 g 1  . losartan (COZAAR) 100 MG tablet Take 100 mg by mouth every morning.     [name] oxyCODONE-acetaminophen (PERCOCET) 10-325 MG tablet Take 1 tablet by mouth as needed.  0  . polyethylene glycol (MIRALAX / GLYCOLAX) packet Take 17 g by mouth daily as needed for mild constipation. (Patient not taking: Reported on 07/03/2017) 14 each 0  . rivaroxaban (XARELTO) 20 MG TABS tablet Take 1 tablet (20 mg total) by mouth daily with supper. (Patient not taking: Reported on 11/12/2017) 30 tablet 0   No current facility-administered medications for this visit.      Allergies:  Allergies  Allergen Reactions  . Aspirin     Pt stated "raises BP"     Past Medical History, Surgical history, Social history, and Family History were reviewed and updated.   Physical Exam: Blood pressure 140/86, pulse 64, temperature 98.4 F (36.9 C), temperature source Oral, resp. rate 17, height 6' 1.5" (1.867 m), weight 162 lb 14.4 oz (73.9 kg), SpO2 99 %. ECOG: 1   General appearance: Comfortable appearing without any discomfort Head: Normocephalic without any trauma Oropharynx: Mucous membranes are moist and pink without any thrush or  ulcers. Eyes: Pupils are equal and round reactive to light. Lymph nodes: No cervical, supraclavicular, inguinal or axillary lymphadenopathy.   Heart:regular rate and rhythm.  S1 and S2 without leg edema. Lung: Clear without any rhonchi or wheezes.  No dullness to percussion. Abdomin: Soft, nontender, nondistended with good  bowel sounds.  No hepatosplenomegaly. Musculoskeletal: No joint deformity or effusion.  Full range of motion noted. Neurological: No deficits noted on motor, sensory and deep tendon reflex exam. Skin: No petechial rash or dryness.  Appeared moist.    Lab Results: Lab Results  Component Value Date   WBC 4.1 05/01/2018   HGB 13.8 05/01/2018   HCT 40.8 05/01/2018   MCV 94.2 05/01/2018   PLT 165 05/01/2018     Chemistry      Component Value Date/Time   NA 137 10/31/2017 0802   NA 138 03/27/2017 0838   K 4.3 10/31/2017 0802   K 4.3 03/27/2017 0838   CL 105 10/31/2017 0802   CL 105 01/01/2013 0835   CO2 26 10/31/2017 0802   CO2 24 03/27/2017 0838   BUN 12 10/31/2017 0802   BUN 23.7 03/27/2017 0838   CREATININE 0.84 10/31/2017 0802   CREATININE 1.2 03/27/2017 0838      Component Value Date/Time   CALCIUM 9.2 10/31/2017 0802   CALCIUM 9.9 03/27/2017 0838   ALKPHOS 67 10/31/2017 0802   ALKPHOS 79 03/27/2017 0838   AST 26 10/31/2017 0802   AST 25 03/27/2017 0838   ALT 24 10/31/2017 0802   ALT 26 03/27/2017 0838   BILITOT 0.4 10/31/2017 0802   BILITOT 0.31 03/27/2017 0838       Results for Travis Nguyen (MRN [id number]) as of 05/01/2018 08:33  Ref. Range 10/31/2017 08:01  Total Protein ELP Latest Ref Range: 6.0 - 8.5 g/dL 6.6  Albumin SerPl Elph-Mcnc Latest Ref Range: 2.9 - 4.4 g/dL 3.6  Albumin/Glob SerPl Latest Ref Range: 0.7 - 1.7  1.3  Alpha2 Glob SerPl Elph-Mcnc Latest Ref Range: 0.4 - 1.0 g/dL 0.7  Alpha 1 Latest Ref Range: 0.0 - 0.4 g/dL 0.2  Gamma Glob SerPl Elph-Mcnc Latest Ref Range: 0.4 - 1.8 g/dL 1.2  M Protein SerPl Elph-Mcnc Latest Ref Range: Not Observed g/dL Not Observed  IFE 1 Unknown Comment  Globulin, Total Latest Ref Range: 2.2 - 3.9 g/dL 3.0  B-Globulin SerPl Elph-Mcnc Latest Ref Range: 0.7 - 1.3 g/dL 1.0  IgG (Immunoglobin G), Serum Latest Ref Range: 700 - 1,600 mg/dL 1,119  IgM (Immunoglobulin M), Srm Latest Ref Range: 20 - 172 mg/dL 115   IgA Latest Ref Range: 61 - 437 mg/dL 201    Results for Travis Nguyen (MRN [id number]) as of 05/01/2018 08:33  Ref. Range 09/19/2016 08:33 10/31/2017 08:25  Prostate Specific Ag, Serum Latest Ref Range: 0.0 - 4.0 ng/mL <0.1 <0.1    Impression and Plan:  67 year old man with:   1. IgG Kappa Multiple Myeloma presented with the 20% plasma cell infiltration in the bone marrow diagnosed in 2014.  He continues to be in remission after the above mentioned therapy.  Protein studies obtained in October 2019 are currently pending protein studies in April 2019 continues to show complete response.  2.  Prostate cancer diagnosed in 2015.  He presented with stage T1c Gleason score 4+3 = 7 and underwent a radical prostatectomy.   3. Port-A-Cath flush: Port-A-Cath remains in place based on his wishes and will be flushed periodically.  4. Small bowel obstruction: No recent to small bowel  obstruction episodes at this time.  5. Deep vein thrombosis: He is off anticoagulation at this time without any recurrence.  6. Followup: Will be in 6 months.   15  minutes was spent with the patient face-to-face today.  More than 50% of time was dedicated to reviewing the natural course of his disease, differential diagnosis and coordinating plan of care.   Zola Button, MD 10/18/20198:37 AM

## 2018-05-01 NOTE — Telephone Encounter (Signed)
Error opening  

## 2018-05-04 LAB — MULTIPLE MYELOMA PANEL, SERUM
ALBUMIN/GLOB SERPL: 1.4 (ref 0.7–1.7)
ALPHA 1: 0.1 g/dL (ref 0.0–0.4)
Albumin SerPl Elph-Mcnc: 4.1 g/dL (ref 2.9–4.4)
Alpha2 Glob SerPl Elph-Mcnc: 0.7 g/dL (ref 0.4–1.0)
B-Globulin SerPl Elph-Mcnc: 0.9 g/dL (ref 0.7–1.3)
Gamma Glob SerPl Elph-Mcnc: 1.2 g/dL (ref 0.4–1.8)
Globulin, Total: 3 g/dL (ref 2.2–3.9)
IGA: 241 mg/dL (ref 61–437)
IGM (IMMUNOGLOBULIN M), SRM: 116 mg/dL (ref 20–172)
IgG (Immunoglobin G), Serum: 1280 mg/dL (ref 700–1600)
Total Protein ELP: 7.1 g/dL (ref 6.0–8.5)

## 2018-05-04 LAB — KAPPA/LAMBDA LIGHT CHAINS
KAPPA FREE LGHT CHN: 24.9 mg/L — AB (ref 3.3–19.4)
KAPPA, LAMDA LIGHT CHAIN RATIO: 1.77 — AB (ref 0.26–1.65)
Lambda free light chains: 14.1 mg/L (ref 5.7–26.3)

## 2018-05-15 MED FILL — OXYCODONE-APAP 10-325: 10-325 | 30 days supply | Qty: 60 | Fill #0

## 2018-06-15 MED FILL — OXYCODONE-APAP 10-325: 10-325 | 30 days supply | Qty: 60 | Fill #0

## 2018-06-23 ENCOUNTER — Other Ambulatory Visit: Payer: Self-pay

## 2018-06-23 MED ORDER — LIDOCAINE-PRILOCAINE 2.5-2.5 % EX CREA: TOPICAL_CREAM | CUTANEOUS | 1 refills | Status: DC

## 2018-06-23 MED FILL — LIDOCAINE-PRILOCAINE CREAM: 2.5-2.5 | 20 days supply | Qty: 30 | Fill #0

## 2018-06-26 ENCOUNTER — Inpatient Hospital Stay: Payer: Medicare Other | Attending: Oncology

## 2018-06-26 DIAGNOSIS — C61 Malignant neoplasm of prostate: Secondary | ICD-10-CM

## 2018-06-26 DIAGNOSIS — C9001 Multiple myeloma in remission: Secondary | ICD-10-CM | POA: Diagnosis present

## 2018-06-26 MED ORDER — HEPARIN SOD (PORK) LOCK FLUSH 100 UNIT/ML IV SOLN
500.0000 [IU] | Freq: Once | INTRAVENOUS | Status: AC | PRN
  Administered 2018-06-26: 500 [IU] via INTRAVENOUS
  Filled 2018-06-26: qty 5

## 2018-06-26 MED ORDER — SODIUM CHLORIDE 0.9% FLUSH
10.0000 mL | Freq: Once | INTRAVENOUS | Status: AC
  Administered 2018-06-26: 10 mL via INTRAVENOUS
  Filled 2018-06-26: qty 10

## 2018-06-26 NOTE — Patient Instructions (Signed)

## 2018-07-01 ENCOUNTER — Emergency Department (HOSPITAL_COMMUNITY)
Admission: EM | Admit: 2018-07-01 | Discharge: 2018-07-01 | Disposition: A | Discharge status: Home / Self Care | Payer: Medicare Other | Attending: Emergency Medicine | Admitting: Emergency Medicine

## 2018-07-01 ENCOUNTER — Emergency Department (HOSPITAL_COMMUNITY): Payer: Medicare Other

## 2018-07-01 ENCOUNTER — Encounter (HOSPITAL_COMMUNITY): Payer: Self-pay | Admitting: Emergency Medicine

## 2018-07-01 DIAGNOSIS — I10 Essential (primary) hypertension: Secondary | ICD-10-CM | POA: Insufficient documentation

## 2018-07-01 DIAGNOSIS — Z87891 Personal history of nicotine dependence: Secondary | ICD-10-CM | POA: Diagnosis not present

## 2018-07-01 DIAGNOSIS — J069 Acute upper respiratory infection, unspecified: Secondary | ICD-10-CM | POA: Diagnosis not present

## 2018-07-01 DIAGNOSIS — Z79899 Other long term (current) drug therapy: Secondary | ICD-10-CM | POA: Insufficient documentation

## 2018-07-01 DIAGNOSIS — R05 Cough: Secondary | ICD-10-CM | POA: Diagnosis present

## 2018-07-01 NOTE — ED Triage Notes (Signed)
Pt states he has had body aches, productive cough, and chills for a week. PT states he has been coughing up green mucus and has taken otc medication with no relief.

## 2018-07-01 NOTE — ED Notes (Signed)
Pt back from X-ray.  

## 2018-07-01 NOTE — Discharge Instructions (Addendum)
Make sure that you are getting plenty of rest and drinking a lot of fluids.  For fever or pain use Tylenol.  For cough use Robitussin-DM.  Check with your doctor if not better in 3 to 4 days.
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
## 2018-07-01 NOTE — ED Notes (Signed)
Patient verbalizes understanding of discharge instructions. Opportunity for questioning and answers were provided. Armband removed by staff, pt discharged from ED ambulatory.   

## 2018-07-01 NOTE — ED Notes (Signed)
Patient transported to X-ray 

## 2018-07-01 NOTE — ED Notes (Signed)
ED Provider at bedside. 

## 2018-07-13 MED FILL — OXYCODONE-APAP 10-325: 10-325 | 30 days supply | Qty: 60 | Fill #0

## 2018-08-11 MED FILL — OXYCODONE-APAP 10-325: 10-325 | 30 days supply | Qty: 60 | Fill #0

## 2018-08-19 MED FILL — LOSARTAN POTASSIUM 100 MG T: 100 | 30 days supply | Qty: 30 | Fill #0

## 2018-08-28 ENCOUNTER — Inpatient Hospital Stay: Payer: Medicare Other | Attending: Oncology

## 2018-08-28 DIAGNOSIS — Z452 Encounter for adjustment and management of vascular access device: Secondary | ICD-10-CM | POA: Insufficient documentation

## 2018-08-28 DIAGNOSIS — C9001 Multiple myeloma in remission: Secondary | ICD-10-CM | POA: Diagnosis present

## 2018-08-28 DIAGNOSIS — Z95828 Presence of other vascular implants and grafts: Secondary | ICD-10-CM | POA: Insufficient documentation

## 2018-08-28 DIAGNOSIS — C61 Malignant neoplasm of prostate: Secondary | ICD-10-CM

## 2018-08-28 MED ORDER — HEPARIN SOD (PORK) LOCK FLUSH 100 UNIT/ML IV SOLN
500.0000 [IU] | Freq: Once | INTRAVENOUS | Status: AC | PRN
  Administered 2018-08-28: 500 [IU]
  Filled 2018-08-28: qty 5

## 2018-08-28 MED ORDER — SODIUM CHLORIDE 0.9% FLUSH
10.0000 mL | INTRAVENOUS | Status: DC | PRN
  Administered 2018-08-28: 10 mL
  Filled 2018-08-28: qty 10

## 2018-09-09 MED FILL — OXYCODONE-APAP 10-325: 10-325 | 30 days supply | Qty: 60 | Fill #0

## 2018-10-05 MED FILL — LOSARTAN POTASSIUM 100 MG T: 100 | 90 days supply | Qty: 90 | Fill #1

## 2018-10-06 MED FILL — OXYCODONE-APAP 10-325: 10-325 | 30 days supply | Qty: 60 | Fill #0

## 2018-10-19 ENCOUNTER — Telehealth: Payer: Self-pay | Admitting: Oncology

## 2018-10-19 NOTE — Telephone Encounter (Signed)
Tried to reach regarding 4/21 and 4/28 phone visit

## 2018-10-30 ENCOUNTER — Other Ambulatory Visit: Payer: Self-pay | Admitting: Family Medicine

## 2018-10-30 DIAGNOSIS — Z87891 Personal history of nicotine dependence: Secondary | ICD-10-CM

## 2018-10-30 DIAGNOSIS — Z136 Encounter for screening for cardiovascular disorders: Secondary | ICD-10-CM

## 2018-11-03 ENCOUNTER — Inpatient Hospital Stay: Payer: Medicare Other

## 2018-11-03 ENCOUNTER — Ambulatory Visit: Payer: Medicare Other | Admitting: Oncology

## 2018-11-03 ENCOUNTER — Other Ambulatory Visit: Payer: Self-pay

## 2018-11-03 ENCOUNTER — Inpatient Hospital Stay: Payer: Medicare Other | Attending: Oncology

## 2018-11-03 DIAGNOSIS — Z95828 Presence of other vascular implants and grafts: Secondary | ICD-10-CM

## 2018-11-03 DIAGNOSIS — C61 Malignant neoplasm of prostate: Secondary | ICD-10-CM

## 2018-11-03 DIAGNOSIS — C9001 Multiple myeloma in remission: Secondary | ICD-10-CM | POA: Insufficient documentation

## 2018-11-03 LAB — CBC WITH DIFFERENTIAL (CANCER CENTER ONLY)
Abs Immature Granulocytes: 0.01 10*3/uL (ref 0.00–0.07)
Basophils Absolute: 0 10*3/uL (ref 0.0–0.1)
Basophils Relative: 1 %
Eosinophils Absolute: 0 10*3/uL (ref 0.0–0.5)
Eosinophils Relative: 1 %
HCT: 41.2 % (ref 39.0–52.0)
Hemoglobin: 13.6 g/dL (ref 13.0–17.0)
Immature Granulocytes: 0 %
Lymphocytes Relative: 35 %
Lymphs Abs: 1.5 10*3/uL (ref 0.7–4.0)
MCH: 31.3 pg (ref 26.0–34.0)
MCHC: 33 g/dL (ref 30.0–36.0)
MCV: 94.7 fL (ref 80.0–100.0)
Monocytes Absolute: 0.4 10*3/uL (ref 0.1–1.0)
Monocytes Relative: 10 %
Neutro Abs: 2.3 10*3/uL (ref 1.7–7.7)
Neutrophils Relative %: 53 %
Platelet Count: 145 10*3/uL — ABNORMAL LOW (ref 150–400)
RBC: 4.35 MIL/uL (ref 4.22–5.81)
RDW: 12.3 % (ref 11.5–15.5)
WBC Count: 4.3 10*3/uL (ref 4.0–10.5)
nRBC: 0 % (ref 0.0–0.2)

## 2018-11-03 LAB — CMP (CANCER CENTER ONLY)
ALT: 19 U/L (ref 0–44)
AST: 20 U/L (ref 15–41)
Albumin: 4.1 g/dL (ref 3.5–5.0)
Alkaline Phosphatase: 72 U/L (ref 38–126)
Anion gap: 9 (ref 5–15)
BUN: 14 mg/dL (ref 8–23)
CO2: 22 mmol/L (ref 22–32)
Calcium: 9.2 mg/dL (ref 8.9–10.3)
Chloride: 105 mmol/L (ref 98–111)
Creatinine: 0.84 mg/dL (ref 0.61–1.24)
GFR, Est AFR Am: >60 mL/min (ref >60)
GFR, Estimated: >60 mL/min (ref >60)
Glucose, Bld: 101 mg/dL — ABNORMAL HIGH (ref 70–99)
Potassium: 4.5 mmol/L (ref 3.5–5.1)
Sodium: 136 mmol/L (ref 135–145)
Total Bilirubin: 0.2 mg/dL — ABNORMAL LOW (ref 0.3–1.2)
Total Protein: 7.7 g/dL (ref 6.5–8.1)

## 2018-11-03 MED ORDER — HEPARIN SOD (PORK) LOCK FLUSH 100 UNIT/ML IV SOLN
500.0000 [IU] | Freq: Once | INTRAVENOUS | Status: AC | PRN
  Administered 2018-11-03: 09:00:00 500 [IU]
  Filled 2018-11-03: qty 5

## 2018-11-03 MED ORDER — SODIUM CHLORIDE 0.9% FLUSH
10.0000 mL | INTRAVENOUS | Status: DC | PRN
  Administered 2018-11-03: 10 mL
  Filled 2018-11-03: qty 10

## 2018-11-04 LAB — KAPPA/LAMBDA LIGHT CHAINS
Kappa free light chain: 20.8 mg/L — ABNORMAL HIGH (ref 3.3–19.4)
Kappa, lambda light chain ratio: 1.69 — ABNORMAL HIGH (ref 0.26–1.65)
Lambda free light chains: 12.3 mg/L (ref 5.7–26.3)

## 2018-11-04 LAB — MULTIPLE MYELOMA PANEL, SERUM
Albumin SerPl Elph-Mcnc: 3.9 g/dL (ref 2.9–4.4)
Albumin/Glob SerPl: 1.2 (ref 0.7–1.7)
Alpha 1: 0.2 g/dL (ref 0.0–0.4)
Alpha2 Glob SerPl Elph-Mcnc: 0.8 g/dL (ref 0.4–1.0)
B-Globulin SerPl Elph-Mcnc: 1 g/dL (ref 0.7–1.3)
Gamma Glob SerPl Elph-Mcnc: 1.3 g/dL (ref 0.4–1.8)
Globulin, Total: 3.3 g/dL (ref 2.2–3.9)
IgA: 220 mg/dL (ref 61–437)
IgG (Immunoglobin G), Serum: 1341 mg/dL (ref 603–1613)
IgM (Immunoglobulin M), Srm: 113 mg/dL (ref 20–172)
M Protein SerPl Elph-Mcnc: 0.3 g/dL — ABNORMAL HIGH
Total Protein ELP: 7.2 g/dL (ref 6.0–8.5)

## 2018-11-04 MED FILL — OXYCODONE-APAP 10-325: 10-325 | 30 days supply | Qty: 60 | Fill #0

## 2018-11-10 ENCOUNTER — Inpatient Hospital Stay (HOSPITAL_BASED_OUTPATIENT_CLINIC_OR_DEPARTMENT_OTHER): Payer: Medicare Other | Admitting: Oncology

## 2018-11-10 DIAGNOSIS — C61 Malignant neoplasm of prostate: Secondary | ICD-10-CM | POA: Diagnosis not present

## 2018-11-10 DIAGNOSIS — C9001 Multiple myeloma in remission: Secondary | ICD-10-CM

## 2018-11-10 NOTE — Progress Notes (Signed)
Hematology and Oncology Follow Up for Telemedicine Visits  Travis Nguyen [id number] 1950-10-02 68 y.o. 11/10/2018 8:06 AM [name], [name], MD   I connected with Travis Nguyen on 11/10/18 at  8:45 AM EDT by telephone visit and verified that I am speaking with the correct person using two identifiers.   I discussed the limitations, risks, security and privacy concerns of performing an evaluation and management service by telemedicine and the availability of in-person appointments. I also discussed with the patient that there may be a patient responsible charge related to this service. The patient expressed understanding and agreed to proceed.  Other persons participating in the visit and their role in the encounter: None  Patient's location: Home Provider's location: Office    Principle Diagnosis: 68 year old man with IgG kappa multiple myeloma diagnosed in April of 2014. he was found to have 23% plasma cell in the bone marrow.  He is also status post prostatectomy for history of prostate cancer 2014.   Prior Therapy: He was treated initially with Cytoxan, Velcade and Decadron and subsequently his regimen changed to a Velcade, Revlimid with dexamethasone.  And has been in remission since 2015.   Current therapy: Active surveillance  Interim History: Travis Nguyen feels reasonably well without any recent complaints.  He was seen in emergency department December for flulike symptoms which has resolved at this time.  He continues to exercise regularly walking 30 minutes a day.  He denies any back pain shoulder pain or pathological bone fractures.  Continues to eat well without any recurrent infections or neuropathy.     Medications: I have reviewed the patient's current medications.  Current Outpatient Medications  Medication Sig Dispense Refill  . HYDROcodone-acetaminophen (NORCO/VICODIN) 5-325 MG tablet Take 1 tablet by mouth every 6 (six) hours as needed for moderate  pain. (Patient not taking: Reported on 11/12/2017) 60 tablet 0  . lidocaine-prilocaine (EMLA) cream APPLY TOPICALLY TO PORT-A-CATH DAILY AS NEEDED (Patient not taking: Reported on 07/01/2018) 30 g 1  . losartan (COZAAR) 100 MG tablet Take 100 mg by mouth every morning.     [name] oxyCODONE-acetaminophen (PERCOCET) 10-325 MG tablet Take 1 tablet by mouth as needed.  0  . polyethylene glycol (MIRALAX / GLYCOLAX) packet Take 17 g by mouth daily as needed for mild constipation. (Patient not taking: Reported on 07/03/2017) 14 each 0  . rivaroxaban (XARELTO) 20 MG TABS tablet Take 1 tablet (20 mg total) by mouth daily with supper. (Patient not taking: Reported on 11/12/2017) 30 tablet 0   No current facility-administered medications for this visit.      Allergies:  Allergies  Allergen Reactions  . Aspirin     Pt stated "raises BP"     Past Medical History, Surgical history, Social history, and Family History were reviewed and updated.      Lab Results: Lab Results  Component Value Date   WBC 4.3 11/03/2018   HGB 13.6 11/03/2018   HCT 41.2 11/03/2018   MCV 94.7 11/03/2018   PLT 145 (L) 11/03/2018     Chemistry      Component Value Date/Time   NA 136 11/03/2018 0830   NA 138 03/27/2017 0838   K 4.5 11/03/2018 0830   K 4.3 03/27/2017 0838   CL 105 11/03/2018 0830   CL 105 01/01/2013 0835   CO2 22 11/03/2018 0830   CO2 24 03/27/2017 0838   BUN 14 11/03/2018 0830   BUN 23.7 03/27/2017 0838   CREATININE 0.84 11/03/2018 0830  CREATININE 1.2 03/27/2017 0838      Component Value Date/Time   CALCIUM 9.2 11/03/2018 0830   CALCIUM 9.9 03/27/2017 0838   ALKPHOS 72 11/03/2018 0830   ALKPHOS 79 03/27/2017 0838   AST 20 11/03/2018 0830   AST 25 03/27/2017 0838   ALT 19 11/03/2018 0830   ALT 26 03/27/2017 0838   BILITOT 0.2 (L) 11/03/2018 0830   BILITOT 0.31 03/27/2017 0838        Impression and Plan:  68 year old man with:   1.  Multiple myeloma that is IgG Kappa subtype  diagnosed in 2014 and has not required treatment since 2015.  Protein studies personally reviewed obtained in April 2020 and discussed with the patient today.  His protein studies continue to show his disease in remission.  The natural course of this disease was discussed and risk of relapse was assessed for the time being a recommended active surveillance.  He will have repeat testing in 6 months..  2.  Prostate cancer diagnosed in 2015.  He was found to have Stage T1c Gleason score 4+3 = 7 and status post prostatectomy.  No evidence of recurrent disease noted at this time.   3. Port-A-Cath flush: This will remain in place and be flushed periodically.  4. Followup: Will be in 6 months.   I discussed the assessment and treatment plan with the patient. The patient was provided an opportunity to ask questions and all were answered. The patient agreed with the plan and demonstrated an understanding of the instructions.   The patient was advised to call back or seek an in-person evaluation if the symptoms worsen or if the condition fails to improve as anticipated.  I provided 20 minutes of non face-to-face telephone visit time during this encounter, and > 50% was dedicated to reviewing his disease status, reviewing laboratory data and answering questions regarding future plan of care.  Zola Button, MD 11/10/2018 8:06 AM

## 2018-11-11 ENCOUNTER — Telehealth: Payer: Self-pay | Admitting: Oncology

## 2018-11-11 NOTE — Telephone Encounter (Signed)
Scheduled appt per 4/28 sch message. ° °A calendar will be mailed out. °

## 2018-12-02 MED FILL — OXYCODONE-APAP 10-325: 10-325 | 30 days supply | Qty: 60 | Fill #0

## 2018-12-22 ENCOUNTER — Ambulatory Visit
Admission: RE | Admit: 2018-12-22 | Discharge: 2018-12-22 | Disposition: A | Discharge status: Home / Self Care | Payer: Medicare Other | Source: Ambulatory Visit | Attending: Family Medicine | Admitting: Family Medicine

## 2018-12-22 DIAGNOSIS — Z87891 Personal history of nicotine dependence: Secondary | ICD-10-CM

## 2018-12-22 DIAGNOSIS — Z136 Encounter for screening for cardiovascular disorders: Secondary | ICD-10-CM

## 2018-12-31 MED FILL — OXYCODONE-APAP 10-325: 10-325 | 30 days supply | Qty: 60 | Fill #0

## 2019-01-11 ENCOUNTER — Other Ambulatory Visit: Payer: Self-pay

## 2019-01-11 ENCOUNTER — Inpatient Hospital Stay: Payer: Medicare Other | Attending: Oncology

## 2019-01-11 DIAGNOSIS — C61 Malignant neoplasm of prostate: Secondary | ICD-10-CM | POA: Diagnosis present

## 2019-01-11 DIAGNOSIS — Z452 Encounter for adjustment and management of vascular access device: Secondary | ICD-10-CM | POA: Diagnosis present

## 2019-01-11 DIAGNOSIS — C9001 Multiple myeloma in remission: Secondary | ICD-10-CM

## 2019-01-11 DIAGNOSIS — Z95828 Presence of other vascular implants and grafts: Secondary | ICD-10-CM

## 2019-01-11 MED ORDER — SODIUM CHLORIDE 0.9% FLUSH
10.0000 mL | INTRAVENOUS | Status: DC | PRN
  Administered 2019-01-11: 10 mL
  Filled 2019-01-11: qty 10

## 2019-01-11 MED ORDER — HEPARIN SOD (PORK) LOCK FLUSH 100 UNIT/ML IV SOLN
500.0000 [IU] | Freq: Once | INTRAVENOUS | Status: AC | PRN
  Administered 2019-01-11: 500 [IU]
  Filled 2019-01-11: qty 5

## 2019-01-21 MED FILL — LOSARTAN POTASSIUM 100 MG T: 100 | 30 days supply | Qty: 30 | Fill #2

## 2019-01-28 MED FILL — OXYCODONE-APAP 10-325: 10-325 | 30 days supply | Qty: 60 | Fill #0

## 2019-02-18 MED FILL — LOSARTAN POTASSIUM 100 MG T: 100 | 30 days supply | Qty: 30 | Fill #3

## 2019-02-25 MED FILL — OXYCODONE-APAP 10-325: 10-325 | 30 days supply | Qty: 60 | Fill #0

## 2019-03-12 ENCOUNTER — Inpatient Hospital Stay: Payer: Medicare Other | Attending: Oncology

## 2019-03-12 ENCOUNTER — Other Ambulatory Visit: Payer: Self-pay

## 2019-03-12 DIAGNOSIS — C9001 Multiple myeloma in remission: Secondary | ICD-10-CM | POA: Insufficient documentation

## 2019-03-12 DIAGNOSIS — C61 Malignant neoplasm of prostate: Secondary | ICD-10-CM | POA: Diagnosis present

## 2019-03-12 DIAGNOSIS — Z79899 Other long term (current) drug therapy: Secondary | ICD-10-CM | POA: Insufficient documentation

## 2019-03-12 DIAGNOSIS — Z95828 Presence of other vascular implants and grafts: Secondary | ICD-10-CM

## 2019-03-12 DIAGNOSIS — Z452 Encounter for adjustment and management of vascular access device: Secondary | ICD-10-CM | POA: Diagnosis not present

## 2019-03-12 MED ORDER — HEPARIN SOD (PORK) LOCK FLUSH 100 UNIT/ML IV SOLN
500.0000 [IU] | Freq: Once | INTRAVENOUS | Status: AC | PRN
  Administered 2019-03-12: 500 [IU]
  Filled 2019-03-12: qty 5

## 2019-03-12 MED ORDER — SODIUM CHLORIDE 0.9% FLUSH
10.0000 mL | INTRAVENOUS | Status: DC | PRN
  Administered 2019-03-12: 10 mL
  Filled 2019-03-12: qty 10

## 2019-03-12 NOTE — Patient Instructions (Signed)

## 2019-03-23 MED FILL — LOSARTAN POTASSIUM 100 MG T: 100 | 30 days supply | Qty: 30 | Fill #0

## 2019-03-26 MED FILL — OXYCODONE-APAP 10-325: 10-325 | 30 days supply | Qty: 60 | Fill #0

## 2019-04-22 MED FILL — LOSARTAN POTASSIUM 100 MG T: 100 | 30 days supply | Qty: 30 | Fill #1

## 2019-04-23 MED FILL — OXYCODONE-APAP 10-325: 10-325 | 30 days supply | Qty: 60 | Fill #0

## 2019-05-12 ENCOUNTER — Inpatient Hospital Stay: Payer: Medicare Other

## 2019-05-12 ENCOUNTER — Inpatient Hospital Stay: Payer: Medicare Other | Attending: Oncology

## 2019-05-12 ENCOUNTER — Other Ambulatory Visit: Payer: Self-pay

## 2019-05-12 VITALS — BP 138/76 | HR 59 | Temp 97.8°F | Resp 16

## 2019-05-12 DIAGNOSIS — C9001 Multiple myeloma in remission: Secondary | ICD-10-CM

## 2019-05-12 DIAGNOSIS — C61 Malignant neoplasm of prostate: Secondary | ICD-10-CM | POA: Diagnosis present

## 2019-05-12 DIAGNOSIS — Z95828 Presence of other vascular implants and grafts: Secondary | ICD-10-CM

## 2019-05-12 LAB — CBC WITH DIFFERENTIAL (CANCER CENTER ONLY)
Abs Immature Granulocytes: 0.01 10*3/uL (ref 0.00–0.07)
Basophils Absolute: 0 10*3/uL (ref 0.0–0.1)
Basophils Relative: 0 %
Eosinophils Absolute: 0 10*3/uL (ref 0.0–0.5)
Eosinophils Relative: 1 %
HCT: 37.7 % — ABNORMAL LOW (ref 39.0–52.0)
Hemoglobin: 12.5 g/dL — ABNORMAL LOW (ref 13.0–17.0)
Immature Granulocytes: 0 %
Lymphocytes Relative: 37 %
Lymphs Abs: 1.5 10*3/uL (ref 0.7–4.0)
MCH: 31.1 pg (ref 26.0–34.0)
MCHC: 33.2 g/dL (ref 30.0–36.0)
MCV: 93.8 fL (ref 80.0–100.0)
Monocytes Absolute: 0.3 10*3/uL (ref 0.1–1.0)
Monocytes Relative: 8 %
Neutro Abs: 2.2 10*3/uL (ref 1.7–7.7)
Neutrophils Relative %: 54 %
Platelet Count: 169 10*3/uL (ref 150–400)
RBC: 4.02 MIL/uL — ABNORMAL LOW (ref 4.22–5.81)
RDW: 12.2 % (ref 11.5–15.5)
WBC Count: 4.1 10*3/uL (ref 4.0–10.5)
nRBC: 0 % (ref 0.0–0.2)

## 2019-05-12 LAB — CMP (CANCER CENTER ONLY)
ALT: 19 U/L (ref 0–44)
AST: 18 U/L (ref 15–41)
Albumin: 4 g/dL (ref 3.5–5.0)
Alkaline Phosphatase: 77 U/L (ref 38–126)
Anion gap: 11 (ref 5–15)
BUN: 18 mg/dL (ref 8–23)
CO2: 25 mmol/L (ref 22–32)
Calcium: 9.2 mg/dL (ref 8.9–10.3)
Chloride: 104 mmol/L (ref 98–111)
Creatinine: 1.03 mg/dL (ref 0.61–1.24)
GFR, Est AFR Am: >60 mL/min (ref >60)
GFR, Estimated: >60 mL/min (ref >60)
Glucose, Bld: 99 mg/dL (ref 70–99)
Potassium: 4.4 mmol/L (ref 3.5–5.1)
Sodium: 140 mmol/L (ref 135–145)
Total Bilirubin: 0.3 mg/dL (ref 0.3–1.2)
Total Protein: 7.5 g/dL (ref 6.5–8.1)

## 2019-05-12 MED ORDER — SODIUM CHLORIDE 0.9% FLUSH
10.0000 mL | INTRAVENOUS | Status: DC | PRN
  Administered 2019-05-12: 10 mL
  Filled 2019-05-12: qty 10

## 2019-05-12 MED ORDER — HEPARIN SOD (PORK) LOCK FLUSH 100 UNIT/ML IV SOLN
500.0000 [IU] | Freq: Once | INTRAVENOUS | Status: AC | PRN
  Administered 2019-05-12: 500 [IU]
  Filled 2019-05-12: qty 5

## 2019-05-13 LAB — KAPPA/LAMBDA LIGHT CHAINS
Kappa free light chain: 20.1 mg/L — ABNORMAL HIGH (ref 3.3–19.4)
Kappa, lambda light chain ratio: 1.7 — ABNORMAL HIGH (ref 0.26–1.65)
Lambda free light chains: 11.8 mg/L (ref 5.7–26.3)

## 2019-05-13 LAB — PROSTATE-SPECIFIC AG, SERUM (LABCORP): Prostate Specific Ag, Serum: <0.1 ng/mL (ref 0.0–4.0)

## 2019-05-17 LAB — MULTIPLE MYELOMA PANEL, SERUM
Albumin SerPl Elph-Mcnc: 3.9 g/dL (ref 2.9–4.4)
Albumin/Glob SerPl: 1.3 (ref 0.7–1.7)
Alpha 1: 0.2 g/dL (ref 0.0–0.4)
Alpha2 Glob SerPl Elph-Mcnc: 0.8 g/dL (ref 0.4–1.0)
B-Globulin SerPl Elph-Mcnc: 0.9 g/dL (ref 0.7–1.3)
Gamma Glob SerPl Elph-Mcnc: 1.3 g/dL (ref 0.4–1.8)
Globulin, Total: 3.2 g/dL (ref 2.2–3.9)
IgA: 137 mg/dL (ref 61–437)
IgG (Immunoglobin G), Serum: 1465 mg/dL (ref 603–1613)
IgM (Immunoglobulin M), Srm: 68 mg/dL (ref 20–172)
M Protein SerPl Elph-Mcnc: 0.6 g/dL — ABNORMAL HIGH
Total Protein ELP: 7.1 g/dL (ref 6.0–8.5)

## 2019-05-19 ENCOUNTER — Other Ambulatory Visit: Payer: Self-pay

## 2019-05-19 ENCOUNTER — Inpatient Hospital Stay: Payer: Medicare Other | Attending: Oncology | Admitting: Oncology

## 2019-05-19 VITALS — BP 137/82 | HR 70 | Temp 98.7°F | Resp 17 | Ht 73.5 in | Wt 168.7 lb

## 2019-05-19 DIAGNOSIS — Z7901 Long term (current) use of anticoagulants: Secondary | ICD-10-CM | POA: Diagnosis not present

## 2019-05-19 DIAGNOSIS — C61 Malignant neoplasm of prostate: Secondary | ICD-10-CM | POA: Insufficient documentation

## 2019-05-19 DIAGNOSIS — Z886 Allergy status to analgesic agent status: Secondary | ICD-10-CM | POA: Diagnosis not present

## 2019-05-19 DIAGNOSIS — C9001 Multiple myeloma in remission: Secondary | ICD-10-CM

## 2019-05-19 DIAGNOSIS — Z79899 Other long term (current) drug therapy: Secondary | ICD-10-CM | POA: Diagnosis not present

## 2019-05-19 NOTE — Progress Notes (Signed)
Hematology and Oncology Follow Up Visit  Travis Nguyen [id number] [birth year] 68 y.o. 05/19/2019 7:54 AM Travis Nguyen Travis Nguyen, Travis Brow, MD   Principle Diagnosis: 68 year old man with:  1.  Multiple myeloma diagnosed in 2014.  He presented with IgG kappa subtype and 23% plasma cell bone marrow involvement.    2.  Stage a T1c, Gleason score 3+4 = 7 prostate cancer diagnosed in 2014.  Prior Therapy: He was treated initially with Cytoxan, Velcade and Decadron and subsequently his regimen changed to a Velcade, Revlimid with dexamethasone.  And has been in remission since 2015.  He is status post a robotic-assisted laparoscopic radical prostatectomy and bilateral lymph node dissection on 02/09/2014. The final pathology showed prostate adenocarcinoma Gleason score 4+3 equals 7 involving both lobes.    Current therapy: Active surveillance.  Interim History:  Travis Nguyen is here for a follow-up visit.  Since the last visit, he reports no major changes in his health.  He continues to feel well without any recent hospitalizations or illnesses.  Denies any abdominal pain discomfort.  He denies any pathological fractures or bone pain.  He remains ambulatory and continues to attend to activities of daily living.  Patient denied any alteration mental status, neuropathy, confusion or dizziness.  Denies any headaches or lethargy.  Denies any night sweats, weight loss or changes in appetite.  Denied orthopnea, dyspnea on exertion or chest discomfort.  Denies shortness of breath, difficulty breathing hemoptysis or cough.  Denies any abdominal distention, nausea, early satiety or dyspepsia.  Denies any hematuria, frequency, dysuria or nocturia.  Denies any skin irritation, dryness or rash.  Denies any ecchymosis or petechiae.  Denies any lymphadenopathy or clotting.  Denies any heat or cold intolerance.  Denies any anxiety or depression.  Remaining review of system is negative.      Medications: I have  reviewed the patient's current medications.  Current Outpatient Medications  Medication Sig Dispense Refill  . HYDROcodone-acetaminophen (NORCO/VICODIN) 5-325 MG tablet Take 1 tablet by mouth every 6 (six) hours as needed for moderate pain. (Patient not taking: Reported on 11/12/2017) 60 tablet 0  . lidocaine-prilocaine (EMLA) cream APPLY TOPICALLY TO PORT-A-CATH DAILY AS NEEDED (Patient not taking: Reported on 07/01/2018) 30 g 1  . losartan (COZAAR) 100 MG tablet Take 100 mg by mouth every morning.     [name] oxyCODONE-acetaminophen (PERCOCET) 10-325 MG tablet Take 1 tablet by mouth as needed.  0  . polyethylene glycol (MIRALAX / GLYCOLAX) packet Take 17 g by mouth daily as needed for mild constipation. (Patient not taking: Reported on 07/03/2017) 14 each 0  . rivaroxaban (XARELTO) 20 MG TABS tablet Take 1 tablet (20 mg total) by mouth daily with supper. (Patient not taking: Reported on 11/12/2017) 30 tablet 0   No current facility-administered medications for this visit.      Allergies:  Allergies  Allergen Reactions  . Aspirin     Pt stated "raises BP"     Past Medical History, Surgical history, Social history, and Family History were reviewed and updated.   Physical Exam: Blood pressure 137/82, pulse 70, temperature 98.7 F (37.1 C), resp. rate 17, height 6' 1.5" (1.867 m), weight 168 lb 11.2 oz (76.5 kg), SpO2 100 %.   ECOG: 1     General appearance: Alert, awake without any distress. Head: Atraumatic without abnormalities Oropharynx: Without any thrush or ulcers. Eyes: No scleral icterus. Lymph nodes: No lymphadenopathy noted in the cervical, supraclavicular, or axillary nodes Heart:regular rate and rhythm, without any murmurs  or gallops.   Lung: Clear to auscultation without any rhonchi, wheezes or dullness to percussion. Abdomin: Soft, nontender without any shifting dullness or ascites. Musculoskeletal: No clubbing or cyanosis. Neurological: No motor or sensory  deficits. Skin: No rashes or lesions. Psychiatric: Mood and affect appeared normal.    Lab Results: Lab Results  Component Value Date   WBC 4.1 05/12/2019   HGB 12.5 (L) 05/12/2019   HCT 37.7 (L) 05/12/2019   MCV 93.8 05/12/2019   PLT 169 05/12/2019     Chemistry      Component Value Date/Time   NA 140 05/12/2019 0824   NA 138 03/27/2017 0838   K 4.4 05/12/2019 0824   K 4.3 03/27/2017 0838   CL 104 05/12/2019 0824   CL 105 01/01/2013 0835   CO2 25 05/12/2019 0824   CO2 24 03/27/2017 0838   BUN 18 05/12/2019 0824   BUN 23.7 03/27/2017 0838   CREATININE 1.03 05/12/2019 0824   CREATININE 1.2 03/27/2017 0838      Component Value Date/Time   CALCIUM 9.2 05/12/2019 0824   CALCIUM 9.9 03/27/2017 0838   ALKPHOS 77 05/12/2019 0824   ALKPHOS 79 03/27/2017 0838   AST 18 05/12/2019 0824   AST 25 03/27/2017 0838   ALT 19 05/12/2019 0824   ALT 26 03/27/2017 0838   BILITOT 0.3 05/12/2019 0824   BILITOT 0.31 03/27/2017 0838      Results for Travis Nguyen, Travis Nguyen (MRN [id number]) as of 05/19/2019 07:56  Ref. Range 05/01/2018 07:45 11/03/2018 08:30 05/12/2019 08:24  M Protein SerPl Elph-Mcnc Latest Ref Range: Not Observed g/dL Not Observed 0.3 (H) 0.6 (H)  IFE 1 Unknown Comment Comment Comment (A)  Globulin, Total Latest Ref Range: 2.2 - 3.9 g/dL 3.0 3.3 3.2  B-Globulin SerPl Elph-Mcnc Latest Ref Range: 0.7 - 1.3 g/dL 0.9 1.0 0.9  IgG (Immunoglobin G), Serum Latest Ref Range: 603 - 1,613 mg/dL 1,280 1,341 1,465  IgM (Immunoglobulin M), Srm Latest Ref Range: 20 - 172 mg/dL 116 113 68  IgA Latest Ref Range: 61 - 437 mg/dL 241 220 137      Impression and Plan:  68 year old man with:   1.  Multiple myeloma diagnosed in 2014.  He was found to have 20% plasma cell involvement in the bone marrow.   Protein studies obtained on 9 05/12/2019 personally reviewed and showed a slight increase in his M spike up to 0.6 g/dL.  His IgG level is normal with normal electrolytes and a normal  CBC.  Based on these findings, it appears that he may be developing relapsed plasma cell disorder although in the form of MGUS, smoldering myeloma or symptomatic multiple myeloma.  At this time he has no symptoms or endorgan damage to warrant any treatment and I have recommended active surveillance.  Salvage options were reiterated at this time which include Revlimid-based therapy he has not been exposed to.  Other options include daratumumab as well as retreatment with Velcade based options.  I reiterated indication for treatment at this time which include worsening renal insufficiency, anemia or bone pain among others.  At this time I recommended the close observation and hold off on any treatment for the time being.  He is agreeable at this time.   2.  T1c Gleason score 7 prostate cancer diagnosed in 2015.  He remains in remission after prostatectomy with a PSA remains undetectable on May 12, 2019.   3.  IV access: Port-A-Cath remains in place and continue to be flushed  periodically.   4. Followup: He will return in 6 months for repeat evaluation.  25  minutes was spent with the patient face-to-face today.  More than 50% of time was spent on updating his disease status, reviewing laboratory data, treatment options as well as indication for treatment.   Zola Button, MD 11/4/20207:54 AM

## 2019-05-20 ENCOUNTER — Telehealth: Payer: Self-pay | Admitting: Oncology

## 2019-05-20 NOTE — Telephone Encounter (Signed)
Scheduled appt per 11/4 los.  Sent a staff message to get a calendar mailed out.

## 2019-05-24 MED FILL — LOSARTAN POTASSIUM 100 MG T: 100 | 90 days supply | Qty: 90 | Fill #0

## 2019-05-31 ENCOUNTER — Emergency Department (HOSPITAL_COMMUNITY)
Admission: EM | Admit: 2019-05-31 | Discharge: 2019-05-31 | Disposition: A | Discharge status: Home / Self Care | Payer: Medicare Other | Attending: Emergency Medicine | Admitting: Emergency Medicine

## 2019-05-31 ENCOUNTER — Encounter (HOSPITAL_COMMUNITY): Payer: Self-pay

## 2019-05-31 ENCOUNTER — Emergency Department (HOSPITAL_COMMUNITY): Payer: Medicare Other

## 2019-05-31 ENCOUNTER — Other Ambulatory Visit: Payer: Self-pay

## 2019-05-31 DIAGNOSIS — I10 Essential (primary) hypertension: Secondary | ICD-10-CM | POA: Diagnosis not present

## 2019-05-31 DIAGNOSIS — R05 Cough: Secondary | ICD-10-CM | POA: Insufficient documentation

## 2019-05-31 DIAGNOSIS — Z79899 Other long term (current) drug therapy: Secondary | ICD-10-CM | POA: Diagnosis not present

## 2019-05-31 DIAGNOSIS — Z20828 Contact with and (suspected) exposure to other viral communicable diseases: Secondary | ICD-10-CM | POA: Diagnosis not present

## 2019-05-31 DIAGNOSIS — Z87891 Personal history of nicotine dependence: Secondary | ICD-10-CM | POA: Diagnosis not present

## 2019-05-31 DIAGNOSIS — Z8546 Personal history of malignant neoplasm of prostate: Secondary | ICD-10-CM | POA: Insufficient documentation

## 2019-05-31 DIAGNOSIS — R059 Cough, unspecified: Secondary | ICD-10-CM

## 2019-05-31 DIAGNOSIS — Z86718 Personal history of other venous thrombosis and embolism: Secondary | ICD-10-CM | POA: Diagnosis not present

## 2019-05-31 NOTE — Discharge Instructions (Signed)
Your covid test is pending 

## 2019-05-31 NOTE — ED Triage Notes (Signed)
Pt presents with a cough x 1 week. Pt reports that his landlord tested positive for covid, and he has been exposed.
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
## 2019-06-01 LAB — SARS CORONAVIRUS 2 (TAT 6-24 HRS): SARS Coronavirus 2: NEGATIVE

## 2019-06-21 MED FILL — OXYCODONE-APAP 10-325: 10-325 | 30 days supply | Qty: 60 | Fill #0

## 2019-07-19 ENCOUNTER — Inpatient Hospital Stay: Payer: Medicare Other | Attending: Oncology

## 2019-07-19 ENCOUNTER — Other Ambulatory Visit: Payer: Self-pay

## 2019-07-19 DIAGNOSIS — Z452 Encounter for adjustment and management of vascular access device: Secondary | ICD-10-CM | POA: Insufficient documentation

## 2019-07-19 DIAGNOSIS — C61 Malignant neoplasm of prostate: Secondary | ICD-10-CM | POA: Diagnosis present

## 2019-07-19 DIAGNOSIS — Z95828 Presence of other vascular implants and grafts: Secondary | ICD-10-CM

## 2019-07-19 DIAGNOSIS — C9001 Multiple myeloma in remission: Secondary | ICD-10-CM | POA: Insufficient documentation

## 2019-07-19 MED ORDER — SODIUM CHLORIDE 0.9% FLUSH
10.0000 mL | INTRAVENOUS | Status: DC | PRN
  Administered 2019-07-19: 10 mL
  Filled 2019-07-19: qty 10

## 2019-07-19 MED ORDER — HEPARIN SOD (PORK) LOCK FLUSH 100 UNIT/ML IV SOLN
500.0000 [IU] | Freq: Once | INTRAVENOUS | Status: AC | PRN
  Administered 2019-07-19: 500 [IU]
  Filled 2019-07-19: qty 5

## 2019-07-19 NOTE — Patient Instructions (Signed)

## 2019-07-20 MED FILL — OXYCODONE-APAP 10-325: 10-325 | 30 days supply | Qty: 60 | Fill #0

## 2019-08-17 MED FILL — OXYCODONE-APAP 10-325: 10-325 | 30 days supply | Qty: 60 | Fill #0

## 2019-09-07 MED FILL — LOSARTAN POTASSIUM 100 MG T: 100 | 90 days supply | Qty: 90 | Fill #1

## 2019-09-13 ENCOUNTER — Ambulatory Visit: Payer: Medicare Other | Attending: Internal Medicine

## 2019-09-13 DIAGNOSIS — Z23 Encounter for immunization: Secondary | ICD-10-CM

## 2019-09-13 NOTE — Progress Notes (Signed)
   Covid-19 Vaccination Clinic  Name:  HALE HARTIGAN    MRN: [id number] DOB: 25-Jul-1950  09/13/2019  Mr. Klym was observed post Covid-19 immunization for 15 minutes without incidence. He was provided with Vaccine Information Sheet and instruction to access the V-Safe system.   Mr. Boyette was instructed to call 911 with any severe reactions post vaccine: [name] Difficulty breathing  . Swelling of your face and throat  . A fast heartbeat  . A bad rash all over your body  . Dizziness and weakness    Immunizations Administered    Name Date Dose VIS Date Route   Pfizer COVID-19 Vaccine 09/13/2019 11:17 AM 0.3 mL 06/25/2019 Intramuscular   Manufacturer: [place]   Lot: [id number]   [place]: [id number]

## 2019-09-15 MED FILL — OXYCODONE-APAP 10-325: 10-325 | 30 days supply | Qty: 60 | Fill #0

## 2019-09-16 ENCOUNTER — Inpatient Hospital Stay: Payer: Medicare Other | Attending: Oncology

## 2019-09-16 ENCOUNTER — Other Ambulatory Visit: Payer: Self-pay

## 2019-09-16 DIAGNOSIS — C9001 Multiple myeloma in remission: Secondary | ICD-10-CM | POA: Insufficient documentation

## 2019-09-16 DIAGNOSIS — Z95828 Presence of other vascular implants and grafts: Secondary | ICD-10-CM

## 2019-09-16 DIAGNOSIS — C61 Malignant neoplasm of prostate: Secondary | ICD-10-CM | POA: Diagnosis present

## 2019-09-16 DIAGNOSIS — Z452 Encounter for adjustment and management of vascular access device: Secondary | ICD-10-CM | POA: Diagnosis not present

## 2019-09-16 MED ORDER — HEPARIN SOD (PORK) LOCK FLUSH 100 UNIT/ML IV SOLN
500.0000 [IU] | Freq: Once | INTRAVENOUS | Status: AC
  Administered 2019-09-16: 500 [IU] via INTRAVENOUS
  Filled 2019-09-16: qty 5

## 2019-09-16 MED ORDER — SODIUM CHLORIDE 0.9% FLUSH
10.0000 mL | INTRAVENOUS | Status: DC | PRN
  Administered 2019-09-16: 12:00:00 10 mL via INTRAVENOUS
  Filled 2019-09-16: qty 10

## 2019-10-12 ENCOUNTER — Ambulatory Visit: Payer: Medicare Other | Attending: Internal Medicine

## 2019-10-12 DIAGNOSIS — Z23 Encounter for immunization: Secondary | ICD-10-CM

## 2019-10-12 NOTE — Progress Notes (Signed)
   Covid-19 Vaccination Clinic  Name:  Travis Nguyen    MRN: [id number] DOB: Jan 18, 1951  10/12/2019  Mr. Leak was observed post Covid-19 immunization for 15 minutes without incident. He was provided with Vaccine Information Sheet and instruction to access the V-Safe system.   [name] was instructed to call 911 with any severe reactions post vaccine: [name] Difficulty breathing  . Swelling of face and throat  . A fast heartbeat  . A bad rash all over body  . Dizziness and weakness   Immunizations Administered    Name Date Dose VIS Date Route   Pfizer COVID-19 Vaccine 10/12/2019 11:00 AM 0.3 mL 06/25/2019 Intramuscular   Manufacturer: [place]   Lot: [id number]   [place]: [id number]

## 2019-10-14 MED FILL — OXYCODONE-APAP 10-325: 10-325 | 30 days supply | Qty: 60 | Fill #0

## 2019-10-21 MED FILL — oxyCODONE HCL 5 MG TABS: 5 | 5 days supply | Qty: 30 | Fill #0

## 2019-11-07 ENCOUNTER — Other Ambulatory Visit: Payer: Self-pay

## 2019-11-07 ENCOUNTER — Encounter (HOSPITAL_COMMUNITY): Payer: Self-pay | Admitting: Emergency Medicine

## 2019-11-07 ENCOUNTER — Emergency Department (HOSPITAL_COMMUNITY)
Admission: EM | Admit: 2019-11-07 | Discharge: 2019-11-07 | Disposition: A | Discharge status: Home / Self Care | Payer: Medicare Other | Attending: Emergency Medicine | Admitting: Emergency Medicine

## 2019-11-07 DIAGNOSIS — R5383 Other fatigue: Secondary | ICD-10-CM | POA: Diagnosis not present

## 2019-11-07 DIAGNOSIS — M7918 Myalgia, other site: Secondary | ICD-10-CM | POA: Diagnosis not present

## 2019-11-07 DIAGNOSIS — I10 Essential (primary) hypertension: Secondary | ICD-10-CM | POA: Insufficient documentation

## 2019-11-07 DIAGNOSIS — R52 Pain, unspecified: Secondary | ICD-10-CM

## 2019-11-07 DIAGNOSIS — R0981 Nasal congestion: Secondary | ICD-10-CM

## 2019-11-07 DIAGNOSIS — J029 Acute pharyngitis, unspecified: Secondary | ICD-10-CM | POA: Diagnosis not present

## 2019-11-07 DIAGNOSIS — Z79899 Other long term (current) drug therapy: Secondary | ICD-10-CM | POA: Diagnosis not present

## 2019-11-07 DIAGNOSIS — C9001 Multiple myeloma in remission: Secondary | ICD-10-CM | POA: Diagnosis not present

## 2019-11-07 DIAGNOSIS — Z8546 Personal history of malignant neoplasm of prostate: Secondary | ICD-10-CM | POA: Diagnosis not present

## 2019-11-07 LAB — BASIC METABOLIC PANEL
Anion gap: 8 (ref 5–15)
BUN: 13 mg/dL (ref 8–23)
CO2: 25 mmol/L (ref 22–32)
Calcium: 9.1 mg/dL (ref 8.9–10.3)
Chloride: 102 mmol/L (ref 98–111)
Creatinine, Ser: 0.92 mg/dL (ref 0.61–1.24)
GFR calc Af Amer: >60 mL/min (ref >60)
GFR calc non Af Amer: >60 mL/min (ref >60)
Glucose, Bld: 97 mg/dL (ref 70–99)
Potassium: 4.1 mmol/L (ref 3.5–5.1)
Sodium: 135 mmol/L (ref 135–145)

## 2019-11-07 LAB — URINALYSIS, ROUTINE W REFLEX MICROSCOPIC
Bilirubin Urine: NEGATIVE
Glucose, UA: NEGATIVE mg/dL
Hgb urine dipstick: NEGATIVE
Ketones, ur: NEGATIVE mg/dL
Leukocytes,Ua: NEGATIVE
Nitrite: NEGATIVE
Protein, ur: NEGATIVE mg/dL
Specific Gravity, Urine: 1.003 — ABNORMAL LOW (ref 1.005–1.030)
pH: 7 (ref 5.0–8.0)

## 2019-11-07 LAB — CBC WITH DIFFERENTIAL/PLATELET
Abs Immature Granulocytes: 0.01 10*3/uL (ref 0.00–0.07)
Basophils Absolute: 0 10*3/uL (ref 0.0–0.1)
Basophils Relative: 0 %
Eosinophils Absolute: 0 10*3/uL (ref 0.0–0.5)
Eosinophils Relative: 0 %
HCT: 37.3 % — ABNORMAL LOW (ref 39.0–52.0)
Hemoglobin: 12.3 g/dL — ABNORMAL LOW (ref 13.0–17.0)
Immature Granulocytes: 0 %
Lymphocytes Relative: 22 %
Lymphs Abs: 0.8 10*3/uL (ref 0.7–4.0)
MCH: 30.7 pg (ref 26.0–34.0)
MCHC: 33 g/dL (ref 30.0–36.0)
MCV: 93 fL (ref 80.0–100.0)
Monocytes Absolute: 0.1 10*3/uL (ref 0.1–1.0)
Monocytes Relative: 4 %
Neutro Abs: 2.6 10*3/uL (ref 1.7–7.7)
Neutrophils Relative %: 74 %
Platelets: 178 10*3/uL (ref 150–400)
RBC: 4.01 MIL/uL — ABNORMAL LOW (ref 4.22–5.81)
RDW: 13.3 % (ref 11.5–15.5)
WBC: 3.5 10*3/uL — ABNORMAL LOW (ref 4.0–10.5)
nRBC: 0 % (ref 0.0–0.2)

## 2019-11-07 MED ORDER — SODIUM CHLORIDE 0.9 % IV BOLUS
500.0000 mL | Freq: Once | INTRAVENOUS | Status: AC
  Administered 2019-11-07: 500 mL via INTRAVENOUS

## 2019-11-07 NOTE — ED Triage Notes (Signed)
C/o sore throat, generalized body aches, headache, and chills x 2 days.
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
## 2019-11-07 NOTE — Discharge Instructions (Signed)
You did not want a Covid test here today.  If you have any new worsening symptoms please seek reevaluation.  You are to rest and drink plenty fluids at home.  You may take Tylenol and ibuprofen as needed for pain.

## 2019-11-11 MED FILL — OXYCODONE-APAP 10-325: 10-325 | 30 days supply | Qty: 60 | Fill #0

## 2019-11-16 ENCOUNTER — Inpatient Hospital Stay: Payer: Medicare Other | Attending: Oncology

## 2019-11-16 ENCOUNTER — Inpatient Hospital Stay: Payer: Medicare Other

## 2019-11-16 ENCOUNTER — Other Ambulatory Visit: Payer: Self-pay

## 2019-11-16 DIAGNOSIS — Z79899 Other long term (current) drug therapy: Secondary | ICD-10-CM | POA: Insufficient documentation

## 2019-11-16 DIAGNOSIS — Z886 Allergy status to analgesic agent status: Secondary | ICD-10-CM | POA: Diagnosis not present

## 2019-11-16 DIAGNOSIS — D649 Anemia, unspecified: Secondary | ICD-10-CM | POA: Diagnosis not present

## 2019-11-16 DIAGNOSIS — C9001 Multiple myeloma in remission: Secondary | ICD-10-CM

## 2019-11-16 DIAGNOSIS — C61 Malignant neoplasm of prostate: Secondary | ICD-10-CM | POA: Diagnosis present

## 2019-11-16 DIAGNOSIS — Z95828 Presence of other vascular implants and grafts: Secondary | ICD-10-CM

## 2019-11-16 DIAGNOSIS — C9000 Multiple myeloma not having achieved remission: Secondary | ICD-10-CM | POA: Diagnosis not present

## 2019-11-16 DIAGNOSIS — Z7901 Long term (current) use of anticoagulants: Secondary | ICD-10-CM | POA: Diagnosis not present

## 2019-11-16 LAB — CBC WITH DIFFERENTIAL (CANCER CENTER ONLY)
Abs Immature Granulocytes: 0.02 10*3/uL (ref 0.00–0.07)
Basophils Absolute: 0 10*3/uL (ref 0.0–0.1)
Basophils Relative: 0 %
Eosinophils Absolute: 0 10*3/uL (ref 0.0–0.5)
Eosinophils Relative: 0 %
HCT: 33.7 % — ABNORMAL LOW (ref 39.0–52.0)
Hemoglobin: 11.2 g/dL — ABNORMAL LOW (ref 13.0–17.0)
Immature Granulocytes: 0 %
Lymphocytes Relative: 44 %
Lymphs Abs: 2.1 10*3/uL (ref 0.7–4.0)
MCH: 31.3 pg (ref 26.0–34.0)
MCHC: 33.2 g/dL (ref 30.0–36.0)
MCV: 94.1 fL (ref 80.0–100.0)
Monocytes Absolute: 0.3 10*3/uL (ref 0.1–1.0)
Monocytes Relative: 6 %
Neutro Abs: 2.3 10*3/uL (ref 1.7–7.7)
Neutrophils Relative %: 50 %
Platelet Count: 175 10*3/uL (ref 150–400)
RBC: 3.58 MIL/uL — ABNORMAL LOW (ref 4.22–5.81)
RDW: 13.7 % (ref 11.5–15.5)
WBC Count: 4.7 10*3/uL (ref 4.0–10.5)
nRBC: 0 % (ref 0.0–0.2)

## 2019-11-16 LAB — CMP (CANCER CENTER ONLY)
ALT: 26 U/L (ref 0–44)
AST: 20 U/L (ref 15–41)
Albumin: 4 g/dL (ref 3.5–5.0)
Alkaline Phosphatase: 77 U/L (ref 38–126)
Anion gap: 6 (ref 5–15)
BUN: 12 mg/dL (ref 8–23)
CO2: 25 mmol/L (ref 22–32)
Calcium: 9 mg/dL (ref 8.9–10.3)
Chloride: 104 mmol/L (ref 98–111)
Creatinine: 0.96 mg/dL (ref 0.61–1.24)
GFR, Est AFR Am: >60 mL/min (ref >60)
GFR, Estimated: >60 mL/min (ref >60)
Glucose, Bld: 102 mg/dL — ABNORMAL HIGH (ref 70–99)
Potassium: 4.6 mmol/L (ref 3.5–5.1)
Sodium: 135 mmol/L (ref 135–145)
Total Bilirubin: 0.3 mg/dL (ref 0.3–1.2)
Total Protein: 7.9 g/dL (ref 6.5–8.1)

## 2019-11-16 MED ORDER — SODIUM CHLORIDE 0.9% FLUSH
10.0000 mL | INTRAVENOUS | Status: DC | PRN
  Administered 2019-11-16: 10 mL
  Filled 2019-11-16: qty 10

## 2019-11-16 MED ORDER — HEPARIN SOD (PORK) LOCK FLUSH 100 UNIT/ML IV SOLN
500.0000 [IU] | Freq: Once | INTRAVENOUS | Status: AC | PRN
  Administered 2019-11-16: 500 [IU]
  Filled 2019-11-16: qty 5

## 2019-11-16 NOTE — Patient Instructions (Signed)

## 2019-11-16 NOTE — Progress Notes (Signed)
Attempted to find patient twice for flush appt.  Called cell phone, wife answered and stated she would let pt know [facility] staff was looking for him.

## 2019-11-18 LAB — KAPPA/LAMBDA LIGHT CHAINS
Kappa free light chain: 29.6 mg/L — ABNORMAL HIGH (ref 3.3–19.4)
Kappa, lambda light chain ratio: 2.79 — ABNORMAL HIGH (ref 0.26–1.65)
Lambda free light chains: 10.6 mg/L (ref 5.7–26.3)

## 2019-11-19 LAB — MULTIPLE MYELOMA PANEL, SERUM
Albumin SerPl Elph-Mcnc: 3.6 g/dL (ref 2.9–4.4)
Albumin/Glob SerPl: 1.1 (ref 0.7–1.7)
Alpha 1: 0.2 g/dL (ref 0.0–0.4)
Alpha2 Glob SerPl Elph-Mcnc: 0.8 g/dL (ref 0.4–1.0)
B-Globulin SerPl Elph-Mcnc: 0.8 g/dL (ref 0.7–1.3)
Gamma Glob SerPl Elph-Mcnc: 1.7 g/dL (ref 0.4–1.8)
Globulin, Total: 3.6 g/dL (ref 2.2–3.9)
IgA: 87 mg/dL (ref 61–437)
IgG (Immunoglobin G), Serum: 1785 mg/dL — ABNORMAL HIGH (ref 603–1613)
IgM (Immunoglobulin M), Srm: 35 mg/dL (ref 20–172)
M Protein SerPl Elph-Mcnc: 1 g/dL — ABNORMAL HIGH
Total Protein ELP: 7.2 g/dL (ref 6.0–8.5)

## 2019-11-23 ENCOUNTER — Inpatient Hospital Stay: Payer: Medicare Other | Admitting: Oncology

## 2019-11-24 ENCOUNTER — Ambulatory Visit: Payer: Medicare Other | Admitting: Oncology

## 2019-12-07 ENCOUNTER — Inpatient Hospital Stay (HOSPITAL_BASED_OUTPATIENT_CLINIC_OR_DEPARTMENT_OTHER): Payer: Medicare Other | Admitting: Oncology

## 2019-12-07 ENCOUNTER — Other Ambulatory Visit: Payer: Self-pay | Admitting: Oncology

## 2019-12-07 ENCOUNTER — Other Ambulatory Visit: Payer: Self-pay

## 2019-12-07 VITALS — BP 143/79 | HR 60 | Temp 98.7°F | Resp 20 | Ht 73.0 in | Wt 167.1 lb

## 2019-12-07 DIAGNOSIS — C61 Malignant neoplasm of prostate: Secondary | ICD-10-CM | POA: Diagnosis not present

## 2019-12-07 DIAGNOSIS — C9000 Multiple myeloma not having achieved remission: Secondary | ICD-10-CM | POA: Diagnosis not present

## 2019-12-07 DIAGNOSIS — Z95828 Presence of other vascular implants and grafts: Secondary | ICD-10-CM | POA: Diagnosis not present

## 2019-12-07 MED ORDER — LIDOCAINE-PRILOCAINE 2.5-2.5 % EX CREA: TOPICAL_CREAM | CUTANEOUS | 2 refills | Status: DC

## 2019-12-07 MED FILL — LIDOCAINE-PRILOCAINE CREAM: 2.5-2.5 | 30 days supply | Qty: 30 | Fill #0

## 2019-12-07 NOTE — Progress Notes (Signed)
Hematology and Oncology Follow Up Visit  Travis Nguyen [id number] February 09, 1951 69 y.o. 12/07/2019 9:52 AM Travis Nguyen, Travis Brow, Travis Nguyen   Principle Diagnosis: 69 year old man with:  1.  IgG kappa multiple myeloma diagnosed in 2014.  He was found to have 23% plasma cell involvement in his bone marrow.  2.  Prostate cancer diagnosed in 2014.  He was found to have stage a T1c, Gleason score 3+4 = 7.   Prior Therapy: He was treated initially with Cytoxan, Velcade and Decadron and subsequently his regimen changed to a Velcade, Revlimid with dexamethasone.  And has been in remission since 2015.  He is status post a robotic-assisted laparoscopic radical prostatectomy and bilateral lymph node dissection on 02/09/2014. The final pathology showed prostate adenocarcinoma Gleason score 4+3 equals 7 involving both lobes.    Current therapy: Active surveillance.  Interim History:  Mr. Jump returns today for a repeat evaluation.  Since the last visit, he reports no major changes in his health.  He continues to feel well without any recent hospitalization or illnesses.  Denies any abdominal pain or hematuria.  He denies any pathological fractures or recurrent infections.  He denies any worsening neuropathy.      Medications: Reviewed without changes. Current Outpatient Medications  Medication Sig Dispense Refill  . HYDROcodone-acetaminophen (NORCO/VICODIN) 5-325 MG tablet Take 1 tablet by mouth every 6 (six) hours as needed for moderate pain. (Patient not taking: Reported on 11/12/2017) 60 tablet 0  . lidocaine-prilocaine (EMLA) cream APPLY TOPICALLY TO PORT-A-CATH DAILY AS NEEDED 30 g 2  . losartan (COZAAR) 100 MG tablet Take 100 mg by mouth every morning.     [name] oxyCODONE-acetaminophen (PERCOCET) 10-325 MG tablet Take 1 tablet by mouth as needed.  0  . polyethylene glycol (MIRALAX / GLYCOLAX) packet Take 17 g by mouth daily as needed for mild constipation. (Patient not taking: Reported on  07/03/2017) 14 each 0  . rivaroxaban (XARELTO) 20 MG TABS tablet Take 1 tablet (20 mg total) by mouth daily with supper. (Patient not taking: Reported on 11/12/2017) 30 tablet 0   No current facility-administered medications for this visit.     Allergies:  Allergies  Allergen Reactions  . Aspirin     Pt stated "raises BP"        Physical Exam: Blood pressure (!) 143/79, pulse 60, temperature 98.7 F (37.1 C), temperature source Temporal, resp. rate 20, height _0  (1.854 m), weight 167 lb 1.6 oz (75.8 kg), SpO2 100 %.   ECOG: 1    General appearance: Comfortable appearing without any discomfort Head: Normocephalic without any trauma Oropharynx: Mucous membranes are moist and pink without any thrush or ulcers. Eyes: Pupils are equal and round reactive to light. Lymph nodes: No cervical, supraclavicular, inguinal or axillary lymphadenopathy.   Heart:regular rate and rhythm.  S1 and S2 without leg edema. Lung: Clear without any rhonchi or wheezes.  No dullness to percussion. Abdomin: Soft, nontender, nondistended with good bowel sounds.  No hepatosplenomegaly. Musculoskeletal: No joint deformity or effusion.  Full range of motion noted. Neurological: No deficits noted on motor, sensory and deep tendon reflex exam. Skin: No petechial rash or dryness.  Appeared moist.      Lab Results: Lab Results  Component Value Date   WBC 4.7 11/16/2019   HGB 11.2 (L) 11/16/2019   HCT 33.7 (L) 11/16/2019   MCV 94.1 11/16/2019   PLT 175 11/16/2019     Chemistry      Component Value Date/Time  NA 135 11/16/2019 1047   NA 138 03/27/2017 0838   K 4.6 11/16/2019 1047   K 4.3 03/27/2017 0838   CL 104 11/16/2019 1047   CL 105 01/01/2013 0835   CO2 25 11/16/2019 1047   CO2 24 03/27/2017 0838   BUN 12 11/16/2019 1047   BUN 23.7 03/27/2017 0838   CREATININE 0.96 11/16/2019 1047   CREATININE 1.2 03/27/2017 0838      Component Value Date/Time   CALCIUM 9.0 11/16/2019 1047    CALCIUM 9.9 03/27/2017 0838   ALKPHOS 77 11/16/2019 1047   ALKPHOS 79 03/27/2017 0838   AST 20 11/16/2019 1047   AST 25 03/27/2017 0838   ALT 26 11/16/2019 1047   ALT 26 03/27/2017 0838   BILITOT 0.3 11/16/2019 1047   BILITOT 0.31 03/27/2017 0838      Results for RIDER, ERMIS (MRN [id number]) as of 12/07/2019 09:44  Ref. Range 11/03/2018 08:30 05/12/2019 08:24 11/16/2019 10:47  M Protein SerPl Elph-Mcnc Latest Ref Range: Not Observed g/dL 0.3 (H) 0.6 (H) 1.0 (H)  IFE 1 Unknown Comment Comment (A) Comment (A)  Globulin, Total Latest Ref Range: 2.2 - 3.9 g/dL 3.3 3.2 3.6  B-Globulin SerPl Elph-Mcnc Latest Ref Range: 0.7 - 1.3 g/dL 1.0 0.9 0.8  IgG (Immunoglobin G), Serum Latest Ref Range: 603 - 1,613 mg/dL 1,341 1,465 1,785 (H)  IgM (Immunoglobulin M), Srm Latest Ref Range: 20 - 172 mg/dL 113 68 35  IgA Latest Ref Range: 61 - 437 mg/dL 220 137 87       Impression and Plan:  69 year old man with:   1.  IgG kappa multiple myeloma diagnosed in 2014.   The natural course of this disease was reviewed at this time M protein studies obtained on May 4 of 2021 were discussed.  He continues to have slow rise in his M spike that is currently around 1 g/dL with elevated IgG level.  Risks and benefits of starting treatment at this time versus continued observation were reviewed.  He is developing slightly worsening anemia although asymptomatic at this time.  After discussion today, I have recommended continued active surveillance would likely start salvage therapy in the near future if he develops symptomatic disease.  Revlimid-based therapy may be an option at this time given his limited exposure previously.   2.  Prostate cancer: He was diagnosed with T1c Gleason score 7 prostate cancer in 2015.  His PSA continues to be undetectable monitor periodically.   3.  IV access: Port-A-Cath continues to be in use and flushed periodically.  EMLA cream will be made available to him.   4.  Followup: In 6 months for repeat evaluation.  30  minutes were dedicated to this visit. The time was spent on reviewing laboratory data, discussing treatment options,  and answering questions regarding future plan.    Zola Button, Travis Nguyen 5/25/20219:52 AM

## 2019-12-08 ENCOUNTER — Telehealth: Payer: Self-pay | Admitting: Oncology

## 2019-12-08 NOTE — Telephone Encounter (Signed)
Scheduled appt per 5/25 los.  Spoke with pt wife and she is aware of the appt dates and time

## 2019-12-09 ENCOUNTER — Emergency Department (HOSPITAL_COMMUNITY): Payer: Medicare Other

## 2019-12-09 ENCOUNTER — Emergency Department (HOSPITAL_COMMUNITY)
Admission: EM | Admit: 2019-12-09 | Discharge: 2019-12-09 | Disposition: A | Discharge status: Home / Self Care | Payer: Medicare Other | Attending: Emergency Medicine | Admitting: Emergency Medicine

## 2019-12-09 ENCOUNTER — Encounter (HOSPITAL_COMMUNITY): Payer: Self-pay | Admitting: Emergency Medicine

## 2019-12-09 DIAGNOSIS — Y999 Unspecified external cause status: Secondary | ICD-10-CM | POA: Insufficient documentation

## 2019-12-09 DIAGNOSIS — Y9301 Activity, walking, marching and hiking: Secondary | ICD-10-CM | POA: Diagnosis not present

## 2019-12-09 DIAGNOSIS — S99921A Unspecified injury of right foot, initial encounter: Secondary | ICD-10-CM | POA: Diagnosis present

## 2019-12-09 DIAGNOSIS — Y929 Unspecified place or not applicable: Secondary | ICD-10-CM | POA: Diagnosis not present

## 2019-12-09 DIAGNOSIS — M79671 Pain in right foot: Secondary | ICD-10-CM

## 2019-12-09 DIAGNOSIS — Z87891 Personal history of nicotine dependence: Secondary | ICD-10-CM | POA: Diagnosis not present

## 2019-12-09 DIAGNOSIS — X58XXXA Exposure to other specified factors, initial encounter: Secondary | ICD-10-CM | POA: Diagnosis not present

## 2019-12-09 DIAGNOSIS — I10 Essential (primary) hypertension: Secondary | ICD-10-CM | POA: Diagnosis not present

## 2019-12-09 DIAGNOSIS — Z79899 Other long term (current) drug therapy: Secondary | ICD-10-CM | POA: Insufficient documentation

## 2019-12-09 MED FILL — OXYCODONE-APAP 10-325: 10-325 | 30 days supply | Qty: 60 | Fill #0

## 2019-12-09 NOTE — Progress Notes (Signed)
Orthopedic Tech Progress Note Patient Details:  Travis Nguyen 10/04/1950 [id number]  Ortho Devices Type of Ortho Device: ASO Ortho Device/Splint Location: RLE Ortho Device/Splint Interventions: Ordered, Application   Post Interventions Patient Tolerated: Well Instructions Provided: Adjustment of device, Care of device   Tonatiuh Mallon 12/09/2019, 5:26 PM

## 2019-12-09 NOTE — Discharge Instructions (Addendum)
Please read instructions below. Apply ice to your foot for 20 minutes at a time. Elevate it as much as possible. Weight-bear as tolerated. You can take OTC medications as directed for pain. Schedule an appointment with your primary care provider to follow-up on your injury. Return to the ER for new or concerning symptoms.

## 2019-12-09 NOTE — ED Triage Notes (Signed)
Pt reports R foot pain in instep area that began today while he was walking, also endorses some swelling. Limping into triage.
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
## 2020-01-07 MED FILL — OXYCODONE-APAP 10-325: 10-325 | 30 days supply | Qty: 60 | Fill #0

## 2020-02-05 MED FILL — OXYCODONE-APAP 10-325: 10-325 | 30 days supply | Qty: 60 | Fill #0

## 2020-02-07 ENCOUNTER — Other Ambulatory Visit: Payer: Self-pay

## 2020-02-07 ENCOUNTER — Inpatient Hospital Stay: Payer: Medicare Other | Attending: Oncology

## 2020-02-07 DIAGNOSIS — C9001 Multiple myeloma in remission: Secondary | ICD-10-CM

## 2020-02-07 DIAGNOSIS — C61 Malignant neoplasm of prostate: Secondary | ICD-10-CM

## 2020-02-07 DIAGNOSIS — Z95828 Presence of other vascular implants and grafts: Secondary | ICD-10-CM

## 2020-02-07 MED ORDER — SODIUM CHLORIDE 0.9% FLUSH
10.0000 mL | INTRAVENOUS | Status: DC | PRN
  Filled 2020-02-07: qty 10

## 2020-02-07 MED ORDER — HEPARIN SOD (PORK) LOCK FLUSH 100 UNIT/ML IV SOLN
500.0000 [IU] | Freq: Once | INTRAVENOUS | Status: DC | PRN
  Filled 2020-02-07: qty 5

## 2020-02-28 MED FILL — LOSARTAN POTASSIUM 100 MG T: 100 | 90 days supply | Qty: 90 | Fill #1

## 2020-03-04 MED FILL — OXYCODONE-APAP 10-325: 10-325 | 30 days supply | Qty: 60 | Fill #0

## 2020-04-03 MED FILL — OXYCODONE-APAP 10-325: 10-325 | 30 days supply | Qty: 60 | Fill #0

## 2020-04-10 ENCOUNTER — Inpatient Hospital Stay: Payer: Medicare Other | Attending: Oncology

## 2020-04-10 ENCOUNTER — Other Ambulatory Visit: Payer: Self-pay

## 2020-04-10 DIAGNOSIS — Z95828 Presence of other vascular implants and grafts: Secondary | ICD-10-CM

## 2020-04-10 DIAGNOSIS — C9001 Multiple myeloma in remission: Secondary | ICD-10-CM

## 2020-04-10 DIAGNOSIS — C61 Malignant neoplasm of prostate: Secondary | ICD-10-CM | POA: Diagnosis present

## 2020-04-10 DIAGNOSIS — Z452 Encounter for adjustment and management of vascular access device: Secondary | ICD-10-CM | POA: Diagnosis not present

## 2020-04-10 MED ORDER — HEPARIN SOD (PORK) LOCK FLUSH 100 UNIT/ML IV SOLN
500.0000 [IU] | Freq: Once | INTRAVENOUS | Status: AC | PRN
  Administered 2020-04-10: 500 [IU]
  Filled 2020-04-10: qty 5

## 2020-04-10 MED ORDER — SODIUM CHLORIDE 0.9% FLUSH
10.0000 mL | INTRAVENOUS | Status: DC | PRN
  Administered 2020-04-10: 10 mL
  Filled 2020-04-10: qty 10

## 2020-04-10 NOTE — Patient Instructions (Signed)

## 2020-05-01 ENCOUNTER — Other Ambulatory Visit (HOSPITAL_COMMUNITY): Payer: Self-pay | Admitting: Family Medicine

## 2020-05-02 MED FILL — OXYCODONE-APAP 10-325: 10-325 | 30 days supply | Qty: 60 | Fill #0

## 2020-05-30 ENCOUNTER — Other Ambulatory Visit (HOSPITAL_COMMUNITY): Payer: Self-pay | Admitting: Family Medicine

## 2020-05-30 MED FILL — OXYCODONE-APAP 10-325: 10-325 | 30 days supply | Qty: 60 | Fill #0

## 2020-06-02 ENCOUNTER — Other Ambulatory Visit: Payer: Self-pay

## 2020-06-02 ENCOUNTER — Emergency Department (HOSPITAL_COMMUNITY)
Admission: EM | Admit: 2020-06-02 | Discharge: 2020-06-02 | Disposition: A | Discharge status: Home / Self Care | Payer: Medicare Other | Attending: Emergency Medicine | Admitting: Emergency Medicine

## 2020-06-02 ENCOUNTER — Other Ambulatory Visit (HOSPITAL_COMMUNITY): Payer: Self-pay | Admitting: Student

## 2020-06-02 ENCOUNTER — Emergency Department (HOSPITAL_COMMUNITY): Payer: Medicare Other

## 2020-06-02 ENCOUNTER — Encounter (HOSPITAL_COMMUNITY): Payer: Self-pay | Admitting: Emergency Medicine

## 2020-06-02 DIAGNOSIS — I10 Essential (primary) hypertension: Secondary | ICD-10-CM | POA: Insufficient documentation

## 2020-06-02 DIAGNOSIS — M545 Low back pain, unspecified: Secondary | ICD-10-CM | POA: Insufficient documentation

## 2020-06-02 DIAGNOSIS — Z79899 Other long term (current) drug therapy: Secondary | ICD-10-CM | POA: Diagnosis not present

## 2020-06-02 DIAGNOSIS — Z8546 Personal history of malignant neoplasm of prostate: Secondary | ICD-10-CM | POA: Diagnosis not present

## 2020-06-02 DIAGNOSIS — Z87891 Personal history of nicotine dependence: Secondary | ICD-10-CM | POA: Insufficient documentation

## 2020-06-02 DIAGNOSIS — G8929 Other chronic pain: Secondary | ICD-10-CM | POA: Diagnosis not present

## 2020-06-02 MED ORDER — METHOCARBAMOL 500 MG PO TABS: 500.0000 mg | ORAL_TABLET | Freq: Two times a day (BID) | ORAL | 0 refills | Status: DC

## 2020-06-02 MED ORDER — OXYCODONE HCL 5 MG PO TABS
5.0000 mg | ORAL_TABLET | Freq: Once | ORAL | Status: AC
  Administered 2020-06-02: 5 mg via ORAL
  Filled 2020-06-02: qty 1

## 2020-06-02 MED ORDER — ACETAMINOPHEN 500 MG PO TABS
1000.0000 mg | ORAL_TABLET | Freq: Once | ORAL | Status: AC
  Administered 2020-06-02: 1000 mg via ORAL
  Filled 2020-06-02: qty 2

## 2020-06-02 MED ORDER — METHOCARBAMOL 500 MG PO TABS
500.0000 mg | ORAL_TABLET | Freq: Once | ORAL | Status: AC
  Administered 2020-06-02: 500 mg via ORAL
  Filled 2020-06-02: qty 1

## 2020-06-02 MED ORDER — LIDOCAINE 5 % EX PTCH
1.0000 | MEDICATED_PATCH | Freq: Once | CUTANEOUS | Status: DC
  Administered 2020-06-02: 1 via TRANSDERMAL
  Filled 2020-06-02: qty 1

## 2020-06-02 MED FILL — METHOCARBAMOL 500 MG TABS: 500 | 10 days supply | Qty: 20 | Fill #0

## 2020-06-02 NOTE — ED Triage Notes (Signed)
Pt reports mid, lower back pain x1 week. States he has some chronic back pain due to DDD but recently pain is worse. Pain with walking and bending over to put shoes on. Denies numbness, tingling, weakness or urinary symptoms. Ambulatory to triage. Takes oxycodone regularly, states no relief of pain with meds.

## 2020-06-02 NOTE — ED Notes (Signed)
Received from waiting room at this time, ambulatory with steady gait.

## 2020-06-02 NOTE — ED Notes (Signed)
Patient transported to X-ray 
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
## 2020-06-02 NOTE — ED Notes (Signed)
Pt discharge instructions and prescriptions reviewed with the patient. The patient verbalized understanding of both. Pt discharged. 

## 2020-06-02 NOTE — Discharge Instructions (Signed)
HOME INSTRUCTIONS Self - care:  The application of heat can help soothe the pain.  Maintaining your daily activities, including walking (this is encouraged), as it will help you get better faster than just staying in bed. Do not life, push, pull anything more than 10 pounds for the next week. I am attaching back exercises that you can do at home to help facilitate your recovery.   Back Exercises - I have attached a handout on back exercises that can be done at home to help facilitate your recovery.   Medications are also useful to help with pain control in addition to your home pain medication  Acetaminophen.  This medication is generally safe, and found over the counter. Take as directed for your age. You should not take more than 8 of the extra strength (500mg ) pills a day (max dose is 4000mg  total OVER one day)  Lidocaine Patch: Salon Pas lidocaine patches (blue and silver box) can be purchased over the counter and worn for 12 hours for local pain relief   Muscle relaxants:  These medications can help with muscle tightness that is a cause of lower back pain.  Most of these medications can cause drowsiness, and it is not safe to drive or use dangerous machinery while taking them. They are primarily helpful when taken at night before sleep.  You will need to follow up with your primary healthcare provider or the Orthopedist in 1-2 weeks for reassessment and persistent symptoms.  Be aware that if you develop new symptoms, such as a fever, leg weakness, difficulty with or loss of control of your urine or bowels, abdominal pain, or more severe pain, you will need to seek medical attention and/or return to the Emergency department. Additional Information:  Your vital signs today were: BP (!) 158/77   Pulse 60   Temp (!) 97.5 F (36.4 C)   Resp 18   Ht 6\' 1"  (1.854 m)   Wt 75.8 kg   SpO2 99%   BMI 22.05 kg/m  If your blood pressure (BP) was elevated above 135/85 this visit, please have this  repeated by your doctor within one month. ---------------

## 2020-06-02 NOTE — ED Notes (Signed)
ED Provider at bedside. 

## 2020-06-06 ENCOUNTER — Other Ambulatory Visit (HOSPITAL_COMMUNITY): Payer: Self-pay | Admitting: Family Medicine

## 2020-06-06 MED FILL — LOSARTAN POTASSIUM 100 MG T: 100 | 90 days supply | Qty: 90 | Fill #0

## 2020-06-13 ENCOUNTER — Inpatient Hospital Stay: Payer: Medicare Other | Attending: Oncology

## 2020-06-13 ENCOUNTER — Inpatient Hospital Stay: Payer: Medicare Other

## 2020-06-13 ENCOUNTER — Other Ambulatory Visit: Payer: Self-pay

## 2020-06-13 DIAGNOSIS — C9001 Multiple myeloma in remission: Secondary | ICD-10-CM | POA: Diagnosis present

## 2020-06-13 DIAGNOSIS — C61 Malignant neoplasm of prostate: Secondary | ICD-10-CM

## 2020-06-13 DIAGNOSIS — Z95828 Presence of other vascular implants and grafts: Secondary | ICD-10-CM

## 2020-06-13 LAB — CMP (CANCER CENTER ONLY)
ALT: 24 U/L (ref 0–44)
AST: 18 U/L (ref 15–41)
Albumin: 3.7 g/dL (ref 3.5–5.0)
Alkaline Phosphatase: 71 U/L (ref 38–126)
Anion gap: 6 (ref 5–15)
BUN: 17 mg/dL (ref 8–23)
CO2: 23 mmol/L (ref 22–32)
Calcium: 9.1 mg/dL (ref 8.9–10.3)
Chloride: 106 mmol/L (ref 98–111)
Creatinine: 1.02 mg/dL (ref 0.61–1.24)
GFR, Estimated: >60 mL/min (ref >60)
Glucose, Bld: 98 mg/dL (ref 70–99)
Potassium: 4.3 mmol/L (ref 3.5–5.1)
Sodium: 135 mmol/L (ref 135–145)
Total Bilirubin: 0.4 mg/dL (ref 0.3–1.2)
Total Protein: 8.2 g/dL — ABNORMAL HIGH (ref 6.5–8.1)

## 2020-06-13 LAB — CBC WITH DIFFERENTIAL (CANCER CENTER ONLY)
Abs Immature Granulocytes: 0.01 10*3/uL (ref 0.00–0.07)
Basophils Absolute: 0 10*3/uL (ref 0.0–0.1)
Basophils Relative: 0 %
Eosinophils Absolute: 0 10*3/uL (ref 0.0–0.5)
Eosinophils Relative: 0 %
HCT: 31.9 % — ABNORMAL LOW (ref 39.0–52.0)
Hemoglobin: 10.9 g/dL — ABNORMAL LOW (ref 13.0–17.0)
Immature Granulocytes: 0 %
Lymphocytes Relative: 36 %
Lymphs Abs: 1.2 10*3/uL (ref 0.7–4.0)
MCH: 32.5 pg (ref 26.0–34.0)
MCHC: 34.2 g/dL (ref 30.0–36.0)
MCV: 95.2 fL (ref 80.0–100.0)
Monocytes Absolute: 0.2 10*3/uL (ref 0.1–1.0)
Monocytes Relative: 6 %
Neutro Abs: 1.9 10*3/uL (ref 1.7–7.7)
Neutrophils Relative %: 58 %
Platelet Count: 190 10*3/uL (ref 150–400)
RBC: 3.35 MIL/uL — ABNORMAL LOW (ref 4.22–5.81)
RDW: 13.7 % (ref 11.5–15.5)
WBC Count: 3.3 10*3/uL — ABNORMAL LOW (ref 4.0–10.5)
nRBC: 0 % (ref 0.0–0.2)

## 2020-06-13 MED ORDER — HEPARIN SOD (PORK) LOCK FLUSH 100 UNIT/ML IV SOLN
500.0000 [IU] | Freq: Once | INTRAVENOUS | Status: AC | PRN
  Administered 2020-06-13: 500 [IU]
  Filled 2020-06-13: qty 5

## 2020-06-13 MED ORDER — SODIUM CHLORIDE 0.9% FLUSH
10.0000 mL | INTRAVENOUS | Status: DC | PRN
  Administered 2020-06-13: 10 mL
  Filled 2020-06-13: qty 10

## 2020-06-14 LAB — KAPPA/LAMBDA LIGHT CHAINS
Kappa free light chain: 39.4 mg/L — ABNORMAL HIGH (ref 3.3–19.4)
Kappa, lambda light chain ratio: 9.16 — ABNORMAL HIGH (ref 0.26–1.65)
Lambda free light chains: 4.3 mg/L — ABNORMAL LOW (ref 5.7–26.3)

## 2020-06-14 LAB — PROSTATE-SPECIFIC AG, SERUM (LABCORP): Prostate Specific Ag, Serum: <0.1 ng/mL (ref 0.0–4.0)

## 2020-06-15 LAB — MULTIPLE MYELOMA PANEL, SERUM
Albumin SerPl Elph-Mcnc: 3.7 g/dL (ref 2.9–4.4)
Albumin/Glob SerPl: 1 (ref 0.7–1.7)
Alpha 1: 0.2 g/dL (ref 0.0–0.4)
Alpha2 Glob SerPl Elph-Mcnc: 0.8 g/dL (ref 0.4–1.0)
B-Globulin SerPl Elph-Mcnc: 0.9 g/dL (ref 0.7–1.3)
Gamma Glob SerPl Elph-Mcnc: 2.1 g/dL — ABNORMAL HIGH (ref 0.4–1.8)
Globulin, Total: 4 g/dL — ABNORMAL HIGH (ref 2.2–3.9)
IgA: 52 mg/dL — ABNORMAL LOW (ref 61–437)
IgG (Immunoglobin G), Serum: 2418 mg/dL — ABNORMAL HIGH (ref 603–1613)
IgM (Immunoglobulin M), Srm: 19 mg/dL — ABNORMAL LOW (ref 20–172)
M Protein SerPl Elph-Mcnc: 1.8 g/dL — ABNORMAL HIGH
Total Protein ELP: 7.7 g/dL (ref 6.0–8.5)

## 2020-06-20 ENCOUNTER — Other Ambulatory Visit: Payer: Self-pay

## 2020-06-20 ENCOUNTER — Inpatient Hospital Stay: Payer: Medicare Other | Attending: Oncology | Admitting: Oncology

## 2020-06-20 VITALS — BP 165/80 | HR 65 | Temp 97.8°F | Resp 18 | Ht 73.0 in | Wt 162.3 lb

## 2020-06-20 DIAGNOSIS — C9000 Multiple myeloma not having achieved remission: Secondary | ICD-10-CM | POA: Diagnosis not present

## 2020-06-20 DIAGNOSIS — R5383 Other fatigue: Secondary | ICD-10-CM | POA: Diagnosis not present

## 2020-06-20 DIAGNOSIS — C9002 Multiple myeloma in relapse: Secondary | ICD-10-CM | POA: Diagnosis present

## 2020-06-20 DIAGNOSIS — Z79899 Other long term (current) drug therapy: Secondary | ICD-10-CM | POA: Insufficient documentation

## 2020-06-20 DIAGNOSIS — C61 Malignant neoplasm of prostate: Secondary | ICD-10-CM | POA: Diagnosis present

## 2020-06-20 DIAGNOSIS — Z7901 Long term (current) use of anticoagulants: Secondary | ICD-10-CM | POA: Insufficient documentation

## 2020-06-20 DIAGNOSIS — M549 Dorsalgia, unspecified: Secondary | ICD-10-CM | POA: Diagnosis not present

## 2020-06-20 DIAGNOSIS — C9001 Multiple myeloma in remission: Secondary | ICD-10-CM

## 2020-06-20 DIAGNOSIS — R768 Other specified abnormal immunological findings in serum: Secondary | ICD-10-CM | POA: Insufficient documentation

## 2020-06-20 DIAGNOSIS — D649 Anemia, unspecified: Secondary | ICD-10-CM | POA: Insufficient documentation

## 2020-06-20 DIAGNOSIS — Z886 Allergy status to analgesic agent status: Secondary | ICD-10-CM | POA: Diagnosis not present

## 2020-06-20 NOTE — Progress Notes (Signed)
Hematology and Oncology Follow Up Visit  ATREUS HASZ [id number] February 26, 1951 69 y.o. 06/20/2020 8:30 AM [name], [name], MD   Principle Diagnosis: 69 year old man with:  1.  Multiple myeloma diagnosed in 2014.  He was found to have IgG kappa with 23% plasma cell involvement in his bone marrow.  2.  Stage a T1c, Gleason score 3+4 = 7 prostate cancer that is currently in remission.  Prior Therapy: He was treated initially with Cytoxan, Velcade and Decadron and subsequently his regimen changed to a Velcade, Revlimid with dexamethasone.  He achieved remission at that time.   He is status post a robotic-assisted laparoscopic radical prostatectomy and bilateral lymph node dissection on 02/09/2014. The final pathology showed prostate adenocarcinoma Gleason score 4+3 equals 7 involving both lobes.    Current therapy: Active surveillance.  Interim History:  Mr. Keltner is here for return follow-up.  Since the last visit, he reports no major changes in his health.  He did develop acute episode of back pain that required visit to the emergency department.  On June 02, 2020 he was evaluated and underwent imaging studies of his lumbar spine which did not show any pathological fractures or any acute abnormalities.  He is taking Percocet which have helped his pain at this time.  He is able to ambulate without any difficulties.  His performance status and quality of life remains unchanged.      Medications: Updated on review. Current Outpatient Medications  Medication Sig Dispense Refill  . HYDROcodone-acetaminophen (NORCO/VICODIN) 5-325 MG tablet Take 1 tablet by mouth every 6 (six) hours as needed for moderate pain. (Patient not taking: Reported on 11/12/2017) 60 tablet 0  . lidocaine-prilocaine (EMLA) cream APPLY TOPICALLY TO PORT-A-CATH DAILY AS NEEDED 30 g 2  . losartan (COZAAR) 100 MG tablet Take 100 mg by mouth every morning.     . methocarbamol (ROBAXIN) 500 MG tablet  Take 1 tablet (500 mg total) by mouth 2 (two) times daily. 20 tablet 0  . oxyCODONE-acetaminophen (PERCOCET) 10-325 MG tablet Take 1 tablet by mouth as needed.  0  . polyethylene glycol (MIRALAX / GLYCOLAX) packet Take 17 g by mouth daily as needed for mild constipation. (Patient not taking: Reported on 07/03/2017) 14 each 0  . rivaroxaban (XARELTO) 20 MG TABS tablet Take 1 tablet (20 mg total) by mouth daily with supper. (Patient not taking: Reported on 11/12/2017) 30 tablet 0   No current facility-administered medications for this visit.     Allergies:  Allergies  Allergen Reactions  . Aspirin     Pt stated "raises BP"        Physical Exam: Blood pressure (!) 165/80, pulse 65, temperature 97.8 F (36.6 C), resp. rate 18, height $RemoveBe'6\' 1"'[id number]$  (1.854 m), weight 162 lb 4.8 oz (73.6 kg), SpO2 100 %.   ECOG: 1   General appearance: Alert, awake without any distress. Head: Atraumatic without abnormalities Oropharynx: Without any thrush or ulcers. Eyes: No scleral icterus. Lymph nodes: No lymphadenopathy noted in the cervical, supraclavicular, or axillary nodes Heart:regular rate and rhythm, without any murmurs or gallops.   Lung: Clear to auscultation without any rhonchi, wheezes or dullness to percussion. Abdomin: Soft, nontender without any shifting dullness or ascites. Musculoskeletal: No clubbing or cyanosis. Neurological: No motor or sensory deficits. Skin: No rashes or lesions.     Lab Results: Lab Results  Component Value Date   WBC 3.3 (L) 06/13/2020   HGB 10.9 (L) 06/13/2020   HCT 31.9 (L) 06/13/2020  MCV 95.2 06/13/2020   PLT 190 06/13/2020     Chemistry      Component Value Date/Time   NA 135 06/13/2020 0804   NA 138 03/27/2017 0838   K 4.3 06/13/2020 0804   K 4.3 03/27/2017 0838   CL 106 06/13/2020 0804   CL 105 01/01/2013 0835   CO2 23 06/13/2020 0804   CO2 24 03/27/2017 0838   BUN 17 06/13/2020 0804   BUN 23.7 03/27/2017 0838   CREATININE 1.02  06/13/2020 0804   CREATININE 1.2 03/27/2017 0838      Component Value Date/Time   CALCIUM 9.1 06/13/2020 0804   CALCIUM 9.9 03/27/2017 0838   ALKPHOS 71 06/13/2020 0804   ALKPHOS 79 03/27/2017 0838   AST 18 06/13/2020 0804   AST 25 03/27/2017 0838   ALT 24 06/13/2020 0804   ALT 26 03/27/2017 0838   BILITOT 0.4 06/13/2020 0804   BILITOT 0.31 03/27/2017 0838      Results for JONANTHAN, BOLENDER (MRN [id number]) as of 06/20/2020 08:32  Ref. Range 05/12/2019 08:24 11/16/2019 10:47 06/13/2020 08:04  M Protein SerPl Elph-Mcnc Latest Ref Range: Not Observed g/dL 0.6 (H) 1.0 (H) 1.8 (H)  IFE 1 Unknown Comment (A) Comment (A) Comment (A)  Globulin, Total Latest Ref Range: 2.2 - 3.9 g/dL 3.2 3.6 4.0 (H)  B-Globulin SerPl Elph-Mcnc Latest Ref Range: 0.7 - 1.3 g/dL 0.9 0.8 0.9  IgG (Immunoglobin G), Serum Latest Ref Range: 603 - 1,613 mg/dL 1,465 1,785 (H) 2,418 (H)  IgM (Immunoglobulin M), Srm Latest Ref Range: 20 - 172 mg/dL 68 35 19 (L)  IgA Latest Ref Range: 61 - 437 mg/dL 137 87 52 (L)   Results for KHYLEN, RIOLO (MRN [id number]) as of 06/20/2020 08:32  Ref. Range 05/12/2019 08:24 06/13/2020 08:04  Prostate Specific Ag, Serum Latest Ref Range: 0.0 - 4.0 ng/mL <0.1 <0.1       Impression and Plan:  69 year old man with:   1.  Multiple myeloma presented with plasma cell infiltration at 23% in 2014.  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  He is currently on active surveillance without any indication to restart treatment.  Protein studies obtained on 06/13/2020 showed an increase in his M spike up to 1.8 g/dL and increase in his IgG level.  He has also increased kappa to lambda ratio.  Kidney function and electrolytes all within normal range and that he is developing mild anemia.   Indication to restart treatment given his evidence of progression were discussed.  I recommended that updating his staging including a bone scan and a bone marrow biopsy in the next few weeks prior to  consideration to start therapy at that time.  Treatment options that include Velcade-based regimen versus Revlimid-based regimen versus different options were discussed at this time.  After discussion today, we will arrange for staging in the near future and follow-up after that.  He is agreeable with this plan.  2.  Prostate cancer: His PSA continues to be undetectable without any evidence of recurrent disease.   3.  IV access: Port-A-Cath remains in place and currently in use without any issues.   4. Followup: No return in the next few weeks for repeat follow-up after updating his staging including bone marrow biopsy and imaging studies.  30  minutes were spent on this encounter.  The time was dedicated to reviewing his disease status, discussing treatment options and future plan of care review.    Zola Button, MD 12/7/20218:30  AM

## 2020-06-22 ENCOUNTER — Other Ambulatory Visit (HOSPITAL_COMMUNITY): Payer: Self-pay | Admitting: Family Medicine

## 2020-06-22 MED FILL — ESCITALOPRAM 10 MG TABLET: 10 | 30 days supply | Qty: 30 | Fill #0

## 2020-06-28 ENCOUNTER — Other Ambulatory Visit: Payer: Self-pay | Admitting: Student

## 2020-06-28 MED FILL — OXYCODONE-APAP 10-325: 10-325 | 30 days supply | Qty: 60 | Fill #0

## 2020-06-29 ENCOUNTER — Other Ambulatory Visit: Payer: Self-pay

## 2020-06-29 ENCOUNTER — Encounter (HOSPITAL_COMMUNITY): Payer: Self-pay

## 2020-06-29 ENCOUNTER — Ambulatory Visit (HOSPITAL_COMMUNITY)
Admission: RE | Admit: 2020-06-29 | Discharge: 2020-06-29 | Disposition: A | Discharge status: Home / Self Care | Payer: Medicare Other | Source: Ambulatory Visit | Attending: Oncology | Admitting: Oncology

## 2020-06-29 DIAGNOSIS — Z7901 Long term (current) use of anticoagulants: Secondary | ICD-10-CM | POA: Diagnosis not present

## 2020-06-29 DIAGNOSIS — Z859 Personal history of malignant neoplasm, unspecified: Secondary | ICD-10-CM | POA: Diagnosis not present

## 2020-06-29 DIAGNOSIS — D72819 Decreased white blood cell count, unspecified: Secondary | ICD-10-CM | POA: Insufficient documentation

## 2020-06-29 DIAGNOSIS — I10 Essential (primary) hypertension: Secondary | ICD-10-CM | POA: Insufficient documentation

## 2020-06-29 DIAGNOSIS — M1990 Unspecified osteoarthritis, unspecified site: Secondary | ICD-10-CM | POA: Insufficient documentation

## 2020-06-29 DIAGNOSIS — C9001 Multiple myeloma in remission: Secondary | ICD-10-CM | POA: Insufficient documentation

## 2020-06-29 DIAGNOSIS — G8929 Other chronic pain: Secondary | ICD-10-CM | POA: Insufficient documentation

## 2020-06-29 DIAGNOSIS — Z79899 Other long term (current) drug therapy: Secondary | ICD-10-CM | POA: Insufficient documentation

## 2020-06-29 DIAGNOSIS — M549 Dorsalgia, unspecified: Secondary | ICD-10-CM | POA: Diagnosis not present

## 2020-06-29 LAB — CBC WITH DIFFERENTIAL/PLATELET
Abs Immature Granulocytes: 0.01 10*3/uL (ref 0.00–0.07)
Basophils Absolute: 0 10*3/uL (ref 0.0–0.1)
Basophils Relative: 0 %
Eosinophils Absolute: 0 10*3/uL (ref 0.0–0.5)
Eosinophils Relative: 0 %
HCT: 36.5 % — ABNORMAL LOW (ref 39.0–52.0)
Hemoglobin: 12.2 g/dL — ABNORMAL LOW (ref 13.0–17.0)
Immature Granulocytes: 0 %
Lymphocytes Relative: 52 %
Lymphs Abs: 1.7 10*3/uL (ref 0.7–4.0)
MCH: 32.6 pg (ref 26.0–34.0)
MCHC: 33.4 g/dL (ref 30.0–36.0)
MCV: 97.6 fL (ref 80.0–100.0)
Monocytes Absolute: 0.2 10*3/uL (ref 0.1–1.0)
Monocytes Relative: 6 %
Neutro Abs: 1.4 10*3/uL — ABNORMAL LOW (ref 1.7–7.7)
Neutrophils Relative %: 42 %
Platelets: 162 10*3/uL (ref 150–400)
RBC: 3.74 MIL/uL — ABNORMAL LOW (ref 4.22–5.81)
RDW: 13.7 % (ref 11.5–15.5)
WBC: 3.3 10*3/uL — ABNORMAL LOW (ref 4.0–10.5)
nRBC: 0 % (ref 0.0–0.2)

## 2020-06-29 LAB — PROTIME-INR
INR: 0.9 (ref 0.8–1.2)
Prothrombin Time: 11.8 seconds (ref 11.4–15.2)

## 2020-06-29 MED ORDER — MIDAZOLAM HCL 2 MG/2ML IJ SOLN
INTRAMUSCULAR | Status: AC
  Filled 2020-06-29: qty 4

## 2020-06-29 MED ORDER — MIDAZOLAM HCL 2 MG/2ML IJ SOLN
INTRAMUSCULAR | Status: AC | PRN
  Administered 2020-06-29 (×2): 1 mg via INTRAVENOUS

## 2020-06-29 MED ORDER — SODIUM CHLORIDE 0.9 % IV SOLN: INTRAVENOUS | Status: DC

## 2020-06-29 MED ORDER — FENTANYL CITRATE (PF) 100 MCG/2ML IJ SOLN
INTRAMUSCULAR | Status: AC | PRN
  Administered 2020-06-29 (×2): 50 ug via INTRAVENOUS

## 2020-06-29 MED ORDER — NALOXONE HCL 0.4 MG/ML IJ SOLN
INTRAMUSCULAR | Status: AC
  Filled 2020-06-29: qty 1

## 2020-06-29 MED ORDER — LIDOCAINE HCL (PF) 1 % IJ SOLN
INTRAMUSCULAR | Status: AC | PRN
  Administered 2020-06-29: 10 mL via INTRADERMAL

## 2020-06-29 MED ORDER — FENTANYL CITRATE (PF) 100 MCG/2ML IJ SOLN
INTRAMUSCULAR | Status: AC
  Filled 2020-06-29: qty 2

## 2020-06-29 MED ORDER — FLUMAZENIL 0.5 MG/5ML IV SOLN
INTRAVENOUS | Status: AC
  Filled 2020-06-29: qty 5

## 2020-06-29 NOTE — Procedures (Signed)
Interventional Radiology Procedure:   Indications: Multiple myeloma  Procedure: CT guided bone marrow biopsy  Findings: 2 aspirates and 1 core from right ilium  Complications: None     EBL: Minimal, less than 10 ml  Plan: Discharge to home in one hour.   Braeden Kennan R. Raye Slyter, MD  Pager: [phone]   

## 2020-06-29 NOTE — Discharge Instructions (Signed)
DO NOT SHOWER X 24 HRS  FOR ANY QUESTIONS OR CONCERNS CALL [phone]  Bone Marrow Aspiration and Bone Marrow Biopsy, Adult, Care After This sheet gives you information about how to care for yourself after your procedure. Your health care provider may also give you more specific instructions. If you have problems or questions, contact your health care provider. What can I expect after the procedure? After the procedure, it is common to have:  Mild pain and tenderness.  Swelling.  Bruising. Follow these instructions at home: Puncture site care   Follow instructions from your health care provider about how to take care of the puncture site. Make sure you: ? Wash your hands with soap and water before and after you change your bandage (dressing). If soap and water are not available, use hand sanitizer. ? Change your dressing as told by your health care provider.  Check your puncture site every day for signs of infection. Check for: ? More redness, swelling, or pain. ? Fluid or blood. ? Warmth. ? Pus or a bad smell. Activity  Return to your normal activities as told by your health care provider. Ask your health care provider what activities are safe for you.  Do not lift anything that is heavier than 10 lb (4.5 kg), or the limit that you are told, until your health care provider says that it is safe.  Do not drive for 24 hours if you were given a sedative during your procedure. General instructions   Take over-the-counter and prescription medicines only as told by your health care provider.  Do not take baths, swim, or use a hot tub until your health care provider approves. Ask your health care provider if you may take showers. You may only be allowed to take sponge baths.  If directed, put ice on the affected area. To do this: ? Put ice in a plastic bag. ? Place a towel between your skin and the bag. ? Leave the ice on for 20 minutes, 2-3 times a day.  Keep all follow-up  visits as told by your health care provider. This is important. Contact a health care provider if:  Your pain is not controlled with medicine.  You have a fever.  You have more redness, swelling, or pain around the puncture site.  You have fluid or blood coming from the puncture site.  Your puncture site feels warm to the touch.  You have pus or a bad smell coming from the puncture site. Summary  After the procedure, it is common to have mild pain, tenderness, swelling, and bruising.  Follow instructions from your health care provider about how to take care of the puncture site and what activities are safe for you.  Take over-the-counter and prescription medicines only as told by your health care provider.  Contact a health care provider if you have any signs of infection, such as fluid or blood coming from the puncture site. This information is not intended to replace advice given to you by your health care provider. Make sure you discuss any questions you have with your health care provider. Document Revised: 11/17/2018 Document Reviewed: 11/17/2018 Elsevier Patient Education  [place]. Moderate Conscious Sedation, Adult, Care After These instructions provide you with information about caring for yourself after your procedure. Your health care provider may also give you more specific instructions. Your treatment has been planned according to current medical practices, but problems sometimes occur. Call your health care provider if you have any problems  or questions after your procedure. What can I expect after the procedure? After your procedure, it is common:  To feel sleepy for several hours.  To feel clumsy and have poor balance for several hours.  To have poor judgment for several hours.  To vomit if you eat too soon. Follow these instructions at home: For at least 24 hours after the procedure:   Do not: ? Participate in activities where you could fall or  become injured. ? Drive. ? Use heavy machinery. ? Drink alcohol. ? Take sleeping pills or medicines that cause drowsiness. ? Make important decisions or sign legal documents. ? Take care of children on your own.  Rest. Eating and drinking  Follow the diet recommended by your health care provider.  If you vomit: ? Drink water, juice, or soup when you can drink without vomiting. ? Make sure you have little or no nausea before eating solid foods. General instructions  Have a responsible adult stay with you until you are awake and alert.  Take over-the-counter and prescription medicines only as told by your health care provider.  If you smoke, do not smoke without supervision.  Keep all follow-up visits as told by your health care provider. This is important. Contact a health care provider if:  You keep feeling nauseous or you keep vomiting.  You feel light-headed.  You develop a rash.  You have a fever. Get help right away if:  You have trouble breathing. This information is not intended to replace advice given to you by your health care provider. Make sure you discuss any questions you have with your health care provider. Document Revised: 06/13/2017 Document Reviewed: 10/21/2015 Elsevier Patient Education  2020 [employer].
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
## 2020-06-30 ENCOUNTER — Other Ambulatory Visit (HOSPITAL_COMMUNITY): Payer: Medicare Other

## 2020-07-10 ENCOUNTER — Encounter (HOSPITAL_COMMUNITY): Payer: Self-pay | Admitting: Oncology

## 2020-07-11 ENCOUNTER — Inpatient Hospital Stay (HOSPITAL_BASED_OUTPATIENT_CLINIC_OR_DEPARTMENT_OTHER): Payer: Medicare Other | Admitting: Oncology

## 2020-07-11 ENCOUNTER — Other Ambulatory Visit: Payer: Self-pay

## 2020-07-11 ENCOUNTER — Telehealth: Payer: Self-pay

## 2020-07-11 VITALS — BP 163/74 | HR 65 | Temp 97.8°F | Resp 17 | Ht 73.0 in | Wt 159.1 lb

## 2020-07-11 DIAGNOSIS — C9001 Multiple myeloma in remission: Secondary | ICD-10-CM | POA: Diagnosis not present

## 2020-07-11 DIAGNOSIS — C9002 Multiple myeloma in relapse: Secondary | ICD-10-CM | POA: Diagnosis not present

## 2020-07-11 NOTE — Progress Notes (Signed)
START ON PATHWAY REGIMEN - Multiple Myeloma and Other Plasma Cell Dyscrasias     Cycle 1: A cycle is 28 days:     Dexamethasone      Carfilzomib      Carfilzomib      Daratumumab and hyaluronidase-fihj    Cycle 2: A cycle is 28 days:     Dexamethasone      Carfilzomib      Daratumumab and hyaluronidase-fihj    Cycles 3 through 6: A cycle is every 28 days:     Dexamethasone      Dexamethasone      Carfilzomib      Daratumumab and hyaluronidase-fihj    Cycles 7 and beyond: A cycle is every 28 days:     Dexamethasone      Dexamethasone      Carfilzomib      Daratumumab and hyaluronidase-fihj   **Always confirm dose/schedule in your pharmacy ordering system**  Patient Characteristics: Multiple Myeloma, Relapsed / Refractory, Second through Fourth Lines of Therapy, Fit or Candidate for Triplet Therapy, Lenalidomide-Refractory or Lenalidomide-based Regimen Not Preferred, Candidate for Anti-CD38 Antibody Disease Classification: Multiple Myeloma R-ISS Staging: II Therapeutic Status: Relapsed Line of Therapy: Second Line Anti-CD38 Antibody Candidacy: Candidate for Anti-CD38 Antibody Lenalidomide-based Regimen Preference/Candidacy: Lenalidomide-based Regimen Not Preferred Intent of Therapy: Non-Curative / Palliative Intent, Discussed with Patient

## 2020-07-11 NOTE — Telephone Encounter (Signed)
Patient scheduled for a bone survey 07/13/20 at 2:00 with arrival at 1:45. Spoke to patient and his wife and they verbalized understanding of appointment day, time, and location. Patient did not show up for bone survey that was scheduled for 06/30/20. Per patient's wife, she forgot about the appointment.

## 2020-07-11 NOTE — Progress Notes (Signed)
Hematology and Oncology Follow Up Visit  Travis Nguyen [id number] 01/28/1951 69 y.o. 07/11/2020 3:11 PM [name], [name], MD   Principle Diagnosis: 69 year old man with IgG kappa multiple myeloma diagnosed in 2014.  He was found to have 23% plasma cell involvement in his bone marrow.  He developed relapsed disease in December 2021.  Secondary diagnosis: Stage a T1c, Gleason score 3+4 = 7 prostate cancer that is currently in remission.  Prior Therapy: He was treated initially with Cytoxan, Velcade and Decadron and subsequently his regimen changed to a Velcade, Revlimid with dexamethasone.  He achieved remission at that time.   He is status post a robotic-assisted laparoscopic radical prostatectomy and bilateral lymph node dissection on 02/09/2014. The final pathology showed prostate adenocarcinoma Gleason score 4+3 equals 7 involving both lobes.    Current therapy: Under evaluation start salvage therapy.  Interim History:  Mr. Travis Nguyen presents today for routine follow-up.  Since the last visit, he underwent bone marrow biopsy which showed confirmed the presence of relapsed multiple myeloma.  Clinically, he reports no major complaints at this time.  He does report some fatigue and tiredness and decline in his appetite but has not reported any worsening bone pain or pathological fractures.  He denies any recent hospitalization or illnesses.  He denies any diarrhea or vomiting.      Medications: Unchanged on review. Current Outpatient Medications  Medication Sig Dispense Refill  . HYDROcodone-acetaminophen (NORCO/VICODIN) 5-325 MG tablet Take 1 tablet by mouth every 6 (six) hours as needed for moderate pain. (Patient not taking: Reported on 11/12/2017) 60 tablet 0  . lidocaine-prilocaine (EMLA) cream APPLY TOPICALLY TO PORT-A-CATH DAILY AS NEEDED 30 g 2  . losartan (COZAAR) 100 MG tablet Take 100 mg by mouth every morning.     . methocarbamol (ROBAXIN) 500 MG tablet Take  1 tablet (500 mg total) by mouth 2 (two) times daily. 20 tablet 0  . oxyCODONE-acetaminophen (PERCOCET) 10-325 MG tablet Take 1 tablet by mouth as needed.  0  . rivaroxaban (XARELTO) 20 MG TABS tablet Take 1 tablet (20 mg total) by mouth daily with supper. (Patient not taking: Reported on 11/12/2017) 30 tablet 0   No current facility-administered medications for this visit.     Allergies:  Allergies  Allergen Reactions  . Aspirin     Pt stated "raises BP"        Physical Exam: Blood pressure (!) 163/74, pulse 65, temperature 97.8 F (36.6 C), temperature source Tympanic, resp. rate 17, height 6' 1"  (1.854 m), weight 159 lb 1.6 oz (72.2 kg), SpO2 100 %.   ECOG: 1     General appearance: Comfortable appearing without any discomfort Head: Normocephalic without any trauma Oropharynx: Mucous membranes are moist and pink without any thrush or ulcers. Eyes: Pupils are equal and round reactive to light. Lymph nodes: No cervical, supraclavicular, inguinal or axillary lymphadenopathy.   Heart:regular rate and rhythm.  S1 and S2 without leg edema. Lung: Clear without any rhonchi or wheezes.  No dullness to percussion. Abdomin: Soft, nontender, nondistended with good bowel sounds.  No hepatosplenomegaly. Musculoskeletal: No joint deformity or effusion.  Full range of motion noted. Neurological: No deficits noted on motor, sensory and deep tendon reflex exam. Skin: No petechial rash or dryness.  Appeared moist.       Lab Results: Lab Results  Component Value Date   WBC 3.3 (L) 06/29/2020   HGB 12.2 (L) 06/29/2020   HCT 36.5 (L) 06/29/2020   MCV 97.6 06/29/2020  PLT 162 06/29/2020     Chemistry      Component Value Date/Time   NA 135 06/13/2020 0804   NA 138 03/27/2017 0838   K 4.3 06/13/2020 0804   K 4.3 03/27/2017 0838   CL 106 06/13/2020 0804   CL 105 01/01/2013 0835   CO2 23 06/13/2020 0804   CO2 24 03/27/2017 0838   BUN 17 06/13/2020 0804   BUN 23.7 03/27/2017  0838   CREATININE 1.02 06/13/2020 0804   CREATININE 1.2 03/27/2017 0838      Component Value Date/Time   CALCIUM 9.1 06/13/2020 0804   CALCIUM 9.9 03/27/2017 0838   ALKPHOS 71 06/13/2020 0804   ALKPHOS 79 03/27/2017 0838   AST 18 06/13/2020 0804   AST 25 03/27/2017 0838   ALT 24 06/13/2020 0804   ALT 26 03/27/2017 0838   BILITOT 0.4 06/13/2020 0804   BILITOT 0.31 03/27/2017 0838      Results for [name], [name] (MRN [id number]) as of 07/11/2020 14:43  Ref. Range 11/16/2019 10:47 06/13/2020 08:04  M Protein SerPl Elph-Mcnc Latest Ref Range: Not Observed g/dL 1.0 (H) 1.8 (H)        Impression and Plan:  69 year old man with:   1.  IgG kappa multiple myeloma diagnosed in 2014 and now has relapsed disease documented in December 2021.  Bone marrow biopsy obtained on June 29, 2020 confirmed the presence of 20 to 30% plasma cell involvement.  Skeletal survey was not completed at this time.  Treatment options were discussed at this time.  He was initially treated with Velcade, Revlimid and dexamethasone and achieved a complete response for the last 8 years or so.  Risks and benefits of starting salvage therapy were discussed and different treatment options were reviewed.  Daratumumab based therapy is recommended at this time with triple therapy is reasonable given his excellent performance status.  Adding Kyprolis with dexamethasone is appropriate at this time.  Complication associated with this treatment include nausea, vomiting, suppression and infusion related complications.  After discussion he is agreeable to proceed in the near future.   2.  Prostate cancer: No evidence of relapse at this time.  His PSA continues to be undetectable.   3.  IV access: Port-A-Cath will be in use for future treatment.  4.  Antiemetics: Prescription for Compazine will be available to him.   5. Followup: Will be in the next few weeks for the start of therapy.  30  minutes were dedicated to  this encounter.  Time was spent on reviewing disease status, discussing treatment options and future plan of care review.    Zola Button, MD 12/28/20213:11 PM

## 2020-07-12 ENCOUNTER — Other Ambulatory Visit: Payer: Self-pay | Admitting: Oncology

## 2020-07-12 ENCOUNTER — Telehealth: Payer: Self-pay | Admitting: Oncology

## 2020-07-12 ENCOUNTER — Encounter (HOSPITAL_COMMUNITY): Payer: Self-pay | Admitting: Oncology

## 2020-07-12 DIAGNOSIS — C9001 Multiple myeloma in remission: Secondary | ICD-10-CM

## 2020-07-12 NOTE — Telephone Encounter (Signed)
Scheduled per 12/28 los, called patient's cell and no voicemail was set up. Called and spoke with patient's wife, she will notify him regarding his appointments. Calender will be mailed.

## 2020-07-13 ENCOUNTER — Ambulatory Visit (HOSPITAL_COMMUNITY)
Admission: RE | Admit: 2020-07-13 | Discharge: 2020-07-13 | Disposition: A | Discharge status: Home / Self Care | Payer: Medicare Other | Source: Ambulatory Visit | Attending: Oncology | Admitting: Oncology

## 2020-07-13 ENCOUNTER — Other Ambulatory Visit: Payer: Self-pay

## 2020-07-13 DIAGNOSIS — C9000 Multiple myeloma not having achieved remission: Secondary | ICD-10-CM | POA: Diagnosis present

## 2020-07-17 LAB — SURGICAL PATHOLOGY

## 2020-07-24 ENCOUNTER — Other Ambulatory Visit (HOSPITAL_COMMUNITY): Payer: Self-pay | Admitting: Family Medicine

## 2020-07-24 DIAGNOSIS — M5416 Radiculopathy, lumbar region: Secondary | ICD-10-CM | POA: Diagnosis not present

## 2020-07-24 DIAGNOSIS — I10 Essential (primary) hypertension: Secondary | ICD-10-CM | POA: Diagnosis not present

## 2020-07-24 MED FILL — ESCITALOPRAM 10 MG TABLET: 10 | 90 days supply | Qty: 90 | Fill #0

## 2020-07-27 MED FILL — OXYCODONE-APAP 10-325: 10-325 | 30 days supply | Qty: 60 | Fill #0

## 2020-07-28 ENCOUNTER — Other Ambulatory Visit: Payer: Self-pay

## 2020-07-28 ENCOUNTER — Inpatient Hospital Stay: Payer: Medicare Other

## 2020-07-28 ENCOUNTER — Inpatient Hospital Stay: Payer: Medicare Other | Attending: Oncology

## 2020-07-28 VITALS — BP 138/73 | HR 67 | Temp 99.0°F | Resp 17 | Wt 157.2 lb

## 2020-07-28 DIAGNOSIS — Z5112 Encounter for antineoplastic immunotherapy: Secondary | ICD-10-CM | POA: Diagnosis not present

## 2020-07-28 DIAGNOSIS — R630 Anorexia: Secondary | ICD-10-CM | POA: Insufficient documentation

## 2020-07-28 DIAGNOSIS — C9001 Multiple myeloma in remission: Secondary | ICD-10-CM

## 2020-07-28 DIAGNOSIS — Z79899 Other long term (current) drug therapy: Secondary | ICD-10-CM | POA: Diagnosis not present

## 2020-07-28 DIAGNOSIS — Z95828 Presence of other vascular implants and grafts: Secondary | ICD-10-CM

## 2020-07-28 DIAGNOSIS — R5383 Other fatigue: Secondary | ICD-10-CM | POA: Insufficient documentation

## 2020-07-28 DIAGNOSIS — C9002 Multiple myeloma in relapse: Secondary | ICD-10-CM | POA: Diagnosis not present

## 2020-07-28 DIAGNOSIS — Z8546 Personal history of malignant neoplasm of prostate: Secondary | ICD-10-CM | POA: Diagnosis not present

## 2020-07-28 DIAGNOSIS — C61 Malignant neoplasm of prostate: Secondary | ICD-10-CM

## 2020-07-28 LAB — CBC WITH DIFFERENTIAL (CANCER CENTER ONLY)
Abs Immature Granulocytes: 0.01 10*3/uL (ref 0.00–0.07)
Basophils Absolute: 0 10*3/uL (ref 0.0–0.1)
Basophils Relative: 0 %
Eosinophils Absolute: 0 10*3/uL (ref 0.0–0.5)
Eosinophils Relative: 0 %
HCT: 32.4 % — ABNORMAL LOW (ref 39.0–52.0)
Hemoglobin: 11 g/dL — ABNORMAL LOW (ref 13.0–17.0)
Immature Granulocytes: 0 %
Lymphocytes Relative: 46 %
Lymphs Abs: 1.3 10*3/uL (ref 0.7–4.0)
MCH: 33 pg (ref 26.0–34.0)
MCHC: 34 g/dL (ref 30.0–36.0)
MCV: 97.3 fL (ref 80.0–100.0)
Monocytes Absolute: 0.2 10*3/uL (ref 0.1–1.0)
Monocytes Relative: 6 %
Neutro Abs: 1.4 10*3/uL — ABNORMAL LOW (ref 1.7–7.7)
Neutrophils Relative %: 48 %
Platelet Count: 166 10*3/uL (ref 150–400)
RBC: 3.33 MIL/uL — ABNORMAL LOW (ref 4.22–5.81)
RDW: 13.3 % (ref 11.5–15.5)
WBC Count: 2.9 10*3/uL — ABNORMAL LOW (ref 4.0–10.5)
nRBC: 0 % (ref 0.0–0.2)

## 2020-07-28 LAB — CMP (CANCER CENTER ONLY)
ALT: 22 U/L (ref 0–44)
AST: 21 U/L (ref 15–41)
Albumin: 3.9 g/dL (ref 3.5–5.0)
Alkaline Phosphatase: 71 U/L (ref 38–126)
Anion gap: 5 (ref 5–15)
BUN: 12 mg/dL (ref 8–23)
CO2: 26 mmol/L (ref 22–32)
Calcium: 8.9 mg/dL (ref 8.9–10.3)
Chloride: 106 mmol/L (ref 98–111)
Creatinine: 0.98 mg/dL (ref 0.61–1.24)
GFR, Estimated: >60 mL/min (ref >60)
Glucose, Bld: 82 mg/dL (ref 70–99)
Potassium: 4.3 mmol/L (ref 3.5–5.1)
Sodium: 137 mmol/L (ref 135–145)
Total Bilirubin: 0.4 mg/dL (ref 0.3–1.2)
Total Protein: 8.6 g/dL — ABNORMAL HIGH (ref 6.5–8.1)

## 2020-07-28 LAB — TYPE AND SCREEN
ABO/RH(D): B NEG
Antibody Screen: NEGATIVE
Weak D: POSITIVE

## 2020-07-28 LAB — PRETREATMENT RBC PHENOTYPE

## 2020-07-28 MED ORDER — MONTELUKAST SODIUM 10 MG PO TABS
ORAL_TABLET | ORAL | Status: AC
  Filled 2020-07-28: qty 1

## 2020-07-28 MED ORDER — DEXTROSE 5 % IV SOLN
20.0000 mg/m2 | Freq: Once | INTRAVENOUS | Status: AC
  Administered 2020-07-28: 40 mg via INTRAVENOUS
  Filled 2020-07-28: qty 5

## 2020-07-28 MED ORDER — HEPARIN SOD (PORK) LOCK FLUSH 100 UNIT/ML IV SOLN
500.0000 [IU] | Freq: Once | INTRAVENOUS | Status: AC | PRN
  Administered 2020-07-28: 500 [IU]
  Filled 2020-07-28: qty 5

## 2020-07-28 MED ORDER — ACETAMINOPHEN 325 MG PO TABS
650.0000 mg | ORAL_TABLET | Freq: Once | ORAL | Status: AC
  Administered 2020-07-28: 650 mg via ORAL

## 2020-07-28 MED ORDER — DARATUMUMAB-HYALURONIDASE-FIHJ 1800-30000 MG-UT/15ML ~~LOC~~ SOLN
1800.0000 mg | Freq: Once | SUBCUTANEOUS | Status: AC
  Administered 2020-07-28: 1800 mg via SUBCUTANEOUS
  Filled 2020-07-28: qty 15

## 2020-07-28 MED ORDER — SODIUM CHLORIDE 0.9 % IV SOLN
40.0000 mg | Freq: Once | INTRAVENOUS | Status: AC
  Administered 2020-07-28: 40 mg via INTRAVENOUS
  Filled 2020-07-28: qty 4

## 2020-07-28 MED ORDER — SODIUM CHLORIDE 0.9 % IV SOLN
Freq: Once | INTRAVENOUS | Status: DC
  Filled 2020-07-28: qty 250

## 2020-07-28 MED ORDER — SODIUM CHLORIDE 0.9% FLUSH
10.0000 mL | INTRAVENOUS | Status: DC | PRN
  Administered 2020-07-28: 10 mL
  Filled 2020-07-28: qty 10

## 2020-07-28 MED ORDER — MONTELUKAST SODIUM 10 MG PO TABS
10.0000 mg | ORAL_TABLET | Freq: Once | ORAL | Status: AC
  Administered 2020-07-28: 10 mg via ORAL

## 2020-07-28 MED ORDER — DIPHENHYDRAMINE HCL 25 MG PO CAPS
50.0000 mg | ORAL_CAPSULE | Freq: Once | ORAL | Status: AC
  Administered 2020-07-28: 50 mg via ORAL

## 2020-07-28 MED ORDER — DIPHENHYDRAMINE HCL 25 MG PO CAPS
ORAL_CAPSULE | ORAL | Status: AC
  Filled 2020-07-28: qty 2

## 2020-07-28 MED ORDER — SODIUM CHLORIDE 0.9 % IV SOLN
Freq: Once | INTRAVENOUS | Status: AC
Start: 2020-07-28 — End: 2020-07-28
  Filled 2020-07-28: qty 250

## 2020-07-28 MED ORDER — ACETAMINOPHEN 325 MG PO TABS
ORAL_TABLET | ORAL | Status: AC
  Filled 2020-07-28: qty 2

## 2020-07-28 NOTE — Progress Notes (Signed)
Per [name], ok to treat with ANC 1.4.   Patient observed for 2 hours post Darzalex faspro injection with no issues. VSS upon leaving infusion room.

## 2020-07-28 NOTE — Patient Instructions (Signed)
[facility] Discharge Instructions for Patients Receiving Chemotherapy  Today you received the following chemotherapy agents: carfilzomib, Daratumumab hyaluronidase.  To help prevent nausea and vomiting after your treatment, we encourage you to take your nausea medication as prescribed by your physician.    If you develop nausea and vomiting that is not controlled by your nausea medication, call the clinic.   BELOW ARE SYMPTOMS THAT SHOULD BE REPORTED IMMEDIATELY:  *FEVER GREATER THAN 100.5 F  *CHILLS WITH OR WITHOUT FEVER  NAUSEA AND VOMITING THAT IS NOT CONTROLLED WITH YOUR NAUSEA MEDICATION  *UNUSUAL SHORTNESS OF BREATH  *UNUSUAL BRUISING OR BLEEDING  TENDERNESS IN MOUTH AND THROAT WITH OR WITHOUT PRESENCE OF ULCERS  *URINARY PROBLEMS  *BOWEL PROBLEMS  UNUSUAL RASH Items with * indicate a potential emergency and should be followed up as soon as possible.  Feel free to call the clinic should you have any questions or concerns. The clinic phone number is [phone].  Please show the [facility] at check-in to the Emergency Department and triage nurse.  Carfilzomib injection What is this medicine? CARFILZOMIB (kar FILZ oh mib) targets a specific protein within cancer cells and stops the cancer cells from growing. It is used to treat multiple myeloma. This medicine may be used for other purposes; ask your health care provider or pharmacist if you have questions. COMMON BRAND NAME(S): KYPROLIS What should I tell my health care provider before I take this medicine? They need to know if you have any of these conditions:  heart disease  history of blood clots  irregular heartbeat  kidney disease  liver disease  lung or breathing disease  an unusual or allergic reaction to carfilzomib, or other medicines, foods, dyes, or preservatives  pregnant or trying to get pregnant  breast-feeding How should I use this medicine? This medicine is for  injection or infusion into a vein. It is given by a health care professional in a hospital or clinic setting. Talk to your pediatrician regarding the use of this medicine in children. Special care may be needed. Overdosage: If you think you have taken too much of this medicine contact a poison control center or emergency room at once. NOTE: This medicine is only for you. Do not share this medicine with others. What if I miss a dose? It is important not to miss your dose. Call your doctor or health care professional if you are unable to keep an appointment. What may interact with this medicine? Interactions are not expected. Give your health care provider a list of all the medicines, herbs, non-prescription drugs, or dietary supplements you use. Also tell them if you smoke, drink alcohol, or use illegal drugs. Some items may interact with your medicine. This list may not describe all possible interactions. Give your health care provider a list of all the medicines, herbs, non-prescription drugs, or dietary supplements you use. Also tell them if you smoke, drink alcohol, or use illegal drugs. Some items may interact with your medicine. What should I watch for while using this medicine? Your condition will be monitored carefully while you are receiving this medicine. Report any side effects. Continue your course of treatment even though you feel ill unless your doctor tells you to stop. You may need blood work done while you are taking this medicine. Do not become pregnant while taking this medicine or for at least 6 months after stopping it. Women should inform their doctor if they wish to become pregnant or think they  might be pregnant. There is a potential for serious side effects to an unborn child. Men should not father a child while taking this medicine and for at least 3 months after stopping it. Talk to your health care professional or pharmacist for more information. Do not breast-feed an infant  while taking this medicine or for 2 weeks after the last dose. Check with your doctor or health care professional if you get an attack of severe diarrhea, nausea and vomiting, or if you sweat a lot. The loss of too much body fluid can make it dangerous for you to take this medicine. You may get dizzy. Do not drive, use machinery, or do anything that needs mental alertness until you know how this medicine affects you. Do not stand or sit up quickly, especially if you are an older patient. This reduces the risk of dizzy or fainting spells. What side effects may I notice from receiving this medicine? Side effects that you should report to your doctor or health care professional as soon as possible:  allergic reactions like skin rash, itching or hives, swelling of the face, lips, or tongue  confusion  dizziness  feeling faint or lightheaded  fever or chills  palpitations  seizures  signs and symptoms of bleeding such as bloody or black, tarry stools; red or dark-brown urine; spitting up blood or brown material that looks like coffee grounds; red spots on the skin; unusual bruising or bleeding including from the eye, gums, or nose  signs and symptoms of a blood clot such as breathing problems; changes in vision; chest pain; severe, sudden headache; pain, swelling, warmth in the leg; trouble speaking; sudden numbness or weakness of the face, arm or leg  signs and symptoms of kidney injury like trouble passing urine or change in the amount of urine  signs and symptoms of liver injury like dark yellow or brown urine; general ill feeling or flu-like symptoms; light-colored stools; loss of appetite; nausea; right upper belly pain; unusually weak or tired; yellowing of the eyes or skin Side effects that usually do not require medical attention (report to your doctor or health care professional if they continue or are bothersome):  back pain  cough  diarrhea  headache  muscle  cramps  trouble sleeping  vomiting This list may not describe all possible side effects. Call your doctor for medical advice about side effects. You may report side effects to FDA at 1-800-FDA-1088. Where should I keep my medicine? This drug is given in a hospital or clinic and will not be stored at home. NOTE: This sheet is a summary. It may not cover all possible information. If you have questions about this medicine, talk to your doctor, pharmacist, or health care provider.  2021 Elsevier/Gold Standard (2019-03-08 19:44:21)  Daratumumab injection What is this medicine? DARATUMUMAB (dar a toom ue mab) is a monoclonal antibody. It is used to treat multiple myeloma. This medicine may be used for other purposes; ask your health care provider or pharmacist if you have questions. COMMON BRAND NAME(S): DARZALEX What should I tell my health care provider before I take this medicine? They need to know if you have any of these conditions:  hereditary fructose intolerance  infection (especially a virus infection such as chickenpox, herpes, or hepatitis B virus)  lung or breathing disease (asthma, COPD)  an unusual or allergic reaction to daratumumab, sorbitol, other medicines, foods, dyes, or preservatives  pregnant or trying to get pregnant  breast-feeding How should I use  this medicine? This medicine is for infusion into a vein. It is given by a health care professional in a hospital or clinic setting. Talk to your pediatrician regarding the use of this medicine in children. Special care may be needed. Overdosage: If you think you have taken too much of this medicine contact a poison control center or emergency room at once. NOTE: This medicine is only for you. Do not share this medicine with others. What if I miss a dose? Keep appointments for follow-up doses as directed. It is important not to miss your dose. Call your doctor or health care professional if you are unable to keep an  appointment. What may interact with this medicine? Interactions have not been studied. This list may not describe all possible interactions. Give your health care provider a list of all the medicines, herbs, non-prescription drugs, or dietary supplements you use. Also tell them if you smoke, drink alcohol, or use illegal drugs. Some items may interact with your medicine. What should I watch for while using this medicine? Your condition will be monitored carefully while you are receiving this medicine. This medicine can cause serious allergic reactions. To reduce your risk, your health care provider may give you other medicine to take before receiving this one. Be sure to follow the directions from your health care provider. This medicine can affect the results of blood tests to match your blood type. These changes can last for up to 6 months after the final dose. Your healthcare provider will do blood tests to match your blood type before you start treatment. Tell all of your healthcare providers that you are being treated with this medicine before receiving a blood transfusion. This medicine can affect the results of some tests used to determine treatment response; extra tests may be needed to evaluate response. Do not become pregnant while taking this medicine or for 3 months after stopping it. Women should inform their health care provider if they wish to become pregnant or think they might be pregnant. There is a potential for serious side effects to an unborn child. Talk to your health care provider for more information. Do not breast-feed an infant while taking this medicine. What side effects may I notice from receiving this medicine? Side effects that you should report to your doctor or health care professional as soon as possible:  allergic reactions (skin rash; itching or hives; swelling of the face, lips, or tongue)  infection (fever, chills, cough, sore throat, pain or difficulty passing  urine)  infusion reaction (dizziness, fast heartbeat, feeling faint or lightheaded, falls, headache, increase in blood pressure, nausea, vomiting, or wheezing or trouble breathing with loud or whistling sounds)  unusual bleeding or bruising Side effects that usually do not require medical attention (report to your doctor or health care professional if they continue or are bothersome):  constipation  diarrhea  pain, tingling, numbness in the hands or feet  swelling of the ankles, feet, hands  tiredness This list may not describe all possible side effects. Call your doctor for medical advice about side effects. You may report side effects to FDA at 1-800-FDA-1088. Where should I keep my medicine? This drug is given in a hospital or clinic and will not be stored at home. NOTE: This sheet is a summary. It may not cover all possible information. If you have questions about this medicine, talk to your doctor, pharmacist, or health care provider.  2021 Elsevier/Gold Standard (2020-06-22 13:28:52)

## 2020-07-28 NOTE — Patient Instructions (Signed)

## 2020-08-01 MED FILL — LIDOCAINE-PRILOCAINE CREAM: 2.5-2.5 | 30 days supply | Qty: 30 | Fill #1

## 2020-08-03 ENCOUNTER — Inpatient Hospital Stay: Payer: Medicare Other

## 2020-08-03 ENCOUNTER — Other Ambulatory Visit: Payer: Self-pay

## 2020-08-03 VITALS — BP 148/74 | HR 75 | Temp 98.8°F | Resp 20 | Ht 73.0 in | Wt 159.5 lb

## 2020-08-03 DIAGNOSIS — Z79899 Other long term (current) drug therapy: Secondary | ICD-10-CM | POA: Diagnosis not present

## 2020-08-03 DIAGNOSIS — R5383 Other fatigue: Secondary | ICD-10-CM | POA: Diagnosis not present

## 2020-08-03 DIAGNOSIS — C61 Malignant neoplasm of prostate: Secondary | ICD-10-CM

## 2020-08-03 DIAGNOSIS — Z5112 Encounter for antineoplastic immunotherapy: Secondary | ICD-10-CM | POA: Diagnosis not present

## 2020-08-03 DIAGNOSIS — C9001 Multiple myeloma in remission: Secondary | ICD-10-CM

## 2020-08-03 DIAGNOSIS — Z95828 Presence of other vascular implants and grafts: Secondary | ICD-10-CM

## 2020-08-03 DIAGNOSIS — C9002 Multiple myeloma in relapse: Secondary | ICD-10-CM | POA: Diagnosis not present

## 2020-08-03 LAB — CMP (CANCER CENTER ONLY)
ALT: 22 U/L (ref 0–44)
AST: 18 U/L (ref 15–41)
Albumin: 3.8 g/dL (ref 3.5–5.0)
Alkaline Phosphatase: 71 U/L (ref 38–126)
Anion gap: 6 (ref 5–15)
BUN: 14 mg/dL (ref 8–23)
CO2: 25 mmol/L (ref 22–32)
Calcium: 8.9 mg/dL (ref 8.9–10.3)
Chloride: 105 mmol/L (ref 98–111)
Creatinine: 0.99 mg/dL (ref 0.61–1.24)
GFR, Estimated: >60 mL/min (ref >60)
Glucose, Bld: 95 mg/dL (ref 70–99)
Potassium: 4.4 mmol/L (ref 3.5–5.1)
Sodium: 136 mmol/L (ref 135–145)
Total Bilirubin: 0.3 mg/dL (ref 0.3–1.2)
Total Protein: 8.2 g/dL — ABNORMAL HIGH (ref 6.5–8.1)

## 2020-08-03 LAB — CBC WITH DIFFERENTIAL (CANCER CENTER ONLY)
Abs Immature Granulocytes: 0.01 10*3/uL (ref 0.00–0.07)
Basophils Absolute: 0 10*3/uL (ref 0.0–0.1)
Basophils Relative: 0 %
Eosinophils Absolute: 0 10*3/uL (ref 0.0–0.5)
Eosinophils Relative: 0 %
HCT: 31.5 % — ABNORMAL LOW (ref 39.0–52.0)
Hemoglobin: 10.8 g/dL — ABNORMAL LOW (ref 13.0–17.0)
Immature Granulocytes: 0 %
Lymphocytes Relative: 54 %
Lymphs Abs: 1.3 10*3/uL (ref 0.7–4.0)
MCH: 32.6 pg (ref 26.0–34.0)
MCHC: 34.3 g/dL (ref 30.0–36.0)
MCV: 95.2 fL (ref 80.0–100.0)
Monocytes Absolute: 0.1 10*3/uL (ref 0.1–1.0)
Monocytes Relative: 6 %
Neutro Abs: 1 10*3/uL — ABNORMAL LOW (ref 1.7–7.7)
Neutrophils Relative %: 40 %
Platelet Count: 155 10*3/uL (ref 150–400)
RBC: 3.31 MIL/uL — ABNORMAL LOW (ref 4.22–5.81)
RDW: 13.5 % (ref 11.5–15.5)
WBC Count: 2.4 10*3/uL — ABNORMAL LOW (ref 4.0–10.5)
nRBC: 0 % (ref 0.0–0.2)

## 2020-08-03 MED ORDER — SODIUM CHLORIDE 0.9 % IV SOLN
Freq: Once | INTRAVENOUS | Status: AC
  Filled 2020-08-03: qty 250

## 2020-08-03 MED ORDER — HEPARIN SOD (PORK) LOCK FLUSH 100 UNIT/ML IV SOLN
500.0000 [IU] | Freq: Once | INTRAVENOUS | Status: AC | PRN
  Administered 2020-08-03: 500 [IU]
  Filled 2020-08-03: qty 5

## 2020-08-03 MED ORDER — MONTELUKAST SODIUM 10 MG PO TABS
ORAL_TABLET | ORAL | Status: AC
  Filled 2020-08-03: qty 1

## 2020-08-03 MED ORDER — ACETAMINOPHEN 325 MG PO TABS
650.0000 mg | ORAL_TABLET | Freq: Once | ORAL | Status: AC
  Administered 2020-08-03: 650 mg via ORAL

## 2020-08-03 MED ORDER — SODIUM CHLORIDE 0.9 % IV SOLN
40.0000 mg | Freq: Once | INTRAVENOUS | Status: AC
  Administered 2020-08-03: 40 mg via INTRAVENOUS
  Filled 2020-08-03: qty 4

## 2020-08-03 MED ORDER — SODIUM CHLORIDE 0.9% FLUSH
10.0000 mL | INTRAVENOUS | Status: DC | PRN
  Administered 2020-08-03: 10 mL
  Filled 2020-08-03: qty 10

## 2020-08-03 MED ORDER — CARFILZOMIB CHEMO INJECTION 60 MG
70.0000 mg/m2 | Freq: Once | INTRAVENOUS | Status: AC
  Administered 2020-08-03: 140 mg via INTRAVENOUS
  Filled 2020-08-03: qty 60

## 2020-08-03 MED ORDER — MONTELUKAST SODIUM 10 MG PO TABS
10.0000 mg | ORAL_TABLET | Freq: Once | ORAL | Status: AC
  Administered 2020-08-03: 10 mg via ORAL

## 2020-08-03 MED ORDER — DARATUMUMAB-HYALURONIDASE-FIHJ 1800-30000 MG-UT/15ML ~~LOC~~ SOLN
1800.0000 mg | Freq: Once | SUBCUTANEOUS | Status: AC
  Administered 2020-08-03: 1800 mg via SUBCUTANEOUS
  Filled 2020-08-03: qty 15

## 2020-08-03 MED ORDER — ACETAMINOPHEN 325 MG PO TABS
ORAL_TABLET | ORAL | Status: AC
  Filled 2020-08-03: qty 2

## 2020-08-03 MED ORDER — DIPHENHYDRAMINE HCL 25 MG PO CAPS
ORAL_CAPSULE | ORAL | Status: AC
  Filled 2020-08-03: qty 2

## 2020-08-03 MED ORDER — DIPHENHYDRAMINE HCL 25 MG PO CAPS
50.0000 mg | ORAL_CAPSULE | Freq: Once | ORAL | Status: AC
  Administered 2020-08-03: 50 mg via ORAL

## 2020-08-03 NOTE — Patient Instructions (Addendum)
[facility] Discharge Instructions for Patients Receiving Chemotherapy  Today you received the following chemotherapy agents: Carfilzomib (Kyprolis) and Daratumumab (Darzalex Faspro).  To help prevent nausea and vomiting after your treatment, we encourage you to take your nausea medication as directed by your MD.   If you develop nausea and vomiting that is not controlled by your nausea medication, call the clinic.   BELOW ARE SYMPTOMS THAT SHOULD BE REPORTED IMMEDIATELY:  *FEVER GREATER THAN 100.5 F  *CHILLS WITH OR WITHOUT FEVER  NAUSEA AND VOMITING THAT IS NOT CONTROLLED WITH YOUR NAUSEA MEDICATION  *UNUSUAL SHORTNESS OF BREATH  *UNUSUAL BRUISING OR BLEEDING  TENDERNESS IN MOUTH AND THROAT WITH OR WITHOUT PRESENCE OF ULCERS  *URINARY PROBLEMS  *BOWEL PROBLEMS  UNUSUAL RASH Items with * indicate a potential emergency and should be followed up as soon as possible.  Feel free to call the clinic should you have any questions or concerns. The clinic phone number is [phone].  Please show the [facility] at check-in to the Emergency Department and triage nurse.

## 2020-08-03 NOTE — Progress Notes (Signed)
Confirmed with Dr Alen Blew ok to increase Kyprolis to 70 mg/m2 today with ANC 1  T.Jenetta Downer Dr Creta Levin, PharmD

## 2020-08-03 NOTE — Progress Notes (Signed)
Per [name], ok for treatment today with ANC 1.0. Pt. completed second treatment today and tolerated without difficulty. Stayed for 1 hour post observation after Darzalex Faspro. Vital signs stable and pt. stable for discharge.

## 2020-08-10 ENCOUNTER — Other Ambulatory Visit: Payer: Medicare Other

## 2020-08-10 ENCOUNTER — Other Ambulatory Visit: Payer: Self-pay

## 2020-08-10 ENCOUNTER — Inpatient Hospital Stay: Payer: Medicare Other

## 2020-08-10 ENCOUNTER — Encounter: Payer: Self-pay | Admitting: *Deleted

## 2020-08-10 ENCOUNTER — Ambulatory Visit: Payer: Medicare Other

## 2020-08-10 VITALS — BP 170/96 | HR 55 | Temp 98.4°F | Resp 18 | Wt 157.2 lb

## 2020-08-10 DIAGNOSIS — C9001 Multiple myeloma in remission: Secondary | ICD-10-CM

## 2020-08-10 DIAGNOSIS — Z5112 Encounter for antineoplastic immunotherapy: Secondary | ICD-10-CM | POA: Diagnosis not present

## 2020-08-10 DIAGNOSIS — C61 Malignant neoplasm of prostate: Secondary | ICD-10-CM

## 2020-08-10 DIAGNOSIS — R5383 Other fatigue: Secondary | ICD-10-CM | POA: Diagnosis not present

## 2020-08-10 DIAGNOSIS — Z79899 Other long term (current) drug therapy: Secondary | ICD-10-CM | POA: Diagnosis not present

## 2020-08-10 DIAGNOSIS — Z95828 Presence of other vascular implants and grafts: Secondary | ICD-10-CM

## 2020-08-10 DIAGNOSIS — C9002 Multiple myeloma in relapse: Secondary | ICD-10-CM | POA: Diagnosis not present

## 2020-08-10 LAB — CMP (CANCER CENTER ONLY)
ALT: 27 U/L (ref 0–44)
AST: 17 U/L (ref 15–41)
Albumin: 3.6 g/dL (ref 3.5–5.0)
Alkaline Phosphatase: 57 U/L (ref 38–126)
Anion gap: 7 (ref 5–15)
BUN: 18 mg/dL (ref 8–23)
CO2: 22 mmol/L (ref 22–32)
Calcium: 8.8 mg/dL — ABNORMAL LOW (ref 8.9–10.3)
Chloride: 106 mmol/L (ref 98–111)
Creatinine: 0.91 mg/dL (ref 0.61–1.24)
GFR, Estimated: >60 mL/min (ref >60)
Glucose, Bld: 91 mg/dL (ref 70–99)
Potassium: 4.6 mmol/L (ref 3.5–5.1)
Sodium: 135 mmol/L (ref 135–145)
Total Bilirubin: 0.4 mg/dL (ref 0.3–1.2)
Total Protein: 8.3 g/dL — ABNORMAL HIGH (ref 6.5–8.1)

## 2020-08-10 LAB — CBC WITH DIFFERENTIAL (CANCER CENTER ONLY)
Abs Immature Granulocytes: 0 10*3/uL (ref 0.00–0.07)
Basophils Absolute: 0 10*3/uL (ref 0.0–0.1)
Basophils Relative: 0 %
Eosinophils Absolute: 0 10*3/uL (ref 0.0–0.5)
Eosinophils Relative: 0 %
HCT: 32.4 % — ABNORMAL LOW (ref 39.0–52.0)
Hemoglobin: 10.9 g/dL — ABNORMAL LOW (ref 13.0–17.0)
Immature Granulocytes: 0 %
Lymphocytes Relative: 49 %
Lymphs Abs: 1.2 10*3/uL (ref 0.7–4.0)
MCH: 32.6 pg (ref 26.0–34.0)
MCHC: 33.6 g/dL (ref 30.0–36.0)
MCV: 97 fL (ref 80.0–100.0)
Monocytes Absolute: 0.1 10*3/uL (ref 0.1–1.0)
Monocytes Relative: 6 %
Neutro Abs: 1.1 10*3/uL — ABNORMAL LOW (ref 1.7–7.7)
Neutrophils Relative %: 45 %
Platelet Count: 107 10*3/uL — ABNORMAL LOW (ref 150–400)
RBC: 3.34 MIL/uL — ABNORMAL LOW (ref 4.22–5.81)
RDW: 12.8 % (ref 11.5–15.5)
WBC Count: 2.4 10*3/uL — ABNORMAL LOW (ref 4.0–10.5)
nRBC: 0 % (ref 0.0–0.2)

## 2020-08-10 MED ORDER — DIPHENHYDRAMINE HCL 25 MG PO CAPS
50.0000 mg | ORAL_CAPSULE | Freq: Once | ORAL | Status: AC
  Administered 2020-08-10: 50 mg via ORAL

## 2020-08-10 MED ORDER — SODIUM CHLORIDE 0.9% FLUSH
10.0000 mL | INTRAVENOUS | Status: DC | PRN
  Administered 2020-08-10: 10 mL
  Filled 2020-08-10: qty 10

## 2020-08-10 MED ORDER — ACETAMINOPHEN 325 MG PO TABS
650.0000 mg | ORAL_TABLET | Freq: Once | ORAL | Status: AC
  Administered 2020-08-10: 650 mg via ORAL

## 2020-08-10 MED ORDER — ACETAMINOPHEN 325 MG PO TABS
ORAL_TABLET | ORAL | Status: AC
  Filled 2020-08-10: qty 2

## 2020-08-10 MED ORDER — SODIUM CHLORIDE 0.9 % IV SOLN
40.0000 mg | Freq: Once | INTRAVENOUS | Status: AC
  Administered 2020-08-10: 40 mg via INTRAVENOUS
  Filled 2020-08-10: qty 4

## 2020-08-10 MED ORDER — DIPHENHYDRAMINE HCL 25 MG PO CAPS
ORAL_CAPSULE | ORAL | Status: AC
  Filled 2020-08-10: qty 2

## 2020-08-10 MED ORDER — HEPARIN SOD (PORK) LOCK FLUSH 100 UNIT/ML IV SOLN
500.0000 [IU] | Freq: Once | INTRAVENOUS | Status: AC | PRN
  Administered 2020-08-10: 500 [IU]
  Filled 2020-08-10: qty 5

## 2020-08-10 MED ORDER — SODIUM CHLORIDE 0.9 % IV SOLN
Freq: Once | INTRAVENOUS | Status: DC
  Filled 2020-08-10: qty 250

## 2020-08-10 MED ORDER — MONTELUKAST SODIUM 10 MG PO TABS
ORAL_TABLET | ORAL | Status: AC
  Filled 2020-08-10: qty 1

## 2020-08-10 MED ORDER — DEXTROSE 5 % IV SOLN
70.0000 mg/m2 | Freq: Once | INTRAVENOUS | Status: AC
  Administered 2020-08-10: 140 mg via INTRAVENOUS
  Filled 2020-08-10: qty 60

## 2020-08-10 MED ORDER — DARATUMUMAB-HYALURONIDASE-FIHJ 1800-30000 MG-UT/15ML ~~LOC~~ SOLN
1800.0000 mg | Freq: Once | SUBCUTANEOUS | Status: AC
  Administered 2020-08-10: 1800 mg via SUBCUTANEOUS
  Filled 2020-08-10: qty 15

## 2020-08-10 MED ORDER — SODIUM CHLORIDE 0.9 % IV SOLN
Freq: Once | INTRAVENOUS | Status: AC
Start: 2020-08-10 — End: 2020-08-10
  Filled 2020-08-10: qty 250

## 2020-08-10 MED ORDER — MONTELUKAST SODIUM 10 MG PO TABS
10.0000 mg | ORAL_TABLET | Freq: Once | ORAL | Status: AC
  Administered 2020-08-10: 10 mg via ORAL

## 2020-08-10 NOTE — Progress Notes (Signed)
[facility] Work  Clinical Social Work met with patient in [place] office. Patient shared his cancer has recurred and he is receiving treatment. He reported he receives [employer] retirement, but is in need of any available financial assistance. Patient has utilized all of [employer].  CSW provided patient with [place] card and LLS application for $115 assistance. Mr. Jeppsen plans to take form and bring back completed.   Gwinda Maine, LCSW  Clinical Social Worker [facility]

## 2020-08-10 NOTE — Patient Instructions (Signed)
[facility] Discharge Instructions for Patients Receiving Chemotherapy  Today you received the following chemotherapy agents: Kyprolis, Darzalex Faspro  To help prevent nausea and vomiting after your treatment, we encourage you to take your nausea medication as directed.    If you develop nausea and vomiting that is not controlled by your nausea medication, call the clinic.   BELOW ARE SYMPTOMS THAT SHOULD BE REPORTED IMMEDIATELY:  *FEVER GREATER THAN 100.5 F  *CHILLS WITH OR WITHOUT FEVER  NAUSEA AND VOMITING THAT IS NOT CONTROLLED WITH YOUR NAUSEA MEDICATION  *UNUSUAL SHORTNESS OF BREATH  *UNUSUAL BRUISING OR BLEEDING  TENDERNESS IN MOUTH AND THROAT WITH OR WITHOUT PRESENCE OF ULCERS  *URINARY PROBLEMS  *BOWEL PROBLEMS  UNUSUAL RASH Items with * indicate a potential emergency and should be followed up as soon as possible.  Feel free to call the clinic should you have any questions or concerns. The clinic phone number is [phone].  Please show the CHEMO ALERT CARD at check-in to the Emergency Department and triage nurse.   

## 2020-08-10 NOTE — Progress Notes (Signed)
Okay to treat with ANC 1.1 per Dr. [name] 

## 2020-08-14 ENCOUNTER — Telehealth: Payer: Self-pay | Admitting: Oncology

## 2020-08-14 NOTE — Telephone Encounter (Signed)
Rescheduled 02/03 appointment time, patient has been called and notified of upcoming appointment.

## 2020-08-14 NOTE — Telephone Encounter (Signed)
Called to reschedule 02/03 appointment time per patient's request, spoke with patient's wife but it was not confirmed if he wanted to move appointment time. I will try to contact again.

## 2020-08-17 ENCOUNTER — Ambulatory Visit: Payer: Medicare Other

## 2020-08-17 ENCOUNTER — Inpatient Hospital Stay: Payer: Medicare Other | Attending: Oncology

## 2020-08-17 ENCOUNTER — Inpatient Hospital Stay: Payer: Medicare Other

## 2020-08-17 ENCOUNTER — Other Ambulatory Visit: Payer: Medicare Other

## 2020-08-17 ENCOUNTER — Other Ambulatory Visit: Payer: Self-pay

## 2020-08-17 ENCOUNTER — Encounter: Payer: Self-pay | Admitting: *Deleted

## 2020-08-17 DIAGNOSIS — C9001 Multiple myeloma in remission: Secondary | ICD-10-CM

## 2020-08-17 DIAGNOSIS — C61 Malignant neoplasm of prostate: Secondary | ICD-10-CM

## 2020-08-17 DIAGNOSIS — Z8546 Personal history of malignant neoplasm of prostate: Secondary | ICD-10-CM | POA: Insufficient documentation

## 2020-08-17 DIAGNOSIS — D649 Anemia, unspecified: Secondary | ICD-10-CM | POA: Diagnosis not present

## 2020-08-17 DIAGNOSIS — Z79899 Other long term (current) drug therapy: Secondary | ICD-10-CM | POA: Insufficient documentation

## 2020-08-17 DIAGNOSIS — Z5112 Encounter for antineoplastic immunotherapy: Secondary | ICD-10-CM | POA: Insufficient documentation

## 2020-08-17 DIAGNOSIS — C9002 Multiple myeloma in relapse: Secondary | ICD-10-CM | POA: Insufficient documentation

## 2020-08-17 DIAGNOSIS — Z886 Allergy status to analgesic agent status: Secondary | ICD-10-CM | POA: Insufficient documentation

## 2020-08-17 DIAGNOSIS — Z7901 Long term (current) use of anticoagulants: Secondary | ICD-10-CM | POA: Diagnosis not present

## 2020-08-17 DIAGNOSIS — Z95828 Presence of other vascular implants and grafts: Secondary | ICD-10-CM

## 2020-08-17 LAB — CMP (CANCER CENTER ONLY)
ALT: 38 U/L (ref 0–44)
AST: 20 U/L (ref 15–41)
Albumin: 3.8 g/dL (ref 3.5–5.0)
Alkaline Phosphatase: 66 U/L (ref 38–126)
Anion gap: 7 (ref 5–15)
BUN: 14 mg/dL (ref 8–23)
CO2: 23 mmol/L (ref 22–32)
Calcium: 8.9 mg/dL (ref 8.9–10.3)
Chloride: 106 mmol/L (ref 98–111)
Creatinine: 0.87 mg/dL (ref 0.61–1.24)
GFR, Estimated: >60 mL/min (ref >60)
Glucose, Bld: 83 mg/dL (ref 70–99)
Potassium: 4.5 mmol/L (ref 3.5–5.1)
Sodium: 136 mmol/L (ref 135–145)
Total Bilirubin: 0.5 mg/dL (ref 0.3–1.2)
Total Protein: 7.8 g/dL (ref 6.5–8.1)

## 2020-08-17 LAB — CBC WITH DIFFERENTIAL (CANCER CENTER ONLY)
Abs Immature Granulocytes: 0.01 10*3/uL (ref 0.00–0.07)
Basophils Absolute: 0 10*3/uL (ref 0.0–0.1)
Basophils Relative: 0 %
Eosinophils Absolute: 0 10*3/uL (ref 0.0–0.5)
Eosinophils Relative: 0 %
HCT: 30.1 % — ABNORMAL LOW (ref 39.0–52.0)
Hemoglobin: 10.4 g/dL — ABNORMAL LOW (ref 13.0–17.0)
Immature Granulocytes: 1 %
Lymphocytes Relative: 44 %
Lymphs Abs: 0.8 10*3/uL (ref 0.7–4.0)
MCH: 32.6 pg (ref 26.0–34.0)
MCHC: 34.6 g/dL (ref 30.0–36.0)
MCV: 94.4 fL (ref 80.0–100.0)
Monocytes Absolute: 0.3 10*3/uL (ref 0.1–1.0)
Monocytes Relative: 15 %
Neutro Abs: 0.7 10*3/uL — ABNORMAL LOW (ref 1.7–7.7)
Neutrophils Relative %: 40 %
Platelet Count: 129 10*3/uL — ABNORMAL LOW (ref 150–400)
RBC: 3.19 MIL/uL — ABNORMAL LOW (ref 4.22–5.81)
RDW: 13.1 % (ref 11.5–15.5)
WBC Count: 1.8 10*3/uL — ABNORMAL LOW (ref 4.0–10.5)
nRBC: 0 % (ref 0.0–0.2)

## 2020-08-17 MED ORDER — ALTEPLASE 2 MG IJ SOLR
2.0000 mg | Freq: Once | INTRAMUSCULAR | Status: DC | PRN
  Filled 2020-08-17: qty 2

## 2020-08-17 MED ORDER — HEPARIN SOD (PORK) LOCK FLUSH 100 UNIT/ML IV SOLN
500.0000 [IU] | Freq: Once | INTRAVENOUS | Status: AC | PRN
Start: 2020-08-17 — End: 2020-08-17
  Administered 2020-08-17: 500 [IU]
  Filled 2020-08-17: qty 5

## 2020-08-17 MED ORDER — SODIUM CHLORIDE 0.9% FLUSH
10.0000 mL | INTRAVENOUS | Status: DC | PRN
  Administered 2020-08-17: 10 mL
  Filled 2020-08-17: qty 10

## 2020-08-17 NOTE — Patient Instructions (Signed)
Neutropenia Neutropenia is a condition that occurs when you have a lower-than-normal level of a type of white blood cell (neutrophil) in your body. Neutrophils are made in the spongy center of large bones (bone marrow), and they fight infections. Neutrophils are your body's main defense against bacterial and fungal infections. The fewer neutrophils you have and the longer your body remains without them, the greater your risk of getting a severe infection. What are the causes? This condition can occur if your body uses up or destroys neutrophils faster than your bone marrow can make them. Neutropenia may be caused by:  A bacterial or fungal infection.  Allergic disorders.  Reactions to some medicines.  An autoimmune disease.  An enlarged spleen. This condition can also occur if your bone marrow does not produce enough neutrophils. This problem may be caused by:  Cancer.  Cancer treatments, such as radiation or chemotherapy.  Viral infections.  Medicines, such as phenytoin.  Vitamin B12 deficiency.  Diseases of the bone marrow.  Environmental toxins, such as insecticides. What are the signs or symptoms? This condition does not usually cause symptoms. If symptoms are present, they are usually caused by an underlying infection. Symptoms of an infection may include:  Fever.  Chills.  Swollen glands.  Oral or anal ulcers.  Cough and shortness of breath.  Rash.  Skin infection.  Fatigue. How is this diagnosed? Your health care provider may suspect neutropenia if you have:  A condition that may cause neutropenia.  Symptoms during or after treatment for cancer.  Symptoms of infection, especially fever.  Frequent and unusual infections. This condition is diagnosed based on your medical history and a physical exam. Tests will also be done, such as:  A complete blood count (CBC).  A procedure to collect a sample of bone marrow for examination (bone marrow  biopsy).  A chest X-ray.  A urine culture.  A blood culture. How is this treated? Treatment depends on the underlying cause and severity of your condition. Mild neutropenia may not require treatment. Treatment may include medicines, such as:  Antibiotic medicine given through an IV.  Antiviral medicines.  Antifungal medicines.  A medicine to increase neutrophil production (colony-stimulating factor). You may get this drug through an IV or by injection.  Steroids given through an IV. If an underlying condition is causing neutropenia, you may need treatment for that condition. If medicines or cancer treatments are causing neutropenia, your health care provider may have you stop the medicines or treatment. Follow these instructions at home: Medicines  Take over-the-counter and prescription medicines only as told by your health care provider.  Get a seasonal flu shot (influenza vaccine).  Avoid people who received a vaccine in the past 30 days if that vaccine contained a live version of the germ (live vaccine). You should not get a live vaccine. Common live vaccines are polio, MMR, chicken pox, and shingles vaccines.   Eating and drinking  Do not share food utensils.  Do not eat unpasteurized foods.  Do not eat raw or undercooked meat, eggs, or seafood.  Do not eat unwashed, raw fruits or vegetables. Lifestyle  Avoid exposure to groups of people or children.  Avoid being around people who are sick.  Avoid being around dirt or dust, such as in construction areas or gardens.  Do not provide direct care for pets. Avoid animal droppings. Do not clean litter boxes and bird cages.  Do not have sex unless your health care provider has approved. Hygiene    Bathe daily.  Clean the area between the genitals and the anus (perineal area) after you urinate or have a bowel movement. If you are male, wipe from front to back.  Brush your teeth with a soft toothbrush before and after  meals.  Do not use a regular razor. Use an electric razor to remove hair.  Wash your hands often. Make sure others who come in contact with you also wash their hands. If soap and water are not available, use hand sanitizer.   General instructions  Follow any precautions as told by your health care provider to reduce your risk for injury or infection.  Take actions to avoid cuts and burns. For example: ? Be cautious when you use knives. Always cut away from yourself. ? Keep knives in protective sheaths or guards when not in use. ? Use oven mitts when you cook with a hot stove, oven, or grill. ? Stand a safe distance away from open fires.  Do not use tampons, enemas, or rectal suppositories unless your health care provider has approved.  Keep all follow-up visits as told by your health care provider. This is important. Contact a health care provider if:  You have: ? A sore throat. ? A warm, red, or tender area on your skin. ? A cough. ? Frequent or painful urination. ? Vaginal discharge or itching.  You develop: ? Sores in your mouth or anus. ? Swollen lymph nodes. ? Red streaks on the skin. ? A rash. Get help right away if:  You have: ? A fever. ? Chills, or you start to shake.  You feel: ? Nauseous, or you vomit. ? Very fatigued. ? Short of breath. Summary  Neutropenia is a condition that occurs when you have a lower-than-normal level of a type of white blood cell (neutrophil) in your body.  This condition can occur if your body uses up or destroys neutrophils faster than your bone marrow can make them.  Treatment depends on the underlying cause and severity of your condition. Mild neutropenia may not require treatment.  Follow any precautions as told by your health care provider to reduce your risk for injury or infection. This information is not intended to replace advice given to you by your health care provider. Make sure you discuss any questions you have with  your health care provider. Document Revised: 04/16/2018 Document Reviewed: 04/16/2018 Elsevier Patient Education  2021 Elsevier Inc.  

## 2020-08-17 NOTE — Progress Notes (Signed)
Patient's ANC 0.7. [name] made aware, per [name], hold treatment today. Patient made aware and verbalized understanding. Given education on neutropenic precautions.

## 2020-08-17 NOTE — Progress Notes (Signed)
CSW faxed LLS one-time $100 financial assistance application to LLS on patient's behalf.

## 2020-08-18 LAB — KAPPA/LAMBDA LIGHT CHAINS
Kappa free light chain: 18.7 mg/L (ref 3.3–19.4)
Kappa, lambda light chain ratio: 8.9 — ABNORMAL HIGH (ref 0.26–1.65)
Lambda free light chains: 2.1 mg/L — ABNORMAL LOW (ref 5.7–26.3)

## 2020-08-22 LAB — MULTIPLE MYELOMA PANEL, SERUM
Albumin SerPl Elph-Mcnc: 3.9 g/dL (ref 2.9–4.4)
Albumin/Glob SerPl: 1.2 (ref 0.7–1.7)
Alpha 1: 0.2 g/dL (ref 0.0–0.4)
Alpha2 Glob SerPl Elph-Mcnc: 0.9 g/dL (ref 0.4–1.0)
B-Globulin SerPl Elph-Mcnc: 0.7 g/dL (ref 0.7–1.3)
Gamma Glob SerPl Elph-Mcnc: 1.7 g/dL (ref 0.4–1.8)
Globulin, Total: 3.5 g/dL (ref 2.2–3.9)
IgA: 21 mg/dL — ABNORMAL LOW (ref 61–437)
IgG (Immunoglobin G), Serum: 1808 mg/dL — ABNORMAL HIGH (ref 603–1613)
IgM (Immunoglobulin M), Srm: 16 mg/dL — ABNORMAL LOW (ref 20–172)
M Protein SerPl Elph-Mcnc: 1.4 g/dL — ABNORMAL HIGH
Total Protein ELP: 7.4 g/dL (ref 6.0–8.5)

## 2020-08-23 ENCOUNTER — Other Ambulatory Visit (HOSPITAL_COMMUNITY): Payer: Self-pay | Admitting: Family Medicine

## 2020-08-24 ENCOUNTER — Other Ambulatory Visit: Payer: Self-pay

## 2020-08-24 ENCOUNTER — Inpatient Hospital Stay: Payer: Medicare Other

## 2020-08-24 ENCOUNTER — Inpatient Hospital Stay (HOSPITAL_BASED_OUTPATIENT_CLINIC_OR_DEPARTMENT_OTHER): Payer: Medicare Other | Admitting: Oncology

## 2020-08-24 VITALS — BP 166/85 | HR 70 | Temp 97.5°F | Resp 18 | Wt 159.2 lb

## 2020-08-24 DIAGNOSIS — C9001 Multiple myeloma in remission: Secondary | ICD-10-CM

## 2020-08-24 DIAGNOSIS — Z95828 Presence of other vascular implants and grafts: Secondary | ICD-10-CM

## 2020-08-24 DIAGNOSIS — Z7901 Long term (current) use of anticoagulants: Secondary | ICD-10-CM | POA: Diagnosis not present

## 2020-08-24 DIAGNOSIS — C61 Malignant neoplasm of prostate: Secondary | ICD-10-CM

## 2020-08-24 DIAGNOSIS — C9002 Multiple myeloma in relapse: Secondary | ICD-10-CM | POA: Diagnosis not present

## 2020-08-24 DIAGNOSIS — Z5112 Encounter for antineoplastic immunotherapy: Secondary | ICD-10-CM | POA: Diagnosis not present

## 2020-08-24 DIAGNOSIS — Z886 Allergy status to analgesic agent status: Secondary | ICD-10-CM | POA: Diagnosis not present

## 2020-08-24 DIAGNOSIS — Z79899 Other long term (current) drug therapy: Secondary | ICD-10-CM | POA: Diagnosis not present

## 2020-08-24 DIAGNOSIS — D649 Anemia, unspecified: Secondary | ICD-10-CM | POA: Diagnosis not present

## 2020-08-24 LAB — CBC WITH DIFFERENTIAL (CANCER CENTER ONLY)
Abs Immature Granulocytes: 0.01 10*3/uL (ref 0.00–0.07)
Basophils Absolute: 0 10*3/uL (ref 0.0–0.1)
Basophils Relative: 0 %
Eosinophils Absolute: 0 10*3/uL (ref 0.0–0.5)
Eosinophils Relative: 0 %
HCT: 29.9 % — ABNORMAL LOW (ref 39.0–52.0)
Hemoglobin: 10.3 g/dL — ABNORMAL LOW (ref 13.0–17.0)
Immature Granulocytes: 0 %
Lymphocytes Relative: 28 %
Lymphs Abs: 0.9 10*3/uL (ref 0.7–4.0)
MCH: 33.1 pg (ref 26.0–34.0)
MCHC: 34.4 g/dL (ref 30.0–36.0)
MCV: 96.1 fL (ref 80.0–100.0)
Monocytes Absolute: 0.4 10*3/uL (ref 0.1–1.0)
Monocytes Relative: 11 %
Neutro Abs: 2 10*3/uL (ref 1.7–7.7)
Neutrophils Relative %: 61 %
Platelet Count: 286 10*3/uL (ref 150–400)
RBC: 3.11 MIL/uL — ABNORMAL LOW (ref 4.22–5.81)
RDW: 13.2 % (ref 11.5–15.5)
WBC Count: 3.3 10*3/uL — ABNORMAL LOW (ref 4.0–10.5)
nRBC: 0 % (ref 0.0–0.2)

## 2020-08-24 LAB — CMP (CANCER CENTER ONLY)
ALT: 18 U/L (ref 0–44)
AST: 17 U/L (ref 15–41)
Albumin: 3.8 g/dL (ref 3.5–5.0)
Alkaline Phosphatase: 78 U/L (ref 38–126)
Anion gap: 6 (ref 5–15)
BUN: 14 mg/dL (ref 8–23)
CO2: 25 mmol/L (ref 22–32)
Calcium: 8.9 mg/dL (ref 8.9–10.3)
Chloride: 106 mmol/L (ref 98–111)
Creatinine: 0.87 mg/dL (ref 0.61–1.24)
GFR, Estimated: >60 mL/min (ref >60)
Glucose, Bld: 98 mg/dL (ref 70–99)
Potassium: 4.2 mmol/L (ref 3.5–5.1)
Sodium: 137 mmol/L (ref 135–145)
Total Bilirubin: 0.5 mg/dL (ref 0.3–1.2)
Total Protein: 7.6 g/dL (ref 6.5–8.1)

## 2020-08-24 MED ORDER — SODIUM CHLORIDE 0.9 % IV SOLN
Freq: Once | INTRAVENOUS | Status: AC
  Filled 2020-08-24: qty 250

## 2020-08-24 MED ORDER — DEXTROSE 5 % IV SOLN
56.0000 mg/m2 | Freq: Once | INTRAVENOUS | Status: AC
  Administered 2020-08-24: 110 mg via INTRAVENOUS
  Filled 2020-08-24: qty 15

## 2020-08-24 MED ORDER — ACETAMINOPHEN 325 MG PO TABS
ORAL_TABLET | ORAL | Status: AC
  Filled 2020-08-24: qty 2

## 2020-08-24 MED ORDER — ACETAMINOPHEN 325 MG PO TABS
650.0000 mg | ORAL_TABLET | Freq: Once | ORAL | Status: AC
  Administered 2020-08-24: 650 mg via ORAL

## 2020-08-24 MED ORDER — HEPARIN SOD (PORK) LOCK FLUSH 100 UNIT/ML IV SOLN
500.0000 [IU] | Freq: Once | INTRAVENOUS | Status: AC | PRN
  Administered 2020-08-24: 500 [IU]
  Filled 2020-08-24: qty 5

## 2020-08-24 MED ORDER — DIPHENHYDRAMINE HCL 25 MG PO CAPS
ORAL_CAPSULE | ORAL | Status: AC
  Filled 2020-08-24: qty 2

## 2020-08-24 MED ORDER — SODIUM CHLORIDE 0.9% FLUSH
10.0000 mL | INTRAVENOUS | Status: DC | PRN
  Administered 2020-08-24: 10 mL
  Filled 2020-08-24: qty 10

## 2020-08-24 MED ORDER — DIPHENHYDRAMINE HCL 25 MG PO CAPS
50.0000 mg | ORAL_CAPSULE | Freq: Once | ORAL | Status: AC
  Administered 2020-08-24: 50 mg via ORAL

## 2020-08-24 MED ORDER — SODIUM CHLORIDE 0.9 % IV SOLN
40.0000 mg | Freq: Once | INTRAVENOUS | Status: AC
  Administered 2020-08-24: 40 mg via INTRAVENOUS
  Filled 2020-08-24: qty 4

## 2020-08-24 MED ORDER — DARATUMUMAB-HYALURONIDASE-FIHJ 1800-30000 MG-UT/15ML ~~LOC~~ SOLN
1800.0000 mg | Freq: Once | SUBCUTANEOUS | Status: AC
  Administered 2020-08-24: 1800 mg via SUBCUTANEOUS
  Filled 2020-08-24: qty 15

## 2020-08-24 MED FILL — OXYCODONE-APAP 10-325: 10-325 | 30 days supply | Qty: 60 | Fill #0

## 2020-08-24 NOTE — Patient Instructions (Signed)
[facility] Discharge Instructions for Patients Receiving Chemotherapy  Today you received the following chemotherapy agents: Carfilzomib (Kyprolis) and Daratumumab (Darzalex Faspro) To help prevent nausea and vomiting after your treatment, we encourage you to take your nausea medication  as prescribed.    If you develop nausea and vomiting that is not controlled by your nausea medication, call the clinic.   BELOW ARE SYMPTOMS THAT SHOULD BE REPORTED IMMEDIATELY:  *FEVER GREATER THAN 100.5 F  *CHILLS WITH OR WITHOUT FEVER  NAUSEA AND VOMITING THAT IS NOT CONTROLLED WITH YOUR NAUSEA MEDICATION  *UNUSUAL SHORTNESS OF BREATH  *UNUSUAL BRUISING OR BLEEDING  TENDERNESS IN MOUTH AND THROAT WITH OR WITHOUT PRESENCE OF ULCERS  *URINARY PROBLEMS  *BOWEL PROBLEMS  UNUSUAL RASH Items with * indicate a potential emergency and should be followed up as soon as possible.  Feel free to call the clinic should you have any questions or concerns. The clinic phone number is [phone].  Please show the [facility] at check-in to the Emergency Department and triage nurse.

## 2020-08-24 NOTE — Progress Notes (Signed)
Hematology and Oncology Follow Up Visit  Travis Nguyen [id number] 03-10-1951 70 y.o. 08/24/2020 8:03 AM Travis Nguyen, Travis Brow, MD   Principle Diagnosis: 70 year old man with relapsed multiple myeloma noted in December 2021.  He was found to have IgG kappa diagnosed in 2014.  He presented with worsening anemia and 28% plasma cell infiltration upon relapse.  Secondary diagnosis: Stage a T1c, Gleason score 3+4 = 7 prostate cancer that is currently in remission.  Prior Therapy: He was treated initially with Cytoxan, Velcade and Decadron and subsequently his regimen changed to a Velcade, Revlimid with dexamethasone.  He achieved remission at that time.   He is status post a robotic-assisted laparoscopic radical prostatectomy and bilateral lymph node dissection on 02/09/2014. The final pathology showed prostate adenocarcinoma Gleason score 4+3 equals 7 involving both lobes.    Current therapy: Salvage therapy carfilzomib, daratumumab and dexamethasone started on July 28, 2020.  He is here for the next cycle of therapy.  Interim History:  Mr. Travis Nguyen returns today for repeat evaluation.  Since the last visit, he reports no major changes in his health.  He has tolerated chemotherapy without any complaints.  He denies any nausea, vomiting or abdominal pain.  He denies any worsening neuropathy or fatigue.  He denies any recent hospitalization or illnesses.  He is eating well and his performance status remains excellent.     Medications: Updated on review. Current Outpatient Medications  Medication Sig Dispense Refill  . HYDROcodone-acetaminophen (NORCO/VICODIN) 5-325 MG tablet Take 1 tablet by mouth every 6 (six) hours as needed for moderate pain. (Patient not taking: Reported on 11/12/2017) 60 tablet 0  . lidocaine-prilocaine (EMLA) cream APPLY TOPICALLY TO PORT-A-CATH DAILY AS NEEDED 30 g 2  . losartan (COZAAR) 100 MG tablet Take 100 mg by mouth every morning.     . methocarbamol  (ROBAXIN) 500 MG tablet Take 1 tablet (500 mg total) by mouth 2 (two) times daily. 20 tablet 0  . oxyCODONE-acetaminophen (PERCOCET) 10-325 MG tablet Take 1 tablet by mouth as needed.  0  . rivaroxaban (XARELTO) 20 MG TABS tablet Take 1 tablet (20 mg total) by mouth daily with supper. (Patient not taking: Reported on 11/12/2017) 30 tablet 0   No current facility-administered medications for this visit.     Allergies:  Allergies  Allergen Reactions  . Aspirin     Pt stated "raises BP"        Physical Exam: Blood pressure (!) 166/85, pulse 70, temperature (!) 97.5 F (36.4 C), temperature source Tympanic, resp. rate 18, weight 159 lb 3.2 oz (72.2 kg), SpO2 100 %.    ECOG: 1   General appearance: Alert, awake without any distress. Head: Atraumatic without abnormalities Oropharynx: Without any thrush or ulcers. Eyes: No scleral icterus. Lymph nodes: No lymphadenopathy noted in the cervical, supraclavicular, or axillary nodes Heart:regular rate and rhythm, without any murmurs or gallops.   Lung: Clear to auscultation without any rhonchi, wheezes or dullness to percussion. Abdomin: Soft, nontender without any shifting dullness or ascites. Musculoskeletal: No clubbing or cyanosis. Neurological: No motor or sensory deficits. Skin: No rashes or lesions. Psychiatric: Mood and affect appeared normal.        Lab Results: Lab Results  Component Value Date   WBC 1.8 (L) 08/17/2020   HGB 10.4 (L) 08/17/2020   HCT 30.1 (L) 08/17/2020   MCV 94.4 08/17/2020   PLT 129 (L) 08/17/2020     Chemistry      Component Value Date/Time   NA  136 08/17/2020 1321   NA 138 03/27/2017 0838   K 4.5 08/17/2020 1321   K 4.3 03/27/2017 0838   CL 106 08/17/2020 1321   CL 105 01/01/2013 0835   CO2 23 08/17/2020 1321   CO2 24 03/27/2017 0838   BUN 14 08/17/2020 1321   BUN 23.7 03/27/2017 0838   CREATININE 0.87 08/17/2020 1321   CREATININE 1.2 03/27/2017 0838      Component Value  Date/Time   CALCIUM 8.9 08/17/2020 1321   CALCIUM 9.9 03/27/2017 0838   ALKPHOS 66 08/17/2020 1321   ALKPHOS 79 03/27/2017 0838   AST 20 08/17/2020 1321   AST 25 03/27/2017 0838   ALT 38 08/17/2020 1321   ALT 26 03/27/2017 0838   BILITOT 0.5 08/17/2020 1321   BILITOT 0.31 03/27/2017 0838      Results for Travis Nguyen, Travis Nguyen (MRN [id number]) as of 08/24/2020 08:04  Ref. Range 06/13/2020 08:04 08/17/2020 13:21  M Protein SerPl Elph-Mcnc Latest Ref Range: Not Observed g/dL 1.8 (H) 1.4 (H)  IFE 1 Unknown Comment (A) Comment (A)  Globulin, Total Latest Ref Range: 2.2 - 3.9 g/dL 4.0 (H) 3.5  B-Globulin SerPl Elph-Mcnc Latest Ref Range: 0.7 - 1.3 g/dL 0.9 0.7  IgG (Immunoglobin G), Serum Latest Ref Range: 603 - 1,613 mg/dL 2,418 (H) 1,808 (H)  IgM (Immunoglobulin M), Srm Latest Ref Range: 20 - 172 mg/dL 19 (L) 16 (L)  IgA Latest Ref Range: 61 - 437 mg/dL 52 (L) 21 (L)     Results for Travis Nguyen, Travis Nguyen (MRN [id number]) as of 08/24/2020 08:04  Ref. Range 06/13/2020 08:04 08/17/2020 13:21  Kappa free light chain Latest Ref Range: 3.3 - 19.4 mg/L 39.4 (H) 18.7  Lamda free light chains Latest Ref Range: 5.7 - 26.3 mg/L 4.3 (L) 2.1 (L)  Kappa, lamda light chain ratio Latest Ref Range: 0.26 - 1.65  9.16 (H) 8.90 (H)      Impression and Plan:  70 year old man with:   1.  Relapsed IgG kappa multiple myeloma noted in December 2021 with increased plasma cell infiltration in the bone marrow.  He was initially diagnosed in 2014.   He is currently receiving salvage therapy with carfilzomib, daratumumab and dexamethasone weekly.  Week 4 of the first cycle was withheld because of neutropenia.  Risks and benefits of continuing this treatment were discussed at this time and treatment options were reviewed.  Modification of dosing schedule were reviewed to combat neutropenia were reviewed today.  He is agreeable to proceed and will make dose adjustment accordingly.  We will continue weekly treatment with a  dose reduction of his carfilzomib.  His absolute neutrophil count today is adequate.  Protein studies on February 3 showed improvement in his IgG level and decline in his M spike.   2.  Prostate cancer: He is in remission with PSA continues to be undetectable without evidence of relapse.   3.  IV access: Port-A-Cath remains in use without any issues.  4.  Antiemetics: No nausea or vomiting reported at this time.   5. Followup: Weekly therapy and MD follow-up in 4 weeks.  30  minutes were spent on this visit.  The time was dedicated to reviewing his disease status, laboratory data discussion and addressing complications related to his cancer and cancer therapy.    Zola Button, MD 2/10/20228:03 AM

## 2020-08-31 ENCOUNTER — Inpatient Hospital Stay: Payer: Medicare Other

## 2020-08-31 ENCOUNTER — Other Ambulatory Visit: Payer: Medicare Other

## 2020-08-31 ENCOUNTER — Other Ambulatory Visit: Payer: Self-pay

## 2020-08-31 ENCOUNTER — Ambulatory Visit: Payer: Medicare Other

## 2020-08-31 VITALS — BP 177/93 | HR 60 | Temp 98.2°F | Resp 20 | Wt 162.8 lb

## 2020-08-31 DIAGNOSIS — C9001 Multiple myeloma in remission: Secondary | ICD-10-CM

## 2020-08-31 DIAGNOSIS — Z886 Allergy status to analgesic agent status: Secondary | ICD-10-CM | POA: Diagnosis not present

## 2020-08-31 DIAGNOSIS — Z7901 Long term (current) use of anticoagulants: Secondary | ICD-10-CM | POA: Diagnosis not present

## 2020-08-31 DIAGNOSIS — Z79899 Other long term (current) drug therapy: Secondary | ICD-10-CM | POA: Diagnosis not present

## 2020-08-31 DIAGNOSIS — D649 Anemia, unspecified: Secondary | ICD-10-CM | POA: Diagnosis not present

## 2020-08-31 DIAGNOSIS — Z5112 Encounter for antineoplastic immunotherapy: Secondary | ICD-10-CM | POA: Diagnosis not present

## 2020-08-31 DIAGNOSIS — C9002 Multiple myeloma in relapse: Secondary | ICD-10-CM | POA: Diagnosis not present

## 2020-08-31 DIAGNOSIS — C61 Malignant neoplasm of prostate: Secondary | ICD-10-CM

## 2020-08-31 DIAGNOSIS — Z95828 Presence of other vascular implants and grafts: Secondary | ICD-10-CM

## 2020-08-31 LAB — CBC WITH DIFFERENTIAL (CANCER CENTER ONLY)
Abs Immature Granulocytes: 0.01 10*3/uL (ref 0.00–0.07)
Basophils Absolute: 0 10*3/uL (ref 0.0–0.1)
Basophils Relative: 0 %
Eosinophils Absolute: 0 10*3/uL (ref 0.0–0.5)
Eosinophils Relative: 0 %
HCT: 28 % — ABNORMAL LOW (ref 39.0–52.0)
Hemoglobin: 9.7 g/dL — ABNORMAL LOW (ref 13.0–17.0)
Immature Granulocytes: 0 %
Lymphocytes Relative: 35 %
Lymphs Abs: 0.9 10*3/uL (ref 0.7–4.0)
MCH: 33.4 pg (ref 26.0–34.0)
MCHC: 34.6 g/dL (ref 30.0–36.0)
MCV: 96.6 fL (ref 80.0–100.0)
Monocytes Absolute: 0.4 10*3/uL (ref 0.1–1.0)
Monocytes Relative: 16 %
Neutro Abs: 1.2 10*3/uL — ABNORMAL LOW (ref 1.7–7.7)
Neutrophils Relative %: 49 %
Platelet Count: 111 10*3/uL — ABNORMAL LOW (ref 150–400)
RBC: 2.9 MIL/uL — ABNORMAL LOW (ref 4.22–5.81)
RDW: 13.8 % (ref 11.5–15.5)
WBC Count: 2.4 10*3/uL — ABNORMAL LOW (ref 4.0–10.5)
nRBC: 0 % (ref 0.0–0.2)

## 2020-08-31 LAB — CMP (CANCER CENTER ONLY)
ALT: 24 U/L (ref 0–44)
AST: 20 U/L (ref 15–41)
Albumin: 3.6 g/dL (ref 3.5–5.0)
Alkaline Phosphatase: 79 U/L (ref 38–126)
Anion gap: 6 (ref 5–15)
BUN: 11 mg/dL (ref 8–23)
CO2: 23 mmol/L (ref 22–32)
Calcium: 8.2 mg/dL — ABNORMAL LOW (ref 8.9–10.3)
Chloride: 107 mmol/L (ref 98–111)
Creatinine: 0.92 mg/dL (ref 0.61–1.24)
GFR, Estimated: >60 mL/min (ref >60)
Glucose, Bld: 119 mg/dL — ABNORMAL HIGH (ref 70–99)
Potassium: 4.2 mmol/L (ref 3.5–5.1)
Sodium: 136 mmol/L (ref 135–145)
Total Bilirubin: 0.4 mg/dL (ref 0.3–1.2)
Total Protein: 7.1 g/dL (ref 6.5–8.1)

## 2020-08-31 MED ORDER — DARATUMUMAB-HYALURONIDASE-FIHJ 1800-30000 MG-UT/15ML ~~LOC~~ SOLN
1800.0000 mg | Freq: Once | SUBCUTANEOUS | Status: AC
  Administered 2020-08-31: 1800 mg via SUBCUTANEOUS
  Filled 2020-08-31: qty 15

## 2020-08-31 MED ORDER — SODIUM CHLORIDE 0.9 % IV SOLN
Freq: Once | INTRAVENOUS | Status: AC
  Filled 2020-08-31: qty 250

## 2020-08-31 MED ORDER — ACETAMINOPHEN 325 MG PO TABS
ORAL_TABLET | ORAL | Status: AC
  Filled 2020-08-31: qty 2

## 2020-08-31 MED ORDER — DEXTROSE 5 % IV SOLN
56.0000 mg/m2 | Freq: Once | INTRAVENOUS | Status: AC
  Administered 2020-08-31: 110 mg via INTRAVENOUS
  Filled 2020-08-31: qty 30

## 2020-08-31 MED ORDER — HEPARIN SOD (PORK) LOCK FLUSH 100 UNIT/ML IV SOLN
500.0000 [IU] | Freq: Once | INTRAVENOUS | Status: AC | PRN
  Administered 2020-08-31: 500 [IU]
  Filled 2020-08-31: qty 5

## 2020-08-31 MED ORDER — ACETAMINOPHEN 325 MG PO TABS
650.0000 mg | ORAL_TABLET | Freq: Once | ORAL | Status: AC
  Administered 2020-08-31: 650 mg via ORAL

## 2020-08-31 MED ORDER — SODIUM CHLORIDE 0.9 % IV SOLN
40.0000 mg | Freq: Once | INTRAVENOUS | Status: AC
  Administered 2020-08-31: 40 mg via INTRAVENOUS
  Filled 2020-08-31: qty 4

## 2020-08-31 MED ORDER — DIPHENHYDRAMINE HCL 25 MG PO CAPS
50.0000 mg | ORAL_CAPSULE | Freq: Once | ORAL | Status: AC
  Administered 2020-08-31: 50 mg via ORAL

## 2020-08-31 MED ORDER — SODIUM CHLORIDE 0.9% FLUSH
10.0000 mL | INTRAVENOUS | Status: DC | PRN
  Administered 2020-08-31: 10 mL
  Filled 2020-08-31: qty 10

## 2020-08-31 MED ORDER — DIPHENHYDRAMINE HCL 25 MG PO CAPS
ORAL_CAPSULE | ORAL | Status: AC
  Filled 2020-08-31: qty 2

## 2020-08-31 NOTE — Patient Instructions (Signed)
[facility] Discharge Instructions for Patients Receiving Chemotherapy  Today you received the following chemotherapy agents Carfilzomib (KYPROLIS) & Daratumumab-hyaluronidase-fihj (DARZALEX FASPRO).  To help prevent nausea and vomiting after your treatment, we encourage you to take your nausea medication as prescribed.   If you develop nausea and vomiting that is not controlled by your nausea medication, call the clinic.   BELOW ARE SYMPTOMS THAT SHOULD BE REPORTED IMMEDIATELY:  *FEVER GREATER THAN 100.5 F  *CHILLS WITH OR WITHOUT FEVER  NAUSEA AND VOMITING THAT IS NOT CONTROLLED WITH YOUR NAUSEA MEDICATION  *UNUSUAL SHORTNESS OF BREATH  *UNUSUAL BRUISING OR BLEEDING  TENDERNESS IN MOUTH AND THROAT WITH OR WITHOUT PRESENCE OF ULCERS  *URINARY PROBLEMS  *BOWEL PROBLEMS  UNUSUAL RASH Items with * indicate a potential emergency and should be followed up as soon as possible.  Feel free to call the clinic should you have any questions or concerns. The clinic phone number is [phone].  Please show the [place] at check-in to the Emergency Department and triage nurse.

## 2020-08-31 NOTE — Progress Notes (Signed)
Per Dr. Irene Limbo: okay to treat with ANC of 1.2

## 2020-08-31 NOTE — Patient Instructions (Signed)
Implanted Port Insertion, Care After This sheet gives you information about how to care for yourself after your procedure. Your health care provider may also give you more specific instructions. If you have problems or questions, contact your health care provider. What can I expect after the procedure? After the procedure, it is common to have:  Discomfort at the port insertion site.  Bruising on the skin over the port. This should improve over 3-4 days. Follow these instructions at home: Port care  After your port is placed, you will get a manufacturer's information card. The card has information about your port. Keep this card with you at all times.  Take care of the port as told by your health care provider. Ask your health care provider if you or a family member can get training for taking care of the port at home. A home health care nurse may also take care of the port.  Make sure to remember what type of port you have. Incision care  Follow instructions from your health care provider about how to take care of your port insertion site. Make sure you: ? Wash your hands with soap and water before and after you change your bandage (dressing). If soap and water are not available, use hand sanitizer. ? Change your dressing as told by your health care provider. ? Leave stitches (sutures), skin glue, or adhesive strips in place. These skin closures may need to stay in place for 2 weeks or longer. If adhesive strip edges start to loosen and curl up, you may trim the loose edges. Do not remove adhesive strips completely unless your health care provider tells you to do that.  Check your port insertion site every day for signs of infection. Check for: ? Redness, swelling, or pain. ? Fluid or blood. ? Warmth. ? Pus or a bad smell.      Activity  Return to your normal activities as told by your health care provider. Ask your health care provider what activities are safe for you.  Do not  lift anything that is heavier than 10 lb (4.5 kg), or the limit that you are told, until your health care provider says that it is safe. General instructions  Take over-the-counter and prescription medicines only as told by your health care provider.  Do not take baths, swim, or use a hot tub until your health care provider approves. Ask your health care provider if you may take showers. You may only be allowed to take sponge baths.  Do not drive for 24 hours if you were given a sedative during your procedure.  Wear a medical alert bracelet in case of an emergency. This will tell any health care providers that you have a port.  Keep all follow-up visits as told by your health care provider. This is important. Contact a health care provider if:  You cannot flush your port with saline as directed, or you cannot draw blood from the port.  You have a fever or chills.  You have redness, swelling, or pain around your port insertion site.  You have fluid or blood coming from your port insertion site.  Your port insertion site feels warm to the touch.  You have pus or a bad smell coming from the port insertion site. Get help right away if:  You have chest pain or shortness of breath.  You have bleeding from your port that you cannot control. Summary  Take care of the port as told by your   health care provider. Keep the manufacturer's information card with you at all times.  Change your dressing as told by your health care provider.  Contact a health care provider if you have a fever or chills or if you have redness, swelling, or pain around your port insertion site.  Keep all follow-up visits as told by your health care provider. This information is not intended to replace advice given to you by your health care provider. Make sure you discuss any questions you have with your health care provider. Document Revised: 01/27/2018 Document Reviewed: 01/27/2018 Elsevier Patient Education   2021 Elsevier Inc.  

## 2020-09-01 ENCOUNTER — Other Ambulatory Visit: Payer: Self-pay | Admitting: Hematology

## 2020-09-07 ENCOUNTER — Other Ambulatory Visit: Payer: Medicare Other

## 2020-09-07 ENCOUNTER — Inpatient Hospital Stay: Payer: Medicare Other

## 2020-09-07 ENCOUNTER — Ambulatory Visit: Payer: Medicare Other

## 2020-09-07 ENCOUNTER — Other Ambulatory Visit: Payer: Self-pay

## 2020-09-07 VITALS — BP 179/95 | HR 60 | Temp 98.4°F | Resp 20 | Wt 162.5 lb

## 2020-09-07 DIAGNOSIS — Z7901 Long term (current) use of anticoagulants: Secondary | ICD-10-CM | POA: Diagnosis not present

## 2020-09-07 DIAGNOSIS — C9001 Multiple myeloma in remission: Secondary | ICD-10-CM

## 2020-09-07 DIAGNOSIS — D649 Anemia, unspecified: Secondary | ICD-10-CM | POA: Diagnosis not present

## 2020-09-07 DIAGNOSIS — Z5112 Encounter for antineoplastic immunotherapy: Secondary | ICD-10-CM | POA: Diagnosis not present

## 2020-09-07 DIAGNOSIS — C9002 Multiple myeloma in relapse: Secondary | ICD-10-CM | POA: Diagnosis not present

## 2020-09-07 DIAGNOSIS — Z79899 Other long term (current) drug therapy: Secondary | ICD-10-CM | POA: Diagnosis not present

## 2020-09-07 DIAGNOSIS — Z886 Allergy status to analgesic agent status: Secondary | ICD-10-CM | POA: Diagnosis not present

## 2020-09-07 LAB — CBC WITH DIFFERENTIAL (CANCER CENTER ONLY)
Abs Immature Granulocytes: 0.01 10*3/uL (ref 0.00–0.07)
Basophils Absolute: 0 10*3/uL (ref 0.0–0.1)
Basophils Relative: 0 %
Eosinophils Absolute: 0 10*3/uL (ref 0.0–0.5)
Eosinophils Relative: 1 %
HCT: 29.2 % — ABNORMAL LOW (ref 39.0–52.0)
Hemoglobin: 9.7 g/dL — ABNORMAL LOW (ref 13.0–17.0)
Immature Granulocytes: 0 %
Lymphocytes Relative: 20 %
Lymphs Abs: 0.9 10*3/uL (ref 0.7–4.0)
MCH: 32.8 pg (ref 26.0–34.0)
MCHC: 33.2 g/dL (ref 30.0–36.0)
MCV: 98.6 fL (ref 80.0–100.0)
Monocytes Absolute: 0.5 10*3/uL (ref 0.1–1.0)
Monocytes Relative: 13 %
Neutro Abs: 2.7 10*3/uL (ref 1.7–7.7)
Neutrophils Relative %: 66 %
Platelet Count: 128 10*3/uL — ABNORMAL LOW (ref 150–400)
RBC: 2.96 MIL/uL — ABNORMAL LOW (ref 4.22–5.81)
RDW: 14.4 % (ref 11.5–15.5)
WBC Count: 4.2 10*3/uL (ref 4.0–10.5)
nRBC: 0 % (ref 0.0–0.2)

## 2020-09-07 LAB — CMP (CANCER CENTER ONLY)
ALT: 24 U/L (ref 0–44)
AST: 17 U/L (ref 15–41)
Albumin: 3.7 g/dL (ref 3.5–5.0)
Alkaline Phosphatase: 80 U/L (ref 38–126)
Anion gap: 7 (ref 5–15)
BUN: 12 mg/dL (ref 8–23)
CO2: 22 mmol/L (ref 22–32)
Calcium: 8.5 mg/dL — ABNORMAL LOW (ref 8.9–10.3)
Chloride: 106 mmol/L (ref 98–111)
Creatinine: 0.81 mg/dL (ref 0.61–1.24)
GFR, Estimated: >60 mL/min (ref >60)
Glucose, Bld: 111 mg/dL — ABNORMAL HIGH (ref 70–99)
Potassium: 4.7 mmol/L (ref 3.5–5.1)
Sodium: 135 mmol/L (ref 135–145)
Total Bilirubin: 0.4 mg/dL (ref 0.3–1.2)
Total Protein: 7.1 g/dL (ref 6.5–8.1)

## 2020-09-07 MED ORDER — ACETAMINOPHEN 325 MG PO TABS
ORAL_TABLET | ORAL | Status: AC
  Filled 2020-09-07: qty 2

## 2020-09-07 MED ORDER — DARATUMUMAB-HYALURONIDASE-FIHJ 1800-30000 MG-UT/15ML ~~LOC~~ SOLN
1800.0000 mg | Freq: Once | SUBCUTANEOUS | Status: AC
  Administered 2020-09-07: 1800 mg via SUBCUTANEOUS
  Filled 2020-09-07: qty 15

## 2020-09-07 MED ORDER — DEXTROSE 5 % IV SOLN
56.0000 mg/m2 | Freq: Once | INTRAVENOUS | Status: AC
  Administered 2020-09-07: 110 mg via INTRAVENOUS
  Filled 2020-09-07: qty 30

## 2020-09-07 MED ORDER — SODIUM CHLORIDE 0.9 % IV SOLN
40.0000 mg | Freq: Once | INTRAVENOUS | Status: AC
  Administered 2020-09-07: 40 mg via INTRAVENOUS
  Filled 2020-09-07: qty 4

## 2020-09-07 MED ORDER — SODIUM CHLORIDE 0.9 % IV SOLN
Freq: Once | INTRAVENOUS | Status: AC
  Filled 2020-09-07: qty 250

## 2020-09-07 MED ORDER — SODIUM CHLORIDE 0.9% FLUSH
10.0000 mL | INTRAVENOUS | Status: DC | PRN
  Administered 2020-09-07: 10 mL
  Filled 2020-09-07: qty 10

## 2020-09-07 MED ORDER — DIPHENHYDRAMINE HCL 25 MG PO CAPS
50.0000 mg | ORAL_CAPSULE | Freq: Once | ORAL | Status: AC
  Administered 2020-09-07: 50 mg via ORAL

## 2020-09-07 MED ORDER — DIPHENHYDRAMINE HCL 25 MG PO CAPS
ORAL_CAPSULE | ORAL | Status: AC
  Filled 2020-09-07: qty 2

## 2020-09-07 MED ORDER — ACETAMINOPHEN 325 MG PO TABS
650.0000 mg | ORAL_TABLET | Freq: Once | ORAL | Status: AC
  Administered 2020-09-07: 650 mg via ORAL

## 2020-09-07 MED ORDER — HEPARIN SOD (PORK) LOCK FLUSH 100 UNIT/ML IV SOLN
500.0000 [IU] | Freq: Once | INTRAVENOUS | Status: AC | PRN
  Administered 2020-09-07: 500 [IU]
  Filled 2020-09-07: qty 5

## 2020-09-07 NOTE — Patient Instructions (Signed)
[facility] Discharge Instructions for Patients Receiving Chemotherapy  Today you received the following chemotherapy agents: Carfilzomib (Kyprolis) and Darzalex Faspro  To help prevent nausea and vomiting after your treatment, we encourage you to take your nausea medication as directed by your MD.   If you develop nausea and vomiting that is not controlled by your nausea medication, call the clinic.   BELOW ARE SYMPTOMS THAT SHOULD BE REPORTED IMMEDIATELY:  *FEVER GREATER THAN 100.5 F  *CHILLS WITH OR WITHOUT FEVER  NAUSEA AND VOMITING THAT IS NOT CONTROLLED WITH YOUR NAUSEA MEDICATION  *UNUSUAL SHORTNESS OF BREATH  *UNUSUAL BRUISING OR BLEEDING  TENDERNESS IN MOUTH AND THROAT WITH OR WITHOUT PRESENCE OF ULCERS  *URINARY PROBLEMS  *BOWEL PROBLEMS  UNUSUAL RASH Items with * indicate a potential emergency and should be followed up as soon as possible.  Feel free to call the clinic should you have any questions or concerns. The clinic phone number is [phone].  Please show the [place] at check-in to the Emergency Department and triage nurse.

## 2020-09-14 ENCOUNTER — Inpatient Hospital Stay: Payer: Medicare Other

## 2020-09-14 ENCOUNTER — Inpatient Hospital Stay: Payer: Medicare Other | Attending: Oncology

## 2020-09-14 ENCOUNTER — Other Ambulatory Visit: Payer: Self-pay

## 2020-09-14 VITALS — BP 166/88 | HR 62 | Temp 98.2°F | Resp 18

## 2020-09-14 DIAGNOSIS — Z79899 Other long term (current) drug therapy: Secondary | ICD-10-CM | POA: Diagnosis not present

## 2020-09-14 DIAGNOSIS — C61 Malignant neoplasm of prostate: Secondary | ICD-10-CM

## 2020-09-14 DIAGNOSIS — C9001 Multiple myeloma in remission: Secondary | ICD-10-CM

## 2020-09-14 DIAGNOSIS — Z8546 Personal history of malignant neoplasm of prostate: Secondary | ICD-10-CM | POA: Insufficient documentation

## 2020-09-14 DIAGNOSIS — Z5112 Encounter for antineoplastic immunotherapy: Secondary | ICD-10-CM | POA: Insufficient documentation

## 2020-09-14 DIAGNOSIS — C9002 Multiple myeloma in relapse: Secondary | ICD-10-CM | POA: Insufficient documentation

## 2020-09-14 DIAGNOSIS — Z95828 Presence of other vascular implants and grafts: Secondary | ICD-10-CM

## 2020-09-14 LAB — CMP (CANCER CENTER ONLY)
ALT: 25 U/L (ref 0–44)
AST: 19 U/L (ref 15–41)
Albumin: 3.9 g/dL (ref 3.5–5.0)
Alkaline Phosphatase: 81 U/L (ref 38–126)
Anion gap: 6 (ref 5–15)
BUN: 14 mg/dL (ref 8–23)
CO2: 23 mmol/L (ref 22–32)
Calcium: 8.8 mg/dL — ABNORMAL LOW (ref 8.9–10.3)
Chloride: 104 mmol/L (ref 98–111)
Creatinine: 0.82 mg/dL (ref 0.61–1.24)
GFR, Estimated: >60 mL/min (ref >60)
Glucose, Bld: 111 mg/dL — ABNORMAL HIGH (ref 70–99)
Potassium: 4.4 mmol/L (ref 3.5–5.1)
Sodium: 133 mmol/L — ABNORMAL LOW (ref 135–145)
Total Bilirubin: 0.4 mg/dL (ref 0.3–1.2)
Total Protein: 7.4 g/dL (ref 6.5–8.1)

## 2020-09-14 LAB — CBC WITH DIFFERENTIAL (CANCER CENTER ONLY)
Abs Immature Granulocytes: 0.01 10*3/uL (ref 0.00–0.07)
Basophils Absolute: 0 10*3/uL (ref 0.0–0.1)
Basophils Relative: 0 %
Eosinophils Absolute: 0 10*3/uL (ref 0.0–0.5)
Eosinophils Relative: 0 %
HCT: 29.7 % — ABNORMAL LOW (ref 39.0–52.0)
Hemoglobin: 10.2 g/dL — ABNORMAL LOW (ref 13.0–17.0)
Immature Granulocytes: 0 %
Lymphocytes Relative: 28 %
Lymphs Abs: 1.1 10*3/uL (ref 0.7–4.0)
MCH: 33.9 pg (ref 26.0–34.0)
MCHC: 34.3 g/dL (ref 30.0–36.0)
MCV: 98.7 fL (ref 80.0–100.0)
Monocytes Absolute: 0.4 10*3/uL (ref 0.1–1.0)
Monocytes Relative: 10 %
Neutro Abs: 2.4 10*3/uL (ref 1.7–7.7)
Neutrophils Relative %: 62 %
Platelet Count: 214 10*3/uL (ref 150–400)
RBC: 3.01 MIL/uL — ABNORMAL LOW (ref 4.22–5.81)
RDW: 15.6 % — ABNORMAL HIGH (ref 11.5–15.5)
WBC Count: 4 10*3/uL (ref 4.0–10.5)
nRBC: 0 % (ref 0.0–0.2)

## 2020-09-14 MED ORDER — SODIUM CHLORIDE 0.9% FLUSH
10.0000 mL | INTRAVENOUS | Status: DC | PRN
  Administered 2020-09-14: 10 mL
  Filled 2020-09-14: qty 10

## 2020-09-14 MED ORDER — SODIUM CHLORIDE 0.9 % IV SOLN
40.0000 mg | Freq: Once | INTRAVENOUS | Status: AC
  Administered 2020-09-14: 40 mg via INTRAVENOUS
  Filled 2020-09-14: qty 4

## 2020-09-14 MED ORDER — ACETAMINOPHEN 325 MG PO TABS
650.0000 mg | ORAL_TABLET | Freq: Once | ORAL | Status: AC
  Administered 2020-09-14: 650 mg via ORAL

## 2020-09-14 MED ORDER — DARATUMUMAB-HYALURONIDASE-FIHJ 1800-30000 MG-UT/15ML ~~LOC~~ SOLN
1800.0000 mg | Freq: Once | SUBCUTANEOUS | Status: AC
Start: 2020-09-14 — End: 2020-09-14
  Administered 2020-09-14: 1800 mg via SUBCUTANEOUS
  Filled 2020-09-14: qty 15

## 2020-09-14 MED ORDER — HEPARIN SOD (PORK) LOCK FLUSH 100 UNIT/ML IV SOLN
500.0000 [IU] | Freq: Once | INTRAVENOUS | Status: AC | PRN
  Administered 2020-09-14: 500 [IU]
  Filled 2020-09-14: qty 5

## 2020-09-14 MED ORDER — CARFILZOMIB CHEMO INJECTION 60 MG
56.0000 mg/m2 | Freq: Once | INTRAVENOUS | Status: AC
  Administered 2020-09-14: 110 mg via INTRAVENOUS
  Filled 2020-09-14: qty 30

## 2020-09-14 MED ORDER — DIPHENHYDRAMINE HCL 25 MG PO CAPS
50.0000 mg | ORAL_CAPSULE | Freq: Once | ORAL | Status: AC
  Administered 2020-09-14: 50 mg via ORAL

## 2020-09-14 MED ORDER — SODIUM CHLORIDE 0.9 % IV SOLN
Freq: Once | INTRAVENOUS | Status: AC
  Filled 2020-09-14: qty 250

## 2020-09-14 NOTE — Patient Instructions (Signed)
[facility] Discharge Instructions for Patients Receiving Chemotherapy  Today you received the following chemotherapy agents: Carfilzomib (Kyprolis) and Darzalex Faspro  To help prevent nausea and vomiting after your treatment, we encourage you to take your nausea medication as directed by your MD.   If you develop nausea and vomiting that is not controlled by your nausea medication, call the clinic.   BELOW ARE SYMPTOMS THAT SHOULD BE REPORTED IMMEDIATELY:  *FEVER GREATER THAN 100.5 F  *CHILLS WITH OR WITHOUT FEVER  NAUSEA AND VOMITING THAT IS NOT CONTROLLED WITH YOUR NAUSEA MEDICATION  *UNUSUAL SHORTNESS OF BREATH  *UNUSUAL BRUISING OR BLEEDING  TENDERNESS IN MOUTH AND THROAT WITH OR WITHOUT PRESENCE OF ULCERS  *URINARY PROBLEMS  *BOWEL PROBLEMS  UNUSUAL RASH Items with * indicate a potential emergency and should be followed up as soon as possible.  Feel free to call the clinic should you have any questions or concerns. The clinic phone number is [phone].  Please show the [place] at check-in to the Emergency Department and triage nurse.

## 2020-09-15 LAB — KAPPA/LAMBDA LIGHT CHAINS
Kappa free light chain: 15.8 mg/L (ref 3.3–19.4)
Kappa, lambda light chain ratio: 6.58 — ABNORMAL HIGH (ref 0.26–1.65)
Lambda free light chains: 2.4 mg/L — ABNORMAL LOW (ref 5.7–26.3)

## 2020-09-18 ENCOUNTER — Other Ambulatory Visit (HOSPITAL_COMMUNITY): Payer: Self-pay | Admitting: Family Medicine

## 2020-09-18 LAB — MULTIPLE MYELOMA PANEL, SERUM
Albumin SerPl Elph-Mcnc: 3.9 g/dL (ref 2.9–4.4)
Albumin/Glob SerPl: 1.4 (ref 0.7–1.7)
Alpha 1: 0.2 g/dL (ref 0.0–0.4)
Alpha2 Glob SerPl Elph-Mcnc: 0.6 g/dL (ref 0.4–1.0)
B-Globulin SerPl Elph-Mcnc: 0.9 g/dL (ref 0.7–1.3)
Gamma Glob SerPl Elph-Mcnc: 1.3 g/dL (ref 0.4–1.8)
Globulin, Total: 3 g/dL (ref 2.2–3.9)
IgA: 16 mg/dL — ABNORMAL LOW (ref 61–437)
IgG (Immunoglobin G), Serum: 1418 mg/dL (ref 603–1613)
IgM (Immunoglobulin M), Srm: 12 mg/dL — ABNORMAL LOW (ref 20–172)
M Protein SerPl Elph-Mcnc: 1.1 g/dL — ABNORMAL HIGH
Total Protein ELP: 6.9 g/dL (ref 6.0–8.5)

## 2020-09-21 MED FILL — OXYCODONE-APAP 10-325: 10-325 | 30 days supply | Qty: 60 | Fill #0

## 2020-09-22 ENCOUNTER — Inpatient Hospital Stay (HOSPITAL_BASED_OUTPATIENT_CLINIC_OR_DEPARTMENT_OTHER): Payer: Medicare Other | Admitting: Oncology

## 2020-09-22 ENCOUNTER — Inpatient Hospital Stay: Payer: Medicare Other

## 2020-09-22 ENCOUNTER — Other Ambulatory Visit: Payer: Self-pay

## 2020-09-22 VITALS — BP 200/93 | HR 64 | Temp 97.9°F | Resp 18 | Wt 161.3 lb

## 2020-09-22 DIAGNOSIS — C9001 Multiple myeloma in remission: Secondary | ICD-10-CM

## 2020-09-22 DIAGNOSIS — Z79899 Other long term (current) drug therapy: Secondary | ICD-10-CM | POA: Diagnosis not present

## 2020-09-22 DIAGNOSIS — Z5112 Encounter for antineoplastic immunotherapy: Secondary | ICD-10-CM | POA: Diagnosis not present

## 2020-09-22 DIAGNOSIS — C9002 Multiple myeloma in relapse: Secondary | ICD-10-CM | POA: Diagnosis not present

## 2020-09-22 LAB — CBC WITH DIFFERENTIAL (CANCER CENTER ONLY)
Abs Immature Granulocytes: 0.01 10*3/uL (ref 0.00–0.07)
Basophils Absolute: 0 10*3/uL (ref 0.0–0.1)
Basophils Relative: 0 %
Eosinophils Absolute: 0 10*3/uL (ref 0.0–0.5)
Eosinophils Relative: 1 %
HCT: 30.4 % — ABNORMAL LOW (ref 39.0–52.0)
Hemoglobin: 10.2 g/dL — ABNORMAL LOW (ref 13.0–17.0)
Immature Granulocytes: 0 %
Lymphocytes Relative: 30 %
Lymphs Abs: 1.1 10*3/uL (ref 0.7–4.0)
MCH: 33.4 pg (ref 26.0–34.0)
MCHC: 33.6 g/dL (ref 30.0–36.0)
MCV: 99.7 fL (ref 80.0–100.0)
Monocytes Absolute: 0.4 10*3/uL (ref 0.1–1.0)
Monocytes Relative: 11 %
Neutro Abs: 2.1 10*3/uL (ref 1.7–7.7)
Neutrophils Relative %: 58 %
Platelet Count: 176 10*3/uL (ref 150–400)
RBC: 3.05 MIL/uL — ABNORMAL LOW (ref 4.22–5.81)
RDW: 14.8 % (ref 11.5–15.5)
WBC Count: 3.7 10*3/uL — ABNORMAL LOW (ref 4.0–10.5)
nRBC: 0 % (ref 0.0–0.2)

## 2020-09-22 LAB — CMP (CANCER CENTER ONLY)
ALT: 19 U/L (ref 0–44)
AST: 16 U/L (ref 15–41)
Albumin: 3.9 g/dL (ref 3.5–5.0)
Alkaline Phosphatase: 69 U/L (ref 38–126)
Anion gap: 5 (ref 5–15)
BUN: 11 mg/dL (ref 8–23)
CO2: 25 mmol/L (ref 22–32)
Calcium: 8.9 mg/dL (ref 8.9–10.3)
Chloride: 107 mmol/L (ref 98–111)
Creatinine: 0.82 mg/dL (ref 0.61–1.24)
GFR, Estimated: >60 mL/min (ref >60)
Glucose, Bld: 97 mg/dL (ref 70–99)
Potassium: 4.4 mmol/L (ref 3.5–5.1)
Sodium: 137 mmol/L (ref 135–145)
Total Bilirubin: 0.5 mg/dL (ref 0.3–1.2)
Total Protein: 7.2 g/dL (ref 6.5–8.1)

## 2020-09-22 MED ORDER — PEGFILGRASTIM-CBQV 6 MG/0.6ML ~~LOC~~ SOSY
PREFILLED_SYRINGE | SUBCUTANEOUS | Status: AC
  Filled 2020-09-22: qty 0.6

## 2020-09-22 NOTE — Patient Instructions (Signed)

## 2020-09-22 NOTE — Progress Notes (Signed)
Hematology and Oncology Follow Up Visit  Travis Nguyen [id number] 22-Aug-1950 70 y.o. 09/22/2020 10:32 AM [name], [name], MD   Principle Diagnosis: 70 year old man with multiple myeloma diagnosed in 2014.  He developed relapsed in December 2021.    Secondary diagnosis: Stage a T1c, Gleason score 3+4 = 7 prostate cancer that is currently in remission.  Prior Therapy: He was treated initially with Cytoxan, Velcade and Decadron and subsequently his regimen changed to a Velcade, Revlimid with dexamethasone.  He achieved remission at that time.   He is status post a robotic-assisted laparoscopic radical prostatectomy and bilateral lymph node dissection on 02/09/2014. The final pathology showed prostate adenocarcinoma Gleason score 4+3 equals 7 involving both lobes.    Current therapy: Salvage therapy carfilzomib, daratumumab and dexamethasone started on July 28, 2020.  He is receiving carfilzomib weekly with daratumumab.  Interim History:  [name] returns today for a follow-up visit.  Since the last visit, he reports no major changes in his health.  He continues to tolerate therapy without any major complaints.  He denies any nausea, vomiting or abdominal pain.  Denies any worsening neuropathy or palpitation.  He denies any fevers or chills or sweats.  As performance status and quality of life remain excellent.     Medications: Unchanged on review Current Outpatient Medications  Medication Sig Dispense Refill  . lidocaine-prilocaine (EMLA) cream APPLY TOPICALLY TO PORT-A-CATH DAILY AS NEEDED 30 g 2  . losartan (COZAAR) 100 MG tablet Take 100 mg by mouth every morning.     [name] oxyCODONE-acetaminophen (PERCOCET) 10-325 MG tablet Take 1 tablet by mouth as needed.  0   No current facility-administered medications for this visit.     Allergies:  Allergies  Allergen Reactions  . Aspirin     Pt stated "raises BP"        Physical Exam:  Blood pressure (!)  200/93, pulse 64, temperature 97.9 F (36.6 C), temperature source Tympanic, resp. rate 18, weight 161 lb 4.8 oz (73.2 kg), SpO2 99 %.   ECOG: 1    General appearance: Comfortable appearing without any discomfort Head: Normocephalic without any trauma Oropharynx: Mucous membranes are moist and pink without any thrush or ulcers. Eyes: Pupils are equal and round reactive to light. Lymph nodes: No cervical, supraclavicular, inguinal or axillary lymphadenopathy.   Heart:regular rate and rhythm.  S1 and S2 without leg edema. Lung: Clear without any rhonchi or wheezes.  No dullness to percussion. Abdomin: Soft, nontender, nondistended with good bowel sounds.  No hepatosplenomegaly. Musculoskeletal: No joint deformity or effusion.  Full range of motion noted. Neurological: No deficits noted on motor, sensory and deep tendon reflex exam. Skin: No petechial rash or dryness.  Appeared moist.          Lab Results: Lab Results  Component Value Date   WBC 4.0 09/14/2020   HGB 10.2 (L) 09/14/2020   HCT 29.7 (L) 09/14/2020   MCV 98.7 09/14/2020   PLT 214 09/14/2020     Chemistry      Component Value Date/Time   NA 133 (L) 09/14/2020 0923   NA 138 03/27/2017 0838   K 4.4 09/14/2020 0923   K 4.3 03/27/2017 0838   CL 104 09/14/2020 0923   CL 105 01/01/2013 0835   CO2 23 09/14/2020 0923   CO2 24 03/27/2017 0838   BUN 14 09/14/2020 0923   BUN 23.7 03/27/2017 0838   CREATININE 0.82 09/14/2020 0923   CREATININE 1.2 03/27/2017 3790  Component Value Date/Time   CALCIUM 8.8 (L) 09/14/2020 0923   CALCIUM 9.9 03/27/2017 0838   ALKPHOS 81 09/14/2020 0923   ALKPHOS 79 03/27/2017 0838   AST 19 09/14/2020 0923   AST 25 03/27/2017 0838   ALT 25 09/14/2020 0923   ALT 26 03/27/2017 0838   BILITOT 0.4 09/14/2020 0923   BILITOT 0.31 03/27/2017 0838      Results for ZAKARIYYA, HELFMAN (MRN [id number]) as of 09/22/2020 10:33  Ref. Range 06/13/2020 08:04 08/17/2020 13:21 09/14/2020 09:23   M Protein SerPl Elph-Mcnc Latest Ref Range: Not Observed g/dL 1.8 (H) 1.4 (H) 1.1 (H)  IFE 1 Unknown Comment (A) Comment (A) Comment (A)  Globulin, Total Latest Ref Range: 2.2 - 3.9 g/dL 4.0 (H) 3.5 3.0  B-Globulin SerPl Elph-Mcnc Latest Ref Range: 0.7 - 1.3 g/dL 0.9 0.7 0.9  IgG (Immunoglobin G), Serum Latest Ref Range: 603 - 1,613 mg/dL 2,418 (H) 1,808 (H) 1,418  IgM (Immunoglobulin M), Srm Latest Ref Range: 20 - 172 mg/dL 19 (L) 16 (L) 12 (L)     Impression and Plan:  70 year old man with:   1.  Multiple myeloma diagnosed in 2014.  He developed relapsed IgG kappa with increased plasma cell infiltration in the bone marrow.  The natural course of his disease was reviewed at this time and treatment options were discussed.  He continues to be on weekly carfilzomib with daratumumab.  Day 21 of carfilzomib has been withheld previously because of neutropenia.  Risks and benefits of continuing this treatment were discussed today.  Tensional complications of include neutropenia, peripheral neuropathy and infusion related complications were discussed.  He is agreeable to continue at this time.  Protein studies on March 3 continues to show improvement with decline in his M spike to 1.1 g/dL normalization of his IgG level.  The plan is to continue with the weekly carfilzomib with the intermittent week off to allow for his neutropenia to resolve.  Laboratory data from today reviewed and will resume the therapy next week as scheduled.  2.  Prostate cancer: Not active at this time without any indication for treatment.   3.  IV access: Port-A-Cath currently in use without any issues.  4.  Antiemetics: Compazine is available to her without any nausea vomiting.  5.  VZV prophylaxis: I recommended acyclovir for prophylaxis and preventing of reactivation.   6. Followup: He will continue with weekly therapy and MD follow-up in 4 weeks.  30  minutes were dedicated to this encounter.  Time spent on  reviewing disease status, discussing treatment options and addressing complications related to his cancer and cancer therapy.    Zola Button, MD 3/11/202210:32 AM

## 2020-09-26 ENCOUNTER — Telehealth: Payer: Self-pay | Admitting: Oncology

## 2020-09-26 NOTE — Telephone Encounter (Signed)
Scheduled follow-up appointment per 3/11 los. Patient is aware.

## 2020-09-28 ENCOUNTER — Other Ambulatory Visit: Payer: Self-pay

## 2020-09-28 ENCOUNTER — Inpatient Hospital Stay: Payer: Medicare Other

## 2020-09-28 ENCOUNTER — Other Ambulatory Visit: Payer: Self-pay | Admitting: Oncology

## 2020-09-28 VITALS — BP 166/100 | HR 56 | Temp 98.8°F | Resp 18 | Wt 161.5 lb

## 2020-09-28 DIAGNOSIS — C9001 Multiple myeloma in remission: Secondary | ICD-10-CM

## 2020-09-28 DIAGNOSIS — Z79899 Other long term (current) drug therapy: Secondary | ICD-10-CM | POA: Diagnosis not present

## 2020-09-28 DIAGNOSIS — Z95828 Presence of other vascular implants and grafts: Secondary | ICD-10-CM

## 2020-09-28 DIAGNOSIS — C9002 Multiple myeloma in relapse: Secondary | ICD-10-CM | POA: Diagnosis not present

## 2020-09-28 DIAGNOSIS — Z5112 Encounter for antineoplastic immunotherapy: Secondary | ICD-10-CM | POA: Diagnosis not present

## 2020-09-28 DIAGNOSIS — C61 Malignant neoplasm of prostate: Secondary | ICD-10-CM

## 2020-09-28 LAB — CBC WITH DIFFERENTIAL (CANCER CENTER ONLY)
Abs Immature Granulocytes: 0.01 10*3/uL (ref 0.00–0.07)
Basophils Absolute: 0 10*3/uL (ref 0.0–0.1)
Basophils Relative: 0 %
Eosinophils Absolute: 0 10*3/uL (ref 0.0–0.5)
Eosinophils Relative: 0 %
HCT: 33 % — ABNORMAL LOW (ref 39.0–52.0)
Hemoglobin: 10.9 g/dL — ABNORMAL LOW (ref 13.0–17.0)
Immature Granulocytes: 0 %
Lymphocytes Relative: 31 %
Lymphs Abs: 1.3 10*3/uL (ref 0.7–4.0)
MCH: 33.6 pg (ref 26.0–34.0)
MCHC: 33 g/dL (ref 30.0–36.0)
MCV: 101.9 fL — ABNORMAL HIGH (ref 80.0–100.0)
Monocytes Absolute: 0.4 10*3/uL (ref 0.1–1.0)
Monocytes Relative: 9 %
Neutro Abs: 2.5 10*3/uL (ref 1.7–7.7)
Neutrophils Relative %: 60 %
Platelet Count: 226 10*3/uL (ref 150–400)
RBC: 3.24 MIL/uL — ABNORMAL LOW (ref 4.22–5.81)
RDW: 14.5 % (ref 11.5–15.5)
WBC Count: 4.1 10*3/uL (ref 4.0–10.5)
nRBC: 0 % (ref 0.0–0.2)

## 2020-09-28 LAB — CMP (CANCER CENTER ONLY)
ALT: 27 U/L (ref 0–44)
AST: 26 U/L (ref 15–41)
Albumin: 4.2 g/dL (ref 3.5–5.0)
Alkaline Phosphatase: 82 U/L (ref 38–126)
Anion gap: 9 (ref 5–15)
BUN: 13 mg/dL (ref 8–23)
CO2: 24 mmol/L (ref 22–32)
Calcium: 8.9 mg/dL (ref 8.9–10.3)
Chloride: 105 mmol/L (ref 98–111)
Creatinine: 0.83 mg/dL (ref 0.61–1.24)
GFR, Estimated: >60 mL/min (ref >60)
Glucose, Bld: 100 mg/dL — ABNORMAL HIGH (ref 70–99)
Potassium: 4.6 mmol/L (ref 3.5–5.1)
Sodium: 138 mmol/L (ref 135–145)
Total Bilirubin: 0.4 mg/dL (ref 0.3–1.2)
Total Protein: 7.8 g/dL (ref 6.5–8.1)

## 2020-09-28 MED ORDER — SODIUM CHLORIDE 0.9 % IV SOLN
40.0000 mg | Freq: Once | INTRAVENOUS | Status: AC
  Administered 2020-09-28: 40 mg via INTRAVENOUS
  Filled 2020-09-28: qty 4

## 2020-09-28 MED ORDER — ACYCLOVIR 400 MG PO TABS: 400.0000 mg | ORAL_TABLET | Freq: Every day | ORAL | 3 refills | Status: DC

## 2020-09-28 MED ORDER — SODIUM CHLORIDE 0.9% FLUSH
10.0000 mL | INTRAVENOUS | Status: DC | PRN
  Administered 2020-09-28: 10 mL
  Filled 2020-09-28: qty 10

## 2020-09-28 MED ORDER — DIPHENHYDRAMINE HCL 25 MG PO CAPS
ORAL_CAPSULE | ORAL | Status: AC
  Filled 2020-09-28: qty 2

## 2020-09-28 MED ORDER — DEXTROSE 5 % IV SOLN
56.0000 mg/m2 | Freq: Once | INTRAVENOUS | Status: AC
  Administered 2020-09-28: 110 mg via INTRAVENOUS
  Filled 2020-09-28: qty 10

## 2020-09-28 MED ORDER — ACETAMINOPHEN 325 MG PO TABS
650.0000 mg | ORAL_TABLET | Freq: Once | ORAL | Status: AC
  Administered 2020-09-28: 650 mg via ORAL

## 2020-09-28 MED ORDER — DARATUMUMAB-HYALURONIDASE-FIHJ 1800-30000 MG-UT/15ML ~~LOC~~ SOLN
1800.0000 mg | Freq: Once | SUBCUTANEOUS | Status: AC
  Administered 2020-09-28: 1800 mg via SUBCUTANEOUS
  Filled 2020-09-28: qty 15

## 2020-09-28 MED ORDER — SODIUM CHLORIDE 0.9 % IV SOLN
Freq: Once | INTRAVENOUS | Status: AC
  Filled 2020-09-28: qty 250

## 2020-09-28 MED ORDER — HEPARIN SOD (PORK) LOCK FLUSH 100 UNIT/ML IV SOLN
500.0000 [IU] | Freq: Once | INTRAVENOUS | Status: AC | PRN
  Administered 2020-09-28: 500 [IU]
  Filled 2020-09-28: qty 5

## 2020-09-28 MED ORDER — DIPHENHYDRAMINE HCL 25 MG PO CAPS
50.0000 mg | ORAL_CAPSULE | Freq: Once | ORAL | Status: AC
  Administered 2020-09-28: 50 mg via ORAL

## 2020-09-28 MED ORDER — ACETAMINOPHEN 325 MG PO TABS
ORAL_TABLET | ORAL | Status: AC
  Filled 2020-09-28: qty 2

## 2020-09-28 NOTE — Patient Instructions (Signed)
[facility] Discharge Instructions for Patients Receiving Chemotherapy  Today you received the following chemotherapy agents: Kyprolis/Darzalex Faspro.  To help prevent nausea and vomiting after your treatment, we encourage you to take your nausea medication as directed.   If you develop nausea and vomiting that is not controlled by your nausea medication, call the clinic.   BELOW ARE SYMPTOMS THAT SHOULD BE REPORTED IMMEDIATELY:  *FEVER GREATER THAN 100.5 F  *CHILLS WITH OR WITHOUT FEVER  NAUSEA AND VOMITING THAT IS NOT CONTROLLED WITH YOUR NAUSEA MEDICATION  *UNUSUAL SHORTNESS OF BREATH  *UNUSUAL BRUISING OR BLEEDING  TENDERNESS IN MOUTH AND THROAT WITH OR WITHOUT PRESENCE OF ULCERS  *URINARY PROBLEMS  *BOWEL PROBLEMS  UNUSUAL RASH Items with * indicate a potential emergency and should be followed up as soon as possible.  Feel free to call the clinic should you have any questions or concerns. The clinic phone number is [phone].  Please show the CHEMO ALERT CARD at check-in to the Emergency Department and triage nurse.   

## 2020-09-28 NOTE — Progress Notes (Signed)
Per [name], okay to treat with elevated BP.

## 2020-10-05 ENCOUNTER — Inpatient Hospital Stay: Payer: Medicare Other

## 2020-10-05 ENCOUNTER — Other Ambulatory Visit: Payer: Self-pay

## 2020-10-05 VITALS — BP 193/96 | HR 61 | Temp 98.3°F | Resp 16

## 2020-10-05 DIAGNOSIS — C9001 Multiple myeloma in remission: Secondary | ICD-10-CM

## 2020-10-05 DIAGNOSIS — C9002 Multiple myeloma in relapse: Secondary | ICD-10-CM | POA: Diagnosis not present

## 2020-10-05 DIAGNOSIS — Z5112 Encounter for antineoplastic immunotherapy: Secondary | ICD-10-CM | POA: Diagnosis not present

## 2020-10-05 DIAGNOSIS — C61 Malignant neoplasm of prostate: Secondary | ICD-10-CM

## 2020-10-05 DIAGNOSIS — Z95828 Presence of other vascular implants and grafts: Secondary | ICD-10-CM

## 2020-10-05 DIAGNOSIS — Z79899 Other long term (current) drug therapy: Secondary | ICD-10-CM | POA: Diagnosis not present

## 2020-10-05 LAB — CBC WITH DIFFERENTIAL (CANCER CENTER ONLY)
Abs Immature Granulocytes: 0.01 10*3/uL (ref 0.00–0.07)
Basophils Absolute: 0 10*3/uL (ref 0.0–0.1)
Basophils Relative: 1 %
Eosinophils Absolute: 0 10*3/uL (ref 0.0–0.5)
Eosinophils Relative: 1 %
HCT: 32.1 % — ABNORMAL LOW (ref 39.0–52.0)
Hemoglobin: 11.1 g/dL — ABNORMAL LOW (ref 13.0–17.0)
Immature Granulocytes: 0 %
Lymphocytes Relative: 28 %
Lymphs Abs: 1 10*3/uL (ref 0.7–4.0)
MCH: 34.7 pg — ABNORMAL HIGH (ref 26.0–34.0)
MCHC: 34.6 g/dL (ref 30.0–36.0)
MCV: 100.3 fL — ABNORMAL HIGH (ref 80.0–100.0)
Monocytes Absolute: 0.5 10*3/uL (ref 0.1–1.0)
Monocytes Relative: 12 %
Neutro Abs: 2.2 10*3/uL (ref 1.7–7.7)
Neutrophils Relative %: 58 %
Platelet Count: 139 10*3/uL — ABNORMAL LOW (ref 150–400)
RBC: 3.2 MIL/uL — ABNORMAL LOW (ref 4.22–5.81)
RDW: 13.9 % (ref 11.5–15.5)
WBC Count: 3.7 10*3/uL — ABNORMAL LOW (ref 4.0–10.5)
nRBC: 0 % (ref 0.0–0.2)

## 2020-10-05 LAB — CMP (CANCER CENTER ONLY)
ALT: 31 U/L (ref 0–44)
AST: 21 U/L (ref 15–41)
Albumin: 4.2 g/dL (ref 3.5–5.0)
Alkaline Phosphatase: 73 U/L (ref 38–126)
Anion gap: 9 (ref 5–15)
BUN: 13 mg/dL (ref 8–23)
CO2: 24 mmol/L (ref 22–32)
Calcium: 8.8 mg/dL — ABNORMAL LOW (ref 8.9–10.3)
Chloride: 105 mmol/L (ref 98–111)
Creatinine: 0.81 mg/dL (ref 0.61–1.24)
GFR, Estimated: >60 mL/min (ref >60)
Glucose, Bld: 91 mg/dL (ref 70–99)
Potassium: 4.6 mmol/L (ref 3.5–5.1)
Sodium: 138 mmol/L (ref 135–145)
Total Bilirubin: 0.6 mg/dL (ref 0.3–1.2)
Total Protein: 7.8 g/dL (ref 6.5–8.1)

## 2020-10-05 MED ORDER — DARATUMUMAB-HYALURONIDASE-FIHJ 1800-30000 MG-UT/15ML ~~LOC~~ SOLN
1800.0000 mg | Freq: Once | SUBCUTANEOUS | Status: AC
  Administered 2020-10-05: 1800 mg via SUBCUTANEOUS
  Filled 2020-10-05: qty 15

## 2020-10-05 MED ORDER — SODIUM CHLORIDE 0.9% FLUSH
10.0000 mL | INTRAVENOUS | Status: DC | PRN
  Administered 2020-10-05: 10 mL
  Filled 2020-10-05: qty 10

## 2020-10-05 MED ORDER — SODIUM CHLORIDE 0.9 % IV SOLN
Freq: Once | INTRAVENOUS | Status: AC
  Filled 2020-10-05: qty 250

## 2020-10-05 MED ORDER — SODIUM CHLORIDE 0.9% FLUSH
10.0000 mL | INTRAVENOUS | Status: DC | PRN
Start: 2020-10-05 — End: 2020-10-05
  Administered 2020-10-05: 10 mL
  Filled 2020-10-05: qty 10

## 2020-10-05 MED ORDER — SODIUM CHLORIDE 0.9 % IV SOLN
Freq: Once | INTRAVENOUS | Status: DC
  Filled 2020-10-05: qty 250

## 2020-10-05 MED ORDER — HEPARIN SOD (PORK) LOCK FLUSH 100 UNIT/ML IV SOLN
500.0000 [IU] | Freq: Once | INTRAVENOUS | Status: AC | PRN
  Administered 2020-10-05: 500 [IU]
  Filled 2020-10-05: qty 5

## 2020-10-05 MED ORDER — DEXTROSE 5 % IV SOLN
56.0000 mg/m2 | Freq: Once | INTRAVENOUS | Status: AC
  Administered 2020-10-05: 110 mg via INTRAVENOUS
  Filled 2020-10-05: qty 30

## 2020-10-05 MED ORDER — DIPHENHYDRAMINE HCL 25 MG PO CAPS
ORAL_CAPSULE | ORAL | Status: AC
  Filled 2020-10-05: qty 2

## 2020-10-05 MED ORDER — ACETAMINOPHEN 325 MG PO TABS
ORAL_TABLET | ORAL | Status: AC
  Filled 2020-10-05: qty 2

## 2020-10-05 MED ORDER — DIPHENHYDRAMINE HCL 25 MG PO CAPS
50.0000 mg | ORAL_CAPSULE | Freq: Once | ORAL | Status: AC
  Administered 2020-10-05: 50 mg via ORAL

## 2020-10-05 MED ORDER — ACETAMINOPHEN 325 MG PO TABS
650.0000 mg | ORAL_TABLET | Freq: Once | ORAL | Status: AC
  Administered 2020-10-05: 650 mg via ORAL

## 2020-10-05 MED ORDER — SODIUM CHLORIDE 0.9 % IV SOLN
40.0000 mg | Freq: Once | INTRAVENOUS | Status: AC
  Administered 2020-10-05: 40 mg via INTRAVENOUS
  Filled 2020-10-05: qty 4

## 2020-10-05 NOTE — Progress Notes (Signed)
Okay to treat with BP of 193/96 per [name]. Patient denies chest pain, blurry vision, shortness of breath, or pain.

## 2020-10-05 NOTE — Patient Instructions (Signed)
[facility] Discharge Instructions for Patients Receiving Chemotherapy  Today you received the following chemotherapy agents:  Kyprolis  To help prevent nausea and vomiting after your treatment, we encourage you to take your nausea medication as directed.   If you develop nausea and vomiting that is not controlled by your nausea medication, call the clinic.   BELOW ARE SYMPTOMS THAT SHOULD BE REPORTED IMMEDIATELY:  *FEVER GREATER THAN 100.5 F  *CHILLS WITH OR WITHOUT FEVER  NAUSEA AND VOMITING THAT IS NOT CONTROLLED WITH YOUR NAUSEA MEDICATION  *UNUSUAL SHORTNESS OF BREATH  *UNUSUAL BRUISING OR BLEEDING  TENDERNESS IN MOUTH AND THROAT WITH OR WITHOUT PRESENCE OF ULCERS  *URINARY PROBLEMS  *BOWEL PROBLEMS  UNUSUAL RASH Items with * indicate a potential emergency and should be followed up as soon as possible.  Feel free to call the clinic should you have any questions or concerns. The clinic phone number is [phone].  Please show the CHEMO ALERT CARD at check-in to the Emergency Department and triage nurse.   

## 2020-10-06 MED FILL — LOSARTAN POTASSIUM 100 MG T: 100 | 90 days supply | Qty: 90 | Fill #0

## 2020-10-12 ENCOUNTER — Inpatient Hospital Stay: Payer: Medicare Other

## 2020-10-12 ENCOUNTER — Other Ambulatory Visit: Payer: Self-pay

## 2020-10-12 VITALS — BP 199/95 | HR 60 | Resp 16 | Wt 162.0 lb

## 2020-10-12 DIAGNOSIS — C9002 Multiple myeloma in relapse: Secondary | ICD-10-CM | POA: Diagnosis not present

## 2020-10-12 DIAGNOSIS — Z5112 Encounter for antineoplastic immunotherapy: Secondary | ICD-10-CM | POA: Diagnosis not present

## 2020-10-12 DIAGNOSIS — Z79899 Other long term (current) drug therapy: Secondary | ICD-10-CM | POA: Diagnosis not present

## 2020-10-12 DIAGNOSIS — C9001 Multiple myeloma in remission: Secondary | ICD-10-CM

## 2020-10-12 DIAGNOSIS — C61 Malignant neoplasm of prostate: Secondary | ICD-10-CM

## 2020-10-12 DIAGNOSIS — Z95828 Presence of other vascular implants and grafts: Secondary | ICD-10-CM

## 2020-10-12 LAB — CBC WITH DIFFERENTIAL (CANCER CENTER ONLY)
Abs Immature Granulocytes: 0.01 10*3/uL (ref 0.00–0.07)
Basophils Absolute: 0 10*3/uL (ref 0.0–0.1)
Basophils Relative: 0 %
Eosinophils Absolute: 0 10*3/uL (ref 0.0–0.5)
Eosinophils Relative: 1 %
HCT: 30.4 % — ABNORMAL LOW (ref 39.0–52.0)
Hemoglobin: 10.5 g/dL — ABNORMAL LOW (ref 13.0–17.0)
Immature Granulocytes: 0 %
Lymphocytes Relative: 30 %
Lymphs Abs: 1.2 10*3/uL (ref 0.7–4.0)
MCH: 35 pg — ABNORMAL HIGH (ref 26.0–34.0)
MCHC: 34.5 g/dL (ref 30.0–36.0)
MCV: 101.3 fL — ABNORMAL HIGH (ref 80.0–100.0)
Monocytes Absolute: 0.4 10*3/uL (ref 0.1–1.0)
Monocytes Relative: 9 %
Neutro Abs: 2.4 10*3/uL (ref 1.7–7.7)
Neutrophils Relative %: 60 %
Platelet Count: 159 10*3/uL (ref 150–400)
RBC: 3 MIL/uL — ABNORMAL LOW (ref 4.22–5.81)
RDW: 13.8 % (ref 11.5–15.5)
WBC Count: 4 10*3/uL (ref 4.0–10.5)
nRBC: 0 % (ref 0.0–0.2)

## 2020-10-12 LAB — CMP (CANCER CENTER ONLY)
ALT: 22 U/L (ref 0–44)
AST: 18 U/L (ref 15–41)
Albumin: 4 g/dL (ref 3.5–5.0)
Alkaline Phosphatase: 70 U/L (ref 38–126)
Anion gap: 8 (ref 5–15)
BUN: 16 mg/dL (ref 8–23)
CO2: 25 mmol/L (ref 22–32)
Calcium: 8.7 mg/dL — ABNORMAL LOW (ref 8.9–10.3)
Chloride: 104 mmol/L (ref 98–111)
Creatinine: 0.79 mg/dL (ref 0.61–1.24)
GFR, Estimated: >60 mL/min (ref >60)
Glucose, Bld: 95 mg/dL (ref 70–99)
Potassium: 4.3 mmol/L (ref 3.5–5.1)
Sodium: 137 mmol/L (ref 135–145)
Total Bilirubin: 0.6 mg/dL (ref 0.3–1.2)
Total Protein: 7.2 g/dL (ref 6.5–8.1)

## 2020-10-12 MED ORDER — DIPHENHYDRAMINE HCL 25 MG PO CAPS
50.0000 mg | ORAL_CAPSULE | Freq: Once | ORAL | Status: AC
  Administered 2020-10-12: 50 mg via ORAL

## 2020-10-12 MED ORDER — HEPARIN SOD (PORK) LOCK FLUSH 100 UNIT/ML IV SOLN
500.0000 [IU] | Freq: Once | INTRAVENOUS | Status: AC | PRN
  Administered 2020-10-12: 500 [IU]
  Filled 2020-10-12: qty 5

## 2020-10-12 MED ORDER — DEXTROSE 5 % IV SOLN
56.0000 mg/m2 | Freq: Once | INTRAVENOUS | Status: AC
  Administered 2020-10-12: 110 mg via INTRAVENOUS
  Filled 2020-10-12: qty 10

## 2020-10-12 MED ORDER — SODIUM CHLORIDE 0.9% FLUSH
10.0000 mL | INTRAVENOUS | Status: DC | PRN
  Administered 2020-10-12: 10 mL
  Filled 2020-10-12: qty 10

## 2020-10-12 MED ORDER — ACETAMINOPHEN 325 MG PO TABS
ORAL_TABLET | ORAL | Status: AC
  Filled 2020-10-12: qty 2

## 2020-10-12 MED ORDER — DIPHENHYDRAMINE HCL 25 MG PO CAPS
ORAL_CAPSULE | ORAL | Status: AC
  Filled 2020-10-12: qty 2

## 2020-10-12 MED ORDER — SODIUM CHLORIDE 0.9 % IV SOLN
40.0000 mg | Freq: Once | INTRAVENOUS | Status: AC
  Administered 2020-10-12: 40 mg via INTRAVENOUS
  Filled 2020-10-12: qty 4

## 2020-10-12 MED ORDER — ACETAMINOPHEN 325 MG PO TABS
650.0000 mg | ORAL_TABLET | Freq: Once | ORAL | Status: AC
  Administered 2020-10-12: 650 mg via ORAL

## 2020-10-12 MED ORDER — SODIUM CHLORIDE 0.9 % IV SOLN
Freq: Once | INTRAVENOUS | Status: AC
  Filled 2020-10-12: qty 250

## 2020-10-12 MED ORDER — DARATUMUMAB-HYALURONIDASE-FIHJ 1800-30000 MG-UT/15ML ~~LOC~~ SOLN
1800.0000 mg | Freq: Once | SUBCUTANEOUS | Status: AC
  Administered 2020-10-12: 1800 mg via SUBCUTANEOUS
  Filled 2020-10-12: qty 15

## 2020-10-12 NOTE — Progress Notes (Signed)
Per [name], ok to treat with elevated BP.

## 2020-10-12 NOTE — Patient Instructions (Signed)
[facility] Discharge Instructions for Patients Receiving Chemotherapy  Today you received the following chemotherapy agents: Kyprolis/Darzalex Faspro.  To help prevent nausea and vomiting after your treatment, we encourage you to take your nausea medication as directed.   If you develop nausea and vomiting that is not controlled by your nausea medication, call the clinic.   BELOW ARE SYMPTOMS THAT SHOULD BE REPORTED IMMEDIATELY:  *FEVER GREATER THAN 100.5 F  *CHILLS WITH OR WITHOUT FEVER  NAUSEA AND VOMITING THAT IS NOT CONTROLLED WITH YOUR NAUSEA MEDICATION  *UNUSUAL SHORTNESS OF BREATH  *UNUSUAL BRUISING OR BLEEDING  TENDERNESS IN MOUTH AND THROAT WITH OR WITHOUT PRESENCE OF ULCERS  *URINARY PROBLEMS  *BOWEL PROBLEMS  UNUSUAL RASH Items with * indicate a potential emergency and should be followed up as soon as possible.  Feel free to call the clinic should you have any questions or concerns. The clinic phone number is [phone].  Please show the [place] at check-in to the Emergency Department and triage nurse.

## 2020-10-13 LAB — KAPPA/LAMBDA LIGHT CHAINS
Kappa free light chain: 17 mg/L (ref 3.3–19.4)
Kappa, lambda light chain ratio: 7.08 — ABNORMAL HIGH (ref 0.26–1.65)
Lambda free light chains: 2.4 mg/L — ABNORMAL LOW (ref 5.7–26.3)

## 2020-10-14 ENCOUNTER — Other Ambulatory Visit (HOSPITAL_COMMUNITY): Payer: Self-pay

## 2020-10-16 ENCOUNTER — Other Ambulatory Visit (HOSPITAL_COMMUNITY): Payer: Self-pay

## 2020-10-16 LAB — MULTIPLE MYELOMA PANEL, SERUM
Albumin SerPl Elph-Mcnc: 3.7 g/dL (ref 2.9–4.4)
Albumin/Glob SerPl: 1.2 (ref 0.7–1.7)
Alpha 1: 0.2 g/dL (ref 0.0–0.4)
Alpha2 Glob SerPl Elph-Mcnc: 0.7 g/dL (ref 0.4–1.0)
B-Globulin SerPl Elph-Mcnc: 1 g/dL (ref 0.7–1.3)
Gamma Glob SerPl Elph-Mcnc: 1.2 g/dL (ref 0.4–1.8)
Globulin, Total: 3.1 g/dL (ref 2.2–3.9)
IgA: 16 mg/dL — ABNORMAL LOW (ref 61–437)
IgG (Immunoglobin G), Serum: 1258 mg/dL (ref 603–1613)
IgM (Immunoglobulin M), Srm: 12 mg/dL — ABNORMAL LOW (ref 20–172)
M Protein SerPl Elph-Mcnc: 1 g/dL — ABNORMAL HIGH
Total Protein ELP: 6.8 g/dL (ref 6.0–8.5)

## 2020-10-16 MED ORDER — LOSARTAN POTASSIUM 100 MG PO TABS
100.0000 mg | ORAL_TABLET | ORAL | 1 refills | Status: DC
  Filled 2020-10-16: qty 90, 90d supply, fill #0

## 2020-10-16 MED ORDER — LOSARTAN POTASSIUM 100 MG PO TABS: 100.0000 mg | ORAL_TABLET | Freq: Every day | ORAL | 1 refills | Status: DC

## 2020-10-17 ENCOUNTER — Other Ambulatory Visit (HOSPITAL_COMMUNITY): Payer: Self-pay

## 2020-10-18 ENCOUNTER — Other Ambulatory Visit (HOSPITAL_COMMUNITY): Payer: Self-pay

## 2020-10-18 MED ORDER — OXYCODONE-ACETAMINOPHEN 10-325 MG PO TABS
1.0000 | ORAL_TABLET | Freq: Two times a day (BID) | ORAL | 0 refills | Status: DC
  Filled 2020-10-18 – 2020-10-20 (×3): qty 60, 30d supply, fill #0

## 2020-10-19 ENCOUNTER — Inpatient Hospital Stay: Payer: Medicare Other | Attending: Oncology

## 2020-10-19 ENCOUNTER — Inpatient Hospital Stay: Payer: Medicare Other

## 2020-10-19 ENCOUNTER — Other Ambulatory Visit: Payer: Self-pay

## 2020-10-19 ENCOUNTER — Other Ambulatory Visit (HOSPITAL_COMMUNITY): Payer: Self-pay

## 2020-10-19 ENCOUNTER — Inpatient Hospital Stay (HOSPITAL_BASED_OUTPATIENT_CLINIC_OR_DEPARTMENT_OTHER): Payer: Medicare Other | Admitting: Oncology

## 2020-10-19 VITALS — BP 174/88 | HR 57 | Temp 96.3°F | Resp 18 | Wt 160.0 lb

## 2020-10-19 DIAGNOSIS — C9001 Multiple myeloma in remission: Secondary | ICD-10-CM | POA: Diagnosis not present

## 2020-10-19 DIAGNOSIS — Z5112 Encounter for antineoplastic immunotherapy: Secondary | ICD-10-CM | POA: Insufficient documentation

## 2020-10-19 DIAGNOSIS — C9002 Multiple myeloma in relapse: Secondary | ICD-10-CM | POA: Insufficient documentation

## 2020-10-19 DIAGNOSIS — Z8546 Personal history of malignant neoplasm of prostate: Secondary | ICD-10-CM | POA: Diagnosis not present

## 2020-10-19 DIAGNOSIS — Z79899 Other long term (current) drug therapy: Secondary | ICD-10-CM | POA: Diagnosis not present

## 2020-10-19 LAB — CBC WITH DIFFERENTIAL (CANCER CENTER ONLY)
Abs Immature Granulocytes: 0 10*3/uL (ref 0.00–0.07)
Basophils Absolute: 0 10*3/uL (ref 0.0–0.1)
Basophils Relative: 0 %
Eosinophils Absolute: 0 10*3/uL (ref 0.0–0.5)
Eosinophils Relative: 0 %
HCT: 33.2 % — ABNORMAL LOW (ref 39.0–52.0)
Hemoglobin: 11.1 g/dL — ABNORMAL LOW (ref 13.0–17.0)
Immature Granulocytes: 0 %
Lymphocytes Relative: 25 %
Lymphs Abs: 1.1 10*3/uL (ref 0.7–4.0)
MCH: 34.7 pg — ABNORMAL HIGH (ref 26.0–34.0)
MCHC: 33.4 g/dL (ref 30.0–36.0)
MCV: 103.8 fL — ABNORMAL HIGH (ref 80.0–100.0)
Monocytes Absolute: 0.5 10*3/uL (ref 0.1–1.0)
Monocytes Relative: 12 %
Neutro Abs: 2.8 10*3/uL (ref 1.7–7.7)
Neutrophils Relative %: 63 %
Platelet Count: 159 10*3/uL (ref 150–400)
RBC: 3.2 MIL/uL — ABNORMAL LOW (ref 4.22–5.81)
RDW: 13.3 % (ref 11.5–15.5)
WBC Count: 4.5 10*3/uL (ref 4.0–10.5)
nRBC: 0 % (ref 0.0–0.2)

## 2020-10-19 LAB — CMP (CANCER CENTER ONLY)
ALT: 43 U/L (ref 0–44)
AST: 27 U/L (ref 15–41)
Albumin: 4 g/dL (ref 3.5–5.0)
Alkaline Phosphatase: 72 U/L (ref 38–126)
Anion gap: 9 (ref 5–15)
BUN: 11 mg/dL (ref 8–23)
CO2: 23 mmol/L (ref 22–32)
Calcium: 8.6 mg/dL — ABNORMAL LOW (ref 8.9–10.3)
Chloride: 104 mmol/L (ref 98–111)
Creatinine: 0.82 mg/dL (ref 0.61–1.24)
GFR, Estimated: >60 mL/min (ref >60)
Glucose, Bld: 87 mg/dL (ref 70–99)
Potassium: 4.7 mmol/L (ref 3.5–5.1)
Sodium: 136 mmol/L (ref 135–145)
Total Bilirubin: 0.6 mg/dL (ref 0.3–1.2)
Total Protein: 7.3 g/dL (ref 6.5–8.1)

## 2020-10-19 MED ORDER — HEPARIN SOD (PORK) LOCK FLUSH 100 UNIT/ML IV SOLN
500.0000 [IU] | Freq: Once | INTRAVENOUS | Status: AC | PRN
  Administered 2020-10-19: 500 [IU]
  Filled 2020-10-19: qty 5

## 2020-10-19 MED ORDER — SODIUM CHLORIDE 0.9 % IV SOLN
40.0000 mg | Freq: Once | INTRAVENOUS | Status: AC
  Administered 2020-10-19: 40 mg via INTRAVENOUS
  Filled 2020-10-19: qty 4

## 2020-10-19 MED ORDER — SODIUM CHLORIDE 0.9% FLUSH
10.0000 mL | INTRAVENOUS | Status: DC | PRN
  Administered 2020-10-19: 10 mL
  Filled 2020-10-19: qty 10

## 2020-10-19 MED ORDER — DEXTROSE 5 % IV SOLN
56.0000 mg/m2 | Freq: Once | INTRAVENOUS | Status: AC
  Administered 2020-10-19: 110 mg via INTRAVENOUS
  Filled 2020-10-19: qty 30

## 2020-10-19 MED ORDER — DIPHENHYDRAMINE HCL 25 MG PO CAPS
ORAL_CAPSULE | ORAL | Status: AC
  Filled 2020-10-19: qty 2

## 2020-10-19 MED ORDER — ACETAMINOPHEN 325 MG PO TABS
ORAL_TABLET | ORAL | Status: AC
  Filled 2020-10-19: qty 2

## 2020-10-19 MED ORDER — DIPHENHYDRAMINE HCL 25 MG PO CAPS
50.0000 mg | ORAL_CAPSULE | Freq: Once | ORAL | Status: AC
  Administered 2020-10-19: 50 mg via ORAL

## 2020-10-19 MED ORDER — DARATUMUMAB-HYALURONIDASE-FIHJ 1800-30000 MG-UT/15ML ~~LOC~~ SOLN: 1800.0000 mg | Freq: Once | SUBCUTANEOUS | Status: DC

## 2020-10-19 MED ORDER — ACETAMINOPHEN 325 MG PO TABS
650.0000 mg | ORAL_TABLET | Freq: Once | ORAL | Status: AC
  Administered 2020-10-19: 650 mg via ORAL

## 2020-10-19 MED ORDER — SODIUM CHLORIDE 0.9 % IV SOLN
Freq: Once | INTRAVENOUS | Status: AC
  Filled 2020-10-19: qty 250

## 2020-10-19 NOTE — Progress Notes (Signed)
Per [name], pt will receive Kyprolis only today. Tx plan updated.  Kennith Center, Pharm.D., CPP 10/19/2020@11 :16 AM

## 2020-10-19 NOTE — Progress Notes (Signed)
Hematology and Oncology Follow Up Visit  Travis Nguyen [id number] 07-Apr-1951 70 y.o. 10/19/2020 9:28 AM [name], [name], MD   Principle Diagnosis: 70 year old man with IgG kappa multiple myeloma with relapsed disease in January 2022 after initially diagnosed in 2014.     Secondary diagnosis: Stage a T1c, Gleason score 3+4 = 7 prostate cancer that is currently in remission.  Prior Therapy: Travis Nguyen was treated initially with Cytoxan, Velcade and Decadron and subsequently his regimen changed to a Velcade, Revlimid with dexamethasone.  Travis Nguyen achieved remission at that time.   Travis Nguyen is status post a robotic-assisted laparoscopic radical prostatectomy and bilateral lymph node dissection on 02/09/2014. The final pathology showed prostate adenocarcinoma Gleason score 4+3 equals 7 involving both lobes.    Current therapy: Salvage therapy carfilzomib, daratumumab and dexamethasone started on July 28, 2020.  Travis Nguyen is here for the next cycle of therapy.  Interim History:  Travis Nguyen today for a follow-up visit.  Since last visit, Travis Nguyen reports no major changes in his health.  Travis Nguyen has tolerated the current therapy without any complaints.  Travis Nguyen denies any nausea, vomiting or abdominal pain.  Travis Nguyen denies any worsening neuropathy, infusion related complications or anorexia.  Travis Nguyen eats well although Travis Nguyen has lost some weight at times.     Medications: Updated on review. Current Outpatient Medications  Medication Sig Dispense Refill  . acyclovir (ZOVIRAX) 400 MG tablet TAKE 1 TABLET BY MOUTH DAILY. 90 tablet 3  . escitalopram (LEXAPRO) 10 MG tablet TAKE 1 TABLET BY MOUTH ONCE A DAY 90 tablet 1  . escitalopram (LEXAPRO) 10 MG tablet TAKE 1 TABLET BY MOUTH ONCE A DAY 30 tablet 1  . lidocaine-prilocaine (EMLA) cream APPLY TOPICALLY TO PORT-A-CATH DAILY AS NEEDED 30 g 2  . losartan (COZAAR) 100 MG tablet Take 100 mg by mouth every morning.     [name] losartan (COZAAR) 100 MG tablet TAKE 1 TABLET BY MOUTH  ONCE A DAY 90 tablet 0  . losartan (COZAAR) 100 MG tablet Take 1 tablet (100 mg total) by mouth daily 90 tablet 1  . losartan (COZAAR) 100 MG tablet Take  1 tablet by mouth Once a day 90 days 90 tablet 1  . oxyCODONE-acetaminophen (PERCOCET) 10-325 MG tablet Take 1 tablet by mouth as needed.  0  . oxyCODONE-acetaminophen (PERCOCET) 10-325 MG tablet TAKE 1 TABLET BY MOUTH 2 TIMES DAILY AS NEEDED DO NOT FILL UNTIL 09/21/20 60 tablet 0  . oxyCODONE-acetaminophen (PERCOCET) 10-325 MG tablet TAKE 1 TABLET BY MOUTH 2 TIMES DAILY AS NEEDED 60 tablet 0  . oxyCODONE-acetaminophen (PERCOCET) 10-325 MG tablet TAKE 1 TABLET BY MOUTH 2 TIMES DAILY AS NEEDED (07/27/20) 60 tablet 0  . oxyCODONE-acetaminophen (PERCOCET) 10-325 MG tablet TAKE 1 TABLET BY MOUTH 2 TIMES DAILY AS NEEDED FOR 30 DAYS (FILL 06/28/20) 60 tablet 0  . oxyCODONE-acetaminophen (PERCOCET) 10-325 MG tablet TAKE 1 TABLET BY MOUTH 2 TIMES DAILY IF NEEDED (30 DAY SUPPLY) 60 tablet 0  . oxyCODONE-acetaminophen (PERCOCET) 10-325 MG tablet TAKE 1 TABLET BY MOUTH DAILY TWO TIMES DAILY AS NEEDED FOR 30 DAY SUPPLY DO NOT FILL UNTIL 05/02/20 60 tablet 0  . oxyCODONE-acetaminophen (PERCOCET) 10-325 MG tablet Take 1 tablet by mouth 2 (two) times daily.  dnf until 10/20/20 60 tablet 0   No current facility-administered medications for this visit.     Allergies:  Allergies  Allergen Reactions  . Aspirin     Pt stated "raises BP"        Physical Exam:  Blood pressure (!) 174/88, pulse (!) 57, temperature (!) 96.3 F (35.7 C), temperature source Tympanic, resp. rate 18, weight 160 lb (72.6 kg), SpO2 100 %.    ECOG: 1   General appearance: Alert, awake without any distress. Head: Atraumatic without abnormalities Oropharynx: Without any thrush or ulcers. Eyes: No scleral icterus. Lymph nodes: No lymphadenopathy noted in the cervical, supraclavicular, or axillary nodes Heart:regular rate and rhythm, without any murmurs or gallops.   Lung:  Clear to auscultation without any rhonchi, wheezes or dullness to percussion. Abdomin: Soft, nontender without any shifting dullness or ascites. Musculoskeletal: No clubbing or cyanosis. Neurological: No motor or sensory deficits. Skin: No rashes or lesions.          Lab Results: Lab Results  Component Value Date   WBC 4.0 10/12/2020   HGB 10.5 (L) 10/12/2020   HCT 30.4 (L) 10/12/2020   MCV 101.3 (H) 10/12/2020   PLT 159 10/12/2020     Chemistry      Component Value Date/Time   NA 137 10/12/2020 1008   NA 138 03/27/2017 0838   K 4.3 10/12/2020 1008   K 4.3 03/27/2017 0838   CL 104 10/12/2020 1008   CL 105 01/01/2013 0835   CO2 25 10/12/2020 1008   CO2 24 03/27/2017 0838   BUN 16 10/12/2020 1008   BUN 23.7 03/27/2017 0838   CREATININE 0.79 10/12/2020 1008   CREATININE 1.2 03/27/2017 0838      Component Value Date/Time   CALCIUM 8.7 (L) 10/12/2020 1008   CALCIUM 9.9 03/27/2017 0838   ALKPHOS 70 10/12/2020 1008   ALKPHOS 79 03/27/2017 0838   AST 18 10/12/2020 1008   AST 25 03/27/2017 0838   ALT 22 10/12/2020 1008   ALT 26 03/27/2017 0838   BILITOT 0.6 10/12/2020 1008   BILITOT 0.31 03/27/2017 0838      Results for SARKIS, RHINES (MRN [id number]) as of 10/19/2020 09:29  Ref. Range 09/14/2020 09:23 10/12/2020 10:08  M Protein SerPl Elph-Mcnc Latest Ref Range: Not Observed g/dL 1.1 (H) 1.0 (H)  IFE 1 Unknown Comment (A) Comment (A)  Globulin, Total Latest Ref Range: 2.2 - 3.9 g/dL 3.0 3.1  B-Globulin SerPl Elph-Mcnc Latest Ref Range: 0.7 - 1.3 g/dL 0.9 1.0  IgG (Immunoglobin G), Serum Latest Ref Range: 603 - 1,613 mg/dL 1,418 1,258  IgM (Immunoglobulin M), Srm Latest Ref Range: 20 - 172 mg/dL 12 (L) 12 (L)  IgA Latest Ref Range: 61 - 437 mg/dL 16 (L) 16 (L)      Impression and Plan:  70 year old man with:   1.  IgG kappa multiple myeloma with relapsed disease in 2022 after initially diagnosed in 2014.    His disease status was updated at this time and  treatment options were reviewed.  Continues to tolerate the current therapy without any complications.  Risks and benefits of continuing this treatment were discussed.  Complications include neutropenia, infusion related toxicity, neuropathy among others were reviewed.  Protein studies obtained on October 12, 2020 continues to show decline in his M spike as well as normalization of his IgG level.   After discussion today, Travis Nguyen is agreeable to proceed with this treatment.  We will continue weekly treatment with intermittent week break pending his laboratory testing.  2.  Prostate cancer: Continues to be in remission at this time.   3.  IV access: Port-A-Cath remains in use without any issues.  4.  Antiemetics: No nausea or vomiting reported at this time.  Compazine is  available to him without any complications.  5.  VZV prophylaxis: No evidence of reactivation and Travis Nguyen continues to be on acyclovir.   6. Followup: Weekly for treatment and MD follow-up in 4 weeks.  30  minutes were spent on this visit.  The time was dedicated to reviewing his disease status, discussing treatment options and outlining future plan of care.    Zola Button, MD 4/7/20229:28 AM

## 2020-10-19 NOTE — Patient Instructions (Signed)
[facility] Discharge Instructions for Patients Receiving Chemotherapy  Today you received the following chemotherapy agents: Kyprolis/Darzalex Faspro.  To help prevent nausea and vomiting after your treatment, we encourage you to take your nausea medication as directed.   If you develop nausea and vomiting that is not controlled by your nausea medication, call the clinic.   BELOW ARE SYMPTOMS THAT SHOULD BE REPORTED IMMEDIATELY:  *FEVER GREATER THAN 100.5 F  *CHILLS WITH OR WITHOUT FEVER  NAUSEA AND VOMITING THAT IS NOT CONTROLLED WITH YOUR NAUSEA MEDICATION  *UNUSUAL SHORTNESS OF BREATH  *UNUSUAL BRUISING OR BLEEDING  TENDERNESS IN MOUTH AND THROAT WITH OR WITHOUT PRESENCE OF ULCERS  *URINARY PROBLEMS  *BOWEL PROBLEMS  UNUSUAL RASH Items with * indicate a potential emergency and should be followed up as soon as possible.  Feel free to call the clinic should you have any questions or concerns. The clinic phone number is [phone].  Please show the [facility] at check-in to the Emergency Department and triage nurse.

## 2020-10-20 ENCOUNTER — Other Ambulatory Visit (HOSPITAL_COMMUNITY): Payer: Self-pay

## 2020-10-26 ENCOUNTER — Inpatient Hospital Stay: Payer: Medicare Other

## 2020-10-26 ENCOUNTER — Other Ambulatory Visit: Payer: Self-pay

## 2020-10-26 VITALS — BP 147/95 | HR 59 | Temp 98.7°F | Resp 18 | Wt 161.2 lb

## 2020-10-26 DIAGNOSIS — C9001 Multiple myeloma in remission: Secondary | ICD-10-CM

## 2020-10-26 DIAGNOSIS — C9002 Multiple myeloma in relapse: Secondary | ICD-10-CM | POA: Diagnosis not present

## 2020-10-26 DIAGNOSIS — Z79899 Other long term (current) drug therapy: Secondary | ICD-10-CM | POA: Diagnosis not present

## 2020-10-26 DIAGNOSIS — Z5112 Encounter for antineoplastic immunotherapy: Secondary | ICD-10-CM | POA: Diagnosis not present

## 2020-10-26 LAB — CBC WITH DIFFERENTIAL (CANCER CENTER ONLY)
Abs Immature Granulocytes: 0.01 10*3/uL (ref 0.00–0.07)
Basophils Absolute: 0 10*3/uL (ref 0.0–0.1)
Basophils Relative: 0 %
Eosinophils Absolute: 0 10*3/uL (ref 0.0–0.5)
Eosinophils Relative: 1 %
HCT: 32.2 % — ABNORMAL LOW (ref 39.0–52.0)
Hemoglobin: 10.9 g/dL — ABNORMAL LOW (ref 13.0–17.0)
Immature Granulocytes: 0 %
Lymphocytes Relative: 32 %
Lymphs Abs: 1.2 10*3/uL (ref 0.7–4.0)
MCH: 34.9 pg — ABNORMAL HIGH (ref 26.0–34.0)
MCHC: 33.9 g/dL (ref 30.0–36.0)
MCV: 103.2 fL — ABNORMAL HIGH (ref 80.0–100.0)
Monocytes Absolute: 0.4 10*3/uL (ref 0.1–1.0)
Monocytes Relative: 12 %
Neutro Abs: 2.1 10*3/uL (ref 1.7–7.7)
Neutrophils Relative %: 55 %
Platelet Count: 164 10*3/uL (ref 150–400)
RBC: 3.12 MIL/uL — ABNORMAL LOW (ref 4.22–5.81)
RDW: 12.8 % (ref 11.5–15.5)
WBC Count: 3.7 10*3/uL — ABNORMAL LOW (ref 4.0–10.5)
nRBC: 0 % (ref 0.0–0.2)

## 2020-10-26 LAB — CMP (CANCER CENTER ONLY)
ALT: 30 U/L (ref 0–44)
AST: 16 U/L (ref 15–41)
Albumin: 4 g/dL (ref 3.5–5.0)
Alkaline Phosphatase: 80 U/L (ref 38–126)
Anion gap: 8 (ref 5–15)
BUN: 14 mg/dL (ref 8–23)
CO2: 26 mmol/L (ref 22–32)
Calcium: 9.1 mg/dL (ref 8.9–10.3)
Chloride: 103 mmol/L (ref 98–111)
Creatinine: 0.81 mg/dL (ref 0.61–1.24)
GFR, Estimated: >60 mL/min (ref >60)
Glucose, Bld: 95 mg/dL (ref 70–99)
Potassium: 4.4 mmol/L (ref 3.5–5.1)
Sodium: 137 mmol/L (ref 135–145)
Total Bilirubin: 0.5 mg/dL (ref 0.3–1.2)
Total Protein: 7.3 g/dL (ref 6.5–8.1)

## 2020-10-26 MED ORDER — DARATUMUMAB-HYALURONIDASE-FIHJ 1800-30000 MG-UT/15ML ~~LOC~~ SOLN
1800.0000 mg | Freq: Once | SUBCUTANEOUS | Status: AC
  Administered 2020-10-26: 1800 mg via SUBCUTANEOUS
  Filled 2020-10-26: qty 15

## 2020-10-26 MED ORDER — DIPHENHYDRAMINE HCL 25 MG PO CAPS
50.0000 mg | ORAL_CAPSULE | Freq: Once | ORAL | Status: AC
  Administered 2020-10-26: 50 mg via ORAL

## 2020-10-26 MED ORDER — DIPHENHYDRAMINE HCL 25 MG PO CAPS
ORAL_CAPSULE | ORAL | Status: AC
  Filled 2020-10-26: qty 2

## 2020-10-26 MED ORDER — ACETAMINOPHEN 325 MG PO TABS
ORAL_TABLET | ORAL | Status: AC
  Filled 2020-10-26: qty 2

## 2020-10-26 MED ORDER — SODIUM CHLORIDE 0.9% FLUSH
10.0000 mL | INTRAVENOUS | Status: DC | PRN
  Administered 2020-10-26: 10 mL
  Filled 2020-10-26: qty 10

## 2020-10-26 MED ORDER — SODIUM CHLORIDE 0.9 % IV SOLN
Freq: Once | INTRAVENOUS | Status: AC
  Filled 2020-10-26: qty 250

## 2020-10-26 MED ORDER — SODIUM CHLORIDE 0.9 % IV SOLN
40.0000 mg | Freq: Once | INTRAVENOUS | Status: AC
  Administered 2020-10-26: 40 mg via INTRAVENOUS
  Filled 2020-10-26: qty 4

## 2020-10-26 MED ORDER — HEPARIN SOD (PORK) LOCK FLUSH 100 UNIT/ML IV SOLN
500.0000 [IU] | Freq: Once | INTRAVENOUS | Status: AC | PRN
  Administered 2020-10-26: 500 [IU]
  Filled 2020-10-26: qty 5

## 2020-10-26 MED ORDER — DEXTROSE 5 % IV SOLN
56.0000 mg/m2 | Freq: Once | INTRAVENOUS | Status: AC
  Administered 2020-10-26: 110 mg via INTRAVENOUS
  Filled 2020-10-26: qty 15

## 2020-10-26 MED ORDER — ACETAMINOPHEN 325 MG PO TABS
650.0000 mg | ORAL_TABLET | Freq: Once | ORAL | Status: AC
  Administered 2020-10-26: 650 mg via ORAL

## 2020-10-26 NOTE — Patient Instructions (Signed)
[facility] Discharge Instructions for Patients Receiving Chemotherapy  Today you received the following chemotherapy agents Kyprolis; Darzalex Faspro  To help prevent nausea and vomiting after your treatment, we encourage you to take your nausea medication as prescribed   If you develop nausea and vomiting that is not controlled by your nausea medication, call the clinic.   BELOW ARE SYMPTOMS THAT SHOULD BE REPORTED IMMEDIATELY:  *FEVER GREATER THAN 100.5 F  *CHILLS WITH OR WITHOUT FEVER  NAUSEA AND VOMITING THAT IS NOT CONTROLLED WITH YOUR NAUSEA MEDICATION  *UNUSUAL SHORTNESS OF BREATH  *UNUSUAL BRUISING OR BLEEDING  TENDERNESS IN MOUTH AND THROAT WITH OR WITHOUT PRESENCE OF ULCERS  *URINARY PROBLEMS  *BOWEL PROBLEMS  UNUSUAL RASH Items with * indicate a potential emergency and should be followed up as soon as possible.  Feel free to call the clinic should you have any questions or concerns. The clinic phone number is [phone].  Please show the [facility] at check-in to the Emergency Department and triage nurse.

## 2020-11-02 ENCOUNTER — Inpatient Hospital Stay: Payer: Medicare Other

## 2020-11-02 ENCOUNTER — Other Ambulatory Visit: Payer: Self-pay

## 2020-11-02 ENCOUNTER — Other Ambulatory Visit: Payer: Self-pay | Admitting: Oncology

## 2020-11-02 VITALS — BP 170/90 | HR 62 | Temp 97.8°F | Resp 18

## 2020-11-02 DIAGNOSIS — C61 Malignant neoplasm of prostate: Secondary | ICD-10-CM

## 2020-11-02 DIAGNOSIS — Z95828 Presence of other vascular implants and grafts: Secondary | ICD-10-CM

## 2020-11-02 DIAGNOSIS — C9002 Multiple myeloma in relapse: Secondary | ICD-10-CM | POA: Diagnosis not present

## 2020-11-02 DIAGNOSIS — Z79899 Other long term (current) drug therapy: Secondary | ICD-10-CM | POA: Diagnosis not present

## 2020-11-02 DIAGNOSIS — Z5112 Encounter for antineoplastic immunotherapy: Secondary | ICD-10-CM | POA: Diagnosis not present

## 2020-11-02 DIAGNOSIS — C9001 Multiple myeloma in remission: Secondary | ICD-10-CM

## 2020-11-02 LAB — CBC WITH DIFFERENTIAL (CANCER CENTER ONLY)
Abs Immature Granulocytes: 0.02 10*3/uL (ref 0.00–0.07)
Basophils Absolute: 0 10*3/uL (ref 0.0–0.1)
Basophils Relative: 0 %
Eosinophils Absolute: 0 10*3/uL (ref 0.0–0.5)
Eosinophils Relative: 1 %
HCT: 32.8 % — ABNORMAL LOW (ref 39.0–52.0)
Hemoglobin: 11.2 g/dL — ABNORMAL LOW (ref 13.0–17.0)
Immature Granulocytes: 1 %
Lymphocytes Relative: 32 %
Lymphs Abs: 1.4 10*3/uL (ref 0.7–4.0)
MCH: 35.2 pg — ABNORMAL HIGH (ref 26.0–34.0)
MCHC: 34.1 g/dL (ref 30.0–36.0)
MCV: 103.1 fL — ABNORMAL HIGH (ref 80.0–100.0)
Monocytes Absolute: 0.5 10*3/uL (ref 0.1–1.0)
Monocytes Relative: 11 %
Neutro Abs: 2.3 10*3/uL (ref 1.7–7.7)
Neutrophils Relative %: 55 %
Platelet Count: 157 10*3/uL (ref 150–400)
RBC: 3.18 MIL/uL — ABNORMAL LOW (ref 4.22–5.81)
RDW: 12.8 % (ref 11.5–15.5)
WBC Count: 4.2 10*3/uL (ref 4.0–10.5)
nRBC: 0 % (ref 0.0–0.2)

## 2020-11-02 LAB — CMP (CANCER CENTER ONLY)
ALT: 21 U/L (ref 0–44)
AST: 20 U/L (ref 15–41)
Albumin: 4.1 g/dL (ref 3.5–5.0)
Alkaline Phosphatase: 73 U/L (ref 38–126)
Anion gap: 8 (ref 5–15)
BUN: 16 mg/dL (ref 8–23)
CO2: 24 mmol/L (ref 22–32)
Calcium: 9.1 mg/dL (ref 8.9–10.3)
Chloride: 105 mmol/L (ref 98–111)
Creatinine: 0.82 mg/dL (ref 0.61–1.24)
GFR, Estimated: >60 mL/min (ref >60)
Glucose, Bld: 95 mg/dL (ref 70–99)
Potassium: 4.7 mmol/L (ref 3.5–5.1)
Sodium: 137 mmol/L (ref 135–145)
Total Bilirubin: 0.5 mg/dL (ref 0.3–1.2)
Total Protein: 7.5 g/dL (ref 6.5–8.1)

## 2020-11-02 MED ORDER — HEPARIN SOD (PORK) LOCK FLUSH 100 UNIT/ML IV SOLN
500.0000 [IU] | Freq: Once | INTRAVENOUS | Status: AC | PRN
  Administered 2020-11-02: 500 [IU]
  Filled 2020-11-02: qty 5

## 2020-11-02 MED ORDER — SODIUM CHLORIDE 0.9 % IV SOLN
40.0000 mg | Freq: Once | INTRAVENOUS | Status: AC
  Administered 2020-11-02: 40 mg via INTRAVENOUS
  Filled 2020-11-02: qty 4

## 2020-11-02 MED ORDER — SODIUM CHLORIDE 0.9% FLUSH
10.0000 mL | INTRAVENOUS | Status: DC | PRN
  Administered 2020-11-02: 10 mL
  Filled 2020-11-02: qty 10

## 2020-11-02 MED ORDER — DEXTROSE 5 % IV SOLN
56.0000 mg/m2 | Freq: Once | INTRAVENOUS | Status: AC
  Administered 2020-11-02: 110 mg via INTRAVENOUS
  Filled 2020-11-02: qty 30

## 2020-11-02 MED ORDER — SODIUM CHLORIDE 0.9 % IV SOLN
Freq: Once | INTRAVENOUS | Status: AC
Start: 2020-11-02 — End: 2020-11-02
  Filled 2020-11-02: qty 250

## 2020-11-02 NOTE — Patient Instructions (Signed)
[facility] Discharge Instructions for Patients Receiving Chemotherapy  Today you received the following chemotherapy agents:: Carfilzomib (Kyprolis)  To help prevent nausea and vomiting after your treatment, we encourage you to take your nausea medication  as prescribed.    If you develop nausea and vomiting that is not controlled by your nausea medication, call the clinic.   BELOW ARE SYMPTOMS THAT SHOULD BE REPORTED IMMEDIATELY:  *FEVER GREATER THAN 100.5 F  *CHILLS WITH OR WITHOUT FEVER  NAUSEA AND VOMITING THAT IS NOT CONTROLLED WITH YOUR NAUSEA MEDICATION  *UNUSUAL SHORTNESS OF BREATH  *UNUSUAL BRUISING OR BLEEDING  TENDERNESS IN MOUTH AND THROAT WITH OR WITHOUT PRESENCE OF ULCERS  *URINARY PROBLEMS  *BOWEL PROBLEMS  UNUSUAL RASH Items with * indicate a potential emergency and should be followed up as soon as possible.  Feel free to call the clinic should you have any questions or concerns. The clinic phone number is [phone].  Please show the [facility] at check-in to the Emergency Department and triage nurse.

## 2020-11-09 ENCOUNTER — Inpatient Hospital Stay: Payer: Medicare Other

## 2020-11-09 ENCOUNTER — Other Ambulatory Visit: Payer: Self-pay

## 2020-11-09 ENCOUNTER — Other Ambulatory Visit: Payer: Medicare Other

## 2020-11-09 VITALS — BP 170/84 | HR 64 | Temp 98.6°F | Resp 16 | Wt 162.0 lb

## 2020-11-09 DIAGNOSIS — C9001 Multiple myeloma in remission: Secondary | ICD-10-CM

## 2020-11-09 DIAGNOSIS — C9002 Multiple myeloma in relapse: Secondary | ICD-10-CM | POA: Diagnosis not present

## 2020-11-09 DIAGNOSIS — Z95828 Presence of other vascular implants and grafts: Secondary | ICD-10-CM

## 2020-11-09 DIAGNOSIS — Z5112 Encounter for antineoplastic immunotherapy: Secondary | ICD-10-CM | POA: Diagnosis not present

## 2020-11-09 DIAGNOSIS — Z79899 Other long term (current) drug therapy: Secondary | ICD-10-CM | POA: Diagnosis not present

## 2020-11-09 DIAGNOSIS — C61 Malignant neoplasm of prostate: Secondary | ICD-10-CM

## 2020-11-09 LAB — CBC WITH DIFFERENTIAL (CANCER CENTER ONLY)
Abs Immature Granulocytes: 0.01 10*3/uL (ref 0.00–0.07)
Basophils Absolute: 0 10*3/uL (ref 0.0–0.1)
Basophils Relative: 1 %
Eosinophils Absolute: 0.1 10*3/uL (ref 0.0–0.5)
Eosinophils Relative: 2 %
HCT: 32.3 % — ABNORMAL LOW (ref 39.0–52.0)
Hemoglobin: 11 g/dL — ABNORMAL LOW (ref 13.0–17.0)
Immature Granulocytes: 0 %
Lymphocytes Relative: 32 %
Lymphs Abs: 1.3 10*3/uL (ref 0.7–4.0)
MCH: 34.9 pg — ABNORMAL HIGH (ref 26.0–34.0)
MCHC: 34.1 g/dL (ref 30.0–36.0)
MCV: 102.5 fL — ABNORMAL HIGH (ref 80.0–100.0)
Monocytes Absolute: 0.4 10*3/uL (ref 0.1–1.0)
Monocytes Relative: 10 %
Neutro Abs: 2.4 10*3/uL (ref 1.7–7.7)
Neutrophils Relative %: 55 %
Platelet Count: 182 10*3/uL (ref 150–400)
RBC: 3.15 MIL/uL — ABNORMAL LOW (ref 4.22–5.81)
RDW: 12.8 % (ref 11.5–15.5)
WBC Count: 4.2 10*3/uL (ref 4.0–10.5)
nRBC: 0 % (ref 0.0–0.2)

## 2020-11-09 LAB — CMP (CANCER CENTER ONLY)
ALT: 21 U/L (ref 0–44)
AST: 18 U/L (ref 15–41)
Albumin: 3.9 g/dL (ref 3.5–5.0)
Alkaline Phosphatase: 70 U/L (ref 38–126)
Anion gap: 9 (ref 5–15)
BUN: 15 mg/dL (ref 8–23)
CO2: 23 mmol/L (ref 22–32)
Calcium: 9 mg/dL (ref 8.9–10.3)
Chloride: 105 mmol/L (ref 98–111)
Creatinine: 0.8 mg/dL (ref 0.61–1.24)
GFR, Estimated: >60 mL/min (ref >60)
Glucose, Bld: 83 mg/dL (ref 70–99)
Potassium: 4.1 mmol/L (ref 3.5–5.1)
Sodium: 137 mmol/L (ref 135–145)
Total Bilirubin: 0.4 mg/dL (ref 0.3–1.2)
Total Protein: 7.3 g/dL (ref 6.5–8.1)

## 2020-11-09 MED ORDER — DARATUMUMAB-HYALURONIDASE-FIHJ 1800-30000 MG-UT/15ML ~~LOC~~ SOLN
1800.0000 mg | Freq: Once | SUBCUTANEOUS | Status: AC
  Administered 2020-11-09: 1800 mg via SUBCUTANEOUS
  Filled 2020-11-09: qty 15

## 2020-11-09 MED ORDER — HEPARIN SOD (PORK) LOCK FLUSH 100 UNIT/ML IV SOLN
500.0000 [IU] | Freq: Once | INTRAVENOUS | Status: AC | PRN
  Administered 2020-11-09: 500 [IU]
  Filled 2020-11-09: qty 5

## 2020-11-09 MED ORDER — DEXTROSE 5 % IV SOLN
56.0000 mg/m2 | Freq: Once | INTRAVENOUS | Status: AC
  Administered 2020-11-09: 110 mg via INTRAVENOUS
  Filled 2020-11-09: qty 30

## 2020-11-09 MED ORDER — SODIUM CHLORIDE 0.9 % IV SOLN
Freq: Once | INTRAVENOUS | Status: AC
  Filled 2020-11-09: qty 250

## 2020-11-09 MED ORDER — ACETAMINOPHEN 325 MG PO TABS
ORAL_TABLET | ORAL | Status: AC
  Filled 2020-11-09: qty 2

## 2020-11-09 MED ORDER — DIPHENHYDRAMINE HCL 25 MG PO CAPS
ORAL_CAPSULE | ORAL | Status: AC
  Filled 2020-11-09: qty 2

## 2020-11-09 MED ORDER — SODIUM CHLORIDE 0.9 % IV SOLN
40.0000 mg | Freq: Once | INTRAVENOUS | Status: AC
  Administered 2020-11-09: 40 mg via INTRAVENOUS
  Filled 2020-11-09: qty 4

## 2020-11-09 MED ORDER — SODIUM CHLORIDE 0.9% FLUSH
10.0000 mL | INTRAVENOUS | Status: DC | PRN
  Filled 2020-11-09: qty 10

## 2020-11-09 MED ORDER — DIPHENHYDRAMINE HCL 25 MG PO CAPS
50.0000 mg | ORAL_CAPSULE | Freq: Once | ORAL | Status: AC
  Administered 2020-11-09: 50 mg via ORAL

## 2020-11-09 MED ORDER — SODIUM CHLORIDE 0.9% FLUSH
10.0000 mL | INTRAVENOUS | Status: DC | PRN
  Administered 2020-11-09: 10 mL
  Filled 2020-11-09: qty 10

## 2020-11-09 MED ORDER — ACETAMINOPHEN 325 MG PO TABS
650.0000 mg | ORAL_TABLET | Freq: Once | ORAL | Status: AC
  Administered 2020-11-09: 650 mg via ORAL

## 2020-11-09 MED ORDER — SODIUM CHLORIDE 0.9 % IV SOLN
Freq: Once | INTRAVENOUS | Status: DC
  Filled 2020-11-09: qty 250

## 2020-11-09 NOTE — Patient Instructions (Signed)

## 2020-11-09 NOTE — Patient Instructions (Signed)
[facility]  Discharge Instructions: Thank you for choosing [facility] to provide your oncology and hematology care.   If you have a lab appointment with the Cancer Center, please go directly to the Cancer Center and check in at the registration area.   Wear comfortable clothing and clothing appropriate for easy access to any Portacath or PICC line.   We strive to give you quality time with your provider. You may need to reschedule your appointment if you arrive late (15 or more minutes).  Arriving late affects you and other patients whose appointments are after yours.  Also, if you miss three or more appointments without notifying the office, you may be dismissed from the clinic at the provider's discretion.      For prescription refill requests, have your pharmacy contact our office and allow 72 hours for refills to be completed.    Today you received the following chemotherapy and/or immunotherapy agents Kyprolis & Darzalex      To help prevent nausea and vomiting after your treatment, we encourage you to take your nausea medication as directed.  BELOW ARE SYMPTOMS THAT SHOULD BE REPORTED IMMEDIATELY: *FEVER GREATER THAN 100.4 F (38 C) OR HIGHER *CHILLS OR SWEATING *NAUSEA AND VOMITING THAT IS NOT CONTROLLED WITH YOUR NAUSEA MEDICATION *UNUSUAL SHORTNESS OF BREATH *UNUSUAL BRUISING OR BLEEDING *URINARY PROBLEMS (pain or burning when urinating, or frequent urination) *BOWEL PROBLEMS (unusual diarrhea, constipation, pain near the anus) TENDERNESS IN MOUTH AND THROAT WITH OR WITHOUT PRESENCE OF ULCERS (sore throat, sores in mouth, or a toothache) UNUSUAL RASH, SWELLING OR PAIN  UNUSUAL VAGINAL DISCHARGE OR ITCHING   Items with * indicate a potential emergency and should be followed up as soon as possible or go to the Emergency Department if any problems should occur.  Please show the CHEMOTHERAPY ALERT CARD or IMMUNOTHERAPY ALERT CARD at  check-in to the Emergency Department and triage nurse.  Should you have questions after your visit or need to cancel or reschedule your appointment, please contact [facility]  Dept: [phone]  and follow the prompts.  Office hours are 8:00 a.m. to 4:30 p.m. Monday - Friday. Please note that voicemails left after 4:00 p.m. may not be returned until the following business day.  We are closed weekends and major holidays. You have access to a nurse at all times for urgent questions. Please call the main number to the clinic Dept: [phone] and follow the prompts.   For any non-urgent questions, you may also contact your provider using MyChart. We now offer e-Visits for anyone 18 and older to request care online for non-urgent symptoms. For details visit [email or web address].   Also download the MyChart app! Go to the app store, search "MyChart", open the app, select [facility], and log in with your MyChart username and password.  Due to Covid, a mask is required upon entering the hospital/clinic. If you do not have a mask, one will be given to you upon arrival. For doctor visits, patients may have 1 support person aged 18 or older with them. For treatment visits, patients cannot have anyone with them due to current Covid guidelines and our immunocompromised population.   

## 2020-11-15 ENCOUNTER — Other Ambulatory Visit (HOSPITAL_COMMUNITY): Payer: Self-pay

## 2020-11-16 ENCOUNTER — Other Ambulatory Visit (HOSPITAL_COMMUNITY): Payer: Self-pay

## 2020-11-16 MED ORDER — OXYCODONE-ACETAMINOPHEN 10-325 MG PO TABS
ORAL_TABLET | ORAL | 0 refills | Status: DC
  Filled 2020-11-16 – 2020-11-18 (×2): qty 60, 30d supply, fill #0

## 2020-11-17 ENCOUNTER — Other Ambulatory Visit: Payer: Self-pay

## 2020-11-17 ENCOUNTER — Inpatient Hospital Stay (HOSPITAL_BASED_OUTPATIENT_CLINIC_OR_DEPARTMENT_OTHER): Payer: Medicare Other | Admitting: Oncology

## 2020-11-17 ENCOUNTER — Inpatient Hospital Stay: Payer: Medicare Other | Attending: Oncology

## 2020-11-17 ENCOUNTER — Inpatient Hospital Stay: Payer: Medicare Other

## 2020-11-17 ENCOUNTER — Other Ambulatory Visit: Payer: Medicare Other

## 2020-11-17 VITALS — BP 162/97 | HR 58

## 2020-11-17 VITALS — BP 167/101 | HR 66 | Temp 98.0°F | Resp 19 | Ht 73.0 in | Wt 162.8 lb

## 2020-11-17 DIAGNOSIS — C61 Malignant neoplasm of prostate: Secondary | ICD-10-CM

## 2020-11-17 DIAGNOSIS — Z5112 Encounter for antineoplastic immunotherapy: Secondary | ICD-10-CM | POA: Diagnosis not present

## 2020-11-17 DIAGNOSIS — Z79899 Other long term (current) drug therapy: Secondary | ICD-10-CM | POA: Diagnosis not present

## 2020-11-17 DIAGNOSIS — C9002 Multiple myeloma in relapse: Secondary | ICD-10-CM | POA: Insufficient documentation

## 2020-11-17 DIAGNOSIS — G629 Polyneuropathy, unspecified: Secondary | ICD-10-CM | POA: Diagnosis not present

## 2020-11-17 DIAGNOSIS — Z8546 Personal history of malignant neoplasm of prostate: Secondary | ICD-10-CM | POA: Insufficient documentation

## 2020-11-17 DIAGNOSIS — C9001 Multiple myeloma in remission: Secondary | ICD-10-CM

## 2020-11-17 DIAGNOSIS — Z95828 Presence of other vascular implants and grafts: Secondary | ICD-10-CM

## 2020-11-17 LAB — CBC WITH DIFFERENTIAL (CANCER CENTER ONLY)
Abs Immature Granulocytes: 0.01 10*3/uL (ref 0.00–0.07)
Basophils Absolute: 0 10*3/uL (ref 0.0–0.1)
Basophils Relative: 0 %
Eosinophils Absolute: 0.1 10*3/uL (ref 0.0–0.5)
Eosinophils Relative: 3 %
HCT: 34.4 % — ABNORMAL LOW (ref 39.0–52.0)
Hemoglobin: 11.5 g/dL — ABNORMAL LOW (ref 13.0–17.0)
Immature Granulocytes: 0 %
Lymphocytes Relative: 30 %
Lymphs Abs: 1.4 10*3/uL (ref 0.7–4.0)
MCH: 34.5 pg — ABNORMAL HIGH (ref 26.0–34.0)
MCHC: 33.4 g/dL (ref 30.0–36.0)
MCV: 103.3 fL — ABNORMAL HIGH (ref 80.0–100.0)
Monocytes Absolute: 0.5 10*3/uL (ref 0.1–1.0)
Monocytes Relative: 10 %
Neutro Abs: 2.6 10*3/uL (ref 1.7–7.7)
Neutrophils Relative %: 57 %
Platelet Count: 192 10*3/uL (ref 150–400)
RBC: 3.33 MIL/uL — ABNORMAL LOW (ref 4.22–5.81)
RDW: 12.5 % (ref 11.5–15.5)
WBC Count: 4.7 10*3/uL (ref 4.0–10.5)
nRBC: 0 % (ref 0.0–0.2)

## 2020-11-17 LAB — CMP (CANCER CENTER ONLY)
ALT: 27 U/L (ref 0–44)
AST: 20 U/L (ref 15–41)
Albumin: 4.1 g/dL (ref 3.5–5.0)
Alkaline Phosphatase: 67 U/L (ref 38–126)
Anion gap: 8 (ref 5–15)
BUN: 13 mg/dL (ref 8–23)
CO2: 27 mmol/L (ref 22–32)
Calcium: 9.4 mg/dL (ref 8.9–10.3)
Chloride: 102 mmol/L (ref 98–111)
Creatinine: 0.88 mg/dL (ref 0.61–1.24)
GFR, Estimated: >60 mL/min (ref >60)
Glucose, Bld: 93 mg/dL (ref 70–99)
Potassium: 4.6 mmol/L (ref 3.5–5.1)
Sodium: 137 mmol/L (ref 135–145)
Total Bilirubin: 0.5 mg/dL (ref 0.3–1.2)
Total Protein: 7.5 g/dL (ref 6.5–8.1)

## 2020-11-17 MED ORDER — SODIUM CHLORIDE 0.9% FLUSH
10.0000 mL | INTRAVENOUS | Status: DC | PRN
  Administered 2020-11-17: 10 mL
  Filled 2020-11-17: qty 10

## 2020-11-17 MED ORDER — DEXTROSE 5 % IV SOLN
56.0000 mg/m2 | Freq: Once | INTRAVENOUS | Status: AC
  Administered 2020-11-17: 110 mg via INTRAVENOUS
  Filled 2020-11-17: qty 30

## 2020-11-17 MED ORDER — SODIUM CHLORIDE 0.9 % IV SOLN
40.0000 mg | Freq: Once | INTRAVENOUS | Status: AC
  Administered 2020-11-17: 40 mg via INTRAVENOUS
  Filled 2020-11-17: qty 4

## 2020-11-17 MED ORDER — HEPARIN SOD (PORK) LOCK FLUSH 100 UNIT/ML IV SOLN
500.0000 [IU] | Freq: Once | INTRAVENOUS | Status: AC | PRN
Start: 2020-11-17 — End: 2020-11-17
  Administered 2020-11-17: 500 [IU]
  Filled 2020-11-17: qty 5

## 2020-11-17 MED ORDER — SODIUM CHLORIDE 0.9 % IV SOLN
Freq: Once | INTRAVENOUS | Status: AC
  Filled 2020-11-17: qty 250

## 2020-11-17 NOTE — Addendum Note (Signed)
Addended by: Wyatt Portela on: 11/17/2020 12:35 PM   Modules accepted: Orders

## 2020-11-17 NOTE — Progress Notes (Signed)
Hematology and Oncology Follow Up Visit  Travis Nguyen [id number] [birth year] 70 y.o. 11/17/2020 11:29 AM Travis Nguyen, Travis Brow, MD   Principle Diagnosis: 70 year old man with relapsed multiple myeloma noted in January 2022.  He was found to have IgG kappa diagnosed in 2014.     Secondary diagnosis: Stage a T1c, Gleason score 3+4 = 7 prostate cancer that is currently in remission.  Prior Therapy: He was treated initially with Cytoxan, Velcade and Decadron and subsequently his regimen changed to a Velcade, Revlimid with dexamethasone.  He achieved remission at that time.   He is status post a robotic-assisted laparoscopic radical prostatectomy and bilateral lymph node dissection on 02/09/2014. The final pathology showed prostate adenocarcinoma Gleason score 4+3 equals 7 involving both lobes.    Current therapy: Salvage therapy carfilzomib, and dexamethasone weekly started on July 28, 2020.  He is receiving daratumumab per protocol and currently every 2 weeks.  He is here for the next cycle of therapy.  Interim History:  Travis Nguyen returns today for repeat evaluation.  Since the last visit, he reports no major changes in his health.  He continues to tolerate weekly therapy without any complications.  He denies any nausea, vomiting or abdominal pain.  Denies any worsening neuropathy or palpitation.  His performance status and quality of life remained excellent.     Medications: Unchanged on review. Current Outpatient Medications  Medication Sig Dispense Refill  . acyclovir (ZOVIRAX) 400 MG tablet TAKE 1 TABLET BY MOUTH DAILY. 90 tablet 3  . escitalopram (LEXAPRO) 10 MG tablet TAKE 1 TABLET BY MOUTH ONCE A DAY 90 tablet 1  . escitalopram (LEXAPRO) 10 MG tablet TAKE 1 TABLET BY MOUTH ONCE A DAY 30 tablet 1  . lidocaine-prilocaine (EMLA) cream APPLY TOPICALLY TO PORT-A-CATH DAILY AS NEEDED 30 g 2  . losartan (COZAAR) 100 MG tablet Take 100 mg by mouth every morning.     [name]  losartan (COZAAR) 100 MG tablet TAKE 1 TABLET BY MOUTH ONCE A DAY 90 tablet 0  . losartan (COZAAR) 100 MG tablet Take 1 tablet (100 mg total) by mouth daily 90 tablet 1  . losartan (COZAAR) 100 MG tablet Take  1 tablet by mouth Once a day 90 days 90 tablet 1  . oxyCODONE-acetaminophen (PERCOCET) 10-325 MG tablet Take 1 tablet by mouth as needed.  0  . oxyCODONE-acetaminophen (PERCOCET) 10-325 MG tablet TAKE 1 TABLET BY MOUTH 2 TIMES DAILY AS NEEDED DO NOT FILL UNTIL 09/21/20 60 tablet 0  . oxyCODONE-acetaminophen (PERCOCET) 10-325 MG tablet TAKE 1 TABLET BY MOUTH 2 TIMES DAILY AS NEEDED 60 tablet 0  . oxyCODONE-acetaminophen (PERCOCET) 10-325 MG tablet TAKE 1 TABLET BY MOUTH 2 TIMES DAILY AS NEEDED (07/27/20) 60 tablet 0  . oxyCODONE-acetaminophen (PERCOCET) 10-325 MG tablet TAKE 1 TABLET BY MOUTH 2 TIMES DAILY AS NEEDED FOR 30 DAYS (FILL 06/28/20) 60 tablet 0  . oxyCODONE-acetaminophen (PERCOCET) 10-325 MG tablet TAKE 1 TABLET BY MOUTH 2 TIMES DAILY IF NEEDED (30 DAY SUPPLY) 60 tablet 0  . oxyCODONE-acetaminophen (PERCOCET) 10-325 MG tablet TAKE 1 TABLET BY MOUTH TWICE DAILY AS NEEDED (30 DAY SUPPLY) (11/18/20) 60 tablet 0   No current facility-administered medications for this visit.     Allergies:  Allergies  Allergen Reactions  . Aspirin     Pt stated "raises BP"        Physical Exam:   Blood pressure (!) 167/101, pulse 66, temperature 98 F (36.7 C), temperature source Tympanic, resp. rate 19, height 6'  1" (1.854 m), weight 162 lb 12.8 oz (73.8 kg), SpO2 100 %.    ECOG: 1    General appearance: Comfortable appearing without any discomfort Head: Normocephalic without any trauma Oropharynx: Mucous membranes are moist and pink without any thrush or ulcers. Eyes: Pupils are equal and round reactive to light. Lymph nodes: No cervical, supraclavicular, inguinal or axillary lymphadenopathy.   Heart:regular rate and rhythm.  S1 and S2 without leg edema. Lung: Clear without any  rhonchi or wheezes.  No dullness to percussion. Abdomin: Soft, nontender, nondistended with good bowel sounds.  No hepatosplenomegaly. Musculoskeletal: No joint deformity or effusion.  Full range of motion noted. Neurological: No deficits noted on motor, sensory and deep tendon reflex exam. Skin: No petechial rash or dryness.  Appeared moist.            Lab Results: Lab Results  Component Value Date   WBC 4.2 11/09/2020   HGB 11.0 (L) 11/09/2020   HCT 32.3 (L) 11/09/2020   MCV 102.5 (H) 11/09/2020   PLT 182 11/09/2020     Chemistry      Component Value Date/Time   NA 137 11/09/2020 1159   NA 138 03/27/2017 0838   K 4.1 11/09/2020 1159   K 4.3 03/27/2017 0838   CL 105 11/09/2020 1159   CL 105 01/01/2013 0835   CO2 23 11/09/2020 1159   CO2 24 03/27/2017 0838   BUN 15 11/09/2020 1159   BUN 23.7 03/27/2017 0838   CREATININE 0.80 11/09/2020 1159   CREATININE 1.2 03/27/2017 0838      Component Value Date/Time   CALCIUM 9.0 11/09/2020 1159   CALCIUM 9.9 03/27/2017 0838   ALKPHOS 70 11/09/2020 1159   ALKPHOS 79 03/27/2017 0838   AST 18 11/09/2020 1159   AST 25 03/27/2017 0838   ALT 21 11/09/2020 1159   ALT 26 03/27/2017 0838   BILITOT 0.4 11/09/2020 1159   BILITOT 0.31 03/27/2017 0838         Results for TION, TSE (MRN [id number]) as of 11/17/2020 11:31  Ref. Range 08/17/2020 13:21 09/14/2020 09:23 10/12/2020 10:08  Gamma Glob SerPl Elph-Mcnc Latest Ref Range: 0.4 - 1.8 g/dL 1.7 1.3 1.2  M Protein SerPl Elph-Mcnc Latest Ref Range: Not Observed g/dL 1.4 (H) 1.1 (H) 1.0 (H)  IFE 1 Unknown Comment (A) Comment (A) Comment (A)  Globulin, Total Latest Ref Range: 2.2 - 3.9 g/dL 3.5 3.0 3.1  B-Globulin SerPl Elph-Mcnc Latest Ref Range: 0.7 - 1.3 g/dL 0.7 0.9 1.0  IgG (Immunoglobin G), Serum Latest Ref Range: 603 - 1,613 mg/dL 1,808 (H) 1,418 1,258  IgM (Immunoglobulin M), Srm Latest Ref Range: 20 - 172 mg/dL 16 (L) 12 (L) 12 (L)  IgA Latest Ref Range: 61 - 437  mg/dL 21 (L) 16 (L) 16 (L)    Impression and Plan:  70 year old man with:   1.  Relapsed IgG kappa multiple myeloma diagnosed in February 2022 after initially diagnosed in 2014.  His disease status was updated at this time and treatment options were reviewed.  He continues to tolerate the current regimen without any major complaints.  Risks and benefits of continuing this treatment were discussed.  Complications include nausea, vomiting, mild suppression and worsening neuropathy he is agreeable to proceed at this time.  We will update his protein studies from today.  He did have a slight decline at the beginning of his treatment of his M spike.  Different salvage therapy option could be utilized in the future utilizing  different agents.  2.  Prostate cancer: No evidence of relapse at this time we will continue to follow without any need for intervention.   3.  IV access: Port-A-Cath currently in use without any issues.  4.  Antiemetics: Compazine is available to him without any nausea or vomiting.  5.  VZV prophylaxis: Currently on acyclovir without any evidence of reactivation.   6. Followup: He will continue to follow weekly for treatment and MD follow-up in 4 weeks.  30  minutes were dedicated to this encounter.  The time was spent on reviewing disease status, discussing treatment options and outlining future plan of care.    Zola Button, MD 5/6/202211:29 AM

## 2020-11-17 NOTE — Patient Instructions (Signed)
[facility]  Discharge Instructions: Thank you for choosing [facility] to provide your oncology and hematology care.   If you have a lab appointment with the Cancer Center, please go directly to the Cancer Center and check in at the registration area.   Wear comfortable clothing and clothing appropriate for easy access to any Portacath or PICC line.   We strive to give you quality time with your provider. You may need to reschedule your appointment if you arrive late (15 or more minutes).  Arriving late affects you and other patients whose appointments are after yours.  Also, if you miss three or more appointments without notifying the office, you may be dismissed from the clinic at the provider's discretion.      For prescription refill requests, have your pharmacy contact our office and allow 72 hours for refills to be completed.    Today you received the following chemotherapy and/or immunotherapy agents: Kyprolis    To help prevent nausea and vomiting after your treatment, we encourage you to take your nausea medication as directed.  BELOW ARE SYMPTOMS THAT SHOULD BE REPORTED IMMEDIATELY: . *FEVER GREATER THAN 100.4 F (38 C) OR HIGHER . *CHILLS OR SWEATING . *NAUSEA AND VOMITING THAT IS NOT CONTROLLED WITH YOUR NAUSEA MEDICATION . *UNUSUAL SHORTNESS OF BREATH . *UNUSUAL BRUISING OR BLEEDING . *URINARY PROBLEMS (pain or burning when urinating, or frequent urination) . *BOWEL PROBLEMS (unusual diarrhea, constipation, pain near the anus) . TENDERNESS IN MOUTH AND THROAT WITH OR WITHOUT PRESENCE OF ULCERS (sore throat, sores in mouth, or a toothache) . UNUSUAL RASH, SWELLING OR PAIN  . UNUSUAL VAGINAL DISCHARGE OR ITCHING   Items with * indicate a potential emergency and should be followed up as soon as possible or go to the Emergency Department if any problems should occur.  Please show the CHEMOTHERAPY ALERT CARD or IMMUNOTHERAPY ALERT  CARD at check-in to the Emergency Department and triage nurse.  Should you have questions after your visit or need to cancel or reschedule your appointment, please contact [facility]  Dept: [phone]  and follow the prompts.  Office hours are 8:00 a.m. to 4:30 p.m. Monday - Friday. Please note that voicemails left after 4:00 p.m. may not be returned until the following business day.  We are closed weekends and major holidays. You have access to a nurse at all times for urgent questions. Please call the main number to the clinic Dept: [phone] and follow the prompts.   For any non-urgent questions, you may also contact your provider using MyChart. We now offer e-Visits for anyone 18 and older to request care online for non-urgent symptoms. For details visit [email or web address].   Also download the MyChart app! Go to the app store, search "MyChart", open the app, select [place] [facility], and log in with your MyChart username and password.  Due to Covid, a mask is required upon entering the hospital/clinic. If you do not have a mask, one will be given to you upon arrival. For doctor visits, patients may have 1 support person aged 18 or older with them. For treatment visits, patients cannot have anyone with them due to current Covid guidelines and our immunocompromised population.   

## 2020-11-18 ENCOUNTER — Other Ambulatory Visit (HOSPITAL_COMMUNITY): Payer: Self-pay

## 2020-11-20 LAB — KAPPA/LAMBDA LIGHT CHAINS
Kappa free light chain: 14.5 mg/L (ref 3.3–19.4)
Kappa, lambda light chain ratio: 4.68 — ABNORMAL HIGH (ref 0.26–1.65)
Lambda free light chains: 3.1 mg/L — ABNORMAL LOW (ref 5.7–26.3)

## 2020-11-23 ENCOUNTER — Other Ambulatory Visit: Payer: Medicare Other

## 2020-11-23 ENCOUNTER — Inpatient Hospital Stay: Payer: Medicare Other

## 2020-11-23 ENCOUNTER — Other Ambulatory Visit: Payer: Self-pay

## 2020-11-23 VITALS — BP 174/91 | HR 74 | Temp 98.9°F | Resp 18 | Wt 159.0 lb

## 2020-11-23 DIAGNOSIS — C9002 Multiple myeloma in relapse: Secondary | ICD-10-CM | POA: Diagnosis not present

## 2020-11-23 DIAGNOSIS — C9001 Multiple myeloma in remission: Secondary | ICD-10-CM

## 2020-11-23 DIAGNOSIS — Z79899 Other long term (current) drug therapy: Secondary | ICD-10-CM | POA: Diagnosis not present

## 2020-11-23 DIAGNOSIS — G629 Polyneuropathy, unspecified: Secondary | ICD-10-CM | POA: Diagnosis not present

## 2020-11-23 DIAGNOSIS — Z5112 Encounter for antineoplastic immunotherapy: Secondary | ICD-10-CM | POA: Diagnosis not present

## 2020-11-23 LAB — CMP (CANCER CENTER ONLY)
ALT: 37 U/L (ref 0–44)
AST: 25 U/L (ref 15–41)
Albumin: 4 g/dL (ref 3.5–5.0)
Alkaline Phosphatase: 87 U/L (ref 38–126)
Anion gap: 9 (ref 5–15)
BUN: 20 mg/dL (ref 8–23)
CO2: 25 mmol/L (ref 22–32)
Calcium: 9.3 mg/dL (ref 8.9–10.3)
Chloride: 101 mmol/L (ref 98–111)
Creatinine: 0.95 mg/dL (ref 0.61–1.24)
GFR, Estimated: >60 mL/min (ref >60)
Glucose, Bld: 94 mg/dL (ref 70–99)
Potassium: 4.4 mmol/L (ref 3.5–5.1)
Sodium: 135 mmol/L (ref 135–145)
Total Bilirubin: 0.4 mg/dL (ref 0.3–1.2)
Total Protein: 7.4 g/dL (ref 6.5–8.1)

## 2020-11-23 LAB — CBC WITH DIFFERENTIAL (CANCER CENTER ONLY)
Abs Immature Granulocytes: 0.01 10*3/uL (ref 0.00–0.07)
Basophils Absolute: 0 10*3/uL (ref 0.0–0.1)
Basophils Relative: 0 %
Eosinophils Absolute: 0.1 10*3/uL (ref 0.0–0.5)
Eosinophils Relative: 2 %
HCT: 34.8 % — ABNORMAL LOW (ref 39.0–52.0)
Hemoglobin: 11.9 g/dL — ABNORMAL LOW (ref 13.0–17.0)
Immature Granulocytes: 0 %
Lymphocytes Relative: 25 %
Lymphs Abs: 1.4 10*3/uL (ref 0.7–4.0)
MCH: 34.3 pg — ABNORMAL HIGH (ref 26.0–34.0)
MCHC: 34.2 g/dL (ref 30.0–36.0)
MCV: 100.3 fL — ABNORMAL HIGH (ref 80.0–100.0)
Monocytes Absolute: 0.5 10*3/uL (ref 0.1–1.0)
Monocytes Relative: 9 %
Neutro Abs: 3.6 10*3/uL (ref 1.7–7.7)
Neutrophils Relative %: 64 %
Platelet Count: 176 10*3/uL (ref 150–400)
RBC: 3.47 MIL/uL — ABNORMAL LOW (ref 4.22–5.81)
RDW: 12.1 % (ref 11.5–15.5)
WBC Count: 5.6 10*3/uL (ref 4.0–10.5)
nRBC: 0 % (ref 0.0–0.2)

## 2020-11-23 MED ORDER — DIPHENHYDRAMINE HCL 25 MG PO CAPS
50.0000 mg | ORAL_CAPSULE | Freq: Once | ORAL | Status: AC
  Administered 2020-11-23: 50 mg via ORAL

## 2020-11-23 MED ORDER — DEXTROSE 5 % IV SOLN
56.0000 mg/m2 | Freq: Once | INTRAVENOUS | Status: AC
  Administered 2020-11-23: 110 mg via INTRAVENOUS
  Filled 2020-11-23: qty 30

## 2020-11-23 MED ORDER — SODIUM CHLORIDE 0.9 % IV SOLN
Freq: Once | INTRAVENOUS | Status: AC
Start: 2020-11-23 — End: 2020-11-23
  Filled 2020-11-23: qty 250

## 2020-11-23 MED ORDER — HEPARIN SOD (PORK) LOCK FLUSH 100 UNIT/ML IV SOLN
500.0000 [IU] | Freq: Once | INTRAVENOUS | Status: AC | PRN
  Administered 2020-11-23: 500 [IU]
  Filled 2020-11-23: qty 5

## 2020-11-23 MED ORDER — ACETAMINOPHEN 325 MG PO TABS
ORAL_TABLET | ORAL | Status: AC
  Filled 2020-11-23: qty 2

## 2020-11-23 MED ORDER — DARATUMUMAB-HYALURONIDASE-FIHJ 1800-30000 MG-UT/15ML ~~LOC~~ SOLN
1800.0000 mg | Freq: Once | SUBCUTANEOUS | Status: AC
  Administered 2020-11-23: 1800 mg via SUBCUTANEOUS
  Filled 2020-11-23: qty 15

## 2020-11-23 MED ORDER — ACETAMINOPHEN 325 MG PO TABS
650.0000 mg | ORAL_TABLET | Freq: Once | ORAL | Status: AC
  Administered 2020-11-23: 650 mg via ORAL

## 2020-11-23 MED ORDER — DIPHENHYDRAMINE HCL 25 MG PO CAPS
ORAL_CAPSULE | ORAL | Status: AC
  Filled 2020-11-23: qty 2

## 2020-11-23 MED ORDER — SODIUM CHLORIDE 0.9 % IV SOLN
Freq: Once | INTRAVENOUS | Status: DC
  Filled 2020-11-23: qty 250

## 2020-11-23 MED ORDER — SODIUM CHLORIDE 0.9 % IV SOLN
40.0000 mg | Freq: Once | INTRAVENOUS | Status: AC
  Administered 2020-11-23: 40 mg via INTRAVENOUS
  Filled 2020-11-23: qty 4

## 2020-11-23 MED ORDER — SODIUM CHLORIDE 0.9% FLUSH
10.0000 mL | INTRAVENOUS | Status: DC | PRN
  Administered 2020-11-23: 10 mL
  Filled 2020-11-23: qty 10

## 2020-11-23 NOTE — Patient Instructions (Signed)
[facility]  Discharge Instructions: Thank you for choosing [facility] to provide your oncology and hematology care.   If you have a lab appointment with the Cancer Center, please go directly to the Cancer Center and check in at the registration area.   Wear comfortable clothing and clothing appropriate for easy access to any Portacath or PICC line.   We strive to give you quality time with your provider. You may need to reschedule your appointment if you arrive late (15 or more minutes).  Arriving late affects you and other patients whose appointments are after yours.  Also, if you miss three or more appointments without notifying the office, you may be dismissed from the clinic at the provider's discretion.      For prescription refill requests, have your pharmacy contact our office and allow 72 hours for refills to be completed.    Today you received the following chemotherapy and/or immunotherapy agents Kyprolis & Darzalex      To help prevent nausea and vomiting after your treatment, we encourage you to take your nausea medication as directed.  BELOW ARE SYMPTOMS THAT SHOULD BE REPORTED IMMEDIATELY: *FEVER GREATER THAN 100.4 F (38 C) OR HIGHER *CHILLS OR SWEATING *NAUSEA AND VOMITING THAT IS NOT CONTROLLED WITH YOUR NAUSEA MEDICATION *UNUSUAL SHORTNESS OF BREATH *UNUSUAL BRUISING OR BLEEDING *URINARY PROBLEMS (pain or burning when urinating, or frequent urination) *BOWEL PROBLEMS (unusual diarrhea, constipation, pain near the anus) TENDERNESS IN MOUTH AND THROAT WITH OR WITHOUT PRESENCE OF ULCERS (sore throat, sores in mouth, or a toothache) UNUSUAL RASH, SWELLING OR PAIN  UNUSUAL VAGINAL DISCHARGE OR ITCHING   Items with * indicate a potential emergency and should be followed up as soon as possible or go to the Emergency Department if any problems should occur.  Please show the CHEMOTHERAPY ALERT CARD or IMMUNOTHERAPY ALERT CARD at  check-in to the Emergency Department and triage nurse.  Should you have questions after your visit or need to cancel or reschedule your appointment, please contact [facility]  Dept: [phone]  and follow the prompts.  Office hours are 8:00 a.m. to 4:30 p.m. Monday - Friday. Please note that voicemails left after 4:00 p.m. may not be returned until the following business day.  We are closed weekends and major holidays. You have access to a nurse at all times for urgent questions. Please call the main number to the clinic Dept: [phone] and follow the prompts.   For any non-urgent questions, you may also contact your provider using MyChart. We now offer e-Visits for anyone 18 and older to request care online for non-urgent symptoms. For details visit [email or web address].   Also download the MyChart app! Go to the app store, search "MyChart", open the app, select [facility], and log in with your MyChart username and password.  Due to Covid, a mask is required upon entering the hospital/clinic. If you do not have a mask, one will be given to you upon arrival. For doctor visits, patients may have 1 support person aged 18 or older with them. For treatment visits, patients cannot have anyone with them due to current Covid guidelines and our immunocompromised population.   

## 2020-11-24 LAB — MULTIPLE MYELOMA PANEL, SERUM
Albumin SerPl Elph-Mcnc: 4.6 g/dL — ABNORMAL HIGH (ref 2.9–4.4)
Albumin/Glob SerPl: 1.8 — ABNORMAL HIGH (ref 0.7–1.7)
Alpha 1: 0.2 g/dL (ref 0.0–0.4)
Alpha2 Glob SerPl Elph-Mcnc: 0.7 g/dL (ref 0.4–1.0)
B-Globulin SerPl Elph-Mcnc: 0.8 g/dL (ref 0.7–1.3)
Gamma Glob SerPl Elph-Mcnc: 1 g/dL (ref 0.4–1.8)
Globulin, Total: 2.7 g/dL (ref 2.2–3.9)
IgA: 18 mg/dL — ABNORMAL LOW (ref 61–437)
IgG (Immunoglobin G), Serum: 1228 mg/dL (ref 603–1613)
IgM (Immunoglobulin M), Srm: 12 mg/dL — ABNORMAL LOW (ref 20–172)
M Protein SerPl Elph-Mcnc: 0.7 g/dL — ABNORMAL HIGH
Total Protein ELP: 7.3 g/dL (ref 6.0–8.5)

## 2020-11-30 ENCOUNTER — Other Ambulatory Visit: Payer: Self-pay

## 2020-11-30 ENCOUNTER — Inpatient Hospital Stay: Payer: Medicare Other

## 2020-11-30 ENCOUNTER — Other Ambulatory Visit: Payer: Medicare Other

## 2020-11-30 ENCOUNTER — Telehealth: Payer: Self-pay

## 2020-11-30 VITALS — BP 166/95 | HR 62 | Temp 99.0°F | Resp 18 | Ht 73.0 in | Wt 154.1 lb

## 2020-11-30 DIAGNOSIS — G629 Polyneuropathy, unspecified: Secondary | ICD-10-CM | POA: Diagnosis not present

## 2020-11-30 DIAGNOSIS — Z5112 Encounter for antineoplastic immunotherapy: Secondary | ICD-10-CM | POA: Diagnosis not present

## 2020-11-30 DIAGNOSIS — C61 Malignant neoplasm of prostate: Secondary | ICD-10-CM

## 2020-11-30 DIAGNOSIS — Z95828 Presence of other vascular implants and grafts: Secondary | ICD-10-CM

## 2020-11-30 DIAGNOSIS — C9001 Multiple myeloma in remission: Secondary | ICD-10-CM

## 2020-11-30 DIAGNOSIS — Z79899 Other long term (current) drug therapy: Secondary | ICD-10-CM | POA: Diagnosis not present

## 2020-11-30 DIAGNOSIS — C9002 Multiple myeloma in relapse: Secondary | ICD-10-CM | POA: Diagnosis not present

## 2020-11-30 LAB — CBC WITH DIFFERENTIAL (CANCER CENTER ONLY)
Abs Immature Granulocytes: 0.01 10*3/uL (ref 0.00–0.07)
Basophils Absolute: 0 10*3/uL (ref 0.0–0.1)
Basophils Relative: 0 %
Eosinophils Absolute: 0.1 10*3/uL (ref 0.0–0.5)
Eosinophils Relative: 3 %
HCT: 34.7 % — ABNORMAL LOW (ref 39.0–52.0)
Hemoglobin: 11.7 g/dL — ABNORMAL LOW (ref 13.0–17.0)
Immature Granulocytes: 0 %
Lymphocytes Relative: 21 %
Lymphs Abs: 1.1 10*3/uL (ref 0.7–4.0)
MCH: 33.9 pg (ref 26.0–34.0)
MCHC: 33.7 g/dL (ref 30.0–36.0)
MCV: 100.6 fL — ABNORMAL HIGH (ref 80.0–100.0)
Monocytes Absolute: 0.5 10*3/uL (ref 0.1–1.0)
Monocytes Relative: 10 %
Neutro Abs: 3.5 10*3/uL (ref 1.7–7.7)
Neutrophils Relative %: 66 %
Platelet Count: 157 10*3/uL (ref 150–400)
RBC: 3.45 MIL/uL — ABNORMAL LOW (ref 4.22–5.81)
RDW: 12.1 % (ref 11.5–15.5)
WBC Count: 5.3 10*3/uL (ref 4.0–10.5)
nRBC: 0 % (ref 0.0–0.2)

## 2020-11-30 LAB — CMP (CANCER CENTER ONLY)
ALT: 19 U/L (ref 0–44)
AST: 17 U/L (ref 15–41)
Albumin: 3.8 g/dL (ref 3.5–5.0)
Alkaline Phosphatase: 79 U/L (ref 38–126)
Anion gap: 9 (ref 5–15)
BUN: 16 mg/dL (ref 8–23)
CO2: 25 mmol/L (ref 22–32)
Calcium: 9.1 mg/dL (ref 8.9–10.3)
Chloride: 105 mmol/L (ref 98–111)
Creatinine: 0.87 mg/dL (ref 0.61–1.24)
GFR, Estimated: >60 mL/min (ref >60)
Glucose, Bld: 84 mg/dL (ref 70–99)
Potassium: 4.5 mmol/L (ref 3.5–5.1)
Sodium: 139 mmol/L (ref 135–145)
Total Bilirubin: 0.4 mg/dL (ref 0.3–1.2)
Total Protein: 7.1 g/dL (ref 6.5–8.1)

## 2020-11-30 MED ORDER — SODIUM CHLORIDE 0.9 % IV SOLN
Freq: Once | INTRAVENOUS | Status: AC
  Filled 2020-11-30: qty 250

## 2020-11-30 MED ORDER — SODIUM CHLORIDE 0.9 % IV SOLN
40.0000 mg | Freq: Once | INTRAVENOUS | Status: AC
  Administered 2020-11-30: 40 mg via INTRAVENOUS
  Filled 2020-11-30: qty 4

## 2020-11-30 MED ORDER — ALTEPLASE 2 MG IJ SOLR
2.0000 mg | Freq: Once | INTRAMUSCULAR | Status: AC | PRN
  Administered 2020-11-30: 2 mg
  Filled 2020-11-30: qty 2

## 2020-11-30 MED ORDER — DEXTROSE 5 % IV SOLN
56.0000 mg/m2 | Freq: Once | INTRAVENOUS | Status: AC
  Administered 2020-11-30: 110 mg via INTRAVENOUS
  Filled 2020-11-30: qty 30

## 2020-11-30 MED ORDER — HEPARIN SOD (PORK) LOCK FLUSH 100 UNIT/ML IV SOLN
500.0000 [IU] | Freq: Once | INTRAVENOUS | Status: AC | PRN
  Administered 2020-11-30: 500 [IU]
  Filled 2020-11-30: qty 5

## 2020-11-30 MED ORDER — ALTEPLASE 2 MG IJ SOLR
INTRAMUSCULAR | Status: AC
  Filled 2020-11-30: qty 2

## 2020-11-30 MED ORDER — SODIUM CHLORIDE 0.9% FLUSH
10.0000 mL | INTRAVENOUS | Status: DC | PRN
  Administered 2020-11-30: 10 mL
  Filled 2020-11-30: qty 10

## 2020-11-30 NOTE — Patient Instructions (Signed)
[facility]  Discharge Instructions: Thank you for choosing [facility] to provide your oncology and hematology care.   If you have a lab appointment with the Cancer Center, please go directly to the Cancer Center and check in at the registration area.   Wear comfortable clothing and clothing appropriate for easy access to any Portacath or PICC line.   We strive to give you quality time with your provider. You may need to reschedule your appointment if you arrive late (15 or more minutes).  Arriving late affects you and other patients whose appointments are after yours.  Also, if you miss three or more appointments without notifying the office, you may be dismissed from the clinic at the provider's discretion.      For prescription refill requests, have your pharmacy contact our office and allow 72 hours for refills to be completed.    Today you received the following chemotherapy and/or immunotherapy agents: Kyprolis    To help prevent nausea and vomiting after your treatment, we encourage you to take your nausea medication as directed.  BELOW ARE SYMPTOMS THAT SHOULD BE REPORTED IMMEDIATELY: . *FEVER GREATER THAN 100.4 F (38 C) OR HIGHER . *CHILLS OR SWEATING . *NAUSEA AND VOMITING THAT IS NOT CONTROLLED WITH YOUR NAUSEA MEDICATION . *UNUSUAL SHORTNESS OF BREATH . *UNUSUAL BRUISING OR BLEEDING . *URINARY PROBLEMS (pain or burning when urinating, or frequent urination) . *BOWEL PROBLEMS (unusual diarrhea, constipation, pain near the anus) . TENDERNESS IN MOUTH AND THROAT WITH OR WITHOUT PRESENCE OF ULCERS (sore throat, sores in mouth, or a toothache) . UNUSUAL RASH, SWELLING OR PAIN  . UNUSUAL VAGINAL DISCHARGE OR ITCHING   Items with * indicate a potential emergency and should be followed up as soon as possible or go to the Emergency Department if any problems should occur.  Please show the CHEMOTHERAPY ALERT CARD or IMMUNOTHERAPY ALERT  CARD at check-in to the Emergency Department and triage nurse.  Should you have questions after your visit or need to cancel or reschedule your appointment, please contact [facility]  Dept: [phone]  and follow the prompts.  Office hours are 8:00 a.m. to 4:30 p.m. Monday - Friday. Please note that voicemails left after 4:00 p.m. may not be returned until the following business day.  We are closed weekends and major holidays. You have access to a nurse at all times for urgent questions. Please call the main number to the clinic Dept: [phone] and follow the prompts.   For any non-urgent questions, you may also contact your provider using MyChart. We now offer e-Visits for anyone 18 and older to request care online for non-urgent symptoms. For details visit [email or web address].   Also download the MyChart app! Go to the app store, search "MyChart", open the app, select [place], and log in with your MyChart username and password.  Due to Covid, a mask is required upon entering the hospital/clinic. If you do not have a mask, one will be given to you upon arrival. For doctor visits, patients may have 1 support person aged 18 or older with them. For treatment visits, patients cannot have anyone with them due to current Covid guidelines and our immunocompromised population.   

## 2020-12-07 ENCOUNTER — Inpatient Hospital Stay: Payer: Medicare Other

## 2020-12-07 ENCOUNTER — Other Ambulatory Visit: Payer: Self-pay

## 2020-12-07 ENCOUNTER — Other Ambulatory Visit: Payer: Medicare Other

## 2020-12-07 VITALS — BP 160/96 | HR 69 | Temp 98.4°F | Resp 17 | Wt 161.0 lb

## 2020-12-07 DIAGNOSIS — C61 Malignant neoplasm of prostate: Secondary | ICD-10-CM

## 2020-12-07 DIAGNOSIS — C9001 Multiple myeloma in remission: Secondary | ICD-10-CM

## 2020-12-07 DIAGNOSIS — G629 Polyneuropathy, unspecified: Secondary | ICD-10-CM | POA: Diagnosis not present

## 2020-12-07 DIAGNOSIS — Z5112 Encounter for antineoplastic immunotherapy: Secondary | ICD-10-CM | POA: Diagnosis not present

## 2020-12-07 DIAGNOSIS — Z79899 Other long term (current) drug therapy: Secondary | ICD-10-CM | POA: Diagnosis not present

## 2020-12-07 DIAGNOSIS — Z95828 Presence of other vascular implants and grafts: Secondary | ICD-10-CM

## 2020-12-07 DIAGNOSIS — C9002 Multiple myeloma in relapse: Secondary | ICD-10-CM | POA: Diagnosis not present

## 2020-12-07 LAB — CMP (CANCER CENTER ONLY)
ALT: 18 U/L (ref 0–44)
AST: 19 U/L (ref 15–41)
Albumin: 3.9 g/dL (ref 3.5–5.0)
Alkaline Phosphatase: 65 U/L (ref 38–126)
Anion gap: 10 (ref 5–15)
BUN: 14 mg/dL (ref 8–23)
CO2: 26 mmol/L (ref 22–32)
Calcium: 9.3 mg/dL (ref 8.9–10.3)
Chloride: 104 mmol/L (ref 98–111)
Creatinine: 0.83 mg/dL (ref 0.61–1.24)
GFR, Estimated: >60 mL/min (ref >60)
Glucose, Bld: 98 mg/dL (ref 70–99)
Potassium: 4.3 mmol/L (ref 3.5–5.1)
Sodium: 140 mmol/L (ref 135–145)
Total Bilirubin: 0.5 mg/dL (ref 0.3–1.2)
Total Protein: 7.2 g/dL (ref 6.5–8.1)

## 2020-12-07 LAB — CBC WITH DIFFERENTIAL (CANCER CENTER ONLY)
Abs Immature Granulocytes: 0.01 10*3/uL (ref 0.00–0.07)
Basophils Absolute: 0 10*3/uL (ref 0.0–0.1)
Basophils Relative: 0 %
Eosinophils Absolute: 0.1 10*3/uL (ref 0.0–0.5)
Eosinophils Relative: 2 %
HCT: 33.9 % — ABNORMAL LOW (ref 39.0–52.0)
Hemoglobin: 11.7 g/dL — ABNORMAL LOW (ref 13.0–17.0)
Immature Granulocytes: 0 %
Lymphocytes Relative: 27 %
Lymphs Abs: 1.1 10*3/uL (ref 0.7–4.0)
MCH: 34.3 pg — ABNORMAL HIGH (ref 26.0–34.0)
MCHC: 34.5 g/dL (ref 30.0–36.0)
MCV: 99.4 fL (ref 80.0–100.0)
Monocytes Absolute: 0.4 10*3/uL (ref 0.1–1.0)
Monocytes Relative: 10 %
Neutro Abs: 2.6 10*3/uL (ref 1.7–7.7)
Neutrophils Relative %: 61 %
Platelet Count: 167 10*3/uL (ref 150–400)
RBC: 3.41 MIL/uL — ABNORMAL LOW (ref 4.22–5.81)
RDW: 12.4 % (ref 11.5–15.5)
WBC Count: 4.3 10*3/uL (ref 4.0–10.5)
nRBC: 0 % (ref 0.0–0.2)

## 2020-12-07 MED ORDER — DARATUMUMAB-HYALURONIDASE-FIHJ 1800-30000 MG-UT/15ML ~~LOC~~ SOLN
1800.0000 mg | Freq: Once | SUBCUTANEOUS | Status: AC
  Administered 2020-12-07: 1800 mg via SUBCUTANEOUS
  Filled 2020-12-07: qty 15

## 2020-12-07 MED ORDER — DEXAMETHASONE SODIUM PHOSPHATE 100 MG/10ML IJ SOLN
40.0000 mg | Freq: Once | INTRAMUSCULAR | Status: AC
  Administered 2020-12-07: 40 mg via INTRAVENOUS
  Filled 2020-12-07: qty 4

## 2020-12-07 MED ORDER — DIPHENHYDRAMINE HCL 25 MG PO CAPS
ORAL_CAPSULE | ORAL | Status: AC
  Filled 2020-12-07: qty 2

## 2020-12-07 MED ORDER — SODIUM CHLORIDE 0.9 % IV SOLN
Freq: Once | INTRAVENOUS | Status: AC
  Filled 2020-12-07: qty 250

## 2020-12-07 MED ORDER — HEPARIN SOD (PORK) LOCK FLUSH 100 UNIT/ML IV SOLN
500.0000 [IU] | Freq: Once | INTRAVENOUS | Status: AC | PRN
  Administered 2020-12-07: 500 [IU]
  Filled 2020-12-07: qty 5

## 2020-12-07 MED ORDER — DIPHENHYDRAMINE HCL 25 MG PO CAPS
50.0000 mg | ORAL_CAPSULE | Freq: Once | ORAL | Status: AC
  Administered 2020-12-07: 50 mg via ORAL

## 2020-12-07 MED ORDER — ACETAMINOPHEN 325 MG PO TABS
650.0000 mg | ORAL_TABLET | Freq: Once | ORAL | Status: AC
Start: 2020-12-07 — End: 2020-12-07
  Administered 2020-12-07: 650 mg via ORAL

## 2020-12-07 MED ORDER — ACETAMINOPHEN 325 MG PO TABS
ORAL_TABLET | ORAL | Status: AC
  Filled 2020-12-07: qty 2

## 2020-12-07 MED ORDER — SODIUM CHLORIDE 0.9 % IV SOLN
Freq: Once | INTRAVENOUS | Status: AC
Start: 2020-12-07 — End: 2020-12-07
  Filled 2020-12-07: qty 250

## 2020-12-07 MED ORDER — SODIUM CHLORIDE 0.9% FLUSH
10.0000 mL | INTRAVENOUS | Status: DC | PRN
  Administered 2020-12-07: 10 mL
  Filled 2020-12-07: qty 10

## 2020-12-07 MED ORDER — CARFILZOMIB CHEMO INJECTION 60 MG
56.0000 mg/m2 | Freq: Once | INTRAVENOUS | Status: AC
  Administered 2020-12-07: 110 mg via INTRAVENOUS
  Filled 2020-12-07: qty 10

## 2020-12-07 MED ORDER — SODIUM CHLORIDE 0.9% FLUSH
10.0000 mL | INTRAVENOUS | Status: DC | PRN
  Administered 2020-12-07 (×2): 10 mL
  Filled 2020-12-07: qty 10

## 2020-12-08 LAB — KAPPA/LAMBDA LIGHT CHAINS
Kappa free light chain: 14 mg/L (ref 3.3–19.4)
Kappa, lambda light chain ratio: 4.38 — ABNORMAL HIGH (ref 0.26–1.65)
Lambda free light chains: 3.2 mg/L — ABNORMAL LOW (ref 5.7–26.3)

## 2020-12-12 LAB — MULTIPLE MYELOMA PANEL, SERUM
Albumin SerPl Elph-Mcnc: 4 g/dL (ref 2.9–4.4)
Albumin/Glob SerPl: 1.5 (ref 0.7–1.7)
Alpha 1: 0.2 g/dL (ref 0.0–0.4)
Alpha2 Glob SerPl Elph-Mcnc: 0.7 g/dL (ref 0.4–1.0)
B-Globulin SerPl Elph-Mcnc: 0.9 g/dL (ref 0.7–1.3)
Gamma Glob SerPl Elph-Mcnc: 1 g/dL (ref 0.4–1.8)
Globulin, Total: 2.8 g/dL (ref 2.2–3.9)
IgA: 18 mg/dL — ABNORMAL LOW (ref 61–437)
IgG (Immunoglobin G), Serum: 1203 mg/dL (ref 603–1613)
IgM (Immunoglobulin M), Srm: 13 mg/dL — ABNORMAL LOW (ref 20–172)
M Protein SerPl Elph-Mcnc: 0.9 g/dL — ABNORMAL HIGH
Total Protein ELP: 6.8 g/dL (ref 6.0–8.5)

## 2020-12-14 ENCOUNTER — Inpatient Hospital Stay (HOSPITAL_BASED_OUTPATIENT_CLINIC_OR_DEPARTMENT_OTHER): Payer: Medicare Other | Admitting: Oncology

## 2020-12-14 ENCOUNTER — Emergency Department (HOSPITAL_COMMUNITY)
Admission: EM | Admit: 2020-12-14 | Discharge: 2020-12-14 | Disposition: A | Discharge status: Home / Self Care | Payer: Medicare Other | Attending: Emergency Medicine | Admitting: Emergency Medicine

## 2020-12-14 ENCOUNTER — Emergency Department (HOSPITAL_COMMUNITY): Payer: Medicare Other

## 2020-12-14 ENCOUNTER — Other Ambulatory Visit: Payer: Medicare Other

## 2020-12-14 ENCOUNTER — Inpatient Hospital Stay: Payer: Medicare Other | Attending: Oncology

## 2020-12-14 ENCOUNTER — Other Ambulatory Visit: Payer: Self-pay

## 2020-12-14 ENCOUNTER — Inpatient Hospital Stay: Payer: Medicare Other

## 2020-12-14 ENCOUNTER — Encounter (HOSPITAL_COMMUNITY): Payer: Self-pay | Admitting: Emergency Medicine

## 2020-12-14 VITALS — BP 168/99 | HR 74 | Temp 99.5°F | Resp 18 | Wt 160.8 lb

## 2020-12-14 DIAGNOSIS — I10 Essential (primary) hypertension: Secondary | ICD-10-CM | POA: Insufficient documentation

## 2020-12-14 DIAGNOSIS — C9001 Multiple myeloma in remission: Secondary | ICD-10-CM

## 2020-12-14 DIAGNOSIS — U071 COVID-19: Secondary | ICD-10-CM

## 2020-12-14 DIAGNOSIS — M791 Myalgia, unspecified site: Secondary | ICD-10-CM | POA: Diagnosis present

## 2020-12-14 DIAGNOSIS — Z8546 Personal history of malignant neoplasm of prostate: Secondary | ICD-10-CM | POA: Insufficient documentation

## 2020-12-14 DIAGNOSIS — C9000 Multiple myeloma not having achieved remission: Secondary | ICD-10-CM

## 2020-12-14 DIAGNOSIS — R059 Cough, unspecified: Secondary | ICD-10-CM | POA: Diagnosis not present

## 2020-12-14 DIAGNOSIS — Z95828 Presence of other vascular implants and grafts: Secondary | ICD-10-CM

## 2020-12-14 DIAGNOSIS — Z886 Allergy status to analgesic agent status: Secondary | ICD-10-CM | POA: Insufficient documentation

## 2020-12-14 DIAGNOSIS — C61 Malignant neoplasm of prostate: Secondary | ICD-10-CM

## 2020-12-14 DIAGNOSIS — Z5112 Encounter for antineoplastic immunotherapy: Secondary | ICD-10-CM | POA: Insufficient documentation

## 2020-12-14 DIAGNOSIS — Z79899 Other long term (current) drug therapy: Secondary | ICD-10-CM | POA: Insufficient documentation

## 2020-12-14 LAB — CMP (CANCER CENTER ONLY)
ALT: 15 U/L (ref 0–44)
AST: 17 U/L (ref 15–41)
Albumin: 3.8 g/dL (ref 3.5–5.0)
Alkaline Phosphatase: 74 U/L (ref 38–126)
Anion gap: 11 (ref 5–15)
BUN: 12 mg/dL (ref 8–23)
CO2: 23 mmol/L (ref 22–32)
Calcium: 9.2 mg/dL (ref 8.9–10.3)
Chloride: 103 mmol/L (ref 98–111)
Creatinine: 0.84 mg/dL (ref 0.61–1.24)
GFR, Estimated: >60 mL/min (ref >60)
Glucose, Bld: 105 mg/dL — ABNORMAL HIGH (ref 70–99)
Potassium: 4.3 mmol/L (ref 3.5–5.1)
Sodium: 137 mmol/L (ref 135–145)
Total Bilirubin: 0.4 mg/dL (ref 0.3–1.2)
Total Protein: 7.3 g/dL (ref 6.5–8.1)

## 2020-12-14 LAB — CBC WITH DIFFERENTIAL (CANCER CENTER ONLY)
Abs Immature Granulocytes: 0.02 10*3/uL (ref 0.00–0.07)
Basophils Absolute: 0 10*3/uL (ref 0.0–0.1)
Basophils Relative: 0 %
Eosinophils Absolute: 0.1 10*3/uL (ref 0.0–0.5)
Eosinophils Relative: 1 %
HCT: 33.8 % — ABNORMAL LOW (ref 39.0–52.0)
Hemoglobin: 11.4 g/dL — ABNORMAL LOW (ref 13.0–17.0)
Immature Granulocytes: 0 %
Lymphocytes Relative: 16 %
Lymphs Abs: 1.2 10*3/uL (ref 0.7–4.0)
MCH: 34.1 pg — ABNORMAL HIGH (ref 26.0–34.0)
MCHC: 33.7 g/dL (ref 30.0–36.0)
MCV: 101.2 fL — ABNORMAL HIGH (ref 80.0–100.0)
Monocytes Absolute: 0.7 10*3/uL (ref 0.1–1.0)
Monocytes Relative: 9 %
Neutro Abs: 5.6 10*3/uL (ref 1.7–7.7)
Neutrophils Relative %: 74 %
Platelet Count: 163 10*3/uL (ref 150–400)
RBC: 3.34 MIL/uL — ABNORMAL LOW (ref 4.22–5.81)
RDW: 12.4 % (ref 11.5–15.5)
WBC Count: 7.7 10*3/uL (ref 4.0–10.5)
nRBC: 0 % (ref 0.0–0.2)

## 2020-12-14 LAB — RESP PANEL BY RT-PCR (FLU A&B, COVID) ARPGX2
Influenza A by PCR: NEGATIVE
Influenza B by PCR: NEGATIVE
SARS Coronavirus 2 by RT PCR: POSITIVE — AB

## 2020-12-14 MED ORDER — DEXTROSE 5 % IV SOLN
56.0000 mg/m2 | Freq: Once | INTRAVENOUS | Status: AC
  Administered 2020-12-14: 110 mg via INTRAVENOUS
  Filled 2020-12-14: qty 30

## 2020-12-14 MED ORDER — SODIUM CHLORIDE 0.9% FLUSH
10.0000 mL | INTRAVENOUS | Status: DC | PRN
  Administered 2020-12-14: 10 mL
  Filled 2020-12-14: qty 10

## 2020-12-14 MED ORDER — SODIUM CHLORIDE 0.9 % IV SOLN
Freq: Once | INTRAVENOUS | Status: AC
  Filled 2020-12-14: qty 250

## 2020-12-14 MED ORDER — NIRMATRELVIR/RITONAVIR (PAXLOVID)TABLET
3.0000 | ORAL_TABLET | Freq: Two times a day (BID) | ORAL | 0 refills | Status: AC
  Filled 2020-12-14: qty 30, 5d supply, fill #0

## 2020-12-14 MED ORDER — SODIUM CHLORIDE 0.9 % IV SOLN
40.0000 mg | Freq: Once | INTRAVENOUS | Status: AC
  Administered 2020-12-14: 40 mg via INTRAVENOUS
  Filled 2020-12-14: qty 4

## 2020-12-14 MED ORDER — HEPARIN SOD (PORK) LOCK FLUSH 100 UNIT/ML IV SOLN
500.0000 [IU] | Freq: Once | INTRAVENOUS | Status: AC | PRN
Start: 2020-12-14 — End: 2020-12-14
  Administered 2020-12-14: 500 [IU]
  Filled 2020-12-14: qty 5

## 2020-12-14 NOTE — ED Triage Notes (Signed)
Pt reports flu-like sx over last 2 days with body aches starting today. Pt wants to be tested for covid.

## 2020-12-14 NOTE — Progress Notes (Signed)
Hematology and Oncology Follow Up Visit  Travis Nguyen [id number] April 15, 1951 70 y.o. 12/14/2020 7:57 AM Travis Nguyen Travis Nguyen, Travis Brow, MD   Principle Diagnosis: 70 year old man with IgG kappa multiple myeloma diagnosed in 2014.  He developed relapsed disease in January 2022.  Secondary diagnosis: Stage a T1c, Gleason score 3+4 = 7 prostate cancer that is currently in remission.  Prior Therapy: He was treated initially with Cytoxan, Velcade and Decadron and subsequently his regimen changed to a Velcade, Revlimid with dexamethasone.  He achieved remission at that time.   He is status post a robotic-assisted laparoscopic radical prostatectomy and bilateral lymph node dissection on 02/09/2014. The final pathology showed prostate adenocarcinoma Gleason score 4+3 equals 7 involving both lobes.    Current therapy: Carfilzomib, and dexamethasone, daratumumab started on July 28, 2020.  Dexamethasone and carfilzomib is weekly with daratumumab per protocol and currently every 2 weeks.    Interim History:  Mr. Scharnhorst is here for a follow-up visit.  Since the last visit, he reports no major changes in his health.  He continues to tolerate treatment without any complaints.  He denies any nausea, vomiting or abdominal pain.  Denies any neuropathy or fatigue.  He is eating well and has increased appetite.  He has gained some weight.  He denies any recent hospitalization or illnesses.  He denies any bone pain or pathological fractures.     Medications: Reviewed without changes. Current Outpatient Medications  Medication Sig Dispense Refill  . acyclovir (ZOVIRAX) 400 MG tablet TAKE 1 TABLET BY MOUTH DAILY. 90 tablet 3  . escitalopram (LEXAPRO) 10 MG tablet TAKE 1 TABLET BY MOUTH ONCE A DAY 90 tablet 1  . escitalopram (LEXAPRO) 10 MG tablet TAKE 1 TABLET BY MOUTH ONCE A DAY 30 tablet 1  . losartan (COZAAR) 100 MG tablet Take 100 mg by mouth every morning.     [name] losartan (COZAAR) 100 MG tablet  TAKE 1 TABLET BY MOUTH ONCE A DAY 90 tablet 0  . losartan (COZAAR) 100 MG tablet Take 1 tablet (100 mg total) by mouth daily 90 tablet 1  . losartan (COZAAR) 100 MG tablet Take  1 tablet by mouth Once a day 90 days 90 tablet 1  . oxyCODONE-acetaminophen (PERCOCET) 10-325 MG tablet Take 1 tablet by mouth as needed.  0  . oxyCODONE-acetaminophen (PERCOCET) 10-325 MG tablet TAKE 1 TABLET BY MOUTH 2 TIMES DAILY AS NEEDED DO NOT FILL UNTIL 09/21/20 60 tablet 0  . oxyCODONE-acetaminophen (PERCOCET) 10-325 MG tablet TAKE 1 TABLET BY MOUTH 2 TIMES DAILY AS NEEDED 60 tablet 0  . oxyCODONE-acetaminophen (PERCOCET) 10-325 MG tablet TAKE 1 TABLET BY MOUTH 2 TIMES DAILY AS NEEDED (07/27/20) 60 tablet 0  . oxyCODONE-acetaminophen (PERCOCET) 10-325 MG tablet TAKE 1 TABLET BY MOUTH 2 TIMES DAILY AS NEEDED FOR 30 DAYS (FILL 06/28/20) 60 tablet 0  . oxyCODONE-acetaminophen (PERCOCET) 10-325 MG tablet TAKE 1 TABLET BY MOUTH TWICE DAILY AS NEEDED (30 DAY SUPPLY) (11/18/20) 60 tablet 0   No current facility-administered medications for this visit.     Allergies:  Allergies  Allergen Reactions  . Aspirin     Pt stated "raises BP"        Physical Exam:   Blood pressure (!) 168/99, pulse 74, temperature 99.5 F (37.5 C), temperature source Oral, resp. rate 18, weight 160 lb 12.8 oz (72.9 kg), SpO2 100 %.     ECOG: 1   General appearance: Alert, awake without any distress. Head: Atraumatic without abnormalities Oropharynx: Without any  thrush or ulcers. Eyes: No scleral icterus. Lymph nodes: No lymphadenopathy noted in the cervical, supraclavicular, or axillary nodes Heart:regular rate and rhythm, without any murmurs or gallops.   Lung: Clear to auscultation without any rhonchi, wheezes or dullness to percussion. Abdomin: Soft, nontender without any shifting dullness or ascites. Musculoskeletal: No clubbing or cyanosis. Neurological: No motor or sensory deficits. Skin: No rashes or  lesions.            Lab Results: Lab Results  Component Value Date   WBC 4.3 12/07/2020   HGB 11.7 (L) 12/07/2020   HCT 33.9 (L) 12/07/2020   MCV 99.4 12/07/2020   PLT 167 12/07/2020     Chemistry      Component Value Date/Time   NA 140 12/07/2020 1239   NA 138 03/27/2017 0838   K 4.3 12/07/2020 1239   K 4.3 03/27/2017 0838   CL 104 12/07/2020 1239   CL 105 01/01/2013 0835   CO2 26 12/07/2020 1239   CO2 24 03/27/2017 0838   BUN 14 12/07/2020 1239   BUN 23.7 03/27/2017 0838   CREATININE 0.83 12/07/2020 1239   CREATININE 1.2 03/27/2017 0838      Component Value Date/Time   CALCIUM 9.3 12/07/2020 1239   CALCIUM 9.9 03/27/2017 0838   ALKPHOS 65 12/07/2020 1239   ALKPHOS 79 03/27/2017 0838   AST 19 12/07/2020 1239   AST 25 03/27/2017 0838   ALT 18 12/07/2020 1239   ALT 26 03/27/2017 0838   BILITOT 0.5 12/07/2020 1239   BILITOT 0.31 03/27/2017 0838       Results for Travis Nguyen, Travis Nguyen (MRN [id number]) as of 12/14/2020 08:01  Ref. Range 11/17/2020 11:58 12/07/2020 12:39  M Protein SerPl Elph-Mcnc Latest Ref Range: Not Observed g/dL 0.7 (H) 0.9 (H)  IFE 1 Unknown Comment (A) Comment (A)  Globulin, Total Latest Ref Range: 2.2 - 3.9 g/dL 2.7 2.8  B-Globulin SerPl Elph-Mcnc Latest Ref Range: 0.7 - 1.3 g/dL 0.8 0.9  IgG (Immunoglobin G), Serum Latest Ref Range: 603 - 1,613 mg/dL 1,228 1,203  IgM (Immunoglobulin M), Srm Latest Ref Range: 20 - 172 mg/dL 12 (L) 13 (L)  IgA Latest Ref Range: 61 - 437 mg/dL 18 (L) 18 (L)     Results for Travis Nguyen, Travis Nguyen (MRN [id number]) as of 12/14/2020 08:01  Ref. Range 11/17/2020 11:58 12/07/2020 12:39  Kappa free light chain Latest Ref Range: 3.3 - 19.4 mg/L 14.5 14.0  Lamda free light chains Latest Ref Range: 5.7 - 26.3 mg/L 3.1 (L) 3.2 (L)  Kappa, lamda light chain ratio Latest Ref Range: 0.26 - 1.65  4.68 (H) 4.38 (H)     Impression and Plan:  70 year old man with:   1.  Multiple myeloma diagnosed in 2014.  He developed  relapsed in 2022.   He continues to tolerate the current salvage therapy without any major complications.  Risks and benefits of continuing this treatment were reviewed at this time.  Complications include nausea, vomiting, myelosuppression, neutropenia and possible sepsis were reiterated.  Peripheral neuropathy among other neurological complaints were reviewed.  Protein studies obtained on Dec 07, 2020 continues to show reasonable response but not dramatically different than previous counts.  He is experiencing a partial response at this point.  I recommended continuing the same dose as scheduled for the time being.  He is agreeable to continue.  2.  Prostate cancer: He was diagnosed with localized disease without any evidence of relapse at this time.   3.  IV access:  Port-A-Cath continues to be in use without any issues currently.  4.  Antiemetics: No nausea or vomiting reported at this time.  Compazine is available to him.   5.  VZV prophylaxis: No reactivation on acyclovir currently.   6. Followup: He will continue to receive treatment weekly and MD follow-up in 4 weeks.  30  minutes were spent on this visit.  The time was dedicated to reviewing disease status, discussing treatment options and outlining complications related to his cancer and cancer therapy.    Zola Button, MD 6/2/20227:57 AM
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
## 2020-12-14 NOTE — Patient Instructions (Signed)
[facility] MEDICAL [facility]COLOGY  Discharge Instructions: Thank you for choosing [facility] to provide your oncology and hematology care.   If you have a lab appointment with the Cancer Center, please go directly to the Cancer Center and check in at the registration area.   Wear comfortable clothing and clothing appropriate for easy access to any Portacath or PICC line.   We strive to give you quality time with your provider. You may need to reschedule your appointment if you arrive late (15 or more minutes).  Arriving late affects you and other patients whose appointments are after yours.  Also, if you miss three or more appointments without notifying the office, you may be dismissed from the clinic at the provider's discretion.      For prescription refill requests, have your pharmacy contact our office and allow 72 hours for refills to be completed.    Today you received the following chemotherapy and/or immunotherapy agents: Kyprolis    To help prevent nausea and vomiting after your treatment, we encourage you to take your nausea medication as directed.  BELOW ARE SYMPTOMS THAT SHOULD BE REPORTED IMMEDIATELY: . *FEVER GREATER THAN 100.4 F (38 C) OR HIGHER . *CHILLS OR SWEATING . *NAUSEA AND VOMITING THAT IS NOT CONTROLLED WITH YOUR NAUSEA MEDICATION . *UNUSUAL SHORTNESS OF BREATH . *UNUSUAL BRUISING OR BLEEDING . *URINARY PROBLEMS (pain or burning when urinating, or frequent urination) . *BOWEL PROBLEMS (unusual diarrhea, constipation, pain near the anus) . TENDERNESS IN MOUTH AND THROAT WITH OR WITHOUT PRESENCE OF ULCERS (sore throat, sores in mouth, or a toothache) . UNUSUAL RASH, SWELLING OR PAIN  . UNUSUAL VAGINAL DISCHARGE OR ITCHING   Items with * indicate a potential emergency and should be followed up as soon as possible or go to the Emergency Department if any problems should occur.  Please show the CHEMOTHERAPY ALERT CARD or IMMUNOTHERAPY ALERT  CARD at check-in to the Emergency Department and triage nurse.  Should you have questions after your visit or need to cancel or reschedule your appointment, please contact [facility]  Dept: [phone]  and follow the prompts.  Office hours are 8:00 a.m. to 4:30 p.m. Monday - Friday. Please note that voicemails left after 4:00 p.m. may not be returned until the following business day.  We are closed weekends and major holidays. You have access to a nurse at all times for urgent questions. Please call the main number to the clinic Dept: [phone] and follow the prompts.   For any non-urgent questions, you may also contact your provider using MyChart. We now offer e-Visits for anyone 18 and older to request care online for non-urgent symptoms. For details visit [email or web address].   Also download the MyChart app! Go to the app store, search "MyChart", open the app, select [facility], and log in with your MyChart username and password.  Due to Covid, a mask is required upon entering the hospital/clinic. If you do not have a mask, one will be given to you upon arrival. For doctor visits, patients may have 1 support person aged 18 or older with them. For treatment visits, patients cannot have anyone with them due to current Covid guidelines and our immunocompromised population.   

## 2020-12-14 NOTE — Discharge Instructions (Signed)
You can start taking the medication for COVID tomorrow.  Your x-ray today looked normal.  Your labs from the cancer center earlier today are okay.  Your wife should be tested and if she test positive because of her other medical problems she most likely needs to take the medication for COVID as well.  If you start having difficulty eating, weakness, passing out or trouble breathing return to the emergency room.

## 2020-12-14 NOTE — ED Provider Notes (Signed)
Emergency Medicine Provider Triage Evaluation Note  Travis Nguyen , a 70 y.o. male  was evaluated in triage.  Pt complains of 2-day history of body aches, cough and is concerned that he may have COVID.  Would like a COVID test as he is undergoing treatment at the cancer center.  Denies any chest pain or shortness of breath but does report myalgias.  Review of Systems  Positive: Myalgias, cough Negative: Chest pain, shortness of breath  Physical Exam  BP (!) 163/83 (BP Location: Left Arm)   Pulse 89   Temp 98.4 F (36.9 C) (Oral)   Resp 16   SpO2 99%  Gen:   Awake, no distress Resp:  Normal effort MSK:   Moves extremities without difficulty Other:  Abdomen is soft and nontender  Medical Decision Making  Medically screening exam initiated at 6:36 PM.  Appropriate orders placed.  Travis Nguyen was informed that the remainder of the evaluation will be completed by another provider, this initial triage assessment does not replace that evaluation, and the importance of remaining in the ED until their evaluation is complete.  Will order COVID test and chest x-ray   Travis Heady, PA-C 12/14/20 1836    Travis Dessert, MD 12/15/20 [phone]

## 2020-12-15 ENCOUNTER — Other Ambulatory Visit (HOSPITAL_COMMUNITY): Payer: Self-pay

## 2020-12-15 MED ORDER — OXYCODONE-ACETAMINOPHEN 10-325 MG PO TABS
ORAL_TABLET | ORAL | 0 refills | Status: DC
  Filled 2020-12-15 – 2020-12-16 (×2): qty 60, 30d supply, fill #0

## 2020-12-16 ENCOUNTER — Other Ambulatory Visit (HOSPITAL_COMMUNITY): Payer: Self-pay

## 2020-12-21 ENCOUNTER — Encounter (HOSPITAL_COMMUNITY): Payer: Self-pay

## 2020-12-21 ENCOUNTER — Other Ambulatory Visit: Payer: Self-pay

## 2020-12-21 ENCOUNTER — Telehealth: Payer: Self-pay | Admitting: *Deleted

## 2020-12-21 ENCOUNTER — Emergency Department (HOSPITAL_COMMUNITY)
Admission: EM | Admit: 2020-12-21 | Discharge: 2020-12-21 | Disposition: A | Discharge status: Home / Self Care | Payer: Medicare Other | Attending: Emergency Medicine | Admitting: Emergency Medicine

## 2020-12-21 DIAGNOSIS — U071 COVID-19: Secondary | ICD-10-CM | POA: Diagnosis not present

## 2020-12-21 DIAGNOSIS — R509 Fever, unspecified: Secondary | ICD-10-CM | POA: Insufficient documentation

## 2020-12-21 DIAGNOSIS — Z79899 Other long term (current) drug therapy: Secondary | ICD-10-CM | POA: Diagnosis not present

## 2020-12-21 DIAGNOSIS — I10 Essential (primary) hypertension: Secondary | ICD-10-CM | POA: Diagnosis not present

## 2020-12-21 DIAGNOSIS — Z87891 Personal history of nicotine dependence: Secondary | ICD-10-CM | POA: Insufficient documentation

## 2020-12-21 DIAGNOSIS — Z8579 Personal history of other malignant neoplasms of lymphoid, hematopoietic and related tissues: Secondary | ICD-10-CM | POA: Diagnosis not present

## 2020-12-21 DIAGNOSIS — Z9221 Personal history of antineoplastic chemotherapy: Secondary | ICD-10-CM | POA: Diagnosis not present

## 2020-12-21 DIAGNOSIS — Z8546 Personal history of malignant neoplasm of prostate: Secondary | ICD-10-CM | POA: Insufficient documentation

## 2020-12-21 LAB — SARS CORONAVIRUS 2 (TAT 6-24 HRS): SARS Coronavirus 2: POSITIVE — AB

## 2020-12-21 NOTE — ED Notes (Signed)
PA Henderly aware of BP

## 2020-12-21 NOTE — Telephone Encounter (Signed)
Patient called to report he has COVID and tested positive on 12/14/2020.    He had to cancel his infusions for Kyprolis and Darzalex Faspro on 12/22/2020 and 12/29/2020.  He still has his infusion scheduled for 01/05/2021.  Per policy patients can return to regular infusion room after 21 days from positive date and his infusion date of 01/05/2021 is fine.    If delaying treatment is hazardous to his health within the 21 days then he can return post 10 days but has to be treated in Infusion bedroom under COVID protocol.  Routed to Dr Alen Blew to please advise which plan to go with.

## 2020-12-21 NOTE — ED Triage Notes (Signed)
Pt arrives POV for eval of covid. States he wants to be tested to see if it's gone. Reports he has a grandchild and wants to see if he can be around the grandchild
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
## 2020-12-22 ENCOUNTER — Other Ambulatory Visit: Payer: Medicare Other

## 2020-12-22 ENCOUNTER — Ambulatory Visit: Payer: Medicare Other

## 2020-12-29 ENCOUNTER — Other Ambulatory Visit: Payer: Medicare Other

## 2020-12-29 ENCOUNTER — Ambulatory Visit: Payer: Medicare Other

## 2021-01-05 ENCOUNTER — Inpatient Hospital Stay: Payer: Medicare Other

## 2021-01-05 ENCOUNTER — Other Ambulatory Visit: Payer: Self-pay

## 2021-01-05 ENCOUNTER — Other Ambulatory Visit: Payer: Self-pay | Admitting: Oncology

## 2021-01-05 VITALS — BP 173/95 | HR 55 | Temp 98.6°F | Resp 18 | Wt 163.2 lb

## 2021-01-05 DIAGNOSIS — C9000 Multiple myeloma not having achieved remission: Secondary | ICD-10-CM | POA: Diagnosis not present

## 2021-01-05 DIAGNOSIS — Z886 Allergy status to analgesic agent status: Secondary | ICD-10-CM | POA: Diagnosis not present

## 2021-01-05 DIAGNOSIS — C9001 Multiple myeloma in remission: Secondary | ICD-10-CM

## 2021-01-05 DIAGNOSIS — Z8546 Personal history of malignant neoplasm of prostate: Secondary | ICD-10-CM | POA: Diagnosis not present

## 2021-01-05 DIAGNOSIS — C9002 Multiple myeloma in relapse: Secondary | ICD-10-CM | POA: Diagnosis present

## 2021-01-05 DIAGNOSIS — C61 Malignant neoplasm of prostate: Secondary | ICD-10-CM

## 2021-01-05 DIAGNOSIS — Z95828 Presence of other vascular implants and grafts: Secondary | ICD-10-CM

## 2021-01-05 DIAGNOSIS — Z79899 Other long term (current) drug therapy: Secondary | ICD-10-CM | POA: Diagnosis not present

## 2021-01-05 DIAGNOSIS — Z5112 Encounter for antineoplastic immunotherapy: Secondary | ICD-10-CM | POA: Diagnosis not present

## 2021-01-05 LAB — CBC WITH DIFFERENTIAL (CANCER CENTER ONLY)
Abs Immature Granulocytes: 0.01 10*3/uL (ref 0.00–0.07)
Basophils Absolute: 0 10*3/uL (ref 0.0–0.1)
Basophils Relative: 0 %
Eosinophils Absolute: 0.1 10*3/uL (ref 0.0–0.5)
Eosinophils Relative: 2 %
HCT: 32.3 % — ABNORMAL LOW (ref 39.0–52.0)
Hemoglobin: 10.7 g/dL — ABNORMAL LOW (ref 13.0–17.0)
Immature Granulocytes: 0 %
Lymphocytes Relative: 36 %
Lymphs Abs: 1.2 10*3/uL (ref 0.7–4.0)
MCH: 33.3 pg (ref 26.0–34.0)
MCHC: 33.1 g/dL (ref 30.0–36.0)
MCV: 100.6 fL — ABNORMAL HIGH (ref 80.0–100.0)
Monocytes Absolute: 0.3 10*3/uL (ref 0.1–1.0)
Monocytes Relative: 9 %
Neutro Abs: 1.8 10*3/uL (ref 1.7–7.7)
Neutrophils Relative %: 53 %
Platelet Count: 173 10*3/uL (ref 150–400)
RBC: 3.21 MIL/uL — ABNORMAL LOW (ref 4.22–5.81)
RDW: 12.6 % (ref 11.5–15.5)
WBC Count: 3.4 10*3/uL — ABNORMAL LOW (ref 4.0–10.5)
nRBC: 0 % (ref 0.0–0.2)

## 2021-01-05 LAB — CMP (CANCER CENTER ONLY)
ALT: 13 U/L (ref 0–44)
AST: 16 U/L (ref 15–41)
Albumin: 3.9 g/dL (ref 3.5–5.0)
Alkaline Phosphatase: 63 U/L (ref 38–126)
Anion gap: 7 (ref 5–15)
BUN: 13 mg/dL (ref 8–23)
CO2: 24 mmol/L (ref 22–32)
Calcium: 8.9 mg/dL (ref 8.9–10.3)
Chloride: 107 mmol/L (ref 98–111)
Creatinine: 0.81 mg/dL (ref 0.61–1.24)
GFR, Estimated: >60 mL/min (ref >60)
Glucose, Bld: 92 mg/dL (ref 70–99)
Potassium: 4.1 mmol/L (ref 3.5–5.1)
Sodium: 138 mmol/L (ref 135–145)
Total Bilirubin: 0.4 mg/dL (ref 0.3–1.2)
Total Protein: 7 g/dL (ref 6.5–8.1)

## 2021-01-05 MED ORDER — SODIUM CHLORIDE 0.9% FLUSH
10.0000 mL | INTRAVENOUS | Status: DC | PRN
  Administered 2021-01-05: 10 mL
  Filled 2021-01-05: qty 10

## 2021-01-05 MED ORDER — DIPHENHYDRAMINE HCL 25 MG PO CAPS
50.0000 mg | ORAL_CAPSULE | Freq: Once | ORAL | Status: AC
Start: 2021-01-05 — End: 2021-01-05
  Administered 2021-01-05: 50 mg via ORAL

## 2021-01-05 MED ORDER — SODIUM CHLORIDE 0.9 % IV SOLN
Freq: Once | INTRAVENOUS | Status: AC
  Filled 2021-01-05: qty 250

## 2021-01-05 MED ORDER — DARATUMUMAB-HYALURONIDASE-FIHJ 1800-30000 MG-UT/15ML ~~LOC~~ SOLN
1800.0000 mg | Freq: Once | SUBCUTANEOUS | Status: AC
  Administered 2021-01-05: 1800 mg via SUBCUTANEOUS
  Filled 2021-01-05: qty 15

## 2021-01-05 MED ORDER — SODIUM CHLORIDE 0.9 % IV SOLN
40.0000 mg | Freq: Once | INTRAVENOUS | Status: AC
  Administered 2021-01-05: 40 mg via INTRAVENOUS
  Filled 2021-01-05: qty 4

## 2021-01-05 MED ORDER — DIPHENHYDRAMINE HCL 25 MG PO CAPS
ORAL_CAPSULE | ORAL | Status: AC
  Filled 2021-01-05: qty 2

## 2021-01-05 MED ORDER — ACETAMINOPHEN 325 MG PO TABS
650.0000 mg | ORAL_TABLET | Freq: Once | ORAL | Status: AC
  Administered 2021-01-05: 650 mg via ORAL

## 2021-01-05 MED ORDER — SODIUM CHLORIDE 0.9 % IV SOLN
Freq: Once | INTRAVENOUS | Status: DC
  Filled 2021-01-05: qty 250

## 2021-01-05 MED ORDER — HEPARIN SOD (PORK) LOCK FLUSH 100 UNIT/ML IV SOLN
500.0000 [IU] | Freq: Once | INTRAVENOUS | Status: AC | PRN
  Administered 2021-01-05: 500 [IU]
  Filled 2021-01-05: qty 5

## 2021-01-05 MED ORDER — ACETAMINOPHEN 325 MG PO TABS
ORAL_TABLET | ORAL | Status: AC
  Filled 2021-01-05: qty 2

## 2021-01-05 MED ORDER — DEXTROSE 5 % IV SOLN
56.0000 mg/m2 | Freq: Once | INTRAVENOUS | Status: AC
  Administered 2021-01-05: 110 mg via INTRAVENOUS
  Filled 2021-01-05: qty 10

## 2021-01-07 ENCOUNTER — Emergency Department (HOSPITAL_COMMUNITY)
Admission: EM | Admit: 2021-01-07 | Discharge: 2021-01-07 | Disposition: A | Discharge status: Home / Self Care | Payer: Medicare Other | Attending: Emergency Medicine | Admitting: Emergency Medicine

## 2021-01-07 ENCOUNTER — Encounter (HOSPITAL_COMMUNITY): Payer: Self-pay

## 2021-01-07 ENCOUNTER — Other Ambulatory Visit: Payer: Self-pay

## 2021-01-07 DIAGNOSIS — R1030 Lower abdominal pain, unspecified: Secondary | ICD-10-CM | POA: Diagnosis not present

## 2021-01-07 DIAGNOSIS — Z79899 Other long term (current) drug therapy: Secondary | ICD-10-CM | POA: Insufficient documentation

## 2021-01-07 DIAGNOSIS — Z8546 Personal history of malignant neoplasm of prostate: Secondary | ICD-10-CM | POA: Diagnosis not present

## 2021-01-07 DIAGNOSIS — Z9049 Acquired absence of other specified parts of digestive tract: Secondary | ICD-10-CM | POA: Diagnosis not present

## 2021-01-07 DIAGNOSIS — C9000 Multiple myeloma not having achieved remission: Secondary | ICD-10-CM | POA: Diagnosis not present

## 2021-01-07 DIAGNOSIS — Z87891 Personal history of nicotine dependence: Secondary | ICD-10-CM | POA: Insufficient documentation

## 2021-01-07 DIAGNOSIS — R109 Unspecified abdominal pain: Secondary | ICD-10-CM | POA: Diagnosis not present

## 2021-01-07 DIAGNOSIS — R531 Weakness: Secondary | ICD-10-CM | POA: Diagnosis not present

## 2021-01-07 DIAGNOSIS — D84821 Immunodeficiency due to drugs: Secondary | ICD-10-CM | POA: Diagnosis not present

## 2021-01-07 DIAGNOSIS — R197 Diarrhea, unspecified: Secondary | ICD-10-CM

## 2021-01-07 DIAGNOSIS — Z8616 Personal history of COVID-19: Secondary | ICD-10-CM | POA: Diagnosis not present

## 2021-01-07 DIAGNOSIS — I10 Essential (primary) hypertension: Secondary | ICD-10-CM | POA: Diagnosis not present

## 2021-01-07 DIAGNOSIS — Z743 Need for continuous supervision: Secondary | ICD-10-CM | POA: Diagnosis not present

## 2021-01-07 HISTORY — DX: COVID-19: U07.1

## 2021-01-07 LAB — COMPREHENSIVE METABOLIC PANEL
ALT: 19 U/L (ref 0–44)
AST: 20 U/L (ref 15–41)
Albumin: 4.1 g/dL (ref 3.5–5.0)
Alkaline Phosphatase: 56 U/L (ref 38–126)
Anion gap: 9 (ref 5–15)
BUN: 18 mg/dL (ref 8–23)
CO2: 21 mmol/L — ABNORMAL LOW (ref 22–32)
Calcium: 9.3 mg/dL (ref 8.9–10.3)
Chloride: 106 mmol/L (ref 98–111)
Creatinine, Ser: 1.04 mg/dL (ref 0.61–1.24)
GFR, Estimated: >60 mL/min (ref >60)
Glucose, Bld: 115 mg/dL — ABNORMAL HIGH (ref 70–99)
Potassium: 3.6 mmol/L (ref 3.5–5.1)
Sodium: 136 mmol/L (ref 135–145)
Total Bilirubin: 0.7 mg/dL (ref 0.3–1.2)
Total Protein: 7.1 g/dL (ref 6.5–8.1)

## 2021-01-07 LAB — CBC WITH DIFFERENTIAL/PLATELET
Abs Immature Granulocytes: 0.02 10*3/uL (ref 0.00–0.07)
Basophils Absolute: 0 10*3/uL (ref 0.0–0.1)
Basophils Relative: 0 %
Eosinophils Absolute: 0.1 10*3/uL (ref 0.0–0.5)
Eosinophils Relative: 2 %
HCT: 36.1 % — ABNORMAL LOW (ref 39.0–52.0)
Hemoglobin: 11.8 g/dL — ABNORMAL LOW (ref 13.0–17.0)
Immature Granulocytes: 0 %
Lymphocytes Relative: 17 %
Lymphs Abs: 0.8 10*3/uL (ref 0.7–4.0)
MCH: 34.1 pg — ABNORMAL HIGH (ref 26.0–34.0)
MCHC: 32.7 g/dL (ref 30.0–36.0)
MCV: 104.3 fL — ABNORMAL HIGH (ref 80.0–100.0)
Monocytes Absolute: 0.4 10*3/uL (ref 0.1–1.0)
Monocytes Relative: 8 %
Neutro Abs: 3.4 10*3/uL (ref 1.7–7.7)
Neutrophils Relative %: 73 %
Platelets: 148 10*3/uL — ABNORMAL LOW (ref 150–400)
RBC: 3.46 MIL/uL — ABNORMAL LOW (ref 4.22–5.81)
RDW: 12.9 % (ref 11.5–15.5)
WBC: 4.7 10*3/uL (ref 4.0–10.5)
nRBC: 0.4 % — ABNORMAL HIGH (ref 0.0–0.2)

## 2021-01-07 LAB — POC OCCULT BLOOD, ED: Fecal Occult Bld: POSITIVE — AB

## 2021-01-07 MED ORDER — HEPARIN SOD (PORK) LOCK FLUSH 100 UNIT/ML IV SOLN
500.0000 [IU] | INTRAVENOUS | Status: AC | PRN
  Administered 2021-01-07: 500 [IU]
  Filled 2021-01-07: qty 5

## 2021-01-07 MED ORDER — PANTOPRAZOLE SODIUM 20 MG PO TBEC
20.0000 mg | DELAYED_RELEASE_TABLET | Freq: Every day | ORAL | 0 refills | Status: DC
  Filled 2021-01-07: qty 14, 14d supply, fill #0

## 2021-01-07 MED ORDER — SODIUM CHLORIDE 0.9 % IV BOLUS
500.0000 mL | Freq: Once | INTRAVENOUS | Status: AC
Start: 2021-01-07 — End: 2021-01-07
  Administered 2021-01-07: 500 mL via INTRAVENOUS

## 2021-01-07 NOTE — Discharge Instructions (Addendum)
Your stool today was positive for trace blood. Please follow-up with gastroenterology for further evaluation. Get rechecked if you develop bloody stools, black stools profound weakness or new concerning symptoms.
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
## 2021-01-07 NOTE — ED Triage Notes (Signed)
Pt arrived via [facility] from home for c/o 7/10 sharp epigastric pain and diarrhea that started at 1600 today. Pt is A&Ox4.

## 2021-01-08 ENCOUNTER — Other Ambulatory Visit (HOSPITAL_COMMUNITY): Payer: Self-pay

## 2021-01-08 LAB — GASTROINTESTINAL PANEL BY PCR, STOOL (REPLACES STOOL CULTURE)

## 2021-01-08 LAB — MULTIPLE MYELOMA PANEL, SERUM
Albumin SerPl Elph-Mcnc: 3.9 g/dL (ref 2.9–4.4)
Albumin/Glob SerPl: 1.5 (ref 0.7–1.7)
Alpha 1: 0.2 g/dL (ref 0.0–0.4)
Alpha2 Glob SerPl Elph-Mcnc: 0.7 g/dL (ref 0.4–1.0)
B-Globulin SerPl Elph-Mcnc: 0.8 g/dL (ref 0.7–1.3)
Gamma Glob SerPl Elph-Mcnc: 1 g/dL (ref 0.4–1.8)
Globulin, Total: 2.7 g/dL (ref 2.2–3.9)
IgA: 21 mg/dL — ABNORMAL LOW (ref 61–437)
IgG (Immunoglobin G), Serum: 1129 mg/dL (ref 603–1613)
IgM (Immunoglobulin M), Srm: 16 mg/dL — ABNORMAL LOW (ref 20–172)
M Protein SerPl Elph-Mcnc: 0.9 g/dL — ABNORMAL HIGH
Total Protein ELP: 6.6 g/dL (ref 6.0–8.5)

## 2021-01-08 LAB — KAPPA/LAMBDA LIGHT CHAINS
Kappa free light chain: 15.1 mg/L (ref 3.3–19.4)
Kappa, lambda light chain ratio: 4.58 — ABNORMAL HIGH (ref 0.26–1.65)
Lambda free light chains: 3.3 mg/L — ABNORMAL LOW (ref 5.7–26.3)

## 2021-01-08 LAB — C DIFFICILE QUICK SCREEN W PCR REFLEX
C Diff antigen: NEGATIVE
C Diff interpretation: NOT DETECTED
C Diff toxin: NEGATIVE

## 2021-01-09 ENCOUNTER — Telehealth: Payer: Self-pay | Admitting: Oncology

## 2021-01-09 NOTE — Telephone Encounter (Signed)
Called patient regarding upcoming appointments, patient is notified of all July appointments. Patient will be reminded per mychart.

## 2021-01-11 ENCOUNTER — Other Ambulatory Visit (HOSPITAL_COMMUNITY): Payer: Self-pay

## 2021-01-11 MED ORDER — OXYCODONE-ACETAMINOPHEN 10-325 MG PO TABS
ORAL_TABLET | ORAL | 0 refills | Status: DC
  Filled 2021-01-13: qty 60, 30d supply, fill #0

## 2021-01-12 ENCOUNTER — Inpatient Hospital Stay: Payer: Medicare Other

## 2021-01-12 ENCOUNTER — Inpatient Hospital Stay (HOSPITAL_BASED_OUTPATIENT_CLINIC_OR_DEPARTMENT_OTHER): Payer: Medicare Other | Admitting: Oncology

## 2021-01-12 ENCOUNTER — Inpatient Hospital Stay: Payer: Medicare Other | Attending: Oncology

## 2021-01-12 ENCOUNTER — Other Ambulatory Visit: Payer: Self-pay

## 2021-01-12 VITALS — BP 175/91

## 2021-01-12 VITALS — BP 193/85 | HR 66 | Temp 98.5°F | Resp 18 | Ht 73.0 in | Wt 159.4 lb

## 2021-01-12 DIAGNOSIS — C9001 Multiple myeloma in remission: Secondary | ICD-10-CM | POA: Diagnosis not present

## 2021-01-12 DIAGNOSIS — Z5112 Encounter for antineoplastic immunotherapy: Secondary | ICD-10-CM | POA: Insufficient documentation

## 2021-01-12 DIAGNOSIS — C9002 Multiple myeloma in relapse: Secondary | ICD-10-CM | POA: Diagnosis not present

## 2021-01-12 LAB — CBC WITH DIFFERENTIAL (CANCER CENTER ONLY)
Abs Immature Granulocytes: 0.01 10*3/uL (ref 0.00–0.07)
Basophils Absolute: 0 10*3/uL (ref 0.0–0.1)
Basophils Relative: 0 %
Eosinophils Absolute: 0.1 10*3/uL (ref 0.0–0.5)
Eosinophils Relative: 1 %
HCT: 31.7 % — ABNORMAL LOW (ref 39.0–52.0)
Hemoglobin: 10.9 g/dL — ABNORMAL LOW (ref 13.0–17.0)
Immature Granulocytes: 0 %
Lymphocytes Relative: 26 %
Lymphs Abs: 1.2 10*3/uL (ref 0.7–4.0)
MCH: 33.7 pg (ref 26.0–34.0)
MCHC: 34.4 g/dL (ref 30.0–36.0)
MCV: 98.1 fL (ref 80.0–100.0)
Monocytes Absolute: 0.5 10*3/uL (ref 0.1–1.0)
Monocytes Relative: 11 %
Neutro Abs: 2.7 10*3/uL (ref 1.7–7.7)
Neutrophils Relative %: 62 %
Platelet Count: 121 10*3/uL — ABNORMAL LOW (ref 150–400)
RBC: 3.23 MIL/uL — ABNORMAL LOW (ref 4.22–5.81)
RDW: 12.6 % (ref 11.5–15.5)
WBC Count: 4.4 10*3/uL (ref 4.0–10.5)
nRBC: 0 % (ref 0.0–0.2)

## 2021-01-12 LAB — CMP (CANCER CENTER ONLY)
ALT: 22 U/L (ref 0–44)
AST: 17 U/L (ref 15–41)
Albumin: 3.6 g/dL (ref 3.5–5.0)
Alkaline Phosphatase: 71 U/L (ref 38–126)
Anion gap: 9 (ref 5–15)
BUN: 13 mg/dL (ref 8–23)
CO2: 23 mmol/L (ref 22–32)
Calcium: 8.7 mg/dL — ABNORMAL LOW (ref 8.9–10.3)
Chloride: 109 mmol/L (ref 98–111)
Creatinine: 0.93 mg/dL (ref 0.61–1.24)
GFR, Estimated: >60 mL/min (ref >60)
Glucose, Bld: 110 mg/dL — ABNORMAL HIGH (ref 70–99)
Potassium: 3.9 mmol/L (ref 3.5–5.1)
Sodium: 141 mmol/L (ref 135–145)
Total Bilirubin: 0.5 mg/dL (ref 0.3–1.2)
Total Protein: 6.9 g/dL (ref 6.5–8.1)

## 2021-01-12 MED ORDER — SODIUM CHLORIDE 0.9% FLUSH
10.0000 mL | INTRAVENOUS | Status: DC | PRN
  Administered 2021-01-12: 10 mL
  Filled 2021-01-12: qty 10

## 2021-01-12 MED ORDER — SODIUM CHLORIDE 0.9 % IV SOLN
Freq: Once | INTRAVENOUS | Status: DC
  Filled 2021-01-12: qty 250

## 2021-01-12 MED ORDER — SODIUM CHLORIDE 0.9 % IV SOLN
40.0000 mg | Freq: Once | INTRAVENOUS | Status: AC
  Administered 2021-01-12: 40 mg via INTRAVENOUS
  Filled 2021-01-12: qty 4

## 2021-01-12 MED ORDER — HEPARIN SOD (PORK) LOCK FLUSH 100 UNIT/ML IV SOLN
500.0000 [IU] | Freq: Once | INTRAVENOUS | Status: AC | PRN
  Administered 2021-01-12: 500 [IU]
  Filled 2021-01-12: qty 5

## 2021-01-12 MED ORDER — DEXTROSE 5 % IV SOLN
56.0000 mg/m2 | Freq: Once | INTRAVENOUS | Status: AC
  Administered 2021-01-12: 110 mg via INTRAVENOUS
  Filled 2021-01-12: qty 10

## 2021-01-12 MED ORDER — SODIUM CHLORIDE 0.9 % IV SOLN
Freq: Once | INTRAVENOUS | Status: AC
Start: 2021-01-12 — End: 2021-01-12
  Filled 2021-01-12: qty 250

## 2021-01-12 NOTE — Patient Instructions (Signed)
Implanted Port Home Guide An implanted port is a device that is placed under the skin. It is usually placed in the chest. The device can be used to give IV medicine, to take blood, or for dialysis. You may have an implanted port if: You need IV medicine that would be irritating to the small veins in your hands or arms. You need IV medicines, such as antibiotics, for a long period of time. You need IV nutrition for a long period of time. You need dialysis. When you have a port, your health care provider can choose to use the port instead of veins in your arms for these procedures. You may have fewer limitations when using a port than you would if you used other types of long-term IVs, and you will likely be able to return to normal activities afteryour incision heals. An implanted port has two main parts: Reservoir. The reservoir is the part where a needle is inserted to give medicines or draw blood. The reservoir is round. After it is placed, it appears as a small, raised area under your skin. Catheter. The catheter is a thin, flexible tube that connects the reservoir to a vein. Medicine that is inserted into the reservoir goes into the catheter and then into the vein. How is my port accessed? To access your port: A numbing cream may be placed on the skin over the port site. Your health care provider will put on a mask and sterile gloves. The skin over your port will be cleaned carefully with a germ-killing soap and allowed to dry. Your health care provider will gently pinch the port and insert a needle into it. Your health care provider will check for a blood return to make sure the port is in the vein and is not clogged. If your port needs to remain accessed to get medicine continuously (constant infusion), your health care provider will place a clear bandage (dressing) over the needle site. The dressing and needle will need to be changed every week, or as told by your health care provider. What  is flushing? Flushing helps keep the port from getting clogged. Follow instructions from your health care provider about how and when to flush the port. Ports are usually flushed with saline solution or a medicine called heparin. The need for flushing will depend on how the port is used: If the port is only used from time to time to give medicines or draw blood, the port may need to be flushed: Before and after medicines have been given. Before and after blood has been drawn. As part of routine maintenance. Flushing may be recommended every 4-6 weeks. If a constant infusion is running, the port may not need to be flushed. Throw away any syringes in a disposal container that is meant for sharp items (sharps container). You can buy a sharps container from a pharmacy, or you can make one by using an empty hard plastic bottle with a cover. How long will my port stay implanted? The port can stay in for as long as your health care provider thinks it is needed. When it is time for the port to come out, a surgery will be done to remove it. The surgery will be similar to the procedure that was done to putthe port in. Follow these instructions at home:  Flush your port as told by your health care provider. If you need an infusion over several days, follow instructions from your health care provider about how to take   care of your port site. Make sure you: Wash your hands with soap and water before you change your dressing. If soap and water are not available, use alcohol-based hand sanitizer. Change your dressing as told by your health care provider. Place any used dressings or infusion bags into a plastic bag. Throw that bag in the trash. Keep the dressing that covers the needle clean and dry. Do not get it wet. Do not use scissors or sharp objects near the tube. Keep the tube clamped, unless it is being used. Check your port site every day for signs of infection. Check for: Redness, swelling, or  pain. Fluid or blood. Pus or a bad smell. Protect the skin around the port site. Avoid wearing bra straps that rub or irritate the site. Protect the skin around your port from seat belts. Place a soft pad over your chest if needed. Bathe or shower as told by your health care provider. The site may get wet as long as you are not actively receiving an infusion. Return to your normal activities as told by your health care provider. Ask your health care provider what activities are safe for you. Carry a medical alert card or wear a medical alert bracelet at all times. This will let health care providers know that you have an implanted port in case of an emergency. Get help right away if: You have redness, swelling, or pain at the port site. You have fluid or blood coming from your port site. You have pus or a bad smell coming from the port site. You have a fever. Summary Implanted ports are usually placed in the chest for long-term IV access. Follow instructions from your health care provider about flushing the port and changing bandages (dressings). Take care of the area around your port by avoiding clothing that puts pressure on the area, and by watching for signs of infection. Protect the skin around your port from seat belts. Place a soft pad over your chest if needed. Get help right away if you have a fever or you have redness, swelling, pain, drainage, or a bad smell at the port site. This information is not intended to replace advice given to you by your health care provider. Make sure you discuss any questions you have with your healthcare provider. Document Revised: 11/15/2019 Document Reviewed: 11/15/2019 Elsevier Patient Education  2022 Elsevier Inc.  

## 2021-01-12 NOTE — Progress Notes (Signed)
Hematology and Oncology Follow Up Visit  Travis Nguyen [id number] [birth year] 70 y.o. 01/12/2021 11:52 AM Travis Nguyen, Travis Brow, MD   Principle Diagnosis: 70 year old man with relapsed multiple myeloma noted in January 2022.  He presented with IgG kappa diagnosed in 2014.  [name]  Secondary diagnosis: Stage a T1c, Gleason score 3+4 = 7 prostate cancer that is currently in remission.  Prior Therapy: He was treated initially with Cytoxan, Velcade and Decadron and subsequently his regimen changed to a Velcade, Revlimid with dexamethasone.  He achieved remission at that time.   He is status post a robotic-assisted laparoscopic radical prostatectomy and bilateral lymph node dissection on 02/09/2014. The final pathology showed prostate adenocarcinoma Gleason score 4+3 equals 7 involving both lobes.    Current therapy: Carfilzomib, and dexamethasone, daratumumab started on July 28, 2020.  Dexamethasone and carfilzomib is weekly with daratumumab every 2 weeks per protocol.  Interim History:  Mr. Travis Nguyen is here for repeat evaluation.  Since the last visit, he was diagnosed with COVID-19 infection after presenting with mild symptoms.  He was prescribed Paxil that with symptoms resolving rather quickly.  He did miss close to 3 weeks of cancer treatment.  He did also developed recent symptoms of diarrhea and did require emergency department evaluation and currently resolved.  Clinically, he feels reasonably well without any issues.  He denies any nausea, vomiting or abdominal pain.  He denies any bone pain or pathological fractures.     Medications: Updated on review. Current Outpatient Medications  Medication Sig Dispense Refill   acyclovir (ZOVIRAX) 400 MG tablet TAKE 1 TABLET BY MOUTH DAILY. (Patient not taking: No sig reported) 90 tablet 3   calcium carbonate (TUMS - DOSED IN MG ELEMENTAL CALCIUM) 500 MG chewable tablet Chew 1 tablet by mouth daily as needed for indigestion or  heartburn.     losartan (COZAAR) 100 MG tablet TAKE 1 TABLET BY MOUTH ONCE A DAY (Patient taking differently: Take 100 mg by mouth daily.) 90 tablet 0   Nirmatrelvir-Ritonavir (PAXLOVID PO) Take 3 tablets by mouth See admin instructions. Bid x 5 days (Patient not taking: No sig reported)     oxyCODONE-acetaminophen (PERCOCET) 10-325 MG tablet TAKE 1 TABLET BY MOUTH 2 TIMES DAILY AS NEEDED DO NOT FILL UNTIL 09/21/20 (Patient not taking: No sig reported) 60 tablet 0   oxyCODONE-acetaminophen (PERCOCET) 10-325 MG tablet Take 1 tablet by mouth 2 times a day (Patient taking differently: Take 1 tablet by mouth 2 (two) times daily as needed for pain.) 60 tablet 0   [START ON 01/13/2021] oxyCODONE-acetaminophen (PERCOCET) 10-325 MG tablet Take 1 tablet by mouth twice a day as needed Do not refill until 01/13/21 60 tablet 0   pantoprazole (PROTONIX) 20 MG tablet Take 1 tablet (20 mg total) by mouth daily. 14 tablet 0   vitamin C (ASCORBIC ACID) 500 MG tablet Take 500 mg by mouth daily.     No current facility-administered medications for this visit.     Allergies:  Allergies  Allergen Reactions   Aspirin     Pt stated "raises BP"        Physical Exam:   Blood pressure (!) 193/85, pulse 66, temperature 98.5 F (36.9 C), temperature source Tympanic, resp. rate 18, height _0  (1.854 m), weight 159 lb 6.4 oz (72.3 kg), SpO2 100 %.     ECOG: 1    General appearance: Comfortable appearing without any discomfort Head: Normocephalic without any trauma Oropharynx: Mucous membranes are moist and pink without  any thrush or ulcers. Eyes: Pupils are equal and round reactive to light. Lymph nodes: No cervical, supraclavicular, inguinal or axillary lymphadenopathy.   Heart:regular rate and rhythm.  S1 and S2 without leg edema. Lung: Clear without any rhonchi or wheezes.  No dullness to percussion. Abdomin: Soft, nontender, nondistended with good bowel sounds.  No  hepatosplenomegaly. Musculoskeletal: No joint deformity or effusion.  Full range of motion noted. Neurological: No deficits noted on motor, sensory and deep tendon reflex exam. Skin: No petechial rash or dryness.  Appeared moist.              Lab Results: Lab Results  Component Value Date   WBC 4.4 01/12/2021   HGB 10.9 (L) 01/12/2021   HCT 31.7 (L) 01/12/2021   MCV 98.1 01/12/2021   PLT 121 (L) 01/12/2021     Chemistry      Component Value Date/Time   NA 136 01/07/2021 1846   NA 138 03/27/2017 0838   K 3.6 01/07/2021 1846   K 4.3 03/27/2017 0838   CL 106 01/07/2021 1846   CL 105 01/01/2013 0835   CO2 21 (L) 01/07/2021 1846   CO2 24 03/27/2017 0838   BUN 18 01/07/2021 1846   BUN 23.7 03/27/2017 0838   CREATININE 1.04 01/07/2021 1846   CREATININE 0.81 01/05/2021 1014   CREATININE 1.2 03/27/2017 0838      Component Value Date/Time   CALCIUM 9.3 01/07/2021 1846   CALCIUM 9.9 03/27/2017 0838   ALKPHOS 56 01/07/2021 1846   ALKPHOS 79 03/27/2017 0838   AST 20 01/07/2021 1846   AST 16 01/05/2021 1014   AST 25 03/27/2017 0838   ALT 19 01/07/2021 1846   ALT 13 01/05/2021 1014   ALT 26 03/27/2017 0838   BILITOT 0.7 01/07/2021 1846   BILITOT 0.4 01/05/2021 1014   BILITOT 0.31 03/27/2017 0838     Results for Travis Nguyen (MRN [id number]) as of 01/12/2021 11:53  Ref. Range 10/12/2020 10:08 11/17/2020 11:58 12/07/2020 12:39 01/05/2021 10:14  M Protein SerPl Elph-Mcnc Latest Ref Range: Not Observed g/dL 1.0 (H) 0.7 (H) 0.9 (H) 0.9 (H)  IFE 1 Unknown Comment (A) Comment (A) Comment (A) Comment (A)  Globulin, Total Latest Ref Range: 2.2 - 3.9 g/dL 3.1 2.7 2.8 2.7  B-Globulin SerPl Elph-Mcnc Latest Ref Range: 0.7 - 1.3 g/dL 1.0 0.8 0.9 0.8  IgG (Immunoglobin G), Serum Latest Ref Range: 603 - 1,613 mg/dL 1,258 1,228 1,203 1,129  IgM (Immunoglobulin M), Srm Latest Ref Range: 20 - 172 mg/dL 12 (L) 12 (L) 13 (L) 16 (L)  IgA Latest Ref Range: 61 - 437 mg/dL 16 (L) 18 (L) 18  (L) 21 (L)    Results for Travis Nguyen (MRN [id number]) as of 01/12/2021 11:53  Ref. Range 12/07/2020 12:39 01/05/2021 10:14  Kappa free light chain Latest Ref Range: 3.3 - 19.4 mg/L 14.0 15.1  Lamda free light chains Latest Ref Range: 5.7 - 26.3 mg/L 3.2 (L) 3.3 (L)  Kappa, lamda light chain ratio Latest Ref Range: 0.26 - 1.65  4.38 (H) 4.58 (H)    Impression and Plan:  70 year old man with:   1.  IgG kappa multiple myeloma with relapsed disease in 2022.   He is currently on therapy utilizing Kyprolis, daratumumab with dexamethasone with reasonable tolerance and response.  Protein study on 01/05/2021 continues to show 0.9 g/dL after he missed 3 weeks of treatment due to COVID-19 infection.  Risks and benefits of resuming this treatment were discussed at this  time.  Additional complications include nausea, fatigue, neuropathy among other complaints were reiterated.  He is agreeable at this time.  2.  Prostate cancer: No evidence of relapsed disease at this time continues to be in remission.   3.  IV access: Port-A-Cath remains in use without any issues.  4.  Antiemetics: Compazine is available to him without any nausea vomiting.   5.  VZV prophylaxis: He is currently on acyclovir without any issues or reactivation.   6. Followup: We will continue current treatment with weekly  Kyprolis and dexamethasone with a daratumumab per protocol currently every other week.  30  minutes were spent on this encounter.  The time was dedicated to reviewing laboratory data, disease status update, treatment choices as well as addressing complications related to cancer and cancer therapy.    Zola Button, MD 7/1/202211:52 AM

## 2021-01-12 NOTE — Progress Notes (Signed)
Okay to treat with elevated BP per [name]

## 2021-01-13 ENCOUNTER — Other Ambulatory Visit (HOSPITAL_COMMUNITY): Payer: Self-pay

## 2021-01-19 ENCOUNTER — Inpatient Hospital Stay: Payer: Medicare Other

## 2021-01-19 ENCOUNTER — Other Ambulatory Visit: Payer: Self-pay

## 2021-01-19 VITALS — BP 186/106 | HR 56 | Temp 98.3°F | Resp 16 | Wt 159.8 lb

## 2021-01-19 DIAGNOSIS — C9001 Multiple myeloma in remission: Secondary | ICD-10-CM

## 2021-01-19 DIAGNOSIS — Z5112 Encounter for antineoplastic immunotherapy: Secondary | ICD-10-CM | POA: Diagnosis not present

## 2021-01-19 DIAGNOSIS — C9002 Multiple myeloma in relapse: Secondary | ICD-10-CM | POA: Diagnosis not present

## 2021-01-19 DIAGNOSIS — Z95828 Presence of other vascular implants and grafts: Secondary | ICD-10-CM

## 2021-01-19 DIAGNOSIS — C61 Malignant neoplasm of prostate: Secondary | ICD-10-CM

## 2021-01-19 LAB — CMP (CANCER CENTER ONLY)
ALT: 43 U/L (ref 0–44)
AST: 20 U/L (ref 15–41)
Albumin: 3.9 g/dL (ref 3.5–5.0)
Alkaline Phosphatase: 87 U/L (ref 38–126)
Anion gap: 8 (ref 5–15)
BUN: 12 mg/dL (ref 8–23)
CO2: 27 mmol/L (ref 22–32)
Calcium: 9 mg/dL (ref 8.9–10.3)
Chloride: 104 mmol/L (ref 98–111)
Creatinine: 0.8 mg/dL (ref 0.61–1.24)
GFR, Estimated: >60 mL/min (ref >60)
Glucose, Bld: 89 mg/dL (ref 70–99)
Potassium: 4.6 mmol/L (ref 3.5–5.1)
Sodium: 139 mmol/L (ref 135–145)
Total Bilirubin: 0.5 mg/dL (ref 0.3–1.2)
Total Protein: 7.2 g/dL (ref 6.5–8.1)

## 2021-01-19 LAB — CBC WITH DIFFERENTIAL (CANCER CENTER ONLY)
Abs Immature Granulocytes: 0.01 10*3/uL (ref 0.00–0.07)
Basophils Absolute: 0 10*3/uL (ref 0.0–0.1)
Basophils Relative: 0 %
Eosinophils Absolute: 0 10*3/uL (ref 0.0–0.5)
Eosinophils Relative: 1 %
HCT: 33.7 % — ABNORMAL LOW (ref 39.0–52.0)
Hemoglobin: 11.5 g/dL — ABNORMAL LOW (ref 13.0–17.0)
Immature Granulocytes: 0 %
Lymphocytes Relative: 35 %
Lymphs Abs: 1.3 10*3/uL (ref 0.7–4.0)
MCH: 33.9 pg (ref 26.0–34.0)
MCHC: 34.1 g/dL (ref 30.0–36.0)
MCV: 99.4 fL (ref 80.0–100.0)
Monocytes Absolute: 0.4 10*3/uL (ref 0.1–1.0)
Monocytes Relative: 10 %
Neutro Abs: 2.1 10*3/uL (ref 1.7–7.7)
Neutrophils Relative %: 54 %
Platelet Count: 193 10*3/uL (ref 150–400)
RBC: 3.39 MIL/uL — ABNORMAL LOW (ref 4.22–5.81)
RDW: 12.4 % (ref 11.5–15.5)
WBC Count: 3.8 10*3/uL — ABNORMAL LOW (ref 4.0–10.5)
nRBC: 0 % (ref 0.0–0.2)

## 2021-01-19 MED ORDER — SODIUM CHLORIDE 0.9% FLUSH
10.0000 mL | INTRAVENOUS | Status: DC | PRN
  Administered 2021-01-19: 10 mL
  Filled 2021-01-19: qty 10

## 2021-01-19 MED ORDER — DEXTROSE 5 % IV SOLN
56.0000 mg/m2 | Freq: Once | INTRAVENOUS | Status: AC
  Administered 2021-01-19: 110 mg via INTRAVENOUS
  Filled 2021-01-19: qty 30

## 2021-01-19 MED ORDER — DARATUMUMAB-HYALURONIDASE-FIHJ 1800-30000 MG-UT/15ML ~~LOC~~ SOLN
1800.0000 mg | Freq: Once | SUBCUTANEOUS | Status: AC
  Administered 2021-01-19: 1800 mg via SUBCUTANEOUS
  Filled 2021-01-19: qty 15

## 2021-01-19 MED ORDER — DIPHENHYDRAMINE HCL 25 MG PO CAPS
50.0000 mg | ORAL_CAPSULE | Freq: Once | ORAL | Status: AC
  Administered 2021-01-19: 50 mg via ORAL

## 2021-01-19 MED ORDER — ACETAMINOPHEN 325 MG PO TABS
ORAL_TABLET | ORAL | Status: AC
  Filled 2021-01-19: qty 2

## 2021-01-19 MED ORDER — SODIUM CHLORIDE 0.9 % IV SOLN
Freq: Once | INTRAVENOUS | Status: AC
  Filled 2021-01-19: qty 250

## 2021-01-19 MED ORDER — DIPHENHYDRAMINE HCL 25 MG PO CAPS
ORAL_CAPSULE | ORAL | Status: AC
  Filled 2021-01-19: qty 2

## 2021-01-19 MED ORDER — HEPARIN SOD (PORK) LOCK FLUSH 100 UNIT/ML IV SOLN
500.0000 [IU] | Freq: Once | INTRAVENOUS | Status: AC | PRN
  Administered 2021-01-19: 500 [IU]
  Filled 2021-01-19: qty 5

## 2021-01-19 MED ORDER — SODIUM CHLORIDE 0.9 % IV SOLN
40.0000 mg | Freq: Once | INTRAVENOUS | Status: AC
  Administered 2021-01-19: 40 mg via INTRAVENOUS
  Filled 2021-01-19: qty 4

## 2021-01-19 MED ORDER — SODIUM CHLORIDE 0.9% FLUSH
10.0000 mL | INTRAVENOUS | Status: DC | PRN
Start: 2021-01-19 — End: 2021-01-19
  Administered 2021-01-19: 10 mL
  Filled 2021-01-19: qty 10

## 2021-01-19 MED ORDER — ACETAMINOPHEN 325 MG PO TABS
650.0000 mg | ORAL_TABLET | Freq: Once | ORAL | Status: AC
  Administered 2021-01-19: 650 mg via ORAL

## 2021-01-19 NOTE — Progress Notes (Signed)
K to treat with BP of 186/106 and HR of 56 per Dr.[name]

## 2021-01-19 NOTE — Patient Instructions (Signed)
[facility]  Discharge Instructions: Thank you for choosing [facility] to provide your oncology and hematology care.   If you have a lab appointment with the [facility], please go directly to the [facility] and check in at the registration area.   Wear comfortable clothing and clothing appropriate for easy access to any Portacath or PICC line.   We strive to give you quality time with your provider. You may need to reschedule your appointment if you arrive late (15 or more minutes).  Arriving late affects you and other patients whose appointments are after yours.  Also, if you miss three or more appointments without notifying the office, you may be dismissed from the clinic at the provider's discretion.      For prescription refill requests, have your pharmacy contact our office and allow 72 hours for refills to be completed.    Today you received the following chemotherapy and/or immunotherapy agents kyprolis and darzalex faspro      To help prevent nausea and vomiting after your treatment, we encourage you to take your nausea medication as directed.  BELOW ARE SYMPTOMS THAT SHOULD BE REPORTED IMMEDIATELY: *FEVER GREATER THAN 100.4 F (38 C) OR HIGHER *CHILLS OR SWEATING *NAUSEA AND VOMITING THAT IS NOT CONTROLLED WITH YOUR NAUSEA MEDICATION *UNUSUAL SHORTNESS OF BREATH *UNUSUAL BRUISING OR BLEEDING *URINARY PROBLEMS (pain or burning when urinating, or frequent urination) *BOWEL PROBLEMS (unusual diarrhea, constipation, pain near the anus) TENDERNESS IN MOUTH AND THROAT WITH OR WITHOUT PRESENCE OF ULCERS (sore throat, sores in mouth, or a toothache) UNUSUAL RASH, SWELLING OR PAIN  UNUSUAL VAGINAL DISCHARGE OR ITCHING   Items with * indicate a potential emergency and should be followed up as soon as possible or go to the Emergency Department if any problems should occur.  Please show the CHEMOTHERAPY ALERT CARD or IMMUNOTHERAPY ALERT  CARD at check-in to the Emergency Department and triage nurse.  Should you have questions after your visit or need to cancel or reschedule your appointment, please contact [facility]  Dept: [phone]  and follow the prompts.  Office hours are 8:00 a.m. to 4:30 p.m. Monday - Friday. Please note that voicemails left after 4:00 p.m. may not be returned until the following business day.  We are closed weekends and major holidays. You have access to a nurse at all times for urgent questions. Please call the main number to the clinic Dept: [phone] and follow the prompts.   For any non-urgent questions, you may also contact your provider using MyChart. We now offer e-Visits for anyone 73 and older to request care online for non-urgent symptoms. For details visit [email or web address].   Also download the MyChart app! Go to the app store, search "MyChart", open the app, select [facility], and log in with your MyChart username and password.  Due to Covid, a mask is required upon entering the hospital/clinic. If you do not have a mask, one will be given to you upon arrival. For doctor visits, patients may have 1 support person aged 73 or older with them. For treatment visits, patients cannot have anyone with them due to current Covid guidelines and our immunocompromised population.

## 2021-01-22 ENCOUNTER — Other Ambulatory Visit (HOSPITAL_COMMUNITY): Payer: Self-pay

## 2021-01-22 DIAGNOSIS — M5416 Radiculopathy, lumbar region: Secondary | ICD-10-CM | POA: Diagnosis not present

## 2021-01-22 DIAGNOSIS — I10 Essential (primary) hypertension: Secondary | ICD-10-CM | POA: Diagnosis not present

## 2021-01-22 MED ORDER — LOSARTAN POTASSIUM 100 MG PO TABS
ORAL_TABLET | ORAL | 1 refills | Status: DC
  Filled 2021-01-22: qty 90, 90d supply, fill #0
  Filled 2021-04-17: qty 90, 90d supply, fill #1

## 2021-01-22 MED ORDER — ESCITALOPRAM OXALATE 10 MG PO TABS
ORAL_TABLET | ORAL | 1 refills | Status: DC
  Filled 2021-01-22: qty 90, 90d supply, fill #0
  Filled 2021-04-17: qty 90, 90d supply, fill #1

## 2021-01-22 MED ORDER — AMLODIPINE BESYLATE 5 MG PO TABS
ORAL_TABLET | ORAL | 1 refills | Status: DC
  Filled 2021-01-22: qty 90, 90d supply, fill #0
  Filled 2021-04-17: qty 90, 90d supply, fill #1

## 2021-01-26 ENCOUNTER — Inpatient Hospital Stay: Payer: Medicare Other

## 2021-01-26 ENCOUNTER — Other Ambulatory Visit: Payer: Self-pay

## 2021-01-26 VITALS — BP 174/81 | HR 67 | Temp 98.3°F | Resp 19 | Wt 157.1 lb

## 2021-01-26 DIAGNOSIS — Z5112 Encounter for antineoplastic immunotherapy: Secondary | ICD-10-CM | POA: Diagnosis not present

## 2021-01-26 DIAGNOSIS — C9001 Multiple myeloma in remission: Secondary | ICD-10-CM

## 2021-01-26 DIAGNOSIS — C9002 Multiple myeloma in relapse: Secondary | ICD-10-CM | POA: Diagnosis not present

## 2021-01-26 LAB — CBC WITH DIFFERENTIAL (CANCER CENTER ONLY)
Abs Immature Granulocytes: 0.01 10*3/uL (ref 0.00–0.07)
Basophils Absolute: 0 10*3/uL (ref 0.0–0.1)
Basophils Relative: 0 %
Eosinophils Absolute: 0 10*3/uL (ref 0.0–0.5)
Eosinophils Relative: 1 %
HCT: 34.6 % — ABNORMAL LOW (ref 39.0–52.0)
Hemoglobin: 11.8 g/dL — ABNORMAL LOW (ref 13.0–17.0)
Immature Granulocytes: 0 %
Lymphocytes Relative: 30 %
Lymphs Abs: 1.2 10*3/uL (ref 0.7–4.0)
MCH: 34.1 pg — ABNORMAL HIGH (ref 26.0–34.0)
MCHC: 34.1 g/dL (ref 30.0–36.0)
MCV: 100 fL (ref 80.0–100.0)
Monocytes Absolute: 0.3 10*3/uL (ref 0.1–1.0)
Monocytes Relative: 8 %
Neutro Abs: 2.5 10*3/uL (ref 1.7–7.7)
Neutrophils Relative %: 61 %
Platelet Count: 185 10*3/uL (ref 150–400)
RBC: 3.46 MIL/uL — ABNORMAL LOW (ref 4.22–5.81)
RDW: 12.2 % (ref 11.5–15.5)
WBC Count: 4.1 10*3/uL (ref 4.0–10.5)
nRBC: 0 % (ref 0.0–0.2)

## 2021-01-26 LAB — CMP (CANCER CENTER ONLY)
ALT: 31 U/L (ref 0–44)
AST: 19 U/L (ref 15–41)
Albumin: 4 g/dL (ref 3.5–5.0)
Alkaline Phosphatase: 80 U/L (ref 38–126)
Anion gap: 7 (ref 5–15)
BUN: 14 mg/dL (ref 8–23)
CO2: 25 mmol/L (ref 22–32)
Calcium: 9 mg/dL (ref 8.9–10.3)
Chloride: 104 mmol/L (ref 98–111)
Creatinine: 0.79 mg/dL (ref 0.61–1.24)
GFR, Estimated: >60 mL/min (ref >60)
Glucose, Bld: 106 mg/dL — ABNORMAL HIGH (ref 70–99)
Potassium: 4.4 mmol/L (ref 3.5–5.1)
Sodium: 136 mmol/L (ref 135–145)
Total Bilirubin: 0.5 mg/dL (ref 0.3–1.2)
Total Protein: 7.4 g/dL (ref 6.5–8.1)

## 2021-01-26 MED ORDER — DEXTROSE 5 % IV SOLN
56.0000 mg/m2 | Freq: Once | INTRAVENOUS | Status: AC
  Administered 2021-01-26: 110 mg via INTRAVENOUS
  Filled 2021-01-26: qty 10

## 2021-01-26 MED ORDER — HEPARIN SOD (PORK) LOCK FLUSH 100 UNIT/ML IV SOLN
500.0000 [IU] | Freq: Once | INTRAVENOUS | Status: AC | PRN
  Administered 2021-01-26: 500 [IU]
  Filled 2021-01-26: qty 5

## 2021-01-26 MED ORDER — SODIUM CHLORIDE 0.9 % IV SOLN
40.0000 mg | Freq: Once | INTRAVENOUS | Status: AC
  Administered 2021-01-26: 40 mg via INTRAVENOUS
  Filled 2021-01-26: qty 4

## 2021-01-26 MED ORDER — SODIUM CHLORIDE 0.9 % IV SOLN
Freq: Once | INTRAVENOUS | Status: AC
  Filled 2021-01-26: qty 250

## 2021-01-26 MED ORDER — SODIUM CHLORIDE 0.9% FLUSH
10.0000 mL | INTRAVENOUS | Status: DC | PRN
  Administered 2021-01-26: 10 mL
  Filled 2021-01-26: qty 10

## 2021-01-26 NOTE — Patient Instructions (Signed)
[facility]  Discharge Instructions: Thank you for choosing [facility] to provide your oncology and hematology care.   If you have a lab appointment with the Cancer Center, please go directly to the Cancer Center and check in at the registration area.   Wear comfortable clothing and clothing appropriate for easy access to any Portacath or PICC line.   We strive to give you quality time with your provider. You may need to reschedule your appointment if you arrive late (15 or more minutes).  Arriving late affects you and other patients whose appointments are after yours.  Also, if you miss three or more appointments without notifying the office, you may be dismissed from the clinic at the provider's discretion.      For prescription refill requests, have your pharmacy contact our office and allow 72 hours for refills to be completed.    Today you received the following chemotherapy and/or immunotherapy agents: Kyprolis    To help prevent nausea and vomiting after your treatment, we encourage you to take your nausea medication as directed.  BELOW ARE SYMPTOMS THAT SHOULD BE REPORTED IMMEDIATELY: . *FEVER GREATER THAN 100.4 F (38 C) OR HIGHER . *CHILLS OR SWEATING . *NAUSEA AND VOMITING THAT IS NOT CONTROLLED WITH YOUR NAUSEA MEDICATION . *UNUSUAL SHORTNESS OF BREATH . *UNUSUAL BRUISING OR BLEEDING . *URINARY PROBLEMS (pain or burning when urinating, or frequent urination) . *BOWEL PROBLEMS (unusual diarrhea, constipation, pain near the anus) . TENDERNESS IN MOUTH AND THROAT WITH OR WITHOUT PRESENCE OF ULCERS (sore throat, sores in mouth, or a toothache) . UNUSUAL RASH, SWELLING OR PAIN  . UNUSUAL VAGINAL DISCHARGE OR ITCHING   Items with * indicate a potential emergency and should be followed up as soon as possible or go to the Emergency Department if any problems should occur.  Please show the CHEMOTHERAPY ALERT CARD or IMMUNOTHERAPY ALERT  CARD at check-in to the Emergency Department and triage nurse.  Should you have questions after your visit or need to cancel or reschedule your appointment, please contact [facility]  Dept: [phone]  and follow the prompts.  Office hours are 8:00 a.m. to 4:30 p.m. Monday - Friday. Please note that voicemails left after 4:00 p.m. may not be returned until the following business day.  We are closed weekends and major holidays. You have access to a nurse at all times for urgent questions. Please call the main number to the clinic Dept: [phone] and follow the prompts.   For any non-urgent questions, you may also contact your provider using MyChart. We now offer e-Visits for anyone 18 and older to request care online for non-urgent symptoms. For details visit [email or web address].   Also download the MyChart app! Go to the app store, search "MyChart", open the app, select [facility], and log in with your MyChart username and password.  Due to Covid, a mask is required upon entering the hospital/clinic. If you do not have a mask, one will be given to you upon arrival. For doctor visits, patients may have 1 support person aged 18 or older with them. For treatment visits, patients cannot have anyone with them due to current Covid guidelines and our immunocompromised population.   

## 2021-01-27 ENCOUNTER — Other Ambulatory Visit: Payer: Self-pay | Admitting: Oncology

## 2021-01-27 ENCOUNTER — Other Ambulatory Visit (HOSPITAL_COMMUNITY): Payer: Self-pay

## 2021-01-29 ENCOUNTER — Encounter: Payer: Self-pay | Admitting: Oncology

## 2021-01-29 ENCOUNTER — Other Ambulatory Visit (HOSPITAL_COMMUNITY): Payer: Self-pay

## 2021-01-29 MED ORDER — LIDOCAINE-PRILOCAINE 2.5-2.5 % EX CREA
TOPICAL_CREAM | CUTANEOUS | 2 refills | Status: DC
  Filled 2021-01-29: qty 30, 10d supply, fill #0
  Filled 2021-07-23 – 2021-08-01 (×2): qty 30, 10d supply, fill #1

## 2021-02-01 ENCOUNTER — Other Ambulatory Visit (HOSPITAL_COMMUNITY): Payer: Self-pay

## 2021-02-02 ENCOUNTER — Inpatient Hospital Stay: Payer: Medicare Other

## 2021-02-02 ENCOUNTER — Other Ambulatory Visit: Payer: Self-pay

## 2021-02-02 VITALS — BP 169/96 | HR 67 | Temp 99.3°F | Resp 18

## 2021-02-02 DIAGNOSIS — C9002 Multiple myeloma in relapse: Secondary | ICD-10-CM | POA: Diagnosis not present

## 2021-02-02 DIAGNOSIS — C9001 Multiple myeloma in remission: Secondary | ICD-10-CM

## 2021-02-02 DIAGNOSIS — C61 Malignant neoplasm of prostate: Secondary | ICD-10-CM

## 2021-02-02 DIAGNOSIS — Z5112 Encounter for antineoplastic immunotherapy: Secondary | ICD-10-CM | POA: Diagnosis not present

## 2021-02-02 DIAGNOSIS — Z95828 Presence of other vascular implants and grafts: Secondary | ICD-10-CM

## 2021-02-02 LAB — CMP (CANCER CENTER ONLY)
ALT: 24 U/L (ref 0–44)
AST: 17 U/L (ref 15–41)
Albumin: 4.2 g/dL (ref 3.5–5.0)
Alkaline Phosphatase: 74 U/L (ref 38–126)
Anion gap: 10 (ref 5–15)
BUN: 15 mg/dL (ref 8–23)
CO2: 25 mmol/L (ref 22–32)
Calcium: 9.4 mg/dL (ref 8.9–10.3)
Chloride: 104 mmol/L (ref 98–111)
Creatinine: 0.78 mg/dL (ref 0.61–1.24)
GFR, Estimated: >60 mL/min (ref >60)
Glucose, Bld: 89 mg/dL (ref 70–99)
Potassium: 4.3 mmol/L (ref 3.5–5.1)
Sodium: 139 mmol/L (ref 135–145)
Total Bilirubin: 0.6 mg/dL (ref 0.3–1.2)
Total Protein: 7.6 g/dL (ref 6.5–8.1)

## 2021-02-02 LAB — CBC WITH DIFFERENTIAL (CANCER CENTER ONLY)
Abs Immature Granulocytes: 0 10*3/uL (ref 0.00–0.07)
Basophils Absolute: 0 10*3/uL (ref 0.0–0.1)
Basophils Relative: 0 %
Eosinophils Absolute: 0 10*3/uL (ref 0.0–0.5)
Eosinophils Relative: 0 %
HCT: 36.1 % — ABNORMAL LOW (ref 39.0–52.0)
Hemoglobin: 12.3 g/dL — ABNORMAL LOW (ref 13.0–17.0)
Immature Granulocytes: 0 %
Lymphocytes Relative: 25 %
Lymphs Abs: 1.2 10*3/uL (ref 0.7–4.0)
MCH: 33.6 pg (ref 26.0–34.0)
MCHC: 34.1 g/dL (ref 30.0–36.0)
MCV: 98.6 fL (ref 80.0–100.0)
Monocytes Absolute: 0.3 10*3/uL (ref 0.1–1.0)
Monocytes Relative: 7 %
Neutro Abs: 3.2 10*3/uL (ref 1.7–7.7)
Neutrophils Relative %: 68 %
Platelet Count: 169 10*3/uL (ref 150–400)
RBC: 3.66 MIL/uL — ABNORMAL LOW (ref 4.22–5.81)
RDW: 12.2 % (ref 11.5–15.5)
WBC Count: 4.7 10*3/uL (ref 4.0–10.5)
nRBC: 0 % (ref 0.0–0.2)

## 2021-02-02 MED ORDER — SODIUM CHLORIDE 0.9 % IV SOLN
40.0000 mg | Freq: Once | INTRAVENOUS | Status: AC
  Administered 2021-02-02: 40 mg via INTRAVENOUS
  Filled 2021-02-02: qty 4

## 2021-02-02 MED ORDER — SODIUM CHLORIDE 0.9% FLUSH
10.0000 mL | INTRAVENOUS | Status: DC | PRN
  Administered 2021-02-02: 10 mL
  Filled 2021-02-02: qty 10

## 2021-02-02 MED ORDER — DIPHENHYDRAMINE HCL 25 MG PO CAPS
ORAL_CAPSULE | ORAL | Status: AC
  Filled 2021-02-02: qty 2

## 2021-02-02 MED ORDER — SODIUM CHLORIDE 0.9% FLUSH
10.0000 mL | INTRAVENOUS | Status: DC | PRN
Start: 2021-02-02 — End: 2021-02-02
  Administered 2021-02-02: 10 mL
  Filled 2021-02-02: qty 10

## 2021-02-02 MED ORDER — ACETAMINOPHEN 325 MG PO TABS
650.0000 mg | ORAL_TABLET | Freq: Once | ORAL | Status: AC
  Administered 2021-02-02: 650 mg via ORAL

## 2021-02-02 MED ORDER — DEXTROSE 5 % IV SOLN
56.0000 mg/m2 | Freq: Once | INTRAVENOUS | Status: AC
  Administered 2021-02-02: 110 mg via INTRAVENOUS
  Filled 2021-02-02: qty 30

## 2021-02-02 MED ORDER — ACETAMINOPHEN 325 MG PO TABS
ORAL_TABLET | ORAL | Status: AC
  Filled 2021-02-02: qty 2

## 2021-02-02 MED ORDER — DARATUMUMAB-HYALURONIDASE-FIHJ 1800-30000 MG-UT/15ML ~~LOC~~ SOLN
1800.0000 mg | Freq: Once | SUBCUTANEOUS | Status: AC
  Administered 2021-02-02: 1800 mg via SUBCUTANEOUS
  Filled 2021-02-02: qty 15

## 2021-02-02 MED ORDER — DIPHENHYDRAMINE HCL 25 MG PO CAPS
50.0000 mg | ORAL_CAPSULE | Freq: Once | ORAL | Status: AC
  Administered 2021-02-02: 50 mg via ORAL

## 2021-02-02 MED ORDER — HEPARIN SOD (PORK) LOCK FLUSH 100 UNIT/ML IV SOLN
500.0000 [IU] | Freq: Once | INTRAVENOUS | Status: AC | PRN
  Administered 2021-02-02: 500 [IU]
  Filled 2021-02-02: qty 5

## 2021-02-02 MED ORDER — SODIUM CHLORIDE 0.9 % IV SOLN
Freq: Once | INTRAVENOUS | Status: AC
Start: 2021-02-02 — End: 2021-02-02
  Filled 2021-02-02: qty 250

## 2021-02-02 NOTE — Patient Instructions (Signed)
[facility]  Discharge Instructions: Thank you for choosing [facility] to provide your oncology and hematology care.   If you have a lab appointment with the [facility], please go directly to the [facility] and check in at the registration area.   Wear comfortable clothing and clothing appropriate for easy access to any Portacath or PICC line.   We strive to give you quality time with your provider. You may need to reschedule your appointment if you arrive late (15 or more minutes).  Arriving late affects you and other patients whose appointments are after yours.  Also, if you miss three or more appointments without notifying the office, you may be dismissed from the clinic at the provider's discretion.      For prescription refill requests, have your pharmacy contact our office and allow 72 hours for refills to be completed.    Today you received the following chemotherapy and/or immunotherapy agents : Kyprolis, Darzalex   To help prevent nausea and vomiting after your treatment, we encourage you to take your nausea medication as directed.  BELOW ARE SYMPTOMS THAT SHOULD BE REPORTED IMMEDIATELY: *FEVER GREATER THAN 100.4 F (38 C) OR HIGHER *CHILLS OR SWEATING *NAUSEA AND VOMITING THAT IS NOT CONTROLLED WITH YOUR NAUSEA MEDICATION *UNUSUAL SHORTNESS OF BREATH *UNUSUAL BRUISING OR BLEEDING *URINARY PROBLEMS (pain or burning when urinating, or frequent urination) *BOWEL PROBLEMS (unusual diarrhea, constipation, pain near the anus) TENDERNESS IN MOUTH AND THROAT WITH OR WITHOUT PRESENCE OF ULCERS (sore throat, sores in mouth, or a toothache) UNUSUAL RASH, SWELLING OR PAIN  UNUSUAL VAGINAL DISCHARGE OR ITCHING   Items with * indicate a potential emergency and should be followed up as soon as possible or go to the Emergency Department if any problems should occur.  Please show the CHEMOTHERAPY ALERT CARD or IMMUNOTHERAPY ALERT CARD at  check-in to the Emergency Department and triage nurse.  Should you have questions after your visit or need to cancel or reschedule your appointment, please contact [facility]  Dept: [phone]  and follow the prompts.  Office hours are 8:00 a.m. to 4:30 p.m. Monday - Friday. Please note that voicemails left after 4:00 p.m. may not be returned until the following business day.  We are closed weekends and major holidays. You have access to a nurse at all times for urgent questions. Please call the main number to the clinic Dept: [phone] and follow the prompts.   For any non-urgent questions, you may also contact your provider using MyChart. We now offer e-Visits for anyone 37 and older to request care online for non-urgent symptoms. For details visit [email or web address].   Also download the MyChart app! Go to the app store, search "MyChart", open the app, select [facility], and log in with your MyChart username and password.  Due to Covid, a mask is required upon entering the hospital/clinic. If you do not have a mask, one will be given to you upon arrival. For doctor visits, patients may have 1 support person aged 8 or older with them. For treatment visits, patients cannot have anyone with them due to current Covid guidelines and our immunocompromised population.

## 2021-02-06 ENCOUNTER — Telehealth: Payer: Self-pay | Admitting: Oncology

## 2021-02-06 NOTE — Telephone Encounter (Signed)
Called patient regarding cancelled provider visit on 07/29, patient has been called and notified.

## 2021-02-09 ENCOUNTER — Inpatient Hospital Stay: Payer: Medicare Other

## 2021-02-09 ENCOUNTER — Other Ambulatory Visit: Payer: Medicare Other

## 2021-02-09 ENCOUNTER — Other Ambulatory Visit: Payer: Self-pay

## 2021-02-09 ENCOUNTER — Ambulatory Visit: Payer: Medicare Other | Admitting: Oncology

## 2021-02-09 VITALS — BP 164/83 | HR 74 | Temp 98.7°F | Resp 18 | Wt 158.4 lb

## 2021-02-09 DIAGNOSIS — C61 Malignant neoplasm of prostate: Secondary | ICD-10-CM

## 2021-02-09 DIAGNOSIS — Z5112 Encounter for antineoplastic immunotherapy: Secondary | ICD-10-CM | POA: Diagnosis not present

## 2021-02-09 DIAGNOSIS — Z95828 Presence of other vascular implants and grafts: Secondary | ICD-10-CM

## 2021-02-09 DIAGNOSIS — C9002 Multiple myeloma in relapse: Secondary | ICD-10-CM | POA: Diagnosis not present

## 2021-02-09 DIAGNOSIS — C9001 Multiple myeloma in remission: Secondary | ICD-10-CM

## 2021-02-09 LAB — CBC WITH DIFFERENTIAL (CANCER CENTER ONLY)
Abs Immature Granulocytes: 0.01 10*3/uL (ref 0.00–0.07)
Basophils Absolute: 0 10*3/uL (ref 0.0–0.1)
Basophils Relative: 0 %
Eosinophils Absolute: 0 10*3/uL (ref 0.0–0.5)
Eosinophils Relative: 0 %
HCT: 35 % — ABNORMAL LOW (ref 39.0–52.0)
Hemoglobin: 12 g/dL — ABNORMAL LOW (ref 13.0–17.0)
Immature Granulocytes: 0 %
Lymphocytes Relative: 24 %
Lymphs Abs: 1.2 10*3/uL (ref 0.7–4.0)
MCH: 33.7 pg (ref 26.0–34.0)
MCHC: 34.3 g/dL (ref 30.0–36.0)
MCV: 98.3 fL (ref 80.0–100.0)
Monocytes Absolute: 0.4 10*3/uL (ref 0.1–1.0)
Monocytes Relative: 8 %
Neutro Abs: 3.3 10*3/uL (ref 1.7–7.7)
Neutrophils Relative %: 68 %
Platelet Count: 144 10*3/uL — ABNORMAL LOW (ref 150–400)
RBC: 3.56 MIL/uL — ABNORMAL LOW (ref 4.22–5.81)
RDW: 12.1 % (ref 11.5–15.5)
WBC Count: 5 10*3/uL (ref 4.0–10.5)
nRBC: 0 % (ref 0.0–0.2)

## 2021-02-09 LAB — CMP (CANCER CENTER ONLY)
ALT: 23 U/L (ref 0–44)
AST: 17 U/L (ref 15–41)
Albumin: 3.9 g/dL (ref 3.5–5.0)
Alkaline Phosphatase: 73 U/L (ref 38–126)
Anion gap: 8 (ref 5–15)
BUN: 14 mg/dL (ref 8–23)
CO2: 25 mmol/L (ref 22–32)
Calcium: 9.3 mg/dL (ref 8.9–10.3)
Chloride: 106 mmol/L (ref 98–111)
Creatinine: 0.87 mg/dL (ref 0.61–1.24)
GFR, Estimated: >60 mL/min (ref >60)
Glucose, Bld: 113 mg/dL — ABNORMAL HIGH (ref 70–99)
Potassium: 4.1 mmol/L (ref 3.5–5.1)
Sodium: 139 mmol/L (ref 135–145)
Total Bilirubin: 0.5 mg/dL (ref 0.3–1.2)
Total Protein: 7.1 g/dL (ref 6.5–8.1)

## 2021-02-09 MED ORDER — DEXTROSE 5 % IV SOLN
56.0000 mg/m2 | Freq: Once | INTRAVENOUS | Status: AC
  Administered 2021-02-09: 110 mg via INTRAVENOUS
  Filled 2021-02-09: qty 30

## 2021-02-09 MED ORDER — SODIUM CHLORIDE 0.9 % IV SOLN
Freq: Once | INTRAVENOUS | Status: AC
  Filled 2021-02-09: qty 250

## 2021-02-09 MED ORDER — SODIUM CHLORIDE 0.9% FLUSH
10.0000 mL | INTRAVENOUS | Status: DC | PRN
  Administered 2021-02-09: 10 mL
  Filled 2021-02-09: qty 10

## 2021-02-09 MED ORDER — SODIUM CHLORIDE 0.9 % IV SOLN
40.0000 mg | Freq: Once | INTRAVENOUS | Status: AC
  Administered 2021-02-09: 40 mg via INTRAVENOUS
  Filled 2021-02-09: qty 4

## 2021-02-09 MED ORDER — HEPARIN SOD (PORK) LOCK FLUSH 100 UNIT/ML IV SOLN
500.0000 [IU] | Freq: Once | INTRAVENOUS | Status: AC | PRN
  Administered 2021-02-09: 500 [IU]
  Filled 2021-02-09: qty 5

## 2021-02-09 NOTE — Patient Instructions (Signed)
[facility]  Discharge Instructions: Thank you for choosing [facility] to provide your oncology and hematology care.   If you have a lab appointment with the Cancer Center, please go directly to the Cancer Center and check in at the registration area.   Wear comfortable clothing and clothing appropriate for easy access to any Portacath or PICC line.   We strive to give you quality time with your provider. You may need to reschedule your appointment if you arrive late (15 or more minutes).  Arriving late affects you and other patients whose appointments are after yours.  Also, if you miss three or more appointments without notifying the office, you may be dismissed from the clinic at the provider's discretion.      For prescription refill requests, have your pharmacy contact our office and allow 72 hours for refills to be completed.    Today you received the following chemotherapy and/or immunotherapy agents: Kyprolis    To help prevent nausea and vomiting after your treatment, we encourage you to take your nausea medication as directed.  BELOW ARE SYMPTOMS THAT SHOULD BE REPORTED IMMEDIATELY: . *FEVER GREATER THAN 100.4 F (38 C) OR HIGHER . *CHILLS OR SWEATING . *NAUSEA AND VOMITING THAT IS NOT CONTROLLED WITH YOUR NAUSEA MEDICATION . *UNUSUAL SHORTNESS OF BREATH . *UNUSUAL BRUISING OR BLEEDING . *URINARY PROBLEMS (pain or burning when urinating, or frequent urination) . *BOWEL PROBLEMS (unusual diarrhea, constipation, pain near the anus) . TENDERNESS IN MOUTH AND THROAT WITH OR WITHOUT PRESENCE OF ULCERS (sore throat, sores in mouth, or a toothache) . UNUSUAL RASH, SWELLING OR PAIN  . UNUSUAL VAGINAL DISCHARGE OR ITCHING   Items with * indicate a potential emergency and should be followed up as soon as possible or go to the Emergency Department if any problems should occur.  Please show the CHEMOTHERAPY ALERT CARD or IMMUNOTHERAPY ALERT  CARD at check-in to the Emergency Department and triage nurse.  Should you have questions after your visit or need to cancel or reschedule your appointment, please contact [facility]  Dept: [phone]  and follow the prompts.  Office hours are 8:00 a.m. to 4:30 p.m. Monday - Friday. Please note that voicemails left after 4:00 p.m. may not be returned until the following business day.  We are closed weekends and major holidays. You have access to a nurse at all times for urgent questions. Please call the main number to the clinic Dept: [phone] and follow the prompts.   For any non-urgent questions, you may also contact your provider using MyChart. We now offer e-Visits for anyone 18 and older to request care online for non-urgent symptoms. For details visit [email or web address].   Also download the MyChart app! Go to the app store, search "MyChart", open the app, select [facility], and log in with your MyChart username and password.  Due to Covid, a mask is required upon entering the hospital/clinic. If you do not have a mask, one will be given to you upon arrival. For doctor visits, patients may have 1 support person aged 18 or older with them. For treatment visits, patients cannot have anyone with them due to current Covid guidelines and our immunocompromised population.   

## 2021-02-12 ENCOUNTER — Other Ambulatory Visit (HOSPITAL_COMMUNITY): Payer: Self-pay

## 2021-02-12 MED ORDER — OXYCODONE-ACETAMINOPHEN 10-325 MG PO TABS
ORAL_TABLET | ORAL | 0 refills | Status: DC
  Filled 2021-02-12: qty 60, 30d supply, fill #0

## 2021-02-16 ENCOUNTER — Inpatient Hospital Stay: Payer: Medicare Other | Attending: Oncology

## 2021-02-16 ENCOUNTER — Telehealth: Payer: Self-pay

## 2021-02-16 ENCOUNTER — Inpatient Hospital Stay: Payer: Medicare Other

## 2021-02-16 ENCOUNTER — Other Ambulatory Visit: Payer: Self-pay

## 2021-02-16 VITALS — BP 157/90 | HR 70 | Temp 98.9°F | Resp 20 | Wt 158.8 lb

## 2021-02-16 DIAGNOSIS — C9001 Multiple myeloma in remission: Secondary | ICD-10-CM

## 2021-02-16 DIAGNOSIS — C61 Malignant neoplasm of prostate: Secondary | ICD-10-CM

## 2021-02-16 DIAGNOSIS — Z95828 Presence of other vascular implants and grafts: Secondary | ICD-10-CM

## 2021-02-16 DIAGNOSIS — Z5112 Encounter for antineoplastic immunotherapy: Secondary | ICD-10-CM | POA: Diagnosis not present

## 2021-02-16 DIAGNOSIS — Z8546 Personal history of malignant neoplasm of prostate: Secondary | ICD-10-CM | POA: Insufficient documentation

## 2021-02-16 DIAGNOSIS — C9002 Multiple myeloma in relapse: Secondary | ICD-10-CM | POA: Diagnosis not present

## 2021-02-16 LAB — CBC WITH DIFFERENTIAL (CANCER CENTER ONLY)
Abs Immature Granulocytes: 0.01 10*3/uL (ref 0.00–0.07)
Basophils Absolute: 0 10*3/uL (ref 0.0–0.1)
Basophils Relative: 0 %
Eosinophils Absolute: 0 10*3/uL (ref 0.0–0.5)
Eosinophils Relative: 0 %
HCT: 33.3 % — ABNORMAL LOW (ref 39.0–52.0)
Hemoglobin: 11.7 g/dL — ABNORMAL LOW (ref 13.0–17.0)
Immature Granulocytes: 0 %
Lymphocytes Relative: 22 %
Lymphs Abs: 1.1 10*3/uL (ref 0.7–4.0)
MCH: 34 pg (ref 26.0–34.0)
MCHC: 35.1 g/dL (ref 30.0–36.0)
MCV: 96.8 fL (ref 80.0–100.0)
Monocytes Absolute: 0.4 10*3/uL (ref 0.1–1.0)
Monocytes Relative: 9 %
Neutro Abs: 3.5 10*3/uL (ref 1.7–7.7)
Neutrophils Relative %: 69 %
Platelet Count: 157 10*3/uL (ref 150–400)
RBC: 3.44 MIL/uL — ABNORMAL LOW (ref 4.22–5.81)
RDW: 12.4 % (ref 11.5–15.5)
WBC Count: 5 10*3/uL (ref 4.0–10.5)
nRBC: 0 % (ref 0.0–0.2)

## 2021-02-16 LAB — CMP (CANCER CENTER ONLY)
ALT: 386 U/L (ref 0–44)
AST: 123 U/L — ABNORMAL HIGH (ref 15–41)
Albumin: 3.9 g/dL (ref 3.5–5.0)
Alkaline Phosphatase: 230 U/L — ABNORMAL HIGH (ref 38–126)
Anion gap: 8 (ref 5–15)
BUN: 10 mg/dL (ref 8–23)
CO2: 25 mmol/L (ref 22–32)
Calcium: 9.1 mg/dL (ref 8.9–10.3)
Chloride: 104 mmol/L (ref 98–111)
Creatinine: 0.98 mg/dL (ref 0.61–1.24)
GFR, Estimated: >60 mL/min (ref >60)
Glucose, Bld: 114 mg/dL — ABNORMAL HIGH (ref 70–99)
Potassium: 4.1 mmol/L (ref 3.5–5.1)
Sodium: 137 mmol/L (ref 135–145)
Total Bilirubin: 0.4 mg/dL (ref 0.3–1.2)
Total Protein: 7.2 g/dL (ref 6.5–8.1)

## 2021-02-16 MED ORDER — SODIUM CHLORIDE 0.9 % IV SOLN
Freq: Once | INTRAVENOUS | Status: AC
Start: 2021-02-16 — End: 2021-02-16
  Filled 2021-02-16: qty 250

## 2021-02-16 MED ORDER — DIPHENHYDRAMINE HCL 25 MG PO CAPS
ORAL_CAPSULE | ORAL | Status: AC
  Filled 2021-02-16: qty 2

## 2021-02-16 MED ORDER — SODIUM CHLORIDE 0.9% FLUSH
10.0000 mL | INTRAVENOUS | Status: DC | PRN
  Administered 2021-02-16: 10 mL
  Filled 2021-02-16: qty 10

## 2021-02-16 MED ORDER — DARATUMUMAB-HYALURONIDASE-FIHJ 1800-30000 MG-UT/15ML ~~LOC~~ SOLN
1800.0000 mg | Freq: Once | SUBCUTANEOUS | Status: AC
  Administered 2021-02-16: 1800 mg via SUBCUTANEOUS
  Filled 2021-02-16: qty 15

## 2021-02-16 MED ORDER — SODIUM CHLORIDE 0.9 % IV SOLN
40.0000 mg | Freq: Once | INTRAVENOUS | Status: AC
  Administered 2021-02-16: 40 mg via INTRAVENOUS
  Filled 2021-02-16: qty 4

## 2021-02-16 MED ORDER — HEPARIN SOD (PORK) LOCK FLUSH 100 UNIT/ML IV SOLN
500.0000 [IU] | Freq: Once | INTRAVENOUS | Status: AC | PRN
  Administered 2021-02-16: 500 [IU]
  Filled 2021-02-16: qty 5

## 2021-02-16 MED ORDER — ACETAMINOPHEN 325 MG PO TABS
ORAL_TABLET | ORAL | Status: AC
  Filled 2021-02-16: qty 2

## 2021-02-16 MED ORDER — DIPHENHYDRAMINE HCL 25 MG PO CAPS
50.0000 mg | ORAL_CAPSULE | Freq: Once | ORAL | Status: AC
  Administered 2021-02-16: 50 mg via ORAL

## 2021-02-16 NOTE — Patient Instructions (Addendum)
[facility]  Discharge Instructions: °Thank you for choosing [facility] to provide your oncology and hematology care.  ° °If you have a lab appointment with the Cancer Center, please go directly to the Cancer Center and check in at the registration area. °  °Wear comfortable clothing and clothing appropriate for easy access to any Portacath or PICC line.  ° °We strive to give you quality time with your provider. You may need to reschedule your appointment if you arrive late (15 or more minutes).  Arriving late affects you and other patients whose appointments are after yours.  Also, if you miss three or more appointments without notifying the office, you may be dismissed from the clinic at the provider’s discretion.    °  °For prescription refill requests, have your pharmacy contact our office and allow 72 hours for refills to be completed.   ° °Today you received the following chemotherapy and/or immunotherapy agents: Darzalex  °  °To help prevent nausea and vomiting after your treatment, we encourage you to take your nausea medication as directed. ° °BELOW ARE SYMPTOMS THAT SHOULD BE REPORTED IMMEDIATELY: °*FEVER GREATER THAN 100.4 F (38 °C) OR HIGHER °*CHILLS OR SWEATING °*NAUSEA AND VOMITING THAT IS NOT CONTROLLED WITH YOUR NAUSEA MEDICATION °*UNUSUAL SHORTNESS OF BREATH °*UNUSUAL BRUISING OR BLEEDING °*URINARY PROBLEMS (pain or burning when urinating, or frequent urination) °*BOWEL PROBLEMS (unusual diarrhea, constipation, pain near the anus) °TENDERNESS IN MOUTH AND THROAT WITH OR WITHOUT PRESENCE OF ULCERS (sore throat, sores in mouth, or a toothache) °UNUSUAL RASH, SWELLING OR PAIN  °UNUSUAL VAGINAL DISCHARGE OR ITCHING  ° °Items with * indicate a potential emergency and should be followed up as soon as possible or go to the Emergency Department if any problems should occur. ° °Please show the CHEMOTHERAPY ALERT CARD or IMMUNOTHERAPY ALERT CARD at check-in to the  Emergency Department and triage nurse. ° °Should you have questions after your visit or need to cancel or reschedule your appointment, please contact [facility]  Dept: [phone]  and follow the prompts.  Office hours are 8:00 a.m. to 4:30 p.m. Monday - Friday. Please note that voicemails left after 4:00 p.m. may not be returned until the following business day.  We are closed weekends and major holidays. You have access to a nurse at all times for urgent questions. Please call the main number to the clinic Dept: [phone] and follow the prompts. ° ° °For any non-urgent questions, you may also contact your provider using MyChart. We now offer e-Visits for anyone 18 and older to request care online for non-urgent symptoms. For details visit [email or web address]. °  °Also download the MyChart app! Go to the app store, search "MyChart", open the app, select [facility], and log in with your MyChart username and password. ° °Due to Covid, a mask is required upon entering the hospital/clinic. If you do not have a mask, one will be given to you upon arrival. For doctor visits, patients may have 1 support person aged 18 or older with them. For treatment visits, patients cannot have anyone with them due to current Covid guidelines and our immunocompromised population.  ° °

## 2021-02-16 NOTE — Progress Notes (Signed)
Per Dr. [name] hold kyprolis today due to labs, OK treat with Faspro today. Hold Tylenol.  Patient instructed to avoid tylenol and alcohol, verbalized understanding

## 2021-02-16 NOTE — Telephone Encounter (Signed)
CRITICAL VALUE STICKER  CRITICAL VALUE: ALT = [phone]  RECEIVER (on-site recipient of call): Yetta Glassman, [place] NOTIFIED: 02/16/21 at 12:20pm  MESSENGER (representative from lab): Ulice Dash  MD NOTIFIED: [name]  TIME OF NOTIFICATION: 02/16/21 at 12:27pm  RESPONSE: Notification given to [facility], [facility] for follow-up.

## 2021-02-19 LAB — KAPPA/LAMBDA LIGHT CHAINS
Kappa free light chain: 13.9 mg/L (ref 3.3–19.4)
Kappa, lambda light chain ratio: 4.79 — ABNORMAL HIGH (ref 0.26–1.65)
Lambda free light chains: 2.9 mg/L — ABNORMAL LOW (ref 5.7–26.3)

## 2021-02-20 LAB — MULTIPLE MYELOMA PANEL, SERUM
Albumin SerPl Elph-Mcnc: 4.1 g/dL (ref 2.9–4.4)
Albumin/Glob SerPl: 1.5 (ref 0.7–1.7)
Alpha 1: 0.3 g/dL (ref 0.0–0.4)
Alpha2 Glob SerPl Elph-Mcnc: 0.8 g/dL (ref 0.4–1.0)
B-Globulin SerPl Elph-Mcnc: 0.9 g/dL (ref 0.7–1.3)
Gamma Glob SerPl Elph-Mcnc: 0.9 g/dL (ref 0.4–1.8)
Globulin, Total: 2.8 g/dL (ref 2.2–3.9)
IgA: 20 mg/dL — ABNORMAL LOW (ref 61–437)
IgG (Immunoglobin G), Serum: 1002 mg/dL (ref 603–1613)
IgM (Immunoglobulin M), Srm: 16 mg/dL — ABNORMAL LOW (ref 20–172)
M Protein SerPl Elph-Mcnc: 0.6 g/dL — ABNORMAL HIGH
Total Protein ELP: 6.9 g/dL (ref 6.0–8.5)

## 2021-02-23 ENCOUNTER — Other Ambulatory Visit: Payer: Self-pay

## 2021-02-23 ENCOUNTER — Inpatient Hospital Stay: Payer: Medicare Other

## 2021-02-23 VITALS — BP 163/90 | HR 72 | Temp 98.6°F | Resp 18 | Wt 158.5 lb

## 2021-02-23 DIAGNOSIS — Z95828 Presence of other vascular implants and grafts: Secondary | ICD-10-CM

## 2021-02-23 DIAGNOSIS — C9001 Multiple myeloma in remission: Secondary | ICD-10-CM

## 2021-02-23 DIAGNOSIS — C61 Malignant neoplasm of prostate: Secondary | ICD-10-CM

## 2021-02-23 DIAGNOSIS — Z5112 Encounter for antineoplastic immunotherapy: Secondary | ICD-10-CM | POA: Diagnosis not present

## 2021-02-23 DIAGNOSIS — C9002 Multiple myeloma in relapse: Secondary | ICD-10-CM | POA: Diagnosis not present

## 2021-02-23 LAB — CBC WITH DIFFERENTIAL (CANCER CENTER ONLY)
Abs Immature Granulocytes: 0.01 10*3/uL (ref 0.00–0.07)
Basophils Absolute: 0 10*3/uL (ref 0.0–0.1)
Basophils Relative: 0 %
Eosinophils Absolute: 0 10*3/uL (ref 0.0–0.5)
Eosinophils Relative: 1 %
HCT: 34.1 % — ABNORMAL LOW (ref 39.0–52.0)
Hemoglobin: 11.4 g/dL — ABNORMAL LOW (ref 13.0–17.0)
Immature Granulocytes: 0 %
Lymphocytes Relative: 36 %
Lymphs Abs: 1.6 10*3/uL (ref 0.7–4.0)
MCH: 33.1 pg (ref 26.0–34.0)
MCHC: 33.4 g/dL (ref 30.0–36.0)
MCV: 99.1 fL (ref 80.0–100.0)
Monocytes Absolute: 0.4 10*3/uL (ref 0.1–1.0)
Monocytes Relative: 9 %
Neutro Abs: 2.4 10*3/uL (ref 1.7–7.7)
Neutrophils Relative %: 54 %
Platelet Count: 234 10*3/uL (ref 150–400)
RBC: 3.44 MIL/uL — ABNORMAL LOW (ref 4.22–5.81)
RDW: 12.2 % (ref 11.5–15.5)
WBC Count: 4.4 10*3/uL (ref 4.0–10.5)
nRBC: 0 % (ref 0.0–0.2)

## 2021-02-23 LAB — CMP (CANCER CENTER ONLY)
ALT: 61 U/L — ABNORMAL HIGH (ref 0–44)
AST: 18 U/L (ref 15–41)
Albumin: 3.7 g/dL (ref 3.5–5.0)
Alkaline Phosphatase: 113 U/L (ref 38–126)
Anion gap: 8 (ref 5–15)
BUN: 16 mg/dL (ref 8–23)
CO2: 24 mmol/L (ref 22–32)
Calcium: 8.9 mg/dL (ref 8.9–10.3)
Chloride: 103 mmol/L (ref 98–111)
Creatinine: 0.99 mg/dL (ref 0.61–1.24)
GFR, Estimated: >60 mL/min (ref >60)
Glucose, Bld: 99 mg/dL (ref 70–99)
Potassium: 4.1 mmol/L (ref 3.5–5.1)
Sodium: 135 mmol/L (ref 135–145)
Total Bilirubin: 0.3 mg/dL (ref 0.3–1.2)
Total Protein: 7 g/dL (ref 6.5–8.1)

## 2021-02-23 MED ORDER — SODIUM CHLORIDE 0.9 % IV SOLN
Freq: Once | INTRAVENOUS | Status: AC
  Filled 2021-02-23: qty 250

## 2021-02-23 MED ORDER — SODIUM CHLORIDE 0.9% FLUSH
10.0000 mL | INTRAVENOUS | Status: DC | PRN
Start: 2021-02-23 — End: 2021-02-23
  Administered 2021-02-23: 10 mL
  Filled 2021-02-23: qty 10

## 2021-02-23 MED ORDER — SODIUM CHLORIDE 0.9 % IV SOLN
40.0000 mg | Freq: Once | INTRAVENOUS | Status: AC
  Administered 2021-02-23: 40 mg via INTRAVENOUS
  Filled 2021-02-23: qty 4

## 2021-02-23 MED ORDER — SODIUM CHLORIDE 0.9% FLUSH
10.0000 mL | INTRAVENOUS | Status: DC | PRN
  Administered 2021-02-23: 10 mL
  Filled 2021-02-23: qty 10

## 2021-02-23 MED ORDER — HEPARIN SOD (PORK) LOCK FLUSH 100 UNIT/ML IV SOLN
500.0000 [IU] | Freq: Once | INTRAVENOUS | Status: AC | PRN
  Administered 2021-02-23: 500 [IU]
  Filled 2021-02-23: qty 5

## 2021-02-23 MED ORDER — DEXTROSE 5 % IV SOLN
56.0000 mg/m2 | Freq: Once | INTRAVENOUS | Status: AC
  Administered 2021-02-23: 110 mg via INTRAVENOUS
  Filled 2021-02-23: qty 30

## 2021-02-23 NOTE — Patient Instructions (Signed)
[facility]  Discharge Instructions: Thank you for choosing [facility] to provide your oncology and hematology care.   If you have a lab appointment with the Cancer Center, please go directly to the Cancer Center and check in at the registration area.   Wear comfortable clothing and clothing appropriate for easy access to any Portacath or PICC line.   We strive to give you quality time with your provider. You may need to reschedule your appointment if you arrive late (15 or more minutes).  Arriving late affects you and other patients whose appointments are after yours.  Also, if you miss three or more appointments without notifying the office, you may be dismissed from the clinic at the provider's discretion.      For prescription refill requests, have your pharmacy contact our office and allow 72 hours for refills to be completed.    Today you received the following chemotherapy and/or immunotherapy agents: Kyprolis    To help prevent nausea and vomiting after your treatment, we encourage you to take your nausea medication as directed.  BELOW ARE SYMPTOMS THAT SHOULD BE REPORTED IMMEDIATELY: . *FEVER GREATER THAN 100.4 F (38 C) OR HIGHER . *CHILLS OR SWEATING . *NAUSEA AND VOMITING THAT IS NOT CONTROLLED WITH YOUR NAUSEA MEDICATION . *UNUSUAL SHORTNESS OF BREATH . *UNUSUAL BRUISING OR BLEEDING . *URINARY PROBLEMS (pain or burning when urinating, or frequent urination) . *BOWEL PROBLEMS (unusual diarrhea, constipation, pain near the anus) . TENDERNESS IN MOUTH AND THROAT WITH OR WITHOUT PRESENCE OF ULCERS (sore throat, sores in mouth, or a toothache) . UNUSUAL RASH, SWELLING OR PAIN  . UNUSUAL VAGINAL DISCHARGE OR ITCHING   Items with * indicate a potential emergency and should be followed up as soon as possible or go to the Emergency Department if any problems should occur.  Please show the CHEMOTHERAPY ALERT CARD or IMMUNOTHERAPY ALERT  CARD at check-in to the Emergency Department and triage nurse.  Should you have questions after your visit or need to cancel or reschedule your appointment, please contact [facility]  Dept: [phone]  and follow the prompts.  Office hours are 8:00 a.m. to 4:30 p.m. Monday - Friday. Please note that voicemails left after 4:00 p.m. may not be returned until the following business day.  We are closed weekends and major holidays. You have access to a nurse at all times for urgent questions. Please call the main number to the clinic Dept: [phone] and follow the prompts.   For any non-urgent questions, you may also contact your provider using MyChart. We now offer e-Visits for anyone 18 and older to request care online for non-urgent symptoms. For details visit [email or web address].   Also download the MyChart app! Go to the app store, search "MyChart", open the app, select [facility], and log in with your MyChart username and password.  Due to Covid, a mask is required upon entering the hospital/clinic. If you do not have a mask, one will be given to you upon arrival. For doctor visits, patients may have 1 support person aged 18 or older with them. For treatment visits, patients cannot have anyone with them due to current Covid guidelines and our immunocompromised population.   

## 2021-03-02 ENCOUNTER — Inpatient Hospital Stay: Payer: Medicare Other

## 2021-03-02 ENCOUNTER — Other Ambulatory Visit: Payer: Self-pay | Admitting: Oncology

## 2021-03-02 ENCOUNTER — Other Ambulatory Visit: Payer: Self-pay

## 2021-03-02 ENCOUNTER — Inpatient Hospital Stay (HOSPITAL_BASED_OUTPATIENT_CLINIC_OR_DEPARTMENT_OTHER): Payer: Medicare Other | Admitting: Oncology

## 2021-03-02 VITALS — BP 165/86 | HR 71 | Temp 98.0°F | Resp 20 | Ht 73.0 in | Wt 159.4 lb

## 2021-03-02 DIAGNOSIS — C9002 Multiple myeloma in relapse: Secondary | ICD-10-CM | POA: Diagnosis not present

## 2021-03-02 DIAGNOSIS — C61 Malignant neoplasm of prostate: Secondary | ICD-10-CM

## 2021-03-02 DIAGNOSIS — C9001 Multiple myeloma in remission: Secondary | ICD-10-CM

## 2021-03-02 DIAGNOSIS — Z95828 Presence of other vascular implants and grafts: Secondary | ICD-10-CM | POA: Diagnosis not present

## 2021-03-02 DIAGNOSIS — Z5112 Encounter for antineoplastic immunotherapy: Secondary | ICD-10-CM | POA: Diagnosis not present

## 2021-03-02 LAB — CMP (CANCER CENTER ONLY)
ALT: 26 U/L (ref 0–44)
AST: 17 U/L (ref 15–41)
Albumin: 3.7 g/dL (ref 3.5–5.0)
Alkaline Phosphatase: 87 U/L (ref 38–126)
Anion gap: 6 (ref 5–15)
BUN: 12 mg/dL (ref 8–23)
CO2: 26 mmol/L (ref 22–32)
Calcium: 8.9 mg/dL (ref 8.9–10.3)
Chloride: 106 mmol/L (ref 98–111)
Creatinine: 0.81 mg/dL (ref 0.61–1.24)
GFR, Estimated: >60 mL/min (ref >60)
Glucose, Bld: 91 mg/dL (ref 70–99)
Potassium: 4.3 mmol/L (ref 3.5–5.1)
Sodium: 138 mmol/L (ref 135–145)
Total Bilirubin: 0.4 mg/dL (ref 0.3–1.2)
Total Protein: 6.7 g/dL (ref 6.5–8.1)

## 2021-03-02 LAB — CBC WITH DIFFERENTIAL (CANCER CENTER ONLY)
Abs Immature Granulocytes: 0.02 10*3/uL (ref 0.00–0.07)
Basophils Absolute: 0 10*3/uL (ref 0.0–0.1)
Basophils Relative: 0 %
Eosinophils Absolute: 0 10*3/uL (ref 0.0–0.5)
Eosinophils Relative: 1 %
HCT: 32.9 % — ABNORMAL LOW (ref 39.0–52.0)
Hemoglobin: 11.2 g/dL — ABNORMAL LOW (ref 13.0–17.0)
Immature Granulocytes: 0 %
Lymphocytes Relative: 27 %
Lymphs Abs: 1.3 10*3/uL (ref 0.7–4.0)
MCH: 33.6 pg (ref 26.0–34.0)
MCHC: 34 g/dL (ref 30.0–36.0)
MCV: 98.8 fL (ref 80.0–100.0)
Monocytes Absolute: 0.5 10*3/uL (ref 0.1–1.0)
Monocytes Relative: 9 %
Neutro Abs: 3 10*3/uL (ref 1.7–7.7)
Neutrophils Relative %: 63 %
Platelet Count: 147 10*3/uL — ABNORMAL LOW (ref 150–400)
RBC: 3.33 MIL/uL — ABNORMAL LOW (ref 4.22–5.81)
RDW: 12.2 % (ref 11.5–15.5)
WBC Count: 4.8 10*3/uL (ref 4.0–10.5)
nRBC: 0 % (ref 0.0–0.2)

## 2021-03-02 MED ORDER — HEPARIN SOD (PORK) LOCK FLUSH 100 UNIT/ML IV SOLN
500.0000 [IU] | Freq: Once | INTRAVENOUS | Status: AC | PRN
  Administered 2021-03-02: 500 [IU]

## 2021-03-02 MED ORDER — SODIUM CHLORIDE 0.9% FLUSH
10.0000 mL | INTRAVENOUS | Status: DC | PRN
  Administered 2021-03-02: 10 mL

## 2021-03-02 MED ORDER — DARATUMUMAB-HYALURONIDASE-FIHJ 1800-30000 MG-UT/15ML ~~LOC~~ SOLN
1800.0000 mg | Freq: Once | SUBCUTANEOUS | Status: AC
  Administered 2021-03-02: 1800 mg via SUBCUTANEOUS
  Filled 2021-03-02: qty 15

## 2021-03-02 MED ORDER — SODIUM CHLORIDE 0.9 % IV SOLN: Freq: Once | INTRAVENOUS | Status: AC

## 2021-03-02 MED ORDER — SODIUM CHLORIDE 0.9 % IV SOLN
40.0000 mg | Freq: Once | INTRAVENOUS | Status: AC
  Administered 2021-03-02: 40 mg via INTRAVENOUS
  Filled 2021-03-02: qty 4

## 2021-03-02 MED ORDER — DEXTROSE 5 % IV SOLN
56.0000 mg/m2 | Freq: Once | INTRAVENOUS | Status: AC
  Administered 2021-03-02: 110 mg via INTRAVENOUS
  Filled 2021-03-02: qty 30

## 2021-03-02 MED ORDER — ACETAMINOPHEN 325 MG PO TABS
650.0000 mg | ORAL_TABLET | Freq: Once | ORAL | Status: AC
  Administered 2021-03-02: 650 mg via ORAL
  Filled 2021-03-02: qty 2

## 2021-03-02 MED ORDER — DIPHENHYDRAMINE HCL 25 MG PO CAPS
50.0000 mg | ORAL_CAPSULE | Freq: Once | ORAL | Status: AC
  Administered 2021-03-02: 50 mg via ORAL
  Filled 2021-03-02: qty 2

## 2021-03-02 NOTE — Progress Notes (Signed)
Hematology and Oncology Follow Up Visit  Travis Nguyen [id number] 23-Sep-1950 70 y.o. 03/02/2021 12:50 PM [name], [name], MD   Principle Diagnosis: 70 year old man with IgG kappa multiple myeloma diagnosed in 2014.  He developed relapsed disease noted January 2022.  [name]  Secondary diagnosis: Stage a T1c, Gleason score 3+4 = 7 prostate cancer that is currently in remission.  Prior Therapy: He was treated initially with Cytoxan, Velcade and Decadron and subsequently his regimen changed to a Velcade, Revlimid with dexamethasone.  He achieved remission at that time.   He is status post a robotic-assisted laparoscopic radical prostatectomy and bilateral lymph node dissection on 02/09/2014. The final pathology showed prostate adenocarcinoma Gleason score 4+3 equals 7 involving both lobes.    Current therapy: Carfilzomib, and dexamethasone, daratumumab started on July 28, 2020.  Dexamethasone and carfilzomib is weekly with daratumumab every 2 weeks.   Interim History:  [name] is here for a follow-up visit.  Since last visit, he reports no major changes in his health.  He continues to tolerate therapy without any major complaints.  He denies any nausea, vomiting or abdominal pain.  He denies any worsening neuropathy or fatigue.  Denies any hospitalization or illnesses.     Medications: Unchanged on review. Current Outpatient Medications  Medication Sig Dispense Refill   acyclovir (ZOVIRAX) 400 MG tablet TAKE 1 TABLET BY MOUTH DAILY. (Patient not taking: No sig reported) 90 tablet 3   amLODipine (NORVASC) 5 MG tablet Take 1 tablet by mouth once a day 90 tablet 1   calcium carbonate (TUMS - DOSED IN MG ELEMENTAL CALCIUM) 500 MG chewable tablet Chew 1 tablet by mouth daily as needed for indigestion or heartburn.     escitalopram (LEXAPRO) 10 MG tablet Take 1 tablet by mouth once a day 90 tablet 1   lidocaine-prilocaine (EMLA) cream APPLY TOPICALLY TO PORT-A-CATH DAILY AS  NEEDED 30 g 2   losartan (COZAAR) 100 MG tablet TAKE 1 TABLET BY MOUTH ONCE A DAY (Patient taking differently: Take 100 mg by mouth daily.) 90 tablet 0   losartan (COZAAR) 100 MG tablet Take 1 tablet by once a day 90 tablet 1   oxyCODONE-acetaminophen (PERCOCET) 10-325 MG tablet TAKE 1 TABLET BY MOUTH 2 TIMES DAILY AS NEEDED DO NOT FILL UNTIL 09/21/20 (Patient not taking: No sig reported) 60 tablet 0   oxyCODONE-acetaminophen (PERCOCET) 10-325 MG tablet Take 1 tablet by mouth 2 times a day (Patient taking differently: Take 1 tablet by mouth 2 (two) times daily as needed for pain.) 60 tablet 0   oxyCODONE-acetaminophen (PERCOCET) 10-325 MG tablet Take 1 tablet by mouth twice a day as needed  (30 days) 60 tablet 0   pantoprazole (PROTONIX) 20 MG tablet Take 1 tablet (20 mg total) by mouth daily. 14 tablet 0   vitamin C (ASCORBIC ACID) 500 MG tablet Take 500 mg by mouth daily.     No current facility-administered medications for this visit.   Facility-Administered Medications Ordered in Other Visits  Medication Dose Route Frequency Provider Last Rate Last Admin   sodium chloride flush (NS) 0.9 % injection 10 mL  10 mL Intracatheter PRN Wyatt Portela, MD   10 mL at 03/02/21 1239     Allergies:  Allergies  Allergen Reactions   Aspirin     Pt stated "raises BP"        Physical Exam:    Blood pressure (!) 165/86, pulse 71, temperature 98 F (36.7 C), temperature source Tympanic, resp. rate  20, height 6' 1"  (1.854 m), weight 159 lb 6.4 oz (72.3 kg), SpO2 100 %.     ECOG: 1   General appearance: Alert, awake without any distress. Head: Atraumatic without abnormalities Oropharynx: Without any thrush or ulcers. Eyes: No scleral icterus. Lymph nodes: No lymphadenopathy noted in the cervical, supraclavicular, or axillary nodes Heart:regular rate and rhythm, without any murmurs or gallops.   Lung: Clear to auscultation without any rhonchi, wheezes or dullness to  percussion. Abdomin: Soft, nontender without any shifting dullness or ascites. Musculoskeletal: No clubbing or cyanosis. Neurological: No motor or sensory deficits. Skin: No rashes or lesions.              Lab Results: Lab Results  Component Value Date   WBC 4.4 02/23/2021   HGB 11.4 (L) 02/23/2021   HCT 34.1 (L) 02/23/2021   MCV 99.1 02/23/2021   PLT 234 02/23/2021     Chemistry      Component Value Date/Time   NA 135 02/23/2021 1145   NA 138 03/27/2017 0838   K 4.1 02/23/2021 1145   K 4.3 03/27/2017 0838   CL 103 02/23/2021 1145   CL 105 01/01/2013 0835   CO2 24 02/23/2021 1145   CO2 24 03/27/2017 0838   BUN 16 02/23/2021 1145   BUN 23.7 03/27/2017 0838   CREATININE 0.99 02/23/2021 1145   CREATININE 1.2 03/27/2017 0838      Component Value Date/Time   CALCIUM 8.9 02/23/2021 1145   CALCIUM 9.9 03/27/2017 0838   ALKPHOS 113 02/23/2021 1145   ALKPHOS 79 03/27/2017 0838   AST 18 02/23/2021 1145   AST 25 03/27/2017 0838   ALT 61 (H) 02/23/2021 1145   ALT 26 03/27/2017 0838   BILITOT 0.3 02/23/2021 1145   BILITOT 0.31 03/27/2017 0838      Results for Travis, Nguyen (MRN [id number]) as of 03/02/2021 12:53  Ref. Range 11/17/2020 11:58 12/07/2020 12:39 01/05/2021 10:14 02/16/2021 11:27  M Protein SerPl Elph-Mcnc Latest Ref Range: Not Observed g/dL 0.7 (H) 0.9 (H) 0.9 (H) 0.6 (H)  IFE 1 Unknown Comment (A) Comment (A) Comment (A) Comment (A)  Globulin, Total Latest Ref Range: 2.2 - 3.9 g/dL 2.7 2.8 2.7 2.8  B-Globulin SerPl Elph-Mcnc Latest Ref Range: 0.7 - 1.3 g/dL 0.8 0.9 0.8 0.9  IgG (Immunoglobin G), Serum Latest Ref Range: 603 - 1,613 mg/dL 1,228 1,203 1,129 1,002  IgM (Immunoglobulin M), Srm Latest Ref Range: 20 - 172 mg/dL 12 (L) 13 (L) 16 (L) 16 (L)  IgA Latest Ref Range: 61 - 437 mg/dL 18 (L) 18 (L) 21 (L) 20 (L)   Results for [name], [name] (MRN [id number]) as of 03/02/2021 12:53  Ref. Range 01/05/2021 10:14 02/16/2021 11:27  Kappa free light chain  Latest Ref Range: 3.3 - 19.4 mg/L 15.1 13.9  Lambda free light chains Latest Ref Range: 5.7 - 26.3 mg/L 3.3 (L) 2.9 (L)  Kappa, lambda light chain ratio Latest Ref Range: 0.26 - 1.65  4.58 (H) 4.79 (H)   Impression and Plan:  70 year old man with:   1.  Relapsed multiple myeloma diagnosed in 2022.  He was found to have IgG kappa at the time of diagnosis.  His disease status was updated at this time and treatment choices were reviewed.  Protein studies obtained on February 16, 2021 were reiterated.  M spike is down to 0.6 g/dL with normalization of his IgG level.  He continues to have elevated kappa to lambda ratio however.  Risks and  benefits of continuing this treatment were discussed at this time.  Complications include neuropathy, fatigue and myelosuppression were reiterated.  At this time he is agreeable to continue.  2.  Prostate cancer: No evidence of relapse at this time.   3.  IV access: Port-A-Cath remains in place but has been that problematic with difficult in accessing.  Will refer to interventional radiology for replacement given the difficulty and the continuous malfunction he is experiencing..   4.  Antiemetics: No nausea or vomiting reported at this time.  Compazine is available to him.   5.  VZV prophylaxis: Acyclovir is available to him without any reactivation.   6. Followup: Weekly for treatment from MD follow-up in 4 weeks.   30  minutes were dedicated to this visit.  Time spent on reviewing laboratory data, disease status update, treatment choices and addressing complications related to cancer and cancer therapy.    Zola Button, MD 8/19/202212:50 PM

## 2021-03-02 NOTE — Patient Instructions (Signed)
[facility]  Discharge Instructions: Thank you for choosing [facility] to provide your oncology and hematology care.   If you have a lab appointment with the [facility], please go directly to the [facility] and check in at the registration area.   Wear comfortable clothing and clothing appropriate for easy access to any Portacath or PICC line.   We strive to give you quality time with your provider. You may need to reschedule your appointment if you arrive late (15 or more minutes).  Arriving late affects you and other patients whose appointments are after yours.  Also, if you miss three or more appointments without notifying the office, you may be dismissed from the clinic at the provider's discretion.      For prescription refill requests, have your pharmacy contact our office and allow 72 hours for refills to be completed.    Today you received the following chemotherapy and/or immunotherapy agents : Kyprolis, Darzalex  Faspro injection    To help prevent nausea and vomiting after your treatment, we encourage you to take your nausea medication as directed.  BELOW ARE SYMPTOMS THAT SHOULD BE REPORTED IMMEDIATELY: *FEVER GREATER THAN 100.4 F (38 C) OR HIGHER *CHILLS OR SWEATING *NAUSEA AND VOMITING THAT IS NOT CONTROLLED WITH YOUR NAUSEA MEDICATION *UNUSUAL SHORTNESS OF BREATH *UNUSUAL BRUISING OR BLEEDING *URINARY PROBLEMS (pain or burning when urinating, or frequent urination) *BOWEL PROBLEMS (unusual diarrhea, constipation, pain near the anus) TENDERNESS IN MOUTH AND THROAT WITH OR WITHOUT PRESENCE OF ULCERS (sore throat, sores in mouth, or a toothache) UNUSUAL RASH, SWELLING OR PAIN  UNUSUAL VAGINAL DISCHARGE OR ITCHING   Items with * indicate a potential emergency and should be followed up as soon as possible or go to the Emergency Department if any problems should occur.  Please show the CHEMOTHERAPY ALERT CARD or IMMUNOTHERAPY  ALERT CARD at check-in to the Emergency Department and triage nurse.  Should you have questions after your visit or need to cancel or reschedule your appointment, please contact [facility]  Dept: [phone]  and follow the prompts.  Office hours are 8:00 a.m. to 4:30 p.m. Monday - Friday. Please note that voicemails left after 4:00 p.m. may not be returned until the following business day.  We are closed weekends and major holidays. You have access to a nurse at all times for urgent questions. Please call the main number to the clinic Dept: [phone] and follow the prompts.   For any non-urgent questions, you may also contact your provider using MyChart. We now offer e-Visits for anyone 43 and older to request care online for non-urgent symptoms. For details visit [email or web address].   Also download the MyChart app! Go to the app store, search "MyChart", open the app, select [facility], and log in with your MyChart username and password.  Due to Covid, a mask is required upon entering the hospital/clinic. If you do not have a mask, one will be given to you upon arrival. For doctor visits, patients may have 1 support person aged 85 or older with them. For treatment visits, patients cannot have anyone with them due to current Covid guidelines and our immunocompromised population.

## 2021-03-09 ENCOUNTER — Inpatient Hospital Stay: Payer: Medicare Other

## 2021-03-09 ENCOUNTER — Other Ambulatory Visit: Payer: Self-pay

## 2021-03-09 VITALS — BP 172/84 | HR 68 | Temp 98.3°F | Resp 18 | Wt 157.8 lb

## 2021-03-09 DIAGNOSIS — Z5112 Encounter for antineoplastic immunotherapy: Secondary | ICD-10-CM | POA: Diagnosis not present

## 2021-03-09 DIAGNOSIS — C61 Malignant neoplasm of prostate: Secondary | ICD-10-CM

## 2021-03-09 DIAGNOSIS — C9002 Multiple myeloma in relapse: Secondary | ICD-10-CM | POA: Diagnosis not present

## 2021-03-09 DIAGNOSIS — C9001 Multiple myeloma in remission: Secondary | ICD-10-CM

## 2021-03-09 DIAGNOSIS — Z95828 Presence of other vascular implants and grafts: Secondary | ICD-10-CM

## 2021-03-09 LAB — CMP (CANCER CENTER ONLY)
ALT: 25 U/L (ref 0–44)
AST: 18 U/L (ref 15–41)
Albumin: 4.1 g/dL (ref 3.5–5.0)
Alkaline Phosphatase: 89 U/L (ref 38–126)
Anion gap: 8 (ref 5–15)
BUN: 12 mg/dL (ref 8–23)
CO2: 25 mmol/L (ref 22–32)
Calcium: 9.3 mg/dL (ref 8.9–10.3)
Chloride: 104 mmol/L (ref 98–111)
Creatinine: 0.81 mg/dL (ref 0.61–1.24)
GFR, Estimated: >60 mL/min (ref >60)
Glucose, Bld: 97 mg/dL (ref 70–99)
Potassium: 4.5 mmol/L (ref 3.5–5.1)
Sodium: 137 mmol/L (ref 135–145)
Total Bilirubin: 0.4 mg/dL (ref 0.3–1.2)
Total Protein: 7.5 g/dL (ref 6.5–8.1)

## 2021-03-09 LAB — CBC WITH DIFFERENTIAL (CANCER CENTER ONLY)
Abs Immature Granulocytes: 0 10*3/uL (ref 0.00–0.07)
Basophils Absolute: 0 10*3/uL (ref 0.0–0.1)
Basophils Relative: 0 %
Eosinophils Absolute: 0 10*3/uL (ref 0.0–0.5)
Eosinophils Relative: 1 %
HCT: 36.2 % — ABNORMAL LOW (ref 39.0–52.0)
Hemoglobin: 12.2 g/dL — ABNORMAL LOW (ref 13.0–17.0)
Immature Granulocytes: 0 %
Lymphocytes Relative: 36 %
Lymphs Abs: 1.5 10*3/uL (ref 0.7–4.0)
MCH: 33.4 pg (ref 26.0–34.0)
MCHC: 33.7 g/dL (ref 30.0–36.0)
MCV: 99.2 fL (ref 80.0–100.0)
Monocytes Absolute: 0.4 10*3/uL (ref 0.1–1.0)
Monocytes Relative: 9 %
Neutro Abs: 2.3 10*3/uL (ref 1.7–7.7)
Neutrophils Relative %: 54 %
Platelet Count: 148 10*3/uL — ABNORMAL LOW (ref 150–400)
RBC: 3.65 MIL/uL — ABNORMAL LOW (ref 4.22–5.81)
RDW: 12.3 % (ref 11.5–15.5)
WBC Count: 4.2 10*3/uL (ref 4.0–10.5)
nRBC: 0 % (ref 0.0–0.2)

## 2021-03-09 MED ORDER — DEXTROSE 5 % IV SOLN
56.0000 mg/m2 | Freq: Once | INTRAVENOUS | Status: AC
  Administered 2021-03-09: 110 mg via INTRAVENOUS
  Filled 2021-03-09: qty 30

## 2021-03-09 MED ORDER — SODIUM CHLORIDE 0.9 % IV SOLN: Freq: Once | INTRAVENOUS | Status: AC

## 2021-03-09 MED ORDER — HEPARIN SOD (PORK) LOCK FLUSH 100 UNIT/ML IV SOLN
500.0000 [IU] | Freq: Once | INTRAVENOUS | Status: AC | PRN
  Administered 2021-03-09: 500 [IU]

## 2021-03-09 MED ORDER — SODIUM CHLORIDE 0.9% FLUSH
10.0000 mL | INTRAVENOUS | Status: DC | PRN
  Administered 2021-03-09: 10 mL

## 2021-03-09 MED ORDER — SODIUM CHLORIDE 0.9 % IV SOLN
40.0000 mg | Freq: Once | INTRAVENOUS | Status: AC
  Administered 2021-03-09: 40 mg via INTRAVENOUS
  Filled 2021-03-09: qty 4

## 2021-03-09 MED ORDER — SODIUM CHLORIDE 0.9% FLUSH
10.0000 mL | INTRAVENOUS | Status: DC | PRN
Start: 2021-03-09 — End: 2021-03-09
  Administered 2021-03-09: 10 mL

## 2021-03-09 NOTE — Patient Instructions (Signed)
[facility]  Discharge Instructions: Thank you for choosing [facility] to provide your oncology and hematology care.   If you have a lab appointment with the [facility], please go directly to the [facility] and check in at the registration area.   Wear comfortable clothing and clothing appropriate for easy access to any Portacath or PICC line.   We strive to give you quality time with your provider. You may need to reschedule your appointment if you arrive late (15 or more minutes).  Arriving late affects you and other patients whose appointments are after yours.  Also, if you miss three or more appointments without notifying the office, you may be dismissed from the clinic at the provider's discretion.      For prescription refill requests, have your pharmacy contact our office and allow 72 hours for refills to be completed.    Today you received the following chemotherapy and/or immunotherapy agents Kyprolis      To help prevent nausea and vomiting after your treatment, we encourage you to take your nausea medication as directed.  BELOW ARE SYMPTOMS THAT SHOULD BE REPORTED IMMEDIATELY: *FEVER GREATER THAN 100.4 F (38 C) OR HIGHER *CHILLS OR SWEATING *NAUSEA AND VOMITING THAT IS NOT CONTROLLED WITH YOUR NAUSEA MEDICATION *UNUSUAL SHORTNESS OF BREATH *UNUSUAL BRUISING OR BLEEDING *URINARY PROBLEMS (pain or burning when urinating, or frequent urination) *BOWEL PROBLEMS (unusual diarrhea, constipation, pain near the anus) TENDERNESS IN MOUTH AND THROAT WITH OR WITHOUT PRESENCE OF ULCERS (sore throat, sores in mouth, or a toothache) UNUSUAL RASH, SWELLING OR PAIN  UNUSUAL VAGINAL DISCHARGE OR ITCHING   Items with * indicate a potential emergency and should be followed up as soon as possible or go to the Emergency Department if any problems should occur.  Please show the CHEMOTHERAPY ALERT CARD or IMMUNOTHERAPY ALERT CARD at check-in to  the Emergency Department and triage nurse.  Should you have questions after your visit or need to cancel or reschedule your appointment, please contact [facility]  Dept: [phone]  and follow the prompts.  Office hours are 8:00 a.m. to 4:30 p.m. Monday - Friday. Please note that voicemails left after 4:00 p.m. may not be returned until the following business day.  We are closed weekends and major holidays. You have access to a nurse at all times for urgent questions. Please call the main number to the clinic Dept: [phone] and follow the prompts.   For any non-urgent questions, you may also contact your provider using MyChart. We now offer e-Visits for anyone 56 and older to request care online for non-urgent symptoms. For details visit [email or web address].   Also download the MyChart app! Go to the app store, search "MyChart", open the app, select [facility], and log in with your MyChart username and password.  Due to Covid, a mask is required upon entering the hospital/clinic. If you do not have a mask, one will be given to you upon arrival. For doctor visits, patients may have 1 support person aged 77 or older with them. For treatment visits, patients cannot have anyone with them due to current Covid guidelines and our immunocompromised population.   Carfilzomib injection What is this medication? CARFILZOMIB (kar FILZ oh mib) targets a specific protein within cancer cellsand stops the cancer cells from growing. It is used to treat multiple myeloma. This medicine may be used for other purposes; ask your health care provider orpharmacist if you have questions.  COMMON BRAND NAME(S): KYPROLIS What should I tell my care team before I take this medication? They need to know if you have any of these conditions: heart disease history of blood clots irregular heartbeat kidney disease liver disease lung or breathing disease an unusual or allergic reaction  to carfilzomib, or other medicines, foods, dyes, or preservatives pregnant or trying to get pregnant breast-feeding How should I use this medication? This medicine is for injection or infusion into a vein. It is given by a healthcare professional in a hospital or clinic setting. Talk to your pediatrician regarding the use of this medicine in children.Special care may be needed. Overdosage: If you think you have taken too much of this medicine contact apoison control center or emergency room at once. NOTE: This medicine is only for you. Do not share this medicine with others. What if I miss a dose? It is important not to miss your dose. Call your doctor or health careprofessional if you are unable to keep an appointment. What may interact with this medication? Interactions are not expected. This list may not describe all possible interactions. Give your health care provider a list of all the medicines, herbs, non-prescription drugs, or dietary supplements you use. Also tell them if you smoke, drink alcohol, or use illegaldrugs. Some items may interact with your medicine. What should I watch for while using this medication? Your condition will be monitored while you are receiving this medicine. You may need blood work done while you are taking this medicine. Do not become pregnant while taking this medicine or for 6 months after stopping it. Women should inform their health care provider if they wish to become pregnant or think they might be pregnant. Men should not father a child while taking this medicine and for 3 months after stopping it. There is a potential for serious side effects to an unborn child. Talk to your health care provider for more information. Do not breast-feed an infant while taking thismedicine or for 2 weeks after stopping it. Check with your health care provider if you have severe diarrhea, nausea, and vomiting, or if you sweat a lot. The loss of too much body fluid may make  itdangerous for you to take this medicine. You may get drowsy or dizzy. Do not drive, use machinery, or do anything that needs mental alertness until you know how this medicine affects you. Do not stand up or sit up quickly, especially if you are an older patient. Thisreduces the risk of dizzy or fainting spells. What side effects may I notice from receiving this medication? Side effects that you should report to your doctor or health care professionalas soon as possible: allergic reactions like skin rash, itching or hives, swelling of the face, lips, or tongue confusion dizziness feeling faint or lightheaded fever or chills palpitations seizures signs and symptoms of bleeding such as bloody or black, tarry stools; red or dark-brown urine; spitting up blood or brown material that looks like coffee grounds; red spots on the skin; unusual bruising or bleeding including from the eye, gums, or nose signs and symptoms of a blood clot such as breathing problems; changes in vision; chest pain; severe, sudden headache; pain, swelling, warmth in the leg; trouble speaking; sudden numbness or weakness of the face, arm or leg signs and symptoms of kidney injury like trouble passing urine or change in the amount of urine signs and symptoms of liver injury like dark yellow or brown urine; general ill feeling or flu-like symptoms;   light-colored stools; loss of appetite; nausea; right upper belly pain; unusually weak or tired; yellowing of the eyes or skin Side effects that usually do not require medical attention (report to yourdoctor or health care professional if they continue or are bothersome): back pain cough diarrhea headache muscle cramps trouble sleeping vomiting This list may not describe all possible side effects. Call your doctor for medical advice about side effects. You may report side effects to FDA at1-800-FDA-1088. Where should I keep my medication? This drug is given in a hospital or  clinic and will not be stored at home. NOTE: This sheet is a summary. It may not cover all possible information. If you have questions about this medicine, talk to your doctor, pharmacist, orhealth care provider.  2022 Elsevier/Gold Standard (2020-07-21 15:24:55)

## 2021-03-12 ENCOUNTER — Other Ambulatory Visit (HOSPITAL_COMMUNITY): Payer: Self-pay

## 2021-03-12 ENCOUNTER — Telehealth: Payer: Self-pay | Admitting: Oncology

## 2021-03-12 MED ORDER — OXYCODONE-ACETAMINOPHEN 10-325 MG PO TABS
ORAL_TABLET | ORAL | 0 refills | Status: DC
  Filled 2021-03-12: qty 60, 30d supply, fill #0

## 2021-03-12 NOTE — Telephone Encounter (Signed)
Scheduled per 08/19 los, patient has been called and notified of all upcoming appointments.

## 2021-03-16 ENCOUNTER — Inpatient Hospital Stay: Payer: Medicare Other | Attending: Oncology

## 2021-03-16 ENCOUNTER — Inpatient Hospital Stay: Payer: Medicare Other

## 2021-03-16 ENCOUNTER — Other Ambulatory Visit: Payer: Self-pay

## 2021-03-16 VITALS — BP 137/89 | HR 70 | Temp 98.6°F | Resp 18 | Ht 73.0 in | Wt 159.2 lb

## 2021-03-16 DIAGNOSIS — C9001 Multiple myeloma in remission: Secondary | ICD-10-CM

## 2021-03-16 DIAGNOSIS — Z5112 Encounter for antineoplastic immunotherapy: Secondary | ICD-10-CM | POA: Diagnosis not present

## 2021-03-16 DIAGNOSIS — Z8042 Family history of malignant neoplasm of prostate: Secondary | ICD-10-CM | POA: Insufficient documentation

## 2021-03-16 DIAGNOSIS — C61 Malignant neoplasm of prostate: Secondary | ICD-10-CM

## 2021-03-16 DIAGNOSIS — C9002 Multiple myeloma in relapse: Secondary | ICD-10-CM | POA: Insufficient documentation

## 2021-03-16 DIAGNOSIS — Z95828 Presence of other vascular implants and grafts: Secondary | ICD-10-CM

## 2021-03-16 LAB — CMP (CANCER CENTER ONLY)
ALT: 40 U/L (ref 0–44)
AST: 27 U/L (ref 15–41)
Albumin: 3.8 g/dL (ref 3.5–5.0)
Alkaline Phosphatase: 84 U/L (ref 38–126)
Anion gap: 9 (ref 5–15)
BUN: 15 mg/dL (ref 8–23)
CO2: 24 mmol/L (ref 22–32)
Calcium: 8.7 mg/dL — ABNORMAL LOW (ref 8.9–10.3)
Chloride: 105 mmol/L (ref 98–111)
Creatinine: 0.86 mg/dL (ref 0.61–1.24)
GFR, Estimated: >60 mL/min (ref >60)
Glucose, Bld: 101 mg/dL — ABNORMAL HIGH (ref 70–99)
Potassium: 4.5 mmol/L (ref 3.5–5.1)
Sodium: 138 mmol/L (ref 135–145)
Total Bilirubin: 0.5 mg/dL (ref 0.3–1.2)
Total Protein: 6.7 g/dL (ref 6.5–8.1)

## 2021-03-16 LAB — CBC WITH DIFFERENTIAL (CANCER CENTER ONLY)
Abs Immature Granulocytes: 0.02 10*3/uL (ref 0.00–0.07)
Basophils Absolute: 0 10*3/uL (ref 0.0–0.1)
Basophils Relative: 0 %
Eosinophils Absolute: 0 10*3/uL (ref 0.0–0.5)
Eosinophils Relative: 1 %
HCT: 33 % — ABNORMAL LOW (ref 39.0–52.0)
Hemoglobin: 11.3 g/dL — ABNORMAL LOW (ref 13.0–17.0)
Immature Granulocytes: 1 %
Lymphocytes Relative: 21 %
Lymphs Abs: 0.9 10*3/uL (ref 0.7–4.0)
MCH: 33.6 pg (ref 26.0–34.0)
MCHC: 34.2 g/dL (ref 30.0–36.0)
MCV: 98.2 fL (ref 80.0–100.0)
Monocytes Absolute: 0.5 10*3/uL (ref 0.1–1.0)
Monocytes Relative: 10 %
Neutro Abs: 3 10*3/uL (ref 1.7–7.7)
Neutrophils Relative %: 67 %
Platelet Count: 154 10*3/uL (ref 150–400)
RBC: 3.36 MIL/uL — ABNORMAL LOW (ref 4.22–5.81)
RDW: 12.6 % (ref 11.5–15.5)
WBC Count: 4.4 10*3/uL (ref 4.0–10.5)
nRBC: 0 % (ref 0.0–0.2)

## 2021-03-16 MED ORDER — DIPHENHYDRAMINE HCL 25 MG PO CAPS
50.0000 mg | ORAL_CAPSULE | Freq: Once | ORAL | Status: AC
  Administered 2021-03-16: 50 mg via ORAL
  Filled 2021-03-16: qty 2

## 2021-03-16 MED ORDER — HEPARIN SOD (PORK) LOCK FLUSH 100 UNIT/ML IV SOLN
500.0000 [IU] | Freq: Once | INTRAVENOUS | Status: AC | PRN
Start: 2021-03-16 — End: 2021-03-16
  Administered 2021-03-16: 500 [IU]

## 2021-03-16 MED ORDER — SODIUM CHLORIDE 0.9 % IV SOLN: Freq: Once | INTRAVENOUS | Status: AC

## 2021-03-16 MED ORDER — DARATUMUMAB-HYALURONIDASE-FIHJ 1800-30000 MG-UT/15ML ~~LOC~~ SOLN
1800.0000 mg | Freq: Once | SUBCUTANEOUS | Status: AC
  Administered 2021-03-16: 1800 mg via SUBCUTANEOUS
  Filled 2021-03-16: qty 15

## 2021-03-16 MED ORDER — DEXTROSE 5 % IV SOLN
56.0000 mg/m2 | Freq: Once | INTRAVENOUS | Status: AC
  Administered 2021-03-16: 110 mg via INTRAVENOUS
  Filled 2021-03-16: qty 10

## 2021-03-16 MED ORDER — SODIUM CHLORIDE 0.9% FLUSH
10.0000 mL | INTRAVENOUS | Status: DC | PRN
  Administered 2021-03-16: 10 mL

## 2021-03-16 MED ORDER — SODIUM CHLORIDE 0.9 % IV SOLN
40.0000 mg | Freq: Once | INTRAVENOUS | Status: AC
  Administered 2021-03-16: 40 mg via INTRAVENOUS
  Filled 2021-03-16: qty 4

## 2021-03-16 MED ORDER — ACETAMINOPHEN 325 MG PO TABS
650.0000 mg | ORAL_TABLET | Freq: Once | ORAL | Status: AC
  Administered 2021-03-16: 650 mg via ORAL
  Filled 2021-03-16: qty 2

## 2021-03-16 NOTE — Patient Instructions (Signed)
[facility]   Discharge Instructions: Thank you for choosing [facility] to provide your oncology and hematology care.   If you have a lab appointment with the [facility], please go directly to the [facility] and check in at the registration area.   Wear comfortable clothing and clothing appropriate for easy access to any Portacath or PICC line.   We strive to give you quality time with your provider. You may need to reschedule your appointment if you arrive late (15 or more minutes).  Arriving late affects you and other patients whose appointments are after yours.  Also, if you miss three or more appointments without notifying the office, you may be dismissed from the clinic at the provider's discretion.      For prescription refill requests, have your pharmacy contact our office and allow 72 hours for refills to be completed.    Today you received the following chemotherapy and/or immunotherapy agents: Kyprolis and Darzalex Faspro.      To help prevent nausea and vomiting after your treatment, we encourage you to take your nausea medication as directed.  BELOW ARE SYMPTOMS THAT SHOULD BE REPORTED IMMEDIATELY: *FEVER GREATER THAN 100.4 F (38 C) OR HIGHER *CHILLS OR SWEATING *NAUSEA AND VOMITING THAT IS NOT CONTROLLED WITH YOUR NAUSEA MEDICATION *UNUSUAL SHORTNESS OF BREATH *UNUSUAL BRUISING OR BLEEDING *URINARY PROBLEMS (pain or burning when urinating, or frequent urination) *BOWEL PROBLEMS (unusual diarrhea, constipation, pain near the anus) TENDERNESS IN MOUTH AND THROAT WITH OR WITHOUT PRESENCE OF ULCERS (sore throat, sores in mouth, or a toothache) UNUSUAL RASH, SWELLING OR PAIN  UNUSUAL VAGINAL DISCHARGE OR ITCHING   Items with * indicate a potential emergency and should be followed up as soon as possible or go to the Emergency Department if any problems should occur.  Please show the CHEMOTHERAPY ALERT CARD or IMMUNOTHERAPY  ALERT CARD at check-in to the Emergency Department and triage nurse.  Should you have questions after your visit or need to cancel or reschedule your appointment, please contact [facility]  Dept: [phone]  and follow the prompts.  Office hours are 8:00 a.m. to 4:30 p.m. Monday - Friday. Please note that voicemails left after 4:00 p.m. may not be returned until the following business day.  We are closed weekends and major holidays. You have access to a nurse at all times for urgent questions. Please call the main number to the clinic Dept: [phone] and follow the prompts.   For any non-urgent questions, you may also contact your provider using MyChart. We now offer e-Visits for anyone 54 and older to request care online for non-urgent symptoms. For details visit [email or web address].   Also download the MyChart app! Go to the app store, search "MyChart", open the app, select , and log in with your MyChart username and password.  Due to Covid, a mask is required upon entering the hospital/clinic. If you do not have a mask, one will be given to you upon arrival. For doctor visits, patients may have 1 support person aged 98 or older with them. For treatment visits, patients cannot have anyone with them due to current Covid guidelines and our immunocompromised population.

## 2021-03-23 ENCOUNTER — Other Ambulatory Visit: Payer: Self-pay

## 2021-03-23 ENCOUNTER — Inpatient Hospital Stay: Payer: Medicare Other

## 2021-03-23 VITALS — BP 140/86 | HR 77 | Temp 98.7°F | Resp 20 | Wt 161.0 lb

## 2021-03-23 DIAGNOSIS — C9001 Multiple myeloma in remission: Secondary | ICD-10-CM

## 2021-03-23 DIAGNOSIS — C61 Malignant neoplasm of prostate: Secondary | ICD-10-CM

## 2021-03-23 DIAGNOSIS — C9002 Multiple myeloma in relapse: Secondary | ICD-10-CM | POA: Diagnosis not present

## 2021-03-23 DIAGNOSIS — Z5112 Encounter for antineoplastic immunotherapy: Secondary | ICD-10-CM | POA: Diagnosis not present

## 2021-03-23 DIAGNOSIS — Z95828 Presence of other vascular implants and grafts: Secondary | ICD-10-CM

## 2021-03-23 LAB — CBC WITH DIFFERENTIAL (CANCER CENTER ONLY)
Abs Immature Granulocytes: 0.01 10*3/uL (ref 0.00–0.07)
Basophils Absolute: 0 10*3/uL (ref 0.0–0.1)
Basophils Relative: 0 %
Eosinophils Absolute: 0 10*3/uL (ref 0.0–0.5)
Eosinophils Relative: 1 %
HCT: 35.3 % — ABNORMAL LOW (ref 39.0–52.0)
Hemoglobin: 12.1 g/dL — ABNORMAL LOW (ref 13.0–17.0)
Immature Granulocytes: 0 %
Lymphocytes Relative: 29 %
Lymphs Abs: 1.1 10*3/uL (ref 0.7–4.0)
MCH: 33.7 pg (ref 26.0–34.0)
MCHC: 34.3 g/dL (ref 30.0–36.0)
MCV: 98.3 fL (ref 80.0–100.0)
Monocytes Absolute: 0.4 10*3/uL (ref 0.1–1.0)
Monocytes Relative: 12 %
Neutro Abs: 2.2 10*3/uL (ref 1.7–7.7)
Neutrophils Relative %: 58 %
Platelet Count: 157 10*3/uL (ref 150–400)
RBC: 3.59 MIL/uL — ABNORMAL LOW (ref 4.22–5.81)
RDW: 12.2 % (ref 11.5–15.5)
WBC Count: 3.7 10*3/uL — ABNORMAL LOW (ref 4.0–10.5)
nRBC: 0 % (ref 0.0–0.2)

## 2021-03-23 LAB — CMP (CANCER CENTER ONLY)
ALT: 22 U/L (ref 0–44)
AST: 16 U/L (ref 15–41)
Albumin: 3.9 g/dL (ref 3.5–5.0)
Alkaline Phosphatase: 81 U/L (ref 38–126)
Anion gap: 9 (ref 5–15)
BUN: 10 mg/dL (ref 8–23)
CO2: 23 mmol/L (ref 22–32)
Calcium: 8.8 mg/dL — ABNORMAL LOW (ref 8.9–10.3)
Chloride: 105 mmol/L (ref 98–111)
Creatinine: 0.89 mg/dL (ref 0.61–1.24)
GFR, Estimated: >60 mL/min (ref >60)
Glucose, Bld: 106 mg/dL — ABNORMAL HIGH (ref 70–99)
Potassium: 3.9 mmol/L (ref 3.5–5.1)
Sodium: 137 mmol/L (ref 135–145)
Total Bilirubin: 0.5 mg/dL (ref 0.3–1.2)
Total Protein: 6.9 g/dL (ref 6.5–8.1)

## 2021-03-23 MED ORDER — SODIUM CHLORIDE 0.9 % IV SOLN: Freq: Once | INTRAVENOUS | Status: AC

## 2021-03-23 MED ORDER — HEPARIN SOD (PORK) LOCK FLUSH 100 UNIT/ML IV SOLN
500.0000 [IU] | Freq: Once | INTRAVENOUS | Status: AC | PRN
  Administered 2021-03-23: 500 [IU]

## 2021-03-23 MED ORDER — SODIUM CHLORIDE 0.9 % IV SOLN
40.0000 mg | Freq: Once | INTRAVENOUS | Status: AC
  Administered 2021-03-23: 40 mg via INTRAVENOUS
  Filled 2021-03-23: qty 4

## 2021-03-23 MED ORDER — DEXTROSE 5 % IV SOLN
56.0000 mg/m2 | Freq: Once | INTRAVENOUS | Status: AC
  Administered 2021-03-23: 110 mg via INTRAVENOUS
  Filled 2021-03-23: qty 10

## 2021-03-23 MED ORDER — SODIUM CHLORIDE 0.9% FLUSH
10.0000 mL | INTRAVENOUS | Status: DC | PRN
  Administered 2021-03-23: 10 mL

## 2021-03-23 NOTE — Patient Instructions (Signed)
[facility]  Discharge Instructions: Thank you for choosing [facility] to provide your oncology and hematology care.   If you have a lab appointment with the Cancer Center, please go directly to the Cancer Center and check in at the registration area.   Wear comfortable clothing and clothing appropriate for easy access to any Portacath or PICC line.   We strive to give you quality time with your provider. You may need to reschedule your appointment if you arrive late (15 or more minutes).  Arriving late affects you and other patients whose appointments are after yours.  Also, if you miss three or more appointments without notifying the office, you may be dismissed from the clinic at the provider's discretion.      For prescription refill requests, have your pharmacy contact our office and allow 72 hours for refills to be completed.    Today you received the following chemotherapy and/or immunotherapy agent: Carfilzomib (Kyprolis).    To help prevent nausea and vomiting after your treatment, we encourage you to take your nausea medication as directed.  BELOW ARE SYMPTOMS THAT SHOULD BE REPORTED IMMEDIATELY: *FEVER GREATER THAN 100.4 F (38 C) OR HIGHER *CHILLS OR SWEATING *NAUSEA AND VOMITING THAT IS NOT CONTROLLED WITH YOUR NAUSEA MEDICATION *UNUSUAL SHORTNESS OF BREATH *UNUSUAL BRUISING OR BLEEDING *URINARY PROBLEMS (pain or burning when urinating, or frequent urination) *BOWEL PROBLEMS (unusual diarrhea, constipation, pain near the anus) TENDERNESS IN MOUTH AND THROAT WITH OR WITHOUT PRESENCE OF ULCERS (sore throat, sores in mouth, or a toothache) UNUSUAL RASH, SWELLING OR PAIN  UNUSUAL VAGINAL DISCHARGE OR ITCHING   Items with * indicate a potential emergency and should be followed up as soon as possible or go to the Emergency Department if any problems should occur.  Please show the CHEMOTHERAPY ALERT CARD or IMMUNOTHERAPY ALERT CARD at  check-in to the Emergency Department and triage nurse.  Should you have questions after your visit or need to cancel or reschedule your appointment, please contact [facility]  Dept: [phone]  and follow the prompts.  Office hours are 8:00 a.m. to 4:30 p.m. Monday - Friday. Please note that voicemails left after 4:00 p.m. may not be returned until the following business day.  We are closed weekends and major holidays. You have access to a nurse at all times for urgent questions. Please call the main number to the clinic Dept: [phone] and follow the prompts.   For any non-urgent questions, you may also contact your provider using MyChart. We now offer e-Visits for anyone 18 and older to request care online for non-urgent symptoms. For details visit [email or web address].   Also download the MyChart app! Go to the app store, search "MyChart", open the app, select [facility], and log in with your MyChart username and password.  Due to Covid, a mask is required upon entering the hospital/clinic. If you do not have a mask, one will be given to you upon arrival. For doctor visits, patients may have 1 support person aged 18 or older with them. For treatment visits, patients cannot have anyone with them due to current Covid guidelines and our immunocompromised population.   

## 2021-03-30 ENCOUNTER — Other Ambulatory Visit: Payer: Self-pay

## 2021-03-30 ENCOUNTER — Inpatient Hospital Stay: Payer: Medicare Other

## 2021-03-30 VITALS — BP 155/87 | HR 64 | Temp 98.7°F | Resp 18 | Wt 161.2 lb

## 2021-03-30 DIAGNOSIS — Z95828 Presence of other vascular implants and grafts: Secondary | ICD-10-CM

## 2021-03-30 DIAGNOSIS — C9001 Multiple myeloma in remission: Secondary | ICD-10-CM

## 2021-03-30 DIAGNOSIS — C61 Malignant neoplasm of prostate: Secondary | ICD-10-CM

## 2021-03-30 DIAGNOSIS — C9002 Multiple myeloma in relapse: Secondary | ICD-10-CM | POA: Diagnosis not present

## 2021-03-30 DIAGNOSIS — Z5112 Encounter for antineoplastic immunotherapy: Secondary | ICD-10-CM | POA: Diagnosis not present

## 2021-03-30 LAB — CBC WITH DIFFERENTIAL (CANCER CENTER ONLY)
Abs Immature Granulocytes: 0.02 10*3/uL (ref 0.00–0.07)
Basophils Absolute: 0 10*3/uL (ref 0.0–0.1)
Basophils Relative: 0 %
Eosinophils Absolute: 0 10*3/uL (ref 0.0–0.5)
Eosinophils Relative: 1 %
HCT: 34.5 % — ABNORMAL LOW (ref 39.0–52.0)
Hemoglobin: 11.7 g/dL — ABNORMAL LOW (ref 13.0–17.0)
Immature Granulocytes: 0 %
Lymphocytes Relative: 29 %
Lymphs Abs: 1.4 10*3/uL (ref 0.7–4.0)
MCH: 33.9 pg (ref 26.0–34.0)
MCHC: 33.9 g/dL (ref 30.0–36.0)
MCV: 100 fL (ref 80.0–100.0)
Monocytes Absolute: 0.5 10*3/uL (ref 0.1–1.0)
Monocytes Relative: 10 %
Neutro Abs: 2.9 10*3/uL (ref 1.7–7.7)
Neutrophils Relative %: 60 %
Platelet Count: 161 10*3/uL (ref 150–400)
RBC: 3.45 MIL/uL — ABNORMAL LOW (ref 4.22–5.81)
RDW: 12.4 % (ref 11.5–15.5)
WBC Count: 4.9 10*3/uL (ref 4.0–10.5)
nRBC: 0 % (ref 0.0–0.2)

## 2021-03-30 LAB — CMP (CANCER CENTER ONLY)
ALT: 18 U/L (ref 0–44)
AST: 17 U/L (ref 15–41)
Albumin: 4.1 g/dL (ref 3.5–5.0)
Alkaline Phosphatase: 75 U/L (ref 38–126)
Anion gap: 7 (ref 5–15)
BUN: 16 mg/dL (ref 8–23)
CO2: 25 mmol/L (ref 22–32)
Calcium: 9.3 mg/dL (ref 8.9–10.3)
Chloride: 103 mmol/L (ref 98–111)
Creatinine: 0.83 mg/dL (ref 0.61–1.24)
GFR, Estimated: >60 mL/min (ref >60)
Glucose, Bld: 96 mg/dL (ref 70–99)
Potassium: 4.6 mmol/L (ref 3.5–5.1)
Sodium: 135 mmol/L (ref 135–145)
Total Bilirubin: 0.5 mg/dL (ref 0.3–1.2)
Total Protein: 7.2 g/dL (ref 6.5–8.1)

## 2021-03-30 MED ORDER — DARATUMUMAB-HYALURONIDASE-FIHJ 1800-30000 MG-UT/15ML ~~LOC~~ SOLN
1800.0000 mg | Freq: Once | SUBCUTANEOUS | Status: AC
  Administered 2021-03-30: 1800 mg via SUBCUTANEOUS
  Filled 2021-03-30: qty 15

## 2021-03-30 MED ORDER — SODIUM CHLORIDE 0.9% FLUSH
10.0000 mL | INTRAVENOUS | Status: DC | PRN
  Administered 2021-03-30: 10 mL

## 2021-03-30 MED ORDER — HEPARIN SOD (PORK) LOCK FLUSH 100 UNIT/ML IV SOLN
500.0000 [IU] | Freq: Once | INTRAVENOUS | Status: AC | PRN
  Administered 2021-03-30: 500 [IU]

## 2021-03-30 MED ORDER — ACETAMINOPHEN 325 MG PO TABS
650.0000 mg | ORAL_TABLET | Freq: Once | ORAL | Status: AC
  Administered 2021-03-30: 650 mg via ORAL
  Filled 2021-03-30: qty 2

## 2021-03-30 MED ORDER — SODIUM CHLORIDE 0.9 % IV SOLN
40.0000 mg | Freq: Once | INTRAVENOUS | Status: AC
  Administered 2021-03-30: 40 mg via INTRAVENOUS
  Filled 2021-03-30: qty 4

## 2021-03-30 MED ORDER — DIPHENHYDRAMINE HCL 25 MG PO CAPS
50.0000 mg | ORAL_CAPSULE | Freq: Once | ORAL | Status: AC
  Administered 2021-03-30: 50 mg via ORAL
  Filled 2021-03-30: qty 2

## 2021-03-30 MED ORDER — DEXTROSE 5 % IV SOLN
56.0000 mg/m2 | Freq: Once | INTRAVENOUS | Status: AC
  Administered 2021-03-30: 110 mg via INTRAVENOUS
  Filled 2021-03-30: qty 30

## 2021-03-30 MED ORDER — SODIUM CHLORIDE 0.9 % IV SOLN: Freq: Once | INTRAVENOUS | Status: AC

## 2021-03-30 NOTE — Patient Instructions (Signed)
[facility]COL[facility]  Discharge Instructions: Thank you for choosing [facility] to provide your oncology and hematology care.   If you have a lab appointment with the [facility], please go directly to the [facility] and check in at the registration area.   Wear comfortable clothing and clothing appropriate for easy access to any Portacath or PICC line.   We strive to give you quality time with your provider. You may need to reschedule your appointment if you arrive late (15 or more minutes).  Arriving late affects you and other patients whose appointments are after yours.  Also, if you miss three or more appointments without notifying the office, you may be dismissed from the clinic at the provider's discretion.      For prescription refill requests, have your pharmacy contact our office and allow 72 hours for refills to be completed.    Today you received the following chemotherapy and/or immunotherapy agents Kyprolis and Darzalex      To help prevent nausea and vomiting after your treatment, we encourage you to take your nausea medication as directed.  BELOW ARE SYMPTOMS THAT SHOULD BE REPORTED IMMEDIATELY: *FEVER GREATER THAN 100.4 F (38 C) OR HIGHER *CHILLS OR SWEATING *NAUSEA AND VOMITING THAT IS NOT CONTROLLED WITH YOUR NAUSEA MEDICATION *UNUSUAL SHORTNESS OF BREATH *UNUSUAL BRUISING OR BLEEDING *URINARY PROBLEMS (pain or burning when urinating, or frequent urination) *BOWEL PROBLEMS (unusual diarrhea, constipation, pain near the anus) TENDERNESS IN MOUTH AND THROAT WITH OR WITHOUT PRESENCE OF ULCERS (sore throat, sores in mouth, or a toothache) UNUSUAL RASH, SWELLING OR PAIN  UNUSUAL VAGINAL DISCHARGE OR ITCHING   Items with * indicate a potential emergency and should be followed up as soon as possible or go to the Emergency Department if any problems should occur.  Please show the CHEMOTHERAPY ALERT CARD or IMMUNOTHERAPY ALERT CARD at  check-in to the Emergency Department and triage nurse.  Should you have questions after your visit or need to cancel or reschedule your appointment, please contact [facility]  Dept: [phone]  and follow the prompts.  Office hours are 8:00 a.m. to 4:30 p.m. Monday - Friday. Please note that voicemails left after 4:00 p.m. may not be returned until the following business day.  We are closed weekends and major holidays. You have access to a nurse at all times for urgent questions. Please call the main number to the clinic Dept: [phone] and follow the prompts.   For any non-urgent questions, you may also contact your provider using MyChart. We now offer e-Visits for anyone 38 and older to request care online for non-urgent symptoms. For details visit [email or web address].   Also download the MyChart app! Go to the app store, search "MyChart", open the app, select [facility], and log in with your MyChart username and password.  Due to Covid, a mask is required upon entering the hospital/clinic. If you do not have a mask, one will be given to you upon arrival. For doctor visits, patients may have 1 support person aged 85 or older with them. For treatment visits, patients cannot have anyone with them due to current Covid guidelines and our immunocompromised population.

## 2021-04-06 ENCOUNTER — Other Ambulatory Visit: Payer: Self-pay

## 2021-04-06 ENCOUNTER — Inpatient Hospital Stay: Payer: Medicare Other

## 2021-04-06 VITALS — BP 168/85 | HR 70 | Temp 98.7°F | Resp 16 | Wt 160.0 lb

## 2021-04-06 DIAGNOSIS — C9002 Multiple myeloma in relapse: Secondary | ICD-10-CM | POA: Diagnosis not present

## 2021-04-06 DIAGNOSIS — C61 Malignant neoplasm of prostate: Secondary | ICD-10-CM

## 2021-04-06 DIAGNOSIS — C9001 Multiple myeloma in remission: Secondary | ICD-10-CM

## 2021-04-06 DIAGNOSIS — Z5112 Encounter for antineoplastic immunotherapy: Secondary | ICD-10-CM | POA: Diagnosis not present

## 2021-04-06 DIAGNOSIS — Z95828 Presence of other vascular implants and grafts: Secondary | ICD-10-CM

## 2021-04-06 LAB — CBC WITH DIFFERENTIAL (CANCER CENTER ONLY)
Abs Immature Granulocytes: 0.02 10*3/uL (ref 0.00–0.07)
Basophils Absolute: 0 10*3/uL (ref 0.0–0.1)
Basophils Relative: 0 %
Eosinophils Absolute: 0 10*3/uL (ref 0.0–0.5)
Eosinophils Relative: 1 %
HCT: 32.7 % — ABNORMAL LOW (ref 39.0–52.0)
Hemoglobin: 11.3 g/dL — ABNORMAL LOW (ref 13.0–17.0)
Immature Granulocytes: 0 %
Lymphocytes Relative: 29 %
Lymphs Abs: 1.7 10*3/uL (ref 0.7–4.0)
MCH: 34.5 pg — ABNORMAL HIGH (ref 26.0–34.0)
MCHC: 34.6 g/dL (ref 30.0–36.0)
MCV: 99.7 fL (ref 80.0–100.0)
Monocytes Absolute: 0.5 10*3/uL (ref 0.1–1.0)
Monocytes Relative: 9 %
Neutro Abs: 3.6 10*3/uL (ref 1.7–7.7)
Neutrophils Relative %: 61 %
Platelet Count: 153 10*3/uL (ref 150–400)
RBC: 3.28 MIL/uL — ABNORMAL LOW (ref 4.22–5.81)
RDW: 12.7 % (ref 11.5–15.5)
WBC Count: 5.9 10*3/uL (ref 4.0–10.5)
nRBC: 0 % (ref 0.0–0.2)

## 2021-04-06 LAB — CMP (CANCER CENTER ONLY)
ALT: 17 U/L (ref 0–44)
AST: 15 U/L (ref 15–41)
Albumin: 4 g/dL (ref 3.5–5.0)
Alkaline Phosphatase: 73 U/L (ref 38–126)
Anion gap: 9 (ref 5–15)
BUN: 17 mg/dL (ref 8–23)
CO2: 21 mmol/L — ABNORMAL LOW (ref 22–32)
Calcium: 9.4 mg/dL (ref 8.9–10.3)
Chloride: 107 mmol/L (ref 98–111)
Creatinine: 0.83 mg/dL (ref 0.61–1.24)
GFR, Estimated: >60 mL/min (ref >60)
Glucose, Bld: 102 mg/dL — ABNORMAL HIGH (ref 70–99)
Potassium: 4.1 mmol/L (ref 3.5–5.1)
Sodium: 137 mmol/L (ref 135–145)
Total Bilirubin: 0.6 mg/dL (ref 0.3–1.2)
Total Protein: 7.1 g/dL (ref 6.5–8.1)

## 2021-04-06 MED ORDER — HEPARIN SOD (PORK) LOCK FLUSH 100 UNIT/ML IV SOLN
500.0000 [IU] | Freq: Once | INTRAVENOUS | Status: AC | PRN
  Administered 2021-04-06: 500 [IU]

## 2021-04-06 MED ORDER — SODIUM CHLORIDE 0.9% FLUSH
10.0000 mL | INTRAVENOUS | Status: DC | PRN
  Administered 2021-04-06: 10 mL

## 2021-04-06 MED ORDER — DEXTROSE 5 % IV SOLN
56.0000 mg/m2 | Freq: Once | INTRAVENOUS | Status: AC
  Administered 2021-04-06: 110 mg via INTRAVENOUS
  Filled 2021-04-06: qty 30

## 2021-04-06 MED ORDER — SODIUM CHLORIDE 0.9 % IV SOLN: Freq: Once | INTRAVENOUS | Status: AC

## 2021-04-06 MED ORDER — SODIUM CHLORIDE 0.9 % IV SOLN
40.0000 mg | Freq: Once | INTRAVENOUS | Status: AC
  Administered 2021-04-06: 40 mg via INTRAVENOUS
  Filled 2021-04-06: qty 4

## 2021-04-06 NOTE — Patient Instructions (Signed)
[facility]  Discharge Instructions: Thank you for choosing [facility] to provide your oncology and hematology care.   If you have a lab appointment with the Cancer Center, please go directly to the Cancer Center and check in at the registration area.   Wear comfortable clothing and clothing appropriate for easy access to any Portacath or PICC line.   We strive to give you quality time with your provider. You may need to reschedule your appointment if you arrive late (15 or more minutes).  Arriving late affects you and other patients whose appointments are after yours.  Also, if you miss three or more appointments without notifying the office, you may be dismissed from the clinic at the provider's discretion.      For prescription refill requests, have your pharmacy contact our office and allow 72 hours for refills to be completed.    Today you received the following chemotherapy and/or immunotherapy agent: Carfilzomib (Kyprolis).    To help prevent nausea and vomiting after your treatment, we encourage you to take your nausea medication as directed.  BELOW ARE SYMPTOMS THAT SHOULD BE REPORTED IMMEDIATELY: *FEVER GREATER THAN 100.4 F (38 C) OR HIGHER *CHILLS OR SWEATING *NAUSEA AND VOMITING THAT IS NOT CONTROLLED WITH YOUR NAUSEA MEDICATION *UNUSUAL SHORTNESS OF BREATH *UNUSUAL BRUISING OR BLEEDING *URINARY PROBLEMS (pain or burning when urinating, or frequent urination) *BOWEL PROBLEMS (unusual diarrhea, constipation, pain near the anus) TENDERNESS IN MOUTH AND THROAT WITH OR WITHOUT PRESENCE OF ULCERS (sore throat, sores in mouth, or a toothache) UNUSUAL RASH, SWELLING OR PAIN  UNUSUAL VAGINAL DISCHARGE OR ITCHING   Items with * indicate a potential emergency and should be followed up as soon as possible or go to the Emergency Department if any problems should occur.  Please show the CHEMOTHERAPY ALERT CARD or IMMUNOTHERAPY ALERT CARD at  check-in to the Emergency Department and triage nurse.  Should you have questions after your visit or need to cancel or reschedule your appointment, please contact [facility]  Dept: [phone]  and follow the prompts.  Office hours are 8:00 a.m. to 4:30 p.m. Monday - Friday. Please note that voicemails left after 4:00 p.m. may not be returned until the following business day.  We are closed weekends and major holidays. You have access to a nurse at all times for urgent questions. Please call the main number to the clinic Dept: [phone] and follow the prompts.   For any non-urgent questions, you may also contact your provider using MyChart. We now offer e-Visits for anyone 18 and older to request care online for non-urgent symptoms. For details visit [email or web address].   Also download the MyChart app! Go to the app store, search "MyChart", open the app, select [facility], and log in with your MyChart username and password.  Due to Covid, a mask is required upon entering the hospital/clinic. If you do not have a mask, one will be given to you upon arrival. For doctor visits, patients may have 1 support person aged 18 or older with them. For treatment visits, patients cannot have anyone with them due to current Covid guidelines and our immunocompromised population.   

## 2021-04-09 ENCOUNTER — Other Ambulatory Visit (HOSPITAL_COMMUNITY): Payer: Self-pay

## 2021-04-09 LAB — KAPPA/LAMBDA LIGHT CHAINS
Kappa free light chain: 14.5 mg/L (ref 3.3–19.4)
Kappa, lambda light chain ratio: 4.83 — ABNORMAL HIGH (ref 0.26–1.65)
Lambda free light chains: 3 mg/L — ABNORMAL LOW (ref 5.7–26.3)

## 2021-04-09 MED ORDER — OXYCODONE-ACETAMINOPHEN 10-325 MG PO TABS
ORAL_TABLET | ORAL | 0 refills | Status: DC
  Filled 2021-04-13: qty 60, 30d supply, fill #0

## 2021-04-10 ENCOUNTER — Other Ambulatory Visit (HOSPITAL_COMMUNITY): Payer: Self-pay

## 2021-04-13 ENCOUNTER — Other Ambulatory Visit (HOSPITAL_COMMUNITY): Payer: Self-pay

## 2021-04-13 ENCOUNTER — Other Ambulatory Visit: Payer: Self-pay

## 2021-04-13 ENCOUNTER — Inpatient Hospital Stay: Payer: Medicare Other

## 2021-04-13 VITALS — BP 149/93 | HR 74 | Temp 99.1°F | Resp 17 | Wt 163.1 lb

## 2021-04-13 DIAGNOSIS — C9001 Multiple myeloma in remission: Secondary | ICD-10-CM

## 2021-04-13 DIAGNOSIS — Z5112 Encounter for antineoplastic immunotherapy: Secondary | ICD-10-CM | POA: Diagnosis not present

## 2021-04-13 DIAGNOSIS — Z95828 Presence of other vascular implants and grafts: Secondary | ICD-10-CM

## 2021-04-13 DIAGNOSIS — C9002 Multiple myeloma in relapse: Secondary | ICD-10-CM | POA: Diagnosis not present

## 2021-04-13 DIAGNOSIS — C61 Malignant neoplasm of prostate: Secondary | ICD-10-CM

## 2021-04-13 LAB — CMP (CANCER CENTER ONLY)
ALT: 17 U/L (ref 0–44)
AST: 16 U/L (ref 15–41)
Albumin: 3.9 g/dL (ref 3.5–5.0)
Alkaline Phosphatase: 82 U/L (ref 38–126)
Anion gap: 8 (ref 5–15)
BUN: 11 mg/dL (ref 8–23)
CO2: 23 mmol/L (ref 22–32)
Calcium: 9.1 mg/dL (ref 8.9–10.3)
Chloride: 108 mmol/L (ref 98–111)
Creatinine: 0.81 mg/dL (ref 0.61–1.24)
GFR, Estimated: >60 mL/min (ref >60)
Glucose, Bld: 96 mg/dL (ref 70–99)
Potassium: 4 mmol/L (ref 3.5–5.1)
Sodium: 139 mmol/L (ref 135–145)
Total Bilirubin: 0.6 mg/dL (ref 0.3–1.2)
Total Protein: 6.7 g/dL (ref 6.5–8.1)

## 2021-04-13 LAB — CBC WITH DIFFERENTIAL (CANCER CENTER ONLY)
Abs Immature Granulocytes: 0.01 10*3/uL (ref 0.00–0.07)
Basophils Absolute: 0 10*3/uL (ref 0.0–0.1)
Basophils Relative: 0 %
Eosinophils Absolute: 0 10*3/uL (ref 0.0–0.5)
Eosinophils Relative: 1 %
HCT: 30.4 % — ABNORMAL LOW (ref 39.0–52.0)
Hemoglobin: 10.6 g/dL — ABNORMAL LOW (ref 13.0–17.0)
Immature Granulocytes: 0 %
Lymphocytes Relative: 32 %
Lymphs Abs: 1.4 10*3/uL (ref 0.7–4.0)
MCH: 34.6 pg — ABNORMAL HIGH (ref 26.0–34.0)
MCHC: 34.9 g/dL (ref 30.0–36.0)
MCV: 99.3 fL (ref 80.0–100.0)
Monocytes Absolute: 0.5 10*3/uL (ref 0.1–1.0)
Monocytes Relative: 11 %
Neutro Abs: 2.3 10*3/uL (ref 1.7–7.7)
Neutrophils Relative %: 56 %
Platelet Count: 153 10*3/uL (ref 150–400)
RBC: 3.06 MIL/uL — ABNORMAL LOW (ref 4.22–5.81)
RDW: 13.1 % (ref 11.5–15.5)
WBC Count: 4.2 10*3/uL (ref 4.0–10.5)
nRBC: 0 % (ref 0.0–0.2)

## 2021-04-13 MED ORDER — DARATUMUMAB-HYALURONIDASE-FIHJ 1800-30000 MG-UT/15ML ~~LOC~~ SOLN
1800.0000 mg | Freq: Once | SUBCUTANEOUS | Status: AC
  Administered 2021-04-13: 1800 mg via SUBCUTANEOUS
  Filled 2021-04-13: qty 15

## 2021-04-13 MED ORDER — SODIUM CHLORIDE 0.9 % IV SOLN: Freq: Once | INTRAVENOUS | Status: AC

## 2021-04-13 MED ORDER — SODIUM CHLORIDE 0.9% FLUSH
10.0000 mL | INTRAVENOUS | Status: DC | PRN
  Administered 2021-04-13: 10 mL

## 2021-04-13 MED ORDER — ACETAMINOPHEN 325 MG PO TABS
ORAL_TABLET | ORAL | Status: AC
  Administered 2021-04-13: 650 mg via ORAL
  Filled 2021-04-13: qty 2

## 2021-04-13 MED ORDER — DIPHENHYDRAMINE HCL 25 MG PO CAPS
50.0000 mg | ORAL_CAPSULE | Freq: Once | ORAL | Status: AC
  Filled 2021-04-13: qty 2

## 2021-04-13 MED ORDER — ACETAMINOPHEN 325 MG PO TABS: 650.0000 mg | ORAL_TABLET | Freq: Once | ORAL | Status: AC

## 2021-04-13 MED ORDER — HEPARIN SOD (PORK) LOCK FLUSH 100 UNIT/ML IV SOLN
500.0000 [IU] | Freq: Once | INTRAVENOUS | Status: AC | PRN
  Administered 2021-04-13: 500 [IU]

## 2021-04-13 MED ORDER — SODIUM CHLORIDE 0.9 % IV SOLN
40.0000 mg | Freq: Once | INTRAVENOUS | Status: AC
  Administered 2021-04-13: 40 mg via INTRAVENOUS
  Filled 2021-04-13: qty 4

## 2021-04-13 MED ORDER — DIPHENHYDRAMINE HCL 25 MG PO CAPS
ORAL_CAPSULE | ORAL | Status: AC
  Administered 2021-04-13: 50 mg via ORAL
  Filled 2021-04-13: qty 2

## 2021-04-13 MED ORDER — DEXTROSE 5 % IV SOLN
56.0000 mg/m2 | Freq: Once | INTRAVENOUS | Status: AC
  Administered 2021-04-13: 110 mg via INTRAVENOUS
  Filled 2021-04-13: qty 10

## 2021-04-13 NOTE — Patient Instructions (Signed)
[facility] ONCOL[facility]  Discharge Instructions: Thank you for choosing [facility] to provide your oncology and hematology care.   If you have a lab appointment with the [facility], please go directly to the [facility] and check in at the registration area.   Wear comfortable clothing and clothing appropriate for easy access to any Portacath or PICC line.   We strive to give you quality time with your provider. You may need to reschedule your appointment if you arrive late (15 or more minutes).  Arriving late affects you and other patients whose appointments are after yours.  Also, if you miss three or more appointments without notifying the office, you may be dismissed from the clinic at the provider's discretion.      For prescription refill requests, have your pharmacy contact our office and allow 72 hours for refills to be completed.    Today you received the following chemotherapy and/or immunotherapy agents Carfilzomib and Daratumumab-hyaluronidase-fihj SubQ Injection      To help prevent nausea and vomiting after your treatment, we encourage you to take your nausea medication as directed.  BELOW ARE SYMPTOMS THAT SHOULD BE REPORTED IMMEDIATELY: *FEVER GREATER THAN 100.4 F (38 C) OR HIGHER *CHILLS OR SWEATING *NAUSEA AND VOMITING THAT IS NOT CONTROLLED WITH YOUR NAUSEA MEDICATION *UNUSUAL SHORTNESS OF BREATH *UNUSUAL BRUISING OR BLEEDING *URINARY PROBLEMS (pain or burning when urinating, or frequent urination) *BOWEL PROBLEMS (unusual diarrhea, constipation, pain near the anus) TENDERNESS IN MOUTH AND THROAT WITH OR WITHOUT PRESENCE OF ULCERS (sore throat, sores in mouth, or a toothache) UNUSUAL RASH, SWELLING OR PAIN  UNUSUAL VAGINAL DISCHARGE OR ITCHING   Items with * indicate a potential emergency and should be followed up as soon as possible or go to the Emergency Department if any problems should occur.  Please show the CHEMOTHERAPY  ALERT CARD or IMMUNOTHERAPY ALERT CARD at check-in to the Emergency Department and triage nurse.  Should you have questions after your visit or need to cancel or reschedule your appointment, please contact [facility]  Dept: [phone]  and follow the prompts.  Office hours are 8:00 a.m. to 4:30 p.m. Monday - Friday. Please note that voicemails left after 4:00 p.m. may not be returned until the following business day.  We are closed weekends and major holidays. You have access to a nurse at all times for urgent questions. Please call the main number to the clinic Dept: [phone] and follow the prompts.   For any non-urgent questions, you may also contact your provider using MyChart. We now offer e-Visits for anyone 1 and older to request care online for non-urgent symptoms. For details visit [email or web address].   Also download the MyChart app! Go to the app store, search "MyChart", open the app, select [facility], and log in with your MyChart username and password.  Due to Covid, a mask is required upon entering the hospital/clinic. If you do not have a mask, one will be given to you upon arrival. For doctor visits, patients may have 1 support person aged 70 or older with them. For treatment visits, patients cannot have anyone with them due to current Covid guidelines and our immunocompromised population.

## 2021-04-17 ENCOUNTER — Other Ambulatory Visit (HOSPITAL_COMMUNITY): Payer: Self-pay

## 2021-04-19 LAB — MULTIPLE MYELOMA PANEL, SERUM
Albumin SerPl Elph-Mcnc: 3.9 g/dL (ref 2.9–4.4)
Albumin/Glob SerPl: 1.7 (ref 0.7–1.7)
Alpha 1: 0.1 g/dL (ref 0.0–0.4)
Alpha2 Glob SerPl Elph-Mcnc: 0.6 g/dL (ref 0.4–1.0)
B-Globulin SerPl Elph-Mcnc: 0.7 g/dL (ref 0.7–1.3)
Gamma Glob SerPl Elph-Mcnc: 0.8 g/dL (ref 0.4–1.8)
Globulin, Total: 2.3 g/dL (ref 2.2–3.9)
IgA: 17 mg/dL — ABNORMAL LOW (ref 61–437)
IgG (Immunoglobin G), Serum: 933 mg/dL (ref 603–1613)
IgM (Immunoglobulin M), Srm: 10 mg/dL — ABNORMAL LOW (ref 20–172)
M Protein SerPl Elph-Mcnc: 0.6 g/dL — ABNORMAL HIGH
Total Protein ELP: 6.2 g/dL (ref 6.0–8.5)

## 2021-04-20 ENCOUNTER — Other Ambulatory Visit: Payer: Self-pay

## 2021-04-20 ENCOUNTER — Inpatient Hospital Stay: Payer: Medicare Other

## 2021-04-20 ENCOUNTER — Inpatient Hospital Stay: Payer: Medicare Other | Attending: Oncology

## 2021-04-20 ENCOUNTER — Inpatient Hospital Stay (HOSPITAL_BASED_OUTPATIENT_CLINIC_OR_DEPARTMENT_OTHER): Payer: Medicare Other | Admitting: Oncology

## 2021-04-20 VITALS — BP 155/85 | HR 73 | Temp 97.0°F | Resp 18 | Wt 160.8 lb

## 2021-04-20 DIAGNOSIS — Z79899 Other long term (current) drug therapy: Secondary | ICD-10-CM | POA: Insufficient documentation

## 2021-04-20 DIAGNOSIS — Z95828 Presence of other vascular implants and grafts: Secondary | ICD-10-CM

## 2021-04-20 DIAGNOSIS — C9002 Multiple myeloma in relapse: Secondary | ICD-10-CM | POA: Diagnosis not present

## 2021-04-20 DIAGNOSIS — C61 Malignant neoplasm of prostate: Secondary | ICD-10-CM | POA: Diagnosis not present

## 2021-04-20 DIAGNOSIS — C9001 Multiple myeloma in remission: Secondary | ICD-10-CM

## 2021-04-20 DIAGNOSIS — Z5112 Encounter for antineoplastic immunotherapy: Secondary | ICD-10-CM | POA: Insufficient documentation

## 2021-04-20 DIAGNOSIS — D649 Anemia, unspecified: Secondary | ICD-10-CM | POA: Insufficient documentation

## 2021-04-20 LAB — CBC WITH DIFFERENTIAL (CANCER CENTER ONLY)
Abs Immature Granulocytes: 0.02 10*3/uL (ref 0.00–0.07)
Basophils Absolute: 0 10*3/uL (ref 0.0–0.1)
Basophils Relative: 0 %
Eosinophils Absolute: 0.1 10*3/uL (ref 0.0–0.5)
Eosinophils Relative: 1 %
HCT: 33.3 % — ABNORMAL LOW (ref 39.0–52.0)
Hemoglobin: 11.3 g/dL — ABNORMAL LOW (ref 13.0–17.0)
Immature Granulocytes: 0 %
Lymphocytes Relative: 29 %
Lymphs Abs: 1.7 10*3/uL (ref 0.7–4.0)
MCH: 34.5 pg — ABNORMAL HIGH (ref 26.0–34.0)
MCHC: 33.9 g/dL (ref 30.0–36.0)
MCV: 101.5 fL — ABNORMAL HIGH (ref 80.0–100.0)
Monocytes Absolute: 0.7 10*3/uL (ref 0.1–1.0)
Monocytes Relative: 12 %
Neutro Abs: 3.2 10*3/uL (ref 1.7–7.7)
Neutrophils Relative %: 58 %
Platelet Count: 157 10*3/uL (ref 150–400)
RBC: 3.28 MIL/uL — ABNORMAL LOW (ref 4.22–5.81)
RDW: 13 % (ref 11.5–15.5)
WBC Count: 5.6 10*3/uL (ref 4.0–10.5)
nRBC: 0 % (ref 0.0–0.2)

## 2021-04-20 LAB — CMP (CANCER CENTER ONLY)
ALT: 20 U/L (ref 0–44)
AST: 16 U/L (ref 15–41)
Albumin: 4 g/dL (ref 3.5–5.0)
Alkaline Phosphatase: 74 U/L (ref 38–126)
Anion gap: 7 (ref 5–15)
BUN: 16 mg/dL (ref 8–23)
CO2: 25 mmol/L (ref 22–32)
Calcium: 9.1 mg/dL (ref 8.9–10.3)
Chloride: 105 mmol/L (ref 98–111)
Creatinine: 0.84 mg/dL (ref 0.61–1.24)
GFR, Estimated: >60 mL/min (ref >60)
Glucose, Bld: 88 mg/dL (ref 70–99)
Potassium: 4.7 mmol/L (ref 3.5–5.1)
Sodium: 137 mmol/L (ref 135–145)
Total Bilirubin: 0.4 mg/dL (ref 0.3–1.2)
Total Protein: 6.9 g/dL (ref 6.5–8.1)

## 2021-04-20 MED ORDER — SODIUM CHLORIDE 0.9 % IV SOLN: Freq: Once | INTRAVENOUS | Status: AC

## 2021-04-20 MED ORDER — SODIUM CHLORIDE 0.9% FLUSH
10.0000 mL | INTRAVENOUS | Status: DC | PRN
  Administered 2021-04-20: 10 mL

## 2021-04-20 MED ORDER — SODIUM CHLORIDE 0.9 % IV SOLN
40.0000 mg | Freq: Once | INTRAVENOUS | Status: AC
  Administered 2021-04-20: 40 mg via INTRAVENOUS
  Filled 2021-04-20: qty 4

## 2021-04-20 MED ORDER — DEXTROSE 5 % IV SOLN
56.0000 mg/m2 | Freq: Once | INTRAVENOUS | Status: AC
  Administered 2021-04-20: 110 mg via INTRAVENOUS
  Filled 2021-04-20: qty 30

## 2021-04-20 MED ORDER — HEPARIN SOD (PORK) LOCK FLUSH 100 UNIT/ML IV SOLN
500.0000 [IU] | Freq: Once | INTRAVENOUS | Status: AC | PRN
  Administered 2021-04-20: 500 [IU]

## 2021-04-20 NOTE — Patient Instructions (Signed)
[facility]  Discharge Instructions: Thank you for choosing [facility] to provide your oncology and hematology care.   If you have a lab appointment with the Cancer Center, please go directly to the Cancer Center and check in at the registration area.   Wear comfortable clothing and clothing appropriate for easy access to any Portacath or PICC line.   We strive to give you quality time with your provider. You may need to reschedule your appointment if you arrive late (15 or more minutes).  Arriving late affects you and other patients whose appointments are after yours.  Also, if you miss three or more appointments without notifying the office, you may be dismissed from the clinic at the provider's discretion.      For prescription refill requests, have your pharmacy contact our office and allow 72 hours for refills to be completed.    Today you received the following chemotherapy and/or immunotherapy agent: Carfilzomib (Kyprolis).    To help prevent nausea and vomiting after your treatment, we encourage you to take your nausea medication as directed.  BELOW ARE SYMPTOMS THAT SHOULD BE REPORTED IMMEDIATELY: *FEVER GREATER THAN 100.4 F (38 C) OR HIGHER *CHILLS OR SWEATING *NAUSEA AND VOMITING THAT IS NOT CONTROLLED WITH YOUR NAUSEA MEDICATION *UNUSUAL SHORTNESS OF BREATH *UNUSUAL BRUISING OR BLEEDING *URINARY PROBLEMS (pain or burning when urinating, or frequent urination) *BOWEL PROBLEMS (unusual diarrhea, constipation, pain near the anus) TENDERNESS IN MOUTH AND THROAT WITH OR WITHOUT PRESENCE OF ULCERS (sore throat, sores in mouth, or a toothache) UNUSUAL RASH, SWELLING OR PAIN  UNUSUAL VAGINAL DISCHARGE OR ITCHING   Items with * indicate a potential emergency and should be followed up as soon as possible or go to the Emergency Department if any problems should occur.  Please show the CHEMOTHERAPY ALERT CARD or IMMUNOTHERAPY ALERT CARD at  check-in to the Emergency Department and triage nurse.  Should you have questions after your visit or need to cancel or reschedule your appointment, please contact [facility]  Dept: [phone]  and follow the prompts.  Office hours are 8:00 a.m. to 4:30 p.m. Monday - Friday. Please note that voicemails left after 4:00 p.m. may not be returned until the following business day.  We are closed weekends and major holidays. You have access to a nurse at all times for urgent questions. Please call the main number to the clinic Dept: [phone] and follow the prompts.   For any non-urgent questions, you may also contact your provider using MyChart. We now offer e-Visits for anyone 18 and older to request care online for non-urgent symptoms. For details visit [email or web address].   Also download the MyChart app! Go to the app store, search "MyChart", open the app, select [facility], and log in with your MyChart username and password.  Due to Covid, a mask is required upon entering the hospital/clinic. If you do not have a mask, one will be given to you upon arrival. For doctor visits, patients may have 1 support person aged 18 or older with them. For treatment visits, patients cannot have anyone with them due to current Covid guidelines and our immunocompromised population.   

## 2021-04-20 NOTE — Progress Notes (Signed)
Hematology and Oncology Follow Up Visit  Travis Nguyen [id number] 10-27-1950 70 y.o. 04/20/2021 8:44 AM Travis Nguyen Travis Nguyen, Travis Brow, MD   Principle Diagnosis: 70 year old man with multiple myeloma diagnosed in 2014.  He developed relapsed disease with IgG kappa in 2022.  Secondary diagnosis: Stage a T1c, Gleason score 3+4 = 7 prostate cancer that is currently in remission.  Prior Therapy: He was treated initially with Cytoxan, Velcade and Decadron and subsequently his regimen changed to a Velcade, Revlimid with dexamethasone.  He achieved remission at that time.   He is status post a robotic-assisted laparoscopic radical prostatectomy and bilateral lymph node dissection on 02/09/2014. The final pathology showed prostate adenocarcinoma Gleason score 4+3 equals 7 involving both lobes.    Current therapy: Carfilzomib, and dexamethasone, daratumumab started on July 28, 2020.  Dexamethasone and carfilzomib is weekly with daratumumab every 2 weeks.   Interim History:  Mr. Travis Nguyen is here for return evaluation.  Since the last visit, he reports no major changes in his health.  He continues to tolerate current therapy without any major issues.  He denies any nausea, vomiting or abdominal pain.  He denies any recent hospitalizations or illnesses.  He denies any infusion related issues or neuropathy.  His performance status and quality of life remained excellent.  Continues to exercise regularly including walking 30 minutes daily.     Medications: Reviewed without changes. Current Outpatient Medications  Medication Sig Dispense Refill   acyclovir (ZOVIRAX) 400 MG tablet TAKE 1 TABLET BY MOUTH DAILY. (Patient not taking: No sig reported) 90 tablet 3   amLODipine (NORVASC) 5 MG tablet Take 1 tablet by mouth once a day 90 tablet 1   calcium carbonate (TUMS - DOSED IN MG ELEMENTAL CALCIUM) 500 MG chewable tablet Chew 1 tablet by mouth daily as needed for indigestion or heartburn.      escitalopram (LEXAPRO) 10 MG tablet Take 1 tablet by mouth once a day 90 tablet 1   lidocaine-prilocaine (EMLA) cream APPLY TOPICALLY TO PORT-A-CATH DAILY AS NEEDED 30 g 2   losartan (COZAAR) 100 MG tablet TAKE 1 TABLET BY MOUTH ONCE A DAY (Patient taking differently: Take 100 mg by mouth daily.) 90 tablet 0   losartan (COZAAR) 100 MG tablet Take 1 tablet by mouth once a day 90 tablet 1   oxyCODONE-acetaminophen (PERCOCET) 10-325 MG tablet Take 1 tablet by mouth 2 times a day (Patient taking differently: Take 1 tablet by mouth 2 (two) times daily as needed for pain.) 60 tablet 0   oxyCODONE-acetaminophen (PERCOCET) 10-325 MG tablet Take 1 tablet by mouth twice daily as needed (30 days) (04/11/21) 60 tablet 0   pantoprazole (PROTONIX) 20 MG tablet Take 1 tablet (20 mg total) by mouth daily. 14 tablet 0   vitamin C (ASCORBIC ACID) 500 MG tablet Take 500 mg by mouth daily.     No current facility-administered medications for this visit.   Facility-Administered Medications Ordered in Other Visits  Medication Dose Route Frequency Provider Last Rate Last Admin   sodium chloride flush (NS) 0.9 % injection 10 mL  10 mL Intracatheter PRN Wyatt Portela, MD   10 mL at 04/20/21 4098     Allergies:  Allergies  Allergen Reactions   Aspirin     Pt stated "raises BP"        Physical Exam:     Blood pressure (!) 155/85, pulse 73, temperature (!) 97 F (36.1 C), resp. rate 18, weight 160 lb 12.8 oz (72.9 kg), SpO2 100 %.  ECOG: 1    General appearance: Comfortable appearing without any discomfort Head: Normocephalic without any trauma Oropharynx: Mucous membranes are moist and pink without any thrush or ulcers. Eyes: Pupils are equal and round reactive to light. Lymph nodes: No cervical, supraclavicular, inguinal or axillary lymphadenopathy.   Heart:regular rate and rhythm.  S1 and S2 without leg edema. Lung: Clear without any rhonchi or wheezes.  No dullness to  percussion. Abdomin: Soft, nontender, nondistended with good bowel sounds.  No hepatosplenomegaly. Musculoskeletal: No joint deformity or effusion.  Full range of motion noted. Neurological: No deficits noted on motor, sensory and deep tendon reflex exam. Skin: No petechial rash or dryness.  Appeared moist.               Lab Results: Lab Results  Component Value Date   WBC 4.2 04/13/2021   HGB 10.6 (L) 04/13/2021   HCT 30.4 (L) 04/13/2021   MCV 99.3 04/13/2021   PLT 153 04/13/2021     Chemistry      Component Value Date/Time   NA 139 04/13/2021 0844   NA 138 03/27/2017 0838   K 4.0 04/13/2021 0844   K 4.3 03/27/2017 0838   CL 108 04/13/2021 0844   CL 105 01/01/2013 0835   CO2 23 04/13/2021 0844   CO2 24 03/27/2017 0838   BUN 11 04/13/2021 0844   BUN 23.7 03/27/2017 0838   CREATININE 0.81 04/13/2021 0844   CREATININE 1.2 03/27/2017 0838      Component Value Date/Time   CALCIUM 9.1 04/13/2021 0844   CALCIUM 9.9 03/27/2017 0838   ALKPHOS 82 04/13/2021 0844   ALKPHOS 79 03/27/2017 0838   AST 16 04/13/2021 0844   AST 25 03/27/2017 0838   ALT 17 04/13/2021 0844   ALT 26 03/27/2017 0838   BILITOT 0.6 04/13/2021 0844   BILITOT 0.31 03/27/2017 0838      Results for Travis, Nguyen (MRN [id number]) as of 04/20/2021 08:45  Ref. Range 01/05/2021 10:14 02/16/2021 11:27 04/13/2021 08:44  M Protein SerPl Elph-Mcnc Latest Ref Range: Not Observed g/dL 0.9 (H) 0.6 (H) 0.6 (H)  IFE 1 Unknown Comment (A) Comment (A) Comment (A)  Globulin, Total Latest Ref Range: 2.2 - 3.9 g/dL 2.7 2.8 2.3  B-Globulin SerPl Elph-Mcnc Latest Ref Range: 0.7 - 1.3 g/dL 0.8 0.9 0.7  IgG (Immunoglobin G), Serum Latest Ref Range: 603 - 1,613 mg/dL 1,129 1,002 933  IgM (Immunoglobulin M), Srm Latest Ref Range: 20 - 172 mg/dL 16 (L) 16 (L) 10 (L)  IgA Latest Ref Range: 61 - 437 mg/dL 21 (L) 20 (L) 17 (L)    Results for Travis, Nguyen (MRN [id number]) as of 04/20/2021 08:45  Ref. Range  04/06/2021 08:17  Kappa free light chain Latest Ref Range: 3.3 - 19.4 mg/L 14.5  Lambda free light chains Latest Ref Range: 5.7 - 26.3 mg/L 3.0 (L)  Kappa, lambda light chain ratio Latest Ref Range: 0.26 - 1.65  4.83 (H)    Impression and Plan:  70 year old man with:   1.  IgG kappa relapsed multiple myeloma diagnosed in 2022.   He is currently receiving salvage therapy with a reasonable response and tolerance.  Protein studies obtained on September 30 that showed an M spike continues to decline slowly down to 0.6 g/dL.  His IgG level is also normalized.  His kappa to lambda ratio remains slightly elevated.  Risks and benefits of continuing this treatment were discussed.  Potential complications were also reiterated including nausea, fatigue, myelosuppression  among others.  After discussion he is agreeable to proceed.  He is agreeable to continue at this time.   2.  Anemia: Remains mild at this time and asymptomatic.  This is likely related to his plasma cell disorder and chemotherapy.  Hemoglobin today is 11.3 which is consistent with his baseline.   3.  IV access: Port-A-Cath currently in use without any issues.   4.  Antiemetics: Compazine is available to him without any nausea or vomiting.   5.  VZV prophylaxis: No recent reactivation noted.  Acyclovir is available to him.  6. Followup: We will continue to follow weekly to complete current cycle of therapy and in 4 weeks for the next cycle.   30  minutes were spent on this encounter.  Time was dedicated to reviewing laboratory data, disease status update, treatment choices and addressing complication related to cancer and cancer therapy.    Zola Button, MD 10/7/20228:44 AM

## 2021-04-24 ENCOUNTER — Other Ambulatory Visit (HOSPITAL_COMMUNITY): Payer: Self-pay

## 2021-04-24 DIAGNOSIS — M5416 Radiculopathy, lumbar region: Secondary | ICD-10-CM | POA: Diagnosis not present

## 2021-04-24 DIAGNOSIS — I10 Essential (primary) hypertension: Secondary | ICD-10-CM | POA: Diagnosis not present

## 2021-04-24 DIAGNOSIS — Z23 Encounter for immunization: Secondary | ICD-10-CM | POA: Diagnosis not present

## 2021-04-24 MED ORDER — ESCITALOPRAM OXALATE 10 MG PO TABS
ORAL_TABLET | ORAL | 1 refills | Status: DC
  Filled 2021-04-24 – 2021-06-26 (×2): qty 90, 90d supply, fill #0
  Filled 2021-09-27: qty 90, 90d supply, fill #1

## 2021-04-24 MED ORDER — OXYCODONE-ACETAMINOPHEN 10-325 MG PO TABS
ORAL_TABLET | ORAL | 0 refills | Status: DC
  Filled 2021-05-08 – 2021-05-09 (×2): qty 60, 30d supply, fill #0
  Filled ????-??-??: fill #0

## 2021-04-24 MED ORDER — LOSARTAN POTASSIUM 100 MG PO TABS
ORAL_TABLET | ORAL | 1 refills | Status: DC
  Filled 2021-04-24 – 2021-06-26 (×2): qty 90, 90d supply, fill #0
  Filled 2021-09-27: qty 90, 90d supply, fill #1

## 2021-04-24 MED ORDER — AMLODIPINE BESYLATE 10 MG PO TABS
ORAL_TABLET | ORAL | 1 refills | Status: DC
  Filled 2021-04-24: qty 90, 90d supply, fill #0
  Filled 2021-06-26: qty 90, 90d supply, fill #1

## 2021-04-27 ENCOUNTER — Inpatient Hospital Stay: Payer: Medicare Other

## 2021-04-27 ENCOUNTER — Other Ambulatory Visit (HOSPITAL_COMMUNITY): Payer: Self-pay

## 2021-04-27 ENCOUNTER — Other Ambulatory Visit: Payer: Medicare Other

## 2021-04-27 ENCOUNTER — Other Ambulatory Visit: Payer: Self-pay

## 2021-04-27 VITALS — BP 148/95 | HR 68 | Temp 98.5°F | Resp 18 | Wt 158.0 lb

## 2021-04-27 DIAGNOSIS — Z79899 Other long term (current) drug therapy: Secondary | ICD-10-CM | POA: Diagnosis not present

## 2021-04-27 DIAGNOSIS — C9001 Multiple myeloma in remission: Secondary | ICD-10-CM

## 2021-04-27 DIAGNOSIS — Z5112 Encounter for antineoplastic immunotherapy: Secondary | ICD-10-CM | POA: Diagnosis not present

## 2021-04-27 DIAGNOSIS — D649 Anemia, unspecified: Secondary | ICD-10-CM | POA: Diagnosis not present

## 2021-04-27 DIAGNOSIS — C9002 Multiple myeloma in relapse: Secondary | ICD-10-CM | POA: Diagnosis not present

## 2021-04-27 DIAGNOSIS — Z95828 Presence of other vascular implants and grafts: Secondary | ICD-10-CM

## 2021-04-27 DIAGNOSIS — C61 Malignant neoplasm of prostate: Secondary | ICD-10-CM

## 2021-04-27 LAB — CBC WITH DIFFERENTIAL (CANCER CENTER ONLY)
Abs Immature Granulocytes: 0.01 10*3/uL (ref 0.00–0.07)
Basophils Absolute: 0 10*3/uL (ref 0.0–0.1)
Basophils Relative: 1 %
Eosinophils Absolute: 0.1 10*3/uL (ref 0.0–0.5)
Eosinophils Relative: 1 %
HCT: 34.2 % — ABNORMAL LOW (ref 39.0–52.0)
Hemoglobin: 11.7 g/dL — ABNORMAL LOW (ref 13.0–17.0)
Immature Granulocytes: 0 %
Lymphocytes Relative: 30 %
Lymphs Abs: 1.3 10*3/uL (ref 0.7–4.0)
MCH: 34.2 pg — ABNORMAL HIGH (ref 26.0–34.0)
MCHC: 34.2 g/dL (ref 30.0–36.0)
MCV: 100 fL (ref 80.0–100.0)
Monocytes Absolute: 0.6 10*3/uL (ref 0.1–1.0)
Monocytes Relative: 13 %
Neutro Abs: 2.4 10*3/uL (ref 1.7–7.7)
Neutrophils Relative %: 55 %
Platelet Count: 168 10*3/uL (ref 150–400)
RBC: 3.42 MIL/uL — ABNORMAL LOW (ref 4.22–5.81)
RDW: 12.4 % (ref 11.5–15.5)
WBC Count: 4.3 10*3/uL (ref 4.0–10.5)
nRBC: 0 % (ref 0.0–0.2)

## 2021-04-27 LAB — CMP (CANCER CENTER ONLY)
ALT: 24 U/L (ref 0–44)
AST: 22 U/L (ref 15–41)
Albumin: 4.3 g/dL (ref 3.5–5.0)
Alkaline Phosphatase: 65 U/L (ref 38–126)
Anion gap: 7 (ref 5–15)
BUN: 17 mg/dL (ref 8–23)
CO2: 24 mmol/L (ref 22–32)
Calcium: 9 mg/dL (ref 8.9–10.3)
Chloride: 101 mmol/L (ref 98–111)
Creatinine: 0.89 mg/dL (ref 0.61–1.24)
GFR, Estimated: >60 mL/min (ref >60)
Glucose, Bld: 97 mg/dL (ref 70–99)
Potassium: 4.3 mmol/L (ref 3.5–5.1)
Sodium: 132 mmol/L — ABNORMAL LOW (ref 135–145)
Total Bilirubin: 0.8 mg/dL (ref 0.3–1.2)
Total Protein: 7.2 g/dL (ref 6.5–8.1)

## 2021-04-27 MED ORDER — DEXTROSE 5 % IV SOLN
56.0000 mg/m2 | Freq: Once | INTRAVENOUS | Status: AC
  Administered 2021-04-27: 110 mg via INTRAVENOUS
  Filled 2021-04-27: qty 30

## 2021-04-27 MED ORDER — DIPHENHYDRAMINE HCL 25 MG PO CAPS
50.0000 mg | ORAL_CAPSULE | Freq: Once | ORAL | Status: AC
  Administered 2021-04-27: 50 mg via ORAL
  Filled 2021-04-27: qty 2

## 2021-04-27 MED ORDER — SODIUM CHLORIDE 0.9% FLUSH
10.0000 mL | INTRAVENOUS | Status: DC | PRN
  Administered 2021-04-27: 10 mL

## 2021-04-27 MED ORDER — SODIUM CHLORIDE 0.9% FLUSH: 10.0000 mL | INTRAVENOUS | Status: DC | PRN

## 2021-04-27 MED ORDER — SODIUM CHLORIDE 0.9 % IV SOLN: Freq: Once | INTRAVENOUS | Status: AC

## 2021-04-27 MED ORDER — ACETAMINOPHEN 325 MG PO TABS
650.0000 mg | ORAL_TABLET | Freq: Once | ORAL | Status: AC
  Administered 2021-04-27: 650 mg via ORAL
  Filled 2021-04-27: qty 2

## 2021-04-27 MED ORDER — DARATUMUMAB-HYALURONIDASE-FIHJ 1800-30000 MG-UT/15ML ~~LOC~~ SOLN
1800.0000 mg | Freq: Once | SUBCUTANEOUS | Status: AC
  Administered 2021-04-27: 1800 mg via SUBCUTANEOUS
  Filled 2021-04-27: qty 15

## 2021-04-27 MED ORDER — HEPARIN SOD (PORK) LOCK FLUSH 100 UNIT/ML IV SOLN
500.0000 [IU] | Freq: Once | INTRAVENOUS | Status: AC | PRN
  Administered 2021-04-27: 500 [IU]

## 2021-04-27 MED ORDER — SODIUM CHLORIDE 0.9 % IV SOLN
40.0000 mg | Freq: Once | INTRAVENOUS | Status: AC
  Administered 2021-04-27: 40 mg via INTRAVENOUS
  Filled 2021-04-27: qty 4

## 2021-04-27 NOTE — Progress Notes (Signed)
Per [name], ok to proceed with treatment without CMP results.

## 2021-04-27 NOTE — Patient Instructions (Signed)
[facility]  Discharge Instructions: Thank you for choosing [facility] to provide your oncology and hematology care.   If you have a lab appointment with the [facility], please go directly to the [facility] and check in at the registration area.   Wear comfortable clothing and clothing appropriate for easy access to any Portacath or PICC line.   We strive to give you quality time with your provider. You may need to reschedule your appointment if you arrive late (15 or more minutes).  Arriving late affects you and other patients whose appointments are after yours.  Also, if you miss three or more appointments without notifying the office, you may be dismissed from the clinic at the provider's discretion.      For prescription refill requests, have your pharmacy contact our office and allow 72 hours for refills to be completed.    Today you received the following chemotherapy and/or immunotherapy agents: kyprolis and darzalex      To help prevent nausea and vomiting after your treatment, we encourage you to take your nausea medication as directed.  BELOW ARE SYMPTOMS THAT SHOULD BE REPORTED IMMEDIATELY: *FEVER GREATER THAN 100.4 F (38 C) OR HIGHER *CHILLS OR SWEATING *NAUSEA AND VOMITING THAT IS NOT CONTROLLED WITH YOUR NAUSEA MEDICATION *UNUSUAL SHORTNESS OF BREATH *UNUSUAL BRUISING OR BLEEDING *URINARY PROBLEMS (pain or burning when urinating, or frequent urination) *BOWEL PROBLEMS (unusual diarrhea, constipation, pain near the anus) TENDERNESS IN MOUTH AND THROAT WITH OR WITHOUT PRESENCE OF ULCERS (sore throat, sores in mouth, or a toothache) UNUSUAL RASH, SWELLING OR PAIN  UNUSUAL VAGINAL DISCHARGE OR ITCHING   Items with * indicate a potential emergency and should be followed up as soon as possible or go to the Emergency Department if any problems should occur.  Please show the CHEMOTHERAPY ALERT CARD or IMMUNOTHERAPY ALERT CARD at  check-in to the Emergency Department and triage nurse.  Should you have questions after your visit or need to cancel or reschedule your appointment, please contact [facility]  Dept: [phone]  and follow the prompts.  Office hours are 8:00 a.m. to 4:30 p.m. Monday - Friday. Please note that voicemails left after 4:00 p.m. may not be returned until the following business day.  We are closed weekends and major holidays. You have access to a nurse at all times for urgent questions. Please call the main number to the clinic Dept: [phone] and follow the prompts.   For any non-urgent questions, you may also contact your provider using MyChart. We now offer e-Visits for anyone 6 and older to request care online for non-urgent symptoms. For details visit [email or web address].   Also download the MyChart app! Go to the app store, search "MyChart", open the app, select [facility], and log in with your MyChart username and password.  Due to Covid, a mask is required upon entering the hospital/clinic. If you do not have a mask, one will be given to you upon arrival. For doctor visits, patients may have 1 support person aged 77 or older with them. For treatment visits, patients cannot have anyone with them due to current Covid guidelines and our immunocompromised population.

## 2021-05-04 ENCOUNTER — Inpatient Hospital Stay: Payer: Medicare Other

## 2021-05-04 ENCOUNTER — Other Ambulatory Visit: Payer: Self-pay

## 2021-05-04 VITALS — HR 72 | Temp 98.2°F | Resp 17 | Wt 160.5 lb

## 2021-05-04 DIAGNOSIS — C9001 Multiple myeloma in remission: Secondary | ICD-10-CM

## 2021-05-04 DIAGNOSIS — C9002 Multiple myeloma in relapse: Secondary | ICD-10-CM | POA: Diagnosis not present

## 2021-05-04 DIAGNOSIS — Z95828 Presence of other vascular implants and grafts: Secondary | ICD-10-CM

## 2021-05-04 DIAGNOSIS — Z5112 Encounter for antineoplastic immunotherapy: Secondary | ICD-10-CM | POA: Diagnosis not present

## 2021-05-04 DIAGNOSIS — Z79899 Other long term (current) drug therapy: Secondary | ICD-10-CM | POA: Diagnosis not present

## 2021-05-04 DIAGNOSIS — C61 Malignant neoplasm of prostate: Secondary | ICD-10-CM

## 2021-05-04 DIAGNOSIS — D649 Anemia, unspecified: Secondary | ICD-10-CM | POA: Diagnosis not present

## 2021-05-04 LAB — CBC WITH DIFFERENTIAL (CANCER CENTER ONLY)
Abs Immature Granulocytes: 0.01 10*3/uL (ref 0.00–0.07)
Basophils Absolute: 0 10*3/uL (ref 0.0–0.1)
Basophils Relative: 0 %
Eosinophils Absolute: 0.1 10*3/uL (ref 0.0–0.5)
Eosinophils Relative: 1 %
HCT: 33.3 % — ABNORMAL LOW (ref 39.0–52.0)
Hemoglobin: 11.3 g/dL — ABNORMAL LOW (ref 13.0–17.0)
Immature Granulocytes: 0 %
Lymphocytes Relative: 23 %
Lymphs Abs: 1.3 10*3/uL (ref 0.7–4.0)
MCH: 34.2 pg — ABNORMAL HIGH (ref 26.0–34.0)
MCHC: 33.9 g/dL (ref 30.0–36.0)
MCV: 100.9 fL — ABNORMAL HIGH (ref 80.0–100.0)
Monocytes Absolute: 0.5 10*3/uL (ref 0.1–1.0)
Monocytes Relative: 9 %
Neutro Abs: 3.8 10*3/uL (ref 1.7–7.7)
Neutrophils Relative %: 67 %
Platelet Count: 162 10*3/uL (ref 150–400)
RBC: 3.3 MIL/uL — ABNORMAL LOW (ref 4.22–5.81)
RDW: 12.3 % (ref 11.5–15.5)
WBC Count: 5.7 10*3/uL (ref 4.0–10.5)
nRBC: 0 % (ref 0.0–0.2)

## 2021-05-04 LAB — CMP (CANCER CENTER ONLY)
ALT: 50 U/L — ABNORMAL HIGH (ref 0–44)
AST: 28 U/L (ref 15–41)
Albumin: 4.2 g/dL (ref 3.5–5.0)
Alkaline Phosphatase: 67 U/L (ref 38–126)
Anion gap: 6 (ref 5–15)
BUN: 18 mg/dL (ref 8–23)
CO2: 27 mmol/L (ref 22–32)
Calcium: 8.9 mg/dL (ref 8.9–10.3)
Chloride: 103 mmol/L (ref 98–111)
Creatinine: 0.96 mg/dL (ref 0.61–1.24)
GFR, Estimated: >60 mL/min (ref >60)
Glucose, Bld: 91 mg/dL (ref 70–99)
Potassium: 4.5 mmol/L (ref 3.5–5.1)
Sodium: 136 mmol/L (ref 135–145)
Total Bilirubin: 0.6 mg/dL (ref 0.3–1.2)
Total Protein: 7.2 g/dL (ref 6.5–8.1)

## 2021-05-04 MED ORDER — SODIUM CHLORIDE 0.9% FLUSH
10.0000 mL | INTRAVENOUS | Status: DC | PRN
  Administered 2021-05-04: 10 mL

## 2021-05-04 MED ORDER — SODIUM CHLORIDE 0.9 % IV SOLN: Freq: Once | INTRAVENOUS | Status: AC

## 2021-05-04 MED ORDER — DEXTROSE 5 % IV SOLN
56.0000 mg/m2 | Freq: Once | INTRAVENOUS | Status: AC
  Administered 2021-05-04: 110 mg via INTRAVENOUS
  Filled 2021-05-04: qty 15

## 2021-05-04 MED ORDER — SODIUM CHLORIDE 0.9 % IV SOLN
40.0000 mg | Freq: Once | INTRAVENOUS | Status: AC
  Administered 2021-05-04: 40 mg via INTRAVENOUS
  Filled 2021-05-04: qty 4

## 2021-05-04 NOTE — Progress Notes (Signed)
Ok to proceed with treatment without CMP today per Dr Alen Blew

## 2021-05-04 NOTE — Patient Instructions (Signed)
[facility]  Discharge Instructions: Thank you for choosing [facility] to provide your oncology and hematology care.   If you have a lab appointment with the Cancer Center, please go directly to the Cancer Center and check in at the registration area.   Wear comfortable clothing and clothing appropriate for easy access to any Portacath or PICC line.   We strive to give you quality time with your provider. You may need to reschedule your appointment if you arrive late (15 or more minutes).  Arriving late affects you and other patients whose appointments are after yours.  Also, if you miss three or more appointments without notifying the office, you may be dismissed from the clinic at the provider's discretion.      For prescription refill requests, have your pharmacy contact our office and allow 72 hours for refills to be completed.    Today you received the following chemotherapy and/or immunotherapy agent: Carfilzomib (Kyprolis).    To help prevent nausea and vomiting after your treatment, we encourage you to take your nausea medication as directed.  BELOW ARE SYMPTOMS THAT SHOULD BE REPORTED IMMEDIATELY: *FEVER GREATER THAN 100.4 F (38 C) OR HIGHER *CHILLS OR SWEATING *NAUSEA AND VOMITING THAT IS NOT CONTROLLED WITH YOUR NAUSEA MEDICATION *UNUSUAL SHORTNESS OF BREATH *UNUSUAL BRUISING OR BLEEDING *URINARY PROBLEMS (pain or burning when urinating, or frequent urination) *BOWEL PROBLEMS (unusual diarrhea, constipation, pain near the anus) TENDERNESS IN MOUTH AND THROAT WITH OR WITHOUT PRESENCE OF ULCERS (sore throat, sores in mouth, or a toothache) UNUSUAL RASH, SWELLING OR PAIN  UNUSUAL VAGINAL DISCHARGE OR ITCHING   Items with * indicate a potential emergency and should be followed up as soon as possible or go to the Emergency Department if any problems should occur.  Please show the CHEMOTHERAPY ALERT CARD or IMMUNOTHERAPY ALERT CARD at  check-in to the Emergency Department and triage nurse.  Should you have questions after your visit or need to cancel or reschedule your appointment, please contact [facility]  Dept: [phone]  and follow the prompts.  Office hours are 8:00 a.m. to 4:30 p.m. Monday - Friday. Please note that voicemails left after 4:00 p.m. may not be returned until the following business day.  We are closed weekends and major holidays. You have access to a nurse at all times for urgent questions. Please call the main number to the clinic Dept: [phone] and follow the prompts.   For any non-urgent questions, you may also contact your provider using MyChart. We now offer e-Visits for anyone 18 and older to request care online for non-urgent symptoms. For details visit [email or web address].   Also download the MyChart app! Go to the app store, search "MyChart", open the app, select , and log in with your MyChart username and password.  Due to Covid, a mask is required upon entering the hospital/clinic. If you do not have a mask, one will be given to you upon arrival. For doctor visits, patients may have 1 support person aged 18 or older with them. For treatment visits, patients cannot have anyone with them due to current Covid guidelines and our immunocompromised population.   

## 2021-05-08 ENCOUNTER — Other Ambulatory Visit (HOSPITAL_COMMUNITY): Payer: Self-pay

## 2021-05-09 ENCOUNTER — Other Ambulatory Visit (HOSPITAL_COMMUNITY): Payer: Self-pay

## 2021-05-11 ENCOUNTER — Other Ambulatory Visit: Payer: Self-pay

## 2021-05-11 ENCOUNTER — Inpatient Hospital Stay: Payer: Medicare Other

## 2021-05-11 VITALS — BP 122/78 | HR 71 | Temp 99.0°F | Resp 17

## 2021-05-11 DIAGNOSIS — C9001 Multiple myeloma in remission: Secondary | ICD-10-CM

## 2021-05-11 DIAGNOSIS — Z79899 Other long term (current) drug therapy: Secondary | ICD-10-CM | POA: Diagnosis not present

## 2021-05-11 DIAGNOSIS — D649 Anemia, unspecified: Secondary | ICD-10-CM | POA: Diagnosis not present

## 2021-05-11 DIAGNOSIS — C9002 Multiple myeloma in relapse: Secondary | ICD-10-CM | POA: Diagnosis not present

## 2021-05-11 DIAGNOSIS — Z5112 Encounter for antineoplastic immunotherapy: Secondary | ICD-10-CM | POA: Diagnosis not present

## 2021-05-11 LAB — CMP (CANCER CENTER ONLY)
ALT: 49 U/L — ABNORMAL HIGH (ref 0–44)
AST: 43 U/L — ABNORMAL HIGH (ref 15–41)
Albumin: 4 g/dL (ref 3.5–5.0)
Alkaline Phosphatase: 75 U/L (ref 38–126)
Anion gap: 7 (ref 5–15)
BUN: 18 mg/dL (ref 8–23)
CO2: 25 mmol/L (ref 22–32)
Calcium: 9 mg/dL (ref 8.9–10.3)
Chloride: 105 mmol/L (ref 98–111)
Creatinine: 0.89 mg/dL (ref 0.61–1.24)
GFR, Estimated: >60 mL/min (ref >60)
Glucose, Bld: 92 mg/dL (ref 70–99)
Potassium: 4.3 mmol/L (ref 3.5–5.1)
Sodium: 137 mmol/L (ref 135–145)
Total Bilirubin: 0.5 mg/dL (ref 0.3–1.2)
Total Protein: 7 g/dL (ref 6.5–8.1)

## 2021-05-11 LAB — CBC WITH DIFFERENTIAL (CANCER CENTER ONLY)
Abs Immature Granulocytes: 0.02 10*3/uL (ref 0.00–0.07)
Basophils Absolute: 0 10*3/uL (ref 0.0–0.1)
Basophils Relative: 0 %
Eosinophils Absolute: 0 10*3/uL (ref 0.0–0.5)
Eosinophils Relative: 1 %
HCT: 32.2 % — ABNORMAL LOW (ref 39.0–52.0)
Hemoglobin: 11 g/dL — ABNORMAL LOW (ref 13.0–17.0)
Immature Granulocytes: 0 %
Lymphocytes Relative: 24 %
Lymphs Abs: 1.1 10*3/uL (ref 0.7–4.0)
MCH: 34.4 pg — ABNORMAL HIGH (ref 26.0–34.0)
MCHC: 34.2 g/dL (ref 30.0–36.0)
MCV: 100.6 fL — ABNORMAL HIGH (ref 80.0–100.0)
Monocytes Absolute: 0.5 10*3/uL (ref 0.1–1.0)
Monocytes Relative: 10 %
Neutro Abs: 3 10*3/uL (ref 1.7–7.7)
Neutrophils Relative %: 65 %
Platelet Count: 177 10*3/uL (ref 150–400)
RBC: 3.2 MIL/uL — ABNORMAL LOW (ref 4.22–5.81)
RDW: 12.2 % (ref 11.5–15.5)
WBC Count: 4.6 10*3/uL (ref 4.0–10.5)
nRBC: 0 % (ref 0.0–0.2)

## 2021-05-11 MED ORDER — SODIUM CHLORIDE 0.9 % IV SOLN: Freq: Once | INTRAVENOUS | Status: AC

## 2021-05-11 MED ORDER — ACETAMINOPHEN 325 MG PO TABS
650.0000 mg | ORAL_TABLET | Freq: Once | ORAL | Status: AC
  Administered 2021-05-11: 650 mg via ORAL
  Filled 2021-05-11: qty 2

## 2021-05-11 MED ORDER — DARATUMUMAB-HYALURONIDASE-FIHJ 1800-30000 MG-UT/15ML ~~LOC~~ SOLN
1800.0000 mg | Freq: Once | SUBCUTANEOUS | Status: AC
  Administered 2021-05-11: 1800 mg via SUBCUTANEOUS
  Filled 2021-05-11: qty 15

## 2021-05-11 MED ORDER — HEPARIN SOD (PORK) LOCK FLUSH 100 UNIT/ML IV SOLN
500.0000 [IU] | Freq: Once | INTRAVENOUS | Status: AC | PRN
  Administered 2021-05-11: 500 [IU]

## 2021-05-11 MED ORDER — DIPHENHYDRAMINE HCL 25 MG PO CAPS
50.0000 mg | ORAL_CAPSULE | Freq: Once | ORAL | Status: AC
  Administered 2021-05-11: 50 mg via ORAL
  Filled 2021-05-11: qty 2

## 2021-05-11 MED ORDER — DEXTROSE 5 % IV SOLN
56.0000 mg/m2 | Freq: Once | INTRAVENOUS | Status: AC
  Administered 2021-05-11: 110 mg via INTRAVENOUS
  Filled 2021-05-11: qty 10

## 2021-05-11 MED ORDER — SODIUM CHLORIDE 0.9 % IV SOLN
40.0000 mg | Freq: Once | INTRAVENOUS | Status: AC
  Administered 2021-05-11: 40 mg via INTRAVENOUS
  Filled 2021-05-11: qty 4

## 2021-05-11 MED ORDER — SODIUM CHLORIDE 0.9% FLUSH
10.0000 mL | INTRAVENOUS | Status: DC | PRN
Start: 2021-05-11 — End: 2021-05-11
  Administered 2021-05-11: 10 mL

## 2021-05-11 NOTE — Patient Instructions (Signed)
[facility]  Discharge Instructions: Thank you for choosing [facility] to provide your oncology and hematology care.   If you have a lab appointment with the [facility], please go directly to the [facility] and check in at the registration area.   Wear comfortable clothing and clothing appropriate for easy access to any Portacath or PICC line.   We strive to give you quality time with your provider. You may need to reschedule your appointment if you arrive late (15 or more minutes).  Arriving late affects you and other patients whose appointments are after yours.  Also, if you miss three or more appointments without notifying the office, you may be dismissed from the clinic at the provider's discretion.      For prescription refill requests, have your pharmacy contact our office and allow 72 hours for refills to be completed.    Today you received the following chemotherapy and/or immunotherapy agents: Kyprolis & Darzalex Faspro      To help prevent nausea and vomiting after your treatment, we encourage you to take your nausea medication as directed.  BELOW ARE SYMPTOMS THAT SHOULD BE REPORTED IMMEDIATELY: *FEVER GREATER THAN 100.4 F (38 C) OR HIGHER *CHILLS OR SWEATING *NAUSEA AND VOMITING THAT IS NOT CONTROLLED WITH YOUR NAUSEA MEDICATION *UNUSUAL SHORTNESS OF BREATH *UNUSUAL BRUISING OR BLEEDING *URINARY PROBLEMS (pain or burning when urinating, or frequent urination) *BOWEL PROBLEMS (unusual diarrhea, constipation, pain near the anus) TENDERNESS IN MOUTH AND THROAT WITH OR WITHOUT PRESENCE OF ULCERS (sore throat, sores in mouth, or a toothache) UNUSUAL RASH, SWELLING OR PAIN  UNUSUAL VAGINAL DISCHARGE OR ITCHING   Items with * indicate a potential emergency and should be followed up as soon as possible or go to the Emergency Department if any problems should occur.  Please show the CHEMOTHERAPY ALERT CARD or IMMUNOTHERAPY ALERT  CARD at check-in to the Emergency Department and triage nurse.  Should you have questions after your visit or need to cancel or reschedule your appointment, please contact [facility]  Dept: [phone]  and follow the prompts.  Office hours are 8:00 a.m. to 4:30 p.m. Monday - Friday. Please note that voicemails left after 4:00 p.m. may not be returned until the following business day.  We are closed weekends and major holidays. You have access to a nurse at all times for urgent questions. Please call the main number to the clinic Dept: [phone] and follow the prompts.   For any non-urgent questions, you may also contact your provider using MyChart. We now offer e-Visits for anyone 56 and older to request care online for non-urgent symptoms. For details visit [email or web address].   Also download the MyChart app! Go to the app store, search "MyChart", open the app, select [facility], and log in with your MyChart username and password.  Due to Covid, a mask is required upon entering the hospital/clinic. If you do not have a mask, one will be given to you upon arrival. For doctor visits, patients may have 1 support person aged 78 or older with them. For treatment visits, patients cannot have anyone with them due to current Covid guidelines and our immunocompromised population.

## 2021-05-18 ENCOUNTER — Other Ambulatory Visit: Payer: Self-pay

## 2021-05-18 ENCOUNTER — Inpatient Hospital Stay: Payer: Medicare Other | Attending: Oncology

## 2021-05-18 ENCOUNTER — Inpatient Hospital Stay: Payer: Medicare Other

## 2021-05-18 VITALS — BP 140/78 | HR 67 | Temp 98.7°F | Resp 17 | Wt 161.5 lb

## 2021-05-18 DIAGNOSIS — Z79899 Other long term (current) drug therapy: Secondary | ICD-10-CM | POA: Diagnosis not present

## 2021-05-18 DIAGNOSIS — D649 Anemia, unspecified: Secondary | ICD-10-CM | POA: Diagnosis not present

## 2021-05-18 DIAGNOSIS — C9002 Multiple myeloma in relapse: Secondary | ICD-10-CM | POA: Insufficient documentation

## 2021-05-18 DIAGNOSIS — C61 Malignant neoplasm of prostate: Secondary | ICD-10-CM | POA: Diagnosis not present

## 2021-05-18 DIAGNOSIS — Z95828 Presence of other vascular implants and grafts: Secondary | ICD-10-CM

## 2021-05-18 DIAGNOSIS — Z886 Allergy status to analgesic agent status: Secondary | ICD-10-CM | POA: Diagnosis not present

## 2021-05-18 DIAGNOSIS — C9001 Multiple myeloma in remission: Secondary | ICD-10-CM

## 2021-05-18 DIAGNOSIS — Z5112 Encounter for antineoplastic immunotherapy: Secondary | ICD-10-CM | POA: Diagnosis not present

## 2021-05-18 LAB — CMP (CANCER CENTER ONLY)
ALT: 62 U/L — ABNORMAL HIGH (ref 0–44)
AST: 20 U/L (ref 15–41)
Albumin: 3.8 g/dL (ref 3.5–5.0)
Alkaline Phosphatase: 74 U/L (ref 38–126)
Anion gap: 8 (ref 5–15)
BUN: 15 mg/dL (ref 8–23)
CO2: 23 mmol/L (ref 22–32)
Calcium: 8.3 mg/dL — ABNORMAL LOW (ref 8.9–10.3)
Chloride: 106 mmol/L (ref 98–111)
Creatinine: 0.84 mg/dL (ref 0.61–1.24)
GFR, Estimated: >60 mL/min (ref >60)
Glucose, Bld: 104 mg/dL — ABNORMAL HIGH (ref 70–99)
Potassium: 4.3 mmol/L (ref 3.5–5.1)
Sodium: 137 mmol/L (ref 135–145)
Total Bilirubin: 0.4 mg/dL (ref 0.3–1.2)
Total Protein: 6.5 g/dL (ref 6.5–8.1)

## 2021-05-18 LAB — CBC WITH DIFFERENTIAL (CANCER CENTER ONLY)
Abs Immature Granulocytes: 0.03 10*3/uL (ref 0.00–0.07)
Basophils Absolute: 0 10*3/uL (ref 0.0–0.1)
Basophils Relative: 0 %
Eosinophils Absolute: 0 10*3/uL (ref 0.0–0.5)
Eosinophils Relative: 0 %
HCT: 30.9 % — ABNORMAL LOW (ref 39.0–52.0)
Hemoglobin: 10.7 g/dL — ABNORMAL LOW (ref 13.0–17.0)
Immature Granulocytes: 0 %
Lymphocytes Relative: 12 %
Lymphs Abs: 1.1 10*3/uL (ref 0.7–4.0)
MCH: 35 pg — ABNORMAL HIGH (ref 26.0–34.0)
MCHC: 34.6 g/dL (ref 30.0–36.0)
MCV: 101 fL — ABNORMAL HIGH (ref 80.0–100.0)
Monocytes Absolute: 0.5 10*3/uL (ref 0.1–1.0)
Monocytes Relative: 6 %
Neutro Abs: 7.4 10*3/uL (ref 1.7–7.7)
Neutrophils Relative %: 82 %
Platelet Count: 181 10*3/uL (ref 150–400)
RBC: 3.06 MIL/uL — ABNORMAL LOW (ref 4.22–5.81)
RDW: 12.2 % (ref 11.5–15.5)
WBC Count: 9.1 10*3/uL (ref 4.0–10.5)
nRBC: 0 % (ref 0.0–0.2)

## 2021-05-18 MED ORDER — SODIUM CHLORIDE 0.9% FLUSH
10.0000 mL | INTRAVENOUS | Status: DC | PRN
  Administered 2021-05-18: 10 mL

## 2021-05-18 MED ORDER — SODIUM CHLORIDE 0.9 % IV SOLN: Freq: Once | INTRAVENOUS | Status: AC

## 2021-05-18 MED ORDER — HEPARIN SOD (PORK) LOCK FLUSH 100 UNIT/ML IV SOLN
500.0000 [IU] | Freq: Once | INTRAVENOUS | Status: AC | PRN
  Administered 2021-05-18: 500 [IU]

## 2021-05-18 MED ORDER — SODIUM CHLORIDE 0.9 % IV SOLN
40.0000 mg | Freq: Once | INTRAVENOUS | Status: AC
  Administered 2021-05-18: 40 mg via INTRAVENOUS
  Filled 2021-05-18: qty 4

## 2021-05-18 MED ORDER — DEXTROSE 5 % IV SOLN
56.0000 mg/m2 | Freq: Once | INTRAVENOUS | Status: AC
  Administered 2021-05-18: 110 mg via INTRAVENOUS
  Filled 2021-05-18: qty 30

## 2021-05-18 NOTE — Patient Instructions (Signed)
[facility]  Discharge Instructions: Thank you for choosing [facility] to provide your oncology and hematology care.   If you have a lab appointment with the Cancer Center, please go directly to the Cancer Center and check in at the registration area.   Wear comfortable clothing and clothing appropriate for easy access to any Portacath or PICC line.   We strive to give you quality time with your provider. You may need to reschedule your appointment if you arrive late (15 or more minutes).  Arriving late affects you and other patients whose appointments are after yours.  Also, if you miss three or more appointments without notifying the office, you may be dismissed from the clinic at the provider's discretion.      For prescription refill requests, have your pharmacy contact our office and allow 72 hours for refills to be completed.    Today you received the following chemotherapy and/or immunotherapy agents: Kyprolis    To help prevent nausea and vomiting after your treatment, we encourage you to take your nausea medication as directed.  BELOW ARE SYMPTOMS THAT SHOULD BE REPORTED IMMEDIATELY: . *FEVER GREATER THAN 100.4 F (38 C) OR HIGHER . *CHILLS OR SWEATING . *NAUSEA AND VOMITING THAT IS NOT CONTROLLED WITH YOUR NAUSEA MEDICATION . *UNUSUAL SHORTNESS OF BREATH . *UNUSUAL BRUISING OR BLEEDING . *URINARY PROBLEMS (pain or burning when urinating, or frequent urination) . *BOWEL PROBLEMS (unusual diarrhea, constipation, pain near the anus) . TENDERNESS IN MOUTH AND THROAT WITH OR WITHOUT PRESENCE OF ULCERS (sore throat, sores in mouth, or a toothache) . UNUSUAL RASH, SWELLING OR PAIN  . UNUSUAL VAGINAL DISCHARGE OR ITCHING   Items with * indicate a potential emergency and should be followed up as soon as possible or go to the Emergency Department if any problems should occur.  Please show the CHEMOTHERAPY ALERT CARD or IMMUNOTHERAPY ALERT  CARD at check-in to the Emergency Department and triage nurse.  Should you have questions after your visit or need to cancel or reschedule your appointment, please contact [facility]  Dept: [phone]  and follow the prompts.  Office hours are 8:00 a.m. to 4:30 p.m. Monday - Friday. Please note that voicemails left after 4:00 p.m. may not be returned until the following business day.  We are closed weekends and major holidays. You have access to a nurse at all times for urgent questions. Please call the main number to the clinic Dept: [phone] and follow the prompts.   For any non-urgent questions, you may also contact your provider using MyChart. We now offer e-Visits for anyone 18 and older to request care online for non-urgent symptoms. For details visit [email or web address].   Also download the MyChart app! Go to the app store, search "MyChart", open the app, select [facility], and log in with your MyChart username and password.  Due to Covid, a mask is required upon entering the hospital/clinic. If you do not have a mask, one will be given to you upon arrival. For doctor visits, patients may have 1 support person aged 18 or older with them. For treatment visits, patients cannot have anyone with them due to current Covid guidelines and our immunocompromised population.   

## 2021-05-24 ENCOUNTER — Other Ambulatory Visit (HOSPITAL_COMMUNITY): Payer: Self-pay

## 2021-05-24 MED ORDER — OXYCODONE-ACETAMINOPHEN 10-325 MG PO TABS
ORAL_TABLET | ORAL | 0 refills | Status: DC
  Filled 2021-06-05: qty 60, 30d supply, fill #0
  Filled ????-??-??: fill #0

## 2021-05-25 ENCOUNTER — Ambulatory Visit: Payer: Medicare Other

## 2021-05-25 ENCOUNTER — Inpatient Hospital Stay: Payer: Medicare Other

## 2021-05-25 ENCOUNTER — Other Ambulatory Visit: Payer: Medicare Other

## 2021-05-25 ENCOUNTER — Other Ambulatory Visit: Payer: Self-pay

## 2021-05-25 VITALS — BP 124/82 | HR 68 | Temp 98.7°F | Resp 18 | Wt 166.0 lb

## 2021-05-25 DIAGNOSIS — Z79899 Other long term (current) drug therapy: Secondary | ICD-10-CM | POA: Diagnosis not present

## 2021-05-25 DIAGNOSIS — C9001 Multiple myeloma in remission: Secondary | ICD-10-CM

## 2021-05-25 DIAGNOSIS — D649 Anemia, unspecified: Secondary | ICD-10-CM | POA: Diagnosis not present

## 2021-05-25 DIAGNOSIS — C9002 Multiple myeloma in relapse: Secondary | ICD-10-CM | POA: Diagnosis not present

## 2021-05-25 DIAGNOSIS — Z95828 Presence of other vascular implants and grafts: Secondary | ICD-10-CM

## 2021-05-25 DIAGNOSIS — Z886 Allergy status to analgesic agent status: Secondary | ICD-10-CM | POA: Diagnosis not present

## 2021-05-25 DIAGNOSIS — C61 Malignant neoplasm of prostate: Secondary | ICD-10-CM

## 2021-05-25 DIAGNOSIS — Z5112 Encounter for antineoplastic immunotherapy: Secondary | ICD-10-CM | POA: Diagnosis not present

## 2021-05-25 LAB — CBC WITH DIFFERENTIAL (CANCER CENTER ONLY)
Abs Immature Granulocytes: 0.01 10*3/uL (ref 0.00–0.07)
Basophils Absolute: 0 10*3/uL (ref 0.0–0.1)
Basophils Relative: 0 %
Eosinophils Absolute: 0 10*3/uL (ref 0.0–0.5)
Eosinophils Relative: 1 %
HCT: 32.2 % — ABNORMAL LOW (ref 39.0–52.0)
Hemoglobin: 10.8 g/dL — ABNORMAL LOW (ref 13.0–17.0)
Immature Granulocytes: 0 %
Lymphocytes Relative: 29 %
Lymphs Abs: 1.2 10*3/uL (ref 0.7–4.0)
MCH: 34.2 pg — ABNORMAL HIGH (ref 26.0–34.0)
MCHC: 33.5 g/dL (ref 30.0–36.0)
MCV: 101.9 fL — ABNORMAL HIGH (ref 80.0–100.0)
Monocytes Absolute: 0.4 10*3/uL (ref 0.1–1.0)
Monocytes Relative: 8 %
Neutro Abs: 2.6 10*3/uL (ref 1.7–7.7)
Neutrophils Relative %: 62 %
Platelet Count: 166 10*3/uL (ref 150–400)
RBC: 3.16 MIL/uL — ABNORMAL LOW (ref 4.22–5.81)
RDW: 12.1 % (ref 11.5–15.5)
WBC Count: 4.2 10*3/uL (ref 4.0–10.5)
nRBC: 0 % (ref 0.0–0.2)

## 2021-05-25 LAB — CMP (CANCER CENTER ONLY)
ALT: 24 U/L (ref 0–44)
AST: 16 U/L (ref 15–41)
Albumin: 4 g/dL (ref 3.5–5.0)
Alkaline Phosphatase: 73 U/L (ref 38–126)
Anion gap: 9 (ref 5–15)
BUN: 11 mg/dL (ref 8–23)
CO2: 24 mmol/L (ref 22–32)
Calcium: 8.9 mg/dL (ref 8.9–10.3)
Chloride: 105 mmol/L (ref 98–111)
Creatinine: 0.8 mg/dL (ref 0.61–1.24)
GFR, Estimated: >60 mL/min (ref >60)
Glucose, Bld: 95 mg/dL (ref 70–99)
Potassium: 4.3 mmol/L (ref 3.5–5.1)
Sodium: 138 mmol/L (ref 135–145)
Total Bilirubin: 0.5 mg/dL (ref 0.3–1.2)
Total Protein: 6.8 g/dL (ref 6.5–8.1)

## 2021-05-25 MED ORDER — SODIUM CHLORIDE 0.9 % IV SOLN: Freq: Once | INTRAVENOUS | Status: AC

## 2021-05-25 MED ORDER — ACETAMINOPHEN 325 MG PO TABS
650.0000 mg | ORAL_TABLET | Freq: Once | ORAL | Status: AC
  Administered 2021-05-25: 650 mg via ORAL
  Filled 2021-05-25: qty 2

## 2021-05-25 MED ORDER — DARATUMUMAB-HYALURONIDASE-FIHJ 1800-30000 MG-UT/15ML ~~LOC~~ SOLN
1800.0000 mg | Freq: Once | SUBCUTANEOUS | Status: AC
  Administered 2021-05-25: 1800 mg via SUBCUTANEOUS
  Filled 2021-05-25: qty 15

## 2021-05-25 MED ORDER — SODIUM CHLORIDE 0.9% FLUSH
10.0000 mL | INTRAVENOUS | Status: DC | PRN
  Administered 2021-05-25: 10 mL

## 2021-05-25 MED ORDER — HEPARIN SOD (PORK) LOCK FLUSH 100 UNIT/ML IV SOLN
500.0000 [IU] | Freq: Once | INTRAVENOUS | Status: AC | PRN
  Administered 2021-05-25: 500 [IU]

## 2021-05-25 MED ORDER — DEXTROSE 5 % IV SOLN
56.0000 mg/m2 | Freq: Once | INTRAVENOUS | Status: AC
  Administered 2021-05-25: 110 mg via INTRAVENOUS
  Filled 2021-05-25: qty 15

## 2021-05-25 MED ORDER — DIPHENHYDRAMINE HCL 25 MG PO CAPS
50.0000 mg | ORAL_CAPSULE | Freq: Once | ORAL | Status: AC
  Administered 2021-05-25: 50 mg via ORAL
  Filled 2021-05-25: qty 2

## 2021-05-25 MED ORDER — SODIUM CHLORIDE 0.9 % IV SOLN
40.0000 mg | Freq: Once | INTRAVENOUS | Status: AC
  Administered 2021-05-25: 40 mg via INTRAVENOUS
  Filled 2021-05-25: qty 4

## 2021-05-25 NOTE — Patient Instructions (Signed)
[facility]  Discharge Instructions: Thank you for choosing [facility] to provide your oncology and hematology care.   If you have a lab appointment with the Cancer Center, please go directly to the Cancer Center and check in at the registration area.   Wear comfortable clothing and clothing appropriate for easy access to any Portacath or PICC line.   We strive to give you quality time with your provider. You may need to reschedule your appointment if you arrive late (15 or more minutes).  Arriving late affects you and other patients whose appointments are after yours.  Also, if you miss three or more appointments without notifying the office, you may be dismissed from the clinic at the provider's discretion.      For prescription refill requests, have your pharmacy contact our office and allow 72 hours for refills to be completed.    Today you received the following chemotherapy and/or immunotherapy agents: Kyprolis    To help prevent nausea and vomiting after your treatment, we encourage you to take your nausea medication as directed.  BELOW ARE SYMPTOMS THAT SHOULD BE REPORTED IMMEDIATELY: . *FEVER GREATER THAN 100.4 F (38 C) OR HIGHER . *CHILLS OR SWEATING . *NAUSEA AND VOMITING THAT IS NOT CONTROLLED WITH YOUR NAUSEA MEDICATION . *UNUSUAL SHORTNESS OF BREATH . *UNUSUAL BRUISING OR BLEEDING . *URINARY PROBLEMS (pain or burning when urinating, or frequent urination) . *BOWEL PROBLEMS (unusual diarrhea, constipation, pain near the anus) . TENDERNESS IN MOUTH AND THROAT WITH OR WITHOUT PRESENCE OF ULCERS (sore throat, sores in mouth, or a toothache) . UNUSUAL RASH, SWELLING OR PAIN  . UNUSUAL VAGINAL DISCHARGE OR ITCHING   Items with * indicate a potential emergency and should be followed up as soon as possible or go to the Emergency Department if any problems should occur.  Please show the CHEMOTHERAPY ALERT CARD or IMMUNOTHERAPY ALERT  CARD at check-in to the Emergency Department and triage nurse.  Should you have questions after your visit or need to cancel or reschedule your appointment, please contact [facility]  Dept: [phone]  and follow the prompts.  Office hours are 8:00 a.m. to 4:30 p.m. Monday - Friday. Please note that voicemails left after 4:00 p.m. may not be returned until the following business day.  We are closed weekends and major holidays. You have access to a nurse at all times for urgent questions. Please call the main number to the clinic Dept: [phone] and follow the prompts.   For any non-urgent questions, you may also contact your provider using MyChart. We now offer e-Visits for anyone 18 and older to request care online for non-urgent symptoms. For details visit [email or web address].   Also download the MyChart app! Go to the app store, search "MyChart", open the app, select [facility], and log in with your MyChart username and password.  Due to Covid, a mask is required upon entering the hospital/clinic. If you do not have a mask, one will be given to you upon arrival. For doctor visits, patients may have 1 support person aged 18 or older with them. For treatment visits, patients cannot have anyone with them due to current Covid guidelines and our immunocompromised population.   

## 2021-05-28 LAB — KAPPA/LAMBDA LIGHT CHAINS
Kappa free light chain: 14.5 mg/L (ref 3.3–19.4)
Kappa, lambda light chain ratio: 5.37 — ABNORMAL HIGH (ref 0.26–1.65)
Lambda free light chains: 2.7 mg/L — ABNORMAL LOW (ref 5.7–26.3)

## 2021-05-29 LAB — MULTIPLE MYELOMA PANEL, SERUM
Albumin SerPl Elph-Mcnc: 4 g/dL (ref 2.9–4.4)
Albumin/Glob SerPl: 1.6 (ref 0.7–1.7)
Alpha 1: 0.3 g/dL (ref 0.0–0.4)
Alpha2 Glob SerPl Elph-Mcnc: 0.6 g/dL (ref 0.4–1.0)
B-Globulin SerPl Elph-Mcnc: 0.9 g/dL (ref 0.7–1.3)
Gamma Glob SerPl Elph-Mcnc: 0.8 g/dL (ref 0.4–1.8)
Globulin, Total: 2.6 g/dL (ref 2.2–3.9)
IgA: 68 mg/dL (ref 61–437)
IgG (Immunoglobin G), Serum: 872 mg/dL (ref 603–1613)
IgM (Immunoglobulin M), Srm: 54 mg/dL (ref 20–172)
Total Protein ELP: 6.6 g/dL (ref 6.0–8.5)

## 2021-05-31 ENCOUNTER — Other Ambulatory Visit (HOSPITAL_COMMUNITY): Payer: Self-pay

## 2021-06-01 ENCOUNTER — Inpatient Hospital Stay (HOSPITAL_BASED_OUTPATIENT_CLINIC_OR_DEPARTMENT_OTHER): Payer: Medicare Other | Admitting: Oncology

## 2021-06-01 ENCOUNTER — Inpatient Hospital Stay: Payer: Medicare Other

## 2021-06-01 ENCOUNTER — Other Ambulatory Visit: Payer: Self-pay

## 2021-06-01 VITALS — BP 150/83 | HR 74 | Temp 97.7°F | Resp 17 | Wt 163.2 lb

## 2021-06-01 DIAGNOSIS — Z886 Allergy status to analgesic agent status: Secondary | ICD-10-CM | POA: Diagnosis not present

## 2021-06-01 DIAGNOSIS — C9001 Multiple myeloma in remission: Secondary | ICD-10-CM

## 2021-06-01 DIAGNOSIS — C9000 Multiple myeloma not having achieved remission: Secondary | ICD-10-CM | POA: Diagnosis not present

## 2021-06-01 DIAGNOSIS — C61 Malignant neoplasm of prostate: Secondary | ICD-10-CM

## 2021-06-01 DIAGNOSIS — Z95828 Presence of other vascular implants and grafts: Secondary | ICD-10-CM

## 2021-06-01 DIAGNOSIS — Z5112 Encounter for antineoplastic immunotherapy: Secondary | ICD-10-CM | POA: Diagnosis not present

## 2021-06-01 DIAGNOSIS — C9002 Multiple myeloma in relapse: Secondary | ICD-10-CM | POA: Diagnosis not present

## 2021-06-01 DIAGNOSIS — Z79899 Other long term (current) drug therapy: Secondary | ICD-10-CM | POA: Diagnosis not present

## 2021-06-01 DIAGNOSIS — D649 Anemia, unspecified: Secondary | ICD-10-CM | POA: Diagnosis not present

## 2021-06-01 LAB — CBC WITH DIFFERENTIAL (CANCER CENTER ONLY)
Abs Immature Granulocytes: 0.01 10*3/uL (ref 0.00–0.07)
Basophils Absolute: 0 10*3/uL (ref 0.0–0.1)
Basophils Relative: 0 %
Eosinophils Absolute: 0 10*3/uL (ref 0.0–0.5)
Eosinophils Relative: 1 %
HCT: 32.2 % — ABNORMAL LOW (ref 39.0–52.0)
Hemoglobin: 10.8 g/dL — ABNORMAL LOW (ref 13.0–17.0)
Immature Granulocytes: 0 %
Lymphocytes Relative: 29 %
Lymphs Abs: 1.2 10*3/uL (ref 0.7–4.0)
MCH: 34.1 pg — ABNORMAL HIGH (ref 26.0–34.0)
MCHC: 33.5 g/dL (ref 30.0–36.0)
MCV: 101.6 fL — ABNORMAL HIGH (ref 80.0–100.0)
Monocytes Absolute: 0.4 10*3/uL (ref 0.1–1.0)
Monocytes Relative: 10 %
Neutro Abs: 2.6 10*3/uL (ref 1.7–7.7)
Neutrophils Relative %: 60 %
Platelet Count: 157 10*3/uL (ref 150–400)
RBC: 3.17 MIL/uL — ABNORMAL LOW (ref 4.22–5.81)
RDW: 12.2 % (ref 11.5–15.5)
WBC Count: 4.3 10*3/uL (ref 4.0–10.5)
nRBC: 0 % (ref 0.0–0.2)

## 2021-06-01 LAB — CMP (CANCER CENTER ONLY)
ALT: 18 U/L (ref 0–44)
AST: 16 U/L (ref 15–41)
Albumin: 4 g/dL (ref 3.5–5.0)
Alkaline Phosphatase: 77 U/L (ref 38–126)
Anion gap: 7 (ref 5–15)
BUN: 15 mg/dL (ref 8–23)
CO2: 23 mmol/L (ref 22–32)
Calcium: 8.9 mg/dL (ref 8.9–10.3)
Chloride: 108 mmol/L (ref 98–111)
Creatinine: 0.84 mg/dL (ref 0.61–1.24)
GFR, Estimated: >60 mL/min (ref >60)
Glucose, Bld: 98 mg/dL (ref 70–99)
Potassium: 4.4 mmol/L (ref 3.5–5.1)
Sodium: 138 mmol/L (ref 135–145)
Total Bilirubin: 0.4 mg/dL (ref 0.3–1.2)
Total Protein: 6.9 g/dL (ref 6.5–8.1)

## 2021-06-01 MED ORDER — SODIUM CHLORIDE 0.9 % IV SOLN: Freq: Once | INTRAVENOUS | Status: DC

## 2021-06-01 MED ORDER — SODIUM CHLORIDE 0.9 % IV SOLN: Freq: Once | INTRAVENOUS | Status: AC

## 2021-06-01 MED ORDER — SODIUM CHLORIDE 0.9% FLUSH
10.0000 mL | INTRAVENOUS | Status: DC | PRN
  Administered 2021-06-01: 10 mL

## 2021-06-01 MED ORDER — SODIUM CHLORIDE 0.9 % IV SOLN
40.0000 mg | Freq: Once | INTRAVENOUS | Status: AC
  Administered 2021-06-01: 40 mg via INTRAVENOUS
  Filled 2021-06-01: qty 4

## 2021-06-01 MED ORDER — DEXTROSE 5 % IV SOLN
56.0000 mg/m2 | Freq: Once | INTRAVENOUS | Status: AC
  Administered 2021-06-01: 110 mg via INTRAVENOUS
  Filled 2021-06-01: qty 30

## 2021-06-01 NOTE — Patient Instructions (Signed)
[facility]  Discharge Instructions: Thank you for choosing [facility] to provide your oncology and hematology care.   If you have a lab appointment with the Cancer Center, please go directly to the Cancer Center and check in at the registration area.   Wear comfortable clothing and clothing appropriate for easy access to any Portacath or PICC line.   We strive to give you quality time with your provider. You may need to reschedule your appointment if you arrive late (15 or more minutes).  Arriving late affects you and other patients whose appointments are after yours.  Also, if you miss three or more appointments without notifying the office, you may be dismissed from the clinic at the provider's discretion.      For prescription refill requests, have your pharmacy contact our office and allow 72 hours for refills to be completed.    Today you received the following chemotherapy and/or immunotherapy agents: Kyprolis    To help prevent nausea and vomiting after your treatment, we encourage you to take your nausea medication as directed.  BELOW ARE SYMPTOMS THAT SHOULD BE REPORTED IMMEDIATELY: . *FEVER GREATER THAN 100.4 F (38 C) OR HIGHER . *CHILLS OR SWEATING . *NAUSEA AND VOMITING THAT IS NOT CONTROLLED WITH YOUR NAUSEA MEDICATION . *UNUSUAL SHORTNESS OF BREATH . *UNUSUAL BRUISING OR BLEEDING . *URINARY PROBLEMS (pain or burning when urinating, or frequent urination) . *BOWEL PROBLEMS (unusual diarrhea, constipation, pain near the anus) . TENDERNESS IN MOUTH AND THROAT WITH OR WITHOUT PRESENCE OF ULCERS (sore throat, sores in mouth, or a toothache) . UNUSUAL RASH, SWELLING OR PAIN  . UNUSUAL VAGINAL DISCHARGE OR ITCHING   Items with * indicate a potential emergency and should be followed up as soon as possible or go to the Emergency Department if any problems should occur.  Please show the CHEMOTHERAPY ALERT CARD or IMMUNOTHERAPY ALERT  CARD at check-in to the Emergency Department and triage nurse.  Should you have questions after your visit or need to cancel or reschedule your appointment, please contact [facility]  Dept: [phone]  and follow the prompts.  Office hours are 8:00 a.m. to 4:30 p.m. Monday - Friday. Please note that voicemails left after 4:00 p.m. may not be returned until the following business day.  We are closed weekends and major holidays. You have access to a nurse at all times for urgent questions. Please call the main number to the clinic Dept: [phone] and follow the prompts.   For any non-urgent questions, you may also contact your provider using MyChart. We now offer e-Visits for anyone 18 and older to request care online for non-urgent symptoms. For details visit [email or web address].   Also download the MyChart app! Go to the app store, search "MyChart", open the app, select [facility], and log in with your MyChart username and password.  Due to Covid, a mask is required upon entering the hospital/clinic. If you do not have a mask, one will be given to you upon arrival. For doctor visits, patients may have 1 support person aged 18 or older with them. For treatment visits, patients cannot have anyone with them due to current Covid guidelines and our immunocompromised population.   

## 2021-06-01 NOTE — Progress Notes (Signed)
Hematology and Oncology Follow Up Visit  Travis Nguyen [id number] 1950/09/29 70 y.o. 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  He was initially diagnosed in 2014.   Secondary diagnosis: Stage a T1c, Gleason score 3+4 = 7 prostate cancer that is currently in remission.  Prior Therapy: He was treated initially with Cytoxan, Velcade and Decadron and subsequently his regimen changed to a Velcade, Revlimid with dexamethasone.  He achieved remission at that time.   He is status post a robotic-assisted laparoscopic radical prostatectomy and bilateral lymph node dissection on 02/09/2014. The final pathology showed prostate adenocarcinoma Gleason score 4+3 equals 7 involving both lobes.    Current therapy: Carfilzomib, and dexamethasone, daratumumab started on July 28, 2020.  Dexamethasone and carfilzomib is weekly with daratumumab every 2 weeks.   Interim History:  [name] presents today for a follow-up visit.  Since her last visit, he reports no major changes in his health.  He denies any recent hospitalizations or illnesses.  He denies any side effects related to this current treatment.  He denies any nausea, vomiting or abdominal pain.  He denies any infusion related complications.  He denies any worsening neuropathy or abdominal pain.  His performance status quality of life remained excellent.     Medications: Updated on review. Current Outpatient Medications  Medication Sig Dispense Refill   acyclovir (ZOVIRAX) 400 MG tablet TAKE 1 TABLET BY MOUTH DAILY. (Patient not taking: No sig reported) 90 tablet 3   amLODipine (NORVASC) 10 MG tablet Take 1 tablet by mouth once a day 90 tablet 1   amLODipine (NORVASC) 5 MG tablet Take 1 tablet by mouth once a day 90 tablet 1   calcium carbonate (TUMS - DOSED IN MG ELEMENTAL CALCIUM) 500 MG chewable tablet Chew 1 tablet by  mouth daily as needed for indigestion or heartburn.     escitalopram (LEXAPRO) 10 MG tablet Take 1 tablet by mouth once a day 90 tablet 1   lidocaine-prilocaine (EMLA) cream APPLY TOPICALLY TO PORT-A-CATH DAILY AS NEEDED 30 g 2   losartan (COZAAR) 100 MG tablet TAKE 1 TABLET BY MOUTH ONCE A DAY (Patient taking differently: Take 100 mg by mouth daily.) 90 tablet 0   losartan (COZAAR) 100 MG tablet Take 1 tablet by mouth once a day 90 tablet 1   oxyCODONE-acetaminophen (PERCOCET) 10-325 MG tablet Take 1 tablet by mouth 2 times a day (Patient taking differently: Take 1 tablet by mouth 2 (two) times daily as needed for pain.) 60 tablet 0   oxyCODONE-acetaminophen (PERCOCET) 10-325 MG tablet Take 1 tablet by mouth twice a day as needed for 30 days (fill 05/24/21) 60 tablet 0   pantoprazole (PROTONIX) 20 MG tablet Take 1 tablet (20 mg total) by mouth daily. 14 tablet 0   vitamin C (ASCORBIC ACID) 500 MG tablet Take 500 mg by mouth daily.     No current facility-administered medications for this visit.     Allergies:  Allergies  Allergen Reactions   Aspirin     Pt stated "raises BP"        Physical Exam:      Blood pressure (!) 150/83, pulse 74, temperature 97.7 F (36.5 C), temperature source Tympanic, resp. rate 17, weight 163 lb 3 oz (74 kg), SpO2 100 %.     ECOG: 1    General appearance: Alert, awake without any distress. Head: Atraumatic without abnormalities Oropharynx:  Without any thrush or ulcers. Eyes: No scleral icterus. Lymph nodes: No lymphadenopathy noted in the cervical, supraclavicular, or axillary nodes Heart:regular rate and rhythm, without any murmurs or gallops.   Lung: Clear to auscultation without any rhonchi, wheezes or dullness to percussion. Abdomin: Soft, nontender without any shifting dullness or ascites. Musculoskeletal: No clubbing or cyanosis. Neurological: No motor or sensory deficits. Skin: No rashes or  lesions.                Lab Results: Lab Results  Component Value Date   WBC 4.2 05/25/2021   HGB 10.8 (L) 05/25/2021   HCT 32.2 (L) 05/25/2021   MCV 101.9 (H) 05/25/2021   PLT 166 05/25/2021     Chemistry      Component Value Date/Time   NA 138 05/25/2021 0832   NA 138 03/27/2017 0838   K 4.3 05/25/2021 0832   K 4.3 03/27/2017 0838   CL 105 05/25/2021 0832   CL 105 01/01/2013 0835   CO2 24 05/25/2021 0832   CO2 24 03/27/2017 0838   BUN 11 05/25/2021 0832   BUN 23.7 03/27/2017 0838   CREATININE 0.80 05/25/2021 0832   CREATININE 1.2 03/27/2017 0838      Component Value Date/Time   CALCIUM 8.9 05/25/2021 0832   CALCIUM 9.9 03/27/2017 0838   ALKPHOS 73 05/25/2021 0832   ALKPHOS 79 03/27/2017 0838   AST 16 05/25/2021 0832   AST 25 03/27/2017 0838   ALT 24 05/25/2021 0832   ALT 26 03/27/2017 0838   BILITOT 0.5 05/25/2021 0832   BILITOT 0.31 03/27/2017 0838       Latest Reference Range & Units 01/05/21 10:14 02/16/21 11:27 04/13/21 08:44 05/25/21 08:21  M Protein SerPl Elph-Mcnc Not Observed g/dL 0.9 (H) (C) 0.6 (H) (C) 0.6 (H) (C) Not Observed (C)  IFE 1  Comment ! (C) Comment ! (C) Comment ! (C) Comment (C)  Globulin, Total 2.2 - 3.9 g/dL 2.7 (C) 2.8 (C) 2.3 (C) 2.6 (C)  B-Globulin SerPl Elph-Mcnc 0.7 - 1.3 g/dL 0.8 (C) 0.9 (C) 0.7 (C) 0.9 (C)  IgG (Immunoglobin G), Serum 603 - 1,613 mg/dL 1,129 1,002 933 872  IgM (Immunoglobulin M), Srm 20 - 172 mg/dL 16 (L) 16 (L) 10 (L) 54  IgA 61 - 437 mg/dL 21 (L) 20 (L) 17 (L) 68     Impression and Plan:  69 year old man with:   1.  Relapsed multiple myeloma noted in 2022.  He initially presented with IgG kappa subtype.  He has tolerated the current therapy very well without any major complications.  Protein studies obtained on May 25, 2021 showed excellent response with M spike is normal. He has normalization of his quantitative immunoglobulins.  Risks and benefits of continuing this treatment were  discussed at this time agreeable to continue.  The plan is to continue with triple therapy and consider daratumumab maintenance only in January 2023.   2.  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  His hemoglobin from today remains stable and close to 11.   3.  IV access: Port-A-Cath remains in use without any issues.   4.  Antiemetics: No nausea or vomiting reported.  Compazine available.   5.  VZV prophylaxis: He continues to be on acyclovir 100 reactivation.  6. Followup: He will continue to follow weekly treatments and MD follow-up in 4 weeks   30  minutes were dedicated to this visit.  Time  spent on reviewing laboratory data, disease status update, treatment choices and future plan of care discussed.    Zola Button, MD 11/18/20227:55 AM

## 2021-06-04 ENCOUNTER — Other Ambulatory Visit (HOSPITAL_COMMUNITY): Payer: Self-pay

## 2021-06-05 ENCOUNTER — Other Ambulatory Visit (HOSPITAL_COMMUNITY): Payer: Self-pay

## 2021-06-08 ENCOUNTER — Inpatient Hospital Stay: Payer: Medicare Other

## 2021-06-08 ENCOUNTER — Other Ambulatory Visit: Payer: Self-pay

## 2021-06-08 VITALS — BP 136/74 | HR 72 | Temp 98.5°F | Resp 17 | Wt 165.8 lb

## 2021-06-08 DIAGNOSIS — C9001 Multiple myeloma in remission: Secondary | ICD-10-CM

## 2021-06-08 DIAGNOSIS — Z95828 Presence of other vascular implants and grafts: Secondary | ICD-10-CM

## 2021-06-08 DIAGNOSIS — Z5112 Encounter for antineoplastic immunotherapy: Secondary | ICD-10-CM | POA: Diagnosis not present

## 2021-06-08 DIAGNOSIS — Z79899 Other long term (current) drug therapy: Secondary | ICD-10-CM | POA: Diagnosis not present

## 2021-06-08 DIAGNOSIS — D649 Anemia, unspecified: Secondary | ICD-10-CM | POA: Diagnosis not present

## 2021-06-08 DIAGNOSIS — Z886 Allergy status to analgesic agent status: Secondary | ICD-10-CM | POA: Diagnosis not present

## 2021-06-08 DIAGNOSIS — C61 Malignant neoplasm of prostate: Secondary | ICD-10-CM

## 2021-06-08 DIAGNOSIS — C9002 Multiple myeloma in relapse: Secondary | ICD-10-CM | POA: Diagnosis not present

## 2021-06-08 LAB — CBC WITH DIFFERENTIAL (CANCER CENTER ONLY)
Abs Immature Granulocytes: 0.02 10*3/uL (ref 0.00–0.07)
Basophils Absolute: 0 10*3/uL (ref 0.0–0.1)
Basophils Relative: 0 %
Eosinophils Absolute: 0 10*3/uL (ref 0.0–0.5)
Eosinophils Relative: 1 %
HCT: 31.7 % — ABNORMAL LOW (ref 39.0–52.0)
Hemoglobin: 11 g/dL — ABNORMAL LOW (ref 13.0–17.0)
Immature Granulocytes: 0 %
Lymphocytes Relative: 28 %
Lymphs Abs: 1.6 10*3/uL (ref 0.7–4.0)
MCH: 34.8 pg — ABNORMAL HIGH (ref 26.0–34.0)
MCHC: 34.7 g/dL (ref 30.0–36.0)
MCV: 100.3 fL — ABNORMAL HIGH (ref 80.0–100.0)
Monocytes Absolute: 0.6 10*3/uL (ref 0.1–1.0)
Monocytes Relative: 10 %
Neutro Abs: 3.6 10*3/uL (ref 1.7–7.7)
Neutrophils Relative %: 61 %
Platelet Count: 171 10*3/uL (ref 150–400)
RBC: 3.16 MIL/uL — ABNORMAL LOW (ref 4.22–5.81)
RDW: 11.9 % (ref 11.5–15.5)
WBC Count: 5.9 10*3/uL (ref 4.0–10.5)
nRBC: 0 % (ref 0.0–0.2)

## 2021-06-08 LAB — CMP (CANCER CENTER ONLY)
ALT: 100 U/L — ABNORMAL HIGH (ref 0–44)
AST: 35 U/L (ref 15–41)
Albumin: 4 g/dL (ref 3.5–5.0)
Alkaline Phosphatase: 102 U/L (ref 38–126)
Anion gap: 8 (ref 5–15)
BUN: 20 mg/dL (ref 8–23)
CO2: 22 mmol/L (ref 22–32)
Calcium: 8.8 mg/dL — ABNORMAL LOW (ref 8.9–10.3)
Chloride: 106 mmol/L (ref 98–111)
Creatinine: 0.94 mg/dL (ref 0.61–1.24)
GFR, Estimated: >60 mL/min (ref >60)
Glucose, Bld: 96 mg/dL (ref 70–99)
Potassium: 4.2 mmol/L (ref 3.5–5.1)
Sodium: 136 mmol/L (ref 135–145)
Total Bilirubin: 0.5 mg/dL (ref 0.3–1.2)
Total Protein: 7 g/dL (ref 6.5–8.1)

## 2021-06-08 MED ORDER — DIPHENHYDRAMINE HCL 25 MG PO CAPS
50.0000 mg | ORAL_CAPSULE | Freq: Once | ORAL | Status: AC
  Administered 2021-06-08: 50 mg via ORAL
  Filled 2021-06-08: qty 2

## 2021-06-08 MED ORDER — DEXTROSE 5 % IV SOLN
56.0000 mg/m2 | Freq: Once | INTRAVENOUS | Status: AC
  Administered 2021-06-08: 110 mg via INTRAVENOUS
  Filled 2021-06-08: qty 30

## 2021-06-08 MED ORDER — SODIUM CHLORIDE 0.9 % IV SOLN: Freq: Once | INTRAVENOUS | Status: AC

## 2021-06-08 MED ORDER — SODIUM CHLORIDE 0.9 % IV SOLN
40.0000 mg | Freq: Once | INTRAVENOUS | Status: AC
  Administered 2021-06-08: 40 mg via INTRAVENOUS
  Filled 2021-06-08: qty 4

## 2021-06-08 MED ORDER — ACETAMINOPHEN 325 MG PO TABS
650.0000 mg | ORAL_TABLET | Freq: Once | ORAL | Status: AC
  Administered 2021-06-08: 650 mg via ORAL
  Filled 2021-06-08: qty 2

## 2021-06-08 MED ORDER — SODIUM CHLORIDE 0.9% FLUSH
10.0000 mL | INTRAVENOUS | Status: DC | PRN
  Administered 2021-06-08: 10 mL

## 2021-06-08 MED ORDER — DARATUMUMAB-HYALURONIDASE-FIHJ 1800-30000 MG-UT/15ML ~~LOC~~ SOLN
1800.0000 mg | Freq: Once | SUBCUTANEOUS | Status: AC
  Administered 2021-06-08: 1800 mg via SUBCUTANEOUS
  Filled 2021-06-08: qty 15

## 2021-06-08 MED ORDER — SODIUM CHLORIDE 0.9 % IV SOLN
Freq: Once | INTRAVENOUS | Status: AC
Start: 2021-06-08 — End: 2021-06-08

## 2021-06-08 MED ORDER — HEPARIN SOD (PORK) LOCK FLUSH 100 UNIT/ML IV SOLN
500.0000 [IU] | Freq: Once | INTRAVENOUS | Status: AC | PRN
  Administered 2021-06-08: 500 [IU]

## 2021-06-08 NOTE — Progress Notes (Signed)
OK to trt w/ elevated ALT of 100 per Dr. Lorenso Courier

## 2021-06-08 NOTE — Patient Instructions (Signed)
[facility]  Discharge Instructions: Thank you for choosing [facility] to provide your oncology and hematology care.   If you have a lab appointment with the [facility], please go directly to the [facility] and check in at the registration area.   Wear comfortable clothing and clothing appropriate for easy access to any Portacath or PICC line.   We strive to give you quality time with your provider. You may need to reschedule your appointment if you arrive late (15 or more minutes).  Arriving late affects you and other patients whose appointments are after yours.  Also, if you miss three or more appointments without notifying the office, you may be dismissed from the clinic at the provider's discretion.      For prescription refill requests, have your pharmacy contact our office and allow 72 hours for refills to be completed.    Today you received the following chemotherapy and/or immunotherapy agents: Kyprolis & Darzalex Faspro   To help prevent nausea and vomiting after your treatment, we encourage you to take your nausea medication as directed.  BELOW ARE SYMPTOMS THAT SHOULD BE REPORTED IMMEDIATELY: *FEVER GREATER THAN 100.4 F (38 C) OR HIGHER *CHILLS OR SWEATING *NAUSEA AND VOMITING THAT IS NOT CONTROLLED WITH YOUR NAUSEA MEDICATION *UNUSUAL SHORTNESS OF BREATH *UNUSUAL BRUISING OR BLEEDING *URINARY PROBLEMS (pain or burning when urinating, or frequent urination) *BOWEL PROBLEMS (unusual diarrhea, constipation, pain near the anus) TENDERNESS IN MOUTH AND THROAT WITH OR WITHOUT PRESENCE OF ULCERS (sore throat, sores in mouth, or a toothache) UNUSUAL RASH, SWELLING OR PAIN  UNUSUAL VAGINAL DISCHARGE OR ITCHING   Items with * indicate a potential emergency and should be followed up as soon as possible or go to the Emergency Department if any problems should occur.  Please show the CHEMOTHERAPY ALERT CARD or IMMUNOTHERAPY ALERT CARD  at check-in to the Emergency Department and triage nurse.  Should you have questions after your visit or need to cancel or reschedule your appointment, please contact [facility]  Dept: [phone]  and follow the prompts.  Office hours are 8:00 a.m. to 4:30 p.m. Monday - Friday. Please note that voicemails left after 4:00 p.m. may not be returned until the following business day.  We are closed weekends and major holidays. You have access to a nurse at all times for urgent questions. Please call the main number to the clinic Dept: [phone] and follow the prompts.   For any non-urgent questions, you may also contact your provider using MyChart. We now offer e-Visits for anyone 60 and older to request care online for non-urgent symptoms. For details visit [email or web address].   Also download the MyChart app! Go to the app store, search "MyChart", open the app, select [facility], and log in with your MyChart username and password.  Due to Covid, a mask is required upon entering the hospital/clinic. If you do not have a mask, one will be given to you upon arrival. For doctor visits, patients may have 1 support person aged 87 or older with them. For treatment visits, patients cannot have anyone with them due to current Covid guidelines and our immunocompromised population.

## 2021-06-15 ENCOUNTER — Inpatient Hospital Stay: Payer: Medicare Other

## 2021-06-15 ENCOUNTER — Other Ambulatory Visit: Payer: Medicare Other

## 2021-06-15 ENCOUNTER — Inpatient Hospital Stay: Payer: Medicare Other | Attending: Oncology

## 2021-06-15 ENCOUNTER — Other Ambulatory Visit: Payer: Self-pay

## 2021-06-15 ENCOUNTER — Ambulatory Visit: Payer: Medicare Other

## 2021-06-15 VITALS — BP 114/75 | HR 74 | Temp 98.4°F | Resp 18

## 2021-06-15 DIAGNOSIS — Z95828 Presence of other vascular implants and grafts: Secondary | ICD-10-CM

## 2021-06-15 DIAGNOSIS — C61 Malignant neoplasm of prostate: Secondary | ICD-10-CM

## 2021-06-15 DIAGNOSIS — Z5112 Encounter for antineoplastic immunotherapy: Secondary | ICD-10-CM | POA: Diagnosis not present

## 2021-06-15 DIAGNOSIS — C9001 Multiple myeloma in remission: Secondary | ICD-10-CM | POA: Diagnosis not present

## 2021-06-15 LAB — CBC WITH DIFFERENTIAL (CANCER CENTER ONLY)
Abs Immature Granulocytes: 0.01 10*3/uL (ref 0.00–0.07)
Basophils Absolute: 0 10*3/uL (ref 0.0–0.1)
Basophils Relative: 0 %
Eosinophils Absolute: 0 10*3/uL (ref 0.0–0.5)
Eosinophils Relative: 1 %
HCT: 32.7 % — ABNORMAL LOW (ref 39.0–52.0)
Hemoglobin: 11 g/dL — ABNORMAL LOW (ref 13.0–17.0)
Immature Granulocytes: 0 %
Lymphocytes Relative: 23 %
Lymphs Abs: 1.3 10*3/uL (ref 0.7–4.0)
MCH: 34.3 pg — ABNORMAL HIGH (ref 26.0–34.0)
MCHC: 33.6 g/dL (ref 30.0–36.0)
MCV: 101.9 fL — ABNORMAL HIGH (ref 80.0–100.0)
Monocytes Absolute: 0.5 10*3/uL (ref 0.1–1.0)
Monocytes Relative: 9 %
Neutro Abs: 3.7 10*3/uL (ref 1.7–7.7)
Neutrophils Relative %: 67 %
Platelet Count: 171 10*3/uL (ref 150–400)
RBC: 3.21 MIL/uL — ABNORMAL LOW (ref 4.22–5.81)
RDW: 12 % (ref 11.5–15.5)
WBC Count: 5.6 10*3/uL (ref 4.0–10.5)
nRBC: 0 % (ref 0.0–0.2)

## 2021-06-15 LAB — CMP (CANCER CENTER ONLY)
ALT: 29 U/L (ref 0–44)
AST: 17 U/L (ref 15–41)
Albumin: 4.1 g/dL (ref 3.5–5.0)
Alkaline Phosphatase: 84 U/L (ref 38–126)
Anion gap: 8 (ref 5–15)
BUN: 16 mg/dL (ref 8–23)
CO2: 23 mmol/L (ref 22–32)
Calcium: 8.6 mg/dL — ABNORMAL LOW (ref 8.9–10.3)
Chloride: 105 mmol/L (ref 98–111)
Creatinine: 0.91 mg/dL (ref 0.61–1.24)
GFR, Estimated: >60 mL/min (ref >60)
Glucose, Bld: 104 mg/dL — ABNORMAL HIGH (ref 70–99)
Potassium: 3.9 mmol/L (ref 3.5–5.1)
Sodium: 136 mmol/L (ref 135–145)
Total Bilirubin: 0.5 mg/dL (ref 0.3–1.2)
Total Protein: 7 g/dL (ref 6.5–8.1)

## 2021-06-15 MED ORDER — HEPARIN SOD (PORK) LOCK FLUSH 100 UNIT/ML IV SOLN
500.0000 [IU] | Freq: Once | INTRAVENOUS | Status: AC | PRN
  Administered 2021-06-15: 500 [IU]

## 2021-06-15 MED ORDER — DEXTROSE 5 % IV SOLN
56.0000 mg/m2 | Freq: Once | INTRAVENOUS | Status: AC
  Administered 2021-06-15: 110 mg via INTRAVENOUS
  Filled 2021-06-15: qty 10

## 2021-06-15 MED ORDER — SODIUM CHLORIDE 0.9% FLUSH
10.0000 mL | INTRAVENOUS | Status: DC | PRN
  Administered 2021-06-15: 10 mL

## 2021-06-15 MED ORDER — SODIUM CHLORIDE 0.9 % IV SOLN
40.0000 mg | Freq: Once | INTRAVENOUS | Status: AC
  Administered 2021-06-15: 40 mg via INTRAVENOUS
  Filled 2021-06-15: qty 4

## 2021-06-15 MED ORDER — SODIUM CHLORIDE 0.9 % IV SOLN: Freq: Once | INTRAVENOUS | Status: AC

## 2021-06-15 NOTE — Patient Instructions (Signed)
[facility]  Discharge Instructions: Thank you for choosing [facility] to provide your oncology and hematology care.   If you have a lab appointment with the Cancer Center, please go directly to the Cancer Center and check in at the registration area.   Wear comfortable clothing and clothing appropriate for easy access to any Portacath or PICC line.   We strive to give you quality time with your provider. You may need to reschedule your appointment if you arrive late (15 or more minutes).  Arriving late affects you and other patients whose appointments are after yours.  Also, if you miss three or more appointments without notifying the office, you may be dismissed from the clinic at the provider's discretion.      For prescription refill requests, have your pharmacy contact our office and allow 72 hours for refills to be completed.    Today you received the following chemotherapy and/or immunotherapy agents: Kyprolis    To help prevent nausea and vomiting after your treatment, we encourage you to take your nausea medication as directed.  BELOW ARE SYMPTOMS THAT SHOULD BE REPORTED IMMEDIATELY: . *FEVER GREATER THAN 100.4 F (38 C) OR HIGHER . *CHILLS OR SWEATING . *NAUSEA AND VOMITING THAT IS NOT CONTROLLED WITH YOUR NAUSEA MEDICATION . *UNUSUAL SHORTNESS OF BREATH . *UNUSUAL BRUISING OR BLEEDING . *URINARY PROBLEMS (pain or burning when urinating, or frequent urination) . *BOWEL PROBLEMS (unusual diarrhea, constipation, pain near the anus) . TENDERNESS IN MOUTH AND THROAT WITH OR WITHOUT PRESENCE OF ULCERS (sore throat, sores in mouth, or a toothache) . UNUSUAL RASH, SWELLING OR PAIN  . UNUSUAL VAGINAL DISCHARGE OR ITCHING   Items with * indicate a potential emergency and should be followed up as soon as possible or go to the Emergency Department if any problems should occur.  Please show the CHEMOTHERAPY ALERT CARD or IMMUNOTHERAPY ALERT  CARD at check-in to the Emergency Department and triage nurse.  Should you have questions after your visit or need to cancel or reschedule your appointment, please contact [facility]  Dept: [phone]  and follow the prompts.  Office hours are 8:00 a.m. to 4:30 p.m. Monday - Friday. Please note that voicemails left after 4:00 p.m. may not be returned until the following business day.  We are closed weekends and major holidays. You have access to a nurse at all times for urgent questions. Please call the main number to the clinic Dept: [phone] and follow the prompts.   For any non-urgent questions, you may also contact your provider using MyChart. We now offer e-Visits for anyone 18 and older to request care online for non-urgent symptoms. For details visit [email or web address].   Also download the MyChart app! Go to the app store, search "MyChart", open the app, select , and log in with your MyChart username and password.  Due to Covid, a mask is required upon entering the hospital/clinic. If you do not have a mask, one will be given to you upon arrival. For doctor visits, patients may have 1 support person aged 18 or older with them. For treatment visits, patients cannot have anyone with them due to current Covid guidelines and our immunocompromised population.   

## 2021-06-22 ENCOUNTER — Inpatient Hospital Stay: Payer: Medicare Other

## 2021-06-22 ENCOUNTER — Other Ambulatory Visit: Payer: Self-pay

## 2021-06-22 VITALS — BP 142/87 | HR 66 | Temp 98.7°F | Resp 18 | Wt 164.5 lb

## 2021-06-22 DIAGNOSIS — Z95828 Presence of other vascular implants and grafts: Secondary | ICD-10-CM

## 2021-06-22 DIAGNOSIS — Z5112 Encounter for antineoplastic immunotherapy: Secondary | ICD-10-CM | POA: Diagnosis not present

## 2021-06-22 DIAGNOSIS — C9001 Multiple myeloma in remission: Secondary | ICD-10-CM

## 2021-06-22 DIAGNOSIS — C61 Malignant neoplasm of prostate: Secondary | ICD-10-CM

## 2021-06-22 LAB — CMP (CANCER CENTER ONLY)
ALT: 23 U/L (ref 0–44)
AST: 19 U/L (ref 15–41)
Albumin: 4 g/dL (ref 3.5–5.0)
Alkaline Phosphatase: 76 U/L (ref 38–126)
Anion gap: 7 (ref 5–15)
BUN: 16 mg/dL (ref 8–23)
CO2: 23 mmol/L (ref 22–32)
Calcium: 8.8 mg/dL — ABNORMAL LOW (ref 8.9–10.3)
Chloride: 106 mmol/L (ref 98–111)
Creatinine: 0.86 mg/dL (ref 0.61–1.24)
GFR, Estimated: >60 mL/min (ref >60)
Glucose, Bld: 82 mg/dL (ref 70–99)
Potassium: 4.7 mmol/L (ref 3.5–5.1)
Sodium: 136 mmol/L (ref 135–145)
Total Bilirubin: 0.5 mg/dL (ref 0.3–1.2)
Total Protein: 6.7 g/dL (ref 6.5–8.1)

## 2021-06-22 LAB — CBC WITH DIFFERENTIAL (CANCER CENTER ONLY)
Abs Immature Granulocytes: 0.02 10*3/uL (ref 0.00–0.07)
Basophils Absolute: 0 10*3/uL (ref 0.0–0.1)
Basophils Relative: 0 %
Eosinophils Absolute: 0.1 10*3/uL (ref 0.0–0.5)
Eosinophils Relative: 1 %
HCT: 32 % — ABNORMAL LOW (ref 39.0–52.0)
Hemoglobin: 11.1 g/dL — ABNORMAL LOW (ref 13.0–17.0)
Immature Granulocytes: 0 %
Lymphocytes Relative: 25 %
Lymphs Abs: 1.2 10*3/uL (ref 0.7–4.0)
MCH: 35 pg — ABNORMAL HIGH (ref 26.0–34.0)
MCHC: 34.7 g/dL (ref 30.0–36.0)
MCV: 100.9 fL — ABNORMAL HIGH (ref 80.0–100.0)
Monocytes Absolute: 0.5 10*3/uL (ref 0.1–1.0)
Monocytes Relative: 11 %
Neutro Abs: 3 10*3/uL (ref 1.7–7.7)
Neutrophils Relative %: 63 %
Platelet Count: 164 10*3/uL (ref 150–400)
RBC: 3.17 MIL/uL — ABNORMAL LOW (ref 4.22–5.81)
RDW: 11.9 % (ref 11.5–15.5)
WBC Count: 4.8 10*3/uL (ref 4.0–10.5)
nRBC: 0 % (ref 0.0–0.2)

## 2021-06-22 MED ORDER — HEPARIN SOD (PORK) LOCK FLUSH 100 UNIT/ML IV SOLN
500.0000 [IU] | Freq: Once | INTRAVENOUS | Status: AC | PRN
  Administered 2021-06-22: 500 [IU]

## 2021-06-22 MED ORDER — SODIUM CHLORIDE 0.9 % IV SOLN
40.0000 mg | Freq: Once | INTRAVENOUS | Status: AC
  Administered 2021-06-22: 40 mg via INTRAVENOUS
  Filled 2021-06-22: qty 4

## 2021-06-22 MED ORDER — SODIUM CHLORIDE 0.9% FLUSH
10.0000 mL | INTRAVENOUS | Status: DC | PRN
  Administered 2021-06-22: 10 mL

## 2021-06-22 MED ORDER — ACETAMINOPHEN 325 MG PO TABS
650.0000 mg | ORAL_TABLET | Freq: Once | ORAL | Status: AC
  Administered 2021-06-22: 650 mg via ORAL
  Filled 2021-06-22: qty 2

## 2021-06-22 MED ORDER — DIPHENHYDRAMINE HCL 25 MG PO CAPS
50.0000 mg | ORAL_CAPSULE | Freq: Once | ORAL | Status: AC
  Administered 2021-06-22: 50 mg via ORAL
  Filled 2021-06-22: qty 2

## 2021-06-22 MED ORDER — SODIUM CHLORIDE 0.9 % IV SOLN: Freq: Once | INTRAVENOUS | Status: AC

## 2021-06-22 MED ORDER — DARATUMUMAB-HYALURONIDASE-FIHJ 1800-30000 MG-UT/15ML ~~LOC~~ SOLN
1800.0000 mg | Freq: Once | SUBCUTANEOUS | Status: AC
  Administered 2021-06-22: 1800 mg via SUBCUTANEOUS
  Filled 2021-06-22: qty 15

## 2021-06-22 MED ORDER — DEXTROSE 5 % IV SOLN
56.0000 mg/m2 | Freq: Once | INTRAVENOUS | Status: AC
  Administered 2021-06-22: 110 mg via INTRAVENOUS
  Filled 2021-06-22: qty 10

## 2021-06-22 NOTE — Patient Instructions (Signed)
[facility]  Discharge Instructions: Thank you for choosing [facility] to provide your oncology and hematology care.   If you have a lab appointment with the [facility], please go directly to the [facility] and check in at the registration area.   Wear comfortable clothing and clothing appropriate for easy access to any Portacath or PICC line.   We strive to give you quality time with your provider. You may need to reschedule your appointment if you arrive late (15 or more minutes).  Arriving late affects you and other patients whose appointments are after yours.  Also, if you miss three or more appointments without notifying the office, you may be dismissed from the clinic at the provider's discretion.      For prescription refill requests, have your pharmacy contact our office and allow 72 hours for refills to be completed.    Today you received the following chemotherapy and/or immunotherapy agents: Kyprolis & Darzalex Faspro      To help prevent nausea and vomiting after your treatment, we encourage you to take your nausea medication as directed.  BELOW ARE SYMPTOMS THAT SHOULD BE REPORTED IMMEDIATELY: *FEVER GREATER THAN 100.4 F (38 C) OR HIGHER *CHILLS OR SWEATING *NAUSEA AND VOMITING THAT IS NOT CONTROLLED WITH YOUR NAUSEA MEDICATION *UNUSUAL SHORTNESS OF BREATH *UNUSUAL BRUISING OR BLEEDING *URINARY PROBLEMS (pain or burning when urinating, or frequent urination) *BOWEL PROBLEMS (unusual diarrhea, constipation, pain near the anus) TENDERNESS IN MOUTH AND THROAT WITH OR WITHOUT PRESENCE OF ULCERS (sore throat, sores in mouth, or a toothache) UNUSUAL RASH, SWELLING OR PAIN  UNUSUAL VAGINAL DISCHARGE OR ITCHING   Items with * indicate a potential emergency and should be followed up as soon as possible or go to the Emergency Department if any problems should occur.  Please show the CHEMOTHERAPY ALERT CARD or IMMUNOTHERAPY ALERT  CARD at check-in to the Emergency Department and triage nurse.  Should you have questions after your visit or need to cancel or reschedule your appointment, please contact [facility]  Dept: [phone]  and follow the prompts.  Office hours are 8:00 a.m. to 4:30 p.m. Monday - Friday. Please note that voicemails left after 4:00 p.m. may not be returned until the following business day.  We are closed weekends and major holidays. You have access to a nurse at all times for urgent questions. Please call the main number to the clinic Dept: [phone] and follow the prompts.   For any non-urgent questions, you may also contact your provider using MyChart. We now offer e-Visits for anyone 56 and older to request care online for non-urgent symptoms. For details visit [email or web address].   Also download the MyChart app! Go to the app store, search "MyChart", open the app, select [facility], and log in with your MyChart username and password.  Due to Covid, a mask is required upon entering the hospital/clinic. If you do not have a mask, one will be given to you upon arrival. For doctor visits, patients may have 1 support person aged 78 or older with them. For treatment visits, patients cannot have anyone with them due to current Covid guidelines and our immunocompromised population.

## 2021-06-26 ENCOUNTER — Other Ambulatory Visit (HOSPITAL_COMMUNITY): Payer: Self-pay

## 2021-06-27 ENCOUNTER — Other Ambulatory Visit (HOSPITAL_COMMUNITY): Payer: Self-pay

## 2021-06-27 MED ORDER — AMLODIPINE BESYLATE 5 MG PO TABS
5.0000 mg | ORAL_TABLET | Freq: Every day | ORAL | 0 refills | Status: DC
  Filled 2021-06-27: qty 90, 90d supply, fill #0

## 2021-06-29 ENCOUNTER — Inpatient Hospital Stay: Payer: Medicare Other

## 2021-06-29 ENCOUNTER — Other Ambulatory Visit: Payer: Self-pay

## 2021-06-29 ENCOUNTER — Other Ambulatory Visit (HOSPITAL_COMMUNITY): Payer: Self-pay

## 2021-06-29 VITALS — BP 163/87 | HR 67 | Temp 98.6°F | Resp 17 | Wt 162.8 lb

## 2021-06-29 DIAGNOSIS — C9001 Multiple myeloma in remission: Secondary | ICD-10-CM

## 2021-06-29 DIAGNOSIS — Z95828 Presence of other vascular implants and grafts: Secondary | ICD-10-CM

## 2021-06-29 DIAGNOSIS — C9000 Multiple myeloma not having achieved remission: Secondary | ICD-10-CM

## 2021-06-29 DIAGNOSIS — C61 Malignant neoplasm of prostate: Secondary | ICD-10-CM

## 2021-06-29 DIAGNOSIS — Z5112 Encounter for antineoplastic immunotherapy: Secondary | ICD-10-CM | POA: Diagnosis not present

## 2021-06-29 LAB — CBC WITH DIFFERENTIAL (CANCER CENTER ONLY)
Abs Immature Granulocytes: 0.03 10*3/uL (ref 0.00–0.07)
Basophils Absolute: 0 10*3/uL (ref 0.0–0.1)
Basophils Relative: 0 %
Eosinophils Absolute: 0.1 10*3/uL (ref 0.0–0.5)
Eosinophils Relative: 2 %
HCT: 33 % — ABNORMAL LOW (ref 39.0–52.0)
Hemoglobin: 11.3 g/dL — ABNORMAL LOW (ref 13.0–17.0)
Immature Granulocytes: 1 %
Lymphocytes Relative: 27 %
Lymphs Abs: 1.3 10*3/uL (ref 0.7–4.0)
MCH: 34.5 pg — ABNORMAL HIGH (ref 26.0–34.0)
MCHC: 34.2 g/dL (ref 30.0–36.0)
MCV: 100.6 fL — ABNORMAL HIGH (ref 80.0–100.0)
Monocytes Absolute: 0.5 10*3/uL (ref 0.1–1.0)
Monocytes Relative: 10 %
Neutro Abs: 3 10*3/uL (ref 1.7–7.7)
Neutrophils Relative %: 60 %
Platelet Count: 166 10*3/uL (ref 150–400)
RBC: 3.28 MIL/uL — ABNORMAL LOW (ref 4.22–5.81)
RDW: 12 % (ref 11.5–15.5)
WBC Count: 4.8 10*3/uL (ref 4.0–10.5)
nRBC: 0 % (ref 0.0–0.2)

## 2021-06-29 LAB — CMP (CANCER CENTER ONLY)
ALT: 21 U/L (ref 0–44)
AST: 19 U/L (ref 15–41)
Albumin: 4.2 g/dL (ref 3.5–5.0)
Alkaline Phosphatase: 74 U/L (ref 38–126)
Anion gap: 9 (ref 5–15)
BUN: 12 mg/dL (ref 8–23)
CO2: 25 mmol/L (ref 22–32)
Calcium: 9 mg/dL (ref 8.9–10.3)
Chloride: 103 mmol/L (ref 98–111)
Creatinine: 0.82 mg/dL (ref 0.61–1.24)
GFR, Estimated: >60 mL/min (ref >60)
Glucose, Bld: 83 mg/dL (ref 70–99)
Potassium: 4.2 mmol/L (ref 3.5–5.1)
Sodium: 137 mmol/L (ref 135–145)
Total Bilirubin: 0.5 mg/dL (ref 0.3–1.2)
Total Protein: 7.1 g/dL (ref 6.5–8.1)

## 2021-06-29 MED ORDER — SODIUM CHLORIDE 0.9 % IV SOLN
Freq: Once | INTRAVENOUS | Status: AC
Start: 2021-06-29 — End: 2021-06-29

## 2021-06-29 MED ORDER — HEPARIN SOD (PORK) LOCK FLUSH 100 UNIT/ML IV SOLN
500.0000 [IU] | Freq: Once | INTRAVENOUS | Status: AC | PRN
  Administered 2021-06-29: 500 [IU]

## 2021-06-29 MED ORDER — SODIUM CHLORIDE 0.9 % IV SOLN
40.0000 mg | Freq: Once | INTRAVENOUS | Status: AC
  Administered 2021-06-29: 40 mg via INTRAVENOUS
  Filled 2021-06-29: qty 4

## 2021-06-29 MED ORDER — SODIUM CHLORIDE 0.9% FLUSH
10.0000 mL | INTRAVENOUS | Status: DC | PRN
  Administered 2021-06-29: 10 mL

## 2021-06-29 MED ORDER — DEXTROSE 5 % IV SOLN
56.0000 mg/m2 | Freq: Once | INTRAVENOUS | Status: AC
  Administered 2021-06-29: 110 mg via INTRAVENOUS
  Filled 2021-06-29: qty 10

## 2021-06-29 MED ORDER — SODIUM CHLORIDE 0.9 % IV SOLN: Freq: Once | INTRAVENOUS | Status: AC

## 2021-06-29 NOTE — Patient Instructions (Signed)
[facility]  Discharge Instructions: Thank you for choosing [facility] to provide your oncology and hematology care.   If you have a lab appointment with the Cancer Center, please go directly to the Cancer Center and check in at the registration area.   Wear comfortable clothing and clothing appropriate for easy access to any Portacath or PICC line.   We strive to give you quality time with your provider. You may need to reschedule your appointment if you arrive late (15 or more minutes).  Arriving late affects you and other patients whose appointments are after yours.  Also, if you miss three or more appointments without notifying the office, you may be dismissed from the clinic at the provider's discretion.      For prescription refill requests, have your pharmacy contact our office and allow 72 hours for refills to be completed.    Today you received the following chemotherapy and/or immunotherapy agents: Kyprolis    To help prevent nausea and vomiting after your treatment, we encourage you to take your nausea medication as directed.  BELOW ARE SYMPTOMS THAT SHOULD BE REPORTED IMMEDIATELY: . *FEVER GREATER THAN 100.4 F (38 C) OR HIGHER . *CHILLS OR SWEATING . *NAUSEA AND VOMITING THAT IS NOT CONTROLLED WITH YOUR NAUSEA MEDICATION . *UNUSUAL SHORTNESS OF BREATH . *UNUSUAL BRUISING OR BLEEDING . *URINARY PROBLEMS (pain or burning when urinating, or frequent urination) . *BOWEL PROBLEMS (unusual diarrhea, constipation, pain near the anus) . TENDERNESS IN MOUTH AND THROAT WITH OR WITHOUT PRESENCE OF ULCERS (sore throat, sores in mouth, or a toothache) . UNUSUAL RASH, SWELLING OR PAIN  . UNUSUAL VAGINAL DISCHARGE OR ITCHING   Items with * indicate a potential emergency and should be followed up as soon as possible or go to the Emergency Department if any problems should occur.  Please show the CHEMOTHERAPY ALERT CARD or IMMUNOTHERAPY ALERT  CARD at check-in to the Emergency Department and triage nurse.  Should you have questions after your visit or need to cancel or reschedule your appointment, please contact [facility]  Dept: [phone]  and follow the prompts.  Office hours are 8:00 a.m. to 4:30 p.m. Monday - Friday. Please note that voicemails left after 4:00 p.m. may not be returned until the following business day.  We are closed weekends and major holidays. You have access to a nurse at all times for urgent questions. Please call the main number to the clinic Dept: [phone] and follow the prompts.   For any non-urgent questions, you may also contact your provider using MyChart. We now offer e-Visits for anyone 18 and older to request care online for non-urgent symptoms. For details visit [email or web address].   Also download the MyChart app! Go to the app store, search "MyChart", open the app, select [facility], and log in with your MyChart username and password.  Due to Covid, a mask is required upon entering the hospital/clinic. If you do not have a mask, one will be given to you upon arrival. For doctor visits, patients may have 1 support person aged 18 or older with them. For treatment visits, patients cannot have anyone with them due to current Covid guidelines and our immunocompromised population.   

## 2021-07-02 ENCOUNTER — Other Ambulatory Visit (HOSPITAL_COMMUNITY): Payer: Self-pay

## 2021-07-02 LAB — KAPPA/LAMBDA LIGHT CHAINS
Kappa free light chain: 14.4 mg/L (ref 3.3–19.4)
Kappa, lambda light chain ratio: 1.66 — ABNORMAL HIGH (ref 0.26–1.65)
Lambda free light chains: 8.7 mg/L (ref 5.7–26.3)

## 2021-07-02 MED ORDER — OXYCODONE-ACETAMINOPHEN 10-325 MG PO TABS
ORAL_TABLET | ORAL | 0 refills | Status: DC
  Filled 2021-07-05: qty 60, 30d supply, fill #0

## 2021-07-05 ENCOUNTER — Other Ambulatory Visit (HOSPITAL_COMMUNITY): Payer: Self-pay

## 2021-07-06 ENCOUNTER — Other Ambulatory Visit: Payer: Medicare Other

## 2021-07-06 ENCOUNTER — Inpatient Hospital Stay: Payer: Medicare Other

## 2021-07-06 ENCOUNTER — Other Ambulatory Visit: Payer: Self-pay

## 2021-07-06 VITALS — BP 163/86 | HR 67 | Temp 98.5°F | Resp 18 | Wt 163.8 lb

## 2021-07-06 DIAGNOSIS — Z5112 Encounter for antineoplastic immunotherapy: Secondary | ICD-10-CM | POA: Diagnosis not present

## 2021-07-06 DIAGNOSIS — Z95828 Presence of other vascular implants and grafts: Secondary | ICD-10-CM

## 2021-07-06 DIAGNOSIS — C61 Malignant neoplasm of prostate: Secondary | ICD-10-CM

## 2021-07-06 DIAGNOSIS — C9001 Multiple myeloma in remission: Secondary | ICD-10-CM | POA: Diagnosis not present

## 2021-07-06 LAB — CBC WITH DIFFERENTIAL (CANCER CENTER ONLY)
Abs Immature Granulocytes: 0.01 10*3/uL (ref 0.00–0.07)
Basophils Absolute: 0 10*3/uL (ref 0.0–0.1)
Basophils Relative: 0 %
Eosinophils Absolute: 0.1 10*3/uL (ref 0.0–0.5)
Eosinophils Relative: 1 %
HCT: 33.2 % — ABNORMAL LOW (ref 39.0–52.0)
Hemoglobin: 11.3 g/dL — ABNORMAL LOW (ref 13.0–17.0)
Immature Granulocytes: 0 %
Lymphocytes Relative: 28 %
Lymphs Abs: 1.6 10*3/uL (ref 0.7–4.0)
MCH: 34.5 pg — ABNORMAL HIGH (ref 26.0–34.0)
MCHC: 34 g/dL (ref 30.0–36.0)
MCV: 101.2 fL — ABNORMAL HIGH (ref 80.0–100.0)
Monocytes Absolute: 0.5 10*3/uL (ref 0.1–1.0)
Monocytes Relative: 9 %
Neutro Abs: 3.7 10*3/uL (ref 1.7–7.7)
Neutrophils Relative %: 62 %
Platelet Count: 184 10*3/uL (ref 150–400)
RBC: 3.28 MIL/uL — ABNORMAL LOW (ref 4.22–5.81)
RDW: 12 % (ref 11.5–15.5)
WBC Count: 5.9 10*3/uL (ref 4.0–10.5)
nRBC: 0 % (ref 0.0–0.2)

## 2021-07-06 LAB — CMP (CANCER CENTER ONLY)
ALT: 28 U/L (ref 0–44)
AST: 24 U/L (ref 15–41)
Albumin: 4.3 g/dL (ref 3.5–5.0)
Alkaline Phosphatase: 68 U/L (ref 38–126)
Anion gap: 6 (ref 5–15)
BUN: 20 mg/dL (ref 8–23)
CO2: 27 mmol/L (ref 22–32)
Calcium: 9.1 mg/dL (ref 8.9–10.3)
Chloride: 103 mmol/L (ref 98–111)
Creatinine: 0.88 mg/dL (ref 0.61–1.24)
GFR, Estimated: >60 mL/min (ref >60)
Glucose, Bld: 89 mg/dL (ref 70–99)
Potassium: 4.3 mmol/L (ref 3.5–5.1)
Sodium: 136 mmol/L (ref 135–145)
Total Bilirubin: 0.5 mg/dL (ref 0.3–1.2)
Total Protein: 7 g/dL (ref 6.5–8.1)

## 2021-07-06 MED ORDER — ACETAMINOPHEN 325 MG PO TABS
650.0000 mg | ORAL_TABLET | Freq: Once | ORAL | Status: AC
  Administered 2021-07-06: 14:00:00 650 mg via ORAL
  Filled 2021-07-06: qty 2

## 2021-07-06 MED ORDER — DEXTROSE 5 % IV SOLN
56.0000 mg/m2 | Freq: Once | INTRAVENOUS | Status: AC
  Administered 2021-07-06: 15:00:00 110 mg via INTRAVENOUS
  Filled 2021-07-06: qty 30

## 2021-07-06 MED ORDER — SODIUM CHLORIDE 0.9 % IV SOLN: Freq: Once | INTRAVENOUS | Status: AC

## 2021-07-06 MED ORDER — SODIUM CHLORIDE 0.9% FLUSH
10.0000 mL | INTRAVENOUS | Status: DC | PRN
  Administered 2021-07-06: 13:00:00 10 mL

## 2021-07-06 MED ORDER — DARATUMUMAB-HYALURONIDASE-FIHJ 1800-30000 MG-UT/15ML ~~LOC~~ SOLN
1800.0000 mg | Freq: Once | SUBCUTANEOUS | Status: AC
  Administered 2021-07-06: 16:00:00 1800 mg via SUBCUTANEOUS
  Filled 2021-07-06: qty 15

## 2021-07-06 MED ORDER — HEPARIN SOD (PORK) LOCK FLUSH 100 UNIT/ML IV SOLN
500.0000 [IU] | Freq: Once | INTRAVENOUS | Status: AC | PRN
  Administered 2021-07-06: 16:00:00 500 [IU]

## 2021-07-06 MED ORDER — DIPHENHYDRAMINE HCL 25 MG PO CAPS
50.0000 mg | ORAL_CAPSULE | Freq: Once | ORAL | Status: AC
  Administered 2021-07-06: 14:00:00 50 mg via ORAL
  Filled 2021-07-06: qty 2

## 2021-07-06 MED ORDER — SODIUM CHLORIDE 0.9% FLUSH
10.0000 mL | INTRAVENOUS | Status: DC | PRN
  Administered 2021-07-06: 16:00:00 10 mL

## 2021-07-06 MED ORDER — SODIUM CHLORIDE 0.9 % IV SOLN
40.0000 mg | Freq: Once | INTRAVENOUS | Status: AC
  Administered 2021-07-06: 14:00:00 40 mg via INTRAVENOUS
  Filled 2021-07-06: qty 4

## 2021-07-06 NOTE — Patient Instructions (Signed)
[facility]  Discharge Instructions: Thank you for choosing [facility] to provide your oncology and hematology care.   If you have a lab appointment with the [facility], please go directly to the [facility] and check in at the registration area.   Wear comfortable clothing and clothing appropriate for easy access to any Portacath or PICC line.   We strive to give you quality time with your provider. You may need to reschedule your appointment if you arrive late (15 or more minutes).  Arriving late affects you and other patients whose appointments are after yours.  Also, if you miss three or more appointments without notifying the office, you may be dismissed from the clinic at the providers discretion.      For prescription refill requests, have your pharmacy contact our office and allow 72 hours for refills to be completed.    Today you received the following chemotherapy and/or immunotherapy agents: Carfilzomib (Kyprolis) and Daratumumab (Darzalex).   To help prevent nausea and vomiting after your treatment, we encourage you to take your nausea medication as directed.  BELOW ARE SYMPTOMS THAT SHOULD BE REPORTED IMMEDIATELY: *FEVER GREATER THAN 100.4 F (38 C) OR HIGHER *CHILLS OR SWEATING *NAUSEA AND VOMITING THAT IS NOT CONTROLLED WITH YOUR NAUSEA MEDICATION *UNUSUAL SHORTNESS OF BREATH *UNUSUAL BRUISING OR BLEEDING *URINARY PROBLEMS (pain or burning when urinating, or frequent urination) *BOWEL PROBLEMS (unusual diarrhea, constipation, pain near the anus) TENDERNESS IN MOUTH AND THROAT WITH OR WITHOUT PRESENCE OF ULCERS (sore throat, sores in mouth, or a toothache) UNUSUAL RASH, SWELLING OR PAIN  UNUSUAL VAGINAL DISCHARGE OR ITCHING   Items with * indicate a potential emergency and should be followed up as soon as possible or go to the Emergency Department if any problems should occur.  Please show the CHEMOTHERAPY ALERT CARD or  IMMUNOTHERAPY ALERT CARD at check-in to the Emergency Department and triage nurse.  Should you have questions after your visit or need to cancel or reschedule your appointment, please contact [facility]  Dept: [phone]  and follow the prompts.  Office hours are 8:00 a.m. to 4:30 p.m. Monday - Friday. Please note that voicemails left after 4:00 p.m. may not be returned until the following business day.  We are closed weekends and major holidays. You have access to a nurse at all times for urgent questions. Please call the main number to the clinic Dept: [phone] and follow the prompts.   For any non-urgent questions, you may also contact your provider using MyChart. We now offer e-Visits for anyone 76 and older to request care online for non-urgent symptoms. For details visit [email or web address].   Also download the MyChart app! Go to the app store, search "MyChart", open the app, select [facility], and log in with your MyChart username and password.  Due to Covid, a mask is required upon entering the hospital/clinic. If you do not have a mask, one will be given to you upon arrival. For doctor visits, patients may have 1 support person aged 60 or older with them. For treatment visits, patients cannot have anyone with them due to current Covid guidelines and our immunocompromised population.

## 2021-07-12 ENCOUNTER — Other Ambulatory Visit: Payer: Self-pay | Admitting: Oncology

## 2021-07-12 DIAGNOSIS — C9001 Multiple myeloma in remission: Secondary | ICD-10-CM

## 2021-07-13 ENCOUNTER — Other Ambulatory Visit: Payer: Self-pay

## 2021-07-13 ENCOUNTER — Inpatient Hospital Stay: Payer: Medicare Other

## 2021-07-13 ENCOUNTER — Other Ambulatory Visit: Payer: Medicare Other

## 2021-07-13 ENCOUNTER — Inpatient Hospital Stay (HOSPITAL_BASED_OUTPATIENT_CLINIC_OR_DEPARTMENT_OTHER): Payer: Medicare Other | Admitting: Oncology

## 2021-07-13 VITALS — BP 142/80 | HR 72 | Temp 96.7°F | Resp 17 | Wt 162.4 lb

## 2021-07-13 DIAGNOSIS — C9001 Multiple myeloma in remission: Secondary | ICD-10-CM | POA: Diagnosis not present

## 2021-07-13 DIAGNOSIS — Z95828 Presence of other vascular implants and grafts: Secondary | ICD-10-CM

## 2021-07-13 DIAGNOSIS — C61 Malignant neoplasm of prostate: Secondary | ICD-10-CM

## 2021-07-13 DIAGNOSIS — Z5112 Encounter for antineoplastic immunotherapy: Secondary | ICD-10-CM | POA: Diagnosis not present

## 2021-07-13 LAB — CBC WITH DIFFERENTIAL (CANCER CENTER ONLY)
Abs Immature Granulocytes: 0.02 10*3/uL (ref 0.00–0.07)
Basophils Absolute: 0 10*3/uL (ref 0.0–0.1)
Basophils Relative: 0 %
Eosinophils Absolute: 0.1 10*3/uL (ref 0.0–0.5)
Eosinophils Relative: 1 %
HCT: 33.6 % — ABNORMAL LOW (ref 39.0–52.0)
Hemoglobin: 11.5 g/dL — ABNORMAL LOW (ref 13.0–17.0)
Immature Granulocytes: 0 %
Lymphocytes Relative: 22 %
Lymphs Abs: 1.8 10*3/uL (ref 0.7–4.0)
MCH: 34.6 pg — ABNORMAL HIGH (ref 26.0–34.0)
MCHC: 34.2 g/dL (ref 30.0–36.0)
MCV: 101.2 fL — ABNORMAL HIGH (ref 80.0–100.0)
Monocytes Absolute: 0.6 10*3/uL (ref 0.1–1.0)
Monocytes Relative: 7 %
Neutro Abs: 5.7 10*3/uL (ref 1.7–7.7)
Neutrophils Relative %: 70 %
Platelet Count: 184 10*3/uL (ref 150–400)
RBC: 3.32 MIL/uL — ABNORMAL LOW (ref 4.22–5.81)
RDW: 11.9 % (ref 11.5–15.5)
WBC Count: 8.1 10*3/uL (ref 4.0–10.5)
nRBC: 0 % (ref 0.0–0.2)

## 2021-07-13 LAB — CMP (CANCER CENTER ONLY)
ALT: 43 U/L (ref 0–44)
AST: 20 U/L (ref 15–41)
Albumin: 4.3 g/dL (ref 3.5–5.0)
Alkaline Phosphatase: 70 U/L (ref 38–126)
Anion gap: 7 (ref 5–15)
BUN: 19 mg/dL (ref 8–23)
CO2: 27 mmol/L (ref 22–32)
Calcium: 9.6 mg/dL (ref 8.9–10.3)
Chloride: 100 mmol/L (ref 98–111)
Creatinine: 0.96 mg/dL (ref 0.61–1.24)
GFR, Estimated: >60 mL/min (ref >60)
Glucose, Bld: 100 mg/dL — ABNORMAL HIGH (ref 70–99)
Potassium: 4.3 mmol/L (ref 3.5–5.1)
Sodium: 134 mmol/L — ABNORMAL LOW (ref 135–145)
Total Bilirubin: 0.5 mg/dL (ref 0.3–1.2)
Total Protein: 7.2 g/dL (ref 6.5–8.1)

## 2021-07-13 LAB — MULTIPLE MYELOMA PANEL, SERUM
Albumin SerPl Elph-Mcnc: 4.2 g/dL (ref 2.9–4.4)
Albumin/Glob SerPl: 1.7 (ref 0.7–1.7)
Alpha 1: 0.2 g/dL (ref 0.0–0.4)
Alpha2 Glob SerPl Elph-Mcnc: 0.6 g/dL (ref 0.4–1.0)
B-Globulin SerPl Elph-Mcnc: 0.9 g/dL (ref 0.7–1.3)
Gamma Glob SerPl Elph-Mcnc: 0.8 g/dL (ref 0.4–1.8)
Globulin, Total: 2.6 g/dL (ref 2.2–3.9)
IgA: 18 mg/dL — ABNORMAL LOW (ref 61–437)
IgG (Immunoglobin G), Serum: 914 mg/dL (ref 603–1613)
IgM (Immunoglobulin M), Srm: 14 mg/dL — ABNORMAL LOW (ref 20–172)
M Protein SerPl Elph-Mcnc: 0.5 g/dL — ABNORMAL HIGH
Total Protein ELP: 6.8 g/dL (ref 6.0–8.5)

## 2021-07-13 MED ORDER — HEPARIN SOD (PORK) LOCK FLUSH 100 UNIT/ML IV SOLN
500.0000 [IU] | Freq: Once | INTRAVENOUS | Status: AC | PRN
  Administered 2021-07-13: 12:00:00 500 [IU]

## 2021-07-13 MED ORDER — DEXTROSE 5 % IV SOLN
56.0000 mg/m2 | Freq: Once | INTRAVENOUS | Status: AC
  Administered 2021-07-13: 11:00:00 110 mg via INTRAVENOUS
  Filled 2021-07-13: qty 30

## 2021-07-13 MED ORDER — SODIUM CHLORIDE 0.9 % IV SOLN: Freq: Once | INTRAVENOUS | Status: AC

## 2021-07-13 MED ORDER — SODIUM CHLORIDE 0.9% FLUSH
10.0000 mL | INTRAVENOUS | Status: DC | PRN
  Administered 2021-07-13: 12:00:00 10 mL

## 2021-07-13 MED ORDER — SODIUM CHLORIDE 0.9% FLUSH
10.0000 mL | INTRAVENOUS | Status: DC | PRN
  Administered 2021-07-13: 08:00:00 10 mL

## 2021-07-13 MED ORDER — SODIUM CHLORIDE 0.9 % IV SOLN
40.0000 mg | Freq: Once | INTRAVENOUS | Status: AC
  Administered 2021-07-13: 10:00:00 40 mg via INTRAVENOUS
  Filled 2021-07-13: qty 4

## 2021-07-13 NOTE — Patient Instructions (Signed)
[facility]  Discharge Instructions: Thank you for choosing [facility] to provide your oncology and hematology care.   If you have a lab appointment with the Cancer Center, please go directly to the Cancer Center and check in at the registration area.   Wear comfortable clothing and clothing appropriate for easy access to any Portacath or PICC line.   We strive to give you quality time with your provider. You may need to reschedule your appointment if you arrive late (15 or more minutes).  Arriving late affects you and other patients whose appointments are after yours.  Also, if you miss three or more appointments without notifying the office, you may be dismissed from the clinic at the provider's discretion.      For prescription refill requests, have your pharmacy contact our office and allow 72 hours for refills to be completed.    Today you received the following chemotherapy and/or immunotherapy agents: Kyprolis    To help prevent nausea and vomiting after your treatment, we encourage you to take your nausea medication as directed.  BELOW ARE SYMPTOMS THAT SHOULD BE REPORTED IMMEDIATELY: . *FEVER GREATER THAN 100.4 F (38 C) OR HIGHER . *CHILLS OR SWEATING . *NAUSEA AND VOMITING THAT IS NOT CONTROLLED WITH YOUR NAUSEA MEDICATION . *UNUSUAL SHORTNESS OF BREATH . *UNUSUAL BRUISING OR BLEEDING . *URINARY PROBLEMS (pain or burning when urinating, or frequent urination) . *BOWEL PROBLEMS (unusual diarrhea, constipation, pain near the anus) . TENDERNESS IN MOUTH AND THROAT WITH OR WITHOUT PRESENCE OF ULCERS (sore throat, sores in mouth, or a toothache) . UNUSUAL RASH, SWELLING OR PAIN  . UNUSUAL VAGINAL DISCHARGE OR ITCHING   Items with * indicate a potential emergency and should be followed up as soon as possible or go to the Emergency Department if any problems should occur.  Please show the CHEMOTHERAPY ALERT CARD or IMMUNOTHERAPY ALERT  CARD at check-in to the Emergency Department and triage nurse.  Should you have questions after your visit or need to cancel or reschedule your appointment, please contact [facility]  Dept: [phone]  and follow the prompts.  Office hours are 8:00 a.m. to 4:30 p.m. Monday - Friday. Please note that voicemails left after 4:00 p.m. may not be returned until the following business day.  We are closed weekends and major holidays. You have access to a nurse at all times for urgent questions. Please call the main number to the clinic Dept: [phone] and follow the prompts.   For any non-urgent questions, you may also contact your provider using MyChart. We now offer e-Visits for anyone 18 and older to request care online for non-urgent symptoms. For details visit [email or web address].   Also download the MyChart app! Go to the app store, search "MyChart", open the app, select [facility], and log in with your MyChart username and password.  Due to Covid, a mask is required upon entering the hospital/clinic. If you do not have a mask, one will be given to you upon arrival. For doctor visits, patients may have 1 support person aged 18 or older with them. For treatment visits, patients cannot have anyone with them due to current Covid guidelines and our immunocompromised population.   

## 2021-07-13 NOTE — Progress Notes (Signed)
Hematology and Oncology Follow Up Visit  Travis Nguyen [id number] 10/11/1950 70 y.o. 07/13/2021 8:09 AM Travis Nguyen, Travis Brow, MD   Principle Diagnosis: 70 year old man with multiple myeloma diagnosed in 2014.  He was found to have IgG kappa with relapsed disease in 2022.  Secondary diagnosis: Stage a T1c, Gleason score 3+4 = 7 prostate cancer that is currently in remission.  Prior Therapy: He was treated initially with Cytoxan, Velcade and Decadron and subsequently his regimen changed to a Velcade, Revlimid with dexamethasone.  He achieved remission at that time.   He is status post a robotic-assisted laparoscopic radical prostatectomy and bilateral lymph node dissection on 02/09/2014. The final pathology showed prostate adenocarcinoma Gleason score 4+3 equals 7 involving both lobes.    Current therapy: Carfilzomib, and dexamethasone, daratumumab started on July 28, 2020.  Dexamethasone and carfilzomib is weekly with daratumumab every 2 weeks.  He is here for the next cycle of therapy.  Interim History:  Travis Nguyen presents today for a follow-up evaluation.  Since the last visit, he reports no major changes in his health.  He has tolerated therapy reasonably well without any recent complaints.  He denies any nausea, vomiting or abdominal pain.  He denies any recent hospitalizations or illnesses.  His performance status and quality of life remains unchanged.    Medications: Updated on review Current Outpatient Medications  Medication Sig Dispense Refill   acyclovir (ZOVIRAX) 400 MG tablet TAKE 1 TABLET BY MOUTH DAILY. (Patient not taking: No sig reported) 90 tablet 3   amLODipine (NORVASC) 10 MG tablet Take 1 tablet by mouth once a day 90 tablet 1   calcium carbonate (TUMS - DOSED IN MG ELEMENTAL CALCIUM) 500 MG chewable tablet Chew 1 tablet by mouth daily as needed for indigestion or heartburn.     escitalopram (LEXAPRO) 10 MG tablet Take 1 tablet by mouth once a day  90 tablet 1   lidocaine-prilocaine (EMLA) cream APPLY TOPICALLY TO PORT-A-CATH DAILY AS NEEDED 30 g 2   losartan (COZAAR) 100 MG tablet TAKE 1 TABLET BY MOUTH ONCE A DAY (Patient taking differently: Take 100 mg by mouth daily.) 90 tablet 0   losartan (COZAAR) 100 MG tablet Take 1 tablet by mouth once a day 90 tablet 1   oxyCODONE-acetaminophen (PERCOCET) 10-325 MG tablet Take 1 tablet by mouth 2 times a day (Patient taking differently: Take 1 tablet by mouth 2 (two) times daily as needed for pain.) 60 tablet 0   oxyCODONE-acetaminophen (PERCOCET) 10-325 MG tablet Take 1 tablet by mouth twice daily as needed (07/05/21) 60 tablet 0   pantoprazole (PROTONIX) 20 MG tablet Take 1 tablet (20 mg total) by mouth daily. 14 tablet 0   vitamin C (ASCORBIC ACID) 500 MG tablet Take 500 mg by mouth daily.     No current facility-administered medications for this visit.   Facility-Administered Medications Ordered in Other Visits  Medication Dose Route Frequency Provider Last Rate Last Admin   sodium chloride flush (NS) 0.9 % injection 10 mL  10 mL Intracatheter PRN Wyatt Portela, MD   10 mL at 07/13/21 4098     Allergies:  Allergies  Allergen Reactions   Aspirin     Pt stated "raises BP"        Physical Exam:     Blood pressure (!) 142/80, pulse 72, temperature (!) 96.7 F (35.9 C), temperature source Tympanic, resp. rate 17, weight 162 lb 6.4 oz (73.7 kg), SpO2 100 %.  ECOG: 1   General appearance: Comfortable appearing without any discomfort Head: Normocephalic without any trauma Oropharynx: Mucous membranes are moist and pink without any thrush or ulcers. Eyes: Pupils are equal and round reactive to light. Lymph nodes: No cervical, supraclavicular, inguinal or axillary lymphadenopathy.   Heart:regular rate and rhythm.  S1 and S2 without leg edema. Lung: Clear without any rhonchi or wheezes.  No dullness to percussion. Abdomin: Soft, nontender, nondistended with good bowel  sounds.  No hepatosplenomegaly. Musculoskeletal: No joint deformity or effusion.  Full range of motion noted. Neurological: No deficits noted on motor, sensory and deep tendon reflex exam. Skin: No petechial rash or dryness.  Appeared moist.                 Lab Results: Lab Results  Component Value Date   WBC 5.9 07/06/2021   HGB 11.3 (L) 07/06/2021   HCT 33.2 (L) 07/06/2021   MCV 101.2 (H) 07/06/2021   PLT 184 07/06/2021     Chemistry      Component Value Date/Time   NA 136 07/06/2021 1318   NA 138 03/27/2017 0838   K 4.3 07/06/2021 1318   K 4.3 03/27/2017 0838   CL 103 07/06/2021 1318   CL 105 01/01/2013 0835   CO2 27 07/06/2021 1318   CO2 24 03/27/2017 0838   BUN 20 07/06/2021 1318   BUN 23.7 03/27/2017 0838   CREATININE 0.88 07/06/2021 1318   CREATININE 1.2 03/27/2017 0838      Component Value Date/Time   CALCIUM 9.1 07/06/2021 1318   CALCIUM 9.9 03/27/2017 0838   ALKPHOS 68 07/06/2021 1318   ALKPHOS 79 03/27/2017 0838   AST 24 07/06/2021 1318   AST 25 03/27/2017 0838   ALT 28 07/06/2021 1318   ALT 26 03/27/2017 0838   BILITOT 0.5 07/06/2021 1318   BILITOT 0.31 03/27/2017 0838       Latest Reference Range & Units 04/13/21 08:44 05/25/21 08:21  M Protein SerPl Elph-Mcnc Not Observed g/dL 0.6 (H) (C) Not Observed (C)  IFE 1  Comment ! (C) Comment (C)  Globulin, Total 2.2 - 3.9 g/dL 2.3 (C) 2.6 (C)  B-Globulin SerPl Elph-Mcnc 0.7 - 1.3 g/dL 0.7 (C) 0.9 (C)  IgG (Immunoglobin G), Serum 603 - 1,613 mg/dL 933 872  IgM (Immunoglobulin M), Srm 20 - 172 mg/dL 10 (L) 54  IgA 61 - 437 mg/dL 17 (L) 68     Latest Reference Range & Units 05/25/21 08:32 06/29/21 12:41  Kappa free light chain 3.3 - 19.4 mg/L 14.5 14.4  Lambda free light chains 5.7 - 26.3 mg/L 2.7 (L) 8.7  Kappa, lambda light chain ratio 0.26 - 1.65  5.37 (H) 1.66 (H)  (L): Data is abnormally low (H): Data is abnormally high    Impression and Plan:  70 year old man with:   1.  IgG  kappa multiple myeloma diagnosed in 2014 with relapsed disease in 2022.[name]  HIs disease status was updated at this time and treatment choices were reviewed.  Protein studies obtained on June 29, 2021 showed a near complete response at this time with M spike no longer detected and normalization of his IgG level.  His kappa to lambda ratio is also normalizing.  Risks and benefits of continuing this treatment for the time being were reviewed.    The plan is to discontinue carfilzomib after today's treatment and proceed with daratumumab maintenance.  He is currently receiving that   2.  Anemia: His hemoglobin continues to be  close to normal range.  Anemia is related to plasma cell disorder and chemotherapy.   3.  IV access: Port-A-Cath currently in use without any issues.   4.  Antiemetics: Compazine is available denies any nausea or vomiting.   5.  VZV prophylaxis: No reactivation noted at this time.  He is on acyclovir.   6. Followup: He will return next week for daratumumab maintenance lab MD follow-up in 6 weeks.   30  minutes were spent on this encounter.  The time was dedicated to reviewing laboratory data, disease status update and outlining future plan of care discussion.   Zola Button, MD 12/30/20228:09 AM

## 2021-07-17 ENCOUNTER — Other Ambulatory Visit: Payer: Self-pay

## 2021-07-17 ENCOUNTER — Encounter (HOSPITAL_COMMUNITY): Payer: Self-pay | Admitting: Emergency Medicine

## 2021-07-17 ENCOUNTER — Other Ambulatory Visit (HOSPITAL_COMMUNITY): Payer: Self-pay

## 2021-07-17 ENCOUNTER — Telehealth: Payer: Self-pay | Admitting: Oncology

## 2021-07-17 ENCOUNTER — Emergency Department (HOSPITAL_COMMUNITY): Payer: Medicare Other

## 2021-07-17 ENCOUNTER — Emergency Department (HOSPITAL_COMMUNITY)
Admission: EM | Admit: 2021-07-17 | Discharge: 2021-07-17 | Disposition: A | Discharge status: Home / Self Care | Payer: Medicare Other | Attending: Emergency Medicine | Admitting: Emergency Medicine

## 2021-07-17 ENCOUNTER — Encounter: Payer: Self-pay | Admitting: Oncology

## 2021-07-17 DIAGNOSIS — L03312 Cellulitis of back [any part except buttock]: Secondary | ICD-10-CM | POA: Diagnosis not present

## 2021-07-17 DIAGNOSIS — M546 Pain in thoracic spine: Secondary | ICD-10-CM | POA: Diagnosis present

## 2021-07-17 DIAGNOSIS — I2510 Atherosclerotic heart disease of native coronary artery without angina pectoris: Secondary | ICD-10-CM | POA: Diagnosis not present

## 2021-07-17 DIAGNOSIS — R0789 Other chest pain: Secondary | ICD-10-CM | POA: Insufficient documentation

## 2021-07-17 DIAGNOSIS — Z9049 Acquired absence of other specified parts of digestive tract: Secondary | ICD-10-CM | POA: Diagnosis not present

## 2021-07-17 DIAGNOSIS — R222 Localized swelling, mass and lump, trunk: Secondary | ICD-10-CM | POA: Diagnosis not present

## 2021-07-17 LAB — COMPREHENSIVE METABOLIC PANEL
ALT: 102 U/L — ABNORMAL HIGH (ref 0–44)
AST: 45 U/L — ABNORMAL HIGH (ref 15–41)
Albumin: 3.7 g/dL (ref 3.5–5.0)
Alkaline Phosphatase: 95 U/L (ref 38–126)
Anion gap: 8 (ref 5–15)
BUN: 7 mg/dL — ABNORMAL LOW (ref 8–23)
CO2: 24 mmol/L (ref 22–32)
Calcium: 8.7 mg/dL — ABNORMAL LOW (ref 8.9–10.3)
Chloride: 102 mmol/L (ref 98–111)
Creatinine, Ser: 0.84 mg/dL (ref 0.61–1.24)
GFR, Estimated: >60 mL/min (ref >60)
Glucose, Bld: 105 mg/dL — ABNORMAL HIGH (ref 70–99)
Potassium: 3.8 mmol/L (ref 3.5–5.1)
Sodium: 134 mmol/L — ABNORMAL LOW (ref 135–145)
Total Bilirubin: 0.7 mg/dL (ref 0.3–1.2)
Total Protein: 6.3 g/dL — ABNORMAL LOW (ref 6.5–8.1)

## 2021-07-17 LAB — CBC WITH DIFFERENTIAL/PLATELET
Abs Immature Granulocytes: 0.03 10*3/uL (ref 0.00–0.07)
Basophils Absolute: 0 10*3/uL (ref 0.0–0.1)
Basophils Relative: 0 %
Eosinophils Absolute: 0 10*3/uL (ref 0.0–0.5)
Eosinophils Relative: 0 %
HCT: 32.4 % — ABNORMAL LOW (ref 39.0–52.0)
Hemoglobin: 11.1 g/dL — ABNORMAL LOW (ref 13.0–17.0)
Immature Granulocytes: 0 %
Lymphocytes Relative: 13 %
Lymphs Abs: 1.3 10*3/uL (ref 0.7–4.0)
MCH: 34.7 pg — ABNORMAL HIGH (ref 26.0–34.0)
MCHC: 34.3 g/dL (ref 30.0–36.0)
MCV: 101.3 fL — ABNORMAL HIGH (ref 80.0–100.0)
Monocytes Absolute: 0.8 10*3/uL (ref 0.1–1.0)
Monocytes Relative: 9 %
Neutro Abs: 7.2 10*3/uL (ref 1.7–7.7)
Neutrophils Relative %: 78 %
Platelets: 167 10*3/uL (ref 150–400)
RBC: 3.2 MIL/uL — ABNORMAL LOW (ref 4.22–5.81)
RDW: 11.9 % (ref 11.5–15.5)
WBC: 9.3 10*3/uL (ref 4.0–10.5)
nRBC: 0 % (ref 0.0–0.2)

## 2021-07-17 LAB — LACTIC ACID, PLASMA: Lactic Acid, Venous: 1.1 mmol/L (ref 0.5–1.9)

## 2021-07-17 LAB — KAPPA/LAMBDA LIGHT CHAINS
Kappa free light chain: 13.1 mg/L (ref 3.3–19.4)
Kappa, lambda light chain ratio: 4.52 — ABNORMAL HIGH (ref 0.26–1.65)
Lambda free light chains: 2.9 mg/L — ABNORMAL LOW (ref 5.7–26.3)

## 2021-07-17 MED ORDER — DOXYCYCLINE HYCLATE 100 MG PO CAPS
100.0000 mg | ORAL_CAPSULE | Freq: Two times a day (BID) | ORAL | 0 refills | Status: DC
  Filled 2021-07-17: qty 20, 10d supply, fill #0

## 2021-07-17 NOTE — ED Notes (Signed)
Pt DC by provider.

## 2021-07-17 NOTE — ED Provider Notes (Signed)
[facility] EMERGENCY DEPARTMENT Provider Note   CSN: [id number] Arrival date & time: 07/17/21  1034     History  Chief Complaint  Patient presents with   Back Pain    Travis Nguyen is a 71 y.o. male.  Patient with history of multiple myeloma actively receiving immunotherapy and chemotherapy presents today with back pain. He states that same began 1 week ago with swelling and pain over the thoracic spine. Last chemo was last Friday. Denies any deficits including numbness/tingling in extremities or sharp shooting pain. He is able to walk and move all extremities without difficulty. Denies fevers or chills.  The history is provided by the patient. No language interpreter was used.  Back Pain Associated symptoms: no chest pain, no fever, no headaches, no numbness and no weakness       Home Medications Prior to Admission medications   Medication Sig Start Date End Date Taking? Authorizing Provider  amLODipine (NORVASC) 10 MG tablet Take 1 tablet by mouth once a day Patient taking differently: Take 10 mg by mouth daily. 04/24/21  Yes   escitalopram (LEXAPRO) 10 MG tablet Take 1 tablet by mouth once a day Patient taking differently: Take 10 mg by mouth daily. 04/24/21  Yes   lidocaine-prilocaine (EMLA) cream APPLY TOPICALLY TO PORT-A-CATH DAILY AS NEEDED Patient taking differently: 1 application daily as needed (port). 01/29/21 01/29/22 Yes Wyatt Portela, MD  losartan (COZAAR) 100 MG tablet Take 1 tablet by mouth once a day Patient taking differently: Take 100 mg by mouth daily. 04/24/21  Yes   oxyCODONE-acetaminophen (PERCOCET) 10-325 MG tablet Take 1 tablet by mouth 2 times a day Patient taking differently: Take 1 tablet by mouth 2 (two) times daily as needed for pain. 12/15/20  Yes   vitamin C (ASCORBIC ACID) 500 MG tablet Take 500 mg by mouth daily.   Yes [provider]  acyclovir (ZOVIRAX) 400 MG tablet TAKE 1 TABLET BY MOUTH DAILY. Patient not  taking: Reported on 12/14/2020 09/28/20 09/28/21  Wyatt Portela, MD  losartan (COZAAR) 100 MG tablet TAKE 1 TABLET BY MOUTH ONCE A DAY Patient not taking: Reported on 07/17/2021 06/06/20 06/06/21  [name], MD  oxyCODONE-acetaminophen (PERCOCET) 10-325 MG tablet Take 1 tablet by mouth twice daily as needed (07/05/21) Patient not taking: Reported on 07/17/2021 07/02/21     pantoprazole (PROTONIX) 20 MG tablet Take 1 tablet (20 mg total) by mouth daily. Patient not taking: Reported on 07/17/2021 01/07/21   Quintella Reichert, MD      Allergies    Aspirin    Review of Systems   Review of Systems  Constitutional:  Negative for chills and fever.  Respiratory:  Negative for shortness of breath.   Cardiovascular:  Negative for chest pain.  Gastrointestinal:  Negative for diarrhea, nausea and vomiting.  Musculoskeletal:  Positive for back pain. Negative for gait problem, neck pain and neck stiffness.  Skin:  Positive for wound.  Neurological:  Negative for dizziness, tremors, seizures, syncope, facial asymmetry, speech difficulty, weakness, light-headedness, numbness and headaches.  Psychiatric/Behavioral:  Negative for confusion and decreased concentration.   All other systems reviewed and are negative.  Physical Exam Updated Vital Signs BP (!) 150/82 (BP Location: Right Arm)    Pulse 74    Temp 99 F (37.2 C) (Oral)    Resp 18    SpO2 99%  Physical Exam Vitals and nursing note reviewed.  Constitutional:      General: He is not in  acute distress.    Appearance: Normal appearance. He is normal weight. He is not ill-appearing, toxic-appearing or diaphoretic.  HENT:     Head: Normocephalic and atraumatic.  Eyes:     Extraocular Movements: Extraocular movements intact.     Pupils: Pupils are equal, round, and reactive to light.  Cardiovascular:     Rate and Rhythm: Normal rate and regular rhythm.     Heart sounds: Normal heart sounds.  Pulmonary:     Effort: Pulmonary effort is normal. No  respiratory distress.     Breath sounds: Normal breath sounds.  Abdominal:     General: Abdomen is flat.     Palpations: Abdomen is soft.  Musculoskeletal:        General: Normal range of motion.     Cervical back: Normal range of motion and neck supple.     Comments: 3 cm x 4 cm area of fluctuance and induration with erythema over the thoracic spine  Skin:    General: Skin is warm and dry.  Neurological:     General: No focal deficit present.     Mental Status: He is alert.     Comments: Ambulatory without difficulty  5/5 ROM and strength in bilateral upper and lower extremities  Psychiatric:        Mood and Affect: Mood normal.        Behavior: Behavior normal.    ED Results / Procedures / Treatments   Labs (all labs ordered are listed, but only abnormal results are displayed) Labs Reviewed  CBC WITH DIFFERENTIAL/PLATELET - Abnormal; Notable for the following components:      Result Value   RBC 3.20 (*)    Hemoglobin 11.1 (*)    HCT 32.4 (*)    MCV 101.3 (*)    MCH 34.7 (*)    All other components within normal limits  COMPREHENSIVE METABOLIC PANEL - Abnormal; Notable for the following components:   Sodium 134 (*)    Glucose, Bld 105 (*)    BUN 7 (*)    Calcium 8.7 (*)    Total Protein 6.3 (*)    AST 45 (*)    ALT 102 (*)    All other components within normal limits  LACTIC ACID, PLASMA  LACTIC ACID, PLASMA    EKG None  Radiology CT Chest Wo Contrast  Result Date: 07/17/2021 CLINICAL DATA:  Swelling and discoloration in thoracic spine, denies fever and chills EXAM: CT CHEST WITHOUT CONTRAST TECHNIQUE: Multidetector CT imaging of the chest was performed following the standard protocol without IV contrast. COMPARISON:  Chest radiograph 12/14/2020 FINDINGS: Cardiovascular: The heart is not enlarged. There is no pericardial effusion. Coronary artery calcifications are noted. A right chest wall port is in place with the tip terminating at the cavoatrial junction.  Mediastinum/Nodes: The imaged thyroid is unremarkable. The esophagus is grossly unremarkable. There is no mediastinal or axillary lymphadenopathy. There is no bulky hilar lymphadenopathy, within the confines of noncontrast technique. Lungs/Pleura: The trachea and central airways are patent. There is no focal consolidation or pulmonary edema. There is no pleural effusion or pneumothorax. There are no suspicious nodules. Upper Abdomen: Bilateral renal calculi are noted. Cholecystectomy clips are noted. There is no acute abnormality in the imaged upper abdomen. Musculoskeletal: There is no acute osseous abnormality. There is no suspicious osseous lesion. Other: There is skin thickening with underlying fat stranding and trace fluid in the soft tissues of the midline back centered at the T6-T7 level. There is no  definite organized or drainable fluid collection, within the confines of noncontrast technique. IMPRESSION: 1. Mild skin thickening and underlying inflammatory change in the soft tissues of the midline back centered at the T6-T7 level could reflect cellulitis. No definite evidence of organized or drainable fluid collection, within the confines of noncontrast technique. Correlate with physical exam. 2. Clear lungs. 3. Small bilateral renal stones. Electronically Signed   By: Valetta Mole M.D.   On: 07/17/2021 12:23    Procedures Procedures    Medications Ordered in ED Medications - No data to display  ED Course/ Medical Decision Making/ A&P                           Medical Decision Making  This patient presents to the ED for concern of back wound, this involves an extensive number of treatment options, and is a complaint that carries with it a high risk of complications and morbidity.  The differential diagnosis includes cellulitis vs drainable abscess vs abscess with spinal involvement   Co morbidities that complicate the patient evaluation  Multiple myeloma actively receiving  chemotherapy    Lab Tests:  I Ordered, and personally interpreted labs.  The pertinent results include:  no leukocytosis, anemia consistent with baseline. Lactic normal   Imaging Studies ordered:  I ordered imaging studies including CT chest  I independently visualized and interpreted imaging which showed 1. Mild skin thickening and underlying inflammatory change in the soft tissues of the midline back centered at the T6-T7 level could reflect cellulitis. No definite evidence of organized or drainable fluid collection, within the confines of noncontrast technique. I agree with the radiologist interpretation   Dispostion:  After consideration of the diagnostic results and the patients response to treatment, I feel that the patent would benefit from outpatient antibiotics. Feel that patients wound is cellulitis, no drainable area noted on CT scan or spinal cord involvement. He is afebrile, non-toxic appearing, and in no acute distress with reassuring vital signs. Will prescribe antibiotics and close follow-up. Patient understanding, educated on red flag symptoms that would prompt immediate return. Discharged in stable condition.    This is a shared visit with supervising physician Dr. Pearline Cables who has independently evaluated patient & provided guidance in evaluation/management/disposition, in agreement with care   Final Clinical Impression(s) / ED Diagnoses Final diagnoses:  Cellulitis of back except buttock    Rx / DC Orders ED Discharge Orders          Ordered    doxycycline (VIBRAMYCIN) 100 MG capsule  2 times daily        07/17/21 1658          An After Visit Summary was printed and given to the patient.     Bud Face, PA-C 91/66/06 0045    Lianne Cure, DO 99/77/41 1704

## 2021-07-17 NOTE — ED Provider Notes (Signed)
Emergency Medicine Provider Triage Evaluation Note  Travis Nguyen , a 71 y.o. male  was evaluated in triage.  Pt complains of back pain. He states that same began 1 week ago with swelling and erythema over his thoracic spine. Hx of multiple myeloma active chemo patient, last chemo Friday. Denies any deficits including numbness/tingling in extremities or sharp shooting pain. He is able to walk and move all extremities without difficulty. Denies fevers or chills  Review of Systems  Positive:  Negative: See above  Physical Exam  BP (!) 150/82 (BP Location: Right Arm)    Pulse 74    Temp 99 F (37.2 C) (Oral)    Resp 18    SpO2 99%  Gen:   Awake, no distress   Resp:  Normal effort  MSK:   Moves extremities without difficulty  Other:  Fluctuant and indurated erythematous area over the thoracic spine  Medical Decision Making  Medically screening exam initiated at 11:14 AM.  Appropriate orders placed.  [name] was informed that the remainder of the evaluation will be completed by another provider, this initial triage assessment does not replace that evaluation, and the importance of remaining in the ED until their evaluation is complete.     Bud Face, PA-C 17/91/50 [id number]    Campbell Stall P, DO 94/80/16 1703

## 2021-07-17 NOTE — Discharge Instructions (Signed)
Your work-up in the ER was reassuring today. Feel that you likely just have cellulitis which is an infection of the soft tissue of your skin. Please take full course of antibiotics as prescribed.  Return if development of new or worsening symptoms.

## 2021-07-17 NOTE — ED Notes (Signed)
Pt ambulatory around nurses station at this time.

## 2021-07-17 NOTE — ED Triage Notes (Signed)
C/o swelling, pain, and discoloration to thoracic back x 1 week.  Denies fever and chills.

## 2021-07-17 NOTE — ED Notes (Signed)
Pt refused DC vital signs

## 2021-07-17 NOTE — Telephone Encounter (Signed)
Scheduled per 12/30 los, pt has been called and confirmed appt

## 2021-07-18 ENCOUNTER — Encounter: Payer: Self-pay | Admitting: Oncology

## 2021-07-18 LAB — MULTIPLE MYELOMA PANEL, SERUM
Albumin SerPl Elph-Mcnc: 3.9 g/dL (ref 2.9–4.4)
Albumin/Glob SerPl: 1.4 (ref 0.7–1.7)
Alpha 1: 0.2 g/dL (ref 0.0–0.4)
Alpha2 Glob SerPl Elph-Mcnc: 0.7 g/dL (ref 0.4–1.0)
B-Globulin SerPl Elph-Mcnc: 1 g/dL (ref 0.7–1.3)
Gamma Glob SerPl Elph-Mcnc: 0.9 g/dL (ref 0.4–1.8)
Globulin, Total: 2.9 g/dL (ref 2.2–3.9)
IgA: 19 mg/dL — ABNORMAL LOW (ref 61–437)
IgG (Immunoglobin G), Serum: 915 mg/dL (ref 603–1613)
IgM (Immunoglobulin M), Srm: 15 mg/dL — ABNORMAL LOW (ref 20–172)
M Protein SerPl Elph-Mcnc: 0.6 g/dL — ABNORMAL HIGH
Total Protein ELP: 6.8 g/dL (ref 6.0–8.5)

## 2021-07-20 ENCOUNTER — Inpatient Hospital Stay: Payer: Medicare Other

## 2021-07-20 ENCOUNTER — Other Ambulatory Visit: Payer: Self-pay

## 2021-07-20 ENCOUNTER — Inpatient Hospital Stay: Payer: Medicare Other | Attending: Oncology

## 2021-07-20 ENCOUNTER — Other Ambulatory Visit: Payer: Self-pay | Admitting: Oncology

## 2021-07-20 VITALS — BP 150/85 | HR 62 | Temp 99.0°F | Resp 18

## 2021-07-20 DIAGNOSIS — Z95828 Presence of other vascular implants and grafts: Secondary | ICD-10-CM

## 2021-07-20 DIAGNOSIS — C9001 Multiple myeloma in remission: Secondary | ICD-10-CM | POA: Diagnosis not present

## 2021-07-20 DIAGNOSIS — Z5112 Encounter for antineoplastic immunotherapy: Secondary | ICD-10-CM | POA: Insufficient documentation

## 2021-07-20 DIAGNOSIS — C61 Malignant neoplasm of prostate: Secondary | ICD-10-CM

## 2021-07-20 LAB — CMP (CANCER CENTER ONLY)
ALT: 46 U/L — ABNORMAL HIGH (ref 0–44)
AST: 17 U/L (ref 15–41)
Albumin: 4.1 g/dL (ref 3.5–5.0)
Alkaline Phosphatase: 83 U/L (ref 38–126)
Anion gap: 8 (ref 5–15)
BUN: 19 mg/dL (ref 8–23)
CO2: 25 mmol/L (ref 22–32)
Calcium: 9 mg/dL (ref 8.9–10.3)
Chloride: 100 mmol/L (ref 98–111)
Creatinine: 0.81 mg/dL (ref 0.61–1.24)
GFR, Estimated: >60 mL/min (ref >60)
Glucose, Bld: 91 mg/dL (ref 70–99)
Potassium: 4.3 mmol/L (ref 3.5–5.1)
Sodium: 133 mmol/L — ABNORMAL LOW (ref 135–145)
Total Bilirubin: 0.5 mg/dL (ref 0.3–1.2)
Total Protein: 7.2 g/dL (ref 6.5–8.1)

## 2021-07-20 LAB — CBC WITH DIFFERENTIAL (CANCER CENTER ONLY)
Abs Immature Granulocytes: 0.04 10*3/uL (ref 0.00–0.07)
Basophils Absolute: 0 10*3/uL (ref 0.0–0.1)
Basophils Relative: 0 %
Eosinophils Absolute: 0.1 10*3/uL (ref 0.0–0.5)
Eosinophils Relative: 1 %
HCT: 31.8 % — ABNORMAL LOW (ref 39.0–52.0)
Hemoglobin: 10.8 g/dL — ABNORMAL LOW (ref 13.0–17.0)
Immature Granulocytes: 1 %
Lymphocytes Relative: 21 %
Lymphs Abs: 1.4 10*3/uL (ref 0.7–4.0)
MCH: 33.9 pg (ref 26.0–34.0)
MCHC: 34 g/dL (ref 30.0–36.0)
MCV: 99.7 fL (ref 80.0–100.0)
Monocytes Absolute: 0.8 10*3/uL (ref 0.1–1.0)
Monocytes Relative: 12 %
Neutro Abs: 4.3 10*3/uL (ref 1.7–7.7)
Neutrophils Relative %: 65 %
Platelet Count: 200 10*3/uL (ref 150–400)
RBC: 3.19 MIL/uL — ABNORMAL LOW (ref 4.22–5.81)
RDW: 11.7 % (ref 11.5–15.5)
WBC Count: 6.6 10*3/uL (ref 4.0–10.5)
nRBC: 0 % (ref 0.0–0.2)

## 2021-07-20 MED ORDER — ACETAMINOPHEN 325 MG PO TABS
650.0000 mg | ORAL_TABLET | Freq: Once | ORAL | Status: AC
  Administered 2021-07-20: 650 mg via ORAL
  Filled 2021-07-20: qty 2

## 2021-07-20 MED ORDER — HEPARIN SOD (PORK) LOCK FLUSH 100 UNIT/ML IV SOLN
500.0000 [IU] | Freq: Once | INTRAVENOUS | Status: AC | PRN
  Administered 2021-07-20: 500 [IU]

## 2021-07-20 MED ORDER — DIPHENHYDRAMINE HCL 25 MG PO CAPS
50.0000 mg | ORAL_CAPSULE | Freq: Once | ORAL | Status: AC
  Administered 2021-07-20: 50 mg via ORAL
  Filled 2021-07-20: qty 2

## 2021-07-20 MED ORDER — DARATUMUMAB-HYALURONIDASE-FIHJ 1800-30000 MG-UT/15ML ~~LOC~~ SOLN
1800.0000 mg | Freq: Once | SUBCUTANEOUS | Status: AC
  Administered 2021-07-20: 1800 mg via SUBCUTANEOUS
  Filled 2021-07-20: qty 15

## 2021-07-20 MED ORDER — DEXAMETHASONE 4 MG PO TABS
40.0000 mg | ORAL_TABLET | Freq: Once | ORAL | Status: AC
  Administered 2021-07-20: 40 mg via ORAL
  Filled 2021-07-20: qty 10

## 2021-07-20 MED ORDER — SODIUM CHLORIDE 0.9% FLUSH
10.0000 mL | INTRAVENOUS | Status: DC | PRN
  Administered 2021-07-20: 10 mL

## 2021-07-20 NOTE — Progress Notes (Signed)
Per Dr.[name], pt to only get darzalex faspro injection, ok to change IV dex to PO, will notify pharmacy.

## 2021-07-20 NOTE — Patient Instructions (Signed)
[facility] MEDICAL [facility]COL[facility]   Discharge Instructions: Thank you for choosing [facility] to provide your oncology and hematology care.   If you have a lab appointment with the Cancer Center, please go directly to the Cancer Center and check in at the registration area.   Wear comfortable clothing and clothing appropriate for easy access to any Portacath or PICC line.   We strive to give you quality time with your provider. You may need to reschedule your appointment if you arrive late (15 or more minutes).  Arriving late affects you and other patients whose appointments are after yours.  Also, if you miss three or more appointments without notifying the office, you may be dismissed from the clinic at the provider's discretion.      For prescription refill requests, have your pharmacy contact our office and allow 72 hours for refills to be completed.    Today you received the following chemotherapy and/or immunotherapy agents: Daratumumab (Darzalex)      To help prevent nausea and vomiting after your treatment, we encourage you to take your nausea medication as directed.  BELOW ARE SYMPTOMS THAT SHOULD BE REPORTED IMMEDIATELY: *FEVER GREATER THAN 100.4 F (38 C) OR HIGHER *CHILLS OR SWEATING *NAUSEA AND VOMITING THAT IS NOT CONTROLLED WITH YOUR NAUSEA MEDICATION *UNUSUAL SHORTNESS OF BREATH *UNUSUAL BRUISING OR BLEEDING *URINARY PROBLEMS (pain or burning when urinating, or frequent urination) *BOWEL PROBLEMS (unusual diarrhea, constipation, pain near the anus) TENDERNESS IN MOUTH AND THROAT WITH OR WITHOUT PRESENCE OF ULCERS (sore throat, sores in mouth, or a toothache) UNUSUAL RASH, SWELLING OR PAIN  UNUSUAL VAGINAL DISCHARGE OR ITCHING   Items with * indicate a potential emergency and should be followed up as soon as possible or go to the Emergency Department if any problems should occur.  Please show the CHEMOTHERAPY ALERT CARD or IMMUNOTHERAPY ALERT CARD  at check-in to the Emergency Department and triage nurse.  Should you have questions after your visit or need to cancel or reschedule your appointment, please contact [facility]  Dept: [phone]  and follow the prompts.  Office hours are 8:00 a.m. to 4:30 p.m. Monday - Friday. Please note that voicemails left after 4:00 p.m. may not be returned until the following business day.  We are closed weekends and major holidays. You have access to a nurse at all times for urgent questions. Please call the main number to the clinic Dept: [phone] and follow the prompts.   For any non-urgent questions, you may also contact your provider using MyChart. We now offer e-Visits for anyone 18 and older to request care online for non-urgent symptoms. For details visit [email or web address].   Also download the MyChart app! Go to the app store, search "MyChart", open the app, select , and log in with your MyChart username and password.  Due to Covid, a mask is required upon entering the hospital/clinic. If you do not have a mask, one will be given to you upon arrival. For doctor visits, patients may have 1 support person aged 18 or older with them. For treatment visits, patients cannot have anyone with them due to current Covid guidelines and our immunocompromised population.   

## 2021-07-23 ENCOUNTER — Other Ambulatory Visit (HOSPITAL_COMMUNITY): Payer: Self-pay

## 2021-07-24 ENCOUNTER — Other Ambulatory Visit (HOSPITAL_COMMUNITY): Payer: Self-pay

## 2021-07-24 DIAGNOSIS — Z0000 Encounter for general adult medical examination without abnormal findings: Secondary | ICD-10-CM | POA: Diagnosis not present

## 2021-07-24 DIAGNOSIS — Z23 Encounter for immunization: Secondary | ICD-10-CM | POA: Diagnosis not present

## 2021-07-24 DIAGNOSIS — M5416 Radiculopathy, lumbar region: Secondary | ICD-10-CM | POA: Diagnosis not present

## 2021-07-24 DIAGNOSIS — C9000 Multiple myeloma not having achieved remission: Secondary | ICD-10-CM | POA: Diagnosis not present

## 2021-07-24 DIAGNOSIS — E7800 Pure hypercholesterolemia, unspecified: Secondary | ICD-10-CM | POA: Diagnosis not present

## 2021-07-24 DIAGNOSIS — Z1211 Encounter for screening for malignant neoplasm of colon: Secondary | ICD-10-CM | POA: Diagnosis not present

## 2021-07-24 DIAGNOSIS — I10 Essential (primary) hypertension: Secondary | ICD-10-CM | POA: Diagnosis not present

## 2021-07-24 DIAGNOSIS — Z1389 Encounter for screening for other disorder: Secondary | ICD-10-CM | POA: Diagnosis not present

## 2021-07-24 MED ORDER — LOSARTAN POTASSIUM 100 MG PO TABS
ORAL_TABLET | ORAL | 1 refills | Status: DC
  Filled 2021-07-24 – 2022-02-18 (×2): qty 90, 90d supply, fill #0

## 2021-07-24 MED ORDER — AMLODIPINE BESYLATE 5 MG PO TABS
ORAL_TABLET | ORAL | 1 refills | Status: DC
  Filled 2021-07-24 – 2021-08-01 (×2): qty 90, 90d supply, fill #0

## 2021-07-24 MED ORDER — OXYCODONE-ACETAMINOPHEN 10-325 MG PO TABS
1.0000 | ORAL_TABLET | Freq: Two times a day (BID) | ORAL | 0 refills | Status: DC | PRN
  Filled 2021-08-04: qty 60, 30d supply, fill #0

## 2021-07-24 MED ORDER — ESCITALOPRAM OXALATE 10 MG PO TABS
ORAL_TABLET | ORAL | 1 refills | Status: DC
  Filled 2021-07-24 – 2022-02-18 (×2): qty 90, 90d supply, fill #0

## 2021-07-26 ENCOUNTER — Other Ambulatory Visit (HOSPITAL_COMMUNITY): Payer: Self-pay

## 2021-08-01 ENCOUNTER — Other Ambulatory Visit (HOSPITAL_COMMUNITY): Payer: Self-pay

## 2021-08-03 ENCOUNTER — Inpatient Hospital Stay: Payer: Medicare Other

## 2021-08-03 ENCOUNTER — Other Ambulatory Visit: Payer: Self-pay | Admitting: Oncology

## 2021-08-03 ENCOUNTER — Other Ambulatory Visit: Payer: Self-pay

## 2021-08-03 DIAGNOSIS — Z95828 Presence of other vascular implants and grafts: Secondary | ICD-10-CM

## 2021-08-03 DIAGNOSIS — Z5112 Encounter for antineoplastic immunotherapy: Secondary | ICD-10-CM | POA: Diagnosis not present

## 2021-08-03 DIAGNOSIS — C9001 Multiple myeloma in remission: Secondary | ICD-10-CM

## 2021-08-03 DIAGNOSIS — C61 Malignant neoplasm of prostate: Secondary | ICD-10-CM

## 2021-08-03 LAB — CBC WITH DIFFERENTIAL (CANCER CENTER ONLY)
Abs Immature Granulocytes: 0.01 10*3/uL (ref 0.00–0.07)
Basophils Absolute: 0 10*3/uL (ref 0.0–0.1)
Basophils Relative: 1 %
Eosinophils Absolute: 0.1 10*3/uL (ref 0.0–0.5)
Eosinophils Relative: 2 %
HCT: 32.5 % — ABNORMAL LOW (ref 39.0–52.0)
Hemoglobin: 10.9 g/dL — ABNORMAL LOW (ref 13.0–17.0)
Immature Granulocytes: 0 %
Lymphocytes Relative: 30 %
Lymphs Abs: 1.4 10*3/uL (ref 0.7–4.0)
MCH: 33.7 pg (ref 26.0–34.0)
MCHC: 33.5 g/dL (ref 30.0–36.0)
MCV: 100.6 fL — ABNORMAL HIGH (ref 80.0–100.0)
Monocytes Absolute: 0.3 10*3/uL (ref 0.1–1.0)
Monocytes Relative: 6 %
Neutro Abs: 2.9 10*3/uL (ref 1.7–7.7)
Neutrophils Relative %: 61 %
Platelet Count: 203 10*3/uL (ref 150–400)
RBC: 3.23 MIL/uL — ABNORMAL LOW (ref 4.22–5.81)
RDW: 12.2 % (ref 11.5–15.5)
WBC Count: 4.7 10*3/uL (ref 4.0–10.5)
nRBC: 0 % (ref 0.0–0.2)

## 2021-08-03 LAB — CMP (CANCER CENTER ONLY)
ALT: 19 U/L (ref 0–44)
AST: 20 U/L (ref 15–41)
Albumin: 4.2 g/dL (ref 3.5–5.0)
Alkaline Phosphatase: 67 U/L (ref 38–126)
Anion gap: 6 (ref 5–15)
BUN: 18 mg/dL (ref 8–23)
CO2: 25 mmol/L (ref 22–32)
Calcium: 9.3 mg/dL (ref 8.9–10.3)
Chloride: 106 mmol/L (ref 98–111)
Creatinine: 0.88 mg/dL (ref 0.61–1.24)
GFR, Estimated: >60 mL/min (ref >60)
Glucose, Bld: 95 mg/dL (ref 70–99)
Potassium: 4 mmol/L (ref 3.5–5.1)
Sodium: 137 mmol/L (ref 135–145)
Total Bilirubin: 0.5 mg/dL (ref 0.3–1.2)
Total Protein: 7.1 g/dL (ref 6.5–8.1)

## 2021-08-03 MED ORDER — SODIUM CHLORIDE 0.9% FLUSH
10.0000 mL | INTRAVENOUS | Status: DC | PRN
  Administered 2021-08-03: 10 mL

## 2021-08-04 ENCOUNTER — Other Ambulatory Visit (HOSPITAL_COMMUNITY): Payer: Self-pay

## 2021-08-06 LAB — KAPPA/LAMBDA LIGHT CHAINS
Kappa free light chain: 17.2 mg/L (ref 3.3–19.4)
Kappa, lambda light chain ratio: 4.78 — ABNORMAL HIGH (ref 0.26–1.65)
Lambda free light chains: 3.6 mg/L — ABNORMAL LOW (ref 5.7–26.3)

## 2021-08-07 LAB — MULTIPLE MYELOMA PANEL, SERUM
Albumin SerPl Elph-Mcnc: 3.6 g/dL (ref 2.9–4.4)
Albumin/Glob SerPl: 1.3 (ref 0.7–1.7)
Alpha 1: 0.3 g/dL (ref 0.0–0.4)
Alpha2 Glob SerPl Elph-Mcnc: 0.7 g/dL (ref 0.4–1.0)
B-Globulin SerPl Elph-Mcnc: 1.1 g/dL (ref 0.7–1.3)
Gamma Glob SerPl Elph-Mcnc: 1 g/dL (ref 0.4–1.8)
Globulin, Total: 3 g/dL (ref 2.2–3.9)
IgA: 24 mg/dL — ABNORMAL LOW (ref 61–437)
IgG (Immunoglobin G), Serum: 896 mg/dL (ref 603–1613)
IgM (Immunoglobulin M), Srm: 16 mg/dL — ABNORMAL LOW (ref 20–172)
M Protein SerPl Elph-Mcnc: 0.5 g/dL — ABNORMAL HIGH
Total Protein ELP: 6.6 g/dL (ref 6.0–8.5)

## 2021-08-15 ENCOUNTER — Encounter: Payer: Self-pay | Admitting: Oncology

## 2021-08-17 ENCOUNTER — Inpatient Hospital Stay: Payer: Medicare Other

## 2021-08-17 ENCOUNTER — Inpatient Hospital Stay (HOSPITAL_BASED_OUTPATIENT_CLINIC_OR_DEPARTMENT_OTHER): Payer: Medicare Other | Admitting: Oncology

## 2021-08-17 ENCOUNTER — Inpatient Hospital Stay: Payer: Medicare Other | Attending: Oncology

## 2021-08-17 ENCOUNTER — Other Ambulatory Visit: Payer: Self-pay

## 2021-08-17 VITALS — BP 139/82 | HR 78 | Temp 97.5°F | Resp 19 | Ht 73.0 in | Wt 162.2 lb

## 2021-08-17 DIAGNOSIS — Z79899 Other long term (current) drug therapy: Secondary | ICD-10-CM | POA: Diagnosis not present

## 2021-08-17 DIAGNOSIS — C9001 Multiple myeloma in remission: Secondary | ICD-10-CM

## 2021-08-17 DIAGNOSIS — C9002 Multiple myeloma in relapse: Secondary | ICD-10-CM | POA: Diagnosis not present

## 2021-08-17 DIAGNOSIS — Z886 Allergy status to analgesic agent status: Secondary | ICD-10-CM | POA: Diagnosis not present

## 2021-08-17 DIAGNOSIS — Z95828 Presence of other vascular implants and grafts: Secondary | ICD-10-CM

## 2021-08-17 DIAGNOSIS — Z8546 Personal history of malignant neoplasm of prostate: Secondary | ICD-10-CM | POA: Insufficient documentation

## 2021-08-17 DIAGNOSIS — D649 Anemia, unspecified: Secondary | ICD-10-CM | POA: Insufficient documentation

## 2021-08-17 DIAGNOSIS — Z5112 Encounter for antineoplastic immunotherapy: Secondary | ICD-10-CM | POA: Diagnosis not present

## 2021-08-17 DIAGNOSIS — C61 Malignant neoplasm of prostate: Secondary | ICD-10-CM | POA: Diagnosis not present

## 2021-08-17 LAB — CMP (CANCER CENTER ONLY)
ALT: 31 U/L (ref 0–44)
AST: 20 U/L (ref 15–41)
Albumin: 4.3 g/dL (ref 3.5–5.0)
Alkaline Phosphatase: 91 U/L (ref 38–126)
Anion gap: 6 (ref 5–15)
BUN: 16 mg/dL (ref 8–23)
CO2: 27 mmol/L (ref 22–32)
Calcium: 9.1 mg/dL (ref 8.9–10.3)
Chloride: 105 mmol/L (ref 98–111)
Creatinine: 0.96 mg/dL (ref 0.61–1.24)
GFR, Estimated: >60 mL/min (ref >60)
Glucose, Bld: 110 mg/dL — ABNORMAL HIGH (ref 70–99)
Potassium: 4.1 mmol/L (ref 3.5–5.1)
Sodium: 138 mmol/L (ref 135–145)
Total Bilirubin: 0.6 mg/dL (ref 0.3–1.2)
Total Protein: 7 g/dL (ref 6.5–8.1)

## 2021-08-17 LAB — CBC WITH DIFFERENTIAL (CANCER CENTER ONLY)
Abs Immature Granulocytes: 0.01 10*3/uL (ref 0.00–0.07)
Basophils Absolute: 0 10*3/uL (ref 0.0–0.1)
Basophils Relative: 0 %
Eosinophils Absolute: 0 10*3/uL (ref 0.0–0.5)
Eosinophils Relative: 1 %
HCT: 35 % — ABNORMAL LOW (ref 39.0–52.0)
Hemoglobin: 11.6 g/dL — ABNORMAL LOW (ref 13.0–17.0)
Immature Granulocytes: 0 %
Lymphocytes Relative: 31 %
Lymphs Abs: 1.6 10*3/uL (ref 0.7–4.0)
MCH: 33.2 pg (ref 26.0–34.0)
MCHC: 33.1 g/dL (ref 30.0–36.0)
MCV: 100.3 fL — ABNORMAL HIGH (ref 80.0–100.0)
Monocytes Absolute: 0.4 10*3/uL (ref 0.1–1.0)
Monocytes Relative: 8 %
Neutro Abs: 3.1 10*3/uL (ref 1.7–7.7)
Neutrophils Relative %: 60 %
Platelet Count: 188 10*3/uL (ref 150–400)
RBC: 3.49 MIL/uL — ABNORMAL LOW (ref 4.22–5.81)
RDW: 11.9 % (ref 11.5–15.5)
WBC Count: 5.2 10*3/uL (ref 4.0–10.5)
nRBC: 0 % (ref 0.0–0.2)

## 2021-08-17 MED ORDER — DEXAMETHASONE 4 MG PO TABS
40.0000 mg | ORAL_TABLET | Freq: Once | ORAL | Status: AC
  Administered 2021-08-17: 40 mg via ORAL
  Filled 2021-08-17: qty 10

## 2021-08-17 MED ORDER — HEPARIN SOD (PORK) LOCK FLUSH 100 UNIT/ML IV SOLN
500.0000 [IU] | Freq: Once | INTRAVENOUS | Status: AC | PRN
  Administered 2021-08-17: 500 [IU]

## 2021-08-17 MED ORDER — DIPHENHYDRAMINE HCL 25 MG PO CAPS
50.0000 mg | ORAL_CAPSULE | Freq: Once | ORAL | Status: AC
  Administered 2021-08-17: 50 mg via ORAL
  Filled 2021-08-17: qty 2

## 2021-08-17 MED ORDER — ACETAMINOPHEN 325 MG PO TABS
650.0000 mg | ORAL_TABLET | Freq: Once | ORAL | Status: AC
  Administered 2021-08-17: 650 mg via ORAL
  Filled 2021-08-17: qty 2

## 2021-08-17 MED ORDER — SODIUM CHLORIDE 0.9% FLUSH
10.0000 mL | INTRAVENOUS | Status: DC | PRN
  Administered 2021-08-17: 10 mL

## 2021-08-17 MED ORDER — DARATUMUMAB-HYALURONIDASE-FIHJ 1800-30000 MG-UT/15ML ~~LOC~~ SOLN
1800.0000 mg | Freq: Once | SUBCUTANEOUS | Status: AC
  Administered 2021-08-17: 1800 mg via SUBCUTANEOUS
  Filled 2021-08-17: qty 15

## 2021-08-17 NOTE — Patient Instructions (Signed)
[facility]  Discharge Instructions: Thank you for choosing [facility] to provide your oncology and hematology care.   If you have a lab appointment with the [facility], please go directly to the [facility] and check in at the registration area.   Wear comfortable clothing and clothing appropriate for easy access to any Portacath or PICC line.   We strive to give you quality time with your provider. You may need to reschedule your appointment if you arrive late (15 or more minutes).  Arriving late affects you and other patients whose appointments are after yours.  Also, if you miss three or more appointments without notifying the office, you may be dismissed from the clinic at the providers discretion.      For prescription refill requests, have your pharmacy contact our office and allow 72 hours for refills to be completed.    Today you received the following chemotherapy and/or immunotherapy agent: Darzalex Faspro    To help prevent nausea and vomiting after your treatment, we encourage you to take your nausea medication as directed.  BELOW ARE SYMPTOMS THAT SHOULD BE REPORTED IMMEDIATELY: *FEVER GREATER THAN 100.4 F (38 C) OR HIGHER *CHILLS OR SWEATING *NAUSEA AND VOMITING THAT IS NOT CONTROLLED WITH YOUR NAUSEA MEDICATION *UNUSUAL SHORTNESS OF BREATH *UNUSUAL BRUISING OR BLEEDING *URINARY PROBLEMS (pain or burning when urinating, or frequent urination) *BOWEL PROBLEMS (unusual diarrhea, constipation, pain near the anus) TENDERNESS IN MOUTH AND THROAT WITH OR WITHOUT PRESENCE OF ULCERS (sore throat, sores in mouth, or a toothache) UNUSUAL RASH, SWELLING OR PAIN  UNUSUAL VAGINAL DISCHARGE OR ITCHING   Items with * indicate a potential emergency and should be followed up as soon as possible or go to the Emergency Department if any problems should occur.  Please show the CHEMOTHERAPY ALERT CARD or IMMUNOTHERAPY ALERT CARD at check-in  to the Emergency Department and triage nurse.  Should you have questions after your visit or need to cancel or reschedule your appointment, please contact [facility]  Dept: [phone]  and follow the prompts.  Office hours are 8:00 a.m. to 4:30 p.m. Monday - Friday. Please note that voicemails left after 4:00 p.m. may not be returned until the following business day.  We are closed weekends and major holidays. You have access to a nurse at all times for urgent questions. Please call the main number to the clinic Dept: [phone] and follow the prompts.   For any non-urgent questions, you may also contact your provider using MyChart. We now offer e-Visits for anyone 42 and older to request care online for non-urgent symptoms. For details visit [email or web address].   Also download the MyChart app! Go to the app store, search "MyChart", open the app, select [facility], and log in with your MyChart username and password.  Due to Covid, a mask is required upon entering the hospital/clinic. If you do not have a mask, one will be given to you upon arrival. For doctor visits, patients may have 1 support person aged 49 or older with them. For treatment visits, patients cannot have anyone with them due to current Covid guidelines and our immunocompromised population.

## 2021-08-17 NOTE — Progress Notes (Signed)
Hematology and Oncology Follow Up Visit  Travis Nguyen [id number] June 25, 1951 71 y.o. 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   Secondary diagnosis: Stage a T1c, Gleason score 3+4 = 7 prostate cancer that is currently in remission.  Prior Therapy: He was treated initially with Cytoxan, Velcade and Decadron and subsequently his regimen changed to a Velcade, Revlimid with dexamethasone.  He achieved remission at that time.   He is status post a robotic-assisted laparoscopic radical prostatectomy and bilateral lymph node dissection on 02/09/2014. The final pathology showed prostate adenocarcinoma Gleason score 4+3 equals 7 involving both lobes.    Carfilzomib, and dexamethasone, daratumumab started on July 28, 2020.  Dexamethasone and carfilzomib is weekly with daratumumab every 2 weeks.  Therapy concluded in December 2022.  Current therapy: He is currently receiving Darzalex Faspro on a monthly maintenance basis starting in January 2023.  Interim History:  Mr. Travis Nguyen returns today for a follow-up visit.  Since the last visit, he reports feeling well without any major complaints.  He denies any nausea, vomiting or abdominal pain.  He denies any bone pain or pathological fractures.  He denies any hospitalizations or illnesses.  His performance status quality of life remains unchanged.    Medications: Reviewed without changes. Current Outpatient Medications  Medication Sig Dispense Refill   acyclovir (ZOVIRAX) 400 MG tablet TAKE 1 TABLET BY MOUTH DAILY. (Patient not taking: Reported on 12/14/2020) 90 tablet 3   amLODipine (NORVASC) 10 MG tablet Take 1 tablet by mouth once a day (Patient taking differently: Take 10 mg by mouth daily.) 90 tablet 1   amLODipine (NORVASC) 5 MG tablet Take 1 tablet by mouth once a day 90 tablet 1   doxycycline  (VIBRAMYCIN) 100 MG capsule Take 1 capsule by mouth 2 times daily. 20 capsule 0   escitalopram (LEXAPRO) 10 MG tablet Take 1 tablet by mouth once a day (Patient taking differently: Take 10 mg by mouth daily.) 90 tablet 1   escitalopram (LEXAPRO) 10 MG tablet Take 1 tablet by mouth once daily 90 tablet 1   lidocaine-prilocaine (EMLA) cream APPLY TOPICALLY TO PORT-A-CATH DAILY AS NEEDED (Patient taking differently: 1 application daily as needed (port).) 30 g 2   losartan (COZAAR) 100 MG tablet TAKE 1 TABLET BY MOUTH ONCE A DAY (Patient not taking: Reported on 07/17/2021) 90 tablet 0   losartan (COZAAR) 100 MG tablet Take 1 tablet by mouth once a day (Patient taking differently: Take 100 mg by mouth daily.) 90 tablet 1   losartan (COZAAR) 100 MG tablet Take 1 tablet by mouth daily 90 tablet 1   oxyCODONE-acetaminophen (PERCOCET) 10-325 MG tablet Take 1 tablet by mouth 2 times a day (Patient taking differently: Take 1 tablet by mouth 2 (two) times daily as needed for pain.) 60 tablet 0   oxyCODONE-acetaminophen (PERCOCET) 10-325 MG tablet Take 1 tablet by mouth twice daily as needed (07/05/21) (Patient not taking: Reported on 07/17/2021) 60 tablet 0   oxyCODONE-acetaminophen (PERCOCET) 10-325 MG tablet Take 1 tablet by mouth 2 (two) times daily as needed. 60 tablet 0   pantoprazole (PROTONIX) 20 MG tablet Take 1 tablet (20 mg total) by mouth daily. (Patient not taking: Reported on 07/17/2021) 14 tablet 0   vitamin C (ASCORBIC ACID) 500 MG tablet Take 500 mg by mouth daily.     No current facility-administered medications for this visit.  Allergies:  Allergies  Allergen Reactions   Aspirin     Pt stated "raises BP"        Physical Exam:    Blood pressure 139/82, pulse 78, temperature (!) 97.5 F (36.4 C), temperature source Tympanic, resp. rate 19, height _0  (1.854 m), weight 162 lb 3.2 oz (73.6 kg), SpO2 99 %.         ECOG: 1    General appearance: Alert, awake without any  distress. Head: Atraumatic without abnormalities Oropharynx: Without any thrush or ulcers. Eyes: No scleral icterus. Lymph nodes: No lymphadenopathy noted in the cervical, supraclavicular, or axillary nodes Heart:regular rate and rhythm, without any murmurs or gallops.   Lung: Clear to auscultation without any rhonchi, wheezes or dullness to percussion. Abdomin: Soft, nontender without any shifting dullness or ascites. Musculoskeletal: No clubbing or cyanosis. Neurological: No motor or sensory deficits. Skin: No rashes or lesions.                 Lab Results: Lab Results  Component Value Date   WBC 4.7 08/03/2021   HGB 10.9 (L) 08/03/2021   HCT 32.5 (L) 08/03/2021   MCV 100.6 (H) 08/03/2021   PLT 203 08/03/2021     Chemistry      Component Value Date/Time   NA 137 08/03/2021 1044   NA 138 03/27/2017 0838   K 4.0 08/03/2021 1044   K 4.3 03/27/2017 0838   CL 106 08/03/2021 1044   CL 105 01/01/2013 0835   CO2 25 08/03/2021 1044   CO2 24 03/27/2017 0838   BUN 18 08/03/2021 1044   BUN 23.7 03/27/2017 0838   CREATININE 0.88 08/03/2021 1044   CREATININE 1.2 03/27/2017 0838      Component Value Date/Time   CALCIUM 9.3 08/03/2021 1044   CALCIUM 9.9 03/27/2017 0838   ALKPHOS 67 08/03/2021 1044   ALKPHOS 79 03/27/2017 0838   AST 20 08/03/2021 1044   AST 25 03/27/2017 0838   ALT 19 08/03/2021 1044   ALT 26 03/27/2017 0838   BILITOT 0.5 08/03/2021 1044   BILITOT 0.31 03/27/2017 0838      Latest Reference Range & Units 06/29/21 12:41 07/13/21 07:55 08/03/21 10:47  M Protein SerPl Elph-Mcnc Not Observed g/dL 0.5 (H) (C) 0.6 (H) (C) 0.5 (H) (C)  IFE 1  Comment ! (C) Comment ! (C) Comment ! (C)  Globulin, Total 2.2 - 3.9 g/dL 2.6 (C) 2.9 (C) 3.0 (C)  B-Globulin SerPl Elph-Mcnc 0.7 - 1.3 g/dL 0.9 (C) 1.0 (C) 1.1 (C)  IgG (Immunoglobin G), Serum 603 - 1,613 mg/dL 914 915 896  IgM (Immunoglobulin M), Srm 20 - 172 mg/dL 14 (L) 15 (L) 16 (L)  IgA 61 - 437 mg/dL 18  (L) 19 (L) 24 (L)  (H): Data is abnormally high !: Data is abnormal (L): Data is abnormally low (C): Corrected   Impression and Plan:  71 year old man with:   1.  Relapsed multiple myeloma diagnosed in 2022 after initially presenting in 2014 with IgG kappa subtype  He is currently on maintenance Darzalex Faspro without any major complications.  Risks and benefits of continuing this treatment were reviewed at this time.  Protein studies obtained on August 03, 2021 showed M spike of 0.5 g/dL.  His kappa to lambda free ratio remains slightly elevated.  At this time I recommended continued maintenance therapy and use different salvage therapy options if he has progression of disease.  He is asymptomatic from his multiple myeloma at this time.  He is agreeable to proceed.   2.  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  Hemoglobin continues to improve.   3.  IV access: Port-A-Cath remains in use without any issues.   4.  Antiemetics: No nausea or vomiting reported at this time.   5.  VZV prophylaxis: He is on acyclovir without any reactivation.   6. Followup: He will continue to receive monthly maintenance of Darzalex Faspro   30  minutes were dedicated to this visit.  The time was spent on reviewing laboratory data, disease status update, treatment choices and complications related to therapy.   Zola Button, MD 2/3/20238:10 AM

## 2021-08-31 ENCOUNTER — Other Ambulatory Visit (HOSPITAL_COMMUNITY): Payer: Self-pay

## 2021-08-31 ENCOUNTER — Ambulatory Visit: Payer: Medicare Other

## 2021-08-31 ENCOUNTER — Other Ambulatory Visit: Payer: Medicare Other

## 2021-08-31 MED ORDER — OXYCODONE-ACETAMINOPHEN 10-325 MG PO TABS
ORAL_TABLET | ORAL | 0 refills | Status: DC
  Filled 2021-08-31 – 2021-09-03 (×2): qty 60, 30d supply, fill #0
  Filled ????-??-??: fill #0

## 2021-09-01 ENCOUNTER — Other Ambulatory Visit (HOSPITAL_COMMUNITY): Payer: Self-pay

## 2021-09-03 ENCOUNTER — Other Ambulatory Visit (HOSPITAL_COMMUNITY): Payer: Self-pay

## 2021-09-05 MED ORDER — MIDAZOLAM HCL 2 MG/2ML IJ SOLN
INTRAMUSCULAR | Status: AC
  Filled 2021-09-05: qty 2

## 2021-09-05 MED ORDER — FENTANYL CITRATE (PF) 250 MCG/5ML IJ SOLN
INTRAMUSCULAR | Status: AC
  Filled 2021-09-05: qty 5

## 2021-09-15 ENCOUNTER — Other Ambulatory Visit (HOSPITAL_COMMUNITY): Payer: Self-pay

## 2021-09-17 ENCOUNTER — Inpatient Hospital Stay: Payer: Medicare Other

## 2021-09-17 ENCOUNTER — Other Ambulatory Visit: Payer: Self-pay

## 2021-09-17 ENCOUNTER — Inpatient Hospital Stay (HOSPITAL_BASED_OUTPATIENT_CLINIC_OR_DEPARTMENT_OTHER): Payer: Medicare Other | Admitting: Oncology

## 2021-09-17 ENCOUNTER — Inpatient Hospital Stay: Payer: Medicare Other | Attending: Oncology

## 2021-09-17 VITALS — HR 60 | Resp 18

## 2021-09-17 VITALS — BP 151/89 | HR 65 | Temp 97.4°F | Resp 100 | Wt 162.3 lb

## 2021-09-17 DIAGNOSIS — C9002 Multiple myeloma in relapse: Secondary | ICD-10-CM | POA: Diagnosis present

## 2021-09-17 DIAGNOSIS — Z886 Allergy status to analgesic agent status: Secondary | ICD-10-CM | POA: Diagnosis not present

## 2021-09-17 DIAGNOSIS — Z5112 Encounter for antineoplastic immunotherapy: Secondary | ICD-10-CM | POA: Diagnosis not present

## 2021-09-17 DIAGNOSIS — D649 Anemia, unspecified: Secondary | ICD-10-CM | POA: Diagnosis not present

## 2021-09-17 DIAGNOSIS — C9000 Multiple myeloma not having achieved remission: Secondary | ICD-10-CM | POA: Diagnosis not present

## 2021-09-17 DIAGNOSIS — Z79899 Other long term (current) drug therapy: Secondary | ICD-10-CM | POA: Diagnosis not present

## 2021-09-17 DIAGNOSIS — Z8546 Personal history of malignant neoplasm of prostate: Secondary | ICD-10-CM | POA: Diagnosis not present

## 2021-09-17 DIAGNOSIS — C9001 Multiple myeloma in remission: Secondary | ICD-10-CM

## 2021-09-17 DIAGNOSIS — C61 Malignant neoplasm of prostate: Secondary | ICD-10-CM

## 2021-09-17 DIAGNOSIS — Z95828 Presence of other vascular implants and grafts: Secondary | ICD-10-CM

## 2021-09-17 LAB — CMP (CANCER CENTER ONLY)
ALT: 15 U/L (ref 0–44)
AST: 17 U/L (ref 15–41)
Albumin: 4.4 g/dL (ref 3.5–5.0)
Alkaline Phosphatase: 83 U/L (ref 38–126)
Anion gap: 6 (ref 5–15)
BUN: 13 mg/dL (ref 8–23)
CO2: 27 mmol/L (ref 22–32)
Calcium: 9.4 mg/dL (ref 8.9–10.3)
Chloride: 104 mmol/L (ref 98–111)
Creatinine: 0.91 mg/dL (ref 0.61–1.24)
GFR, Estimated: >60 mL/min (ref >60)
Glucose, Bld: 100 mg/dL — ABNORMAL HIGH (ref 70–99)
Potassium: 4.5 mmol/L (ref 3.5–5.1)
Sodium: 137 mmol/L (ref 135–145)
Total Bilirubin: 0.5 mg/dL (ref 0.3–1.2)
Total Protein: 7.2 g/dL (ref 6.5–8.1)

## 2021-09-17 LAB — CBC WITH DIFFERENTIAL (CANCER CENTER ONLY)
Abs Immature Granulocytes: 0.01 10*3/uL (ref 0.00–0.07)
Basophils Absolute: 0 10*3/uL (ref 0.0–0.1)
Basophils Relative: 0 %
Eosinophils Absolute: 0.1 10*3/uL (ref 0.0–0.5)
Eosinophils Relative: 2 %
HCT: 35.5 % — ABNORMAL LOW (ref 39.0–52.0)
Hemoglobin: 12.5 g/dL — ABNORMAL LOW (ref 13.0–17.0)
Immature Granulocytes: 0 %
Lymphocytes Relative: 41 %
Lymphs Abs: 1.9 10*3/uL (ref 0.7–4.0)
MCH: 34 pg (ref 26.0–34.0)
MCHC: 35.2 g/dL (ref 30.0–36.0)
MCV: 96.5 fL (ref 80.0–100.0)
Monocytes Absolute: 0.4 10*3/uL (ref 0.1–1.0)
Monocytes Relative: 9 %
Neutro Abs: 2.3 10*3/uL (ref 1.7–7.7)
Neutrophils Relative %: 48 %
Platelet Count: 191 10*3/uL (ref 150–400)
RBC: 3.68 MIL/uL — ABNORMAL LOW (ref 4.22–5.81)
RDW: 11.7 % (ref 11.5–15.5)
WBC Count: 4.7 10*3/uL (ref 4.0–10.5)
nRBC: 0 % (ref 0.0–0.2)

## 2021-09-17 MED ORDER — DARATUMUMAB-HYALURONIDASE-FIHJ 1800-30000 MG-UT/15ML ~~LOC~~ SOLN
1800.0000 mg | Freq: Once | SUBCUTANEOUS | Status: AC
  Administered 2021-09-17: 1800 mg via SUBCUTANEOUS
  Filled 2021-09-17: qty 15

## 2021-09-17 MED ORDER — DIPHENHYDRAMINE HCL 25 MG PO CAPS
50.0000 mg | ORAL_CAPSULE | Freq: Once | ORAL | Status: AC
  Administered 2021-09-17: 50 mg via ORAL
  Filled 2021-09-17: qty 2

## 2021-09-17 MED ORDER — SODIUM CHLORIDE 0.9% FLUSH
10.0000 mL | INTRAVENOUS | Status: DC | PRN
  Administered 2021-09-17: 10 mL

## 2021-09-17 MED ORDER — DEXAMETHASONE 4 MG PO TABS
40.0000 mg | ORAL_TABLET | Freq: Once | ORAL | Status: AC
  Administered 2021-09-17: 40 mg via ORAL
  Filled 2021-09-17: qty 10

## 2021-09-17 MED ORDER — HEPARIN SOD (PORK) LOCK FLUSH 100 UNIT/ML IV SOLN
500.0000 [IU] | Freq: Once | INTRAVENOUS | Status: AC | PRN
  Administered 2021-09-17: 500 [IU] via INTRAVENOUS

## 2021-09-17 MED ORDER — ACETAMINOPHEN 325 MG PO TABS
650.0000 mg | ORAL_TABLET | Freq: Once | ORAL | Status: AC
  Administered 2021-09-17: 650 mg via ORAL
  Filled 2021-09-17: qty 2

## 2021-09-17 NOTE — Patient Instructions (Signed)
[facility]  ? Discharge Instructions: ?Thank you for choosing [facility] to provide your oncology and hematology care.  ? ?If you have a lab appointment with the [facility], please go directly to the [facility] and check in at the registration area. ?  ?Wear comfortable clothing and clothing appropriate for easy access to any Portacath or PICC line.  ? ?We strive to give you quality time with your provider. You may need to reschedule your appointment if you arrive late (15 or more minutes).  Arriving late affects you and other patients whose appointments are after yours.  Also, if you miss three or more appointments without notifying the office, you may be dismissed from the clinic at the provider?s discretion.    ?  ?For prescription refill requests, have your pharmacy contact our office and allow 72 hours for refills to be completed.   ? ?Today you received the following chemotherapy and/or immunotherapy agents: Daratumumab hyaluronidase (Darzalex Faspro)    ?  ?To help prevent nausea and vomiting after your treatment, we encourage you to take your nausea medication as directed. ? ?BELOW ARE SYMPTOMS THAT SHOULD BE REPORTED IMMEDIATELY: ?*FEVER GREATER THAN 100.4 F (38 ?C) OR HIGHER ?*CHILLS OR SWEATING ?*NAUSEA AND VOMITING THAT IS NOT CONTROLLED WITH YOUR NAUSEA MEDICATION ?*UNUSUAL SHORTNESS OF BREATH ?*UNUSUAL BRUISING OR BLEEDING ?*URINARY PROBLEMS (pain or burning when urinating, or frequent urination) ?*BOWEL PROBLEMS (unusual diarrhea, constipation, pain near the anus) ?TENDERNESS IN MOUTH AND THROAT WITH OR WITHOUT PRESENCE OF ULCERS (sore throat, sores in mouth, or a toothache) ?UNUSUAL RASH, SWELLING OR PAIN  ?UNUSUAL VAGINAL DISCHARGE OR ITCHING  ? ?Items with * indicate a potential emergency and should be followed up as soon as possible or go to the Emergency Department if any problems should occur. ? ?Please show the CHEMOTHERAPY ALERT CARD or  IMMUNOTHERAPY ALERT CARD at check-in to the Emergency Department and triage nurse. ? ?Should you have questions after your visit or need to cancel or reschedule your appointment, please contact [facility]  Dept: [phone]  and follow the prompts.  Office hours are 8:00 a.m. to 4:30 p.m. Monday - Friday. Please note that voicemails left after 4:00 p.m. may not be returned until the following business day.  We are closed weekends and major holidays. You have access to a nurse at all times for urgent questions. Please call the main number to the clinic Dept: [phone] and follow the prompts. ? ? ?For any non-urgent questions, you may also contact your provider using MyChart. We now offer e-Visits for anyone 58 and older to request care online for non-urgent symptoms. For details visit [email or web address]. ?  ?Also download the MyChart app! Go to the app store, search "MyChart", open the app, select [facility], and log in with your MyChart username and password. ? ?Due to Covid, a mask is required upon entering the hospital/clinic. If you do not have a mask, one will be given to you upon arrival. For doctor visits, patients may have 1 support person aged 20 or older with them. For treatment visits, patients cannot have anyone with them due to current Covid guidelines and our immunocompromised population.  ? ?

## 2021-09-17 NOTE — Progress Notes (Signed)
Hematology and Oncology Follow Up Visit ? ?[name] ?[id number] ?[birth year] 71 y.o. ?09/17/2021 8:09 AM ?Kathline Magic, MD  ? ?Principle Diagnosis: 71 year old man with multiple myeloma diagnosed in 2014.  He was found to have IgG kappa subtype ? ?Secondary diagnosis: Stage a T1c, Gleason score 3+4 = 7 prostate cancer that is currently in remission. ? ?Prior Therapy: ?He was treated initially with Cytoxan, Velcade and Decadron and subsequently his regimen changed to a Velcade, Revlimid with dexamethasone.  He achieved remission at that time. ? ? ?He is status post a robotic-assisted laparoscopic radical prostatectomy and bilateral lymph node dissection on 02/09/2014. The final pathology showed prostate adenocarcinoma Gleason score 4+3 equals 7 involving both lobes.  ? ? ?Carfilzomib, and dexamethasone, daratumumab started on July 28, 2020.  Dexamethasone and carfilzomib is weekly with daratumumab every 2 weeks.  Therapy concluded in December 2022. ? ?Current therapy: He is currently receiving Darzalex Faspro on a monthly maintenance basis starting in January 2023. ? ?Interim History:  Mr. Tosi is here for a follow-up evaluation.  Since the last visit, he reports feeling well without any major complaints.  He denies any recent hospitalizations or illnesses.  He denies any complications related to his current treatment.  He denies any nausea, vomiting or worsening neuropathy.  He denies injection or infusion related complications. ? ? ? ?Medications: Updated on review. ?Current Outpatient Medications  ?Medication Sig Dispense Refill  ? acyclovir (ZOVIRAX) 400 MG tablet TAKE 1 TABLET BY MOUTH DAILY. (Patient not taking: Reported on 12/14/2020) 90 tablet 3  ? amLODipine (NORVASC) 10 MG tablet Take 1 tablet by mouth once a day (Patient taking differently: Take 10 mg by mouth daily.) 90 tablet 1  ? amLODipine (NORVASC) 5 MG tablet Take 1 tablet by mouth once a day 90 tablet 1  ? doxycycline  (VIBRAMYCIN) 100 MG capsule Take 1 capsule by mouth 2 times daily. 20 capsule 0  ? escitalopram (LEXAPRO) 10 MG tablet Take 1 tablet by mouth once a day (Patient taking differently: Take 10 mg by mouth daily.) 90 tablet 1  ? escitalopram (LEXAPRO) 10 MG tablet Take 1 tablet by mouth once daily 90 tablet 1  ? lidocaine-prilocaine (EMLA) cream APPLY TOPICALLY TO PORT-A-CATH DAILY AS NEEDED (Patient taking differently: 1 application daily as needed (port).) 30 g 2  ? losartan (COZAAR) 100 MG tablet TAKE 1 TABLET BY MOUTH ONCE A DAY (Patient not taking: Reported on 07/17/2021) 90 tablet 0  ? losartan (COZAAR) 100 MG tablet Take 1 tablet by mouth once a day (Patient taking differently: Take 100 mg by mouth daily.) 90 tablet 1  ? losartan (COZAAR) 100 MG tablet Take 1 tablet by mouth daily 90 tablet 1  ? oxyCODONE-acetaminophen (PERCOCET) 10-325 MG tablet Take 1 tablet by mouth 2 times a day (Patient taking differently: Take 1 tablet by mouth 2 (two) times daily as needed for pain.) 60 tablet 0  ? oxyCODONE-acetaminophen (PERCOCET) 10-325 MG tablet Take 1 tablet by mouth twice daily as needed (07/05/21) (Patient not taking: Reported on 07/17/2021) 60 tablet 0  ? oxyCODONE-acetaminophen (PERCOCET) 10-325 MG tablet Take 1 tablet by mouth 2 (two) times daily as needed. 60 tablet 0  ? oxyCODONE-acetaminophen (PERCOCET) 10-325 MG tablet Take 1 tablet by mouth 2 times a day as needed (30 days)...ok to fill 30 days after last refill 60 tablet 0  ? pantoprazole (PROTONIX) 20 MG tablet Take 1 tablet (20 mg total) by mouth daily. (Patient not taking: Reported on 07/17/2021)  14 tablet 0  ? vitamin C (ASCORBIC ACID) 500 MG tablet Take 500 mg by mouth daily.    ? ?No current facility-administered medications for this visit.  ? ? ? ?Allergies:  ?Allergies  ?Allergen Reactions  ? Aspirin   ?  Pt stated "raises BP"   ? ? ? ? ? ?Physical Exam: ? ? ? ? ? ? ? ? ?Blood pressure (!) 151/89, pulse 65, temperature (!) 97.4 ?F (36.3 ?C), temperature  source Tympanic, resp. rate (!) 100, weight 162 lb 5 oz (73.6 kg), SpO2 100 %. ? ? ? ? ?ECOG: 1 ? ? ?General appearance: Comfortable appearing without any discomfort ?Head: Normocephalic without any trauma ?Oropharynx: Mucous membranes are moist and pink without any thrush or ulcers. ?Eyes: Pupils are equal and round reactive to light. ?Lymph nodes: No cervical, supraclavicular, inguinal or axillary lymphadenopathy.   ?Heart:regular rate and rhythm.  S1 and S2 without leg edema. ?Lung: Clear without any rhonchi or wheezes.  No dullness to percussion. ?Abdomin: Soft, nontender, nondistended with good bowel sounds.  No hepatosplenomegaly. ?Musculoskeletal: No joint deformity or effusion.  Full range of motion noted. ?Neurological: No deficits noted on motor, sensory and deep tendon reflex exam. ?Skin: No petechial rash or dryness.  Appeared moist.  ? ? ? ? ? ? ? ? ? ? ? ? ? ? ? ? ? ?Lab Results: ?Lab Results  ?Component Value Date  ? WBC 5.2 08/17/2021  ? HGB 11.6 (L) 08/17/2021  ? HCT 35.0 (L) 08/17/2021  ? MCV 100.3 (H) 08/17/2021  ? PLT 188 08/17/2021  ? ?  Chemistry   ?   ?Component Value Date/Time  ? NA 138 08/17/2021 0835  ? NA 138 03/27/2017 0838  ? K 4.1 08/17/2021 0835  ? K 4.3 03/27/2017 0838  ? CL 105 08/17/2021 0835  ? CL 105 01/01/2013 0835  ? CO2 27 08/17/2021 0835  ? CO2 24 03/27/2017 0838  ? BUN 16 08/17/2021 0835  ? BUN 23.7 03/27/2017 0838  ? CREATININE 0.96 08/17/2021 0835  ? CREATININE 1.2 03/27/2017 0838  ?    ?Component Value Date/Time  ? CALCIUM 9.1 08/17/2021 0835  ? CALCIUM 9.9 03/27/2017 0838  ? ALKPHOS 91 08/17/2021 0835  ? ALKPHOS 79 03/27/2017 0838  ? AST 20 08/17/2021 0835  ? AST 25 03/27/2017 0838  ? ALT 31 08/17/2021 0835  ? ALT 26 03/27/2017 0838  ? BILITOT 0.6 08/17/2021 0835  ? BILITOT 0.31 03/27/2017 0838  ?  ? ? Latest Reference Range & Units 06/29/21 12:41 07/13/21 07:55 08/03/21 10:47  ?M Protein SerPl Elph-Mcnc Not Observed g/dL 0.5 (H) (C) 0.6 (H) (C) 0.5 (H) (C)  ?IFE 1   Comment ! (C) Comment ! (C) Comment ! (C)  ?Globulin, Total 2.2 - 3.9 g/dL 2.6 (C) 2.9 (C) 3.0 (C)  ?B-Globulin SerPl Elph-Mcnc 0.7 - 1.3 g/dL 0.9 (C) 1.0 (C) 1.1 (C)  ?IgG (Immunoglobin G), Serum 603 - 1,613 mg/dL 914 915 896  ?IgM (Immunoglobulin M), Srm 20 - 172 mg/dL 14 (L) 15 (L) 16 (L)  ?IgA 61 - 437 mg/dL 18 (L) 19 (L) 24 (L)  ? ? ? ? ?Impression and Plan: ? ?71 year old man with:  ? ?1.  IgG kappa multiple myeloma diagnosed in 2014.  He developed relapsed disease in 2022. ? ?The natural course of his disease was reviewed at this time and treatment options were discussed.  He continues to be on maintenance daratumumab without any complications.  Protein studies from August 11, 2021 continues to show decline in his M spike and overall asymptomatic disease.  At this time I recommended continued monthly maintenance and use different salvage therapy upon relapse.  He is agreeable to proceed. ? ? ?2.  Anemia: Related to plasma cell disorder and malignancy.  Hemoglobin continues to impression normal range.  His hemoglobin today is 12.5 and continues to improve with treatment. ? ? ?3.  IV access: He still has a Port-A-Cath in place without any issues. ? ? ?4.  Antiemetics: Compazine is available to him without any nausea or vomiting. ? ? ?5.  VZV prophylaxis: No reactivation noted at this time.  Continues to be on acyclovir. ? ? ?6. Followup: In 4 weeks for the next evaluation and daratumumab treatment. ? ? ?30  minutes were spent on this visit.  The time was dedicated to reviewing laboratory data, disease status update and future treatment options. ? ? ?Zola Button, MD ?3/6/20238:09 AM ?

## 2021-09-18 LAB — KAPPA/LAMBDA LIGHT CHAINS
Kappa free light chain: 20.1 mg/L — ABNORMAL HIGH (ref 3.3–19.4)
Kappa, lambda light chain ratio: 4.67 — ABNORMAL HIGH (ref 0.26–1.65)
Lambda free light chains: 4.3 mg/L — ABNORMAL LOW (ref 5.7–26.3)

## 2021-09-20 ENCOUNTER — Other Ambulatory Visit (HOSPITAL_COMMUNITY): Payer: Self-pay

## 2021-09-20 LAB — MULTIPLE MYELOMA PANEL, SERUM
Albumin SerPl Elph-Mcnc: 3.8 g/dL (ref 2.9–4.4)
Albumin/Glob SerPl: 1.4 (ref 0.7–1.7)
Alpha 1: 0.2 g/dL (ref 0.0–0.4)
Alpha2 Glob SerPl Elph-Mcnc: 0.8 g/dL (ref 0.4–1.0)
B-Globulin SerPl Elph-Mcnc: 0.9 g/dL (ref 0.7–1.3)
Gamma Glob SerPl Elph-Mcnc: 1 g/dL (ref 0.4–1.8)
Globulin, Total: 2.9 g/dL (ref 2.2–3.9)
IgA: 36 mg/dL — ABNORMAL LOW (ref 61–437)
IgG (Immunoglobin G), Serum: 1086 mg/dL (ref 603–1613)
IgM (Immunoglobulin M), Srm: 18 mg/dL — ABNORMAL LOW (ref 20–172)
M Protein SerPl Elph-Mcnc: 0.6 g/dL — ABNORMAL HIGH
Total Protein ELP: 6.7 g/dL (ref 6.0–8.5)

## 2021-09-27 ENCOUNTER — Other Ambulatory Visit (HOSPITAL_COMMUNITY): Payer: Self-pay

## 2021-09-27 MED ORDER — OXYCODONE-ACETAMINOPHEN 10-325 MG PO TABS
ORAL_TABLET | ORAL | 0 refills | Status: DC
  Filled 2021-10-03: qty 60, 30d supply, fill #0

## 2021-09-27 MED FILL — Acyclovir Tab 400 MG: ORAL | 90 days supply | Qty: 90 | Fill #0 | Status: AC

## 2021-10-01 ENCOUNTER — Other Ambulatory Visit (HOSPITAL_COMMUNITY): Payer: Self-pay

## 2021-10-03 ENCOUNTER — Other Ambulatory Visit (HOSPITAL_COMMUNITY): Payer: Self-pay

## 2021-10-12 ENCOUNTER — Telehealth: Payer: Self-pay | Admitting: Oncology

## 2021-10-12 NOTE — Telephone Encounter (Signed)
Called patient regarding upcoming appointments, patient is notified. 

## 2021-10-15 ENCOUNTER — Inpatient Hospital Stay: Payer: Medicare Other

## 2021-10-15 ENCOUNTER — Inpatient Hospital Stay (HOSPITAL_BASED_OUTPATIENT_CLINIC_OR_DEPARTMENT_OTHER): Payer: Medicare Other | Admitting: Oncology

## 2021-10-15 ENCOUNTER — Other Ambulatory Visit: Payer: Self-pay

## 2021-10-15 ENCOUNTER — Inpatient Hospital Stay: Payer: Medicare Other | Attending: Oncology

## 2021-10-15 VITALS — BP 161/88 | HR 60 | Temp 97.1°F | Resp 18 | Wt 158.3 lb

## 2021-10-15 DIAGNOSIS — C9001 Multiple myeloma in remission: Secondary | ICD-10-CM | POA: Diagnosis not present

## 2021-10-15 DIAGNOSIS — D649 Anemia, unspecified: Secondary | ICD-10-CM | POA: Diagnosis not present

## 2021-10-15 DIAGNOSIS — Z886 Allergy status to analgesic agent status: Secondary | ICD-10-CM | POA: Diagnosis not present

## 2021-10-15 DIAGNOSIS — Z95828 Presence of other vascular implants and grafts: Secondary | ICD-10-CM | POA: Diagnosis not present

## 2021-10-15 DIAGNOSIS — Z8546 Personal history of malignant neoplasm of prostate: Secondary | ICD-10-CM | POA: Insufficient documentation

## 2021-10-15 DIAGNOSIS — Z5112 Encounter for antineoplastic immunotherapy: Secondary | ICD-10-CM | POA: Diagnosis not present

## 2021-10-15 DIAGNOSIS — C9002 Multiple myeloma in relapse: Secondary | ICD-10-CM | POA: Insufficient documentation

## 2021-10-15 DIAGNOSIS — C61 Malignant neoplasm of prostate: Secondary | ICD-10-CM

## 2021-10-15 DIAGNOSIS — Z79899 Other long term (current) drug therapy: Secondary | ICD-10-CM | POA: Diagnosis not present

## 2021-10-15 DIAGNOSIS — C9000 Multiple myeloma not having achieved remission: Secondary | ICD-10-CM | POA: Diagnosis not present

## 2021-10-15 DIAGNOSIS — Z7969 Long term (current) use of other immunomodulators and immunosuppressants: Secondary | ICD-10-CM | POA: Insufficient documentation

## 2021-10-15 LAB — CBC WITH DIFFERENTIAL (CANCER CENTER ONLY)
Abs Immature Granulocytes: 0.01 10*3/uL (ref 0.00–0.07)
Basophils Absolute: 0 10*3/uL (ref 0.0–0.1)
Basophils Relative: 1 %
Eosinophils Absolute: 0 10*3/uL (ref 0.0–0.5)
Eosinophils Relative: 1 %
HCT: 36.9 % — ABNORMAL LOW (ref 39.0–52.0)
Hemoglobin: 12.4 g/dL — ABNORMAL LOW (ref 13.0–17.0)
Immature Granulocytes: 0 %
Lymphocytes Relative: 46 %
Lymphs Abs: 2.2 10*3/uL (ref 0.7–4.0)
MCH: 32 pg (ref 26.0–34.0)
MCHC: 33.6 g/dL (ref 30.0–36.0)
MCV: 95.1 fL (ref 80.0–100.0)
Monocytes Absolute: 0.3 10*3/uL (ref 0.1–1.0)
Monocytes Relative: 7 %
Neutro Abs: 2.1 10*3/uL (ref 1.7–7.7)
Neutrophils Relative %: 45 %
Platelet Count: 252 10*3/uL (ref 150–400)
RBC: 3.88 MIL/uL — ABNORMAL LOW (ref 4.22–5.81)
RDW: 11.9 % (ref 11.5–15.5)
WBC Count: 4.6 10*3/uL (ref 4.0–10.5)
nRBC: 0 % (ref 0.0–0.2)

## 2021-10-15 LAB — CMP (CANCER CENTER ONLY)
ALT: 41 U/L (ref 0–44)
AST: 19 U/L (ref 15–41)
Albumin: 4.2 g/dL (ref 3.5–5.0)
Alkaline Phosphatase: 101 U/L (ref 38–126)
Anion gap: 6 (ref 5–15)
BUN: 16 mg/dL (ref 8–23)
CO2: 27 mmol/L (ref 22–32)
Calcium: 9.4 mg/dL (ref 8.9–10.3)
Chloride: 103 mmol/L (ref 98–111)
Creatinine: 0.98 mg/dL (ref 0.61–1.24)
GFR, Estimated: >60 mL/min (ref >60)
Glucose, Bld: 132 mg/dL — ABNORMAL HIGH (ref 70–99)
Potassium: 4.4 mmol/L (ref 3.5–5.1)
Sodium: 136 mmol/L (ref 135–145)
Total Bilirubin: 0.3 mg/dL (ref 0.3–1.2)
Total Protein: 7.3 g/dL (ref 6.5–8.1)

## 2021-10-15 MED ORDER — DARATUMUMAB-HYALURONIDASE-FIHJ 1800-30000 MG-UT/15ML ~~LOC~~ SOLN
1800.0000 mg | Freq: Once | SUBCUTANEOUS | Status: AC
  Administered 2021-10-15: 1800 mg via SUBCUTANEOUS
  Filled 2021-10-15: qty 15

## 2021-10-15 MED ORDER — ACETAMINOPHEN 325 MG PO TABS
650.0000 mg | ORAL_TABLET | Freq: Once | ORAL | Status: AC
  Administered 2021-10-15: 650 mg via ORAL
  Filled 2021-10-15: qty 2

## 2021-10-15 MED ORDER — SODIUM CHLORIDE 0.9% FLUSH
10.0000 mL | INTRAVENOUS | Status: DC | PRN
  Administered 2021-10-15: 10 mL

## 2021-10-15 MED ORDER — DIPHENHYDRAMINE HCL 25 MG PO CAPS
50.0000 mg | ORAL_CAPSULE | Freq: Once | ORAL | Status: AC
  Administered 2021-10-15: 50 mg via ORAL
  Filled 2021-10-15: qty 2

## 2021-10-15 MED ORDER — DEXAMETHASONE 4 MG PO TABS
40.0000 mg | ORAL_TABLET | Freq: Once | ORAL | Status: AC
  Administered 2021-10-15: 40 mg via ORAL
  Filled 2021-10-15: qty 10

## 2021-10-15 MED ORDER — HEPARIN SOD (PORK) LOCK FLUSH 100 UNIT/ML IV SOLN
500.0000 [IU] | Freq: Once | INTRAVENOUS | Status: AC | PRN
  Administered 2021-10-15: 500 [IU] via INTRAVENOUS

## 2021-10-15 NOTE — Patient Instructions (Signed)
[facility]  ? Discharge Instructions: ?Thank you for choosing [facility] to provide your oncology and hematology care.  ? ?If you have a lab appointment with the [facility], please go directly to the [facility] and check in at the registration area. ?  ?Wear comfortable clothing and clothing appropriate for easy access to any Portacath or PICC line.  ? ?We strive to give you quality time with your provider. You may need to reschedule your appointment if you arrive late (15 or more minutes).  Arriving late affects you and other patients whose appointments are after yours.  Also, if you miss three or more appointments without notifying the office, you may be dismissed from the clinic at the provider?s discretion.    ?  ?For prescription refill requests, have your pharmacy contact our office and allow 72 hours for refills to be completed.   ? ?Today you received the following chemotherapy and/or immunotherapy agents: Daratumumab hyaluronidase (Darzalex Faspro)    ?  ?To help prevent nausea and vomiting after your treatment, we encourage you to take your nausea medication as directed. ? ?BELOW ARE SYMPTOMS THAT SHOULD BE REPORTED IMMEDIATELY: ?*FEVER GREATER THAN 100.4 F (38 ?C) OR HIGHER ?*CHILLS OR SWEATING ?*NAUSEA AND VOMITING THAT IS NOT CONTROLLED WITH YOUR NAUSEA MEDICATION ?*UNUSUAL SHORTNESS OF BREATH ?*UNUSUAL BRUISING OR BLEEDING ?*URINARY PROBLEMS (pain or burning when urinating, or frequent urination) ?*BOWEL PROBLEMS (unusual diarrhea, constipation, pain near the anus) ?TENDERNESS IN MOUTH AND THROAT WITH OR WITHOUT PRESENCE OF ULCERS (sore throat, sores in mouth, or a toothache) ?UNUSUAL RASH, SWELLING OR PAIN  ?UNUSUAL VAGINAL DISCHARGE OR ITCHING  ? ?Items with * indicate a potential emergency and should be followed up as soon as possible or go to the Emergency Department if any problems should occur. ? ?Please show the CHEMOTHERAPY ALERT CARD or  IMMUNOTHERAPY ALERT CARD at check-in to the Emergency Department and triage nurse. ? ?Should you have questions after your visit or need to cancel or reschedule your appointment, please contact [facility]  Dept: [phone]  and follow the prompts.  Office hours are 8:00 a.m. to 4:30 p.m. Monday - Friday. Please note that voicemails left after 4:00 p.m. may not be returned until the following business day.  We are closed weekends and major holidays. You have access to a nurse at all times for urgent questions. Please call the main number to the clinic Dept: [phone] and follow the prompts. ? ? ?For any non-urgent questions, you may also contact your provider using MyChart. We now offer e-Visits for anyone 68 and older to request care online for non-urgent symptoms. For details visit [email or web address]. ?  ?Also download the MyChart app! Go to the app store, search "MyChart", open the app, select [facility], and log in with your MyChart username and password. ? ?Due to Covid, a mask is required upon entering the hospital/clinic. If you do not have a mask, one will be given to you upon arrival. For doctor visits, patients may have 1 support person aged 38 or older with them. For treatment visits, patients cannot have anyone with them due to current Covid guidelines and our immunocompromised population.  ? ?

## 2021-10-15 NOTE — Progress Notes (Signed)
Hematology and Oncology Follow Up Visit ? ?Travis Nguyen ?[id number] ?09/10/1950 71 y.o. ?10/15/2021 8:01 AM ?Kathline Magic, MD  ? ?Principle Diagnosis: 71 year old man with IgG kappa multiple myeloma diagnosed in 2014.  He developed relapsed disease in 2022. ?Secondary diagnosis: Stage a T1c, Gleason score 3+4 = 7 prostate cancer that is currently in remission. ? ?Prior Therapy: ?He was treated initially with Cytoxan, Velcade and Decadron and subsequently his regimen changed to a Velcade, Revlimid with dexamethasone.  He achieved remission at that time. ? ? ?He is status post a robotic-assisted laparoscopic radical prostatectomy and bilateral lymph node dissection on 02/09/2014. The final pathology showed prostate adenocarcinoma Gleason score 4+3 equals 7 involving both lobes.  ? ? ?Carfilzomib, and dexamethasone, daratumumab started on July 28, 2020.  Dexamethasone and carfilzomib is weekly with daratumumab every 2 weeks.  Therapy concluded in December 2022. ? ?Current therapy: He is currently receiving Darzalex Faspro on a monthly maintenance started in January 2023. ? ?Interim History:  Mr. Vaca returns today for repeat follow-up.  Since last visit, he reports no major changes in his health.  He denies any recent hospitalizations or illnesses.  Denies any nausea, vomiting or abdominal pain.  He denies any bone pain or pathological fractures.  He remains active and attends to activities of daily living.  He denies any GI toxicity. ? ? ? ?Medications: Reviewed without changes ?Current Outpatient Medications  ?Medication Sig Dispense Refill  ? acyclovir (ZOVIRAX) 400 MG tablet TAKE 1 TABLET BY MOUTH DAILY. (Patient not taking: Reported on 12/14/2020) 90 tablet 3  ? amLODipine (NORVASC) 10 MG tablet Take 1 tablet by mouth once a day (Patient taking differently: Take 10 mg by mouth daily.) 90 tablet 1  ? amLODipine (NORVASC) 5 MG tablet Take 1 tablet by mouth once a day 90 tablet 1  ?  doxycycline (VIBRAMYCIN) 100 MG capsule Take 1 capsule by mouth 2 times daily. 20 capsule 0  ? escitalopram (LEXAPRO) 10 MG tablet Take 1 tablet by mouth once a day (Patient taking differently: Take 10 mg by mouth daily.) 90 tablet 1  ? escitalopram (LEXAPRO) 10 MG tablet Take 1 tablet by mouth once daily 90 tablet 1  ? lidocaine-prilocaine (EMLA) cream APPLY TOPICALLY TO PORT-A-CATH DAILY AS NEEDED (Patient taking differently: 1 application daily as needed (port).) 30 g 2  ? losartan (COZAAR) 100 MG tablet TAKE 1 TABLET BY MOUTH ONCE A DAY (Patient not taking: Reported on 07/17/2021) 90 tablet 0  ? losartan (COZAAR) 100 MG tablet Take 1 tablet by mouth once a day (Patient taking differently: Take 100 mg by mouth daily.) 90 tablet 1  ? losartan (COZAAR) 100 MG tablet Take 1 tablet by mouth daily 90 tablet 1  ? oxyCODONE-acetaminophen (PERCOCET) 10-325 MG tablet Take 1 tablet by mouth 2 times a day (Patient taking differently: Take 1 tablet by mouth 2 (two) times daily as needed for pain.) 60 tablet 0  ? oxyCODONE-acetaminophen (PERCOCET) 10-325 MG tablet Take 1 tablet by mouth twice daily as needed (07/05/21) (Patient not taking: Reported on 07/17/2021) 60 tablet 0  ? oxyCODONE-acetaminophen (PERCOCET) 10-325 MG tablet Take 1 tablet by mouth 2 (two) times daily as needed. 60 tablet 0  ? oxyCODONE-acetaminophen (PERCOCET) 10-325 MG tablet Take 1 tablet by mouth twice daily as needed (30 days)...ok to fill 30 days after last refill 60 tablet 0  ? pantoprazole (PROTONIX) 20 MG tablet Take 1 tablet (20 mg total) by mouth daily. (Patient not taking: Reported on  07/17/2021) 14 tablet 0  ? vitamin C (ASCORBIC ACID) 500 MG tablet Take 500 mg by mouth daily.    ? ?No current facility-administered medications for this visit.  ? ?Facility-Administered Medications Ordered in Other Visits  ?Medication Dose Route Frequency Provider Last Rate Last Admin  ? sodium chloride flush (NS) 0.9 % injection 10 mL  10 mL Intracatheter PRN Wyatt Portela, MD      ? ? ? ?Allergies:  ?Allergies  ?Allergen Reactions  ? Aspirin   ?  Pt stated "raises BP"   ? ? ? ? ? ?Physical Exam: ? ? ? ? ? ?Blood pressure (!) 161/88, pulse 60, temperature (!) 97.1 ?F (36.2 ?C), temperature source Tympanic, resp. rate 18, weight 158 lb 5 oz (71.8 kg), SpO2 100 %. ? ? ? ? ? ? ? ? ?ECOG: 1 ? ? ? ?General appearance: Alert, awake without any distress. ?Head: Atraumatic without abnormalities ?Oropharynx: Without any thrush or ulcers. ?Eyes: No scleral icterus. ?Lymph nodes: No lymphadenopathy noted in the cervical, supraclavicular, or axillary nodes ?Heart:regular rate and rhythm, without any murmurs or gallops.   ?Lung: Clear to auscultation without any rhonchi, wheezes or dullness to percussion. ?Abdomin: Soft, nontender without any shifting dullness or ascites. ?Musculoskeletal: No clubbing or cyanosis. ?Neurological: No motor or sensory deficits. ?Skin: No rashes or lesions. ? ? ? ? ? ? ? ? ? ? ? ? ? ? ? ? ? ? ?Lab Results: ?Lab Results  ?Component Value Date  ? WBC 4.7 09/17/2021  ? HGB 12.5 (L) 09/17/2021  ? HCT 35.5 (L) 09/17/2021  ? MCV 96.5 09/17/2021  ? PLT 191 09/17/2021  ? ?  Chemistry   ?   ?Component Value Date/Time  ? NA 137 09/17/2021 0822  ? NA 138 03/27/2017 0838  ? K 4.5 09/17/2021 3086  ? K 4.3 03/27/2017 0838  ? CL 104 09/17/2021 0822  ? CL 105 01/01/2013 0835  ? CO2 27 09/17/2021 0822  ? CO2 24 03/27/2017 0838  ? BUN 13 09/17/2021 0822  ? BUN 23.7 03/27/2017 0838  ? CREATININE 0.91 09/17/2021 0822  ? CREATININE 1.2 03/27/2017 0838  ?    ?Component Value Date/Time  ? CALCIUM 9.4 09/17/2021 0822  ? CALCIUM 9.9 03/27/2017 0838  ? ALKPHOS 83 09/17/2021 0822  ? ALKPHOS 79 03/27/2017 0838  ? AST 17 09/17/2021 0822  ? AST 25 03/27/2017 0838  ? ALT 15 09/17/2021 0822  ? ALT 26 03/27/2017 0838  ? BILITOT 0.5 09/17/2021 5784  ? BILITOT 0.31 03/27/2017 0838  ?  ? ? ? Latest Reference Range & Units 05/25/21 08:21 06/29/21 12:41 07/13/21 07:55 08/03/21 10:47 09/17/21 08:22   ?M Protein SerPl Elph-Mcnc Not Observed g/dL Not Observed (C) 0.5 (H) (C) 0.6 (H) (C) 0.5 (H) (C) 0.6 (H) (C)  ?IFE 1  Comment (C) Comment ! (C) Comment ! (C) Comment ! (C) Comment ! (C)  ?Globulin, Total 2.2 - 3.9 g/dL 2.6 (C) 2.6 (C) 2.9 (C) 3.0 (C) 2.9 (C)  ?B-Globulin SerPl Elph-Mcnc 0.7 - 1.3 g/dL 0.9 (C) 0.9 (C) 1.0 (C) 1.1 (C) 0.9 (C)  ?IgG (Immunoglobin G), Serum 603 - 1,613 mg/dL 872 914 915 896 1,086  ?IgM (Immunoglobulin M), Srm 20 - 172 mg/dL 54 14 (L) 15 (L) 16 (L) 18 (L)  ?IgA 61 - 437 mg/dL 68 18 (L) 19 (L) 24 (L) 36 (L)  ?(H): Data is abnormally high ?!: Data is abnormal ?(L): Data is abnormally low ?(C): Corrected ? ? ?  Impression and Plan: ? ?71 year old man with:  ? ?1.  Multiple myeloma diagnosed with relapsed disease in 2022.  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 ? ?He remains on maintenance daratumumab without any major complications.  Risks and benefits of continuing this treatment versus use of different salvage therapy were reiterated.  Protein studies continues to show reasonable response without any evidence of endorgan damage.  He is agreeable to continue.  ? ? ?2.  Anemia: Resolved with hemoglobin normalizing at this time.  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 ? ? ?3.  IV access: No complications related to his Port-A-Cath at this time. ? ? ?4.  Antiemetics: No nausea or vomiting reported at this time.  Compazine is available to him. ? ? ?5.  VZV prophylaxis: I recommended continuing acyclovir for this time.  Reactivation noted. ? ? ?6. Followup: He will return next month for the next cycle of therapy. ? ? ?30  minutes were dedicated to this encounter.  The time was spent on reviewing his disease status, treatment choices and addressing complications related to his cancer and cancer therapy. ? ? ?Zola Button, MD ?4/3/20238:01 AM ?

## 2021-10-15 NOTE — Progress Notes (Signed)
Per  [name]. Ok to change treatment parameters for to CBC results and VS only prior to releasing Darzalex Faspro.  ?

## 2021-10-16 LAB — KAPPA/LAMBDA LIGHT CHAINS
Kappa free light chain: 19.8 mg/L — ABNORMAL HIGH (ref 3.3–19.4)
Kappa, lambda light chain ratio: 5.21 — ABNORMAL HIGH (ref 0.26–1.65)
Lambda free light chains: 3.8 mg/L — ABNORMAL LOW (ref 5.7–26.3)

## 2021-10-17 LAB — MULTIPLE MYELOMA PANEL, SERUM
Albumin SerPl Elph-Mcnc: 3.9 g/dL (ref 2.9–4.4)
Albumin/Glob SerPl: 1.4 (ref 0.7–1.7)
Alpha 1: 0.2 g/dL (ref 0.0–0.4)
Alpha2 Glob SerPl Elph-Mcnc: 0.9 g/dL (ref 0.4–1.0)
B-Globulin SerPl Elph-Mcnc: 0.8 g/dL (ref 0.7–1.3)
Gamma Glob SerPl Elph-Mcnc: 1 g/dL (ref 0.4–1.8)
Globulin, Total: 2.9 g/dL (ref 2.2–3.9)
IgA: 42 mg/dL — ABNORMAL LOW (ref 61–437)
IgG (Immunoglobin G), Serum: 1092 mg/dL (ref 603–1613)
IgM (Immunoglobulin M), Srm: 31 mg/dL (ref 20–172)
M Protein SerPl Elph-Mcnc: 0.7 g/dL — ABNORMAL HIGH
Total Protein ELP: 6.8 g/dL (ref 6.0–8.5)

## 2021-10-30 ENCOUNTER — Other Ambulatory Visit (HOSPITAL_COMMUNITY): Payer: Self-pay

## 2021-10-30 MED ORDER — OXYCODONE-ACETAMINOPHEN 10-325 MG PO TABS
ORAL_TABLET | ORAL | 0 refills | Status: DC
  Filled 2021-10-30 – 2021-11-02 (×2): qty 60, 30d supply, fill #0

## 2021-10-31 ENCOUNTER — Other Ambulatory Visit (HOSPITAL_COMMUNITY): Payer: Self-pay

## 2021-11-02 ENCOUNTER — Other Ambulatory Visit (HOSPITAL_COMMUNITY): Payer: Self-pay

## 2021-11-12 ENCOUNTER — Inpatient Hospital Stay (HOSPITAL_BASED_OUTPATIENT_CLINIC_OR_DEPARTMENT_OTHER): Payer: Medicare Other | Admitting: Oncology

## 2021-11-12 ENCOUNTER — Inpatient Hospital Stay: Payer: Medicare Other

## 2021-11-12 ENCOUNTER — Inpatient Hospital Stay: Payer: Medicare Other | Attending: Oncology

## 2021-11-12 ENCOUNTER — Other Ambulatory Visit: Payer: Self-pay

## 2021-11-12 VITALS — BP 160/87 | HR 87 | Temp 97.5°F | Resp 18 | Wt 159.9 lb

## 2021-11-12 DIAGNOSIS — Z79899 Other long term (current) drug therapy: Secondary | ICD-10-CM | POA: Diagnosis not present

## 2021-11-12 DIAGNOSIS — C9001 Multiple myeloma in remission: Secondary | ICD-10-CM | POA: Diagnosis not present

## 2021-11-12 DIAGNOSIS — Z7969 Long term (current) use of other immunomodulators and immunosuppressants: Secondary | ICD-10-CM | POA: Diagnosis not present

## 2021-11-12 DIAGNOSIS — C9002 Multiple myeloma in relapse: Secondary | ICD-10-CM | POA: Diagnosis not present

## 2021-11-12 DIAGNOSIS — C61 Malignant neoplasm of prostate: Secondary | ICD-10-CM | POA: Insufficient documentation

## 2021-11-12 DIAGNOSIS — D649 Anemia, unspecified: Secondary | ICD-10-CM | POA: Insufficient documentation

## 2021-11-12 DIAGNOSIS — Z5112 Encounter for antineoplastic immunotherapy: Secondary | ICD-10-CM | POA: Diagnosis not present

## 2021-11-12 DIAGNOSIS — Z886 Allergy status to analgesic agent status: Secondary | ICD-10-CM | POA: Diagnosis not present

## 2021-11-12 DIAGNOSIS — Z95828 Presence of other vascular implants and grafts: Secondary | ICD-10-CM

## 2021-11-12 LAB — CBC WITH DIFFERENTIAL (CANCER CENTER ONLY)
Abs Immature Granulocytes: 0 10*3/uL (ref 0.00–0.07)
Basophils Absolute: 0 10*3/uL (ref 0.0–0.1)
Basophils Relative: 1 %
Eosinophils Absolute: 0 10*3/uL (ref 0.0–0.5)
Eosinophils Relative: 1 %
HCT: 36.3 % — ABNORMAL LOW (ref 39.0–52.0)
Hemoglobin: 12.2 g/dL — ABNORMAL LOW (ref 13.0–17.0)
Immature Granulocytes: 0 %
Lymphocytes Relative: 38 %
Lymphs Abs: 1.5 10*3/uL (ref 0.7–4.0)
MCH: 31.4 pg (ref 26.0–34.0)
MCHC: 33.6 g/dL (ref 30.0–36.0)
MCV: 93.3 fL (ref 80.0–100.0)
Monocytes Absolute: 0.3 10*3/uL (ref 0.1–1.0)
Monocytes Relative: 7 %
Neutro Abs: 2.2 10*3/uL (ref 1.7–7.7)
Neutrophils Relative %: 53 %
Platelet Count: 159 10*3/uL (ref 150–400)
RBC: 3.89 MIL/uL — ABNORMAL LOW (ref 4.22–5.81)
RDW: 12.7 % (ref 11.5–15.5)
WBC Count: 4 10*3/uL (ref 4.0–10.5)
nRBC: 0 % (ref 0.0–0.2)

## 2021-11-12 LAB — CMP (CANCER CENTER ONLY)
ALT: 20 U/L (ref 0–44)
AST: 21 U/L (ref 15–41)
Albumin: 4.2 g/dL (ref 3.5–5.0)
Alkaline Phosphatase: 86 U/L (ref 38–126)
Anion gap: 4 — ABNORMAL LOW (ref 5–15)
BUN: 18 mg/dL (ref 8–23)
CO2: 27 mmol/L (ref 22–32)
Calcium: 9.1 mg/dL (ref 8.9–10.3)
Chloride: 106 mmol/L (ref 98–111)
Creatinine: 1.03 mg/dL (ref 0.61–1.24)
GFR, Estimated: >60 mL/min (ref >60)
Glucose, Bld: 121 mg/dL — ABNORMAL HIGH (ref 70–99)
Potassium: 4.1 mmol/L (ref 3.5–5.1)
Sodium: 137 mmol/L (ref 135–145)
Total Bilirubin: 0.3 mg/dL (ref 0.3–1.2)
Total Protein: 6.9 g/dL (ref 6.5–8.1)

## 2021-11-12 MED ORDER — DIPHENHYDRAMINE HCL 25 MG PO CAPS
50.0000 mg | ORAL_CAPSULE | Freq: Once | ORAL | Status: AC
  Administered 2021-11-12: 50 mg via ORAL
  Filled 2021-11-12: qty 2

## 2021-11-12 MED ORDER — DARATUMUMAB-HYALURONIDASE-FIHJ 1800-30000 MG-UT/15ML ~~LOC~~ SOLN
1800.0000 mg | Freq: Once | SUBCUTANEOUS | Status: AC
  Administered 2021-11-12: 1800 mg via SUBCUTANEOUS
  Filled 2021-11-12: qty 15

## 2021-11-12 MED ORDER — DEXAMETHASONE 4 MG PO TABS
40.0000 mg | ORAL_TABLET | Freq: Once | ORAL | Status: AC
  Administered 2021-11-12: 40 mg via ORAL
  Filled 2021-11-12: qty 10

## 2021-11-12 MED ORDER — SODIUM CHLORIDE 0.9% FLUSH
10.0000 mL | INTRAVENOUS | Status: DC | PRN
  Administered 2021-11-12: 10 mL

## 2021-11-12 MED ORDER — ACETAMINOPHEN 325 MG PO TABS
650.0000 mg | ORAL_TABLET | Freq: Once | ORAL | Status: AC
  Administered 2021-11-12: 650 mg via ORAL
  Filled 2021-11-12: qty 2

## 2021-11-12 MED ORDER — HEPARIN SOD (PORK) LOCK FLUSH 100 UNIT/ML IV SOLN
500.0000 [IU] | Freq: Once | INTRAVENOUS | Status: AC | PRN
  Administered 2021-11-12: 500 [IU]

## 2021-11-12 NOTE — Progress Notes (Signed)
Hematology and Oncology Follow Up Visit ? ?Travis Nguyen ?[id number] ?May 22, 1951 71 y.o. ?11/12/2021 8:10 AM ?Kathline Magic, MD  ? ?Principle Diagnosis: 71 year old man with multiple myeloma diagnosed in 2014.  He at IgG kappa which relapsed in 2022. ? ? ?Secondary diagnosis: Stage a T1c, Gleason score 3+4 = 7 prostate cancer that is currently in remission. ? ?Prior Therapy: ?He was treated initially with Cytoxan, Velcade and Decadron and subsequently his regimen changed to a Velcade, Revlimid with dexamethasone.  He achieved remission at that time. ? ? ?He is status post a robotic-assisted laparoscopic radical prostatectomy and bilateral lymph node dissection on 02/09/2014. The final pathology showed prostate adenocarcinoma Gleason score 4+3 equals 7 involving both lobes.  ? ? ?Carfilzomib, and dexamethasone, daratumumab started on July 28, 2020.  Dexamethasone and carfilzomib is weekly with daratumumab every 2 weeks.  Therapy concluded in December 2022. ? ?Current therapy: He is currently receiving Darzalex Faspro on a monthly maintenance started in January 2023. ? ?Interim History:  Travis Nguyen is here for repeat evaluation.  Since the last visit, he reports no major changes in his health.  He continues to tolerate the current treatment without any complaints.  He denies any nausea, vomiting or abdominal pain.  He denies any hospitalizations or illnesses.  He denies any bone pain or pathological fractures.  He has gained weight and continues to attempt activities of daily living. ? ? ? ?Medications: Updated on review. ?Current Outpatient Medications  ?Medication Sig Dispense Refill  ? acyclovir (ZOVIRAX) 400 MG tablet TAKE 1 TABLET BY MOUTH DAILY. (Patient not taking: Reported on 12/14/2020) 90 tablet 3  ? amLODipine (NORVASC) 10 MG tablet Take 1 tablet by mouth once a day (Patient taking differently: Take 10 mg by mouth daily.) 90 tablet 1  ? amLODipine (NORVASC) 5 MG tablet Take 1 tablet by  mouth once a day 90 tablet 1  ? doxycycline (VIBRAMYCIN) 100 MG capsule Take 1 capsule by mouth 2 times daily. 20 capsule 0  ? escitalopram (LEXAPRO) 10 MG tablet Take 1 tablet by mouth once a day (Patient taking differently: Take 10 mg by mouth daily.) 90 tablet 1  ? escitalopram (LEXAPRO) 10 MG tablet Take 1 tablet by mouth once daily 90 tablet 1  ? lidocaine-prilocaine (EMLA) cream APPLY TOPICALLY TO PORT-A-CATH DAILY AS NEEDED (Patient taking differently: 1 application daily as needed (port).) 30 g 2  ? losartan (COZAAR) 100 MG tablet TAKE 1 TABLET BY MOUTH ONCE A DAY (Patient not taking: Reported on 07/17/2021) 90 tablet 0  ? losartan (COZAAR) 100 MG tablet Take 1 tablet by mouth once a day (Patient taking differently: Take 100 mg by mouth daily.) 90 tablet 1  ? losartan (COZAAR) 100 MG tablet Take 1 tablet by mouth daily 90 tablet 1  ? oxyCODONE-acetaminophen (PERCOCET) 10-325 MG tablet Take 1 tablet by mouth 2 times a day (Patient taking differently: Take 1 tablet by mouth 2 (two) times daily as needed for pain.) 60 tablet 0  ? oxyCODONE-acetaminophen (PERCOCET) 10-325 MG tablet Take 1 tablet by mouth twice daily as needed (07/05/21) (Patient not taking: Reported on 07/17/2021) 60 tablet 0  ? oxyCODONE-acetaminophen (PERCOCET) 10-325 MG tablet Take 1 tablet by mouth 2 (two) times daily as needed. 60 tablet 0  ? oxyCODONE-acetaminophen (PERCOCET) 10-325 MG tablet Take 1 tablet by mouth twice daily as needed ...ok to fill 30 days after last refill 60 tablet 0  ? pantoprazole (PROTONIX) 20 MG tablet Take 1 tablet (20 mg  total) by mouth daily. (Patient not taking: Reported on 07/17/2021) 14 tablet 0  ? vitamin C (ASCORBIC ACID) 500 MG tablet Take 500 mg by mouth daily.    ? ?No current facility-administered medications for this visit.  ? ? ? ?Allergies:  ?Allergies  ?Allergen Reactions  ? Aspirin   ?  Pt stated "raises BP"   ? ? ? ? ? ?Physical Exam: ? ? ? ? ? ? ?Blood pressure (!) 160/87, pulse 87, temperature (!)  97.5 ?F (36.4 ?C), resp. rate 18, weight 159 lb 14.4 oz (72.5 kg), SpO2 100 %. ? ? ? ? ? ? ? ? ?ECOG: 1 ? ? ?General appearance: Comfortable appearing without any discomfort ?Head: Normocephalic without any trauma ?Oropharynx: Mucous membranes are moist and pink without any thrush or ulcers. ?Eyes: Pupils are equal and round reactive to light. ?Lymph nodes: No cervical, supraclavicular, inguinal or axillary lymphadenopathy.   ?Heart:regular rate and rhythm.  S1 and S2 without leg edema. ?Lung: Clear without any rhonchi or wheezes.  No dullness to percussion. ?Abdomin: Soft, nontender, nondistended with good bowel sounds.  No hepatosplenomegaly. ?Musculoskeletal: No joint deformity or effusion.  Full range of motion noted. ?Neurological: No deficits noted on motor, sensory and deep tendon reflex exam. ?Skin: No petechial rash or dryness.  Appeared moist.  ? ? ? ? ? ? ? ? ? ? ? ? ? ? ? ? ? ? ?Lab Results: ?Lab Results  ?Component Value Date  ? WBC 4.6 10/15/2021  ? HGB 12.4 (L) 10/15/2021  ? HCT 36.9 (L) 10/15/2021  ? MCV 95.1 10/15/2021  ? PLT 252 10/15/2021  ? ?  Chemistry   ?   ?Component Value Date/Time  ? NA 136 10/15/2021 0804  ? NA 138 03/27/2017 0838  ? K 4.4 10/15/2021 0804  ? K 4.3 03/27/2017 0838  ? CL 103 10/15/2021 0804  ? CL 105 01/01/2013 0835  ? CO2 27 10/15/2021 0804  ? CO2 24 03/27/2017 0838  ? BUN 16 10/15/2021 0804  ? BUN 23.7 03/27/2017 0838  ? CREATININE 0.98 10/15/2021 0804  ? CREATININE 1.2 03/27/2017 0838  ?    ?Component Value Date/Time  ? CALCIUM 9.4 10/15/2021 0804  ? CALCIUM 9.9 03/27/2017 0838  ? ALKPHOS 101 10/15/2021 0804  ? ALKPHOS 79 03/27/2017 0838  ? AST 19 10/15/2021 0804  ? AST 25 03/27/2017 0838  ? ALT 41 10/15/2021 0804  ? ALT 26 03/27/2017 0838  ? BILITOT 0.3 10/15/2021 0804  ? BILITOT 0.31 03/27/2017 0838  ?  ? ? Latest Reference Range & Units 08/03/21 10:47 09/17/21 08:22 10/15/21 08:04  ?M Protein SerPl Elph-Mcnc Not Observed g/dL 0.5 (H) (C) 0.6 (H) (C) 0.7 (H) (C)  ?IFE 1   Comment ! (C) Comment ! (C) Comment ! (C)  ?Globulin, Total 2.2 - 3.9 g/dL 3.0 (C) 2.9 (C) 2.9 (C)  ?B-Globulin SerPl Elph-Mcnc 0.7 - 1.3 g/dL 1.1 (C) 0.9 (C) 0.8 (C)  ?IgG (Immunoglobin G), Serum 603 - 1,613 mg/dL 896 1,086 1,092  ?IgM (Immunoglobulin M), Srm 20 - 172 mg/dL 16 (L) 18 (L) 31  ?IgA 61 - 437 mg/dL 24 (L) 36 (L) 42 (L)  ? ? ? Latest Reference Range & Units 08/03/21 10:44 09/17/21 08:22 10/15/21 08:04  ?Kappa free light chain 3.3 - 19.4 mg/L 17.2 20.1 (H) 19.8 (H)  ?Lambda free light chains 5.7 - 26.3 mg/L 3.6 (L) 4.3 (L) 3.8 (L)  ?Kappa, lambda light chain ratio 0.26 - 1.65  4.78 (H) 4.67 (  H) 5.21 (H)  ?(H): Data is abnormally high ?(L): Data is abnormally low ? ? ?Impression and Plan: ? ?71 year old man with:  ? ?1.  IgG kappa multiple myeloma with relapsed disease in 2022 after initial diagnosis in 2014. ? ?He has tolerated maintenance therapy without any major complications.  Risks and benefits of continuing this treatment were reviewed at this time.  Protein studies obtained in April 2023 showed mild increase in his M spike but otherwise his disease is under excellent control.  He has no evidence of endorgan damage at this time.  I recommended continued maintenance treatment and restart salvage therapy if he has symptomatic progression.  He salvage therapy options including restarting Kyprolis or Pomalyst ? ?2.  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 ? ? ?3.  IV access: Port-A-Cath remains in place without any issues. ? ? ?4.  Antiemetics: Compazine is available to him without any nausea or vomiting. ? ? ?5.  VZV prophylaxis: No reactivation noted at this time.  He continues to be on acyclovir. ? ?6. Followup: He will return in 4 weeks for repeat evaluation. ? ? ?30  minutes were spent on this visit.  The time was dedicated to reviewing laboratory data, disease status update and outlining future plan of care discussion. ? ? ?Zola Button,  MD ?5/1/20238:10 AM ?

## 2021-11-12 NOTE — Patient Instructions (Signed)
[facility]  ? Discharge Instructions: ?Thank you for choosing [facility] to provide your oncology and hematology care.  ? ?If you have a lab appointment with the [facility], please go directly to the [facility] and check in at the registration area. ?  ?Wear comfortable clothing and clothing appropriate for easy access to any Portacath or PICC line.  ? ?We strive to give you quality time with your provider. You may need to reschedule your appointment if you arrive late (15 or more minutes).  Arriving late affects you and other patients whose appointments are after yours.  Also, if you miss three or more appointments without notifying the office, you may be dismissed from the clinic at the provider?s discretion.    ?  ?For prescription refill requests, have your pharmacy contact our office and allow 72 hours for refills to be completed.   ? ?Today you received the following chemotherapy and/or immunotherapy agents: Daratumumab hyaluronidase (Darzalex Faspro)    ?  ?To help prevent nausea and vomiting after your treatment, we encourage you to take your nausea medication as directed. ? ?BELOW ARE SYMPTOMS THAT SHOULD BE REPORTED IMMEDIATELY: ?*FEVER GREATER THAN 100.4 F (38 ?C) OR HIGHER ?*CHILLS OR SWEATING ?*NAUSEA AND VOMITING THAT IS NOT CONTROLLED WITH YOUR NAUSEA MEDICATION ?*UNUSUAL SHORTNESS OF BREATH ?*UNUSUAL BRUISING OR BLEEDING ?*URINARY PROBLEMS (pain or burning when urinating, or frequent urination) ?*BOWEL PROBLEMS (unusual diarrhea, constipation, pain near the anus) ?TENDERNESS IN MOUTH AND THROAT WITH OR WITHOUT PRESENCE OF ULCERS (sore throat, sores in mouth, or a toothache) ?UNUSUAL RASH, SWELLING OR PAIN  ?UNUSUAL VAGINAL DISCHARGE OR ITCHING  ? ?Items with * indicate a potential emergency and should be followed up as soon as possible or go to the Emergency Department if any problems should occur. ? ?Please show the CHEMOTHERAPY ALERT CARD or  IMMUNOTHERAPY ALERT CARD at check-in to the Emergency Department and triage nurse. ? ?Should you have questions after your visit or need to cancel or reschedule your appointment, please contact [facility]  Dept: [phone]  and follow the prompts.  Office hours are 8:00 a.m. to 4:30 p.m. Monday - Friday. Please note that voicemails left after 4:00 p.m. may not be returned until the following business day.  We are closed weekends and major holidays. You have access to a nurse at all times for urgent questions. Please call the main number to the clinic Dept: [phone] and follow the prompts. ? ? ?For any non-urgent questions, you may also contact your provider using MyChart. We now offer e-Visits for anyone 21 and older to request care online for non-urgent symptoms. For details visit [email or web address]. ?  ?Also download the MyChart app! Go to the app store, search "MyChart", open the app, select [facility], and log in with your MyChart username and password. ? ?Due to Covid, a mask is required upon entering the hospital/clinic. If you do not have a mask, one will be given to you upon arrival. For doctor visits, patients may have 1 support Delshawn Stech aged 9 or older with them. For treatment visits, patients cannot have anyone with them due to current Covid guidelines and our immunocompromised population.  ? ?

## 2021-11-13 LAB — KAPPA/LAMBDA LIGHT CHAINS
Kappa free light chain: 19.8 mg/L — ABNORMAL HIGH (ref 3.3–19.4)
Kappa, lambda light chain ratio: 6.6 — ABNORMAL HIGH (ref 0.26–1.65)
Lambda free light chains: 3 mg/L — ABNORMAL LOW (ref 5.7–26.3)

## 2021-11-15 LAB — MULTIPLE MYELOMA PANEL, SERUM
Albumin SerPl Elph-Mcnc: 3.9 g/dL (ref 2.9–4.4)
Albumin/Glob SerPl: 1.6 (ref 0.7–1.7)
Alpha 1: 0.2 g/dL (ref 0.0–0.4)
Alpha2 Glob SerPl Elph-Mcnc: 0.7 g/dL (ref 0.4–1.0)
B-Globulin SerPl Elph-Mcnc: 0.8 g/dL (ref 0.7–1.3)
Gamma Glob SerPl Elph-Mcnc: 1 g/dL (ref 0.4–1.8)
Globulin, Total: 2.6 g/dL (ref 2.2–3.9)
IgA: 27 mg/dL — ABNORMAL LOW (ref 61–437)
IgG (Immunoglobin G), Serum: 1116 mg/dL (ref 603–1613)
IgM (Immunoglobulin M), Srm: 26 mg/dL (ref 20–172)
M Protein SerPl Elph-Mcnc: 0.7 g/dL — ABNORMAL HIGH
Total Protein ELP: 6.5 g/dL (ref 6.0–8.5)

## 2021-11-18 ENCOUNTER — Emergency Department (HOSPITAL_COMMUNITY)
Admission: EM | Admit: 2021-11-18 | Discharge: 2021-11-18 | Disposition: A | Discharge status: Home / Self Care | Payer: Medicare Other | Attending: Emergency Medicine | Admitting: Emergency Medicine

## 2021-11-18 ENCOUNTER — Emergency Department (HOSPITAL_COMMUNITY): Payer: Medicare Other

## 2021-11-18 ENCOUNTER — Encounter (HOSPITAL_COMMUNITY): Payer: Self-pay

## 2021-11-18 DIAGNOSIS — I10 Essential (primary) hypertension: Secondary | ICD-10-CM | POA: Insufficient documentation

## 2021-11-18 DIAGNOSIS — J069 Acute upper respiratory infection, unspecified: Secondary | ICD-10-CM | POA: Insufficient documentation

## 2021-11-18 DIAGNOSIS — Z20822 Contact with and (suspected) exposure to covid-19: Secondary | ICD-10-CM | POA: Insufficient documentation

## 2021-11-18 DIAGNOSIS — Z79899 Other long term (current) drug therapy: Secondary | ICD-10-CM | POA: Diagnosis not present

## 2021-11-18 DIAGNOSIS — R059 Cough, unspecified: Secondary | ICD-10-CM | POA: Diagnosis not present

## 2021-11-18 DIAGNOSIS — B9789 Other viral agents as the cause of diseases classified elsewhere: Secondary | ICD-10-CM | POA: Diagnosis not present

## 2021-11-18 LAB — CBC WITH DIFFERENTIAL/PLATELET
Abs Immature Granulocytes: 0.01 10*3/uL (ref 0.00–0.07)
Basophils Absolute: 0 10*3/uL (ref 0.0–0.1)
Basophils Relative: 0 %
Eosinophils Absolute: 0.1 10*3/uL (ref 0.0–0.5)
Eosinophils Relative: 2 %
HCT: 37.1 % — ABNORMAL LOW (ref 39.0–52.0)
Hemoglobin: 12.5 g/dL — ABNORMAL LOW (ref 13.0–17.0)
Immature Granulocytes: 0 %
Lymphocytes Relative: 25 %
Lymphs Abs: 1.2 10*3/uL (ref 0.7–4.0)
MCH: 31.9 pg (ref 26.0–34.0)
MCHC: 33.7 g/dL (ref 30.0–36.0)
MCV: 94.6 fL (ref 80.0–100.0)
Monocytes Absolute: 0.5 10*3/uL (ref 0.1–1.0)
Monocytes Relative: 11 %
Neutro Abs: 3 10*3/uL (ref 1.7–7.7)
Neutrophils Relative %: 62 %
Platelets: 155 10*3/uL (ref 150–400)
RBC: 3.92 MIL/uL — ABNORMAL LOW (ref 4.22–5.81)
RDW: 13.1 % (ref 11.5–15.5)
WBC: 4.9 10*3/uL (ref 4.0–10.5)
nRBC: 0 % (ref 0.0–0.2)

## 2021-11-18 LAB — BASIC METABOLIC PANEL
Anion gap: 6 (ref 5–15)
BUN: 11 mg/dL (ref 8–23)
CO2: 23 mmol/L (ref 22–32)
Calcium: 9 mg/dL (ref 8.9–10.3)
Chloride: 109 mmol/L (ref 98–111)
Creatinine, Ser: 0.93 mg/dL (ref 0.61–1.24)
GFR, Estimated: >60 mL/min (ref >60)
Glucose, Bld: 117 mg/dL — ABNORMAL HIGH (ref 70–99)
Potassium: 3.8 mmol/L (ref 3.5–5.1)
Sodium: 138 mmol/L (ref 135–145)

## 2021-11-18 LAB — RESP PANEL BY RT-PCR (FLU A&B, COVID) ARPGX2
Influenza A by PCR: NEGATIVE
Influenza B by PCR: NEGATIVE
SARS Coronavirus 2 by RT PCR: NEGATIVE

## 2021-11-18 LAB — GROUP A STREP BY PCR: Group A Strep by PCR: NOT DETECTED

## 2021-11-18 MED ORDER — DEXAMETHASONE 4 MG PO TABS
10.0000 mg | ORAL_TABLET | Freq: Once | ORAL | Status: AC
  Administered 2021-11-18: 10 mg via ORAL
  Filled 2021-11-18: qty 3

## 2021-11-18 NOTE — ED Triage Notes (Signed)
Here for productive cough, night sweats, sore throat. Denies sob.  ?

## 2021-11-18 NOTE — ED Provider Notes (Signed)
?[facility] ?Provider Note ? ? ?CSN: [id number] ?Arrival date & time: 11/18/21  1523 ? ?  ? ?History ? ?Chief Complaint  ?Patient presents with  ? Cough  ? ? ?Travis Nguyen is a 71 y.o. male. ? ?Patient with cough, congestion, sore throat, fevers and chills the last several days.  Not sure if he has had any fever.  He is taking Tylenol as needed.  Denies any sputum production.  Denies any chest pain or shortness of breath.  No abdominal pain, nausea, vomiting.  Nothing has made it better or worse.  History of hypertension. ? ? ?Cough ? ?  ? ?Home Medications ?Prior to Admission medications   ?Medication Sig Start Date End Date Taking? Authorizing Provider  ?acyclovir (ZOVIRAX) 400 MG tablet TAKE 1 TABLET BY MOUTH DAILY. ?Patient not taking: Reported on 12/14/2020 09/28/20 01/01/22  Wyatt Portela, MD  ?amLODipine (NORVASC) 10 MG tablet Take 1 tablet by mouth once a day ?Patient taking differently: Take 10 mg by mouth daily. 04/24/21     ?amLODipine (NORVASC) 5 MG tablet Take 1 tablet by mouth once a day 07/24/21     ?doxycycline (VIBRAMYCIN) 100 MG capsule Take 1 capsule by mouth 2 times daily. 07/17/21   Smoot, Sarah A, PA-C  ?escitalopram (LEXAPRO) 10 MG tablet Take 1 tablet by mouth once a day ?Patient taking differently: Take 10 mg by mouth daily. 04/24/21     ?escitalopram (LEXAPRO) 10 MG tablet Take 1 tablet by mouth once daily 07/24/21     ?lidocaine-prilocaine (EMLA) cream APPLY TOPICALLY TO PORT-A-CATH DAILY AS NEEDED ?Patient taking differently: 1 application daily as needed (port). 01/29/21 01/29/22  Wyatt Portela, MD  ?losartan (COZAAR) 100 MG tablet TAKE 1 TABLET BY MOUTH ONCE A DAY ?Patient not taking: Reported on 07/17/2021 06/06/20 06/06/21  [name], MD  ?losartan (COZAAR) 100 MG tablet Take 1 tablet by mouth once a day ?Patient taking differently: Take 100 mg by mouth daily. 04/24/21     ?losartan (COZAAR) 100 MG tablet Take 1 tablet by mouth daily 07/24/21      ?oxyCODONE-acetaminophen (PERCOCET) 10-325 MG tablet Take 1 tablet by mouth 2 times a day ?Patient taking differently: Take 1 tablet by mouth 2 (two) times daily as needed for pain. 12/15/20     ?oxyCODONE-acetaminophen (PERCOCET) 10-325 MG tablet Take 1 tablet by mouth twice daily as needed (07/05/21) ?Patient not taking: Reported on 07/17/2021 07/02/21     ?oxyCODONE-acetaminophen (PERCOCET) 10-325 MG tablet Take 1 tablet by mouth 2 (two) times daily as needed. 08/03/21     ?oxyCODONE-acetaminophen (PERCOCET) 10-325 MG tablet Take 1 tablet by mouth twice daily as needed ...ok to fill 30 days after last refill 10/30/21     ?pantoprazole (PROTONIX) 20 MG tablet Take 1 tablet (20 mg total) by mouth daily. ?Patient not taking: Reported on 07/17/2021 01/07/21   Quintella Reichert, MD  ?vitamin C (ASCORBIC ACID) 500 MG tablet Take 500 mg by mouth daily.    [provider]  ?   ? ?Allergies    ?Aspirin   ? ?Review of Systems   ?Review of Systems  ?Respiratory:  Positive for cough.   ? ?Physical Exam ?Updated Vital Signs ?BP (!) 178/97   Pulse 72   Temp 98.9 ?F (37.2 ?C) (Oral)   Resp 15   SpO2 100%  ?Physical Exam ?Vitals and nursing note reviewed.  ?Constitutional:   ?   General: He is not in acute distress. ?  Appearance: He is well-developed. He is not ill-appearing.  ?HENT:  ?   Head: Normocephalic and atraumatic.  ?   Nose: Congestion present.  ?   Mouth/Throat:  ?   Mouth: Mucous membranes are moist.  ?Eyes:  ?   Extraocular Movements: Extraocular movements intact.  ?   Conjunctiva/sclera: Conjunctivae normal.  ?   Pupils: Pupils are equal, round, and reactive to light.  ?Cardiovascular:  ?   Rate and Rhythm: Normal rate and regular rhythm.  ?   Pulses: Normal pulses.  ?   Heart sounds: No murmur heard. ?Pulmonary:  ?   Effort: Pulmonary effort is normal. No respiratory distress.  ?   Breath sounds: Normal breath sounds.  ?Abdominal:  ?   Palpations: Abdomen is soft.  ?   Tenderness: There is no abdominal  tenderness.  ?Musculoskeletal:     ?   General: No swelling.  ?   Cervical back: Normal range of motion and neck supple.  ?Skin: ?   General: Skin is warm and dry.  ?   Capillary Refill: Capillary refill takes less than 2 seconds.  ?Neurological:  ?   Mental Status: He is alert.  ?Psychiatric:     ?   Mood and Affect: Mood normal.  ? ? ?ED Results / Procedures / Treatments   ?Labs ?(all labs ordered are listed, but only abnormal results are displayed) ?Labs Reviewed  ?CBC WITH DIFFERENTIAL/PLATELET - Abnormal; Notable for the following components:  ?    Result Value  ? RBC 3.92 (*)   ? Hemoglobin 12.5 (*)   ? HCT 37.1 (*)   ? All other components within normal limits  ?BASIC METABOLIC PANEL - Abnormal; Notable for the following components:  ? Glucose, Bld 117 (*)   ? All other components within normal limits  ?RESP PANEL BY RT-PCR (FLU A&B, COVID) ARPGX2  ?GROUP A STREP BY PCR  ? ? ?EKG ?None ? ?Radiology ?DG Chest Portable 1 View ? ?Result Date: 11/18/2021 ?CLINICAL DATA:  Cough. EXAM: PORTABLE CHEST 1 VIEW COMPARISON:  December 14, 2020 FINDINGS: Stable right Port-A-Cath. No pneumothorax. The heart, hila, and mediastinum are normal. No pulmonary nodules or masses. No focal infiltrates. No other acute abnormalities. IMPRESSION: No active disease. Electronically Signed   By: Dorise Bullion III M.D.   On: 11/18/2021 16:41   ? ?Procedures ?Procedures  ? ? ?Medications Ordered in ED ?Medications  ?dexamethasone (DECADRON) tablet 10 mg (has no administration in time range)  ? ? ?ED Course/ Medical Decision Making/ A&P ?  ?                        ?Medical Decision Making ?Amount and/or Complexity of Data Reviewed ?Labs: ordered. ?Radiology: ordered. ? ?Risk ?Prescription drug management. ? ? ?Travis Nguyen is here for cough and viral symptoms.  Unremarkable vitals.  No fever.  No major significant medical history.  Has had body aches and chills last several days with sore throat.  Overall he appears well.  Clear breath  sounds.  Chest x-ray per my review and interpretation shows no evidence of pneumonia.  He has no significant anemia, electrolyte abnormality, kidney injury.  Overall suspect a viral process.  He is minimally symptomatic.  We will give a dose of Decadron.  Recommend Tylenol and ibuprofen for continued management at home.  Discharged in good condition. ? ?This chart was dictated using voice recognition software.  Despite best efforts to proofread,  errors  can occur which can change the documentation meaning.  ? ? ? ? ? ? ? ?Final Clinical Impression(s) / ED Diagnoses ?Final diagnoses:  ?Viral URI with cough  ? ? ?Rx / DC Orders ?ED Discharge Orders   ? ? None  ? ?  ? ? ?  ?Lennice Sites, DO ?11/18/21 1719 ? ?

## 2021-11-27 ENCOUNTER — Other Ambulatory Visit (HOSPITAL_COMMUNITY): Payer: Self-pay

## 2021-11-27 MED ORDER — OXYCODONE-ACETAMINOPHEN 10-325 MG PO TABS
ORAL_TABLET | ORAL | 0 refills | Status: DC
  Filled 2021-11-27 – 2021-12-01 (×2): qty 60, 30d supply, fill #0

## 2021-11-30 ENCOUNTER — Emergency Department (HOSPITAL_COMMUNITY)
Admission: EM | Admit: 2021-11-30 | Discharge: 2021-11-30 | Disposition: A | Discharge status: Home / Self Care | Payer: Medicare Other | Attending: Emergency Medicine | Admitting: Emergency Medicine

## 2021-11-30 ENCOUNTER — Emergency Department (HOSPITAL_COMMUNITY): Payer: Medicare Other

## 2021-11-30 ENCOUNTER — Encounter (HOSPITAL_COMMUNITY): Payer: Self-pay | Admitting: Emergency Medicine

## 2021-11-30 ENCOUNTER — Other Ambulatory Visit: Payer: Self-pay

## 2021-11-30 DIAGNOSIS — R059 Cough, unspecified: Secondary | ICD-10-CM | POA: Diagnosis not present

## 2021-11-30 DIAGNOSIS — I10 Essential (primary) hypertension: Secondary | ICD-10-CM | POA: Insufficient documentation

## 2021-11-30 DIAGNOSIS — B9789 Other viral agents as the cause of diseases classified elsewhere: Secondary | ICD-10-CM | POA: Diagnosis not present

## 2021-11-30 DIAGNOSIS — Z8546 Personal history of malignant neoplasm of prostate: Secondary | ICD-10-CM | POA: Diagnosis not present

## 2021-11-30 DIAGNOSIS — Z79899 Other long term (current) drug therapy: Secondary | ICD-10-CM | POA: Insufficient documentation

## 2021-11-30 DIAGNOSIS — J069 Acute upper respiratory infection, unspecified: Secondary | ICD-10-CM

## 2021-11-30 NOTE — ED Provider Notes (Signed)
[facility] EMERGENCY DEPARTMENT Provider Note   CSN: [id number] Arrival date & time: 11/30/21  1725     History  Chief Complaint  Patient presents with   Cough    Travis Nguyen is a 71 y.o. male with a history of prostate cancer s/p prostatectomy, multiple myeloma, HTN presenting to the ED with cough and rhinorrhea.  Patient was seen in the ED about 2 weeks ago with similar symptoms.  At that time he was treated with Decadron and states that his cough slightly improved for a few days, but then returned.  He has not had any recent fevers.  He states that the cough is productive with clear sputum.  Endorses some associated rhinorrhea as well.  No chest pain, shortness of breath, abdominal pain, nausea/vomiting/diarrhea.  No known sick contacts.   Cough Associated symptoms: no chest pain, no fever and no shortness of breath       Home Medications Prior to Admission medications   Medication Sig Start Date End Date Taking? Authorizing Provider  acyclovir (ZOVIRAX) 400 MG tablet TAKE 1 TABLET BY MOUTH DAILY. Patient not taking: Reported on 12/14/2020 09/28/20 01/01/22  Wyatt Portela, MD  amLODipine (NORVASC) 10 MG tablet Take 1 tablet by mouth once a day Patient taking differently: Take 10 mg by mouth daily. 04/24/21     amLODipine (NORVASC) 5 MG tablet Take 1 tablet by mouth once a day 07/24/21     doxycycline (VIBRAMYCIN) 100 MG capsule Take 1 capsule by mouth 2 times daily. 07/17/21   Smoot, Sarah A, PA-C  escitalopram (LEXAPRO) 10 MG tablet Take 1 tablet by mouth once a day Patient taking differently: Take 10 mg by mouth daily. 04/24/21     escitalopram (LEXAPRO) 10 MG tablet Take 1 tablet by mouth once daily 07/24/21     lidocaine-prilocaine (EMLA) cream APPLY TOPICALLY TO PORT-A-CATH DAILY AS NEEDED Patient taking differently: 1 application daily as needed (port). 01/29/21 01/29/22  Wyatt Portela, MD  losartan (COZAAR) 100 MG tablet TAKE 1 TABLET BY MOUTH ONCE A  DAY Patient not taking: Reported on 07/17/2021 06/06/20 06/06/21  [name], MD  losartan (COZAAR) 100 MG tablet Take 1 tablet by mouth once a day Patient taking differently: Take 100 mg by mouth daily. 04/24/21     losartan (COZAAR) 100 MG tablet Take 1 tablet by mouth daily 07/24/21     oxyCODONE-acetaminophen (PERCOCET) 10-325 MG tablet Take 1 tablet by mouth 2 times a day Patient taking differently: Take 1 tablet by mouth 2 (two) times daily as needed for pain. 12/15/20     oxyCODONE-acetaminophen (PERCOCET) 10-325 MG tablet Take 1 tablet by mouth twice daily as needed (07/05/21) Patient not taking: Reported on 07/17/2021 07/02/21     oxyCODONE-acetaminophen (PERCOCET) 10-325 MG tablet Take 1 tablet by mouth 2 (two) times daily as needed. 08/03/21     oxyCODONE-acetaminophen (PERCOCET) 10-325 MG tablet Take 1 tablet by mouth twice daily as needed 11/27/21     pantoprazole (PROTONIX) 20 MG tablet Take 1 tablet (20 mg total) by mouth daily. Patient not taking: Reported on 07/17/2021 01/07/21   Quintella Reichert, MD  vitamin C (ASCORBIC ACID) 500 MG tablet Take 500 mg by mouth daily.    [provider]      Allergies    Aspirin    Review of Systems   Review of Systems  Constitutional:  Negative for fever.  Respiratory:  Positive for cough. Negative for chest tightness and shortness of  breath.   Cardiovascular:  Negative for chest pain, palpitations and leg swelling.  Gastrointestinal:  Negative for abdominal pain, diarrhea, nausea and vomiting.  Neurological:  Negative for light-headedness.   Physical Exam Updated Vital Signs BP (!) 167/83 (BP Location: Left Arm)   Pulse 71   Temp 98.4 F (36.9 C) (Oral)   Resp 16   SpO2 99%  Physical Exam Constitutional:      General: He is not in acute distress.    Appearance: He is normal weight. He is not toxic-appearing or diaphoretic.  HENT:     Head: Normocephalic and atraumatic.     Right Ear: External ear normal.     Left Ear:  External ear normal.     Nose: Nose normal. No congestion or rhinorrhea.     Mouth/Throat:     Mouth: Mucous membranes are moist.     Pharynx: Oropharynx is clear.  Eyes:     General: No scleral icterus. Cardiovascular:     Rate and Rhythm: Normal rate and regular rhythm.     Heart sounds: Normal heart sounds. No murmur heard.   No friction rub. No gallop.  Pulmonary:     Effort: Pulmonary effort is normal. No respiratory distress.     Breath sounds: Normal breath sounds. No stridor. No wheezing, rhonchi or rales.  Abdominal:     General: There is no distension.     Palpations: Abdomen is soft.     Tenderness: There is no abdominal tenderness. There is no guarding or rebound.  Musculoskeletal:        General: No deformity.     Cervical back: Neck supple.  Skin:    General: Skin is warm and dry.  Neurological:     General: No focal deficit present.     Mental Status: He is alert and oriented to person, place, and time.    ED Results / Procedures / Treatments   Labs (all labs ordered are listed, but only abnormal results are displayed) Labs Reviewed - No data to display  EKG None  Radiology DG Chest 2 View  Result Date: 11/30/2021 CLINICAL DATA:  Cough and rhinorrhea EXAM: CHEST - 2 VIEW COMPARISON:  11/18/2021 FINDINGS: Right chest port with catheter tip at the superior cavoatrial junction. Cardiac and mediastinal contours are within normal limits. No focal pulmonary opacity. No pleural effusion or pneumothorax. No acute osseous abnormality. IMPRESSION: No acute cardiopulmonary process. Electronically Signed   By: Merilyn Baba M.D.   On: 11/30/2021 18:42   3 Procedures Procedures   Medications Ordered in ED Medications - No data to display  ED Course/ Medical Decision Making/ A&P                           Medical Decision Making  Travis Nguyen is a 71 y.o. male with a history of prostate cancer s/p prostatectomy, multiple myeloma, HTN presenting to the ED with  cough and rhinorrhea.  On exam, the patient is afebrile and hemodynamically stable.  His lungs are clear to auscultation in all lung fields.  He is well-appearing on exam and throughout my examination he is not actively coughing.  Patient was seen in the ED about 2 weeks ago for similar symptoms.  At that time he was having some fever and chills which she denies at this time.  I think his symptoms are likely persistent cough from his recent viral illness.  Chest x-ray did not show any  focal consolidations concerning for pneumonia or any other acute findings.  Do not think that further imaging or lab work is needed at this time.  Recommended symptomatic treatment as well as follow-up with his PCP in 1 week.  Patient verbalized understanding.  Strict return precautions were discussed and the patient was discharged home in stable condition.  Final Clinical Impression(s) / ED Diagnoses Final diagnoses:  Viral URI with cough    Rx / DC Orders ED Discharge Orders     None         Sondra Come, MD 11/30/21 Earney Navy    Gareth Morgan, MD 12/02/21 1235

## 2021-11-30 NOTE — ED Provider Triage Note (Signed)
Emergency Medicine Provider Triage Evaluation Note  Travis Nguyen , a 71 y.o. male  was evaluated in triage.  Pt complains of productive cough onset earlier this week.  Patient was evaluated in the ED on 11/18/2021 for similar symptoms of cough, rhinorrhea, nasal congestion.  At that time had a negative work-up and was sent home with medications.  He notes that his symptoms resolved however returned again after completion of the medication.  Denies sick contacts.  Denies fever, chills, chest pain, shortness of breath, abdominal pain, nausea, vomiting.  Review of Systems  Positive: As per HPI above Negative:   Physical Exam  BP (!) 167/83 (BP Location: Left Arm)   Pulse 71   Temp 98.4 F (36.9 C) (Oral)   Resp 16   SpO2 99%  Gen:   Awake, no distress   Resp:  Normal effort  MSK:   Moves extremities without difficulty  Other:    Medical Decision Making  Medically screening exam initiated at 6:16 PM.  Appropriate orders placed.  Travis Nguyen was informed that the remainder of the evaluation will be completed by another provider, this initial triage assessment does not replace that evaluation, and the importance of remaining in the ED until their evaluation is complete.  Work-up initiated   Shanta Dorvil A, PA-C 11/30/21 1816

## 2021-11-30 NOTE — Discharge Instructions (Signed)
Your chest x-ray did not show any signs of pneumonia or other concerning findings. Please follow-up with your primary care doctor in 1 week if your symptoms have not resolved.  If you develop fever, worsening cough, chest pain, or shortness of breath, please return to the emergency department or see your primary care doctor.

## 2021-11-30 NOTE — ED Triage Notes (Signed)
Patient here for cough, was here for same on 5/7, given steroids, states finished them and started coughing again.

## 2021-12-01 ENCOUNTER — Other Ambulatory Visit (HOSPITAL_COMMUNITY): Payer: Self-pay

## 2021-12-11 ENCOUNTER — Other Ambulatory Visit: Payer: Self-pay

## 2021-12-11 ENCOUNTER — Inpatient Hospital Stay: Payer: Medicare Other

## 2021-12-11 ENCOUNTER — Inpatient Hospital Stay (HOSPITAL_BASED_OUTPATIENT_CLINIC_OR_DEPARTMENT_OTHER): Payer: Medicare Other | Admitting: Oncology

## 2021-12-11 VITALS — BP 151/85 | HR 67 | Temp 98.4°F | Wt 156.1 lb

## 2021-12-11 DIAGNOSIS — Z95828 Presence of other vascular implants and grafts: Secondary | ICD-10-CM | POA: Diagnosis not present

## 2021-12-11 DIAGNOSIS — D649 Anemia, unspecified: Secondary | ICD-10-CM | POA: Diagnosis not present

## 2021-12-11 DIAGNOSIS — Z7969 Long term (current) use of other immunomodulators and immunosuppressants: Secondary | ICD-10-CM | POA: Diagnosis not present

## 2021-12-11 DIAGNOSIS — C61 Malignant neoplasm of prostate: Secondary | ICD-10-CM

## 2021-12-11 DIAGNOSIS — C9001 Multiple myeloma in remission: Secondary | ICD-10-CM

## 2021-12-11 DIAGNOSIS — C9002 Multiple myeloma in relapse: Secondary | ICD-10-CM | POA: Diagnosis not present

## 2021-12-11 DIAGNOSIS — C9000 Multiple myeloma not having achieved remission: Secondary | ICD-10-CM | POA: Diagnosis not present

## 2021-12-11 DIAGNOSIS — Z886 Allergy status to analgesic agent status: Secondary | ICD-10-CM | POA: Diagnosis not present

## 2021-12-11 DIAGNOSIS — Z79899 Other long term (current) drug therapy: Secondary | ICD-10-CM | POA: Diagnosis not present

## 2021-12-11 DIAGNOSIS — Z5112 Encounter for antineoplastic immunotherapy: Secondary | ICD-10-CM | POA: Diagnosis not present

## 2021-12-11 LAB — CMP (CANCER CENTER ONLY)
ALT: 16 U/L (ref 0–44)
AST: 18 U/L (ref 15–41)
Albumin: 4.1 g/dL (ref 3.5–5.0)
Alkaline Phosphatase: 69 U/L (ref 38–126)
Anion gap: 4 — ABNORMAL LOW (ref 5–15)
BUN: 12 mg/dL (ref 8–23)
CO2: 28 mmol/L (ref 22–32)
Calcium: 9.3 mg/dL (ref 8.9–10.3)
Chloride: 104 mmol/L (ref 98–111)
Creatinine: 0.91 mg/dL (ref 0.61–1.24)
GFR, Estimated: >60 mL/min (ref >60)
Glucose, Bld: 92 mg/dL (ref 70–99)
Potassium: 4.4 mmol/L (ref 3.5–5.1)
Sodium: 136 mmol/L (ref 135–145)
Total Bilirubin: 0.5 mg/dL (ref 0.3–1.2)
Total Protein: 6.9 g/dL (ref 6.5–8.1)

## 2021-12-11 LAB — CBC WITH DIFFERENTIAL (CANCER CENTER ONLY)
Abs Immature Granulocytes: 0 10*3/uL (ref 0.00–0.07)
Basophils Absolute: 0 10*3/uL (ref 0.0–0.1)
Basophils Relative: 1 %
Eosinophils Absolute: 0.1 10*3/uL (ref 0.0–0.5)
Eosinophils Relative: 2 %
HCT: 34.3 % — ABNORMAL LOW (ref 39.0–52.0)
Hemoglobin: 11.8 g/dL — ABNORMAL LOW (ref 13.0–17.0)
Immature Granulocytes: 0 %
Lymphocytes Relative: 32 %
Lymphs Abs: 1.3 10*3/uL (ref 0.7–4.0)
MCH: 31.5 pg (ref 26.0–34.0)
MCHC: 34.4 g/dL (ref 30.0–36.0)
MCV: 91.5 fL (ref 80.0–100.0)
Monocytes Absolute: 0.4 10*3/uL (ref 0.1–1.0)
Monocytes Relative: 9 %
Neutro Abs: 2.2 10*3/uL (ref 1.7–7.7)
Neutrophils Relative %: 56 %
Platelet Count: 215 10*3/uL (ref 150–400)
RBC: 3.75 MIL/uL — ABNORMAL LOW (ref 4.22–5.81)
RDW: 13.1 % (ref 11.5–15.5)
WBC Count: 3.9 10*3/uL — ABNORMAL LOW (ref 4.0–10.5)
nRBC: 0 % (ref 0.0–0.2)

## 2021-12-11 MED ORDER — DEXAMETHASONE 4 MG PO TABS
40.0000 mg | ORAL_TABLET | Freq: Once | ORAL | Status: AC
  Administered 2021-12-11: 40 mg via ORAL
  Filled 2021-12-11: qty 10

## 2021-12-11 MED ORDER — DIPHENHYDRAMINE HCL 25 MG PO CAPS
50.0000 mg | ORAL_CAPSULE | Freq: Once | ORAL | Status: AC
  Administered 2021-12-11: 50 mg via ORAL
  Filled 2021-12-11: qty 2

## 2021-12-11 MED ORDER — HEPARIN SOD (PORK) LOCK FLUSH 100 UNIT/ML IV SOLN
500.0000 [IU] | Freq: Once | INTRAVENOUS | Status: AC | PRN
  Administered 2021-12-11: 500 [IU]

## 2021-12-11 MED ORDER — ACETAMINOPHEN 325 MG PO TABS
650.0000 mg | ORAL_TABLET | Freq: Once | ORAL | Status: AC
  Administered 2021-12-11: 650 mg via ORAL
  Filled 2021-12-11: qty 2

## 2021-12-11 MED ORDER — DARATUMUMAB-HYALURONIDASE-FIHJ 1800-30000 MG-UT/15ML ~~LOC~~ SOLN
1800.0000 mg | Freq: Once | SUBCUTANEOUS | Status: AC
  Administered 2021-12-11: 1800 mg via SUBCUTANEOUS
  Filled 2021-12-11: qty 15

## 2021-12-11 MED ORDER — SODIUM CHLORIDE 0.9% FLUSH
10.0000 mL | INTRAVENOUS | Status: DC | PRN
  Administered 2021-12-11: 10 mL

## 2021-12-11 NOTE — Patient Instructions (Signed)
[facility]   Discharge Instructions: Thank you for choosing [facility] to provide your oncology and hematology care.   If you have a lab appointment with the Cancer Center, please go directly to the Cancer Center and check in at the registration area.   Wear comfortable clothing and clothing appropriate for easy access to any Portacath or PICC line.   We strive to give you quality time with your provider. You may need to reschedule your appointment if you arrive late (15 or more minutes).  Arriving late affects you and other patients whose appointments are after yours.  Also, if you miss three or more appointments without notifying the office, you may be dismissed from the clinic at the provider's discretion.      For prescription refill requests, have your pharmacy contact our office and allow 72 hours for refills to be completed.    Today you received the following chemotherapy and/or immunotherapy agents: Daratumumab-hyaluronidase (Darazalex faspro)      To help prevent nausea and vomiting after your treatment, we encourage you to take your nausea medication as directed.  BELOW ARE SYMPTOMS THAT SHOULD BE REPORTED IMMEDIATELY: *FEVER GREATER THAN 100.4 F (38 C) OR HIGHER *CHILLS OR SWEATING *NAUSEA AND VOMITING THAT IS NOT CONTROLLED WITH YOUR NAUSEA MEDICATION *UNUSUAL SHORTNESS OF BREATH *UNUSUAL BRUISING OR BLEEDING *URINARY PROBLEMS (pain or burning when urinating, or frequent urination) *BOWEL PROBLEMS (unusual diarrhea, constipation, pain near the anus) TENDERNESS IN MOUTH AND THROAT WITH OR WITHOUT PRESENCE OF ULCERS (sore throat, sores in mouth, or a toothache) UNUSUAL RASH, SWELLING OR PAIN  UNUSUAL VAGINAL DISCHARGE OR ITCHING   Items with * indicate a potential emergency and should be followed up as soon as possible or go to the Emergency Department if any problems should occur.  Please show the CHEMOTHERAPY ALERT CARD or  IMMUNOTHERAPY ALERT CARD at check-in to the Emergency Department and triage nurse.  Should you have questions after your visit or need to cancel or reschedule your appointment, please contact [facility]  Dept: [phone]  and follow the prompts.  Office hours are 8:00 a.m. to 4:30 p.m. Monday - Friday. Please note that voicemails left after 4:00 p.m. may not be returned until the following business day.  We are closed weekends and major holidays. You have access to a nurse at all times for urgent questions. Please call the main number to the clinic Dept: [phone] and follow the prompts.   For any non-urgent questions, you may also contact your provider using MyChart. We now offer e-Visits for anyone 18 and older to request care online for non-urgent symptoms. For details visit [email or web address].   Also download the MyChart app! Go to the app store, search "MyChart", open the app, select [facility], and log in with your MyChart username and password.  Due to Covid, a mask is required upon entering the hospital/clinic. If you do not have a mask, one will be given to you upon arrival. For doctor visits, patients may have 1 support person aged 18 or older with them. For treatment visits, patients cannot have anyone with them due to current Covid guidelines and our immunocompromised population.  

## 2021-12-11 NOTE — Progress Notes (Signed)
Hematology and Oncology Follow Up Visit  Travis Nguyen [id number] [birth year] 71 y.o. 12/11/2021 8:15 AM Travis Nguyen, Travis Brow, MD   Principle Diagnosis: 71 year old man with IgG kappa multiple myeloma with relapsed disease in 2022.  He was initially diagnosed in 2014.   Secondary diagnosis: Stage a T1c, Gleason score 3+4 = 7 prostate cancer that is currently in remission.  Prior Therapy: He was treated initially with Cytoxan, Velcade and Decadron and subsequently his regimen changed to a Velcade, Revlimid with dexamethasone.  He achieved remission at that time.   He is status post a robotic-assisted laparoscopic radical prostatectomy and bilateral lymph node dissection on 02/09/2014. The final pathology showed prostate adenocarcinoma Gleason score 4+3 equals 7 involving both lobes.    Carfilzomib, and dexamethasone, daratumumab started on July 28, 2020.  Dexamethasone and carfilzomib is weekly with daratumumab every 2 weeks.  Therapy concluded in December 2022.  Current therapy: He is currently receiving Darzalex Faspro on a monthly maintenance started in January 2023.  Interim History:  Travis Nguyen returns today for a follow-up visit.  Since last visit, he reports feeling well without any major complaints.  He denies any nausea, vomiting or complications related to daratumumab.  He denies any hospitalizations or illnesses.    Medications: Reviewed without changes. Current Outpatient Medications  Medication Sig Dispense Refill   acyclovir (ZOVIRAX) 400 MG tablet TAKE 1 TABLET BY MOUTH DAILY. (Patient not taking: Reported on 12/14/2020) 90 tablet 3   amLODipine (NORVASC) 10 MG tablet Take 1 tablet by mouth once a day (Patient taking differently: Take 10 mg by mouth daily.) 90 tablet 1   amLODipine (NORVASC) 5 MG tablet Take 1 tablet by mouth once a day 90 tablet 1   doxycycline (VIBRAMYCIN) 100 MG capsule Take 1 capsule by mouth 2 times daily. 20 capsule 0   escitalopram  (LEXAPRO) 10 MG tablet Take 1 tablet by mouth once a day (Patient taking differently: Take 10 mg by mouth daily.) 90 tablet 1   escitalopram (LEXAPRO) 10 MG tablet Take 1 tablet by mouth once daily 90 tablet 1   lidocaine-prilocaine (EMLA) cream APPLY TOPICALLY TO PORT-A-CATH DAILY AS NEEDED (Patient taking differently: 1 application daily as needed (port).) 30 g 2   losartan (COZAAR) 100 MG tablet TAKE 1 TABLET BY MOUTH ONCE A DAY (Patient not taking: Reported on 07/17/2021) 90 tablet 0   losartan (COZAAR) 100 MG tablet Take 1 tablet by mouth once a day (Patient taking differently: Take 100 mg by mouth daily.) 90 tablet 1   losartan (COZAAR) 100 MG tablet Take 1 tablet by mouth daily 90 tablet 1   oxyCODONE-acetaminophen (PERCOCET) 10-325 MG tablet Take 1 tablet by mouth 2 times a day (Patient taking differently: Take 1 tablet by mouth 2 (two) times daily as needed for pain.) 60 tablet 0   oxyCODONE-acetaminophen (PERCOCET) 10-325 MG tablet Take 1 tablet by mouth twice daily as needed (07/05/21) (Patient not taking: Reported on 07/17/2021) 60 tablet 0   oxyCODONE-acetaminophen (PERCOCET) 10-325 MG tablet Take 1 tablet by mouth 2 (two) times daily as needed. 60 tablet 0   oxyCODONE-acetaminophen (PERCOCET) 10-325 MG tablet Take 1 tablet by mouth twice daily as needed 60 tablet 0   pantoprazole (PROTONIX) 20 MG tablet Take 1 tablet (20 mg total) by mouth daily. (Patient not taking: Reported on 07/17/2021) 14 tablet 0   vitamin C (ASCORBIC ACID) 500 MG tablet Take 500 mg by mouth daily.     No current facility-administered medications for  this visit.     Allergies:  Allergies  Allergen Reactions   Aspirin     Pt stated "raises BP"        Physical Exam:       Blood pressure (!) 151/85, pulse 67, temperature 98.4 F (36.9 C), temperature source Temporal, weight 156 lb 1.6 oz (70.8 kg), SpO2 98 %.         ECOG: 1    General appearance: Alert, awake without any distress. Head:  Atraumatic without abnormalities Oropharynx: Without any thrush or ulcers. Eyes: No scleral icterus. Lymph nodes: No lymphadenopathy noted in the cervical, supraclavicular, or axillary nodes Heart:regular rate and rhythm, without any murmurs or gallops.   Lung: Clear to auscultation without any rhonchi, wheezes or dullness to percussion. Abdomin: Soft, nontender without any shifting dullness or ascites. Musculoskeletal: No clubbing or cyanosis. Neurological: No motor or sensory deficits. Skin: No rashes or lesions.                    Lab Results: Lab Results  Component Value Date   WBC 3.9 (L) 12/11/2021   HGB 11.8 (L) 12/11/2021   HCT 34.3 (L) 12/11/2021   MCV 91.5 12/11/2021   PLT 215 12/11/2021     Chemistry      Component Value Date/Time   NA 138 11/18/2021 1541   NA 138 03/27/2017 0838   K 3.8 11/18/2021 1541   K 4.3 03/27/2017 0838   CL 109 11/18/2021 1541   CL 105 01/01/2013 0835   CO2 23 11/18/2021 1541   CO2 24 03/27/2017 0838   BUN 11 11/18/2021 1541   BUN 23.7 03/27/2017 0838   CREATININE 0.93 11/18/2021 1541   CREATININE 1.03 11/12/2021 0822   CREATININE 1.2 03/27/2017 0838      Component Value Date/Time   CALCIUM 9.0 11/18/2021 1541   CALCIUM 9.9 03/27/2017 0838   ALKPHOS 86 11/12/2021 0822   ALKPHOS 79 03/27/2017 0838   AST 21 11/12/2021 0822   AST 25 03/27/2017 0838   ALT 20 11/12/2021 0822   ALT 26 03/27/2017 0838   BILITOT 0.3 11/12/2021 0822   BILITOT 0.31 03/27/2017 0838         Latest Reference Range & Units 09/17/21 08:22 10/15/21 08:04 11/12/21 08:22  M Protein SerPl Elph-Mcnc Not Observed g/dL 0.6 (H) (C) 0.7 (H) (C) 0.7 (H) (C)  IFE 1  Comment ! (C) Comment ! (C) Comment ! (C)  Globulin, Total 2.2 - 3.9 g/dL 2.9 (C) 2.9 (C) 2.6 (C)  B-Globulin SerPl Elph-Mcnc 0.7 - 1.3 g/dL 0.9 (C) 0.8 (C) 0.8 (C)  IgG (Immunoglobin G), Serum 603 - 1,613 mg/dL 1,086 1,092 1,116  IgM (Immunoglobulin M), Srm 20 - 172 mg/dL 18 (L) 31 26   IgA 61 - 437 mg/dL 36 (L) 42 (L) 27 (L)  (H): Data is abnormally high !: Data is abnormal (L): Data is abnormally low (C): Corrected    Impression and Plan:  71 year old man with:   1.  Relapsed multiple myeloma initially diagnosed in 2014 with relapsed disease in 2022.    He continues to be on maintenance daratumumab without any major complications.  Risks and benefits of continuing this treatment were reviewed.  Protein studies obtained on Nov 12, 2021 showed stable disease.  He is agreeable to continue at this time despite the residual M spike he is asymptomatic and with willing to continue.    2.  Anemia: His hemoglobin is stable without any need for transfusion  or growth factor support.  This is related to multiple myeloma.   3.  IV access: Port-A-Cath currently in place without any issues.   4.  Antiemetics: No nausea or vomiting reported at this time.  Compazine is available to him.   5.  VZV prophylaxis: He is currently on acyclovir without any reactivation.  6. Followup: In 4 weeks for repeat follow-up.   30  minutes were dedicated to this encounter.  The time was spent on reviewing laboratory data, disease status and answering questions regarding future plan of care.   Zola Button, MD 5/30/20238:15 AM

## 2021-12-27 ENCOUNTER — Other Ambulatory Visit (HOSPITAL_COMMUNITY): Payer: Self-pay

## 2021-12-27 MED ORDER — OXYCODONE-ACETAMINOPHEN 10-325 MG PO TABS
ORAL_TABLET | ORAL | 0 refills | Status: DC
  Filled 2021-12-27 – 2021-12-31 (×3): qty 60, 30d supply, fill #0
  Filled ????-??-??: fill #0

## 2021-12-28 ENCOUNTER — Telehealth: Payer: Self-pay | Admitting: Oncology

## 2021-12-28 NOTE — Telephone Encounter (Signed)
Called patient regarding upcoming June/July appointments, left a voicemail.

## 2021-12-31 ENCOUNTER — Other Ambulatory Visit (HOSPITAL_COMMUNITY): Payer: Self-pay

## 2022-01-08 ENCOUNTER — Inpatient Hospital Stay: Payer: Medicare Other

## 2022-01-08 ENCOUNTER — Inpatient Hospital Stay: Payer: Medicare Other | Admitting: Oncology

## 2022-01-09 ENCOUNTER — Telehealth: Payer: Self-pay | Admitting: Oncology

## 2022-01-09 ENCOUNTER — Other Ambulatory Visit: Payer: Self-pay | Admitting: Oncology

## 2022-01-09 NOTE — Telephone Encounter (Signed)
.  Called pt per 6/28 inbasket , Patient was unavailable, a message with appt time and date was left with number on file.

## 2022-01-18 ENCOUNTER — Ambulatory Visit: Payer: Medicare Other | Admitting: Physician Assistant

## 2022-01-18 ENCOUNTER — Ambulatory Visit: Payer: Medicare Other

## 2022-01-18 ENCOUNTER — Other Ambulatory Visit: Payer: Medicare Other

## 2022-01-28 ENCOUNTER — Other Ambulatory Visit (HOSPITAL_COMMUNITY): Payer: Self-pay

## 2022-01-28 MED ORDER — OXYCODONE-ACETAMINOPHEN 10-325 MG PO TABS
ORAL_TABLET | ORAL | 0 refills | Status: DC
  Filled 2022-01-28 – 2022-01-30 (×2): qty 60, 30d supply, fill #0

## 2022-01-29 ENCOUNTER — Telehealth: Payer: Self-pay | Admitting: Oncology

## 2022-01-29 NOTE — Telephone Encounter (Signed)
Called patient regarding Rescheduled August appointment, contacted all three numbers. I was unable to leave a voicemail or reach anyone at all three, calender will be mailed.

## 2022-01-30 ENCOUNTER — Other Ambulatory Visit (HOSPITAL_COMMUNITY): Payer: Self-pay

## 2022-02-04 ENCOUNTER — Other Ambulatory Visit: Payer: Self-pay
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
## 2022-02-06 ENCOUNTER — Inpatient Hospital Stay: Payer: Medicare Other | Attending: Oncology

## 2022-02-06 ENCOUNTER — Inpatient Hospital Stay: Payer: Medicare Other

## 2022-02-06 ENCOUNTER — Inpatient Hospital Stay: Payer: Medicare Other | Admitting: Physician Assistant

## 2022-02-12 ENCOUNTER — Other Ambulatory Visit: Payer: Self-pay

## 2022-02-18 ENCOUNTER — Other Ambulatory Visit (HOSPITAL_COMMUNITY): Payer: Self-pay

## 2022-02-22 ENCOUNTER — Other Ambulatory Visit: Payer: Self-pay | Admitting: Oncology

## 2022-02-22 DIAGNOSIS — C9001 Multiple myeloma in remission: Secondary | ICD-10-CM

## 2022-02-24 ENCOUNTER — Other Ambulatory Visit: Payer: Self-pay

## 2022-02-25 ENCOUNTER — Other Ambulatory Visit (HOSPITAL_COMMUNITY): Payer: Self-pay

## 2022-02-25 DIAGNOSIS — E7800 Pure hypercholesterolemia, unspecified: Secondary | ICD-10-CM | POA: Diagnosis not present

## 2022-02-25 DIAGNOSIS — C9000 Multiple myeloma not having achieved remission: Secondary | ICD-10-CM | POA: Diagnosis not present

## 2022-02-25 DIAGNOSIS — M5416 Radiculopathy, lumbar region: Secondary | ICD-10-CM | POA: Diagnosis not present

## 2022-02-25 DIAGNOSIS — I10 Essential (primary) hypertension: Secondary | ICD-10-CM | POA: Diagnosis not present

## 2022-02-25 MED ORDER — AMLODIPINE BESYLATE 5 MG PO TABS
5.0000 mg | ORAL_TABLET | Freq: Every day | ORAL | 1 refills | Status: DC
  Filled 2022-02-25: qty 90, 90d supply, fill #0

## 2022-02-25 MED ORDER — ESCITALOPRAM OXALATE 10 MG PO TABS
10.0000 mg | ORAL_TABLET | Freq: Every day | ORAL | 1 refills | Status: DC
  Filled 2022-02-25: qty 90, 90d supply, fill #0

## 2022-02-25 MED ORDER — OXYCODONE-ACETAMINOPHEN 10-325 MG PO TABS
ORAL_TABLET | ORAL | 0 refills | Status: DC
  Filled 2022-03-02: qty 60, 30d supply, fill #0

## 2022-02-25 MED ORDER — LOSARTAN POTASSIUM 100 MG PO TABS
100.0000 mg | ORAL_TABLET | Freq: Every day | ORAL | 1 refills | Status: DC
  Filled 2022-02-25 – 2022-07-29 (×2): qty 90, 90d supply, fill #0
  Filled 2022-11-22: qty 90, 90d supply, fill #1

## 2022-03-02 ENCOUNTER — Other Ambulatory Visit (HOSPITAL_COMMUNITY): Payer: Self-pay

## 2022-03-06 ENCOUNTER — Other Ambulatory Visit: Payer: Medicare Other

## 2022-03-06 ENCOUNTER — Ambulatory Visit: Payer: Medicare Other | Admitting: Oncology

## 2022-03-06 ENCOUNTER — Ambulatory Visit: Payer: Medicare Other

## 2022-03-07 ENCOUNTER — Inpatient Hospital Stay: Payer: Medicare Other | Attending: Oncology

## 2022-03-07 ENCOUNTER — Inpatient Hospital Stay: Payer: Medicare Other

## 2022-03-07 ENCOUNTER — Inpatient Hospital Stay (HOSPITAL_BASED_OUTPATIENT_CLINIC_OR_DEPARTMENT_OTHER): Payer: Medicare Other | Admitting: Oncology

## 2022-03-07 ENCOUNTER — Other Ambulatory Visit: Payer: Self-pay

## 2022-03-07 VITALS — BP 189/90 | HR 62 | Temp 97.7°F | Resp 16 | Ht 73.0 in | Wt 155.2 lb

## 2022-03-07 VITALS — BP 189/94 | HR 56 | Resp 16

## 2022-03-07 DIAGNOSIS — Z79899 Other long term (current) drug therapy: Secondary | ICD-10-CM | POA: Diagnosis not present

## 2022-03-07 DIAGNOSIS — C9000 Multiple myeloma not having achieved remission: Secondary | ICD-10-CM

## 2022-03-07 DIAGNOSIS — Z7969 Long term (current) use of other immunomodulators and immunosuppressants: Secondary | ICD-10-CM | POA: Diagnosis not present

## 2022-03-07 DIAGNOSIS — Z886 Allergy status to analgesic agent status: Secondary | ICD-10-CM | POA: Diagnosis not present

## 2022-03-07 DIAGNOSIS — C9001 Multiple myeloma in remission: Secondary | ICD-10-CM

## 2022-03-07 DIAGNOSIS — Z95828 Presence of other vascular implants and grafts: Secondary | ICD-10-CM

## 2022-03-07 DIAGNOSIS — Z5112 Encounter for antineoplastic immunotherapy: Secondary | ICD-10-CM | POA: Insufficient documentation

## 2022-03-07 DIAGNOSIS — D649 Anemia, unspecified: Secondary | ICD-10-CM | POA: Diagnosis not present

## 2022-03-07 DIAGNOSIS — C61 Malignant neoplasm of prostate: Secondary | ICD-10-CM | POA: Insufficient documentation

## 2022-03-07 DIAGNOSIS — C9002 Multiple myeloma in relapse: Secondary | ICD-10-CM | POA: Insufficient documentation

## 2022-03-07 LAB — CMP (CANCER CENTER ONLY)
ALT: 75 U/L — ABNORMAL HIGH (ref 0–44)
AST: 32 U/L (ref 15–41)
Albumin: 4.5 g/dL (ref 3.5–5.0)
Alkaline Phosphatase: 113 U/L (ref 38–126)
Anion gap: 4 — ABNORMAL LOW (ref 5–15)
BUN: 15 mg/dL (ref 8–23)
CO2: 28 mmol/L (ref 22–32)
Calcium: 9.5 mg/dL (ref 8.9–10.3)
Chloride: 102 mmol/L (ref 98–111)
Creatinine: 0.8 mg/dL (ref 0.61–1.24)
GFR, Estimated: >60 mL/min (ref >60)
Glucose, Bld: 104 mg/dL — ABNORMAL HIGH (ref 70–99)
Potassium: 4.4 mmol/L (ref 3.5–5.1)
Sodium: 134 mmol/L — ABNORMAL LOW (ref 135–145)
Total Bilirubin: 0.5 mg/dL (ref 0.3–1.2)
Total Protein: 7.3 g/dL (ref 6.5–8.1)

## 2022-03-07 LAB — CBC WITH DIFFERENTIAL (CANCER CENTER ONLY)
Abs Immature Granulocytes: 0.01 10*3/uL (ref 0.00–0.07)
Basophils Absolute: 0 10*3/uL (ref 0.0–0.1)
Basophils Relative: 0 %
Eosinophils Absolute: 0 10*3/uL (ref 0.0–0.5)
Eosinophils Relative: 1 %
HCT: 36.5 % — ABNORMAL LOW (ref 39.0–52.0)
Hemoglobin: 12.9 g/dL — ABNORMAL LOW (ref 13.0–17.0)
Immature Granulocytes: 0 %
Lymphocytes Relative: 37 %
Lymphs Abs: 2 10*3/uL (ref 0.7–4.0)
MCH: 32.9 pg (ref 26.0–34.0)
MCHC: 35.3 g/dL (ref 30.0–36.0)
MCV: 93.1 fL (ref 80.0–100.0)
Monocytes Absolute: 0.4 10*3/uL (ref 0.1–1.0)
Monocytes Relative: 8 %
Neutro Abs: 2.9 10*3/uL (ref 1.7–7.7)
Neutrophils Relative %: 54 %
Platelet Count: 162 10*3/uL (ref 150–400)
RBC: 3.92 MIL/uL — ABNORMAL LOW (ref 4.22–5.81)
RDW: 12.7 % (ref 11.5–15.5)
WBC Count: 5.4 10*3/uL (ref 4.0–10.5)
nRBC: 0 % (ref 0.0–0.2)

## 2022-03-07 MED ORDER — DARATUMUMAB-HYALURONIDASE-FIHJ 1800-30000 MG-UT/15ML ~~LOC~~ SOLN
1800.0000 mg | Freq: Once | SUBCUTANEOUS | Status: AC
  Administered 2022-03-07: 1800 mg via SUBCUTANEOUS
  Filled 2022-03-07: qty 15

## 2022-03-07 MED ORDER — ACETAMINOPHEN 325 MG PO TABS
650.0000 mg | ORAL_TABLET | Freq: Once | ORAL | Status: AC
  Administered 2022-03-07: 650 mg via ORAL
  Filled 2022-03-07: qty 2

## 2022-03-07 MED ORDER — DIPHENHYDRAMINE HCL 25 MG PO CAPS
50.0000 mg | ORAL_CAPSULE | Freq: Once | ORAL | Status: AC
  Administered 2022-03-07: 50 mg via ORAL
  Filled 2022-03-07: qty 2

## 2022-03-07 MED ORDER — HEPARIN SOD (PORK) LOCK FLUSH 100 UNIT/ML IV SOLN
500.0000 [IU] | Freq: Once | INTRAVENOUS | Status: AC | PRN
  Administered 2022-03-07: 500 [IU]

## 2022-03-07 MED ORDER — SODIUM CHLORIDE 0.9% FLUSH
10.0000 mL | INTRAVENOUS | Status: DC | PRN
  Administered 2022-03-07: 10 mL

## 2022-03-07 MED ORDER — DEXAMETHASONE 4 MG PO TABS
20.0000 mg | ORAL_TABLET | Freq: Once | ORAL | Status: AC
  Administered 2022-03-07: 20 mg via ORAL
  Filled 2022-03-07: qty 5

## 2022-03-07 NOTE — Patient Instructions (Signed)
[facility] ONCOLOGY   Discharge Instructions: Thank you for choosing [facility] to provide your oncology and hematology care.   If you have a lab appointment with the [facility], please go directly to the [facility] and check in at the registration area.   Wear comfortable clothing and clothing appropriate for easy access to any Portacath or PICC line.   We strive to give you quality time with your provider. You may need to reschedule your appointment if you arrive late (15 or more minutes).  Arriving late affects you and other patients whose appointments are after yours.  Also, if you miss three or more appointments without notifying the office, you may be dismissed from the clinic at the provider's discretion.      For prescription refill requests, have your pharmacy contact our office and allow 72 hours for refills to be completed.    Today you received the following chemotherapy and/or immunotherapy agents: Daratumumab-hyaluronidase (Darazalex faspro)      To help prevent nausea and vomiting after your treatment, we encourage you to take your nausea medication as directed.  BELOW ARE SYMPTOMS THAT SHOULD BE REPORTED IMMEDIATELY: *FEVER GREATER THAN 100.4 F (38 C) OR HIGHER *CHILLS OR SWEATING *NAUSEA AND VOMITING THAT IS NOT CONTROLLED WITH YOUR NAUSEA MEDICATION *UNUSUAL SHORTNESS OF BREATH *UNUSUAL BRUISING OR BLEEDING *URINARY PROBLEMS (pain or burning when urinating, or frequent urination) *BOWEL PROBLEMS (unusual diarrhea, constipation, pain near the anus) TENDERNESS IN MOUTH AND THROAT WITH OR WITHOUT PRESENCE OF ULCERS (sore throat, sores in mouth, or a toothache) UNUSUAL RASH, SWELLING OR PAIN  UNUSUAL VAGINAL DISCHARGE OR ITCHING   Items with * indicate a potential emergency and should be followed up as soon as possible or go to the Emergency Department if any problems should occur.  Please show the CHEMOTHERAPY ALERT CARD or  IMMUNOTHERAPY ALERT CARD at check-in to the Emergency Department and triage nurse.  Should you have questions after your visit or need to cancel or reschedule your appointment, please contact [facility]  Dept: [phone]  and follow the prompts.  Office hours are 8:00 a.m. to 4:30 p.m. Monday - Friday. Please note that voicemails left after 4:00 p.m. may not be returned until the following business day.  We are closed weekends and major holidays. You have access to a nurse at all times for urgent questions. Please call the main number to the clinic Dept: [phone] and follow the prompts.   For any non-urgent questions, you may also contact your provider using MyChart. We now offer e-Visits for anyone 19 and older to request care online for non-urgent symptoms. For details visit [email or web address].   Also download the MyChart app! Go to the app store, search "MyChart", open the app, select [facility], and log in with your MyChart username and password.  Due to Covid, a mask is required upon entering the hospital/clinic. If you do not have a mask, one will be given to you upon arrival. For doctor visits, patients may have 1 support person aged 8 or older with them. For treatment visits, patients cannot have anyone with them due to current Covid guidelines and our immunocompromised population.

## 2022-03-07 NOTE — Progress Notes (Signed)
Per [name] - okay to proceed with treatment with elevated BP of 189/90.  Pt does not endorse any chest pain, headache or shortness of breath at this time.

## 2022-03-07 NOTE — Progress Notes (Signed)
Hematology and Oncology Follow Up Visit  Travis Nguyen [id number] [birth year] 71 y.o. 03/07/2022 8:11 AM [name], [name], MD   Principle Diagnosis: 71 year old man with IgG kappa multiple myeloma diagnosed in 2014.  He developed relapsed disease 2022.     Secondary diagnosis: Stage a T1c, Gleason score 3+4 = 7 prostate cancer that is currently in remission.  Prior Therapy: He was treated initially with Cytoxan, Velcade and Decadron and subsequently his regimen changed to a Velcade, Revlimid with dexamethasone.  He achieved remission at that time.   He is status post a robotic-assisted laparoscopic radical prostatectomy and bilateral lymph node dissection on 02/09/2014. The final pathology showed prostate adenocarcinoma Gleason score 4+3 equals 7 involving both lobes.    Carfilzomib, and dexamethasone, daratumumab started on July 28, 2020.  Dexamethasone and carfilzomib is weekly with daratumumab every 2 weeks.  Therapy concluded in December 2022.  Current therapy: He is currently receiving Darzalex Faspro on a monthly maintenance started in January 2023.  Interim History:  Mr. Pollack presents today for a follow-up evaluation.  Since last visit, he reports feeling well without any major complaints.  He tolerated daratumumab without any complaints.  He denies any nausea, vomiting or abdominal pain.  He denies any hospitalizations or illnesses.  He denies any worsening neuropathy or excessive fatigue.   Medications: Updated on review. Current Outpatient Medications  Medication Sig Dispense Refill   amLODipine (NORVASC) 10 MG tablet Take 1 tablet by mouth once a day (Patient taking differently: Take 10 mg by mouth daily.) 90 tablet 1   amLODipine (NORVASC) 5 MG tablet Take 1 tablet (5 mg total) by mouth daily. 90 tablet 1   doxycycline (VIBRAMYCIN) 100 MG capsule Take 1 capsule by mouth 2 times daily. 20 capsule 0   escitalopram (LEXAPRO) 10 MG tablet Take 1 tablet by  mouth once a day (Patient taking differently: Take 10 mg by mouth daily.) 90 tablet 1   escitalopram (LEXAPRO) 10 MG tablet Take 1 tablet by mouth once daily 90 tablet 1   escitalopram (LEXAPRO) 10 MG tablet Take 1 tablet (10 mg total) by mouth daily. 90 tablet 1   losartan (COZAAR) 100 MG tablet TAKE 1 TABLET BY MOUTH ONCE A DAY (Patient not taking: Reported on 07/17/2021) 90 tablet 0   losartan (COZAAR) 100 MG tablet Take 1 tablet by mouth once a day (Patient taking differently: Take 100 mg by mouth daily.) 90 tablet 1   losartan (COZAAR) 100 MG tablet Take 1 tablet by mouth daily 90 tablet 1   losartan (COZAAR) 100 MG tablet Take 1 tablet (100 mg total) by mouth daily. 90 tablet 1   oxyCODONE-acetaminophen (PERCOCET) 10-325 MG tablet Take 1 tablet by mouth 2 times a day (Patient taking differently: Take 1 tablet by mouth 2 (two) times daily as needed for pain.) 60 tablet 0   oxyCODONE-acetaminophen (PERCOCET) 10-325 MG tablet Take 1 tablet by mouth twice daily as needed (07/05/21) (Patient not taking: Reported on 07/17/2021) 60 tablet 0   oxyCODONE-acetaminophen (PERCOCET) 10-325 MG tablet Take 1 tablet by mouth 2 (two) times daily as needed. 60 tablet 0   oxyCODONE-acetaminophen (PERCOCET) 10-325 MG tablet Take 1 tablet by mouth 2 times a day as needed orally (ok to fill 30 days after last refill) 60 tablet 0   pantoprazole (PROTONIX) 20 MG tablet Take 1 tablet (20 mg total) by mouth daily. (Patient not taking: Reported on 07/17/2021) 14 tablet 0   vitamin C (ASCORBIC ACID) 500 MG  tablet Take 500 mg by mouth daily.     No current facility-administered medications for this visit.     Allergies:  Allergies  Allergen Reactions   Aspirin     Pt stated "raises BP"        Physical Exam:          Blood pressure (!) 189/90, pulse 62, temperature 97.7 F (36.5 C), temperature source Temporal, resp. rate 16, height _0  (1.854 m), weight 155 lb 3.2 oz (70.4 kg), SpO2 100  %.      ECOG: 1    General appearance: Comfortable appearing without any discomfort Head: Normocephalic without any trauma Oropharynx: Mucous membranes are moist and pink without any thrush or ulcers. Eyes: Pupils are equal and round reactive to light. Lymph nodes: No cervical, supraclavicular, inguinal or axillary lymphadenopathy.   Heart:regular rate and rhythm.  S1 and S2 without leg edema. Lung: Clear without any rhonchi or wheezes.  No dullness to percussion. Abdomin: Soft, nontender, nondistended with good bowel sounds.  No hepatosplenomegaly. Musculoskeletal: No joint deformity or effusion.  Full range of motion noted. Neurological: No deficits noted on motor, sensory and deep tendon reflex exam. Skin: No petechial rash or dryness.  Appeared moist.                     Lab Results: Lab Results  Component Value Date   WBC 3.9 (L) 12/11/2021   HGB 11.8 (L) 12/11/2021   HCT 34.3 (L) 12/11/2021   MCV 91.5 12/11/2021   PLT 215 12/11/2021   PSA 14.27 (H) 01/28/2013     Chemistry      Component Value Date/Time   NA 136 12/11/2021 0746   NA 138 03/27/2017 0838   K 4.4 12/11/2021 0746   K 4.3 03/27/2017 0838   CL 104 12/11/2021 0746   CL 105 01/01/2013 0835   CO2 28 12/11/2021 0746   CO2 24 03/27/2017 0838   BUN 12 12/11/2021 0746   BUN 23.7 03/27/2017 0838   CREATININE 0.91 12/11/2021 0746   CREATININE 1.2 03/27/2017 0838      Component Value Date/Time   CALCIUM 9.3 12/11/2021 0746   CALCIUM 9.9 03/27/2017 0838   ALKPHOS 69 12/11/2021 0746   ALKPHOS 79 03/27/2017 0838   AST 18 12/11/2021 0746   AST 25 03/27/2017 0838   ALT 16 12/11/2021 0746   ALT 26 03/27/2017 0838   BILITOT 0.5 12/11/2021 0746   BILITOT 0.31 03/27/2017 0838          Latest Reference Range & Units 06/29/21 12:41 07/13/21 07:55 08/03/21 10:44 09/17/21 08:22 10/15/21 08:04 11/12/21 08:22  Kappa free light chain 3.3 - 19.4 mg/L 14.4 13.1 17.2 20.1 (H) 19.8 (H) 19.8 (H)   Lambda free light chains 5.7 - 26.3 mg/L 8.7 2.9 (L) 3.6 (L) 4.3 (L) 3.8 (L) 3.0 (L)  Kappa, lambda light chain ratio 0.26 - 1.65  1.66 (H) 4.52 (H) 4.78 (H) 4.67 (H) 5.21 (H) 6.60 (H)  (H): Data is abnormally high (L): Data is abnormally low   Impression and Plan:  71 year old man with:   1.  IgG kappa relapsed multiple myeloma diagnosed in 2014.  He subsequently developed relapsed disease in 2022.   Risks and benefits of continuing current therapy were discussed at this time.  He is currently on daratumumab maintenance with protein studies continue to show disease and control.  Protein studies obtained in May 2023 continues to show residual disease although no evidence of endorgan  damage.  He is agreeable to continue at this time.   2.  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  Hemoglobin today updated currently at 12.9.   3.  IV access: Port-A-Cath continue to be in use and flush with clinic.   4.  Antiemetics: As he is available to him without any nausea or vomiting.   5.  VZV prophylaxis: No reactivation noted currently on acyclovir.  6. Followup: He will return in 4 weeks for a follow-up.   30  minutes were spent on this encounter.  The time was dedicated to reviewing laboratory data, disease status update and outlining future plan of care   Zola Button, MD 8/24/20238:11 AM

## 2022-03-08 ENCOUNTER — Other Ambulatory Visit: Payer: Self-pay

## 2022-03-08 LAB — KAPPA/LAMBDA LIGHT CHAINS
Kappa free light chain: 23.2 mg/L — ABNORMAL HIGH (ref 3.3–19.4)
Kappa, lambda light chain ratio: 6.27 — ABNORMAL HIGH (ref 0.26–1.65)
Lambda free light chains: 3.7 mg/L — ABNORMAL LOW (ref 5.7–26.3)

## 2022-03-11 LAB — MULTIPLE MYELOMA PANEL, SERUM
Albumin SerPl Elph-Mcnc: 4.1 g/dL (ref 2.9–4.4)
Albumin/Glob SerPl: 1.5 (ref 0.7–1.7)
Alpha 1: 0.2 g/dL (ref 0.0–0.4)
Alpha2 Glob SerPl Elph-Mcnc: 0.7 g/dL (ref 0.4–1.0)
B-Globulin SerPl Elph-Mcnc: 0.8 g/dL (ref 0.7–1.3)
Gamma Glob SerPl Elph-Mcnc: 1.1 g/dL (ref 0.4–1.8)
Globulin, Total: 2.8 g/dL (ref 2.2–3.9)
IgA: 29 mg/dL — ABNORMAL LOW (ref 61–437)
IgG (Immunoglobin G), Serum: 1286 mg/dL (ref 603–1613)
IgM (Immunoglobulin M), Srm: 21 mg/dL (ref 20–172)
M Protein SerPl Elph-Mcnc: 0.9 g/dL — ABNORMAL HIGH
Total Protein ELP: 6.9 g/dL (ref 6.0–8.5)

## 2022-03-15 ENCOUNTER — Telehealth: Payer: Self-pay | Admitting: Oncology

## 2022-03-15 ENCOUNTER — Encounter (HOSPITAL_COMMUNITY): Payer: Self-pay | Admitting: Emergency Medicine

## 2022-03-15 ENCOUNTER — Other Ambulatory Visit: Payer: Self-pay

## 2022-03-15 ENCOUNTER — Emergency Department (HOSPITAL_COMMUNITY)
Admission: EM | Admit: 2022-03-15 | Discharge: 2022-03-15 | Disposition: A | Discharge status: Home / Self Care | Payer: Medicare Other | Attending: Emergency Medicine | Admitting: Emergency Medicine

## 2022-03-15 DIAGNOSIS — I10 Essential (primary) hypertension: Secondary | ICD-10-CM | POA: Diagnosis not present

## 2022-03-15 DIAGNOSIS — K5900 Constipation, unspecified: Secondary | ICD-10-CM | POA: Insufficient documentation

## 2022-03-15 DIAGNOSIS — Z79899 Other long term (current) drug therapy: Secondary | ICD-10-CM | POA: Diagnosis not present

## 2022-03-15 LAB — CBC WITH DIFFERENTIAL/PLATELET
Abs Immature Granulocytes: 0.01 10*3/uL (ref 0.00–0.07)
Basophils Absolute: 0 10*3/uL (ref 0.0–0.1)
Basophils Relative: 0 %
Eosinophils Absolute: 0 10*3/uL (ref 0.0–0.5)
Eosinophils Relative: 1 %
HCT: 37.2 % — ABNORMAL LOW (ref 39.0–52.0)
Hemoglobin: 12.9 g/dL — ABNORMAL LOW (ref 13.0–17.0)
Immature Granulocytes: 0 %
Lymphocytes Relative: 39 %
Lymphs Abs: 1.8 10*3/uL (ref 0.7–4.0)
MCH: 32.7 pg (ref 26.0–34.0)
MCHC: 34.7 g/dL (ref 30.0–36.0)
MCV: 94.2 fL (ref 80.0–100.0)
Monocytes Absolute: 0.3 10*3/uL (ref 0.1–1.0)
Monocytes Relative: 7 %
Neutro Abs: 2.5 10*3/uL (ref 1.7–7.7)
Neutrophils Relative %: 53 %
Platelets: 181 10*3/uL (ref 150–400)
RBC: 3.95 MIL/uL — ABNORMAL LOW (ref 4.22–5.81)
RDW: 12.4 % (ref 11.5–15.5)
WBC: 4.7 10*3/uL (ref 4.0–10.5)
nRBC: 0 % (ref 0.0–0.2)

## 2022-03-15 LAB — URINALYSIS, ROUTINE W REFLEX MICROSCOPIC
Bilirubin Urine: NEGATIVE
Glucose, UA: NEGATIVE mg/dL
Hgb urine dipstick: NEGATIVE
Ketones, ur: NEGATIVE mg/dL
Leukocytes,Ua: NEGATIVE
Nitrite: NEGATIVE
Protein, ur: NEGATIVE mg/dL
Specific Gravity, Urine: 1.01 (ref 1.005–1.030)
pH: 5 (ref 5.0–8.0)

## 2022-03-15 LAB — COMPREHENSIVE METABOLIC PANEL
ALT: 22 U/L (ref 0–44)
AST: 19 U/L (ref 15–41)
Albumin: 3.9 g/dL (ref 3.5–5.0)
Alkaline Phosphatase: 93 U/L (ref 38–126)
Anion gap: 7 (ref 5–15)
BUN: 11 mg/dL (ref 8–23)
CO2: 24 mmol/L (ref 22–32)
Calcium: 8.9 mg/dL (ref 8.9–10.3)
Chloride: 105 mmol/L (ref 98–111)
Creatinine, Ser: 0.93 mg/dL (ref 0.61–1.24)
GFR, Estimated: >60 mL/min (ref >60)
Glucose, Bld: 106 mg/dL — ABNORMAL HIGH (ref 70–99)
Potassium: 4.1 mmol/L (ref 3.5–5.1)
Sodium: 136 mmol/L (ref 135–145)
Total Bilirubin: 0.3 mg/dL (ref 0.3–1.2)
Total Protein: 6.6 g/dL (ref 6.5–8.1)

## 2022-03-15 LAB — LIPASE, BLOOD: Lipase: 42 U/L (ref 11–51)

## 2022-03-15 MED ORDER — SENNOSIDES-DOCUSATE SODIUM 8.6-50 MG PO TABS
1.0000 | ORAL_TABLET | Freq: Every day | ORAL | 0 refills | Status: AC
Start: 2022-03-15 — End: 2022-03-29

## 2022-03-15 MED ORDER — DOCUSATE SODIUM 100 MG PO CAPS
100.0000 mg | ORAL_CAPSULE | Freq: Once | ORAL | Status: AC
  Administered 2022-03-15: 100 mg via ORAL
  Filled 2022-03-15: qty 1

## 2022-03-15 NOTE — ED Triage Notes (Signed)
Patient complains of constipation over the last several weeks. Reports using stool softeners and stimulant laxatives to aid with bowel movements during that same time with improvement in symptoms but states he is concerned that if he continues to use those medications he will become dependent on them. Denies nausea, denies pain.

## 2022-03-15 NOTE — ED Provider Notes (Signed)
[facility] EMERGENCY DEPARTMENT Provider Note   CSN: [id number] Arrival date & time: 03/15/22  1716     History  Chief Complaint  Patient presents with   Constipation    Travis Nguyen is a 71 y.o. male.  Patient is here with constipation.  History of chronic back pain.  On opioids.  Denies any nausea, vomiting.  No abdominal pain.  History of hypertension as well.  Nothing makes it worse or better.  He is not having any problems going to the bathroom from a urination standpoint.  No weakness or numbness in his legs.  He has been using some over-the-counter laxatives and MiraLAX without much effect.  The history is provided by the patient.       Home Medications Prior to Admission medications   Medication Sig Start Date End Date Taking? Authorizing Provider  senna-docusate (SENOKOT-S) 8.6-50 MG tablet Take 1 tablet by mouth daily for 14 days. 03/15/22 03/29/22 Yes Corayma Cashatt, DO  amLODipine (NORVASC) 10 MG tablet Take 1 tablet by mouth once a day Patient taking differently: Take 10 mg by mouth daily. 04/24/21     amLODipine (NORVASC) 5 MG tablet Take 1 tablet (5 mg total) by mouth daily. 02/25/22     doxycycline (VIBRAMYCIN) 100 MG capsule Take 1 capsule by mouth 2 times daily. 07/17/21   Smoot, Sarah A, PA-C  escitalopram (LEXAPRO) 10 MG tablet Take 1 tablet by mouth once a day Patient taking differently: Take 10 mg by mouth daily. 04/24/21     escitalopram (LEXAPRO) 10 MG tablet Take 1 tablet by mouth once daily 07/24/21     escitalopram (LEXAPRO) 10 MG tablet Take 1 tablet (10 mg total) by mouth daily. 02/25/22     losartan (COZAAR) 100 MG tablet TAKE 1 TABLET BY MOUTH ONCE A DAY Patient not taking: Reported on 07/17/2021 06/06/20 06/06/21  [name], MD  losartan (COZAAR) 100 MG tablet Take 1 tablet by mouth once a day Patient taking differently: Take 100 mg by mouth daily. 04/24/21     losartan (COZAAR) 100 MG tablet Take 1 tablet by mouth daily  07/24/21     losartan (COZAAR) 100 MG tablet Take 1 tablet (100 mg total) by mouth daily. 02/25/22     oxyCODONE-acetaminophen (PERCOCET) 10-325 MG tablet Take 1 tablet by mouth 2 times a day Patient taking differently: Take 1 tablet by mouth 2 (two) times daily as needed for pain. 12/15/20     oxyCODONE-acetaminophen (PERCOCET) 10-325 MG tablet Take 1 tablet by mouth twice daily as needed (07/05/21) Patient not taking: Reported on 07/17/2021 07/02/21     oxyCODONE-acetaminophen (PERCOCET) 10-325 MG tablet Take 1 tablet by mouth 2 (two) times daily as needed. 08/03/21     oxyCODONE-acetaminophen (PERCOCET) 10-325 MG tablet Take 1 tablet by mouth 2 times a day as needed orally (ok to fill 30 days after last refill) 02/25/22     pantoprazole (PROTONIX) 20 MG tablet Take 1 tablet (20 mg total) by mouth daily. Patient not taking: Reported on 07/17/2021 01/07/21   Quintella Reichert, MD  vitamin C (ASCORBIC ACID) 500 MG tablet Take 500 mg by mouth daily.    [provider]      Allergies    Aspirin    Review of Systems   Review of Systems  Physical Exam Updated Vital Signs BP (!) 174/89 (BP Location: Right Arm)   Pulse 78   Temp 99.2 F (37.3 C) (Oral)   Resp 16  SpO2 99%  Physical Exam Vitals and nursing note reviewed.  Constitutional:      General: He is not in acute distress.    Appearance: He is well-developed.  HENT:     Head: Normocephalic and atraumatic.  Eyes:     Conjunctiva/sclera: Conjunctivae normal.  Cardiovascular:     Rate and Rhythm: Normal rate and regular rhythm.     Pulses: Normal pulses.     Heart sounds: No murmur heard. Pulmonary:     Effort: Pulmonary effort is normal. No respiratory distress.     Breath sounds: Normal breath sounds.  Abdominal:     Palpations: Abdomen is soft.     Tenderness: There is no abdominal tenderness.  Musculoskeletal:        General: No swelling.     Cervical back: Neck supple.  Skin:    General: Skin is warm and dry.      Capillary Refill: Capillary refill takes less than 2 seconds.  Neurological:     Mental Status: He is alert.  Psychiatric:        Mood and Affect: Mood normal.     ED Results / Procedures / Treatments   Labs (all labs ordered are listed, but only abnormal results are displayed) Labs Reviewed  CBC WITH DIFFERENTIAL/PLATELET - Abnormal; Notable for the following components:      Result Value   RBC 3.95 (*)    Hemoglobin 12.9 (*)    HCT 37.2 (*)    All other components within normal limits  COMPREHENSIVE METABOLIC PANEL - Abnormal; Notable for the following components:   Glucose, Bld 106 (*)    All other components within normal limits  LIPASE, BLOOD  URINALYSIS, ROUTINE W REFLEX MICROSCOPIC    EKG None  Radiology No results found.  Procedures Procedures    Medications Ordered in ED Medications - No data to display  ED Course/ Medical Decision Making/ A&P                           Medical Decision Making Risk OTC drugs.   [name] is here with constipation.  Normal vitals.  No fever.  History of chronic opioid use, hypertension, multiple myeloma.  He has no abdominal pain.  No back pain.  No concern for cauda equina or other acute emergency process.  He has no abdominal tenderness.  No nausea or vomiting.  I have no concern for bowel obstruction or fecal impaction or other acute process.  It does not seem like he is using enough MiraLAX and overall educated him about MiraLAX use.  Will prescribe docusate as well.  Overall he had basic blood work done prior to my evaluation with CBC and CMP and overall were unremarkable per my review and interpretation.  Recommend follow-up with primary care doctor.  Discharged in good condition.  This chart was dictated using voice recognition software.  Despite best efforts to proofread,  errors can occur which can change the documentation meaning.         Final Clinical Impression(s) / ED Diagnoses Final diagnoses:   Constipation, unspecified constipation type    Rx / DC Orders ED Discharge Orders          Ordered    senna-docusate (SENOKOT-S) 8.6-50 MG tablet  Daily        03/15/22 1937              Lennice Sites, DO 03/15/22 1940

## 2022-03-15 NOTE — ED Provider Triage Note (Signed)
Emergency Medicine Provider Triage Evaluation Note  GARWOOD WENTZELL , a 71 y.o. male  was evaluated in triage.  Pt complains of constipation for several weeks. Has been using miralax and laxatives and had a bowel movement yesterday but reports he does not want to become dependent on it.   Review of Systems  Positive: Constipation Negative: Nausea, vomiting, blood in stool  Physical Exam  BP (!) 174/89 (BP Location: Right Arm)   Pulse 78   Temp 99.2 F (37.3 C) (Oral)   Resp 16   SpO2 99%  Gen:   Awake, no distress   Resp:  Normal effort  MSK:   Moves extremities without difficulty  Other:  Abd Soft, nontender.   Medical Decision Making  Medically screening exam initiated at 5:23 PM.  Appropriate orders placed.  [name] was informed that the remainder of the evaluation will be completed by another provider, this initial triage assessment does not replace that evaluation, and the importance of remaining in the ED until their evaluation is complete.     Janeece Fitting, PA-C 03/15/22 1732

## 2022-03-15 NOTE — Discharge Instructions (Signed)
I have prescribed you a medication to help with your constipation.  If you are unable to afford this medicine I recommend just sticking to MiraLAX.  Recommend using 3-4 capfuls of MiraLAX in about 30 to 40 ounces of water and you can titrate the MiraLAX to effect.  Recommend that you use a least 1 capful of MiraLAX a day or titrate to the amount that allows you to have a nice easy bowel movement every several days.  Follow-up with your primary care doctor.

## 2022-03-15 NOTE — Telephone Encounter (Signed)
Scheduled per 08/24 los, patient has been called but both phone numbers are unreliable. Calender will be mailed.

## 2022-03-16 ENCOUNTER — Other Ambulatory Visit (HOSPITAL_COMMUNITY): Payer: Self-pay

## 2022-03-21 ENCOUNTER — Other Ambulatory Visit: Payer: Self-pay

## 2022-03-29 ENCOUNTER — Other Ambulatory Visit (HOSPITAL_COMMUNITY): Payer: Self-pay

## 2022-03-29 MED ORDER — OXYCODONE-ACETAMINOPHEN 10-325 MG PO TABS
1.0000 | ORAL_TABLET | Freq: Two times a day (BID) | ORAL | 0 refills | Status: DC | PRN
  Filled 2022-03-30 – 2022-04-01 (×3): qty 60, 30d supply, fill #0

## 2022-03-30 ENCOUNTER — Other Ambulatory Visit (HOSPITAL_COMMUNITY): Payer: Self-pay

## 2022-04-01 ENCOUNTER — Other Ambulatory Visit (HOSPITAL_COMMUNITY): Payer: Self-pay

## 2022-04-04 ENCOUNTER — Inpatient Hospital Stay (HOSPITAL_BASED_OUTPATIENT_CLINIC_OR_DEPARTMENT_OTHER): Payer: Medicare Other | Admitting: Oncology

## 2022-04-04 ENCOUNTER — Inpatient Hospital Stay: Payer: Medicare Other

## 2022-04-04 ENCOUNTER — Other Ambulatory Visit: Payer: Self-pay

## 2022-04-04 ENCOUNTER — Inpatient Hospital Stay: Payer: Medicare Other | Attending: Oncology

## 2022-04-04 VITALS — BP 145/87 | HR 58 | Temp 98.6°F | Resp 16

## 2022-04-04 VITALS — BP 182/93 | HR 56 | Temp 98.4°F | Resp 18 | Wt 155.5 lb

## 2022-04-04 DIAGNOSIS — D649 Anemia, unspecified: Secondary | ICD-10-CM | POA: Insufficient documentation

## 2022-04-04 DIAGNOSIS — C9001 Multiple myeloma in remission: Secondary | ICD-10-CM

## 2022-04-04 DIAGNOSIS — Z886 Allergy status to analgesic agent status: Secondary | ICD-10-CM | POA: Diagnosis not present

## 2022-04-04 DIAGNOSIS — C9002 Multiple myeloma in relapse: Secondary | ICD-10-CM | POA: Insufficient documentation

## 2022-04-04 DIAGNOSIS — Z5112 Encounter for antineoplastic immunotherapy: Secondary | ICD-10-CM | POA: Diagnosis not present

## 2022-04-04 DIAGNOSIS — Z7969 Long term (current) use of other immunomodulators and immunosuppressants: Secondary | ICD-10-CM | POA: Diagnosis not present

## 2022-04-04 DIAGNOSIS — I10 Essential (primary) hypertension: Secondary | ICD-10-CM | POA: Diagnosis not present

## 2022-04-04 DIAGNOSIS — Z95828 Presence of other vascular implants and grafts: Secondary | ICD-10-CM

## 2022-04-04 DIAGNOSIS — Z79899 Other long term (current) drug therapy: Secondary | ICD-10-CM | POA: Insufficient documentation

## 2022-04-04 DIAGNOSIS — C61 Malignant neoplasm of prostate: Secondary | ICD-10-CM

## 2022-04-04 LAB — CMP (CANCER CENTER ONLY)
ALT: 34 U/L (ref 0–44)
AST: 27 U/L (ref 15–41)
Albumin: 4.3 g/dL (ref 3.5–5.0)
Alkaline Phosphatase: 95 U/L (ref 38–126)
Anion gap: 3 — ABNORMAL LOW (ref 5–15)
BUN: 12 mg/dL (ref 8–23)
CO2: 27 mmol/L (ref 22–32)
Calcium: 9.2 mg/dL (ref 8.9–10.3)
Chloride: 106 mmol/L (ref 98–111)
Creatinine: 0.84 mg/dL (ref 0.61–1.24)
GFR, Estimated: >60 mL/min (ref >60)
Glucose, Bld: 91 mg/dL (ref 70–99)
Potassium: 4.4 mmol/L (ref 3.5–5.1)
Sodium: 136 mmol/L (ref 135–145)
Total Bilirubin: 0.5 mg/dL (ref 0.3–1.2)
Total Protein: 7.1 g/dL (ref 6.5–8.1)

## 2022-04-04 LAB — CBC WITH DIFFERENTIAL (CANCER CENTER ONLY)
Abs Immature Granulocytes: 0.01 10*3/uL (ref 0.00–0.07)
Basophils Absolute: 0 10*3/uL (ref 0.0–0.1)
Basophils Relative: 0 %
Eosinophils Absolute: 0 10*3/uL (ref 0.0–0.5)
Eosinophils Relative: 1 %
HCT: 36.5 % — ABNORMAL LOW (ref 39.0–52.0)
Hemoglobin: 12.4 g/dL — ABNORMAL LOW (ref 13.0–17.0)
Immature Granulocytes: 0 %
Lymphocytes Relative: 45 %
Lymphs Abs: 2 10*3/uL (ref 0.7–4.0)
MCH: 32.6 pg (ref 26.0–34.0)
MCHC: 34 g/dL (ref 30.0–36.0)
MCV: 96.1 fL (ref 80.0–100.0)
Monocytes Absolute: 0.4 10*3/uL (ref 0.1–1.0)
Monocytes Relative: 9 %
Neutro Abs: 2 10*3/uL (ref 1.7–7.7)
Neutrophils Relative %: 45 %
Platelet Count: 158 10*3/uL (ref 150–400)
RBC: 3.8 MIL/uL — ABNORMAL LOW (ref 4.22–5.81)
RDW: 12.6 % (ref 11.5–15.5)
WBC Count: 4.5 10*3/uL (ref 4.0–10.5)
nRBC: 0 % (ref 0.0–0.2)

## 2022-04-04 MED ORDER — DEXAMETHASONE 4 MG PO TABS
20.0000 mg | ORAL_TABLET | Freq: Once | ORAL | Status: AC
  Administered 2022-04-04: 20 mg via ORAL
  Filled 2022-04-04: qty 5

## 2022-04-04 MED ORDER — SODIUM CHLORIDE 0.9% FLUSH
10.0000 mL | INTRAVENOUS | Status: DC | PRN
  Administered 2022-04-04: 10 mL

## 2022-04-04 MED ORDER — HEPARIN SOD (PORK) LOCK FLUSH 100 UNIT/ML IV SOLN
500.0000 [IU] | Freq: Once | INTRAVENOUS | Status: AC | PRN
  Administered 2022-04-04: 500 [IU] via INTRAVENOUS

## 2022-04-04 MED ORDER — DIPHENHYDRAMINE HCL 25 MG PO CAPS
50.0000 mg | ORAL_CAPSULE | Freq: Once | ORAL | Status: AC
  Administered 2022-04-04: 50 mg via ORAL
  Filled 2022-04-04: qty 2

## 2022-04-04 MED ORDER — ACETAMINOPHEN 325 MG PO TABS
650.0000 mg | ORAL_TABLET | Freq: Once | ORAL | Status: AC
  Administered 2022-04-04: 650 mg via ORAL
  Filled 2022-04-04: qty 2

## 2022-04-04 MED ORDER — DARATUMUMAB-HYALURONIDASE-FIHJ 1800-30000 MG-UT/15ML ~~LOC~~ SOLN
1800.0000 mg | Freq: Once | SUBCUTANEOUS | Status: AC
  Administered 2022-04-04: 1800 mg via SUBCUTANEOUS
  Filled 2022-04-04: qty 15

## 2022-04-04 NOTE — Progress Notes (Signed)
Hematology and Oncology Follow Up Visit  Travis Nguyen [id number] 1951-02-13 71 y.o. 04/04/2022 8:08 AM Travis Nguyen, MDShadad, Travis Dad, MD   Principle Diagnosis: 77 year old man with relapsed IgG kappa multiple myeloma noted in 2022.  He was initially diagnosed in 2014.    Secondary diagnosis: Stage a T1c, Gleason score 3+4 = 7 prostate cancer that is currently in remission.  Prior Therapy: He was treated initially with Cytoxan, Velcade and Decadron and subsequently his regimen changed to a Velcade, Revlimid with dexamethasone.  He achieved remission at that time.   He is status post a robotic-assisted laparoscopic radical prostatectomy and bilateral lymph node dissection on 02/09/2014. The final pathology showed prostate adenocarcinoma Gleason score 4+3 equals 7 involving both lobes.    Carfilzomib, and dexamethasone, daratumumab started on July 28, 2020.  Dexamethasone and carfilzomib is weekly with daratumumab every 2 weeks.  Therapy concluded in December 2022.  Current therapy: He is currently receiving Darzalex Faspro on a monthly maintenance started in January 2023.  Interim History:  Travis Nguyen is here for a follow-up.  Since last visit, he reports no major changes in his health.  He denies chest pain shortness of breath or difficulty breathing.  He denies any recent hospitalizations or illnesses.  He denies any headaches or blurry vision.  His performance status quality of life remains unchanged.   Medications: Reviewed without changes. Current Outpatient Medications  Medication Sig Dispense Refill   amLODipine (NORVASC) 10 MG tablet Take 1 tablet by mouth once a day (Patient taking differently: Take 10 mg by mouth daily.) 90 tablet 1   amLODipine (NORVASC) 5 MG tablet Take 1 tablet (5 mg total) by mouth daily. 90 tablet 1   doxycycline (VIBRAMYCIN) 100 MG capsule Take 1 capsule by mouth 2 times daily. 20 capsule 0   escitalopram (LEXAPRO) 10 MG tablet Take 1 tablet  by mouth once a day (Patient taking differently: Take 10 mg by mouth daily.) 90 tablet 1   escitalopram (LEXAPRO) 10 MG tablet Take 1 tablet by mouth once daily 90 tablet 1   escitalopram (LEXAPRO) 10 MG tablet Take 1 tablet (10 mg total) by mouth daily. 90 tablet 1   losartan (COZAAR) 100 MG tablet TAKE 1 TABLET BY MOUTH ONCE A DAY (Patient not taking: Reported on 07/17/2021) 90 tablet 0   losartan (COZAAR) 100 MG tablet Take 1 tablet by mouth once a day (Patient taking differently: Take 100 mg by mouth daily.) 90 tablet 1   losartan (COZAAR) 100 MG tablet Take 1 tablet by mouth daily 90 tablet 1   losartan (COZAAR) 100 MG tablet Take 1 tablet (100 mg total) by mouth daily. 90 tablet 1   oxyCODONE-acetaminophen (PERCOCET) 10-325 MG tablet Take 1 tablet by mouth 2 times a day (Patient taking differently: Take 1 tablet by mouth 2 (two) times daily as needed for pain.) 60 tablet 0   oxyCODONE-acetaminophen (PERCOCET) 10-325 MG tablet Take 1 tablet by mouth twice daily as needed (07/05/21) (Patient not taking: Reported on 07/17/2021) 60 tablet 0   oxyCODONE-acetaminophen (PERCOCET) 10-325 MG tablet Take 1 tablet by mouth 2 (two) times daily as needed. 60 tablet 0   oxyCODONE-acetaminophen (PERCOCET) 10-325 MG tablet Take 1 tablet by mouth 2 times a day as needed orally (ok to fill 30 days after last refill) 60 tablet 0   oxyCODONE-acetaminophen (PERCOCET) 10-325 MG tablet Take 1 tablet by mouth 2 (two) times daily as needed (30 day supply). 60 tablet 0   pantoprazole (PROTONIX)  20 MG tablet Take 1 tablet (20 mg total) by mouth daily. (Patient not taking: Reported on 07/17/2021) 14 tablet 0   vitamin C (ASCORBIC ACID) 500 MG tablet Take 500 mg by mouth daily.     No current facility-administered medications for this visit.     Allergies:  Allergies  Allergen Reactions   Aspirin     Pt stated "raises BP"        Physical Exam:  Blood pressure (!) 182/93, pulse (!) 56, temperature 98.4 F (36.9  C), temperature source Oral, resp. rate 18, weight 155 lb 8 oz (70.5 kg), SpO2 100 %.   ECOG: 1    General appearance: Alert, awake without any distress. Head: Atraumatic without abnormalities Oropharynx: Without any thrush or ulcers. Eyes: No scleral icterus. Lymph nodes: No lymphadenopathy noted in the cervical, supraclavicular, or axillary nodes Heart:regular rate and rhythm, without any murmurs or gallops.   Lung: Clear to auscultation without any rhonchi, wheezes or dullness to percussion. Abdomin: Soft, nontender without any shifting dullness or ascites. Musculoskeletal: No clubbing or cyanosis. Neurological: No motor or sensory deficits. Skin: No rashes or lesions.                      Lab Results: Lab Results  Component Value Date   WBC 4.7 03/15/2022   HGB 12.9 (L) 03/15/2022   HCT 37.2 (L) 03/15/2022   MCV 94.2 03/15/2022   PLT 181 03/15/2022   PSA 14.27 (H) 01/28/2013     Chemistry      Component Value Date/Time   NA 136 03/15/2022 1738   NA 138 03/27/2017 0838   K 4.1 03/15/2022 1738   K 4.3 03/27/2017 0838   CL 105 03/15/2022 1738   CL 105 01/01/2013 0835   CO2 24 03/15/2022 1738   CO2 24 03/27/2017 0838   BUN 11 03/15/2022 1738   BUN 23.7 03/27/2017 0838   CREATININE 0.93 03/15/2022 1738   CREATININE 0.80 03/07/2022 0828   CREATININE 1.2 03/27/2017 0838      Component Value Date/Time   CALCIUM 8.9 03/15/2022 1738   CALCIUM 9.9 03/27/2017 0838   ALKPHOS 93 03/15/2022 1738   ALKPHOS 79 03/27/2017 0838   AST 19 03/15/2022 1738   AST 32 03/07/2022 0828   AST 25 03/27/2017 0838   ALT 22 03/15/2022 1738   ALT 75 (H) 03/07/2022 0828   ALT 26 03/27/2017 0838   BILITOT 0.3 03/15/2022 1738   BILITOT 0.5 03/07/2022 0828   BILITOT 0.31 03/27/2017 0838       Latest Reference Range & Units 11/12/21 08:22 03/07/22 08:28  IgG (Immunoglobin G), Serum 603 - 1,613 mg/dL 1,116 1,286  IgM (Immunoglobulin M), Srm 20 - 172 mg/dL 26 21  IgA  61 - 437 mg/dL 27 (L) 29 (L)  (L): Data is abnormally low   Latest Reference Range & Units 11/12/21 08:22 03/07/22 08:28  Kappa free light chain 3.3 - 19.4 mg/L 19.8 (H) 23.2 (H)  Lambda free light chains 5.7 - 26.3 mg/L 3.0 (L) 3.7 (L)  Kappa, lambda light chain ratio 0.26 - 1.65  6.60 (H) 6.27 (H)  (H): Data is abnormally high (L): Data is abnormally low  Impression and Plan:  71 year old man with:   1.  Multiple myeloma diagnosed with relapsed disease in 2022.  He had initially presented with IgG kappa in 2014.   He is currently on maintenance daratumumab without any major complications.  Risks and benefits of continuing this  treatment were discussed at this time.  Protein studies obtained on March 07, 2022 continues to show partial response with normalization of his quantitative immunoglobulins and stable kappa to lambda ratio.  Different salvage therapy options were discussed and will be deferred unless he has symptomatic relapsed disease.  He is agreeable to proceed.   2.  Anemia: His hemoglobin is close to normal range without any need for transfusion.  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  3.  IV access: Port-A-Cath remains in place and will be used for subsequent treatments.   4.  Antiemetics: No nausea or vomiting reported at this time.  Compazine is available to him.   5.  VZV prophylaxis: I recommended continuing acyclovir for the time being.  6.  Hypertension: His blood pressure is elevated today but is usually normal between visits.  He is asymptomatic.  7. Followup: In 4 weeks for a follow-up.   30  minutes were dedicated to this visit.  The time was spent on updating his disease status, treatment choices and outlining future plan of care discussion.   Zola Button, MD 9/21/20238:08 AM

## 2022-04-04 NOTE — Patient Instructions (Addendum)
[facility]   Discharge Instructions: Thank you for choosing [facility] to provide your oncology and hematology care.   If you have a lab appointment with the [facility], please go directly to the [facility] and check in at the registration area.   Wear comfortable clothing and clothing appropriate for easy access to any Portacath or PICC line.   We strive to give you quality time with your provider. You may need to reschedule your appointment if you arrive late (15 or more minutes).  Arriving late affects you and other patients whose appointments are after yours.  Also, if you miss three or more appointments without notifying the office, you may be dismissed from the clinic at the provider's discretion.      For prescription refill requests, have your pharmacy contact our office and allow 72 hours for refills to be completed.    Today you received the following chemotherapy and/or immunotherapy agents: Daratumumab-hyaluronidase (Darazalex faspro)      To help prevent nausea and vomiting after your treatment, we encourage you to take your nausea medication as directed.  BELOW ARE SYMPTOMS THAT SHOULD BE REPORTED IMMEDIATELY: *FEVER GREATER THAN 100.4 F (38 C) OR HIGHER *CHILLS OR SWEATING *NAUSEA AND VOMITING THAT IS NOT CONTROLLED WITH YOUR NAUSEA MEDICATION *UNUSUAL SHORTNESS OF BREATH *UNUSUAL BRUISING OR BLEEDING *URINARY PROBLEMS (pain or burning when urinating, or frequent urination) *BOWEL PROBLEMS (unusual diarrhea, constipation, pain near the anus) TENDERNESS IN MOUTH AND THROAT WITH OR WITHOUT PRESENCE OF ULCERS (sore throat, sores in mouth, or a toothache) UNUSUAL RASH, SWELLING OR PAIN  UNUSUAL VAGINAL DISCHARGE OR ITCHING   Items with * indicate a potential emergency and should be followed up as soon as possible or go to the Emergency Department if any problems should occur.  Please show the CHEMOTHERAPY ALERT CARD or  IMMUNOTHERAPY ALERT CARD at check-in to the Emergency Department and triage nurse.  Should you have questions after your visit or need to cancel or reschedule your appointment, please contact [facility]  Dept: [phone]  and follow the prompts.  Office hours are 8:00 a.m. to 4:30 p.m. Monday - Friday. Please note that voicemails left after 4:00 p.m. may not be returned until the following business day.  We are closed weekends and major holidays. You have access to a nurse at all times for urgent questions. Please call the main number to the clinic Dept: [phone] and follow the prompts.   For any non-urgent questions, you may also contact your provider using MyChart. We now offer e-Visits for anyone 69 and older to request care online for non-urgent symptoms. For details visit [email or web address].   Also download the MyChart app! Go to the app store, search "MyChart", open the app, select [facility], and log in with your MyChart username and password.  Due to Covid, a mask is required upon entering the hospital/clinic. If you do not have a mask, one will be given to you upon arrival. For doctor visits, patients may have 1 support person aged 67 or older with them. For treatment visits, patients cannot have anyone with them due to current Covid guidelines and our immunocompromised population.   Daratumumab; Hyaluronidase Injection What is this medication? DARATUMUMAB; HYALURONIDASE (dar a toom ue mab; hye al ur ON i dase) treats multiple myeloma, a type of bone marrow cancer. Daratumumab works by blocking a protein that causes cancer cells to grow and multiply. This helps to  slow or stop the spread of cancer cells. Hyaluronidase works by increasing the absorption of other medications in the body to help them work better. This medication may also be used treat amyloidosis, a condition that causes the buildup of a protein (amyloid) in your body. It works by  reducing the buildup of this protein, which decreases symptoms. It is a combination medication that contains a monoclonal antibody. This medicine may be used for other purposes; ask your health care provider or pharmacist if you have questions. COMMON BRAND NAME(S): DARZALEX FASPRO What should I tell my care team before I take this medication? They need to know if you have any of these conditions: Heart disease Infection, such as chickenpox, cold sores, herpes, hepatitis B Lung or breathing disease An unusual or allergic reaction to daratumumab, hyaluronidase, other medications, foods, dyes, or preservatives Pregnant or trying to get pregnant Breast-feeding How should I use this medication? This medication is injected under the skin. It is given by your care team in a hospital or clinic setting. Talk to your care team about the use of this medication in children. Special care may be needed. Overdosage: If you think you have taken too much of this medicine contact a poison control center or emergency room at once. NOTE: This medicine is only for you. Do not share this medicine with others. What if I miss a dose? Keep appointments for follow-up doses. It is important not to miss your dose. Call your care team if you are unable to keep an appointment. What may interact with this medication? Interactions have not been studied. This list may not describe all possible interactions. Give your health care provider a list of all the medicines, herbs, non-prescription drugs, or dietary supplements you use. Also tell them if you smoke, drink alcohol, or use illegal drugs. Some items may interact with your medicine. What should I watch for while using this medication? Your condition will be monitored carefully while you are receiving this medication. This medication can cause serious allergic reactions. To reduce your risk, your care team may give you other medication to take before receiving this one. Be  sure to follow the directions from your care team. This medication can affect the results of blood tests to match your blood type. These changes can last for up to 6 months after the final dose. Your care team will do blood tests to match your blood type before you start treatment. Tell all of your care team that you are being treated with this medication before receiving a blood transfusion. This medication can affect the results of some tests used to determine treatment response; extra tests may be needed to evaluate response. Talk to your care team if you wish to become pregnant or think you are pregnant. This medication can cause serious birth defects if taken during pregnancy and for 3 months after the last dose. A reliable form of contraception is recommended while taking this medication and for 3 months after the last dose. Talk to your care team about effective forms of contraception. Do not breast-feed while taking this medication. What side effects may I notice from receiving this medication? Side effects that you should report to your care team as soon as possible: Allergic reactions--skin rash, itching, hives, swelling of the face, lips, tongue, or throat Heart rhythm changes--fast or irregular heartbeat, dizziness, feeling faint or lightheaded, chest pain, trouble breathing Infection--fever, chills, cough, sore throat, wounds that don't heal, pain or trouble when passing urine, general  feeling of discomfort or being unwell Infusion reactions--chest pain, shortness of breath or trouble breathing, feeling faint or lightheaded Sudden eye pain or change in vision such as blurry vision, seeing halos around lights, vision loss Unusual bruising or bleeding Side effects that usually do not require medical attention (report to your care team if they continue or are bothersome): Constipation Diarrhea Fatigue Nausea Pain, tingling, or numbness in the hands or feet Swelling of the ankles, hands,  or feet This list may not describe all possible side effects. Call your doctor for medical advice about side effects. You may report side effects to FDA at 1-800-FDA-1088. Where should I keep my medication? This medication is given in a hospital or clinic. It will not be stored at home. NOTE: This sheet is a summary. It may not cover all possible information. If you have questions about this medicine, talk to your doctor, pharmacist, or health care provider.  2023 Elsevier/Gold Standard (2021-10-24 00:00:00)

## 2022-04-05 LAB — KAPPA/LAMBDA LIGHT CHAINS
Kappa free light chain: 24.6 mg/L — ABNORMAL HIGH (ref 3.3–19.4)
Kappa, lambda light chain ratio: 6.83 — ABNORMAL HIGH (ref 0.26–1.65)
Lambda free light chains: 3.6 mg/L — ABNORMAL LOW (ref 5.7–26.3)

## 2022-04-06 ENCOUNTER — Other Ambulatory Visit: Payer: Self-pay

## 2022-04-10 LAB — MULTIPLE MYELOMA PANEL, SERUM
Albumin SerPl Elph-Mcnc: 3.8 g/dL (ref 2.9–4.4)
Albumin/Glob SerPl: 1.4 (ref 0.7–1.7)
Alpha 1: 0.2 g/dL (ref 0.0–0.4)
Alpha2 Glob SerPl Elph-Mcnc: 0.6 g/dL (ref 0.4–1.0)
B-Globulin SerPl Elph-Mcnc: 0.9 g/dL (ref 0.7–1.3)
Gamma Glob SerPl Elph-Mcnc: 1.1 g/dL (ref 0.4–1.8)
Globulin, Total: 2.9 g/dL (ref 2.2–3.9)
IgA: 25 mg/dL — ABNORMAL LOW (ref 61–437)
IgG (Immunoglobin G), Serum: 1323 mg/dL (ref 603–1613)
IgM (Immunoglobulin M), Srm: 18 mg/dL — ABNORMAL LOW (ref 20–172)
M Protein SerPl Elph-Mcnc: 0.7 g/dL — ABNORMAL HIGH
Total Protein ELP: 6.7 g/dL (ref 6.0–8.5)

## 2022-04-15 ENCOUNTER — Other Ambulatory Visit: Payer: Self-pay

## 2022-04-23 ENCOUNTER — Other Ambulatory Visit: Payer: Self-pay

## 2022-04-29 ENCOUNTER — Other Ambulatory Visit (HOSPITAL_COMMUNITY): Payer: Self-pay

## 2022-04-29 MED ORDER — OXYCODONE-ACETAMINOPHEN 10-325 MG PO TABS
1.0000 | ORAL_TABLET | Freq: Two times a day (BID) | ORAL | 0 refills | Status: DC | PRN
  Filled 2022-05-01: qty 60, 30d supply, fill #0

## 2022-05-01 ENCOUNTER — Other Ambulatory Visit (HOSPITAL_COMMUNITY): Payer: Self-pay

## 2022-05-02 ENCOUNTER — Inpatient Hospital Stay: Payer: Medicare Other | Attending: Oncology | Admitting: Oncology

## 2022-05-02 ENCOUNTER — Other Ambulatory Visit: Payer: Self-pay

## 2022-05-02 ENCOUNTER — Inpatient Hospital Stay: Payer: Medicare Other

## 2022-05-02 VITALS — BP 166/87 | HR 62 | Temp 98.0°F | Resp 17 | Ht 73.0 in | Wt 153.6 lb

## 2022-05-02 DIAGNOSIS — C9001 Multiple myeloma in remission: Secondary | ICD-10-CM | POA: Diagnosis not present

## 2022-05-02 DIAGNOSIS — Z7969 Long term (current) use of other immunomodulators and immunosuppressants: Secondary | ICD-10-CM | POA: Insufficient documentation

## 2022-05-02 DIAGNOSIS — Z886 Allergy status to analgesic agent status: Secondary | ICD-10-CM | POA: Insufficient documentation

## 2022-05-02 DIAGNOSIS — C61 Malignant neoplasm of prostate: Secondary | ICD-10-CM

## 2022-05-02 DIAGNOSIS — Z5112 Encounter for antineoplastic immunotherapy: Secondary | ICD-10-CM | POA: Diagnosis not present

## 2022-05-02 DIAGNOSIS — D649 Anemia, unspecified: Secondary | ICD-10-CM | POA: Insufficient documentation

## 2022-05-02 DIAGNOSIS — Z79624 Long term (current) use of inhibitors of nucleotide synthesis: Secondary | ICD-10-CM | POA: Insufficient documentation

## 2022-05-02 DIAGNOSIS — Z79899 Other long term (current) drug therapy: Secondary | ICD-10-CM | POA: Insufficient documentation

## 2022-05-02 DIAGNOSIS — Z7952 Long term (current) use of systemic steroids: Secondary | ICD-10-CM | POA: Diagnosis not present

## 2022-05-02 DIAGNOSIS — C9002 Multiple myeloma in relapse: Secondary | ICD-10-CM | POA: Diagnosis not present

## 2022-05-02 DIAGNOSIS — Z95828 Presence of other vascular implants and grafts: Secondary | ICD-10-CM

## 2022-05-02 LAB — CMP (CANCER CENTER ONLY)
ALT: 13 U/L (ref 0–44)
AST: 17 U/L (ref 15–41)
Albumin: 4.2 g/dL (ref 3.5–5.0)
Alkaline Phosphatase: 90 U/L (ref 38–126)
Anion gap: 5 (ref 5–15)
BUN: 17 mg/dL (ref 8–23)
CO2: 27 mmol/L (ref 22–32)
Calcium: 9.3 mg/dL (ref 8.9–10.3)
Chloride: 104 mmol/L (ref 98–111)
Creatinine: 0.85 mg/dL (ref 0.61–1.24)
GFR, Estimated: >60 mL/min (ref >60)
Glucose, Bld: 115 mg/dL — ABNORMAL HIGH (ref 70–99)
Potassium: 4.4 mmol/L (ref 3.5–5.1)
Sodium: 136 mmol/L (ref 135–145)
Total Bilirubin: 0.3 mg/dL (ref 0.3–1.2)
Total Protein: 7.2 g/dL (ref 6.5–8.1)

## 2022-05-02 LAB — CBC WITH DIFFERENTIAL (CANCER CENTER ONLY)
Abs Immature Granulocytes: 0.01 10*3/uL (ref 0.00–0.07)
Basophils Absolute: 0 10*3/uL (ref 0.0–0.1)
Basophils Relative: 0 %
Eosinophils Absolute: 0 10*3/uL (ref 0.0–0.5)
Eosinophils Relative: 0 %
HCT: 35.5 % — ABNORMAL LOW (ref 39.0–52.0)
Hemoglobin: 12.2 g/dL — ABNORMAL LOW (ref 13.0–17.0)
Immature Granulocytes: 0 %
Lymphocytes Relative: 35 %
Lymphs Abs: 1.6 10*3/uL (ref 0.7–4.0)
MCH: 32.5 pg (ref 26.0–34.0)
MCHC: 34.4 g/dL (ref 30.0–36.0)
MCV: 94.7 fL (ref 80.0–100.0)
Monocytes Absolute: 0.3 10*3/uL (ref 0.1–1.0)
Monocytes Relative: 6 %
Neutro Abs: 2.7 10*3/uL (ref 1.7–7.7)
Neutrophils Relative %: 59 %
Platelet Count: 174 10*3/uL (ref 150–400)
RBC: 3.75 MIL/uL — ABNORMAL LOW (ref 4.22–5.81)
RDW: 12.4 % (ref 11.5–15.5)
WBC Count: 4.6 10*3/uL (ref 4.0–10.5)
nRBC: 0 % (ref 0.0–0.2)

## 2022-05-02 MED ORDER — ACETAMINOPHEN 325 MG PO TABS
650.0000 mg | ORAL_TABLET | Freq: Once | ORAL | Status: AC
  Administered 2022-05-02: 650 mg via ORAL
  Filled 2022-05-02: qty 2

## 2022-05-02 MED ORDER — SODIUM CHLORIDE 0.9% FLUSH
10.0000 mL | INTRAVENOUS | Status: DC | PRN
  Administered 2022-05-02: 10 mL

## 2022-05-02 MED ORDER — DIPHENHYDRAMINE HCL 25 MG PO CAPS
50.0000 mg | ORAL_CAPSULE | Freq: Once | ORAL | Status: AC
  Administered 2022-05-02: 50 mg via ORAL
  Filled 2022-05-02: qty 2

## 2022-05-02 MED ORDER — HEPARIN SOD (PORK) LOCK FLUSH 100 UNIT/ML IV SOLN
500.0000 [IU] | Freq: Once | INTRAVENOUS | Status: AC | PRN
  Administered 2022-05-02: 500 [IU] via INTRAVENOUS

## 2022-05-02 MED ORDER — DARATUMUMAB-HYALURONIDASE-FIHJ 1800-30000 MG-UT/15ML ~~LOC~~ SOLN
1800.0000 mg | Freq: Once | SUBCUTANEOUS | Status: AC
  Administered 2022-05-02: 1800 mg via SUBCUTANEOUS
  Filled 2022-05-02: qty 15

## 2022-05-02 MED ORDER — DEXAMETHASONE 4 MG PO TABS
8.0000 mg | ORAL_TABLET | Freq: Once | ORAL | Status: AC
  Administered 2022-05-02: 8 mg via ORAL
  Filled 2022-05-02: qty 2

## 2022-05-02 NOTE — Patient Instructions (Signed)
[facility]  Discharge Instructions: Thank you for choosing [facility] to provide your oncology and hematology care.   If you have a lab appointment with the Cancer Center, please go directly to the Cancer Center and check in at the registration area.   Wear comfortable clothing and clothing appropriate for easy access to any Portacath or PICC line.   We strive to give you quality time with your provider. You may need to reschedule your appointment if you arrive late (15 or more minutes).  Arriving late affects you and other patients whose appointments are after yours.  Also, if you miss three or more appointments without notifying the office, you may be dismissed from the clinic at the provider's discretion.      For prescription refill requests, have your pharmacy contact our office and allow 72 hours for refills to be completed.    Today you received the following chemotherapy and/or immunotherapy agents: Dara Faspro.      To help prevent nausea and vomiting after your treatment, we encourage you to take your nausea medication as directed.  BELOW ARE SYMPTOMS THAT SHOULD BE REPORTED IMMEDIATELY: *FEVER GREATER THAN 100.4 F (38 C) OR HIGHER *CHILLS OR SWEATING *NAUSEA AND VOMITING THAT IS NOT CONTROLLED WITH YOUR NAUSEA MEDICATION *UNUSUAL SHORTNESS OF BREATH *UNUSUAL BRUISING OR BLEEDING *URINARY PROBLEMS (pain or burning when urinating, or frequent urination) *BOWEL PROBLEMS (unusual diarrhea, constipation, pain near the anus) TENDERNESS IN MOUTH AND THROAT WITH OR WITHOUT PRESENCE OF ULCERS (sore throat, sores in mouth, or a toothache) UNUSUAL RASH, SWELLING OR PAIN  UNUSUAL VAGINAL DISCHARGE OR ITCHING   Items with * indicate a potential emergency and should be followed up as soon as possible or go to the Emergency Department if any problems should occur.  Please show the CHEMOTHERAPY ALERT CARD or IMMUNOTHERAPY ALERT CARD at check-in  to the Emergency Department and triage nurse.  Should you have questions after your visit or need to cancel or reschedule your appointment, please contact [facility]  Dept: [phone]  and follow the prompts.  Office hours are 8:00 a.m. to 4:30 p.m. Monday - Friday. Please note that voicemails left after 4:00 p.m. may not be returned until the following business day.  We are closed weekends and major holidays. You have access to a nurse at all times for urgent questions. Please call the main number to the clinic Dept: [phone] and follow the prompts.   For any non-urgent questions, you may also contact your provider using MyChart. We now offer e-Visits for anyone 18 and older to request care online for non-urgent symptoms. For details visit [email or web address].   Also download the MyChart app! Go to the app store, search "MyChart", open the app, select [facility], and log in with your MyChart username and password.  Masks are optional in the cancer centers. If you would like for your care team to wear a mask while they are taking care of you, please let them know. You may have one support person who is at least 69 years old accompany you for your appointments. 

## 2022-05-02 NOTE — Progress Notes (Signed)
Hematology and Oncology Follow Up Visit  Travis Nguyen [id number] [birth year] 71 y.o. 05/02/2022 8:00 AM [name], MDShadad, [name], MD   Principle Diagnosis: 72 year old man with multiple myeloma diagnosed in 2014.  He developed relapsed IgG kappa subtype in 2022.   Secondary diagnosis: Stage a T1c, Gleason score 3+4 = 7 prostate cancer that is currently in remission.  Prior Therapy: He was treated initially with Cytoxan, Velcade and Decadron and subsequently his regimen changed to a Velcade, Revlimid with dexamethasone.  He achieved remission at that time.   He is status post a robotic-assisted laparoscopic radical prostatectomy and bilateral lymph node dissection on 02/09/2014. The final pathology showed prostate adenocarcinoma Gleason score 4+3 equals 7 involving both lobes.    Carfilzomib, and dexamethasone, daratumumab started on July 28, 2020.  Dexamethasone and carfilzomib is weekly with daratumumab every 2 weeks.  Therapy concluded in December 2022.  Current therapy: He is currently receiving Darzalex Faspro on a monthly maintenance with dexamethasone started in January 2023.  Interim History:  Mr. Travis Nguyen is here for return evaluation.  Since last visit, he reports feeling well without any major complaints.  He denies any nausea, vomiting or abdominal pain.  He denies any worsening neuropathy or fatigue.  He denies any bone pain or pathological fractures.  He has reported decreased appetite associated with steroids.   Medications: Updated on review. Current Outpatient Medications  Medication Sig Dispense Refill   amLODipine (NORVASC) 10 MG tablet Take 1 tablet by mouth once a day (Patient taking differently: Take 10 mg by mouth daily.) 90 tablet 1   amLODipine (NORVASC) 5 MG tablet Take 1 tablet (5 mg total) by mouth daily. 90 tablet 1   doxycycline (VIBRAMYCIN) 100 MG capsule Take 1 capsule by mouth 2 times daily. 20 capsule 0   escitalopram (LEXAPRO) 10 MG  tablet Take 1 tablet by mouth once a day (Patient taking differently: Take 10 mg by mouth daily.) 90 tablet 1   escitalopram (LEXAPRO) 10 MG tablet Take 1 tablet by mouth once daily 90 tablet 1   escitalopram (LEXAPRO) 10 MG tablet Take 1 tablet (10 mg total) by mouth daily. 90 tablet 1   losartan (COZAAR) 100 MG tablet TAKE 1 TABLET BY MOUTH ONCE A DAY 90 tablet 0   losartan (COZAAR) 100 MG tablet Take 1 tablet by mouth once a day (Patient taking differently: Take 100 mg by mouth daily.) 90 tablet 1   losartan (COZAAR) 100 MG tablet Take 1 tablet by mouth daily 90 tablet 1   losartan (COZAAR) 100 MG tablet Take 1 tablet (100 mg total) by mouth daily. 90 tablet 1   oxyCODONE-acetaminophen (PERCOCET) 10-325 MG tablet Take 1 tablet by mouth 2 times a day (Patient taking differently: Take 1 tablet by mouth 2 (two) times daily as needed for pain.) 60 tablet 0   oxyCODONE-acetaminophen (PERCOCET) 10-325 MG tablet Take 1 tablet by mouth twice daily as needed (07/05/21) 60 tablet 0   oxyCODONE-acetaminophen (PERCOCET) 10-325 MG tablet Take 1 tablet by mouth 2 (two) times daily as needed. 60 tablet 0   oxyCODONE-acetaminophen (PERCOCET) 10-325 MG tablet Take 1 tablet by mouth 2 times a day as needed orally (ok to fill 30 days after last refill) 60 tablet 0   oxyCODONE-acetaminophen (PERCOCET) 10-325 MG tablet Take 1 tablet by mouth 2 (two) times daily as needed. 60 tablet 0   pantoprazole (PROTONIX) 20 MG tablet Take 1 tablet (20 mg total) by mouth daily. 14 tablet 0  vitamin C (ASCORBIC ACID) 500 MG tablet Take 500 mg by mouth daily.     No current facility-administered medications for this visit.     Allergies:  Allergies  Allergen Reactions   Aspirin     Pt stated "raises BP"        Physical Exam:   Blood pressure (!) 166/87, pulse 62, temperature 98 F (36.7 C), temperature source Tympanic, resp. rate 17, height 6' 1"  (1.854 m), weight 153 lb 9.6 oz (69.7 kg), SpO2 98 %.   ECOG:  1   General appearance: Comfortable appearing without any discomfort Head: Normocephalic without any trauma Oropharynx: Mucous membranes are moist and pink without any thrush or ulcers. Eyes: Pupils are equal and round reactive to light. Lymph nodes: No cervical, supraclavicular, inguinal or axillary lymphadenopathy.   Heart:regular rate and rhythm.  S1 and S2 without leg edema. Lung: Clear without any rhonchi or wheezes.  No dullness to percussion. Abdomin: Soft, nontender, nondistended with good bowel sounds.  No hepatosplenomegaly. Musculoskeletal: No joint deformity or effusion.  Full range of motion noted. Neurological: No deficits noted on motor, sensory and deep tendon reflex exam. Skin: No petechial rash or dryness.  Appeared moist.                       Lab Results: Lab Results  Component Value Date   WBC 4.5 04/04/2022   HGB 12.4 (L) 04/04/2022   HCT 36.5 (L) 04/04/2022   MCV 96.1 04/04/2022   PLT 158 04/04/2022   PSA 14.27 (H) 01/28/2013     Chemistry      Component Value Date/Time   NA 136 04/04/2022 0831   NA 138 03/27/2017 0838   K 4.4 04/04/2022 0831   K 4.3 03/27/2017 0838   CL 106 04/04/2022 0831   CL 105 01/01/2013 0835   CO2 27 04/04/2022 0831   CO2 24 03/27/2017 0838   BUN 12 04/04/2022 0831   BUN 23.7 03/27/2017 0838   CREATININE 0.84 04/04/2022 0831   CREATININE 1.2 03/27/2017 0838      Component Value Date/Time   CALCIUM 9.2 04/04/2022 0831   CALCIUM 9.9 03/27/2017 0838   ALKPHOS 95 04/04/2022 0831   ALKPHOS 79 03/27/2017 0838   AST 27 04/04/2022 0831   AST 25 03/27/2017 0838   ALT 34 04/04/2022 0831   ALT 26 03/27/2017 0838   BILITOT 0.5 04/04/2022 0831   BILITOT 0.31 03/27/2017 0838       Latest Reference Range & Units 03/07/22 08:28 04/04/22 08:31  IgG (Immunoglobin G), Serum 603 - 1,613 mg/dL 1,286 1,323  IgM (Immunoglobulin M), Srm 20 - 172 mg/dL 21 18 (L)  IgA 61 - 437 mg/dL 29 (L) 25 (L)  Kappa free light  chain 3.3 - 19.4 mg/L 23.2 (H) 24.6 (H)  Lambda free light chains 5.7 - 26.3 mg/L 3.7 (L) 3.6 (L)  Kappa, lambda light chain ratio 0.26 - 1.65  6.27 (H) 6.83 (H)  (L): Data is abnormally low (H): Data is abnormally high    Latest Reference Range & Units 08/03/21 10:47 09/17/21 08:22 10/15/21 08:04 11/12/21 08:22 03/07/22 08:28 04/04/22 08:31  M Protein SerPl Elph-Mcnc Not Observed g/dL 0.5 (H) (C) 0.6 (H) (C) 0.7 (H) (C) 0.7 (H) (C) 0.9 (H) (C) 0.7 (H) (C)  (H): Data is abnormally high (C): Corrected  Impression and Plan:  71 year old man with:   1. IgG kappa multiple myeloma diagnosed in 2014.  He developed relapsed disease in  2022.   He remains on daratumumab maintenance without any major complications.  Protein studies obtained on April 04, 2022 continues to show overall residual disease that is asymptomatic.  He has an M spike of 0.7 g/dL with normal IgG level.  He continues to have slight elevation in his kappa to lambda ratio.  Different salvage therapy options were discussed at this time will be instituted if he has symptomatic progression.  At this time I recommended continued daratumumab maintenance.  I will decrease his dexamethasone dosing to 8 mg monthly and will continue to taper down on subsequent visits.   2.  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  3.  IV access: Port-A-Cath continues to be flushed without any issues.   4.  VZV prophylaxis: He is currently on acyclovir without any reactivation.  5. Followup: He will return in 1 month for repeat evaluation.   30  minutes were spent on this encounter.  The time was dedicated to reviewing laboratory data, disease status update and outlining future plan of care discussion.   Zola Button, MD 10/19/20238:00 AM

## 2022-05-03 LAB — KAPPA/LAMBDA LIGHT CHAINS
Kappa free light chain: 26.5 mg/L — ABNORMAL HIGH (ref 3.3–19.4)
Kappa, lambda light chain ratio: 7.79 — ABNORMAL HIGH (ref 0.26–1.65)
Lambda free light chains: 3.4 mg/L — ABNORMAL LOW (ref 5.7–26.3)

## 2022-05-06 LAB — MULTIPLE MYELOMA PANEL, SERUM
Albumin SerPl Elph-Mcnc: 3.9 g/dL (ref 2.9–4.4)
Albumin/Glob SerPl: 1.4 (ref 0.7–1.7)
Alpha 1: 0.2 g/dL (ref 0.0–0.4)
Alpha2 Glob SerPl Elph-Mcnc: 0.8 g/dL (ref 0.4–1.0)
B-Globulin SerPl Elph-Mcnc: 0.8 g/dL (ref 0.7–1.3)
Gamma Glob SerPl Elph-Mcnc: 1.1 g/dL (ref 0.4–1.8)
Globulin, Total: 2.9 g/dL (ref 2.2–3.9)
IgA: 26 mg/dL — ABNORMAL LOW (ref 61–437)
IgG (Immunoglobin G), Serum: 1297 mg/dL (ref 603–1613)
IgM (Immunoglobulin M), Srm: 18 mg/dL — ABNORMAL LOW (ref 20–172)
M Protein SerPl Elph-Mcnc: 0.9 g/dL — ABNORMAL HIGH
Total Protein ELP: 6.8 g/dL (ref 6.0–8.5)

## 2022-05-07 ENCOUNTER — Other Ambulatory Visit: Payer: Self-pay

## 2022-05-19 ENCOUNTER — Other Ambulatory Visit: Payer: Self-pay

## 2022-05-20 ENCOUNTER — Other Ambulatory Visit (HOSPITAL_COMMUNITY): Payer: Self-pay

## 2022-05-20 DIAGNOSIS — M545 Low back pain, unspecified: Secondary | ICD-10-CM | POA: Diagnosis not present

## 2022-05-20 MED ORDER — CYCLOBENZAPRINE HCL 5 MG PO TABS
ORAL_TABLET | ORAL | 0 refills | Status: DC
  Filled 2022-05-20: qty 60, 30d supply, fill #0

## 2022-05-21 ENCOUNTER — Other Ambulatory Visit (HOSPITAL_COMMUNITY): Payer: Self-pay

## 2022-05-22 ENCOUNTER — Other Ambulatory Visit (HOSPITAL_COMMUNITY): Payer: Self-pay

## 2022-05-22 DIAGNOSIS — Z23 Encounter for immunization: Secondary | ICD-10-CM | POA: Diagnosis not present

## 2022-05-22 DIAGNOSIS — M5416 Radiculopathy, lumbar region: Secondary | ICD-10-CM | POA: Diagnosis not present

## 2022-05-22 MED ORDER — OXYCODONE-ACETAMINOPHEN 10-325 MG PO TABS
1.0000 | ORAL_TABLET | Freq: Two times a day (BID) | ORAL | 0 refills | Status: DC
  Filled 2022-05-22 – 2022-06-01 (×2): qty 60, 30d supply, fill #0

## 2022-05-23 ENCOUNTER — Other Ambulatory Visit (HOSPITAL_COMMUNITY): Payer: Self-pay

## 2022-05-30 ENCOUNTER — Inpatient Hospital Stay: Payer: Medicare Other | Admitting: Oncology

## 2022-05-30 ENCOUNTER — Encounter: Payer: Self-pay | Admitting: Oncology

## 2022-05-30 ENCOUNTER — Inpatient Hospital Stay: Payer: Medicare Other | Attending: Oncology

## 2022-05-30 ENCOUNTER — Telehealth: Payer: Self-pay

## 2022-05-30 ENCOUNTER — Inpatient Hospital Stay: Payer: Medicare Other

## 2022-05-30 DIAGNOSIS — Z79899 Other long term (current) drug therapy: Secondary | ICD-10-CM | POA: Insufficient documentation

## 2022-05-30 DIAGNOSIS — D649 Anemia, unspecified: Secondary | ICD-10-CM | POA: Insufficient documentation

## 2022-05-30 DIAGNOSIS — C9002 Multiple myeloma in relapse: Secondary | ICD-10-CM | POA: Insufficient documentation

## 2022-05-30 DIAGNOSIS — Z5112 Encounter for antineoplastic immunotherapy: Secondary | ICD-10-CM | POA: Insufficient documentation

## 2022-05-30 NOTE — Telephone Encounter (Signed)
Attempted to call patient on all numbers listed under contact information. Mobile number does not have VM set up. Second mobile number is incorrect. Patient NO show for appointments today. Will send my-chart message requesting that patient call to re-schedule.

## 2022-05-31 ENCOUNTER — Ambulatory Visit: Payer: Medicare Other | Admitting: Oncology

## 2022-05-31 ENCOUNTER — Inpatient Hospital Stay: Payer: Medicare Other

## 2022-05-31 ENCOUNTER — Other Ambulatory Visit: Payer: Self-pay

## 2022-05-31 ENCOUNTER — Other Ambulatory Visit (HOSPITAL_COMMUNITY): Payer: Self-pay

## 2022-05-31 VITALS — BP 164/89 | HR 62 | Temp 98.2°F | Resp 18 | Wt 153.0 lb

## 2022-05-31 DIAGNOSIS — Z79899 Other long term (current) drug therapy: Secondary | ICD-10-CM | POA: Diagnosis not present

## 2022-05-31 DIAGNOSIS — Z95828 Presence of other vascular implants and grafts: Secondary | ICD-10-CM

## 2022-05-31 DIAGNOSIS — C9002 Multiple myeloma in relapse: Secondary | ICD-10-CM | POA: Diagnosis not present

## 2022-05-31 DIAGNOSIS — C61 Malignant neoplasm of prostate: Secondary | ICD-10-CM

## 2022-05-31 DIAGNOSIS — C9001 Multiple myeloma in remission: Secondary | ICD-10-CM

## 2022-05-31 DIAGNOSIS — Z5112 Encounter for antineoplastic immunotherapy: Secondary | ICD-10-CM | POA: Diagnosis not present

## 2022-05-31 DIAGNOSIS — D649 Anemia, unspecified: Secondary | ICD-10-CM | POA: Diagnosis not present

## 2022-05-31 LAB — CBC WITH DIFFERENTIAL (CANCER CENTER ONLY)
Abs Immature Granulocytes: 0.01 10*3/uL (ref 0.00–0.07)
Basophils Absolute: 0 10*3/uL (ref 0.0–0.1)
Basophils Relative: 0 %
Eosinophils Absolute: 0 10*3/uL (ref 0.0–0.5)
Eosinophils Relative: 1 %
HCT: 36 % — ABNORMAL LOW (ref 39.0–52.0)
Hemoglobin: 12.3 g/dL — ABNORMAL LOW (ref 13.0–17.0)
Immature Granulocytes: 0 %
Lymphocytes Relative: 35 %
Lymphs Abs: 1.5 10*3/uL (ref 0.7–4.0)
MCH: 32.6 pg (ref 26.0–34.0)
MCHC: 34.2 g/dL (ref 30.0–36.0)
MCV: 95.5 fL (ref 80.0–100.0)
Monocytes Absolute: 0.3 10*3/uL (ref 0.1–1.0)
Monocytes Relative: 7 %
Neutro Abs: 2.4 10*3/uL (ref 1.7–7.7)
Neutrophils Relative %: 57 %
Platelet Count: 182 10*3/uL (ref 150–400)
RBC: 3.77 MIL/uL — ABNORMAL LOW (ref 4.22–5.81)
RDW: 12.6 % (ref 11.5–15.5)
WBC Count: 4.2 10*3/uL (ref 4.0–10.5)
nRBC: 0 % (ref 0.0–0.2)

## 2022-05-31 LAB — CMP (CANCER CENTER ONLY)
ALT: 12 U/L (ref 0–44)
AST: 18 U/L (ref 15–41)
Albumin: 4.4 g/dL (ref 3.5–5.0)
Alkaline Phosphatase: 85 U/L (ref 38–126)
Anion gap: 5 (ref 5–15)
BUN: 14 mg/dL (ref 8–23)
CO2: 27 mmol/L (ref 22–32)
Calcium: 9.2 mg/dL (ref 8.9–10.3)
Chloride: 105 mmol/L (ref 98–111)
Creatinine: 0.86 mg/dL (ref 0.61–1.24)
GFR, Estimated: >60 mL/min (ref >60)
Glucose, Bld: 94 mg/dL (ref 70–99)
Potassium: 4.2 mmol/L (ref 3.5–5.1)
Sodium: 137 mmol/L (ref 135–145)
Total Bilirubin: 0.4 mg/dL (ref 0.3–1.2)
Total Protein: 7.3 g/dL (ref 6.5–8.1)

## 2022-05-31 MED ORDER — SODIUM CHLORIDE 0.9% FLUSH
10.0000 mL | INTRAVENOUS | Status: DC | PRN
  Administered 2022-05-31: 10 mL

## 2022-05-31 MED ORDER — DIPHENHYDRAMINE HCL 25 MG PO CAPS
50.0000 mg | ORAL_CAPSULE | Freq: Once | ORAL | Status: AC
  Administered 2022-05-31: 50 mg via ORAL
  Filled 2022-05-31: qty 2

## 2022-05-31 MED ORDER — DARATUMUMAB-HYALURONIDASE-FIHJ 1800-30000 MG-UT/15ML ~~LOC~~ SOLN
1800.0000 mg | Freq: Once | SUBCUTANEOUS | Status: AC
  Administered 2022-05-31: 1800 mg via SUBCUTANEOUS
  Filled 2022-05-31: qty 15

## 2022-05-31 MED ORDER — DEXAMETHASONE 4 MG PO TABS
8.0000 mg | ORAL_TABLET | Freq: Once | ORAL | Status: AC
  Administered 2022-05-31: 8 mg via ORAL
  Filled 2022-05-31: qty 2

## 2022-05-31 MED ORDER — ACETAMINOPHEN 325 MG PO TABS
650.0000 mg | ORAL_TABLET | Freq: Once | ORAL | Status: AC
  Administered 2022-05-31: 650 mg via ORAL
  Filled 2022-05-31: qty 2

## 2022-06-01 ENCOUNTER — Other Ambulatory Visit (HOSPITAL_COMMUNITY): Payer: Self-pay

## 2022-06-03 LAB — MULTIPLE MYELOMA PANEL, SERUM
Albumin SerPl Elph-Mcnc: 4.1 g/dL (ref 2.9–4.4)
Albumin/Glob SerPl: 1.5 (ref 0.7–1.7)
Alpha 1: 0.2 g/dL (ref 0.0–0.4)
Alpha2 Glob SerPl Elph-Mcnc: 0.7 g/dL (ref 0.4–1.0)
B-Globulin SerPl Elph-Mcnc: 0.7 g/dL (ref 0.7–1.3)
Gamma Glob SerPl Elph-Mcnc: 1.2 g/dL (ref 0.4–1.8)
Globulin, Total: 2.8 g/dL (ref 2.2–3.9)
IgA: 26 mg/dL — ABNORMAL LOW (ref 61–437)
IgG (Immunoglobin G), Serum: 1328 mg/dL (ref 603–1613)
IgM (Immunoglobulin M), Srm: 19 mg/dL — ABNORMAL LOW (ref 20–172)
M Protein SerPl Elph-Mcnc: 1 g/dL — ABNORMAL HIGH
Total Protein ELP: 6.9 g/dL (ref 6.0–8.5)

## 2022-06-03 LAB — KAPPA/LAMBDA LIGHT CHAINS
Kappa free light chain: 27.6 mg/L — ABNORMAL HIGH (ref 3.3–19.4)
Kappa, lambda light chain ratio: 8.9 — ABNORMAL HIGH (ref 0.26–1.65)
Lambda free light chains: 3.1 mg/L — ABNORMAL LOW (ref 5.7–26.3)

## 2022-06-06 IMAGING — DX DG CHEST 1V PORT
1 series · 1 of 1 positions shown · non-contrast
Comparison: December 14, 2020

CLINICAL DATA: Cough.

EXAM:
PORTABLE CHEST 1 VIEW

[chest ap]
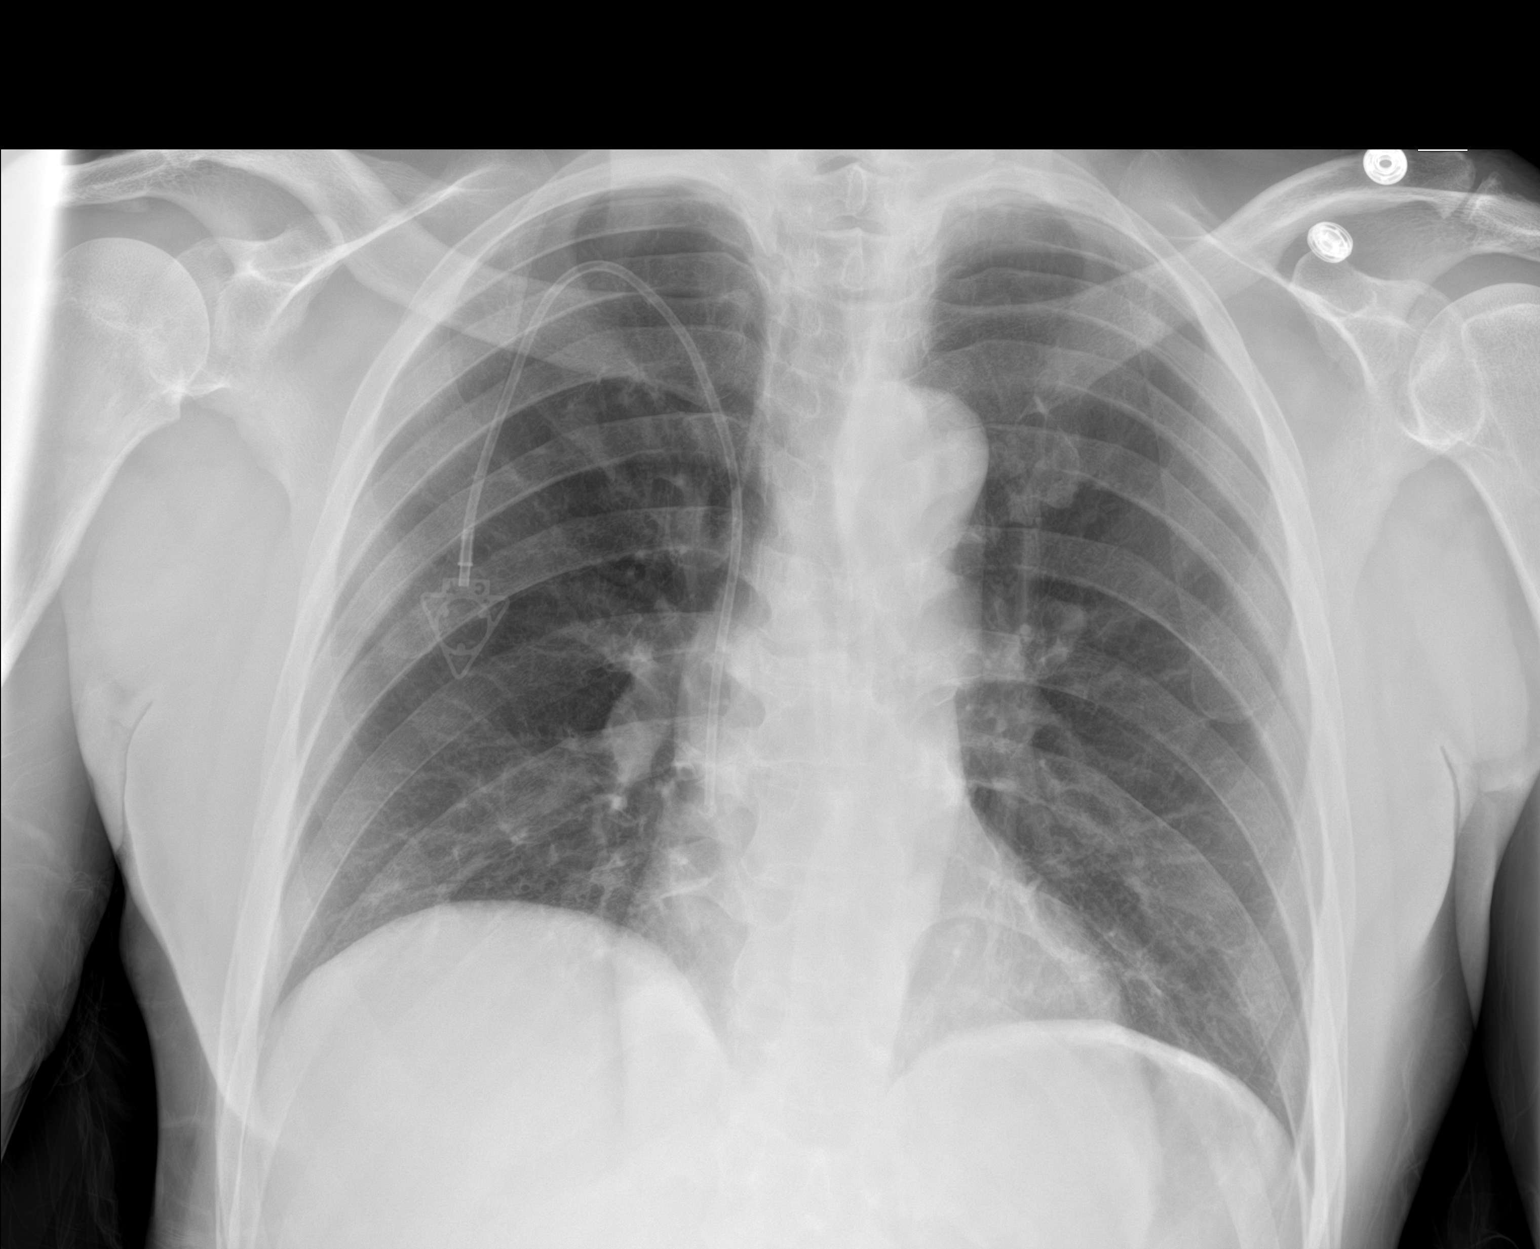

[1 of 1 positions shown; findings below may reference images not displayed]

FINDINGS: Stable right Port-A-Cath. No pneumothorax. The heart, hila, and
mediastinum are normal. No pulmonary nodules or masses. No focal
infiltrates. No other acute abnormalities.
IMPRESSION: No active disease.

## 2022-06-24 ENCOUNTER — Other Ambulatory Visit: Payer: Self-pay

## 2022-06-27 ENCOUNTER — Other Ambulatory Visit (HOSPITAL_COMMUNITY): Payer: Self-pay

## 2022-06-27 ENCOUNTER — Inpatient Hospital Stay: Payer: Medicare Other | Attending: Oncology

## 2022-06-27 ENCOUNTER — Inpatient Hospital Stay: Payer: Medicare Other

## 2022-06-27 ENCOUNTER — Inpatient Hospital Stay: Payer: Medicare Other | Admitting: Oncology

## 2022-06-27 DIAGNOSIS — C9002 Multiple myeloma in relapse: Secondary | ICD-10-CM | POA: Insufficient documentation

## 2022-06-27 DIAGNOSIS — Z7969 Long term (current) use of other immunomodulators and immunosuppressants: Secondary | ICD-10-CM | POA: Insufficient documentation

## 2022-06-27 DIAGNOSIS — Z886 Allergy status to analgesic agent status: Secondary | ICD-10-CM | POA: Insufficient documentation

## 2022-06-27 DIAGNOSIS — Z79624 Long term (current) use of inhibitors of nucleotide synthesis: Secondary | ICD-10-CM | POA: Insufficient documentation

## 2022-06-27 DIAGNOSIS — D649 Anemia, unspecified: Secondary | ICD-10-CM | POA: Insufficient documentation

## 2022-06-27 DIAGNOSIS — C61 Malignant neoplasm of prostate: Secondary | ICD-10-CM | POA: Insufficient documentation

## 2022-06-27 DIAGNOSIS — Z5112 Encounter for antineoplastic immunotherapy: Secondary | ICD-10-CM | POA: Insufficient documentation

## 2022-06-27 DIAGNOSIS — Z79899 Other long term (current) drug therapy: Secondary | ICD-10-CM | POA: Insufficient documentation

## 2022-06-27 MED ORDER — OXYCODONE-ACETAMINOPHEN 10-325 MG PO TABS
1.0000 | ORAL_TABLET | Freq: Two times a day (BID) | ORAL | 0 refills | Status: DC | PRN
  Filled 2022-06-27 – 2022-07-01 (×3): qty 60, 30d supply, fill #0

## 2022-06-28 ENCOUNTER — Other Ambulatory Visit (HOSPITAL_BASED_OUTPATIENT_CLINIC_OR_DEPARTMENT_OTHER): Payer: Self-pay

## 2022-06-28 ENCOUNTER — Inpatient Hospital Stay (HOSPITAL_BASED_OUTPATIENT_CLINIC_OR_DEPARTMENT_OTHER): Payer: Medicare Other | Admitting: Oncology

## 2022-06-28 ENCOUNTER — Other Ambulatory Visit (HOSPITAL_COMMUNITY): Payer: Self-pay

## 2022-06-28 ENCOUNTER — Other Ambulatory Visit: Payer: Self-pay | Admitting: Oncology

## 2022-06-28 VITALS — BP 184/93 | HR 60 | Temp 98.3°F | Resp 17 | Wt 150.4 lb

## 2022-06-28 DIAGNOSIS — C61 Malignant neoplasm of prostate: Secondary | ICD-10-CM | POA: Diagnosis not present

## 2022-06-28 DIAGNOSIS — Z79624 Long term (current) use of inhibitors of nucleotide synthesis: Secondary | ICD-10-CM | POA: Diagnosis not present

## 2022-06-28 DIAGNOSIS — Z886 Allergy status to analgesic agent status: Secondary | ICD-10-CM | POA: Diagnosis not present

## 2022-06-28 DIAGNOSIS — D649 Anemia, unspecified: Secondary | ICD-10-CM | POA: Diagnosis not present

## 2022-06-28 DIAGNOSIS — Z5112 Encounter for antineoplastic immunotherapy: Secondary | ICD-10-CM | POA: Diagnosis not present

## 2022-06-28 DIAGNOSIS — Z79899 Other long term (current) drug therapy: Secondary | ICD-10-CM | POA: Diagnosis not present

## 2022-06-28 DIAGNOSIS — C9001 Multiple myeloma in remission: Secondary | ICD-10-CM

## 2022-06-28 DIAGNOSIS — C9002 Multiple myeloma in relapse: Secondary | ICD-10-CM | POA: Diagnosis not present

## 2022-06-28 DIAGNOSIS — Z7969 Long term (current) use of other immunomodulators and immunosuppressants: Secondary | ICD-10-CM | POA: Diagnosis not present

## 2022-06-28 MED ORDER — LIDOCAINE-PRILOCAINE 2.5-2.5 % EX CREA
TOPICAL_CREAM | CUTANEOUS | 2 refills | Status: DC
  Filled 2022-06-28: qty 30, 15d supply, fill #0
  Filled 2023-01-17: qty 30, 15d supply, fill #1

## 2022-06-28 NOTE — Progress Notes (Signed)
Hematology and Oncology Follow Up Visit  Travis Nguyen [id number] Jan 26, 1951 71 y.o. 06/28/2022 10:12 AM [name], MDShadad, [name], MD   Principle Diagnosis: 55 year old man with IgG kappa multiple myeloma diagnosed in 2014 with relapsed disease in 2022.    Secondary diagnosis: Stage a T1c, Gleason score 3+4 = 7 prostate cancer that is currently in remission.  Prior Therapy: He was treated initially with Cytoxan, Velcade and Decadron and subsequently his regimen changed to a Velcade, Revlimid with dexamethasone.  He achieved remission in 2014.   He is status post a robotic-assisted laparoscopic radical prostatectomy and bilateral lymph node dissection on 02/09/2014. The final pathology showed prostate adenocarcinoma Gleason score 4+3 equals 7 involving both lobes.    Carfilzomib, and dexamethasone, daratumumab started on July 28, 2020.  Dexamethasone and carfilzomib is weekly with daratumumab every 2 weeks.  Therapy concluded in December 2022.  Current therapy: He is currently receiving Darzalex Faspro on a monthly maintenance with dexamethasone started in January 2023.  Interim History:  Travis Nguyen returns today for a follow-up evaluation.  Since last visit, he reports no major changes in his health.  He denies any bone pain or pathological fractures.  He denies any hospitalizations or illnesses.  His performance status quality of life remains unchanged. He continues to tolerate daratumumab without any major complaints.  He denies any skin rash, lesions or infusion related complications.   Medications: Reviewed without changes. Current Outpatient Medications  Medication Sig Dispense Refill   amLODipine (NORVASC) 10 MG tablet Take 1 tablet by mouth once a day (Patient taking differently: Take 10 mg by mouth daily.) 90 tablet 1   amLODipine (NORVASC) 5 MG tablet Take 1 tablet (5 mg total) by mouth daily. 90 tablet 1   cyclobenzaprine (FLEXERIL) 5 MG tablet Take 1 - 2  tablets by mouth at bedtime as needed 60 tablet 0   doxycycline (VIBRAMYCIN) 100 MG capsule Take 1 capsule by mouth 2 times daily. 20 capsule 0   escitalopram (LEXAPRO) 10 MG tablet Take 1 tablet by mouth once a day (Patient taking differently: Take 10 mg by mouth daily.) 90 tablet 1   escitalopram (LEXAPRO) 10 MG tablet Take 1 tablet by mouth once daily 90 tablet 1   escitalopram (LEXAPRO) 10 MG tablet Take 1 tablet (10 mg total) by mouth daily. 90 tablet 1   lidocaine-prilocaine (EMLA) cream APPLY TOPICALLY TO PORT-A-CATH DAILY AS NEEDED 30 g 2   losartan (COZAAR) 100 MG tablet TAKE 1 TABLET BY MOUTH ONCE A DAY 90 tablet 0   losartan (COZAAR) 100 MG tablet Take 1 tablet by mouth once a day (Patient taking differently: Take 100 mg by mouth daily.) 90 tablet 1   losartan (COZAAR) 100 MG tablet Take 1 tablet by mouth daily 90 tablet 1   losartan (COZAAR) 100 MG tablet Take 1 tablet (100 mg total) by mouth daily. 90 tablet 1   oxyCODONE-acetaminophen (PERCOCET) 10-325 MG tablet Take 1 tablet by mouth 2 times a day (Patient taking differently: Take 1 tablet by mouth 2 (two) times daily as needed for pain.) 60 tablet 0   oxyCODONE-acetaminophen (PERCOCET) 10-325 MG tablet Take 1 tablet by mouth twice daily as needed (07/05/21) 60 tablet 0   oxyCODONE-acetaminophen (PERCOCET) 10-325 MG tablet Take 1 tablet by mouth 2 (two) times daily as needed. 60 tablet 0   oxyCODONE-acetaminophen (PERCOCET) 10-325 MG tablet Take 1 tablet by mouth 2 times a day as needed orally (ok to fill 30 days after last  refill) 60 tablet 0   oxyCODONE-acetaminophen (PERCOCET) 10-325 MG tablet Take 1 tablet by mouth 2 (two) times daily as needed. 60 tablet 0   oxyCODONE-acetaminophen (PERCOCET) 10-325 MG tablet Take 1 tablet by mouth 2 (two) times daily. okay to fill 30 days after last refill 60 tablet 0   oxyCODONE-acetaminophen (PERCOCET) 10-325 MG tablet Take 1 tablet by mouth 2 (two) times daily as needed. (ok to fill 30 days  after last refill) 60 tablet 0   pantoprazole (PROTONIX) 20 MG tablet Take 1 tablet (20 mg total) by mouth daily. 14 tablet 0   vitamin C (ASCORBIC ACID) 500 MG tablet Take 500 mg by mouth daily.     No current facility-administered medications for this visit.     Allergies:  Allergies  Allergen Reactions   Aspirin     Pt stated "raises BP"        Physical Exam:   Blood pressure (!) 184/93, pulse 60, temperature 98.3 F (36.8 C), temperature source Oral, resp. rate 17, weight 150 lb 6.4 oz (68.2 kg), SpO2 100 %.    ECOG: 1     General appearance: Alert, awake without any distress. Head: Atraumatic without abnormalities Oropharynx: Without any thrush or ulcers. Eyes: No scleral icterus. Lymph nodes: No lymphadenopathy noted in the cervical, supraclavicular, or axillary nodes Heart:regular rate and rhythm, without any murmurs or gallops.   Lung: Clear to auscultation without any rhonchi, wheezes or dullness to percussion. Abdomin: Soft, nontender without any shifting dullness or ascites. Musculoskeletal: No clubbing or cyanosis. Neurological: No motor or sensory deficits. Skin: No rashes or lesions.                        Lab Results: Lab Results  Component Value Date   WBC 4.2 05/31/2022   HGB 12.3 (L) 05/31/2022   HCT 36.0 (L) 05/31/2022   MCV 95.5 05/31/2022   PLT 182 05/31/2022   PSA 14.27 (H) 01/28/2013     Chemistry      Component Value Date/Time   NA 137 05/31/2022 1321   NA 138 03/27/2017 0838   K 4.2 05/31/2022 1321   K 4.3 03/27/2017 0838   CL 105 05/31/2022 1321   CL 105 01/01/2013 0835   CO2 27 05/31/2022 1321   CO2 24 03/27/2017 0838   BUN 14 05/31/2022 1321   BUN 23.7 03/27/2017 0838   CREATININE 0.86 05/31/2022 1321   CREATININE 1.2 03/27/2017 0838      Component Value Date/Time   CALCIUM 9.2 05/31/2022 1321   CALCIUM 9.9 03/27/2017 0838   ALKPHOS 85 05/31/2022 1321   ALKPHOS 79 03/27/2017 0838   AST 18  05/31/2022 1321   AST 25 03/27/2017 0838   ALT 12 05/31/2022 1321   ALT 26 03/27/2017 0838   BILITOT 0.4 05/31/2022 1321   BILITOT 0.31 03/27/2017 0838         Latest Reference Range & Units 04/04/22 08:31 05/02/22 08:00 05/31/22 13:21  M Protein SerPl Elph-Mcnc Not Observed g/dL 0.7 (H) (C) 0.9 (H) (C) 1.0 (H) (C)  IFE 1  Comment ! (C) Comment ! (C) Comment ! (C)  Globulin, Total 2.2 - 3.9 g/dL 2.9 (C) 2.9 (C) 2.8 (C)  B-Globulin SerPl Elph-Mcnc 0.7 - 1.3 g/dL 0.9 (C) 0.8 (C) 0.7 (C)  IgG (Immunoglobin G), Serum 603 - 1,613 mg/dL 1,323 1,297 1,328  IgM (Immunoglobulin M), Srm 20 - 172 mg/dL 18 (L) 18 (L) 19 (L)  IgA 61 -  437 mg/dL 25 (L) 26 (L) 26 (L)      Latest Reference Range & Units 04/04/22 08:31 05/02/22 08:00 05/31/22 13:21  Kappa free light chain 3.3 - 19.4 mg/L 24.6 (H) 26.5 (H) 27.6 (H)  Lambda free light chains 5.7 - 26.3 mg/L 3.6 (L) 3.4 (L) 3.1 (L)  Kappa, lambda light chain ratio 0.26 - 1.65  6.83 (H) 7.79 (H) 8.90 (H)  (H): Data is abnormally high (L): Data is abnormally low  Impression and Plan:  71 year old man with:   1. IgG kappa multiple myeloma diagnosed in 2014.  He developed relapsed disease in 2022.   The natural course of his disease at this time was discussed and treatment choices were reviewed.  Continues to be on daratumumab maintenance with slight rise in his M spike and kappa to lambda ratio.  He remains asymptomatic however we will continue the same dose and schedule.  Definitive therapy could be considered in the future.  2.  Anemia: His hemoglobin continues to be close to normal range without any decline.  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   3.  IV access: Port-A-Cath will continue to be flushed periodically and can use.   4.  VZV prophylaxis: He is currently on acyclovir without any reactivation.  5. Followup: He will return on December 18 to resume daratumumab and in 4 weeks for repeat follow-up.   30  minutes were dedicated  to this visit.  The time was spent on reviewing his disease status, treatment choices and outlining future plan of care discussion.   Zola Button, MD 12/15/202310:12 AM

## 2022-06-29 ENCOUNTER — Other Ambulatory Visit: Payer: Medicare Other

## 2022-06-29 ENCOUNTER — Ambulatory Visit: Payer: Medicare Other

## 2022-06-30 ENCOUNTER — Other Ambulatory Visit: Payer: Self-pay

## 2022-07-01 ENCOUNTER — Inpatient Hospital Stay: Payer: Medicare Other

## 2022-07-01 ENCOUNTER — Other Ambulatory Visit: Payer: Self-pay

## 2022-07-01 ENCOUNTER — Other Ambulatory Visit: Payer: Self-pay | Admitting: *Deleted

## 2022-07-01 ENCOUNTER — Other Ambulatory Visit (HOSPITAL_COMMUNITY): Payer: Self-pay

## 2022-07-01 VITALS — BP 144/86 | HR 60 | Temp 98.5°F | Resp 16 | Wt 151.5 lb

## 2022-07-01 DIAGNOSIS — C9001 Multiple myeloma in remission: Secondary | ICD-10-CM

## 2022-07-01 DIAGNOSIS — Z95828 Presence of other vascular implants and grafts: Secondary | ICD-10-CM

## 2022-07-01 DIAGNOSIS — Z7969 Long term (current) use of other immunomodulators and immunosuppressants: Secondary | ICD-10-CM | POA: Diagnosis not present

## 2022-07-01 DIAGNOSIS — Z886 Allergy status to analgesic agent status: Secondary | ICD-10-CM | POA: Diagnosis not present

## 2022-07-01 DIAGNOSIS — C61 Malignant neoplasm of prostate: Secondary | ICD-10-CM

## 2022-07-01 DIAGNOSIS — D649 Anemia, unspecified: Secondary | ICD-10-CM | POA: Diagnosis not present

## 2022-07-01 DIAGNOSIS — Z79624 Long term (current) use of inhibitors of nucleotide synthesis: Secondary | ICD-10-CM | POA: Diagnosis not present

## 2022-07-01 DIAGNOSIS — Z5112 Encounter for antineoplastic immunotherapy: Secondary | ICD-10-CM | POA: Diagnosis not present

## 2022-07-01 DIAGNOSIS — C9002 Multiple myeloma in relapse: Secondary | ICD-10-CM | POA: Diagnosis not present

## 2022-07-01 DIAGNOSIS — Z79899 Other long term (current) drug therapy: Secondary | ICD-10-CM | POA: Diagnosis not present

## 2022-07-01 LAB — CBC WITH DIFFERENTIAL (CANCER CENTER ONLY)
Abs Immature Granulocytes: 0.01 10*3/uL (ref 0.00–0.07)
Basophils Absolute: 0 10*3/uL (ref 0.0–0.1)
Basophils Relative: 0 %
Eosinophils Absolute: 0 10*3/uL (ref 0.0–0.5)
Eosinophils Relative: 1 %
HCT: 34.8 % — ABNORMAL LOW (ref 39.0–52.0)
Hemoglobin: 12 g/dL — ABNORMAL LOW (ref 13.0–17.0)
Immature Granulocytes: 0 %
Lymphocytes Relative: 47 %
Lymphs Abs: 1.9 10*3/uL (ref 0.7–4.0)
MCH: 32.9 pg (ref 26.0–34.0)
MCHC: 34.5 g/dL (ref 30.0–36.0)
MCV: 95.3 fL (ref 80.0–100.0)
Monocytes Absolute: 0.3 10*3/uL (ref 0.1–1.0)
Monocytes Relative: 7 %
Neutro Abs: 1.8 10*3/uL (ref 1.7–7.7)
Neutrophils Relative %: 45 %
Platelet Count: 172 10*3/uL (ref 150–400)
RBC: 3.65 MIL/uL — ABNORMAL LOW (ref 4.22–5.81)
RDW: 12.8 % (ref 11.5–15.5)
WBC Count: 4.1 10*3/uL (ref 4.0–10.5)
nRBC: 0 % (ref 0.0–0.2)

## 2022-07-01 LAB — CMP (CANCER CENTER ONLY)
ALT: 11 U/L (ref 0–44)
AST: 16 U/L (ref 15–41)
Albumin: 4 g/dL (ref 3.5–5.0)
Alkaline Phosphatase: 73 U/L (ref 38–126)
Anion gap: 3 — ABNORMAL LOW (ref 5–15)
BUN: 19 mg/dL (ref 8–23)
CO2: 27 mmol/L (ref 22–32)
Calcium: 9.2 mg/dL (ref 8.9–10.3)
Chloride: 105 mmol/L (ref 98–111)
Creatinine: 0.87 mg/dL (ref 0.61–1.24)
GFR, Estimated: >60 mL/min (ref >60)
Glucose, Bld: 90 mg/dL (ref 70–99)
Potassium: 4.3 mmol/L (ref 3.5–5.1)
Sodium: 135 mmol/L (ref 135–145)
Total Bilirubin: 0.3 mg/dL (ref 0.3–1.2)
Total Protein: 6.5 g/dL (ref 6.5–8.1)

## 2022-07-01 MED ORDER — ACETAMINOPHEN 325 MG PO TABS
650.0000 mg | ORAL_TABLET | Freq: Once | ORAL | Status: AC
  Administered 2022-07-01: 650 mg via ORAL
  Filled 2022-07-01: qty 2

## 2022-07-01 MED ORDER — DIPHENHYDRAMINE HCL 25 MG PO CAPS
50.0000 mg | ORAL_CAPSULE | Freq: Once | ORAL | Status: AC
  Administered 2022-07-01: 50 mg via ORAL
  Filled 2022-07-01: qty 2

## 2022-07-01 MED ORDER — DEXAMETHASONE 4 MG PO TABS
8.0000 mg | ORAL_TABLET | Freq: Once | ORAL | Status: AC
  Administered 2022-07-01: 8 mg via ORAL
  Filled 2022-07-01: qty 2

## 2022-07-01 MED ORDER — DARATUMUMAB-HYALURONIDASE-FIHJ 1800-30000 MG-UT/15ML ~~LOC~~ SOLN
1800.0000 mg | Freq: Once | SUBCUTANEOUS | Status: AC
  Administered 2022-07-01: 1800 mg via SUBCUTANEOUS
  Filled 2022-07-01: qty 15

## 2022-07-01 MED ORDER — HEPARIN SOD (PORK) LOCK FLUSH 100 UNIT/ML IV SOLN
500.0000 [IU] | Freq: Once | INTRAVENOUS | Status: AC | PRN
  Administered 2022-07-01: 500 [IU]

## 2022-07-01 MED ORDER — ACYCLOVIR 400 MG PO TABS
400.0000 mg | ORAL_TABLET | Freq: Two times a day (BID) | ORAL | 3 refills | Status: DC
  Filled 2022-07-01: qty 60, 30d supply, fill #0

## 2022-07-01 MED ORDER — SODIUM CHLORIDE 0.9% FLUSH
10.0000 mL | INTRAVENOUS | Status: DC | PRN
  Administered 2022-07-01: 10 mL

## 2022-07-01 NOTE — Patient Instructions (Signed)
[facility]  Discharge Instructions: Thank you for choosing [facility] to provide your oncology and hematology care.   If you have a lab appointment with the [facility], please go directly to the [facility] and check in at the registration area.   Wear comfortable clothing and clothing appropriate for easy access to any Portacath or PICC line.   We strive to give you quality time with your provider. You may need to reschedule your appointment if you arrive late (15 or more minutes).  Arriving late affects you and other patients whose appointments are after yours.  Also, if you miss three or more appointments without notifying the office, you may be dismissed from the clinic at the provider's discretion.      For prescription refill requests, have your pharmacy contact our office and allow 72 hours for refills to be completed.    Today you received the following chemotherapy and/or immunotherapy agents: Darzalex Faspro      To help prevent nausea and vomiting after your treatment, we encourage you to take your nausea medication as directed.  BELOW ARE SYMPTOMS THAT SHOULD BE REPORTED IMMEDIATELY: *FEVER GREATER THAN 100.4 F (38 C) OR HIGHER *CHILLS OR SWEATING *NAUSEA AND VOMITING THAT IS NOT CONTROLLED WITH YOUR NAUSEA MEDICATION *UNUSUAL SHORTNESS OF BREATH *UNUSUAL BRUISING OR BLEEDING *URINARY PROBLEMS (pain or burning when urinating, or frequent urination) *BOWEL PROBLEMS (unusual diarrhea, constipation, pain near the anus) TENDERNESS IN MOUTH AND THROAT WITH OR WITHOUT PRESENCE OF ULCERS (sore throat, sores in mouth, or a toothache) UNUSUAL RASH, SWELLING OR PAIN  UNUSUAL VAGINAL DISCHARGE OR ITCHING   Items with * indicate a potential emergency and should be followed up as soon as possible or go to the Emergency Department if any problems should occur.  Please show the CHEMOTHERAPY ALERT CARD or IMMUNOTHERAPY ALERT CARD at  check-in to the Emergency Department and triage nurse.  Should you have questions after your visit or need to cancel or reschedule your appointment, please contact [facility]  Dept: [phone]  and follow the prompts.  Office hours are 8:00 a.m. to 4:30 p.m. Monday - Friday. Please note that voicemails left after 4:00 p.m. may not be returned until the following business day.  We are closed weekends and major holidays. You have access to a nurse at all times for urgent questions. Please call the main number to the clinic Dept: [phone] and follow the prompts.   For any non-urgent questions, you may also contact your provider using MyChart. We now offer e-Visits for anyone 80 and older to request care online for non-urgent symptoms. For details visit [email or web address].   Also download the MyChart app! Go to the app store, search "MyChart", open the app, select [facility], and log in with your MyChart username and password.  Masks are optional in the cancer centers. If you would like for your care team to wear a mask while they are taking care of you, please let them know. You may have one support person who is at least 69 years old accompany you for your appointments.

## 2022-07-02 ENCOUNTER — Other Ambulatory Visit: Payer: Self-pay

## 2022-07-03 LAB — MULTIPLE MYELOMA PANEL, SERUM
Albumin SerPl Elph-Mcnc: 3.7 g/dL (ref 2.9–4.4)
Albumin/Glob SerPl: 1.4 (ref 0.7–1.7)
Alpha 1: 0.2 g/dL (ref 0.0–0.4)
Alpha2 Glob SerPl Elph-Mcnc: 0.5 g/dL (ref 0.4–1.0)
B-Globulin SerPl Elph-Mcnc: 0.8 g/dL (ref 0.7–1.3)
Gamma Glob SerPl Elph-Mcnc: 1.2 g/dL (ref 0.4–1.8)
Globulin, Total: 2.8 g/dL (ref 2.2–3.9)
IgA: 23 mg/dL — ABNORMAL LOW (ref 61–437)
IgG (Immunoglobin G), Serum: 1259 mg/dL (ref 603–1613)
IgM (Immunoglobulin M), Srm: 16 mg/dL — ABNORMAL LOW (ref 20–172)
M Protein SerPl Elph-Mcnc: 0.9 g/dL — ABNORMAL HIGH
Total Protein ELP: 6.5 g/dL (ref 6.0–8.5)

## 2022-07-05 LAB — KAPPA/LAMBDA LIGHT CHAINS
Kappa free light chain: 25.9 mg/L — ABNORMAL HIGH (ref 3.3–19.4)
Kappa, lambda light chain ratio: 8.63 — ABNORMAL HIGH (ref 0.26–1.65)
Lambda free light chains: 3 mg/L — ABNORMAL LOW (ref 5.7–26.3)

## 2022-07-09 ENCOUNTER — Other Ambulatory Visit (HOSPITAL_COMMUNITY): Payer: Self-pay

## 2022-07-17 ENCOUNTER — Telehealth: Payer: Self-pay | Admitting: Internal Medicine

## 2022-07-17 NOTE — Telephone Encounter (Signed)
Called patient regarding upcoming appointments, I was unable to leave a voicemail. Calendar will be mailed.

## 2022-07-22 ENCOUNTER — Encounter: Payer: Self-pay | Admitting: Oncology

## 2022-07-29 ENCOUNTER — Encounter: Payer: Self-pay | Admitting: Oncology

## 2022-07-29 ENCOUNTER — Inpatient Hospital Stay (HOSPITAL_BASED_OUTPATIENT_CLINIC_OR_DEPARTMENT_OTHER): Payer: 59 | Admitting: Oncology

## 2022-07-29 ENCOUNTER — Inpatient Hospital Stay: Payer: 59

## 2022-07-29 ENCOUNTER — Inpatient Hospital Stay: Payer: 59 | Attending: Oncology

## 2022-07-29 ENCOUNTER — Other Ambulatory Visit (HOSPITAL_COMMUNITY): Payer: Self-pay

## 2022-07-29 ENCOUNTER — Other Ambulatory Visit: Payer: Self-pay

## 2022-07-29 VITALS — BP 159/92 | HR 60 | Temp 97.9°F | Resp 16 | Ht 73.0 in | Wt 151.8 lb

## 2022-07-29 DIAGNOSIS — C9001 Multiple myeloma in remission: Secondary | ICD-10-CM | POA: Diagnosis not present

## 2022-07-29 DIAGNOSIS — C9002 Multiple myeloma in relapse: Secondary | ICD-10-CM | POA: Insufficient documentation

## 2022-07-29 DIAGNOSIS — D649 Anemia, unspecified: Secondary | ICD-10-CM | POA: Diagnosis not present

## 2022-07-29 DIAGNOSIS — Z79899 Other long term (current) drug therapy: Secondary | ICD-10-CM | POA: Diagnosis not present

## 2022-07-29 DIAGNOSIS — Z5112 Encounter for antineoplastic immunotherapy: Secondary | ICD-10-CM | POA: Diagnosis not present

## 2022-07-29 DIAGNOSIS — Z886 Allergy status to analgesic agent status: Secondary | ICD-10-CM | POA: Diagnosis not present

## 2022-07-29 DIAGNOSIS — Z95828 Presence of other vascular implants and grafts: Secondary | ICD-10-CM

## 2022-07-29 DIAGNOSIS — C61 Malignant neoplasm of prostate: Secondary | ICD-10-CM

## 2022-07-29 LAB — CBC WITH DIFFERENTIAL (CANCER CENTER ONLY)
Abs Immature Granulocytes: 0.01 10*3/uL (ref 0.00–0.07)
Basophils Absolute: 0 10*3/uL (ref 0.0–0.1)
Basophils Relative: 1 %
Eosinophils Absolute: 0 10*3/uL (ref 0.0–0.5)
Eosinophils Relative: 1 %
HCT: 35 % — ABNORMAL LOW (ref 39.0–52.0)
Hemoglobin: 12.2 g/dL — ABNORMAL LOW (ref 13.0–17.0)
Immature Granulocytes: 0 %
Lymphocytes Relative: 43 %
Lymphs Abs: 1.9 10*3/uL (ref 0.7–4.0)
MCH: 32.9 pg (ref 26.0–34.0)
MCHC: 34.9 g/dL (ref 30.0–36.0)
MCV: 94.3 fL (ref 80.0–100.0)
Monocytes Absolute: 0.3 10*3/uL (ref 0.1–1.0)
Monocytes Relative: 6 %
Neutro Abs: 2.2 10*3/uL (ref 1.7–7.7)
Neutrophils Relative %: 49 %
Platelet Count: 153 10*3/uL (ref 150–400)
RBC: 3.71 MIL/uL — ABNORMAL LOW (ref 4.22–5.81)
RDW: 12.5 % (ref 11.5–15.5)
WBC Count: 4.4 10*3/uL (ref 4.0–10.5)
nRBC: 0 % (ref 0.0–0.2)

## 2022-07-29 LAB — CMP (CANCER CENTER ONLY)
ALT: 11 U/L (ref 0–44)
AST: 15 U/L (ref 15–41)
Albumin: 3.9 g/dL (ref 3.5–5.0)
Alkaline Phosphatase: 71 U/L (ref 38–126)
Anion gap: 5 (ref 5–15)
BUN: 16 mg/dL (ref 8–23)
CO2: 26 mmol/L (ref 22–32)
Calcium: 9 mg/dL (ref 8.9–10.3)
Chloride: 106 mmol/L (ref 98–111)
Creatinine: 0.83 mg/dL (ref 0.61–1.24)
GFR, Estimated: >60 mL/min (ref >60)
Glucose, Bld: 88 mg/dL (ref 70–99)
Potassium: 4.2 mmol/L (ref 3.5–5.1)
Sodium: 137 mmol/L (ref 135–145)
Total Bilirubin: 0.3 mg/dL (ref 0.3–1.2)
Total Protein: 6.8 g/dL (ref 6.5–8.1)

## 2022-07-29 MED ORDER — ACETAMINOPHEN 325 MG PO TABS
650.0000 mg | ORAL_TABLET | Freq: Once | ORAL | Status: AC
  Administered 2022-07-29: 650 mg via ORAL
  Filled 2022-07-29: qty 2

## 2022-07-29 MED ORDER — SODIUM CHLORIDE 0.9% FLUSH
10.0000 mL | INTRAVENOUS | Status: DC | PRN
  Administered 2022-07-29: 10 mL

## 2022-07-29 MED ORDER — DARATUMUMAB-HYALURONIDASE-FIHJ 1800-30000 MG-UT/15ML ~~LOC~~ SOLN
1800.0000 mg | Freq: Once | SUBCUTANEOUS | Status: AC
  Administered 2022-07-29: 1800 mg via SUBCUTANEOUS
  Filled 2022-07-29: qty 15

## 2022-07-29 MED ORDER — DEXAMETHASONE 4 MG PO TABS
8.0000 mg | ORAL_TABLET | Freq: Once | ORAL | Status: AC
  Administered 2022-07-29: 8 mg via ORAL
  Filled 2022-07-29: qty 2

## 2022-07-29 MED ORDER — DIPHENHYDRAMINE HCL 25 MG PO CAPS
50.0000 mg | ORAL_CAPSULE | Freq: Once | ORAL | Status: AC
  Administered 2022-07-29: 50 mg via ORAL
  Filled 2022-07-29: qty 2

## 2022-07-29 NOTE — Patient Instructions (Signed)
[facility]COL[facility]  Discharge Instructions: Thank you for choosing [facility] to provide your oncology and hematology care.   If you have a lab appointment with the [facility], please go directly to the [facility] and check in at the registration area.   Wear comfortable clothing and clothing appropriate for easy access to any Portacath or PICC line.   We strive to give you quality time with your provider. You may need to reschedule your appointment if you arrive late (15 or more minutes).  Arriving late affects you and other patients whose appointments are after yours.  Also, if you miss three or more appointments without notifying the office, you may be dismissed from the clinic at the provider's discretion.      For prescription refill requests, have your pharmacy contact our office and allow 72 hours for refills to be completed.    Today you received the following chemotherapy and/or immunotherapy agents: Dara Faspro.       To help prevent nausea and vomiting after your treatment, we encourage you to take your nausea medication as directed.  BELOW ARE SYMPTOMS THAT SHOULD BE REPORTED IMMEDIATELY: *FEVER GREATER THAN 100.4 F (38 C) OR HIGHER *CHILLS OR SWEATING *NAUSEA AND VOMITING THAT IS NOT CONTROLLED WITH YOUR NAUSEA MEDICATION *UNUSUAL SHORTNESS OF BREATH *UNUSUAL BRUISING OR BLEEDING *URINARY PROBLEMS (pain or burning when urinating, or frequent urination) *BOWEL PROBLEMS (unusual diarrhea, constipation, pain near the anus) TENDERNESS IN MOUTH AND THROAT WITH OR WITHOUT PRESENCE OF ULCERS (sore throat, sores in mouth, or a toothache) UNUSUAL RASH, SWELLING OR PAIN  UNUSUAL VAGINAL DISCHARGE OR ITCHING   Items with * indicate a potential emergency and should be followed up as soon as possible or go to the Emergency Department if any problems should occur.  Please show the CHEMOTHERAPY ALERT CARD or IMMUNOTHERAPY ALERT CARD at check-in  to the Emergency Department and triage nurse.  Should you have questions after your visit or need to cancel or reschedule your appointment, please contact [facility]  Dept: [phone]  and follow the prompts.  Office hours are 8:00 a.m. to 4:30 p.m. Monday - Friday. Please note that voicemails left after 4:00 p.m. may not be returned until the following business day.  We are closed weekends and major holidays. You have access to a nurse at all times for urgent questions. Please call the main number to the clinic Dept: [phone] and follow the prompts.   For any non-urgent questions, you may also contact your provider using MyChart. We now offer e-Visits for anyone 69 and older to request care online for non-urgent symptoms. For details visit [email or web address].   Also download the MyChart app! Go to the app store, search "MyChart", open the app, select [facility], and log in with your MyChart username and password.

## 2022-07-29 NOTE — Progress Notes (Signed)
Hematology and Oncology Follow Up Visit  Travis Nguyen [id number] [birth year] 72 y.o. 07/29/2022 8:00 AM Travis Nguyen, MDShadad, Travis Dad, MD   Principle Diagnosis: 76 year old man with multiple myeloma diagnosed in 2014.  He presented with IgG kappa disease that relapsed disease in 2022.    Secondary diagnosis: Stage a T1c, Gleason score 3+4 = 7 prostate cancer that is currently in remission.  Prior Therapy: He was treated initially with Cytoxan, Velcade and Decadron and subsequently his regimen changed to a Velcade, Revlimid with dexamethasone.  He achieved remission in 2014.   He is status post a robotic-assisted laparoscopic radical prostatectomy and bilateral lymph node dissection on 02/09/2014. The final pathology showed prostate adenocarcinoma Gleason score 4+3 equals 7 involving both lobes.    Carfilzomib, and dexamethasone, daratumumab started on July 28, 2020.  Dexamethasone and carfilzomib is weekly with daratumumab every 2 weeks.  Therapy concluded in December 2022 after achieving a partial response.  Current therapy: He is currently receiving Darzalex Faspro on a monthly maintenance with dexamethasone started in January 2023.  Interim History:  Travis Nguyen presents today for repeat evaluation.  Since the last visit, he reports feeling well without any major complaints.  He denies any complications related to his current treatment.  He denies any nausea, vomiting or abdominal pain.  He denies any hospitalizations or illnesses.  He denies any bone pain or pathological fractures.  He denies any constitutional symptoms.   Medications: Updated on review. Current Outpatient Medications  Medication Sig Dispense Refill   acyclovir (ZOVIRAX) 400 MG tablet Take 1 tablet (400 mg total) by mouth 2 (two) times daily. 60 tablet 3   amLODipine (NORVASC) 10 MG tablet Take 1 tablet by mouth once a day (Patient taking differently: Take 10 mg by mouth daily.) 90 tablet 1   amLODipine  (NORVASC) 5 MG tablet Take 1 tablet (5 mg total) by mouth daily. 90 tablet 1   cyclobenzaprine (FLEXERIL) 5 MG tablet Take 1 - 2 tablets by mouth at bedtime as needed 60 tablet 0   doxycycline (VIBRAMYCIN) 100 MG capsule Take 1 capsule by mouth 2 times daily. 20 capsule 0   escitalopram (LEXAPRO) 10 MG tablet Take 1 tablet by mouth once a day (Patient taking differently: Take 10 mg by mouth daily.) 90 tablet 1   escitalopram (LEXAPRO) 10 MG tablet Take 1 tablet by mouth once daily 90 tablet 1   escitalopram (LEXAPRO) 10 MG tablet Take 1 tablet (10 mg total) by mouth daily. 90 tablet 1   lidocaine-prilocaine (EMLA) cream APPLY TOPICALLY TO PORT-A-CATH DAILY AS NEEDED 30 g 2   losartan (COZAAR) 100 MG tablet TAKE 1 TABLET BY MOUTH ONCE A DAY 90 tablet 0   losartan (COZAAR) 100 MG tablet Take 1 tablet by mouth once a day (Patient taking differently: Take 100 mg by mouth daily.) 90 tablet 1   losartan (COZAAR) 100 MG tablet Take 1 tablet by mouth daily 90 tablet 1   losartan (COZAAR) 100 MG tablet Take 1 tablet (100 mg total) by mouth daily. 90 tablet 1   oxyCODONE-acetaminophen (PERCOCET) 10-325 MG tablet Take 1 tablet by mouth 2 times a day (Patient taking differently: Take 1 tablet by mouth 2 (two) times daily as needed for pain.) 60 tablet 0   oxyCODONE-acetaminophen (PERCOCET) 10-325 MG tablet Take 1 tablet by mouth twice daily as needed (07/05/21) 60 tablet 0   oxyCODONE-acetaminophen (PERCOCET) 10-325 MG tablet Take 1 tablet by mouth 2 (two) times daily as needed. [place]  tablet 0   oxyCODONE-acetaminophen (PERCOCET) 10-325 MG tablet Take 1 tablet by mouth 2 times a day as needed orally (ok to fill 30 days after last refill) 60 tablet 0   oxyCODONE-acetaminophen (PERCOCET) 10-325 MG tablet Take 1 tablet by mouth 2 (two) times daily as needed. 60 tablet 0   oxyCODONE-acetaminophen (PERCOCET) 10-325 MG tablet Take 1 tablet by mouth 2 (two) times daily. okay to fill 30 days after last refill 60 tablet 0    oxyCODONE-acetaminophen (PERCOCET) 10-325 MG tablet Take 1 tablet by mouth 2 (two) times daily as needed. (ok to fill 30 days after last refill) 60 tablet 0   pantoprazole (PROTONIX) 20 MG tablet Take 1 tablet (20 mg total) by mouth daily. 14 tablet 0   vitamin C (ASCORBIC ACID) 500 MG tablet Take 500 mg by mouth daily.     No current facility-administered medications for this visit.     Allergies:  Allergies  Allergen Reactions   Aspirin     Pt stated "raises BP"        Physical Exam:   Blood pressure (!) 159/92, pulse 60, temperature 97.9 F (36.6 C), temperature source Temporal, resp. rate 16, height '6\' 1"'$  (1.854 m), weight 151 lb 12.8 oz (68.9 kg), SpO2 100 %.     ECOG: 1    General appearance: Comfortable appearing without any discomfort Head: Normocephalic without any trauma Oropharynx: Mucous membranes are moist and pink without any thrush or ulcers. Eyes: Pupils are equal and round reactive to light. Lymph nodes: No cervical, supraclavicular, inguinal or axillary lymphadenopathy.   Heart:regular rate and rhythm.  S1 and S2 without leg edema. Lung: Clear without any rhonchi or wheezes.  No dullness to percussion. Abdomin: Soft, nontender, nondistended with good bowel sounds.  No hepatosplenomegaly. Musculoskeletal: No joint deformity or effusion.  Full range of motion noted. Neurological: No deficits noted on motor, sensory and deep tendon reflex exam. Skin: No petechial rash or dryness.  Appeared moist.                         Lab Results: Lab Results  Component Value Date   WBC 4.1 07/01/2022   HGB 12.0 (L) 07/01/2022   HCT 34.8 (L) 07/01/2022   MCV 95.3 07/01/2022   PLT 172 07/01/2022   PSA 14.27 (H) 01/28/2013     Chemistry      Component Value Date/Time   NA 135 07/01/2022 1359   NA 138 03/27/2017 0838   K 4.3 07/01/2022 1359   K 4.3 03/27/2017 0838   CL 105 07/01/2022 1359   CL 105 01/01/2013 0835   CO2 27 07/01/2022  1359   CO2 24 03/27/2017 0838   BUN 19 07/01/2022 1359   BUN 23.7 03/27/2017 0838   CREATININE 0.87 07/01/2022 1359   CREATININE 1.2 03/27/2017 0838      Component Value Date/Time   CALCIUM 9.2 07/01/2022 1359   CALCIUM 9.9 03/27/2017 0838   ALKPHOS 73 07/01/2022 1359   ALKPHOS 79 03/27/2017 0838   AST 16 07/01/2022 1359   AST 25 03/27/2017 0838   ALT 11 07/01/2022 1359   ALT 26 03/27/2017 0838   BILITOT 0.3 07/01/2022 1359   BILITOT 0.31 03/27/2017 0838      Latest Reference Range & Units 03/07/22 08:28 04/04/22 08:31 05/02/22 08:00 05/31/22 13:21 07/01/22 13:59  M Protein SerPl Elph-Mcnc Not Observed g/dL 0.9 (H) (C) 0.7 (H) (C) 0.9 (H) (C) 1.0 (H) (C) 0.9 (H) (  C)  IFE 1  Comment ! (C) Comment ! (C) Comment ! (C) Comment ! (C) Comment ! (C)  Globulin, Total 2.2 - 3.9 g/dL 2.8 (C) 2.9 (C) 2.9 (C) 2.8 (C) 2.8 (C)  B-Globulin SerPl Elph-Mcnc 0.7 - 1.3 g/dL 0.8 (C) 0.9 (C) 0.8 (C) 0.7 (C) 0.8 (C)  IgG (Immunoglobin G), Serum 603 - 1,613 mg/dL 1,286 1,323 1,297 1,328 1,259  IgM (Immunoglobulin M), Srm 20 - 172 mg/dL 21 18 (L) 18 (L) 19 (L) 16 (L)  IgA 61 - 437 mg/dL 29 (L) 25 (L) 26 (L) 26 (L) 23 (L)  (H): Data is abnormally high !: Data is abnormal (L): Data is abnormally low (C): Corrected   Impression and Plan:  72 year old man with:   1.  Multiple myeloma diagnosed in 2014 with relapsed disease in 2022.  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   He can continue to be on daratumumab maintenance after achieving a very good partial response.  Protein studies in the last year reviewed which showed fluctuation and mild rise up to 1 g/dL M spike with normal IgG level.  He remains asymptomatic without any endorgan damage including normal hemoglobin and kidney function.  Salvage therapy options at this time were discussed including traditional salvage options  such as SLAMF7 targeting agents, CAR-T as well as my specific B-cell maturation antigen therapy (BCMA).  His performance  status remains reasonable and aggressive measures are currently limited.  For the time being I recommended continued daratumumab therapy close surveillance.  2.  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   3.  IV access: Port-A-Cath continues to be in use at this time.   4.  VZV prophylaxis: No reactivation at this time.  He continues to be on acyclovir.  5. Followup: In 4 weeks to update his protein studies as well as next cycle of therapy.  30  minutes were spent on this encounter.  The time was dedicated to updating disease status, treatment choices and outlining future plan of care review.   Zola Button, MD 1/15/20248:00 AM

## 2022-07-30 ENCOUNTER — Other Ambulatory Visit (HOSPITAL_COMMUNITY): Payer: Self-pay

## 2022-07-30 LAB — KAPPA/LAMBDA LIGHT CHAINS
Kappa free light chain: 28.3 mg/L — ABNORMAL HIGH (ref 3.3–19.4)
Kappa, lambda light chain ratio: 8.84 — ABNORMAL HIGH (ref 0.26–1.65)
Lambda free light chains: 3.2 mg/L — ABNORMAL LOW (ref 5.7–26.3)

## 2022-07-30 MED ORDER — OXYCODONE-ACETAMINOPHEN 10-325 MG PO TABS
1.0000 | ORAL_TABLET | Freq: Two times a day (BID) | ORAL | 0 refills | Status: DC
  Filled 2022-07-31: qty 1, 1d supply, fill #0

## 2022-07-31 ENCOUNTER — Other Ambulatory Visit (HOSPITAL_COMMUNITY): Payer: Self-pay

## 2022-07-31 ENCOUNTER — Other Ambulatory Visit: Payer: Self-pay

## 2022-07-31 MED ORDER — OXYCODONE-ACETAMINOPHEN 10-325 MG PO TABS
1.0000 | ORAL_TABLET | Freq: Two times a day (BID) | ORAL | 0 refills | Status: DC
  Filled 2022-07-31 (×2): qty 60, 30d supply, fill #0

## 2022-08-01 ENCOUNTER — Other Ambulatory Visit: Payer: Self-pay

## 2022-08-02 ENCOUNTER — Other Ambulatory Visit: Payer: Self-pay

## 2022-08-02 DIAGNOSIS — H43393 Other vitreous opacities, bilateral: Secondary | ICD-10-CM | POA: Diagnosis not present

## 2022-08-02 DIAGNOSIS — H40013 Open angle with borderline findings, low risk, bilateral: Secondary | ICD-10-CM | POA: Diagnosis not present

## 2022-08-02 DIAGNOSIS — H2513 Age-related nuclear cataract, bilateral: Secondary | ICD-10-CM | POA: Diagnosis not present

## 2022-08-02 LAB — MULTIPLE MYELOMA PANEL, SERUM
Albumin SerPl Elph-Mcnc: 3.7 g/dL (ref 2.9–4.4)
Albumin/Glob SerPl: 1.4 (ref 0.7–1.7)
Alpha 1: 0.2 g/dL (ref 0.0–0.4)
Alpha2 Glob SerPl Elph-Mcnc: 0.6 g/dL (ref 0.4–1.0)
B-Globulin SerPl Elph-Mcnc: 0.8 g/dL (ref 0.7–1.3)
Gamma Glob SerPl Elph-Mcnc: 1.2 g/dL (ref 0.4–1.8)
Globulin, Total: 2.8 g/dL (ref 2.2–3.9)
IgA: 23 mg/dL — ABNORMAL LOW (ref 61–437)
IgG (Immunoglobin G), Serum: 1337 mg/dL (ref 603–1613)
IgM (Immunoglobulin M), Srm: 14 mg/dL — ABNORMAL LOW (ref 20–172)
M Protein SerPl Elph-Mcnc: 0.8 g/dL — ABNORMAL HIGH
Total Protein ELP: 6.5 g/dL (ref 6.0–8.5)

## 2022-08-09 ENCOUNTER — Encounter: Payer: Self-pay | Admitting: Oncology

## 2022-08-11 ENCOUNTER — Other Ambulatory Visit: Payer: Self-pay

## 2022-08-12 ENCOUNTER — Encounter: Payer: Self-pay | Admitting: Oncology

## 2022-08-12 ENCOUNTER — Other Ambulatory Visit (HOSPITAL_COMMUNITY): Payer: Self-pay

## 2022-08-12 DIAGNOSIS — I714 Abdominal aortic aneurysm, without rupture, unspecified: Secondary | ICD-10-CM | POA: Diagnosis not present

## 2022-08-12 DIAGNOSIS — Z0000 Encounter for general adult medical examination without abnormal findings: Secondary | ICD-10-CM | POA: Diagnosis not present

## 2022-08-12 DIAGNOSIS — Z23 Encounter for immunization: Secondary | ICD-10-CM | POA: Diagnosis not present

## 2022-08-12 DIAGNOSIS — D84821 Immunodeficiency due to drugs: Secondary | ICD-10-CM | POA: Diagnosis not present

## 2022-08-12 DIAGNOSIS — Z1211 Encounter for screening for malignant neoplasm of colon: Secondary | ICD-10-CM | POA: Diagnosis not present

## 2022-08-12 DIAGNOSIS — E7800 Pure hypercholesterolemia, unspecified: Secondary | ICD-10-CM | POA: Diagnosis not present

## 2022-08-12 DIAGNOSIS — I10 Essential (primary) hypertension: Secondary | ICD-10-CM | POA: Diagnosis not present

## 2022-08-12 DIAGNOSIS — C9000 Multiple myeloma not having achieved remission: Secondary | ICD-10-CM | POA: Diagnosis not present

## 2022-08-12 DIAGNOSIS — M5416 Radiculopathy, lumbar region: Secondary | ICD-10-CM | POA: Diagnosis not present

## 2022-08-12 MED ORDER — LOSARTAN POTASSIUM 100 MG PO TABS
100.0000 mg | ORAL_TABLET | Freq: Every day | ORAL | 1 refills | Status: DC
  Filled 2022-08-12: qty 90, 90d supply, fill #0

## 2022-08-12 MED ORDER — ESCITALOPRAM OXALATE 10 MG PO TABS
10.0000 mg | ORAL_TABLET | Freq: Every day | ORAL | 1 refills | Status: DC
  Filled 2022-08-12: qty 90, 90d supply, fill #0

## 2022-08-12 MED ORDER — AMLODIPINE BESYLATE 5 MG PO TABS
5.0000 mg | ORAL_TABLET | Freq: Every day | ORAL | 1 refills | Status: DC
  Filled 2022-08-12: qty 90, 90d supply, fill #0
  Filled 2022-11-22: qty 90, 90d supply, fill #1

## 2022-08-13 ENCOUNTER — Other Ambulatory Visit: Payer: Self-pay | Admitting: Family Medicine

## 2022-08-13 ENCOUNTER — Other Ambulatory Visit (HOSPITAL_COMMUNITY): Payer: Self-pay

## 2022-08-13 DIAGNOSIS — I714 Abdominal aortic aneurysm, without rupture, unspecified: Secondary | ICD-10-CM

## 2022-08-16 ENCOUNTER — Other Ambulatory Visit: Payer: Self-pay

## 2022-08-21 ENCOUNTER — Other Ambulatory Visit (HOSPITAL_COMMUNITY): Payer: Self-pay

## 2022-08-21 ENCOUNTER — Ambulatory Visit (INDEPENDENT_AMBULATORY_CARE_PROVIDER_SITE_OTHER): Payer: 59 | Admitting: Podiatry

## 2022-08-21 VITALS — BP 174/86

## 2022-08-21 DIAGNOSIS — M79675 Pain in left toe(s): Secondary | ICD-10-CM

## 2022-08-21 DIAGNOSIS — M79674 Pain in right toe(s): Secondary | ICD-10-CM | POA: Diagnosis not present

## 2022-08-21 DIAGNOSIS — L989 Disorder of the skin and subcutaneous tissue, unspecified: Secondary | ICD-10-CM

## 2022-08-21 DIAGNOSIS — B351 Tinea unguium: Secondary | ICD-10-CM | POA: Diagnosis not present

## 2022-08-21 NOTE — Progress Notes (Signed)
   Chief Complaint  Patient presents with   Callouses    Bilateal callus, right foot 5th toe corn     SUBJECTIVE 72 year old male PMHx degenerative disc disease unable to trim his feet presents to office today complaining of elongated, thickened nails that cause pain while ambulating in shoes.  Patient is unable to trim their own nails.  Patient also complains of symptomatic corns to the distal tips of the toes bilateral.  Patient is here for further evaluation and treatment.  Past Medical History:  Diagnosis Date   Anemia    hx of    Arthritis    DJD lower back   Back pain    COVID-19    Gall stones    hx of   Heart murmur    asymptomatic    Hypertension    Melanoma ([place])    does not have melanoma!!! (per pt)   Multiple myeloma ([place]) dx'd 10/2009   chemo   Pneumonia    x1   Prostate cancer ([place]) 11/2012   gleason 3+4=7, volume 24 gm   Sinus problem     Allergies  Allergen Reactions   Aspirin     Pt stated "raises BP"      OBJECTIVE General Patient is awake, alert, and oriented x 3 and in no acute distress. Derm Skin is dry and supple bilateral. Negative open lesions or macerations. Remaining integument unremarkable. Nails are tender, long, thickened and dystrophic with subungual debris, consistent with onychomycosis, 1-5 bilateral. No signs of infection noted.  Hyperkeratotic symptomatic skin calluses also noted to the tips of the toes bilaterally Vasc  DP and PT pedal pulses palpable bilaterally. Temperature gradient within normal limits.  Neuro Epicritic and protective threshold sensation grossly intact bilaterally.  Musculoskeletal Exam No symptomatic pedal deformities noted bilateral. Muscular strength within normal limits.  ASSESSMENT 1.  Pain due to onychomycosis of toenails both 2.  Symptomatic calluses bilateral feet  PLAN OF CARE 1. Patient evaluated today.  2. Instructed to maintain good pedal hygiene and foot care.  3. Mechanical debridement of nails  1-5 bilaterally performed using a nail nipper. Filed with dremel without incident.  4.  Excisional debridement of the hyperkeratotic callus was performed today using a tissue nipper and 312 scalpel without incident.  Patient felt immediate relief.   5.  Return to clinic in 3 mos.    Edrick Kins, DPM [facility]  Dr. Edrick Kins, DPM    [address], [place] [address]                Office [phone]  Fax [phone]

## 2022-08-25 ENCOUNTER — Encounter: Payer: Self-pay | Admitting: Hematology

## 2022-08-29 ENCOUNTER — Other Ambulatory Visit (HOSPITAL_COMMUNITY): Payer: Self-pay

## 2022-08-29 ENCOUNTER — Other Ambulatory Visit: Payer: Self-pay

## 2022-08-29 MED ORDER — OXYCODONE-ACETAMINOPHEN 10-325 MG PO TABS
ORAL_TABLET | ORAL | 0 refills | Status: DC
  Filled 2022-08-29: qty 60, 30d supply, fill #0

## 2022-08-30 ENCOUNTER — Inpatient Hospital Stay: Payer: 59

## 2022-08-30 ENCOUNTER — Other Ambulatory Visit: Payer: Self-pay

## 2022-08-30 ENCOUNTER — Inpatient Hospital Stay: Payer: 59 | Attending: Oncology | Admitting: Hematology

## 2022-08-30 VITALS — BP 167/93 | HR 68 | Temp 97.7°F | Resp 20 | Wt 152.8 lb

## 2022-08-30 DIAGNOSIS — C9001 Multiple myeloma in remission: Secondary | ICD-10-CM

## 2022-08-30 DIAGNOSIS — Z886 Allergy status to analgesic agent status: Secondary | ICD-10-CM | POA: Diagnosis not present

## 2022-08-30 DIAGNOSIS — Z9049 Acquired absence of other specified parts of digestive tract: Secondary | ICD-10-CM | POA: Diagnosis not present

## 2022-08-30 DIAGNOSIS — Z8042 Family history of malignant neoplasm of prostate: Secondary | ICD-10-CM | POA: Insufficient documentation

## 2022-08-30 DIAGNOSIS — Z79624 Long term (current) use of inhibitors of nucleotide synthesis: Secondary | ICD-10-CM | POA: Diagnosis not present

## 2022-08-30 DIAGNOSIS — Z8249 Family history of ischemic heart disease and other diseases of the circulatory system: Secondary | ICD-10-CM | POA: Diagnosis not present

## 2022-08-30 DIAGNOSIS — C9002 Multiple myeloma in relapse: Secondary | ICD-10-CM | POA: Insufficient documentation

## 2022-08-30 DIAGNOSIS — Z79899 Other long term (current) drug therapy: Secondary | ICD-10-CM | POA: Insufficient documentation

## 2022-08-30 DIAGNOSIS — Z95828 Presence of other vascular implants and grafts: Secondary | ICD-10-CM

## 2022-08-30 DIAGNOSIS — Z85820 Personal history of malignant melanoma of skin: Secondary | ICD-10-CM | POA: Insufficient documentation

## 2022-08-30 DIAGNOSIS — Z7969 Long term (current) use of other immunomodulators and immunosuppressants: Secondary | ICD-10-CM | POA: Diagnosis not present

## 2022-08-30 DIAGNOSIS — C9000 Multiple myeloma not having achieved remission: Secondary | ICD-10-CM

## 2022-08-30 DIAGNOSIS — C61 Malignant neoplasm of prostate: Secondary | ICD-10-CM

## 2022-08-30 DIAGNOSIS — Z9079 Acquired absence of other genital organ(s): Secondary | ICD-10-CM | POA: Diagnosis not present

## 2022-08-30 DIAGNOSIS — Z79620 Long term (current) use of immunosuppressive biologic: Secondary | ICD-10-CM | POA: Diagnosis not present

## 2022-08-30 DIAGNOSIS — Z5112 Encounter for antineoplastic immunotherapy: Secondary | ICD-10-CM | POA: Insufficient documentation

## 2022-08-30 DIAGNOSIS — Z8616 Personal history of COVID-19: Secondary | ICD-10-CM | POA: Insufficient documentation

## 2022-08-30 LAB — CMP (CANCER CENTER ONLY)
ALT: 13 U/L (ref 0–44)
AST: 16 U/L (ref 15–41)
Albumin: 3.9 g/dL (ref 3.5–5.0)
Alkaline Phosphatase: 75 U/L (ref 38–126)
Anion gap: 3 — ABNORMAL LOW (ref 5–15)
BUN: 15 mg/dL (ref 8–23)
CO2: 28 mmol/L (ref 22–32)
Calcium: 8.5 mg/dL — ABNORMAL LOW (ref 8.9–10.3)
Chloride: 106 mmol/L (ref 98–111)
Creatinine: 0.94 mg/dL (ref 0.61–1.24)
GFR, Estimated: >60 mL/min (ref >60)
Glucose, Bld: 105 mg/dL — ABNORMAL HIGH (ref 70–99)
Potassium: 4.2 mmol/L (ref 3.5–5.1)
Sodium: 137 mmol/L (ref 135–145)
Total Bilirubin: 0.3 mg/dL (ref 0.3–1.2)
Total Protein: 6.6 g/dL (ref 6.5–8.1)

## 2022-08-30 LAB — CBC WITH DIFFERENTIAL (CANCER CENTER ONLY)
Abs Immature Granulocytes: 0.01 10*3/uL (ref 0.00–0.07)
Basophils Absolute: 0 10*3/uL (ref 0.0–0.1)
Basophils Relative: 0 %
Eosinophils Absolute: 0 10*3/uL (ref 0.0–0.5)
Eosinophils Relative: 0 %
HCT: 33.5 % — ABNORMAL LOW (ref 39.0–52.0)
Hemoglobin: 11.8 g/dL — ABNORMAL LOW (ref 13.0–17.0)
Immature Granulocytes: 0 %
Lymphocytes Relative: 35 %
Lymphs Abs: 1.7 10*3/uL (ref 0.7–4.0)
MCH: 33.1 pg (ref 26.0–34.0)
MCHC: 35.2 g/dL (ref 30.0–36.0)
MCV: 94.1 fL (ref 80.0–100.0)
Monocytes Absolute: 0.3 10*3/uL (ref 0.1–1.0)
Monocytes Relative: 6 %
Neutro Abs: 2.7 10*3/uL (ref 1.7–7.7)
Neutrophils Relative %: 59 %
Platelet Count: 170 10*3/uL (ref 150–400)
RBC: 3.56 MIL/uL — ABNORMAL LOW (ref 4.22–5.81)
RDW: 12.5 % (ref 11.5–15.5)
WBC Count: 4.8 10*3/uL (ref 4.0–10.5)
nRBC: 0 % (ref 0.0–0.2)

## 2022-08-30 MED ORDER — DIPHENHYDRAMINE HCL 25 MG PO CAPS
50.0000 mg | ORAL_CAPSULE | Freq: Once | ORAL | Status: AC
  Administered 2022-08-30: 50 mg via ORAL
  Filled 2022-08-30: qty 2

## 2022-08-30 MED ORDER — DARATUMUMAB-HYALURONIDASE-FIHJ 1800-30000 MG-UT/15ML ~~LOC~~ SOLN
1800.0000 mg | Freq: Once | SUBCUTANEOUS | Status: AC
  Administered 2022-08-30: 1800 mg via SUBCUTANEOUS
  Filled 2022-08-30: qty 15

## 2022-08-30 MED ORDER — ACETAMINOPHEN 325 MG PO TABS
650.0000 mg | ORAL_TABLET | Freq: Once | ORAL | Status: AC
  Administered 2022-08-30: 650 mg via ORAL
  Filled 2022-08-30: qty 2

## 2022-08-30 MED ORDER — SODIUM CHLORIDE 0.9% FLUSH
10.0000 mL | INTRAVENOUS | Status: DC | PRN
  Administered 2022-08-30: 10 mL

## 2022-08-30 MED ORDER — DEXAMETHASONE 4 MG PO TABS
8.0000 mg | ORAL_TABLET | Freq: Once | ORAL | Status: AC
  Administered 2022-08-30: 8 mg via ORAL
  Filled 2022-08-30: qty 2
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
## 2022-08-30 NOTE — Patient Instructions (Signed)
[facility]  Discharge Instructions: Thank you for choosing [facility] to provide your oncology and hematology care.   If you have a lab appointment with the [facility], please go directly to the [facility] and check in at the registration area.   Wear comfortable clothing and clothing appropriate for easy access to any Portacath or PICC line.   We strive to give you quality time with your provider. You may need to reschedule your appointment if you arrive late (15 or more minutes).  Arriving late affects you and other patients whose appointments are after yours.  Also, if you miss three or more appointments without notifying the office, you may be dismissed from the clinic at the provider's discretion.      For prescription refill requests, have your pharmacy contact our office and allow 72 hours for refills to be completed.    Today you received the following chemotherapy and/or immunotherapy agents Darzalex.    To help prevent nausea and vomiting after your treatment, we encourage you to take your nausea medication as directed.  BELOW ARE SYMPTOMS THAT SHOULD BE REPORTED IMMEDIATELY: *FEVER GREATER THAN 100.4 F (38 C) OR HIGHER *CHILLS OR SWEATING *NAUSEA AND VOMITING THAT IS NOT CONTROLLED WITH YOUR NAUSEA MEDICATION *UNUSUAL SHORTNESS OF BREATH *UNUSUAL BRUISING OR BLEEDING *URINARY PROBLEMS (pain or burning when urinating, or frequent urination) *BOWEL PROBLEMS (unusual diarrhea, constipation, pain near the anus) TENDERNESS IN MOUTH AND THROAT WITH OR WITHOUT PRESENCE OF ULCERS (sore throat, sores in mouth, or a toothache) UNUSUAL RASH, SWELLING OR PAIN  UNUSUAL VAGINAL DISCHARGE OR ITCHING   Items with * indicate a potential emergency and should be followed up as soon as possible or go to the Emergency Department if any problems should occur.  Please show the CHEMOTHERAPY ALERT CARD or IMMUNOTHERAPY ALERT CARD at check-in  to the Emergency Department and triage nurse.  Should you have questions after your visit or need to cancel or reschedule your appointment, please contact [facility]  Dept: [phone]  and follow the prompts.  Office hours are 8:00 a.m. to 4:30 p.m. Monday - Friday. Please note that voicemails left after 4:00 p.m. may not be returned until the following business day.  We are closed weekends and major holidays. You have access to a nurse at all times for urgent questions. Please call the main number to the clinic Dept: [phone] and follow the prompts.   For any non-urgent questions, you may also contact your provider using MyChart. We now offer e-Visits for anyone 37 and older to request care online for non-urgent symptoms. For details visit [email or web address].   Also download the MyChart app! Go to the app store, search "MyChart", open the app, select [facility], and log in with your MyChart username and password.

## 2022-08-30 NOTE — Progress Notes (Signed)
Patient seen by MD today  Vitals are within treatment parameters.  Labs reviewed: and are within treatment parameters.  Per physician team, patient is ready for treatment and there are NO modifications to the treatment plan.

## 2022-09-01 ENCOUNTER — Other Ambulatory Visit: Payer: Self-pay

## 2022-09-03 ENCOUNTER — Ambulatory Visit
Admission: RE | Admit: 2022-09-03 | Discharge: 2022-09-03 | Disposition: A | Discharge status: Home / Self Care | Payer: 59 | Source: Ambulatory Visit | Attending: Family Medicine | Admitting: Family Medicine

## 2022-09-03 DIAGNOSIS — I714 Abdominal aortic aneurysm, without rupture, unspecified: Secondary | ICD-10-CM

## 2022-09-05 ENCOUNTER — Other Ambulatory Visit: Payer: Self-pay

## 2022-09-06 ENCOUNTER — Encounter: Payer: Self-pay | Admitting: Hematology
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
## 2022-09-23 ENCOUNTER — Other Ambulatory Visit (HOSPITAL_COMMUNITY): Payer: Self-pay

## 2022-09-23 MED ORDER — OXYCODONE-ACETAMINOPHEN 10-325 MG PO TABS
1.0000 | ORAL_TABLET | Freq: Two times a day (BID) | ORAL | 0 refills | Status: DC
  Filled 2022-09-23 – 2022-09-26 (×2): qty 60, 30d supply, fill #0

## 2022-09-24 ENCOUNTER — Other Ambulatory Visit (HOSPITAL_COMMUNITY): Payer: Self-pay

## 2022-09-26 ENCOUNTER — Other Ambulatory Visit (HOSPITAL_COMMUNITY): Payer: Self-pay

## 2022-09-27 ENCOUNTER — Inpatient Hospital Stay: Payer: 59 | Attending: Oncology

## 2022-09-27 ENCOUNTER — Inpatient Hospital Stay: Payer: 59

## 2022-09-27 ENCOUNTER — Other Ambulatory Visit: Payer: Self-pay

## 2022-09-27 VITALS — BP 201/95 | HR 64 | Temp 98.0°F | Resp 18 | Wt 150.5 lb

## 2022-09-27 DIAGNOSIS — C9001 Multiple myeloma in remission: Secondary | ICD-10-CM

## 2022-09-27 DIAGNOSIS — Z79899 Other long term (current) drug therapy: Secondary | ICD-10-CM | POA: Diagnosis not present

## 2022-09-27 DIAGNOSIS — C9002 Multiple myeloma in relapse: Secondary | ICD-10-CM | POA: Diagnosis not present

## 2022-09-27 DIAGNOSIS — Z5112 Encounter for antineoplastic immunotherapy: Secondary | ICD-10-CM | POA: Diagnosis not present

## 2022-09-27 DIAGNOSIS — Z8042 Family history of malignant neoplasm of prostate: Secondary | ICD-10-CM | POA: Insufficient documentation

## 2022-09-27 DIAGNOSIS — D649 Anemia, unspecified: Secondary | ICD-10-CM | POA: Insufficient documentation

## 2022-09-27 DIAGNOSIS — Z95828 Presence of other vascular implants and grafts: Secondary | ICD-10-CM

## 2022-09-27 DIAGNOSIS — Z87891 Personal history of nicotine dependence: Secondary | ICD-10-CM | POA: Diagnosis not present

## 2022-09-27 DIAGNOSIS — Z8249 Family history of ischemic heart disease and other diseases of the circulatory system: Secondary | ICD-10-CM | POA: Insufficient documentation

## 2022-09-27 LAB — CMP (CANCER CENTER ONLY)
ALT: 18 U/L (ref 0–44)
AST: 25 U/L (ref 15–41)
Albumin: 4.1 g/dL (ref 3.5–5.0)
Alkaline Phosphatase: 81 U/L (ref 38–126)
Anion gap: 5 (ref 5–15)
BUN: 12 mg/dL (ref 8–23)
CO2: 28 mmol/L (ref 22–32)
Calcium: 8.9 mg/dL (ref 8.9–10.3)
Chloride: 103 mmol/L (ref 98–111)
Creatinine: 0.78 mg/dL (ref 0.61–1.24)
GFR, Estimated: >60 mL/min (ref >60)
Glucose, Bld: 96 mg/dL (ref 70–99)
Potassium: 4.3 mmol/L (ref 3.5–5.1)
Sodium: 136 mmol/L (ref 135–145)
Total Bilirubin: 0.3 mg/dL (ref 0.3–1.2)
Total Protein: 7.4 g/dL (ref 6.5–8.1)

## 2022-09-27 LAB — CBC WITH DIFFERENTIAL (CANCER CENTER ONLY)
Abs Immature Granulocytes: 0 10*3/uL (ref 0.00–0.07)
Basophils Absolute: 0 10*3/uL (ref 0.0–0.1)
Basophils Relative: 1 %
Eosinophils Absolute: 0 10*3/uL (ref 0.0–0.5)
Eosinophils Relative: 1 %
HCT: 35.7 % — ABNORMAL LOW (ref 39.0–52.0)
Hemoglobin: 12.1 g/dL — ABNORMAL LOW (ref 13.0–17.0)
Immature Granulocytes: 0 %
Lymphocytes Relative: 35 %
Lymphs Abs: 2.1 10*3/uL (ref 0.7–4.0)
MCH: 32.4 pg (ref 26.0–34.0)
MCHC: 33.9 g/dL (ref 30.0–36.0)
MCV: 95.5 fL (ref 80.0–100.0)
Monocytes Absolute: 0.4 10*3/uL (ref 0.1–1.0)
Monocytes Relative: 7 %
Neutro Abs: 3.3 10*3/uL (ref 1.7–7.7)
Neutrophils Relative %: 56 %
Platelet Count: 181 10*3/uL (ref 150–400)
RBC: 3.74 MIL/uL — ABNORMAL LOW (ref 4.22–5.81)
RDW: 12.7 % (ref 11.5–15.5)
WBC Count: 5.9 10*3/uL (ref 4.0–10.5)
nRBC: 0 % (ref 0.0–0.2)

## 2022-09-27 MED ORDER — CLONIDINE HCL 0.1 MG PO TABS
0.1000 mg | ORAL_TABLET | Freq: Once | ORAL | Status: AC
  Administered 2022-09-27: 0.1 mg via ORAL
  Filled 2022-09-27: qty 1

## 2022-09-27 MED ORDER — DEXAMETHASONE 4 MG PO TABS
8.0000 mg | ORAL_TABLET | Freq: Once | ORAL | Status: AC
  Administered 2022-09-27: 8 mg via ORAL
  Filled 2022-09-27: qty 2

## 2022-09-27 MED ORDER — DIPHENHYDRAMINE HCL 25 MG PO CAPS
50.0000 mg | ORAL_CAPSULE | Freq: Once | ORAL | Status: AC
  Administered 2022-09-27: 50 mg via ORAL
  Filled 2022-09-27: qty 2

## 2022-09-27 MED ORDER — DARATUMUMAB-HYALURONIDASE-FIHJ 1800-30000 MG-UT/15ML ~~LOC~~ SOLN
1800.0000 mg | Freq: Once | SUBCUTANEOUS | Status: AC
  Administered 2022-09-27: 1800 mg via SUBCUTANEOUS
  Filled 2022-09-27: qty 15

## 2022-09-27 MED ORDER — HEPARIN SOD (PORK) LOCK FLUSH 100 UNIT/ML IV SOLN
500.0000 [IU] | Freq: Once | INTRAVENOUS | Status: AC | PRN
  Administered 2022-09-27: 500 [IU]

## 2022-09-27 MED ORDER — SODIUM CHLORIDE 0.9% FLUSH
10.0000 mL | INTRAVENOUS | Status: DC | PRN
  Administered 2022-09-27: 10 mL

## 2022-09-27 MED ORDER — ACETAMINOPHEN 325 MG PO TABS
650.0000 mg | ORAL_TABLET | Freq: Once | ORAL | Status: AC
  Administered 2022-09-27: 650 mg via ORAL
  Filled 2022-09-27: qty 2

## 2022-09-27 NOTE — Patient Instructions (Signed)
[facility]  Discharge Instructions: Thank you for choosing [facility] to provide your oncology and hematology care.   If you have a lab appointment with the Cancer Center, please go directly to the Cancer Center and check in at the registration area.   Wear comfortable clothing and clothing appropriate for easy access to any Portacath or PICC line.   We strive to give you quality time with your provider. You may need to reschedule your appointment if you arrive late (15 or more minutes).  Arriving late affects you and other patients whose appointments are after yours.  Also, if you miss three or more appointments without notifying the office, you may be dismissed from the clinic at the provider's discretion.      For prescription refill requests, have your pharmacy contact our office and allow 72 hours for refills to be completed.    Today you received the following chemotherapy and/or immunotherapy agents darzalex faspro      To help prevent nausea and vomiting after your treatment, we encourage you to take your nausea medication as directed.  BELOW ARE SYMPTOMS THAT SHOULD BE REPORTED IMMEDIATELY: *FEVER GREATER THAN 100.4 F (38 C) OR HIGHER *CHILLS OR SWEATING *NAUSEA AND VOMITING THAT IS NOT CONTROLLED WITH YOUR NAUSEA MEDICATION *UNUSUAL SHORTNESS OF BREATH *UNUSUAL BRUISING OR BLEEDING *URINARY PROBLEMS (pain or burning when urinating, or frequent urination) *BOWEL PROBLEMS (unusual diarrhea, constipation, pain near the anus) TENDERNESS IN MOUTH AND THROAT WITH OR WITHOUT PRESENCE OF ULCERS (sore throat, sores in mouth, or a toothache) UNUSUAL RASH, SWELLING OR PAIN  UNUSUAL VAGINAL DISCHARGE OR ITCHING   Items with * indicate a potential emergency and should be followed up as soon as possible or go to the Emergency Department if any problems should occur.  Please show the CHEMOTHERAPY ALERT CARD or IMMUNOTHERAPY ALERT CARD at  check-in to the Emergency Department and triage nurse.  Should you have questions after your visit or need to cancel or reschedule your appointment, please contact [facility]  Dept: [phone]  and follow the prompts.  Office hours are 8:00 a.m. to 4:30 p.m. Monday - Friday. Please note that voicemails left after 4:00 p.m. may not be returned until the following business day.  We are closed weekends and major holidays. You have access to a nurse at all times for urgent questions. Please call the main number to the clinic Dept: [phone] and follow the prompts.   For any non-urgent questions, you may also contact your provider using MyChart. We now offer e-Visits for anyone 18 and older to request care online for non-urgent symptoms. For details visit [email or web address].   Also download the MyChart app! Go to the app store, search "MyChart", open the app, select , and log in with your MyChart username and password.  

## 2022-09-27 NOTE — Progress Notes (Signed)
Per Dr. Irene Limbo, [place] to treat with elevated BP

## 2022-09-28 ENCOUNTER — Emergency Department (HOSPITAL_COMMUNITY): Payer: 59

## 2022-09-28 ENCOUNTER — Emergency Department (HOSPITAL_COMMUNITY)
Admission: EM | Admit: 2022-09-28 | Discharge: 2022-09-28 | Disposition: A | Discharge status: Home / Self Care | Payer: 59 | Attending: Emergency Medicine | Admitting: Emergency Medicine

## 2022-09-28 ENCOUNTER — Other Ambulatory Visit: Payer: Self-pay

## 2022-09-28 ENCOUNTER — Encounter (HOSPITAL_COMMUNITY): Payer: Self-pay

## 2022-09-28 DIAGNOSIS — L03213 Periorbital cellulitis: Secondary | ICD-10-CM | POA: Diagnosis not present

## 2022-09-28 DIAGNOSIS — R519 Headache, unspecified: Secondary | ICD-10-CM | POA: Diagnosis not present

## 2022-09-28 DIAGNOSIS — Z79899 Other long term (current) drug therapy: Secondary | ICD-10-CM | POA: Insufficient documentation

## 2022-09-28 DIAGNOSIS — Z8546 Personal history of malignant neoplasm of prostate: Secondary | ICD-10-CM | POA: Diagnosis not present

## 2022-09-28 DIAGNOSIS — H0289 Other specified disorders of eyelid: Secondary | ICD-10-CM | POA: Diagnosis present

## 2022-09-28 DIAGNOSIS — H1032 Unspecified acute conjunctivitis, left eye: Secondary | ICD-10-CM | POA: Diagnosis not present

## 2022-09-28 DIAGNOSIS — I10 Essential (primary) hypertension: Secondary | ICD-10-CM | POA: Diagnosis not present

## 2022-09-28 DIAGNOSIS — Z8579 Personal history of other malignant neoplasms of lymphoid, hematopoietic and related tissues: Secondary | ICD-10-CM | POA: Diagnosis not present

## 2022-09-28 DIAGNOSIS — H1033 Unspecified acute conjunctivitis, bilateral: Secondary | ICD-10-CM | POA: Diagnosis not present

## 2022-09-28 LAB — CBC WITH DIFFERENTIAL/PLATELET
Abs Immature Granulocytes: 0.01 10*3/uL (ref 0.00–0.07)
Basophils Absolute: 0 10*3/uL (ref 0.0–0.1)
Basophils Relative: 0 %
Eosinophils Absolute: 0 10*3/uL (ref 0.0–0.5)
Eosinophils Relative: 0 %
HCT: 37.6 % — ABNORMAL LOW (ref 39.0–52.0)
Hemoglobin: 12.3 g/dL — ABNORMAL LOW (ref 13.0–17.0)
Immature Granulocytes: 0 %
Lymphocytes Relative: 29 %
Lymphs Abs: 1.9 10*3/uL (ref 0.7–4.0)
MCH: 31.9 pg (ref 26.0–34.0)
MCHC: 32.7 g/dL (ref 30.0–36.0)
MCV: 97.4 fL (ref 80.0–100.0)
Monocytes Absolute: 0.7 10*3/uL (ref 0.1–1.0)
Monocytes Relative: 10 %
Neutro Abs: 4 10*3/uL (ref 1.7–7.7)
Neutrophils Relative %: 61 %
Platelets: 191 10*3/uL (ref 150–400)
RBC: 3.86 MIL/uL — ABNORMAL LOW (ref 4.22–5.81)
RDW: 12.7 % (ref 11.5–15.5)
WBC: 6.6 10*3/uL (ref 4.0–10.5)
nRBC: 0 % (ref 0.0–0.2)

## 2022-09-28 LAB — BASIC METABOLIC PANEL
Anion gap: 10 (ref 5–15)
BUN: 11 mg/dL (ref 8–23)
CO2: 24 mmol/L (ref 22–32)
Calcium: 9.2 mg/dL (ref 8.9–10.3)
Chloride: 101 mmol/L (ref 98–111)
Creatinine, Ser: 0.97 mg/dL (ref 0.61–1.24)
GFR, Estimated: >60 mL/min (ref >60)
Glucose, Bld: 80 mg/dL (ref 70–99)
Potassium: 3.6 mmol/L (ref 3.5–5.1)
Sodium: 135 mmol/L (ref 135–145)

## 2022-09-28 MED ORDER — VANCOMYCIN HCL 1500 MG/300ML IV SOLN
1500.0000 mg | Freq: Once | INTRAVENOUS | Status: AC
  Administered 2022-09-28: 1500 mg via INTRAVENOUS
  Filled 2022-09-28: qty 300

## 2022-09-28 MED ORDER — LACTATED RINGERS IV BOLUS
1000.0000 mL | Freq: Once | INTRAVENOUS | Status: AC
  Administered 2022-09-28: 1000 mL via INTRAVENOUS

## 2022-09-28 MED ORDER — VANCOMYCIN HCL 1750 MG/350ML IV SOLN: 1750.0000 mg | INTRAVENOUS | Status: DC

## 2022-09-28 MED ORDER — CLINDAMYCIN HCL 150 MG PO CAPS: 300.0000 mg | ORAL_CAPSULE | Freq: Three times a day (TID) | ORAL | 0 refills | Status: DC

## 2022-09-28 MED ORDER — SODIUM CHLORIDE 0.9 % IV SOLN
3.0000 g | Freq: Once | INTRAVENOUS | Status: AC
  Administered 2022-09-28: 3 g via INTRAVENOUS
  Filled 2022-09-28: qty 8

## 2022-09-28 MED ORDER — AMOXICILLIN-POT CLAVULANATE 875-125 MG PO TABS: 1.0000 | ORAL_TABLET | Freq: Two times a day (BID) | ORAL | 0 refills | Status: DC

## 2022-09-28 MED ORDER — CLINDAMYCIN HCL 150 MG PO CAPS: 300.0000 mg | ORAL_CAPSULE | Freq: Three times a day (TID) | ORAL | 0 refills | Status: AC

## 2022-09-28 MED ORDER — AMOXICILLIN-POT CLAVULANATE 875-125 MG PO TABS
1.0000 | ORAL_TABLET | Freq: Two times a day (BID) | ORAL | 0 refills | Status: DC
  Filled 2022-09-28: qty 14, 7d supply, fill #0

## 2022-09-28 MED ORDER — IOHEXOL 350 MG/ML SOLN: 75.0000 mL | Freq: Once | INTRAVENOUS | Status: DC | PRN

## 2022-09-28 MED ORDER — IOPAMIDOL (ISOVUE-370) INJECTION 76%
75.0000 mL | Freq: Once | INTRAVENOUS | Status: AC | PRN
  Administered 2022-09-28: 75 mL via INTRAVENOUS

## 2022-09-28 MED ORDER — CLINDAMYCIN HCL 150 MG PO CAPS
300.0000 mg | ORAL_CAPSULE | Freq: Three times a day (TID) | ORAL | 0 refills | Status: DC
  Filled 2022-09-28: qty 42, 7d supply, fill #0

## 2022-09-28 MED ORDER — AMOXICILLIN-POT CLAVULANATE 875-125 MG PO TABS: 1.0000 | ORAL_TABLET | Freq: Two times a day (BID) | ORAL | 0 refills | Status: AC

## 2022-09-28 MED ORDER — POLYMYXIN B-TRIMETHOPRIM 10000-0.1 UNIT/ML-% OP SOLN: 1.0000 [drp] | OPHTHALMIC | 0 refills | Status: DC

## 2022-09-28 MED ORDER — FLUORESCEIN SODIUM 1 MG OP STRP
1.0000 | ORAL_STRIP | Freq: Once | OPHTHALMIC | Status: AC
  Administered 2022-09-28: 1 via OPHTHALMIC
  Filled 2022-09-28: qty 1

## 2022-09-28 MED ORDER — POLYMYXIN B-TRIMETHOPRIM 10000-0.1 UNIT/ML-% OP SOLN
1.0000 [drp] | OPHTHALMIC | 0 refills | Status: DC
  Filled 2022-09-28: qty 10, 34d supply, fill #0

## 2022-09-28 NOTE — ED Triage Notes (Signed)
The pt has  a painful lr eye that is red and irritated appearing that started yesterday  he has bone cancer he is is seen at [facility] long for the same

## 2022-09-28 NOTE — ED Provider Notes (Signed)
[facility] Provider Note   CSN: [id number] Arrival date & time: 09/28/22  1614     History  Chief Complaint  Patient presents with   Eye Problem    Travis Nguyen is a 72 y.o. male.   Eye Problem Associated symptoms: discharge      72 year old male with medical history significant for HTN, prostate cancer, multiple myeloma who presents to the emergency department with left eyelid swelling and conjunctivitis.  The patient states that his symptoms came on yesterday starting in the left eye.  He developed left eyelid swelling and surrounding swelling of his eye.  Denies any pain with extraocular movements, no vision loss.  He states that he has had purulent discharge from the left eye and that is now spread to the right eye.  He endorses redness and swelling in the left eye.  He denies any falls or trauma to the eye.  No fevers or chills.  Home Medications Prior to Admission medications   Medication Sig Start Date End Date Taking? Authorizing Provider  amoxicillin-clavulanate (AUGMENTIN) 875-125 MG tablet Take 1 tablet by mouth every 12 (twelve) hours for 7 days. 09/28/22 10/05/22 Yes Regan Lemming, MD  clindamycin (CLEOCIN) 150 MG capsule Take 2 capsules (300 mg total) by mouth 3 (three) times daily for 7 days. 09/28/22 10/05/22 Yes Regan Lemming, MD  trimethoprim-polymyxin b (POLYTRIM) ophthalmic solution Place 1 drop into both eyes every 4 (four) hours. 09/28/22  Yes Regan Lemming, MD  acyclovir (ZOVIRAX) 400 MG tablet Take 1 tablet (400 mg total) by mouth 2 (two) times daily. 07/01/22   Wyatt Portela, MD  amLODipine (NORVASC) 10 MG tablet Take 1 tablet by mouth once a day Patient not taking: Reported on 08/30/2022 04/24/21     amLODipine (NORVASC) 5 MG tablet Take 1 tablet (5 mg total) by mouth daily. Patient not taking: Reported on 08/30/2022 02/25/22     amLODipine (NORVASC) 5 MG tablet Take 1 tablet (5 mg total) by mouth daily.  08/12/22     cyclobenzaprine (FLEXERIL) 5 MG tablet Take 1 - 2 tablets by mouth at bedtime as needed Patient not taking: Reported on 08/30/2022 05/20/22     escitalopram (LEXAPRO) 10 MG tablet Take 1 tablet by mouth once a day Patient not taking: Reported on 08/30/2022 04/24/21     escitalopram (LEXAPRO) 10 MG tablet Take 1 tablet by mouth once daily Patient not taking: Reported on 08/30/2022 07/24/21     escitalopram (LEXAPRO) 10 MG tablet Take 1 tablet (10 mg total) by mouth daily. Patient not taking: Reported on 08/30/2022 02/25/22     escitalopram (LEXAPRO) 10 MG tablet Take 1 tablet (10 mg total) by mouth daily. 08/12/22     lidocaine-prilocaine (EMLA) cream APPLY TOPICALLY TO PORT-A-CATH DAILY AS NEEDED 06/28/22 06/28/23  Wyatt Portela, MD  lidocaine-prilocaine (EMLA) cream Apply 1 Application topically once. Prior to port access Patient not taking: Reported on 08/30/2022    [provider]  losartan (COZAAR) 100 MG tablet TAKE 1 TABLET BY MOUTH ONCE A DAY 06/06/20 06/06/21  [name], MD  losartan (COZAAR) 100 MG tablet Take 1 tablet by mouth once a day Patient not taking: Reported on 08/30/2022 04/24/21     losartan (COZAAR) 100 MG tablet Take 1 tablet by mouth daily Patient not taking: Reported on 08/30/2022 07/24/21     losartan (COZAAR) 100 MG tablet Take 1 tablet (100 mg total) by mouth daily. 02/25/22  losartan (COZAAR) 100 MG tablet Take 1 tablet (100 mg total) by mouth daily. Patient not taking: Reported on 08/30/2022 08/12/22     oxyCODONE-acetaminophen (PERCOCET) 10-325 MG tablet Take 1 tablet by mouth 2 times a day Patient not taking: Reported on 08/30/2022 12/15/20     oxyCODONE-acetaminophen (PERCOCET) 10-325 MG tablet Take 1 tablet by mouth twice daily as needed (07/05/21) Patient not taking: Reported on 08/30/2022 07/02/21     oxyCODONE-acetaminophen (PERCOCET) 10-325 MG tablet Take 1 tablet by mouth 2 (two) times daily as needed. Patient not taking: Reported on  08/30/2022 08/03/21     oxyCODONE-acetaminophen (PERCOCET) 10-325 MG tablet Take 1 tablet by mouth 2 times a day as needed orally (ok to fill 30 days after last refill) Patient not taking: Reported on 08/30/2022 02/25/22     oxyCODONE-acetaminophen (PERCOCET) 10-325 MG tablet Take 1 tablet by mouth 2 (two) times daily as needed. Patient not taking: Reported on 08/30/2022 04/29/22     oxyCODONE-acetaminophen (PERCOCET) 10-325 MG tablet Take 1 tablet by mouth 2 (two) times daily. okay to fill 30 days after last refill Patient not taking: Reported on 08/30/2022 05/22/22     oxyCODONE-acetaminophen (PERCOCET) 10-325 MG tablet Take 1 tablet by mouth 2 (two) times daily as needed. (ok to fill 30 days after last refill) Patient not taking: Reported on 08/30/2022 06/27/22     oxyCODONE-acetaminophen (PERCOCET) 10-325 MG tablet Take 1 tablet by mouth 2 (two) times daily as needed 09/23/22     pantoprazole (PROTONIX) 20 MG tablet Take 1 tablet (20 mg total) by mouth daily. Patient not taking: Reported on 08/30/2022 01/07/21   Quintella Reichert, MD  vitamin C (ASCORBIC ACID) 500 MG tablet Take 500 mg by mouth daily.    [provider]      Allergies    Aspirin    Review of Systems   Review of Systems  Eyes:  Positive for discharge.  All other systems reviewed and are negative.   Physical Exam Updated Vital Signs BP (!) 173/98   Pulse (!) 54   Temp 98.6 F (37 C) (Oral)   Resp 18   Ht 6\' 1"  (1.854 m)   Wt 68.3 kg   SpO2 99%   BMI 19.87 kg/m  Physical Exam Vitals and nursing note reviewed.  Constitutional:      General: He is not in acute distress.    Appearance: He is well-developed.  HENT:     Head: Normocephalic and atraumatic.  Eyes:     General: Vision grossly intact. Gaze aligned appropriately.        Right eye: Discharge present.        Left eye: Discharge present.    Extraocular Movements: Extraocular movements intact.     Conjunctiva/sclera:     Right eye: Right conjunctiva  is injected.     Left eye: Left conjunctiva is injected. Chemosis present.     Comments: Left eye with significant chemosis, extraocular movements intact without pain, evidence of conjunctival injection, purulent discharge present, negative Seidel sign, vision grossly intact, surrounding eyelid edema and mild cellulitis  Cardiovascular:     Rate and Rhythm: Normal rate and regular rhythm.     Heart sounds: No murmur heard. Pulmonary:     Effort: Pulmonary effort is normal. No respiratory distress.     Breath sounds: Normal breath sounds.  Abdominal:     Palpations: Abdomen is soft.     Tenderness: There is no abdominal tenderness.  Musculoskeletal:  General: No swelling.     Cervical back: Neck supple.  Skin:    General: Skin is warm and dry.     Capillary Refill: Capillary refill takes less than 2 seconds.  Neurological:     Mental Status: He is alert.  Psychiatric:        Mood and Affect: Mood normal.     ED Results / Procedures / Treatments   Labs (all labs ordered are listed, but only abnormal results are displayed) Labs Reviewed  CBC WITH DIFFERENTIAL/PLATELET - Abnormal; Notable for the following components:      Result Value   RBC 3.86 (*)    Hemoglobin 12.3 (*)    HCT 37.6 (*)    All other components within normal limits  CULTURE, BLOOD (ROUTINE X 2)  CULTURE, BLOOD (ROUTINE X 2)  BASIC METABOLIC PANEL    EKG None  Radiology CT Orbits W Contrast  Result Date: 09/28/2022 CLINICAL DATA:  Left orbital pain EXAM: CT ORBITS WITH CONTRAST TECHNIQUE: Multidetector CT images was performed according to the standard protocol following intravenous contrast administration. RADIATION DOSE REDUCTION: This exam was performed according to the departmental dose-optimization program which includes automated exposure control, adjustment of the mA and/or kV according to patient size and/or use of iterative reconstruction technique. CONTRAST:  108mL ISOVUE-370 IOPAMIDOL  (ISOVUE-370) INJECTION 76% COMPARISON:  None Available. FINDINGS: Orbits: No orbital mass or evidence of inflammation. Normal appearance of the globes, optic nerve-sheath complexes, extraocular muscles, orbital fat and lacrimal glands. Visible paranasal sinuses: There is partial opacification of both maxillary sinuses with moderate opacification of the left ethmoid air cells. Soft tissues: Mild left periorbital soft tissue swelling. Osseous: No fracture or aggressive lesion. Limited intracranial: No acute or significant finding. IMPRESSION: 1. Mild left periorbital cellulitis without orbital (postseptal) inflammation. 2. Partial opacification of both maxillary sinuses and moderate opacification of the left ethmoid air cells. Electronically Signed   By: Ulyses Jarred M.D.   On: 09/28/2022 21:06    Procedures Procedures    Medications Ordered in ED Medications  iohexol (OMNIPAQUE) 350 MG/ML injection 75 mL ( Intravenous Canceled Entry 09/28/22 2047)  vancomycin (VANCOREADY) IVPB 1750 mg/350 mL (has no administration in time range)  fluorescein ophthalmic strip 1 strip (1 strip Left Eye Given 09/28/22 1846)  Ampicillin-Sulbactam (UNASYN) 3 g in sodium chloride 0.9 % 100 mL IVPB (0 g Intravenous Stopped 09/28/22 1921)  lactated ringers bolus 1,000 mL (0 mLs Intravenous Stopped 09/28/22 2214)  vancomycin (VANCOREADY) IVPB 1500 mg/300 mL (0 mg Intravenous Stopped 09/28/22 2214)  iopamidol (ISOVUE-370) 76 % injection 75 mL (75 mLs Intravenous Contrast Given 09/28/22 1819)    ED Course/ Medical Decision Making/ A&P                             Medical Decision Making Amount and/or Complexity of Data Reviewed Labs: ordered. Radiology: ordered.  Risk Prescription drug management.     72 year old male with medical history significant for HTN, prostate cancer, multiple myeloma who presents to the emergency department with left eyelid swelling and conjunctivitis.  The patient states that his symptoms came  on yesterday starting in the left eye.  He developed left eyelid swelling and surrounding swelling of his eye.  Denies any pain with extraocular movements, no vision loss.  He states that he has had purulent discharge from the left eye and that is now spread to the right eye.  He endorses redness and swelling  in the left eye.  He denies any falls or trauma to the eye.  No fevers or chills.  On arrival, the patient was vitally stable, afebrile, not tachycardic or tachypneic, sinus rhythm noted on cardiac telemetry, initially hypertensive 201/99, subsequently improved to 169/79 without significant intervention.  Concern for periorbital cellulitis, conjunctivitis, developing orbital cellulitis.  IV access was obtained and the patient was administered IV vancomycin and IV Unasyn.  On exam, Left eye with significant chemosis, extraocular movements intact without pain, evidence of conjunctival injection, purulent discharge present, negative Seidel sign, vision grossly intact, surrounding eyelid edema and mild cellulitis.  Visual Acuity Bilateral Distance: 20/25 R Distance: 20/25 L Distance: 20/32  CT of the orbit was obtained: IMPRESSION:  1. Mild left periorbital cellulitis without orbital (postseptal)  inflammation.  2. Partial opacification of both maxillary sinuses and moderate  opacification of the left ethmoid air cells.    Laboratory evaluation significant for CBC without a leukocytosis, mild anemia to 12.3, BMP unremarkable, blood cultures collected and pending.   Given the findings of periorbital cellulitis, bilateral conjunctivitis with worsening chemosis on the left, will treat with oral and topical antibiotics and have the patient follow-up closely with ophthalmology outpatient.   Final Clinical Impression(s) / ED Diagnoses Final diagnoses:  Periorbital cellulitis of left eye  Acute bacterial conjunctivitis of both eyes    Rx / DC Orders ED Discharge Orders          Ordered     Ambulatory referral to Ophthalmology        09/28/22 2211    clindamycin (CLEOCIN) 150 MG capsule  3 times daily        09/28/22 2223    amoxicillin-clavulanate (AUGMENTIN) 875-125 MG tablet  Every 12 hours        09/28/22 2223    trimethoprim-polymyxin b (POLYTRIM) ophthalmic solution  Every 4 hours        09/28/22 2223              Regan Lemming, MD 09/28/22 2223

## 2022-09-28 NOTE — Progress Notes (Signed)
Pharmacy Antibiotic Note  Travis Nguyen is a 72 y.o. male for which pharmacy has been consulted for vancomycin dosing for cellulitis.  Patient with a history of anemia, arthritis, HTN, multiple myeloma, prostate cancer. Patient presenting with eye problem.  SCr 0.97 WBC 6.6; T 98.9; HR 54; RR 18  Plan: Unasyn per MD Vancomycin 1500 mg given once in the ED ---Plan for 1750 mg q24hr (eAUC 495.5) unless change in renal function Trend WBC, Fever, Renal function F/u cultures, clinical course, WBC De-escalate when able  Height: 6\' 1"  (185.4 cm) Weight: 68.3 kg (150 lb 9.2 oz) IBW/kg (Calculated) : 79.9  Temp (24hrs), Avg:98.9 F (37.2 C), Min:98.9 F (37.2 C), Max:98.9 F (37.2 C)  Recent Labs  Lab 09/27/22 1208  WBC 5.9  CREATININE 0.78    Estimated Creatinine Clearance: 84.2 mL/min (by C-G formula based on SCr of 0.78 mg/dL).    Allergies  Allergen Reactions   Aspirin     Pt stated "raises BP"    Microbiology results: Pending  Thank you for allowing pharmacy to be a part of this patient's care.  Lorelei Pont, PharmD, BCPS 09/28/2022 6:18 PM ED Clinical Pharmacist -  [phone]

## 2022-09-30 ENCOUNTER — Other Ambulatory Visit (HOSPITAL_COMMUNITY): Payer: Self-pay

## 2022-10-03 LAB — CULTURE, BLOOD (ROUTINE X 2)
Culture: NO GROWTH
Culture: NO GROWTH
Special Requests: ADEQUATE

## 2022-10-08 ENCOUNTER — Telehealth: Payer: Self-pay | Admitting: *Deleted

## 2022-10-08 NOTE — Progress Notes (Signed)
  Care Coordination   Note   10/08/2022 Name: KEVEN PYLES MRN: [id number] DOB: January 10, 1951  MAIKEL GREIMAN is a 72 y.o. year old male who sees [name], MD for primary care. I reached out to [name] by phone today to offer care coordination services.  Mr. Wehrer was given information about Care Coordination services today including:   The Care Coordination services include support from the care team which includes your Nurse Coordinator, Clinical Social Worker, or Pharmacist.  The Care Coordination team is here to help remove barriers to the health concerns and goals most important to you. Care Coordination services are voluntary, and the patient may decline or stop services at any time by request to their care team member.   Care Coordination Consent Status: Patient agreed to services and verbal consent obtained.   Follow up plan:  Telephone appointment with care coordination team member scheduled for:  10/09/2022  Encounter Outcome:  Pt. Scheduled  Julian Hy, [place] Direct Dial: [phone]

## 2022-10-09 ENCOUNTER — Ambulatory Visit: Payer: Self-pay

## 2022-10-09 NOTE — Patient Outreach (Signed)
  Care Coordination   Initial Visit Note   10/09/2022 Name: Travis Nguyen MRN: [id number] DOB: 1950/10/28  Travis Nguyen is a 72 y.o. year old male who sees [name], MD for primary care. I spoke with  Travis Nguyen by phone today.  What matters to the patients health and wellness today?  I spoke with Mr. Laconte and he does not have any need for my assistance at this time.  He is working with his physician for his care.    Goals Addressed             This Visit's Progress    COMPLETED: Care Coordination Activites -No follow up rrequired        Care Coordination Interventions: Active listening / Reflection utilized  Emotional Support Provided Problem [place] strategies reviewed Discussed/.Educated Care Coordination Program 2.   Discussed/.Educated Annual Wellness Visit 3.   Discussed/.Educated Social Determinates of Health 4.   Please inform PCP if services needed in the future         SDOH assessments and interventions completed:  Yes  SDOH Interventions Today    Flowsheet Row Most Recent Value  SDOH Interventions   Food Insecurity Interventions Intervention Not Indicated  Transportation Interventions Intervention Not Indicated        Care Coordination Interventions:  Yes, provided    Interventions Today    Flowsheet Row Most Recent Value  General Interventions   General Interventions Discussed/Reviewed General Interventions Discussed  [Reviewed program information]      Follow up plan: No further intervention required.    Encounter Outcome:  Pt. Visit Completed   Lazaro Arms RN, BSN, [facility]   Phone: [phone]

## 2022-10-09 NOTE — Patient Instructions (Signed)
Visit Information  Thank you for taking time to visit with me today. Please don't hesitate to contact me if I can be of assistance to you.   Following are the goals we discussed today:   Goals Addressed             This Visit's Progress    COMPLETED: Care Coordination Activites -No follow up rrequired        Care Coordination Interventions: Active listening / Reflection utilized  Emotional Support Provided Problem [place] strategies reviewed Discussed/.Educated Care Coordination Program 2.   Discussed/.Educated Annual Wellness Visit 3.   Discussed/.Educated Social Determinates of Health 4.   Please inform PCP if services needed in the future          If you are experiencing a Mental Health or [place] or need someone to talk to, please call 1-800-273-TALK (toll free, 24 hour hotline)  Patient verbalizes understanding of instructions and care plan provided today and agrees to view in [place]. Active MyChart status and patient understanding of how to access instructions and care plan via MyChart confirmed with patient.     Lazaro Arms RN, BSN, [facility]   Phone: [phone]

## 2022-10-17 DIAGNOSIS — Z86010 Personal history of colonic polyps: Secondary | ICD-10-CM | POA: Diagnosis not present

## 2022-10-22 ENCOUNTER — Other Ambulatory Visit: Payer: Self-pay

## 2022-10-22 ENCOUNTER — Other Ambulatory Visit (HOSPITAL_COMMUNITY): Payer: Self-pay

## 2022-10-22 MED ORDER — OXYCODONE-ACETAMINOPHEN 10-325 MG PO TABS
1.0000 | ORAL_TABLET | Freq: Two times a day (BID) | ORAL | 0 refills | Status: DC | PRN
  Filled 2022-10-24: qty 60, 30d supply, fill #0

## 2022-10-24 ENCOUNTER — Other Ambulatory Visit (HOSPITAL_COMMUNITY): Payer: Self-pay

## 2022-10-25 ENCOUNTER — Inpatient Hospital Stay: Payer: 59

## 2022-10-25 ENCOUNTER — Other Ambulatory Visit: Payer: Self-pay

## 2022-10-25 ENCOUNTER — Inpatient Hospital Stay: Payer: 59 | Attending: Oncology

## 2022-10-25 ENCOUNTER — Inpatient Hospital Stay (HOSPITAL_BASED_OUTPATIENT_CLINIC_OR_DEPARTMENT_OTHER): Payer: 59 | Admitting: Hematology

## 2022-10-25 VITALS — BP 178/97 | HR 72 | Temp 97.7°F | Resp 20 | Wt 155.0 lb

## 2022-10-25 DIAGNOSIS — Z95828 Presence of other vascular implants and grafts: Secondary | ICD-10-CM

## 2022-10-25 DIAGNOSIS — C9001 Multiple myeloma in remission: Secondary | ICD-10-CM

## 2022-10-25 DIAGNOSIS — Z5112 Encounter for antineoplastic immunotherapy: Secondary | ICD-10-CM

## 2022-10-25 DIAGNOSIS — Z9079 Acquired absence of other genital organ(s): Secondary | ICD-10-CM | POA: Diagnosis not present

## 2022-10-25 DIAGNOSIS — Z8042 Family history of malignant neoplasm of prostate: Secondary | ICD-10-CM | POA: Diagnosis not present

## 2022-10-25 DIAGNOSIS — M549 Dorsalgia, unspecified: Secondary | ICD-10-CM | POA: Diagnosis not present

## 2022-10-25 DIAGNOSIS — Z85820 Personal history of malignant melanoma of skin: Secondary | ICD-10-CM | POA: Diagnosis not present

## 2022-10-25 DIAGNOSIS — Z79624 Long term (current) use of inhibitors of nucleotide synthesis: Secondary | ICD-10-CM | POA: Insufficient documentation

## 2022-10-25 DIAGNOSIS — Z886 Allergy status to analgesic agent status: Secondary | ICD-10-CM | POA: Diagnosis not present

## 2022-10-25 DIAGNOSIS — C61 Malignant neoplasm of prostate: Secondary | ICD-10-CM | POA: Insufficient documentation

## 2022-10-25 DIAGNOSIS — Z87891 Personal history of nicotine dependence: Secondary | ICD-10-CM | POA: Insufficient documentation

## 2022-10-25 DIAGNOSIS — Z8616 Personal history of COVID-19: Secondary | ICD-10-CM | POA: Insufficient documentation

## 2022-10-25 DIAGNOSIS — C9002 Multiple myeloma in relapse: Secondary | ICD-10-CM | POA: Diagnosis not present

## 2022-10-25 DIAGNOSIS — Z7969 Long term (current) use of other immunomodulators and immunosuppressants: Secondary | ICD-10-CM | POA: Diagnosis not present

## 2022-10-25 DIAGNOSIS — Z9049 Acquired absence of other specified parts of digestive tract: Secondary | ICD-10-CM | POA: Diagnosis not present

## 2022-10-25 DIAGNOSIS — Z79899 Other long term (current) drug therapy: Secondary | ICD-10-CM | POA: Insufficient documentation

## 2022-10-25 DIAGNOSIS — Z79620 Long term (current) use of immunosuppressive biologic: Secondary | ICD-10-CM | POA: Diagnosis not present

## 2022-10-25 DIAGNOSIS — Z8249 Family history of ischemic heart disease and other diseases of the circulatory system: Secondary | ICD-10-CM | POA: Insufficient documentation

## 2022-10-25 DIAGNOSIS — M7989 Other specified soft tissue disorders: Secondary | ICD-10-CM | POA: Diagnosis not present

## 2022-10-25 LAB — CMP (CANCER CENTER ONLY)
ALT: 29 U/L (ref 0–44)
AST: 30 U/L (ref 15–41)
Albumin: 4.1 g/dL (ref 3.5–5.0)
Alkaline Phosphatase: 85 U/L (ref 38–126)
Anion gap: 3 — ABNORMAL LOW (ref 5–15)
BUN: 10 mg/dL (ref 8–23)
CO2: 29 mmol/L (ref 22–32)
Calcium: 9 mg/dL (ref 8.9–10.3)
Chloride: 103 mmol/L (ref 98–111)
Creatinine: 0.77 mg/dL (ref 0.61–1.24)
GFR, Estimated: >60 mL/min (ref >60)
Glucose, Bld: 83 mg/dL (ref 70–99)
Potassium: 3.9 mmol/L (ref 3.5–5.1)
Sodium: 135 mmol/L (ref 135–145)
Total Bilirubin: 0.4 mg/dL (ref 0.3–1.2)
Total Protein: 7.4 g/dL (ref 6.5–8.1)

## 2022-10-25 LAB — CBC WITH DIFFERENTIAL (CANCER CENTER ONLY)
Abs Immature Granulocytes: 0.01 10*3/uL (ref 0.00–0.07)
Basophils Absolute: 0 10*3/uL (ref 0.0–0.1)
Basophils Relative: 0 %
Eosinophils Absolute: 0 10*3/uL (ref 0.0–0.5)
Eosinophils Relative: 1 %
HCT: 34.1 % — ABNORMAL LOW (ref 39.0–52.0)
Hemoglobin: 11.6 g/dL — ABNORMAL LOW (ref 13.0–17.0)
Immature Granulocytes: 0 %
Lymphocytes Relative: 41 %
Lymphs Abs: 1.9 10*3/uL (ref 0.7–4.0)
MCH: 32.2 pg (ref 26.0–34.0)
MCHC: 34 g/dL (ref 30.0–36.0)
MCV: 94.7 fL (ref 80.0–100.0)
Monocytes Absolute: 0.3 10*3/uL (ref 0.1–1.0)
Monocytes Relative: 7 %
Neutro Abs: 2.3 10*3/uL (ref 1.7–7.7)
Neutrophils Relative %: 51 %
Platelet Count: 194 10*3/uL (ref 150–400)
RBC: 3.6 MIL/uL — ABNORMAL LOW (ref 4.22–5.81)
RDW: 12.8 % (ref 11.5–15.5)
WBC Count: 4.6 10*3/uL (ref 4.0–10.5)
nRBC: 0 % (ref 0.0–0.2)

## 2022-10-25 MED ORDER — SODIUM CHLORIDE 0.9% FLUSH
10.0000 mL | INTRAVENOUS | Status: DC | PRN
  Administered 2022-10-25: 10 mL

## 2022-10-25 MED ORDER — DIPHENHYDRAMINE HCL 25 MG PO CAPS
50.0000 mg | ORAL_CAPSULE | Freq: Once | ORAL | Status: AC
  Administered 2022-10-25: 50 mg via ORAL
  Filled 2022-10-25: qty 2

## 2022-10-25 MED ORDER — HEPARIN SOD (PORK) LOCK FLUSH 100 UNIT/ML IV SOLN
500.0000 [IU] | Freq: Once | INTRAVENOUS | Status: AC | PRN
  Administered 2022-10-25: 500 [IU]

## 2022-10-25 MED ORDER — ACETAMINOPHEN 325 MG PO TABS
650.0000 mg | ORAL_TABLET | Freq: Once | ORAL | Status: AC
  Administered 2022-10-25: 650 mg via ORAL
  Filled 2022-10-25: qty 2

## 2022-10-25 MED ORDER — DEXAMETHASONE 4 MG PO TABS
8.0000 mg | ORAL_TABLET | Freq: Once | ORAL | Status: AC
  Administered 2022-10-25: 8 mg via ORAL
  Filled 2022-10-25: qty 2

## 2022-10-25 MED ORDER — DARATUMUMAB-HYALURONIDASE-FIHJ 1800-30000 MG-UT/15ML ~~LOC~~ SOLN
1800.0000 mg | Freq: Once | SUBCUTANEOUS | Status: AC
  Administered 2022-10-25: 1800 mg via SUBCUTANEOUS
  Filled 2022-10-25: qty 15

## 2022-10-25 NOTE — Progress Notes (Signed)
Patient seen by Dr. Addison Naegeli are not all within treatment parameters. BP 178/97 Dr Candise Che aware  Labs reviewed: all are within Tx parameters  Per physician team, patient is ready for treatment and there are NO modifications to the treatment plan./ Pt needs to get Multiple myl. Panel and light chains drawn today prior to d/c
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
## 2022-10-26 ENCOUNTER — Other Ambulatory Visit (HOSPITAL_COMMUNITY): Payer: Self-pay

## 2022-10-26 MED ORDER — PEG 3350-KCL-NA BICARB-NACL 420 G PO SOLR
ORAL | 0 refills | Status: DC
  Filled 2022-10-26: qty 4000, 1d supply, fill #0

## 2022-10-26 MED ORDER — BISACODYL 5 MG PO TBEC
DELAYED_RELEASE_TABLET | ORAL | 0 refills | Status: DC
  Filled 2022-10-26: qty 4, 1d supply, fill #0

## 2022-10-27 ENCOUNTER — Other Ambulatory Visit: Payer: Self-pay

## 2022-10-28 LAB — KAPPA/LAMBDA LIGHT CHAINS
Kappa free light chain: 37.2 mg/L — ABNORMAL HIGH (ref 3.3–19.4)
Kappa, lambda light chain ratio: 10.63 — ABNORMAL HIGH (ref 0.26–1.65)
Lambda free light chains: 3.5 mg/L — ABNORMAL LOW (ref 5.7–26.3)

## 2022-10-30 ENCOUNTER — Other Ambulatory Visit: Payer: Self-pay

## 2022-10-30 LAB — MULTIPLE MYELOMA PANEL, SERUM
Albumin SerPl Elph-Mcnc: 3.7 g/dL (ref 2.9–4.4)
Albumin/Glob SerPl: 1.3 (ref 0.7–1.7)
Alpha 1: 0.2 g/dL (ref 0.0–0.4)
Alpha2 Glob SerPl Elph-Mcnc: 0.7 g/dL (ref 0.4–1.0)
B-Globulin SerPl Elph-Mcnc: 0.9 g/dL (ref 0.7–1.3)
Gamma Glob SerPl Elph-Mcnc: 1.3 g/dL (ref 0.4–1.8)
Globulin, Total: 3 g/dL (ref 2.2–3.9)
IgA: 27 mg/dL — ABNORMAL LOW (ref 61–437)
IgG (Immunoglobin G), Serum: 1477 mg/dL (ref 603–1613)
IgM (Immunoglobulin M), Srm: 27 mg/dL (ref 20–172)
M Protein SerPl Elph-Mcnc: 0.9 g/dL — ABNORMAL HIGH
Total Protein ELP: 6.7 g/dL (ref 6.0–8.5)

## 2022-10-31 DIAGNOSIS — Z86010 Personal history of colonic polyps: Secondary | ICD-10-CM | POA: Diagnosis not present

## 2022-10-31 DIAGNOSIS — Z09 Encounter for follow-up examination after completed treatment for conditions other than malignant neoplasm: Secondary | ICD-10-CM | POA: Diagnosis not present

## 2022-10-31 DIAGNOSIS — Q438 Other specified congenital malformations of intestine: Secondary | ICD-10-CM | POA: Diagnosis not present

## 2022-10-31 DIAGNOSIS — K648 Other hemorrhoids: Secondary | ICD-10-CM | POA: Diagnosis not present

## 2022-11-01 ENCOUNTER — Encounter: Payer: Self-pay | Admitting: Hematology

## 2022-11-06 ENCOUNTER — Other Ambulatory Visit: Payer: Self-pay

## 2022-11-19 ENCOUNTER — Other Ambulatory Visit: Payer: Self-pay

## 2022-11-19 ENCOUNTER — Other Ambulatory Visit (HOSPITAL_COMMUNITY): Payer: Self-pay

## 2022-11-19 MED ORDER — OXYCODONE-ACETAMINOPHEN 10-325 MG PO TABS
1.0000 | ORAL_TABLET | Freq: Two times a day (BID) | ORAL | 0 refills | Status: DC | PRN
  Filled 2022-11-21 – ????-??-?? (×2): qty 60, 30d supply, fill #0

## 2022-11-21 ENCOUNTER — Other Ambulatory Visit (HOSPITAL_COMMUNITY): Payer: Self-pay

## 2022-11-22 ENCOUNTER — Inpatient Hospital Stay: Payer: 59

## 2022-11-22 ENCOUNTER — Other Ambulatory Visit (HOSPITAL_COMMUNITY): Payer: Self-pay

## 2022-11-22 ENCOUNTER — Inpatient Hospital Stay: Payer: 59 | Attending: Oncology

## 2022-11-22 ENCOUNTER — Other Ambulatory Visit: Payer: Self-pay

## 2022-11-22 VITALS — BP 170/81 | HR 56 | Temp 98.5°F | Resp 20 | Ht 73.0 in | Wt 150.5 lb

## 2022-11-22 DIAGNOSIS — C9002 Multiple myeloma in relapse: Secondary | ICD-10-CM | POA: Insufficient documentation

## 2022-11-22 DIAGNOSIS — Z95828 Presence of other vascular implants and grafts: Secondary | ICD-10-CM

## 2022-11-22 DIAGNOSIS — Z5112 Encounter for antineoplastic immunotherapy: Secondary | ICD-10-CM | POA: Diagnosis not present

## 2022-11-22 DIAGNOSIS — C9001 Multiple myeloma in remission: Secondary | ICD-10-CM

## 2022-11-22 DIAGNOSIS — Z79620 Long term (current) use of immunosuppressive biologic: Secondary | ICD-10-CM | POA: Diagnosis not present

## 2022-11-22 LAB — COMPREHENSIVE METABOLIC PANEL
ALT: 22 U/L (ref 0–44)
AST: 35 U/L (ref 15–41)
Albumin: 4.1 g/dL (ref 3.5–5.0)
Alkaline Phosphatase: 81 U/L (ref 38–126)
Anion gap: 2 — ABNORMAL LOW (ref 5–15)
BUN: 13 mg/dL (ref 8–23)
CO2: 29 mmol/L (ref 22–32)
Calcium: 8.7 mg/dL — ABNORMAL LOW (ref 8.9–10.3)
Chloride: 107 mmol/L (ref 98–111)
Creatinine, Ser: 0.76 mg/dL (ref 0.61–1.24)
GFR, Estimated: >60 mL/min (ref >60)
Glucose, Bld: 106 mg/dL — ABNORMAL HIGH (ref 70–99)
Potassium: 4.5 mmol/L (ref 3.5–5.1)
Sodium: 138 mmol/L (ref 135–145)
Total Bilirubin: 0.4 mg/dL (ref 0.3–1.2)
Total Protein: 7.1 g/dL (ref 6.5–8.1)

## 2022-11-22 LAB — CBC WITH DIFFERENTIAL (CANCER CENTER ONLY)
Abs Immature Granulocytes: 0.01 10*3/uL (ref 0.00–0.07)
Basophils Absolute: 0 10*3/uL (ref 0.0–0.1)
Basophils Relative: 0 %
Eosinophils Absolute: 0 10*3/uL (ref 0.0–0.5)
Eosinophils Relative: 0 %
HCT: 33.3 % — ABNORMAL LOW (ref 39.0–52.0)
Hemoglobin: 11.4 g/dL — ABNORMAL LOW (ref 13.0–17.0)
Immature Granulocytes: 0 %
Lymphocytes Relative: 44 %
Lymphs Abs: 2 10*3/uL (ref 0.7–4.0)
MCH: 32.9 pg (ref 26.0–34.0)
MCHC: 34.2 g/dL (ref 30.0–36.0)
MCV: 96.2 fL (ref 80.0–100.0)
Monocytes Absolute: 0.3 10*3/uL (ref 0.1–1.0)
Monocytes Relative: 7 %
Neutro Abs: 2.2 10*3/uL (ref 1.7–7.7)
Neutrophils Relative %: 49 %
Platelet Count: 153 10*3/uL (ref 150–400)
RBC: 3.46 MIL/uL — ABNORMAL LOW (ref 4.22–5.81)
RDW: 13 % (ref 11.5–15.5)
WBC Count: 4.5 10*3/uL (ref 4.0–10.5)
nRBC: 0 % (ref 0.0–0.2)

## 2022-11-22 MED ORDER — DIPHENHYDRAMINE HCL 25 MG PO CAPS
50.0000 mg | ORAL_CAPSULE | Freq: Once | ORAL | Status: AC
  Administered 2022-11-22: 50 mg via ORAL
  Filled 2022-11-22: qty 2

## 2022-11-22 MED ORDER — ACETAMINOPHEN 325 MG PO TABS
650.0000 mg | ORAL_TABLET | Freq: Once | ORAL | Status: AC
  Administered 2022-11-22: 650 mg via ORAL
  Filled 2022-11-22: qty 2

## 2022-11-22 MED ORDER — DEXAMETHASONE 4 MG PO TABS
8.0000 mg | ORAL_TABLET | Freq: Once | ORAL | Status: AC
  Administered 2022-11-22: 8 mg via ORAL
  Filled 2022-11-22: qty 2

## 2022-11-22 MED ORDER — SODIUM CHLORIDE 0.9% FLUSH
10.0000 mL | INTRAVENOUS | Status: DC | PRN
  Administered 2022-11-22: 10 mL

## 2022-11-22 MED ORDER — HEPARIN SOD (PORK) LOCK FLUSH 100 UNIT/ML IV SOLN
500.0000 [IU] | Freq: Once | INTRAVENOUS | Status: AC | PRN
  Administered 2022-11-22: 500 [IU]

## 2022-11-22 MED ORDER — DARATUMUMAB-HYALURONIDASE-FIHJ 1800-30000 MG-UT/15ML ~~LOC~~ SOLN
1800.0000 mg | Freq: Once | SUBCUTANEOUS | Status: AC
  Administered 2022-11-22: 1800 mg via SUBCUTANEOUS
  Filled 2022-11-22: qty 15

## 2022-11-22 NOTE — Patient Instructions (Signed)
[facility]  Discharge Instructions: Thank you for choosing [facility] to provide your oncology and hematology care.   If you have a lab appointment with the Cancer Center, please go directly to the Cancer Center and check in at the registration area.   Wear comfortable clothing and clothing appropriate for easy access to any Portacath or PICC line.   We strive to give you quality time with your provider. You may need to reschedule your appointment if you arrive late (15 or more minutes).  Arriving late affects you and other patients whose appointments are after yours.  Also, if you miss three or more appointments without notifying the office, you may be dismissed from the clinic at the provider's discretion.      For prescription refill requests, have your pharmacy contact our office and allow 72 hours for refills to be completed.    Today you received the following chemotherapy and/or immunotherapy agents Darzalex Faspro      To help prevent nausea and vomiting after your treatment, we encourage you to take your nausea medication as directed.  BELOW ARE SYMPTOMS THAT SHOULD BE REPORTED IMMEDIATELY: *FEVER GREATER THAN 100.4 F (38 C) OR HIGHER *CHILLS OR SWEATING *NAUSEA AND VOMITING THAT IS NOT CONTROLLED WITH YOUR NAUSEA MEDICATION *UNUSUAL SHORTNESS OF BREATH *UNUSUAL BRUISING OR BLEEDING *URINARY PROBLEMS (pain or burning when urinating, or frequent urination) *BOWEL PROBLEMS (unusual diarrhea, constipation, pain near the anus) TENDERNESS IN MOUTH AND THROAT WITH OR WITHOUT PRESENCE OF ULCERS (sore throat, sores in mouth, or a toothache) UNUSUAL RASH, SWELLING OR PAIN  UNUSUAL VAGINAL DISCHARGE OR ITCHING   Items with * indicate a potential emergency and should be followed up as soon as possible or go to the Emergency Department if any problems should occur.  Please show the CHEMOTHERAPY ALERT CARD or IMMUNOTHERAPY ALERT CARD at  check-in to the Emergency Department and triage nurse.  Should you have questions after your visit or need to cancel or reschedule your appointment, please contact [facility]  Dept: [phone]  and follow the prompts.  Office hours are 8:00 a.m. to 4:30 p.m. Monday - Friday. Please note that voicemails left after 4:00 p.m. may not be returned until the following business day.  We are closed weekends and major holidays. You have access to a nurse at all times for urgent questions. Please call the main number to the clinic Dept: [phone] and follow the prompts.   For any non-urgent questions, you may also contact your provider using MyChart. We now offer e-Visits for anyone 18 and older to request care online for non-urgent symptoms. For details visit [email or web address].   Also download the MyChart app! Go to the app store, search "MyChart", open the app, select [facility], and log in with your MyChart username and password.  

## 2022-11-25 DIAGNOSIS — M549 Dorsalgia, unspecified: Secondary | ICD-10-CM | POA: Diagnosis not present

## 2022-11-25 DIAGNOSIS — K5900 Constipation, unspecified: Secondary | ICD-10-CM | POA: Diagnosis not present

## 2022-11-25 DIAGNOSIS — I10 Essential (primary) hypertension: Secondary | ICD-10-CM | POA: Diagnosis not present

## 2022-11-29 LAB — MULTIPLE MYELOMA PANEL, SERUM
Albumin SerPl Elph-Mcnc: 3.8 g/dL (ref 2.9–4.4)
Albumin/Glob SerPl: 1.3 (ref 0.7–1.7)
Alpha 1: 0.2 g/dL (ref 0.0–0.4)
Alpha2 Glob SerPl Elph-Mcnc: 0.6 g/dL (ref 0.4–1.0)
B-Globulin SerPl Elph-Mcnc: 0.9 g/dL (ref 0.7–1.3)
Gamma Glob SerPl Elph-Mcnc: 1.4 g/dL (ref 0.4–1.8)
Globulin, Total: 3 g/dL (ref 2.2–3.9)
IgA: 24 mg/dL — ABNORMAL LOW (ref 61–437)
IgG (Immunoglobin G), Serum: 1491 mg/dL (ref 603–1613)
IgM (Immunoglobulin M), Srm: 20 mg/dL (ref 20–172)
M Protein SerPl Elph-Mcnc: 0.9 g/dL — ABNORMAL HIGH
Total Protein ELP: 6.8 g/dL (ref 6.0–8.5)

## 2022-12-17 ENCOUNTER — Other Ambulatory Visit: Payer: Self-pay

## 2022-12-17 ENCOUNTER — Other Ambulatory Visit (HOSPITAL_COMMUNITY): Payer: Self-pay

## 2022-12-17 MED ORDER — OXYCODONE-ACETAMINOPHEN 10-325 MG PO TABS
ORAL_TABLET | ORAL | 0 refills | Status: DC
  Filled 2022-12-19: qty 60, 30d supply, fill #0

## 2022-12-19 ENCOUNTER — Other Ambulatory Visit (HOSPITAL_COMMUNITY): Payer: Self-pay

## 2022-12-20 ENCOUNTER — Other Ambulatory Visit: Payer: Self-pay

## 2022-12-20 ENCOUNTER — Inpatient Hospital Stay: Payer: 59 | Attending: Oncology

## 2022-12-20 ENCOUNTER — Inpatient Hospital Stay: Payer: 59

## 2022-12-20 ENCOUNTER — Inpatient Hospital Stay (HOSPITAL_BASED_OUTPATIENT_CLINIC_OR_DEPARTMENT_OTHER): Payer: 59 | Admitting: Hematology

## 2022-12-20 VITALS — BP 148/93 | HR 60 | Temp 97.9°F | Resp 20 | Wt 150.2 lb

## 2022-12-20 DIAGNOSIS — Z8616 Personal history of COVID-19: Secondary | ICD-10-CM | POA: Diagnosis not present

## 2022-12-20 DIAGNOSIS — Z5112 Encounter for antineoplastic immunotherapy: Secondary | ICD-10-CM | POA: Diagnosis not present

## 2022-12-20 DIAGNOSIS — M549 Dorsalgia, unspecified: Secondary | ICD-10-CM | POA: Insufficient documentation

## 2022-12-20 DIAGNOSIS — Z886 Allergy status to analgesic agent status: Secondary | ICD-10-CM | POA: Insufficient documentation

## 2022-12-20 DIAGNOSIS — Z79620 Long term (current) use of immunosuppressive biologic: Secondary | ICD-10-CM | POA: Insufficient documentation

## 2022-12-20 DIAGNOSIS — Z8042 Family history of malignant neoplasm of prostate: Secondary | ICD-10-CM | POA: Diagnosis not present

## 2022-12-20 DIAGNOSIS — Z8249 Family history of ischemic heart disease and other diseases of the circulatory system: Secondary | ICD-10-CM | POA: Diagnosis not present

## 2022-12-20 DIAGNOSIS — Z9049 Acquired absence of other specified parts of digestive tract: Secondary | ICD-10-CM | POA: Diagnosis not present

## 2022-12-20 DIAGNOSIS — Z8546 Personal history of malignant neoplasm of prostate: Secondary | ICD-10-CM | POA: Insufficient documentation

## 2022-12-20 DIAGNOSIS — Z9079 Acquired absence of other genital organ(s): Secondary | ICD-10-CM | POA: Diagnosis not present

## 2022-12-20 DIAGNOSIS — C9001 Multiple myeloma in remission: Secondary | ICD-10-CM

## 2022-12-20 DIAGNOSIS — Z79899 Other long term (current) drug therapy: Secondary | ICD-10-CM | POA: Insufficient documentation

## 2022-12-20 DIAGNOSIS — Z85820 Personal history of malignant melanoma of skin: Secondary | ICD-10-CM | POA: Insufficient documentation

## 2022-12-20 DIAGNOSIS — M7989 Other specified soft tissue disorders: Secondary | ICD-10-CM | POA: Insufficient documentation

## 2022-12-20 DIAGNOSIS — C9000 Multiple myeloma not having achieved remission: Secondary | ICD-10-CM | POA: Diagnosis not present

## 2022-12-20 DIAGNOSIS — Z87891 Personal history of nicotine dependence: Secondary | ICD-10-CM | POA: Diagnosis not present

## 2022-12-20 DIAGNOSIS — Z95828 Presence of other vascular implants and grafts: Secondary | ICD-10-CM

## 2022-12-20 LAB — CBC WITH DIFFERENTIAL (CANCER CENTER ONLY)
Abs Immature Granulocytes: 0.01 10*3/uL (ref 0.00–0.07)
Basophils Absolute: 0 10*3/uL (ref 0.0–0.1)
Basophils Relative: 0 %
Eosinophils Absolute: 0 10*3/uL (ref 0.0–0.5)
Eosinophils Relative: 0 %
HCT: 36.1 % — ABNORMAL LOW (ref 39.0–52.0)
Hemoglobin: 12.3 g/dL — ABNORMAL LOW (ref 13.0–17.0)
Immature Granulocytes: 0 %
Lymphocytes Relative: 45 %
Lymphs Abs: 2.1 10*3/uL (ref 0.7–4.0)
MCH: 32.7 pg (ref 26.0–34.0)
MCHC: 34.1 g/dL (ref 30.0–36.0)
MCV: 96 fL (ref 80.0–100.0)
Monocytes Absolute: 0.3 10*3/uL (ref 0.1–1.0)
Monocytes Relative: 6 %
Neutro Abs: 2.2 10*3/uL (ref 1.7–7.7)
Neutrophils Relative %: 49 %
Platelet Count: 175 10*3/uL (ref 150–400)
RBC: 3.76 MIL/uL — ABNORMAL LOW (ref 4.22–5.81)
RDW: 12.8 % (ref 11.5–15.5)
WBC Count: 4.6 10*3/uL (ref 4.0–10.5)
nRBC: 0 % (ref 0.0–0.2)

## 2022-12-20 LAB — CMP (CANCER CENTER ONLY)
ALT: 41 U/L (ref 0–44)
AST: 61 U/L — ABNORMAL HIGH (ref 15–41)
Albumin: 4.4 g/dL (ref 3.5–5.0)
Alkaline Phosphatase: 79 U/L (ref 38–126)
Anion gap: 3 — ABNORMAL LOW (ref 5–15)
BUN: 15 mg/dL (ref 8–23)
CO2: 28 mmol/L (ref 22–32)
Calcium: 9.1 mg/dL (ref 8.9–10.3)
Chloride: 103 mmol/L (ref 98–111)
Creatinine: 0.88 mg/dL (ref 0.61–1.24)
GFR, Estimated: >60 mL/min (ref >60)
Glucose, Bld: 99 mg/dL (ref 70–99)
Potassium: 4.4 mmol/L (ref 3.5–5.1)
Sodium: 134 mmol/L — ABNORMAL LOW (ref 135–145)
Total Bilirubin: 0.3 mg/dL (ref 0.3–1.2)
Total Protein: 7.7 g/dL (ref 6.5–8.1)

## 2022-12-20 MED ORDER — ACETAMINOPHEN 325 MG PO TABS
650.0000 mg | ORAL_TABLET | Freq: Once | ORAL | Status: AC
  Administered 2022-12-20: 650 mg via ORAL
  Filled 2022-12-20: qty 2

## 2022-12-20 MED ORDER — DEXAMETHASONE 4 MG PO TABS
8.0000 mg | ORAL_TABLET | Freq: Once | ORAL | Status: AC
  Administered 2022-12-20: 8 mg via ORAL
  Filled 2022-12-20: qty 2

## 2022-12-20 MED ORDER — HEPARIN SOD (PORK) LOCK FLUSH 100 UNIT/ML IV SOLN
500.0000 [IU] | Freq: Once | INTRAVENOUS | Status: AC | PRN
  Administered 2022-12-20: 500 [IU]

## 2022-12-20 MED ORDER — SODIUM CHLORIDE 0.9% FLUSH
10.0000 mL | INTRAVENOUS | Status: DC | PRN
  Administered 2022-12-20: 10 mL

## 2022-12-20 MED ORDER — DARATUMUMAB-HYALURONIDASE-FIHJ 1800-30000 MG-UT/15ML ~~LOC~~ SOLN
1800.0000 mg | Freq: Once | SUBCUTANEOUS | Status: AC
  Administered 2022-12-20: 1800 mg via SUBCUTANEOUS
  Filled 2022-12-20: qty 15

## 2022-12-20 MED ORDER — DIPHENHYDRAMINE HCL 25 MG PO CAPS
50.0000 mg | ORAL_CAPSULE | Freq: Once | ORAL | Status: AC
  Administered 2022-12-20: 50 mg via ORAL
  Filled 2022-12-20: qty 2

## 2022-12-20 NOTE — Progress Notes (Signed)
Patient seen by Dr. Kale  Vitals are within treatment parameters.  Labs reviewed: and are within treatment parameters.  Per physician team, patient is ready for treatment and there are NO modifications to the treatment plan.  

## 2022-12-20 NOTE — Patient Instructions (Signed)
[facility]  Discharge Instructions: Thank you for choosing [facility] to provide your oncology and hematology care.   If you have a lab appointment with the Cancer Center, please go directly to the Cancer Center and check in at the registration area.   Wear comfortable clothing and clothing appropriate for easy access to any Portacath or PICC line.   We strive to give you quality time with your provider. You may need to reschedule your appointment if you arrive late (15 or more minutes).  Arriving late affects you and other patients whose appointments are after yours.  Also, if you miss three or more appointments without notifying the office, you may be dismissed from the clinic at the provider's discretion.      For prescription refill requests, have your pharmacy contact our office and allow 72 hours for refills to be completed.    Today you received the following chemotherapy and/or immunotherapy agents Darzalex Faspro      To help prevent nausea and vomiting after your treatment, we encourage you to take your nausea medication as directed.  BELOW ARE SYMPTOMS THAT SHOULD BE REPORTED IMMEDIATELY: *FEVER GREATER THAN 100.4 F (38 C) OR HIGHER *CHILLS OR SWEATING *NAUSEA AND VOMITING THAT IS NOT CONTROLLED WITH YOUR NAUSEA MEDICATION *UNUSUAL SHORTNESS OF BREATH *UNUSUAL BRUISING OR BLEEDING *URINARY PROBLEMS (pain or burning when urinating, or frequent urination) *BOWEL PROBLEMS (unusual diarrhea, constipation, pain near the anus) TENDERNESS IN MOUTH AND THROAT WITH OR WITHOUT PRESENCE OF ULCERS (sore throat, sores in mouth, or a toothache) UNUSUAL RASH, SWELLING OR PAIN  UNUSUAL VAGINAL DISCHARGE OR ITCHING   Items with * indicate a potential emergency and should be followed up as soon as possible or go to the Emergency Department if any problems should occur.  Please show the CHEMOTHERAPY ALERT CARD or IMMUNOTHERAPY ALERT CARD at  check-in to the Emergency Department and triage nurse.  Should you have questions after your visit or need to cancel or reschedule your appointment, please contact [facility]  Dept: [phone]  and follow the prompts.  Office hours are 8:00 a.m. to 4:30 p.m. Monday - Friday. Please note that voicemails left after 4:00 p.m. may not be returned until the following business day.  We are closed weekends and major holidays. You have access to a nurse at all times for urgent questions. Please call the main number to the clinic Dept: [phone] and follow the prompts.   For any non-urgent questions, you may also contact your provider using MyChart. We now offer e-Visits for anyone 18 and older to request care online for non-urgent symptoms. For details visit [email or web address].   Also download the MyChart app! Go to the app store, search "MyChart", open the app, select [facility], and log in with your MyChart username and password.  
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
## 2022-12-21 ENCOUNTER — Other Ambulatory Visit: Payer: Self-pay

## 2022-12-23 DIAGNOSIS — I10 Essential (primary) hypertension: Secondary | ICD-10-CM | POA: Diagnosis not present

## 2022-12-23 DIAGNOSIS — C9000 Multiple myeloma not having achieved remission: Secondary | ICD-10-CM | POA: Diagnosis not present

## 2022-12-23 DIAGNOSIS — M5416 Radiculopathy, lumbar region: Secondary | ICD-10-CM | POA: Diagnosis not present

## 2022-12-23 DIAGNOSIS — E7800 Pure hypercholesterolemia, unspecified: Secondary | ICD-10-CM | POA: Diagnosis not present

## 2022-12-27 ENCOUNTER — Encounter: Payer: Self-pay | Admitting: Hematology

## 2022-12-27 LAB — MULTIPLE MYELOMA PANEL, SERUM
Albumin SerPl Elph-Mcnc: 3.9 g/dL (ref 2.9–4.4)
Albumin/Glob SerPl: 1.3 (ref 0.7–1.7)
Alpha 1: 0.2 g/dL (ref 0.0–0.4)
Alpha2 Glob SerPl Elph-Mcnc: 0.5 g/dL (ref 0.4–1.0)
B-Globulin SerPl Elph-Mcnc: 0.8 g/dL (ref 0.7–1.3)
Gamma Glob SerPl Elph-Mcnc: 1.5 g/dL (ref 0.4–1.8)
Globulin, Total: 3.1 g/dL (ref 2.2–3.9)
IgA: 22 mg/dL — ABNORMAL LOW (ref 61–437)
IgG (Immunoglobin G), Serum: 1654 mg/dL — ABNORMAL HIGH (ref 603–1613)
IgM (Immunoglobulin M), Srm: 18 mg/dL — ABNORMAL LOW (ref 20–172)
M Protein SerPl Elph-Mcnc: 1.3 g/dL — ABNORMAL HIGH
Total Protein ELP: 7 g/dL (ref 6.0–8.5)

## 2022-12-31 ENCOUNTER — Encounter (HOSPITAL_COMMUNITY): Payer: Self-pay

## 2022-12-31 ENCOUNTER — Other Ambulatory Visit: Payer: Self-pay

## 2022-12-31 ENCOUNTER — Emergency Department (HOSPITAL_COMMUNITY)
Admission: EM | Admit: 2022-12-31 | Discharge: 2022-12-31 | Disposition: A | Discharge status: Home / Self Care | Payer: 59 | Attending: Emergency Medicine | Admitting: Emergency Medicine

## 2022-12-31 DIAGNOSIS — E871 Hypo-osmolality and hyponatremia: Secondary | ICD-10-CM | POA: Diagnosis not present

## 2022-12-31 DIAGNOSIS — M549 Dorsalgia, unspecified: Secondary | ICD-10-CM | POA: Diagnosis present

## 2022-12-31 DIAGNOSIS — M5136 Other intervertebral disc degeneration, lumbar region: Secondary | ICD-10-CM | POA: Diagnosis not present

## 2022-12-31 DIAGNOSIS — D649 Anemia, unspecified: Secondary | ICD-10-CM | POA: Diagnosis not present

## 2022-12-31 DIAGNOSIS — Z8579 Personal history of other malignant neoplasms of lymphoid, hematopoietic and related tissues: Secondary | ICD-10-CM | POA: Diagnosis not present

## 2022-12-31 LAB — CBC WITH DIFFERENTIAL/PLATELET
Abs Immature Granulocytes: 0.01 10*3/uL (ref 0.00–0.07)
Basophils Absolute: 0 10*3/uL (ref 0.0–0.1)
Basophils Relative: 0 %
Eosinophils Absolute: 0 10*3/uL (ref 0.0–0.5)
Eosinophils Relative: 1 %
HCT: 36.3 % — ABNORMAL LOW (ref 39.0–52.0)
Hemoglobin: 12 g/dL — ABNORMAL LOW (ref 13.0–17.0)
Immature Granulocytes: 0 %
Lymphocytes Relative: 42 %
Lymphs Abs: 1.8 10*3/uL (ref 0.7–4.0)
MCH: 32.3 pg (ref 26.0–34.0)
MCHC: 33.1 g/dL (ref 30.0–36.0)
MCV: 97.8 fL (ref 80.0–100.0)
Monocytes Absolute: 0.3 10*3/uL (ref 0.1–1.0)
Monocytes Relative: 7 %
Neutro Abs: 2.2 10*3/uL (ref 1.7–7.7)
Neutrophils Relative %: 50 %
Platelets: 149 10*3/uL — ABNORMAL LOW (ref 150–400)
RBC: 3.71 MIL/uL — ABNORMAL LOW (ref 4.22–5.81)
RDW: 12.7 % (ref 11.5–15.5)
WBC: 4.4 10*3/uL (ref 4.0–10.5)
nRBC: 0 % (ref 0.0–0.2)

## 2022-12-31 LAB — BASIC METABOLIC PANEL
Anion gap: 4 — ABNORMAL LOW (ref 5–15)
BUN: 15 mg/dL (ref 8–23)
CO2: 26 mmol/L (ref 22–32)
Calcium: 9.1 mg/dL (ref 8.9–10.3)
Chloride: 103 mmol/L (ref 98–111)
Creatinine, Ser: 0.86 mg/dL (ref 0.61–1.24)
GFR, Estimated: >60 mL/min (ref >60)
Glucose, Bld: 85 mg/dL (ref 70–99)
Potassium: 4.1 mmol/L (ref 3.5–5.1)
Sodium: 133 mmol/L — ABNORMAL LOW (ref 135–145)

## 2022-12-31 MED ORDER — LIDOCAINE-EPINEPHRINE-TETRACAINE (LET) TOPICAL GEL
3.0000 mL | Freq: Once | TOPICAL | Status: DC
  Filled 2022-12-31: qty 3

## 2022-12-31 MED ORDER — HYDROMORPHONE HCL 1 MG/ML IJ SOLN
0.5000 mg | Freq: Once | INTRAMUSCULAR | Status: DC
  Filled 2022-12-31: qty 1

## 2022-12-31 MED ORDER — FENTANYL CITRATE PF 50 MCG/ML IJ SOSY
25.0000 ug | PREFILLED_SYRINGE | Freq: Once | INTRAMUSCULAR | Status: AC
  Administered 2022-12-31: 25 ug via INTRAVENOUS
  Filled 2022-12-31: qty 1

## 2022-12-31 NOTE — ED Provider Notes (Signed)
[place] EMERGENCY DEPARTMENT AT [facility] Provider Note   CSN: [id number] Arrival date & time: 12/31/22  1528     History {Add pertinent medical, surgical, social history, OB history to HPI:1} Chief Complaint  Patient presents with   Back Pain    Travis Nguyen is a 72 y.o. male. Last BM today. Taking chemo once monthly. Chroninc narcotic use 10-15years. Waiting for pain management x2 weeks.  Denies IVDU. Denies chest pain, dyspnea, fever, abdominal pain. N/v/d.   Back Pain      Home Medications Prior to Admission medications   Medication Sig Start Date End Date Taking? Authorizing Provider  acyclovir (ZOVIRAX) 400 MG tablet Take 1 tablet (400 mg total) by mouth 2 (two) times daily. 07/01/22   Benjiman Core, MD  amLODipine (NORVASC) 10 MG tablet Take 1 tablet by mouth once a day Patient not taking: Reported on 08/30/2022 04/24/21     amLODipine (NORVASC) 5 MG tablet Take 1 tablet (5 mg total) by mouth daily. Patient not taking: Reported on 08/30/2022 02/25/22     amLODipine (NORVASC) 5 MG tablet Take 1 tablet (5 mg total) by mouth daily. 08/12/22     bisacodyl 5 MG EC tablet Take by mouth as directed. 10/17/22   [name], MD  cyclobenzaprine (FLEXERIL) 5 MG tablet Take 1 - 2 tablets by mouth at bedtime as needed Patient not taking: Reported on 10/09/2022 05/20/22     escitalopram (LEXAPRO) 10 MG tablet Take 1 tablet by mouth once a day Patient not taking: Reported on 08/30/2022 04/24/21     escitalopram (LEXAPRO) 10 MG tablet Take 1 tablet by mouth once daily Patient not taking: Reported on 08/30/2022 07/24/21     escitalopram (LEXAPRO) 10 MG tablet Take 1 tablet (10 mg total) by mouth daily. Patient not taking: Reported on 08/30/2022 02/25/22     escitalopram (LEXAPRO) 10 MG tablet Take 1 tablet (10 mg total) by mouth daily. 08/12/22     lidocaine-prilocaine (EMLA) cream APPLY TOPICALLY TO PORT-A-CATH DAILY AS NEEDED 06/28/22 06/28/23  Benjiman Core, MD   lidocaine-prilocaine (EMLA) cream Apply 1 Application topically once. Prior to port access Patient not taking: Reported on 08/30/2022    [provider]  losartan (COZAAR) 100 MG tablet TAKE 1 TABLET BY MOUTH ONCE A DAY 06/06/20 06/06/21  [name], MD  losartan (COZAAR) 100 MG tablet Take 1 tablet by mouth once a day Patient not taking: Reported on 08/30/2022 04/24/21     losartan (COZAAR) 100 MG tablet Take 1 tablet by mouth daily Patient not taking: Reported on 08/30/2022 07/24/21     losartan (COZAAR) 100 MG tablet Take 1 tablet (100 mg total) by mouth daily. 02/25/22     losartan (COZAAR) 100 MG tablet Take 1 tablet (100 mg total) by mouth daily. Patient not taking: Reported on 08/30/2022 08/12/22     oxyCODONE-acetaminophen (PERCOCET) 10-325 MG tablet Take 1 tablet by mouth 2 times a day Patient not taking: Reported on 08/30/2022 12/15/20     oxyCODONE-acetaminophen (PERCOCET) 10-325 MG tablet Take 1 tablet by mouth twice daily as needed (07/05/21) 07/02/21     oxyCODONE-acetaminophen (PERCOCET) 10-325 MG tablet Take 1 tablet by mouth 2 (two) times daily as needed. Patient not taking: Reported on 08/30/2022 08/03/21     oxyCODONE-acetaminophen (PERCOCET) 10-325 MG tablet Take 1 tablet by mouth 2 times a day as needed orally (ok to fill 30 days after last refill) Patient not taking: Reported on 08/30/2022 02/25/22  oxyCODONE-acetaminophen (PERCOCET) 10-325 MG tablet Take 1 tablet by mouth 2 (two) times daily as needed. Patient not taking: Reported on 08/30/2022 04/29/22     oxyCODONE-acetaminophen (PERCOCET) 10-325 MG tablet Take 1 tablet by mouth 2 (two) times daily. okay to fill 30 days after last refill Patient not taking: Reported on 08/30/2022 05/22/22     oxyCODONE-acetaminophen (PERCOCET) 10-325 MG tablet Take 1 tablet by mouth 2 (two) times daily as needed. (ok to fill 30 days after last refill) Patient not taking: Reported on 08/30/2022 06/27/22     oxyCODONE-acetaminophen  (PERCOCET) 10-325 MG tablet Take 1 tablet by mouth 2 (two) times daily as needed Patient not taking: Reported on 10/09/2022 09/23/22     oxyCODONE-acetaminophen (PERCOCET) 10-325 MG tablet Take 1 tablet by mouth 2 (two) times daily as needed. 11/19/22     oxyCODONE-acetaminophen (PERCOCET) 10-325 MG tablet Take 1 tablet by mouth twice daily as needed 12/17/22     pantoprazole (PROTONIX) 20 MG tablet Take 1 tablet (20 mg total) by mouth daily. Patient not taking: Reported on 08/30/2022 01/07/21   Tilden Fossa, MD  polyethylene glycol-electrolytes (NULYTELY) 420 g solution Use as directed. 10/17/22   [name], MD  trimethoprim-polymyxin b (POLYTRIM) ophthalmic solution Place 1 drop into both eyes every 4 (four) hours. Patient not taking: Reported on 10/09/2022 09/28/22   Ernie Avena, MD  vitamin C (ASCORBIC ACID) 500 MG tablet Take 500 mg by mouth daily.    [provider]      Allergies    Aspirin    Review of Systems   Review of Systems  Musculoskeletal:  Positive for back pain.    Physical Exam Updated Vital Signs BP (!) 177/90   Pulse (!) 56   Temp 99.3 F (37.4 C)   Resp 15   Ht 6\' 1"  (1.854 m)   Wt 68 kg   SpO2 98%   BMI 19.79 kg/m  Physical Exam Vitals and nursing note reviewed.  Constitutional:      General: He is not in acute distress.    Appearance: He is not ill-appearing or toxic-appearing.  HENT:     Head: Normocephalic and atraumatic.     Mouth/Throat:     Mouth: Mucous membranes are moist.     Pharynx: No oropharyngeal exudate or posterior oropharyngeal erythema.  Eyes:     General: No scleral icterus.       Right eye: No discharge.        Left eye: No discharge.     Conjunctiva/sclera: Conjunctivae normal.  Cardiovascular:     Rate and Rhythm: Normal rate and regular rhythm.     Pulses: Normal pulses.     Heart sounds: Normal heart sounds. No murmur heard. Pulmonary:     Effort: Pulmonary effort is normal.  Abdominal:     General: Abdomen is  flat. Bowel sounds are normal.     Palpations: Abdomen is soft. There is no mass.     Tenderness: There is no abdominal tenderness. There is no right CVA tenderness or left CVA tenderness.  Musculoskeletal:     Right lower leg: No edema.     Left lower leg: No edema.  Skin:    General: Skin is warm and dry.     Findings: No rash.  Neurological:     General: No focal deficit present.     Mental Status: He is alert. Mental status is at baseline.     Sensory: No sensory deficit.  Motor: No weakness.     Comments: GCS 15. Speech is goal oriented. Patient has equal grip strength bilaterally with 5/5 strength against resistance in all major muscle groups bilaterally. Sensation to light touch intact. Patient moves extremities without ataxia. Patient ambulatory with steady gait.   Psychiatric:        Mood and Affect: Mood normal.        Behavior: Behavior normal.     ED Results / Procedures / Treatments   Labs (all labs ordered are listed, but only abnormal results are displayed) Labs Reviewed - No data to display  EKG None  Radiology No results found.  Procedures Procedures  {Document cardiac monitor, telemetry assessment procedure when appropriate:1}  Medications Ordered in ED Medications - No data to display  ED Course/ Medical Decision Making/ A&P   {   Click here for ABCD2, HEART and other calculatorsREFRESH Note before signing :1}                          Medical Decision Making Amount and/or Complexity of Data Reviewed Labs: ordered.  Risk Prescription drug management.   This patient presents to the ED for concern of back pain, this involves an extensive number of treatment options, and is a complaint that carries with it a high risk of complications and morbidity.  The differential diagnosis includes pyelonephritis, nephrolithiasis, spinal abscess, osteomyelitis, herniated disc, muscle strain, spinal fracture, meningitis, cancer, cauda equina syndrome.   Co  morbidities that complicate the patient evaluation  ***   Additional history obtained:  Additional history obtained from *** External records from outside source obtained and reviewed including ***   Lab Tests:  I Ordered, and personally interpreted labs.  The pertinent results include:  ***   Imaging Studies ordered:  I ordered imaging studies including ***  I independently visualized and interpreted imaging which showed *** I agree with the radiologist interpretation   Cardiac Monitoring: / EKG:  The patient was maintained on a cardiac monitor.  I personally viewed and interpreted the cardiac monitored which showed an underlying rhythm of: ***   Consultations Obtained:  I requested consultation with the ***,  and discussed lab and imaging findings as well as pertinent plan - they recommend: ***   Problem List / ED Course / Critical interventions / Medication management  Patient requesting to go home. Patient denies urinary retention, fecal incontinence, saddle anesthesia, lower extremity weakness, hx of cancer, fever, immunosuppression, IVDU, spinal procedure, significant trauma - so imaging was not appropriate at this time. Will proceed with conservative therapy and have patient follow up with PCP. I have reviewed the patients home medicines and have made adjustments as needed Patient afebrile with stable vitals. Patient ready for discharge. Provided patient with  return precaution.  Ddx: these are considered less likely due to history of present illness and physical exam -Pyelonephritis/nephrolithiasis: no abdominal or flank pain; no urinary symptoms -spinal abscess: no fever, no skin findings, no history of IVDU -osteomyelitis: no history of IVDU; pain is acute -herniated disc: pain is bilateral *** -muscle strain: *** -spinal fracture: not a high force trauma associated with pain -meningitis: no fever; lack of meningism symptoms -cancer: symptoms are  acute -cauda equina syndrome: denies saddle paresthesia, urinary retention, fecal incontinence   Social Determinants of Health:  ***   Test / Admission - Considered:  ***   {Document critical care time when appropriate:1} {Document review of labs and clinical decision tools ie  heart score, Chads2Vasc2 etc:1}  {Document your independent review of radiology images, and any outside records:1} {Document your discussion with family members, caretakers, and with consultants:1} {Document social determinants of health affecting pt's care:1} {Document your decision making why or why not admission, treatments were needed:1} Final Clinical Impression(s) / ED Diagnoses Final diagnoses:  None    Rx / DC Orders ED Discharge Orders     None

## 2022-12-31 NOTE — Discharge Instructions (Addendum)
It was a pleasure caring for you today.  Lab workup was reassuring.  I recommend following up with your primary care provider.  Seek emergency care if experiencing any new or worsening symptoms.

## 2022-12-31 NOTE — ED Triage Notes (Addendum)
Pt c/o chronic low back pain.  Pain score 10/10.  Pt reports his prescribed medication is not working anymore and he is waiting to be seen at a pain clinic. Hx of bone CA and gets chemo once per month.

## 2023-01-06 ENCOUNTER — Encounter (HOSPITAL_COMMUNITY): Payer: Self-pay

## 2023-01-06 ENCOUNTER — Other Ambulatory Visit: Payer: Self-pay

## 2023-01-06 ENCOUNTER — Other Ambulatory Visit (HOSPITAL_COMMUNITY): Payer: Self-pay

## 2023-01-06 ENCOUNTER — Emergency Department (HOSPITAL_COMMUNITY)
Admission: EM | Admit: 2023-01-06 | Discharge: 2023-01-06 | Disposition: A | Discharge status: Home / Self Care | Payer: 59 | Attending: Emergency Medicine | Admitting: Emergency Medicine

## 2023-01-06 DIAGNOSIS — M5440 Lumbago with sciatica, unspecified side: Secondary | ICD-10-CM

## 2023-01-06 DIAGNOSIS — M5441 Lumbago with sciatica, right side: Secondary | ICD-10-CM | POA: Diagnosis not present

## 2023-01-06 DIAGNOSIS — Z79899 Other long term (current) drug therapy: Secondary | ICD-10-CM | POA: Diagnosis not present

## 2023-01-06 DIAGNOSIS — M545 Low back pain, unspecified: Secondary | ICD-10-CM | POA: Diagnosis present

## 2023-01-06 DIAGNOSIS — Z8579 Personal history of other malignant neoplasms of lymphoid, hematopoietic and related tissues: Secondary | ICD-10-CM | POA: Insufficient documentation

## 2023-01-06 MED ORDER — HYDROMORPHONE HCL 4 MG PO TABS
4.0000 mg | ORAL_TABLET | Freq: Four times a day (QID) | ORAL | 0 refills | Status: DC | PRN
  Filled 2023-01-06: qty 20, 5d supply, fill #0

## 2023-01-06 MED ORDER — METHYLPREDNISOLONE SODIUM SUCC 125 MG IJ SOLR
125.0000 mg | Freq: Once | INTRAMUSCULAR | Status: AC
  Administered 2023-01-06: 125 mg via INTRAMUSCULAR
  Filled 2023-01-06: qty 2

## 2023-01-06 MED ORDER — HYDROMORPHONE HCL 4 MG PO TABS: 4.0000 mg | ORAL_TABLET | Freq: Four times a day (QID) | ORAL | 0 refills | Status: DC | PRN

## 2023-01-06 MED ORDER — HYDROMORPHONE HCL 1 MG/ML IJ SOLN
1.0000 mg | Freq: Once | INTRAMUSCULAR | Status: AC
  Administered 2023-01-06: 1 mg via INTRAMUSCULAR
  Filled 2023-01-06: qty 1

## 2023-01-06 MED ORDER — PREDNISONE 10 MG PO TABS
20.0000 mg | ORAL_TABLET | Freq: Every day | ORAL | 0 refills | Status: DC
  Filled 2023-01-06: qty 15, 7d supply, fill #0

## 2023-01-06 NOTE — ED Notes (Signed)
Pt given urinal for bathroom needs.

## 2023-01-06 NOTE — Discharge Instructions (Signed)
Follow up with you pain clinic as planned.  Have your bp checked in a week

## 2023-01-06 NOTE — ED Triage Notes (Addendum)
Patient reports chronic low back pain that is not resolving with prescribed percocet. Patient waiting for appointment with pain management. Patient is getting active chemo for multiple myeloma. Patient's BP is 187/87 on arrival, but has not taken his BP medications today.

## 2023-01-06 NOTE — ED Provider Notes (Incomplete)
[place] [facility] AT [facility] Provider Note   CSN: [id number] Arrival date & time: 01/06/23  4098     History {Add pertinent medical, surgical, social history, OB history to HPI:1} Chief Complaint  Patient presents with   Back Pain    GENERAL WEARING is a 72 y.o. male.  Patient has a history of prostate cancer and complains of lower back pain.  His doctor has contacted a pain clinic for him to follow-up with for the back.   Back Pain      Home Medications Prior to Admission medications   Medication Sig Start Date End Date Taking? Authorizing Provider  predniSONE (DELTASONE) 10 MG tablet Take 2 tablets (20 mg total) by mouth daily. 01/06/23  Yes [name], MD  acyclovir (ZOVIRAX) 400 MG tablet Take 1 tablet (400 mg total) by mouth 2 (two) times daily. 07/01/22   Benjiman Core, MD  amLODipine (NORVASC) 10 MG tablet Take 1 tablet by mouth once a day Patient not taking: Reported on 08/30/2022 04/24/21     amLODipine (NORVASC) 5 MG tablet Take 1 tablet (5 mg total) by mouth daily. Patient not taking: Reported on 08/30/2022 02/25/22     amLODipine (NORVASC) 5 MG tablet Take 1 tablet (5 mg total) by mouth daily. 08/12/22     bisacodyl 5 MG EC tablet Take by mouth as directed. 10/17/22   [name], MD  cyclobenzaprine (FLEXERIL) 5 MG tablet Take 1 - 2 tablets by mouth at bedtime as needed Patient not taking: Reported on 10/09/2022 05/20/22     escitalopram (LEXAPRO) 10 MG tablet Take 1 tablet by mouth once a day Patient not taking: Reported on 08/30/2022 04/24/21     escitalopram (LEXAPRO) 10 MG tablet Take 1 tablet by mouth once daily Patient not taking: Reported on 08/30/2022 07/24/21     escitalopram (LEXAPRO) 10 MG tablet Take 1 tablet (10 mg total) by mouth daily. Patient not taking: Reported on 08/30/2022 02/25/22     escitalopram (LEXAPRO) 10 MG tablet Take 1 tablet (10 mg total) by mouth daily. 08/12/22     HYDROmorphone (DILAUDID) 4 MG tablet  Take 1 tablet (4 mg total) by mouth every 6 (six) hours as needed for severe pain. 01/06/23   [name], MD  lidocaine-prilocaine (EMLA) cream APPLY TOPICALLY TO PORT-A-CATH DAILY AS NEEDED 06/28/22 06/28/23  Benjiman Core, MD  lidocaine-prilocaine (EMLA) cream Apply 1 Application topically once. Prior to port access Patient not taking: Reported on 08/30/2022    [provider]  losartan (COZAAR) 100 MG tablet TAKE 1 TABLET BY MOUTH ONCE A DAY 06/06/20 06/06/21  [name], MD  losartan (COZAAR) 100 MG tablet Take 1 tablet by mouth once a day Patient not taking: Reported on 08/30/2022 04/24/21     losartan (COZAAR) 100 MG tablet Take 1 tablet by mouth daily Patient not taking: Reported on 08/30/2022 07/24/21     losartan (COZAAR) 100 MG tablet Take 1 tablet (100 mg total) by mouth daily. 02/25/22     losartan (COZAAR) 100 MG tablet Take 1 tablet (100 mg total) by mouth daily. Patient not taking: Reported on 08/30/2022 08/12/22     oxyCODONE-acetaminophen (PERCOCET) 10-325 MG tablet Take 1 tablet by mouth 2 times a day Patient not taking: Reported on 08/30/2022 12/15/20     oxyCODONE-acetaminophen (PERCOCET) 10-325 MG tablet Take 1 tablet by mouth twice daily as needed (07/05/21) 07/02/21     oxyCODONE-acetaminophen (PERCOCET) 10-325 MG tablet Take 1 tablet by  mouth 2 (two) times daily as needed. Patient not taking: Reported on 08/30/2022 08/03/21     oxyCODONE-acetaminophen (PERCOCET) 10-325 MG tablet Take 1 tablet by mouth 2 times a day as needed orally (ok to fill 30 days after last refill) Patient not taking: Reported on 08/30/2022 02/25/22     oxyCODONE-acetaminophen (PERCOCET) 10-325 MG tablet Take 1 tablet by mouth 2 (two) times daily as needed. Patient not taking: Reported on 08/30/2022 04/29/22     oxyCODONE-acetaminophen (PERCOCET) 10-325 MG tablet Take 1 tablet by mouth 2 (two) times daily. okay to fill 30 days after last refill Patient not taking: Reported on 08/30/2022 05/22/22      oxyCODONE-acetaminophen (PERCOCET) 10-325 MG tablet Take 1 tablet by mouth 2 (two) times daily as needed. (ok to fill 30 days after last refill) Patient not taking: Reported on 08/30/2022 06/27/22     oxyCODONE-acetaminophen (PERCOCET) 10-325 MG tablet Take 1 tablet by mouth 2 (two) times daily as needed Patient not taking: Reported on 10/09/2022 09/23/22     oxyCODONE-acetaminophen (PERCOCET) 10-325 MG tablet Take 1 tablet by mouth 2 (two) times daily as needed. 11/19/22     oxyCODONE-acetaminophen (PERCOCET) 10-325 MG tablet Take 1 tablet by mouth twice daily as needed 12/17/22     pantoprazole (PROTONIX) 20 MG tablet Take 1 tablet (20 mg total) by mouth daily. Patient not taking: Reported on 08/30/2022 01/07/21   Tilden Fossa, MD  polyethylene glycol-electrolytes (NULYTELY) 420 g solution Use as directed. 10/17/22   [name], MD  trimethoprim-polymyxin b (POLYTRIM) ophthalmic solution Place 1 drop into both eyes every 4 (four) hours. Patient not taking: Reported on 10/09/2022 09/28/22   Ernie Avena, MD  vitamin C (ASCORBIC ACID) 500 MG tablet Take 500 mg by mouth daily.    [provider]      Allergies    Aspirin    Review of Systems   Review of Systems  Musculoskeletal:  Positive for back pain.    Physical Exam Updated Vital Signs BP (!) 187/87   Pulse (!) 55   Temp 98.1 F (36.7 C) (Oral)   Resp 18   Ht 6\' 1"  (1.854 m)   Wt 68 kg   SpO2 100%   BMI 19.79 kg/m  Physical Exam  ED Results / Procedures / Treatments   Labs (all labs ordered are listed, but only abnormal results are displayed) Labs Reviewed - No data to display  EKG None  Radiology No results found.  Procedures Procedures  {Document cardiac monitor, telemetry assessment procedure when appropriate:1}  Medications Ordered in ED Medications  methylPREDNISolone sodium succinate (SOLU-MEDROL) 125 mg/2 mL injection 125 mg (125 mg Intramuscular Given 01/06/23 0802)  HYDROmorphone (DILAUDID)  injection 1 mg (1 mg Intramuscular Given 01/06/23 0800)    ED Course/ Medical Decision Making/ A&P   {Patient with exacerbation of chronic back pain.  He was given steroids and Dilaudid and feels better.  He will be given a prescription and will follow-up with his doctor Click here for ABCD2, HEART and other calculatorsREFRESH Note before signing :1}                          Medical Decision Making Risk Prescription drug management.   Exacerbation of chronic back pain.  Patient improved with Dilaudid and Solu-Medrol  {Document critical care time when appropriate:1} {Document review of labs and clinical decision tools ie heart score, Chads2Vasc2 etc:1}  {Document your independent review of radiology images, and any  outside records:1} {Document your discussion with family members, caretakers, and with consultants:1} {Document social determinants of health affecting pt's care:1} {Document your decision making why or why not admission, treatments were needed:1} Final Clinical Impression(s) / ED Diagnoses Final diagnoses:  Low back pain with sciatica, sciatica laterality unspecified, unspecified back pain laterality, unspecified chronicity    Rx / DC Orders ED Discharge Orders          Ordered    predniSONE (DELTASONE) 10 MG tablet  Daily        01/06/23 0857    HYDROmorphone (DILAUDID) 4 MG tablet  Every 6 hours PRN,   Status:  Discontinued        01/06/23 0857    HYDROmorphone (DILAUDID) 4 MG tablet  Every 6 hours PRN        01/06/23 0858

## 2023-01-08 ENCOUNTER — Other Ambulatory Visit (HOSPITAL_COMMUNITY): Payer: Self-pay

## 2023-01-09 ENCOUNTER — Other Ambulatory Visit (HOSPITAL_COMMUNITY): Payer: Self-pay

## 2023-01-09 ENCOUNTER — Encounter: Payer: Self-pay | Admitting: Hematology

## 2023-01-09 ENCOUNTER — Other Ambulatory Visit: Payer: Self-pay

## 2023-01-10 ENCOUNTER — Other Ambulatory Visit (HOSPITAL_COMMUNITY): Payer: Self-pay

## 2023-01-13 ENCOUNTER — Other Ambulatory Visit (HOSPITAL_COMMUNITY): Payer: Self-pay

## 2023-01-14 ENCOUNTER — Other Ambulatory Visit (HOSPITAL_COMMUNITY): Payer: Self-pay

## 2023-01-14 MED ORDER — OXYCODONE-ACETAMINOPHEN 10-325 MG PO TABS
1.0000 | ORAL_TABLET | Freq: Two times a day (BID) | ORAL | 0 refills | Status: DC
  Filled 2023-01-14 – 2023-01-15 (×2): qty 60, 30d supply, fill #0

## 2023-01-15 ENCOUNTER — Other Ambulatory Visit: Payer: Self-pay

## 2023-01-15 ENCOUNTER — Other Ambulatory Visit (HOSPITAL_COMMUNITY): Payer: Self-pay

## 2023-01-17 ENCOUNTER — Inpatient Hospital Stay: Payer: 59 | Attending: Oncology

## 2023-01-17 ENCOUNTER — Inpatient Hospital Stay: Payer: 59

## 2023-01-17 ENCOUNTER — Other Ambulatory Visit: Payer: 59

## 2023-01-17 ENCOUNTER — Ambulatory Visit: Payer: 59 | Admitting: Physician Assistant

## 2023-01-17 ENCOUNTER — Other Ambulatory Visit (HOSPITAL_COMMUNITY): Payer: Self-pay

## 2023-01-17 ENCOUNTER — Ambulatory Visit: Payer: 59

## 2023-01-17 VITALS — BP 178/83 | HR 59 | Temp 97.8°F | Resp 18

## 2023-01-17 DIAGNOSIS — Z95828 Presence of other vascular implants and grafts: Secondary | ICD-10-CM

## 2023-01-17 DIAGNOSIS — Z79620 Long term (current) use of immunosuppressive biologic: Secondary | ICD-10-CM | POA: Diagnosis not present

## 2023-01-17 DIAGNOSIS — C9001 Multiple myeloma in remission: Secondary | ICD-10-CM

## 2023-01-17 DIAGNOSIS — Z5112 Encounter for antineoplastic immunotherapy: Secondary | ICD-10-CM | POA: Diagnosis not present

## 2023-01-17 DIAGNOSIS — Z8546 Personal history of malignant neoplasm of prostate: Secondary | ICD-10-CM | POA: Diagnosis not present

## 2023-01-17 DIAGNOSIS — Z87891 Personal history of nicotine dependence: Secondary | ICD-10-CM | POA: Insufficient documentation

## 2023-01-17 DIAGNOSIS — Z79899 Other long term (current) drug therapy: Secondary | ICD-10-CM | POA: Diagnosis not present

## 2023-01-17 DIAGNOSIS — Z8042 Family history of malignant neoplasm of prostate: Secondary | ICD-10-CM | POA: Insufficient documentation

## 2023-01-17 DIAGNOSIS — D649 Anemia, unspecified: Secondary | ICD-10-CM | POA: Insufficient documentation

## 2023-01-17 DIAGNOSIS — C9000 Multiple myeloma not having achieved remission: Secondary | ICD-10-CM | POA: Insufficient documentation

## 2023-01-17 DIAGNOSIS — Z8249 Family history of ischemic heart disease and other diseases of the circulatory system: Secondary | ICD-10-CM | POA: Insufficient documentation

## 2023-01-17 LAB — CBC WITH DIFFERENTIAL (CANCER CENTER ONLY)
Abs Immature Granulocytes: 0.01 10*3/uL (ref 0.00–0.07)
Basophils Absolute: 0 10*3/uL (ref 0.0–0.1)
Basophils Relative: 0 %
Eosinophils Absolute: 0 10*3/uL (ref 0.0–0.5)
Eosinophils Relative: 0 %
HCT: 38.6 % — ABNORMAL LOW (ref 39.0–52.0)
Hemoglobin: 12.7 g/dL — ABNORMAL LOW (ref 13.0–17.0)
Immature Granulocytes: 0 %
Lymphocytes Relative: 45 %
Lymphs Abs: 1.9 10*3/uL (ref 0.7–4.0)
MCH: 32.9 pg (ref 26.0–34.0)
MCHC: 32.9 g/dL (ref 30.0–36.0)
MCV: 100 fL (ref 80.0–100.0)
Monocytes Absolute: 0.3 10*3/uL (ref 0.1–1.0)
Monocytes Relative: 6 %
Neutro Abs: 2.1 10*3/uL (ref 1.7–7.7)
Neutrophils Relative %: 49 %
Platelet Count: 162 10*3/uL (ref 150–400)
RBC: 3.86 MIL/uL — ABNORMAL LOW (ref 4.22–5.81)
RDW: 12.5 % (ref 11.5–15.5)
WBC Count: 4.3 10*3/uL (ref 4.0–10.5)
nRBC: 0 % (ref 0.0–0.2)

## 2023-01-17 MED ORDER — DARATUMUMAB-HYALURONIDASE-FIHJ 1800-30000 MG-UT/15ML ~~LOC~~ SOLN
1800.0000 mg | Freq: Once | SUBCUTANEOUS | Status: AC
  Administered 2023-01-17: 1800 mg via SUBCUTANEOUS
  Filled 2023-01-17: qty 15

## 2023-01-17 MED ORDER — ACETAMINOPHEN 325 MG PO TABS
650.0000 mg | ORAL_TABLET | Freq: Once | ORAL | Status: AC
  Administered 2023-01-17: 650 mg via ORAL
  Filled 2023-01-17: qty 2

## 2023-01-17 MED ORDER — DIPHENHYDRAMINE HCL 25 MG PO CAPS
50.0000 mg | ORAL_CAPSULE | Freq: Once | ORAL | Status: AC
  Administered 2023-01-17: 50 mg via ORAL
  Filled 2023-01-17: qty 2

## 2023-01-17 MED ORDER — DEXAMETHASONE 4 MG PO TABS
8.0000 mg | ORAL_TABLET | Freq: Once | ORAL | Status: AC
  Administered 2023-01-17: 8 mg via ORAL
  Filled 2023-01-17: qty 2

## 2023-01-17 MED ORDER — SODIUM CHLORIDE 0.9% FLUSH
10.0000 mL | INTRAVENOUS | Status: DC | PRN
  Administered 2023-01-17: 10 mL

## 2023-01-17 MED ORDER — ALTEPLASE 2 MG IJ SOLR
2.0000 mg | Freq: Once | INTRAMUSCULAR | Status: AC | PRN
  Administered 2023-01-17: 2 mg
  Filled 2023-01-17: qty 2

## 2023-01-19 ENCOUNTER — Other Ambulatory Visit: Payer: Self-pay

## 2023-01-20 ENCOUNTER — Emergency Department (HOSPITAL_COMMUNITY)
Admission: EM | Admit: 2023-01-20 | Discharge: 2023-01-20 | Disposition: A | Discharge status: Home / Self Care | Payer: 59 | Attending: Emergency Medicine | Admitting: Emergency Medicine

## 2023-01-20 ENCOUNTER — Emergency Department (HOSPITAL_COMMUNITY): Payer: 59

## 2023-01-20 ENCOUNTER — Encounter: Payer: Self-pay | Admitting: Hematology

## 2023-01-20 ENCOUNTER — Other Ambulatory Visit: Payer: Self-pay

## 2023-01-20 ENCOUNTER — Encounter (HOSPITAL_COMMUNITY): Payer: Self-pay | Admitting: Emergency Medicine

## 2023-01-20 ENCOUNTER — Other Ambulatory Visit (HOSPITAL_COMMUNITY): Payer: Self-pay

## 2023-01-20 DIAGNOSIS — Z8579 Personal history of other malignant neoplasms of lymphoid, hematopoietic and related tissues: Secondary | ICD-10-CM | POA: Insufficient documentation

## 2023-01-20 DIAGNOSIS — Z8546 Personal history of malignant neoplasm of prostate: Secondary | ICD-10-CM | POA: Insufficient documentation

## 2023-01-20 DIAGNOSIS — Z8616 Personal history of COVID-19: Secondary | ICD-10-CM | POA: Diagnosis not present

## 2023-01-20 DIAGNOSIS — R1084 Generalized abdominal pain: Secondary | ICD-10-CM

## 2023-01-20 DIAGNOSIS — Z85820 Personal history of malignant melanoma of skin: Secondary | ICD-10-CM | POA: Insufficient documentation

## 2023-01-20 DIAGNOSIS — D649 Anemia, unspecified: Secondary | ICD-10-CM | POA: Insufficient documentation

## 2023-01-20 DIAGNOSIS — K760 Fatty (change of) liver, not elsewhere classified: Secondary | ICD-10-CM | POA: Diagnosis not present

## 2023-01-20 DIAGNOSIS — R109 Unspecified abdominal pain: Secondary | ICD-10-CM | POA: Diagnosis present

## 2023-01-20 DIAGNOSIS — I10 Essential (primary) hypertension: Secondary | ICD-10-CM | POA: Insufficient documentation

## 2023-01-20 DIAGNOSIS — K5900 Constipation, unspecified: Secondary | ICD-10-CM | POA: Diagnosis not present

## 2023-01-20 DIAGNOSIS — N3289 Other specified disorders of bladder: Secondary | ICD-10-CM | POA: Diagnosis not present

## 2023-01-20 LAB — CBC WITH DIFFERENTIAL/PLATELET
Abs Immature Granulocytes: 0.01 10*3/uL (ref 0.00–0.07)
Basophils Absolute: 0 10*3/uL (ref 0.0–0.1)
Basophils Relative: 0 %
Eosinophils Absolute: 0 10*3/uL (ref 0.0–0.5)
Eosinophils Relative: 0 %
HCT: 35.1 % — ABNORMAL LOW (ref 39.0–52.0)
Hemoglobin: 11.6 g/dL — ABNORMAL LOW (ref 13.0–17.0)
Immature Granulocytes: 0 %
Lymphocytes Relative: 35 %
Lymphs Abs: 1.7 10*3/uL (ref 0.7–4.0)
MCH: 32.9 pg (ref 26.0–34.0)
MCHC: 33 g/dL (ref 30.0–36.0)
MCV: 99.4 fL (ref 80.0–100.0)
Monocytes Absolute: 0.3 10*3/uL (ref 0.1–1.0)
Monocytes Relative: 6 %
Neutro Abs: 2.9 10*3/uL (ref 1.7–7.7)
Neutrophils Relative %: 59 %
Platelets: 148 10*3/uL — ABNORMAL LOW (ref 150–400)
RBC: 3.53 MIL/uL — ABNORMAL LOW (ref 4.22–5.81)
RDW: 12.7 % (ref 11.5–15.5)
WBC: 5 10*3/uL (ref 4.0–10.5)
nRBC: 0 % (ref 0.0–0.2)

## 2023-01-20 LAB — COMPREHENSIVE METABOLIC PANEL
ALT: 30 U/L (ref 0–44)
AST: 19 U/L (ref 15–41)
Albumin: 3.6 g/dL (ref 3.5–5.0)
Alkaline Phosphatase: 63 U/L (ref 38–126)
Anion gap: 6 (ref 5–15)
BUN: 17 mg/dL (ref 8–23)
CO2: 24 mmol/L (ref 22–32)
Calcium: 8.6 mg/dL — ABNORMAL LOW (ref 8.9–10.3)
Chloride: 103 mmol/L (ref 98–111)
Creatinine, Ser: 0.97 mg/dL (ref 0.61–1.24)
GFR, Estimated: >60 mL/min (ref >60)
Glucose, Bld: 113 mg/dL — ABNORMAL HIGH (ref 70–99)
Potassium: 4 mmol/L (ref 3.5–5.1)
Sodium: 133 mmol/L — ABNORMAL LOW (ref 135–145)
Total Bilirubin: 0.3 mg/dL (ref 0.3–1.2)
Total Protein: 7 g/dL (ref 6.5–8.1)

## 2023-01-20 LAB — LIPASE, BLOOD: Lipase: 38 U/L (ref 11–51)

## 2023-01-20 MED ORDER — SENNOSIDES-DOCUSATE SODIUM 8.6-50 MG PO TABS
1.0000 | ORAL_TABLET | Freq: Every evening | ORAL | 0 refills | Status: DC | PRN
  Filled 2023-01-20: qty 20, 20d supply, fill #0

## 2023-01-20 MED ORDER — SODIUM CHLORIDE 0.9 % IV BOLUS
500.0000 mL | Freq: Once | INTRAVENOUS | Status: AC
  Administered 2023-01-20: 500 mL via INTRAVENOUS

## 2023-01-20 MED ORDER — SODIUM CHLORIDE (PF) 0.9 % IJ SOLN
INTRAMUSCULAR | Status: AC
  Filled 2023-01-20: qty 50

## 2023-01-20 MED ORDER — IOHEXOL 300 MG/ML  SOLN
100.0000 mL | Freq: Once | INTRAMUSCULAR | Status: AC | PRN
  Administered 2023-01-20: 100 mL via INTRAVENOUS

## 2023-01-20 NOTE — ED Triage Notes (Signed)
Pt reports last BM yesterday and was normal. States when he had his last colonoscopy (months ago), he was told he had a knot on his intestines. States he has been taking miralax. Denies abd pain. States he just feels constipated all the time.

## 2023-01-20 NOTE — Discharge Instructions (Signed)
You have been seen in the Emergency Department (ED) for abdominal pain.  Your evaluation did not identify a clear cause of your symptoms but was generally reassuring. Please take the constipation medication as directed.   Please follow up as instructed above regarding today's emergent visit and the symptoms that are bothering you.  Return to the ED if your abdominal pain worsens or fails to improve, you develop bloody vomiting, bloody diarrhea, you are unable to tolerate fluids due to vomiting, fever greater than 101, or other symptoms that concern you.

## 2023-01-20 NOTE — ED Provider Notes (Signed)
Emergency Department Provider Note   I have reviewed the triage vital signs and the nursing notes.   HISTORY  Chief Complaint Constipation   HPI Travis Nguyen is a 72 y.o. male past history reviewed below presents emergency department evaluation of constipation.  She tells me that he has to strain and push to have a bowel movement which is often large.  His last bowel movement was yesterday.  He had had some abdominal discomfort but no significant pain this morning.  He continues to pass flatus.  No vomiting.  Notes a prior history of bowel obstruction requiring hospitalization. No fever. Denies any CP or SOB symptoms.  Patient tells me that he had a colonoscopy with [facility] approximately 2 months prior and was told he had "twisting" in his intestines.  He recalls that result in the setting of his symptoms today and presents to the ED for evaluation.    Past Medical History:  Diagnosis Date   Anemia    hx of    Arthritis    DJD lower back   Back pain    COVID-19    Gall stones    hx of   Heart murmur    asymptomatic    Hypertension    Melanoma (HCC)    does not have melanoma!!! (per pt)   Multiple myeloma (HCC) dx'd 10/2009   chemo   Pneumonia    x1   Prostate cancer (HCC) 11/2012   gleason 3+4=7, volume 24 gm   Sinus problem     Review of Systems  Constitutional: No fever/chills Cardiovascular: Denies chest pain. Respiratory: Denies shortness of breath. Gastrointestinal: Denies abdominal pain.  No nausea, no vomiting.  No diarrhea. Positive constipation. Genitourinary: Negative for dysuria. Musculoskeletal: Negative for back pain. Skin: Negative for rash. Neurological: Negative for headaches.  ____________________________________________   PHYSICAL EXAM:  VITAL SIGNS: ED Triage Vitals  Enc Vitals Group     BP 01/20/23 0721 (!) 157/82     Pulse Rate 01/20/23 0721 65     Resp 01/20/23 0721 18     Temp 01/20/23 0721 98.5 F (36.9 C)     Temp  Source 01/20/23 0721 Oral     SpO2 01/20/23 0721 100 %     Weight 01/20/23 0722 149 lb 14.6 oz (68 kg)     Height 01/20/23 0722 6\' 1"  (1.854 m)   Constitutional: Alert and oriented. Well appearing and in no acute distress. Eyes: Conjunctivae are normal.  Head: Atraumatic. Nose: No congestion/rhinnorhea. Mouth/Throat: Mucous membranes are moist.   Neck: No stridor.   Cardiovascular: Normal rate, regular rhythm. Good peripheral circulation. Grossly normal heart sounds.   Respiratory: Normal respiratory effort.  No retractions. Lungs CTAB. Gastrointestinal: Soft with minimal diffuse tenderness. No peritonitis. No distention.  Musculoskeletal: No gross deformities of extremities. Neurologic:  Normal speech and language.  Skin:  Skin is warm, dry and intact. No rash noted.   ____________________________________________   LABS (all labs ordered are listed, but only abnormal results are displayed)  Labs Reviewed  COMPREHENSIVE METABOLIC PANEL - Abnormal; Notable for the following components:      Result Value   Sodium 133 (*)    Glucose, Bld 113 (*)    Calcium 8.6 (*)    All other components within normal limits  CBC WITH DIFFERENTIAL/PLATELET - Abnormal; Notable for the following components:   RBC 3.53 (*)    Hemoglobin 11.6 (*)    HCT 35.1 (*)    Platelets 148 (*)  All other components within normal limits  LIPASE, BLOOD   ___________________________________  RADIOLOGY  CT ABDOMEN PELVIS W CONTRAST  Result Date: 01/20/2023 CLINICAL DATA:  Bowel obstruction suspected EXAM: CT ABDOMEN AND PELVIS WITH CONTRAST TECHNIQUE: Multidetector CT imaging of the abdomen and pelvis was performed using the standard protocol following bolus administration of intravenous contrast. RADIATION DOSE REDUCTION: This exam was performed according to the departmental dose-optimization program which includes automated exposure control, adjustment of the mA and/or kV according to patient size and/or  use of iterative reconstruction technique. CONTRAST:  OMNIPAQUE IOHEXOL 300 MG/ML  SOLN COMPARISON:  CT scan abdomen and pelvis from 07/03/2017. FINDINGS: Lower chest: The lung bases are clear. No pleural effusion. The heart is normal in size. No pericardial effusion. Hepatobiliary: The liver is normal in size. Non-cirrhotic configuration. No suspicious mass. Note is made of a peripheral/subcapsular 0.3 x 2.6 cm hypoattenuating area in the right hepatic lobe at the junction of segments 6 and 7, which appears essentially unchanged since the prior study dating back to December 2018 and is demonstrated peripheral discontinuous hyperattenuating area the prior CT scan exam, favoring a hemangioma. There is a subcentimeter simple cyst in the inferior tip of right hepatic lobe. There is mild diffuse hepatic steatosis. No intrahepatic bile duct dilation. There is mild prominence of the extrahepatic bile duct, most likely due to post cholecystectomy status. Gallbladder is surgically absent. Pancreas: No focal mass. No peripancreatic fat stranding. There is mild dilation of main pancreatic duct, which appears more pronounced than the prior exam however, no obstructing mass seen. Spleen: Within normal limits. No focal lesion. Adrenals/Urinary Tract: Adrenal glands are unremarkable. No suspicious renal mass. There is a partially exophytic 3.0 x 3.8 cm cyst arising from the right kidney interpolar region, posteriorly. There are additional several subcentimeter foci throughout bilateral kidneys, which are too small to adequately characterize. There are at least 2 punctate nonobstructing calculi in the right kidney and a single punctate nonobstructing calculus in the left kidney. No ureterolithiasis. No hydronephrosis. Urinary bladder is under distended, precluding optimal assessment. However, no large mass or stones identified. No perivesical fat stranding. Stomach/Bowel: No disproportionate dilation of the small or large  bowel loops. No evidence of abnormal bowel wall thickening or inflammatory changes. The appendix is unremarkable. There is moderate stool burden. Vascular/Lymphatic: No ascites or pneumoperitoneum. No abdominal or pelvic lymphadenopathy, by size criteria. No aneurysmal dilation of the major abdominal arteries. Reproductive: Normal size prostate. Symmetric seminal vesicles. Other: The visualized soft tissues and abdominal wall are unremarkable. Musculoskeletal: No suspicious osseous lesions. There are mild - moderate multilevel degenerative changes in the visualized spine. There are moderate-to-severe degenerative changes in bilateral hip joints. IMPRESSION: 1. No acute inflammatory process within the abdomen or pelvis. No bowel obstruction. Normal appendix. Moderate stool burden. 2. Multiple other nonacute observations (such as hepatic steatosis, status post cholecystectomy, mildly dilated main pancreatic duct, punctate nonobstructing bilateral renal calculi, etc.), As described above. Electronically Signed   By: Jules Schick M.D.   On: 01/20/2023 09:27    ____________________________________________   PROCEDURES  Procedure(s) performed:   Procedures  None  ____________________________________________   INITIAL IMPRESSION / ASSESSMENT AND PLAN / ED COURSE  Pertinent labs & imaging results that were available during my care of the patient were reviewed by me and considered in my medical decision making (see chart for details).   This patient is Presenting for Evaluation of abdominal pain, which does require a range of treatment options, and is a  complaint that involves a high risk of morbidity and mortality.  The Differential Diagnoses includes but is not exclusive to acute cholecystitis, intrathoracic causes for epigastric abdominal pain, gastritis, duodenitis, pancreatitis, small bowel or large bowel obstruction, abdominal aortic aneurysm, hernia, gastritis, etc.   Critical Interventions-     Medications  sodium chloride 0.9 % bolus 500 mL (0 mLs Intravenous Stopped 01/20/23 1009)  iohexol (OMNIPAQUE) 300 MG/ML solution 100 mL (100 mLs Intravenous Contrast Given 01/20/23 0856)    Reassessment after intervention:  symptoms improved.   I decided to review pertinent External Data, and in summary I am unable to review recent colonoscopy report/procedure note.   Clinical Laboratory Tests Ordered, included CMP without AKI. Mild anemia. No leukocytosis. Lipase negative.   Radiologic Tests Ordered, included CT abdomen/pelvis. I independently interpreted the images and agree with radiology interpretation.   Cardiac Monitor Tracing which shows NSR.    Social Determinants of Health Risk patient is not an active smoker.   Medical Decision Making: Summary:  Patient presents emergency department for evaluation of constipation.  No subjective abdominal pain with mild tenderness on exam.  Plan for CT imaging with prior history of bowel obstruction and patient report of abnormal colonoscopy results recently. Overall, she looks very well with normal vital signs.  Reevaluation with update and discussion with patient. Discussed CT imaging. Plan for constipation mgmt and reassess.   Patient's presentation is most consistent with acute presentation with potential threat to life or bodily function.   Disposition: discharge  ____________________________________________  FINAL CLINICAL IMPRESSION(S) / ED DIAGNOSES  Final diagnoses:  Generalized abdominal pain  Constipation, unspecified constipation type    NEW OUTPATIENT MEDICATIONS STARTED DURING THIS VISIT:  Discharge Medication List as of 01/20/2023 10:04 AM     START taking these medications   Details  senna-docusate (SENOKOT-S) 8.6-50 MG tablet Take 1 tablet by mouth at bedtime as needed for mild constipation., Starting Mon 01/20/2023, Normal        Note:  This document was prepared using Dragon voice recognition software and may  include unintentional dictation errors.  Alona Bene, MD, [facility]    Kalinda Romaniello, [name], MD 01/20/23 [phone]

## 2023-01-21 ENCOUNTER — Other Ambulatory Visit (HOSPITAL_COMMUNITY): Payer: Self-pay

## 2023-01-22 LAB — MULTIPLE MYELOMA PANEL, SERUM
Albumin SerPl Elph-Mcnc: 3.8 g/dL (ref 2.9–4.4)
Albumin/Glob SerPl: 1.1 (ref 0.7–1.7)
Alpha 1: 0.2 g/dL (ref 0.0–0.4)
Alpha2 Glob SerPl Elph-Mcnc: 0.7 g/dL (ref 0.4–1.0)
B-Globulin SerPl Elph-Mcnc: 1 g/dL (ref 0.7–1.3)
Gamma Glob SerPl Elph-Mcnc: 1.6 g/dL (ref 0.4–1.8)
Globulin, Total: 3.6 g/dL (ref 2.2–3.9)
IgA: 21 mg/dL — ABNORMAL LOW (ref 61–437)
IgG (Immunoglobin G), Serum: 1894 mg/dL — ABNORMAL HIGH (ref 603–1613)
IgM (Immunoglobulin M), Srm: 19 mg/dL — ABNORMAL LOW (ref 20–172)
M Protein SerPl Elph-Mcnc: 1.3 g/dL — ABNORMAL HIGH
Total Protein ELP: 7.4 g/dL (ref 6.0–8.5)

## 2023-01-27 ENCOUNTER — Other Ambulatory Visit: Payer: Self-pay

## 2023-02-04 ENCOUNTER — Other Ambulatory Visit (HOSPITAL_COMMUNITY): Payer: Self-pay

## 2023-02-10 ENCOUNTER — Other Ambulatory Visit (HOSPITAL_COMMUNITY): Payer: Self-pay

## 2023-02-10 MED ORDER — OXYCODONE-ACETAMINOPHEN 10-325 MG PO TABS
1.0000 | ORAL_TABLET | Freq: Two times a day (BID) | ORAL | 0 refills | Status: DC | PRN
  Filled 2023-02-12: qty 60, 30d supply, fill #0

## 2023-02-11 ENCOUNTER — Other Ambulatory Visit (HOSPITAL_COMMUNITY): Payer: Self-pay

## 2023-02-12 ENCOUNTER — Other Ambulatory Visit (HOSPITAL_COMMUNITY): Payer: Self-pay

## 2023-02-14 ENCOUNTER — Ambulatory Visit: Payer: 59

## 2023-02-14 ENCOUNTER — Inpatient Hospital Stay: Payer: 59

## 2023-02-14 ENCOUNTER — Other Ambulatory Visit: Payer: 59

## 2023-02-14 ENCOUNTER — Inpatient Hospital Stay (HOSPITAL_BASED_OUTPATIENT_CLINIC_OR_DEPARTMENT_OTHER): Payer: 59 | Admitting: Physician Assistant

## 2023-02-14 ENCOUNTER — Inpatient Hospital Stay: Payer: 59 | Attending: Oncology

## 2023-02-14 ENCOUNTER — Ambulatory Visit: Payer: 59 | Admitting: Hematology

## 2023-02-14 ENCOUNTER — Other Ambulatory Visit: Payer: Self-pay

## 2023-02-14 VITALS — BP 164/78 | HR 62 | Temp 98.2°F | Resp 18

## 2023-02-14 DIAGNOSIS — Z79620 Long term (current) use of immunosuppressive biologic: Secondary | ICD-10-CM | POA: Diagnosis not present

## 2023-02-14 DIAGNOSIS — Z5112 Encounter for antineoplastic immunotherapy: Secondary | ICD-10-CM | POA: Diagnosis not present

## 2023-02-14 DIAGNOSIS — Z7969 Long term (current) use of other immunomodulators and immunosuppressants: Secondary | ICD-10-CM | POA: Diagnosis not present

## 2023-02-14 DIAGNOSIS — Z87891 Personal history of nicotine dependence: Secondary | ICD-10-CM | POA: Diagnosis not present

## 2023-02-14 DIAGNOSIS — Z79899 Other long term (current) drug therapy: Secondary | ICD-10-CM | POA: Diagnosis not present

## 2023-02-14 DIAGNOSIS — N200 Calculus of kidney: Secondary | ICD-10-CM | POA: Diagnosis not present

## 2023-02-14 DIAGNOSIS — Z8616 Personal history of COVID-19: Secondary | ICD-10-CM | POA: Insufficient documentation

## 2023-02-14 DIAGNOSIS — Z7952 Long term (current) use of systemic steroids: Secondary | ICD-10-CM | POA: Diagnosis not present

## 2023-02-14 DIAGNOSIS — C9002 Multiple myeloma in relapse: Secondary | ICD-10-CM | POA: Diagnosis not present

## 2023-02-14 DIAGNOSIS — M5137 Other intervertebral disc degeneration, lumbosacral region: Secondary | ICD-10-CM | POA: Insufficient documentation

## 2023-02-14 DIAGNOSIS — M545 Low back pain, unspecified: Secondary | ICD-10-CM | POA: Diagnosis not present

## 2023-02-14 DIAGNOSIS — D649 Anemia, unspecified: Secondary | ICD-10-CM | POA: Diagnosis not present

## 2023-02-14 DIAGNOSIS — G8929 Other chronic pain: Secondary | ICD-10-CM | POA: Diagnosis not present

## 2023-02-14 DIAGNOSIS — Z79624 Long term (current) use of inhibitors of nucleotide synthesis: Secondary | ICD-10-CM | POA: Insufficient documentation

## 2023-02-14 DIAGNOSIS — C9000 Multiple myeloma not having achieved remission: Secondary | ICD-10-CM | POA: Diagnosis not present

## 2023-02-14 DIAGNOSIS — Z8546 Personal history of malignant neoplasm of prostate: Secondary | ICD-10-CM | POA: Insufficient documentation

## 2023-02-14 DIAGNOSIS — Z9049 Acquired absence of other specified parts of digestive tract: Secondary | ICD-10-CM | POA: Insufficient documentation

## 2023-02-14 DIAGNOSIS — Z8042 Family history of malignant neoplasm of prostate: Secondary | ICD-10-CM | POA: Insufficient documentation

## 2023-02-14 DIAGNOSIS — C9001 Multiple myeloma in remission: Secondary | ICD-10-CM

## 2023-02-14 DIAGNOSIS — Z95828 Presence of other vascular implants and grafts: Secondary | ICD-10-CM

## 2023-02-14 DIAGNOSIS — Z886 Allergy status to analgesic agent status: Secondary | ICD-10-CM | POA: Insufficient documentation

## 2023-02-14 DIAGNOSIS — Z9079 Acquired absence of other genital organ(s): Secondary | ICD-10-CM | POA: Insufficient documentation

## 2023-02-14 DIAGNOSIS — Z85820 Personal history of malignant melanoma of skin: Secondary | ICD-10-CM | POA: Insufficient documentation

## 2023-02-14 DIAGNOSIS — Z8249 Family history of ischemic heart disease and other diseases of the circulatory system: Secondary | ICD-10-CM | POA: Diagnosis not present

## 2023-02-14 LAB — CBC WITH DIFFERENTIAL (CANCER CENTER ONLY)
Abs Immature Granulocytes: 0.01 10*3/uL (ref 0.00–0.07)
Basophils Absolute: 0 10*3/uL (ref 0.0–0.1)
Basophils Relative: 0 %
Eosinophils Absolute: 0 10*3/uL (ref 0.0–0.5)
Eosinophils Relative: 1 %
HCT: 34.3 % — ABNORMAL LOW (ref 39.0–52.0)
Hemoglobin: 12.2 g/dL — ABNORMAL LOW (ref 13.0–17.0)
Immature Granulocytes: 0 %
Lymphocytes Relative: 39 %
Lymphs Abs: 1.9 10*3/uL (ref 0.7–4.0)
MCH: 34.6 pg — ABNORMAL HIGH (ref 26.0–34.0)
MCHC: 35.6 g/dL (ref 30.0–36.0)
MCV: 97.2 fL (ref 80.0–100.0)
Monocytes Absolute: 0.3 10*3/uL (ref 0.1–1.0)
Monocytes Relative: 6 %
Neutro Abs: 2.5 10*3/uL (ref 1.7–7.7)
Neutrophils Relative %: 54 %
Platelet Count: 178 10*3/uL (ref 150–400)
RBC: 3.53 MIL/uL — ABNORMAL LOW (ref 4.22–5.81)
RDW: 12.6 % (ref 11.5–15.5)
WBC Count: 4.7 10*3/uL (ref 4.0–10.5)
nRBC: 0 % (ref 0.0–0.2)

## 2023-02-14 LAB — CMP (CANCER CENTER ONLY)
ALT: 80 U/L — ABNORMAL HIGH (ref 0–44)
AST: 52 U/L — ABNORMAL HIGH (ref 15–41)
Albumin: 4.2 g/dL (ref 3.5–5.0)
Alkaline Phosphatase: 117 U/L (ref 38–126)
Anion gap: 3 — ABNORMAL LOW (ref 5–15)
BUN: 18 mg/dL (ref 8–23)
CO2: 28 mmol/L (ref 22–32)
Calcium: 8.9 mg/dL (ref 8.9–10.3)
Chloride: 105 mmol/L (ref 98–111)
Creatinine: 1.03 mg/dL (ref 0.61–1.24)
GFR, Estimated: >60 mL/min (ref >60)
Glucose, Bld: 99 mg/dL (ref 70–99)
Potassium: 4.5 mmol/L (ref 3.5–5.1)
Sodium: 136 mmol/L (ref 135–145)
Total Bilirubin: 0.3 mg/dL (ref 0.3–1.2)
Total Protein: 7.3 g/dL (ref 6.5–8.1)

## 2023-02-14 MED ORDER — ACETAMINOPHEN 325 MG PO TABS
650.0000 mg | ORAL_TABLET | Freq: Once | ORAL | Status: AC
  Administered 2023-02-14: 650 mg via ORAL
  Filled 2023-02-14: qty 2

## 2023-02-14 MED ORDER — DIPHENHYDRAMINE HCL 25 MG PO CAPS
50.0000 mg | ORAL_CAPSULE | Freq: Once | ORAL | Status: AC
  Administered 2023-02-14: 50 mg via ORAL
  Filled 2023-02-14: qty 2

## 2023-02-14 MED ORDER — SODIUM CHLORIDE 0.9% FLUSH
10.0000 mL | INTRAVENOUS | Status: DC | PRN
  Administered 2023-02-14: 10 mL

## 2023-02-14 MED ORDER — DARATUMUMAB-HYALURONIDASE-FIHJ 1800-30000 MG-UT/15ML ~~LOC~~ SOLN
1800.0000 mg | Freq: Once | SUBCUTANEOUS | Status: AC
  Administered 2023-02-14: 1800 mg via SUBCUTANEOUS
  Filled 2023-02-14: qty 15

## 2023-02-14 MED ORDER — DEXAMETHASONE 4 MG PO TABS
8.0000 mg | ORAL_TABLET | Freq: Once | ORAL | Status: AC
  Administered 2023-02-14: 8 mg via ORAL
  Filled 2023-02-14: qty 2
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
## 2023-02-14 NOTE — Patient Instructions (Signed)
[facility]  Discharge Instructions: Thank you for choosing [facility] to provide your oncology and hematology care.   If you have a lab appointment with the Cancer Center, please go directly to the Cancer Center and check in at the registration area.   Wear comfortable clothing and clothing appropriate for easy access to any Portacath or PICC line.   We strive to give you quality time with your provider. You may need to reschedule your appointment if you arrive late (15 or more minutes).  Arriving late affects you and other patients whose appointments are after yours.  Also, if you miss three or more appointments without notifying the office, you may be dismissed from the clinic at the provider's discretion.      For prescription refill requests, have your pharmacy contact our office and allow 72 hours for refills to be completed.    Today you received the following chemotherapy and/or immunotherapy agents Darzalex Faspro      To help prevent nausea and vomiting after your treatment, we encourage you to take your nausea medication as directed.  BELOW ARE SYMPTOMS THAT SHOULD BE REPORTED IMMEDIATELY: *FEVER GREATER THAN 100.4 F (38 C) OR HIGHER *CHILLS OR SWEATING *NAUSEA AND VOMITING THAT IS NOT CONTROLLED WITH YOUR NAUSEA MEDICATION *UNUSUAL SHORTNESS OF BREATH *UNUSUAL BRUISING OR BLEEDING *URINARY PROBLEMS (pain or burning when urinating, or frequent urination) *BOWEL PROBLEMS (unusual diarrhea, constipation, pain near the anus) TENDERNESS IN MOUTH AND THROAT WITH OR WITHOUT PRESENCE OF ULCERS (sore throat, sores in mouth, or a toothache) UNUSUAL RASH, SWELLING OR PAIN  UNUSUAL VAGINAL DISCHARGE OR ITCHING   Items with * indicate a potential emergency and should be followed up as soon as possible or go to the Emergency Department if any problems should occur.  Please show the CHEMOTHERAPY ALERT CARD or IMMUNOTHERAPY ALERT CARD at  check-in to the Emergency Department and triage nurse.  Should you have questions after your visit or need to cancel or reschedule your appointment, please contact [facility]  Dept: [phone]  and follow the prompts.  Office hours are 8:00 a.m. to 4:30 p.m. Monday - Friday. Please note that voicemails left after 4:00 p.m. may not be returned until the following business day.  We are closed weekends and major holidays. You have access to a nurse at all times for urgent questions. Please call the main number to the clinic Dept: [phone] and follow the prompts.   For any non-urgent questions, you may also contact your provider using MyChart. We now offer e-Visits for anyone 18 and older to request care online for non-urgent symptoms. For details visit [email or web address].   Also download the MyChart app! Go to the app store, search "MyChart", open the app, select [facility], and log in with your MyChart username and password.  

## 2023-02-15 ENCOUNTER — Other Ambulatory Visit: Payer: Self-pay

## 2023-02-27 ENCOUNTER — Other Ambulatory Visit (HOSPITAL_COMMUNITY): Payer: Self-pay

## 2023-03-01 ENCOUNTER — Other Ambulatory Visit (HOSPITAL_COMMUNITY): Payer: Self-pay

## 2023-03-01 ENCOUNTER — Emergency Department (HOSPITAL_COMMUNITY): Payer: 59

## 2023-03-01 ENCOUNTER — Emergency Department (HOSPITAL_COMMUNITY)
Admission: EM | Admit: 2023-03-01 | Discharge: 2023-03-01 | Disposition: A | Discharge status: Home / Self Care | Payer: 59 | Attending: Emergency Medicine | Admitting: Emergency Medicine

## 2023-03-01 ENCOUNTER — Encounter (HOSPITAL_COMMUNITY): Payer: Self-pay

## 2023-03-01 DIAGNOSIS — D649 Anemia, unspecified: Secondary | ICD-10-CM | POA: Diagnosis not present

## 2023-03-01 DIAGNOSIS — I10 Essential (primary) hypertension: Secondary | ICD-10-CM | POA: Diagnosis not present

## 2023-03-01 DIAGNOSIS — M5459 Other low back pain: Secondary | ICD-10-CM | POA: Diagnosis not present

## 2023-03-01 DIAGNOSIS — G8929 Other chronic pain: Secondary | ICD-10-CM | POA: Diagnosis not present

## 2023-03-01 DIAGNOSIS — M5136 Other intervertebral disc degeneration, lumbar region: Secondary | ICD-10-CM | POA: Diagnosis not present

## 2023-03-01 DIAGNOSIS — M545 Low back pain, unspecified: Secondary | ICD-10-CM | POA: Insufficient documentation

## 2023-03-01 DIAGNOSIS — Z8616 Personal history of COVID-19: Secondary | ICD-10-CM | POA: Diagnosis not present

## 2023-03-01 LAB — COMPREHENSIVE METABOLIC PANEL
ALT: 23 U/L (ref 0–44)
AST: 22 U/L (ref 15–41)
Albumin: 4 g/dL (ref 3.5–5.0)
Alkaline Phosphatase: 73 U/L (ref 38–126)
Anion gap: 6 (ref 5–15)
BUN: 11 mg/dL (ref 8–23)
CO2: 25 mmol/L (ref 22–32)
Calcium: 8.9 mg/dL (ref 8.9–10.3)
Chloride: 104 mmol/L (ref 98–111)
Creatinine, Ser: 0.83 mg/dL (ref 0.61–1.24)
GFR, Estimated: >60 mL/min (ref >60)
Glucose, Bld: 87 mg/dL (ref 70–99)
Potassium: 4.1 mmol/L (ref 3.5–5.1)
Sodium: 135 mmol/L (ref 135–145)
Total Bilirubin: 0.6 mg/dL (ref 0.3–1.2)
Total Protein: 7.6 g/dL (ref 6.5–8.1)

## 2023-03-01 LAB — CBC WITH DIFFERENTIAL/PLATELET
Abs Immature Granulocytes: 0.01 10*3/uL (ref 0.00–0.07)
Basophils Absolute: 0 10*3/uL (ref 0.0–0.1)
Basophils Relative: 0 %
Eosinophils Absolute: 0 10*3/uL (ref 0.0–0.5)
Eosinophils Relative: 1 %
HCT: 38.5 % — ABNORMAL LOW (ref 39.0–52.0)
Hemoglobin: 12.6 g/dL — ABNORMAL LOW (ref 13.0–17.0)
Immature Granulocytes: 0 %
Lymphocytes Relative: 40 %
Lymphs Abs: 1.6 10*3/uL (ref 0.7–4.0)
MCH: 32.6 pg (ref 26.0–34.0)
MCHC: 32.7 g/dL (ref 30.0–36.0)
MCV: 99.7 fL (ref 80.0–100.0)
Monocytes Absolute: 0.3 10*3/uL (ref 0.1–1.0)
Monocytes Relative: 6 %
Neutro Abs: 2.1 10*3/uL (ref 1.7–7.7)
Neutrophils Relative %: 53 %
Platelets: 151 10*3/uL (ref 150–400)
RBC: 3.86 MIL/uL — ABNORMAL LOW (ref 4.22–5.81)
RDW: 12.8 % (ref 11.5–15.5)
WBC: 4 10*3/uL (ref 4.0–10.5)
nRBC: 0 % (ref 0.0–0.2)

## 2023-03-01 MED ORDER — METHOCARBAMOL 500 MG PO TABS
500.0000 mg | ORAL_TABLET | Freq: Three times a day (TID) | ORAL | 0 refills | Status: DC | PRN
  Filled 2023-03-01: qty 20, 7d supply, fill #0

## 2023-03-01 MED ORDER — ONDANSETRON HCL 4 MG/2ML IJ SOLN
4.0000 mg | Freq: Once | INTRAMUSCULAR | Status: AC
  Administered 2023-03-01: 4 mg via INTRAVENOUS
  Filled 2023-03-01: qty 2

## 2023-03-01 MED ORDER — MORPHINE SULFATE (PF) 4 MG/ML IV SOLN
4.0000 mg | Freq: Once | INTRAVENOUS | Status: AC
  Administered 2023-03-01: 4 mg via INTRAVENOUS
  Filled 2023-03-01: qty 1

## 2023-03-01 NOTE — Discharge Instructions (Signed)
You have been seen in the Emergency Department (ED)  today for back pain.  Your workup and exam have not shown any acute abnormalities and you are likely suffering from muscle strain or possible problems with your discs, but there is no treatment that will fix your symptoms at this time.    Please follow up with your doctor as soon as possible regarding today's ED visit and your back pain.  Return to the ED for worsening back pain, fever, weakness or numbness of either leg, or if you develop either (1) an inability to urinate or have bowel movements, or (2) loss of your ability to control your bathroom functions (if you start having "accidents"), or if you develop other new symptoms that concern you.  

## 2023-03-01 NOTE — ED Triage Notes (Signed)
Pt arrived via POV, c/o chronic lower back pain. 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 Currently re consulting with PCP and oncologist. States pain is too much at home, requesting pain medication.

## 2023-03-01 NOTE — ED Provider Notes (Signed)
Emergency Department Provider Note   I have reviewed the triage vital signs and the nursing notes.   HISTORY  Chief Complaint Back Pain   HPI Travis Nguyen is a 72 y.o. male with past medical history reviewed below including multiple myeloma and chronic lower back pain presents to the emergency department for evaluation of worsening back pain.  Patient states he has been on chronic Percocet 10/325 for many years.  He tells me he was referred to pain management for evaluation but they could only offer him spine injections.  He states because of his cancer he was concerned about this and so did not complete that treatment.  He denies any numbness or weakness in the legs.  No groin numbness.  No bowel or bladder incontinence.  No urinary retention.  He feels like his current pain management is not working because has been on it for very Meeya Goldin time.    Past Medical History:  Diagnosis Date   Anemia    hx of    Arthritis    DJD lower back   Back pain    COVID-19    Gall stones    hx of   Heart murmur    asymptomatic    Hypertension    Melanoma (HCC)    does not have melanoma!!! (per pt)   Multiple myeloma (HCC) dx'd 10/2009   chemo   Pneumonia    x1   Prostate cancer (HCC) 11/2012   gleason 3+4=7, volume 24 gm   Sinus problem     Review of Systems  Constitutional: No fever/chills Cardiovascular: Denies chest pain. Respiratory: Denies shortness of breath. Gastrointestinal: No abdominal pain.  No nausea, no vomiting.  No diarrhea.  No constipation. Genitourinary: Negative for dysuria. Musculoskeletal: Positive for back pain. Skin: Negative for rash. Neurological: Negative for headaches, focal weakness or numbness.   ____________________________________________   PHYSICAL EXAM:  VITAL SIGNS: ED Triage Vitals [03/01/23 0729]  Encounter Vitals Group     BP (!) 153/75     Pulse Rate 64     Resp 18     Temp 98.2 F (36.8 C)     Temp Source Oral     SpO2  100 %     Weight 152 lb (68.9 kg)     Height 6\' 1"  (1.854 m)   Constitutional: Alert and oriented. Well appearing and in no acute distress. Eyes: Conjunctivae are normal. Head: Atraumatic. Nose: No congestion/rhinnorhea. Mouth/Throat: Mucous membranes are moist.   Neck: No stridor.   Cardiovascular: Normal rate, regular rhythm. Good peripheral circulation. Grossly normal heart sounds.   Respiratory: Normal respiratory effort.  No retractions. Lungs CTAB. Gastrointestinal: Soft and nontender. No distention.  Musculoskeletal: No lower extremity tenderness nor edema. No gross deformities of extremities. Neurologic:  Normal speech and language. No gross focal neurologic deficits are appreciated. Normal strength and sensation in the bilateral LEs.  Skin:  Skin is warm, dry and intact. No rash noted.   ____________________________________________   LABS (all labs ordered are listed, but only abnormal results are displayed)  Labs Reviewed  CBC WITH DIFFERENTIAL/PLATELET - Abnormal; Notable for the following components:      Result Value   RBC 3.86 (*)    Hemoglobin 12.6 (*)    HCT 38.5 (*)    All other components within normal limits  COMPREHENSIVE METABOLIC PANEL   ____________________________________________  RADIOLOGY  CT Lumbar Spine Wo Contrast  Result Date: 03/01/2023 CLINICAL DATA:  Low back pain with increased  fracture risk EXAM: CT LUMBAR SPINE WITHOUT CONTRAST TECHNIQUE: Multidetector CT imaging of the lumbar spine was performed without intravenous contrast administration. Multiplanar CT image reconstructions were also generated. RADIATION DOSE REDUCTION: This exam was performed according to the departmental dose-optimization program which includes automated exposure control, adjustment of the mA and/or kV according to patient size and/or use of iterative reconstruction technique. COMPARISON:  06/30/2015 lumbar myelogram FINDINGS: Segmentation: 5 lumbar type vertebrae.  Alignment: Normal. Vertebrae: No acute fracture or focal pathologic process. Paraspinal and other soft tissues: Innumerable bilateral renal calculi Disc levels: Mild generalized disc bulging with degenerative disc space narrowing, focally advanced at L5-S1. Up to mild facet spurring inferiorly. Prior right laminotomy at L4-5. IMPRESSION: No acute finding. Electronically Signed   By: Tiburcio Pea M.D.   On: 03/01/2023 11:19    ____________________________________________   PROCEDURES  Procedure(s) performed:   Procedures  None  ____________________________________________   INITIAL IMPRESSION / ASSESSMENT AND PLAN / ED COURSE  Pertinent labs & imaging results that were available during my care of the patient were reviewed by me and considered in my medical decision making (see chart for details).   This patient is Presenting for Evaluation of back pain, which does require a range of treatment options, and is a complaint that involves a high risk of morbidity and mortality.  The Differential Diagnoses includes but is not exclusive to musculoskeletal back pain, renal colic, urinary tract infection, pyelonephritis, intra-abdominal causes of back pain, aortic aneurysm or dissection, cauda equina syndrome, sciatica, lumbar disc disease, thoracic disc disease, etc.   Critical Interventions-    Medications  morphine (PF) 4 MG/ML injection 4 mg (4 mg Intravenous Given 03/01/23 0949)  ondansetron (ZOFRAN) injection 4 mg (4 mg Intravenous Given 03/01/23 0949)    Reassessment after intervention: pain improved.    Clinical Laboratory Tests Ordered, included CBC without leukocytosis.  Mild anemia at 12.6.  CMP without acute kidney injury or abnormal LFTs.  Radiologic Tests Ordered, included CT lumbar spine. I independently interpreted the images and agree with radiology interpretation.   Cardiac Monitor Tracing which shows NSR.    Social Determinants of Health Risk patient is a  non-smoker.   Medical Decision Making: Summary:  Patient presents to the emergency department for evaluation of worsening chronic back pain.  He does have a history of multiple myeloma.  With worsening pain I do feel that spine imaging is indicated but no red flag signs or symptoms to prompt emergent MRI.  Plan to start with CT imaging of the lumbar spine and reassess.  Will obtain basic labs given the patient's current/ongoing cancer treatment for multiple myeloma.  Reevaluation with update and discussion with patient. CT without acute findings. He is feeling improved and ambulatory here in the ED. Plan for d/c. Will send Rx for Robaxin and give contact info for [facility].    Patient's presentation is most consistent with acute presentation with potential threat to life or bodily function.   Disposition: discharge  ____________________________________________  FINAL CLINICAL IMPRESSION(S) / ED DIAGNOSES  Final diagnoses:  Chronic midline low back pain without sciatica     NEW OUTPATIENT MEDICATIONS STARTED DURING THIS VISIT:  Discharge Medication List as of 03/01/2023 12:10 PM     START taking these medications   Details  methocarbamol (ROBAXIN) 500 MG tablet Take 1 tablet (500 mg total) by mouth every 8 (eight) hours as needed for muscle spasms., Starting Sat 03/01/2023, Normal        Note:  This  document was prepared using Conservation officer, historic buildings and may include unintentional dictation errors.  Alona Bene, MD, [facility] Emergency [facility]    Marija Calamari, [name], MD 03/01/23 [id number]

## 2023-03-03 ENCOUNTER — Encounter: Payer: Self-pay | Admitting: Podiatry

## 2023-03-03 ENCOUNTER — Ambulatory Visit (INDEPENDENT_AMBULATORY_CARE_PROVIDER_SITE_OTHER): Payer: 59 | Admitting: Podiatry

## 2023-03-03 DIAGNOSIS — M79674 Pain in right toe(s): Secondary | ICD-10-CM | POA: Diagnosis not present

## 2023-03-03 DIAGNOSIS — M79675 Pain in left toe(s): Secondary | ICD-10-CM | POA: Diagnosis not present

## 2023-03-03 DIAGNOSIS — L989 Disorder of the skin and subcutaneous tissue, unspecified: Secondary | ICD-10-CM

## 2023-03-03 DIAGNOSIS — B351 Tinea unguium: Secondary | ICD-10-CM | POA: Diagnosis not present

## 2023-03-03 DIAGNOSIS — M2041 Other hammer toe(s) (acquired), right foot: Secondary | ICD-10-CM

## 2023-03-03 NOTE — Progress Notes (Signed)
This patient presents to the office with three complaints.  He presents to the office for nail care and callus care on his left big toe.  He is also concerned about pain in his fifth toe right foot.  Patient has not been seen in over 6 months.  He has history of DVT right leg.  He also has had surgery on his big toe joint right foot.   He presents to the office for nail and callus care.  He also expects surgery on his fifth toe right foot.  General Appearance  Alert, conversant and in no acute stress.  Vascular  Dorsalis pedis and posterior tibial  pulses are palpable  bilaterally.  Capillary return is within normal limits  bilaterally. Temperature is within normal limits  bilaterally.  Neurologic  Senn-Weinstein monofilament wire test within normal limits  bilaterally. Muscle power within normal limits bilaterally.  Nails Thick disfigured discolored nails with subungual debris  from hallux to fifth toes bilaterally. No evidence of bacterial infection or drainage bilaterally.  Orthopedic  No limitations of motion  feet .  No crepitus or effusions noted.  No bony pathology or digital deformities noted. Hallux rigidis 1st MPJ right foot.  Skin  normotropic skin with no porokeratosis noted bilaterally.  No signs of infections or ulcers noted.   Pinch callus left hallux.  Onychomycosis  B/L.  Pinch callus left hallux.  Hammer toe fifth toe right foot.  Debride nails with nail nipper and dremel tool.  Debride pinch callus left hallux with dremel tool.  Discussed his fifth toe and told him to follow up with Dr.  Allena Katz.  Marland Kitchengam

## 2023-03-04 ENCOUNTER — Other Ambulatory Visit: Payer: Self-pay

## 2023-03-04 ENCOUNTER — Other Ambulatory Visit (HOSPITAL_COMMUNITY): Payer: Self-pay

## 2023-03-04 MED ORDER — OXYCODONE-ACETAMINOPHEN 10-325 MG PO TABS
1.0000 | ORAL_TABLET | Freq: Two times a day (BID) | ORAL | 0 refills | Status: DC
  Filled 2023-04-10: qty 60, 30d supply, fill #0

## 2023-03-11 ENCOUNTER — Inpatient Hospital Stay: Payer: 59

## 2023-03-11 ENCOUNTER — Inpatient Hospital Stay: Payer: 59 | Admitting: Physician Assistant

## 2023-03-11 ENCOUNTER — Other Ambulatory Visit: Payer: Self-pay

## 2023-03-11 VITALS — BP 169/87

## 2023-03-11 VITALS — BP 187/90 | HR 58 | Temp 98.1°F | Resp 17 | Wt 150.2 lb

## 2023-03-11 DIAGNOSIS — Z7969 Long term (current) use of other immunomodulators and immunosuppressants: Secondary | ICD-10-CM | POA: Diagnosis not present

## 2023-03-11 DIAGNOSIS — M545 Low back pain, unspecified: Secondary | ICD-10-CM | POA: Diagnosis not present

## 2023-03-11 DIAGNOSIS — D649 Anemia, unspecified: Secondary | ICD-10-CM | POA: Diagnosis not present

## 2023-03-11 DIAGNOSIS — C9001 Multiple myeloma in remission: Secondary | ICD-10-CM

## 2023-03-11 DIAGNOSIS — Z8616 Personal history of COVID-19: Secondary | ICD-10-CM | POA: Diagnosis not present

## 2023-03-11 DIAGNOSIS — N200 Calculus of kidney: Secondary | ICD-10-CM | POA: Diagnosis not present

## 2023-03-11 DIAGNOSIS — Z87891 Personal history of nicotine dependence: Secondary | ICD-10-CM | POA: Diagnosis not present

## 2023-03-11 DIAGNOSIS — G8929 Other chronic pain: Secondary | ICD-10-CM

## 2023-03-11 DIAGNOSIS — Z79624 Long term (current) use of inhibitors of nucleotide synthesis: Secondary | ICD-10-CM | POA: Diagnosis not present

## 2023-03-11 DIAGNOSIS — Z95828 Presence of other vascular implants and grafts: Secondary | ICD-10-CM

## 2023-03-11 DIAGNOSIS — Z79620 Long term (current) use of immunosuppressive biologic: Secondary | ICD-10-CM | POA: Diagnosis not present

## 2023-03-11 DIAGNOSIS — Z886 Allergy status to analgesic agent status: Secondary | ICD-10-CM | POA: Diagnosis not present

## 2023-03-11 DIAGNOSIS — Z5112 Encounter for antineoplastic immunotherapy: Secondary | ICD-10-CM | POA: Diagnosis not present

## 2023-03-11 DIAGNOSIS — Z79899 Other long term (current) drug therapy: Secondary | ICD-10-CM | POA: Diagnosis not present

## 2023-03-11 DIAGNOSIS — Z9049 Acquired absence of other specified parts of digestive tract: Secondary | ICD-10-CM | POA: Diagnosis not present

## 2023-03-11 DIAGNOSIS — Z85820 Personal history of malignant melanoma of skin: Secondary | ICD-10-CM | POA: Diagnosis not present

## 2023-03-11 DIAGNOSIS — Z8249 Family history of ischemic heart disease and other diseases of the circulatory system: Secondary | ICD-10-CM | POA: Diagnosis not present

## 2023-03-11 DIAGNOSIS — M5137 Other intervertebral disc degeneration, lumbosacral region: Secondary | ICD-10-CM | POA: Diagnosis not present

## 2023-03-11 DIAGNOSIS — Z7952 Long term (current) use of systemic steroids: Secondary | ICD-10-CM | POA: Diagnosis not present

## 2023-03-11 DIAGNOSIS — C9000 Multiple myeloma not having achieved remission: Secondary | ICD-10-CM | POA: Diagnosis not present

## 2023-03-11 DIAGNOSIS — C9002 Multiple myeloma in relapse: Secondary | ICD-10-CM

## 2023-03-11 LAB — CBC WITH DIFFERENTIAL (CANCER CENTER ONLY)
Abs Immature Granulocytes: 0.01 10*3/uL (ref 0.00–0.07)
Basophils Absolute: 0 10*3/uL (ref 0.0–0.1)
Basophils Relative: 0 %
Eosinophils Absolute: 0 10*3/uL (ref 0.0–0.5)
Eosinophils Relative: 1 %
HCT: 34.5 % — ABNORMAL LOW (ref 39.0–52.0)
Hemoglobin: 11.7 g/dL — ABNORMAL LOW (ref 13.0–17.0)
Immature Granulocytes: 0 %
Lymphocytes Relative: 50 %
Lymphs Abs: 2.3 10*3/uL (ref 0.7–4.0)
MCH: 32.7 pg (ref 26.0–34.0)
MCHC: 33.9 g/dL (ref 30.0–36.0)
MCV: 96.4 fL (ref 80.0–100.0)
Monocytes Absolute: 0.3 10*3/uL (ref 0.1–1.0)
Monocytes Relative: 7 %
Neutro Abs: 1.9 10*3/uL (ref 1.7–7.7)
Neutrophils Relative %: 42 %
Platelet Count: 170 10*3/uL (ref 150–400)
RBC: 3.58 MIL/uL — ABNORMAL LOW (ref 4.22–5.81)
RDW: 12.4 % (ref 11.5–15.5)
WBC Count: 4.6 10*3/uL (ref 4.0–10.5)
nRBC: 0 % (ref 0.0–0.2)

## 2023-03-11 LAB — CMP (CANCER CENTER ONLY)
ALT: 10 U/L (ref 0–44)
AST: 14 U/L — ABNORMAL LOW (ref 15–41)
Albumin: 4.2 g/dL (ref 3.5–5.0)
Alkaline Phosphatase: 75 U/L (ref 38–126)
Anion gap: 5 (ref 5–15)
BUN: 15 mg/dL (ref 8–23)
CO2: 25 mmol/L (ref 22–32)
Calcium: 9 mg/dL (ref 8.9–10.3)
Chloride: 104 mmol/L (ref 98–111)
Creatinine: 0.85 mg/dL (ref 0.61–1.24)
GFR, Estimated: >60 mL/min (ref >60)
Glucose, Bld: 98 mg/dL (ref 70–99)
Potassium: 4 mmol/L (ref 3.5–5.1)
Sodium: 134 mmol/L — ABNORMAL LOW (ref 135–145)
Total Bilirubin: 0.4 mg/dL (ref 0.3–1.2)
Total Protein: 7.4 g/dL (ref 6.5–8.1)

## 2023-03-11 MED ORDER — SODIUM CHLORIDE 0.9% FLUSH
10.0000 mL | INTRAVENOUS | Status: DC | PRN
  Administered 2023-03-11: 10 mL

## 2023-03-11 MED ORDER — ACETAMINOPHEN 325 MG PO TABS
650.0000 mg | ORAL_TABLET | Freq: Once | ORAL | Status: AC
  Administered 2023-03-11: 650 mg via ORAL
  Filled 2023-03-11: qty 2

## 2023-03-11 MED ORDER — DIPHENHYDRAMINE HCL 25 MG PO CAPS
50.0000 mg | ORAL_CAPSULE | Freq: Once | ORAL | Status: AC
  Administered 2023-03-11: 50 mg via ORAL
  Filled 2023-03-11: qty 2

## 2023-03-11 MED ORDER — DARATUMUMAB-HYALURONIDASE-FIHJ 1800-30000 MG-UT/15ML ~~LOC~~ SOLN
1800.0000 mg | Freq: Once | SUBCUTANEOUS | Status: AC
  Administered 2023-03-11: 1800 mg via SUBCUTANEOUS
  Filled 2023-03-11: qty 15

## 2023-03-11 MED ORDER — DEXAMETHASONE 4 MG PO TABS
8.0000 mg | ORAL_TABLET | Freq: Once | ORAL | Status: AC
  Administered 2023-03-11: 8 mg via ORAL
  Filled 2023-03-11: qty 2
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
## 2023-03-11 NOTE — Patient Instructions (Signed)
[facility]  Discharge Instructions: Thank you for choosing [facility] to provide your oncology and hematology care.   If you have a lab appointment with the Cancer Center, please go directly to the Cancer Center and check in at the registration area.   Wear comfortable clothing and clothing appropriate for easy access to any Portacath or PICC line.   We strive to give you quality time with your provider. You may need to reschedule your appointment if you arrive late (15 or more minutes).  Arriving late affects you and other patients whose appointments are after yours.  Also, if you miss three or more appointments without notifying the office, you may be dismissed from the clinic at the provider's discretion.      For prescription refill requests, have your pharmacy contact our office and allow 72 hours for refills to be completed.    Today you received the following chemotherapy and/or immunotherapy agents darzalex      To help prevent nausea and vomiting after your treatment, we encourage you to take your nausea medication as directed.  BELOW ARE SYMPTOMS THAT SHOULD BE REPORTED IMMEDIATELY: *FEVER GREATER THAN 100.4 F (38 C) OR HIGHER *CHILLS OR SWEATING *NAUSEA AND VOMITING THAT IS NOT CONTROLLED WITH YOUR NAUSEA MEDICATION *UNUSUAL SHORTNESS OF BREATH *UNUSUAL BRUISING OR BLEEDING *URINARY PROBLEMS (pain or burning when urinating, or frequent urination) *BOWEL PROBLEMS (unusual diarrhea, constipation, pain near the anus) TENDERNESS IN MOUTH AND THROAT WITH OR WITHOUT PRESENCE OF ULCERS (sore throat, sores in mouth, or a toothache) UNUSUAL RASH, SWELLING OR PAIN  UNUSUAL VAGINAL DISCHARGE OR ITCHING   Items with * indicate a potential emergency and should be followed up as soon as possible or go to the Emergency Department if any problems should occur.  Please show the CHEMOTHERAPY ALERT CARD or IMMUNOTHERAPY ALERT CARD at  check-in to the Emergency Department and triage nurse.  Should you have questions after your visit or need to cancel or reschedule your appointment, please contact [facility]  Dept: [phone]  and follow the prompts.  Office hours are 8:00 a.m. to 4:30 p.m. Monday - Friday. Please note that voicemails left after 4:00 p.m. may not be returned until the following business day.  We are closed weekends and major holidays. You have access to a nurse at all times for urgent questions. Please call the main number to the clinic Dept: [phone] and follow the prompts.   For any non-urgent questions, you may also contact your provider using MyChart. We now offer e-Visits for anyone 18 and older to request care online for non-urgent symptoms. For details visit [email or web address].   Also download the MyChart app! Go to the app store, search "MyChart", open the app, select [facility], and log in with your MyChart username and password.   

## 2023-03-11 NOTE — Addendum Note (Signed)
Addended by: Caryl Comes, Arzell Mcgeehan A on: 03/11/2023 10:29 AM   Modules accepted: Orders

## 2023-03-12 ENCOUNTER — Ambulatory Visit (INDEPENDENT_AMBULATORY_CARE_PROVIDER_SITE_OTHER): Payer: 59 | Admitting: Podiatry

## 2023-03-12 DIAGNOSIS — L989 Disorder of the skin and subcutaneous tissue, unspecified: Secondary | ICD-10-CM

## 2023-03-12 DIAGNOSIS — Z01818 Encounter for other preprocedural examination: Secondary | ICD-10-CM

## 2023-03-12 NOTE — Progress Notes (Signed)
Subjective:  Patient ID: Travis Nguyen, male    DOB: 20-Aug-1950,  MRN: [id number]  Chief Complaint  Patient presents with   Toe Pain    72 y.o. male presents with the above complaint.  Patient presents with right fourth digit distal tip porokeratotic lesion.  Patient states is painful to touch is progressive and worsens with ambulation or shoe pressure she has examined debrided with Dr. Stacie Acres but has failed all conservative care he would like to discuss surgical options at this time.  Pain scale 7 out of 10 dull achy in nature.   Review of Systems: Negative except as noted in the HPI. Denies N/V/F/Ch.  Past Medical History:  Diagnosis Date   Anemia    hx of    Arthritis    DJD lower back   Back pain    COVID-19    Gall stones    hx of   Heart murmur    asymptomatic    Hypertension    Melanoma (HCC)    does not have melanoma!!! (per pt)   Multiple myeloma (HCC) dx'd 10/2009   chemo   Pneumonia    x1   Prostate cancer (HCC) 11/2012   gleason 3+4=7, volume 24 gm   Sinus problem     Current Outpatient Medications:    acyclovir (ZOVIRAX) 400 MG tablet, Take 1 tablet (400 mg total) by mouth 2 (two) times daily. (Patient not taking: Reported on 02/14/2023), Disp: 60 tablet, Rfl: 3   amLODipine (NORVASC) 10 MG tablet, Take 1 tablet by mouth once a day (Patient not taking: Reported on 08/30/2022), Disp: 90 tablet, Rfl: 1   amLODipine (NORVASC) 5 MG tablet, Take 1 tablet (5 mg total) by mouth daily. (Patient not taking: Reported on 08/30/2022), Disp: 90 tablet, Rfl: 1   amLODipine (NORVASC) 5 MG tablet, Take 1 tablet (5 mg total) by mouth daily., Disp: 90 tablet, Rfl: 1   bisacodyl 5 MG EC tablet, Take by mouth as directed. (Patient not taking: Reported on 02/14/2023), Disp: 4 tablet, Rfl: 0   Cyanocobalamin (VITAMIN B12 PO), Take by mouth daily., Disp: , Rfl:    cyclobenzaprine (FLEXERIL) 5 MG tablet, Take 1 - 2 tablets by mouth at bedtime as needed (Patient not taking: Reported on  10/09/2022), Disp: 60 tablet, Rfl: 0   escitalopram (LEXAPRO) 10 MG tablet, Take 1 tablet by mouth once a day (Patient not taking: Reported on 08/30/2022), Disp: 90 tablet, Rfl: 1   escitalopram (LEXAPRO) 10 MG tablet, Take 1 tablet by mouth once daily (Patient not taking: Reported on 08/30/2022), Disp: 90 tablet, Rfl: 1   escitalopram (LEXAPRO) 10 MG tablet, Take 1 tablet (10 mg total) by mouth daily. (Patient not taking: Reported on 08/30/2022), Disp: 90 tablet, Rfl: 1   escitalopram (LEXAPRO) 10 MG tablet, Take 1 tablet (10 mg total) by mouth daily. (Patient not taking: Reported on 02/14/2023), Disp: 90 tablet, Rfl: 1   HYDROmorphone (DILAUDID) 4 MG tablet, Take 1 tablet (4 mg) by mouth every 6 hours as needed for severe pain. (Patient not taking: Reported on 02/14/2023), Disp: 20 tablet, Rfl: 0   lidocaine-prilocaine (EMLA) cream, APPLY TOPICALLY TO PORT-A-CATH DAILY AS NEEDED (Patient not taking: Reported on 02/14/2023), Disp: 30 g, Rfl: 2   lidocaine-prilocaine (EMLA) cream, Apply 1 Application topically once. Prior to port access, Disp: , Rfl:    losartan (COZAAR) 100 MG tablet, TAKE 1 TABLET BY MOUTH ONCE A DAY, Disp: 90 tablet, Rfl: 0   losartan (COZAAR) 100 MG  tablet, Take 1 tablet by mouth once a day (Patient not taking: Reported on 08/30/2022), Disp: 90 tablet, Rfl: 1   losartan (COZAAR) 100 MG tablet, Take 1 tablet by mouth daily (Patient not taking: Reported on 08/30/2022), Disp: 90 tablet, Rfl: 1   losartan (COZAAR) 100 MG tablet, Take 1 tablet (100 mg total) by mouth daily. (Patient not taking: Reported on 02/14/2023), Disp: 90 tablet, Rfl: 1   losartan (COZAAR) 100 MG tablet, Take 1 tablet (100 mg total) by mouth daily. (Patient not taking: Reported on 08/30/2022), Disp: 90 tablet, Rfl: 1   methocarbamol (ROBAXIN) 500 MG tablet, Take 1 tablet (500 mg) by mouth every 8 hours as needed for muscle spasms. (Patient not taking: Reported on 03/11/2023), Disp: 20 tablet, Rfl: 0   oxyCODONE-acetaminophen  (PERCOCET) 10-325 MG tablet, Take 1 tablet by mouth 2 times a day (Patient not taking: Reported on 08/30/2022), Disp: 60 tablet, Rfl: 0   oxyCODONE-acetaminophen (PERCOCET) 10-325 MG tablet, Take 1 tablet by mouth twice daily as needed (07/05/21) (Patient not taking: Reported on 02/14/2023), Disp: 60 tablet, Rfl: 0   oxyCODONE-acetaminophen (PERCOCET) 10-325 MG tablet, Take 1 tablet by mouth 2 (two) times daily as needed. (Patient not taking: Reported on 08/30/2022), Disp: 60 tablet, Rfl: 0   oxyCODONE-acetaminophen (PERCOCET) 10-325 MG tablet, Take 1 tablet by mouth 2 times a day as needed orally (ok to fill 30 days after last refill) (Patient not taking: Reported on 08/30/2022), Disp: 60 tablet, Rfl: 0   oxyCODONE-acetaminophen (PERCOCET) 10-325 MG tablet, Take 1 tablet by mouth 2 (two) times daily as needed. (Patient not taking: Reported on 08/30/2022), Disp: 60 tablet, Rfl: 0   oxyCODONE-acetaminophen (PERCOCET) 10-325 MG tablet, Take 1 tablet by mouth 2 (two) times daily. okay to fill 30 days after last refill (Patient not taking: Reported on 08/30/2022), Disp: 60 tablet, Rfl: 0   oxyCODONE-acetaminophen (PERCOCET) 10-325 MG tablet, Take 1 tablet by mouth 2 (two) times daily as needed. (ok to fill 30 days after last refill) (Patient not taking: Reported on 08/30/2022), Disp: 60 tablet, Rfl: 0   oxyCODONE-acetaminophen (PERCOCET) 10-325 MG tablet, Take 1 tablet by mouth 2 (two) times daily as needed (Patient not taking: Reported on 10/09/2022), Disp: 60 tablet, Rfl: 0   oxyCODONE-acetaminophen (PERCOCET) 10-325 MG tablet, Take 1 tablet by mouth 2 (two) times daily as needed. (Patient not taking: Reported on 02/14/2023), Disp: 60 tablet, Rfl: 0   oxyCODONE-acetaminophen (PERCOCET) 10-325 MG tablet, Take 1 tablet by mouth 2 times daily. (Patient not taking: Reported on 02/14/2023), Disp: 60 tablet, Rfl: 0   oxyCODONE-acetaminophen (PERCOCET) 10-325 MG tablet, Take 1 tablet by mouth 2 (two) times daily as needed.  (Patient not taking: Reported on 03/11/2023), Disp: 60 tablet, Rfl: 0   oxyCODONE-acetaminophen (PERCOCET) 10-325 MG tablet, Take 1 tablet by mouth 2 (two) times daily as needed., Disp: 60 tablet, Rfl: 0   pantoprazole (PROTONIX) 20 MG tablet, Take 1 tablet (20 mg total) by mouth daily. (Patient not taking: Reported on 08/30/2022), Disp: 14 tablet, Rfl: 0   polyethylene glycol-electrolytes (NULYTELY) 420 g solution, Use as directed. (Patient not taking: Reported on 02/14/2023), Disp: 4000 mL, Rfl: 0   predniSONE (DELTASONE) 10 MG tablet, Take 2 tablets (20 mg) by mouth daily. (Patient not taking: Reported on 02/14/2023), Disp: 15 tablet, Rfl: 0   senna-docusate (SENOKOT-S) 8.6-50 MG tablet, Take 1 tablet by mouth at bedtime as needed for mild constipation. (Patient not taking: Reported on 02/14/2023), Disp: 20 tablet, Rfl: 0   trimethoprim-polymyxin  b (POLYTRIM) ophthalmic solution, Place 1 drop into both eyes every 4 (four) hours. (Patient not taking: Reported on 10/09/2022), Disp: 10 mL, Rfl: 0   vitamin C (ASCORBIC ACID) 500 MG tablet, Take 500 mg by mouth daily., Disp: , Rfl:   Social History   Tobacco Use  Smoking Status Former   Current packs/day: 0.00   Average packs/day: 2.0 packs/day for 5.0 years (10.0 ttl pk-yrs)   Types: Cigarettes   Start date: 08/02/1971   Quit date: 08/01/1976   Years since quitting: 46.6  Smokeless Tobacco Never    Allergies  Allergen Reactions   Aspirin     Pt stated "raises BP"    Objective:  There were no vitals filed for this visit. There is no height or weight on file to calculate BMI. Constitutional Well developed. Well nourished.  Vascular Dorsalis pedis pulses palpable bilaterally. Posterior tibial pulses palpable bilaterally. Capillary refill normal to all digits.  No cyanosis or clubbing noted. Pedal hair growth normal.  Neurologic Normal speech. Oriented to person, place, and time. Epicritic sensation to light touch grossly present bilaterally.   Dermatologic Right fourth digit distal tip hyperkeratotic lesion with central nucleated core.  Pain on palpation to the lesion.  Orthopedic: Normal joint ROM without pain or crepitus bilaterally. No visible deformities. No bony tenderness.   Radiographs: None Assessment:   1. Benign skin lesion   2. Encounter for preoperative examination for general surgical procedure    Plan:  Patient was evaluated and treated and all questions answered.  Right fourth digit benign skin lesion distal tip -All questions and concerns were discussed with the patient in extensive detail given the amount of pain that is having a benefit from surgical excision of the lesion.  I discussed with the patient extensive detail I discussed my preoperative intra postop plan with the patient he states understand like to proceed with surgery. --Informed surgical risk consent was reviewed and read aloud to the patient.  I reviewed the films.  I have discussed my findings with the patient in great detail.  I have discussed all risks including but not limited to infection, stiffness, scarring, limp, disability, deformity, damage to blood vessels and nerves, numbness, poor healing, need for braces, arthritis, chronic pain, amputation, death.  All benefits and realistic expectations discussed in great detail.  I have made no promises as to the outcome.  I have provided realistic expectations.  I have offered the patient a 2nd opinion, which they have declined and assured me they preferred to proceed despite the risks   No follow-ups on file.

## 2023-03-13 ENCOUNTER — Other Ambulatory Visit (HOSPITAL_COMMUNITY): Payer: Self-pay

## 2023-03-13 DIAGNOSIS — Z9989 Dependence on other enabling machines and devices: Secondary | ICD-10-CM | POA: Diagnosis not present

## 2023-03-13 DIAGNOSIS — E7800 Pure hypercholesterolemia, unspecified: Secondary | ICD-10-CM | POA: Diagnosis not present

## 2023-03-13 DIAGNOSIS — I10 Essential (primary) hypertension: Secondary | ICD-10-CM | POA: Diagnosis not present

## 2023-03-13 DIAGNOSIS — M79674 Pain in right toe(s): Secondary | ICD-10-CM | POA: Diagnosis not present

## 2023-03-13 DIAGNOSIS — C9000 Multiple myeloma not having achieved remission: Secondary | ICD-10-CM | POA: Diagnosis not present

## 2023-03-13 DIAGNOSIS — D84821 Immunodeficiency due to drugs: Secondary | ICD-10-CM | POA: Diagnosis not present

## 2023-03-13 DIAGNOSIS — I714 Abdominal aortic aneurysm, without rupture, unspecified: Secondary | ICD-10-CM | POA: Diagnosis not present

## 2023-03-13 MED ORDER — OXYCODONE-ACETAMINOPHEN 10-325 MG PO TABS
1.0000 | ORAL_TABLET | Freq: Two times a day (BID) | ORAL | 0 refills | Status: DC
  Filled 2023-03-13: qty 60, 30d supply, fill #0

## 2023-03-13 MED ORDER — LOSARTAN POTASSIUM 100 MG PO TABS
100.0000 mg | ORAL_TABLET | Freq: Every day | ORAL | 1 refills | Status: DC
  Filled 2023-03-13: qty 90, 90d supply, fill #0
  Filled 2023-06-28: qty 90, 90d supply, fill #1

## 2023-03-13 MED ORDER — AMLODIPINE BESYLATE 5 MG PO TABS
5.0000 mg | ORAL_TABLET | Freq: Every day | ORAL | 1 refills | Status: DC
  Filled 2023-03-13: qty 90, 90d supply, fill #0
  Filled 2023-06-28: qty 90, 90d supply, fill #1

## 2023-03-14 ENCOUNTER — Ambulatory Visit: Payer: 59 | Admitting: Physician Assistant

## 2023-03-14 ENCOUNTER — Inpatient Hospital Stay: Payer: 59

## 2023-03-14 ENCOUNTER — Ambulatory Visit: Payer: 59

## 2023-03-14 ENCOUNTER — Telehealth: Payer: Self-pay | Admitting: Urology

## 2023-03-14 ENCOUNTER — Other Ambulatory Visit: Payer: 59

## 2023-03-14 ENCOUNTER — Other Ambulatory Visit: Payer: Self-pay

## 2023-03-14 NOTE — Telephone Encounter (Signed)
DOS - 04/14/23  EXC. BENIGN LESION RIGHT --- 11422  [employer] EFFECTIVE DATE - 07/15/22  PER [employer] WEBSITE FOR CPT CODE 62952 Notification or Prior Authorization is not required for the requested services You are not required to submit a notification/prior authorization based on the information provided. If you have general questions about the prior authorization requirements, visit [email or web address] > Clinician Resources > Advance and Admission Notification Requirements. The number above acknowledges your notification. Please write this reference number down for future reference. If you would like to request an organization determination, please call us at [phone].   Decision ID #: [id number]

## 2023-03-17 LAB — MULTIPLE MYELOMA PANEL, SERUM
Albumin SerPl Elph-Mcnc: 3.7 g/dL (ref 2.9–4.4)
Albumin/Glob SerPl: 1.1 (ref 0.7–1.7)
Alpha 1: 0.2 g/dL (ref 0.0–0.4)
Alpha2 Glob SerPl Elph-Mcnc: 0.6 g/dL (ref 0.4–1.0)
B-Globulin SerPl Elph-Mcnc: 0.9 g/dL (ref 0.7–1.3)
Gamma Glob SerPl Elph-Mcnc: 1.6 g/dL (ref 0.4–1.8)
Globulin, Total: 3.4 g/dL (ref 2.2–3.9)
IgA: 18 mg/dL — ABNORMAL LOW (ref 61–437)
IgG (Immunoglobin G), Serum: 1825 mg/dL — ABNORMAL HIGH (ref 603–1613)
IgM (Immunoglobulin M), Srm: 24 mg/dL (ref 20–172)
M Protein SerPl Elph-Mcnc: 1.3 g/dL — ABNORMAL HIGH
Total Protein ELP: 7.1 g/dL (ref 6.0–8.5)

## 2023-03-22 ENCOUNTER — Other Ambulatory Visit: Payer: Self-pay

## 2023-03-24 ENCOUNTER — Telehealth: Payer: Self-pay | Admitting: Podiatry

## 2023-03-24 NOTE — Telephone Encounter (Signed)
Pt is here in the office stating that he has decided not to do the surgery that is scheduled on 04/14/2023 due to him moving.  Pt would like to have a call.

## 2023-03-24 NOTE — Telephone Encounter (Signed)
Patient came to office and advised check in staff that he no longer wanted to have surgery on 09/30 due to the fact that he is moving. Attempted to call patient to confirm cancellation. No answer, no voicemail box.

## 2023-03-29 ENCOUNTER — Other Ambulatory Visit: Payer: Self-pay

## 2023-03-31 ENCOUNTER — Other Ambulatory Visit (HOSPITAL_COMMUNITY): Payer: Self-pay

## 2023-03-31 ENCOUNTER — Emergency Department (HOSPITAL_COMMUNITY)
Admission: EM | Admit: 2023-03-31 | Discharge: 2023-03-31 | Disposition: A | Discharge status: Home / Self Care | Payer: 59 | Attending: Emergency Medicine | Admitting: Emergency Medicine

## 2023-03-31 ENCOUNTER — Other Ambulatory Visit: Payer: Self-pay

## 2023-03-31 ENCOUNTER — Encounter (HOSPITAL_COMMUNITY): Payer: Self-pay

## 2023-03-31 DIAGNOSIS — G8929 Other chronic pain: Secondary | ICD-10-CM | POA: Insufficient documentation

## 2023-03-31 DIAGNOSIS — M549 Dorsalgia, unspecified: Secondary | ICD-10-CM | POA: Diagnosis present

## 2023-03-31 DIAGNOSIS — M5459 Other low back pain: Secondary | ICD-10-CM | POA: Diagnosis not present

## 2023-03-31 DIAGNOSIS — M545 Low back pain, unspecified: Secondary | ICD-10-CM | POA: Insufficient documentation

## 2023-03-31 MED ORDER — DIAZEPAM 5 MG PO TABS
5.0000 mg | ORAL_TABLET | Freq: Once | ORAL | Status: AC
  Administered 2023-03-31: 5 mg via ORAL
  Filled 2023-03-31: qty 1

## 2023-03-31 MED ORDER — DIAZEPAM 5 MG PO TABS
5.0000 mg | ORAL_TABLET | Freq: Two times a day (BID) | ORAL | 0 refills | Status: DC | PRN
  Filled 2023-03-31: qty 6, 3d supply, fill #0

## 2023-03-31 NOTE — Discharge Instructions (Signed)
He was seen in the emergency room for back pain Your back pain improved slightly after 1 dose of medication called Valium which is a muscle relaxant can help with muscle spasms We have called in a short prescription for you to take this medication at home if it helps with muscle spasms As a reminder do not drink alcohol or drive while taking this medication Follow-up with your primary care doctor to discuss chronic pain management Return to the emergency department for severe pain or any other concerns

## 2023-03-31 NOTE — ED Provider Notes (Signed)
[place] [facility]GENCY DEPARTMENT AT [facility] Provider Note   CSN: [id number] Arrival date & time: 03/31/23  0815     History  Chief Complaint  Patient presents with   Back Pain    Travis Nguyen is a 72 y.o. male.  With a past medical history of chronic back pain, multiple myeloma and degenerative disc disease who presents to the ED for back pain.  He reports ongoing back pain for "years".  He takes Percocet at home for this reason but states this medication has been less and less effective in providing adequate analgesia.  He has talked to his doctor about referral to a pain specialist but has not yet seen a specialist.  He is currently undergoing chemotherapy for multiple myeloma.  Today's episode of back pain is similar to prior episodes.  No trauma or recent falls.  He is able to ambulate without issue.  No fecal/urinary incontinence, fevers, chills or focal motor weakness or sensory deficits.   Back Pain      Home Medications Prior to Admission medications   Medication Sig Start Date End Date Taking? Authorizing Provider  acyclovir (ZOVIRAX) 400 MG tablet Take 1 tablet (400 mg total) by mouth 2 (two) times daily. Patient not taking: Reported on 02/14/2023 07/01/22   Benjiman Core, MD  amLODipine (NORVASC) 10 MG tablet Take 1 tablet by mouth once a day Patient not taking: Reported on 08/30/2022 04/24/21     amLODipine (NORVASC) 5 MG tablet Take 1 tablet (5 mg total) by mouth daily. Patient not taking: Reported on 08/30/2022 02/25/22     amLODipine (NORVASC) 5 MG tablet Take 1 tablet (5 mg total) by mouth daily. 08/12/22     amLODipine (NORVASC) 5 MG tablet Take 1 tablet (5 mg total) by mouth daily. 03/13/23     bisacodyl 5 MG EC tablet Take by mouth as directed. Patient not taking: Reported on 02/14/2023 10/17/22   [name], MD  Cyanocobalamin (VITAMIN B12 PO) Take by mouth daily.    [provider]  cyclobenzaprine (FLEXERIL) 5 MG tablet Take 1 - 2  tablets by mouth at bedtime as needed Patient not taking: Reported on 10/09/2022 05/20/22     diazepam (VALIUM) 5 MG tablet Take 1 tablet (5 mg total) by mouth every 12 (twelve) hours as needed for muscle spasms. 03/31/23  Yes [name], DO  escitalopram (LEXAPRO) 10 MG tablet Take 1 tablet by mouth once a day Patient not taking: Reported on 08/30/2022 04/24/21     escitalopram (LEXAPRO) 10 MG tablet Take 1 tablet by mouth once daily Patient not taking: Reported on 08/30/2022 07/24/21     escitalopram (LEXAPRO) 10 MG tablet Take 1 tablet (10 mg total) by mouth daily. Patient not taking: Reported on 08/30/2022 02/25/22     escitalopram (LEXAPRO) 10 MG tablet Take 1 tablet (10 mg total) by mouth daily. Patient not taking: Reported on 02/14/2023 08/12/22     HYDROmorphone (DILAUDID) 4 MG tablet Take 1 tablet (4 mg) by mouth every 6 hours as needed for severe pain. Patient not taking: Reported on 02/14/2023 01/06/23   [name], MD  lidocaine-prilocaine (EMLA) cream APPLY TOPICALLY TO PORT-A-CATH DAILY AS NEEDED Patient not taking: Reported on 02/14/2023 06/28/22 06/28/23  Benjiman Core, MD  lidocaine-prilocaine (EMLA) cream Apply 1 Application topically once. Prior to port access    [provider]  losartan (COZAAR) 100 MG tablet TAKE 1 TABLET BY MOUTH ONCE A DAY 06/06/20 06/06/21  [name], MD  losartan (COZAAR) 100 MG tablet Take 1 tablet by mouth once a day Patient not taking: Reported on 08/30/2022 04/24/21     losartan (COZAAR) 100 MG tablet Take 1 tablet by mouth daily Patient not taking: Reported on 08/30/2022 07/24/21     losartan (COZAAR) 100 MG tablet Take 1 tablet (100 mg total) by mouth daily. Patient not taking: Reported on 02/14/2023 02/25/22     losartan (COZAAR) 100 MG tablet Take 1 tablet (100 mg total) by mouth daily. Patient not taking: Reported on 08/30/2022 08/12/22     losartan (COZAAR) 100 MG tablet Take 1 tablet (100 mg total) by mouth daily. 03/13/23      methocarbamol (ROBAXIN) 500 MG tablet Take 1 tablet (500 mg) by mouth every 8 hours as needed for muscle spasms. Patient not taking: Reported on 03/11/2023 03/01/23   [name], [name], MD  oxyCODONE-acetaminophen (PERCOCET) 10-325 MG tablet Take 1 tablet by mouth 2 times a day Patient not taking: Reported on 08/30/2022 12/15/20     oxyCODONE-acetaminophen (PERCOCET) 10-325 MG tablet Take 1 tablet by mouth twice daily as needed (07/05/21) Patient not taking: Reported on 02/14/2023 07/02/21     oxyCODONE-acetaminophen (PERCOCET) 10-325 MG tablet Take 1 tablet by mouth 2 (two) times daily as needed. Patient not taking: Reported on 08/30/2022 08/03/21     oxyCODONE-acetaminophen (PERCOCET) 10-325 MG tablet Take 1 tablet by mouth 2 times a day as needed orally (ok to fill 30 days after last refill) Patient not taking: Reported on 08/30/2022 02/25/22     oxyCODONE-acetaminophen (PERCOCET) 10-325 MG tablet Take 1 tablet by mouth 2 (two) times daily as needed. Patient not taking: Reported on 08/30/2022 04/29/22     oxyCODONE-acetaminophen (PERCOCET) 10-325 MG tablet Take 1 tablet by mouth 2 (two) times daily. okay to fill 30 days after last refill Patient not taking: Reported on 08/30/2022 05/22/22     oxyCODONE-acetaminophen (PERCOCET) 10-325 MG tablet Take 1 tablet by mouth 2 (two) times daily as needed. (ok to fill 30 days after last refill) Patient not taking: Reported on 08/30/2022 06/27/22     oxyCODONE-acetaminophen (PERCOCET) 10-325 MG tablet Take 1 tablet by mouth 2 (two) times daily as needed Patient not taking: Reported on 10/09/2022 09/23/22     oxyCODONE-acetaminophen (PERCOCET) 10-325 MG tablet Take 1 tablet by mouth 2 (two) times daily as needed. Patient not taking: Reported on 02/14/2023 11/19/22     oxyCODONE-acetaminophen (PERCOCET) 10-325 MG tablet Take 1 tablet by mouth 2 times daily. Patient not taking: Reported on 02/14/2023 01/14/23     oxyCODONE-acetaminophen (PERCOCET) 10-325 MG tablet Take 1 tablet by  mouth 2 (two) times daily as needed. Patient not taking: Reported on 03/11/2023 02/10/23     oxyCODONE-acetaminophen (PERCOCET) 10-325 MG tablet Take 1 tablet by mouth 2 (two) times daily as needed. 03/04/23     oxyCODONE-acetaminophen (PERCOCET) 10-325 MG tablet Take 1 tablet by mouth 2 (two) times daily. 03/13/23     pantoprazole (PROTONIX) 20 MG tablet Take 1 tablet (20 mg total) by mouth daily. Patient not taking: Reported on 08/30/2022 01/07/21   Tilden Fossa, MD  polyethylene glycol-electrolytes (NULYTELY) 420 g solution Use as directed. Patient not taking: Reported on 02/14/2023 10/17/22   [name], MD  predniSONE (DELTASONE) 10 MG tablet Take 2 tablets (20 mg) by mouth daily. Patient not taking: Reported on 02/14/2023 01/06/23   [name], MD  senna-docusate (SENOKOT-S) 8.6-50 MG tablet Take 1 tablet by mouth at bedtime as needed for mild  constipation. Patient not taking: Reported on 02/14/2023 01/20/23   [name], [name], MD  trimethoprim-polymyxin b (POLYTRIM) ophthalmic solution Place 1 drop into both eyes every 4 (four) hours. Patient not taking: Reported on 10/09/2022 09/28/22   Ernie Avena, MD  vitamin C (ASCORBIC ACID) 500 MG tablet Take 500 mg by mouth daily.    [provider]      Allergies    Aspirin    Review of Systems   Review of Systems  Musculoskeletal:  Positive for back pain.  All other systems reviewed and are negative.   Physical Exam Updated Vital Signs BP (!) 151/69 (BP Location: Left Arm)   Pulse 67   Temp 98.3 F (36.8 C) (Oral)   Resp 17   Ht 6\' 1"  (1.854 m)   Wt 68 kg   SpO2 100%   BMI 19.79 kg/m  Physical Exam Vitals and nursing note reviewed.  HENT:     Head: Normocephalic and atraumatic.  Eyes:     Pupils: Pupils are equal, round, and reactive to light.  Cardiovascular:     Rate and Rhythm: Normal rate and regular rhythm.  Pulmonary:     Effort: Pulmonary effort is normal.     Breath sounds: Normal breath sounds.  Abdominal:      Palpations: Abdomen is soft.     Tenderness: There is no abdominal tenderness.  Musculoskeletal:     Comments: Midline and paraspinal bilateral lumbar tenderness without step-off or deformity 5 out of 5 motor strength bilateral lower extremities Ambulates with steady gait Sensation tact light touch throughout bilateral lower extremities  Skin:    General: Skin is warm and dry.  Neurological:     Mental Status: He is alert.  Psychiatric:        Mood and Affect: Mood normal.     ED Results / Procedures / Treatments   Labs (all labs ordered are listed, but only abnormal results are displayed) Labs Reviewed - No data to display  EKG None  Radiology No results found.  Procedures Procedures    Medications Ordered in ED Medications  diazepam (VALIUM) tablet 5 mg (5 mg Oral Given 03/31/23 1022)    ED Course/ Medical Decision Making/ A&P Clinical Course as of 03/31/23 1155  Mon Mar 31, 2023  1152 Patient reports improvement in his back pain after 1 dose of oral Valium here.  Will give him a few pills to go home with and call this into his pharmacy but otherwise he needs to follow-up with his primary care doctor for management of his chronic back pain.  Which he plans to do stable for discharge [MP]    Clinical Course User Index [MP] [name], DO                                 Medical Decision Making 72 year old male with history as above presenting for persistent chronic back pain.  States he was seen in the emergency department recently and was given a muscle relaxer which helped with his pain.  He does have a prescription for Percocet which she takes at home but is not provided adequate relief.  No acute injuries or other red flag symptoms that merit advanced imaging at this time.  CT from less than a month ago shows degenerative joint disease.  Given well overall appearance no need for laboratory workup at this time.  We will medicate with Valium  and reassess.   Ultimately patient may need to see a pain specialist for chronic back pain  Risk Prescription drug management.           Final Clinical Impression(s) / ED Diagnoses Final diagnoses:  Chronic low back pain, unspecified back pain laterality, unspecified whether sciatica present    Rx / DC Orders ED Discharge Orders          Ordered    diazepam (VALIUM) 5 MG tablet  Every 12 hours PRN        03/31/23 1154              Estelle June A, DO 03/31/23 1155

## 2023-03-31 NOTE — ED Triage Notes (Signed)
Pt coming in for his chonic back pain related to his degenerative disk. Denies any events, or new injuries. Pt states he has HX of bone cancer as well.

## 2023-04-01 ENCOUNTER — Other Ambulatory Visit: Payer: Self-pay

## 2023-04-01 ENCOUNTER — Emergency Department (HOSPITAL_COMMUNITY)
Admission: EM | Admit: 2023-04-01 | Discharge: 2023-04-01 | Disposition: A | Discharge status: Home / Self Care | Payer: 59 | Attending: Emergency Medicine | Admitting: Emergency Medicine

## 2023-04-01 ENCOUNTER — Emergency Department (HOSPITAL_COMMUNITY): Payer: 59

## 2023-04-01 DIAGNOSIS — Z743 Need for continuous supervision: Secondary | ICD-10-CM | POA: Diagnosis not present

## 2023-04-01 DIAGNOSIS — M545 Low back pain, unspecified: Secondary | ICD-10-CM | POA: Diagnosis not present

## 2023-04-01 DIAGNOSIS — M160 Bilateral primary osteoarthritis of hip: Secondary | ICD-10-CM | POA: Diagnosis not present

## 2023-04-01 DIAGNOSIS — M5459 Other low back pain: Secondary | ICD-10-CM | POA: Diagnosis not present

## 2023-04-01 DIAGNOSIS — D72819 Decreased white blood cell count, unspecified: Secondary | ICD-10-CM | POA: Diagnosis not present

## 2023-04-01 DIAGNOSIS — R6889 Other general symptoms and signs: Secondary | ICD-10-CM | POA: Diagnosis not present

## 2023-04-01 DIAGNOSIS — G8929 Other chronic pain: Secondary | ICD-10-CM | POA: Insufficient documentation

## 2023-04-01 DIAGNOSIS — M47816 Spondylosis without myelopathy or radiculopathy, lumbar region: Secondary | ICD-10-CM | POA: Diagnosis not present

## 2023-04-01 DIAGNOSIS — Z79899 Other long term (current) drug therapy: Secondary | ICD-10-CM | POA: Diagnosis not present

## 2023-04-01 DIAGNOSIS — D649 Anemia, unspecified: Secondary | ICD-10-CM | POA: Diagnosis not present

## 2023-04-01 DIAGNOSIS — I10 Essential (primary) hypertension: Secondary | ICD-10-CM | POA: Diagnosis not present

## 2023-04-01 DIAGNOSIS — Z8546 Personal history of malignant neoplasm of prostate: Secondary | ICD-10-CM | POA: Diagnosis not present

## 2023-04-01 DIAGNOSIS — M549 Dorsalgia, unspecified: Secondary | ICD-10-CM | POA: Diagnosis not present

## 2023-04-01 LAB — BASIC METABOLIC PANEL
Anion gap: 10 (ref 5–15)
BUN: 10 mg/dL (ref 8–23)
CO2: 24 mmol/L (ref 22–32)
Calcium: 9.4 mg/dL (ref 8.9–10.3)
Chloride: 104 mmol/L (ref 98–111)
Creatinine, Ser: 0.89 mg/dL (ref 0.61–1.24)
GFR, Estimated: >60 mL/min (ref >60)
Glucose, Bld: 82 mg/dL (ref 70–99)
Potassium: 4.4 mmol/L (ref 3.5–5.1)
Sodium: 138 mmol/L (ref 135–145)

## 2023-04-01 LAB — CBC WITH DIFFERENTIAL/PLATELET
Abs Immature Granulocytes: 0.01 10*3/uL (ref 0.00–0.07)
Basophils Absolute: 0 10*3/uL (ref 0.0–0.1)
Basophils Relative: 1 %
Eosinophils Absolute: 0 10*3/uL (ref 0.0–0.5)
Eosinophils Relative: 1 %
HCT: 38 % — ABNORMAL LOW (ref 39.0–52.0)
Hemoglobin: 12.5 g/dL — ABNORMAL LOW (ref 13.0–17.0)
Immature Granulocytes: 0 %
Lymphocytes Relative: 50 %
Lymphs Abs: 1.8 10*3/uL (ref 0.7–4.0)
MCH: 32.1 pg (ref 26.0–34.0)
MCHC: 32.9 g/dL (ref 30.0–36.0)
MCV: 97.7 fL (ref 80.0–100.0)
Monocytes Absolute: 0.3 10*3/uL (ref 0.1–1.0)
Monocytes Relative: 8 %
Neutro Abs: 1.5 10*3/uL — ABNORMAL LOW (ref 1.7–7.7)
Neutrophils Relative %: 40 %
Platelets: 155 10*3/uL (ref 150–400)
RBC: 3.89 MIL/uL — ABNORMAL LOW (ref 4.22–5.81)
RDW: 12.2 % (ref 11.5–15.5)
WBC: 3.6 10*3/uL — ABNORMAL LOW (ref 4.0–10.5)
nRBC: 0 % (ref 0.0–0.2)

## 2023-04-01 MED ORDER — METHOCARBAMOL 500 MG PO TABS
500.0000 mg | ORAL_TABLET | Freq: Once | ORAL | Status: AC
  Administered 2023-04-01: 500 mg via ORAL
  Filled 2023-04-01: qty 1

## 2023-04-01 MED ORDER — HYDROMORPHONE HCL 1 MG/ML IJ SOLN: 1.0000 mg | Freq: Once | INTRAMUSCULAR | Status: DC

## 2023-04-01 MED ORDER — MORPHINE SULFATE (PF) 4 MG/ML IV SOLN
4.0000 mg | Freq: Once | INTRAVENOUS | Status: AC
  Administered 2023-04-01: 4 mg via INTRAVENOUS
  Filled 2023-04-01: qty 1

## 2023-04-01 MED ORDER — METHOCARBAMOL 500 MG PO TABS
500.0000 mg | ORAL_TABLET | Freq: Two times a day (BID) | ORAL | 0 refills | Status: DC
Start: 2023-04-01 — End: 2023-12-12
  Filled 2023-04-01 – 2023-04-02 (×2): qty 20, 10d supply, fill #0

## 2023-04-01 MED ORDER — LIDOCAINE 5 % EX PTCH
1.0000 | MEDICATED_PATCH | CUTANEOUS | Status: DC
  Administered 2023-04-01: 1 via TRANSDERMAL
  Filled 2023-04-01: qty 1

## 2023-04-01 NOTE — ED Provider Notes (Signed)
[place] [facility] AT [facility] Provider Note   CSN: [id number] Arrival date & time: 04/01/23  1139     History  Chief Complaint  Patient presents with   Back Pain    Travis Nguyen is a 72 y.o. male with a past medical history significant for prostate cancer, multiple myeloma, history of small bowel obstruction, hypertension, chronic low back pain, history of DVT who presents to the ED due to acute on chronic low back pain.  Notes he has been dealing with chronic back pain for the past 15 years.  On chronic Percocet.  Notes it does not work for his pain anymore.  Patient was seen in the ED yesterday for the same complaint where he was discharged with Valium.  He notes he has been taking Valium and Tylenol with no relief in pain.  Denies saddle anesthesia, bowel/bladder incontinence, lower extremity numbness/tingling, lower extremity weakness.  No fever or chills.  Denies IV drug use.  No recent injury.  Patient had a CT scan of his lumbar spine 1 month ago which was negative for any acute findings.  Patient states he was told he needed surgery again on his low back however, does not feel it helped his pain previously so he is reluctant to have another surgery. No abdominal pain.   History obtained from patient and past medical records. No interpreter used during encounter.       Home Medications Prior to Admission medications   Medication Sig Start Date End Date Taking? Authorizing Provider  methocarbamol (ROBAXIN) 500 MG tablet Take 1 tablet (500 mg total) by mouth 2 (two) times daily. 04/01/23  Yes Kiante Petrovich, Merla Riches, PA-C  acyclovir (ZOVIRAX) 400 MG tablet Take 1 tablet (400 mg total) by mouth 2 (two) times daily. Patient not taking: Reported on 02/14/2023 07/01/22   Benjiman Core, MD  amLODipine (NORVASC) 10 MG tablet Take 1 tablet by mouth once a day Patient not taking: Reported on 08/30/2022 04/24/21     amLODipine (NORVASC) 5 MG tablet Take 1 tablet  (5 mg total) by mouth daily. Patient not taking: Reported on 08/30/2022 02/25/22     amLODipine (NORVASC) 5 MG tablet Take 1 tablet (5 mg total) by mouth daily. 08/12/22     amLODipine (NORVASC) 5 MG tablet Take 1 tablet (5 mg total) by mouth daily. 03/13/23     bisacodyl 5 MG EC tablet Take by mouth as directed. Patient not taking: Reported on 02/14/2023 10/17/22   [name], MD  Cyanocobalamin (VITAMIN B12 PO) Take by mouth daily.    [provider]  cyclobenzaprine (FLEXERIL) 5 MG tablet Take 1 - 2 tablets by mouth at bedtime as needed Patient not taking: Reported on 10/09/2022 05/20/22     diazepam (VALIUM) 5 MG tablet Take 1 tablet (5 mg total) by mouth every 12 (twelve) hours as needed for muscle spasms. 03/31/23   [name], DO  escitalopram (LEXAPRO) 10 MG tablet Take 1 tablet by mouth once a day Patient not taking: Reported on 08/30/2022 04/24/21     escitalopram (LEXAPRO) 10 MG tablet Take 1 tablet by mouth once daily Patient not taking: Reported on 08/30/2022 07/24/21     escitalopram (LEXAPRO) 10 MG tablet Take 1 tablet (10 mg total) by mouth daily. Patient not taking: Reported on 08/30/2022 02/25/22     escitalopram (LEXAPRO) 10 MG tablet Take 1 tablet (10 mg total) by mouth daily. Patient not taking: Reported on 02/14/2023 08/12/22  HYDROmorphone (DILAUDID) 4 MG tablet Take 1 tablet (4 mg) by mouth every 6 hours as needed for severe pain. Patient not taking: Reported on 02/14/2023 01/06/23   [name], MD  lidocaine-prilocaine (EMLA) cream APPLY TOPICALLY TO PORT-A-CATH DAILY AS NEEDED Patient not taking: Reported on 02/14/2023 06/28/22 06/28/23  Benjiman Core, MD  lidocaine-prilocaine (EMLA) cream Apply 1 Application topically once. Prior to port access    [provider]  losartan (COZAAR) 100 MG tablet TAKE 1 TABLET BY MOUTH ONCE A DAY 06/06/20 06/06/21  [name], MD  losartan (COZAAR) 100 MG tablet Take 1 tablet by mouth once a day Patient not taking:  Reported on 08/30/2022 04/24/21     losartan (COZAAR) 100 MG tablet Take 1 tablet by mouth daily Patient not taking: Reported on 08/30/2022 07/24/21     losartan (COZAAR) 100 MG tablet Take 1 tablet (100 mg total) by mouth daily. Patient not taking: Reported on 02/14/2023 02/25/22     losartan (COZAAR) 100 MG tablet Take 1 tablet (100 mg total) by mouth daily. Patient not taking: Reported on 08/30/2022 08/12/22     losartan (COZAAR) 100 MG tablet Take 1 tablet (100 mg total) by mouth daily. 03/13/23     methocarbamol (ROBAXIN) 500 MG tablet Take 1 tablet (500 mg) by mouth every 8 hours as needed for muscle spasms. Patient not taking: Reported on 03/11/2023 03/01/23   Long, [name], MD  oxyCODONE-acetaminophen (PERCOCET) 10-325 MG tablet Take 1 tablet by mouth 2 times a day Patient not taking: Reported on 08/30/2022 12/15/20     oxyCODONE-acetaminophen (PERCOCET) 10-325 MG tablet Take 1 tablet by mouth twice daily as needed (07/05/21) Patient not taking: Reported on 02/14/2023 07/02/21     oxyCODONE-acetaminophen (PERCOCET) 10-325 MG tablet Take 1 tablet by mouth 2 (two) times daily as needed. Patient not taking: Reported on 08/30/2022 08/03/21     oxyCODONE-acetaminophen (PERCOCET) 10-325 MG tablet Take 1 tablet by mouth 2 times a day as needed orally (ok to fill 30 days after last refill) Patient not taking: Reported on 08/30/2022 02/25/22     oxyCODONE-acetaminophen (PERCOCET) 10-325 MG tablet Take 1 tablet by mouth 2 (two) times daily as needed. Patient not taking: Reported on 08/30/2022 04/29/22     oxyCODONE-acetaminophen (PERCOCET) 10-325 MG tablet Take 1 tablet by mouth 2 (two) times daily. okay to fill 30 days after last refill Patient not taking: Reported on 08/30/2022 05/22/22     oxyCODONE-acetaminophen (PERCOCET) 10-325 MG tablet Take 1 tablet by mouth 2 (two) times daily as needed. (ok to fill 30 days after last refill) Patient not taking: Reported on 08/30/2022 06/27/22     oxyCODONE-acetaminophen  (PERCOCET) 10-325 MG tablet Take 1 tablet by mouth 2 (two) times daily as needed Patient not taking: Reported on 10/09/2022 09/23/22     oxyCODONE-acetaminophen (PERCOCET) 10-325 MG tablet Take 1 tablet by mouth 2 (two) times daily as needed. Patient not taking: Reported on 02/14/2023 11/19/22     oxyCODONE-acetaminophen (PERCOCET) 10-325 MG tablet Take 1 tablet by mouth 2 times daily. Patient not taking: Reported on 02/14/2023 01/14/23     oxyCODONE-acetaminophen (PERCOCET) 10-325 MG tablet Take 1 tablet by mouth 2 (two) times daily as needed. Patient not taking: Reported on 03/11/2023 02/10/23     oxyCODONE-acetaminophen (PERCOCET) 10-325 MG tablet Take 1 tablet by mouth 2 (two) times daily as needed. 03/04/23     oxyCODONE-acetaminophen (PERCOCET) 10-325 MG tablet Take 1 tablet by mouth 2 (two) times daily. 03/13/23  pantoprazole (PROTONIX) 20 MG tablet Take 1 tablet (20 mg total) by mouth daily. Patient not taking: Reported on 08/30/2022 01/07/21   Tilden Fossa, MD  polyethylene glycol-electrolytes (NULYTELY) 420 g solution Use as directed. Patient not taking: Reported on 02/14/2023 10/17/22   [name], MD  predniSONE (DELTASONE) 10 MG tablet Take 2 tablets (20 mg) by mouth daily. Patient not taking: Reported on 02/14/2023 01/06/23   [name], MD  senna-docusate (SENOKOT-S) 8.6-50 MG tablet Take 1 tablet by mouth at bedtime as needed for mild constipation. Patient not taking: Reported on 02/14/2023 01/20/23   Long, [name], MD  trimethoprim-polymyxin b (POLYTRIM) ophthalmic solution Place 1 drop into both eyes every 4 (four) hours. Patient not taking: Reported on 10/09/2022 09/28/22   Ernie Avena, MD  vitamin C (ASCORBIC ACID) 500 MG tablet Take 500 mg by mouth daily.    [provider]      Allergies    Aspirin    Review of Systems   Review of Systems  Constitutional:  Negative for fever.  Musculoskeletal:  Positive for back pain.  Neurological:  Negative for weakness and numbness.     Physical Exam Updated Vital Signs BP (!) 173/93 (BP Location: Right Arm)   Pulse 61   Temp 98.1 F (36.7 C) (Oral)   Resp 19   SpO2 99%  Physical Exam Vitals and nursing note reviewed.  Constitutional:      General: He is not in acute distress.    Appearance: He is not ill-appearing.  HENT:     Head: Normocephalic.  Eyes:     Pupils: Pupils are equal, round, and reactive to light.  Cardiovascular:     Rate and Rhythm: Normal rate and regular rhythm.     Pulses: Normal pulses.     Heart sounds: Normal heart sounds. No murmur heard.    No friction rub. No gallop.  Pulmonary:     Effort: Pulmonary effort is normal.     Breath sounds: Normal breath sounds.  Abdominal:     General: Abdomen is flat. There is no distension.     Palpations: Abdomen is soft.     Tenderness: There is no abdominal tenderness. There is no guarding or rebound.  Musculoskeletal:        General: Normal range of motion.     Cervical back: Neck supple.     Comments: Mild lumbar midline tenderness.  Bilateral lower extremities neurovascularly intact.  Skin:    General: Skin is warm and dry.  Neurological:     General: No focal deficit present.     Mental Status: He is alert.  Psychiatric:        Mood and Affect: Mood normal.        Behavior: Behavior normal.     ED Results / Procedures / Treatments   Labs (all labs ordered are listed, but only abnormal results are displayed) Labs Reviewed  CBC WITH DIFFERENTIAL/PLATELET - Abnormal; Notable for the following components:      Result Value   WBC 3.6 (*)    RBC 3.89 (*)    Hemoglobin 12.5 (*)    HCT 38.0 (*)    Neutro Abs 1.5 (*)    All other components within normal limits  BASIC METABOLIC PANEL    EKG None  Radiology CT Lumbar Spine Wo Contrast  Result Date: 04/01/2023 CLINICAL DATA:  Low back pain EXAM: CT LUMBAR SPINE WITHOUT CONTRAST TECHNIQUE: Multidetector CT imaging of the lumbar spine was performed  without intravenous  contrast administration. Multiplanar CT image reconstructions were also generated. RADIATION DOSE REDUCTION: This exam was performed according to the departmental dose-optimization program which includes automated exposure control, adjustment of the mA and/or kV according to patient size and/or use of iterative reconstruction technique. COMPARISON:  03/01/2023 FINDINGS: Segmentation: 5 lumbar type vertebrae. Alignment: Alignment is anatomic. Vertebrae: There are no acute displaced lumbar spine fractures. Stable postsurgical changes from prior right L5 laminotomy. No destructive bony abnormalities. Stable hypertrophic changes of the sacroiliac joints. Paraspinal and other soft tissues: Paraspinal soft tissues are unremarkable. Punctate bilateral nonobstructing renal calculi are unchanged. Severe bilateral hip osteoarthritis again noted. Disc levels: Stable multilevel lumbar spondylosis and facet hypertrophy, greatest at L4-5 at L5-S1. Reconstructed images demonstrate no additional findings. IMPRESSION: 1. No acute lumbar spine fracture. 2. Stable multilevel lumbar degenerative changes, greatest at L4-5 and L5-S1. 3. Stable punctate bilateral nonobstructing renal calculi. 4. Severe bilateral hip osteoarthritis. Electronically Signed   By: Sharlet Salina M.D.   On: 04/01/2023 18:41    Procedures Procedures    Medications Ordered in ED Medications  lidocaine (LIDODERM) 5 % 1 patch (1 patch Transdermal Patch Applied 04/01/23 1934)  methocarbamol (ROBAXIN) tablet 500 mg (500 mg Oral Given 04/01/23 1640)  morphine (PF) 4 MG/ML injection 4 mg (4 mg Intravenous Given 04/01/23 1640)    ED Course/ Medical Decision Making/ A&P                                 Medical Decision Making Amount and/or Complexity of Data Reviewed Labs: ordered. Decision-making details documented in ED Course. Radiology: ordered and independent interpretation performed. Decision-making details documented in ED  Course.  Risk Prescription drug management.   72 year old male presents to the ED due to acute on chronic low back pain.  History of prostate cancer and multiple myeloma.  Had a negative CT lumbar spine 1 month ago.  On chronic Percocet however, notes that it is no longer working.  No fever or chills.  No IV drug use.  Denies saddle anesthesia, bowel/bladder incontinence, lower extreme numbness/tingling, lower extremity weakness.  Patient seen yesterday in the ED and discharged with Valium.  Patient notes this is not helping.  No recent injury.  Upon arrival patient afebrile, not tachycardic or hypoxic.  Very mild lumbar midline tenderness.  Bilateral lower extremities neurovascularly intact.  Low suspicion for central cord compression or cauda equina.  No infectious symptoms to suggest infectious etiology.  Will obtain repeat CT lumbar spine given history of prostate cancer and multiple myeloma.  Routine labs ordered.  IV morphine and Robaxin given.  7:25 PM reassessed patient at bedside.  Patient mitts to improvement in pain after morphine and Robaxin.  Patient requesting a prescription for Robaxin.  Patient able to ambulate without difficulty.  Low suspicion for central cord compression or cauda equina.  No infectious symptoms to suggest infectious etiology. CBC significant for leukopenia and anemia. Normal platelets. BMP unremarkable. CT scan personally reviewed and interpreted which negative for any bony fractures.  Does demonstrate degenerative changes and severe OA of bilateral hips.  Patient discharged with neurosurgery referral for further treatment for low back pain.  Robaxin prescription given.  Patient stable for discharge. Strict ED precautions discussed with patient. Patient states understanding and agrees to plan. Patient discharged home in no acute distress and stable vitals  Lives at home Hx chronic low back pain Has PCP  Final Clinical Impression(s) / ED Diagnoses Final  diagnoses:  Chronic bilateral low back pain without sciatica    Rx / DC Orders ED Discharge Orders          Ordered    methocarbamol (ROBAXIN) 500 MG tablet  2 times daily        04/01/23 2021              Jesusita Oka 04/01/23 2024    Laurence Spates, MD 04/02/23 [phone]

## 2023-04-01 NOTE — Discharge Instructions (Addendum)
It was a pleasure taking care of you today.  As discussed, your CT scan was reassuring.  I have included the number of the neurosurgeon.  Please call to schedule an appointment for further evaluation.  I am sending you home with muscle relaxers.  Take as needed for pain.  Return to the ER for new or worsening symptoms.

## 2023-04-01 NOTE — ED Triage Notes (Signed)
Pt bib [facility] from home with complaints of lower back pain that he has had for 15 years and has been on percocet for the pain but the medication does not do anything for the pain anymore.

## 2023-04-01 NOTE — ED Notes (Signed)
Pt ambulated to bathroom appropriately. No signs of distress, denies increased pain.

## 2023-04-02 ENCOUNTER — Other Ambulatory Visit: Payer: Self-pay

## 2023-04-02 ENCOUNTER — Other Ambulatory Visit (HOSPITAL_COMMUNITY): Payer: Self-pay

## 2023-04-03 ENCOUNTER — Other Ambulatory Visit (HOSPITAL_COMMUNITY): Payer: Self-pay

## 2023-04-04 ENCOUNTER — Other Ambulatory Visit (HOSPITAL_COMMUNITY): Payer: Self-pay

## 2023-04-08 ENCOUNTER — Other Ambulatory Visit (HOSPITAL_COMMUNITY): Payer: Self-pay

## 2023-04-08 MED ORDER — OXYCODONE-ACETAMINOPHEN 10-325 MG PO TABS
1.0000 | ORAL_TABLET | Freq: Two times a day (BID) | ORAL | 0 refills | Status: DC | PRN
  Filled 2023-04-08 – 2023-05-08 (×3): qty 60, 30d supply, fill #0
  Filled ????-??-??: fill #0

## 2023-04-10 ENCOUNTER — Other Ambulatory Visit (HOSPITAL_COMMUNITY): Payer: Self-pay

## 2023-04-11 ENCOUNTER — Inpatient Hospital Stay: Payer: 59 | Attending: Oncology

## 2023-04-11 ENCOUNTER — Inpatient Hospital Stay: Payer: 59 | Admitting: Hematology

## 2023-04-11 ENCOUNTER — Inpatient Hospital Stay: Payer: 59

## 2023-04-11 ENCOUNTER — Other Ambulatory Visit: Payer: Self-pay

## 2023-04-11 VITALS — BP 144/85 | HR 63 | Temp 98.6°F | Resp 18 | Ht 73.0 in | Wt 156.0 lb

## 2023-04-11 DIAGNOSIS — C9000 Multiple myeloma not having achieved remission: Secondary | ICD-10-CM | POA: Insufficient documentation

## 2023-04-11 DIAGNOSIS — Z79620 Long term (current) use of immunosuppressive biologic: Secondary | ICD-10-CM | POA: Insufficient documentation

## 2023-04-11 DIAGNOSIS — C9001 Multiple myeloma in remission: Secondary | ICD-10-CM

## 2023-04-11 DIAGNOSIS — Z5112 Encounter for antineoplastic immunotherapy: Secondary | ICD-10-CM

## 2023-04-11 LAB — CBC WITH DIFFERENTIAL (CANCER CENTER ONLY)
Abs Immature Granulocytes: 0.01 10*3/uL (ref 0.00–0.07)
Basophils Absolute: 0 10*3/uL (ref 0.0–0.1)
Basophils Relative: 0 %
Eosinophils Absolute: 0 10*3/uL (ref 0.0–0.5)
Eosinophils Relative: 0 %
HCT: 35 % — ABNORMAL LOW (ref 39.0–52.0)
Hemoglobin: 11.8 g/dL — ABNORMAL LOW (ref 13.0–17.0)
Immature Granulocytes: 0 %
Lymphocytes Relative: 36 %
Lymphs Abs: 1.6 10*3/uL (ref 0.7–4.0)
MCH: 32.9 pg (ref 26.0–34.0)
MCHC: 33.7 g/dL (ref 30.0–36.0)
MCV: 97.5 fL (ref 80.0–100.0)
Monocytes Absolute: 0.2 10*3/uL (ref 0.1–1.0)
Monocytes Relative: 5 %
Neutro Abs: 2.6 10*3/uL (ref 1.7–7.7)
Neutrophils Relative %: 59 %
Platelet Count: 161 10*3/uL (ref 150–400)
RBC: 3.59 MIL/uL — ABNORMAL LOW (ref 4.22–5.81)
RDW: 12.2 % (ref 11.5–15.5)
WBC Count: 4.4 10*3/uL (ref 4.0–10.5)
nRBC: 0 % (ref 0.0–0.2)

## 2023-04-11 LAB — CMP (CANCER CENTER ONLY)
ALT: 90 U/L — ABNORMAL HIGH (ref 0–44)
AST: 93 U/L — ABNORMAL HIGH (ref 15–41)
Albumin: 4.3 g/dL (ref 3.5–5.0)
Alkaline Phosphatase: 104 U/L (ref 38–126)
Anion gap: 3 — ABNORMAL LOW (ref 5–15)
BUN: 12 mg/dL (ref 8–23)
CO2: 29 mmol/L (ref 22–32)
Calcium: 9.3 mg/dL (ref 8.9–10.3)
Chloride: 103 mmol/L (ref 98–111)
Creatinine: 0.81 mg/dL (ref 0.61–1.24)
GFR, Estimated: >60 mL/min (ref >60)
Glucose, Bld: 97 mg/dL (ref 70–99)
Potassium: 4.2 mmol/L (ref 3.5–5.1)
Sodium: 135 mmol/L (ref 135–145)
Total Bilirubin: 0.4 mg/dL (ref 0.3–1.2)
Total Protein: 7.6 g/dL (ref 6.5–8.1)

## 2023-04-11 MED ORDER — DIPHENHYDRAMINE HCL 25 MG PO CAPS
50.0000 mg | ORAL_CAPSULE | Freq: Once | ORAL | Status: AC
  Administered 2023-04-11: 50 mg via ORAL
  Filled 2023-04-11: qty 2

## 2023-04-11 MED ORDER — DARATUMUMAB-HYALURONIDASE-FIHJ 1800-30000 MG-UT/15ML ~~LOC~~ SOLN
1800.0000 mg | Freq: Once | SUBCUTANEOUS | Status: AC
  Administered 2023-04-11: 1800 mg via SUBCUTANEOUS
  Filled 2023-04-11: qty 15

## 2023-04-11 MED ORDER — ACETAMINOPHEN 325 MG PO TABS
650.0000 mg | ORAL_TABLET | Freq: Once | ORAL | Status: AC
  Administered 2023-04-11: 650 mg via ORAL
  Filled 2023-04-11: qty 2

## 2023-04-11 MED ORDER — DEXAMETHASONE 4 MG PO TABS
8.0000 mg | ORAL_TABLET | Freq: Once | ORAL | Status: AC
  Administered 2023-04-11: 8 mg via ORAL
  Filled 2023-04-11: qty 2

## 2023-04-11 NOTE — Addendum Note (Signed)
Addended by: Carley Hammed A on: 04/11/2023 09:52 AM   Modules accepted: Orders

## 2023-04-11 NOTE — Progress Notes (Signed)
Patient seen by Dr. Addison Naegeli are within treatment parameters.  2 nd BP 144/85   Labs reviewed: and are within treatment parameters.  Per physician team, patient is ready for treatment and there are NO modifications to the treatment plan. Pt will change to every 2 weeks tx

## 2023-04-11 NOTE — Patient Instructions (Signed)
[facility]  Discharge Instructions: Thank you for choosing [facility] to provide your oncology and hematology care.   If you have a lab appointment with the Cancer Center, please go directly to the Cancer Center and check in at the registration area.   Wear comfortable clothing and clothing appropriate for easy access to any Portacath or PICC line.   We strive to give you quality time with your provider. You may need to reschedule your appointment if you arrive late (15 or more minutes).  Arriving late affects you and other patients whose appointments are after yours.  Also, if you miss three or more appointments without notifying the office, you may be dismissed from the clinic at the provider's discretion.      For prescription refill requests, have your pharmacy contact our office and allow 72 hours for refills to be completed.    Today you received the following chemotherapy and/or immunotherapy agents darzalex      To help prevent nausea and vomiting after your treatment, we encourage you to take your nausea medication as directed.  BELOW ARE SYMPTOMS THAT SHOULD BE REPORTED IMMEDIATELY: *FEVER GREATER THAN 100.4 F (38 C) OR HIGHER *CHILLS OR SWEATING *NAUSEA AND VOMITING THAT IS NOT CONTROLLED WITH YOUR NAUSEA MEDICATION *UNUSUAL SHORTNESS OF BREATH *UNUSUAL BRUISING OR BLEEDING *URINARY PROBLEMS (pain or burning when urinating, or frequent urination) *BOWEL PROBLEMS (unusual diarrhea, constipation, pain near the anus) TENDERNESS IN MOUTH AND THROAT WITH OR WITHOUT PRESENCE OF ULCERS (sore throat, sores in mouth, or a toothache) UNUSUAL RASH, SWELLING OR PAIN  UNUSUAL VAGINAL DISCHARGE OR ITCHING   Items with * indicate a potential emergency and should be followed up as soon as possible or go to the Emergency Department if any problems should occur.  Please show the CHEMOTHERAPY ALERT CARD or IMMUNOTHERAPY ALERT CARD at  check-in to the Emergency Department and triage nurse.  Should you have questions after your visit or need to cancel or reschedule your appointment, please contact [facility]  Dept: [phone]  and follow the prompts.  Office hours are 8:00 a.m. to 4:30 p.m. Monday - Friday. Please note that voicemails left after 4:00 p.m. may not be returned until the following business day.  We are closed weekends and major holidays. You have access to a nurse at all times for urgent questions. Please call the main number to the clinic Dept: [phone] and follow the prompts.   For any non-urgent questions, you may also contact your provider using MyChart. We now offer e-Visits for anyone 18 and older to request care online for non-urgent symptoms. For details visit [email or web address].   Also download the MyChart app! Go to the app store, search "MyChart", open the app, select [facility], and log in with your MyChart username and password.   
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
## 2023-04-14 ENCOUNTER — Inpatient Hospital Stay: Payer: 59 | Admitting: Licensed Clinical Social Worker

## 2023-04-14 DIAGNOSIS — C9001 Multiple myeloma in remission: Secondary | ICD-10-CM

## 2023-04-14 NOTE — Progress Notes (Signed)
[facility] CSW Progress Note  Visual merchandiser met with patient briefly in supportive services.  Pt states he has seen Polo Riley in the past and wished only to speak with her.  Pt states he would like to speak with her about assistance, but did not provide details.  Message sent to Lauren to follow up w/ pt at [phone].    Rachel Moulds, LCSW Clinical Social Worker [facility]    Patient is participating in a Managed Medicaid Plan:  Yes

## 2023-04-15 ENCOUNTER — Encounter: Payer: Self-pay | Admitting: Hematology

## 2023-04-15 LAB — MULTIPLE MYELOMA PANEL, SERUM
Albumin SerPl Elph-Mcnc: 4 g/dL (ref 2.9–4.4)
Albumin/Glob SerPl: 1.3 (ref 0.7–1.7)
Alpha 1: 0.2 g/dL (ref 0.0–0.4)
Alpha2 Glob SerPl Elph-Mcnc: 0.6 g/dL (ref 0.4–1.0)
B-Globulin SerPl Elph-Mcnc: 0.8 g/dL (ref 0.7–1.3)
Gamma Glob SerPl Elph-Mcnc: 1.6 g/dL (ref 0.4–1.8)
Globulin, Total: 3.2 g/dL (ref 2.2–3.9)
IgA: 19 mg/dL — ABNORMAL LOW (ref 61–437)
IgG (Immunoglobin G), Serum: 1854 mg/dL — ABNORMAL HIGH (ref 603–1613)
IgM (Immunoglobulin M), Srm: 20 mg/dL (ref 20–172)
M Protein SerPl Elph-Mcnc: 1.3 g/dL — ABNORMAL HIGH
Total Protein ELP: 7.2 g/dL (ref 6.0–8.5)

## 2023-04-17 ENCOUNTER — Encounter: Payer: Self-pay | Admitting: Hematology

## 2023-04-18 ENCOUNTER — Encounter: Payer: Self-pay | Admitting: Hematology

## 2023-04-23 ENCOUNTER — Encounter: Payer: 59 | Admitting: Podiatry

## 2023-04-24 ENCOUNTER — Inpatient Hospital Stay: Payer: 59 | Attending: Oncology | Admitting: *Deleted

## 2023-04-24 DIAGNOSIS — Z79620 Long term (current) use of immunosuppressive biologic: Secondary | ICD-10-CM | POA: Insufficient documentation

## 2023-04-24 DIAGNOSIS — C9000 Multiple myeloma not having achieved remission: Secondary | ICD-10-CM | POA: Insufficient documentation

## 2023-04-24 DIAGNOSIS — Z5112 Encounter for antineoplastic immunotherapy: Secondary | ICD-10-CM | POA: Insufficient documentation

## 2023-04-24 DIAGNOSIS — Z79899 Other long term (current) drug therapy: Secondary | ICD-10-CM | POA: Insufficient documentation

## 2023-04-24 NOTE — Progress Notes (Signed)
[facility] Clinical Social Work  Patient recently visited support center requesting to speak with CSW. CSW was unavailable at time of visit. Clinical Social Worker contacted patient by phone to offer support and assess for needs.    Travis Nguyen was inquiring if there are any grants available to assist with basic needs since he is once again in treatment. CSW provided information on [employer] Patient Aid application. [employer] plans to pick up application tomorrow before his appointment and will complete patient portion.    Polo Riley, LCSW  Clinical Social Worker [facility]

## 2023-04-25 ENCOUNTER — Other Ambulatory Visit (HOSPITAL_COMMUNITY): Payer: Self-pay

## 2023-04-25 ENCOUNTER — Other Ambulatory Visit: Payer: Self-pay

## 2023-04-25 ENCOUNTER — Inpatient Hospital Stay: Payer: 59

## 2023-04-25 VITALS — BP 173/95 | HR 57 | Temp 98.2°F | Resp 18

## 2023-04-25 DIAGNOSIS — C9001 Multiple myeloma in remission: Secondary | ICD-10-CM

## 2023-04-25 DIAGNOSIS — Z79620 Long term (current) use of immunosuppressive biologic: Secondary | ICD-10-CM | POA: Diagnosis not present

## 2023-04-25 DIAGNOSIS — C9000 Multiple myeloma not having achieved remission: Secondary | ICD-10-CM | POA: Diagnosis not present

## 2023-04-25 DIAGNOSIS — Z5112 Encounter for antineoplastic immunotherapy: Secondary | ICD-10-CM | POA: Diagnosis not present

## 2023-04-25 DIAGNOSIS — Z95828 Presence of other vascular implants and grafts: Secondary | ICD-10-CM

## 2023-04-25 DIAGNOSIS — Z79899 Other long term (current) drug therapy: Secondary | ICD-10-CM | POA: Diagnosis not present

## 2023-04-25 LAB — CBC WITH DIFFERENTIAL (CANCER CENTER ONLY)
Abs Immature Granulocytes: 0.01 10*3/uL (ref 0.00–0.07)
Basophils Absolute: 0 10*3/uL (ref 0.0–0.1)
Basophils Relative: 0 %
Eosinophils Absolute: 0 10*3/uL (ref 0.0–0.5)
Eosinophils Relative: 1 %
HCT: 34.2 % — ABNORMAL LOW (ref 39.0–52.0)
Hemoglobin: 11.4 g/dL — ABNORMAL LOW (ref 13.0–17.0)
Immature Granulocytes: 0 %
Lymphocytes Relative: 49 %
Lymphs Abs: 1.7 10*3/uL (ref 0.7–4.0)
MCH: 32.6 pg (ref 26.0–34.0)
MCHC: 33.3 g/dL (ref 30.0–36.0)
MCV: 97.7 fL (ref 80.0–100.0)
Monocytes Absolute: 0.3 10*3/uL (ref 0.1–1.0)
Monocytes Relative: 7 %
Neutro Abs: 1.5 10*3/uL — ABNORMAL LOW (ref 1.7–7.7)
Neutrophils Relative %: 43 %
Platelet Count: 154 10*3/uL (ref 150–400)
RBC: 3.5 MIL/uL — ABNORMAL LOW (ref 4.22–5.81)
RDW: 11.9 % (ref 11.5–15.5)
WBC Count: 3.5 10*3/uL — ABNORMAL LOW (ref 4.0–10.5)
nRBC: 0 % (ref 0.0–0.2)

## 2023-04-25 LAB — CMP (CANCER CENTER ONLY)
ALT: 16 U/L (ref 0–44)
AST: 16 U/L (ref 15–41)
Albumin: 4.3 g/dL (ref 3.5–5.0)
Alkaline Phosphatase: 73 U/L (ref 38–126)
Anion gap: 3 — ABNORMAL LOW (ref 5–15)
BUN: 13 mg/dL (ref 8–23)
CO2: 30 mmol/L (ref 22–32)
Calcium: 9.2 mg/dL (ref 8.9–10.3)
Chloride: 101 mmol/L (ref 98–111)
Creatinine: 0.86 mg/dL (ref 0.61–1.24)
GFR, Estimated: >60 mL/min (ref >60)
Glucose, Bld: 115 mg/dL — ABNORMAL HIGH (ref 70–99)
Potassium: 4.3 mmol/L (ref 3.5–5.1)
Sodium: 134 mmol/L — ABNORMAL LOW (ref 135–145)
Total Bilirubin: 0.5 mg/dL (ref 0.3–1.2)
Total Protein: 7.6 g/dL (ref 6.5–8.1)

## 2023-04-25 MED ORDER — SODIUM CHLORIDE 0.9% FLUSH: 10.0000 mL | INTRAVENOUS | Status: DC | PRN

## 2023-04-25 MED ORDER — DIPHENHYDRAMINE HCL 25 MG PO CAPS
50.0000 mg | ORAL_CAPSULE | Freq: Once | ORAL | Status: AC
  Administered 2023-04-25: 50 mg via ORAL
  Filled 2023-04-25: qty 2

## 2023-04-25 MED ORDER — DARATUMUMAB-HYALURONIDASE-FIHJ 1800-30000 MG-UT/15ML ~~LOC~~ SOLN
1800.0000 mg | Freq: Once | SUBCUTANEOUS | Status: AC
  Administered 2023-04-25: 1800 mg via SUBCUTANEOUS
  Filled 2023-04-25: qty 15

## 2023-04-25 MED ORDER — SODIUM CHLORIDE 0.9% FLUSH
10.0000 mL | INTRAVENOUS | Status: DC | PRN
  Administered 2023-04-25: 10 mL

## 2023-04-25 MED ORDER — HEPARIN SOD (PORK) LOCK FLUSH 100 UNIT/ML IV SOLN
500.0000 [IU] | Freq: Once | INTRAVENOUS | Status: AC | PRN
  Administered 2023-04-25: 500 [IU]

## 2023-04-25 MED ORDER — DEXAMETHASONE 4 MG PO TABS
8.0000 mg | ORAL_TABLET | Freq: Once | ORAL | Status: AC
  Administered 2023-04-25: 8 mg via ORAL
  Filled 2023-04-25: qty 2

## 2023-04-25 MED ORDER — ACETAMINOPHEN 325 MG PO TABS
650.0000 mg | ORAL_TABLET | Freq: Once | ORAL | Status: AC
  Administered 2023-04-25: 650 mg via ORAL
  Filled 2023-04-25: qty 2

## 2023-04-25 NOTE — Patient Instructions (Addendum)
You can take Senakot S or Miralax for you constipation   [facility]  Discharge Instructions: Thank you for choosing [facility] to provide your oncology and hematology care.   If you have a lab appointment with the Cancer Center, please go directly to the Cancer Center and check in at the registration area.   Wear comfortable clothing and clothing appropriate for easy access to any Portacath or PICC line.   We strive to give you quality time with your provider. You may need to reschedule your appointment if you arrive late (15 or more minutes).  Arriving late affects you and other patients whose appointments are after yours.  Also, if you miss three or more appointments without notifying the office, you may be dismissed from the clinic at the provider's discretion.      For prescription refill requests, have your pharmacy contact our office and allow 72 hours for refills to be completed.    Today you received the following chemotherapy and/or immunotherapy agents darzalex      To help prevent nausea and vomiting after your treatment, we encourage you to take your nausea medication as directed.  BELOW ARE SYMPTOMS THAT SHOULD BE REPORTED IMMEDIATELY: *FEVER GREATER THAN 100.4 F (38 C) OR HIGHER *CHILLS OR SWEATING *NAUSEA AND VOMITING THAT IS NOT CONTROLLED WITH YOUR NAUSEA MEDICATION *UNUSUAL SHORTNESS OF BREATH *UNUSUAL BRUISING OR BLEEDING *URINARY PROBLEMS (pain or burning when urinating, or frequent urination) *BOWEL PROBLEMS (unusual diarrhea, constipation, pain near the anus) TENDERNESS IN MOUTH AND THROAT WITH OR WITHOUT PRESENCE OF ULCERS (sore throat, sores in mouth, or a toothache) UNUSUAL RASH, SWELLING OR PAIN  UNUSUAL VAGINAL DISCHARGE OR ITCHING   Items with * indicate a potential emergency and should be followed up as soon as possible or go to the Emergency Department if any problems should occur.  Please show the  CHEMOTHERAPY ALERT CARD or IMMUNOTHERAPY ALERT CARD at check-in to the Emergency Department and triage nurse.  Should you have questions after your visit or need to cancel or reschedule your appointment, please contact [facility]  Dept: [phone]  and follow the prompts.  Office hours are 8:00 a.m. to 4:30 p.m. Monday - Friday. Please note that voicemails left after 4:00 p.m. may not be returned until the following business day.  We are closed weekends and major holidays. You have access to a nurse at all times for urgent questions. Please call the main number to the clinic Dept: [phone] and follow the prompts.   For any non-urgent questions, you may also contact your provider using MyChart. We now offer e-Visits for anyone 4 and older to request care online for non-urgent symptoms. For details visit [email or web address].   Also download the MyChart app! Go to the app store, search "MyChart", open the app, select [place], and log in with your MyChart username and password.

## 2023-04-25 NOTE — Patient Instructions (Signed)

## 2023-04-28 ENCOUNTER — Encounter: Payer: Self-pay | Admitting: Hematology

## 2023-04-28 ENCOUNTER — Emergency Department (HOSPITAL_COMMUNITY)
Admission: EM | Admit: 2023-04-28 | Discharge: 2023-04-28 | Discharge status: Against Medical Advice | Payer: 59 | Attending: Emergency Medicine | Admitting: Emergency Medicine

## 2023-04-28 ENCOUNTER — Encounter (HOSPITAL_COMMUNITY): Payer: Self-pay

## 2023-04-28 DIAGNOSIS — G8929 Other chronic pain: Secondary | ICD-10-CM | POA: Diagnosis not present

## 2023-04-28 DIAGNOSIS — M545 Low back pain, unspecified: Secondary | ICD-10-CM | POA: Insufficient documentation

## 2023-04-28 DIAGNOSIS — Z5321 Procedure and treatment not carried out due to patient leaving prior to being seen by health care provider: Secondary | ICD-10-CM | POA: Insufficient documentation

## 2023-04-28 HISTORY — DX: Other intervertebral disc degeneration, lumbar region without mention of lumbar back pain or lower extremity pain: M51.369

## 2023-04-28 NOTE — ED Triage Notes (Signed)
Pt to ED POV from home. Pt states he has a hx of degenerative disc dx in lower back. Pt states he has been here in the past for same. Pt c/o chronic lower back pain. Pt denies any falls or injuries. Pt denies seeing any specialist out pt.

## 2023-04-29 ENCOUNTER — Other Ambulatory Visit (HOSPITAL_COMMUNITY): Payer: Self-pay

## 2023-04-29 ENCOUNTER — Emergency Department (HOSPITAL_COMMUNITY)
Admission: EM | Admit: 2023-04-29 | Discharge: 2023-04-29 | Disposition: A | Discharge status: Home / Self Care | Payer: 59 | Attending: Emergency Medicine | Admitting: Emergency Medicine

## 2023-04-29 DIAGNOSIS — M545 Low back pain, unspecified: Secondary | ICD-10-CM | POA: Diagnosis present

## 2023-04-29 DIAGNOSIS — M5441 Lumbago with sciatica, right side: Secondary | ICD-10-CM | POA: Insufficient documentation

## 2023-04-29 DIAGNOSIS — G8929 Other chronic pain: Secondary | ICD-10-CM | POA: Insufficient documentation

## 2023-04-29 DIAGNOSIS — Z8579 Personal history of other malignant neoplasms of lymphoid, hematopoietic and related tissues: Secondary | ICD-10-CM | POA: Insufficient documentation

## 2023-04-29 DIAGNOSIS — Z8546 Personal history of malignant neoplasm of prostate: Secondary | ICD-10-CM | POA: Insufficient documentation

## 2023-04-29 DIAGNOSIS — M5442 Lumbago with sciatica, left side: Secondary | ICD-10-CM | POA: Diagnosis not present

## 2023-04-29 LAB — MULTIPLE MYELOMA PANEL, SERUM
Albumin SerPl Elph-Mcnc: 3.7 g/dL (ref 2.9–4.4)
Albumin/Glob SerPl: 1.2 (ref 0.7–1.7)
Alpha 1: 0.2 g/dL (ref 0.0–0.4)
Alpha2 Glob SerPl Elph-Mcnc: 0.6 g/dL (ref 0.4–1.0)
B-Globulin SerPl Elph-Mcnc: 0.9 g/dL (ref 0.7–1.3)
Gamma Glob SerPl Elph-Mcnc: 1.6 g/dL (ref 0.4–1.8)
Globulin, Total: 3.3 g/dL (ref 2.2–3.9)
IgA: 18 mg/dL — ABNORMAL LOW (ref 61–437)
IgG (Immunoglobin G), Serum: 1977 mg/dL — ABNORMAL HIGH (ref 603–1613)
IgM (Immunoglobulin M), Srm: 18 mg/dL — ABNORMAL LOW (ref 20–172)
M Protein SerPl Elph-Mcnc: 1.4 g/dL — ABNORMAL HIGH
Total Protein ELP: 7 g/dL (ref 6.0–8.5)

## 2023-04-29 MED ORDER — METHYLPREDNISOLONE 4 MG PO TBPK
ORAL_TABLET | ORAL | 0 refills | Status: AC
  Filled 2023-04-29: qty 21, 6d supply, fill #0

## 2023-04-29 MED ORDER — NAPROXEN 500 MG PO TABS
500.0000 mg | ORAL_TABLET | Freq: Two times a day (BID) | ORAL | 0 refills | Status: DC
  Filled 2023-04-29: qty 30, 15d supply, fill #0

## 2023-04-29 MED ORDER — PREDNISONE 20 MG PO TABS
60.0000 mg | ORAL_TABLET | Freq: Once | ORAL | Status: AC
  Administered 2023-04-29: 60 mg via ORAL
  Filled 2023-04-29: qty 3

## 2023-04-29 MED ORDER — ACETAMINOPHEN 500 MG PO TABS
1000.0000 mg | ORAL_TABLET | Freq: Once | ORAL | Status: AC
  Administered 2023-04-29: 1000 mg via ORAL
  Filled 2023-04-29: qty 2

## 2023-04-29 MED ORDER — LIDOCAINE 5 % EX PTCH
1.0000 | MEDICATED_PATCH | Freq: Once | CUTANEOUS | Status: DC
  Administered 2023-04-29: 1 via TRANSDERMAL
  Filled 2023-04-29: qty 1

## 2023-04-29 MED ORDER — NAPROXEN 500 MG PO TABS
500.0000 mg | ORAL_TABLET | Freq: Once | ORAL | Status: AC
  Administered 2023-04-29: 500 mg via ORAL
  Filled 2023-04-29: qty 1

## 2023-04-29 MED ORDER — MORPHINE SULFATE (PF) 4 MG/ML IV SOLN
4.0000 mg | Freq: Once | INTRAVENOUS | Status: AC
  Administered 2023-04-29: 4 mg via INTRAMUSCULAR
  Filled 2023-04-29: qty 1

## 2023-04-29 NOTE — ED Provider Notes (Signed)
[place] EMERGENCY DEPARTMENT AT [facility] Provider Note   CSN: [id number] Arrival date & time: 04/29/23  4098     History  No chief complaint on file.   Travis Nguyen is a 72 y.o. male.  Patient is a 72 year old male with a past medical history of multiple myeloma and prostate cancer in remission, hypertension and chronic back pain presenting to the emergency department for his chronic back pain.  Patient states that he has been seen in the emergency department several times and has tried several different pain medications and muscle relaxers.  He states that the medications will work for a few days and then his pain starts to come back and worsen.  He denies any new trauma or falls, numbness or weakness, saddle anesthesia, loss of bowel or bladder function.  He states that he talked with his primary doctor who is sent him a referral for pain management but does not yet have an appointment scheduled.  The history is provided by the patient.       Home Medications Prior to Admission medications   Medication Sig Start Date End Date Taking? Authorizing Provider  methylPREDNISolone (MEDROL DOSEPAK) 4 MG TBPK tablet Take 6 tablets (24 mg total) by mouth daily for 1 day, THEN 5 tablets (20 mg total) daily for 1 day, THEN 4 tablets (16 mg total) daily for 1 day, THEN 3 tablets (12 mg total) daily for 1 day, THEN 2 tablets (8 mg total) daily for 1 day, THEN 1 tablet (4 mg total) daily for 1 day. 04/29/23 05/05/23 Yes Kingsley, Benetta Spar K, DO  naproxen (NAPROSYN) 500 MG tablet Take 1 tablet (500 mg total) by mouth 2 (two) times daily. 04/29/23  Yes Theresia Lo, Benetta Spar K, DO  acyclovir (ZOVIRAX) 400 MG tablet Take 1 tablet (400 mg total) by mouth 2 (two) times daily. Patient not taking: Reported on 02/14/2023 07/01/22   Benjiman Core, MD  amLODipine (NORVASC) 10 MG tablet Take 1 tablet by mouth once a day Patient not taking: Reported on 08/30/2022 04/24/21     amLODipine  (NORVASC) 5 MG tablet Take 1 tablet (5 mg total) by mouth daily. Patient not taking: Reported on 08/30/2022 02/25/22     amLODipine (NORVASC) 5 MG tablet Take 1 tablet (5 mg total) by mouth daily. 08/12/22     amLODipine (NORVASC) 5 MG tablet Take 1 tablet (5 mg total) by mouth daily. 03/13/23     bisacodyl 5 MG EC tablet Take by mouth as directed. Patient not taking: Reported on 02/14/2023 10/17/22   [name], MD  Cyanocobalamin (VITAMIN B12 PO) Take by mouth daily.    [provider]  cyclobenzaprine (FLEXERIL) 5 MG tablet Take 1 - 2 tablets by mouth at bedtime as needed Patient not taking: Reported on 10/09/2022 05/20/22     diazepam (VALIUM) 5 MG tablet Take 1 tablet (5 mg total) by mouth every 12 (twelve) hours as needed for muscle spasms. 03/31/23   [name], DO  escitalopram (LEXAPRO) 10 MG tablet Take 1 tablet by mouth once a day Patient not taking: Reported on 08/30/2022 04/24/21     escitalopram (LEXAPRO) 10 MG tablet Take 1 tablet by mouth once daily Patient not taking: Reported on 08/30/2022 07/24/21     escitalopram (LEXAPRO) 10 MG tablet Take 1 tablet (10 mg total) by mouth daily. Patient not taking: Reported on 08/30/2022 02/25/22     escitalopram (LEXAPRO) 10 MG tablet Take 1 tablet (10 mg total) by  mouth daily. Patient not taking: Reported on 02/14/2023 08/12/22     HYDROmorphone (DILAUDID) 4 MG tablet Take 1 tablet (4 mg) by mouth every 6 hours as needed for severe pain. Patient not taking: Reported on 02/14/2023 01/06/23   [name], MD  lidocaine-prilocaine (EMLA) cream APPLY TOPICALLY TO PORT-A-CATH DAILY AS NEEDED Patient not taking: Reported on 02/14/2023 06/28/22 06/28/23  Benjiman Core, MD  lidocaine-prilocaine (EMLA) cream Apply 1 Application topically once. Prior to port access    [provider]  losartan (COZAAR) 100 MG tablet TAKE 1 TABLET BY MOUTH ONCE A DAY 06/06/20 06/06/21  [name], MD  losartan (COZAAR) 100 MG tablet Take 1 tablet by mouth  once a day Patient not taking: Reported on 08/30/2022 04/24/21     losartan (COZAAR) 100 MG tablet Take 1 tablet by mouth daily Patient not taking: Reported on 08/30/2022 07/24/21     losartan (COZAAR) 100 MG tablet Take 1 tablet (100 mg total) by mouth daily. Patient not taking: Reported on 02/14/2023 02/25/22     losartan (COZAAR) 100 MG tablet Take 1 tablet (100 mg total) by mouth daily. Patient not taking: Reported on 08/30/2022 08/12/22     losartan (COZAAR) 100 MG tablet Take 1 tablet (100 mg total) by mouth daily. 03/13/23     methocarbamol (ROBAXIN) 500 MG tablet Take 1 tablet (500 mg) by mouth every 8 hours as needed for muscle spasms. Patient not taking: Reported on 03/11/2023 03/01/23   Long, [name], MD  methocarbamol (ROBAXIN) 500 MG tablet Take 1 tablet (500 mg total) by mouth 2 (two) times daily. 04/01/23   Mannie Stabile, PA-C  oxyCODONE-acetaminophen (PERCOCET) 10-325 MG tablet Take 1 tablet by mouth 2 times a day Patient not taking: Reported on 08/30/2022 12/15/20     oxyCODONE-acetaminophen (PERCOCET) 10-325 MG tablet Take 1 tablet by mouth twice daily as needed (07/05/21) Patient not taking: Reported on 02/14/2023 07/02/21     oxyCODONE-acetaminophen (PERCOCET) 10-325 MG tablet Take 1 tablet by mouth 2 (two) times daily as needed. Patient not taking: Reported on 08/30/2022 08/03/21     oxyCODONE-acetaminophen (PERCOCET) 10-325 MG tablet Take 1 tablet by mouth 2 times a day as needed orally (ok to fill 30 days after last refill) Patient not taking: Reported on 08/30/2022 02/25/22     oxyCODONE-acetaminophen (PERCOCET) 10-325 MG tablet Take 1 tablet by mouth 2 (two) times daily as needed. Patient not taking: Reported on 08/30/2022 04/29/22     oxyCODONE-acetaminophen (PERCOCET) 10-325 MG tablet Take 1 tablet by mouth 2 (two) times daily. okay to fill 30 days after last refill Patient not taking: Reported on 08/30/2022 05/22/22     oxyCODONE-acetaminophen (PERCOCET) 10-325 MG tablet Take 1  tablet by mouth 2 (two) times daily as needed. (ok to fill 30 days after last refill) Patient not taking: Reported on 08/30/2022 06/27/22     oxyCODONE-acetaminophen (PERCOCET) 10-325 MG tablet Take 1 tablet by mouth 2 (two) times daily as needed Patient not taking: Reported on 10/09/2022 09/23/22     oxyCODONE-acetaminophen (PERCOCET) 10-325 MG tablet Take 1 tablet by mouth 2 (two) times daily as needed. Patient not taking: Reported on 02/14/2023 11/19/22     oxyCODONE-acetaminophen (PERCOCET) 10-325 MG tablet Take 1 tablet by mouth 2 times daily. Patient not taking: Reported on 02/14/2023 01/14/23     oxyCODONE-acetaminophen (PERCOCET) 10-325 MG tablet Take 1 tablet by mouth 2 (two) times daily as needed. Patient not taking: Reported on 03/11/2023 02/10/23     oxyCODONE-acetaminophen (  PERCOCET) 10-325 MG tablet Take 1 tablet by mouth 2 (two) times daily as needed. 03/04/23     oxyCODONE-acetaminophen (PERCOCET) 10-325 MG tablet Take 1 tablet by mouth 2 (two) times daily. 03/13/23     oxyCODONE-acetaminophen (PERCOCET) 10-325 MG tablet Take 1 tablet by mouth 2 (two) times daily as needed. 04/08/23     pantoprazole (PROTONIX) 20 MG tablet Take 1 tablet (20 mg total) by mouth daily. Patient not taking: Reported on 08/30/2022 01/07/21   Tilden Fossa, MD  polyethylene glycol-electrolytes (NULYTELY) 420 g solution Use as directed. Patient not taking: Reported on 02/14/2023 10/17/22   [name], MD  senna-docusate (SENOKOT-S) 8.6-50 MG tablet Take 1 tablet by mouth at bedtime as needed for mild constipation. Patient not taking: Reported on 02/14/2023 01/20/23   Long, [name], MD  trimethoprim-polymyxin b (POLYTRIM) ophthalmic solution Place 1 drop into both eyes every 4 (four) hours. Patient not taking: Reported on 10/09/2022 09/28/22   Ernie Avena, MD  vitamin C (ASCORBIC ACID) 500 MG tablet Take 500 mg by mouth daily.    [provider]      Allergies    Aspirin    Review of Systems   Review of  Systems  Physical Exam Updated Vital Signs BP (!) 153/94 (BP Location: Left Arm)   Pulse (!) 59   Temp 98.9 F (37.2 C)   Resp 16   SpO2 100%  Physical Exam Vitals and nursing note reviewed.  Constitutional:      General: He is not in acute distress.    Appearance: Normal appearance.  HENT:     Head: Normocephalic and atraumatic.     Nose: Nose normal.     Mouth/Throat:     Mouth: Mucous membranes are moist.  Eyes:     Extraocular Movements: Extraocular movements intact.     Conjunctiva/sclera: Conjunctivae normal.  Cardiovascular:     Rate and Rhythm: Normal rate and regular rhythm.     Heart sounds: Normal heart sounds.  Pulmonary:     Effort: Pulmonary effort is normal.     Breath sounds: Normal breath sounds.  Abdominal:     General: Abdomen is flat.     Palpations: Abdomen is soft.     Tenderness: There is no abdominal tenderness.  Musculoskeletal:        General: Normal range of motion.     Cervical back: Normal range of motion.     Comments: No midline back tenderness, proximal lumbar bilateral paraspinal muscle tenderness to palpation Positive straight leg raise bilaterally  Skin:    General: Skin is warm and dry.  Neurological:     General: No focal deficit present.     Mental Status: He is alert and oriented to person, place, and time.     Sensory: No sensory deficit.     Motor: No weakness (5 out of 5 strength in bilateral hip flexion, knee extension, and plantar/dorsi flexion).  Psychiatric:        Mood and Affect: Mood normal.        Behavior: Behavior normal.     ED Results / Procedures / Treatments   Labs (all labs ordered are listed, but only abnormal results are displayed) Labs Reviewed - No data to display  EKG None  Radiology No results found.  Procedures Procedures    Medications Ordered in ED Medications  lidocaine (LIDODERM) 5 % 1-3 patch (1 patch Transdermal Patch Applied 04/29/23 0922)  acetaminophen (TYLENOL) tablet 1,000  mg (1,000 mg  Oral Given 04/29/23 0919)  naproxen (NAPROSYN) tablet 500 mg (500 mg Oral Given 04/29/23 0919)  morphine (PF) 4 MG/ML injection 4 mg (4 mg Intramuscular Given 04/29/23 0918)  predniSONE (DELTASONE) tablet 60 mg (60 mg Oral Given 04/29/23 0919)    ED Course/ Medical Decision Making/ A&P Clinical Course as of 04/29/23 0953  Tue Apr 29, 2023  0949 On reassessment, patient's pain is improved.  He is stable for discharge home with outpatient follow-up and will be given steroid taper.  He was given strict return precautions. [VK]    Clinical Course User Index [VK] Rexford Maus, DO                                 Medical Decision Making This patient presents to the ED with chief complaint(s) of back pain with pertinent past medical history of previous multiple myeloma and prostate cancer in remission, hypertension which further complicates the presenting complaint. The complaint involves an extensive differential diagnosis and also carries with it a high risk of complications and morbidity.    The differential diagnosis includes patient and recent imaging in September was normal making fracture or lytic lesion unlikely cause of the pain, considering chronic pain, sciatica, degenerative disc disease, no neurologic deficits, sinus seizure or loss of bowel or bladder function making cauda equina unlikely  Additional history obtained: Additional history obtained from N/A Records reviewed outpatient oncology records  ED Course and Reassessment: On patient's arrival to the emergency department he is hemodynamically stable in no acute distress without any neurologic deficits.  Patient has had recent imaging about 1 month ago that showed degenerative disc disease and no other acute disease.  This does appear consistent with his chronic back pain.  Patient does appear to have a component of sciatica as well.  Patient has previously been on Percocet prescribed by his primary doctor and  has tried both Valium and Robaxin for muscle relaxers.  Patient will be given a steroid taper as well as Tylenol, naproxen, lidocaine patch and a morphine injection and will be closely reassessed.  Independent labs interpretation:  N/A  Independent visualization of imaging: - N/A  Consultation: - Consulted or discussed management/test interpretation w/ external professional: N/A  Consideration for admission or further workup: Patient has no emergent conditions requiring admission or further work-up at this time and is stable for discharge home with primary care follow-up  Social Determinants of health: N/A    Risk OTC drugs. Prescription drug management.          Final Clinical Impression(s) / ED Diagnoses Final diagnoses:  Chronic bilateral low back pain with bilateral sciatica    Rx / DC Orders ED Discharge Orders          Ordered    methylPREDNISolone (MEDROL DOSEPAK) 4 MG TBPK tablet  Daily        04/29/23 0951    naproxen (NAPROSYN) 500 MG tablet  2 times daily        04/29/23 0951              Rexford Maus, DO 04/29/23 1523

## 2023-04-29 NOTE — ED Triage Notes (Signed)
Pt reports he has a hx of degenerative disc dx in lower back. Pt states he has been here in the past for same. Pt c/o chronic lower back pain. Pt denies any recent injuries, states medications at home not helping

## 2023-04-29 NOTE — Discharge Instructions (Addendum)
You were seen in the emergency department for your chronic back pain.  You had no signs of new injury and likely have a component of sciatica which is nerve type of pain in addition to your degenerative disc disease.  You can continue to take your Percocet as prescribed by your primary doctor as needed.  I have also given you naproxen that can be taken twice daily and a steroid taper course that you should complete as prescribed.  You should follow-up with your primary doctor and in the pain management clinic for reassessment and further management of your pain.  You should return to the emergency department if you have numbness or weakness in your legs, you are unable to walk, you are unable to urinate or if you have any other new or concerning symptoms.

## 2023-05-06 ENCOUNTER — Other Ambulatory Visit (HOSPITAL_COMMUNITY): Payer: Self-pay

## 2023-05-06 MED ORDER — OXYCODONE-ACETAMINOPHEN 10-325 MG PO TABS
1.0000 | ORAL_TABLET | Freq: Two times a day (BID) | ORAL | 0 refills | Status: DC
  Filled 2023-05-08 – 2023-06-06 (×2): qty 60, 30d supply, fill #0

## 2023-05-07 ENCOUNTER — Encounter: Payer: 59 | Admitting: Podiatry

## 2023-05-08 ENCOUNTER — Other Ambulatory Visit (HOSPITAL_COMMUNITY): Payer: Self-pay

## 2023-05-09 ENCOUNTER — Inpatient Hospital Stay: Payer: 59

## 2023-05-09 ENCOUNTER — Encounter: Payer: Self-pay | Admitting: Hematology

## 2023-05-09 ENCOUNTER — Ambulatory Visit: Payer: 59 | Admitting: Hematology

## 2023-05-09 ENCOUNTER — Inpatient Hospital Stay (HOSPITAL_BASED_OUTPATIENT_CLINIC_OR_DEPARTMENT_OTHER): Payer: 59 | Admitting: Hematology

## 2023-05-09 ENCOUNTER — Other Ambulatory Visit: Payer: Self-pay

## 2023-05-09 ENCOUNTER — Ambulatory Visit: Payer: 59

## 2023-05-09 ENCOUNTER — Other Ambulatory Visit: Payer: 59

## 2023-05-09 VITALS — BP 161/81 | HR 54 | Temp 97.7°F | Resp 20 | Wt 149.1 lb

## 2023-05-09 DIAGNOSIS — C9000 Multiple myeloma not having achieved remission: Secondary | ICD-10-CM | POA: Diagnosis not present

## 2023-05-09 DIAGNOSIS — C9002 Multiple myeloma in relapse: Secondary | ICD-10-CM | POA: Diagnosis not present

## 2023-05-09 DIAGNOSIS — Z95828 Presence of other vascular implants and grafts: Secondary | ICD-10-CM

## 2023-05-09 DIAGNOSIS — Z5112 Encounter for antineoplastic immunotherapy: Secondary | ICD-10-CM | POA: Diagnosis not present

## 2023-05-09 DIAGNOSIS — Z79899 Other long term (current) drug therapy: Secondary | ICD-10-CM | POA: Diagnosis not present

## 2023-05-09 DIAGNOSIS — C9001 Multiple myeloma in remission: Secondary | ICD-10-CM

## 2023-05-09 DIAGNOSIS — Z79620 Long term (current) use of immunosuppressive biologic: Secondary | ICD-10-CM | POA: Diagnosis not present

## 2023-05-09 LAB — CBC WITH DIFFERENTIAL (CANCER CENTER ONLY)
Abs Immature Granulocytes: 0.01 10*3/uL (ref 0.00–0.07)
Basophils Absolute: 0 10*3/uL (ref 0.0–0.1)
Basophils Relative: 0 %
Eosinophils Absolute: 0 10*3/uL (ref 0.0–0.5)
Eosinophils Relative: 0 %
HCT: 35 % — ABNORMAL LOW (ref 39.0–52.0)
Hemoglobin: 12.1 g/dL — ABNORMAL LOW (ref 13.0–17.0)
Immature Granulocytes: 0 %
Lymphocytes Relative: 44 %
Lymphs Abs: 2.1 10*3/uL (ref 0.7–4.0)
MCH: 33.3 pg (ref 26.0–34.0)
MCHC: 34.6 g/dL (ref 30.0–36.0)
MCV: 96.4 fL (ref 80.0–100.0)
Monocytes Absolute: 0.4 10*3/uL (ref 0.1–1.0)
Monocytes Relative: 8 %
Neutro Abs: 2.3 10*3/uL (ref 1.7–7.7)
Neutrophils Relative %: 48 %
Platelet Count: 156 10*3/uL (ref 150–400)
RBC: 3.63 MIL/uL — ABNORMAL LOW (ref 4.22–5.81)
RDW: 11.9 % (ref 11.5–15.5)
WBC Count: 4.9 10*3/uL (ref 4.0–10.5)
nRBC: 0 % (ref 0.0–0.2)

## 2023-05-09 LAB — CMP (CANCER CENTER ONLY)
ALT: 20 U/L (ref 0–44)
AST: 34 U/L (ref 15–41)
Albumin: 4.2 g/dL (ref 3.5–5.0)
Alkaline Phosphatase: 67 U/L (ref 38–126)
Anion gap: 4 — ABNORMAL LOW (ref 5–15)
BUN: 10 mg/dL (ref 8–23)
CO2: 28 mmol/L (ref 22–32)
Calcium: 9.4 mg/dL (ref 8.9–10.3)
Chloride: 103 mmol/L (ref 98–111)
Creatinine: 0.77 mg/dL (ref 0.61–1.24)
GFR, Estimated: >60 mL/min (ref >60)
Glucose, Bld: 103 mg/dL — ABNORMAL HIGH (ref 70–99)
Potassium: 4.1 mmol/L (ref 3.5–5.1)
Sodium: 135 mmol/L (ref 135–145)
Total Bilirubin: 0.6 mg/dL (ref 0.3–1.2)
Total Protein: 7.5 g/dL (ref 6.5–8.1)

## 2023-05-09 MED ORDER — HEPARIN SOD (PORK) LOCK FLUSH 100 UNIT/ML IV SOLN
500.0000 [IU] | Freq: Once | INTRAVENOUS | Status: AC | PRN
  Administered 2023-05-09: 500 [IU]

## 2023-05-09 MED ORDER — SODIUM CHLORIDE 0.9% FLUSH
10.0000 mL | INTRAVENOUS | Status: DC | PRN
  Administered 2023-05-09: 10 mL

## 2023-05-09 MED ORDER — DEXAMETHASONE 4 MG PO TABS
8.0000 mg | ORAL_TABLET | Freq: Once | ORAL | Status: AC
  Administered 2023-05-09: 8 mg via ORAL
  Filled 2023-05-09: qty 2

## 2023-05-09 MED ORDER — ACETAMINOPHEN 325 MG PO TABS
650.0000 mg | ORAL_TABLET | Freq: Once | ORAL | Status: AC
  Administered 2023-05-09: 650 mg via ORAL
  Filled 2023-05-09: qty 2

## 2023-05-09 MED ORDER — DARATUMUMAB-HYALURONIDASE-FIHJ 1800-30000 MG-UT/15ML ~~LOC~~ SOLN
1800.0000 mg | Freq: Once | SUBCUTANEOUS | Status: AC
  Administered 2023-05-09: 1800 mg via SUBCUTANEOUS
  Filled 2023-05-09: qty 15

## 2023-05-09 MED ORDER — DIPHENHYDRAMINE HCL 25 MG PO CAPS
50.0000 mg | ORAL_CAPSULE | Freq: Once | ORAL | Status: AC
  Administered 2023-05-09: 50 mg via ORAL
  Filled 2023-05-09: qty 2

## 2023-05-09 NOTE — Patient Instructions (Addendum)
You can take Senakot S or Miralax for you constipation   [facility]  Discharge Instructions: Thank you for choosing [facility] to provide your oncology and hematology care.   If you have a lab appointment with the Cancer Center, please go directly to the Cancer Center and check in at the registration area.   Wear comfortable clothing and clothing appropriate for easy access to any Portacath or PICC line.   We strive to give you quality time with your provider. You may need to reschedule your appointment if you arrive late (15 or more minutes).  Arriving late affects you and other patients whose appointments are after yours.  Also, if you miss three or more appointments without notifying the office, you may be dismissed from the clinic at the provider's discretion.      For prescription refill requests, have your pharmacy contact our office and allow 72 hours for refills to be completed.    Today you received the following chemotherapy and/or immunotherapy agents: Darzalex Faspro     To help prevent nausea and vomiting after your treatment, we encourage you to take your nausea medication as directed.  BELOW ARE SYMPTOMS THAT SHOULD BE REPORTED IMMEDIATELY: *FEVER GREATER THAN 100.4 F (38 C) OR HIGHER *CHILLS OR SWEATING *NAUSEA AND VOMITING THAT IS NOT CONTROLLED WITH YOUR NAUSEA MEDICATION *UNUSUAL SHORTNESS OF BREATH *UNUSUAL BRUISING OR BLEEDING *URINARY PROBLEMS (pain or burning when urinating, or frequent urination) *BOWEL PROBLEMS (unusual diarrhea, constipation, pain near the anus) TENDERNESS IN MOUTH AND THROAT WITH OR WITHOUT PRESENCE OF ULCERS (sore throat, sores in mouth, or a toothache) UNUSUAL RASH, SWELLING OR PAIN  UNUSUAL VAGINAL DISCHARGE OR ITCHING   Items with * indicate a potential emergency and should be followed up as soon as possible or go to the Emergency Department if any problems should occur.  Please show  the CHEMOTHERAPY ALERT CARD or IMMUNOTHERAPY ALERT CARD at check-in to the Emergency Department and triage nurse.  Should you have questions after your visit or need to cancel or reschedule your appointment, please contact [facility]  Dept: [phone]  and follow the prompts.  Office hours are 8:00 a.m. to 4:30 p.m. Monday - Friday. Please note that voicemails left after 4:00 p.m. may not be returned until the following business day.  We are closed weekends and major holidays. You have access to a nurse at all times for urgent questions. Please call the main number to the clinic Dept: [phone] and follow the prompts.   For any non-urgent questions, you may also contact your provider using MyChart. We now offer e-Visits for anyone 26 and older to request care online for non-urgent symptoms. For details visit [email or web address].   Also download the MyChart app! Go to the app store, search "MyChart", open the app, select , and log in with your MyChart username and password.
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
## 2023-05-13 ENCOUNTER — Other Ambulatory Visit (HOSPITAL_COMMUNITY): Payer: Self-pay

## 2023-05-14 LAB — MULTIPLE MYELOMA PANEL, SERUM
Albumin SerPl Elph-Mcnc: 3.9 g/dL (ref 2.9–4.4)
Albumin/Glob SerPl: 1.3 (ref 0.7–1.7)
Alpha 1: 0.2 g/dL (ref 0.0–0.4)
Alpha2 Glob SerPl Elph-Mcnc: 0.5 g/dL (ref 0.4–1.0)
B-Globulin SerPl Elph-Mcnc: 0.8 g/dL (ref 0.7–1.3)
Gamma Glob SerPl Elph-Mcnc: 1.6 g/dL (ref 0.4–1.8)
Globulin, Total: 3.1 g/dL (ref 2.2–3.9)
IgA: 18 mg/dL — ABNORMAL LOW (ref 61–437)
IgG (Immunoglobin G), Serum: 1845 mg/dL — ABNORMAL HIGH (ref 603–1613)
IgM (Immunoglobulin M), Srm: 17 mg/dL — ABNORMAL LOW (ref 20–172)
M Protein SerPl Elph-Mcnc: 1.4 g/dL — ABNORMAL HIGH
Total Protein ELP: 7 g/dL (ref 6.0–8.5)

## 2023-05-15 ENCOUNTER — Encounter: Payer: Self-pay | Admitting: Hematology

## 2023-05-20 ENCOUNTER — Encounter: Payer: Self-pay | Admitting: Hematology

## 2023-05-23 ENCOUNTER — Ambulatory Visit: Payer: 59 | Admitting: Hematology

## 2023-05-23 ENCOUNTER — Other Ambulatory Visit: Payer: 59

## 2023-05-23 ENCOUNTER — Ambulatory Visit: Payer: 59

## 2023-05-23 ENCOUNTER — Inpatient Hospital Stay: Payer: 59 | Attending: Oncology

## 2023-05-23 ENCOUNTER — Inpatient Hospital Stay: Payer: 59

## 2023-05-23 VITALS — BP 170/82 | HR 60 | Temp 98.6°F | Resp 18 | Wt 150.0 lb

## 2023-05-23 DIAGNOSIS — Z95828 Presence of other vascular implants and grafts: Secondary | ICD-10-CM

## 2023-05-23 DIAGNOSIS — C9001 Multiple myeloma in remission: Secondary | ICD-10-CM

## 2023-05-23 DIAGNOSIS — Z79899 Other long term (current) drug therapy: Secondary | ICD-10-CM | POA: Diagnosis not present

## 2023-05-23 DIAGNOSIS — Z5112 Encounter for antineoplastic immunotherapy: Secondary | ICD-10-CM | POA: Insufficient documentation

## 2023-05-23 DIAGNOSIS — Z79620 Long term (current) use of immunosuppressive biologic: Secondary | ICD-10-CM | POA: Diagnosis not present

## 2023-05-23 DIAGNOSIS — C9000 Multiple myeloma not having achieved remission: Secondary | ICD-10-CM | POA: Insufficient documentation

## 2023-05-23 LAB — COMPREHENSIVE METABOLIC PANEL
ALT: 15 U/L (ref 0–44)
AST: 16 U/L (ref 15–41)
Albumin: 4.1 g/dL (ref 3.5–5.0)
Alkaline Phosphatase: 71 U/L (ref 38–126)
Anion gap: 4 — ABNORMAL LOW (ref 5–15)
BUN: 11 mg/dL (ref 8–23)
CO2: 28 mmol/L (ref 22–32)
Calcium: 8.8 mg/dL — ABNORMAL LOW (ref 8.9–10.3)
Chloride: 102 mmol/L (ref 98–111)
Creatinine, Ser: 1.08 mg/dL (ref 0.61–1.24)
GFR, Estimated: >60 mL/min (ref >60)
Glucose, Bld: 119 mg/dL — ABNORMAL HIGH (ref 70–99)
Potassium: 3.8 mmol/L (ref 3.5–5.1)
Sodium: 134 mmol/L — ABNORMAL LOW (ref 135–145)
Total Bilirubin: 0.4 mg/dL (ref <1.2)
Total Protein: 7.6 g/dL (ref 6.5–8.1)

## 2023-05-23 LAB — CBC WITH DIFFERENTIAL (CANCER CENTER ONLY)
Abs Immature Granulocytes: 0.01 10*3/uL (ref 0.00–0.07)
Basophils Absolute: 0 10*3/uL (ref 0.0–0.1)
Basophils Relative: 0 %
Eosinophils Absolute: 0 10*3/uL (ref 0.0–0.5)
Eosinophils Relative: 1 %
HCT: 33.8 % — ABNORMAL LOW (ref 39.0–52.0)
Hemoglobin: 11.6 g/dL — ABNORMAL LOW (ref 13.0–17.0)
Immature Granulocytes: 0 %
Lymphocytes Relative: 25 %
Lymphs Abs: 1.5 10*3/uL (ref 0.7–4.0)
MCH: 33.4 pg (ref 26.0–34.0)
MCHC: 34.3 g/dL (ref 30.0–36.0)
MCV: 97.4 fL (ref 80.0–100.0)
Monocytes Absolute: 0.3 10*3/uL (ref 0.1–1.0)
Monocytes Relative: 5 %
Neutro Abs: 4.4 10*3/uL (ref 1.7–7.7)
Neutrophils Relative %: 69 %
Platelet Count: 144 10*3/uL — ABNORMAL LOW (ref 150–400)
RBC: 3.47 MIL/uL — ABNORMAL LOW (ref 4.22–5.81)
RDW: 12 % (ref 11.5–15.5)
WBC Count: 6.3 10*3/uL (ref 4.0–10.5)
nRBC: 0 % (ref 0.0–0.2)

## 2023-05-23 MED ORDER — HEPARIN SOD (PORK) LOCK FLUSH 100 UNIT/ML IV SOLN: 500.0000 [IU] | Freq: Once | INTRAVENOUS | Status: DC | PRN

## 2023-05-23 MED ORDER — SODIUM CHLORIDE 0.9% FLUSH
10.0000 mL | INTRAVENOUS | Status: DC | PRN
  Administered 2023-05-23: 10 mL

## 2023-05-23 MED ORDER — DEXAMETHASONE 4 MG PO TABS
8.0000 mg | ORAL_TABLET | Freq: Once | ORAL | Status: AC
  Administered 2023-05-23: 8 mg via ORAL
  Filled 2023-05-23: qty 2

## 2023-05-23 MED ORDER — ACETAMINOPHEN 325 MG PO TABS
650.0000 mg | ORAL_TABLET | Freq: Once | ORAL | Status: AC
  Administered 2023-05-23: 650 mg via ORAL
  Filled 2023-05-23: qty 2

## 2023-05-23 MED ORDER — DARATUMUMAB-HYALURONIDASE-FIHJ 1800-30000 MG-UT/15ML ~~LOC~~ SOLN
1800.0000 mg | Freq: Once | SUBCUTANEOUS | Status: AC
  Administered 2023-05-23: 1800 mg via SUBCUTANEOUS
  Filled 2023-05-23: qty 15

## 2023-05-23 MED ORDER — SODIUM CHLORIDE 0.9% FLUSH: 10.0000 mL | INTRAVENOUS | Status: DC | PRN

## 2023-05-23 MED ORDER — DIPHENHYDRAMINE HCL 25 MG PO CAPS
50.0000 mg | ORAL_CAPSULE | Freq: Once | ORAL | Status: AC
  Administered 2023-05-23: 50 mg via ORAL
  Filled 2023-05-23: qty 2

## 2023-05-23 NOTE — Patient Instructions (Signed)
You can take Senakot S or Miralax for you constipation   [facility] - A DEPT OF [facility]  Discharge Instructions: Thank you for choosing [facility] to provide your oncology and hematology care.   If you have a lab appointment with the Cancer Center, please go directly to the Cancer Center and check in at the registration area.   Wear comfortable clothing and clothing appropriate for easy access to any Portacath or PICC line.   We strive to give you quality time with your provider. You may need to reschedule your appointment if you arrive late (15 or more minutes).  Arriving late affects you and other patients whose appointments are after yours.  Also, if you miss three or more appointments without notifying the office, you may be dismissed from the clinic at the provider's discretion.      For prescription refill requests, have your pharmacy contact our office and allow 72 hours for refills to be completed.    Today you received the following chemotherapy and/or immunotherapy agents: Darzalex Faspro     To help prevent nausea and vomiting after your treatment, we encourage you to take your nausea medication as directed.  BELOW ARE SYMPTOMS THAT SHOULD BE REPORTED IMMEDIATELY: *FEVER GREATER THAN 100.4 F (38 C) OR HIGHER *CHILLS OR SWEATING *NAUSEA AND VOMITING THAT IS NOT CONTROLLED WITH YOUR NAUSEA MEDICATION *UNUSUAL SHORTNESS OF BREATH *UNUSUAL BRUISING OR BLEEDING *URINARY PROBLEMS (pain or burning when urinating, or frequent urination) *BOWEL PROBLEMS (unusual diarrhea, constipation, pain near the anus) TENDERNESS IN MOUTH AND THROAT WITH OR WITHOUT PRESENCE OF ULCERS (sore throat, sores in mouth, or a toothache) UNUSUAL RASH, SWELLING OR PAIN  UNUSUAL VAGINAL DISCHARGE OR ITCHING   Items with * indicate a potential emergency and should be followed up as soon as possible or go to the Emergency Department if any problems should  occur.  Please show the CHEMOTHERAPY ALERT CARD or IMMUNOTHERAPY ALERT CARD at check-in to the Emergency Department and triage nurse.  Should you have questions after your visit or need to cancel or reschedule your appointment, please contact [facility] - A DEPT OF [facility]  Dept: [phone]  and follow the prompts.  Office hours are 8:00 a.m. to 4:30 p.m. Monday - Friday. Please note that voicemails left after 4:00 p.m. may not be returned until the following business day.  We are closed weekends and major holidays. You have access to a nurse at all times for urgent questions. Please call the main number to the clinic Dept: [phone] and follow the prompts.   For any non-urgent questions, you may also contact your provider using MyChart. We now offer e-Visits for anyone 4 and older to request care online for non-urgent symptoms. For details visit [email or web address].   Also download the MyChart app! Go to the app store, search "MyChart", open the app, select [place], and log in with your MyChart username and password.

## 2023-05-25 ENCOUNTER — Emergency Department (HOSPITAL_COMMUNITY)
Admission: EM | Admit: 2023-05-25 | Discharge: 2023-05-25 | Disposition: A | Discharge status: Home / Self Care | Payer: 59 | Attending: Emergency Medicine | Admitting: Emergency Medicine

## 2023-05-25 ENCOUNTER — Other Ambulatory Visit: Payer: Self-pay

## 2023-05-25 ENCOUNTER — Emergency Department (HOSPITAL_COMMUNITY): Payer: 59

## 2023-05-25 ENCOUNTER — Encounter (HOSPITAL_COMMUNITY): Payer: Self-pay

## 2023-05-25 DIAGNOSIS — Z1152 Encounter for screening for COVID-19: Secondary | ICD-10-CM | POA: Diagnosis not present

## 2023-05-25 DIAGNOSIS — J40 Bronchitis, not specified as acute or chronic: Secondary | ICD-10-CM | POA: Insufficient documentation

## 2023-05-25 DIAGNOSIS — Z87891 Personal history of nicotine dependence: Secondary | ICD-10-CM | POA: Diagnosis not present

## 2023-05-25 DIAGNOSIS — I10 Essential (primary) hypertension: Secondary | ICD-10-CM | POA: Insufficient documentation

## 2023-05-25 DIAGNOSIS — Z452 Encounter for adjustment and management of vascular access device: Secondary | ICD-10-CM | POA: Diagnosis not present

## 2023-05-25 DIAGNOSIS — R059 Cough, unspecified: Secondary | ICD-10-CM | POA: Diagnosis not present

## 2023-05-25 LAB — CBC WITH DIFFERENTIAL/PLATELET
Abs Immature Granulocytes: 0 10*3/uL (ref 0.00–0.07)
Basophils Absolute: 0 10*3/uL (ref 0.0–0.1)
Basophils Relative: 1 %
Eosinophils Absolute: 0 10*3/uL (ref 0.0–0.5)
Eosinophils Relative: 1 %
HCT: 33.5 % — ABNORMAL LOW (ref 39.0–52.0)
Hemoglobin: 11.4 g/dL — ABNORMAL LOW (ref 13.0–17.0)
Immature Granulocytes: 0 %
Lymphocytes Relative: 35 %
Lymphs Abs: 1.5 10*3/uL (ref 0.7–4.0)
MCH: 33.1 pg (ref 26.0–34.0)
MCHC: 34 g/dL (ref 30.0–36.0)
MCV: 97.4 fL (ref 80.0–100.0)
Monocytes Absolute: 0.5 10*3/uL (ref 0.1–1.0)
Monocytes Relative: 10 %
Neutro Abs: 2.3 10*3/uL (ref 1.7–7.7)
Neutrophils Relative %: 53 %
Platelets: 163 10*3/uL (ref 150–400)
RBC: 3.44 MIL/uL — ABNORMAL LOW (ref 4.22–5.81)
RDW: 12.1 % (ref 11.5–15.5)
WBC: 4.3 10*3/uL (ref 4.0–10.5)
nRBC: 0 % (ref 0.0–0.2)

## 2023-05-25 LAB — BASIC METABOLIC PANEL
Anion gap: 7 (ref 5–15)
BUN: 9 mg/dL (ref 8–23)
CO2: 27 mmol/L (ref 22–32)
Calcium: 9.1 mg/dL (ref 8.9–10.3)
Chloride: 102 mmol/L (ref 98–111)
Creatinine, Ser: 0.82 mg/dL (ref 0.61–1.24)
GFR, Estimated: >60 mL/min (ref >60)
Glucose, Bld: 86 mg/dL (ref 70–99)
Potassium: 4.1 mmol/L (ref 3.5–5.1)
Sodium: 136 mmol/L (ref 135–145)

## 2023-05-25 LAB — RESP PANEL BY RT-PCR (RSV, FLU A&B, COVID)  RVPGX2
Influenza A by PCR: NEGATIVE
Influenza B by PCR: NEGATIVE
Resp Syncytial Virus by PCR: NEGATIVE
SARS Coronavirus 2 by RT PCR: NEGATIVE

## 2023-05-25 MED ORDER — BENZONATATE 100 MG PO CAPS
100.0000 mg | ORAL_CAPSULE | Freq: Three times a day (TID) | ORAL | 0 refills | Status: DC
  Filled 2023-05-25: qty 21, 7d supply, fill #0

## 2023-05-25 MED ORDER — DEXAMETHASONE 4 MG PO TABS
4.0000 mg | ORAL_TABLET | Freq: Once | ORAL | Status: AC
  Administered 2023-05-25: 4 mg via ORAL
  Filled 2023-05-25: qty 1

## 2023-05-25 NOTE — ED Provider Notes (Signed)
[place] EMERGENCY DEPARTMENT AT [facility] Provider Note   CSN: [id number] Arrival date & time: 05/25/23  1443     History  Chief Complaint  Patient presents with   Cough   Pain Management    Travis Nguyen is a 72 y.o. male with history of hypertension, multiple myeloma, chronic back pain, presents with concern for cough that has been ongoing for past 2 weeks.  Travis Nguyen reports the past couple of days Travis Nguyen has also been coughing up green phlegm.  Denies any fever or chills.  Reports some nasal congestion and runny nose.  Denies any known sick contacts.   Cough      Home Medications Prior to Admission medications   Medication Sig Start Date End Date Taking? Authorizing Provider  acyclovir (ZOVIRAX) 400 MG tablet Take 1 tablet (400 mg total) by mouth 2 (two) times daily. Patient not taking: Reported on 02/14/2023 07/01/22   Benjiman Core, MD  amLODipine (NORVASC) 10 MG tablet Take 1 tablet by mouth once a day Patient not taking: Reported on 08/30/2022 04/24/21     amLODipine (NORVASC) 5 MG tablet Take 1 tablet (5 mg total) by mouth daily. Patient not taking: Reported on 08/30/2022 02/25/22     amLODipine (NORVASC) 5 MG tablet Take 1 tablet (5 mg total) by mouth daily. 08/12/22     amLODipine (NORVASC) 5 MG tablet Take 1 tablet (5 mg total) by mouth daily. 03/13/23     benzonatate (TESSALON) 100 MG capsule Take 1 capsule (100 mg total) by mouth every 8 (eight) hours. 05/25/23  Yes Arabella Merles, PA-C  bisacodyl 5 MG EC tablet Take by mouth as directed. Patient not taking: Reported on 02/14/2023 10/17/22   [name], MD  Cyanocobalamin (VITAMIN B12 PO) Take by mouth daily.    [provider]  cyclobenzaprine (FLEXERIL) 5 MG tablet Take 1 - 2 tablets by mouth at bedtime as needed Patient not taking: Reported on 10/09/2022 05/20/22     diazepam (VALIUM) 5 MG tablet Take 1 tablet (5 mg total) by mouth every 12 (twelve) hours as needed for muscle spasms. 03/31/23    [name], DO  escitalopram (LEXAPRO) 10 MG tablet Take 1 tablet by mouth once a day Patient not taking: Reported on 08/30/2022 04/24/21     escitalopram (LEXAPRO) 10 MG tablet Take 1 tablet by mouth once daily Patient not taking: Reported on 08/30/2022 07/24/21     escitalopram (LEXAPRO) 10 MG tablet Take 1 tablet (10 mg total) by mouth daily. Patient not taking: Reported on 08/30/2022 02/25/22     escitalopram (LEXAPRO) 10 MG tablet Take 1 tablet (10 mg total) by mouth daily. Patient not taking: Reported on 02/14/2023 08/12/22     HYDROmorphone (DILAUDID) 4 MG tablet Take 1 tablet (4 mg) by mouth every 6 hours as needed for severe pain. Patient not taking: Reported on 02/14/2023 01/06/23   [name], MD  lidocaine-prilocaine (EMLA) cream APPLY TOPICALLY TO PORT-A-CATH DAILY AS NEEDED Patient not taking: Reported on 02/14/2023 06/28/22 06/28/23  Benjiman Core, MD  lidocaine-prilocaine (EMLA) cream Apply 1 Application topically once. Prior to port access    [provider]  losartan (COZAAR) 100 MG tablet TAKE 1 TABLET BY MOUTH ONCE A DAY 06/06/20 06/06/21  [name], MD  losartan (COZAAR) 100 MG tablet Take 1 tablet by mouth once a day Patient not taking: Reported on 08/30/2022 04/24/21     losartan (COZAAR) 100 MG tablet Take 1 tablet by mouth  daily Patient not taking: Reported on 08/30/2022 07/24/21     losartan (COZAAR) 100 MG tablet Take 1 tablet (100 mg total) by mouth daily. Patient not taking: Reported on 02/14/2023 02/25/22     losartan (COZAAR) 100 MG tablet Take 1 tablet (100 mg total) by mouth daily. Patient not taking: Reported on 08/30/2022 08/12/22     losartan (COZAAR) 100 MG tablet Take 1 tablet (100 mg total) by mouth daily. 03/13/23     methocarbamol (ROBAXIN) 500 MG tablet Take 1 tablet (500 mg) by mouth every 8 hours as needed for muscle spasms. Patient not taking: Reported on 03/11/2023 03/01/23   Long, [name], MD  methocarbamol (ROBAXIN) 500 MG tablet Take 1  tablet (500 mg total) by mouth 2 (two) times daily. 04/01/23   Mannie Stabile, PA-C  naproxen (NAPROSYN) 500 MG tablet Take 1 tablet (500 mg total) by mouth 2 (two) times daily. 04/29/23   Rexford Maus, DO  oxyCODONE-acetaminophen (PERCOCET) 10-325 MG tablet Take 1 tablet by mouth 2 times a day Patient not taking: Reported on 08/30/2022 12/15/20     oxyCODONE-acetaminophen (PERCOCET) 10-325 MG tablet Take 1 tablet by mouth twice daily as needed (07/05/21) Patient not taking: Reported on 02/14/2023 07/02/21     oxyCODONE-acetaminophen (PERCOCET) 10-325 MG tablet Take 1 tablet by mouth 2 (two) times daily as needed. Patient not taking: Reported on 08/30/2022 08/03/21     oxyCODONE-acetaminophen (PERCOCET) 10-325 MG tablet Take 1 tablet by mouth 2 times a day as needed orally (ok to fill 30 days after last refill) Patient not taking: Reported on 08/30/2022 02/25/22     oxyCODONE-acetaminophen (PERCOCET) 10-325 MG tablet Take 1 tablet by mouth 2 (two) times daily as needed. Patient not taking: Reported on 08/30/2022 04/29/22     oxyCODONE-acetaminophen (PERCOCET) 10-325 MG tablet Take 1 tablet by mouth 2 (two) times daily. okay to fill 30 days after last refill Patient not taking: Reported on 08/30/2022 05/22/22     oxyCODONE-acetaminophen (PERCOCET) 10-325 MG tablet Take 1 tablet by mouth 2 (two) times daily as needed. (ok to fill 30 days after last refill) Patient not taking: Reported on 08/30/2022 06/27/22     oxyCODONE-acetaminophen (PERCOCET) 10-325 MG tablet Take 1 tablet by mouth 2 (two) times daily as needed Patient not taking: Reported on 10/09/2022 09/23/22     oxyCODONE-acetaminophen (PERCOCET) 10-325 MG tablet Take 1 tablet by mouth 2 (two) times daily as needed. Patient not taking: Reported on 02/14/2023 11/19/22     oxyCODONE-acetaminophen (PERCOCET) 10-325 MG tablet Take 1 tablet by mouth 2 times daily. Patient not taking: Reported on 02/14/2023 01/14/23     oxyCODONE-acetaminophen (PERCOCET)  10-325 MG tablet Take 1 tablet by mouth 2 (two) times daily as needed. Patient not taking: Reported on 03/11/2023 02/10/23     oxyCODONE-acetaminophen (PERCOCET) 10-325 MG tablet Take 1 tablet by mouth 2 (two) times daily as needed. 03/04/23     oxyCODONE-acetaminophen (PERCOCET) 10-325 MG tablet Take 1 tablet by mouth 2 (two) times daily. 03/13/23     oxyCODONE-acetaminophen (PERCOCET) 10-325 MG tablet Take 1 tablet by mouth 2 (two) times daily as needed. 04/08/23     oxyCODONE-acetaminophen (PERCOCET) 10-325 MG tablet Take 1 tablet by mouth 2 (two) times daily. 05/06/23     pantoprazole (PROTONIX) 20 MG tablet Take 1 tablet (20 mg total) by mouth daily. Patient not taking: Reported on 08/30/2022 01/07/21   Tilden Fossa, MD  polyethylene glycol-electrolytes (NULYTELY) 420 g solution Use as directed. Patient not taking:  Reported on 02/14/2023 10/17/22   [name], MD  senna-docusate (SENOKOT-S) 8.6-50 MG tablet Take 1 tablet by mouth at bedtime as needed for mild constipation. Patient not taking: Reported on 02/14/2023 01/20/23   Long, [name], MD  trimethoprim-polymyxin b (POLYTRIM) ophthalmic solution Place 1 drop into both eyes every 4 (four) hours. Patient not taking: Reported on 10/09/2022 09/28/22   Ernie Avena, MD  vitamin C (ASCORBIC ACID) 500 MG tablet Take 500 mg by mouth daily.    [provider]      Allergies    Aspirin    Review of Systems   Review of Systems  Respiratory:  Positive for cough.     Physical Exam Updated Vital Signs BP (!) 191/114   Pulse 67   Temp 99.3 F (37.4 C)   Resp 17   Ht 6\' 1"  (1.854 m)   Wt 68 kg   SpO2 98%   BMI 19.79 kg/m  Physical Exam Vitals and nursing note reviewed.  Constitutional:      Appearance: Normal appearance.  HENT:     Head: Atraumatic.  Cardiovascular:     Rate and Rhythm: Normal rate and regular rhythm.     Heart sounds: Murmur heard.     Comments: Patient with known murmur Pulmonary:     Effort: Pulmonary  effort is normal.     Comments: Lungs clear to auscultation bilaterally Abdominal:     General: Abdomen is flat.     Palpations: Abdomen is soft.  Skin:    General: Skin is warm and dry.  Neurological:     General: No focal deficit present.     Mental Status: Travis Nguyen is alert.  Psychiatric:        Mood and Affect: Mood normal.        Behavior: Behavior normal.     ED Results / Procedures / Treatments   Labs (all labs ordered are listed, but only abnormal results are displayed) Labs Reviewed  CBC WITH DIFFERENTIAL/PLATELET - Abnormal; Notable for the following components:      Result Value   RBC 3.44 (*)    Hemoglobin 11.4 (*)    HCT 33.5 (*)    All other components within normal limits  RESP PANEL BY RT-PCR (RSV, FLU A&B, COVID)  RVPGX2  BASIC METABOLIC PANEL    EKG None  Radiology DG Chest 2 View  Result Date: 05/25/2023 CLINICAL DATA:  Cough for 2 weeks EXAM: CHEST - 2 VIEW COMPARISON:  Chest x-ray 11/30/2021 FINDINGS: Right chest port catheter tip ends in the SVC. The heart size and mediastinal contours are within normal limits. Both lungs are clear. The visualized skeletal structures are unremarkable. IMPRESSION: No active cardiopulmonary disease. Electronically Signed   By: Darliss Cheney M.D.   On: 05/25/2023 17:26    Procedures Procedures    Medications Ordered in ED Medications  dexamethasone (DECADRON) tablet 4 mg (has no administration in time range)    ED Course/ Medical Decision Making/ A&P                                 Medical Decision Making Amount and/or Complexity of Data Reviewed Labs: ordered. Radiology: ordered.  Risk Prescription drug management.     Differential diagnosis includes but is not limited to COVID, flu, RSV, pneumonia, bronchitis  ED Course:  Patient overall well-appearing, no acute distress.  Afebrile, not tachycardic.  98% on room air, talking  in full sentences.  No cough noted on exam.  Lungs clear to auscultation  bilaterally.  Chest x-ray was obtained showed no pneumonia.  Flu, COVID, RSV negative.  CBC was obtained which showed leukocytosis.  BMP unremarkable.  Suspect possible bronchitis as the cause of patient's cough. Patient stable for discharge home at this time. Patient given dose of Decadron prior to discharge per Dr. Andria Meuse to help with cough and his reported chronic back pain   Impression: Bronchitis  Disposition:  The patient was discharged home with instructions to take tessalon as needed for cough.  Follow-up with PCP regarding blood pressure management. Return precautions given.  Lab Tests: I Ordered, and personally interpreted labs.  The pertinent results include:   Flu, COVID, RSV negative  Imaging Studies ordered: I ordered imaging studies including chest x-ray I independently visualized the imaging with scope of interpretation limited to determining acute life threatening conditions related to emergency care. Imaging showed no consolidations, no pleural effusions I agree with the radiologist interpretation                Final Clinical Impression(s) / ED Diagnoses Final diagnoses:  Bronchitis    Rx / DC Orders ED Discharge Orders          Ordered    benzonatate (TESSALON) 100 MG capsule  Every 8 hours        05/25/23 1824              Arabella Merles, PA-C 05/25/23 1830    Anders Simmonds T, DO 05/25/23 2014

## 2023-05-25 NOTE — Discharge Instructions (Addendum)
Your COVID, flu, RSV is negative today.  Your chest x-ray does not show any signs of pneumonia.  You likely have bronchitis which is a viral infection of the lungs that will resolve on its own with time.  It is expected that the cough may last up to a month.  You have been prescribed medication called Tessalon to help with your cough.  You may take this as needed.  Your blood pressure was elevated today.  Please visit your PCP within the next month to discuss blood pressure management.  Return to the ER if you have any fever, difficulty breathing, severe worsening of your cough, any other new or concerning symptoms.

## 2023-05-25 NOTE — ED Triage Notes (Signed)
Patient reports he has had a cold for two weeks and now coughing up green sputum.  Also wants to be seen for his chronic back pain.  Reports he is suppose to see pain management on Wednesday.

## 2023-05-26 ENCOUNTER — Other Ambulatory Visit (HOSPITAL_COMMUNITY): Payer: Self-pay

## 2023-05-26 ENCOUNTER — Encounter: Payer: Self-pay | Admitting: Hematology

## 2023-05-27 ENCOUNTER — Encounter: Payer: Self-pay | Admitting: Hematology

## 2023-05-28 ENCOUNTER — Encounter: Payer: Self-pay | Admitting: Hematology

## 2023-05-28 ENCOUNTER — Other Ambulatory Visit (HOSPITAL_COMMUNITY): Payer: Self-pay

## 2023-05-28 DIAGNOSIS — M545 Low back pain, unspecified: Secondary | ICD-10-CM | POA: Diagnosis not present

## 2023-05-28 DIAGNOSIS — Z79899 Other long term (current) drug therapy: Secondary | ICD-10-CM | POA: Diagnosis not present

## 2023-05-28 DIAGNOSIS — Z5181 Encounter for therapeutic drug level monitoring: Secondary | ICD-10-CM | POA: Diagnosis not present

## 2023-05-28 DIAGNOSIS — M47812 Spondylosis without myelopathy or radiculopathy, cervical region: Secondary | ICD-10-CM | POA: Diagnosis not present

## 2023-05-28 DIAGNOSIS — M1990 Unspecified osteoarthritis, unspecified site: Secondary | ICD-10-CM | POA: Diagnosis not present

## 2023-05-28 DIAGNOSIS — M6281 Muscle weakness (generalized): Secondary | ICD-10-CM | POA: Diagnosis not present

## 2023-05-28 DIAGNOSIS — Z79891 Long term (current) use of opiate analgesic: Secondary | ICD-10-CM | POA: Diagnosis not present

## 2023-05-28 DIAGNOSIS — G894 Chronic pain syndrome: Secondary | ICD-10-CM | POA: Diagnosis not present

## 2023-05-28 DIAGNOSIS — M542 Cervicalgia: Secondary | ICD-10-CM | POA: Diagnosis not present

## 2023-05-28 DIAGNOSIS — G8929 Other chronic pain: Secondary | ICD-10-CM | POA: Diagnosis not present

## 2023-05-28 LAB — MULTIPLE MYELOMA PANEL, SERUM
Albumin SerPl Elph-Mcnc: 3.7 g/dL (ref 2.9–4.4)
Albumin/Glob SerPl: 1.2 (ref 0.7–1.7)
Alpha 1: 0.2 g/dL (ref 0.0–0.4)
Alpha2 Glob SerPl Elph-Mcnc: 0.7 g/dL (ref 0.4–1.0)
B-Globulin SerPl Elph-Mcnc: 0.8 g/dL (ref 0.7–1.3)
Gamma Glob SerPl Elph-Mcnc: 1.6 g/dL (ref 0.4–1.8)
Globulin, Total: 3.3 g/dL (ref 2.2–3.9)
IgA: 16 mg/dL — ABNORMAL LOW (ref 61–437)
IgG (Immunoglobin G), Serum: 1821 mg/dL — ABNORMAL HIGH (ref 603–1613)
IgM (Immunoglobulin M), Srm: 16 mg/dL — ABNORMAL LOW (ref 20–172)
M Protein SerPl Elph-Mcnc: 1.3 g/dL — ABNORMAL HIGH
Total Protein ELP: 7 g/dL (ref 6.0–8.5)

## 2023-05-28 MED ORDER — GABAPENTIN 100 MG PO CAPS
100.0000 mg | ORAL_CAPSULE | Freq: Every day | ORAL | 0 refills | Status: DC
  Filled 2023-05-28: qty 30, 30d supply, fill #0

## 2023-05-28 MED ORDER — METHYLPREDNISOLONE 4 MG PO TBPK
ORAL_TABLET | ORAL | 1 refills | Status: DC
  Filled 2023-05-28: qty 21, 6d supply, fill #0

## 2023-05-29 ENCOUNTER — Other Ambulatory Visit (HOSPITAL_COMMUNITY): Payer: Self-pay

## 2023-05-29 MED ORDER — OXYCODONE-ACETAMINOPHEN 10-325 MG PO TABS
1.0000 | ORAL_TABLET | Freq: Two times a day (BID) | ORAL | 0 refills | Status: DC
  Filled 2023-05-29 – 2023-07-04 (×6): qty 60, 30d supply, fill #0

## 2023-05-30 ENCOUNTER — Other Ambulatory Visit (HOSPITAL_COMMUNITY): Payer: Self-pay

## 2023-06-04 ENCOUNTER — Encounter: Payer: Self-pay | Admitting: Podiatry

## 2023-06-04 ENCOUNTER — Ambulatory Visit (INDEPENDENT_AMBULATORY_CARE_PROVIDER_SITE_OTHER): Payer: 59 | Admitting: Podiatry

## 2023-06-04 DIAGNOSIS — M79675 Pain in left toe(s): Secondary | ICD-10-CM

## 2023-06-04 DIAGNOSIS — B351 Tinea unguium: Secondary | ICD-10-CM

## 2023-06-04 DIAGNOSIS — M79674 Pain in right toe(s): Secondary | ICD-10-CM | POA: Diagnosis not present

## 2023-06-04 NOTE — Progress Notes (Signed)
This patient presents to the office with three complaints.  He presents to the office for nail care and callus care on his left big toe.    He has history of DVT right leg.  He also has had surgery on his big toe joint right foot.   He presents to the office for nail and callus care.    General Appearance  Alert, conversant and in no acute stress.  Vascular  Dorsalis pedis and posterior tibial  pulses are palpable  bilaterally.  Capillary return is within normal limits  bilaterally. Temperature is within normal limits  bilaterally.  Neurologic  Senn-Weinstein monofilament wire test within normal limits  bilaterally. Muscle power within normal limits bilaterally.  Nails Thick disfigured discolored nails with subungual debris  from hallux to fifth toes bilaterally. No evidence of bacterial infection or drainage bilaterally.  Orthopedic  No limitations of motion  feet .  No crepitus or effusions noted.  No bony pathology or digital deformities noted. Hallux rigidis 1st MPJ right foot.  Skin  normotropic skin with no porokeratosis noted bilaterally.  No signs of infections or ulcers noted.   Pinch callus left hallux. Distal clavi 4th toe right foot.  Onychomycosis  B/L.  Pinch callus left hallux.  Hammer toe fifth toe right foot.  Debride nails with nail nipper and dremel tool.  Debride pinch callus left hallux with dremel tool.     Helane Gunther DPM

## 2023-06-06 ENCOUNTER — Other Ambulatory Visit (HOSPITAL_COMMUNITY): Payer: Self-pay

## 2023-06-06 ENCOUNTER — Inpatient Hospital Stay: Payer: 59 | Admitting: Hematology

## 2023-06-06 ENCOUNTER — Inpatient Hospital Stay: Payer: 59

## 2023-06-06 VITALS — BP 156/96 | HR 83 | Temp 99.1°F | Resp 18 | Wt 145.6 lb

## 2023-06-06 DIAGNOSIS — Z95828 Presence of other vascular implants and grafts: Secondary | ICD-10-CM

## 2023-06-06 DIAGNOSIS — C9001 Multiple myeloma in remission: Secondary | ICD-10-CM

## 2023-06-06 DIAGNOSIS — Z5112 Encounter for antineoplastic immunotherapy: Secondary | ICD-10-CM

## 2023-06-06 DIAGNOSIS — Z79899 Other long term (current) drug therapy: Secondary | ICD-10-CM | POA: Diagnosis not present

## 2023-06-06 DIAGNOSIS — Z79620 Long term (current) use of immunosuppressive biologic: Secondary | ICD-10-CM | POA: Diagnosis not present

## 2023-06-06 DIAGNOSIS — C9000 Multiple myeloma not having achieved remission: Secondary | ICD-10-CM | POA: Diagnosis not present

## 2023-06-06 LAB — CMP (CANCER CENTER ONLY)
ALT: 11 U/L (ref 0–44)
AST: 16 U/L (ref 15–41)
Albumin: 4.2 g/dL (ref 3.5–5.0)
Alkaline Phosphatase: 65 U/L (ref 38–126)
Anion gap: 4 — ABNORMAL LOW (ref 5–15)
BUN: 15 mg/dL (ref 8–23)
CO2: 29 mmol/L (ref 22–32)
Calcium: 9.2 mg/dL (ref 8.9–10.3)
Chloride: 104 mmol/L (ref 98–111)
Creatinine: 0.84 mg/dL (ref 0.61–1.24)
GFR, Estimated: >60 mL/min (ref >60)
Glucose, Bld: 110 mg/dL — ABNORMAL HIGH (ref 70–99)
Potassium: 3.8 mmol/L (ref 3.5–5.1)
Sodium: 137 mmol/L (ref 135–145)
Total Bilirubin: 0.5 mg/dL (ref <1.2)
Total Protein: 7.7 g/dL (ref 6.5–8.1)

## 2023-06-06 LAB — CBC WITH DIFFERENTIAL (CANCER CENTER ONLY)
Abs Immature Granulocytes: 0.01 10*3/uL (ref 0.00–0.07)
Basophils Absolute: 0 10*3/uL (ref 0.0–0.1)
Basophils Relative: 0 %
Eosinophils Absolute: 0 10*3/uL (ref 0.0–0.5)
Eosinophils Relative: 0 %
HCT: 35 % — ABNORMAL LOW (ref 39.0–52.0)
Hemoglobin: 12.2 g/dL — ABNORMAL LOW (ref 13.0–17.0)
Immature Granulocytes: 0 %
Lymphocytes Relative: 46 %
Lymphs Abs: 2.1 10*3/uL (ref 0.7–4.0)
MCH: 33.6 pg (ref 26.0–34.0)
MCHC: 34.9 g/dL (ref 30.0–36.0)
MCV: 96.4 fL (ref 80.0–100.0)
Monocytes Absolute: 0.2 10*3/uL (ref 0.1–1.0)
Monocytes Relative: 4 %
Neutro Abs: 2.2 10*3/uL (ref 1.7–7.7)
Neutrophils Relative %: 50 %
Platelet Count: 205 10*3/uL (ref 150–400)
RBC: 3.63 MIL/uL — ABNORMAL LOW (ref 4.22–5.81)
RDW: 11.9 % (ref 11.5–15.5)
WBC Count: 4.5 10*3/uL (ref 4.0–10.5)
nRBC: 0 % (ref 0.0–0.2)

## 2023-06-06 MED ORDER — DARATUMUMAB-HYALURONIDASE-FIHJ 1800-30000 MG-UT/15ML ~~LOC~~ SOLN
1800.0000 mg | Freq: Once | SUBCUTANEOUS | Status: AC
Start: 2023-06-06 — End: 2023-06-06
  Administered 2023-06-06: 1800 mg via SUBCUTANEOUS
  Filled 2023-06-06: qty 15

## 2023-06-06 MED ORDER — ACETAMINOPHEN 325 MG PO TABS
650.0000 mg | ORAL_TABLET | Freq: Once | ORAL | Status: AC
  Administered 2023-06-06: 650 mg via ORAL
  Filled 2023-06-06: qty 2

## 2023-06-06 MED ORDER — HEPARIN SOD (PORK) LOCK FLUSH 100 UNIT/ML IV SOLN
500.0000 [IU] | Freq: Once | INTRAVENOUS | Status: AC | PRN
  Administered 2023-06-06: 500 [IU]

## 2023-06-06 MED ORDER — DEXAMETHASONE 4 MG PO TABS
8.0000 mg | ORAL_TABLET | Freq: Once | ORAL | Status: AC
  Administered 2023-06-06: 8 mg via ORAL
  Filled 2023-06-06: qty 2

## 2023-06-06 MED ORDER — SODIUM CHLORIDE 0.9% FLUSH
10.0000 mL | INTRAVENOUS | Status: DC | PRN
  Administered 2023-06-06: 10 mL

## 2023-06-06 MED ORDER — DIPHENHYDRAMINE HCL 25 MG PO CAPS
50.0000 mg | ORAL_CAPSULE | Freq: Once | ORAL | Status: AC
  Administered 2023-06-06: 50 mg via ORAL
  Filled 2023-06-06: qty 2

## 2023-06-06 NOTE — Patient Instructions (Signed)
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
## 2023-06-06 NOTE — Progress Notes (Signed)
Patient seen by Dr. Kale  Vitals are within treatment parameters.  Labs reviewed: and are within treatment parameters.  Per physician team, patient is ready for treatment and there are NO modifications to the treatment plan.  

## 2023-06-12 ENCOUNTER — Encounter: Payer: Self-pay | Admitting: Hematology

## 2023-06-15 LAB — MULTIPLE MYELOMA PANEL, SERUM
Albumin SerPl Elph-Mcnc: 3.8 g/dL (ref 2.9–4.4)
Albumin/Glob SerPl: 1.1 (ref 0.7–1.7)
Alpha 1: 0.2 g/dL (ref 0.0–0.4)
Alpha2 Glob SerPl Elph-Mcnc: 0.7 g/dL (ref 0.4–1.0)
B-Globulin SerPl Elph-Mcnc: 0.8 g/dL (ref 0.7–1.3)
Gamma Glob SerPl Elph-Mcnc: 1.7 g/dL (ref 0.4–1.8)
Globulin, Total: 3.5 g/dL (ref 2.2–3.9)
IgA: 21 mg/dL — ABNORMAL LOW (ref 61–437)
IgG (Immunoglobin G), Serum: 1963 mg/dL — ABNORMAL HIGH (ref 603–1613)
IgM (Immunoglobulin M), Srm: 19 mg/dL — ABNORMAL LOW (ref 20–172)
M Protein SerPl Elph-Mcnc: 1.4 g/dL — ABNORMAL HIGH
Total Protein ELP: 7.3 g/dL (ref 6.0–8.5)

## 2023-06-23 ENCOUNTER — Other Ambulatory Visit (HOSPITAL_COMMUNITY): Payer: Self-pay

## 2023-06-23 DIAGNOSIS — M542 Cervicalgia: Secondary | ICD-10-CM | POA: Diagnosis not present

## 2023-06-23 DIAGNOSIS — Z79891 Long term (current) use of opiate analgesic: Secondary | ICD-10-CM | POA: Diagnosis not present

## 2023-06-23 DIAGNOSIS — Z5181 Encounter for therapeutic drug level monitoring: Secondary | ICD-10-CM | POA: Diagnosis not present

## 2023-06-23 DIAGNOSIS — G894 Chronic pain syndrome: Secondary | ICD-10-CM | POA: Diagnosis not present

## 2023-06-24 ENCOUNTER — Other Ambulatory Visit (HOSPITAL_COMMUNITY): Payer: Self-pay

## 2023-06-24 MED ORDER — OXYCODONE-ACETAMINOPHEN 10-325 MG PO TABS
1.0000 | ORAL_TABLET | Freq: Two times a day (BID) | ORAL | 0 refills | Status: DC | PRN
  Filled 2023-10-24 – 2023-10-30 (×4): qty 60, 30d supply, fill #0

## 2023-06-25 ENCOUNTER — Other Ambulatory Visit: Payer: Self-pay

## 2023-06-25 ENCOUNTER — Emergency Department (HOSPITAL_COMMUNITY)
Admission: EM | Admit: 2023-06-25 | Discharge: 2023-06-25 | Disposition: A | Discharge status: Home / Self Care | Payer: 59 | Attending: Emergency Medicine | Admitting: Emergency Medicine

## 2023-06-25 ENCOUNTER — Other Ambulatory Visit (HOSPITAL_COMMUNITY): Payer: Self-pay

## 2023-06-25 DIAGNOSIS — M51362 Other intervertebral disc degeneration, lumbar region with discogenic back pain and lower extremity pain: Secondary | ICD-10-CM | POA: Diagnosis not present

## 2023-06-25 DIAGNOSIS — M545 Low back pain, unspecified: Secondary | ICD-10-CM | POA: Diagnosis present

## 2023-06-25 DIAGNOSIS — I10 Essential (primary) hypertension: Secondary | ICD-10-CM | POA: Insufficient documentation

## 2023-06-25 DIAGNOSIS — Z8546 Personal history of malignant neoplasm of prostate: Secondary | ICD-10-CM | POA: Diagnosis not present

## 2023-06-25 DIAGNOSIS — M5441 Lumbago with sciatica, right side: Secondary | ICD-10-CM | POA: Diagnosis not present

## 2023-06-25 DIAGNOSIS — Z79899 Other long term (current) drug therapy: Secondary | ICD-10-CM | POA: Diagnosis not present

## 2023-06-25 DIAGNOSIS — M5442 Lumbago with sciatica, left side: Secondary | ICD-10-CM | POA: Insufficient documentation

## 2023-06-25 DIAGNOSIS — G8929 Other chronic pain: Secondary | ICD-10-CM | POA: Diagnosis not present

## 2023-06-25 MED ORDER — OXYCODONE-ACETAMINOPHEN 7.5-325 MG PO TABS
1.0000 | ORAL_TABLET | Freq: Three times a day (TID) | ORAL | 0 refills | Status: DC
  Filled 2023-06-25: qty 90, 30d supply, fill #0

## 2023-06-25 MED ORDER — MORPHINE SULFATE (PF) 4 MG/ML IV SOLN: 4.0000 mg | Freq: Once | INTRAVENOUS | Status: DC

## 2023-06-25 MED ORDER — MORPHINE SULFATE (PF) 4 MG/ML IV SOLN
4.0000 mg | Freq: Once | INTRAVENOUS | Status: AC
  Administered 2023-06-25: 4 mg via INTRAMUSCULAR
  Filled 2023-06-25: qty 1

## 2023-06-25 MED ORDER — NAPROXEN 500 MG PO TABS
500.0000 mg | ORAL_TABLET | Freq: Two times a day (BID) | ORAL | 0 refills | Status: DC
  Filled 2023-06-25: qty 30, 15d supply, fill #0

## 2023-06-25 MED ORDER — LIDOCAINE 5 % EX PTCH
1.0000 | MEDICATED_PATCH | CUTANEOUS | Status: DC
  Filled 2023-06-25: qty 1

## 2023-06-25 NOTE — ED Notes (Signed)
Pt reports he is ready to go. Celeste, PA informed.

## 2023-06-25 NOTE — Discharge Instructions (Addendum)
It was a pleasure taking care of you at the ER today.  You were seen for your chronic back pain.  As we discussed I cannot prescribe chronic pain medications that are narcotics from the ER but you need to call your doctor tomorrow.

## 2023-06-25 NOTE — ED Triage Notes (Signed)
Pt. Stated, I need something for my back pain, my medicine is not ready til next Thursday. I take Oxycontin 3/16. I just run out cause my pain is so bad.

## 2023-06-25 NOTE — ED Provider Notes (Signed)
[place] EMERGENCY DEPARTMENT AT [facility] Provider Note   CSN: [id number] Arrival date & time: 06/25/23  9518     History  Chief Complaint  Patient presents with   Back Pain    Travis Nguyen is a 72 y.o. male. This a 72 year old male with history of multiple myeloma and prostate cancer in remission, hypertension, chronic back pain.  He presents ER for continued chronic back pain.  He states for the past couple months that has been worse than usual, he ran out of his pain medicines early because he reports that he "is immune" to the pain medicine because he cannot for so long, he is on oxycodone 10 mg.  His next refill is due on 12/19 he reports, he states usually when he comes to the ER he gets a shot of something but states this time he would like to see if we prescribed him something stronger to help get rid of the pain.  Denies change in the pain, denies fevers chills or weight loss, no saddle esthesia or paresthesia, no bowel or bladder incontinence.  He has been referred to pain management but has not seen them yet he reports.   Back Pain      Home Medications Prior to Admission medications   Medication Sig Start Date End Date Taking? Authorizing Provider  acyclovir (ZOVIRAX) 400 MG tablet Take 1 tablet (400 mg total) by mouth 2 (two) times daily. Patient not taking: Reported on 02/14/2023 07/01/22   Benjiman Core, MD  amLODipine (NORVASC) 10 MG tablet Take 1 tablet by mouth once a day Patient not taking: Reported on 08/30/2022 04/24/21     amLODipine (NORVASC) 5 MG tablet Take 1 tablet (5 mg total) by mouth daily. Patient not taking: Reported on 08/30/2022 02/25/22     amLODipine (NORVASC) 5 MG tablet Take 1 tablet (5 mg total) by mouth daily. 08/12/22     amLODipine (NORVASC) 5 MG tablet Take 1 tablet (5 mg total) by mouth daily. 03/13/23     benzonatate (TESSALON) 100 MG capsule Take 1 capsule (100 mg total) by mouth every 8 (eight) hours. 05/25/23    Arabella Merles, PA-C  bisacodyl 5 MG EC tablet Take by mouth as directed. Patient not taking: Reported on 02/14/2023 10/17/22   [name], MD  Cyanocobalamin (VITAMIN B12 PO) Take by mouth daily.    [provider]  cyclobenzaprine (FLEXERIL) 5 MG tablet Take 1 - 2 tablets by mouth at bedtime as needed Patient not taking: Reported on 10/09/2022 05/20/22     diazepam (VALIUM) 5 MG tablet Take 1 tablet (5 mg total) by mouth every 12 (twelve) hours as needed for muscle spasms. 03/31/23   [name], DO  escitalopram (LEXAPRO) 10 MG tablet Take 1 tablet by mouth once a day Patient not taking: Reported on 08/30/2022 04/24/21     escitalopram (LEXAPRO) 10 MG tablet Take 1 tablet by mouth once daily Patient not taking: Reported on 08/30/2022 07/24/21     escitalopram (LEXAPRO) 10 MG tablet Take 1 tablet (10 mg total) by mouth daily. Patient not taking: Reported on 08/30/2022 02/25/22     escitalopram (LEXAPRO) 10 MG tablet Take 1 tablet (10 mg total) by mouth daily. Patient not taking: Reported on 02/14/2023 08/12/22     gabapentin (NEURONTIN) 100 MG capsule Take 1 capsule (100 mg total) by mouth at bedtime for numbness/tingling. 05/28/23     HYDROmorphone (DILAUDID) 4 MG tablet Take 1 tablet (4 mg) by  mouth every 6 hours as needed for severe pain. Patient not taking: Reported on 02/14/2023 01/06/23   [name], MD  lidocaine-prilocaine (EMLA) cream APPLY TOPICALLY TO PORT-A-CATH DAILY AS NEEDED Patient not taking: Reported on 02/14/2023 06/28/22 06/28/23  Benjiman Core, MD  lidocaine-prilocaine (EMLA) cream Apply 1 Application topically once. Prior to port access    [provider]  losartan (COZAAR) 100 MG tablet TAKE 1 TABLET BY MOUTH ONCE A DAY 06/06/20 06/06/21  [name], MD  losartan (COZAAR) 100 MG tablet Take 1 tablet by mouth once a day Patient not taking: Reported on 08/30/2022 04/24/21     losartan (COZAAR) 100 MG tablet Take 1 tablet by mouth daily Patient not  taking: Reported on 08/30/2022 07/24/21     losartan (COZAAR) 100 MG tablet Take 1 tablet (100 mg total) by mouth daily. Patient not taking: Reported on 02/14/2023 02/25/22     losartan (COZAAR) 100 MG tablet Take 1 tablet (100 mg total) by mouth daily. Patient not taking: Reported on 08/30/2022 08/12/22     losartan (COZAAR) 100 MG tablet Take 1 tablet (100 mg total) by mouth daily. 03/13/23     methocarbamol (ROBAXIN) 500 MG tablet Take 1 tablet (500 mg) by mouth every 8 hours as needed for muscle spasms. Patient not taking: Reported on 03/11/2023 03/01/23   Long, [name], MD  methocarbamol (ROBAXIN) 500 MG tablet Take 1 tablet (500 mg total) by mouth 2 (two) times daily. 04/01/23   Mannie Stabile, PA-C  methylPREDNISolone (MEDROL DOSEPAK) 4 MG TBPK tablet Take as directed per package instructions for 6 days for inflammation. 05/28/23     naproxen (NAPROSYN) 500 MG tablet Take 1 tablet (500 mg total) by mouth 2 (two) times daily. 06/25/23   Carmel Sacramento A, PA-C  oxyCODONE-acetaminophen (PERCOCET) 10-325 MG tablet Take 1 tablet by mouth 2 times a day Patient not taking: Reported on 08/30/2022 12/15/20     oxyCODONE-acetaminophen (PERCOCET) 10-325 MG tablet Take 1 tablet by mouth twice daily as needed (07/05/21) Patient not taking: Reported on 02/14/2023 07/02/21     oxyCODONE-acetaminophen (PERCOCET) 10-325 MG tablet Take 1 tablet by mouth 2 (two) times daily as needed. Patient not taking: Reported on 08/30/2022 08/03/21     oxyCODONE-acetaminophen (PERCOCET) 10-325 MG tablet Take 1 tablet by mouth 2 times a day as needed orally (ok to fill 30 days after last refill) Patient not taking: Reported on 08/30/2022 02/25/22     oxyCODONE-acetaminophen (PERCOCET) 10-325 MG tablet Take 1 tablet by mouth 2 (two) times daily as needed. Patient not taking: Reported on 08/30/2022 04/29/22     oxyCODONE-acetaminophen (PERCOCET) 10-325 MG tablet Take 1 tablet by mouth 2 (two) times daily. okay to fill 30 days after last  refill Patient not taking: Reported on 08/30/2022 05/22/22     oxyCODONE-acetaminophen (PERCOCET) 10-325 MG tablet Take 1 tablet by mouth 2 (two) times daily as needed. (ok to fill 30 days after last refill) Patient not taking: Reported on 08/30/2022 06/27/22     oxyCODONE-acetaminophen (PERCOCET) 10-325 MG tablet Take 1 tablet by mouth 2 (two) times daily as needed Patient not taking: Reported on 10/09/2022 09/23/22     oxyCODONE-acetaminophen (PERCOCET) 10-325 MG tablet Take 1 tablet by mouth 2 (two) times daily as needed. Patient not taking: Reported on 02/14/2023 11/19/22     oxyCODONE-acetaminophen (PERCOCET) 10-325 MG tablet Take 1 tablet by mouth 2 times daily. Patient not taking: Reported on 02/14/2023 01/14/23     oxyCODONE-acetaminophen (PERCOCET) 10-325 MG  tablet Take 1 tablet by mouth 2 (two) times daily as needed. Patient not taking: Reported on 03/11/2023 02/10/23     oxyCODONE-acetaminophen (PERCOCET) 10-325 MG tablet Take 1 tablet by mouth 2 (two) times daily as needed. 03/04/23     oxyCODONE-acetaminophen (PERCOCET) 10-325 MG tablet Take 1 tablet by mouth 2 (two) times daily. 03/13/23     oxyCODONE-acetaminophen (PERCOCET) 10-325 MG tablet Take 1 tablet by mouth 2 (two) times daily as needed. 04/08/23     oxyCODONE-acetaminophen (PERCOCET) 10-325 MG tablet Take 1 tablet by mouth 2 (two) times daily. 05/06/23     oxyCODONE-acetaminophen (PERCOCET) 10-325 MG tablet Take 1 tablet by mouth 2 (two) times daily. 05/29/23     oxyCODONE-acetaminophen (PERCOCET) 10-325 MG tablet Take 1 tablet by mouth 2 (two) times daily as needed. 06/24/23     pantoprazole (PROTONIX) 20 MG tablet Take 1 tablet (20 mg total) by mouth daily. Patient not taking: Reported on 08/30/2022 01/07/21   Tilden Fossa, MD  polyethylene glycol-electrolytes (NULYTELY) 420 g solution Use as directed. Patient not taking: Reported on 02/14/2023 10/17/22   [name], MD  senna-docusate (SENOKOT-S) 8.6-50 MG tablet Take 1 tablet by mouth at  bedtime as needed for mild constipation. Patient not taking: Reported on 02/14/2023 01/20/23   Long, [name], MD  trimethoprim-polymyxin b (POLYTRIM) ophthalmic solution Place 1 drop into both eyes every 4 (four) hours. Patient not taking: Reported on 10/09/2022 09/28/22   Ernie Avena, MD  vitamin C (ASCORBIC ACID) 500 MG tablet Take 500 mg by mouth daily.    [provider]      Allergies    Aspirin    Review of Systems   Review of Systems  Musculoskeletal:  Positive for back pain.    Physical Exam Updated Vital Signs BP (!) 191/91 (BP Location: Left Arm)   Pulse (!) 52   Temp 98.1 F (36.7 C)   Resp 17   SpO2 100%  Physical Exam Vitals and nursing note reviewed.  Constitutional:      General: He is not in acute distress.    Appearance: He is well-developed.  HENT:     Head: Normocephalic and atraumatic.  Eyes:     Conjunctiva/sclera: Conjunctivae normal.  Cardiovascular:     Rate and Rhythm: Normal rate and regular rhythm.     Heart sounds: No murmur heard. Pulmonary:     Effort: Pulmonary effort is normal. No respiratory distress.     Breath sounds: Normal breath sounds.  Abdominal:     Palpations: Abdomen is soft.     Tenderness: There is no abdominal tenderness.  Musculoskeletal:        General: No swelling or deformity.     Cervical back: Neck supple.  Skin:    General: Skin is warm and dry.     Capillary Refill: Capillary refill takes less than 2 seconds.  Neurological:     Mental Status: He is alert.     Sensory: No sensory deficit.     Motor: No weakness.     Coordination: Coordination normal.  Psychiatric:        Mood and Affect: Mood normal.        Behavior: Behavior normal.     ED Results / Procedures / Treatments   Labs (all labs ordered are listed, but only abnormal results are displayed) Labs Reviewed - No data to display  EKG None  Radiology No results found.  Procedures Procedures    Medications Ordered in  ED  Medications  lidocaine (LIDODERM) 5 % 1 patch (1 patch Transdermal Not Given 06/25/23 1716)  morphine (PF) 4 MG/ML injection 4 mg (4 mg Intramuscular Given 06/25/23 1718)    ED Course/ Medical Decision Making/ A&P Clinical Course as of 06/25/23 1825  Wed Jun 25, 2023  1713 Patient lying comfortably bed, asking for pain relief.  Still he asking to be sent home with something stronger for pain as well.  Discussed we relieve his pain in the ER and he will be given 1 dose as he is received in the past but are not able to provide further narcotic prescriptions for his chronic pain that is unchanged today as he reports.  He is instructed to call his doctor in the morning to schedule close outpatient follow-up to see if they can refill or change his medication. [CB]    Clinical Course User Index [CB] Ma Rings, PA-C                                 Medical Decision Making DDx: Chronic pain, HNP, degenerative disc disease, bone metastasis, other  ED course: Patient with chronic back pain from degenerative disc disease presents for bilateral low back pain going down both his legs, unchanged from previous, has been here multiple times for this and states he normally gets IM narcotics and he states morphine works well but he is asking for prescription for stronger pain medicine to go home but he states his oxycodone does not work as well as it had in the past, he has not gotten with pain management yet.  Denies saddle esthesia or paresthesia, no bowel or bladder incontinence, no injury or trauma, no fevers chills or weight loss.  He had imaging in September that did not show any new bony lesions and he is having no new symptoms, states pain is very much the same but he ran out of his home pain meds and cannot refill them for 8 more days.  Given 1 dose of pain medicine here and he is feeling better after reevaluation but discussed we cannot refill his chronic narcotic prescriptions. Pt noted to  have very high blood pressure today. They have no symptoms, including no chest pain, SOB, vision change, numbness/tingling/weakness. They were made aware of the value and the need to follow up with primary care in 24-48 hours, and to return immediately if they develop any symptoms.    Risk Prescription drug management.           Final Clinical Impression(s) / ED Diagnoses Final diagnoses:  Chronic bilateral low back pain with bilateral sciatica    Rx / DC Orders ED Discharge Orders          Ordered    naproxen (NAPROSYN) 500 MG tablet  2 times daily        06/25/23 [address] 06/25/23 1825    Laurence Spates, MD 06/26/23 [phone]

## 2023-06-26 ENCOUNTER — Inpatient Hospital Stay: Payer: 59

## 2023-06-26 ENCOUNTER — Inpatient Hospital Stay: Payer: 59 | Attending: Oncology

## 2023-06-26 ENCOUNTER — Other Ambulatory Visit: Payer: Self-pay | Admitting: Hematology

## 2023-06-26 ENCOUNTER — Other Ambulatory Visit: Payer: Self-pay

## 2023-06-26 ENCOUNTER — Encounter: Payer: Self-pay | Admitting: Hematology

## 2023-06-26 ENCOUNTER — Other Ambulatory Visit (HOSPITAL_COMMUNITY): Payer: Self-pay

## 2023-06-26 VITALS — BP 179/90 | HR 64 | Temp 97.7°F | Resp 17 | Wt 150.0 lb

## 2023-06-26 DIAGNOSIS — Z79620 Long term (current) use of immunosuppressive biologic: Secondary | ICD-10-CM | POA: Diagnosis not present

## 2023-06-26 DIAGNOSIS — Z5112 Encounter for antineoplastic immunotherapy: Secondary | ICD-10-CM | POA: Insufficient documentation

## 2023-06-26 DIAGNOSIS — Z95828 Presence of other vascular implants and grafts: Secondary | ICD-10-CM

## 2023-06-26 DIAGNOSIS — C9001 Multiple myeloma in remission: Secondary | ICD-10-CM

## 2023-06-26 DIAGNOSIS — C9000 Multiple myeloma not having achieved remission: Secondary | ICD-10-CM | POA: Diagnosis not present

## 2023-06-26 LAB — CBC WITH DIFFERENTIAL (CANCER CENTER ONLY)
Abs Immature Granulocytes: 0.02 10*3/uL (ref 0.00–0.07)
Basophils Absolute: 0 10*3/uL (ref 0.0–0.1)
Basophils Relative: 0 %
Eosinophils Absolute: 0 10*3/uL (ref 0.0–0.5)
Eosinophils Relative: 0 %
HCT: 35 % — ABNORMAL LOW (ref 39.0–52.0)
Hemoglobin: 12.2 g/dL — ABNORMAL LOW (ref 13.0–17.0)
Immature Granulocytes: 0 %
Lymphocytes Relative: 33 %
Lymphs Abs: 1.5 10*3/uL (ref 0.7–4.0)
MCH: 34 pg (ref 26.0–34.0)
MCHC: 34.9 g/dL (ref 30.0–36.0)
MCV: 97.5 fL (ref 80.0–100.0)
Monocytes Absolute: 0.2 10*3/uL (ref 0.1–1.0)
Monocytes Relative: 4 %
Neutro Abs: 2.8 10*3/uL (ref 1.7–7.7)
Neutrophils Relative %: 63 %
Platelet Count: 193 10*3/uL (ref 150–400)
RBC: 3.59 MIL/uL — ABNORMAL LOW (ref 4.22–5.81)
RDW: 12.3 % (ref 11.5–15.5)
WBC Count: 4.6 10*3/uL (ref 4.0–10.5)
nRBC: 0 % (ref 0.0–0.2)

## 2023-06-26 LAB — CMP (CANCER CENTER ONLY)
ALT: 13 U/L (ref 0–44)
AST: 18 U/L (ref 15–41)
Albumin: 4.3 g/dL (ref 3.5–5.0)
Alkaline Phosphatase: 77 U/L (ref 38–126)
Anion gap: 4 — ABNORMAL LOW (ref 5–15)
BUN: 12 mg/dL (ref 8–23)
CO2: 29 mmol/L (ref 22–32)
Calcium: 9.3 mg/dL (ref 8.9–10.3)
Chloride: 100 mmol/L (ref 98–111)
Creatinine: 0.72 mg/dL (ref 0.61–1.24)
GFR, Estimated: >60 mL/min (ref >60)
Glucose, Bld: 89 mg/dL (ref 70–99)
Potassium: 3.9 mmol/L (ref 3.5–5.1)
Sodium: 133 mmol/L — ABNORMAL LOW (ref 135–145)
Total Bilirubin: 0.5 mg/dL (ref <1.2)
Total Protein: 8.1 g/dL (ref 6.5–8.1)

## 2023-06-26 MED ORDER — DARATUMUMAB-HYALURONIDASE-FIHJ 1800-30000 MG-UT/15ML ~~LOC~~ SOLN
1800.0000 mg | Freq: Once | SUBCUTANEOUS | Status: AC
  Administered 2023-06-26: 1800 mg via SUBCUTANEOUS
  Filled 2023-06-26: qty 15

## 2023-06-26 MED ORDER — ACETAMINOPHEN 325 MG PO TABS
650.0000 mg | ORAL_TABLET | Freq: Once | ORAL | Status: AC
Start: 2023-06-26 — End: 2023-06-26
  Administered 2023-06-26: 650 mg via ORAL
  Filled 2023-06-26: qty 2

## 2023-06-26 MED ORDER — DIPHENHYDRAMINE HCL 25 MG PO CAPS
50.0000 mg | ORAL_CAPSULE | Freq: Once | ORAL | Status: AC
  Administered 2023-06-26: 50 mg via ORAL
  Filled 2023-06-26: qty 2

## 2023-06-26 MED ORDER — DEXAMETHASONE 4 MG PO TABS
8.0000 mg | ORAL_TABLET | Freq: Once | ORAL | Status: AC
Start: 2023-06-26 — End: 2023-06-26
  Administered 2023-06-26: 8 mg via ORAL
  Filled 2023-06-26: qty 2

## 2023-06-26 MED ORDER — LIDOCAINE-PRILOCAINE 2.5-2.5 % EX CREA
TOPICAL_CREAM | CUTANEOUS | 2 refills | Status: DC
  Filled 2023-06-26: qty 30, 7d supply, fill #0
  Filled 2023-11-24: qty 30, 7d supply, fill #1

## 2023-06-26 MED ORDER — SODIUM CHLORIDE 0.9% FLUSH
10.0000 mL | INTRAVENOUS | Status: DC | PRN
Start: 2023-06-26 — End: 2023-06-26
  Administered 2023-06-26: 10 mL

## 2023-06-26 MED ORDER — HEPARIN SOD (PORK) LOCK FLUSH 100 UNIT/ML IV SOLN
500.0000 [IU] | Freq: Once | INTRAVENOUS | Status: AC | PRN
  Administered 2023-06-26: 500 [IU]

## 2023-06-26 NOTE — Patient Instructions (Signed)

## 2023-06-27 ENCOUNTER — Encounter: Payer: Self-pay | Admitting: Hematology

## 2023-06-28 ENCOUNTER — Other Ambulatory Visit: Payer: Self-pay

## 2023-06-28 ENCOUNTER — Other Ambulatory Visit (HOSPITAL_COMMUNITY): Payer: Self-pay

## 2023-07-01 ENCOUNTER — Other Ambulatory Visit (HOSPITAL_COMMUNITY): Payer: Self-pay

## 2023-07-03 ENCOUNTER — Other Ambulatory Visit (HOSPITAL_COMMUNITY): Payer: Self-pay

## 2023-07-04 ENCOUNTER — Other Ambulatory Visit: Payer: 59

## 2023-07-04 ENCOUNTER — Other Ambulatory Visit (HOSPITAL_COMMUNITY): Payer: Self-pay

## 2023-07-04 ENCOUNTER — Ambulatory Visit: Payer: 59

## 2023-07-04 ENCOUNTER — Ambulatory Visit: Payer: 59 | Admitting: Hematology

## 2023-07-04 LAB — MULTIPLE MYELOMA PANEL, SERUM
Albumin SerPl Elph-Mcnc: 4.2 g/dL (ref 2.9–4.4)
Albumin/Glob SerPl: 1.3 (ref 0.7–1.7)
Alpha 1: 0.2 g/dL (ref 0.0–0.4)
Alpha2 Glob SerPl Elph-Mcnc: 0.6 g/dL (ref 0.4–1.0)
B-Globulin SerPl Elph-Mcnc: 0.9 g/dL (ref 0.7–1.3)
Gamma Glob SerPl Elph-Mcnc: 1.8 g/dL (ref 0.4–1.8)
Globulin, Total: 3.5 g/dL (ref 2.2–3.9)
IgA: 21 mg/dL — ABNORMAL LOW (ref 61–437)
IgG (Immunoglobin G), Serum: 2041 mg/dL — ABNORMAL HIGH (ref 603–1613)
IgM (Immunoglobulin M), Srm: 18 mg/dL — ABNORMAL LOW (ref 20–172)
M Protein SerPl Elph-Mcnc: 1.6 g/dL — ABNORMAL HIGH
Total Protein ELP: 7.7 g/dL (ref 6.0–8.5)

## 2023-07-10 ENCOUNTER — Inpatient Hospital Stay: Payer: 59

## 2023-07-10 VITALS — BP 169/86 | HR 61 | Temp 99.2°F | Resp 18

## 2023-07-10 DIAGNOSIS — Z79620 Long term (current) use of immunosuppressive biologic: Secondary | ICD-10-CM | POA: Diagnosis not present

## 2023-07-10 DIAGNOSIS — Z95828 Presence of other vascular implants and grafts: Secondary | ICD-10-CM

## 2023-07-10 DIAGNOSIS — C9000 Multiple myeloma not having achieved remission: Secondary | ICD-10-CM | POA: Diagnosis not present

## 2023-07-10 DIAGNOSIS — C9001 Multiple myeloma in remission: Secondary | ICD-10-CM

## 2023-07-10 DIAGNOSIS — Z5112 Encounter for antineoplastic immunotherapy: Secondary | ICD-10-CM | POA: Diagnosis not present

## 2023-07-10 LAB — CBC WITH DIFFERENTIAL (CANCER CENTER ONLY)
Abs Immature Granulocytes: 0.02 10*3/uL (ref 0.00–0.07)
Basophils Absolute: 0 10*3/uL (ref 0.0–0.1)
Basophils Relative: 0 %
Eosinophils Absolute: 0 10*3/uL (ref 0.0–0.5)
Eosinophils Relative: 1 %
HCT: 33.7 % — ABNORMAL LOW (ref 39.0–52.0)
Hemoglobin: 12 g/dL — ABNORMAL LOW (ref 13.0–17.0)
Immature Granulocytes: 0 %
Lymphocytes Relative: 47 %
Lymphs Abs: 2.5 10*3/uL (ref 0.7–4.0)
MCH: 33.9 pg (ref 26.0–34.0)
MCHC: 35.6 g/dL (ref 30.0–36.0)
MCV: 95.2 fL (ref 80.0–100.0)
Monocytes Absolute: 0.3 10*3/uL (ref 0.1–1.0)
Monocytes Relative: 7 %
Neutro Abs: 2.4 10*3/uL (ref 1.7–7.7)
Neutrophils Relative %: 45 %
Platelet Count: 154 10*3/uL (ref 150–400)
RBC: 3.54 MIL/uL — ABNORMAL LOW (ref 4.22–5.81)
RDW: 12.1 % (ref 11.5–15.5)
WBC Count: 5.2 10*3/uL (ref 4.0–10.5)
nRBC: 0 % (ref 0.0–0.2)

## 2023-07-10 MED ORDER — DEXAMETHASONE 4 MG PO TABS
8.0000 mg | ORAL_TABLET | Freq: Once | ORAL | Status: AC
  Administered 2023-07-10: 8 mg via ORAL
  Filled 2023-07-10: qty 2

## 2023-07-10 MED ORDER — HEPARIN SOD (PORK) LOCK FLUSH 100 UNIT/ML IV SOLN: 500.0000 [IU] | Freq: Once | INTRAVENOUS | Status: DC | PRN

## 2023-07-10 MED ORDER — ACETAMINOPHEN 325 MG PO TABS
650.0000 mg | ORAL_TABLET | Freq: Once | ORAL | Status: AC
  Administered 2023-07-10: 650 mg via ORAL
  Filled 2023-07-10: qty 2

## 2023-07-10 MED ORDER — DIPHENHYDRAMINE HCL 25 MG PO CAPS
50.0000 mg | ORAL_CAPSULE | Freq: Once | ORAL | Status: AC
  Administered 2023-07-10: 50 mg via ORAL
  Filled 2023-07-10: qty 2

## 2023-07-10 MED ORDER — DARATUMUMAB-HYALURONIDASE-FIHJ 1800-30000 MG-UT/15ML ~~LOC~~ SOLN
1800.0000 mg | Freq: Once | SUBCUTANEOUS | Status: AC
Start: 2023-07-10 — End: 2023-07-10
  Administered 2023-07-10: 1800 mg via SUBCUTANEOUS
  Filled 2023-07-10: qty 15

## 2023-07-10 MED ORDER — SODIUM CHLORIDE 0.9% FLUSH
10.0000 mL | INTRAVENOUS | Status: DC | PRN
Start: 2023-07-10 — End: 2023-07-10

## 2023-07-10 NOTE — Patient Instructions (Signed)
[facility] - A DEPT OF [facility]  Discharge Instructions: Thank you for choosing [facility] to provide your oncology and hematology care.   If you have a lab appointment with the Cancer Center, please go directly to the Cancer Center and check in at the registration area.   Wear comfortable clothing and clothing appropriate for easy access to any Portacath or PICC line.   We strive to give you quality time with your provider. You may need to reschedule your appointment if you arrive late (15 or more minutes).  Arriving late affects you and other patients whose appointments are after yours.  Also, if you miss three or more appointments without notifying the office, you may be dismissed from the clinic at the provider's discretion.      For prescription refill requests, have your pharmacy contact our office and allow 72 hours for refills to be completed.    Today you received the following chemotherapy and/or immunotherapy agents Darzalex faspro      To help prevent nausea and vomiting after your treatment, we encourage you to take your nausea medication as directed.  BELOW ARE SYMPTOMS THAT SHOULD BE REPORTED IMMEDIATELY: *FEVER GREATER THAN 100.4 F (38 C) OR HIGHER *CHILLS OR SWEATING *NAUSEA AND VOMITING THAT IS NOT CONTROLLED WITH YOUR NAUSEA MEDICATION *UNUSUAL SHORTNESS OF BREATH *UNUSUAL BRUISING OR BLEEDING *URINARY PROBLEMS (pain or burning when urinating, or frequent urination) *BOWEL PROBLEMS (unusual diarrhea, constipation, pain near the anus) TENDERNESS IN MOUTH AND THROAT WITH OR WITHOUT PRESENCE OF ULCERS (sore throat, sores in mouth, or a toothache) UNUSUAL RASH, SWELLING OR PAIN  UNUSUAL VAGINAL DISCHARGE OR ITCHING   Items with * indicate a potential emergency and should be followed up as soon as possible or go to the Emergency Department if any problems should occur.  Please show the CHEMOTHERAPY ALERT CARD or  IMMUNOTHERAPY ALERT CARD at check-in to the Emergency Department and triage nurse.  Should you have questions after your visit or need to cancel or reschedule your appointment, please contact [facility] - A DE[facility]  Dept: [phone]  and follow the prompts.  Office hours are 8:00 a.m. to 4:30 p.m. Monday - Friday. Please note that voicemails left after 4:00 p.m. may not be returned until the following business day.  We are closed weekends and major holidays. You have access to a nurse at all times for urgent questions. Please call the main number to the clinic Dept: [phone] and follow the prompts.   For any non-urgent questions, you may also contact your provider using MyChart. We now offer e-Visits for anyone 60 and older to request care online for non-urgent symptoms. For details visit [email or web address].   Also download the MyChart app! Go to the app store, search "MyChart", open the app, select [place], and log in with your MyChart username and password.

## 2023-07-20 LAB — MULTIPLE MYELOMA PANEL, SERUM
Albumin SerPl Elph-Mcnc: 3.7 g/dL (ref 2.9–4.4)
Albumin/Glob SerPl: 1 (ref 0.7–1.7)
Alpha 1: 0.2 g/dL (ref 0.0–0.4)
Alpha2 Glob SerPl Elph-Mcnc: 0.9 g/dL (ref 0.4–1.0)
B-Globulin SerPl Elph-Mcnc: 0.9 g/dL (ref 0.7–1.3)
Gamma Glob SerPl Elph-Mcnc: 1.8 g/dL (ref 0.4–1.8)
Globulin, Total: 3.8 g/dL (ref 2.2–3.9)
IgA: 18 mg/dL — ABNORMAL LOW (ref 61–437)
IgG (Immunoglobin G), Serum: 2282 mg/dL — ABNORMAL HIGH (ref 603–1613)
IgM (Immunoglobulin M), Srm: 14 mg/dL — ABNORMAL LOW (ref 20–172)
M Protein SerPl Elph-Mcnc: 1.5 g/dL — ABNORMAL HIGH
Total Protein ELP: 7.5 g/dL (ref 6.0–8.5)

## 2023-07-23 ENCOUNTER — Inpatient Hospital Stay: Payer: 59

## 2023-07-23 ENCOUNTER — Ambulatory Visit: Payer: 59 | Admitting: Hematology
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
## 2023-07-25 ENCOUNTER — Inpatient Hospital Stay (HOSPITAL_BASED_OUTPATIENT_CLINIC_OR_DEPARTMENT_OTHER): Payer: 59 | Admitting: Hematology

## 2023-07-25 ENCOUNTER — Other Ambulatory Visit: Payer: Self-pay

## 2023-07-25 ENCOUNTER — Other Ambulatory Visit: Payer: Self-pay | Admitting: *Deleted

## 2023-07-25 ENCOUNTER — Inpatient Hospital Stay: Payer: 59 | Attending: Oncology

## 2023-07-25 ENCOUNTER — Inpatient Hospital Stay: Payer: 59

## 2023-07-25 VITALS — BP 158/62 | HR 70 | Temp 97.9°F | Resp 17 | Ht 73.0 in | Wt 148.7 lb

## 2023-07-25 DIAGNOSIS — C9001 Multiple myeloma in remission: Secondary | ICD-10-CM

## 2023-07-25 DIAGNOSIS — Z5112 Encounter for antineoplastic immunotherapy: Secondary | ICD-10-CM | POA: Diagnosis not present

## 2023-07-25 DIAGNOSIS — Z87891 Personal history of nicotine dependence: Secondary | ICD-10-CM | POA: Insufficient documentation

## 2023-07-25 DIAGNOSIS — Z79899 Other long term (current) drug therapy: Secondary | ICD-10-CM | POA: Diagnosis not present

## 2023-07-25 DIAGNOSIS — Z8616 Personal history of COVID-19: Secondary | ICD-10-CM | POA: Insufficient documentation

## 2023-07-25 DIAGNOSIS — Z8249 Family history of ischemic heart disease and other diseases of the circulatory system: Secondary | ICD-10-CM | POA: Diagnosis not present

## 2023-07-25 DIAGNOSIS — Z9079 Acquired absence of other genital organ(s): Secondary | ICD-10-CM | POA: Diagnosis not present

## 2023-07-25 DIAGNOSIS — Z85820 Personal history of malignant melanoma of skin: Secondary | ICD-10-CM | POA: Insufficient documentation

## 2023-07-25 DIAGNOSIS — Z886 Allergy status to analgesic agent status: Secondary | ICD-10-CM | POA: Insufficient documentation

## 2023-07-25 DIAGNOSIS — Z8042 Family history of malignant neoplasm of prostate: Secondary | ICD-10-CM | POA: Diagnosis not present

## 2023-07-25 DIAGNOSIS — Z9049 Acquired absence of other specified parts of digestive tract: Secondary | ICD-10-CM | POA: Insufficient documentation

## 2023-07-25 DIAGNOSIS — M549 Dorsalgia, unspecified: Secondary | ICD-10-CM | POA: Diagnosis not present

## 2023-07-25 DIAGNOSIS — Z8546 Personal history of malignant neoplasm of prostate: Secondary | ICD-10-CM | POA: Diagnosis not present

## 2023-07-25 DIAGNOSIS — C9000 Multiple myeloma not having achieved remission: Secondary | ICD-10-CM | POA: Diagnosis not present

## 2023-07-25 DIAGNOSIS — Z95828 Presence of other vascular implants and grafts: Secondary | ICD-10-CM

## 2023-07-25 DIAGNOSIS — G8929 Other chronic pain: Secondary | ICD-10-CM | POA: Insufficient documentation

## 2023-07-25 LAB — CBC WITH DIFFERENTIAL (CANCER CENTER ONLY)
Abs Immature Granulocytes: 0.01 10*3/uL (ref 0.00–0.07)
Basophils Absolute: 0 10*3/uL (ref 0.0–0.1)
Basophils Relative: 0 %
Eosinophils Absolute: 0 10*3/uL (ref 0.0–0.5)
Eosinophils Relative: 0 %
HCT: 35.6 % — ABNORMAL LOW (ref 39.0–52.0)
Hemoglobin: 11.9 g/dL — ABNORMAL LOW (ref 13.0–17.0)
Immature Granulocytes: 0 %
Lymphocytes Relative: 31 %
Lymphs Abs: 1.3 10*3/uL (ref 0.7–4.0)
MCH: 32.1 pg (ref 26.0–34.0)
MCHC: 33.4 g/dL (ref 30.0–36.0)
MCV: 96 fL (ref 80.0–100.0)
Monocytes Absolute: 0.3 10*3/uL (ref 0.1–1.0)
Monocytes Relative: 6 %
Neutro Abs: 2.7 10*3/uL (ref 1.7–7.7)
Neutrophils Relative %: 63 %
Platelet Count: 201 10*3/uL (ref 150–400)
RBC: 3.71 MIL/uL — ABNORMAL LOW (ref 4.22–5.81)
RDW: 13 % (ref 11.5–15.5)
WBC Count: 4.3 10*3/uL (ref 4.0–10.5)
nRBC: 0 % (ref 0.0–0.2)

## 2023-07-25 LAB — CMP (CANCER CENTER ONLY)
ALT: 11 U/L (ref 0–44)
AST: 16 U/L (ref 15–41)
Albumin: 4.1 g/dL (ref 3.5–5.0)
Alkaline Phosphatase: 71 U/L (ref 38–126)
Anion gap: 5 (ref 5–15)
BUN: 18 mg/dL (ref 8–23)
CO2: 24 mmol/L (ref 22–32)
Calcium: 8.7 mg/dL — ABNORMAL LOW (ref 8.9–10.3)
Chloride: 108 mmol/L (ref 98–111)
Creatinine: 0.83 mg/dL (ref 0.61–1.24)
GFR, Estimated: >60 mL/min (ref >60)
Glucose, Bld: 108 mg/dL — ABNORMAL HIGH (ref 70–99)
Potassium: 4.1 mmol/L (ref 3.5–5.1)
Sodium: 137 mmol/L (ref 135–145)
Total Bilirubin: 0.2 mg/dL (ref 0.0–1.2)
Total Protein: 7.8 g/dL (ref 6.5–8.1)

## 2023-07-25 MED ORDER — ACETAMINOPHEN 325 MG PO TABS
650.0000 mg | ORAL_TABLET | Freq: Once | ORAL | Status: AC
  Administered 2023-07-25: 650 mg via ORAL
  Filled 2023-07-25: qty 2

## 2023-07-25 MED ORDER — DEXAMETHASONE 4 MG PO TABS
8.0000 mg | ORAL_TABLET | Freq: Once | ORAL | Status: AC
Start: 2023-07-25 — End: 2023-07-25
  Administered 2023-07-25: 8 mg via ORAL
  Filled 2023-07-25: qty 2

## 2023-07-25 MED ORDER — DIPHENHYDRAMINE HCL 25 MG PO CAPS
50.0000 mg | ORAL_CAPSULE | Freq: Once | ORAL | Status: AC
  Administered 2023-07-25: 50 mg via ORAL
  Filled 2023-07-25: qty 2

## 2023-07-25 MED ORDER — DARATUMUMAB-HYALURONIDASE-FIHJ 1800-30000 MG-UT/15ML ~~LOC~~ SOLN
1800.0000 mg | Freq: Once | SUBCUTANEOUS | Status: AC
Start: 2023-07-25 — End: 2023-07-25
  Administered 2023-07-25: 1800 mg via SUBCUTANEOUS
  Filled 2023-07-25: qty 15

## 2023-07-25 MED ORDER — SODIUM CHLORIDE 0.9% FLUSH
10.0000 mL | INTRAVENOUS | Status: DC | PRN
  Administered 2023-07-25: 10 mL

## 2023-07-25 MED ORDER — HEPARIN SOD (PORK) LOCK FLUSH 100 UNIT/ML IV SOLN
500.0000 [IU] | Freq: Once | INTRAVENOUS | Status: AC | PRN
Start: 2023-07-25 — End: 2023-07-25
  Administered 2023-07-25: 500 [IU]

## 2023-07-25 NOTE — Patient Instructions (Signed)
 [facility] - A DEPT OF [facility]  Discharge Instructions: Thank you for choosing [facility] to provide your oncology and hematology care.   If you have a lab appointment with the Cancer Center, please go directly to the Cancer Center and check in at the registration area.   Wear comfortable clothing and clothing appropriate for easy access to any Portacath or PICC line.   We strive to give you quality time with your provider. You may need to reschedule your appointment if you arrive late (15 or more minutes).  Arriving late affects you and other patients whose appointments are after yours.  Also, if you miss three or more appointments without notifying the office, you may be dismissed from the clinic at the provider's discretion.      For prescription refill requests, have your pharmacy contact our office and allow 72 hours for refills to be completed.    Today you received the following chemotherapy and/or immunotherapy agents: Darzalex Faspro      To help prevent nausea and vomiting after your treatment, we encourage you to take your nausea medication as directed.  BELOW ARE SYMPTOMS THAT SHOULD BE REPORTED IMMEDIATELY: *FEVER GREATER THAN 100.4 F (38 C) OR HIGHER *CHILLS OR SWEATING *NAUSEA AND VOMITING THAT IS NOT CONTROLLED WITH YOUR NAUSEA MEDICATION *UNUSUAL SHORTNESS OF BREATH *UNUSUAL BRUISING OR BLEEDING *URINARY PROBLEMS (pain or burning when urinating, or frequent urination) *BOWEL PROBLEMS (unusual diarrhea, constipation, pain near the anus) TENDERNESS IN MOUTH AND THROAT WITH OR WITHOUT PRESENCE OF ULCERS (sore throat, sores in mouth, or a toothache) UNUSUAL RASH, SWELLING OR PAIN  UNUSUAL VAGINAL DISCHARGE OR ITCHING   Items with * indicate a potential emergency and should be followed up as soon as possible or go to the Emergency Department if any problems should occur.  Please show the CHEMOTHERAPY ALERT CARD or  IMMUNOTHERAPY ALERT CARD at check-in to the Emergency Department and triage nurse.  Should you have questions after your visit or need to cancel or reschedule your appointment, please contact [facility]  Dept: [phone]  and follow the prompts.  Office hours are 8:00 a.m. to 4:30 p.m. Monday - Friday. Please note that voicemails left after 4:00 p.m. may not be returned until the following business day.  We are closed weekends and major holidays. You have access to a nurse at all times for urgent questions. Please call the main number to the clinic Dept: [phone] and follow the prompts.   For any non-urgent questions, you may also contact your provider using MyChart. We now offer e-Visits for anyone 40 and older to request care online for non-urgent symptoms. For details visit [email or web address].   Also download the MyChart app! Go to the app store, search MyChart, open the app, select [facility], and log in with your MyChart username and password.

## 2023-07-25 NOTE — Progress Notes (Signed)
 Patient seen by Dr. Addison Naegeli are within treatment parameters.  Labs reviewed: and are within treatment parameters.  Per physician team, patient is ready for treatment and there are NO modifications to the treatment plan.

## 2023-07-30 ENCOUNTER — Other Ambulatory Visit: Payer: Self-pay

## 2023-07-31 ENCOUNTER — Encounter: Payer: Self-pay | Admitting: Hematology

## 2023-08-01 ENCOUNTER — Ambulatory Visit: Payer: 59 | Admitting: Hematology

## 2023-08-01 ENCOUNTER — Other Ambulatory Visit: Payer: 59

## 2023-08-01 ENCOUNTER — Other Ambulatory Visit (HOSPITAL_COMMUNITY): Payer: Self-pay

## 2023-08-01 ENCOUNTER — Other Ambulatory Visit: Payer: Self-pay

## 2023-08-01 ENCOUNTER — Ambulatory Visit: Payer: 59

## 2023-08-01 LAB — MULTIPLE MYELOMA PANEL, SERUM
Albumin SerPl Elph-Mcnc: 3.9 g/dL (ref 2.9–4.4)
Albumin/Glob SerPl: 1.2 (ref 0.7–1.7)
Alpha 1: 0.2 g/dL (ref 0.0–0.4)
Alpha2 Glob SerPl Elph-Mcnc: 0.7 g/dL (ref 0.4–1.0)
B-Globulin SerPl Elph-Mcnc: 0.9 g/dL (ref 0.7–1.3)
Gamma Glob SerPl Elph-Mcnc: 1.8 g/dL (ref 0.4–1.8)
Globulin, Total: 3.5 g/dL (ref 2.2–3.9)
IgA: 16 mg/dL — ABNORMAL LOW (ref 61–437)
IgG (Immunoglobin G), Serum: 1989 mg/dL — ABNORMAL HIGH (ref 603–1613)
IgM (Immunoglobulin M), Srm: 15 mg/dL — ABNORMAL LOW (ref 20–172)
M Protein SerPl Elph-Mcnc: 1.5 g/dL — ABNORMAL HIGH
Total Protein ELP: 7.4 g/dL (ref 6.0–8.5)

## 2023-08-01 MED ORDER — OXYCODONE-ACETAMINOPHEN 10-325 MG PO TABS
1.0000 | ORAL_TABLET | Freq: Two times a day (BID) | ORAL | 0 refills | Status: DC | PRN
  Filled 2023-08-04: qty 60, 30d supply, fill #0

## 2023-08-04 ENCOUNTER — Other Ambulatory Visit (HOSPITAL_COMMUNITY): Payer: Self-pay

## 2023-08-05 ENCOUNTER — Encounter (HOSPITAL_COMMUNITY)
Admission: RE | Admit: 2023-08-05 | Discharge: 2023-08-05 | Disposition: A | Discharge status: Home / Self Care | Payer: 59 | Source: Ambulatory Visit | Attending: Hematology | Admitting: Hematology

## 2023-08-05 DIAGNOSIS — C9001 Multiple myeloma in remission: Secondary | ICD-10-CM | POA: Insufficient documentation

## 2023-08-05 DIAGNOSIS — C9000 Multiple myeloma not having achieved remission: Secondary | ICD-10-CM | POA: Diagnosis not present

## 2023-08-05 LAB — GLUCOSE, CAPILLARY: Glucose-Capillary: 90 mg/dL (ref 70–99)

## 2023-08-05 MED ORDER — FLUDEOXYGLUCOSE F - 18 (FDG) INJECTION
7.4000 | Freq: Once | INTRAVENOUS | Status: AC
  Administered 2023-08-05: 7.39 via INTRAVENOUS

## 2023-08-08 ENCOUNTER — Inpatient Hospital Stay: Payer: 59

## 2023-08-08 ENCOUNTER — Other Ambulatory Visit: Payer: Self-pay

## 2023-08-08 ENCOUNTER — Ambulatory Visit: Payer: 59 | Admitting: Podiatry

## 2023-08-08 ENCOUNTER — Inpatient Hospital Stay (HOSPITAL_BASED_OUTPATIENT_CLINIC_OR_DEPARTMENT_OTHER): Payer: 59 | Admitting: Hematology

## 2023-08-08 VITALS — BP 167/87 | HR 60

## 2023-08-08 DIAGNOSIS — C9001 Multiple myeloma in remission: Secondary | ICD-10-CM

## 2023-08-08 DIAGNOSIS — Z8249 Family history of ischemic heart disease and other diseases of the circulatory system: Secondary | ICD-10-CM | POA: Diagnosis not present

## 2023-08-08 DIAGNOSIS — Z886 Allergy status to analgesic agent status: Secondary | ICD-10-CM | POA: Diagnosis not present

## 2023-08-08 DIAGNOSIS — Z8616 Personal history of COVID-19: Secondary | ICD-10-CM | POA: Diagnosis not present

## 2023-08-08 DIAGNOSIS — Z87891 Personal history of nicotine dependence: Secondary | ICD-10-CM | POA: Diagnosis not present

## 2023-08-08 DIAGNOSIS — Z5112 Encounter for antineoplastic immunotherapy: Secondary | ICD-10-CM | POA: Diagnosis not present

## 2023-08-08 DIAGNOSIS — Z85820 Personal history of malignant melanoma of skin: Secondary | ICD-10-CM | POA: Diagnosis not present

## 2023-08-08 DIAGNOSIS — M549 Dorsalgia, unspecified: Secondary | ICD-10-CM | POA: Diagnosis not present

## 2023-08-08 DIAGNOSIS — Z95828 Presence of other vascular implants and grafts: Secondary | ICD-10-CM

## 2023-08-08 DIAGNOSIS — G8929 Other chronic pain: Secondary | ICD-10-CM | POA: Diagnosis not present

## 2023-08-08 DIAGNOSIS — C61 Malignant neoplasm of prostate: Secondary | ICD-10-CM

## 2023-08-08 DIAGNOSIS — C9000 Multiple myeloma not having achieved remission: Secondary | ICD-10-CM | POA: Diagnosis not present

## 2023-08-08 DIAGNOSIS — Z9049 Acquired absence of other specified parts of digestive tract: Secondary | ICD-10-CM | POA: Diagnosis not present

## 2023-08-08 DIAGNOSIS — Z8546 Personal history of malignant neoplasm of prostate: Secondary | ICD-10-CM

## 2023-08-08 DIAGNOSIS — Z79899 Other long term (current) drug therapy: Secondary | ICD-10-CM | POA: Diagnosis not present

## 2023-08-08 LAB — CMP (CANCER CENTER ONLY)
ALT: 105 U/L — ABNORMAL HIGH (ref 0–44)
AST: 69 U/L — ABNORMAL HIGH (ref 15–41)
Albumin: 4 g/dL (ref 3.5–5.0)
Alkaline Phosphatase: 104 U/L (ref 38–126)
Anion gap: 3 — ABNORMAL LOW (ref 5–15)
BUN: 12 mg/dL (ref 8–23)
CO2: 28 mmol/L (ref 22–32)
Calcium: 9.1 mg/dL (ref 8.9–10.3)
Chloride: 104 mmol/L (ref 98–111)
Creatinine: 0.8 mg/dL (ref 0.61–1.24)
GFR, Estimated: >60 mL/min (ref >60)
Glucose, Bld: 89 mg/dL (ref 70–99)
Potassium: 4.2 mmol/L (ref 3.5–5.1)
Sodium: 135 mmol/L (ref 135–145)
Total Bilirubin: 0.4 mg/dL (ref 0.0–1.2)
Total Protein: 7.7 g/dL (ref 6.5–8.1)

## 2023-08-08 LAB — CBC WITH DIFFERENTIAL (CANCER CENTER ONLY)
Abs Immature Granulocytes: 0 10*3/uL (ref 0.00–0.07)
Basophils Absolute: 0 10*3/uL (ref 0.0–0.1)
Basophils Relative: 0 %
Eosinophils Absolute: 0 10*3/uL (ref 0.0–0.5)
Eosinophils Relative: 1 %
HCT: 32.8 % — ABNORMAL LOW (ref 39.0–52.0)
Hemoglobin: 11.2 g/dL — ABNORMAL LOW (ref 13.0–17.0)
Immature Granulocytes: 0 %
Lymphocytes Relative: 54 %
Lymphs Abs: 2 10*3/uL (ref 0.7–4.0)
MCH: 32 pg (ref 26.0–34.0)
MCHC: 34.1 g/dL (ref 30.0–36.0)
MCV: 93.7 fL (ref 80.0–100.0)
Monocytes Absolute: 0.2 10*3/uL (ref 0.1–1.0)
Monocytes Relative: 7 %
Neutro Abs: 1.4 10*3/uL — ABNORMAL LOW (ref 1.7–7.7)
Neutrophils Relative %: 38 %
Platelet Count: 171 10*3/uL (ref 150–400)
RBC: 3.5 MIL/uL — ABNORMAL LOW (ref 4.22–5.81)
RDW: 13.1 % (ref 11.5–15.5)
WBC Count: 3.7 10*3/uL — ABNORMAL LOW (ref 4.0–10.5)
nRBC: 0 % (ref 0.0–0.2)

## 2023-08-08 MED ORDER — SODIUM CHLORIDE 0.9% FLUSH
10.0000 mL | INTRAVENOUS | Status: DC | PRN
Start: 2023-08-08 — End: 2023-08-08
  Administered 2023-08-08: 10 mL

## 2023-08-08 MED ORDER — ACETAMINOPHEN 325 MG PO TABS
650.0000 mg | ORAL_TABLET | Freq: Once | ORAL | Status: AC
  Administered 2023-08-08: 650 mg via ORAL
  Filled 2023-08-08: qty 2

## 2023-08-08 MED ORDER — DEXAMETHASONE 4 MG PO TABS
8.0000 mg | ORAL_TABLET | Freq: Once | ORAL | Status: AC
  Administered 2023-08-08: 8 mg via ORAL
  Filled 2023-08-08: qty 2

## 2023-08-08 MED ORDER — HEPARIN SOD (PORK) LOCK FLUSH 100 UNIT/ML IV SOLN
500.0000 [IU] | Freq: Once | INTRAVENOUS | Status: AC | PRN
  Administered 2023-08-08: 500 [IU]

## 2023-08-08 MED ORDER — DARATUMUMAB-HYALURONIDASE-FIHJ 1800-30000 MG-UT/15ML ~~LOC~~ SOLN
1800.0000 mg | Freq: Once | SUBCUTANEOUS | Status: AC
Start: 2023-08-08 — End: 2023-08-08
  Administered 2023-08-08: 1800 mg via SUBCUTANEOUS
  Filled 2023-08-08: qty 15

## 2023-08-08 MED ORDER — DIPHENHYDRAMINE HCL 25 MG PO CAPS
50.0000 mg | ORAL_CAPSULE | Freq: Once | ORAL | Status: AC
  Administered 2023-08-08: 50 mg via ORAL
  Filled 2023-08-08: qty 2

## 2023-08-08 NOTE — Patient Instructions (Signed)
[facility]  Discharge Instructions: Thank you for choosing [facility] to provide your oncology and hematology care.   If you have a lab appointment with the Cancer Center, please go directly to the Cancer Center and check in at the registration area.   Wear comfortable clothing and clothing appropriate for easy access to any Portacath or PICC line.   We strive to give you quality time with your provider. You may need to reschedule your appointment if you arrive late (15 or more minutes).  Arriving late affects you and other patients whose appointments are after yours.  Also, if you miss three or more appointments without notifying the office, you may be dismissed from the clinic at the provider's discretion.      For prescription refill requests, have your pharmacy contact our office and allow 72 hours for refills to be completed.    Today you received the following chemotherapy and/or immunotherapy agents: Darzalex Faspro.       To help prevent nausea and vomiting after your treatment, we encourage you to take your nausea medication as directed.  BELOW ARE SYMPTOMS THAT SHOULD BE REPORTED IMMEDIATELY: *FEVER GREATER THAN 100.4 F (38 C) OR HIGHER *CHILLS OR SWEATING *NAUSEA AND VOMITING THAT IS NOT CONTROLLED WITH YOUR NAUSEA MEDICATION *UNUSUAL SHORTNESS OF BREATH *UNUSUAL BRUISING OR BLEEDING *URINARY PROBLEMS (pain or burning when urinating, or frequent urination) *BOWEL PROBLEMS (unusual diarrhea, constipation, pain near the anus) TENDERNESS IN MOUTH AND THROAT WITH OR WITHOUT PRESENCE OF ULCERS (sore throat, sores in mouth, or a toothache) UNUSUAL RASH, SWELLING OR PAIN  UNUSUAL VAGINAL DISCHARGE OR ITCHING   Items with * indicate a potential emergency and should be followed up as soon as possible or go to the Emergency Department if any problems should occur.  Please show the CHEMOTHERAPY ALERT CARD or  IMMUNOTHERAPY ALERT CARD at check-in to the Emergency Department and triage nurse.  Should you have questions after your visit or need to cancel or reschedule your appointment, please contact [facility]  Dept: [phone]  and follow the prompts.  Office hours are 8:00 a.m. to 4:30 p.m. Monday - Friday. Please note that voicemails left after 4:00 p.m. may not be returned until the following business day.  We are closed weekends and major holidays. You have access to a nurse at all times for urgent questions. Please call the main number to the clinic Dept: [phone] and follow the prompts.   For any non-urgent questions, you may also contact your provider using MyChart. We now offer e-Visits for anyone 58 and older to request care online for non-urgent symptoms. For details visit [email or web address].   Also download the MyChart app! Go to the app store, search "MyChart", open the app, select , and log in with your MyChart username and password.

## 2023-08-10 ENCOUNTER — Other Ambulatory Visit: Payer: Self-pay

## 2023-08-12 LAB — MULTIPLE MYELOMA PANEL, SERUM
Albumin SerPl Elph-Mcnc: 3.7 g/dL (ref 2.9–4.4)
Albumin/Glob SerPl: 1.1 (ref 0.7–1.7)
Alpha 1: 0.3 g/dL (ref 0.0–0.4)
Alpha2 Glob SerPl Elph-Mcnc: 0.7 g/dL (ref 0.4–1.0)
B-Globulin SerPl Elph-Mcnc: 0.9 g/dL (ref 0.7–1.3)
Gamma Glob SerPl Elph-Mcnc: 1.7 g/dL (ref 0.4–1.8)
Globulin, Total: 3.6 g/dL (ref 2.2–3.9)
IgA: 15 mg/dL — ABNORMAL LOW (ref 61–437)
IgG (Immunoglobin G), Serum: 1960 mg/dL — ABNORMAL HIGH (ref 603–1613)
IgM (Immunoglobulin M), Srm: 12 mg/dL — ABNORMAL LOW (ref 20–172)
M Protein SerPl Elph-Mcnc: 1.4 g/dL — ABNORMAL HIGH
Total Protein ELP: 7.3 g/dL (ref 6.0–8.5)

## 2023-08-19 DIAGNOSIS — I10 Essential (primary) hypertension: Secondary | ICD-10-CM | POA: Diagnosis not present

## 2023-08-19 DIAGNOSIS — Z0000 Encounter for general adult medical examination without abnormal findings: Secondary | ICD-10-CM | POA: Diagnosis not present

## 2023-08-19 DIAGNOSIS — I714 Abdominal aortic aneurysm, without rupture, unspecified: Secondary | ICD-10-CM | POA: Diagnosis not present

## 2023-08-19 DIAGNOSIS — M5416 Radiculopathy, lumbar region: Secondary | ICD-10-CM | POA: Diagnosis not present

## 2023-08-19 DIAGNOSIS — Z1211 Encounter for screening for malignant neoplasm of colon: Secondary | ICD-10-CM | POA: Diagnosis not present

## 2023-08-19 DIAGNOSIS — E7800 Pure hypercholesterolemia, unspecified: Secondary | ICD-10-CM | POA: Diagnosis not present

## 2023-08-19 DIAGNOSIS — C9000 Multiple myeloma not having achieved remission: Secondary | ICD-10-CM | POA: Diagnosis not present

## 2023-08-20 ENCOUNTER — Emergency Department (HOSPITAL_COMMUNITY)
Admission: EM | Admit: 2023-08-20 | Discharge: 2023-08-20 | Disposition: A | Discharge status: Home / Self Care | Payer: 59 | Attending: Emergency Medicine | Admitting: Emergency Medicine

## 2023-08-20 ENCOUNTER — Inpatient Hospital Stay: Payer: 59

## 2023-08-20 ENCOUNTER — Inpatient Hospital Stay: Payer: 59 | Admitting: Hematology

## 2023-08-20 ENCOUNTER — Other Ambulatory Visit (HOSPITAL_COMMUNITY): Payer: Self-pay

## 2023-08-20 ENCOUNTER — Encounter (HOSPITAL_COMMUNITY): Payer: Self-pay

## 2023-08-20 ENCOUNTER — Other Ambulatory Visit: Payer: Self-pay

## 2023-08-20 DIAGNOSIS — G8929 Other chronic pain: Secondary | ICD-10-CM | POA: Insufficient documentation

## 2023-08-20 DIAGNOSIS — Z87891 Personal history of nicotine dependence: Secondary | ICD-10-CM | POA: Insufficient documentation

## 2023-08-20 DIAGNOSIS — I10 Essential (primary) hypertension: Secondary | ICD-10-CM | POA: Diagnosis not present

## 2023-08-20 DIAGNOSIS — M545 Low back pain, unspecified: Secondary | ICD-10-CM | POA: Insufficient documentation

## 2023-08-20 DIAGNOSIS — Z79899 Other long term (current) drug therapy: Secondary | ICD-10-CM | POA: Diagnosis not present

## 2023-08-20 DIAGNOSIS — M549 Dorsalgia, unspecified: Secondary | ICD-10-CM | POA: Diagnosis present

## 2023-08-20 DIAGNOSIS — M5459 Other low back pain: Secondary | ICD-10-CM | POA: Diagnosis not present

## 2023-08-20 DIAGNOSIS — Z8579 Personal history of other malignant neoplasms of lymphoid, hematopoietic and related tissues: Secondary | ICD-10-CM | POA: Insufficient documentation

## 2023-08-20 MED ORDER — HYDROMORPHONE HCL 2 MG/ML IJ SOLN
2.0000 mg | Freq: Once | INTRAMUSCULAR | Status: AC
  Administered 2023-08-20: 2 mg via INTRAMUSCULAR
  Filled 2023-08-20: qty 1

## 2023-08-20 MED ORDER — LOSARTAN POTASSIUM 100 MG PO TABS
100.0000 mg | ORAL_TABLET | Freq: Every day | ORAL | 1 refills | Status: DC
  Filled 2023-08-20: qty 90, 90d supply, fill #0

## 2023-08-20 MED ORDER — OXYCODONE-ACETAMINOPHEN 10-325 MG PO TABS
1.0000 | ORAL_TABLET | Freq: Two times a day (BID) | ORAL | 0 refills | Status: DC
  Filled 2023-08-20 – 2023-09-01 (×2): qty 60, 30d supply, fill #0

## 2023-08-20 MED ORDER — AMLODIPINE BESYLATE 5 MG PO TABS
5.0000 mg | ORAL_TABLET | Freq: Every day | ORAL | 1 refills | Status: DC
  Filled 2023-08-20 – 2024-02-02 (×2): qty 90, 90d supply, fill #0

## 2023-08-20 NOTE — ED Triage Notes (Signed)
 Patient reports back pain. States that he has DDD and bone cancer. Reports being treated at the oncology center for bone cancer, but reports pain mainly in the back.

## 2023-08-20 NOTE — ED Provider Notes (Addendum)
 [place] EMERGENCY DEPARTMENT AT [facility] Provider Note   CSN: [id number] Arrival date & time: 08/20/23  0818     History  Chief Complaint  Patient presents with   Back Pain    Travis Nguyen is a 73 y.o. male.  Patient with long-term chronic back pain.  Patient's primary care Dr. Jeralyn prescribes him oxycodone  30-day supply.  Just had that filled on the 16th.  Apparently patient is out because he has been needing to take extra pills.  Primary care doctor is trying to get him into pain management but sounds as if he wants oncology to consider taking over his chronic pain.  Patient is followed by oncology for multiple myeloma that is in remission.  He gets infusions.  Recently had a PET scan on January 29 without evidence of any recurrent disease.  Also no evidence of any bony abnormalities in the lumbar spine.  Patient with the back pain does not radiate into the lower legs.  No numbness or weakness to the feet.  Patient walks okay.  Patient seen December 11 with similar complaint.  Was given some pain relief while in the emergency department.  Did not fill any prescriptions.  Past medical history sniffing for hypertension the multiple myeloma first diagnosed in 2011 degenerative disc disease lumbar.  Past surgical history sniffer appendectomy cholecystectomy.  Patient's had prostatectomy as well.  Patient is a former smoker quit in 1978.       Home Medications Prior to Admission medications   Medication Sig Start Date End Date Taking? Authorizing Provider  acyclovir  (ZOVIRAX ) 400 MG tablet Take 1 tablet (400 mg total) by mouth 2 (two) times daily. Patient not taking: Reported on 02/14/2023 07/01/22   [name], Firas N, MD  amLODipine  (NORVASC ) 10 MG tablet Take 1 tablet by mouth once a day Patient not taking: Reported on 08/30/2022 04/24/21     amLODipine  (NORVASC ) 5 MG tablet Take 1 tablet (5 mg total) by mouth daily. Patient not taking: Reported on 08/30/2022  02/25/22     amLODipine  (NORVASC ) 5 MG tablet Take 1 tablet (5 mg total) by mouth daily. 08/12/22     amLODipine  (NORVASC ) 5 MG tablet Take 1 tablet (5 mg total) by mouth daily. 08/19/23     benzonatate  (TESSALON ) 100 MG capsule Take 1 capsule (100 mg total) by mouth every 8 (eight) hours. 05/25/23   Veta Palma, PA-C  bisacodyl  5 MG EC tablet Take by mouth as directed. Patient not taking: Reported on 07/25/2023 10/17/22   Rosalie Kitchens, MD  Cyanocobalamin  (VITAMIN B12 PO) Take by mouth daily.    [provider]  cyclobenzaprine  (FLEXERIL ) 5 MG tablet Take 1 - 2 tablets by mouth at bedtime as needed Patient not taking: Reported on 07/25/2023 05/20/22     diazepam  (VALIUM ) 5 MG tablet Take 1 tablet (5 mg total) by mouth every 12 (twelve) hours as needed for muscle spasms. 03/31/23   Pamella Ozell LABOR, DO  escitalopram  (LEXAPRO ) 10 MG tablet Take 1 tablet by mouth once a day Patient not taking: Reported on 08/30/2022 04/24/21     escitalopram  (LEXAPRO ) 10 MG tablet Take 1 tablet by mouth once daily Patient not taking: Reported on 07/25/2023 07/24/21     escitalopram  (LEXAPRO ) 10 MG tablet Take 1 tablet (10 mg total) by mouth daily. Patient not taking: Reported on 07/25/2023 08/12/22     gabapentin  (NEURONTIN ) 100 MG capsule Take 1 capsule (100 mg total) by mouth at bedtime for numbness/tingling. 05/28/23  HYDROmorphone  (DILAUDID ) 4 MG tablet Take 1 tablet (4 mg) by mouth every 6 hours as needed for severe pain. Patient not taking: Reported on 07/25/2023 01/06/23   Zammit, Joseph, MD  lidocaine -prilocaine  (EMLA ) cream Apply 1 Application topically once. Prior to port access Patient not taking: Reported on 07/25/2023    [provider]  lidocaine -prilocaine  (EMLA ) cream APPLY TOPICALLY TO PORT-A-CATH DAILY AS NEEDED 06/26/23 06/25/24  Onesimo Emaline Brink, MD  losartan  (COZAAR ) 100 MG tablet TAKE 1 TABLET BY MOUTH ONCE A DAY 06/06/20 06/06/21  [name], MD  losartan  (COZAAR ) 100 MG  tablet Take 1 tablet by mouth once a day Patient not taking: Reported on 08/30/2022 04/24/21     losartan  (COZAAR ) 100 MG tablet Take 1 tablet by mouth daily Patient not taking: Reported on 08/30/2022 07/24/21     losartan  (COZAAR ) 100 MG tablet Take 1 tablet (100 mg total) by mouth daily. Patient not taking: Reported on 02/14/2023 02/25/22     losartan  (COZAAR ) 100 MG tablet Take 1 tablet (100 mg total) by mouth daily. Patient not taking: Reported on 07/25/2023 08/12/22     losartan  (COZAAR ) 100 MG tablet Take 1 tablet (100 mg total) by mouth daily. 08/19/23     methocarbamol  (ROBAXIN ) 500 MG tablet Take 1 tablet (500 mg) by mouth every 8 hours as needed for muscle spasms. Patient not taking: Reported on 07/25/2023 03/01/23   Long, Fonda MATSU, MD  methocarbamol  (ROBAXIN ) 500 MG tablet Take 1 tablet (500 mg total) by mouth 2 (two) times daily. 04/01/23   Lorelle Aleck BROCKS, PA-C  methylPREDNISolone  (MEDROL  DOSEPAK) 4 MG TBPK tablet Take as directed per package instructions for 6 days for inflammation. 05/28/23     naproxen  (NAPROSYN ) 500 MG tablet Take 1 tablet (500 mg total) by mouth 2 (two) times daily. 06/25/23   Suellen Cantor A, PA-C  oxyCODONE -acetaminophen  (PERCOCET ) 10-325 MG tablet Take 1 tablet by mouth 2 times a day Patient not taking: Reported on 08/30/2022 12/15/20     oxyCODONE -acetaminophen  (PERCOCET ) 10-325 MG tablet Take 1 tablet by mouth twice daily as needed (07/05/21) Patient not taking: Reported on 02/14/2023 07/02/21     oxyCODONE -acetaminophen  (PERCOCET ) 10-325 MG tablet Take 1 tablet by mouth 2 (two) times daily as needed. Patient not taking: Reported on 07/25/2023 08/03/21     oxyCODONE -acetaminophen  (PERCOCET ) 10-325 MG tablet Take 1 tablet by mouth 2 times a day as needed orally (ok to fill 30 days after last refill) 02/25/22     oxyCODONE -acetaminophen  (PERCOCET ) 10-325 MG tablet Take 1 tablet by mouth 2 (two) times daily as needed. Patient not taking: Reported on 07/25/2023 04/29/22      oxyCODONE -acetaminophen  (PERCOCET ) 10-325 MG tablet Take 1 tablet by mouth 2 (two) times daily. okay to fill 30 days after last refill Patient not taking: Reported on 07/25/2023 05/22/22     oxyCODONE -acetaminophen  (PERCOCET ) 10-325 MG tablet Take 1 tablet by mouth 2 (two) times daily as needed. (ok to fill 30 days after last refill) Patient not taking: Reported on 08/30/2022 06/27/22     oxyCODONE -acetaminophen  (PERCOCET ) 10-325 MG tablet Take 1 tablet by mouth 2 (two) times daily as needed Patient not taking: Reported on 10/09/2022 09/23/22     oxyCODONE -acetaminophen  (PERCOCET ) 10-325 MG tablet Take 1 tablet by mouth 2 (two) times daily as needed. Patient not taking: Reported on 02/14/2023 11/19/22     oxyCODONE -acetaminophen  (PERCOCET ) 10-325 MG tablet Take 1 tablet by mouth 2 times daily. Patient not taking: Reported on 02/14/2023 01/14/23  oxyCODONE -acetaminophen  (PERCOCET ) 10-325 MG tablet Take 1 tablet by mouth 2 (two) times daily as needed. Patient not taking: Reported on 03/11/2023 02/10/23     oxyCODONE -acetaminophen  (PERCOCET ) 10-325 MG tablet Take 1 tablet by mouth 2 (two) times daily as needed. 03/04/23     oxyCODONE -acetaminophen  (PERCOCET ) 10-325 MG tablet Take 1 tablet by mouth 2 (two) times daily. 03/13/23     oxyCODONE -acetaminophen  (PERCOCET ) 10-325 MG tablet Take 1 tablet by mouth 2 (two) times daily as needed. 04/08/23     oxyCODONE -acetaminophen  (PERCOCET ) 10-325 MG tablet Take 1 tablet by mouth 2 (two) times daily. 05/06/23     oxyCODONE -acetaminophen  (PERCOCET ) 10-325 MG tablet Take 1 tablet by mouth 2 (two) times daily. 05/29/23     oxyCODONE -acetaminophen  (PERCOCET ) 10-325 MG tablet Take 1 tablet by mouth 2 (two) times daily as needed. 06/24/23     oxyCODONE -acetaminophen  (PERCOCET ) 10-325 MG tablet Take 1 tablet by mouth 2 (two) times daily as needed. 07/31/23     oxyCODONE -acetaminophen  (PERCOCET ) 10-325 MG tablet Take 1 tablet by mouth 2 (two) times daily. 08/19/23     pantoprazole   (PROTONIX ) 20 MG tablet Take 1 tablet (20 mg total) by mouth daily. Patient not taking: Reported on 08/30/2022 01/07/21   Griselda Norris, MD  polyethylene glycol-electrolytes (NULYTELY ) 420 g solution Use as directed. Patient not taking: Reported on 07/25/2023 10/17/22   Rosalie Kitchens, MD  senna-docusate (SENOKOT-S) 8.6-50 MG tablet Take 1 tablet by mouth at bedtime as needed for mild constipation. Patient not taking: Reported on 07/25/2023 01/20/23   Long, Joshua G, MD  trimethoprim -polymyxin b  (POLYTRIM ) ophthalmic solution Place 1 drop into both eyes every 4 (four) hours. Patient not taking: Reported on 07/25/2023 09/28/22   Jerrol Agent, MD  vitamin C (ASCORBIC ACID) 500 MG tablet Take 500 mg by mouth daily.    [provider]      Allergies    Aspirin    Review of Systems   Review of Systems  Constitutional:  Negative for chills and fever.  HENT:  Negative for ear pain and sore throat.   Eyes:  Negative for pain and visual disturbance.  Respiratory:  Negative for cough and shortness of breath.   Cardiovascular:  Negative for chest pain and palpitations.  Gastrointestinal:  Negative for abdominal pain and vomiting.  Genitourinary:  Negative for dysuria and hematuria.  Musculoskeletal:  Positive for back pain. Negative for arthralgias.  Skin:  Negative for color change and rash.  Neurological:  Negative for seizures, syncope, weakness and numbness.  All other systems reviewed and are negative.   Physical Exam Updated Vital Signs BP (!) 188/79 (BP Location: Left Arm)   Pulse (!) 59   Temp 98.2 F (36.8 C) (Oral)   Resp 16   Ht 1.854 m (6' 1)   Wt 68.9 kg   SpO2 100%   BMI 20.04 kg/m  Physical Exam Vitals and nursing note reviewed.  Constitutional:      General: He is not in acute distress.    Appearance: Normal appearance. He is well-developed.  HENT:     Head: Normocephalic and atraumatic.     Mouth/Throat:     Mouth: Mucous membranes are moist.  Eyes:      Extraocular Movements: Extraocular movements intact.     Conjunctiva/sclera: Conjunctivae normal.     Pupils: Pupils are equal, round, and reactive to light.  Cardiovascular:     Rate and Rhythm: Normal rate and regular rhythm.     Heart sounds: No murmur heard.  Pulmonary:     Effort: Pulmonary effort is normal. No respiratory distress.     Breath sounds: Normal breath sounds.  Abdominal:     Palpations: Abdomen is soft.     Tenderness: There is no abdominal tenderness.  Musculoskeletal:        General: No swelling.     Cervical back: Normal range of motion and neck supple.  Skin:    General: Skin is warm and dry.     Capillary Refill: Capillary refill takes less than 2 seconds.  Neurological:     General: No focal deficit present.     Mental Status: He is alert and oriented to person, place, and time.  Psychiatric:        Mood and Affect: Mood normal.     ED Results / Procedures / Treatments   Labs (all labs ordered are listed, but only abnormal results are displayed) Labs Reviewed - No data to display  EKG None  Radiology No results found.  Procedures Procedures    Medications Ordered in ED Medications  HYDROmorphone  (DILAUDID ) injection 2 mg (has no administration in time range)    ED Course/ Medical Decision Making/ A&P                                 Medical Decision Making Risk Prescription drug management.   Patient on chronic pain medicines for low back pain.  Receives monthly supply of oxycodone .  But has had to increase his pain medication recently so he is currently out.  Followed by oncology for multiple myeloma.  Recent PET scan showed no bony abnormalities.  Will give IM shot of hydromorphone  here today.  Patient certainly would benefit for referral to pain management.  Patient does have follow-up with oncology next week.   Final Clinical Impression(s) / ED Diagnoses Final diagnoses:  Chronic bilateral low back pain without sciatica     Rx / DC Orders ED Discharge Orders     None         Geraldene Hamilton, MD 08/20/23 1003    Geraldene Hamilton, MD 08/20/23 1006

## 2023-08-20 NOTE — Discharge Instructions (Signed)
 Follow-up with oncology and your primary care doctor as scheduled.  Either primary care doctor or oncology could consider referral to pain management.
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
## 2023-08-22 ENCOUNTER — Inpatient Hospital Stay: Payer: 59

## 2023-08-22 ENCOUNTER — Inpatient Hospital Stay (HOSPITAL_BASED_OUTPATIENT_CLINIC_OR_DEPARTMENT_OTHER): Payer: 59 | Admitting: Hematology

## 2023-08-22 ENCOUNTER — Inpatient Hospital Stay: Payer: 59 | Attending: Oncology

## 2023-08-22 VITALS — BP 175/82 | HR 61 | Temp 97.7°F | Resp 17 | Wt 145.8 lb

## 2023-08-22 DIAGNOSIS — Z79899 Other long term (current) drug therapy: Secondary | ICD-10-CM | POA: Insufficient documentation

## 2023-08-22 DIAGNOSIS — G8929 Other chronic pain: Secondary | ICD-10-CM

## 2023-08-22 DIAGNOSIS — Z79620 Long term (current) use of immunosuppressive biologic: Secondary | ICD-10-CM | POA: Diagnosis not present

## 2023-08-22 DIAGNOSIS — Z8249 Family history of ischemic heart disease and other diseases of the circulatory system: Secondary | ICD-10-CM | POA: Diagnosis not present

## 2023-08-22 DIAGNOSIS — Z5112 Encounter for antineoplastic immunotherapy: Secondary | ICD-10-CM | POA: Diagnosis not present

## 2023-08-22 DIAGNOSIS — Z8616 Personal history of COVID-19: Secondary | ICD-10-CM | POA: Insufficient documentation

## 2023-08-22 DIAGNOSIS — Z7969 Long term (current) use of other immunomodulators and immunosuppressants: Secondary | ICD-10-CM | POA: Diagnosis not present

## 2023-08-22 DIAGNOSIS — Z87891 Personal history of nicotine dependence: Secondary | ICD-10-CM | POA: Diagnosis not present

## 2023-08-22 DIAGNOSIS — Z8042 Family history of malignant neoplasm of prostate: Secondary | ICD-10-CM | POA: Insufficient documentation

## 2023-08-22 DIAGNOSIS — C9001 Multiple myeloma in remission: Secondary | ICD-10-CM

## 2023-08-22 DIAGNOSIS — Z8546 Personal history of malignant neoplasm of prostate: Secondary | ICD-10-CM | POA: Diagnosis not present

## 2023-08-22 DIAGNOSIS — Z85820 Personal history of malignant melanoma of skin: Secondary | ICD-10-CM | POA: Diagnosis not present

## 2023-08-22 DIAGNOSIS — Z886 Allergy status to analgesic agent status: Secondary | ICD-10-CM | POA: Diagnosis not present

## 2023-08-22 DIAGNOSIS — Z9079 Acquired absence of other genital organ(s): Secondary | ICD-10-CM | POA: Insufficient documentation

## 2023-08-22 DIAGNOSIS — M549 Dorsalgia, unspecified: Secondary | ICD-10-CM | POA: Insufficient documentation

## 2023-08-22 DIAGNOSIS — N200 Calculus of kidney: Secondary | ICD-10-CM | POA: Insufficient documentation

## 2023-08-22 DIAGNOSIS — Z9049 Acquired absence of other specified parts of digestive tract: Secondary | ICD-10-CM | POA: Insufficient documentation

## 2023-08-22 DIAGNOSIS — C9000 Multiple myeloma not having achieved remission: Secondary | ICD-10-CM | POA: Insufficient documentation

## 2023-08-22 DIAGNOSIS — Z95828 Presence of other vascular implants and grafts: Secondary | ICD-10-CM

## 2023-08-22 LAB — CBC WITH DIFFERENTIAL (CANCER CENTER ONLY)
Abs Immature Granulocytes: 0 10*3/uL (ref 0.00–0.07)
Basophils Absolute: 0 10*3/uL (ref 0.0–0.1)
Basophils Relative: 1 %
Eosinophils Absolute: 0 10*3/uL (ref 0.0–0.5)
Eosinophils Relative: 0 %
HCT: 33.9 % — ABNORMAL LOW (ref 39.0–52.0)
Hemoglobin: 11.5 g/dL — ABNORMAL LOW (ref 13.0–17.0)
Immature Granulocytes: 0 %
Lymphocytes Relative: 50 %
Lymphs Abs: 2.1 10*3/uL (ref 0.7–4.0)
MCH: 32.2 pg (ref 26.0–34.0)
MCHC: 33.9 g/dL (ref 30.0–36.0)
MCV: 95 fL (ref 80.0–100.0)
Monocytes Absolute: 0.3 10*3/uL (ref 0.1–1.0)
Monocytes Relative: 6 %
Neutro Abs: 1.8 10*3/uL (ref 1.7–7.7)
Neutrophils Relative %: 43 %
Platelet Count: 202 10*3/uL (ref 150–400)
RBC: 3.57 MIL/uL — ABNORMAL LOW (ref 4.22–5.81)
RDW: 12.6 % (ref 11.5–15.5)
WBC Count: 4.2 10*3/uL (ref 4.0–10.5)
nRBC: 0 % (ref 0.0–0.2)

## 2023-08-22 LAB — CMP (CANCER CENTER ONLY)
ALT: 13 U/L (ref 0–44)
AST: 18 U/L (ref 15–41)
Albumin: 4.2 g/dL (ref 3.5–5.0)
Alkaline Phosphatase: 83 U/L (ref 38–126)
Anion gap: 3 — ABNORMAL LOW (ref 5–15)
BUN: 12 mg/dL (ref 8–23)
CO2: 29 mmol/L (ref 22–32)
Calcium: 9.2 mg/dL (ref 8.9–10.3)
Chloride: 103 mmol/L (ref 98–111)
Creatinine: 0.8 mg/dL (ref 0.61–1.24)
GFR, Estimated: >60 mL/min (ref >60)
Glucose, Bld: 92 mg/dL (ref 70–99)
Potassium: 4.1 mmol/L (ref 3.5–5.1)
Sodium: 135 mmol/L (ref 135–145)
Total Bilirubin: 0.4 mg/dL (ref 0.0–1.2)
Total Protein: 8.1 g/dL (ref 6.5–8.1)

## 2023-08-22 MED ORDER — DEXAMETHASONE 4 MG PO TABS
8.0000 mg | ORAL_TABLET | Freq: Once | ORAL | Status: AC
  Administered 2023-08-22: 8 mg via ORAL
  Filled 2023-08-22: qty 2

## 2023-08-22 MED ORDER — DIPHENHYDRAMINE HCL 25 MG PO CAPS
50.0000 mg | ORAL_CAPSULE | Freq: Once | ORAL | Status: AC
Start: 2023-08-22 — End: 2023-08-22
  Administered 2023-08-22: 50 mg via ORAL
  Filled 2023-08-22: qty 2

## 2023-08-22 MED ORDER — SODIUM CHLORIDE 0.9% FLUSH
10.0000 mL | INTRAVENOUS | Status: DC | PRN
  Administered 2023-08-22: 10 mL

## 2023-08-22 MED ORDER — ACETAMINOPHEN 325 MG PO TABS
650.0000 mg | ORAL_TABLET | Freq: Once | ORAL | Status: AC
  Administered 2023-08-22: 650 mg via ORAL
  Filled 2023-08-22: qty 2

## 2023-08-22 MED ORDER — HEPARIN SOD (PORK) LOCK FLUSH 100 UNIT/ML IV SOLN
500.0000 [IU] | Freq: Once | INTRAVENOUS | Status: AC | PRN
  Administered 2023-08-22: 500 [IU]

## 2023-08-22 MED ORDER — DARATUMUMAB-HYALURONIDASE-FIHJ 1800-30000 MG-UT/15ML ~~LOC~~ SOLN
1800.0000 mg | Freq: Once | SUBCUTANEOUS | Status: AC
  Administered 2023-08-22: 1800 mg via SUBCUTANEOUS
  Filled 2023-08-22: qty 15

## 2023-08-22 NOTE — Patient Instructions (Signed)
 [facility] - A DEPT OF [facility]  Discharge Instructions: Thank you for choosing [facility] to provide your oncology and hematology care.   If you have a lab appointment with the Cancer Center, please go directly to the Cancer Center and check in at the registration area.   Wear comfortable clothing and clothing appropriate for easy access to any Portacath or PICC line.   We strive to give you quality time with your provider. You may need to reschedule your appointment if you arrive late (15 or more minutes).  Arriving late affects you and other patients whose appointments are after yours.  Also, if you miss three or more appointments without notifying the office, you may be dismissed from the clinic at the provider's discretion.      For prescription refill requests, have your pharmacy contact our office and allow 72 hours for refills to be completed.    Today you received the following chemotherapy and/or immunotherapy agents: Darzalex Faspro      To help prevent nausea and vomiting after your treatment, we encourage you to take your nausea medication as directed.  BELOW ARE SYMPTOMS THAT SHOULD BE REPORTED IMMEDIATELY: *FEVER GREATER THAN 100.4 F (38 C) OR HIGHER *CHILLS OR SWEATING *NAUSEA AND VOMITING THAT IS NOT CONTROLLED WITH YOUR NAUSEA MEDICATION *UNUSUAL SHORTNESS OF BREATH *UNUSUAL BRUISING OR BLEEDING *URINARY PROBLEMS (pain or burning when urinating, or frequent urination) *BOWEL PROBLEMS (unusual diarrhea, constipation, pain near the anus) TENDERNESS IN MOUTH AND THROAT WITH OR WITHOUT PRESENCE OF ULCERS (sore throat, sores in mouth, or a toothache) UNUSUAL RASH, SWELLING OR PAIN  UNUSUAL VAGINAL DISCHARGE OR ITCHING   Items with * indicate a potential emergency and should be followed up as soon as possible or go to the Emergency Department if any problems should occur.  Please show the CHEMOTHERAPY ALERT CARD or  IMMUNOTHERAPY ALERT CARD at check-in to the Emergency Department and triage nurse.  Should you have questions after your visit or need to cancel or reschedule your appointment, please contact [facility]  Dept: [phone]  and follow the prompts.  Office hours are 8:00 a.m. to 4:30 p.m. Monday - Friday. Please note that voicemails left after 4:00 p.m. may not be returned until the following business day.  We are closed weekends and major holidays. You have access to a nurse at all times for urgent questions. Please call the main number to the clinic Dept: [phone] and follow the prompts.   For any non-urgent questions, you may also contact your provider using MyChart. We now offer e-Visits for anyone 40 and older to request care online for non-urgent symptoms. For details visit [email or web address].   Also download the MyChart app! Go to the app store, search MyChart, open the app, select [facility], and log in with your MyChart username and password.

## 2023-08-23 ENCOUNTER — Other Ambulatory Visit (HOSPITAL_COMMUNITY): Payer: Self-pay

## 2023-08-26 ENCOUNTER — Other Ambulatory Visit: Payer: Self-pay

## 2023-08-26 LAB — MULTIPLE MYELOMA PANEL, SERUM
Albumin SerPl Elph-Mcnc: 3.8 g/dL (ref 2.9–4.4)
Albumin/Glob SerPl: 1.1 (ref 0.7–1.7)
Alpha 1: 0.2 g/dL (ref 0.0–0.4)
Alpha2 Glob SerPl Elph-Mcnc: 0.7 g/dL (ref 0.4–1.0)
B-Globulin SerPl Elph-Mcnc: 0.9 g/dL (ref 0.7–1.3)
Gamma Glob SerPl Elph-Mcnc: 1.9 g/dL — ABNORMAL HIGH (ref 0.4–1.8)
Globulin, Total: 3.7 g/dL (ref 2.2–3.9)
IgA: 18 mg/dL — ABNORMAL LOW (ref 61–437)
IgG (Immunoglobin G), Serum: 2234 mg/dL — ABNORMAL HIGH (ref 603–1613)
IgM (Immunoglobulin M), Srm: 15 mg/dL — ABNORMAL LOW (ref 20–172)
M Protein SerPl Elph-Mcnc: 1.5 g/dL — ABNORMAL HIGH
Total Protein ELP: 7.5 g/dL (ref 6.0–8.5)

## 2023-08-28 ENCOUNTER — Other Ambulatory Visit: Payer: Self-pay

## 2023-08-28 ENCOUNTER — Encounter: Payer: Self-pay | Admitting: Hematology

## 2023-08-29 ENCOUNTER — Ambulatory Visit: Payer: 59 | Admitting: Podiatry

## 2023-09-01 ENCOUNTER — Other Ambulatory Visit (HOSPITAL_COMMUNITY): Payer: Self-pay

## 2023-09-03 ENCOUNTER — Other Ambulatory Visit: Payer: Self-pay

## 2023-09-04 ENCOUNTER — Other Ambulatory Visit (HOSPITAL_COMMUNITY): Payer: Self-pay

## 2023-09-05 ENCOUNTER — Other Ambulatory Visit: Payer: 59

## 2023-09-05 ENCOUNTER — Other Ambulatory Visit: Payer: Self-pay

## 2023-09-05 ENCOUNTER — Ambulatory Visit: Payer: 59

## 2023-09-05 ENCOUNTER — Inpatient Hospital Stay: Payer: 59

## 2023-09-05 VITALS — BP 175/76 | HR 52 | Temp 98.5°F | Resp 18 | Wt 150.0 lb

## 2023-09-05 DIAGNOSIS — C9000 Multiple myeloma not having achieved remission: Secondary | ICD-10-CM | POA: Diagnosis not present

## 2023-09-05 DIAGNOSIS — Z8616 Personal history of COVID-19: Secondary | ICD-10-CM | POA: Diagnosis not present

## 2023-09-05 DIAGNOSIS — Z85820 Personal history of malignant melanoma of skin: Secondary | ICD-10-CM | POA: Diagnosis not present

## 2023-09-05 DIAGNOSIS — C9001 Multiple myeloma in remission: Secondary | ICD-10-CM

## 2023-09-05 DIAGNOSIS — N200 Calculus of kidney: Secondary | ICD-10-CM | POA: Diagnosis not present

## 2023-09-05 DIAGNOSIS — M549 Dorsalgia, unspecified: Secondary | ICD-10-CM | POA: Diagnosis not present

## 2023-09-05 DIAGNOSIS — Z8249 Family history of ischemic heart disease and other diseases of the circulatory system: Secondary | ICD-10-CM | POA: Diagnosis not present

## 2023-09-05 DIAGNOSIS — Z87891 Personal history of nicotine dependence: Secondary | ICD-10-CM | POA: Diagnosis not present

## 2023-09-05 DIAGNOSIS — Z5112 Encounter for antineoplastic immunotherapy: Secondary | ICD-10-CM | POA: Diagnosis not present

## 2023-09-05 DIAGNOSIS — Z886 Allergy status to analgesic agent status: Secondary | ICD-10-CM | POA: Diagnosis not present

## 2023-09-05 DIAGNOSIS — Z7969 Long term (current) use of other immunomodulators and immunosuppressants: Secondary | ICD-10-CM | POA: Diagnosis not present

## 2023-09-05 DIAGNOSIS — Z79899 Other long term (current) drug therapy: Secondary | ICD-10-CM | POA: Diagnosis not present

## 2023-09-05 DIAGNOSIS — Z9049 Acquired absence of other specified parts of digestive tract: Secondary | ICD-10-CM | POA: Diagnosis not present

## 2023-09-05 DIAGNOSIS — Z95828 Presence of other vascular implants and grafts: Secondary | ICD-10-CM

## 2023-09-05 DIAGNOSIS — Z79620 Long term (current) use of immunosuppressive biologic: Secondary | ICD-10-CM | POA: Diagnosis not present

## 2023-09-05 LAB — CMP (CANCER CENTER ONLY)
ALT: 10 U/L (ref 0–44)
AST: 16 U/L (ref 15–41)
Albumin: 3.9 g/dL (ref 3.5–5.0)
Alkaline Phosphatase: 72 U/L (ref 38–126)
Anion gap: 3 — ABNORMAL LOW (ref 5–15)
BUN: 16 mg/dL (ref 8–23)
CO2: 28 mmol/L (ref 22–32)
Calcium: 8.6 mg/dL — ABNORMAL LOW (ref 8.9–10.3)
Chloride: 102 mmol/L (ref 98–111)
Creatinine: 0.93 mg/dL (ref 0.61–1.24)
GFR, Estimated: >60 mL/min (ref >60)
Glucose, Bld: 103 mg/dL — ABNORMAL HIGH (ref 70–99)
Potassium: 4.1 mmol/L (ref 3.5–5.1)
Sodium: 133 mmol/L — ABNORMAL LOW (ref 135–145)
Total Bilirubin: 0.3 mg/dL (ref 0.0–1.2)
Total Protein: 7.1 g/dL (ref 6.5–8.1)

## 2023-09-05 LAB — CBC WITH DIFFERENTIAL (CANCER CENTER ONLY)
Abs Immature Granulocytes: 0.05 10*3/uL (ref 0.00–0.07)
Basophils Absolute: 0 10*3/uL (ref 0.0–0.1)
Basophils Relative: 0 %
Eosinophils Absolute: 0 10*3/uL (ref 0.0–0.5)
Eosinophils Relative: 1 %
HCT: 32.7 % — ABNORMAL LOW (ref 39.0–52.0)
Hemoglobin: 11.1 g/dL — ABNORMAL LOW (ref 13.0–17.0)
Immature Granulocytes: 1 %
Lymphocytes Relative: 48 %
Lymphs Abs: 2.1 10*3/uL (ref 0.7–4.0)
MCH: 32.7 pg (ref 26.0–34.0)
MCHC: 33.9 g/dL (ref 30.0–36.0)
MCV: 96.5 fL (ref 80.0–100.0)
Monocytes Absolute: 0.2 10*3/uL (ref 0.1–1.0)
Monocytes Relative: 5 %
Neutro Abs: 1.9 10*3/uL (ref 1.7–7.7)
Neutrophils Relative %: 45 %
Platelet Count: 139 10*3/uL — ABNORMAL LOW (ref 150–400)
RBC: 3.39 MIL/uL — ABNORMAL LOW (ref 4.22–5.81)
RDW: 13.5 % (ref 11.5–15.5)
WBC Count: 4.2 10*3/uL (ref 4.0–10.5)
nRBC: 0 % (ref 0.0–0.2)

## 2023-09-05 MED ORDER — ACETAMINOPHEN 325 MG PO TABS
650.0000 mg | ORAL_TABLET | Freq: Once | ORAL | Status: AC
  Administered 2023-09-05: 650 mg via ORAL
  Filled 2023-09-05: qty 2

## 2023-09-05 MED ORDER — DARATUMUMAB-HYALURONIDASE-FIHJ 1800-30000 MG-UT/15ML ~~LOC~~ SOLN
1800.0000 mg | Freq: Once | SUBCUTANEOUS | Status: AC
  Administered 2023-09-05: 1800 mg via SUBCUTANEOUS
  Filled 2023-09-05: qty 15

## 2023-09-05 MED ORDER — DEXAMETHASONE 4 MG PO TABS
8.0000 mg | ORAL_TABLET | Freq: Once | ORAL | Status: AC
Start: 2023-09-05 — End: 2023-09-05
  Administered 2023-09-05: 8 mg via ORAL
  Filled 2023-09-05: qty 2

## 2023-09-05 MED ORDER — SODIUM CHLORIDE 0.9% FLUSH
10.0000 mL | INTRAVENOUS | Status: DC | PRN
  Administered 2023-09-05: 10 mL

## 2023-09-05 MED ORDER — HEPARIN SOD (PORK) LOCK FLUSH 100 UNIT/ML IV SOLN
500.0000 [IU] | Freq: Once | INTRAVENOUS | Status: AC | PRN
Start: 2023-09-05 — End: 2023-09-05
  Administered 2023-09-05: 500 [IU]

## 2023-09-05 MED ORDER — DIPHENHYDRAMINE HCL 25 MG PO CAPS
50.0000 mg | ORAL_CAPSULE | Freq: Once | ORAL | Status: AC
  Administered 2023-09-05: 50 mg via ORAL
  Filled 2023-09-05: qty 2

## 2023-09-05 NOTE — Patient Instructions (Signed)
 [facility] - A DEPT OF [facility]  Discharge Instructions: Thank you for choosing [facility] to provide your oncology and hematology care.   If you have a lab appointment with the Cancer Center, please go directly to the Cancer Center and check in at the registration area.   Wear comfortable clothing and clothing appropriate for easy access to any Portacath or PICC line.   We strive to give you quality time with your provider. You may need to reschedule your appointment if you arrive late (15 or more minutes).  Arriving late affects you and other patients whose appointments are after yours.  Also, if you miss three or more appointments without notifying the office, you may be dismissed from the clinic at the provider's discretion.      For prescription refill requests, have your pharmacy contact our office and allow 72 hours for refills to be completed.    Today you received the following chemotherapy and/or immunotherapy agents: Dara Faspro.       To help prevent nausea and vomiting after your treatment, we encourage you to take your nausea medication as directed.  BELOW ARE SYMPTOMS THAT SHOULD BE REPORTED IMMEDIATELY: *FEVER GREATER THAN 100.4 F (38 C) OR HIGHER *CHILLS OR SWEATING *NAUSEA AND VOMITING THAT IS NOT CONTROLLED WITH YOUR NAUSEA MEDICATION *UNUSUAL SHORTNESS OF BREATH *UNUSUAL BRUISING OR BLEEDING *URINARY PROBLEMS (pain or burning when urinating, or frequent urination) *BOWEL PROBLEMS (unusual diarrhea, constipation, pain near the anus) TENDERNESS IN MOUTH AND THROAT WITH OR WITHOUT PRESENCE OF ULCERS (sore throat, sores in mouth, or a toothache) UNUSUAL RASH, SWELLING OR PAIN  UNUSUAL VAGINAL DISCHARGE OR ITCHING   Items with * indicate a potential emergency and should be followed up as soon as possible or go to the Emergency Department if any problems should occur.  Please show the CHEMOTHERAPY ALERT CARD or  IMMUNOTHERAPY ALERT CARD at check-in to the Emergency Department and triage nurse.  Should you have questions after your visit or need to cancel or reschedule your appointment, please contact [facility] - A DEPT OF [facility]  Dept: [phone]  and follow the prompts.  Office hours are 8:00 a.m. to 4:30 p.m. Monday - Friday. Please note that voicemails left after 4:00 p.m. may not be returned until the following business day.  We are closed weekends and major holidays. You have access to a nurse at all times for urgent questions. Please call the main number to the clinic Dept: [phone] and follow the prompts.   For any non-urgent questions, you may also contact your provider using MyChart. We now offer e-Visits for anyone 58 and older to request care online for non-urgent symptoms. For details visit [email or web address].   Also download the MyChart app! Go to the app store, search "MyChart", open the app, select [place], and log in with your MyChart username and password.

## 2023-09-09 LAB — MULTIPLE MYELOMA PANEL, SERUM
Albumin SerPl Elph-Mcnc: 3.7 g/dL (ref 2.9–4.4)
Albumin/Glob SerPl: 1.2 (ref 0.7–1.7)
Alpha 1: 0.2 g/dL (ref 0.0–0.4)
Alpha2 Glob SerPl Elph-Mcnc: 0.5 g/dL (ref 0.4–1.0)
B-Globulin SerPl Elph-Mcnc: 0.7 g/dL (ref 0.7–1.3)
Gamma Glob SerPl Elph-Mcnc: 1.7 g/dL (ref 0.4–1.8)
Globulin, Total: 3.2 g/dL (ref 2.2–3.9)
IgA: 15 mg/dL — ABNORMAL LOW (ref 61–437)
IgG (Immunoglobin G), Serum: 2011 mg/dL — ABNORMAL HIGH (ref 603–1613)
IgM (Immunoglobulin M), Srm: 12 mg/dL — ABNORMAL LOW (ref 20–172)
M Protein SerPl Elph-Mcnc: 1.5 g/dL — ABNORMAL HIGH
Total Protein ELP: 6.9 g/dL (ref 6.0–8.5)

## 2023-09-14 ENCOUNTER — Encounter: Payer: Self-pay | Admitting: Hematology

## 2023-09-14 NOTE — Progress Notes (Signed)
 Wrongly scheduledThis encounter was created in error - please disregard.

## 2023-09-17 ENCOUNTER — Inpatient Hospital Stay: Payer: 59

## 2023-09-17 ENCOUNTER — Inpatient Hospital Stay: Payer: 59 | Admitting: Hematology

## 2023-09-19 ENCOUNTER — Inpatient Hospital Stay: Payer: 59 | Attending: Oncology

## 2023-09-19 ENCOUNTER — Ambulatory Visit: Payer: 59

## 2023-09-19 ENCOUNTER — Ambulatory Visit: Payer: 59 | Admitting: Hematology

## 2023-09-19 ENCOUNTER — Other Ambulatory Visit: Payer: Self-pay

## 2023-09-19 ENCOUNTER — Other Ambulatory Visit: Payer: 59

## 2023-09-19 ENCOUNTER — Inpatient Hospital Stay: Payer: 59 | Admitting: Hematology

## 2023-09-19 ENCOUNTER — Inpatient Hospital Stay: Payer: 59

## 2023-09-19 DIAGNOSIS — C9001 Multiple myeloma in remission: Secondary | ICD-10-CM

## 2023-09-19 DIAGNOSIS — Z79620 Long term (current) use of immunosuppressive biologic: Secondary | ICD-10-CM | POA: Diagnosis not present

## 2023-09-19 DIAGNOSIS — Z95828 Presence of other vascular implants and grafts: Secondary | ICD-10-CM

## 2023-09-19 DIAGNOSIS — Z5112 Encounter for antineoplastic immunotherapy: Secondary | ICD-10-CM | POA: Diagnosis not present

## 2023-09-19 DIAGNOSIS — C9000 Multiple myeloma not having achieved remission: Secondary | ICD-10-CM | POA: Insufficient documentation

## 2023-09-19 LAB — CBC WITH DIFFERENTIAL (CANCER CENTER ONLY)
Abs Immature Granulocytes: 0 10*3/uL (ref 0.00–0.07)
Basophils Absolute: 0 10*3/uL (ref 0.0–0.1)
Basophils Relative: 0 %
Eosinophils Absolute: 0 10*3/uL (ref 0.0–0.5)
Eosinophils Relative: 0 %
HCT: 35.5 % — ABNORMAL LOW (ref 39.0–52.0)
Hemoglobin: 11.8 g/dL — ABNORMAL LOW (ref 13.0–17.0)
Immature Granulocytes: 0 %
Lymphocytes Relative: 46 %
Lymphs Abs: 2.1 10*3/uL (ref 0.7–4.0)
MCH: 32.3 pg (ref 26.0–34.0)
MCHC: 33.2 g/dL (ref 30.0–36.0)
MCV: 97.3 fL (ref 80.0–100.0)
Monocytes Absolute: 0.2 10*3/uL (ref 0.1–1.0)
Monocytes Relative: 5 %
Neutro Abs: 2.2 10*3/uL (ref 1.7–7.7)
Neutrophils Relative %: 49 %
Platelet Count: 182 10*3/uL (ref 150–400)
RBC: 3.65 MIL/uL — ABNORMAL LOW (ref 4.22–5.81)
RDW: 13.2 % (ref 11.5–15.5)
WBC Count: 4.5 10*3/uL (ref 4.0–10.5)
nRBC: 0 % (ref 0.0–0.2)

## 2023-09-19 LAB — CMP (CANCER CENTER ONLY)
ALT: 17 U/L (ref 0–44)
AST: 20 U/L (ref 15–41)
Albumin: 4.2 g/dL (ref 3.5–5.0)
Alkaline Phosphatase: 84 U/L (ref 38–126)
Anion gap: 2 — ABNORMAL LOW (ref 5–15)
BUN: 12 mg/dL (ref 8–23)
CO2: 29 mmol/L (ref 22–32)
Calcium: 8.9 mg/dL (ref 8.9–10.3)
Chloride: 102 mmol/L (ref 98–111)
Creatinine: 0.89 mg/dL (ref 0.61–1.24)
GFR, Estimated: >60 mL/min (ref >60)
Glucose, Bld: 107 mg/dL — ABNORMAL HIGH (ref 70–99)
Potassium: 4.2 mmol/L (ref 3.5–5.1)
Sodium: 133 mmol/L — ABNORMAL LOW (ref 135–145)
Total Bilirubin: 0.3 mg/dL (ref 0.0–1.2)
Total Protein: 8 g/dL (ref 6.5–8.1)

## 2023-09-19 MED ORDER — SODIUM CHLORIDE 0.9% FLUSH
10.0000 mL | INTRAVENOUS | Status: DC | PRN
  Administered 2023-09-19: 10 mL

## 2023-09-19 MED ORDER — HEPARIN SOD (PORK) LOCK FLUSH 100 UNIT/ML IV SOLN
500.0000 [IU] | Freq: Once | INTRAVENOUS | Status: AC | PRN
  Administered 2023-09-19: 500 [IU]

## 2023-09-19 MED ORDER — ACETAMINOPHEN 325 MG PO TABS
650.0000 mg | ORAL_TABLET | Freq: Once | ORAL | Status: AC
  Administered 2023-09-19: 650 mg via ORAL
  Filled 2023-09-19: qty 2

## 2023-09-19 MED ORDER — DARATUMUMAB-HYALURONIDASE-FIHJ 1800-30000 MG-UT/15ML ~~LOC~~ SOLN
1800.0000 mg | Freq: Once | SUBCUTANEOUS | Status: AC
  Administered 2023-09-19: 1800 mg via SUBCUTANEOUS
  Filled 2023-09-19: qty 15

## 2023-09-19 MED ORDER — DIPHENHYDRAMINE HCL 25 MG PO CAPS
50.0000 mg | ORAL_CAPSULE | Freq: Once | ORAL | Status: AC
Start: 2023-09-19 — End: 2023-09-19
  Administered 2023-09-19: 50 mg via ORAL
  Filled 2023-09-19: qty 2

## 2023-09-19 MED ORDER — DEXAMETHASONE 4 MG PO TABS
8.0000 mg | ORAL_TABLET | Freq: Once | ORAL | Status: AC
  Administered 2023-09-19: 8 mg via ORAL
  Filled 2023-09-19: qty 2

## 2023-09-19 NOTE — Progress Notes (Signed)
 Travis Nguyen

## 2023-09-22 LAB — MULTIPLE MYELOMA PANEL, SERUM
Albumin SerPl Elph-Mcnc: 4 g/dL (ref 2.9–4.4)
Albumin/Glob SerPl: 1.1 (ref 0.7–1.7)
Alpha 1: 0.2 g/dL (ref 0.0–0.4)
Alpha2 Glob SerPl Elph-Mcnc: 0.7 g/dL (ref 0.4–1.0)
B-Globulin SerPl Elph-Mcnc: 0.9 g/dL (ref 0.7–1.3)
Gamma Glob SerPl Elph-Mcnc: 2 g/dL — ABNORMAL HIGH (ref 0.4–1.8)
Globulin, Total: 3.8 g/dL (ref 2.2–3.9)
IgA: 17 mg/dL — ABNORMAL LOW (ref 61–437)
IgG (Immunoglobin G), Serum: 2498 mg/dL — ABNORMAL HIGH (ref 603–1613)
IgM (Immunoglobulin M), Srm: 17 mg/dL — ABNORMAL LOW (ref 20–172)
M Protein SerPl Elph-Mcnc: 1.7 g/dL — ABNORMAL HIGH
Total Protein ELP: 7.8 g/dL (ref 6.0–8.5)

## 2023-09-24 ENCOUNTER — Other Ambulatory Visit: Payer: Self-pay | Admitting: Radiology

## 2023-09-24 DIAGNOSIS — C9001 Multiple myeloma in remission: Secondary | ICD-10-CM
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
## 2023-09-25 ENCOUNTER — Emergency Department (HOSPITAL_COMMUNITY)
Admission: EM | Admit: 2023-09-25 | Discharge: 2023-09-25 | Disposition: A | Discharge status: Home / Self Care | Attending: Emergency Medicine | Admitting: Emergency Medicine

## 2023-09-25 ENCOUNTER — Ambulatory Visit (HOSPITAL_COMMUNITY)
Admission: RE | Admit: 2023-09-25 | Discharge: 2023-09-25 | Disposition: A | Discharge status: Home / Self Care | Payer: 59 | Source: Ambulatory Visit | Attending: Hematology | Admitting: Hematology

## 2023-09-25 ENCOUNTER — Encounter (HOSPITAL_COMMUNITY): Payer: Self-pay

## 2023-09-25 ENCOUNTER — Other Ambulatory Visit: Payer: Self-pay

## 2023-09-25 DIAGNOSIS — R6889 Other general symptoms and signs: Secondary | ICD-10-CM | POA: Diagnosis not present

## 2023-09-25 DIAGNOSIS — M545 Low back pain, unspecified: Secondary | ICD-10-CM | POA: Insufficient documentation

## 2023-09-25 DIAGNOSIS — I10 Essential (primary) hypertension: Secondary | ICD-10-CM | POA: Insufficient documentation

## 2023-09-25 DIAGNOSIS — D649 Anemia, unspecified: Secondary | ICD-10-CM | POA: Insufficient documentation

## 2023-09-25 DIAGNOSIS — C9001 Multiple myeloma in remission: Secondary | ICD-10-CM

## 2023-09-25 DIAGNOSIS — R58 Hemorrhage, not elsewhere classified: Secondary | ICD-10-CM | POA: Diagnosis not present

## 2023-09-25 DIAGNOSIS — L7622 Postprocedural hemorrhage and hematoma of skin and subcutaneous tissue following other procedure: Secondary | ICD-10-CM | POA: Diagnosis not present

## 2023-09-25 DIAGNOSIS — C9010 Plasma cell leukemia not having achieved remission: Secondary | ICD-10-CM | POA: Diagnosis not present

## 2023-09-25 DIAGNOSIS — I9762 Postprocedural hemorrhage of a circulatory system organ or structure following other procedure: Secondary | ICD-10-CM | POA: Insufficient documentation

## 2023-09-25 DIAGNOSIS — Z8546 Personal history of malignant neoplasm of prostate: Secondary | ICD-10-CM | POA: Insufficient documentation

## 2023-09-25 DIAGNOSIS — D72819 Decreased white blood cell count, unspecified: Secondary | ICD-10-CM | POA: Diagnosis not present

## 2023-09-25 DIAGNOSIS — Z1379 Encounter for other screening for genetic and chromosomal anomalies: Secondary | ICD-10-CM | POA: Insufficient documentation

## 2023-09-25 DIAGNOSIS — C9000 Multiple myeloma not having achieved remission: Secondary | ICD-10-CM | POA: Insufficient documentation

## 2023-09-25 DIAGNOSIS — G8929 Other chronic pain: Secondary | ICD-10-CM | POA: Insufficient documentation

## 2023-09-25 DIAGNOSIS — Z87891 Personal history of nicotine dependence: Secondary | ICD-10-CM | POA: Insufficient documentation

## 2023-09-25 DIAGNOSIS — Z743 Need for continuous supervision: Secondary | ICD-10-CM | POA: Diagnosis not present

## 2023-09-25 LAB — CBC
HCT: 31.2 % — ABNORMAL LOW (ref 39.0–52.0)
Hemoglobin: 10.3 g/dL — ABNORMAL LOW (ref 13.0–17.0)
MCH: 33.1 pg (ref 26.0–34.0)
MCHC: 33 g/dL (ref 30.0–36.0)
MCV: 100.3 fL — ABNORMAL HIGH (ref 80.0–100.0)
Platelets: 173 10*3/uL (ref 150–400)
RBC: 3.11 MIL/uL — ABNORMAL LOW (ref 4.22–5.81)
RDW: 13.4 % (ref 11.5–15.5)
WBC: 4.3 10*3/uL (ref 4.0–10.5)
nRBC: 0 % (ref 0.0–0.2)

## 2023-09-25 LAB — CBC WITH DIFFERENTIAL/PLATELET
Abs Immature Granulocytes: 0 10*3/uL (ref 0.00–0.07)
Basophils Absolute: 0 10*3/uL (ref 0.0–0.1)
Basophils Relative: 0 %
Eosinophils Absolute: 0 10*3/uL (ref 0.0–0.5)
Eosinophils Relative: 0 %
HCT: 34 % — ABNORMAL LOW (ref 39.0–52.0)
Hemoglobin: 10.9 g/dL — ABNORMAL LOW (ref 13.0–17.0)
Immature Granulocytes: 0 %
Lymphocytes Relative: 45 %
Lymphs Abs: 1.7 10*3/uL (ref 0.7–4.0)
MCH: 32.1 pg (ref 26.0–34.0)
MCHC: 32.1 g/dL (ref 30.0–36.0)
MCV: 100 fL (ref 80.0–100.0)
Monocytes Absolute: 0.3 10*3/uL (ref 0.1–1.0)
Monocytes Relative: 7 %
Neutro Abs: 1.8 10*3/uL (ref 1.7–7.7)
Neutrophils Relative %: 48 %
Platelets: 170 10*3/uL (ref 150–400)
RBC: 3.4 MIL/uL — ABNORMAL LOW (ref 4.22–5.81)
RDW: 13.2 % (ref 11.5–15.5)
WBC: 3.7 10*3/uL — ABNORMAL LOW (ref 4.0–10.5)
nRBC: 0 % (ref 0.0–0.2)

## 2023-09-25 LAB — BASIC METABOLIC PANEL
Anion gap: 5 (ref 5–15)
BUN: 15 mg/dL (ref 8–23)
CO2: 22 mmol/L (ref 22–32)
Calcium: 8.2 mg/dL — ABNORMAL LOW (ref 8.9–10.3)
Chloride: 107 mmol/L (ref 98–111)
Creatinine, Ser: 0.79 mg/dL (ref 0.61–1.24)
GFR, Estimated: >60 mL/min (ref >60)
Glucose, Bld: 86 mg/dL (ref 70–99)
Potassium: 4.3 mmol/L (ref 3.5–5.1)
Sodium: 134 mmol/L — ABNORMAL LOW (ref 135–145)

## 2023-09-25 MED ORDER — MIDAZOLAM HCL 2 MG/2ML IJ SOLN
INTRAMUSCULAR | Status: AC
  Filled 2023-09-25: qty 2

## 2023-09-25 MED ORDER — OXYCODONE-ACETAMINOPHEN 5-325 MG PO TABS
2.0000 | ORAL_TABLET | Freq: Once | ORAL | Status: AC
  Administered 2023-09-25: 2 via ORAL
  Filled 2023-09-25: qty 2

## 2023-09-25 MED ORDER — FENTANYL CITRATE (PF) 100 MCG/2ML IJ SOLN
INTRAMUSCULAR | Status: AC | PRN
  Administered 2023-09-25: 50 ug via INTRAVENOUS

## 2023-09-25 MED ORDER — FENTANYL CITRATE (PF) 100 MCG/2ML IJ SOLN
INTRAMUSCULAR | Status: AC
Start: 2023-09-25 — End: ?
  Filled 2023-09-25: qty 2

## 2023-09-25 MED ORDER — SODIUM CHLORIDE 0.9 % IV SOLN: INTRAVENOUS | Status: DC

## 2023-09-25 MED ORDER — MIDAZOLAM HCL 2 MG/2ML IJ SOLN
INTRAMUSCULAR | Status: AC | PRN
  Administered 2023-09-25: 1 mg via INTRAVENOUS

## 2023-09-25 MED ORDER — LIDOCAINE HCL 1 % IJ SOLN
INTRAMUSCULAR | Status: AC | PRN
  Administered 2023-09-25: 10 mL via INTRADERMAL

## 2023-09-25 NOTE — Discharge Instructions (Addendum)
Discharge Instructions:   Please call Interventional Radiology clinic [phone] with any questions or concerns.  You may remove your dressing and shower tomorrow.  Moderate Conscious Sedation, Adult, Care After This sheet gives you information about how to care for yourself after your procedure. Your health care provider may also give you more specific instructions. If you have problems or questions, contact your health care provider. What can I expect after the procedure? After the procedure, it is common to have: Sleepiness for several hours. Impaired judgment for several hours. Difficulty with balance. Vomiting if you eat too soon. Follow these instructions at home: For the time period you were told by your health care provider: Rest. Do not participate in activities where you could fall or become injured. Do not drive or use machinery. Do not drink alcohol. Do not take sleeping pills or medicines that cause drowsiness. Do not make important decisions or sign legal documents. Do not take care of children on your own. Eating and drinking  Follow the diet recommended by your health care provider. Drink enough fluid to keep your urine pale yellow. If you vomit: Drink water, juice, or soup when you can drink without vomiting. Make sure you have little or no nausea before eating solid foods. General instructions Take over-the-counter and prescription medicines only as told by your health care provider. Have a responsible adult stay with you for the time you are told. It is important to have someone help care for you until you are awake and alert. Do not smoke. Keep all follow-up visits as told by your health care provider. This is important. Contact a health care provider if: You are still sleepy or having trouble with balance after 24 hours. You feel light-headed. You keep feeling nauseous or you keep vomiting. You develop a rash. You have a fever. You have redness or  swelling around the IV site. Get help right away if: You have trouble breathing. You have new-onset confusion at home. Summary After the procedure, it is common to feel sleepy, have impaired judgment, or feel nauseous if you eat too soon. Rest after you get home. Know the things you should not do after the procedure. Follow the diet recommended by your health care provider and drink enough fluid to keep your urine pale yellow. Get help right away if you have trouble breathing or new-onset confusion at home. This information is not intended to replace advice given to you by your health care provider. Make sure you discuss any questions you have with your health care provider. Document Revised: 10/29/2019 Document Reviewed: 05/27/2019 Elsevier Patient Education  2023 Elsevier Inc.    Bone Marrow Aspiration and Bone Marrow Biopsy, Adult, Care After This sheet gives you information about how to care for yourself after your procedure. Your health care provider may also give you more specific instructions. If you have problems or questions, contact your health care provider. What can I expect after the procedure? After the procedure, it is common to have: Mild pain and tenderness. Swelling. Bruising. Follow these instructions at home: Puncture site care  Follow instructions from your health care provider about how to take care of the puncture site. Make sure you: Wash your hands with soap and water before and after you change your bandage (dressing). If soap and water are not available, use hand sanitizer. Change your dressing as told by your health care provider. Check your puncture site every day for signs of infection. Check for: More redness, swelling, or pain.   Fluid or blood. Warmth. Pus or a bad smell. Activity Return to your normal activities as told by your health care provider. Ask your health care provider what activities are safe for you. Do not lift anything that is heavier  than 10 lb (4.5 kg), or the limit that you are told, until your health care provider says that it is safe. Do not drive for 24 hours if you were given a sedative during your procedure. General instructions  Take over-the-counter and prescription medicines only as told by your health care provider. Do not take baths, swim, or use a hot tub until your health care provider approves. Ask your health care provider if you may take showers. You may only be allowed to take sponge baths. If directed, put ice on the affected area. To do this: Put ice in a plastic bag. Place a towel between your skin and the bag. Leave the ice on for 20 minutes, 2-3 times a day. Keep all follow-up visits as told by your health care provider. This is important. Contact a health care provider if: Your pain is not controlled with medicine. You have a fever. You have more redness, swelling, or pain around the puncture site. You have fluid or blood coming from the puncture site. Your puncture site feels warm to the touch. You have pus or a bad smell coming from the puncture site. Summary After the procedure, it is common to have mild pain, tenderness, swelling, and bruising. Follow instructions from your health care provider about how to take care of the puncture site and what activities are safe for you. Take over-the-counter and prescription medicines only as told by your health care provider. Contact a health care provider if you have any signs of infection, such as fluid or blood coming from the puncture site. This information is not intended to replace advice given to you by your health care provider. Make sure you discuss any questions you have with your health care provider. Document Revised: 11/17/2018 Document Reviewed: 11/17/2018 Elsevier Patient Education  2023 Elsevier Inc. 

## 2023-09-25 NOTE — ED Notes (Signed)
 Reviewed D/C information with the patient, pt verbalized understanding. No additional concerns at this time.

## 2023-09-25 NOTE — Discharge Instructions (Signed)
 Your blood test showed that your hemoglobin is stable.  Keep the dressing in place today and change it tomorrow.  Follow-up with your doctors as planned to be rechecked

## 2023-09-25 NOTE — Procedures (Signed)
Interventional Radiology Procedure Note  Procedure: CT guided aspirate and core biopsy of right iliac bone  Complications: None  Recommendations: - Bedrest supine x 1 hrs - Hydrocodone PRN  Pain - Follow biopsy results   Arel Tippen, MD   

## 2023-09-25 NOTE — ED Triage Notes (Signed)
"  Had a biopsy done today, I had been constipated and then when I had bowel movement there was blood and it scared me that it was coming from my rectum" per pt

## 2023-09-25 NOTE — ED Provider Notes (Signed)
 [place] EMERGENCY DEPARTMENT AT [facility] Provider Note   CSN: [id number] Arrival date & time: 09/25/23  1627     History  Chief complaint: Postoperative bleeding.  Travis Nguyen is a 73 y.o. male.  HPI   Patient has history of hypertension pneumonia anemia gallstones arthritis chronic back pain, melanoma, prostate cancer, multiple myeloma, degenerative disc disease.  Patient is on chronic opiate pain medications for back pain. He was in the hospital today to have a bone marrow biopsy of his right iliac bone.  Patient states he tolerated the procedure without difficulty.  He was at home today straining to have a bowel movement when he started to notice blood.  Patient felt that the bleeding was coming from this but was not sure if he might of had some in his stool he did notice some blood in the commode.  Patient's asked for a dose of his chronic back pain medications while he is here.  Patient also states he had not taken his blood pressure medications today Home Medications Prior to Admission medications   Medication Sig Start Date End Date Taking? Authorizing Provider  acyclovir (ZOVIRAX) 400 MG tablet Take 1 tablet (400 mg total) by mouth 2 (two) times daily. Patient not taking: Reported on 02/14/2023 07/01/22   Benjiman Core, MD  amLODipine (NORVASC) 10 MG tablet Take 1 tablet by mouth once a day Patient not taking: Reported on 08/30/2022 04/24/21     amLODipine (NORVASC) 5 MG tablet Take 1 tablet (5 mg total) by mouth daily. Patient not taking: Reported on 08/30/2022 02/25/22     amLODipine (NORVASC) 5 MG tablet Take 1 tablet (5 mg total) by mouth daily. 08/12/22     amLODipine (NORVASC) 5 MG tablet Take 1 tablet (5 mg total) by mouth daily. 08/19/23     benzonatate (TESSALON) 100 MG capsule Take 1 capsule (100 mg total) by mouth every 8 (eight) hours. 05/25/23   Arabella Merles, PA-C  bisacodyl 5 MG EC tablet Take by mouth as directed. Patient not taking:  Reported on 07/25/2023 10/17/22   [name], MD  Cyanocobalamin (VITAMIN B12 PO) Take by mouth daily.    [provider]  cyclobenzaprine (FLEXERIL) 5 MG tablet Take 1 - 2 tablets by mouth at bedtime as needed Patient not taking: Reported on 07/25/2023 05/20/22     diazepam (VALIUM) 5 MG tablet Take 1 tablet (5 mg total) by mouth every 12 (twelve) hours as needed for muscle spasms. 03/31/23   [name], DO  escitalopram (LEXAPRO) 10 MG tablet Take 1 tablet by mouth once a day Patient not taking: Reported on 08/30/2022 04/24/21     escitalopram (LEXAPRO) 10 MG tablet Take 1 tablet by mouth once daily Patient not taking: Reported on 07/25/2023 07/24/21     escitalopram (LEXAPRO) 10 MG tablet Take 1 tablet (10 mg total) by mouth daily. Patient not taking: Reported on 07/25/2023 08/12/22     gabapentin (NEURONTIN) 100 MG capsule Take 1 capsule (100 mg total) by mouth at bedtime for numbness/tingling. 05/28/23     HYDROmorphone (DILAUDID) 4 MG tablet Take 1 tablet (4 mg) by mouth every 6 hours as needed for severe pain. Patient not taking: Reported on 07/25/2023 01/06/23   [name], MD  lidocaine-prilocaine (EMLA) cream Apply 1 Application topically once. Prior to port access Patient not taking: Reported on 07/25/2023    [provider]  lidocaine-prilocaine (EMLA) cream APPLY TOPICALLY TO PORT-A-CATH DAILY AS NEEDED 06/26/23 06/25/24  Johney Maine, MD  losartan (COZAAR) 100 MG tablet TAKE 1 TABLET BY MOUTH ONCE A DAY 06/06/20 06/06/21  [name], MD  losartan (COZAAR) 100 MG tablet Take 1 tablet by mouth once a day Patient not taking: Reported on 08/30/2022 04/24/21     losartan (COZAAR) 100 MG tablet Take 1 tablet by mouth daily Patient not taking: Reported on 08/30/2022 07/24/21     losartan (COZAAR) 100 MG tablet Take 1 tablet (100 mg total) by mouth daily. Patient not taking: Reported on 02/14/2023 02/25/22     losartan (COZAAR) 100 MG tablet Take 1 tablet (100 mg  total) by mouth daily. Patient not taking: Reported on 07/25/2023 08/12/22     losartan (COZAAR) 100 MG tablet Take 1 tablet (100 mg total) by mouth daily. 08/19/23     methocarbamol (ROBAXIN) 500 MG tablet Take 1 tablet (500 mg) by mouth every 8 hours as needed for muscle spasms. Patient not taking: Reported on 07/25/2023 03/01/23   Long, [name], MD  methocarbamol (ROBAXIN) 500 MG tablet Take 1 tablet (500 mg total) by mouth 2 (two) times daily. 04/01/23   Mannie Stabile, PA-C  methylPREDNISolone (MEDROL DOSEPAK) 4 MG TBPK tablet Take as directed per package instructions for 6 days for inflammation. 05/28/23     naproxen (NAPROSYN) 500 MG tablet Take 1 tablet (500 mg total) by mouth 2 (two) times daily. 06/25/23   Carmel Sacramento A, PA-C  oxyCODONE-acetaminophen (PERCOCET) 10-325 MG tablet Take 1 tablet by mouth 2 times a day Patient not taking: Reported on 08/30/2022 12/15/20     oxyCODONE-acetaminophen (PERCOCET) 10-325 MG tablet Take 1 tablet by mouth twice daily as needed (07/05/21) Patient not taking: Reported on 02/14/2023 07/02/21     oxyCODONE-acetaminophen (PERCOCET) 10-325 MG tablet Take 1 tablet by mouth 2 (two) times daily as needed. Patient not taking: Reported on 07/25/2023 08/03/21     oxyCODONE-acetaminophen (PERCOCET) 10-325 MG tablet Take 1 tablet by mouth 2 times a day as needed orally (ok to fill 30 days after last refill) 02/25/22     oxyCODONE-acetaminophen (PERCOCET) 10-325 MG tablet Take 1 tablet by mouth 2 (two) times daily as needed. Patient not taking: Reported on 07/25/2023 04/29/22     oxyCODONE-acetaminophen (PERCOCET) 10-325 MG tablet Take 1 tablet by mouth 2 (two) times daily. okay to fill 30 days after last refill Patient not taking: Reported on 07/25/2023 05/22/22     oxyCODONE-acetaminophen (PERCOCET) 10-325 MG tablet Take 1 tablet by mouth 2 (two) times daily as needed. (ok to fill 30 days after last refill) Patient not taking: Reported on 08/30/2022 06/27/22      oxyCODONE-acetaminophen (PERCOCET) 10-325 MG tablet Take 1 tablet by mouth 2 (two) times daily as needed Patient not taking: Reported on 10/09/2022 09/23/22     oxyCODONE-acetaminophen (PERCOCET) 10-325 MG tablet Take 1 tablet by mouth 2 (two) times daily as needed. Patient not taking: Reported on 02/14/2023 11/19/22     oxyCODONE-acetaminophen (PERCOCET) 10-325 MG tablet Take 1 tablet by mouth 2 times daily. Patient not taking: Reported on 02/14/2023 01/14/23     oxyCODONE-acetaminophen (PERCOCET) 10-325 MG tablet Take 1 tablet by mouth 2 (two) times daily as needed. Patient not taking: Reported on 03/11/2023 02/10/23     oxyCODONE-acetaminophen (PERCOCET) 10-325 MG tablet Take 1 tablet by mouth 2 (two) times daily as needed. 03/04/23     oxyCODONE-acetaminophen (PERCOCET) 10-325 MG tablet Take 1 tablet by mouth 2 (two) times daily. 03/13/23     oxyCODONE-acetaminophen (PERCOCET) 10-325  MG tablet Take 1 tablet by mouth 2 (two) times daily as needed. 04/08/23     oxyCODONE-acetaminophen (PERCOCET) 10-325 MG tablet Take 1 tablet by mouth 2 (two) times daily. 05/06/23     oxyCODONE-acetaminophen (PERCOCET) 10-325 MG tablet Take 1 tablet by mouth 2 (two) times daily. 05/29/23     oxyCODONE-acetaminophen (PERCOCET) 10-325 MG tablet Take 1 tablet by mouth 2 (two) times daily as needed. 06/24/23     oxyCODONE-acetaminophen (PERCOCET) 10-325 MG tablet Take 1 tablet by mouth 2 (two) times daily as needed. 07/31/23     oxyCODONE-acetaminophen (PERCOCET) 10-325 MG tablet Take 1 tablet by mouth 2 (two) times daily. 08/19/23     pantoprazole (PROTONIX) 20 MG tablet Take 1 tablet (20 mg total) by mouth daily. Patient not taking: Reported on 08/30/2022 01/07/21   Tilden Fossa, MD  polyethylene glycol-electrolytes (NULYTELY) 420 g solution Use as directed. Patient not taking: Reported on 07/25/2023 10/17/22   [name], MD  senna-docusate (SENOKOT-S) 8.6-50 MG tablet Take 1 tablet by mouth at bedtime as needed for mild  constipation. Patient not taking: Reported on 07/25/2023 01/20/23   Long, [name], MD  trimethoprim-polymyxin b (POLYTRIM) ophthalmic solution Place 1 drop into both eyes every 4 (four) hours. Patient not taking: Reported on 07/25/2023 09/28/22   Ernie Avena, MD  vitamin C (ASCORBIC ACID) 500 MG tablet Take 500 mg by mouth daily.    [provider]      Allergies    Aspirin    Review of Systems   Review of Systems  Physical Exam Updated Vital Signs BP (!) 163/93   Pulse 64   Resp 18   Ht 1.854 m (6\' 1" )   Wt 66.2 kg   SpO2 98%   BMI 19.26 kg/m  Physical Exam Vitals and nursing note reviewed.  Constitutional:      Appearance: He is well-developed. He is not diaphoretic.  HENT:     Head: Normocephalic and atraumatic.     Right Ear: External ear normal.     Left Ear: External ear normal.  Eyes:     General: No scleral icterus.       Right eye: No discharge.        Left eye: No discharge.     Conjunctiva/sclera: Conjunctivae normal.  Neck:     Trachea: No tracheal deviation.  Cardiovascular:     Rate and Rhythm: Normal rate and regular rhythm.  Pulmonary:     Effort: Pulmonary effort is normal. No respiratory distress.     Breath sounds: Normal breath sounds. No stridor. No wheezing or rales.  Abdominal:     General: Bowel sounds are normal. There is no distension.     Palpations: Abdomen is soft.     Tenderness: There is no abdominal tenderness. There is no guarding or rebound.  Genitourinary:    Comments: Rectal exam without any mass, no gross blood noted Musculoskeletal:        General: No tenderness or deformity.     Cervical back: Neck supple.     Comments: Small puncture wound in the lower back posterior iliac crest region, fresh blood noted on a bandage no active bleeding at this time  Skin:    General: Skin is warm and dry.     Findings: No rash.  Neurological:     General: No focal deficit present.     Mental Status: He is alert.     Cranial  Nerves: No cranial nerve deficit, dysarthria or  facial asymmetry.     Sensory: No sensory deficit.     Motor: No abnormal muscle tone or seizure activity.     Coordination: Coordination normal.  Psychiatric:        Mood and Affect: Mood normal.     ED Results / Procedures / Treatments   Labs (all labs ordered are listed, but only abnormal results are displayed) Labs Reviewed  CBC - Abnormal; Notable for the following components:      Result Value   RBC 3.11 (*)    Hemoglobin 10.3 (*)    HCT 31.2 (*)    MCV 100.3 (*)    All other components within normal limits  BASIC METABOLIC PANEL - Abnormal; Notable for the following components:   Sodium 134 (*)    Calcium 8.2 (*)    All other components within normal limits    EKG None  Radiology CT BONE MARROW BIOPSY & ASPIRATION Result Date: 09/25/2023 INDICATION: 73 year old male history of multiple myeloma. EXAM: CT-GUIDED BONE MARROW BIOPSY AND ASPIRATION MEDICATIONS: None ANESTHESIA/SEDATION: Fentanyl 100 mcg IV; Versed 2 mg IV Sedation Time: 10 minutes; The patient was continuously monitored during the procedure by the interventional radiology nurse under my direct supervision. COMPLICATIONS: None immediate. PROCEDURE: Informed consent was obtained from the patient following an explanation of the procedure, risks, benefits and alternatives. The patient understands, agrees and consents for the procedure. All questions were addressed. A time out was performed prior to the initiation of the procedure. The patient was positioned prone and non-contrast localization CT was performed of the pelvis to demonstrate the iliac marrow spaces. The operative site was prepped and draped in the usual sterile fashion. Under sterile conditions and local anesthesia, a 22 gauge spinal needle was utilized for procedural planning. Next, an 11 gauge coaxial bone biopsy needle was advanced into the right iliac marrow space. Needle position was confirmed with CT  imaging. Initially, a bone marrow aspiration was performed. Next, a bone marrow biopsy was obtained with the 11 gauge outer bone marrow device. The needle was removed and superficial hemostasis was obtained with manual compression. A dressing was applied. The patient tolerated the procedure well without immediate post procedural complication. IMPRESSION: Successful CT guided right iliac bone marrow aspiration and core biopsy. Marliss Coots, MD [facility] Radiology Electronically Signed   By: Marliss Coots M.D.   On: 09/25/2023 10:02    Procedures Procedures   Clean dry sterile dressing applied to wound Medications Ordered in ED Medications  oxyCODONE-acetaminophen (PERCOCET/ROXICET) 5-325 MG per tablet 2 tablet (2 tablets Oral Given 09/25/23 1736)    ED Course/ Medical Decision Making/ A&P Clinical Course as of 09/25/23 1857  Thu Sep 25, 2023  1833 CBC(!) hemoglobin decreased from 10.9-10.3.  No signs of severe blood loss.  Metabolic panel unremarkable [JK]    Clinical Course User Index [JK] Linwood Dibbles, MD                                 Medical Decision Making Problems Addressed: Bleeding: acute illness or injury that poses a threat to life or bodily functions  Amount and/or Complexity of Data Reviewed Labs: ordered. Decision-making details documented in ED Course.  Risk Prescription drug management.   Patient presented to the ED for evaluation of bleeding after a bone marrow biopsy today.  Patient states he was going to the bathroom.  He strained and started to  noticing blood from the wound.  Patient however states he was not entirely sure that he was not having blood in his stool.  On exam patient was noted to have a small biopsy wound that did not have any active bleeding however there was a fresh blood clot and bloody dressing over the wound.  Rectal exam was performed and patient did not have any ends of rectal bleeding on exam.   I suspect the blood that he noticed in the commode was related to the wound on his lower back that was bleeding.  Patient was monitored in the ED.  A fresh dressing was applied and there was no recurrent bleeding.  Patient's hemoglobin is stable.  Evaluation and diagnostic testing in the emergency department does not suggest an emergent condition requiring admission or immediate intervention beyond what has been performed at this time.  The patient is safe for discharge and has been instructed to return immediately for worsening symptoms, change in symptoms or any other concerns.        Final Clinical Impression(s) / ED Diagnoses Final diagnoses:  Bleeding    Rx / DC Orders ED Discharge Orders     None         Linwood Dibbles, MD 09/25/23 1859

## 2023-09-29 ENCOUNTER — Other Ambulatory Visit (HOSPITAL_COMMUNITY): Payer: Self-pay

## 2023-09-29 ENCOUNTER — Emergency Department (HOSPITAL_COMMUNITY)
Admission: EM | Admit: 2023-09-29 | Discharge: 2023-09-29 | Disposition: A | Discharge status: Home / Self Care | Attending: Emergency Medicine | Admitting: Emergency Medicine

## 2023-09-29 DIAGNOSIS — M545 Low back pain, unspecified: Secondary | ICD-10-CM

## 2023-09-29 DIAGNOSIS — G8929 Other chronic pain: Secondary | ICD-10-CM | POA: Diagnosis not present

## 2023-09-29 DIAGNOSIS — M5459 Other low back pain: Secondary | ICD-10-CM | POA: Diagnosis not present

## 2023-09-29 DIAGNOSIS — M549 Dorsalgia, unspecified: Secondary | ICD-10-CM | POA: Diagnosis present

## 2023-09-29 LAB — SURGICAL PATHOLOGY

## 2023-09-29 MED ORDER — OXYCODONE-ACETAMINOPHEN 10-325 MG PO TABS
1.0000 | ORAL_TABLET | Freq: Two times a day (BID) | ORAL | 0 refills | Status: DC
  Filled 2023-09-29 – 2023-10-02 (×2): qty 60, 30d supply, fill #0

## 2023-09-29 MED ORDER — HYDROMORPHONE HCL 1 MG/ML IJ SOLN
1.0000 mg | Freq: Once | INTRAMUSCULAR | Status: AC
  Administered 2023-09-29: 1 mg via INTRAMUSCULAR
  Filled 2023-09-29: qty 1

## 2023-09-29 NOTE — ED Triage Notes (Signed)
 Pt arrived via POV. C/o back pain for 15 years. Pt states they are "immune to their pain pill now". Does not have a pain clinic.

## 2023-09-29 NOTE — ED Provider Notes (Signed)
 [place] EMERGENCY DEPARTMENT AT [facility] Provider Note   CSN: [id number] Arrival date & time: 09/29/23  0865     History  Chief Complaint  Patient presents with   Back Pain    Travis Nguyen is a 73 y.o. male.  HPI    73 year old male comes in with chief complaint of back pain.  Patient has chronic back pain disease and he also has multiple myeloma.  He is on 10 mg oxycodone.  He states that sometimes the pain is severe, he is to double up on his medications as they do not work.  He has run out of his medication.  The refill is coming up soon.  However he is having severe back pain and requesting IM medication.  He has come to the ER for same in the past and gets better with IM medication.  He is also requesting bridge prescription.  Home Medications Prior to Admission medications   Medication Sig Start Date End Date Taking? Authorizing Provider  acyclovir (ZOVIRAX) 400 MG tablet Take 1 tablet (400 mg total) by mouth 2 (two) times daily. Patient not taking: Reported on 02/14/2023 07/01/22   Benjiman Core, MD  amLODipine (NORVASC) 10 MG tablet Take 1 tablet by mouth once a day Patient not taking: Reported on 08/30/2022 04/24/21     amLODipine (NORVASC) 5 MG tablet Take 1 tablet (5 mg total) by mouth daily. Patient not taking: Reported on 08/30/2022 02/25/22     amLODipine (NORVASC) 5 MG tablet Take 1 tablet (5 mg total) by mouth daily. 08/12/22     amLODipine (NORVASC) 5 MG tablet Take 1 tablet (5 mg total) by mouth daily. 08/19/23     benzonatate (TESSALON) 100 MG capsule Take 1 capsule (100 mg total) by mouth every 8 (eight) hours. 05/25/23   Arabella Merles, PA-C  bisacodyl 5 MG EC tablet Take by mouth as directed. Patient not taking: Reported on 07/25/2023 10/17/22   [name], MD  Cyanocobalamin (VITAMIN B12 PO) Take by mouth daily.    [provider]  cyclobenzaprine (FLEXERIL) 5 MG tablet Take 1 - 2 tablets by mouth at bedtime as needed Patient not  taking: Reported on 07/25/2023 05/20/22     diazepam (VALIUM) 5 MG tablet Take 1 tablet (5 mg total) by mouth every 12 (twelve) hours as needed for muscle spasms. 03/31/23   [name], DO  escitalopram (LEXAPRO) 10 MG tablet Take 1 tablet by mouth once a day Patient not taking: Reported on 08/30/2022 04/24/21     escitalopram (LEXAPRO) 10 MG tablet Take 1 tablet by mouth once daily Patient not taking: Reported on 07/25/2023 07/24/21     escitalopram (LEXAPRO) 10 MG tablet Take 1 tablet (10 mg total) by mouth daily. Patient not taking: Reported on 07/25/2023 08/12/22     gabapentin (NEURONTIN) 100 MG capsule Take 1 capsule (100 mg total) by mouth at bedtime for numbness/tingling. 05/28/23     HYDROmorphone (DILAUDID) 4 MG tablet Take 1 tablet (4 mg) by mouth every 6 hours as needed for severe pain. Patient not taking: Reported on 07/25/2023 01/06/23   [name], MD  lidocaine-prilocaine (EMLA) cream Apply 1 Application topically once. Prior to port access Patient not taking: Reported on 07/25/2023    [provider]  lidocaine-prilocaine (EMLA) cream APPLY TOPICALLY TO PORT-A-CATH DAILY AS NEEDED 06/26/23 06/25/24  Johney Maine, MD  losartan (COZAAR) 100 MG tablet TAKE 1 TABLET BY MOUTH ONCE A DAY 06/06/20 06/06/21  [name], MD  losartan (COZAAR) 100 MG tablet Take 1 tablet by mouth once a day Patient not taking: Reported on 08/30/2022 04/24/21     losartan (COZAAR) 100 MG tablet Take 1 tablet by mouth daily Patient not taking: Reported on 08/30/2022 07/24/21     losartan (COZAAR) 100 MG tablet Take 1 tablet (100 mg total) by mouth daily. Patient not taking: Reported on 02/14/2023 02/25/22     losartan (COZAAR) 100 MG tablet Take 1 tablet (100 mg total) by mouth daily. Patient not taking: Reported on 07/25/2023 08/12/22     losartan (COZAAR) 100 MG tablet Take 1 tablet (100 mg total) by mouth daily. 08/19/23     methocarbamol (ROBAXIN) 500 MG tablet Take 1 tablet (500 mg) by  mouth every 8 hours as needed for muscle spasms. Patient not taking: Reported on 07/25/2023 03/01/23   Long, [name], MD  methocarbamol (ROBAXIN) 500 MG tablet Take 1 tablet (500 mg total) by mouth 2 (two) times daily. 04/01/23   Mannie Stabile, PA-C  methylPREDNISolone (MEDROL DOSEPAK) 4 MG TBPK tablet Take as directed per package instructions for 6 days for inflammation. 05/28/23     naproxen (NAPROSYN) 500 MG tablet Take 1 tablet (500 mg total) by mouth 2 (two) times daily. 06/25/23   Carmel Sacramento A, PA-C  oxyCODONE-acetaminophen (PERCOCET) 10-325 MG tablet Take 1 tablet by mouth 2 times a day Patient not taking: Reported on 08/30/2022 12/15/20     oxyCODONE-acetaminophen (PERCOCET) 10-325 MG tablet Take 1 tablet by mouth twice daily as needed (07/05/21) Patient not taking: Reported on 02/14/2023 07/02/21     oxyCODONE-acetaminophen (PERCOCET) 10-325 MG tablet Take 1 tablet by mouth 2 (two) times daily as needed. Patient not taking: Reported on 07/25/2023 08/03/21     oxyCODONE-acetaminophen (PERCOCET) 10-325 MG tablet Take 1 tablet by mouth 2 times a day as needed orally (ok to fill 30 days after last refill) 02/25/22     oxyCODONE-acetaminophen (PERCOCET) 10-325 MG tablet Take 1 tablet by mouth 2 (two) times daily as needed. Patient not taking: Reported on 07/25/2023 04/29/22     oxyCODONE-acetaminophen (PERCOCET) 10-325 MG tablet Take 1 tablet by mouth 2 (two) times daily. okay to fill 30 days after last refill Patient not taking: Reported on 07/25/2023 05/22/22     oxyCODONE-acetaminophen (PERCOCET) 10-325 MG tablet Take 1 tablet by mouth 2 (two) times daily as needed. (ok to fill 30 days after last refill) Patient not taking: Reported on 08/30/2022 06/27/22     oxyCODONE-acetaminophen (PERCOCET) 10-325 MG tablet Take 1 tablet by mouth 2 (two) times daily as needed Patient not taking: Reported on 10/09/2022 09/23/22     oxyCODONE-acetaminophen (PERCOCET) 10-325 MG tablet Take 1 tablet by mouth 2  (two) times daily as needed. Patient not taking: Reported on 02/14/2023 11/19/22     oxyCODONE-acetaminophen (PERCOCET) 10-325 MG tablet Take 1 tablet by mouth 2 times daily. Patient not taking: Reported on 02/14/2023 01/14/23     oxyCODONE-acetaminophen (PERCOCET) 10-325 MG tablet Take 1 tablet by mouth 2 (two) times daily as needed. Patient not taking: Reported on 03/11/2023 02/10/23     oxyCODONE-acetaminophen (PERCOCET) 10-325 MG tablet Take 1 tablet by mouth 2 (two) times daily as needed. 03/04/23     oxyCODONE-acetaminophen (PERCOCET) 10-325 MG tablet Take 1 tablet by mouth 2 (two) times daily. 03/13/23     oxyCODONE-acetaminophen (PERCOCET) 10-325 MG tablet Take 1 tablet by mouth 2 (two) times daily as needed. 04/08/23     oxyCODONE-acetaminophen (PERCOCET) 10-325  MG tablet Take 1 tablet by mouth 2 (two) times daily. 05/06/23     oxyCODONE-acetaminophen (PERCOCET) 10-325 MG tablet Take 1 tablet by mouth 2 (two) times daily. 05/29/23     oxyCODONE-acetaminophen (PERCOCET) 10-325 MG tablet Take 1 tablet by mouth 2 (two) times daily as needed. 06/24/23     oxyCODONE-acetaminophen (PERCOCET) 10-325 MG tablet Take 1 tablet by mouth 2 (two) times daily as needed. 07/31/23     oxyCODONE-acetaminophen (PERCOCET) 10-325 MG tablet Take 1 tablet by mouth 2 (two) times daily. 08/19/23     pantoprazole (PROTONIX) 20 MG tablet Take 1 tablet (20 mg total) by mouth daily. Patient not taking: Reported on 08/30/2022 01/07/21   Tilden Fossa, MD  polyethylene glycol-electrolytes (NULYTELY) 420 g solution Use as directed. Patient not taking: Reported on 07/25/2023 10/17/22   [name], MD  senna-docusate (SENOKOT-S) 8.6-50 MG tablet Take 1 tablet by mouth at bedtime as needed for mild constipation. Patient not taking: Reported on 07/25/2023 01/20/23   Long, [name], MD  trimethoprim-polymyxin b (POLYTRIM) ophthalmic solution Place 1 drop into both eyes every 4 (four) hours. Patient not taking: Reported on 07/25/2023 09/28/22    Ernie Avena, MD  vitamin C (ASCORBIC ACID) 500 MG tablet Take 500 mg by mouth daily.    [provider]      Allergies    Aspirin    Review of Systems   Review of Systems  All other systems reviewed and are negative.   Physical Exam Updated Vital Signs BP (!) 156/88   Pulse (!) 53   Temp 97.8 F (36.6 C) (Oral)   Resp 18   SpO2 100%  Physical Exam Vitals and nursing note reviewed.  Constitutional:      Appearance: He is well-developed.  HENT:     Head: Atraumatic.  Cardiovascular:     Rate and Rhythm: Normal rate.  Pulmonary:     Effort: Pulmonary effort is normal.  Musculoskeletal:     Cervical back: Neck supple.  Skin:    General: Skin is warm.  Neurological:     Mental Status: He is alert and oriented to person, place, and time.     ED Results / Procedures / Treatments   Labs (all labs ordered are listed, but only abnormal results are displayed) Labs Reviewed - No data to display  EKG None  Radiology No results found.  Procedures Procedures    Medications Ordered in ED Medications  HYDROmorphone (DILAUDID) injection 1 mg (has no administration in time range)    ED Course/ Medical Decision Making/ A&P                                 Medical Decision Making Risk Prescription drug management.   73 year old patient comes in with chief complaint of back pain.  I reviewed patient's record, he has multiple myeloma, prostate cancer, MGUS and chronic pain syndrome.  I reviewed patient's [place] controlled substance database and also reviewed his previous medical records with PCP and oncology.  It appears that patient has periodic visits to the ER for acute on chronic back pain.  I discussed with him the need for him to discuss poorly controlled pain with his prescriber, so that they can appropriately give him meds.  We will give him IM Dilaudid now.  I have declined bridging prescription given that he already has established  relationship with someone prescribing narcotic.  I have requested  that he calls them.  Final Clinical Impression(s) / ED Diagnoses Final diagnoses:  Chronic midline low back pain without sciatica    Rx / DC Orders ED Discharge Orders     None         Derwood Kaplan, MD 09/29/23 1039

## 2023-10-01 ENCOUNTER — Other Ambulatory Visit (HOSPITAL_COMMUNITY): Payer: Self-pay

## 2023-10-02 ENCOUNTER — Other Ambulatory Visit (HOSPITAL_COMMUNITY): Payer: Self-pay

## 2023-10-02 ENCOUNTER — Other Ambulatory Visit: Payer: Self-pay

## 2023-10-02 ENCOUNTER — Encounter (HOSPITAL_COMMUNITY): Payer: Self-pay | Admitting: Hematology

## 2023-10-02 DIAGNOSIS — C9001 Multiple myeloma in remission: Secondary | ICD-10-CM

## 2023-10-03 ENCOUNTER — Inpatient Hospital Stay

## 2023-10-03 ENCOUNTER — Other Ambulatory Visit: Payer: 59

## 2023-10-03 ENCOUNTER — Ambulatory Visit: Payer: 59

## 2023-10-03 VITALS — BP 159/80 | HR 64 | Temp 98.3°F | Resp 16 | Wt 145.0 lb

## 2023-10-03 DIAGNOSIS — C9001 Multiple myeloma in remission: Secondary | ICD-10-CM

## 2023-10-03 DIAGNOSIS — C9000 Multiple myeloma not having achieved remission: Secondary | ICD-10-CM | POA: Diagnosis not present

## 2023-10-03 DIAGNOSIS — Z95828 Presence of other vascular implants and grafts: Secondary | ICD-10-CM

## 2023-10-03 DIAGNOSIS — Z5112 Encounter for antineoplastic immunotherapy: Secondary | ICD-10-CM | POA: Diagnosis not present

## 2023-10-03 DIAGNOSIS — Z79620 Long term (current) use of immunosuppressive biologic: Secondary | ICD-10-CM | POA: Diagnosis not present

## 2023-10-03 LAB — CMP (CANCER CENTER ONLY)
ALT: 18 U/L (ref 0–44)
AST: 20 U/L (ref 15–41)
Albumin: 4 g/dL (ref 3.5–5.0)
Alkaline Phosphatase: 78 U/L (ref 38–126)
Anion gap: 3 — ABNORMAL LOW (ref 5–15)
BUN: 11 mg/dL (ref 8–23)
CO2: 29 mmol/L (ref 22–32)
Calcium: 8.7 mg/dL — ABNORMAL LOW (ref 8.9–10.3)
Chloride: 105 mmol/L (ref 98–111)
Creatinine: 0.82 mg/dL (ref 0.61–1.24)
GFR, Estimated: >60 mL/min (ref >60)
Glucose, Bld: 110 mg/dL — ABNORMAL HIGH (ref 70–99)
Potassium: 3.8 mmol/L (ref 3.5–5.1)
Sodium: 137 mmol/L (ref 135–145)
Total Bilirubin: 0.3 mg/dL (ref 0.0–1.2)
Total Protein: 7.6 g/dL (ref 6.5–8.1)

## 2023-10-03 LAB — CBC WITH DIFFERENTIAL (CANCER CENTER ONLY)
Abs Immature Granulocytes: 0 10*3/uL (ref 0.00–0.07)
Basophils Absolute: 0 10*3/uL (ref 0.0–0.1)
Basophils Relative: 0 %
Eosinophils Absolute: 0 10*3/uL (ref 0.0–0.5)
Eosinophils Relative: 1 %
HCT: 32 % — ABNORMAL LOW (ref 39.0–52.0)
Hemoglobin: 10.7 g/dL — ABNORMAL LOW (ref 13.0–17.0)
Immature Granulocytes: 0 %
Lymphocytes Relative: 41 %
Lymphs Abs: 1.7 10*3/uL (ref 0.7–4.0)
MCH: 32.5 pg (ref 26.0–34.0)
MCHC: 33.4 g/dL (ref 30.0–36.0)
MCV: 97.3 fL (ref 80.0–100.0)
Monocytes Absolute: 0.2 10*3/uL (ref 0.1–1.0)
Monocytes Relative: 6 %
Neutro Abs: 2.3 10*3/uL (ref 1.7–7.7)
Neutrophils Relative %: 52 %
Platelet Count: 149 10*3/uL — ABNORMAL LOW (ref 150–400)
RBC: 3.29 MIL/uL — ABNORMAL LOW (ref 4.22–5.81)
RDW: 13.4 % (ref 11.5–15.5)
WBC Count: 4.2 10*3/uL (ref 4.0–10.5)
nRBC: 0 % (ref 0.0–0.2)

## 2023-10-03 MED ORDER — ACETAMINOPHEN 325 MG PO TABS
650.0000 mg | ORAL_TABLET | Freq: Once | ORAL | Status: AC
  Administered 2023-10-03: 650 mg via ORAL
  Filled 2023-10-03: qty 2

## 2023-10-03 MED ORDER — DIPHENHYDRAMINE HCL 25 MG PO CAPS
50.0000 mg | ORAL_CAPSULE | Freq: Once | ORAL | Status: AC
  Administered 2023-10-03: 50 mg via ORAL
  Filled 2023-10-03: qty 2

## 2023-10-03 MED ORDER — HEPARIN SOD (PORK) LOCK FLUSH 100 UNIT/ML IV SOLN
500.0000 [IU] | Freq: Once | INTRAVENOUS | Status: AC | PRN
  Administered 2023-10-03: 500 [IU]

## 2023-10-03 MED ORDER — SODIUM CHLORIDE 0.9% FLUSH
10.0000 mL | INTRAVENOUS | Status: DC | PRN
  Administered 2023-10-03: 10 mL

## 2023-10-03 MED ORDER — DEXAMETHASONE 4 MG PO TABS
8.0000 mg | ORAL_TABLET | Freq: Once | ORAL | Status: AC
  Administered 2023-10-03: 8 mg via ORAL
  Filled 2023-10-03: qty 2

## 2023-10-03 MED ORDER — DARATUMUMAB-HYALURONIDASE-FIHJ 1800-30000 MG-UT/15ML ~~LOC~~ SOLN
1800.0000 mg | Freq: Once | SUBCUTANEOUS | Status: AC
  Administered 2023-10-03: 1800 mg via SUBCUTANEOUS
  Filled 2023-10-03: qty 15

## 2023-10-03 NOTE — Patient Instructions (Signed)
 [facility] - A DEPT OF [facility]  Discharge Instructions: Thank you for choosing [facility] to provide your oncology and hematology care.   If you have a lab appointment with the Cancer Center, please go directly to the Cancer Center and check in at the registration area.   Wear comfortable clothing and clothing appropriate for easy access to any Portacath or PICC line.   We strive to give you quality time with your provider. You may need to reschedule your appointment if you arrive late (15 or more minutes).  Arriving late affects you and other patients whose appointments are after yours.  Also, if you miss three or more appointments without notifying the office, you may be dismissed from the clinic at the provider's discretion.      For prescription refill requests, have your pharmacy contact our office and allow 72 hours for refills to be completed.    Today you received the following chemotherapy and/or immunotherapy agents: Darzalex Faspro      To help prevent nausea and vomiting after your treatment, we encourage you to take your nausea medication as directed.  BELOW ARE SYMPTOMS THAT SHOULD BE REPORTED IMMEDIATELY: *FEVER GREATER THAN 100.4 F (38 C) OR HIGHER *CHILLS OR SWEATING *NAUSEA AND VOMITING THAT IS NOT CONTROLLED WITH YOUR NAUSEA MEDICATION *UNUSUAL SHORTNESS OF BREATH *UNUSUAL BRUISING OR BLEEDING *URINARY PROBLEMS (pain or burning when urinating, or frequent urination) *BOWEL PROBLEMS (unusual diarrhea, constipation, pain near the anus) TENDERNESS IN MOUTH AND THROAT WITH OR WITHOUT PRESENCE OF ULCERS (sore throat, sores in mouth, or a toothache) UNUSUAL RASH, SWELLING OR PAIN  UNUSUAL VAGINAL DISCHARGE OR ITCHING   Items with * indicate a potential emergency and should be followed up as soon as possible or go to the Emergency Department if any problems should occur.  Please show the CHEMOTHERAPY ALERT CARD or  IMMUNOTHERAPY ALERT CARD at check-in to the Emergency Department and triage nurse.  Should you have questions after your visit or need to cancel or reschedule your appointment, please contact [facility] - A DEPT OF [facility]  Dept: [phone]  and follow the prompts.  Office hours are 8:00 a.m. to 4:30 p.m. Monday - Friday. Please note that voicemails left after 4:00 p.m. may not be returned until the following business day.  We are closed weekends and major holidays. You have access to a nurse at all times for urgent questions. Please call the main number to the clinic Dept: [phone] and follow the prompts.   For any non-urgent questions, you may also contact your provider using MyChart. We now offer e-Visits for anyone 50 and older to request care online for non-urgent symptoms. For details visit [email or web address].   Also download the MyChart app! Go to the app store, search "MyChart", open the app, select [place], and log in with your MyChart username and password.

## 2023-10-06 ENCOUNTER — Encounter (HOSPITAL_COMMUNITY): Payer: Self-pay | Admitting: Hematology

## 2023-10-07 LAB — MULTIPLE MYELOMA PANEL, SERUM
Albumin SerPl Elph-Mcnc: 4.1 g/dL (ref 2.9–4.4)
Albumin/Glob SerPl: 1.3 (ref 0.7–1.7)
Alpha 1: 0.2 g/dL (ref 0.0–0.4)
Alpha2 Glob SerPl Elph-Mcnc: 0.5 g/dL (ref 0.4–1.0)
B-Globulin SerPl Elph-Mcnc: 0.8 g/dL (ref 0.7–1.3)
Gamma Glob SerPl Elph-Mcnc: 1.9 g/dL — ABNORMAL HIGH (ref 0.4–1.8)
Globulin, Total: 3.4 g/dL (ref 2.2–3.9)
IgA: 16 mg/dL — ABNORMAL LOW (ref 61–437)
IgG (Immunoglobin G), Serum: 2187 mg/dL — ABNORMAL HIGH (ref 603–1613)
IgM (Immunoglobulin M), Srm: 12 mg/dL — ABNORMAL LOW (ref 20–172)
M Protein SerPl Elph-Mcnc: 1.6 g/dL — ABNORMAL HIGH
Total Protein ELP: 7.5 g/dL (ref 6.0–8.5)

## 2023-10-16 ENCOUNTER — Other Ambulatory Visit: Payer: Self-pay

## 2023-10-16 DIAGNOSIS — C9001 Multiple myeloma in remission: Secondary | ICD-10-CM
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
## 2023-10-17 ENCOUNTER — Inpatient Hospital Stay (HOSPITAL_BASED_OUTPATIENT_CLINIC_OR_DEPARTMENT_OTHER): Payer: 59 | Admitting: Hematology

## 2023-10-17 ENCOUNTER — Inpatient Hospital Stay: Payer: 59

## 2023-10-17 ENCOUNTER — Other Ambulatory Visit: Payer: Self-pay

## 2023-10-17 ENCOUNTER — Inpatient Hospital Stay: Payer: 59 | Attending: Oncology

## 2023-10-17 VITALS — BP 202/80 | HR 54 | Temp 98.2°F | Resp 18 | Wt 148.1 lb

## 2023-10-17 VITALS — BP 155/72 | HR 58

## 2023-10-17 DIAGNOSIS — Z87891 Personal history of nicotine dependence: Secondary | ICD-10-CM | POA: Diagnosis not present

## 2023-10-17 DIAGNOSIS — Z886 Allergy status to analgesic agent status: Secondary | ICD-10-CM | POA: Insufficient documentation

## 2023-10-17 DIAGNOSIS — Z8546 Personal history of malignant neoplasm of prostate: Secondary | ICD-10-CM | POA: Insufficient documentation

## 2023-10-17 DIAGNOSIS — C9001 Multiple myeloma in remission: Secondary | ICD-10-CM

## 2023-10-17 DIAGNOSIS — D649 Anemia, unspecified: Secondary | ICD-10-CM | POA: Diagnosis not present

## 2023-10-17 DIAGNOSIS — Z9049 Acquired absence of other specified parts of digestive tract: Secondary | ICD-10-CM | POA: Insufficient documentation

## 2023-10-17 DIAGNOSIS — M545 Low back pain, unspecified: Secondary | ICD-10-CM | POA: Insufficient documentation

## 2023-10-17 DIAGNOSIS — Z8616 Personal history of COVID-19: Secondary | ICD-10-CM | POA: Insufficient documentation

## 2023-10-17 DIAGNOSIS — C61 Malignant neoplasm of prostate: Secondary | ICD-10-CM | POA: Insufficient documentation

## 2023-10-17 DIAGNOSIS — Z8042 Family history of malignant neoplasm of prostate: Secondary | ICD-10-CM | POA: Diagnosis not present

## 2023-10-17 DIAGNOSIS — Z95828 Presence of other vascular implants and grafts: Secondary | ICD-10-CM

## 2023-10-17 DIAGNOSIS — Z8249 Family history of ischemic heart disease and other diseases of the circulatory system: Secondary | ICD-10-CM | POA: Insufficient documentation

## 2023-10-17 DIAGNOSIS — Z7969 Long term (current) use of other immunomodulators and immunosuppressants: Secondary | ICD-10-CM | POA: Diagnosis not present

## 2023-10-17 DIAGNOSIS — Z5112 Encounter for antineoplastic immunotherapy: Secondary | ICD-10-CM | POA: Diagnosis not present

## 2023-10-17 DIAGNOSIS — Z85820 Personal history of malignant melanoma of skin: Secondary | ICD-10-CM | POA: Insufficient documentation

## 2023-10-17 DIAGNOSIS — C9000 Multiple myeloma not having achieved remission: Secondary | ICD-10-CM | POA: Insufficient documentation

## 2023-10-17 DIAGNOSIS — Z79899 Other long term (current) drug therapy: Secondary | ICD-10-CM | POA: Insufficient documentation

## 2023-10-17 DIAGNOSIS — Z9079 Acquired absence of other genital organ(s): Secondary | ICD-10-CM | POA: Diagnosis not present

## 2023-10-17 DIAGNOSIS — Z79620 Long term (current) use of immunosuppressive biologic: Secondary | ICD-10-CM | POA: Insufficient documentation

## 2023-10-17 LAB — CBC WITH DIFFERENTIAL (CANCER CENTER ONLY)
Abs Immature Granulocytes: 0 10*3/uL (ref 0.00–0.07)
Basophils Absolute: 0 10*3/uL (ref 0.0–0.1)
Basophils Relative: 0 %
Eosinophils Absolute: 0 10*3/uL (ref 0.0–0.5)
Eosinophils Relative: 1 %
HCT: 32 % — ABNORMAL LOW (ref 39.0–52.0)
Hemoglobin: 10.9 g/dL — ABNORMAL LOW (ref 13.0–17.0)
Immature Granulocytes: 0 %
Lymphocytes Relative: 47 %
Lymphs Abs: 1.9 10*3/uL (ref 0.7–4.0)
MCH: 32.9 pg (ref 26.0–34.0)
MCHC: 34.1 g/dL (ref 30.0–36.0)
MCV: 96.7 fL (ref 80.0–100.0)
Monocytes Absolute: 0.3 10*3/uL (ref 0.1–1.0)
Monocytes Relative: 7 %
Neutro Abs: 1.8 10*3/uL (ref 1.7–7.7)
Neutrophils Relative %: 45 %
Platelet Count: 154 10*3/uL (ref 150–400)
RBC: 3.31 MIL/uL — ABNORMAL LOW (ref 4.22–5.81)
RDW: 13 % (ref 11.5–15.5)
WBC Count: 4.1 10*3/uL (ref 4.0–10.5)
nRBC: 0 % (ref 0.0–0.2)

## 2023-10-17 LAB — CMP (CANCER CENTER ONLY)
ALT: 11 U/L (ref 0–44)
AST: 16 U/L (ref 15–41)
Albumin: 4.1 g/dL (ref 3.5–5.0)
Alkaline Phosphatase: 82 U/L (ref 38–126)
Anion gap: 2 — ABNORMAL LOW (ref 5–15)
BUN: 12 mg/dL (ref 8–23)
CO2: 27 mmol/L (ref 22–32)
Calcium: 8.8 mg/dL — ABNORMAL LOW (ref 8.9–10.3)
Chloride: 104 mmol/L (ref 98–111)
Creatinine: 0.8 mg/dL (ref 0.61–1.24)
GFR, Estimated: >60 mL/min (ref >60)
Glucose, Bld: 96 mg/dL (ref 70–99)
Potassium: 4.2 mmol/L (ref 3.5–5.1)
Sodium: 133 mmol/L — ABNORMAL LOW (ref 135–145)
Total Bilirubin: 0.4 mg/dL (ref 0.0–1.2)
Total Protein: 7.8 g/dL (ref 6.5–8.1)

## 2023-10-17 MED ORDER — ACETAMINOPHEN 325 MG PO TABS
650.0000 mg | ORAL_TABLET | Freq: Once | ORAL | Status: AC
  Administered 2023-10-17: 650 mg via ORAL
  Filled 2023-10-17: qty 2

## 2023-10-17 MED ORDER — DIPHENHYDRAMINE HCL 25 MG PO CAPS
50.0000 mg | ORAL_CAPSULE | Freq: Once | ORAL | Status: AC
Start: 2023-10-17 — End: 2023-10-17
  Administered 2023-10-17: 50 mg via ORAL
  Filled 2023-10-17: qty 2

## 2023-10-17 MED ORDER — SODIUM CHLORIDE 0.9% FLUSH
10.0000 mL | INTRAVENOUS | Status: DC | PRN
  Administered 2023-10-17: 10 mL

## 2023-10-17 MED ORDER — CLONIDINE HCL 0.1 MG PO TABS
0.1000 mg | ORAL_TABLET | Freq: Once | ORAL | Status: AC
  Administered 2023-10-17: 0.1 mg via ORAL

## 2023-10-17 MED ORDER — HEPARIN SOD (PORK) LOCK FLUSH 100 UNIT/ML IV SOLN
500.0000 [IU] | Freq: Once | INTRAVENOUS | Status: AC | PRN
  Administered 2023-10-17: 500 [IU]

## 2023-10-17 MED ORDER — DARATUMUMAB-HYALURONIDASE-FIHJ 1800-30000 MG-UT/15ML ~~LOC~~ SOLN
1800.0000 mg | Freq: Once | SUBCUTANEOUS | Status: AC
  Administered 2023-10-17: 1800 mg via SUBCUTANEOUS
  Filled 2023-10-17: qty 15

## 2023-10-17 MED ORDER — DEXAMETHASONE 4 MG PO TABS
8.0000 mg | ORAL_TABLET | Freq: Once | ORAL | Status: AC
  Administered 2023-10-17: 8 mg via ORAL
  Filled 2023-10-17: qty 2

## 2023-10-17 NOTE — Progress Notes (Signed)
 Patient seen by Dr. Addison Naegeli are not all within treatment parameters. BP is high Dr Candise Che aware Please recheck in  infusion 202/80  Labs reviewed: and are within treatment parameters.  Per physician team, patient is ready for treatment. Please note that modifications are being made to the treatment plan including Recheck BP in infusion. Pt did not take BP meds today. Pt may need BP med

## 2023-10-17 NOTE — Progress Notes (Signed)
 Per Dr Candise Che: Clonidine 0.1mg  x 1 and rpt BP in 15 mins if still >170 Systolic additional 0.1mg  . Orders placed RN in infusion notified. Current BP 210/85

## 2023-10-24 ENCOUNTER — Encounter: Payer: Self-pay | Admitting: Hematology

## 2023-10-24 ENCOUNTER — Other Ambulatory Visit (HOSPITAL_COMMUNITY): Payer: Self-pay

## 2023-10-24 MED ORDER — OXYCODONE-ACETAMINOPHEN 10-325 MG PO TABS: 1.0000 | ORAL_TABLET | Freq: Two times a day (BID) | ORAL | 0 refills | Status: DC

## 2023-10-28 ENCOUNTER — Other Ambulatory Visit (HOSPITAL_COMMUNITY): Payer: Self-pay

## 2023-10-30 ENCOUNTER — Other Ambulatory Visit (HOSPITAL_COMMUNITY): Payer: Self-pay

## 2023-10-31 ENCOUNTER — Other Ambulatory Visit: Payer: Self-pay

## 2023-10-31 ENCOUNTER — Other Ambulatory Visit: Payer: Self-pay | Admitting: Hematology and Oncology

## 2023-10-31 ENCOUNTER — Inpatient Hospital Stay: Payer: 59

## 2023-10-31 ENCOUNTER — Other Ambulatory Visit (HOSPITAL_COMMUNITY): Payer: Self-pay

## 2023-10-31 ENCOUNTER — Inpatient Hospital Stay

## 2023-10-31 ENCOUNTER — Other Ambulatory Visit: Payer: Self-pay | Admitting: Physician Assistant

## 2023-10-31 VITALS — BP 176/82 | HR 51 | Temp 97.7°F | Resp 16 | Wt 147.5 lb

## 2023-10-31 DIAGNOSIS — C9001 Multiple myeloma in remission: Secondary | ICD-10-CM

## 2023-10-31 DIAGNOSIS — Z79620 Long term (current) use of immunosuppressive biologic: Secondary | ICD-10-CM | POA: Diagnosis not present

## 2023-10-31 DIAGNOSIS — Z9049 Acquired absence of other specified parts of digestive tract: Secondary | ICD-10-CM | POA: Diagnosis not present

## 2023-10-31 DIAGNOSIS — Z8616 Personal history of COVID-19: Secondary | ICD-10-CM | POA: Diagnosis not present

## 2023-10-31 DIAGNOSIS — D649 Anemia, unspecified: Secondary | ICD-10-CM | POA: Diagnosis not present

## 2023-10-31 DIAGNOSIS — Z85820 Personal history of malignant melanoma of skin: Secondary | ICD-10-CM | POA: Diagnosis not present

## 2023-10-31 DIAGNOSIS — Z5112 Encounter for antineoplastic immunotherapy: Secondary | ICD-10-CM | POA: Diagnosis not present

## 2023-10-31 DIAGNOSIS — Z8249 Family history of ischemic heart disease and other diseases of the circulatory system: Secondary | ICD-10-CM | POA: Diagnosis not present

## 2023-10-31 DIAGNOSIS — M545 Low back pain, unspecified: Secondary | ICD-10-CM | POA: Diagnosis not present

## 2023-10-31 DIAGNOSIS — Z7969 Long term (current) use of other immunomodulators and immunosuppressants: Secondary | ICD-10-CM | POA: Diagnosis not present

## 2023-10-31 DIAGNOSIS — Z87891 Personal history of nicotine dependence: Secondary | ICD-10-CM | POA: Diagnosis not present

## 2023-10-31 DIAGNOSIS — Z79899 Other long term (current) drug therapy: Secondary | ICD-10-CM | POA: Diagnosis not present

## 2023-10-31 DIAGNOSIS — Z886 Allergy status to analgesic agent status: Secondary | ICD-10-CM | POA: Diagnosis not present

## 2023-10-31 DIAGNOSIS — C9000 Multiple myeloma not having achieved remission: Secondary | ICD-10-CM | POA: Diagnosis not present

## 2023-10-31 DIAGNOSIS — Z95828 Presence of other vascular implants and grafts: Secondary | ICD-10-CM

## 2023-10-31 LAB — CBC WITH DIFFERENTIAL (CANCER CENTER ONLY)
Abs Immature Granulocytes: 0.02 10*3/uL (ref 0.00–0.07)
Basophils Absolute: 0 10*3/uL (ref 0.0–0.1)
Basophils Relative: 0 %
Eosinophils Absolute: 0 10*3/uL (ref 0.0–0.5)
Eosinophils Relative: 0 %
HCT: 33.9 % — ABNORMAL LOW (ref 39.0–52.0)
Hemoglobin: 11.6 g/dL — ABNORMAL LOW (ref 13.0–17.0)
Immature Granulocytes: 0 %
Lymphocytes Relative: 29 %
Lymphs Abs: 1.7 10*3/uL (ref 0.7–4.0)
MCH: 33 pg (ref 26.0–34.0)
MCHC: 34.2 g/dL (ref 30.0–36.0)
MCV: 96.6 fL (ref 80.0–100.0)
Monocytes Absolute: 0.3 10*3/uL (ref 0.1–1.0)
Monocytes Relative: 4 %
Neutro Abs: 3.9 10*3/uL (ref 1.7–7.7)
Neutrophils Relative %: 67 %
Platelet Count: 163 10*3/uL (ref 150–400)
RBC: 3.51 MIL/uL — ABNORMAL LOW (ref 4.22–5.81)
RDW: 13.2 % (ref 11.5–15.5)
WBC Count: 5.9 10*3/uL (ref 4.0–10.5)
nRBC: 0 % (ref 0.0–0.2)

## 2023-10-31 MED ORDER — HEPARIN SOD (PORK) LOCK FLUSH 100 UNIT/ML IV SOLN
500.0000 [IU] | Freq: Once | INTRAVENOUS | Status: AC
  Administered 2023-10-31: 500 [IU] via INTRAVENOUS

## 2023-10-31 MED ORDER — SODIUM CHLORIDE 0.9% FLUSH
10.0000 mL | Freq: Once | INTRAVENOUS | Status: AC
  Administered 2023-10-31: 10 mL via INTRAVENOUS

## 2023-10-31 MED ORDER — SODIUM CHLORIDE 0.9% FLUSH
10.0000 mL | INTRAVENOUS | Status: DC | PRN
  Administered 2023-10-31: 10 mL

## 2023-10-31 MED ORDER — DEXAMETHASONE 4 MG PO TABS
8.0000 mg | ORAL_TABLET | Freq: Once | ORAL | Status: AC
Start: 2023-10-31 — End: 2023-10-31
  Administered 2023-10-31: 8 mg via ORAL
  Filled 2023-10-31: qty 2

## 2023-10-31 MED ORDER — DIPHENHYDRAMINE HCL 25 MG PO CAPS
50.0000 mg | ORAL_CAPSULE | Freq: Once | ORAL | Status: AC
  Administered 2023-10-31: 50 mg via ORAL
  Filled 2023-10-31: qty 2

## 2023-10-31 MED ORDER — ACETAMINOPHEN 325 MG PO TABS
650.0000 mg | ORAL_TABLET | Freq: Once | ORAL | Status: AC
  Administered 2023-10-31: 650 mg via ORAL
  Filled 2023-10-31: qty 2

## 2023-10-31 MED ORDER — DARATUMUMAB-HYALURONIDASE-FIHJ 1800-30000 MG-UT/15ML ~~LOC~~ SOLN
1800.0000 mg | Freq: Once | SUBCUTANEOUS | Status: AC
  Administered 2023-10-31: 1800 mg via SUBCUTANEOUS
  Filled 2023-10-31: qty 15

## 2023-10-31 NOTE — Patient Instructions (Signed)
 [facility] - A DEPT OF [facility]  Discharge Instructions: Thank you for choosing [facility] to provide your oncology and hematology care.   If you have a lab appointment with the Cancer Center, please go directly to the Cancer Center and check in at the registration area.   Wear comfortable clothing and clothing appropriate for easy access to any Portacath or PICC line.   We strive to give you quality time with your provider. You may need to reschedule your appointment if you arrive late (15 or more minutes).  Arriving late affects you and other patients whose appointments are after yours.  Also, if you miss three or more appointments without notifying the office, you may be dismissed from the clinic at the provider's discretion.      For prescription refill requests, have your pharmacy contact our office and allow 72 hours for refills to be completed.    Today you received the following chemotherapy and/or immunotherapy agents: Darzalex Faspro      To help prevent nausea and vomiting after your treatment, we encourage you to take your nausea medication as directed.  BELOW ARE SYMPTOMS THAT SHOULD BE REPORTED IMMEDIATELY: *FEVER GREATER THAN 100.4 F (38 C) OR HIGHER *CHILLS OR SWEATING *NAUSEA AND VOMITING THAT IS NOT CONTROLLED WITH YOUR NAUSEA MEDICATION *UNUSUAL SHORTNESS OF BREATH *UNUSUAL BRUISING OR BLEEDING *URINARY PROBLEMS (pain or burning when urinating, or frequent urination) *BOWEL PROBLEMS (unusual diarrhea, constipation, pain near the anus) TENDERNESS IN MOUTH AND THROAT WITH OR WITHOUT PRESENCE OF ULCERS (sore throat, sores in mouth, or a toothache) UNUSUAL RASH, SWELLING OR PAIN  UNUSUAL VAGINAL DISCHARGE OR ITCHING   Items with * indicate a potential emergency and should be followed up as soon as possible or go to the Emergency Department if any problems should occur.  Please show the CHEMOTHERAPY ALERT CARD or  IMMUNOTHERAPY ALERT CARD at check-in to the Emergency Department and triage nurse.  Should you have questions after your visit or need to cancel or reschedule your appointment, please contact [facility] - A DEPT OF [facility]  Dept: [phone]  and follow the prompts.  Office hours are 8:00 a.m. to 4:30 p.m. Monday - Friday. Please note that voicemails left after 4:00 p.m. may not be returned until the following business day.  We are closed weekends and major holidays. You have access to a nurse at all times for urgent questions. Please call the main number to the clinic Dept: [phone] and follow the prompts.   For any non-urgent questions, you may also contact your provider using MyChart. We now offer e-Visits for anyone 50 and older to request care online for non-urgent symptoms. For details visit [email or web address].   Also download the MyChart app! Go to the app store, search "MyChart", open the app, select [place], and log in with your MyChart username and password.

## 2023-11-04 ENCOUNTER — Encounter: Payer: Self-pay | Admitting: Hematology

## 2023-11-04 NOTE — Progress Notes (Signed)
 This encounter was created in error - please disregard.

## 2023-11-12 ENCOUNTER — Emergency Department (HOSPITAL_COMMUNITY)
Admission: EM | Admit: 2023-11-12 | Discharge: 2023-11-12 | Disposition: A | Discharge status: Home / Self Care | Attending: Emergency Medicine | Admitting: Emergency Medicine

## 2023-11-12 ENCOUNTER — Emergency Department (HOSPITAL_COMMUNITY)

## 2023-11-12 DIAGNOSIS — Z8579 Personal history of other malignant neoplasms of lymphoid, hematopoietic and related tissues: Secondary | ICD-10-CM | POA: Diagnosis not present

## 2023-11-12 DIAGNOSIS — Z8546 Personal history of malignant neoplasm of prostate: Secondary | ICD-10-CM | POA: Diagnosis not present

## 2023-11-12 DIAGNOSIS — I10 Essential (primary) hypertension: Secondary | ICD-10-CM | POA: Diagnosis not present

## 2023-11-12 DIAGNOSIS — R197 Diarrhea, unspecified: Secondary | ICD-10-CM | POA: Diagnosis not present

## 2023-11-12 DIAGNOSIS — K5641 Fecal impaction: Secondary | ICD-10-CM | POA: Diagnosis not present

## 2023-11-12 DIAGNOSIS — Z79899 Other long term (current) drug therapy: Secondary | ICD-10-CM | POA: Insufficient documentation

## 2023-11-12 DIAGNOSIS — D1803 Hemangioma of intra-abdominal structures: Secondary | ICD-10-CM | POA: Diagnosis not present

## 2023-11-12 DIAGNOSIS — N281 Cyst of kidney, acquired: Secondary | ICD-10-CM | POA: Diagnosis not present

## 2023-11-12 DIAGNOSIS — Z9049 Acquired absence of other specified parts of digestive tract: Secondary | ICD-10-CM | POA: Diagnosis not present

## 2023-11-12 DIAGNOSIS — R109 Unspecified abdominal pain: Secondary | ICD-10-CM | POA: Diagnosis not present

## 2023-11-12 LAB — CBC
HCT: 32.1 % — ABNORMAL LOW (ref 39.0–52.0)
Hemoglobin: 10.9 g/dL — ABNORMAL LOW (ref 13.0–17.0)
MCH: 33.3 pg (ref 26.0–34.0)
MCHC: 34 g/dL (ref 30.0–36.0)
MCV: 98.2 fL (ref 80.0–100.0)
Platelets: 131 10*3/uL — ABNORMAL LOW (ref 150–400)
RBC: 3.27 MIL/uL — ABNORMAL LOW (ref 4.22–5.81)
RDW: 13 % (ref 11.5–15.5)
WBC: 5.2 10*3/uL (ref 4.0–10.5)
nRBC: 0 % (ref 0.0–0.2)

## 2023-11-12 LAB — URINALYSIS, ROUTINE W REFLEX MICROSCOPIC
Bilirubin Urine: NEGATIVE
Glucose, UA: NEGATIVE mg/dL
Hgb urine dipstick: NEGATIVE
Ketones, ur: NEGATIVE mg/dL
Leukocytes,Ua: NEGATIVE
Nitrite: NEGATIVE
Protein, ur: NEGATIVE mg/dL
Specific Gravity, Urine: 1.013 (ref 1.005–1.030)
pH: 5 (ref 5.0–8.0)

## 2023-11-12 LAB — COMPREHENSIVE METABOLIC PANEL WITH GFR
ALT: 12 U/L (ref 0–44)
AST: 19 U/L (ref 15–41)
Albumin: 3.5 g/dL (ref 3.5–5.0)
Alkaline Phosphatase: 66 U/L (ref 38–126)
Anion gap: 8 (ref 5–15)
BUN: 11 mg/dL (ref 8–23)
CO2: 22 mmol/L (ref 22–32)
Calcium: 8.9 mg/dL (ref 8.9–10.3)
Chloride: 106 mmol/L (ref 98–111)
Creatinine, Ser: 0.75 mg/dL (ref 0.61–1.24)
GFR, Estimated: >60 mL/min (ref >60)
Glucose, Bld: 105 mg/dL — ABNORMAL HIGH (ref 70–99)
Potassium: 3.6 mmol/L (ref 3.5–5.1)
Sodium: 136 mmol/L (ref 135–145)
Total Bilirubin: 0.6 mg/dL (ref 0.0–1.2)
Total Protein: 7 g/dL (ref 6.5–8.1)

## 2023-11-12 LAB — LIPASE, BLOOD: Lipase: 31 U/L (ref 11–51)

## 2023-11-12 MED ORDER — OXYCODONE HCL 5 MG PO TABS
10.0000 mg | ORAL_TABLET | Freq: Once | ORAL | Status: AC
  Administered 2023-11-12: 10 mg via ORAL
  Filled 2023-11-12: qty 2

## 2023-11-12 MED ORDER — IOHEXOL 350 MG/ML SOLN
75.0000 mL | Freq: Once | INTRAVENOUS | Status: AC | PRN
  Administered 2023-11-12: 75 mL via INTRAVENOUS

## 2023-11-12 MED ORDER — DOCUSATE SODIUM 100 MG PO CAPS
100.0000 mg | ORAL_CAPSULE | Freq: Two times a day (BID) | ORAL | 0 refills | Status: DC
  Filled 2023-11-12: qty 60, 30d supply, fill #0

## 2023-11-12 MED ORDER — FLEET ENEMA RE ENEM
1.0000 | ENEMA | Freq: Once | RECTAL | Status: DC
  Filled 2023-11-12: qty 1

## 2023-11-12 MED ORDER — GLYCERIN (ADULT) 2 G RE SUPP
1.0000 | RECTAL | 0 refills | Status: DC | PRN
  Filled 2023-11-12: qty 12, 30d supply, fill #0

## 2023-11-12 MED ORDER — LIDOCAINE HCL URETHRAL/MUCOSAL 2 % EX GEL
1.0000 | Freq: Once | CUTANEOUS | Status: DC
  Filled 2023-11-12: qty 11

## 2023-11-12 NOTE — ED Notes (Signed)
 Please give an update to [phone] caroline [place]aston (sister)

## 2023-11-12 NOTE — ED Triage Notes (Signed)
 PT complains generalized abd pain and diarrhea for 3 days. States can't keep anything on stomach.

## 2023-11-12 NOTE — ED Provider Notes (Signed)
 [facility] Provider Note   CSN: [id number] Arrival date & time: 11/12/23  1456     History  Chief Complaint  Patient presents with   Abdominal Pain   Diarrhea    KELSEY CALVI is a 73 y.o. male.  History of multiple myeloma, current on maintenance therapy, diagnosed 2014 relapse 2022, history of prostate cancer, history of hypertension --presents to the emergency department for ongoing diarrhea.  Patient states that he has watery stool after he eats and drinks.  He denies vomiting.  No fevers.  No recent antibiotic use.  No recent travel.  No focal abdominal pain.  Does report ongoing back pain that is chronic.  He takes oxycodone  chronically.       Home Medications Prior to Admission medications   Medication Sig Start Date End Date Taking? Authorizing Provider  acyclovir  (ZOVIRAX ) 400 MG tablet Take 1 tablet (400 mg total) by mouth 2 (two) times daily. Patient not taking: Reported on 10/17/2023 07/01/22   [name], Firas N, MD  amLODipine  (NORVASC ) 10 MG tablet Take 1 tablet by mouth once a day 04/24/21     amLODipine  (NORVASC ) 5 MG tablet Take 1 tablet (5 mg total) by mouth daily. Patient not taking: Reported on 10/17/2023 02/25/22     amLODipine  (NORVASC ) 5 MG tablet Take 1 tablet (5 mg total) by mouth daily. Patient not taking: Reported on 10/17/2023 08/12/22     amLODipine  (NORVASC ) 5 MG tablet Take 1 tablet (5 mg total) by mouth daily. 08/19/23     benzonatate  (TESSALON ) 100 MG capsule Take 1 capsule (100 mg total) by mouth every 8 (eight) hours. 05/25/23   Rexie Catena, PA-C  bisacodyl  5 MG EC tablet Take by mouth as directed. Patient not taking: Reported on 02/14/2023 10/17/22   Ozell Blunt, MD  Cyanocobalamin (VITAMIN B12 PO) Take by mouth daily.    [provider]  cyclobenzaprine  (FLEXERIL ) 5 MG tablet Take 1 - 2 tablets by mouth at bedtime as needed Patient not taking: Reported on 10/09/2022 05/20/22     diazepam   (VALIUM ) 5 MG tablet Take 1 tablet (5 mg total) by mouth every 12 (twelve) hours as needed for muscle spasms. 03/31/23   Sallyanne Creamer, DO  escitalopram  (LEXAPRO ) 10 MG tablet Take 1 tablet by mouth once a day Patient not taking: Reported on 08/30/2022 04/24/21     escitalopram  (LEXAPRO ) 10 MG tablet Take 1 tablet by mouth once daily Patient not taking: Reported on 08/30/2022 07/24/21     escitalopram  (LEXAPRO ) 10 MG tablet Take 1 tablet (10 mg total) by mouth daily. Patient not taking: Reported on 02/14/2023 08/12/22     gabapentin  (NEURONTIN ) 100 MG capsule Take 1 capsule (100 mg total) by mouth at bedtime for numbness/tingling. 05/28/23     HYDROmorphone  (DILAUDID ) 4 MG tablet Take 1 tablet (4 mg) by mouth every 6 hours as needed for severe pain. Patient not taking: Reported on 02/14/2023 01/06/23   Zammit, Joseph, MD  lidocaine -prilocaine  (EMLA ) cream Apply 1 Application topically once. Prior to port access Patient not taking: Reported on 10/17/2023    [provider]  lidocaine -prilocaine  (EMLA ) cream APPLY TOPICALLY TO PORT-A-CATH DAILY AS NEEDED 06/26/23 06/25/24  Frankie Israel, MD  losartan  (COZAAR ) 100 MG tablet TAKE 1 TABLET BY MOUTH ONCE A DAY 06/06/20 10/17/23  Rae Bugler, MD  losartan  (COZAAR ) 100 MG tablet Take 1 tablet by mouth once a day Patient not taking: Reported on 08/30/2022 04/24/21  losartan  (COZAAR ) 100 MG tablet Take 1 tablet by mouth daily Patient not taking: Reported on 08/30/2022 07/24/21     losartan  (COZAAR ) 100 MG tablet Take 1 tablet (100 mg total) by mouth daily. Patient not taking: Reported on 02/14/2023 02/25/22     losartan  (COZAAR ) 100 MG tablet Take 1 tablet (100 mg total) by mouth daily. Patient not taking: Reported on 07/25/2023 08/12/22     losartan  (COZAAR ) 100 MG tablet Take 1 tablet (100 mg total) by mouth daily. 08/19/23     methocarbamol  (ROBAXIN ) 500 MG tablet Take 1 tablet (500 mg) by mouth every 8 hours as needed for muscle spasms. Patient not  taking: Reported on 07/25/2023 03/01/23   Long, Shereen Dike, MD  methocarbamol  (ROBAXIN ) 500 MG tablet Take 1 tablet (500 mg total) by mouth 2 (two) times daily. 04/01/23   Lear Prosper, PA-C  methylPREDNISolone  (MEDROL  DOSEPAK) 4 MG TBPK tablet Take as directed per package instructions for 6 days for inflammation. Patient not taking: Reported on 10/17/2023 05/28/23     naproxen  (NAPROSYN ) 500 MG tablet Take 1 tablet (500 mg total) by mouth 2 (two) times daily. 06/25/23   Baxter Limber A, PA-C  oxyCODONE -acetaminophen  (PERCOCET ) 10-325 MG tablet Take 1 tablet by mouth 2 times a day Patient not taking: Reported on 08/30/2022 12/15/20     oxyCODONE -acetaminophen  (PERCOCET ) 10-325 MG tablet Take 1 tablet by mouth twice daily as needed (07/05/21) Patient not taking: Reported on 02/14/2023 07/02/21     oxyCODONE -acetaminophen  (PERCOCET ) 10-325 MG tablet Take 1 tablet by mouth 2 (two) times daily as needed. Patient not taking: Reported on 07/25/2023 08/03/21     oxyCODONE -acetaminophen  (PERCOCET ) 10-325 MG tablet Take 1 tablet by mouth 2 times a day as needed orally (ok to fill 30 days after last refill) 02/25/22     oxyCODONE -acetaminophen  (PERCOCET ) 10-325 MG tablet Take 1 tablet by mouth 2 (two) times daily as needed. Patient not taking: Reported on 07/25/2023 04/29/22     oxyCODONE -acetaminophen  (PERCOCET ) 10-325 MG tablet Take 1 tablet by mouth 2 (two) times daily. okay to fill 30 days after last refill Patient not taking: Reported on 07/25/2023 05/22/22     oxyCODONE -acetaminophen  (PERCOCET ) 10-325 MG tablet Take 1 tablet by mouth 2 (two) times daily as needed. (ok to fill 30 days after last refill) Patient not taking: Reported on 10/17/2023 06/27/22     oxyCODONE -acetaminophen  (PERCOCET ) 10-325 MG tablet Take 1 tablet by mouth 2 (two) times daily as needed Patient not taking: Reported on 10/17/2023 09/23/22     oxyCODONE -acetaminophen  (PERCOCET ) 10-325 MG tablet Take 1 tablet by mouth 2 (two) times daily as  needed. Patient not taking: Reported on 10/17/2023 11/19/22     oxyCODONE -acetaminophen  (PERCOCET ) 10-325 MG tablet Take 1 tablet by mouth 2 times daily. Patient not taking: Reported on 10/17/2023 01/14/23     oxyCODONE -acetaminophen  (PERCOCET ) 10-325 MG tablet Take 1 tablet by mouth 2 (two) times daily as needed. Patient not taking: Reported on 10/17/2023 02/10/23     oxyCODONE -acetaminophen  (PERCOCET ) 10-325 MG tablet Take 1 tablet by mouth 2 (two) times daily as needed. Patient not taking: Reported on 10/17/2023 03/04/23     oxyCODONE -acetaminophen  (PERCOCET ) 10-325 MG tablet Take 1 tablet by mouth 2 (two) times daily. Patient not taking: Reported on 10/17/2023 03/13/23     oxyCODONE -acetaminophen  (PERCOCET ) 10-325 MG tablet Take 1 tablet by mouth 2 (two) times daily as needed. Patient not taking: Reported on 10/17/2023 04/08/23     oxyCODONE -acetaminophen  (PERCOCET ) 10-325 MG tablet Take 1  tablet by mouth 2 (two) times daily. Patient not taking: Reported on 10/17/2023 05/06/23     oxyCODONE -acetaminophen  (PERCOCET ) 10-325 MG tablet Take 1 tablet by mouth 2 (two) times daily. Patient not taking: Reported on 10/17/2023 05/29/23     oxyCODONE -acetaminophen  (PERCOCET ) 10-325 MG tablet Take 1 tablet by mouth 2 (two) times daily as needed. Patient not taking: Reported on 10/17/2023 06/24/23     oxyCODONE -acetaminophen  (PERCOCET ) 10-325 MG tablet Take 1 tablet by mouth 2 (two) times daily as needed. Patient not taking: Reported on 10/17/2023 07/31/23     oxyCODONE -acetaminophen  (PERCOCET ) 10-325 MG tablet Take 1 tablet by mouth 2 (two) times daily. Patient not taking: Reported on 10/17/2023 08/19/23     oxyCODONE -acetaminophen  (PERCOCET ) 10-325 MG tablet Take 1 tablet by mouth 2 (two) times daily. 09/29/23     oxyCODONE -acetaminophen  (PERCOCET ) 10-325 MG tablet Take 1 tablet by mouth in the morning and at bedtime. may fill 30 days after last script 10/24/23     pantoprazole  (PROTONIX ) 20 MG tablet Take 1 tablet (20 mg total) by  mouth daily. Patient not taking: Reported on 08/30/2022 01/07/21   Kelsey Patricia, MD  polyethylene glycol-electrolytes (NULYTELY ) 420 g solution Use as directed. Patient not taking: Reported on 07/25/2023 10/17/22   Ozell Blunt, MD  senna-docusate (SENOKOT-S) 8.6-50 MG tablet Take 1 tablet by mouth at bedtime as needed for mild constipation. Patient not taking: Reported on 07/25/2023 01/20/23   Long, Ameliah Baskins G, MD  trimethoprim -polymyxin b  (POLYTRIM ) ophthalmic solution Place 1 drop into both eyes every 4 (four) hours. Patient not taking: Reported on 07/25/2023 09/28/22   Rosealee Concha, MD  vitamin C (ASCORBIC ACID) 500 MG tablet Take 500 mg by mouth daily.    [provider]      Allergies    Aspirin    Review of Systems   Review of Systems  Physical Exam Updated Vital Signs BP (!) 145/80 (BP Location: Right Arm)   Pulse 70   Temp 98.5 F (36.9 C)   Resp 17   SpO2 100%  Physical Exam Vitals and nursing note reviewed.  Constitutional:      General: He is not in acute distress.    Appearance: He is well-developed.  HENT:     Head: Normocephalic and atraumatic.     Mouth/Throat:     Mouth: Mucous membranes are moist.     Comments: Appears well-hydrated. Eyes:     General:        Right eye: No discharge.        Left eye: No discharge.     Conjunctiva/sclera: Conjunctivae normal.  Cardiovascular:     Rate and Rhythm: Normal rate and regular rhythm.     Heart sounds: Normal heart sounds.  Pulmonary:     Effort: Pulmonary effort is normal.     Breath sounds: Normal breath sounds.  Abdominal:     Palpations: Abdomen is soft.     Tenderness: There is no abdominal tenderness. There is no guarding or rebound. Negative signs include Murphy's sign and McBurney's sign.  Musculoskeletal:     Cervical back: Normal range of motion and neck supple.  Skin:    General: Skin is warm and dry.  Neurological:     Mental Status: He is alert.     ED Results / Procedures / Treatments    Labs (all labs ordered are listed, but only abnormal results are displayed) Labs Reviewed  COMPREHENSIVE METABOLIC PANEL WITH GFR - Abnormal; Notable for the following components:  Result Value   Glucose, Bld 105 (*)    All other components within normal limits  CBC - Abnormal; Notable for the following components:   RBC 3.27 (*)    Hemoglobin 10.9 (*)    HCT 32.1 (*)    Platelets 131 (*)    All other components within normal limits  LIPASE, BLOOD  URINALYSIS, ROUTINE W REFLEX MICROSCOPIC    EKG None  Radiology No results found.  Procedures .Fecal disimpaction  Date/Time: 11/12/2023 9:35 PM  Performed by: Lyna Sandhoff, PA-C Authorized by: Lyna Sandhoff, PA-C  Consent: Verbal consent obtained. Consent given by: patient Patient identity confirmed: verbally with patient and provided demographic data Local anesthesia used: yes  Anesthesia: Local anesthesia used: yes Local anesthetic: lidocaine  jelly.  Sedation: Patient sedated: no  Patient tolerance: patient tolerated the procedure well with no immediate complications Comments: Urojet applied, 84F coude cath passed posteriorly, balloon inflated and fleet enema administered. Dr. Delana Favors supervising.        Medications Ordered in ED Medications - No data to display  ED Course/ Medical Decision Making/ A&P    Patient seen and examined. History obtained directly from patient. Work-up including labs, imaging, EKG ordered in triage, if performed, were reviewed.    Labs/EKG: Independently reviewed and interpreted.  This included: CBC with slightly low hemoglobin at 10.9, normal white blood cell count otherwise unremarkable; CMP unremarkable; lipase normal; UA unremarkable.  Imaging: CT abdomen pelvis ordered.  Medications/Fluids: None ordered  Most recent vital signs reviewed and are as follows: BP (!) 145/80 (BP Location: Right Arm)   Pulse 70   Temp 98.5 F (36.9 C)   Resp 17   SpO2 100%   Initial  impression: Well-appearing patient with several days of diarrhea, maintenance with multiple myeloma.  Awaiting imaging.  10:12 PM I removed catheter and patient currently having BM on bedside commode.   Dr. Delana Favors will discharge.                                 Medical Decision Making Amount and/or Complexity of Data Reviewed Labs: ordered. Radiology: ordered.  Risk OTC drugs. Prescription drug management.   For this patient's complaint of abdominal pain, the following conditions were considered on the differential diagnosis: gastritis/PUD, enteritis/duodenitis, appendicitis, cholelithiasis/cholecystitis, cholangitis, pancreatitis, ruptured viscus, colitis, diverticulitis, small/large bowel obstruction, proctitis, cystitis, pyelonephritis, ureteral colic, aortic dissection, aortic aneurysm. Atypical chest etiologies were also considered including ACS, PE, and pneumonia.  Fecal impaction noted on CT with some stercoral colitis. Vitals and labs are reassuring. Fecal impaction treated as above. Pt tolerated well.         Final Clinical Impression(s) / ED Diagnoses Final diagnoses:  Fecal impaction [facility])    Rx / DC Orders ED Discharge Orders          Ordered    glycerin adult 2 g suppository  As needed        11/12/23 2134    docusate sodium  (COLACE) 100 MG capsule  Every 12 hours        11/12/23 2134              Lyna Sandhoff, PA-C 11/12/23 2213    Dalene Duck, MD 11/12/23 2236

## 2023-11-12 NOTE — Discharge Instructions (Signed)
 Please read and follow all provided instructions.  Your diagnoses today include:  1. Fecal impaction (HCC)     Tests performed today include: Complete blood cell count: No concerning findings Complete metabolic panel: Was normal Lipase (pancreas function test): Was normal CT scan: Showed fecal impaction with inflammation of the rectum Vital signs. See below for your results today.   Medications prescribed:  Colace - stool softener  This medication can be found over-the-counter.   Continue colace for 2 weeks after your stools return to normal to prevent constipation.   Glycerin suppository: to help to lubricate if constipated  Take any prescribed medications only as directed.  Home care instructions:  Follow any educational materials contained in this packet.  Follow-up instructions: Please follow-up with your primary care provider in the next 3 days for further evaluation of your symptoms.    Return instructions:  SEEK IMMEDIATE MEDICAL ATTENTION IF: The pain does not go away or becomes severe  A temperature above 101F develops  Repeated vomiting occurs (multiple episodes)  The pain becomes localized to portions of the abdomen. The right side could possibly be appendicitis. In an adult, the left lower portion of the abdomen could be colitis or diverticulitis.  Blood is being passed in stools or vomit (bright red or black tarry stools)  You develop chest pain, difficulty breathing, dizziness or fainting, or become confused, poorly responsive, or inconsolable (young children) If you have any other emergent concerns regarding your health  Additional Information: Abdominal (belly) pain can be caused by many things. Your caregiver performed an examination and possibly ordered blood/urine tests and imaging (CT scan, x-rays, ultrasound). Many cases can be observed and treated at home after initial evaluation in the emergency department. Even though you are being discharged home,  abdominal pain can be unpredictable. Therefore, you need a repeated exam if your pain does not resolve, returns, or worsens. Most patients with abdominal pain don't have to be admitted to the hospital or have surgery, but serious problems like appendicitis and gallbladder attacks can start out as nonspecific pain. Many abdominal conditions cannot be diagnosed in one visit, so follow-up evaluations are very important.  Your vital signs today were: BP (!) 190/91 (BP Location: Right Arm)   Pulse (!) 53   Temp 98.7 F (37.1 C) (Oral)   Resp 16   SpO2 100%  If your blood pressure (bp) was elevated above 135/85 this visit, please have this repeated by your doctor within one month. --------------

## 2023-11-13 ENCOUNTER — Other Ambulatory Visit (HOSPITAL_COMMUNITY): Payer: Self-pay

## 2023-11-13 ENCOUNTER — Encounter: Payer: Self-pay | Admitting: Hematology

## 2023-11-13 ENCOUNTER — Other Ambulatory Visit: Payer: Self-pay

## 2023-11-13 DIAGNOSIS — C9001 Multiple myeloma in remission: Secondary | ICD-10-CM

## 2023-11-14 ENCOUNTER — Inpatient Hospital Stay: Payer: 59

## 2023-11-14 ENCOUNTER — Other Ambulatory Visit (HOSPITAL_COMMUNITY): Payer: Self-pay

## 2023-11-14 ENCOUNTER — Inpatient Hospital Stay: Payer: 59 | Attending: Oncology

## 2023-11-14 ENCOUNTER — Inpatient Hospital Stay (HOSPITAL_BASED_OUTPATIENT_CLINIC_OR_DEPARTMENT_OTHER): Payer: 59 | Admitting: Hematology

## 2023-11-14 VITALS — BP 159/74 | HR 80 | Temp 98.9°F | Resp 18 | Ht 73.0 in | Wt 146.9 lb

## 2023-11-14 DIAGNOSIS — Z95828 Presence of other vascular implants and grafts: Secondary | ICD-10-CM

## 2023-11-14 DIAGNOSIS — Z5112 Encounter for antineoplastic immunotherapy: Secondary | ICD-10-CM | POA: Insufficient documentation

## 2023-11-14 DIAGNOSIS — C9001 Multiple myeloma in remission: Secondary | ICD-10-CM

## 2023-11-14 DIAGNOSIS — Z79620 Long term (current) use of immunosuppressive biologic: Secondary | ICD-10-CM | POA: Insufficient documentation

## 2023-11-14 DIAGNOSIS — Z79899 Other long term (current) drug therapy: Secondary | ICD-10-CM | POA: Insufficient documentation

## 2023-11-14 LAB — CBC WITH DIFFERENTIAL (CANCER CENTER ONLY)
Abs Immature Granulocytes: 0.01 10*3/uL (ref 0.00–0.07)
Basophils Absolute: 0 10*3/uL (ref 0.0–0.1)
Basophils Relative: 0 %
Eosinophils Absolute: 0.1 10*3/uL (ref 0.0–0.5)
Eosinophils Relative: 2 %
HCT: 31.8 % — ABNORMAL LOW (ref 39.0–52.0)
Hemoglobin: 11 g/dL — ABNORMAL LOW (ref 13.0–17.0)
Immature Granulocytes: 0 %
Lymphocytes Relative: 39 %
Lymphs Abs: 1.7 10*3/uL (ref 0.7–4.0)
MCH: 32.9 pg (ref 26.0–34.0)
MCHC: 34.6 g/dL (ref 30.0–36.0)
MCV: 95.2 fL (ref 80.0–100.0)
Monocytes Absolute: 0.2 10*3/uL (ref 0.1–1.0)
Monocytes Relative: 5 %
Neutro Abs: 2.3 10*3/uL (ref 1.7–7.7)
Neutrophils Relative %: 54 %
Platelet Count: 149 10*3/uL — ABNORMAL LOW (ref 150–400)
RBC: 3.34 MIL/uL — ABNORMAL LOW (ref 4.22–5.81)
RDW: 13.1 % (ref 11.5–15.5)
WBC Count: 4.3 10*3/uL (ref 4.0–10.5)
nRBC: 0 % (ref 0.0–0.2)

## 2023-11-14 LAB — CMP (CANCER CENTER ONLY)
ALT: 11 U/L (ref 0–44)
AST: 18 U/L (ref 15–41)
Albumin: 4 g/dL (ref 3.5–5.0)
Alkaline Phosphatase: 89 U/L (ref 38–126)
Anion gap: 5 (ref 5–15)
BUN: 9 mg/dL (ref 8–23)
CO2: 26 mmol/L (ref 22–32)
Calcium: 8.7 mg/dL — ABNORMAL LOW (ref 8.9–10.3)
Chloride: 105 mmol/L (ref 98–111)
Creatinine: 0.77 mg/dL (ref 0.61–1.24)
GFR, Estimated: >60 mL/min (ref >60)
Glucose, Bld: 108 mg/dL — ABNORMAL HIGH (ref 70–99)
Potassium: 3.6 mmol/L (ref 3.5–5.1)
Sodium: 136 mmol/L (ref 135–145)
Total Bilirubin: 0.3 mg/dL (ref 0.0–1.2)
Total Protein: 7.6 g/dL (ref 6.5–8.1)

## 2023-11-14 MED ORDER — DIPHENHYDRAMINE HCL 25 MG PO CAPS
50.0000 mg | ORAL_CAPSULE | Freq: Once | ORAL | Status: AC
  Administered 2023-11-14: 50 mg via ORAL
  Filled 2023-11-14: qty 2

## 2023-11-14 MED ORDER — ACETAMINOPHEN 500 MG PO TABS
1000.0000 mg | ORAL_TABLET | Freq: Once | ORAL | Status: AC
Start: 2023-11-14 — End: 2023-11-14
  Administered 2023-11-14: 1000 mg via ORAL
  Filled 2023-11-14: qty 2

## 2023-11-14 MED ORDER — SODIUM CHLORIDE 0.9% FLUSH
10.0000 mL | INTRAVENOUS | Status: DC | PRN
  Administered 2023-11-14: 10 mL

## 2023-11-14 MED ORDER — HEPARIN SOD (PORK) LOCK FLUSH 100 UNIT/ML IV SOLN
500.0000 [IU] | Freq: Once | INTRAVENOUS | Status: AC | PRN
  Administered 2023-11-14: 500 [IU]

## 2023-11-14 MED ORDER — DEXAMETHASONE 4 MG PO TABS
8.0000 mg | ORAL_TABLET | Freq: Once | ORAL | Status: AC
  Administered 2023-11-14: 8 mg via ORAL
  Filled 2023-11-14: qty 2

## 2023-11-14 MED ORDER — DARATUMUMAB-HYALURONIDASE-FIHJ 1800-30000 MG-UT/15ML ~~LOC~~ SOLN
1800.0000 mg | Freq: Once | SUBCUTANEOUS | Status: AC
  Administered 2023-11-14: 1800 mg via SUBCUTANEOUS
  Filled 2023-11-14: qty 15

## 2023-11-14 NOTE — Patient Instructions (Signed)
 [facility] - A DEPT OF [facility]  Discharge Instructions: Thank you for choosing [facility] to provide your oncology and hematology care.   If you have a lab appointment with the Cancer Center, please go directly to the Cancer Center and check in at the registration area.   Wear comfortable clothing and clothing appropriate for easy access to any Portacath or PICC line.   We strive to give you quality time with your provider. You may need to reschedule your appointment if you arrive late (15 or more minutes).  Arriving late affects you and other patients whose appointments are after yours.  Also, if you miss three or more appointments without notifying the office, you may be dismissed from the clinic at the provider's discretion.      For prescription refill requests, have your pharmacy contact our office and allow 72 hours for refills to be completed.    Today you received the following chemotherapy and/or immunotherapy agents: Darzalex Faspro      To help prevent nausea and vomiting after your treatment, we encourage you to take your nausea medication as directed.  BELOW ARE SYMPTOMS THAT SHOULD BE REPORTED IMMEDIATELY: *FEVER GREATER THAN 100.4 F (38 C) OR HIGHER *CHILLS OR SWEATING *NAUSEA AND VOMITING THAT IS NOT CONTROLLED WITH YOUR NAUSEA MEDICATION *UNUSUAL SHORTNESS OF BREATH *UNUSUAL BRUISING OR BLEEDING *URINARY PROBLEMS (pain or burning when urinating, or frequent urination) *BOWEL PROBLEMS (unusual diarrhea, constipation, pain near the anus) TENDERNESS IN MOUTH AND THROAT WITH OR WITHOUT PRESENCE OF ULCERS (sore throat, sores in mouth, or a toothache) UNUSUAL RASH, SWELLING OR PAIN  UNUSUAL VAGINAL DISCHARGE OR ITCHING   Items with * indicate a potential emergency and should be followed up as soon as possible or go to the Emergency Department if any problems should occur.  Please show the CHEMOTHERAPY ALERT CARD or  IMMUNOTHERAPY ALERT CARD at check-in to the Emergency Department and triage nurse.  Should you have questions after your visit or need to cancel or reschedule your appointment, please contact [facility] - A DEPT OF [facility]  Dept: [phone]  and follow the prompts.  Office hours are 8:00 a.m. to 4:30 p.m. Monday - Friday. Please note that voicemails left after 4:00 p.m. may not be returned until the following business day.  We are closed weekends and major holidays. You have access to a nurse at all times for urgent questions. Please call the main number to the clinic Dept: [phone] and follow the prompts.   For any non-urgent questions, you may also contact your provider using MyChart. We now offer e-Visits for anyone 50 and older to request care online for non-urgent symptoms. For details visit [email or web address].   Also download the MyChart app! Go to the app store, search "MyChart", open the app, select [place], and log in with your MyChart username and password.

## 2023-11-17 ENCOUNTER — Other Ambulatory Visit (HOSPITAL_COMMUNITY): Payer: Self-pay

## 2023-11-17 LAB — MULTIPLE MYELOMA PANEL, SERUM
Albumin SerPl Elph-Mcnc: 3.7 g/dL (ref 2.9–4.4)
Albumin/Glob SerPl: 1.1 (ref 0.7–1.7)
Alpha 1: 0.2 g/dL (ref 0.0–0.4)
Alpha2 Glob SerPl Elph-Mcnc: 0.7 g/dL (ref 0.4–1.0)
B-Globulin SerPl Elph-Mcnc: 0.7 g/dL (ref 0.7–1.3)
Gamma Glob SerPl Elph-Mcnc: 1.9 g/dL — ABNORMAL HIGH (ref 0.4–1.8)
Globulin, Total: 3.5 g/dL (ref 2.2–3.9)
IgA: 14 mg/dL — ABNORMAL LOW (ref 61–437)
IgG (Immunoglobin G), Serum: 2259 mg/dL — ABNORMAL HIGH (ref 603–1613)
IgM (Immunoglobulin M), Srm: 12 mg/dL — ABNORMAL LOW (ref 20–172)
M Protein SerPl Elph-Mcnc: 1.8 g/dL — ABNORMAL HIGH
Total Protein ELP: 7.2 g/dL (ref 6.0–8.5)

## 2023-11-18 ENCOUNTER — Other Ambulatory Visit (HOSPITAL_COMMUNITY): Payer: Self-pay

## 2023-11-18 MED ORDER — OXYCODONE-ACETAMINOPHEN 10-325 MG PO TABS
1.0000 | ORAL_TABLET | Freq: Two times a day (BID) | ORAL | 0 refills | Status: DC | PRN
  Filled 2023-11-18 – 2023-11-27 (×4): qty 60, 30d supply, fill #0

## 2023-11-19 ENCOUNTER — Other Ambulatory Visit (HOSPITAL_COMMUNITY): Payer: Self-pay

## 2023-11-20 ENCOUNTER — Other Ambulatory Visit (HOSPITAL_COMMUNITY): Payer: Self-pay

## 2023-11-24 ENCOUNTER — Other Ambulatory Visit (HOSPITAL_COMMUNITY): Payer: Self-pay

## 2023-11-25 ENCOUNTER — Other Ambulatory Visit: Payer: Self-pay

## 2023-11-27 ENCOUNTER — Other Ambulatory Visit (HOSPITAL_COMMUNITY): Payer: Self-pay

## 2023-11-27 ENCOUNTER — Other Ambulatory Visit: Payer: Self-pay

## 2023-11-28 ENCOUNTER — Inpatient Hospital Stay

## 2023-11-28 ENCOUNTER — Other Ambulatory Visit: Payer: Self-pay | Admitting: Hematology

## 2023-11-28 ENCOUNTER — Encounter: Payer: Self-pay | Admitting: Hematology

## 2023-11-28 ENCOUNTER — Other Ambulatory Visit (HOSPITAL_COMMUNITY): Payer: Self-pay

## 2023-11-28 VITALS — BP 179/88 | HR 56 | Temp 98.6°F | Resp 16 | Wt 144.4 lb

## 2023-11-28 DIAGNOSIS — Z79620 Long term (current) use of immunosuppressive biologic: Secondary | ICD-10-CM | POA: Diagnosis not present

## 2023-11-28 DIAGNOSIS — Z95828 Presence of other vascular implants and grafts: Secondary | ICD-10-CM

## 2023-11-28 DIAGNOSIS — Z79899 Other long term (current) drug therapy: Secondary | ICD-10-CM | POA: Diagnosis not present

## 2023-11-28 DIAGNOSIS — Z5112 Encounter for antineoplastic immunotherapy: Secondary | ICD-10-CM | POA: Diagnosis not present

## 2023-11-28 DIAGNOSIS — C9001 Multiple myeloma in remission: Secondary | ICD-10-CM

## 2023-11-28 LAB — COMPREHENSIVE METABOLIC PANEL WITH GFR
ALT: 9 U/L (ref 0–44)
AST: 15 U/L (ref 15–41)
Albumin: 4 g/dL (ref 3.5–5.0)
Alkaline Phosphatase: 75 U/L (ref 38–126)
Anion gap: 2 — ABNORMAL LOW (ref 5–15)
BUN: 10 mg/dL (ref 8–23)
CO2: 27 mmol/L (ref 22–32)
Calcium: 8.9 mg/dL (ref 8.9–10.3)
Chloride: 106 mmol/L (ref 98–111)
Creatinine, Ser: 0.76 mg/dL (ref 0.61–1.24)
GFR, Estimated: >60 mL/min (ref >60)
Glucose, Bld: 90 mg/dL (ref 70–99)
Potassium: 4.4 mmol/L (ref 3.5–5.1)
Sodium: 135 mmol/L (ref 135–145)
Total Bilirubin: 0.4 mg/dL (ref 0.0–1.2)
Total Protein: 7.8 g/dL (ref 6.5–8.1)

## 2023-11-28 LAB — CBC WITH DIFFERENTIAL (CANCER CENTER ONLY)
Abs Immature Granulocytes: 0 10*3/uL (ref 0.00–0.07)
Basophils Absolute: 0 10*3/uL (ref 0.0–0.1)
Basophils Relative: 0 %
Eosinophils Absolute: 0 10*3/uL (ref 0.0–0.5)
Eosinophils Relative: 1 %
HCT: 33.1 % — ABNORMAL LOW (ref 39.0–52.0)
Hemoglobin: 11.3 g/dL — ABNORMAL LOW (ref 13.0–17.0)
Immature Granulocytes: 0 %
Lymphocytes Relative: 46 %
Lymphs Abs: 1.8 10*3/uL (ref 0.7–4.0)
MCH: 32.4 pg (ref 26.0–34.0)
MCHC: 34.1 g/dL (ref 30.0–36.0)
MCV: 94.8 fL (ref 80.0–100.0)
Monocytes Absolute: 0.2 10*3/uL (ref 0.1–1.0)
Monocytes Relative: 6 %
Neutro Abs: 1.9 10*3/uL (ref 1.7–7.7)
Neutrophils Relative %: 47 %
Platelet Count: 169 10*3/uL (ref 150–400)
RBC: 3.49 MIL/uL — ABNORMAL LOW (ref 4.22–5.81)
RDW: 13 % (ref 11.5–15.5)
WBC Count: 4 10*3/uL (ref 4.0–10.5)
nRBC: 0 % (ref 0.0–0.2)

## 2023-11-28 MED ORDER — SODIUM CHLORIDE 0.9% FLUSH
10.0000 mL | INTRAVENOUS | Status: DC | PRN
  Administered 2023-11-28: 10 mL

## 2023-11-28 MED ORDER — DEXTROSE 5 % IV SOLN
20.0000 mg/m2 | Freq: Once | INTRAVENOUS | Status: AC
Start: 2023-11-28 — End: 2023-11-28
  Administered 2023-11-28: 40 mg via INTRAVENOUS
  Filled 2023-11-28: qty 15

## 2023-11-28 MED ORDER — SODIUM CHLORIDE 0.9 % IV SOLN: INTRAVENOUS | Status: AC

## 2023-11-28 MED ORDER — DEXAMETHASONE 4 MG PO TABS
8.0000 mg | ORAL_TABLET | Freq: Once | ORAL | Status: AC
  Administered 2023-11-28: 8 mg via ORAL
  Filled 2023-11-28: qty 2

## 2023-11-28 MED ORDER — ACETAMINOPHEN 500 MG PO TABS
1000.0000 mg | ORAL_TABLET | Freq: Once | ORAL | Status: AC
  Administered 2023-11-28: 1000 mg via ORAL
  Filled 2023-11-28: qty 2

## 2023-11-28 MED ORDER — DIPHENHYDRAMINE HCL 25 MG PO CAPS
50.0000 mg | ORAL_CAPSULE | Freq: Once | ORAL | Status: AC
  Administered 2023-11-28: 50 mg via ORAL
  Filled 2023-11-28: qty 2

## 2023-11-28 MED ORDER — DARATUMUMAB-HYALURONIDASE-FIHJ 1800-30000 MG-UT/15ML ~~LOC~~ SOLN
1800.0000 mg | Freq: Once | SUBCUTANEOUS | Status: AC
  Administered 2023-11-28: 1800 mg via SUBCUTANEOUS
  Filled 2023-11-28: qty 15

## 2023-11-28 MED ORDER — ACYCLOVIR 400 MG PO TABS
400.0000 mg | ORAL_TABLET | Freq: Two times a day (BID) | ORAL | 11 refills | Status: AC
  Filled 2023-11-28: qty 60, 30d supply, fill #0
  Filled 2023-12-24: qty 60, 30d supply, fill #1
  Filled 2024-02-02: qty 60, 30d supply, fill #2
  Filled 2024-02-09 – 2024-03-12 (×2): qty 60, 30d supply, fill #3
  Filled 2024-04-10 (×2): qty 60, 30d supply, fill #4
  Filled 2024-05-07: qty 60, 30d supply, fill #5
  Filled 2024-06-19: qty 60, 30d supply, fill #6
  Filled 2024-08-06: qty 60, 30d supply, fill #7

## 2023-11-28 NOTE — Patient Instructions (Signed)
 [facility] - A DEPT OF [facility]  Discharge Instructions: Thank you for choosing [facility] to provide your oncology and hematology care.   If you have a lab appointment with the Cancer Center, please go directly to the Cancer Center and check in at the registration area.   Wear comfortable clothing and clothing appropriate for easy access to any Portacath or PICC line.   We strive to give you quality time with your provider. You may need to reschedule your appointment if you arrive late (15 or more minutes).  Arriving late affects you and other patients whose appointments are after yours.  Also, if you miss three or more appointments without notifying the office, you may be dismissed from the clinic at the provider's discretion.      For prescription refill requests, have your pharmacy contact our office and allow 72 hours for refills to be completed.    Today you received the following chemotherapy and/or immunotherapy agents: Kyprolis , Darzalex  Faspro      To help prevent nausea and vomiting after your treatment, we encourage you to take your nausea medication as directed.  BELOW ARE SYMPTOMS THAT SHOULD BE REPORTED IMMEDIATELY: *FEVER GREATER THAN 100.4 F (38 C) OR HIGHER *CHILLS OR SWEATING *NAUSEA AND VOMITING THAT IS NOT CONTROLLED WITH YOUR NAUSEA MEDICATION *UNUSUAL SHORTNESS OF BREATH *UNUSUAL BRUISING OR BLEEDING *URINARY PROBLEMS (pain or burning when urinating, or frequent urination) *BOWEL PROBLEMS (unusual diarrhea, constipation, pain near the anus) TENDERNESS IN MOUTH AND THROAT WITH OR WITHOUT PRESENCE OF ULCERS (sore throat, sores in mouth, or a toothache) UNUSUAL RASH, SWELLING OR PAIN  UNUSUAL VAGINAL DISCHARGE OR ITCHING   Items with * indicate a potential emergency and should be followed up as soon as possible or go to the Emergency Department if any problems should occur.  Please show the CHEMOTHERAPY ALERT CARD  or IMMUNOTHERAPY ALERT CARD at check-in to the Emergency Department and triage nurse.  Should you have questions after your visit or need to cancel or reschedule your appointment, please contact [facility] - A DEPT OF [facility]  Dept: [phone]  and follow the prompts.  Office hours are 8:00 a.m. to 4:30 p.m. Monday - Friday. Please note that voicemails left after 4:00 p.m. may not be returned until the following business day.  We are closed weekends and major holidays. You have access to a nurse at all times for urgent questions. Please call the main number to the clinic Dept: [phone] and follow the prompts.   For any non-urgent questions, you may also contact your provider using MyChart. We now offer e-Visits for anyone 92 and older to request care online for non-urgent symptoms. For details visit [email or web address].   Also download the MyChart app! Go to the app store, search "MyChart", open the app, select [place], and log in with your MyChart username and password.

## 2023-11-29 ENCOUNTER — Other Ambulatory Visit (HOSPITAL_COMMUNITY): Payer: Self-pay

## 2023-12-01 ENCOUNTER — Other Ambulatory Visit (HOSPITAL_COMMUNITY): Payer: Self-pay

## 2023-12-02 LAB — MULTIPLE MYELOMA PANEL, SERUM
Albumin SerPl Elph-Mcnc: 3.7 g/dL (ref 2.9–4.4)
Albumin/Glob SerPl: 1.1 (ref 0.7–1.7)
Alpha 1: 0.2 g/dL (ref 0.0–0.4)
Alpha2 Glob SerPl Elph-Mcnc: 0.6 g/dL (ref 0.4–1.0)
B-Globulin SerPl Elph-Mcnc: 0.8 g/dL (ref 0.7–1.3)
Gamma Glob SerPl Elph-Mcnc: 2 g/dL — ABNORMAL HIGH (ref 0.4–1.8)
Globulin, Total: 3.6 g/dL (ref 2.2–3.9)
IgA: 14 mg/dL — ABNORMAL LOW (ref 61–437)
IgG (Immunoglobin G), Serum: 2446 mg/dL — ABNORMAL HIGH (ref 603–1613)
IgM (Immunoglobulin M), Srm: 27 mg/dL (ref 20–172)
M Protein SerPl Elph-Mcnc: 1.6 g/dL — ABNORMAL HIGH
Total Protein ELP: 7.3 g/dL (ref 6.0–8.5)

## 2023-12-05 ENCOUNTER — Inpatient Hospital Stay

## 2023-12-05 VITALS — BP 160/87 | HR 58 | Temp 97.8°F | Resp 18 | Wt 144.1 lb

## 2023-12-05 DIAGNOSIS — Z95828 Presence of other vascular implants and grafts: Secondary | ICD-10-CM

## 2023-12-05 DIAGNOSIS — Z5112 Encounter for antineoplastic immunotherapy: Secondary | ICD-10-CM | POA: Diagnosis not present

## 2023-12-05 DIAGNOSIS — C9001 Multiple myeloma in remission: Secondary | ICD-10-CM | POA: Diagnosis not present

## 2023-12-05 DIAGNOSIS — C9000 Multiple myeloma not having achieved remission: Secondary | ICD-10-CM

## 2023-12-05 DIAGNOSIS — Z79620 Long term (current) use of immunosuppressive biologic: Secondary | ICD-10-CM | POA: Diagnosis not present

## 2023-12-05 DIAGNOSIS — Z79899 Other long term (current) drug therapy: Secondary | ICD-10-CM | POA: Diagnosis not present

## 2023-12-05 LAB — COMPREHENSIVE METABOLIC PANEL WITH GFR
ALT: 27 U/L (ref 0–44)
AST: 20 U/L (ref 15–41)
Albumin: 4.1 g/dL (ref 3.5–5.0)
Alkaline Phosphatase: 96 U/L (ref 38–126)
Anion gap: 3 — ABNORMAL LOW (ref 5–15)
BUN: 11 mg/dL (ref 8–23)
CO2: 28 mmol/L (ref 22–32)
Calcium: 9.3 mg/dL (ref 8.9–10.3)
Chloride: 104 mmol/L (ref 98–111)
Creatinine, Ser: 0.82 mg/dL (ref 0.61–1.24)
GFR, Estimated: >60 mL/min (ref >60)
Glucose, Bld: 99 mg/dL (ref 70–99)
Potassium: 4.4 mmol/L (ref 3.5–5.1)
Sodium: 135 mmol/L (ref 135–145)
Total Bilirubin: 0.3 mg/dL (ref 0.0–1.2)
Total Protein: 7.8 g/dL (ref 6.5–8.1)

## 2023-12-05 LAB — CBC WITH DIFFERENTIAL (CANCER CENTER ONLY)
Abs Immature Granulocytes: 0.01 10*3/uL (ref 0.00–0.07)
Basophils Absolute: 0 10*3/uL (ref 0.0–0.1)
Basophils Relative: 0 %
Eosinophils Absolute: 0 10*3/uL (ref 0.0–0.5)
Eosinophils Relative: 0 %
HCT: 32.5 % — ABNORMAL LOW (ref 39.0–52.0)
Hemoglobin: 11.2 g/dL — ABNORMAL LOW (ref 13.0–17.0)
Immature Granulocytes: 0 %
Lymphocytes Relative: 53 %
Lymphs Abs: 1.9 10*3/uL (ref 0.7–4.0)
MCH: 32.9 pg (ref 26.0–34.0)
MCHC: 34.5 g/dL (ref 30.0–36.0)
MCV: 95.6 fL (ref 80.0–100.0)
Monocytes Absolute: 0.3 10*3/uL (ref 0.1–1.0)
Monocytes Relative: 7 %
Neutro Abs: 1.5 10*3/uL — ABNORMAL LOW (ref 1.7–7.7)
Neutrophils Relative %: 40 %
Platelet Count: 150 10*3/uL (ref 150–400)
RBC: 3.4 MIL/uL — ABNORMAL LOW (ref 4.22–5.81)
RDW: 12.7 % (ref 11.5–15.5)
WBC Count: 3.7 10*3/uL — ABNORMAL LOW (ref 4.0–10.5)
nRBC: 0 % (ref 0.0–0.2)

## 2023-12-05 MED ORDER — ACETAMINOPHEN 500 MG PO TABS
1000.0000 mg | ORAL_TABLET | Freq: Once | ORAL | Status: AC
  Administered 2023-12-05: 1000 mg via ORAL
  Filled 2023-12-05: qty 2

## 2023-12-05 MED ORDER — DIPHENHYDRAMINE HCL 25 MG PO CAPS
50.0000 mg | ORAL_CAPSULE | Freq: Once | ORAL | Status: AC
  Administered 2023-12-05: 50 mg via ORAL
  Filled 2023-12-05: qty 2

## 2023-12-05 MED ORDER — HEPARIN SOD (PORK) LOCK FLUSH 100 UNIT/ML IV SOLN
500.0000 [IU] | Freq: Once | INTRAVENOUS | Status: AC | PRN
  Administered 2023-12-05: 500 [IU]

## 2023-12-05 MED ORDER — DEXAMETHASONE 4 MG PO TABS
8.0000 mg | ORAL_TABLET | Freq: Once | ORAL | Status: AC
  Administered 2023-12-05: 8 mg via ORAL
  Filled 2023-12-05: qty 2

## 2023-12-05 MED ORDER — SODIUM CHLORIDE 0.9 % IV SOLN: INTRAVENOUS | Status: AC

## 2023-12-05 MED ORDER — CARFILZOMIB CHEMO INJECTION 60 MG
27.0000 mg/m2 | Freq: Once | INTRAVENOUS | Status: AC
  Administered 2023-12-05: 50 mg via INTRAVENOUS
  Filled 2023-12-05: qty 15

## 2023-12-05 MED ORDER — SODIUM CHLORIDE 0.9% FLUSH
10.0000 mL | INTRAVENOUS | Status: DC | PRN
  Administered 2023-12-05: 10 mL

## 2023-12-05 NOTE — Progress Notes (Signed)
 Ok to proceed with cmet pending per Dr Salomon Cree

## 2023-12-05 NOTE — Patient Instructions (Signed)

## 2023-12-08 LAB — MULTIPLE MYELOMA PANEL, SERUM
Albumin SerPl Elph-Mcnc: 3.7 g/dL (ref 2.9–4.4)
Albumin/Glob SerPl: 1.1 (ref 0.7–1.7)
Alpha 1: 0.2 g/dL (ref 0.0–0.4)
Alpha2 Glob SerPl Elph-Mcnc: 0.6 g/dL (ref 0.4–1.0)
B-Globulin SerPl Elph-Mcnc: 0.8 g/dL (ref 0.7–1.3)
Gamma Glob SerPl Elph-Mcnc: 2 g/dL — ABNORMAL HIGH (ref 0.4–1.8)
Globulin, Total: 3.7 g/dL (ref 2.2–3.9)
IgA: 12 mg/dL — ABNORMAL LOW (ref 61–437)
IgG (Immunoglobin G), Serum: 2484 mg/dL — ABNORMAL HIGH (ref 603–1613)
IgM (Immunoglobulin M), Srm: 7 mg/dL — ABNORMAL LOW (ref 20–172)
M Protein SerPl Elph-Mcnc: 1.7 g/dL — ABNORMAL HIGH
Total Protein ELP: 7.4 g/dL (ref 6.0–8.5)

## 2023-12-09 ENCOUNTER — Other Ambulatory Visit (HOSPITAL_COMMUNITY): Payer: Self-pay

## 2023-12-09 ENCOUNTER — Other Ambulatory Visit: Payer: Self-pay

## 2023-12-09 DIAGNOSIS — C9001 Multiple myeloma in remission: Secondary | ICD-10-CM
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
## 2023-12-12 ENCOUNTER — Other Ambulatory Visit: Payer: Self-pay

## 2023-12-12 ENCOUNTER — Inpatient Hospital Stay

## 2023-12-12 ENCOUNTER — Other Ambulatory Visit (HOSPITAL_COMMUNITY): Payer: Self-pay

## 2023-12-12 ENCOUNTER — Inpatient Hospital Stay (HOSPITAL_BASED_OUTPATIENT_CLINIC_OR_DEPARTMENT_OTHER): Admitting: Hematology

## 2023-12-12 VITALS — BP 163/74 | HR 60

## 2023-12-12 VITALS — BP 183/95 | HR 62 | Temp 97.8°F | Resp 20 | Wt 144.6 lb

## 2023-12-12 DIAGNOSIS — Z5112 Encounter for antineoplastic immunotherapy: Secondary | ICD-10-CM | POA: Diagnosis not present

## 2023-12-12 DIAGNOSIS — C9001 Multiple myeloma in remission: Secondary | ICD-10-CM

## 2023-12-12 DIAGNOSIS — Z95828 Presence of other vascular implants and grafts: Secondary | ICD-10-CM

## 2023-12-12 DIAGNOSIS — Z79620 Long term (current) use of immunosuppressive biologic: Secondary | ICD-10-CM | POA: Diagnosis not present

## 2023-12-12 DIAGNOSIS — Z79899 Other long term (current) drug therapy: Secondary | ICD-10-CM | POA: Diagnosis not present

## 2023-12-12 LAB — CBC WITH DIFFERENTIAL (CANCER CENTER ONLY)
Abs Immature Granulocytes: 0.01 10*3/uL (ref 0.00–0.07)
Basophils Absolute: 0 10*3/uL (ref 0.0–0.1)
Basophils Relative: 0 %
Eosinophils Absolute: 0 10*3/uL (ref 0.0–0.5)
Eosinophils Relative: 0 %
HCT: 32.4 % — ABNORMAL LOW (ref 39.0–52.0)
Hemoglobin: 11.3 g/dL — ABNORMAL LOW (ref 13.0–17.0)
Immature Granulocytes: 0 %
Lymphocytes Relative: 45 %
Lymphs Abs: 2.1 10*3/uL (ref 0.7–4.0)
MCH: 32.8 pg (ref 26.0–34.0)
MCHC: 34.9 g/dL (ref 30.0–36.0)
MCV: 94.2 fL (ref 80.0–100.0)
Monocytes Absolute: 0.3 10*3/uL (ref 0.1–1.0)
Monocytes Relative: 7 %
Neutro Abs: 2.2 10*3/uL (ref 1.7–7.7)
Neutrophils Relative %: 48 %
Platelet Count: 159 10*3/uL (ref 150–400)
RBC: 3.44 MIL/uL — ABNORMAL LOW (ref 4.22–5.81)
RDW: 12.5 % (ref 11.5–15.5)
WBC Count: 4.6 10*3/uL (ref 4.0–10.5)
nRBC: 0 % (ref 0.0–0.2)

## 2023-12-12 LAB — CMP (CANCER CENTER ONLY)
ALT: 12 U/L (ref 0–44)
AST: 17 U/L (ref 15–41)
Albumin: 4.1 g/dL (ref 3.5–5.0)
Alkaline Phosphatase: 83 U/L (ref 38–126)
Anion gap: 4 — ABNORMAL LOW (ref 5–15)
BUN: 6 mg/dL — ABNORMAL LOW (ref 8–23)
CO2: 27 mmol/L (ref 22–32)
Calcium: 8.9 mg/dL (ref 8.9–10.3)
Chloride: 104 mmol/L (ref 98–111)
Creatinine: 0.76 mg/dL (ref 0.61–1.24)
GFR, Estimated: >60 mL/min (ref >60)
Glucose, Bld: 87 mg/dL (ref 70–99)
Potassium: 4.5 mmol/L (ref 3.5–5.1)
Sodium: 135 mmol/L (ref 135–145)
Total Bilirubin: 0.5 mg/dL (ref 0.0–1.2)
Total Protein: 7.8 g/dL (ref 6.5–8.1)

## 2023-12-12 MED ORDER — DARATUMUMAB-HYALURONIDASE-FIHJ 1800-30000 MG-UT/15ML ~~LOC~~ SOLN
1800.0000 mg | Freq: Once | SUBCUTANEOUS | Status: AC
  Administered 2023-12-12: 1800 mg via SUBCUTANEOUS
  Filled 2023-12-12: qty 15

## 2023-12-12 MED ORDER — ACETAMINOPHEN 500 MG PO TABS
1000.0000 mg | ORAL_TABLET | Freq: Once | ORAL | Status: AC
  Administered 2023-12-12: 1000 mg via ORAL
  Filled 2023-12-12: qty 2

## 2023-12-12 MED ORDER — DEXAMETHASONE 4 MG PO TABS
8.0000 mg | ORAL_TABLET | Freq: Once | ORAL | Status: AC
  Administered 2023-12-12: 8 mg via ORAL
  Filled 2023-12-12: qty 2

## 2023-12-12 MED ORDER — DEXTROSE 5 % IV SOLN
36.0000 mg/m2 | Freq: Once | INTRAVENOUS | Status: AC
  Administered 2023-12-12: 70 mg via INTRAVENOUS
  Filled 2023-12-12: qty 30

## 2023-12-12 MED ORDER — SODIUM CHLORIDE 0.9% FLUSH
10.0000 mL | INTRAVENOUS | Status: DC | PRN
  Administered 2023-12-12: 10 mL

## 2023-12-12 MED ORDER — DIPHENHYDRAMINE HCL 25 MG PO CAPS
50.0000 mg | ORAL_CAPSULE | Freq: Once | ORAL | Status: AC
  Administered 2023-12-12: 50 mg via ORAL
  Filled 2023-12-12: qty 2

## 2023-12-12 MED ORDER — SODIUM CHLORIDE 0.9% FLUSH: 10.0000 mL | INTRAVENOUS | Status: DC | PRN

## 2023-12-12 MED ORDER — HEPARIN SOD (PORK) LOCK FLUSH 100 UNIT/ML IV SOLN
500.0000 [IU] | Freq: Once | INTRAVENOUS | Status: AC | PRN
  Administered 2023-12-12: 500 [IU]

## 2023-12-12 MED ORDER — SODIUM CHLORIDE 0.9 % IV SOLN: INTRAVENOUS | Status: AC

## 2023-12-12 NOTE — Patient Instructions (Addendum)
 [facility] - A DEPT OF [facility]  Discharge Instructions: Thank you for choosing [facility] to provide your oncology and hematology care.   If you have a lab appointment with the Cancer Center, please go directly to the Cancer Center and check in at the registration area.   Wear comfortable clothing and clothing appropriate for easy access to any Portacath or PICC line.   We strive to give you quality time with your provider. You may need to reschedule your appointment if you arrive late (15 or more minutes).  Arriving late affects you and other patients whose appointments are after yours.  Also, if you miss three or more appointments without notifying the office, you may be dismissed from the clinic at the provider's discretion.      For prescription refill requests, have your pharmacy contact our office and allow 72 hours for refills to be completed.    Today you received the following chemotherapy and/or immunotherapy agents: carfilzomib  (KYPROLIS ) and daratumumab -hyaluronidase -fihj (DARZALEX  FASPRO    To help prevent nausea and vomiting after your treatment, we encourage you to take your nausea medication as directed.  BELOW ARE SYMPTOMS THAT SHOULD BE REPORTED IMMEDIATELY: *FEVER GREATER THAN 100.4 F (38 C) OR HIGHER *CHILLS OR SWEATING *NAUSEA AND VOMITING THAT IS NOT CONTROLLED WITH YOUR NAUSEA MEDICATION *UNUSUAL SHORTNESS OF BREATH *UNUSUAL BRUISING OR BLEEDING *URINARY PROBLEMS (pain or burning when urinating, or frequent urination) *BOWEL PROBLEMS (unusual diarrhea, constipation, pain near the anus) TENDERNESS IN MOUTH AND THROAT WITH OR WITHOUT PRESENCE OF ULCERS (sore throat, sores in mouth, or a toothache) UNUSUAL RASH, SWELLING OR PAIN  UNUSUAL VAGINAL DISCHARGE OR ITCHING   Items with * indicate a potential emergency and should be followed up as soon as possible or go to the Emergency Department if any problems should  occur.  Please show the CHEMOTHERAPY ALERT CARD or IMMUNOTHERAPY ALERT CARD at check-in to the Emergency Department and triage nurse.  Should you have questions after your visit or need to cancel or reschedule your appointment, please contact [facility] - [facility] DE[facility]  Dept: [phone]  and follow the prompts.  Office hours are 8:00 a.m. to 4:30 p.m. Monday - Friday. Please note that voicemails left after 4:00 p.m. may not be returned until the following business day.  We are closed weekends and major holidays. You have access to a nurse at all times for urgent questions. Please call the main number to the clinic Dept: [phone] and follow the prompts.   For any non-urgent questions, you may also contact your provider using MyChart. We now offer e-Visits for anyone 56 and older to request care online for non-urgent symptoms. For details visit [email or web address].   Also download the MyChart app! Go to the app store, search "MyChart", open the app, select , and log in with your MyChart username and password.

## 2023-12-13 ENCOUNTER — Other Ambulatory Visit (HOSPITAL_COMMUNITY): Payer: Self-pay

## 2023-12-15 ENCOUNTER — Other Ambulatory Visit (HOSPITAL_COMMUNITY): Payer: Self-pay

## 2023-12-18 ENCOUNTER — Other Ambulatory Visit: Payer: Self-pay | Admitting: Hematology

## 2023-12-18 ENCOUNTER — Other Ambulatory Visit (HOSPITAL_COMMUNITY): Payer: Self-pay

## 2023-12-18 ENCOUNTER — Other Ambulatory Visit: Payer: Self-pay

## 2023-12-18 ENCOUNTER — Encounter: Payer: Self-pay | Admitting: Hematology

## 2023-12-18 MED ORDER — SENNOSIDES-DOCUSATE SODIUM 8.6-50 MG PO TABS
2.0000 | ORAL_TABLET | Freq: Every evening | ORAL | 1 refills | Status: DC | PRN
  Filled 2023-12-18: qty 60, 30d supply, fill #0

## 2023-12-20 ENCOUNTER — Other Ambulatory Visit (HOSPITAL_COMMUNITY): Payer: Self-pay

## 2023-12-21 ENCOUNTER — Other Ambulatory Visit: Payer: Self-pay

## 2023-12-22 ENCOUNTER — Other Ambulatory Visit (HOSPITAL_COMMUNITY): Payer: Self-pay

## 2023-12-22 MED ORDER — OXYCODONE-ACETAMINOPHEN 10-325 MG PO TABS
1.0000 | ORAL_TABLET | Freq: Two times a day (BID) | ORAL | 0 refills | Status: DC
  Filled 2023-12-22 – 2023-12-29 (×3): qty 60, 30d supply, fill #0
  Filled ????-??-??: fill #0

## 2023-12-23 ENCOUNTER — Other Ambulatory Visit (HOSPITAL_COMMUNITY): Payer: Self-pay

## 2023-12-23 ENCOUNTER — Encounter: Payer: Self-pay | Admitting: Hematology

## 2023-12-24 ENCOUNTER — Other Ambulatory Visit (HOSPITAL_COMMUNITY): Payer: Self-pay

## 2023-12-26 ENCOUNTER — Other Ambulatory Visit: Payer: Self-pay | Admitting: *Deleted

## 2023-12-26 ENCOUNTER — Other Ambulatory Visit: Payer: Self-pay

## 2023-12-26 ENCOUNTER — Encounter: Payer: Self-pay | Admitting: Hematology

## 2023-12-26 ENCOUNTER — Inpatient Hospital Stay

## 2023-12-26 ENCOUNTER — Inpatient Hospital Stay: Attending: Oncology

## 2023-12-26 VITALS — BP 156/86 | HR 56 | Temp 98.1°F | Resp 20 | Wt 145.2 lb

## 2023-12-26 DIAGNOSIS — C9001 Multiple myeloma in remission: Secondary | ICD-10-CM | POA: Insufficient documentation

## 2023-12-26 DIAGNOSIS — Z87891 Personal history of nicotine dependence: Secondary | ICD-10-CM | POA: Diagnosis not present

## 2023-12-26 DIAGNOSIS — Z79899 Other long term (current) drug therapy: Secondary | ICD-10-CM | POA: Insufficient documentation

## 2023-12-26 DIAGNOSIS — Z8042 Family history of malignant neoplasm of prostate: Secondary | ICD-10-CM | POA: Diagnosis not present

## 2023-12-26 DIAGNOSIS — Z79620 Long term (current) use of immunosuppressive biologic: Secondary | ICD-10-CM | POA: Insufficient documentation

## 2023-12-26 DIAGNOSIS — Z5112 Encounter for antineoplastic immunotherapy: Secondary | ICD-10-CM | POA: Diagnosis not present

## 2023-12-26 DIAGNOSIS — Z8249 Family history of ischemic heart disease and other diseases of the circulatory system: Secondary | ICD-10-CM | POA: Diagnosis not present

## 2023-12-26 DIAGNOSIS — Z95828 Presence of other vascular implants and grafts: Secondary | ICD-10-CM

## 2023-12-26 LAB — CMP (CANCER CENTER ONLY)
ALT: 15 U/L (ref 0–44)
AST: 30 U/L (ref 15–41)
Albumin: 4 g/dL (ref 3.5–5.0)
Alkaline Phosphatase: 71 U/L (ref 38–126)
Anion gap: 4 — ABNORMAL LOW (ref 5–15)
BUN: 10 mg/dL (ref 8–23)
CO2: 25 mmol/L (ref 22–32)
Calcium: 8.8 mg/dL — ABNORMAL LOW (ref 8.9–10.3)
Chloride: 107 mmol/L (ref 98–111)
Creatinine: 0.81 mg/dL (ref 0.61–1.24)
GFR, Estimated: >60 mL/min (ref >60)
Glucose, Bld: 94 mg/dL (ref 70–99)
Potassium: 4.2 mmol/L (ref 3.5–5.1)
Sodium: 136 mmol/L (ref 135–145)
Total Bilirubin: 0.4 mg/dL (ref 0.0–1.2)
Total Protein: 7.5 g/dL (ref 6.5–8.1)

## 2023-12-26 LAB — CBC WITH DIFFERENTIAL (CANCER CENTER ONLY)
Abs Immature Granulocytes: 0.01 10*3/uL (ref 0.00–0.07)
Basophils Absolute: 0 10*3/uL (ref 0.0–0.1)
Basophils Relative: 0 %
Eosinophils Absolute: 0 10*3/uL (ref 0.0–0.5)
Eosinophils Relative: 1 %
HCT: 31.5 % — ABNORMAL LOW (ref 39.0–52.0)
Hemoglobin: 11 g/dL — ABNORMAL LOW (ref 13.0–17.0)
Immature Granulocytes: 0 %
Lymphocytes Relative: 50 %
Lymphs Abs: 2 10*3/uL (ref 0.7–4.0)
MCH: 33.4 pg (ref 26.0–34.0)
MCHC: 34.9 g/dL (ref 30.0–36.0)
MCV: 95.7 fL (ref 80.0–100.0)
Monocytes Absolute: 0.3 10*3/uL (ref 0.1–1.0)
Monocytes Relative: 6 %
Neutro Abs: 1.7 10*3/uL (ref 1.7–7.7)
Neutrophils Relative %: 43 %
Platelet Count: 203 10*3/uL (ref 150–400)
RBC: 3.29 MIL/uL — ABNORMAL LOW (ref 4.22–5.81)
RDW: 13.4 % (ref 11.5–15.5)
WBC Count: 4 10*3/uL (ref 4.0–10.5)
nRBC: 0 % (ref 0.0–0.2)

## 2023-12-26 MED ORDER — SODIUM CHLORIDE 0.9 % IV SOLN: INTRAVENOUS | Status: AC

## 2023-12-26 MED ORDER — SODIUM CHLORIDE 0.9% FLUSH
10.0000 mL | INTRAVENOUS | Status: DC | PRN
  Administered 2023-12-26: 10 mL

## 2023-12-26 MED ORDER — DARATUMUMAB-HYALURONIDASE-FIHJ 1800-30000 MG-UT/15ML ~~LOC~~ SOLN
1800.0000 mg | Freq: Once | SUBCUTANEOUS | Status: AC
  Administered 2023-12-26: 1800 mg via SUBCUTANEOUS
  Filled 2023-12-26: qty 15

## 2023-12-26 MED ORDER — ACETAMINOPHEN 500 MG PO TABS
1000.0000 mg | ORAL_TABLET | Freq: Once | ORAL | Status: AC
  Administered 2023-12-26: 1000 mg via ORAL
  Filled 2023-12-26: qty 2

## 2023-12-26 MED ORDER — DEXAMETHASONE 4 MG PO TABS
8.0000 mg | ORAL_TABLET | Freq: Once | ORAL | Status: AC
  Administered 2023-12-26: 8 mg via ORAL
  Filled 2023-12-26: qty 2

## 2023-12-26 MED ORDER — DIPHENHYDRAMINE HCL 25 MG PO CAPS
50.0000 mg | ORAL_CAPSULE | Freq: Once | ORAL | Status: AC
  Administered 2023-12-26: 50 mg via ORAL
  Filled 2023-12-26: qty 2

## 2023-12-26 MED ORDER — DEXTROSE 5 % IV SOLN
56.0000 mg/m2 | Freq: Once | INTRAVENOUS | Status: AC
  Administered 2023-12-26: 110 mg via INTRAVENOUS
  Filled 2023-12-26: qty 30

## 2023-12-26 NOTE — Patient Instructions (Signed)
 [facility] - A DEPT OF [facility]  Discharge Instructions: Thank you for choosing [facility] to provide your oncology and hematology care.   If you have a lab appointment with the Cancer Center, please go directly to the Cancer Center and check in at the registration area.   Wear comfortable clothing and clothing appropriate for easy access to any Portacath or PICC line.   We strive to give you quality time with your provider. You may need to reschedule your appointment if you arrive late (15 or more minutes).  Arriving late affects you and other patients whose appointments are after yours.  Also, if you miss three or more appointments without notifying the office, you may be dismissed from the clinic at the provider's discretion.      For prescription refill requests, have your pharmacy contact our office and allow 72 hours for refills to be completed.    Today you received the following chemotherapy and/or immunotherapy agents kyprolis  and darzalex  faspro      To help prevent nausea and vomiting after your treatment, we encourage you to take your nausea medication as directed.  BELOW ARE SYMPTOMS THAT SHOULD BE REPORTED IMMEDIATELY: *FEVER GREATER THAN 100.4 F (38 C) OR HIGHER *CHILLS OR SWEATING *NAUSEA AND VOMITING THAT IS NOT CONTROLLED WITH YOUR NAUSEA MEDICATION *UNUSUAL SHORTNESS OF BREATH *UNUSUAL BRUISING OR BLEEDING *URINARY PROBLEMS (pain or burning when urinating, or frequent urination) *BOWEL PROBLEMS (unusual diarrhea, constipation, pain near the anus) TENDERNESS IN MOUTH AND THROAT WITH OR WITHOUT PRESENCE OF ULCERS (sore throat, sores in mouth, or a toothache) UNUSUAL RASH, SWELLING OR PAIN  UNUSUAL VAGINAL DISCHARGE OR ITCHING   Items with * indicate a potential emergency and should be followed up as soon as possible or go to the Emergency Department if any problems should occur.  Please show the CHEMOTHERAPY ALERT CARD  or IMMUNOTHERAPY ALERT CARD at check-in to the Emergency Department and triage nurse.  Should you have questions after your visit or need to cancel or reschedule your appointment, please contact [facility]  Dept: [phone]  and follow the prompts.  Office hours are 8:00 a.m. to 4:30 p.m. Monday - Friday. Please note that voicemails left after 4:00 p.m. may not be returned until the following business day.  We are closed weekends and major holidays. You have access to a nurse at all times for urgent questions. Please call the main number to the clinic Dept: [phone] and follow the prompts.   For any non-urgent questions, you may also contact your provider using MyChart. We now offer e-Visits for anyone 40 and older to request care online for non-urgent symptoms. For details visit [email or web address].   Also download the MyChart app! Go to the app store, search MyChart, open the app, select [place], and log in with your MyChart username and password.

## 2023-12-27 ENCOUNTER — Other Ambulatory Visit (HOSPITAL_COMMUNITY): Payer: Self-pay

## 2023-12-28 ENCOUNTER — Other Ambulatory Visit: Payer: Self-pay

## 2023-12-29 ENCOUNTER — Other Ambulatory Visit (HOSPITAL_COMMUNITY): Payer: Self-pay

## 2023-12-29 LAB — MULTIPLE MYELOMA PANEL, SERUM
Albumin SerPl Elph-Mcnc: 3.8 g/dL (ref 2.9–4.4)
Albumin/Glob SerPl: 1.1 (ref 0.7–1.7)
Alpha 1: 0.2 g/dL (ref 0.0–0.4)
Alpha2 Glob SerPl Elph-Mcnc: 0.6 g/dL (ref 0.4–1.0)
B-Globulin SerPl Elph-Mcnc: 0.8 g/dL (ref 0.7–1.3)
Gamma Glob SerPl Elph-Mcnc: 2.1 g/dL — ABNORMAL HIGH (ref 0.4–1.8)
Globulin, Total: 3.7 g/dL (ref 2.2–3.9)
IgA: 12 mg/dL — ABNORMAL LOW (ref 61–437)
IgG (Immunoglobin G), Serum: 2049 mg/dL — ABNORMAL HIGH (ref 603–1613)
IgM (Immunoglobulin M), Srm: 11 mg/dL — ABNORMAL LOW (ref 20–172)
M Protein SerPl Elph-Mcnc: 1.7 g/dL — ABNORMAL HIGH
Total Protein ELP: 7.5 g/dL (ref 6.0–8.5)

## 2023-12-31 ENCOUNTER — Other Ambulatory Visit: Payer: Self-pay

## 2024-01-01 ENCOUNTER — Other Ambulatory Visit (HOSPITAL_COMMUNITY): Payer: Self-pay

## 2024-01-02 ENCOUNTER — Other Ambulatory Visit: Payer: Self-pay

## 2024-01-02 ENCOUNTER — Inpatient Hospital Stay

## 2024-01-02 VITALS — BP 166/82 | HR 55 | Temp 98.2°F | Resp 16 | Ht 73.0 in | Wt 143.5 lb

## 2024-01-02 DIAGNOSIS — C9001 Multiple myeloma in remission: Secondary | ICD-10-CM

## 2024-01-02 DIAGNOSIS — Z87891 Personal history of nicotine dependence: Secondary | ICD-10-CM | POA: Diagnosis not present

## 2024-01-02 DIAGNOSIS — Z95828 Presence of other vascular implants and grafts: Secondary | ICD-10-CM

## 2024-01-02 DIAGNOSIS — Z79620 Long term (current) use of immunosuppressive biologic: Secondary | ICD-10-CM | POA: Diagnosis not present

## 2024-01-02 DIAGNOSIS — Z5112 Encounter for antineoplastic immunotherapy: Secondary | ICD-10-CM | POA: Diagnosis not present

## 2024-01-02 DIAGNOSIS — Z79899 Other long term (current) drug therapy: Secondary | ICD-10-CM | POA: Diagnosis not present

## 2024-01-02 DIAGNOSIS — Z8249 Family history of ischemic heart disease and other diseases of the circulatory system: Secondary | ICD-10-CM | POA: Diagnosis not present

## 2024-01-02 LAB — CMP (CANCER CENTER ONLY)
ALT: 15 U/L (ref 0–44)
AST: 16 U/L (ref 15–41)
Albumin: 3.9 g/dL (ref 3.5–5.0)
Alkaline Phosphatase: 69 U/L (ref 38–126)
Anion gap: 3 — ABNORMAL LOW (ref 5–15)
BUN: 16 mg/dL (ref 8–23)
CO2: 29 mmol/L (ref 22–32)
Calcium: 9.2 mg/dL (ref 8.9–10.3)
Chloride: 105 mmol/L (ref 98–111)
Creatinine: 1 mg/dL (ref 0.61–1.24)
GFR, Estimated: >60 mL/min (ref >60)
Glucose, Bld: 104 mg/dL — ABNORMAL HIGH (ref 70–99)
Potassium: 4.3 mmol/L (ref 3.5–5.1)
Sodium: 137 mmol/L (ref 135–145)
Total Bilirubin: 0.4 mg/dL (ref 0.0–1.2)
Total Protein: 7.2 g/dL (ref 6.5–8.1)

## 2024-01-02 LAB — CBC WITH DIFFERENTIAL (CANCER CENTER ONLY)
Abs Immature Granulocytes: 0.02 10*3/uL (ref 0.00–0.07)
Basophils Absolute: 0 10*3/uL (ref 0.0–0.1)
Basophils Relative: 0 %
Eosinophils Absolute: 0 10*3/uL (ref 0.0–0.5)
Eosinophils Relative: 0 %
HCT: 30.8 % — ABNORMAL LOW (ref 39.0–52.0)
Hemoglobin: 10.7 g/dL — ABNORMAL LOW (ref 13.0–17.0)
Immature Granulocytes: 0 %
Lymphocytes Relative: 39 %
Lymphs Abs: 1.8 10*3/uL (ref 0.7–4.0)
MCH: 33.6 pg (ref 26.0–34.0)
MCHC: 34.7 g/dL (ref 30.0–36.0)
MCV: 96.9 fL (ref 80.0–100.0)
Monocytes Absolute: 0.4 10*3/uL (ref 0.1–1.0)
Monocytes Relative: 9 %
Neutro Abs: 2.3 10*3/uL (ref 1.7–7.7)
Neutrophils Relative %: 52 %
Platelet Count: 96 10*3/uL — ABNORMAL LOW (ref 150–400)
RBC: 3.18 MIL/uL — ABNORMAL LOW (ref 4.22–5.81)
RDW: 13.3 % (ref 11.5–15.5)
WBC Count: 4.6 10*3/uL (ref 4.0–10.5)
nRBC: 0 % (ref 0.0–0.2)

## 2024-01-02 MED ORDER — SODIUM CHLORIDE 0.9 % IV SOLN: INTRAVENOUS | Status: AC

## 2024-01-02 MED ORDER — HEPARIN SOD (PORK) LOCK FLUSH 100 UNIT/ML IV SOLN
500.0000 [IU] | Freq: Once | INTRAVENOUS | Status: AC
  Administered 2024-01-02: 500 [IU] via INTRAVENOUS

## 2024-01-02 MED ORDER — SODIUM CHLORIDE 0.9 % IV SOLN: INTRAVENOUS | Status: DC

## 2024-01-02 MED ORDER — ACETAMINOPHEN 500 MG PO TABS
1000.0000 mg | ORAL_TABLET | Freq: Once | ORAL | Status: AC
  Administered 2024-01-02: 1000 mg via ORAL
  Filled 2024-01-02: qty 2

## 2024-01-02 MED ORDER — DEXAMETHASONE 4 MG PO TABS
8.0000 mg | ORAL_TABLET | Freq: Once | ORAL | Status: AC
  Administered 2024-01-02: 8 mg via ORAL
  Filled 2024-01-02: qty 2

## 2024-01-02 MED ORDER — DEXTROSE 5 % IV SOLN
56.0000 mg/m2 | Freq: Once | INTRAVENOUS | Status: AC
  Administered 2024-01-02: 110 mg via INTRAVENOUS
  Filled 2024-01-02: qty 30

## 2024-01-02 MED ORDER — SODIUM CHLORIDE 0.9% FLUSH
10.0000 mL | Freq: Once | INTRAVENOUS | Status: AC
  Administered 2024-01-02: 10 mL via INTRAVENOUS

## 2024-01-02 MED ORDER — SODIUM CHLORIDE 0.9% FLUSH
10.0000 mL | INTRAVENOUS | Status: DC | PRN
  Administered 2024-01-02: 10 mL

## 2024-01-02 MED ORDER — DIPHENHYDRAMINE HCL 25 MG PO CAPS
50.0000 mg | ORAL_CAPSULE | Freq: Once | ORAL | Status: AC
  Administered 2024-01-02: 50 mg via ORAL
  Filled 2024-01-02: qty 2

## 2024-01-02 NOTE — Progress Notes (Signed)
 Pt. blood pressure elevated 187/110, recheck 184/91. Pt. denies chest pain, dizziness, no SOB noted. Pt. states he took his Norvasc  this am along with a pain pill. Dr. Salomon Cree notified and no new orders received. Pt. tolerated treatment without difficulty and blood pressure at the end of treatment 166/82. Left via ambulation and no respiratory distress noted.

## 2024-01-02 NOTE — Progress Notes (Signed)
 Per Dr Salomon Cree ok to tx with Plts 96

## 2024-01-02 NOTE — Patient Instructions (Signed)
 [facility] - A DEPT OF [facility]  Discharge Instructions: Thank you for choosing [facility] to provide your oncology and hematology care.   If you have a lab appointment with the Cancer Center, please go directly to the Cancer Center and check in at the registration area.   Wear comfortable clothing and clothing appropriate for easy access to any Portacath or PICC line.   We strive to give you quality time with your provider. You may need to reschedule your appointment if you arrive late (15 or more minutes).  Arriving late affects you and other patients whose appointments are after yours.  Also, if you miss three or more appointments without notifying the office, you may be dismissed from the clinic at the provider's discretion.      For prescription refill requests, have your pharmacy contact our office and allow 72 hours for refills to be completed.    Today you received the following chemotherapy and/or immunotherapy agent: Carfilzomib (Kyprolis)      To help prevent nausea and vomiting after your treatment, we encourage you to take your nausea medication as directed.  BELOW ARE SYMPTOMS THAT SHOULD BE REPORTED IMMEDIATELY: *FEVER GREATER THAN 100.4 F (38 C) OR HIGHER *CHILLS OR SWEATING *NAUSEA AND VOMITING THAT IS NOT CONTROLLED WITH YOUR NAUSEA MEDICATION *UNUSUAL SHORTNESS OF BREATH *UNUSUAL BRUISING OR BLEEDING *URINARY PROBLEMS (pain or burning when urinating, or frequent urination) *BOWEL PROBLEMS (unusual diarrhea, constipation, pain near the anus) TENDERNESS IN MOUTH AND THROAT WITH OR WITHOUT PRESENCE OF ULCERS (sore throat, sores in mouth, or a toothache) UNUSUAL RASH, SWELLING OR PAIN  UNUSUAL VAGINAL DISCHARGE OR ITCHING   Items with * indicate a potential emergency and should be followed up as soon as possible or go to the Emergency Department if any problems should occur.  Please show the CHEMOTHERAPY ALERT CARD or  IMMUNOTHERAPY ALERT CARD at check-in to the Emergency Department and triage nurse.  Should you have questions after your visit or need to cancel or reschedule your appointment, please contact [facility] - A DEPT OF [facility]  Dept: [phone]  and follow the prompts.  Office hours are 8:00 a.m. to 4:30 p.m. Monday - Friday. Please note that voicemails left after 4:00 p.m. may not be returned until the following business day.  We are closed weekends and major holidays. You have access to a nurse at all times for urgent questions. Please call the main number to the clinic Dept: [phone] and follow the prompts.   For any non-urgent questions, you may also contact your provider using MyChart. We now offer e-Visits for anyone 22 and older to request care online for non-urgent symptoms. For details visit [email or web address].   Also download the MyChart app! Go to the app store, search "MyChart", open the app, select [place], and log in with your MyChart username and password.  Carfilzomib Injection What is this medication? CARFILZOMIB (kar FILZ oh mib) treats multiple myeloma, a type of bone marrow cancer. It works by blocking a protein that causes cancer cells to grow and multiply. This helps to slow or stop the spread of cancer cells. This medicine may be used for other purposes; ask your health care provider or pharmacist if you have questions. COMMON BRAND NAME(S): KYPROLIS What should I tell my care team before I take this medication? They need to know if you have any of these conditions:  Heart disease History of blood clots Irregular heartbeat Kidney disease Liver disease Lung or breathing disease An unusual or allergic reaction to carfilzomib, or other medications, foods, dyes, or preservatives If you or your partner are pregnant or trying to get pregnant Breastfeeding How should I use this medication? This medication is injected into a  vein. It is given by your care team in a hospital or clinic setting. Talk to your care team about the use of this medication in children. Special care may be needed. Overdosage: If you think you have taken too much of this medicine contact a poison control center or emergency room at once. NOTE: This medicine is only for you. Do not share this medicine with others. What if I miss a dose? Keep appointments for follow-up doses. It is important not to miss your dose. Call your care team if you are unable to keep an appointment. What may interact with this medication? Interactions are not expected. This list may not describe all possible interactions. Give your health care provider a list of all the medicines, herbs, non-prescription drugs, or dietary supplements you use. Also tell them if you smoke, drink alcohol, or use illegal drugs. Some items may interact with your medicine. What should I watch for while using this medication? Your condition will be monitored carefully while you are receiving this medication. You may need blood work while taking this medication. Check with your care team if you have severe diarrhea, nausea, and vomiting, or if you sweat a lot. The loss of too much body fluid may make it dangerous for you to take this medication. This medication may affect your coordination, reaction time, or judgment. Do not drive or operate machinery until you know how this medication affects you. Sit up or stand slowly to reduce the risk of dizzy or fainting spells. Drinking alcohol with this medication can increase the risk of these side effects. Talk to your care team if you may be pregnant. Serious birth defects can occur if you take this medication during pregnancy and for 6 months after the last dose. You will need a negative pregnancy test before starting this medication. Contraception is recommended while taking this medication and for 6 months after the last dose. Your care team can help you  find an option that works for you. If your partner can get pregnant, use a condom during sex while taking this medication and for 3 months after the last dose. Do not breastfeed while taking this medication and for 2 weeks after the last dose. This medication may cause infertility. Talk to your care team if you are concerned about your fertility. What side effects may I notice from receiving this medication? Side effects that you should report to your care team as soon as possible: Allergic reactions--skin rash, itching, hives, swelling of the face, lips, tongue, or throat Bleeding--bloody or black, tar-like stools, vomiting blood or brown material that looks like coffee grounds, red or dark brown urine, small red or purple spots on skin, unusual bruising or bleeding Blood clot--pain, swelling, or warmth in the leg, shortness of breath, chest pain Dizziness, loss of balance or coordination, confusion or trouble speaking Heart attack--pain or tightness in the chest, shoulders, arms, or jaw, nausea, shortness of breath, cold or clammy skin, feeling faint or lightheaded Heart failure--shortness of breath, swelling of the ankles, feet, or hands, sudden weight gain, unusual weakness or fatigue Heart rhythm changes--fast or irregular heartbeat, dizziness, feeling faint or lightheaded, chest pain, trouble breathing  Increase in blood pressure Infection--fever, chills, cough, sore throat, wounds that don't heal, pain or trouble when passing urine, general feeling of discomfort or being unwell Infusion reactions--chest pain, shortness of breath or trouble breathing, feeling faint or lightheaded Kidney injury--decrease in the amount of urine, swelling of the ankles, hands, or feet Liver injury--right upper belly pain, loss of appetite, nausea, light-colored stool, dark yellow or brown urine, yellowing skin or eyes, unusual weakness or fatigue Lung injury--shortness of breath or trouble breathing, cough,  spitting up blood, chest pain, fever Pulmonary hypertension--shortness of breath, chest pain, fast or irregular heartbeat, feeling faint or lightheaded, fatigue, swelling of the ankles or feet Stomach pain, bloody diarrhea, pale skin, unusual weakness or fatigue, decrease in the amount of urine, which may be signs of hemolytic uremic syndrome Sudden and severe headache, confusion, change in vision, seizures, which may be signs of posterior reversible encephalopathy syndrome (PRES) TTP--purple spots on the skin or inside the mouth, pale skin, yellowing skin or eyes, unusual weakness or fatigue, fever, fast or irregular heartbeat, confusion, change in vision, trouble speaking, trouble walking Tumor lysis syndrome (TLS)--nausea, vomiting, diarrhea, decrease in the amount of urine, dark urine, unusual weakness or fatigue, confusion, muscle pain or cramps, fast or irregular heartbeat, joint pain Side effects that usually do not require medical attention (report to your care team if they continue or are bothersome): Diarrhea Fatigue Nausea Trouble sleeping This list may not describe all possible side effects. Call your doctor for medical advice about side effects. You may report side effects to FDA at 1-800-FDA-1088. Where should I keep my medication? This medication is given in a hospital or clinic. It will not be stored at home. NOTE: This sheet is a summary. It may not cover all possible information. If you have questions about this medicine, talk to your doctor, pharmacist, or health care provider.  2024 Elsevier/Gold Standard (2021-11-29 00:00:00)

## 2024-01-02 NOTE — Progress Notes (Signed)
 Pt ok for tx today with BP 170/87, per Dr Salomon Cree

## 2024-01-05 ENCOUNTER — Emergency Department (HOSPITAL_COMMUNITY)
Admission: EM | Admit: 2024-01-05 | Discharge: 2024-01-05 | Disposition: A | Discharge status: Home / Self Care | Attending: Emergency Medicine | Admitting: Emergency Medicine

## 2024-01-05 ENCOUNTER — Other Ambulatory Visit (HOSPITAL_COMMUNITY): Payer: Self-pay

## 2024-01-05 ENCOUNTER — Emergency Department (HOSPITAL_COMMUNITY)

## 2024-01-05 ENCOUNTER — Other Ambulatory Visit: Payer: Self-pay

## 2024-01-05 ENCOUNTER — Encounter (HOSPITAL_COMMUNITY): Payer: Self-pay

## 2024-01-05 ENCOUNTER — Encounter: Payer: Self-pay | Admitting: Hematology

## 2024-01-05 DIAGNOSIS — M545 Low back pain, unspecified: Secondary | ICD-10-CM | POA: Diagnosis not present

## 2024-01-05 DIAGNOSIS — Z8546 Personal history of malignant neoplasm of prostate: Secondary | ICD-10-CM | POA: Diagnosis not present

## 2024-01-05 DIAGNOSIS — M5459 Other low back pain: Secondary | ICD-10-CM | POA: Diagnosis not present

## 2024-01-05 DIAGNOSIS — R509 Fever, unspecified: Secondary | ICD-10-CM | POA: Diagnosis not present

## 2024-01-05 DIAGNOSIS — Z79899 Other long term (current) drug therapy: Secondary | ICD-10-CM | POA: Diagnosis not present

## 2024-01-05 DIAGNOSIS — R051 Acute cough: Secondary | ICD-10-CM | POA: Insufficient documentation

## 2024-01-05 DIAGNOSIS — R0981 Nasal congestion: Secondary | ICD-10-CM | POA: Diagnosis not present

## 2024-01-05 DIAGNOSIS — G8929 Other chronic pain: Secondary | ICD-10-CM | POA: Diagnosis not present

## 2024-01-05 DIAGNOSIS — R059 Cough, unspecified: Secondary | ICD-10-CM | POA: Diagnosis not present

## 2024-01-05 LAB — RESP PANEL BY RT-PCR (RSV, FLU A&B, COVID)  RVPGX2
Influenza A by PCR: NEGATIVE
Influenza B by PCR: NEGATIVE
Resp Syncytial Virus by PCR: NEGATIVE
SARS Coronavirus 2 by RT PCR: NEGATIVE

## 2024-01-05 MED ORDER — BENZONATATE 100 MG PO CAPS
100.0000 mg | ORAL_CAPSULE | Freq: Three times a day (TID) | ORAL | 0 refills | Status: DC
  Filled 2024-01-05: qty 15, 5d supply, fill #0

## 2024-01-05 MED ORDER — KETOROLAC TROMETHAMINE 15 MG/ML IJ SOLN
15.0000 mg | Freq: Once | INTRAMUSCULAR | Status: AC
  Administered 2024-01-05: 15 mg via INTRAMUSCULAR
  Filled 2024-01-05: qty 1

## 2024-01-05 NOTE — ED Provider Notes (Signed)
 [place] EMERGENCY DEPARTMENT AT [facility] Provider Note   CSN: [id number] Arrival date & time: 01/05/24  9286     Patient presents with: Chills and Cough   Travis Nguyen is a 73 y.o. male.   Patient with history of MGUS, prostate cancer, chronic back pain, chronic narcotic use, h/o DVT use --presents to the emergency department today for evaluation of chills and cough.  Patient states that symptoms have been ongoing for about 1 week.  He also reports acute on chronic low back pain.  He is able to ambulate at baseline.  No reported urinary retention/incontinence, fecal incontinence.  States that home pain medication has been helping less.  States he has follow-up with patient.  No documented fevers.  No ear pain, runny nose, sore throat.       Prior to Admission medications   Medication Sig Start Date End Date Taking? Authorizing Provider  benzonatate  (TESSALON ) 100 MG capsule Take 1 capsule (100 mg total) by mouth every 8 (eight) hours. 01/05/24  Yes Symphony Demuro, PA-C  acyclovir  (ZOVIRAX ) 400 MG tablet Take 1 tablet (400 mg total) by mouth 2 (two) times daily. 11/28/23   Onesimo Emaline Brink, MD  amLODipine  (NORVASC ) 5 MG tablet Take 1 tablet (5 mg total) by mouth daily. 08/19/23     Cyanocobalamin (VITAMIN B12 PO) Take by mouth daily.    [provider]  diazepam  (VALIUM ) 5 MG tablet Take 1 tablet (5 mg total) by mouth every 12 (twelve) hours as needed for muscle spasms. 03/31/23   Pamella Ozell LABOR, DO  docusate sodium  (COLACE) 100 MG capsule Take 1 capsule (100 mg total) by mouth every 12 (twelve) hours. 11/12/23   Deni Berti, PA-C  lidocaine -prilocaine  (EMLA ) cream APPLY TOPICALLY TO PORT-A-CATH DAILY AS NEEDED 06/26/23 06/25/24  Onesimo Emaline Brink, MD  losartan  (COZAAR ) 100 MG tablet Take 100 mg by mouth daily.    [provider]  oxyCODONE -acetaminophen  (PERCOCET ) 10-325 MG tablet Take 1 tablet by mouth 2 times a day as needed orally (ok to  fill 30 days after last refill) 02/25/22     oxyCODONE -acetaminophen  (PERCOCET ) 10-325 MG tablet Take 1 tablet by mouth 2 (two) times daily. 12/22/23     senna-docusate (SENNA S) 8.6-50 MG tablet Take 2 tablets by mouth at bedtime as needed for mild constipation. 12/18/23   Onesimo Emaline Brink, MD  vitamin C (ASCORBIC ACID) 500 MG tablet Take 500 mg by mouth daily.    [provider]    Allergies: Aspirin    Review of Systems  Updated Vital Signs BP (!) 176/89 (BP Location: Right Arm)   Pulse (!) 59   Temp 98 F (36.7 C) (Oral)   Resp 16   SpO2 97%   Physical Exam Vitals and nursing note reviewed.  Constitutional:      Appearance: He is well-developed.  HENT:     Head: Normocephalic and atraumatic.     Jaw: No trismus.     Right Ear: External ear normal.     Left Ear: External ear normal.     Nose: Nose normal. No mucosal edema or rhinorrhea.     Mouth/Throat:     Mouth: Mucous membranes are not dry.     Pharynx: Uvula midline. No oropharyngeal exudate, posterior oropharyngeal erythema or uvula swelling.     Tonsils: No tonsillar abscesses.   Eyes:     General:        Right eye: No discharge.  Left eye: No discharge.     Conjunctiva/sclera: Conjunctivae normal.    Cardiovascular:     Rate and Rhythm: Normal rate and regular rhythm.     Heart sounds: Normal heart sounds.  Pulmonary:     Effort: Pulmonary effort is normal. No respiratory distress.     Breath sounds: Normal breath sounds. No wheezing or rales.  Abdominal:     Palpations: Abdomen is soft.     Tenderness: There is no abdominal tenderness.   Musculoskeletal:     Cervical back: Normal range of motion and neck supple. No tenderness or bony tenderness. Normal range of motion.     Thoracic back: No tenderness.     Lumbar back: Tenderness present. No bony tenderness.       Back:     Comments: Healed surgical scar noted lower lumbar spine   Skin:    General: Skin is warm and dry.    Neurological:     Mental Status: He is alert.    ED Course  Patient seen and examined. History obtained directly from patient.   Labs/EKG: Ordered viral panel  Imaging: Ordered CXR  Medications/Fluids: Ordered: IM toradol   Most recent vital signs reviewed and are as follows: BP (!) 176/89 (BP Location: Right Arm)   Pulse (!) 59   Temp 98 F (36.7 C) (Oral)   Resp 16   SpO2 97%   Initial impression: cough, acute on chronic low back pain  9:31 AM Reassessment performed. Patient appears stable, comfortable.  Ambulatory in the room.  About to receive IM Toradol .  Patient discussed with and seen by Dr. Doretha.   Labs personally reviewed and interpreted including: Negative flu, COVID, RSV testing  Imaging personally visualized and interpreted including: Chest x-ray agree negative.  Reviewed pertinent lab work and imaging with patient at bedside. Questions answered.   Most current vital signs reviewed and are as follows: BP (!) 176/89 (BP Location: Right Arm)   Pulse (!) 59   Temp 98 F (36.7 C) (Oral)   Resp 16   SpO2 97%   Plan: Discharge to home.   Prescriptions written for: Tessalon   Other home care instructions discussed: Monitoring of symptoms, OTC meds as needed, continue home pain medication regimen.  ED return instructions discussed: Return with new or worsening symptoms  Follow-up instructions discussed: Patient encouraged to follow-up with their PCP in 5 days if not improving.    (all labs ordered are listed, but only abnormal results are displayed) Labs Reviewed  RESP PANEL BY RT-PCR (RSV, FLU A&B, COVID)  [id number]    EKG: None  Radiology: DG Chest 2 View Result Date: 01/05/2024 CLINICAL DATA:  Cough for 1 week. EXAM: CHEST - 2 VIEW COMPARISON:  05/25/2023 FINDINGS: The heart size and mediastinal contours are within normal limits. Right-sided power port remains in appropriate position. Both lungs are clear. The visualized skeletal structures are  unremarkable. IMPRESSION: No active cardiopulmonary disease. Electronically Signed   By: Norleen DELENA Kil M.D.   On: 01/05/2024 08:15     Procedures   Medications Ordered in the ED  ketorolac  (TORADOL ) 15 MG/ML injection 15 mg (15 mg Intramuscular Given 01/05/24 9073)                                    Medical Decision Making Amount and/or Complexity of Data Reviewed Radiology: ordered.  Risk Prescription drug management.   Cough/chills: Chest x-ray is  clear without signs of pneumonia, patient with no signs of fluid overload.  No associated chest pain or other URI symptoms.  No tachycardia or hypotension.  Do not suspect sepsis.  Viral panel was negative.  Will treat symptomatically.  Acute on chronic low back pain: No red flags, appears to be chronic.  Patient ambulatory.  No urinary or fecal symptoms.  Toradol  here, otherwise continue home pain regimen.  This is secondary complaint.     Final diagnoses:  Acute cough  Chronic low back pain, unspecified back pain laterality, unspecified whether sciatica present    ED Discharge Orders          Ordered    benzonatate  (TESSALON ) 100 MG capsule  Every 8 hours        01/05/24 0922               Desiderio Chew, PA-C 01/05/24 9066    Doretha Folks, MD 01/05/24 [phone]

## 2024-01-05 NOTE — Discharge Instructions (Signed)
 Please read and follow all provided instructions.  Your diagnoses today include:  1. Acute cough   2. Chronic low back pain, unspecified back pain laterality, unspecified whether sciatica present     Tests performed today include: Flu, COVID, RSV testing: Was negative Chest x-ray: Was negative and did not show signs of pneumonia Vital signs. See below for your results today.   Medications prescribed:  Tessalon  Perles - cough suppressant medication  Take any prescribed medications only as directed.  Home care instructions:  Follow any educational materials contained in this packet.  BE VERY CAREFUL not to take multiple medicines containing Tylenol  (also called acetaminophen ). Doing so can lead to an overdose which can damage your liver and cause liver failure and possibly death.   Follow-up instructions: Please follow-up with your primary care provider in the next 5 days for further evaluation of your symptoms if not improving.   Return instructions:  Please return to the Emergency Department if you experience worsening symptoms.  Please return if you have any other emergent concerns.  Additional Information:  Your vital signs today were: BP (!) 176/89 (BP Location: Right Arm)   Pulse (!) 59   Temp 98 F (36.7 C) (Oral)   Resp 16   SpO2 97%  If your blood pressure (BP) was elevated above 135/85 this visit, please have this repeated by your doctor within one month. --------------

## 2024-01-05 NOTE — ED Triage Notes (Signed)
 Pt c.o chills and cough x 1 week

## 2024-01-06 LAB — MULTIPLE MYELOMA PANEL, SERUM
Albumin SerPl Elph-Mcnc: 3.6 g/dL (ref 2.9–4.4)
Albumin/Glob SerPl: 1.1 (ref 0.7–1.7)
Alpha 1: 0.2 g/dL (ref 0.0–0.4)
Alpha2 Glob SerPl Elph-Mcnc: 0.8 g/dL (ref 0.4–1.0)
B-Globulin SerPl Elph-Mcnc: 0.8 g/dL (ref 0.7–1.3)
Gamma Glob SerPl Elph-Mcnc: 1.7 g/dL (ref 0.4–1.8)
Globulin, Total: 3.5 g/dL (ref 2.2–3.9)
IgA: 10 mg/dL — ABNORMAL LOW (ref 61–437)
IgG (Immunoglobin G), Serum: 1947 mg/dL — ABNORMAL HIGH (ref 603–1613)
IgM (Immunoglobulin M), Srm: 9 mg/dL — ABNORMAL LOW (ref 20–172)
M Protein SerPl Elph-Mcnc: 1.4 g/dL — ABNORMAL HIGH
Total Protein ELP: 7.1 g/dL (ref 6.0–8.5)
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
## 2024-01-09 ENCOUNTER — Other Ambulatory Visit: Payer: Self-pay | Admitting: Hematology

## 2024-01-09 ENCOUNTER — Other Ambulatory Visit: Payer: Self-pay

## 2024-01-09 ENCOUNTER — Other Ambulatory Visit (HOSPITAL_COMMUNITY): Payer: Self-pay

## 2024-01-09 ENCOUNTER — Inpatient Hospital Stay

## 2024-01-09 ENCOUNTER — Other Ambulatory Visit: Payer: Self-pay | Admitting: *Deleted

## 2024-01-09 ENCOUNTER — Inpatient Hospital Stay (HOSPITAL_BASED_OUTPATIENT_CLINIC_OR_DEPARTMENT_OTHER): Admitting: Hematology

## 2024-01-09 VITALS — BP 162/81 | HR 56 | Temp 98.1°F | Resp 18 | Wt 142.3 lb

## 2024-01-09 DIAGNOSIS — C9001 Multiple myeloma in remission: Secondary | ICD-10-CM

## 2024-01-09 DIAGNOSIS — Z95828 Presence of other vascular implants and grafts: Secondary | ICD-10-CM

## 2024-01-09 DIAGNOSIS — Z5112 Encounter for antineoplastic immunotherapy: Secondary | ICD-10-CM | POA: Diagnosis not present

## 2024-01-09 DIAGNOSIS — Z8249 Family history of ischemic heart disease and other diseases of the circulatory system: Secondary | ICD-10-CM | POA: Diagnosis not present

## 2024-01-09 DIAGNOSIS — Z79899 Other long term (current) drug therapy: Secondary | ICD-10-CM | POA: Diagnosis not present

## 2024-01-09 DIAGNOSIS — Z87891 Personal history of nicotine dependence: Secondary | ICD-10-CM | POA: Diagnosis not present

## 2024-01-09 DIAGNOSIS — Z79620 Long term (current) use of immunosuppressive biologic: Secondary | ICD-10-CM | POA: Diagnosis not present

## 2024-01-09 LAB — CBC WITH DIFFERENTIAL (CANCER CENTER ONLY)
Abs Immature Granulocytes: 0.01 10*3/uL (ref 0.00–0.07)
Basophils Absolute: 0 10*3/uL (ref 0.0–0.1)
Basophils Relative: 0 %
Eosinophils Absolute: 0 10*3/uL (ref 0.0–0.5)
Eosinophils Relative: 1 %
HCT: 30.3 % — ABNORMAL LOW (ref 39.0–52.0)
Hemoglobin: 10.4 g/dL — ABNORMAL LOW (ref 13.0–17.0)
Immature Granulocytes: 0 %
Lymphocytes Relative: 27 %
Lymphs Abs: 1.2 10*3/uL (ref 0.7–4.0)
MCH: 32.9 pg (ref 26.0–34.0)
MCHC: 34.3 g/dL (ref 30.0–36.0)
MCV: 95.9 fL (ref 80.0–100.0)
Monocytes Absolute: 0.5 10*3/uL (ref 0.1–1.0)
Monocytes Relative: 13 %
Neutro Abs: 2.5 10*3/uL (ref 1.7–7.7)
Neutrophils Relative %: 59 %
Platelet Count: 182 10*3/uL (ref 150–400)
RBC: 3.16 MIL/uL — ABNORMAL LOW (ref 4.22–5.81)
RDW: 13.1 % (ref 11.5–15.5)
WBC Count: 4.2 10*3/uL (ref 4.0–10.5)
nRBC: 0 % (ref 0.0–0.2)

## 2024-01-09 LAB — CMP (CANCER CENTER ONLY)
ALT: 11 U/L (ref 0–44)
AST: 14 U/L — ABNORMAL LOW (ref 15–41)
Albumin: 4.1 g/dL (ref 3.5–5.0)
Alkaline Phosphatase: 70 U/L (ref 38–126)
Anion gap: 5 (ref 5–15)
BUN: 15 mg/dL (ref 8–23)
CO2: 26 mmol/L (ref 22–32)
Calcium: 9.6 mg/dL (ref 8.9–10.3)
Chloride: 103 mmol/L (ref 98–111)
Creatinine: 0.89 mg/dL (ref 0.61–1.24)
GFR, Estimated: >60 mL/min (ref >60)
Glucose, Bld: 96 mg/dL (ref 70–99)
Potassium: 4.4 mmol/L (ref 3.5–5.1)
Sodium: 134 mmol/L — ABNORMAL LOW (ref 135–145)
Total Bilirubin: 0.4 mg/dL (ref 0.0–1.2)
Total Protein: 7.7 g/dL (ref 6.5–8.1)

## 2024-01-09 MED ORDER — DEXTROSE 5 % IV SOLN
56.0000 mg/m2 | Freq: Once | INTRAVENOUS | Status: AC
  Administered 2024-01-09: 110 mg via INTRAVENOUS
  Filled 2024-01-09: qty 30

## 2024-01-09 MED ORDER — SODIUM CHLORIDE 0.9% FLUSH
10.0000 mL | INTRAVENOUS | Status: DC | PRN
  Administered 2024-01-09: 10 mL

## 2024-01-09 MED ORDER — SODIUM CHLORIDE 0.9 % IV SOLN: Freq: Once | INTRAVENOUS | Status: AC

## 2024-01-09 MED ORDER — DEXAMETHASONE 4 MG PO TABS
8.0000 mg | ORAL_TABLET | Freq: Once | ORAL | Status: AC
  Administered 2024-01-09: 8 mg via ORAL
  Filled 2024-01-09: qty 2

## 2024-01-09 MED ORDER — SODIUM CHLORIDE 0.9 % IV SOLN: INTRAVENOUS | Status: AC

## 2024-01-09 MED ORDER — DARATUMUMAB-HYALURONIDASE-FIHJ 1800-30000 MG-UT/15ML ~~LOC~~ SOLN
1800.0000 mg | Freq: Once | SUBCUTANEOUS | Status: AC
  Administered 2024-01-09: 1800 mg via SUBCUTANEOUS
  Filled 2024-01-09: qty 15

## 2024-01-09 MED ORDER — DIPHENHYDRAMINE HCL 25 MG PO CAPS
50.0000 mg | ORAL_CAPSULE | Freq: Once | ORAL | Status: AC
  Administered 2024-01-09: 50 mg via ORAL
  Filled 2024-01-09: qty 2

## 2024-01-09 MED ORDER — ACETAMINOPHEN 500 MG PO TABS
1000.0000 mg | ORAL_TABLET | Freq: Once | ORAL | Status: AC
  Administered 2024-01-09: 1000 mg via ORAL
  Filled 2024-01-09: qty 2

## 2024-01-09 NOTE — Patient Instructions (Signed)
 [facility] - A DEPT OF [facility]  Discharge Instructions: Thank you for choosing [facility] to provide your oncology and hematology care.   If you have a lab appointment with the Cancer Center, please go directly to the Cancer Center and check in at the registration area.   Wear comfortable clothing and clothing appropriate for easy access to any Portacath or PICC line.   We strive to give you quality time with your provider. You may need to reschedule your appointment if you arrive late (15 or more minutes).  Arriving late affects you and other patients whose appointments are after yours.  Also, if you miss three or more appointments without notifying the office, you may be dismissed from the clinic at the provider's discretion.      For prescription refill requests, have your pharmacy contact our office and allow 72 hours for refills to be completed.    Today you received the following chemotherapy and/or immunotherapy agents: Kyprolis/Darzalex  Faspro      To help prevent nausea and vomiting after your treatment, we encourage you to take your nausea medication as directed.  BELOW ARE SYMPTOMS THAT SHOULD BE REPORTED IMMEDIATELY: *FEVER GREATER THAN 100.4 F (38 C) OR HIGHER *CHILLS OR SWEATING *NAUSEA AND VOMITING THAT IS NOT CONTROLLED WITH YOUR NAUSEA MEDICATION *UNUSUAL SHORTNESS OF BREATH *UNUSUAL BRUISING OR BLEEDING *URINARY PROBLEMS (pain or burning when urinating, or frequent urination) *BOWEL PROBLEMS (unusual diarrhea, constipation, pain near the anus) TENDERNESS IN MOUTH AND THROAT WITH OR WITHOUT PRESENCE OF ULCERS (sore throat, sores in mouth, or a toothache) UNUSUAL RASH, SWELLING OR PAIN  UNUSUAL VAGINAL DISCHARGE OR ITCHING   Items with * indicate a potential emergency and should be followed up as soon as possible or go to the Emergency Department if any problems should occur.  Please show the CHEMOTHERAPY ALERT CARD or  IMMUNOTHERAPY ALERT CARD at check-in to the Emergency Department and triage nurse.  Should you have questions after your visit or need to cancel or reschedule your appointment, please contact [facility] - A DEPT OF [facility]  Dept: [phone]  and follow the prompts.  Office hours are 8:00 a.m. to 4:30 p.m. Monday - Friday. Please note that voicemails left after 4:00 p.m. may not be returned until the following business day.  We are closed weekends and major holidays. You have access to a nurse at all times for urgent questions. Please call the main number to the clinic Dept: [phone] and follow the prompts.   For any non-urgent questions, you may also contact your provider using MyChart. We now offer e-Visits for anyone 57 and older to request care online for non-urgent symptoms. For details visit [email or web address].   Also download the MyChart app! Go to the app store, search MyChart, open the app, select [place], and log in with your MyChart username and password.

## 2024-01-13 ENCOUNTER — Other Ambulatory Visit (HOSPITAL_COMMUNITY): Payer: Self-pay

## 2024-01-13 ENCOUNTER — Encounter: Payer: Self-pay | Admitting: Hematology

## 2024-01-13 ENCOUNTER — Other Ambulatory Visit: Payer: Self-pay

## 2024-01-14 LAB — MULTIPLE MYELOMA PANEL, SERUM
Albumin SerPl Elph-Mcnc: 4 g/dL (ref 2.9–4.4)
Albumin/Glob SerPl: 1.3 (ref 0.7–1.7)
Alpha 1: 0.2 g/dL (ref 0.0–0.4)
Alpha2 Glob SerPl Elph-Mcnc: 0.8 g/dL (ref 0.4–1.0)
B-Globulin SerPl Elph-Mcnc: 0.7 g/dL (ref 0.7–1.3)
Gamma Glob SerPl Elph-Mcnc: 1.6 g/dL (ref 0.4–1.8)
Globulin, Total: 3.3 g/dL (ref 2.2–3.9)
IgA: 13 mg/dL — ABNORMAL LOW (ref 61–437)
IgG (Immunoglobin G), Serum: 1819 mg/dL — ABNORMAL HIGH (ref 603–1613)
IgM (Immunoglobulin M), Srm: 8 mg/dL — ABNORMAL LOW (ref 20–172)
M Protein SerPl Elph-Mcnc: 1.3 g/dL — ABNORMAL HIGH
Total Protein ELP: 7.3 g/dL (ref 6.0–8.5)

## 2024-01-15 ENCOUNTER — Other Ambulatory Visit: Payer: Self-pay

## 2024-01-16 ENCOUNTER — Other Ambulatory Visit: Payer: Self-pay

## 2024-01-23 ENCOUNTER — Inpatient Hospital Stay: Attending: Oncology

## 2024-01-23 ENCOUNTER — Encounter: Payer: Self-pay | Admitting: Hematology

## 2024-01-23 ENCOUNTER — Inpatient Hospital Stay

## 2024-01-23 VITALS — BP 172/89 | HR 56 | Temp 98.3°F | Resp 18 | Wt 142.8 lb

## 2024-01-23 DIAGNOSIS — Z9079 Acquired absence of other genital organ(s): Secondary | ICD-10-CM | POA: Diagnosis not present

## 2024-01-23 DIAGNOSIS — Z8616 Personal history of COVID-19: Secondary | ICD-10-CM | POA: Insufficient documentation

## 2024-01-23 DIAGNOSIS — Z79620 Long term (current) use of immunosuppressive biologic: Secondary | ICD-10-CM | POA: Insufficient documentation

## 2024-01-23 DIAGNOSIS — Z886 Allergy status to analgesic agent status: Secondary | ICD-10-CM | POA: Insufficient documentation

## 2024-01-23 DIAGNOSIS — Z87891 Personal history of nicotine dependence: Secondary | ICD-10-CM | POA: Diagnosis not present

## 2024-01-23 DIAGNOSIS — Z8546 Personal history of malignant neoplasm of prostate: Secondary | ICD-10-CM | POA: Insufficient documentation

## 2024-01-23 DIAGNOSIS — Z95828 Presence of other vascular implants and grafts: Secondary | ICD-10-CM

## 2024-01-23 DIAGNOSIS — I10 Essential (primary) hypertension: Secondary | ICD-10-CM | POA: Insufficient documentation

## 2024-01-23 DIAGNOSIS — Z8249 Family history of ischemic heart disease and other diseases of the circulatory system: Secondary | ICD-10-CM | POA: Diagnosis not present

## 2024-01-23 DIAGNOSIS — Z79899 Other long term (current) drug therapy: Secondary | ICD-10-CM | POA: Insufficient documentation

## 2024-01-23 DIAGNOSIS — Z5112 Encounter for antineoplastic immunotherapy: Secondary | ICD-10-CM | POA: Insufficient documentation

## 2024-01-23 DIAGNOSIS — Z8042 Family history of malignant neoplasm of prostate: Secondary | ICD-10-CM | POA: Insufficient documentation

## 2024-01-23 DIAGNOSIS — Z85820 Personal history of malignant melanoma of skin: Secondary | ICD-10-CM | POA: Insufficient documentation

## 2024-01-23 DIAGNOSIS — C9001 Multiple myeloma in remission: Secondary | ICD-10-CM

## 2024-01-23 DIAGNOSIS — D649 Anemia, unspecified: Secondary | ICD-10-CM | POA: Insufficient documentation

## 2024-01-23 DIAGNOSIS — Z7969 Long term (current) use of other immunomodulators and immunosuppressants: Secondary | ICD-10-CM | POA: Insufficient documentation

## 2024-01-23 DIAGNOSIS — Z9049 Acquired absence of other specified parts of digestive tract: Secondary | ICD-10-CM | POA: Insufficient documentation

## 2024-01-23 LAB — CBC WITH DIFFERENTIAL (CANCER CENTER ONLY)
Abs Immature Granulocytes: 0.01 K/uL (ref 0.00–0.07)
Basophils Absolute: 0 K/uL (ref 0.0–0.1)
Basophils Relative: 1 %
Eosinophils Absolute: 0 K/uL (ref 0.0–0.5)
Eosinophils Relative: 1 %
HCT: 28.8 % — ABNORMAL LOW (ref 39.0–52.0)
Hemoglobin: 10 g/dL — ABNORMAL LOW (ref 13.0–17.0)
Immature Granulocytes: 0 %
Lymphocytes Relative: 36 %
Lymphs Abs: 1.5 K/uL (ref 0.7–4.0)
MCH: 34.1 pg — ABNORMAL HIGH (ref 26.0–34.0)
MCHC: 34.7 g/dL (ref 30.0–36.0)
MCV: 98.3 fL (ref 80.0–100.0)
Monocytes Absolute: 0.3 K/uL (ref 0.1–1.0)
Monocytes Relative: 6 %
Neutro Abs: 2.3 K/uL (ref 1.7–7.7)
Neutrophils Relative %: 56 %
Platelet Count: 195 K/uL (ref 150–400)
RBC: 2.93 MIL/uL — ABNORMAL LOW (ref 4.22–5.81)
RDW: 13.8 % (ref 11.5–15.5)
WBC Count: 4.1 K/uL (ref 4.0–10.5)
nRBC: 0 % (ref 0.0–0.2)

## 2024-01-23 LAB — COMPREHENSIVE METABOLIC PANEL WITH GFR
ALT: 13 U/L (ref 0–44)
AST: 16 U/L (ref 15–41)
Albumin: 4 g/dL (ref 3.5–5.0)
Alkaline Phosphatase: 78 U/L (ref 38–126)
Anion gap: 3 — ABNORMAL LOW (ref 5–15)
BUN: 13 mg/dL (ref 8–23)
CO2: 27 mmol/L (ref 22–32)
Calcium: 8.7 mg/dL — ABNORMAL LOW (ref 8.9–10.3)
Chloride: 104 mmol/L (ref 98–111)
Creatinine, Ser: 0.88 mg/dL (ref 0.61–1.24)
GFR, Estimated: >60 mL/min (ref >60)
Glucose, Bld: 106 mg/dL — ABNORMAL HIGH (ref 70–99)
Potassium: 4.5 mmol/L (ref 3.5–5.1)
Sodium: 134 mmol/L — ABNORMAL LOW (ref 135–145)
Total Bilirubin: 0.4 mg/dL (ref 0.0–1.2)
Total Protein: 7.4 g/dL (ref 6.5–8.1)

## 2024-01-23 MED ORDER — DEXTROSE 5 % IV SOLN
56.0000 mg/m2 | Freq: Once | INTRAVENOUS | Status: AC
  Administered 2024-01-23: 110 mg via INTRAVENOUS
  Filled 2024-01-23: qty 30

## 2024-01-23 MED ORDER — DEXAMETHASONE 4 MG PO TABS
8.0000 mg | ORAL_TABLET | Freq: Once | ORAL | Status: AC
Start: 2024-01-23 — End: 2024-01-23
  Administered 2024-01-23: 8 mg via ORAL
  Filled 2024-01-23: qty 2

## 2024-01-23 MED ORDER — SODIUM CHLORIDE 0.9% FLUSH
10.0000 mL | INTRAVENOUS | Status: DC | PRN
  Administered 2024-01-23: 10 mL

## 2024-01-23 MED ORDER — ACETAMINOPHEN 500 MG PO TABS
1000.0000 mg | ORAL_TABLET | Freq: Once | ORAL | Status: AC
  Administered 2024-01-23: 1000 mg via ORAL
  Filled 2024-01-23: qty 2

## 2024-01-23 MED ORDER — DARATUMUMAB-HYALURONIDASE-FIHJ 1800-30000 MG-UT/15ML ~~LOC~~ SOLN
1800.0000 mg | Freq: Once | SUBCUTANEOUS | Status: AC
  Administered 2024-01-23: 1800 mg via SUBCUTANEOUS
  Filled 2024-01-23: qty 15

## 2024-01-23 MED ORDER — SODIUM CHLORIDE 0.9 % IV SOLN: INTRAVENOUS | Status: DC

## 2024-01-23 MED ORDER — SODIUM CHLORIDE 0.9 % IV SOLN: INTRAVENOUS | Status: AC

## 2024-01-23 MED ORDER — DIPHENHYDRAMINE HCL 25 MG PO CAPS
50.0000 mg | ORAL_CAPSULE | Freq: Once | ORAL | Status: AC
  Administered 2024-01-23: 50 mg via ORAL
  Filled 2024-01-23: qty 2

## 2024-01-23 MED ORDER — HEPARIN SOD (PORK) LOCK FLUSH 100 UNIT/ML IV SOLN
500.0000 [IU] | Freq: Once | INTRAVENOUS | Status: AC | PRN
  Administered 2024-01-23: 500 [IU]

## 2024-01-23 NOTE — Patient Instructions (Signed)
 [facility] - A DEPT OF [facility]  Discharge Instructions: Thank you for choosing [facility] to provide your oncology and hematology care.   If you have a lab appointment with the Cancer Center, please go directly to the Cancer Center and check in at the registration area.   Wear comfortable clothing and clothing appropriate for easy access to any Portacath or PICC line.   We strive to give you quality time with your provider. You may need to reschedule your appointment if you arrive late (15 or more minutes).  Arriving late affects you and other patients whose appointments are after yours.  Also, if you miss three or more appointments without notifying the office, you may be dismissed from the clinic at the provider's discretion.      For prescription refill requests, have your pharmacy contact our office and allow 72 hours for refills to be completed.    Today you received the following chemotherapy and/or immunotherapy agents: Kyprolis , Darzalex  Faspro      To help prevent nausea and vomiting after your treatment, we encourage you to take your nausea medication as directed.  BELOW ARE SYMPTOMS THAT SHOULD BE REPORTED IMMEDIATELY: *FEVER GREATER THAN 100.4 F (38 C) OR HIGHER *CHILLS OR SWEATING *NAUSEA AND VOMITING THAT IS NOT CONTROLLED WITH YOUR NAUSEA MEDICATION *UNUSUAL SHORTNESS OF BREATH *UNUSUAL BRUISING OR BLEEDING *URINARY PROBLEMS (pain or burning when urinating, or frequent urination) *BOWEL PROBLEMS (unusual diarrhea, constipation, pain near the anus) TENDERNESS IN MOUTH AND THROAT WITH OR WITHOUT PRESENCE OF ULCERS (sore throat, sores in mouth, or a toothache) UNUSUAL RASH, SWELLING OR PAIN  UNUSUAL VAGINAL DISCHARGE OR ITCHING   Items with * indicate a potential emergency and should be followed up as soon as possible or go to the Emergency Department if any problems should occur.  Please show the CHEMOTHERAPY ALERT CARD  or IMMUNOTHERAPY ALERT CARD at check-in to the Emergency Department and triage nurse.  Should you have questions after your visit or need to cancel or reschedule your appointment, please contact [facility] - A DEPT OF [facility]  Dept: [phone]  and follow the prompts.  Office hours are 8:00 a.m. to 4:30 p.m. Monday - Friday. Please note that voicemails left after 4:00 p.m. may not be returned until the following business day.  We are closed weekends and major holidays. You have access to a nurse at all times for urgent questions. Please call the main number to the clinic Dept: [phone] and follow the prompts.   For any non-urgent questions, you may also contact your provider using MyChart. We now offer e-Visits for anyone 92 and older to request care online for non-urgent symptoms. For details visit [email or web address].   Also download the MyChart app! Go to the app store, search "MyChart", open the app, select [place], and log in with your MyChart username and password.

## 2024-01-26 ENCOUNTER — Other Ambulatory Visit (HOSPITAL_COMMUNITY): Payer: Self-pay

## 2024-01-26 ENCOUNTER — Encounter: Payer: Self-pay | Admitting: Hematology

## 2024-01-26 MED ORDER — OXYCODONE-ACETAMINOPHEN 10-325 MG PO TABS
1.0000 | ORAL_TABLET | Freq: Two times a day (BID) | ORAL | 0 refills | Status: DC | PRN
  Filled 2024-01-28: qty 60, 30d supply, fill #0

## 2024-01-26 NOTE — Progress Notes (Signed)
 This encounter was created in error - please disregard.

## 2024-01-27 DIAGNOSIS — M7918 Myalgia, other site: Secondary | ICD-10-CM | POA: Diagnosis not present

## 2024-01-27 DIAGNOSIS — M47816 Spondylosis without myelopathy or radiculopathy, lumbar region: Secondary | ICD-10-CM | POA: Diagnosis not present

## 2024-01-27 DIAGNOSIS — G8929 Other chronic pain: Secondary | ICD-10-CM | POA: Diagnosis not present

## 2024-01-28 ENCOUNTER — Other Ambulatory Visit (HOSPITAL_COMMUNITY): Payer: Self-pay

## 2024-01-29 LAB — MULTIPLE MYELOMA PANEL, SERUM
Albumin SerPl Elph-Mcnc: 4.2 g/dL (ref 2.9–4.4)
Albumin/Glob SerPl: 1.5 (ref 0.7–1.7)
Alpha 1: 0.2 g/dL (ref 0.0–0.4)
Alpha2 Glob SerPl Elph-Mcnc: 0.6 g/dL (ref 0.4–1.0)
B-Globulin SerPl Elph-Mcnc: 0.7 g/dL (ref 0.7–1.3)
Gamma Glob SerPl Elph-Mcnc: 1.5 g/dL (ref 0.4–1.8)
Globulin, Total: 3 g/dL (ref 2.2–3.9)
IgA: 11 mg/dL — ABNORMAL LOW (ref 61–437)
IgG (Immunoglobin G), Serum: 1770 mg/dL — ABNORMAL HIGH (ref 603–1613)
IgM (Immunoglobulin M), Srm: 8 mg/dL — ABNORMAL LOW (ref 20–172)
M Protein SerPl Elph-Mcnc: 1.3 g/dL — ABNORMAL HIGH
Total Protein ELP: 7.2 g/dL (ref 6.0–8.5)

## 2024-01-30 ENCOUNTER — Emergency Department (HOSPITAL_COMMUNITY)
Admission: EM | Admit: 2024-01-30 | Discharge: 2024-01-30 | Disposition: A | Discharge status: Home / Self Care | Attending: Emergency Medicine | Admitting: Emergency Medicine

## 2024-01-30 ENCOUNTER — Encounter (HOSPITAL_COMMUNITY): Payer: Self-pay

## 2024-01-30 ENCOUNTER — Other Ambulatory Visit (HOSPITAL_COMMUNITY): Payer: Self-pay

## 2024-01-30 ENCOUNTER — Other Ambulatory Visit: Payer: Self-pay

## 2024-01-30 ENCOUNTER — Inpatient Hospital Stay

## 2024-01-30 VITALS — BP 178/86 | HR 62 | Temp 98.6°F | Resp 18 | Wt 142.8 lb

## 2024-01-30 DIAGNOSIS — Z85820 Personal history of malignant melanoma of skin: Secondary | ICD-10-CM | POA: Diagnosis not present

## 2024-01-30 DIAGNOSIS — Z8546 Personal history of malignant neoplasm of prostate: Secondary | ICD-10-CM | POA: Diagnosis not present

## 2024-01-30 DIAGNOSIS — I10 Essential (primary) hypertension: Secondary | ICD-10-CM | POA: Insufficient documentation

## 2024-01-30 DIAGNOSIS — D696 Thrombocytopenia, unspecified: Secondary | ICD-10-CM | POA: Diagnosis not present

## 2024-01-30 DIAGNOSIS — Z7969 Long term (current) use of other immunomodulators and immunosuppressants: Secondary | ICD-10-CM | POA: Diagnosis not present

## 2024-01-30 DIAGNOSIS — Z8579 Personal history of other malignant neoplasms of lymphoid, hematopoietic and related tissues: Secondary | ICD-10-CM | POA: Diagnosis not present

## 2024-01-30 DIAGNOSIS — C9001 Multiple myeloma in remission: Secondary | ICD-10-CM | POA: Diagnosis not present

## 2024-01-30 DIAGNOSIS — Z5112 Encounter for antineoplastic immunotherapy: Secondary | ICD-10-CM | POA: Diagnosis not present

## 2024-01-30 DIAGNOSIS — D649 Anemia, unspecified: Secondary | ICD-10-CM | POA: Diagnosis not present

## 2024-01-30 DIAGNOSIS — Z79899 Other long term (current) drug therapy: Secondary | ICD-10-CM | POA: Diagnosis not present

## 2024-01-30 DIAGNOSIS — Z87891 Personal history of nicotine dependence: Secondary | ICD-10-CM | POA: Diagnosis not present

## 2024-01-30 DIAGNOSIS — Z79620 Long term (current) use of immunosuppressive biologic: Secondary | ICD-10-CM | POA: Diagnosis not present

## 2024-01-30 DIAGNOSIS — R42 Dizziness and giddiness: Secondary | ICD-10-CM | POA: Diagnosis present

## 2024-01-30 DIAGNOSIS — Z9049 Acquired absence of other specified parts of digestive tract: Secondary | ICD-10-CM | POA: Diagnosis not present

## 2024-01-30 DIAGNOSIS — Z95828 Presence of other vascular implants and grafts: Secondary | ICD-10-CM

## 2024-01-30 DIAGNOSIS — Z8249 Family history of ischemic heart disease and other diseases of the circulatory system: Secondary | ICD-10-CM | POA: Diagnosis not present

## 2024-01-30 DIAGNOSIS — Z886 Allergy status to analgesic agent status: Secondary | ICD-10-CM | POA: Diagnosis not present

## 2024-01-30 LAB — CBC WITH DIFFERENTIAL (CANCER CENTER ONLY)
Abs Immature Granulocytes: 0.01 K/uL (ref 0.00–0.07)
Basophils Absolute: 0 K/uL (ref 0.0–0.1)
Basophils Relative: 0 %
Eosinophils Absolute: 0 K/uL (ref 0.0–0.5)
Eosinophils Relative: 0 %
HCT: 28.3 % — ABNORMAL LOW (ref 39.0–52.0)
Hemoglobin: 9.9 g/dL — ABNORMAL LOW (ref 13.0–17.0)
Immature Granulocytes: 0 %
Lymphocytes Relative: 33 %
Lymphs Abs: 1.6 K/uL (ref 0.7–4.0)
MCH: 34 pg (ref 26.0–34.0)
MCHC: 35 g/dL (ref 30.0–36.0)
MCV: 97.3 fL (ref 80.0–100.0)
Monocytes Absolute: 0.5 K/uL (ref 0.1–1.0)
Monocytes Relative: 9 %
Neutro Abs: 2.9 K/uL (ref 1.7–7.7)
Neutrophils Relative %: 58 %
Platelet Count: 110 K/uL — ABNORMAL LOW (ref 150–400)
RBC: 2.91 MIL/uL — ABNORMAL LOW (ref 4.22–5.81)
RDW: 13.7 % (ref 11.5–15.5)
WBC Count: 5 K/uL (ref 4.0–10.5)
nRBC: 0 % (ref 0.0–0.2)

## 2024-01-30 LAB — COMPREHENSIVE METABOLIC PANEL WITH GFR
ALT: 81 U/L — ABNORMAL HIGH (ref 0–44)
AST: 137 U/L — ABNORMAL HIGH (ref 15–41)
Albumin: 4 g/dL (ref 3.5–5.0)
Alkaline Phosphatase: 117 U/L (ref 38–126)
Anion gap: 5 (ref 5–15)
BUN: 13 mg/dL (ref 8–23)
CO2: 28 mmol/L (ref 22–32)
Calcium: 9.2 mg/dL (ref 8.9–10.3)
Chloride: 103 mmol/L (ref 98–111)
Creatinine, Ser: 0.91 mg/dL (ref 0.61–1.24)
GFR, Estimated: >60 mL/min (ref >60)
Glucose, Bld: 98 mg/dL (ref 70–99)
Potassium: 4.3 mmol/L (ref 3.5–5.1)
Sodium: 136 mmol/L (ref 135–145)
Total Bilirubin: 0.6 mg/dL (ref 0.0–1.2)
Total Protein: 7.4 g/dL (ref 6.5–8.1)

## 2024-01-30 MED ORDER — SODIUM CHLORIDE 0.9% FLUSH
10.0000 mL | INTRAVENOUS | Status: DC | PRN
  Administered 2024-01-30: 10 mL

## 2024-01-30 MED ORDER — ACETAMINOPHEN 500 MG PO TABS
1000.0000 mg | ORAL_TABLET | Freq: Once | ORAL | Status: AC
Start: 2024-01-30 — End: 2024-01-30
  Administered 2024-01-30: 1000 mg via ORAL
  Filled 2024-01-30: qty 2

## 2024-01-30 MED ORDER — DIPHENHYDRAMINE HCL 25 MG PO CAPS
50.0000 mg | ORAL_CAPSULE | Freq: Once | ORAL | Status: AC
  Administered 2024-01-30: 50 mg via ORAL
  Filled 2024-01-30: qty 2

## 2024-01-30 MED ORDER — SODIUM CHLORIDE 0.9 % IV SOLN: INTRAVENOUS | Status: AC

## 2024-01-30 MED ORDER — DEXAMETHASONE 4 MG PO TABS
8.0000 mg | ORAL_TABLET | Freq: Once | ORAL | Status: AC
Start: 2024-01-30 — End: 2024-01-30
  Administered 2024-01-30: 8 mg via ORAL
  Filled 2024-01-30: qty 2

## 2024-01-30 MED ORDER — DEXTROSE 5 % IV SOLN
56.0000 mg/m2 | Freq: Once | INTRAVENOUS | Status: AC
  Administered 2024-01-30: 110 mg via INTRAVENOUS
  Filled 2024-01-30: qty 30

## 2024-01-30 NOTE — ED Triage Notes (Signed)
 Pt arrived reporting HBP. Takes BP meds daily and complaint. Endorses headache, dizziness. No other symptoms.

## 2024-01-30 NOTE — Discharge Instructions (Signed)
 You were evaluated in the emergency room for elevated blood pressure.  At time of discharge her blood pressure had improved.  If you experience any new or worsening symptoms including chest pain, shortness of breath, unilateral weakness or numbness, difficulty speaking or walking please return to the emergency room.  Otherwise please follow-up with your primary care doctor to review your blood pressure medications.
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
## 2024-01-30 NOTE — ED Notes (Signed)
Patient refuses blood draw.

## 2024-01-30 NOTE — Patient Instructions (Signed)

## 2024-01-30 NOTE — ED Notes (Signed)
 Patient refuses continuation of care. Unable to obtain final vitals.

## 2024-01-31 ENCOUNTER — Other Ambulatory Visit: Payer: Self-pay

## 2024-02-02 ENCOUNTER — Other Ambulatory Visit: Payer: Self-pay

## 2024-02-02 ENCOUNTER — Other Ambulatory Visit (HOSPITAL_COMMUNITY): Payer: Self-pay

## 2024-02-02 MED ORDER — LOSARTAN POTASSIUM 100 MG PO TABS
100.0000 mg | ORAL_TABLET | Freq: Every day | ORAL | 0 refills | Status: DC
  Filled 2024-02-02: qty 90, 90d supply, fill #0

## 2024-02-06 ENCOUNTER — Inpatient Hospital Stay: Admitting: Physician Assistant

## 2024-02-06 ENCOUNTER — Inpatient Hospital Stay

## 2024-02-06 VITALS — BP 167/77 | HR 68 | Temp 98.1°F | Resp 17 | Ht 73.0 in | Wt 142.0 lb

## 2024-02-06 DIAGNOSIS — Z79620 Long term (current) use of immunosuppressive biologic: Secondary | ICD-10-CM | POA: Diagnosis not present

## 2024-02-06 DIAGNOSIS — Z95828 Presence of other vascular implants and grafts: Secondary | ICD-10-CM

## 2024-02-06 DIAGNOSIS — Z8249 Family history of ischemic heart disease and other diseases of the circulatory system: Secondary | ICD-10-CM | POA: Diagnosis not present

## 2024-02-06 DIAGNOSIS — C9001 Multiple myeloma in remission: Secondary | ICD-10-CM

## 2024-02-06 DIAGNOSIS — I10 Essential (primary) hypertension: Secondary | ICD-10-CM | POA: Diagnosis not present

## 2024-02-06 DIAGNOSIS — Z9049 Acquired absence of other specified parts of digestive tract: Secondary | ICD-10-CM | POA: Diagnosis not present

## 2024-02-06 DIAGNOSIS — Z5112 Encounter for antineoplastic immunotherapy: Secondary | ICD-10-CM

## 2024-02-06 DIAGNOSIS — Z79899 Other long term (current) drug therapy: Secondary | ICD-10-CM | POA: Diagnosis not present

## 2024-02-06 DIAGNOSIS — D649 Anemia, unspecified: Secondary | ICD-10-CM | POA: Diagnosis not present

## 2024-02-06 DIAGNOSIS — Z85820 Personal history of malignant melanoma of skin: Secondary | ICD-10-CM | POA: Diagnosis not present

## 2024-02-06 DIAGNOSIS — C9002 Multiple myeloma in relapse: Secondary | ICD-10-CM | POA: Diagnosis not present

## 2024-02-06 DIAGNOSIS — Z7969 Long term (current) use of other immunomodulators and immunosuppressants: Secondary | ICD-10-CM | POA: Diagnosis not present

## 2024-02-06 DIAGNOSIS — Z886 Allergy status to analgesic agent status: Secondary | ICD-10-CM | POA: Diagnosis not present

## 2024-02-06 DIAGNOSIS — Z87891 Personal history of nicotine dependence: Secondary | ICD-10-CM | POA: Diagnosis not present

## 2024-02-06 LAB — COMPREHENSIVE METABOLIC PANEL WITH GFR
ALT: 19 U/L (ref 0–44)
AST: 16 U/L (ref 15–41)
Albumin: 4.1 g/dL (ref 3.5–5.0)
Alkaline Phosphatase: 89 U/L (ref 38–126)
Anion gap: 4 — ABNORMAL LOW (ref 5–15)
BUN: 14 mg/dL (ref 8–23)
CO2: 26 mmol/L (ref 22–32)
Calcium: 8.7 mg/dL — ABNORMAL LOW (ref 8.9–10.3)
Chloride: 105 mmol/L (ref 98–111)
Creatinine, Ser: 0.88 mg/dL (ref 0.61–1.24)
GFR, Estimated: >60 mL/min (ref >60)
Glucose, Bld: 109 mg/dL — ABNORMAL HIGH (ref 70–99)
Potassium: 4 mmol/L (ref 3.5–5.1)
Sodium: 135 mmol/L (ref 135–145)
Total Bilirubin: 0.4 mg/dL (ref 0.0–1.2)
Total Protein: 7.6 g/dL (ref 6.5–8.1)

## 2024-02-06 LAB — CBC WITH DIFFERENTIAL (CANCER CENTER ONLY)
Abs Immature Granulocytes: 0.01 K/uL (ref 0.00–0.07)
Basophils Absolute: 0 K/uL (ref 0.0–0.1)
Basophils Relative: 0 %
Eosinophils Absolute: 0 K/uL (ref 0.0–0.5)
Eosinophils Relative: 0 %
HCT: 27.8 % — ABNORMAL LOW (ref 39.0–52.0)
Hemoglobin: 9.6 g/dL — ABNORMAL LOW (ref 13.0–17.0)
Immature Granulocytes: 0 %
Lymphocytes Relative: 37 %
Lymphs Abs: 1.3 K/uL (ref 0.7–4.0)
MCH: 33.9 pg (ref 26.0–34.0)
MCHC: 34.5 g/dL (ref 30.0–36.0)
MCV: 98.2 fL (ref 80.0–100.0)
Monocytes Absolute: 0.4 K/uL (ref 0.1–1.0)
Monocytes Relative: 11 %
Neutro Abs: 1.9 K/uL (ref 1.7–7.7)
Neutrophils Relative %: 52 %
Platelet Count: 171 K/uL (ref 150–400)
RBC: 2.83 MIL/uL — ABNORMAL LOW (ref 4.22–5.81)
RDW: 13.6 % (ref 11.5–15.5)
WBC Count: 3.7 K/uL — ABNORMAL LOW (ref 4.0–10.5)
nRBC: 0 % (ref 0.0–0.2)

## 2024-02-06 MED ORDER — DEXAMETHASONE 4 MG PO TABS
8.0000 mg | ORAL_TABLET | Freq: Once | ORAL | Status: AC
Start: 2024-02-06 — End: 2024-02-06
  Administered 2024-02-06: 8 mg via ORAL
  Filled 2024-02-06: qty 2

## 2024-02-06 MED ORDER — SODIUM CHLORIDE 0.9 % IV SOLN: INTRAVENOUS | Status: AC

## 2024-02-06 MED ORDER — ACETAMINOPHEN 500 MG PO TABS
1000.0000 mg | ORAL_TABLET | Freq: Once | ORAL | Status: AC
  Administered 2024-02-06: 1000 mg via ORAL
  Filled 2024-02-06: qty 2

## 2024-02-06 MED ORDER — SODIUM CHLORIDE 0.9% FLUSH
10.0000 mL | INTRAVENOUS | Status: DC | PRN
  Administered 2024-02-06: 10 mL

## 2024-02-06 MED ORDER — DARATUMUMAB-HYALURONIDASE-FIHJ 1800-30000 MG-UT/15ML ~~LOC~~ SOLN
1800.0000 mg | Freq: Once | SUBCUTANEOUS | Status: AC
  Administered 2024-02-06: 1800 mg via SUBCUTANEOUS
  Filled 2024-02-06: qty 15

## 2024-02-06 MED ORDER — DEXTROSE 5 % IV SOLN
56.0000 mg/m2 | Freq: Once | INTRAVENOUS | Status: AC
  Administered 2024-02-06: 110 mg via INTRAVENOUS
  Filled 2024-02-06: qty 30

## 2024-02-06 MED ORDER — SODIUM CHLORIDE 0.9% FLUSH
10.0000 mL | INTRAVENOUS | Status: DC | PRN
Start: 2024-02-06 — End: 2024-02-06
  Administered 2024-02-06: 10 mL

## 2024-02-06 MED ORDER — HEPARIN SOD (PORK) LOCK FLUSH 100 UNIT/ML IV SOLN
500.0000 [IU] | Freq: Once | INTRAVENOUS | Status: AC | PRN
  Administered 2024-02-06: 500 [IU]

## 2024-02-06 MED ORDER — DIPHENHYDRAMINE HCL 25 MG PO CAPS
50.0000 mg | ORAL_CAPSULE | Freq: Once | ORAL | Status: AC
  Administered 2024-02-06: 50 mg via ORAL
  Filled 2024-02-06: qty 2
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
## 2024-02-06 NOTE — Patient Instructions (Signed)
 [facility] - A DEPT OF [facility]  Discharge Instructions: Thank you for choosing [facility] to provide your oncology and hematology care.   If you have a lab appointment with the Cancer Center, please go directly to the Cancer Center and check in at the registration area.   Wear comfortable clothing and clothing appropriate for easy access to any Portacath or PICC line.   We strive to give you quality time with your provider. You may need to reschedule your appointment if you arrive late (15 or more minutes).  Arriving late affects you and other patients whose appointments are after yours.  Also, if you miss three or more appointments without notifying the office, you may be dismissed from the clinic at the provider's discretion.      For prescription refill requests, have your pharmacy contact our office and allow 72 hours for refills to be completed.    Today you received the following chemotherapy and/or immunotherapy agents: Kyprolis  and Darzalex  Fapsro      To help prevent nausea and vomiting after your treatment, we encourage you to take your nausea medication as directed.  BELOW ARE SYMPTOMS THAT SHOULD BE REPORTED IMMEDIATELY: *FEVER GREATER THAN 100.4 F (38 C) OR HIGHER *CHILLS OR SWEATING *NAUSEA AND VOMITING THAT IS NOT CONTROLLED WITH YOUR NAUSEA MEDICATION *UNUSUAL SHORTNESS OF BREATH *UNUSUAL BRUISING OR BLEEDING *URINARY PROBLEMS (pain or burning when urinating, or frequent urination) *BOWEL PROBLEMS (unusual diarrhea, constipation, pain near the anus) TENDERNESS IN MOUTH AND THROAT WITH OR WITHOUT PRESENCE OF ULCERS (sore throat, sores in mouth, or a toothache) UNUSUAL RASH, SWELLING OR PAIN  UNUSUAL VAGINAL DISCHARGE OR ITCHING   Items with * indicate a potential emergency and should be followed up as soon as possible or go to the Emergency Department if any problems should occur.  Please show the CHEMOTHERAPY ALERT  CARD or IMMUNOTHERAPY ALERT CARD at check-in to the Emergency Department and triage nurse.  Should you have questions after your visit or need to cancel or reschedule your appointment, please contact [facility] - A DEPT OF [facility]  Dept: [phone]  and follow the prompts.  Office hours are 8:00 a.m. to 4:30 p.m. Monday - Friday. Please note that voicemails left after 4:00 p.m. may not be returned until the following business day.  We are closed weekends and major holidays. You have access to a nurse at all times for urgent questions. Please call the main number to the clinic Dept: [phone] and follow the prompts.   For any non-urgent questions, you may also contact your provider using MyChart. We now offer e-Visits for anyone 95 and older to request care online for non-urgent symptoms. For details visit [email or web address].   Also download the MyChart app! Go to the app store, search MyChart, open the app, select [place], and log in with your MyChart username and password.

## 2024-02-09 ENCOUNTER — Other Ambulatory Visit (HOSPITAL_COMMUNITY): Payer: Self-pay

## 2024-02-19 ENCOUNTER — Inpatient Hospital Stay

## 2024-02-19 ENCOUNTER — Encounter: Payer: Self-pay | Admitting: Hematology

## 2024-02-19 ENCOUNTER — Inpatient Hospital Stay: Attending: Oncology

## 2024-02-19 VITALS — BP 186/88 | HR 61 | Resp 18 | Wt 141.2 lb

## 2024-02-19 DIAGNOSIS — Z79899 Other long term (current) drug therapy: Secondary | ICD-10-CM | POA: Insufficient documentation

## 2024-02-19 DIAGNOSIS — C9001 Multiple myeloma in remission: Secondary | ICD-10-CM | POA: Diagnosis not present

## 2024-02-19 DIAGNOSIS — Z5112 Encounter for antineoplastic immunotherapy: Secondary | ICD-10-CM | POA: Insufficient documentation

## 2024-02-19 DIAGNOSIS — Z95828 Presence of other vascular implants and grafts: Secondary | ICD-10-CM

## 2024-02-19 DIAGNOSIS — Z79620 Long term (current) use of immunosuppressive biologic: Secondary | ICD-10-CM | POA: Diagnosis not present

## 2024-02-19 LAB — COMPREHENSIVE METABOLIC PANEL WITH GFR
ALT: 36 U/L (ref 0–44)
AST: 30 U/L (ref 15–41)
Albumin: 4.5 g/dL (ref 3.5–5.0)
Alkaline Phosphatase: 82 U/L (ref 38–126)
Anion gap: 4 — ABNORMAL LOW (ref 5–15)
BUN: 12 mg/dL (ref 8–23)
CO2: 28 mmol/L (ref 22–32)
Calcium: 9.3 mg/dL (ref 8.9–10.3)
Chloride: 104 mmol/L (ref 98–111)
Creatinine, Ser: 0.77 mg/dL (ref 0.61–1.24)
GFR, Estimated: >60 mL/min
Glucose, Bld: 93 mg/dL (ref 70–99)
Potassium: 4 mmol/L (ref 3.5–5.1)
Sodium: 136 mmol/L (ref 135–145)
Total Bilirubin: 0.6 mg/dL (ref 0.0–1.2)
Total Protein: 7.9 g/dL (ref 6.5–8.1)

## 2024-02-19 LAB — CBC WITH DIFFERENTIAL (CANCER CENTER ONLY)
Abs Immature Granulocytes: 0.01 10*3/uL (ref 0.00–0.07)
Basophils Absolute: 0 10*3/uL (ref 0.0–0.1)
Basophils Relative: 1 %
Eosinophils Absolute: 0 10*3/uL (ref 0.0–0.5)
Eosinophils Relative: 0 %
HCT: 28.4 % — ABNORMAL LOW (ref 39.0–52.0)
Hemoglobin: 9.8 g/dL — ABNORMAL LOW (ref 13.0–17.0)
Immature Granulocytes: 0 %
Lymphocytes Relative: 37 %
Lymphs Abs: 1.6 10*3/uL (ref 0.7–4.0)
MCH: 35.1 pg — ABNORMAL HIGH (ref 26.0–34.0)
MCHC: 34.5 g/dL (ref 30.0–36.0)
MCV: 101.8 fL — ABNORMAL HIGH (ref 80.0–100.0)
Monocytes Absolute: 0.3 10*3/uL (ref 0.1–1.0)
Monocytes Relative: 6 %
Neutro Abs: 2.4 10*3/uL (ref 1.7–7.7)
Neutrophils Relative %: 56 %
Platelet Count: 247 10*3/uL (ref 150–400)
RBC: 2.79 MIL/uL — ABNORMAL LOW (ref 4.22–5.81)
RDW: 14.2 % (ref 11.5–15.5)
WBC Count: 4.4 10*3/uL (ref 4.0–10.5)
nRBC: 0 % (ref 0.0–0.2)

## 2024-02-19 MED ORDER — DEXAMETHASONE 4 MG PO TABS
8.0000 mg | ORAL_TABLET | Freq: Once | ORAL | Status: AC
  Administered 2024-02-19: 8 mg via ORAL

## 2024-02-19 MED ORDER — DARATUMUMAB-HYALURONIDASE-FIHJ 1800-30000 MG-UT/15ML ~~LOC~~ SOLN
1800.0000 mg | Freq: Once | SUBCUTANEOUS | Status: AC
  Administered 2024-02-19: 1800 mg via SUBCUTANEOUS
  Filled 2024-02-19: qty 15

## 2024-02-19 MED ORDER — DEXTROSE 5 % IV SOLN
56.0000 mg/m2 | Freq: Once | INTRAVENOUS | Status: AC
  Administered 2024-02-19: 110 mg via INTRAVENOUS
  Filled 2024-02-19: qty 30

## 2024-02-19 MED ORDER — ACETAMINOPHEN 500 MG PO TABS
1000.0000 mg | ORAL_TABLET | Freq: Once | ORAL | Status: AC
Start: 2024-02-19 — End: 2024-02-19
  Administered 2024-02-19: 1000 mg via ORAL

## 2024-02-19 MED ORDER — DIPHENHYDRAMINE HCL 25 MG PO CAPS
50.0000 mg | ORAL_CAPSULE | Freq: Once | ORAL | Status: AC
Start: 2024-02-19 — End: 2024-02-19
  Administered 2024-02-19: 50 mg via ORAL
  Filled 2024-02-19: qty 2

## 2024-02-19 MED ORDER — SODIUM CHLORIDE 0.9 % IV SOLN: INTRAVENOUS | Status: AC

## 2024-02-19 MED ORDER — SODIUM CHLORIDE 0.9% FLUSH
10.0000 mL | INTRAVENOUS | Status: DC | PRN
  Administered 2024-02-19: 10 mL

## 2024-02-19 NOTE — Patient Instructions (Signed)
 [facility] - A DEPT OF [facility]  Discharge Instructions: Thank you for choosing [facility] to provide your oncology and hematology care.   If you have a lab appointment with the Cancer Center, please go directly to the Cancer Center and check in at the registration area.   Wear comfortable clothing and clothing appropriate for easy access to any Portacath or PICC line.   We strive to give you quality time with your provider. You may need to reschedule your appointment if you arrive late (15 or more minutes).  Arriving late affects you and other patients whose appointments are after yours.  Also, if you miss three or more appointments without notifying the office, you may be dismissed from the clinic at the provider's discretion.      For prescription refill requests, have your pharmacy contact our office and allow 72 hours for refills to be completed.    Today you received the following chemotherapy and/or immunotherapy agents: Kyprolis  and Darzalex  Fapsro      To help prevent nausea and vomiting after your treatment, we encourage you to take your nausea medication as directed.  BELOW ARE SYMPTOMS THAT SHOULD BE REPORTED IMMEDIATELY: *FEVER GREATER THAN 100.4 F (38 C) OR HIGHER *CHILLS OR SWEATING *NAUSEA AND VOMITING THAT IS NOT CONTROLLED WITH YOUR NAUSEA MEDICATION *UNUSUAL SHORTNESS OF BREATH *UNUSUAL BRUISING OR BLEEDING *URINARY PROBLEMS (pain or burning when urinating, or frequent urination) *BOWEL PROBLEMS (unusual diarrhea, constipation, pain near the anus) TENDERNESS IN MOUTH AND THROAT WITH OR WITHOUT PRESENCE OF ULCERS (sore throat, sores in mouth, or a toothache) UNUSUAL RASH, SWELLING OR PAIN  UNUSUAL VAGINAL DISCHARGE OR ITCHING   Items with * indicate a potential emergency and should be followed up as soon as possible or go to the Emergency Department if any problems should occur.  Please show the CHEMOTHERAPY ALERT  CARD or IMMUNOTHERAPY ALERT CARD at check-in to the Emergency Department and triage nurse.  Should you have questions after your visit or need to cancel or reschedule your appointment, please contact [facility] - A DEPT OF [facility]  Dept: [phone]  and follow the prompts.  Office hours are 8:00 a.m. to 4:30 p.m. Monday - Friday. Please note that voicemails left after 4:00 p.m. may not be returned until the following business day.  We are closed weekends and major holidays. You have access to a nurse at all times for urgent questions. Please call the main number to the clinic Dept: [phone] and follow the prompts.   For any non-urgent questions, you may also contact your provider using MyChart. We now offer e-Visits for anyone 95 and older to request care online for non-urgent symptoms. For details visit [email or web address].   Also download the MyChart app! Go to the app store, search MyChart, open the app, select [place], and log in with your MyChart username and password.

## 2024-02-20 ENCOUNTER — Other Ambulatory Visit

## 2024-02-20 ENCOUNTER — Other Ambulatory Visit: Payer: Self-pay

## 2024-02-20 ENCOUNTER — Ambulatory Visit

## 2024-02-20 ENCOUNTER — Inpatient Hospital Stay

## 2024-02-20 DIAGNOSIS — M47816 Spondylosis without myelopathy or radiculopathy, lumbar region: Secondary | ICD-10-CM | POA: Diagnosis not present

## 2024-02-22 ENCOUNTER — Other Ambulatory Visit: Payer: Self-pay

## 2024-02-24 LAB — MULTIPLE MYELOMA PANEL, SERUM
Albumin SerPl Elph-Mcnc: 4.5 g/dL — ABNORMAL HIGH (ref 2.9–4.4)
Albumin/Glob SerPl: 1.4 (ref 0.7–1.7)
Alpha 1: 0.2 g/dL (ref 0.0–0.4)
Alpha2 Glob SerPl Elph-Mcnc: 0.5 g/dL (ref 0.4–1.0)
B-Globulin SerPl Elph-Mcnc: 0.9 g/dL (ref 0.7–1.3)
Gamma Glob SerPl Elph-Mcnc: 1.6 g/dL (ref 0.4–1.8)
Globulin, Total: 3.3 g/dL (ref 2.2–3.9)
IgA: 11 mg/dL — ABNORMAL LOW (ref 61–437)
IgG (Immunoglobin G), Serum: 1976 mg/dL — ABNORMAL HIGH (ref 603–1613)
IgM (Immunoglobulin M), Srm: 7 mg/dL — ABNORMAL LOW (ref 15–143)
M Protein SerPl Elph-Mcnc: 1.4 g/dL — ABNORMAL HIGH
Total Protein ELP: 7.8 g/dL (ref 6.0–8.5)

## 2024-02-25 ENCOUNTER — Other Ambulatory Visit (HOSPITAL_COMMUNITY): Payer: Self-pay

## 2024-02-25 MED ORDER — OXYCODONE-ACETAMINOPHEN 10-325 MG PO TABS
1.0000 | ORAL_TABLET | Freq: Two times a day (BID) | ORAL | 0 refills | Status: DC | PRN
  Filled 2024-02-25 – 2024-02-27 (×3): qty 60, 30d supply, fill #0
  Filled ????-??-??: fill #0

## 2024-02-27 ENCOUNTER — Inpatient Hospital Stay

## 2024-02-27 ENCOUNTER — Ambulatory Visit

## 2024-02-27 ENCOUNTER — Other Ambulatory Visit (HOSPITAL_COMMUNITY): Payer: Self-pay

## 2024-02-27 ENCOUNTER — Other Ambulatory Visit

## 2024-02-27 DIAGNOSIS — D84821 Immunodeficiency due to drugs: Secondary | ICD-10-CM | POA: Diagnosis not present

## 2024-02-27 DIAGNOSIS — I714 Abdominal aortic aneurysm, without rupture, unspecified: Secondary | ICD-10-CM | POA: Diagnosis not present

## 2024-02-27 DIAGNOSIS — E7800 Pure hypercholesterolemia, unspecified: Secondary | ICD-10-CM | POA: Diagnosis not present

## 2024-02-27 DIAGNOSIS — I10 Essential (primary) hypertension: Secondary | ICD-10-CM | POA: Diagnosis not present

## 2024-02-27 DIAGNOSIS — M5416 Radiculopathy, lumbar region: Secondary | ICD-10-CM | POA: Diagnosis not present

## 2024-02-27 DIAGNOSIS — C9000 Multiple myeloma not having achieved remission: Secondary | ICD-10-CM | POA: Diagnosis not present

## 2024-02-27 DIAGNOSIS — K219 Gastro-esophageal reflux disease without esophagitis: Secondary | ICD-10-CM | POA: Diagnosis not present

## 2024-02-27 MED ORDER — AMLODIPINE BESYLATE 5 MG PO TABS
5.0000 mg | ORAL_TABLET | Freq: Every day | ORAL | 1 refills | Status: AC
  Filled 2024-06-19: qty 90, 90d supply, fill #0

## 2024-02-27 MED ORDER — PANTOPRAZOLE SODIUM 40 MG PO TBEC
40.0000 mg | DELAYED_RELEASE_TABLET | ORAL | 0 refills | Status: DC
  Filled 2024-02-27: qty 30, 30d supply, fill #0

## 2024-02-27 MED ORDER — LOSARTAN POTASSIUM 100 MG PO TABS
100.0000 mg | ORAL_TABLET | Freq: Every day | ORAL | 1 refills | Status: AC
  Filled 2024-06-19: qty 90, 90d supply, fill #0

## 2024-03-01 ENCOUNTER — Encounter: Payer: Self-pay | Admitting: Hematology

## 2024-03-01 ENCOUNTER — Inpatient Hospital Stay

## 2024-03-01 ENCOUNTER — Other Ambulatory Visit (HOSPITAL_COMMUNITY): Payer: Self-pay

## 2024-03-01 VITALS — BP 181/91 | HR 52 | Resp 18 | Wt 141.2 lb

## 2024-03-01 DIAGNOSIS — Z79620 Long term (current) use of immunosuppressive biologic: Secondary | ICD-10-CM | POA: Diagnosis not present

## 2024-03-01 DIAGNOSIS — C9001 Multiple myeloma in remission: Secondary | ICD-10-CM | POA: Diagnosis not present

## 2024-03-01 DIAGNOSIS — Z95828 Presence of other vascular implants and grafts: Secondary | ICD-10-CM

## 2024-03-01 DIAGNOSIS — Z5112 Encounter for antineoplastic immunotherapy: Secondary | ICD-10-CM | POA: Diagnosis not present

## 2024-03-01 DIAGNOSIS — Z79899 Other long term (current) drug therapy: Secondary | ICD-10-CM | POA: Diagnosis not present

## 2024-03-01 LAB — CBC WITH DIFFERENTIAL (CANCER CENTER ONLY)
Abs Immature Granulocytes: 0.01 K/uL (ref 0.00–0.07)
Basophils Absolute: 0 K/uL (ref 0.0–0.1)
Basophils Relative: 0 %
Eosinophils Absolute: 0 K/uL (ref 0.0–0.5)
Eosinophils Relative: 1 %
HCT: 26.4 % — ABNORMAL LOW (ref 39.0–52.0)
Hemoglobin: 8.9 g/dL — ABNORMAL LOW (ref 13.0–17.0)
Immature Granulocytes: 0 %
Lymphocytes Relative: 38 %
Lymphs Abs: 1.3 K/uL (ref 0.7–4.0)
MCH: 35.3 pg — ABNORMAL HIGH (ref 26.0–34.0)
MCHC: 33.7 g/dL (ref 30.0–36.0)
MCV: 104.8 fL — ABNORMAL HIGH (ref 80.0–100.0)
Monocytes Absolute: 0.3 K/uL (ref 0.1–1.0)
Monocytes Relative: 9 %
Neutro Abs: 1.8 K/uL (ref 1.7–7.7)
Neutrophils Relative %: 52 %
Platelet Count: 169 K/uL (ref 150–400)
RBC: 2.52 MIL/uL — ABNORMAL LOW (ref 4.22–5.81)
RDW: 14.4 % (ref 11.5–15.5)
WBC Count: 3.4 K/uL — ABNORMAL LOW (ref 4.0–10.5)
nRBC: 0 % (ref 0.0–0.2)

## 2024-03-01 LAB — COMPREHENSIVE METABOLIC PANEL WITH GFR
ALT: 65 U/L — ABNORMAL HIGH (ref 0–44)
AST: 36 U/L (ref 15–41)
Albumin: 4.4 g/dL (ref 3.5–5.0)
Alkaline Phosphatase: 88 U/L (ref 38–126)
Anion gap: 4 — ABNORMAL LOW (ref 5–15)
BUN: 17 mg/dL (ref 8–23)
CO2: 25 mmol/L (ref 22–32)
Calcium: 8.8 mg/dL — ABNORMAL LOW (ref 8.9–10.3)
Chloride: 105 mmol/L (ref 98–111)
Creatinine, Ser: 0.8 mg/dL (ref 0.61–1.24)
GFR, Estimated: >60 mL/min (ref >60)
Glucose, Bld: 95 mg/dL (ref 70–99)
Potassium: 4.1 mmol/L (ref 3.5–5.1)
Sodium: 134 mmol/L — ABNORMAL LOW (ref 135–145)
Total Bilirubin: 0.4 mg/dL (ref 0.0–1.2)
Total Protein: 7.6 g/dL (ref 6.5–8.1)

## 2024-03-01 MED ORDER — DEXTROSE 5 % IV SOLN
56.0000 mg/m2 | Freq: Once | INTRAVENOUS | Status: AC
  Administered 2024-03-01: 110 mg via INTRAVENOUS
  Filled 2024-03-01: qty 30

## 2024-03-01 MED ORDER — DIPHENHYDRAMINE HCL 25 MG PO CAPS
50.0000 mg | ORAL_CAPSULE | Freq: Once | ORAL | Status: AC
  Administered 2024-03-01: 50 mg via ORAL
  Filled 2024-03-01: qty 2

## 2024-03-01 MED ORDER — SODIUM CHLORIDE 0.9% FLUSH
10.0000 mL | INTRAVENOUS | Status: DC | PRN
  Administered 2024-03-01: 10 mL

## 2024-03-01 MED ORDER — SODIUM CHLORIDE 0.9 % IV SOLN: INTRAVENOUS | Status: AC

## 2024-03-01 MED ORDER — DEXAMETHASONE 4 MG PO TABS
8.0000 mg | ORAL_TABLET | Freq: Once | ORAL | Status: AC
  Administered 2024-03-01: 8 mg via ORAL
  Filled 2024-03-01: qty 2

## 2024-03-01 MED ORDER — ACETAMINOPHEN 500 MG PO TABS
1000.0000 mg | ORAL_TABLET | Freq: Once | ORAL | Status: AC
  Administered 2024-03-01: 1000 mg via ORAL
  Filled 2024-03-01: qty 2

## 2024-03-01 NOTE — Patient Instructions (Signed)
 [facility] - A DEPT OF [facility]  Discharge Instructions: Thank you for choosing [facility] to provide your oncology and hematology care.   If you have a lab appointment with the Cancer Center, please go directly to the Cancer Center and check in at the registration area.   Wear comfortable clothing and clothing appropriate for easy access to any Portacath or PICC line.   We strive to give you quality time with your provider. You may need to reschedule your appointment if you arrive late (15 or more minutes).  Arriving late affects you and other patients whose appointments are after yours.  Also, if you miss three or more appointments without notifying the office, you may be dismissed from the clinic at the provider's discretion.      For prescription refill requests, have your pharmacy contact our office and allow 72 hours for refills to be completed.    Today you received the following chemotherapy and/or immunotherapy agents kyprolis      To help prevent nausea and vomiting after your treatment, we encourage you to take your nausea medication as directed.  BELOW ARE SYMPTOMS THAT SHOULD BE REPORTED IMMEDIATELY: *FEVER GREATER THAN 100.4 F (38 C) OR HIGHER *CHILLS OR SWEATING *NAUSEA AND VOMITING THAT IS NOT CONTROLLED WITH YOUR NAUSEA MEDICATION *UNUSUAL SHORTNESS OF BREATH *UNUSUAL BRUISING OR BLEEDING *URINARY PROBLEMS (pain or burning when urinating, or frequent urination) *BOWEL PROBLEMS (unusual diarrhea, constipation, pain near the anus) TENDERNESS IN MOUTH AND THROAT WITH OR WITHOUT PRESENCE OF ULCERS (sore throat, sores in mouth, or a toothache) UNUSUAL RASH, SWELLING OR PAIN  UNUSUAL VAGINAL DISCHARGE OR ITCHING   Items with * indicate a potential emergency and should be followed up as soon as possible or go to the Emergency Department if any problems should occur.  Please show the CHEMOTHERAPY ALERT CARD or IMMUNOTHERAPY  ALERT CARD at check-in to the Emergency Department and triage nurse.  Should you have questions after your visit or need to cancel or reschedule your appointment, please contact [facility] - A DEPT OF [facility]  Dept: [phone]  and follow the prompts.  Office hours are 8:00 a.m. to 4:30 p.m. Monday - Friday. Please note that voicemails left after 4:00 p.m. may not be returned until the following business day.  We are closed weekends and major holidays. You have access to a nurse at all times for urgent questions. Please call the main number to the clinic Dept: [phone] and follow the prompts.   For any non-urgent questions, you may also contact your provider using MyChart. We now offer e-Visits for anyone 80 and older to request care online for non-urgent symptoms. For details visit [email or web address].   Also download the MyChart app! Go to the app store, search "MyChart", open the app, select [place], and log in with your MyChart username and password.

## 2024-03-03 ENCOUNTER — Other Ambulatory Visit: Payer: Self-pay

## 2024-03-05 ENCOUNTER — Inpatient Hospital Stay

## 2024-03-05 ENCOUNTER — Inpatient Hospital Stay: Admitting: Hematology

## 2024-03-08 ENCOUNTER — Inpatient Hospital Stay

## 2024-03-08 ENCOUNTER — Inpatient Hospital Stay (HOSPITAL_BASED_OUTPATIENT_CLINIC_OR_DEPARTMENT_OTHER): Admitting: Hematology

## 2024-03-08 VITALS — BP 208/92 | HR 52 | Temp 97.7°F | Resp 20 | Wt 143.4 lb

## 2024-03-08 DIAGNOSIS — Z79899 Other long term (current) drug therapy: Secondary | ICD-10-CM | POA: Diagnosis not present

## 2024-03-08 DIAGNOSIS — C9001 Multiple myeloma in remission: Secondary | ICD-10-CM | POA: Diagnosis not present

## 2024-03-08 DIAGNOSIS — Z5112 Encounter for antineoplastic immunotherapy: Secondary | ICD-10-CM

## 2024-03-08 DIAGNOSIS — Z79620 Long term (current) use of immunosuppressive biologic: Secondary | ICD-10-CM | POA: Diagnosis not present

## 2024-03-08 LAB — COMPREHENSIVE METABOLIC PANEL WITH GFR
ALT: 28 U/L (ref 0–44)
AST: 17 U/L (ref 15–41)
Albumin: 4.2 g/dL (ref 3.5–5.0)
Alkaline Phosphatase: 77 U/L (ref 38–126)
Anion gap: 4 — ABNORMAL LOW (ref 5–15)
BUN: 9 mg/dL (ref 8–23)
CO2: 28 mmol/L (ref 22–32)
Calcium: 8.8 mg/dL — ABNORMAL LOW (ref 8.9–10.3)
Chloride: 106 mmol/L (ref 98–111)
Creatinine, Ser: 0.7 mg/dL (ref 0.61–1.24)
GFR, Estimated: >60 mL/min (ref >60)
Glucose, Bld: 84 mg/dL (ref 70–99)
Potassium: 4 mmol/L (ref 3.5–5.1)
Sodium: 138 mmol/L (ref 135–145)
Total Bilirubin: 0.5 mg/dL (ref 0.0–1.2)
Total Protein: 7.1 g/dL (ref 6.5–8.1)

## 2024-03-08 LAB — CBC WITH DIFFERENTIAL (CANCER CENTER ONLY)
Abs Immature Granulocytes: 0.01 K/uL (ref 0.00–0.07)
Basophils Absolute: 0 K/uL (ref 0.0–0.1)
Basophils Relative: 0 %
Eosinophils Absolute: 0 K/uL (ref 0.0–0.5)
Eosinophils Relative: 1 %
HCT: 25 % — ABNORMAL LOW (ref 39.0–52.0)
Hemoglobin: 8.6 g/dL — ABNORMAL LOW (ref 13.0–17.0)
Immature Granulocytes: 0 %
Lymphocytes Relative: 31 %
Lymphs Abs: 1.2 K/uL (ref 0.7–4.0)
MCH: 35.7 pg — ABNORMAL HIGH (ref 26.0–34.0)
MCHC: 34.4 g/dL (ref 30.0–36.0)
MCV: 103.7 fL — ABNORMAL HIGH (ref 80.0–100.0)
Monocytes Absolute: 0.5 K/uL (ref 0.1–1.0)
Monocytes Relative: 13 %
Neutro Abs: 2.1 K/uL (ref 1.7–7.7)
Neutrophils Relative %: 55 %
Platelet Count: 166 K/uL (ref 150–400)
RBC: 2.41 MIL/uL — ABNORMAL LOW (ref 4.22–5.81)
RDW: 13.5 % (ref 11.5–15.5)
WBC Count: 3.8 K/uL — ABNORMAL LOW (ref 4.0–10.5)
nRBC: 0 % (ref 0.0–0.2)

## 2024-03-08 MED ORDER — SODIUM CHLORIDE 0.9 % IV SOLN: INTRAVENOUS | Status: AC

## 2024-03-08 MED ORDER — DARATUMUMAB-HYALURONIDASE-FIHJ 1800-30000 MG-UT/15ML ~~LOC~~ SOLN
1800.0000 mg | Freq: Once | SUBCUTANEOUS | Status: AC
  Administered 2024-03-08: 1800 mg via SUBCUTANEOUS
  Filled 2024-03-08: qty 15

## 2024-03-08 MED ORDER — DEXTROSE 5 % IV SOLN
36.0000 mg/m2 | Freq: Once | INTRAVENOUS | Status: AC
  Administered 2024-03-08: 70 mg via INTRAVENOUS
  Filled 2024-03-08: qty 30

## 2024-03-08 MED ORDER — ACETAMINOPHEN 500 MG PO TABS
1000.0000 mg | ORAL_TABLET | Freq: Once | ORAL | Status: AC
  Administered 2024-03-08: 1000 mg via ORAL
  Filled 2024-03-08: qty 2

## 2024-03-08 MED ORDER — DIPHENHYDRAMINE HCL 25 MG PO CAPS
50.0000 mg | ORAL_CAPSULE | Freq: Once | ORAL | Status: AC
  Administered 2024-03-08: 50 mg via ORAL
  Filled 2024-03-08: qty 2

## 2024-03-08 MED ORDER — DEXAMETHASONE 4 MG PO TABS
8.0000 mg | ORAL_TABLET | Freq: Once | ORAL | Status: AC
  Administered 2024-03-08: 8 mg via ORAL
  Filled 2024-03-08: qty 2

## 2024-03-08 NOTE — Patient Instructions (Addendum)
 [facility] - A DEPT OF [facility]  Discharge Instructions: Thank you for choosing [facility] to provide your oncology and hematology care.   If you have a lab appointment with the Cancer Center, please go directly to the Cancer Center and check in at the registration area.   Wear comfortable clothing and clothing appropriate for easy access to any Portacath or PICC line.   We strive to give you quality time with your provider. You may need to reschedule your appointment if you arrive late (15 or more minutes).  Arriving late affects you and other patients whose appointments are after yours.  Also, if you miss three or more appointments without notifying the office, you may be dismissed from the clinic at the provider's discretion.      For prescription refill requests, have your pharmacy contact our office and allow 72 hours for refills to be completed.    Today you received the following chemotherapy and/or immunotherapy agents: Carfilzomib  & Darzalex  faspro    To help prevent nausea and vomiting after your treatment, we encourage you to take your nausea medication as directed.  BELOW ARE SYMPTOMS THAT SHOULD BE REPORTED IMMEDIATELY: *FEVER GREATER THAN 100.4 F (38 C) OR HIGHER *CHILLS OR SWEATING *NAUSEA AND VOMITING THAT IS NOT CONTROLLED WITH YOUR NAUSEA MEDICATION *UNUSUAL SHORTNESS OF BREATH *UNUSUAL BRUISING OR BLEEDING *URINARY PROBLEMS (pain or burning when urinating, or frequent urination) *BOWEL PROBLEMS (unusual diarrhea, constipation, pain near the anus) TENDERNESS IN MOUTH AND THROAT WITH OR WITHOUT PRESENCE OF ULCERS (sore throat, sores in mouth, or a toothache) UNUSUAL RASH, SWELLING OR PAIN  UNUSUAL VAGINAL DISCHARGE OR ITCHING   Items with * indicate a potential emergency and should be followed up as soon as possible or go to the Emergency Department if any problems should occur.  Please show the CHEMOTHERAPY ALERT CARD  or IMMUNOTHERAPY ALERT CARD at check-in to the Emergency Department and triage nurse.  Should you have questions after your visit or need to cancel or reschedule your appointment, please contact [facility]  Dept: [phone]  and follow the prompts.  Office hours are 8:00 a.m. to 4:30 p.m. Monday - Friday. Please note that voicemails left after 4:00 p.m. may not be returned until the following business day.  We are closed weekends and major holidays. You have access to a nurse at all times for urgent questions. Please call the main number to the clinic Dept: [phone] and follow the prompts.   For any non-urgent questions, you may also contact your provider using MyChart. We now offer e-Visits for anyone 68 and older to request care online for non-urgent symptoms. For details visit [email or web address].   Also download the MyChart app! Go to the app store, search MyChart, open the app, select , and log in with your MyChart username and password.

## 2024-03-11 ENCOUNTER — Other Ambulatory Visit: Payer: Self-pay

## 2024-03-11 DIAGNOSIS — C9001 Multiple myeloma in remission: Secondary | ICD-10-CM

## 2024-03-12 ENCOUNTER — Other Ambulatory Visit (HOSPITAL_COMMUNITY): Payer: Self-pay

## 2024-03-12 DIAGNOSIS — D649 Anemia, unspecified: Secondary | ICD-10-CM | POA: Diagnosis not present

## 2024-03-12 DIAGNOSIS — M5416 Radiculopathy, lumbar region: Secondary | ICD-10-CM | POA: Diagnosis not present

## 2024-03-12 DIAGNOSIS — C9000 Multiple myeloma not having achieved remission: Secondary | ICD-10-CM | POA: Diagnosis not present

## 2024-03-12 DIAGNOSIS — I10 Essential (primary) hypertension: Secondary | ICD-10-CM | POA: Diagnosis not present

## 2024-03-13 ENCOUNTER — Other Ambulatory Visit: Payer: Self-pay

## 2024-03-13 ENCOUNTER — Emergency Department (HOSPITAL_COMMUNITY)
Admission: EM | Admit: 2024-03-13 | Discharge: 2024-03-13 | Disposition: A | Discharge status: Home / Self Care | Attending: Emergency Medicine | Admitting: Emergency Medicine

## 2024-03-13 ENCOUNTER — Encounter (HOSPITAL_COMMUNITY): Payer: Self-pay

## 2024-03-13 ENCOUNTER — Other Ambulatory Visit (HOSPITAL_COMMUNITY): Payer: Self-pay

## 2024-03-13 ENCOUNTER — Encounter: Payer: Self-pay | Admitting: Hematology

## 2024-03-13 DIAGNOSIS — M5441 Lumbago with sciatica, right side: Secondary | ICD-10-CM | POA: Insufficient documentation

## 2024-03-13 DIAGNOSIS — I10 Essential (primary) hypertension: Secondary | ICD-10-CM | POA: Diagnosis not present

## 2024-03-13 DIAGNOSIS — G8929 Other chronic pain: Secondary | ICD-10-CM | POA: Diagnosis not present

## 2024-03-13 DIAGNOSIS — Z79899 Other long term (current) drug therapy: Secondary | ICD-10-CM | POA: Insufficient documentation

## 2024-03-13 DIAGNOSIS — Z8579 Personal history of other malignant neoplasms of lymphoid, hematopoietic and related tissues: Secondary | ICD-10-CM | POA: Insufficient documentation

## 2024-03-13 DIAGNOSIS — M545 Low back pain, unspecified: Secondary | ICD-10-CM | POA: Diagnosis present

## 2024-03-13 MED ORDER — LIDOCAINE 5 % EX PTCH
1.0000 | MEDICATED_PATCH | CUTANEOUS | 0 refills | Status: DC
  Filled 2024-03-13: qty 30, 30d supply, fill #0

## 2024-03-13 MED ORDER — LIDOCAINE 5 % EX PTCH
1.0000 | MEDICATED_PATCH | CUTANEOUS | Status: DC
  Administered 2024-03-13: 1 via TRANSDERMAL
  Filled 2024-03-13: qty 1

## 2024-03-13 MED ORDER — ACETAMINOPHEN 500 MG PO TABS
1000.0000 mg | ORAL_TABLET | Freq: Once | ORAL | Status: AC
  Administered 2024-03-13: 1000 mg via ORAL
  Filled 2024-03-13: qty 2

## 2024-03-13 MED ORDER — FENTANYL CITRATE PF 50 MCG/ML IJ SOSY
50.0000 ug | PREFILLED_SYRINGE | Freq: Once | INTRAMUSCULAR | Status: AC
  Administered 2024-03-13: 50 ug via INTRAMUSCULAR
  Filled 2024-03-13: qty 1

## 2024-03-13 NOTE — ED Triage Notes (Signed)
 Patient complains of chronic back pain and needing pain meds.  Saw PCP  yesterday for same.  Denies incontinence or numbness or tingling down either leg.

## 2024-03-13 NOTE — Discharge Instructions (Signed)
 You will need to follow-up with your pain management appointment on 03/23/2024.  Please also follow-up with your oncologist and primary care provider.  Seek emergency care if experiencing any new or worsening symptoms.  Alternating between 650 mg Tylenol  and 400 mg Advil : The best way to alternate taking Acetaminophen  (example Tylenol ) and Ibuprofen  (example Advil /Motrin ) is to take them 3 hours apart. For example, if you take ibuprofen  at 6 am you can then take Tylenol  at 9 am. You can continue this regimen throughout the day, making sure you do not exceed the recommended maximum dose for each drug.

## 2024-03-13 NOTE — ED Provider Notes (Signed)
 [place] EMERGENCY DEPARTMENT AT [facility] Provider Note   CSN: [id number] Arrival date & time: 03/13/24  9055     Patient presents with: Back Pain   Travis Nguyen is a 73 y.o. male with PMHx anemia, OA, HTN, multiple myeloma who presents to ED concerned for back pain. Patient's back pain has been chronic for years d/t multiple myeloma which he follows with pain clinic and an oncologist for. Patient stating that he has not taken any medications for his back pain in weeks. He states that his body is not responding to his narcotic pain medication anymore. Patient specifically requesting a morphine  or fentanyl  injection stating that this helps him get his pain under control. Patient denies worsening of pain today - states that it has been the same for years.   Patient denies urinary retention, fecal incontinence, saddle anesthesia, lower extremity weakness,  fever, IVDU, spinal procedure, significant trauma.    Back Pain      Prior to Admission medications   Medication Sig Start Date End Date Taking? Authorizing Provider  lidocaine  (LIDODERM ) 5 % Place 1 patch onto the skin daily. Remove & Discard patch within 12 hours or as directed by MD 03/13/24  Yes Hoy Nidia FALCON, PA-C  acyclovir  (ZOVIRAX ) 400 MG tablet Take 1 tablet (400 mg total) by mouth 2 (two) times daily. 11/28/23   Onesimo Emaline Brink, MD  amLODipine  (NORVASC ) 5 MG tablet Take 1 tablet (5 mg total) by mouth daily. 08/19/23     amLODipine  (NORVASC ) 5 MG tablet Take 1 tablet (5 mg total) by mouth daily. 02/27/24     benzonatate  (TESSALON ) 100 MG capsule Take 1 capsule (100 mg total) by mouth every 8 (eight) hours. 01/05/24   Geiple, Joshua, PA-C  Cyanocobalamin (VITAMIN B12 PO) Take by mouth daily.    [provider]  diazepam  (VALIUM ) 5 MG tablet Take 1 tablet (5 mg total) by mouth every 12 (twelve) hours as needed for muscle spasms. 03/31/23   Pamella Ozell LABOR, DO  docusate sodium  (COLACE) 100 MG  capsule Take 1 capsule (100 mg total) by mouth every 12 (twelve) hours. 11/12/23   Geiple, Joshua, PA-C  lidocaine -prilocaine  (EMLA ) cream APPLY TOPICALLY TO PORT-A-CATH DAILY AS NEEDED 06/26/23 06/25/24  Kale, [name], MD  losartan  (COZAAR ) 100 MG tablet Take 100 mg by mouth daily.    [provider]  losartan  (COZAAR ) 100 MG tablet Take 1 tablet (100 mg total) by mouth daily. 02/27/24     oxyCODONE -acetaminophen  (PERCOCET ) 10-325 MG tablet Take 1 tablet by mouth 2 times a day as needed orally (ok to fill 30 days after last refill) 02/25/22     oxyCODONE -acetaminophen  (PERCOCET ) 10-325 MG tablet Take 1 tablet by mouth 2 (two) times daily. 12/22/23     oxyCODONE -acetaminophen  (PERCOCET ) 10-325 MG tablet Take 1 tablet by mouth 2 (two) times daily as needed. 02/25/24     pantoprazole  (PROTONIX ) 40 MG tablet Take 1 tablet (40 mg total) by mouth every morning 1/2-1 hour before breakfast. 02/27/24     senna-docusate (SENNA S) 8.6-50 MG tablet Take 2 tablets by mouth at bedtime as needed for mild constipation. 12/18/23   Onesimo Emaline Brink, MD  vitamin C (ASCORBIC ACID) 500 MG tablet Take 500 mg by mouth daily.    [provider]    Allergies: Aspirin    Review of Systems  Musculoskeletal:  Positive for back pain.    Updated Vital Signs BP (!) 177/93 (BP Location: Left Arm)  Pulse 78   Temp 98.8 F (37.1 C) (Oral)   Resp 18   Ht 6' 1 (1.854 m)   Wt 64.9 kg   SpO2 100%   BMI 18.87 kg/m   Physical Exam Vitals and nursing note reviewed.  Constitutional:      General: He is not in acute distress.    Appearance: He is not ill-appearing or toxic-appearing.  HENT:     Head: Normocephalic and atraumatic.     Mouth/Throat:     Mouth: Mucous membranes are moist.  Eyes:     General: No scleral icterus.       Right eye: No discharge.        Left eye: No discharge.     Conjunctiva/sclera: Conjunctivae normal.  Cardiovascular:     Rate and Rhythm: Normal rate and regular  rhythm.     Pulses: Normal pulses.     Heart sounds: Normal heart sounds. No murmur heard. Pulmonary:     Effort: Pulmonary effort is normal. No respiratory distress.     Breath sounds: Normal breath sounds. No wheezing, rhonchi or rales.  Abdominal:     General: Abdomen is flat. Bowel sounds are normal. There is no distension.     Palpations: Abdomen is soft.  Musculoskeletal:     Right lower leg: No edema.     Left lower leg: No edema.  Skin:    General: Skin is warm and dry.     Findings: No rash.  Neurological:     General: No focal deficit present.     Mental Status: He is alert. Mental status is at baseline.     Comments: No saddle anesthesia. No swelling or erythema of spine. SABRA  Psychiatric:        Mood and Affect: Mood normal.     (all labs ordered are listed, but only abnormal results are displayed) Labs Reviewed - No data to display  EKG: None  Radiology: No results found.   Procedures   Medications Ordered in the ED  acetaminophen  (TYLENOL ) tablet 1,000 mg (has no administration in time range)  lidocaine  (LIDODERM ) 5 % 1 patch (has no administration in time range)  fentaNYL  (SUBLIMAZE ) injection 50 mcg (has no administration in time range)                                    Medical Decision Making  This patient presents to the ED for concern of back pain, this involves an extensive number of treatment options, and is a complaint that carries with it a high risk of complications and morbidity.  The differential diagnosis includes pyelonephritis, nephrolithiasis, spinal abscess, osteomyelitis, herniated disc, muscle strain, spinal fracture, meningitis, cancer, cauda equina syndrome.   Co morbidities that complicate the patient evaluation  anemia, OA, HTN, multiple myeloma    Additional history obtained:  Additional history obtained from yesterday's PCP note:  I agree with his plan to establish with the pain clinic on 03/23/2024.  He is going to make  sure that they send me a copy of the consult note with him.  I did ask him if they required a formal referral for me to establish, but advises me today that he does not need a formal referral.  He does still have his oxycodone  that I prescribed earlier this month, which should last him until the time of establishing with a pain clinic.  He will let  me know if I can be of any additional assistance.   Problem List / ED Course / Critical interventions / Medication management  Patient presents ED concern for his chronic back pain intermittent abdominal trauma.  Denies worsening of pain recently.  Patient states he understands a fentanyl  injection.  Patient follows up closely with his PCP and oncologist.  Patient also with a pain management appointment on 03/23/2024. Patient denies urinary retention, fecal incontinence, saddle anesthesia, lower extremity weakness, fever, immunosuppression, IVDU, spinal procedure, significant trauma - so imaging was not appropriate at this time. This pain is also chronic and non-changing. Will proceed with conservative therapy and have patient follow up with PCP. Patient given tylenol , lidocaine , and IM fentanyl . Patient to follow up with outpatient providers. Staffed with Dr. Bernard who agrees with plan. I have reviewed the patients home medicines and have made adjustments as needed The patient has been appropriately medically screened and/or stabilized in the ED. I have low suspicion for any other emergent medical condition which would require further screening, evaluation or treatment in the ED or require inpatient management. At time of discharge the patient is hemodynamically stable and in no acute distress. I have discussed work-up results and diagnosis with patient and answered all questions. Patient is agreeable with discharge plan. We discussed strict return precautions for returning to the emergency department and they verbalized understanding.    Social Determinants  of Health:  Cancer patient      Final diagnoses:  Chronic midline low back pain with right-sided sciatica    ED Discharge Orders          Ordered    lidocaine  (LIDODERM ) 5 %  Every 24 hours        03/13/24 1051               Hoy Nidia FALCON, [place] 03/13/24 1052    Bernard Drivers, MD 03/14/24 [phone]

## 2024-03-15 ENCOUNTER — Other Ambulatory Visit: Payer: Self-pay

## 2024-03-15 ENCOUNTER — Encounter (HOSPITAL_COMMUNITY): Payer: Self-pay

## 2024-03-15 ENCOUNTER — Emergency Department (HOSPITAL_COMMUNITY)
Admission: EM | Admit: 2024-03-15 | Discharge: 2024-03-16 | Disposition: A | Discharge status: Home / Self Care | Attending: Emergency Medicine | Admitting: Emergency Medicine

## 2024-03-15 DIAGNOSIS — M545 Low back pain, unspecified: Secondary | ICD-10-CM | POA: Diagnosis not present

## 2024-03-15 DIAGNOSIS — Z79899 Other long term (current) drug therapy: Secondary | ICD-10-CM | POA: Insufficient documentation

## 2024-03-15 DIAGNOSIS — M549 Dorsalgia, unspecified: Secondary | ICD-10-CM | POA: Diagnosis not present

## 2024-03-15 DIAGNOSIS — I10 Essential (primary) hypertension: Secondary | ICD-10-CM | POA: Insufficient documentation

## 2024-03-15 DIAGNOSIS — G8929 Other chronic pain: Secondary | ICD-10-CM | POA: Diagnosis not present

## 2024-03-15 DIAGNOSIS — D539 Nutritional anemia, unspecified: Secondary | ICD-10-CM | POA: Diagnosis not present

## 2024-03-15 DIAGNOSIS — D649 Anemia, unspecified: Secondary | ICD-10-CM | POA: Diagnosis not present

## 2024-03-15 LAB — URINALYSIS, ROUTINE W REFLEX MICROSCOPIC
Bilirubin Urine: NEGATIVE
Glucose, UA: NEGATIVE mg/dL
Hgb urine dipstick: NEGATIVE
Ketones, ur: NEGATIVE mg/dL
Leukocytes,Ua: NEGATIVE
Nitrite: NEGATIVE
Protein, ur: NEGATIVE mg/dL
Specific Gravity, Urine: 1.01 (ref 1.005–1.030)
pH: 6 (ref 5.0–8.0)

## 2024-03-15 LAB — COMPREHENSIVE METABOLIC PANEL WITH GFR
ALT: 25 U/L (ref 0–44)
AST: 31 U/L (ref 15–41)
Albumin: 4 g/dL (ref 3.5–5.0)
Alkaline Phosphatase: 85 U/L (ref 38–126)
Anion gap: 10 (ref 5–15)
BUN: 14 mg/dL (ref 8–23)
CO2: 22 mmol/L (ref 22–32)
Calcium: 9.1 mg/dL (ref 8.9–10.3)
Chloride: 108 mmol/L (ref 98–111)
Creatinine, Ser: 0.77 mg/dL (ref 0.61–1.24)
GFR, Estimated: >60 mL/min (ref >60)
Glucose, Bld: 96 mg/dL (ref 70–99)
Potassium: 4 mmol/L (ref 3.5–5.1)
Sodium: 139 mmol/L (ref 135–145)
Total Bilirubin: 0.3 mg/dL (ref 0.0–1.2)
Total Protein: 7.1 g/dL (ref 6.5–8.1)

## 2024-03-15 LAB — CBC
HCT: 27.3 % — ABNORMAL LOW (ref 39.0–52.0)
Hemoglobin: 8.9 g/dL — ABNORMAL LOW (ref 13.0–17.0)
MCH: 35.6 pg — ABNORMAL HIGH (ref 26.0–34.0)
MCHC: 32.6 g/dL (ref 30.0–36.0)
MCV: 109.2 fL — ABNORMAL HIGH (ref 80.0–100.0)
Platelets: 244 K/uL (ref 150–400)
RBC: 2.5 MIL/uL — ABNORMAL LOW (ref 4.22–5.81)
RDW: 14.4 % (ref 11.5–15.5)
WBC: 4.6 K/uL (ref 4.0–10.5)
nRBC: 0 % (ref 0.0–0.2)

## 2024-03-15 LAB — CBG MONITORING, ED: Glucose-Capillary: 102 mg/dL — ABNORMAL HIGH (ref 70–99)

## 2024-03-15 MED ORDER — ONDANSETRON HCL 4 MG/2ML IJ SOLN
4.0000 mg | Freq: Once | INTRAMUSCULAR | Status: AC
  Administered 2024-03-15: 4 mg via INTRAVENOUS
  Filled 2024-03-15: qty 2

## 2024-03-15 MED ORDER — HYDROMORPHONE HCL 1 MG/ML IJ SOLN
1.0000 mg | Freq: Once | INTRAMUSCULAR | Status: AC
  Administered 2024-03-15: 1 mg via INTRAVENOUS
  Filled 2024-03-15: qty 1

## 2024-03-15 MED ORDER — METHOCARBAMOL 500 MG PO TABS
1000.0000 mg | ORAL_TABLET | Freq: Once | ORAL | Status: AC
  Administered 2024-03-15: 1000 mg via ORAL
  Filled 2024-03-15: qty 2

## 2024-03-15 NOTE — ED Triage Notes (Signed)
 Pt reports dizziness and associated headache

## 2024-03-15 NOTE — ED Triage Notes (Signed)
 Hx of degenerative disc disease and bone cancer. Out of pain meds for past week. Hx of HTN as well. 209/91 at this time. Called EMS for pain in lower back shooting down both legs. Has been taking 10 tylenol  every 8 hours for past 2 days. Last dose around 5pm.   Last VS for EMS:  222/104, HR 68, 18 Resp, ABG 115, 99% RA

## 2024-03-15 NOTE — ED Provider Notes (Signed)
 [place] EMERGENCY DEPARTMENT AT [facility] Provider Note   CSN: [id number] Arrival date & time: 03/15/24  2136     Patient presents with: Hypertension   Travis Nguyen is a 73 y.o. male.  {Add pertinent medical, surgical, social history, OB history to YEP:67052} The history is provided by the patient.  Hypertension   He has history of hypertension, prostate cancer, multiple myeloma and comes in for intractable low back pain.  He states the pain is related to cancer.  He has been taking oxycodone -acetaminophen  for pain for a long time but it has not been helping.  He states for the last 4 days he has been taking 8 acetaminophen  tablets at a time every 8 hours and that is not giving him any relief either.  Pain does radiate down both legs.  He denies any weakness or numbness and denies any bowel or bladder dysfunction.  He also relates that he has expressed to his wife that because of his pain he wished that he was not here anymore but he denies current suicidal thoughts denies any specific suicidal plan.  He does have an appointment to become established at a pain management clinic with initial appointment of September 9.    Prior to Admission medications   Medication Sig Start Date End Date Taking? Authorizing Provider  acyclovir  (ZOVIRAX ) 400 MG tablet Take 1 tablet (400 mg total) by mouth 2 (two) times daily. 11/28/23   Onesimo Emaline Brink, MD  amLODipine  (NORVASC ) 5 MG tablet Take 1 tablet (5 mg total) by mouth daily. 08/19/23     amLODipine  (NORVASC ) 5 MG tablet Take 1 tablet (5 mg total) by mouth daily. 02/27/24     benzonatate  (TESSALON ) 100 MG capsule Take 1 capsule (100 mg total) by mouth every 8 (eight) hours. 01/05/24   Geiple, Joshua, PA-C  Cyanocobalamin (VITAMIN B12 PO) Take by mouth daily.    [provider]  diazepam  (VALIUM ) 5 MG tablet Take 1 tablet (5 mg total) by mouth every 12 (twelve) hours as needed for muscle spasms. 03/31/23   Pamella Ozell LABOR, DO  docusate sodium  (COLACE) 100 MG capsule Take 1 capsule (100 mg total) by mouth every 12 (twelve) hours. 11/12/23   Desiderio Chew, PA-C  lidocaine  (LIDODERM ) 5 % Place 1 patch onto the skin daily. Remove & Discard patch within 12 hours or as directed by MD 03/13/24   Hoy Nidia FALCON, PA-C  lidocaine -prilocaine  (EMLA ) cream APPLY TOPICALLY TO PORT-A-CATH DAILY AS NEEDED 06/26/23 06/25/24  Onesimo Emaline Brink, MD  losartan  (COZAAR ) 100 MG tablet Take 100 mg by mouth daily.    [provider]  losartan  (COZAAR ) 100 MG tablet Take 1 tablet (100 mg total) by mouth daily. 02/27/24     oxyCODONE -acetaminophen  (PERCOCET ) 10-325 MG tablet Take 1 tablet by mouth 2 times a day as needed orally (ok to fill 30 days after last refill) 02/25/22     oxyCODONE -acetaminophen  (PERCOCET ) 10-325 MG tablet Take 1 tablet by mouth 2 (two) times daily. 12/22/23     oxyCODONE -acetaminophen  (PERCOCET ) 10-325 MG tablet Take 1 tablet by mouth 2 (two) times daily as needed. 02/25/24     pantoprazole  (PROTONIX ) 40 MG tablet Take 1 tablet (40 mg total) by mouth every morning 1/2-1 hour before breakfast. 02/27/24     senna-docusate (SENNA S) 8.6-50 MG tablet Take 2 tablets by mouth at bedtime as needed for mild constipation. 12/18/23   Kale, [name], MD  vitamin C (ASCORBIC ACID) 500 MG  tablet Take 500 mg by mouth daily.    [provider]    Allergies: Aspirin    Review of Systems  All other systems reviewed and are negative.   Updated Vital Signs BP (!) 157/109   Pulse 80   Temp 98.7 F (37.1 C) (Oral)   Resp (!) 22   Ht 6' 1 (1.854 m)   Wt 65.8 kg   SpO2 100%   BMI 19.13 kg/m   Physical Exam Vitals and nursing note reviewed.   73 year old male, resting comfortably and in no acute distress. Vital signs are significant for elevated blood pressure and borderline elevated respiratory rate. Oxygen saturation is 100%, which is normal. Head is normocephalic and atraumatic. PERRLA, EOMI.  Neck is nontender and supple. Back is tender in the lower lumbar area without point tenderness.  There is mild bilateral paralumbar spasm.  Straight leg raise is positive bilaterally at 30 degrees. Lungs are clear without rales, wheezes, or rhonchi. Chest is nontender. Heart has regular rate and rhythm with 2/6 systolic ejection murmur. Abdomen is soft, flat, nontender. Extremities have no cyanosis or edema, full range of motion is present. Skin is warm and dry without rash. Neurologic: Mental status is normal, cranial nerves are intact, strength is 5/5 in all 4 extremities, sensory exam is normal.  (all labs ordered are listed, but only abnormal results are displayed) Labs Reviewed  CBC - Abnormal; Notable for the following components:      Result Value   RBC 2.50 (*)    Hemoglobin 8.9 (*)    HCT 27.3 (*)    MCV 109.2 (*)    MCH 35.6 (*)    All other components within normal limits  CBG MONITORING, ED - Abnormal; Notable for the following components:   Glucose-Capillary 102 (*)    All other components within normal limits  COMPREHENSIVE METABOLIC PANEL WITH GFR  URINALYSIS, ROUTINE W REFLEX MICROSCOPIC    EKG: None  Radiology: No results found.  {Document cardiac monitor, telemetry assessment procedure when appropriate:32947} Procedures   Medications Ordered in the ED - No data to display    {Click here for ABCD2, HEART and other calculators REFRESH Note before signing:1}                              Medical Decision Making Amount and/or Complexity of Data Reviewed Labs: ordered.  Risk Prescription drug management.   Exacerbation of low back pain.  Concern for acetaminophen  overdose.  He is not symptomatic from acetaminophen  overdose-no nausea or vomiting.  I have reviewed his laboratory tests, and my interpretation is normal comprehensive metabolic panel including normal transaminases, stable anemia, normal urinalysis.  I have discussed case with poison control and  they state if acetaminophen  level is not elevated, no treatment is needed for acetaminophen  overdose.  Acetaminophen  level is pending.  I have ordered hydromorphone  intravenously and methocarbamol  orally for pain.  I do note ED visit on 03/13/2024 for low back pain which was treated with acetaminophen  and fentanyl  and lidocaine  patch.  Patient states he did feel better for about 4 hours following that.  He was given a prescription for lidocaine  patch on discharge.  CT of abdomen and pelvis on 11/12/2023 showed no evidence of bone metastases.  His back pain appears to be purely musculoskeletal and discogenic and not related to cancer metastases.  {Document critical care time when appropriate  Document review of labs and  clinical decision tools ie [id number], etc  Document your independent review of radiology images and any outside records  Document your discussion with family members, caretakers and with consultants  Document social determinants of health affecting pt's care  Document your decision making why or why not admission, treatments were needed:32947:::1}   Final diagnoses:  None    ED Discharge Orders     None

## 2024-03-16 ENCOUNTER — Other Ambulatory Visit (HOSPITAL_COMMUNITY): Payer: Self-pay

## 2024-03-16 LAB — ACETAMINOPHEN LEVEL: Acetaminophen (Tylenol), Serum: <10 ug/mL — ABNORMAL LOW (ref 10–30)

## 2024-03-16 MED ORDER — METHOCARBAMOL 500 MG PO TABS
500.0000 mg | ORAL_TABLET | Freq: Four times a day (QID) | ORAL | 0 refills | Status: AC | PRN
  Filled 2024-03-16: qty 60, 15d supply, fill #0
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
## 2024-03-16 NOTE — Discharge Instructions (Addendum)
 Keep your appointment with the pain management clinic.  Continue taking your oxycodone -acetaminophen  as prescribed.  You may add ibuprofen  as needed.  Ibuprofen  works on pain in a different way than oxycodone -acetaminophen , and the combination will give you better pain relief and you get from taking other medication by itself.  You should not take more than 8 acetaminophen  tablets a day.  Your oxycodone -acetaminophen  pills give you the equivalent of 2 tablets of acetaminophen , so do not take more than 6 acetaminophen  tablets in addition to that.  You may apply ice to your lower back.  Ice should be applied for 30 minutes at a time, 4 times a day.  Take the muscle relaxer as needed.

## 2024-03-17 ENCOUNTER — Encounter: Payer: Self-pay | Admitting: Family Medicine

## 2024-03-17 ENCOUNTER — Other Ambulatory Visit: Payer: Self-pay

## 2024-03-18 ENCOUNTER — Encounter: Payer: Self-pay | Admitting: Hematology

## 2024-03-19 ENCOUNTER — Inpatient Hospital Stay: Attending: Oncology

## 2024-03-19 ENCOUNTER — Other Ambulatory Visit (HOSPITAL_COMMUNITY): Payer: Self-pay

## 2024-03-19 ENCOUNTER — Other Ambulatory Visit: Payer: Self-pay

## 2024-03-19 ENCOUNTER — Inpatient Hospital Stay

## 2024-03-22 ENCOUNTER — Encounter: Payer: Self-pay | Admitting: Hematology

## 2024-03-22 ENCOUNTER — Inpatient Hospital Stay: Attending: Oncology

## 2024-03-22 ENCOUNTER — Inpatient Hospital Stay

## 2024-03-22 VITALS — BP 173/89 | HR 67 | Temp 98.8°F | Resp 18 | Ht 73.0 in | Wt 145.5 lb

## 2024-03-22 DIAGNOSIS — Z8249 Family history of ischemic heart disease and other diseases of the circulatory system: Secondary | ICD-10-CM | POA: Diagnosis not present

## 2024-03-22 DIAGNOSIS — Z8042 Family history of malignant neoplasm of prostate: Secondary | ICD-10-CM | POA: Insufficient documentation

## 2024-03-22 DIAGNOSIS — Z85820 Personal history of malignant melanoma of skin: Secondary | ICD-10-CM | POA: Diagnosis not present

## 2024-03-22 DIAGNOSIS — Z79899 Other long term (current) drug therapy: Secondary | ICD-10-CM | POA: Insufficient documentation

## 2024-03-22 DIAGNOSIS — C9002 Multiple myeloma in relapse: Secondary | ICD-10-CM | POA: Insufficient documentation

## 2024-03-22 DIAGNOSIS — I10 Essential (primary) hypertension: Secondary | ICD-10-CM | POA: Diagnosis not present

## 2024-03-22 DIAGNOSIS — Z79891 Long term (current) use of opiate analgesic: Secondary | ICD-10-CM | POA: Insufficient documentation

## 2024-03-22 DIAGNOSIS — Z9049 Acquired absence of other specified parts of digestive tract: Secondary | ICD-10-CM | POA: Diagnosis not present

## 2024-03-22 DIAGNOSIS — M549 Dorsalgia, unspecified: Secondary | ICD-10-CM | POA: Insufficient documentation

## 2024-03-22 DIAGNOSIS — Z95828 Presence of other vascular implants and grafts: Secondary | ICD-10-CM

## 2024-03-22 DIAGNOSIS — Z7969 Long term (current) use of other immunomodulators and immunosuppressants: Secondary | ICD-10-CM | POA: Insufficient documentation

## 2024-03-22 DIAGNOSIS — C9001 Multiple myeloma in remission: Secondary | ICD-10-CM

## 2024-03-22 DIAGNOSIS — Z5112 Encounter for antineoplastic immunotherapy: Secondary | ICD-10-CM | POA: Diagnosis not present

## 2024-03-22 DIAGNOSIS — D649 Anemia, unspecified: Secondary | ICD-10-CM | POA: Diagnosis not present

## 2024-03-22 DIAGNOSIS — Z8701 Personal history of pneumonia (recurrent): Secondary | ICD-10-CM | POA: Insufficient documentation

## 2024-03-22 DIAGNOSIS — C61 Malignant neoplasm of prostate: Secondary | ICD-10-CM | POA: Diagnosis not present

## 2024-03-22 DIAGNOSIS — Z87891 Personal history of nicotine dependence: Secondary | ICD-10-CM | POA: Insufficient documentation

## 2024-03-22 DIAGNOSIS — Z79620 Long term (current) use of immunosuppressive biologic: Secondary | ICD-10-CM | POA: Insufficient documentation

## 2024-03-22 DIAGNOSIS — Z8616 Personal history of COVID-19: Secondary | ICD-10-CM | POA: Diagnosis not present

## 2024-03-22 DIAGNOSIS — Z886 Allergy status to analgesic agent status: Secondary | ICD-10-CM | POA: Diagnosis not present

## 2024-03-22 DIAGNOSIS — Z9079 Acquired absence of other genital organ(s): Secondary | ICD-10-CM | POA: Diagnosis not present

## 2024-03-22 LAB — CBC WITH DIFFERENTIAL (CANCER CENTER ONLY)
Abs Immature Granulocytes: 0.01 K/uL (ref 0.00–0.07)
Basophils Absolute: 0 K/uL (ref 0.0–0.1)
Basophils Relative: 0 %
Eosinophils Absolute: 0 K/uL (ref 0.0–0.5)
Eosinophils Relative: 0 %
HCT: 28.2 % — ABNORMAL LOW (ref 39.0–52.0)
Hemoglobin: 9.6 g/dL — ABNORMAL LOW (ref 13.0–17.0)
Immature Granulocytes: 0 %
Lymphocytes Relative: 26 %
Lymphs Abs: 1.4 K/uL (ref 0.7–4.0)
MCH: 36.1 pg — ABNORMAL HIGH (ref 26.0–34.0)
MCHC: 34 g/dL (ref 30.0–36.0)
MCV: 106 fL — ABNORMAL HIGH (ref 80.0–100.0)
Monocytes Absolute: 0.3 K/uL (ref 0.1–1.0)
Monocytes Relative: 5 %
Neutro Abs: 3.7 K/uL (ref 1.7–7.7)
Neutrophils Relative %: 69 %
Platelet Count: 229 K/uL (ref 150–400)
RBC: 2.66 MIL/uL — ABNORMAL LOW (ref 4.22–5.81)
RDW: 14 % (ref 11.5–15.5)
WBC Count: 5.3 K/uL (ref 4.0–10.5)
nRBC: 0 % (ref 0.0–0.2)

## 2024-03-22 LAB — COMPREHENSIVE METABOLIC PANEL WITH GFR
ALT: 31 U/L (ref 0–44)
AST: 20 U/L (ref 15–41)
Albumin: 4.4 g/dL (ref 3.5–5.0)
Alkaline Phosphatase: 80 U/L (ref 38–126)
Anion gap: 4 — ABNORMAL LOW (ref 5–15)
BUN: 14 mg/dL (ref 8–23)
CO2: 27 mmol/L (ref 22–32)
Calcium: 9.3 mg/dL (ref 8.9–10.3)
Chloride: 107 mmol/L (ref 98–111)
Creatinine, Ser: 0.69 mg/dL (ref 0.61–1.24)
GFR, Estimated: >60 mL/min (ref >60)
Glucose, Bld: 88 mg/dL (ref 70–99)
Potassium: 4 mmol/L (ref 3.5–5.1)
Sodium: 138 mmol/L (ref 135–145)
Total Bilirubin: 0.3 mg/dL (ref 0.0–1.2)
Total Protein: 7.7 g/dL (ref 6.5–8.1)

## 2024-03-22 LAB — FERRITIN: Ferritin: 328 ng/mL (ref 24–336)

## 2024-03-22 LAB — IRON AND IRON BINDING CAPACITY (CC-WL,HP ONLY)
Iron: 61 ug/dL (ref 45–182)
Saturation Ratios: 16 % — ABNORMAL LOW (ref 17.9–39.5)
TIBC: 384 ug/dL (ref 250–450)
UIBC: 323 ug/dL (ref 117–376)

## 2024-03-22 LAB — VITAMIN B12: Vitamin B-12: 582 pg/mL (ref 180–914)

## 2024-03-22 LAB — LACTATE DEHYDROGENASE: LDH: 205 U/L — ABNORMAL HIGH (ref 98–192)

## 2024-03-22 MED ORDER — DIPHENHYDRAMINE HCL 25 MG PO CAPS
50.0000 mg | ORAL_CAPSULE | Freq: Once | ORAL | Status: AC
  Administered 2024-03-22: 50 mg via ORAL
  Filled 2024-03-22: qty 2

## 2024-03-22 MED ORDER — ACETAMINOPHEN 500 MG PO TABS
1000.0000 mg | ORAL_TABLET | Freq: Once | ORAL | Status: AC
  Administered 2024-03-22: 1000 mg via ORAL
  Filled 2024-03-22: qty 2

## 2024-03-22 MED ORDER — SODIUM CHLORIDE 0.9 % IV SOLN: INTRAVENOUS | Status: AC

## 2024-03-22 MED ORDER — SODIUM CHLORIDE 0.9% FLUSH
10.0000 mL | INTRAVENOUS | Status: DC | PRN
  Administered 2024-03-22: 10 mL

## 2024-03-22 MED ORDER — DEXTROSE 5 % IV SOLN
36.0000 mg/m2 | Freq: Once | INTRAVENOUS | Status: AC
  Administered 2024-03-22: 70 mg via INTRAVENOUS
  Filled 2024-03-22: qty 30

## 2024-03-22 MED ORDER — DEXAMETHASONE 4 MG PO TABS
8.0000 mg | ORAL_TABLET | Freq: Once | ORAL | Status: AC
  Administered 2024-03-22: 8 mg via ORAL
  Filled 2024-03-22: qty 2

## 2024-03-22 MED ORDER — DARATUMUMAB-HYALURONIDASE-FIHJ 1800-30000 MG-UT/15ML ~~LOC~~ SOLN
1800.0000 mg | Freq: Once | SUBCUTANEOUS | Status: AC
  Administered 2024-03-22: 1800 mg via SUBCUTANEOUS
  Filled 2024-03-22: qty 15

## 2024-03-22 NOTE — Patient Instructions (Signed)
 [facility] - A DEPT OF [place]  Discharge Instructions: Thank you for choosing [facility] to provide your oncology and hematology care.   If you have a lab appointment with the Cancer Center, please go directly to the Cancer Center and check in at the registration area.   Wear comfortable clothing and clothing appropriate for easy access to any Portacath or PICC line.   We strive to give you quality time with your provider. You may need to reschedule your appointment if you arrive late (15 or more minutes).  Arriving late affects you and other patients whose appointments are after yours.  Also, if you miss three or more appointments without notifying the office, you may be dismissed from the clinic at the provider's discretion.      For prescription refill requests, have your pharmacy contact our office and allow 72 hours for refills to be completed.    Today you received the following chemotherapy and/or immunotherapy agent: Carfilzomiub (Kyprolis )   To help prevent nausea and vomiting after your treatment, we encourage you to take your nausea medication as directed.  BELOW ARE SYMPTOMS THAT SHOULD BE REPORTED IMMEDIATELY: *FEVER GREATER THAN 100.4 F (38 C) OR HIGHER *CHILLS OR SWEATING *NAUSEA AND VOMITING THAT IS NOT CONTROLLED WITH YOUR NAUSEA MEDICATION *UNUSUAL SHORTNESS OF BREATH *UNUSUAL BRUISING OR BLEEDING *URINARY PROBLEMS (pain or burning when urinating, or frequent urination) *BOWEL PROBLEMS (unusual diarrhea, constipation, pain near the anus) TENDERNESS IN MOUTH AND THROAT WITH OR WITHOUT PRESENCE OF ULCERS (sore throat, sores in mouth, or a toothache) UNUSUAL RASH, SWELLING OR PAIN  UNUSUAL VAGINAL DISCHARGE OR ITCHING   Items with * indicate a potential emergency and should be followed up as soon as possible or go to the Emergency Department if any problems should occur.  Please show the CHEMOTHERAPY ALERT CARD or  IMMUNOTHERAPY ALERT CARD at check-in to the Emergency Department and triage nurse.  Should you have questions after your visit or need to cancel or reschedule your appointment, please contact [facility] - A DEPT OF [facility]  Dept: [phone]  and follow the prompts.  Office hours are 8:00 a.m. to 4:30 p.m. Monday - Friday. Please note that voicemails left after 4:00 p.m. may not be returned until the following business day.  We are closed weekends and major holidays. You have access to a nurse at all times for urgent questions. Please call the main number to the clinic Dept: [phone] and follow the prompts.   For any non-urgent questions, you may also contact your provider using MyChart. We now offer e-Visits for anyone 30 and older to request care online for non-urgent symptoms. For details visit [email or web address].   Also download the MyChart app! Go to the app store, search MyChart, open the app, select [facility], and log in with your MyChart username and password.  Carfilzomib  Injection What is this medication? CARFILZOMIB  (kar FILZ oh mib) treats multiple myeloma, a type of bone marrow cancer. It works by blocking a protein that causes cancer cells to grow and multiply. This helps to slow or stop the spread of cancer cells. This medicine may be used for other purposes; ask your health care provider or pharmacist if you have questions. COMMON BRAND NAME(S): KYPROLIS  What should I tell my care team before I take this medication? They need to know if you have any of these conditions: Heart disease History  of blood clots Irregular heartbeat Kidney disease Liver disease Lung or breathing disease An unusual or allergic reaction to carfilzomib , or other medications, foods, dyes, or preservatives If you or your partner are pregnant or trying to get pregnant Breastfeeding How should I use this medication? This medication is injected into a  vein. It is given by your care team in a hospital or clinic setting. Talk to your care team about the use of this medication in children. Special care may be needed. Overdosage: If you think you have taken too much of this medicine contact a poison control center or emergency room at once. NOTE: This medicine is only for you. Do not share this medicine with others. What if I miss a dose? Keep appointments for follow-up doses. It is important not to miss your dose. Call your care team if you are unable to keep an appointment. What may interact with this medication? Interactions are not expected. This list may not describe all possible interactions. Give your health care provider a list of all the medicines, herbs, non-prescription drugs, or dietary supplements you use. Also tell them if you smoke, drink alcohol, or use illegal drugs. Some items may interact with your medicine. What should I watch for while using this medication? Your condition will be monitored carefully while you are receiving this medication. You may need blood work while taking this medication. Check with your care team if you have severe diarrhea, nausea, and vomiting, or if you sweat a lot. The loss of too much body fluid may make it dangerous for you to take this medication. This medication may affect your coordination, reaction time, or judgment. Do not drive or operate machinery until you know how this medication affects you. Sit up or stand slowly to reduce the risk of dizzy or fainting spells. Drinking alcohol with this medication can increase the risk of these side effects. Talk to your care team if you may be pregnant. Serious birth defects can occur if you take this medication during pregnancy and for 6 months after the last dose. You will need a negative pregnancy test before starting this medication. Contraception is recommended while taking this medication and for 6 months after the last dose. Your care team can help you  find an option that works for you. If your partner can get pregnant, use a condom during sex while taking this medication and for 3 months after the last dose. Do not breastfeed while taking this medication and for 2 weeks after the last dose. This medication may cause infertility. Talk to your care team if you are concerned about your fertility. What side effects may I notice from receiving this medication? Side effects that you should report to your care team as soon as possible: Allergic reactions--skin rash, itching, hives, swelling of the face, lips, tongue, or throat Bleeding--bloody or black, tar-like stools, vomiting blood or brown material that looks like coffee grounds, red or dark brown urine, small red or purple spots on skin, unusual bruising or bleeding Blood clot--pain, swelling, or warmth in the leg, shortness of breath, chest pain Dizziness, loss of balance or coordination, confusion or trouble speaking Heart attack--pain or tightness in the chest, shoulders, arms, or jaw, nausea, shortness of breath, cold or clammy skin, feeling faint or lightheaded Heart failure--shortness of breath, swelling of the ankles, feet, or hands, sudden weight gain, unusual weakness or fatigue Heart rhythm changes--fast or irregular heartbeat, dizziness, feeling faint or lightheaded, chest pain, trouble breathing Increase in blood  pressure Infection--fever, chills, cough, sore throat, wounds that don't heal, pain or trouble when passing urine, general feeling of discomfort or being unwell Infusion reactions--chest pain, shortness of breath or trouble breathing, feeling faint or lightheaded Kidney injury--decrease in the amount of urine, swelling of the ankles, hands, or feet Liver injury--right upper belly pain, loss of appetite, nausea, light-colored stool, dark yellow or brown urine, yellowing skin or eyes, unusual weakness or fatigue Lung injury--shortness of breath or trouble breathing, cough,  spitting up blood, chest pain, fever Pulmonary hypertension--shortness of breath, chest pain, fast or irregular heartbeat, feeling faint or lightheaded, fatigue, swelling of the ankles or feet Stomach pain, bloody diarrhea, pale skin, unusual weakness or fatigue, decrease in the amount of urine, which may be signs of hemolytic uremic syndrome Sudden and severe headache, confusion, change in vision, seizures, which may be signs of posterior reversible encephalopathy syndrome (PRES) TTP--purple spots on the skin or inside the mouth, pale skin, yellowing skin or eyes, unusual weakness or fatigue, fever, fast or irregular heartbeat, confusion, change in vision, trouble speaking, trouble walking Tumor lysis syndrome (TLS)--nausea, vomiting, diarrhea, decrease in the amount of urine, dark urine, unusual weakness or fatigue, confusion, muscle pain or cramps, fast or irregular heartbeat, joint pain Side effects that usually do not require medical attention (report to your care team if they continue or are bothersome): Diarrhea Fatigue Nausea Trouble sleeping This list may not describe all possible side effects. Call your doctor for medical advice about side effects. You may report side effects to FDA at 1-800-FDA-1088. Where should I keep my medication? This medication is given in a hospital or clinic. It will not be stored at home. NOTE: This sheet is a summary. It may not cover all possible information. If you have questions about this medicine, talk to your doctor, pharmacist, or health care provider.  2024 Elsevier/Gold Standard (2021-11-29 00:00:00)

## 2024-03-23 DIAGNOSIS — Z79899 Other long term (current) drug therapy: Secondary | ICD-10-CM | POA: Diagnosis not present

## 2024-03-23 DIAGNOSIS — M546 Pain in thoracic spine: Secondary | ICD-10-CM | POA: Diagnosis not present

## 2024-03-23 DIAGNOSIS — M129 Arthropathy, unspecified: Secondary | ICD-10-CM | POA: Diagnosis not present

## 2024-03-23 DIAGNOSIS — R0602 Shortness of breath: Secondary | ICD-10-CM | POA: Diagnosis not present

## 2024-03-23 DIAGNOSIS — I160 Hypertensive urgency: Secondary | ICD-10-CM | POA: Diagnosis not present

## 2024-03-23 DIAGNOSIS — E559 Vitamin D deficiency, unspecified: Secondary | ICD-10-CM | POA: Diagnosis not present

## 2024-03-23 DIAGNOSIS — M545 Low back pain, unspecified: Secondary | ICD-10-CM | POA: Diagnosis not present

## 2024-03-23 LAB — HAPTOGLOBIN: Haptoglobin: 52 mg/dL (ref 34–355)

## 2024-03-23 LAB — FOLATE RBC
Folate, Hemolysate: 471 ng/mL
Folate, RBC: 1602 ng/mL (ref >498)
Hematocrit: 29.4 % — ABNORMAL LOW (ref 37.5–51.0)

## 2024-03-25 ENCOUNTER — Other Ambulatory Visit: Payer: Self-pay

## 2024-03-25 ENCOUNTER — Other Ambulatory Visit (HOSPITAL_COMMUNITY): Payer: Self-pay

## 2024-03-25 MED ORDER — OXYCODONE-ACETAMINOPHEN 10-325 MG PO TABS
1.0000 | ORAL_TABLET | Freq: Two times a day (BID) | ORAL | 0 refills | Status: DC | PRN
  Filled 2024-03-26 – 2024-03-28 (×2): qty 60, 30d supply, fill #0
  Filled ????-??-??: fill #0

## 2024-03-26 ENCOUNTER — Inpatient Hospital Stay: Admitting: Hematology

## 2024-03-26 ENCOUNTER — Inpatient Hospital Stay

## 2024-03-26 ENCOUNTER — Other Ambulatory Visit (HOSPITAL_COMMUNITY): Payer: Self-pay

## 2024-03-26 LAB — MULTIPLE MYELOMA PANEL, SERUM
Albumin SerPl Elph-Mcnc: 4 g/dL (ref 2.9–4.4)
Albumin/Glob SerPl: 1.2 (ref 0.7–1.7)
Alpha 1: 0.3 g/dL (ref 0.0–0.4)
Alpha2 Glob SerPl Elph-Mcnc: 0.6 g/dL (ref 0.4–1.0)
B-Globulin SerPl Elph-Mcnc: 1 g/dL (ref 0.7–1.3)
Gamma Glob SerPl Elph-Mcnc: 1.7 g/dL (ref 0.4–1.8)
Globulin, Total: 3.5 g/dL (ref 2.2–3.9)
IgA: 12 mg/dL — ABNORMAL LOW (ref 61–437)
IgG (Immunoglobin G), Serum: 1946 mg/dL — ABNORMAL HIGH (ref 603–1613)
IgM (Immunoglobulin M), Srm: 8 mg/dL — ABNORMAL LOW (ref 15–143)
M Protein SerPl Elph-Mcnc: 1.5 g/dL — ABNORMAL HIGH
Total Protein ELP: 7.5 g/dL (ref 6.0–8.5)

## 2024-03-28 ENCOUNTER — Other Ambulatory Visit (HOSPITAL_COMMUNITY): Payer: Self-pay

## 2024-03-29 DIAGNOSIS — M5416 Radiculopathy, lumbar region: Secondary | ICD-10-CM | POA: Diagnosis not present

## 2024-03-29 DIAGNOSIS — K219 Gastro-esophageal reflux disease without esophagitis: Secondary | ICD-10-CM | POA: Diagnosis not present

## 2024-03-29 DIAGNOSIS — C9000 Multiple myeloma not having achieved remission: Secondary | ICD-10-CM | POA: Diagnosis not present

## 2024-03-29 DIAGNOSIS — I10 Essential (primary) hypertension: Secondary | ICD-10-CM | POA: Diagnosis not present
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
## 2024-04-02 ENCOUNTER — Inpatient Hospital Stay

## 2024-04-02 ENCOUNTER — Inpatient Hospital Stay (HOSPITAL_BASED_OUTPATIENT_CLINIC_OR_DEPARTMENT_OTHER): Admitting: Physician Assistant

## 2024-04-02 ENCOUNTER — Ambulatory Visit

## 2024-04-02 ENCOUNTER — Other Ambulatory Visit

## 2024-04-02 VITALS — BP 151/104 | HR 67 | Temp 98.1°F | Resp 17 | Ht 73.0 in | Wt 145.0 lb

## 2024-04-02 DIAGNOSIS — I10 Essential (primary) hypertension: Secondary | ICD-10-CM | POA: Diagnosis not present

## 2024-04-02 DIAGNOSIS — Z886 Allergy status to analgesic agent status: Secondary | ICD-10-CM | POA: Diagnosis not present

## 2024-04-02 DIAGNOSIS — Z8249 Family history of ischemic heart disease and other diseases of the circulatory system: Secondary | ICD-10-CM | POA: Diagnosis not present

## 2024-04-02 DIAGNOSIS — Z79899 Other long term (current) drug therapy: Secondary | ICD-10-CM | POA: Diagnosis not present

## 2024-04-02 DIAGNOSIS — M549 Dorsalgia, unspecified: Secondary | ICD-10-CM | POA: Diagnosis not present

## 2024-04-02 DIAGNOSIS — C9002 Multiple myeloma in relapse: Secondary | ICD-10-CM | POA: Diagnosis not present

## 2024-04-02 DIAGNOSIS — C9001 Multiple myeloma in remission: Secondary | ICD-10-CM

## 2024-04-02 DIAGNOSIS — Z5112 Encounter for antineoplastic immunotherapy: Secondary | ICD-10-CM | POA: Diagnosis not present

## 2024-04-02 DIAGNOSIS — Z79891 Long term (current) use of opiate analgesic: Secondary | ICD-10-CM | POA: Diagnosis not present

## 2024-04-02 DIAGNOSIS — Z85820 Personal history of malignant melanoma of skin: Secondary | ICD-10-CM | POA: Diagnosis not present

## 2024-04-02 DIAGNOSIS — Z79620 Long term (current) use of immunosuppressive biologic: Secondary | ICD-10-CM | POA: Diagnosis not present

## 2024-04-02 DIAGNOSIS — Z9049 Acquired absence of other specified parts of digestive tract: Secondary | ICD-10-CM | POA: Diagnosis not present

## 2024-04-02 DIAGNOSIS — Z7969 Long term (current) use of other immunomodulators and immunosuppressants: Secondary | ICD-10-CM | POA: Diagnosis not present

## 2024-04-02 DIAGNOSIS — D649 Anemia, unspecified: Secondary | ICD-10-CM | POA: Diagnosis not present

## 2024-04-02 DIAGNOSIS — Z87891 Personal history of nicotine dependence: Secondary | ICD-10-CM | POA: Diagnosis not present

## 2024-04-02 LAB — CBC WITH DIFFERENTIAL (CANCER CENTER ONLY)
Abs Immature Granulocytes: 0.02 K/uL (ref 0.00–0.07)
Basophils Absolute: 0 K/uL (ref 0.0–0.1)
Basophils Relative: 0 %
Eosinophils Absolute: 0 K/uL (ref 0.0–0.5)
Eosinophils Relative: 0 %
HCT: 29.5 % — ABNORMAL LOW (ref 39.0–52.0)
Hemoglobin: 10 g/dL — ABNORMAL LOW (ref 13.0–17.0)
Immature Granulocytes: 0 %
Lymphocytes Relative: 26 %
Lymphs Abs: 1.5 K/uL (ref 0.7–4.0)
MCH: 35.6 pg — ABNORMAL HIGH (ref 26.0–34.0)
MCHC: 33.9 g/dL (ref 30.0–36.0)
MCV: 105 fL — ABNORMAL HIGH (ref 80.0–100.0)
Monocytes Absolute: 0.3 K/uL (ref 0.1–1.0)
Monocytes Relative: 5 %
Neutro Abs: 4 K/uL (ref 1.7–7.7)
Neutrophils Relative %: 69 %
Platelet Count: 188 K/uL (ref 150–400)
RBC: 2.81 MIL/uL — ABNORMAL LOW (ref 4.22–5.81)
RDW: 12.7 % (ref 11.5–15.5)
WBC Count: 5.9 K/uL (ref 4.0–10.5)
nRBC: 0 % (ref 0.0–0.2)

## 2024-04-02 LAB — COMPREHENSIVE METABOLIC PANEL WITH GFR
ALT: 12 U/L (ref 0–44)
AST: 14 U/L — ABNORMAL LOW (ref 15–41)
Albumin: 4.1 g/dL (ref 3.5–5.0)
Alkaline Phosphatase: 79 U/L (ref 38–126)
Anion gap: 3 — ABNORMAL LOW (ref 5–15)
BUN: 17 mg/dL (ref 8–23)
CO2: 28 mmol/L (ref 22–32)
Calcium: 8.6 mg/dL — ABNORMAL LOW (ref 8.9–10.3)
Chloride: 106 mmol/L (ref 98–111)
Creatinine, Ser: 0.79 mg/dL (ref 0.61–1.24)
GFR, Estimated: >60 mL/min (ref >60)
Glucose, Bld: 80 mg/dL (ref 70–99)
Potassium: 4.1 mmol/L (ref 3.5–5.1)
Sodium: 137 mmol/L (ref 135–145)
Total Bilirubin: 0.3 mg/dL (ref 0.0–1.2)
Total Protein: 7.6 g/dL (ref 6.5–8.1)

## 2024-04-02 MED ORDER — ACETAMINOPHEN 500 MG PO TABS
1000.0000 mg | ORAL_TABLET | Freq: Once | ORAL | Status: AC
  Administered 2024-04-02: 1000 mg via ORAL
  Filled 2024-04-02: qty 2

## 2024-04-02 MED ORDER — DEXTROSE 5 % IV SOLN
36.0000 mg/m2 | Freq: Once | INTRAVENOUS | Status: AC
  Administered 2024-04-02: 70 mg via INTRAVENOUS
  Filled 2024-04-02: qty 30

## 2024-04-02 MED ORDER — SODIUM CHLORIDE 0.9 % IV SOLN: INTRAVENOUS | Status: AC

## 2024-04-02 MED ORDER — DEXAMETHASONE 4 MG PO TABS
8.0000 mg | ORAL_TABLET | Freq: Once | ORAL | Status: AC
  Administered 2024-04-02: 8 mg via ORAL
  Filled 2024-04-02: qty 2

## 2024-04-02 MED ORDER — DIPHENHYDRAMINE HCL 25 MG PO CAPS
50.0000 mg | ORAL_CAPSULE | Freq: Once | ORAL | Status: AC
  Administered 2024-04-02: 50 mg via ORAL
  Filled 2024-04-02: qty 2

## 2024-04-02 NOTE — Patient Instructions (Signed)
 [facility] - A DEPT OF [facility]  Discharge Instructions: Thank you for choosing [facility] to provide your oncology and hematology care.   If you have a lab appointment with the Cancer Center, please go directly to the Cancer Center and check in at the registration area.   Wear comfortable clothing and clothing appropriate for easy access to any Portacath or PICC line.   We strive to give you quality time with your provider. You may need to reschedule your appointment if you arrive late (15 or more minutes).  Arriving late affects you and other patients whose appointments are after yours.  Also, if you miss three or more appointments without notifying the office, you may be dismissed from the clinic at the provider's discretion.      For prescription refill requests, have your pharmacy contact our office and allow 72 hours for refills to be completed.    Today you received the following chemotherapy and/or immunotherapy agents: carfilzomib      To help prevent nausea and vomiting after your treatment, we encourage you to take your nausea medication as directed.  BELOW ARE SYMPTOMS THAT SHOULD BE REPORTED IMMEDIATELY: *FEVER GREATER THAN 100.4 F (38 C) OR HIGHER *CHILLS OR SWEATING *NAUSEA AND VOMITING THAT IS NOT CONTROLLED WITH YOUR NAUSEA MEDICATION *UNUSUAL SHORTNESS OF BREATH *UNUSUAL BRUISING OR BLEEDING *URINARY PROBLEMS (pain or burning when urinating, or frequent urination) *BOWEL PROBLEMS (unusual diarrhea, constipation, pain near the anus) TENDERNESS IN MOUTH AND THROAT WITH OR WITHOUT PRESENCE OF ULCERS (sore throat, sores in mouth, or a toothache) UNUSUAL RASH, SWELLING OR PAIN  UNUSUAL VAGINAL DISCHARGE OR ITCHING   Items with * indicate a potential emergency and should be followed up as soon as possible or go to the Emergency Department if any problems should occur.  Please show the CHEMOTHERAPY ALERT CARD or  IMMUNOTHERAPY ALERT CARD at check-in to the Emergency Department and triage nurse.  Should you have questions after your visit or need to cancel or reschedule your appointment, please contact [facility] - A DEPT OF [facility]  Dept: [phone]  and follow the prompts.  Office hours are 8:00 a.m. to 4:30 p.m. Monday - Friday. Please note that voicemails left after 4:00 p.m. may not be returned until the following business day.  We are closed weekends and major holidays. You have access to a nurse at all times for urgent questions. Please call the main number to the clinic Dept: [phone] and follow the prompts.   For any non-urgent questions, you may also contact your provider using MyChart. We now offer e-Visits for anyone 73 and older to request care online for non-urgent symptoms. For details visit [email or web address].   Also download the MyChart app! Go to the app store, search "MyChart", open the app, select [place], and log in with your MyChart username and password.

## 2024-04-06 ENCOUNTER — Other Ambulatory Visit: Payer: Self-pay

## 2024-04-07 ENCOUNTER — Other Ambulatory Visit (HOSPITAL_COMMUNITY): Payer: Self-pay

## 2024-04-08 ENCOUNTER — Other Ambulatory Visit: Payer: Self-pay | Admitting: Hematology

## 2024-04-08 ENCOUNTER — Other Ambulatory Visit (HOSPITAL_COMMUNITY): Payer: Self-pay

## 2024-04-08 ENCOUNTER — Other Ambulatory Visit: Payer: Self-pay

## 2024-04-08 MED ORDER — LIDOCAINE-PRILOCAINE 2.5-2.5 % EX CREA
TOPICAL_CREAM | CUTANEOUS | 2 refills | Status: AC
  Filled 2024-04-08: qty 30, 30d supply, fill #0

## 2024-04-09 ENCOUNTER — Inpatient Hospital Stay

## 2024-04-09 VITALS — BP 154/82 | HR 70 | Temp 98.5°F | Resp 16 | Wt 144.5 lb

## 2024-04-09 DIAGNOSIS — C9002 Multiple myeloma in relapse: Secondary | ICD-10-CM | POA: Diagnosis not present

## 2024-04-09 DIAGNOSIS — C9001 Multiple myeloma in remission: Secondary | ICD-10-CM

## 2024-04-09 DIAGNOSIS — Z85820 Personal history of malignant melanoma of skin: Secondary | ICD-10-CM | POA: Diagnosis not present

## 2024-04-09 DIAGNOSIS — Z79899 Other long term (current) drug therapy: Secondary | ICD-10-CM | POA: Diagnosis not present

## 2024-04-09 DIAGNOSIS — Z79891 Long term (current) use of opiate analgesic: Secondary | ICD-10-CM | POA: Diagnosis not present

## 2024-04-09 DIAGNOSIS — Z87891 Personal history of nicotine dependence: Secondary | ICD-10-CM | POA: Diagnosis not present

## 2024-04-09 DIAGNOSIS — Z886 Allergy status to analgesic agent status: Secondary | ICD-10-CM | POA: Diagnosis not present

## 2024-04-09 DIAGNOSIS — Z79620 Long term (current) use of immunosuppressive biologic: Secondary | ICD-10-CM | POA: Diagnosis not present

## 2024-04-09 DIAGNOSIS — Z5112 Encounter for antineoplastic immunotherapy: Secondary | ICD-10-CM | POA: Diagnosis not present

## 2024-04-09 DIAGNOSIS — M549 Dorsalgia, unspecified: Secondary | ICD-10-CM | POA: Diagnosis not present

## 2024-04-09 DIAGNOSIS — Z8249 Family history of ischemic heart disease and other diseases of the circulatory system: Secondary | ICD-10-CM | POA: Diagnosis not present

## 2024-04-09 DIAGNOSIS — I10 Essential (primary) hypertension: Secondary | ICD-10-CM | POA: Diagnosis not present

## 2024-04-09 DIAGNOSIS — D649 Anemia, unspecified: Secondary | ICD-10-CM | POA: Diagnosis not present

## 2024-04-09 DIAGNOSIS — Z9049 Acquired absence of other specified parts of digestive tract: Secondary | ICD-10-CM | POA: Diagnosis not present

## 2024-04-09 DIAGNOSIS — Z7969 Long term (current) use of other immunomodulators and immunosuppressants: Secondary | ICD-10-CM | POA: Diagnosis not present

## 2024-04-09 LAB — CBC WITH DIFFERENTIAL (CANCER CENTER ONLY)
Abs Immature Granulocytes: 0.01 K/uL (ref 0.00–0.07)
Basophils Absolute: 0 K/uL (ref 0.0–0.1)
Basophils Relative: 0 %
Eosinophils Absolute: 0 K/uL (ref 0.0–0.5)
Eosinophils Relative: 0 %
HCT: 31.9 % — ABNORMAL LOW (ref 39.0–52.0)
Hemoglobin: 11 g/dL — ABNORMAL LOW (ref 13.0–17.0)
Immature Granulocytes: 0 %
Lymphocytes Relative: 37 %
Lymphs Abs: 1.7 K/uL (ref 0.7–4.0)
MCH: 35.4 pg — ABNORMAL HIGH (ref 26.0–34.0)
MCHC: 34.5 g/dL (ref 30.0–36.0)
MCV: 102.6 fL — ABNORMAL HIGH (ref 80.0–100.0)
Monocytes Absolute: 0.5 K/uL (ref 0.1–1.0)
Monocytes Relative: 11 %
Neutro Abs: 2.3 K/uL (ref 1.7–7.7)
Neutrophils Relative %: 52 %
Platelet Count: 211 K/uL (ref 150–400)
RBC: 3.11 MIL/uL — ABNORMAL LOW (ref 4.22–5.81)
RDW: 12.4 % (ref 11.5–15.5)
WBC Count: 4.5 K/uL (ref 4.0–10.5)
nRBC: 0 % (ref 0.0–0.2)

## 2024-04-09 LAB — COMPREHENSIVE METABOLIC PANEL WITH GFR
ALT: 11 U/L (ref 0–44)
AST: 14 U/L — ABNORMAL LOW (ref 15–41)
Albumin: 4.2 g/dL (ref 3.5–5.0)
Alkaline Phosphatase: 91 U/L (ref 38–126)
Anion gap: 4 — ABNORMAL LOW (ref 5–15)
BUN: 13 mg/dL (ref 8–23)
CO2: 27 mmol/L (ref 22–32)
Calcium: 8.8 mg/dL — ABNORMAL LOW (ref 8.9–10.3)
Chloride: 105 mmol/L (ref 98–111)
Creatinine, Ser: 0.8 mg/dL (ref 0.61–1.24)
GFR, Estimated: >60 mL/min (ref >60)
Glucose, Bld: 90 mg/dL (ref 70–99)
Potassium: 4.6 mmol/L (ref 3.5–5.1)
Sodium: 136 mmol/L (ref 135–145)
Total Bilirubin: 0.3 mg/dL (ref 0.0–1.2)
Total Protein: 7.8 g/dL (ref 6.5–8.1)

## 2024-04-09 MED ORDER — SODIUM CHLORIDE 0.9 % IV SOLN: INTRAVENOUS | Status: AC

## 2024-04-09 MED ORDER — ACETAMINOPHEN 500 MG PO TABS
1000.0000 mg | ORAL_TABLET | Freq: Once | ORAL | Status: AC
  Administered 2024-04-09: 1000 mg via ORAL
  Filled 2024-04-09: qty 2

## 2024-04-09 MED ORDER — DIPHENHYDRAMINE HCL 25 MG PO CAPS
50.0000 mg | ORAL_CAPSULE | Freq: Once | ORAL | Status: AC
  Administered 2024-04-09: 50 mg via ORAL
  Filled 2024-04-09: qty 2

## 2024-04-09 MED ORDER — DARATUMUMAB-HYALURONIDASE-FIHJ 1800-30000 MG-UT/15ML ~~LOC~~ SOLN
1800.0000 mg | Freq: Once | SUBCUTANEOUS | Status: AC
  Administered 2024-04-09: 1800 mg via SUBCUTANEOUS
  Filled 2024-04-09: qty 15

## 2024-04-09 MED ORDER — DEXAMETHASONE 4 MG PO TABS
8.0000 mg | ORAL_TABLET | Freq: Once | ORAL | Status: AC
  Administered 2024-04-09: 8 mg via ORAL
  Filled 2024-04-09: qty 2

## 2024-04-09 MED ORDER — DEXTROSE 5 % IV SOLN
36.0000 mg/m2 | Freq: Once | INTRAVENOUS | Status: AC
  Administered 2024-04-09: 70 mg via INTRAVENOUS
  Filled 2024-04-09: qty 30

## 2024-04-09 NOTE — Patient Instructions (Signed)
 [facility] - A DEPT OF [facility]  Discharge Instructions: Thank you for choosing [facility] to provide your oncology and hematology care.   If you have a lab appointment with the Cancer Center, please go directly to the Cancer Center and check in at the registration area.   Wear comfortable clothing and clothing appropriate for easy access to any Portacath or PICC line.   We strive to give you quality time with your provider. You may need to reschedule your appointment if you arrive late (15 or more minutes).  Arriving late affects you and other patients whose appointments are after yours.  Also, if you miss three or more appointments without notifying the office, you may be dismissed from the clinic at the provider's discretion.      For prescription refill requests, have your pharmacy contact our office and allow 72 hours for refills to be completed.    Today you received the following chemotherapy and/or immunotherapy agents: Carfilzomib  & Darzalex  faspro    To help prevent nausea and vomiting after your treatment, we encourage you to take your nausea medication as directed.  BELOW ARE SYMPTOMS THAT SHOULD BE REPORTED IMMEDIATELY: *FEVER GREATER THAN 100.4 F (38 C) OR HIGHER *CHILLS OR SWEATING *NAUSEA AND VOMITING THAT IS NOT CONTROLLED WITH YOUR NAUSEA MEDICATION *UNUSUAL SHORTNESS OF BREATH *UNUSUAL BRUISING OR BLEEDING *URINARY PROBLEMS (pain or burning when urinating, or frequent urination) *BOWEL PROBLEMS (unusual diarrhea, constipation, pain near the anus) TENDERNESS IN MOUTH AND THROAT WITH OR WITHOUT PRESENCE OF ULCERS (sore throat, sores in mouth, or a toothache) UNUSUAL RASH, SWELLING OR PAIN  UNUSUAL VAGINAL DISCHARGE OR ITCHING   Items with * indicate a potential emergency and should be followed up as soon as possible or go to the Emergency Department if any problems should occur.  Please show the CHEMOTHERAPY ALERT CARD  or IMMUNOTHERAPY ALERT CARD at check-in to the Emergency Department and triage nurse.  Should you have questions after your visit or need to cancel or reschedule your appointment, please contact [facility]  Dept: [phone]  and follow the prompts.  Office hours are 8:00 a.m. to 4:30 p.m. Monday - Friday. Please note that voicemails left after 4:00 p.m. may not be returned until the following business day.  We are closed weekends and major holidays. You have access to a nurse at all times for urgent questions. Please call the main number to the clinic Dept: [phone] and follow the prompts.   For any non-urgent questions, you may also contact your provider using MyChart. We now offer e-Visits for anyone 68 and older to request care online for non-urgent symptoms. For details visit [email or web address].   Also download the MyChart app! Go to the app store, search MyChart, open the app, select , and log in with your MyChart username and password.

## 2024-04-10 ENCOUNTER — Other Ambulatory Visit (HOSPITAL_COMMUNITY): Payer: Self-pay

## 2024-04-11 ENCOUNTER — Other Ambulatory Visit: Payer: Self-pay

## 2024-04-12 ENCOUNTER — Other Ambulatory Visit: Payer: Self-pay

## 2024-04-13 ENCOUNTER — Other Ambulatory Visit (HOSPITAL_BASED_OUTPATIENT_CLINIC_OR_DEPARTMENT_OTHER): Payer: Self-pay

## 2024-04-22 ENCOUNTER — Other Ambulatory Visit (HOSPITAL_COMMUNITY): Payer: Self-pay

## 2024-04-22 MED ORDER — OXYCODONE-ACETAMINOPHEN 10-325 MG PO TABS
1.0000 | ORAL_TABLET | Freq: Two times a day (BID) | ORAL | 0 refills | Status: DC
  Filled 2024-04-26 – 2024-04-27 (×2): qty 60, 30d supply, fill #0

## 2024-04-23 ENCOUNTER — Encounter: Payer: Self-pay | Admitting: Hematology

## 2024-04-23 ENCOUNTER — Inpatient Hospital Stay

## 2024-04-23 ENCOUNTER — Inpatient Hospital Stay: Attending: Oncology

## 2024-04-23 VITALS — BP 182/96 | HR 72 | Temp 98.6°F | Resp 16 | Wt 144.2 lb

## 2024-04-23 DIAGNOSIS — Z5112 Encounter for antineoplastic immunotherapy: Secondary | ICD-10-CM | POA: Insufficient documentation

## 2024-04-23 DIAGNOSIS — Z79620 Long term (current) use of immunosuppressive biologic: Secondary | ICD-10-CM | POA: Insufficient documentation

## 2024-04-23 DIAGNOSIS — C9002 Multiple myeloma in relapse: Secondary | ICD-10-CM | POA: Diagnosis present

## 2024-04-23 DIAGNOSIS — C9001 Multiple myeloma in remission: Secondary | ICD-10-CM

## 2024-04-23 LAB — CBC WITH DIFFERENTIAL (CANCER CENTER ONLY)
Abs Immature Granulocytes: 0.01 K/uL (ref 0.00–0.07)
Basophils Absolute: 0 K/uL (ref 0.0–0.1)
Basophils Relative: 1 %
Eosinophils Absolute: 0 K/uL (ref 0.0–0.5)
Eosinophils Relative: 0 %
HCT: 32.4 % — ABNORMAL LOW (ref 39.0–52.0)
Hemoglobin: 11.2 g/dL — ABNORMAL LOW (ref 13.0–17.0)
Immature Granulocytes: 0 %
Lymphocytes Relative: 29 %
Lymphs Abs: 1.8 K/uL (ref 0.7–4.0)
MCH: 34.7 pg — ABNORMAL HIGH (ref 26.0–34.0)
MCHC: 34.6 g/dL (ref 30.0–36.0)
MCV: 100.3 fL — ABNORMAL HIGH (ref 80.0–100.0)
Monocytes Absolute: 0.4 K/uL (ref 0.1–1.0)
Monocytes Relative: 7 %
Neutro Abs: 3.9 K/uL (ref 1.7–7.7)
Neutrophils Relative %: 63 %
Platelet Count: 208 K/uL (ref 150–400)
RBC: 3.23 MIL/uL — ABNORMAL LOW (ref 4.22–5.81)
RDW: 12 % (ref 11.5–15.5)
WBC Count: 6.1 K/uL (ref 4.0–10.5)
nRBC: 0 % (ref 0.0–0.2)

## 2024-04-23 LAB — COMPREHENSIVE METABOLIC PANEL WITH GFR
ALT: 11 U/L (ref 0–44)
AST: 17 U/L (ref 15–41)
Albumin: 4.3 g/dL (ref 3.5–5.0)
Alkaline Phosphatase: 84 U/L (ref 38–126)
Anion gap: 3 — ABNORMAL LOW (ref 5–15)
BUN: 14 mg/dL (ref 8–23)
CO2: 28 mmol/L (ref 22–32)
Calcium: 9.6 mg/dL (ref 8.9–10.3)
Chloride: 103 mmol/L (ref 98–111)
Creatinine, Ser: 0.8 mg/dL (ref 0.61–1.24)
GFR, Estimated: >60 mL/min (ref >60)
Glucose, Bld: 82 mg/dL (ref 70–99)
Potassium: 4.2 mmol/L (ref 3.5–5.1)
Sodium: 134 mmol/L — ABNORMAL LOW (ref 135–145)
Total Bilirubin: 0.4 mg/dL (ref 0.0–1.2)
Total Protein: 7.8 g/dL (ref 6.5–8.1)

## 2024-04-23 MED ORDER — DEXAMETHASONE 4 MG PO TABS
8.0000 mg | ORAL_TABLET | Freq: Once | ORAL | Status: AC
  Administered 2024-04-23: 8 mg via ORAL
  Filled 2024-04-23: qty 2

## 2024-04-23 MED ORDER — DIPHENHYDRAMINE HCL 25 MG PO CAPS
50.0000 mg | ORAL_CAPSULE | Freq: Once | ORAL | Status: AC
  Administered 2024-04-23: 50 mg via ORAL
  Filled 2024-04-23: qty 2

## 2024-04-23 MED ORDER — DARATUMUMAB-HYALURONIDASE-FIHJ 1800-30000 MG-UT/15ML ~~LOC~~ SOLN
1800.0000 mg | Freq: Once | SUBCUTANEOUS | Status: AC
  Administered 2024-04-23: 1800 mg via SUBCUTANEOUS
  Filled 2024-04-23: qty 15

## 2024-04-23 MED ORDER — SODIUM CHLORIDE 0.9 % IV SOLN: INTRAVENOUS | Status: AC

## 2024-04-23 MED ORDER — ACETAMINOPHEN 500 MG PO TABS
1000.0000 mg | ORAL_TABLET | Freq: Once | ORAL | Status: AC
  Administered 2024-04-23: 1000 mg via ORAL
  Filled 2024-04-23: qty 2

## 2024-04-23 MED ORDER — DEXTROSE 5 % IV SOLN
36.0000 mg/m2 | Freq: Once | INTRAVENOUS | Status: AC
  Administered 2024-04-23: 70 mg via INTRAVENOUS
  Filled 2024-04-23: qty 30

## 2024-04-23 NOTE — Patient Instructions (Signed)
 [facility] - A DEPT OF [facility]  Discharge Instructions: Thank you for choosing [facility] to provide your oncology and hematology care.   If you have a lab appointment with the Cancer Center, please go directly to the Cancer Center and check in at the registration area.   Wear comfortable clothing and clothing appropriate for easy access to any Portacath or PICC line.   We strive to give you quality time with your provider. You may need to reschedule your appointment if you arrive late (15 or more minutes).  Arriving late affects you and other patients whose appointments are after yours.  Also, if you miss three or more appointments without notifying the office, you may be dismissed from the clinic at the provider's discretion.      For prescription refill requests, have your pharmacy contact our office and allow 72 hours for refills to be completed.    Today you received the following chemotherapy and/or immunotherapy agents: Carfilzomib  & Darzalex  faspro    To help prevent nausea and vomiting after your treatment, we encourage you to take your nausea medication as directed.  BELOW ARE SYMPTOMS THAT SHOULD BE REPORTED IMMEDIATELY: *FEVER GREATER THAN 100.4 F (38 C) OR HIGHER *CHILLS OR SWEATING *NAUSEA AND VOMITING THAT IS NOT CONTROLLED WITH YOUR NAUSEA MEDICATION *UNUSUAL SHORTNESS OF BREATH *UNUSUAL BRUISING OR BLEEDING *URINARY PROBLEMS (pain or burning when urinating, or frequent urination) *BOWEL PROBLEMS (unusual diarrhea, constipation, pain near the anus) TENDERNESS IN MOUTH AND THROAT WITH OR WITHOUT PRESENCE OF ULCERS (sore throat, sores in mouth, or a toothache) UNUSUAL RASH, SWELLING OR PAIN  UNUSUAL VAGINAL DISCHARGE OR ITCHING   Items with * indicate a potential emergency and should be followed up as soon as possible or go to the Emergency Department if any problems should occur.  Please show the CHEMOTHERAPY ALERT CARD  or IMMUNOTHERAPY ALERT CARD at check-in to the Emergency Department and triage nurse.  Should you have questions after your visit or need to cancel or reschedule your appointment, please contact [facility]  Dept: [phone]  and follow the prompts.  Office hours are 8:00 a.m. to 4:30 p.m. Monday - Friday. Please note that voicemails left after 4:00 p.m. may not be returned until the following business day.  We are closed weekends and major holidays. You have access to a nurse at all times for urgent questions. Please call the main number to the clinic Dept: [phone] and follow the prompts.   For any non-urgent questions, you may also contact your provider using MyChart. We now offer e-Visits for anyone 68 and older to request care online for non-urgent symptoms. For details visit [email or web address].   Also download the MyChart app! Go to the app store, search MyChart, open the app, select , and log in with your MyChart username and password.

## 2024-04-26 ENCOUNTER — Other Ambulatory Visit (HOSPITAL_COMMUNITY): Payer: Self-pay

## 2024-04-27 ENCOUNTER — Other Ambulatory Visit (HOSPITAL_COMMUNITY): Payer: Self-pay

## 2024-04-27 LAB — MULTIPLE MYELOMA PANEL, SERUM
Albumin SerPl Elph-Mcnc: 3.8 g/dL (ref 2.9–4.4)
Albumin/Glob SerPl: 1 (ref 0.7–1.7)
Alpha 1: 0.2 g/dL (ref 0.0–0.4)
Alpha2 Glob SerPl Elph-Mcnc: 0.7 g/dL (ref 0.4–1.0)
B-Globulin SerPl Elph-Mcnc: 1 g/dL (ref 0.7–1.3)
Gamma Glob SerPl Elph-Mcnc: 1.9 g/dL — ABNORMAL HIGH (ref 0.4–1.8)
Globulin, Total: 3.9 g/dL (ref 2.2–3.9)
IgA: 16 mg/dL — ABNORMAL LOW (ref 61–437)
IgG (Immunoglobin G), Serum: 2209 mg/dL — ABNORMAL HIGH (ref 603–1613)
IgM (Immunoglobulin M), Srm: 9 mg/dL — ABNORMAL LOW (ref 15–143)
M Protein SerPl Elph-Mcnc: 1.6 g/dL — ABNORMAL HIGH
Total Protein ELP: 7.7 g/dL (ref 6.0–8.5)

## 2024-04-30 ENCOUNTER — Inpatient Hospital Stay

## 2024-04-30 ENCOUNTER — Encounter: Payer: Self-pay | Admitting: Hematology

## 2024-04-30 VITALS — BP 177/87 | HR 61 | Temp 98.3°F | Resp 16 | Wt 146.0 lb

## 2024-04-30 DIAGNOSIS — C9001 Multiple myeloma in remission: Secondary | ICD-10-CM

## 2024-04-30 DIAGNOSIS — Z5112 Encounter for antineoplastic immunotherapy: Secondary | ICD-10-CM | POA: Diagnosis not present

## 2024-04-30 LAB — COMPREHENSIVE METABOLIC PANEL WITH GFR
ALT: 73 U/L — ABNORMAL HIGH (ref 0–44)
AST: 99 U/L — ABNORMAL HIGH (ref 15–41)
Albumin: 4.1 g/dL (ref 3.5–5.0)
Alkaline Phosphatase: 118 U/L (ref 38–126)
Anion gap: 3 — ABNORMAL LOW (ref 5–15)
BUN: 12 mg/dL (ref 8–23)
CO2: 29 mmol/L (ref 22–32)
Calcium: 9.3 mg/dL (ref 8.9–10.3)
Chloride: 103 mmol/L (ref 98–111)
Creatinine, Ser: 0.84 mg/dL (ref 0.61–1.24)
GFR, Estimated: >60 mL/min (ref >60)
Glucose, Bld: 121 mg/dL — ABNORMAL HIGH (ref 70–99)
Potassium: 4.3 mmol/L (ref 3.5–5.1)
Sodium: 135 mmol/L (ref 135–145)
Total Bilirubin: 0.5 mg/dL (ref 0.0–1.2)
Total Protein: 7.8 g/dL (ref 6.5–8.1)

## 2024-04-30 LAB — CBC WITH DIFFERENTIAL (CANCER CENTER ONLY)
Abs Immature Granulocytes: 0.02 K/uL (ref 0.00–0.07)
Basophils Absolute: 0 K/uL (ref 0.0–0.1)
Basophils Relative: 0 %
Eosinophils Absolute: 0 K/uL (ref 0.0–0.5)
Eosinophils Relative: 0 %
HCT: 32.7 % — ABNORMAL LOW (ref 39.0–52.0)
Hemoglobin: 11.3 g/dL — ABNORMAL LOW (ref 13.0–17.0)
Immature Granulocytes: 0 %
Lymphocytes Relative: 27 %
Lymphs Abs: 1.5 K/uL (ref 0.7–4.0)
MCH: 34.7 pg — ABNORMAL HIGH (ref 26.0–34.0)
MCHC: 34.6 g/dL (ref 30.0–36.0)
MCV: 100.3 fL — ABNORMAL HIGH (ref 80.0–100.0)
Monocytes Absolute: 0.4 K/uL (ref 0.1–1.0)
Monocytes Relative: 7 %
Neutro Abs: 3.7 K/uL (ref 1.7–7.7)
Neutrophils Relative %: 66 %
Platelet Count: 167 K/uL (ref 150–400)
RBC: 3.26 MIL/uL — ABNORMAL LOW (ref 4.22–5.81)
RDW: 11.9 % (ref 11.5–15.5)
WBC Count: 5.7 K/uL (ref 4.0–10.5)
nRBC: 0 % (ref 0.0–0.2)

## 2024-04-30 MED ORDER — DEXAMETHASONE 4 MG PO TABS
8.0000 mg | ORAL_TABLET | Freq: Once | ORAL | Status: AC
  Administered 2024-04-30: 8 mg via ORAL
  Filled 2024-04-30: qty 2

## 2024-04-30 MED ORDER — SODIUM CHLORIDE 0.9 % IV SOLN: INTRAVENOUS | Status: AC

## 2024-04-30 MED ORDER — DEXTROSE 5 % IV SOLN
36.0000 mg/m2 | Freq: Once | INTRAVENOUS | Status: AC
  Administered 2024-04-30: 70 mg via INTRAVENOUS
  Filled 2024-04-30: qty 30

## 2024-04-30 MED ORDER — ACETAMINOPHEN 500 MG PO TABS
1000.0000 mg | ORAL_TABLET | Freq: Once | ORAL | Status: AC
  Administered 2024-04-30: 1000 mg via ORAL
  Filled 2024-04-30: qty 2

## 2024-04-30 MED ORDER — DIPHENHYDRAMINE HCL 25 MG PO CAPS
50.0000 mg | ORAL_CAPSULE | Freq: Once | ORAL | Status: AC
  Administered 2024-04-30: 50 mg via ORAL
  Filled 2024-04-30: qty 2

## 2024-04-30 NOTE — Patient Instructions (Signed)
 [facility] - A DEPT OF [facility]  Discharge Instructions: Thank you for choosing [facility] to provide your oncology and hematology care.   If you have a lab appointment with the Cancer Center, please go directly to the Cancer Center and check in at the registration area.   Wear comfortable clothing and clothing appropriate for easy access to any Portacath or PICC line.   We strive to give you quality time with your provider. You may need to reschedule your appointment if you arrive late (15 or more minutes).  Arriving late affects you and other patients whose appointments are after yours.  Also, if you miss three or more appointments without notifying the office, you may be dismissed from the clinic at the provider's discretion.      For prescription refill requests, have your pharmacy contact our office and allow 72 hours for refills to be completed.    Today you received the following chemotherapy and/or immunotherapy agents kyprolis      To help prevent nausea and vomiting after your treatment, we encourage you to take your nausea medication as directed.  BELOW ARE SYMPTOMS THAT SHOULD BE REPORTED IMMEDIATELY: *FEVER GREATER THAN 100.4 F (38 C) OR HIGHER *CHILLS OR SWEATING *NAUSEA AND VOMITING THAT IS NOT CONTROLLED WITH YOUR NAUSEA MEDICATION *UNUSUAL SHORTNESS OF BREATH *UNUSUAL BRUISING OR BLEEDING *URINARY PROBLEMS (pain or burning when urinating, or frequent urination) *BOWEL PROBLEMS (unusual diarrhea, constipation, pain near the anus) TENDERNESS IN MOUTH AND THROAT WITH OR WITHOUT PRESENCE OF ULCERS (sore throat, sores in mouth, or a toothache) UNUSUAL RASH, SWELLING OR PAIN  UNUSUAL VAGINAL DISCHARGE OR ITCHING   Items with * indicate a potential emergency and should be followed up as soon as possible or go to the Emergency Department if any problems should occur.  Please show the CHEMOTHERAPY ALERT CARD or IMMUNOTHERAPY  ALERT CARD at check-in to the Emergency Department and triage nurse.  Should you have questions after your visit or need to cancel or reschedule your appointment, please contact [facility] - A DEPT OF [facility]  Dept: [phone]  and follow the prompts.  Office hours are 8:00 a.m. to 4:30 p.m. Monday - Friday. Please note that voicemails left after 4:00 p.m. may not be returned until the following business day.  We are closed weekends and major holidays. You have access to a nurse at all times for urgent questions. Please call the main number to the clinic Dept: [phone] and follow the prompts.   For any non-urgent questions, you may also contact your provider using MyChart. We now offer e-Visits for anyone 80 and older to request care online for non-urgent symptoms. For details visit [email or web address].   Also download the MyChart app! Go to the app store, search "MyChart", open the app, select [place], and log in with your MyChart username and password.

## 2024-04-30 NOTE — Addendum Note (Signed)
 Addended by: TAMELA RAISIN R on: 04/30/2024 11:47 AM   Modules accepted: Orders

## 2024-04-30 NOTE — Progress Notes (Signed)
 AST 99 - Okay to proceed with Tylenol  premed and Kyprolis  treatment at same dose of 36 mg/m2 per Dr. Onesimo.  Harlene Nasuti, PharmD Oncology Infusion Pharmacist 04/30/2024 9:21 AM

## 2024-05-04 ENCOUNTER — Other Ambulatory Visit (HOSPITAL_COMMUNITY): Payer: Self-pay

## 2024-05-04 ENCOUNTER — Other Ambulatory Visit: Payer: Self-pay

## 2024-05-04 ENCOUNTER — Encounter (HOSPITAL_COMMUNITY): Payer: Self-pay

## 2024-05-04 ENCOUNTER — Encounter: Payer: Self-pay | Admitting: Hematology

## 2024-05-04 ENCOUNTER — Emergency Department (HOSPITAL_COMMUNITY)

## 2024-05-04 ENCOUNTER — Emergency Department (HOSPITAL_COMMUNITY)
Admission: EM | Admit: 2024-05-04 | Discharge: 2024-05-04 | Disposition: A | Discharge status: Home / Self Care | Attending: Emergency Medicine | Admitting: Emergency Medicine

## 2024-05-04 DIAGNOSIS — I10 Essential (primary) hypertension: Secondary | ICD-10-CM | POA: Diagnosis not present

## 2024-05-04 DIAGNOSIS — Z8546 Personal history of malignant neoplasm of prostate: Secondary | ICD-10-CM | POA: Diagnosis not present

## 2024-05-04 DIAGNOSIS — K7689 Other specified diseases of liver: Secondary | ICD-10-CM | POA: Diagnosis not present

## 2024-05-04 DIAGNOSIS — Z79899 Other long term (current) drug therapy: Secondary | ICD-10-CM | POA: Diagnosis not present

## 2024-05-04 DIAGNOSIS — K5900 Constipation, unspecified: Secondary | ICD-10-CM | POA: Insufficient documentation

## 2024-05-04 DIAGNOSIS — R509 Fever, unspecified: Secondary | ICD-10-CM | POA: Diagnosis not present

## 2024-05-04 DIAGNOSIS — R109 Unspecified abdominal pain: Secondary | ICD-10-CM | POA: Diagnosis not present

## 2024-05-04 DIAGNOSIS — Z743 Need for continuous supervision: Secondary | ICD-10-CM | POA: Diagnosis not present

## 2024-05-04 LAB — URINALYSIS, ROUTINE W REFLEX MICROSCOPIC
Bilirubin Urine: NEGATIVE
Glucose, UA: NEGATIVE mg/dL
Hgb urine dipstick: NEGATIVE
Ketones, ur: NEGATIVE mg/dL
Leukocytes,Ua: NEGATIVE
Nitrite: NEGATIVE
Protein, ur: NEGATIVE mg/dL
Specific Gravity, Urine: 1.013 (ref 1.005–1.030)
pH: 6 (ref 5.0–8.0)

## 2024-05-04 LAB — COMPREHENSIVE METABOLIC PANEL WITH GFR
ALT: 82 U/L — ABNORMAL HIGH (ref 0–44)
AST: 63 U/L — ABNORMAL HIGH (ref 15–41)
Albumin: 4.1 g/dL (ref 3.5–5.0)
Alkaline Phosphatase: 139 U/L — ABNORMAL HIGH (ref 38–126)
Anion gap: 9 (ref 5–15)
BUN: 18 mg/dL (ref 8–23)
CO2: 24 mmol/L (ref 22–32)
Calcium: 9.5 mg/dL (ref 8.9–10.3)
Chloride: 105 mmol/L (ref 98–111)
Creatinine, Ser: 0.83 mg/dL (ref 0.61–1.24)
GFR, Estimated: >60 mL/min (ref >60)
Glucose, Bld: 83 mg/dL (ref 70–99)
Potassium: 4.8 mmol/L (ref 3.5–5.1)
Sodium: 138 mmol/L (ref 135–145)
Total Bilirubin: 0.2 mg/dL (ref 0.0–1.2)
Total Protein: 7.7 g/dL (ref 6.5–8.1)

## 2024-05-04 LAB — CBC WITH DIFFERENTIAL/PLATELET
Abs Immature Granulocytes: 0.02 K/uL (ref 0.00–0.07)
Basophils Absolute: 0 K/uL (ref 0.0–0.1)
Basophils Relative: 0 %
Eosinophils Absolute: 0 K/uL (ref 0.0–0.5)
Eosinophils Relative: 0 %
HCT: 33.4 % — ABNORMAL LOW (ref 39.0–52.0)
Hemoglobin: 10.7 g/dL — ABNORMAL LOW (ref 13.0–17.0)
Immature Granulocytes: 0 %
Lymphocytes Relative: 12 %
Lymphs Abs: 0.9 K/uL (ref 0.7–4.0)
MCH: 33.6 pg (ref 26.0–34.0)
MCHC: 32 g/dL (ref 30.0–36.0)
MCV: 105 fL — ABNORMAL HIGH (ref 80.0–100.0)
Monocytes Absolute: 0.3 K/uL (ref 0.1–1.0)
Monocytes Relative: 4 %
Neutro Abs: 6.4 K/uL (ref 1.7–7.7)
Neutrophils Relative %: 84 %
Platelets: 161 K/uL (ref 150–400)
RBC: 3.18 MIL/uL — ABNORMAL LOW (ref 4.22–5.81)
RDW: 12.2 % (ref 11.5–15.5)
WBC: 7.7 K/uL (ref 4.0–10.5)
nRBC: 0 % (ref 0.0–0.2)

## 2024-05-04 LAB — I-STAT CHEM 8, ED
BUN: 19 mg/dL (ref 8–23)
Calcium, Ion: 1.24 mmol/L (ref 1.15–1.40)
Chloride: 105 mmol/L (ref 98–111)
Creatinine, Ser: 0.9 mg/dL (ref 0.61–1.24)
Glucose, Bld: 82 mg/dL (ref 70–99)
HCT: 33 % — ABNORMAL LOW (ref 39.0–52.0)
Hemoglobin: 11.2 g/dL — ABNORMAL LOW (ref 13.0–17.0)
Potassium: 4.7 mmol/L (ref 3.5–5.1)
Sodium: 140 mmol/L (ref 135–145)
TCO2: 24 mmol/L (ref 22–32)

## 2024-05-04 LAB — LIPASE, BLOOD: Lipase: 53 U/L — ABNORMAL HIGH (ref 11–51)

## 2024-05-04 MED ORDER — IOHEXOL 300 MG/ML  SOLN
100.0000 mL | Freq: Once | INTRAMUSCULAR | Status: AC | PRN
  Administered 2024-05-04: 100 mL via INTRAVENOUS

## 2024-05-04 MED ORDER — LOSARTAN POTASSIUM 50 MG PO TABS
100.0000 mg | ORAL_TABLET | Freq: Once | ORAL | Status: AC
  Administered 2024-05-04: 100 mg via ORAL
  Filled 2024-05-04: qty 2

## 2024-05-04 MED ORDER — SODIUM CHLORIDE (PF) 0.9 % IJ SOLN
INTRAMUSCULAR | Status: AC
  Filled 2024-05-04: qty 50

## 2024-05-04 MED ORDER — AMLODIPINE BESYLATE 5 MG PO TABS
5.0000 mg | ORAL_TABLET | Freq: Once | ORAL | Status: AC
  Administered 2024-05-04: 5 mg via ORAL
  Filled 2024-05-04: qty 1

## 2024-05-04 MED ORDER — FLEET ENEMA RE ENEM
1.0000 | ENEMA | Freq: Once | RECTAL | Status: AC
Start: 2024-05-04 — End: 2024-05-04
  Administered 2024-05-04: 1 via RECTAL
  Filled 2024-05-04: qty 1

## 2024-05-04 MED ORDER — POLYETHYLENE GLYCOL 3350 17 GM/SCOOP PO POWD
17.0000 g | Freq: Two times a day (BID) | ORAL | 0 refills | Status: DC
  Filled 2024-05-04: qty 238, 7d supply, fill #0

## 2024-05-04 NOTE — Discharge Instructions (Addendum)
 Take the medications as prescribed to help with your constipation.  Follow-up with your doctor to be rechecked.  Return to the ER for fevers chills or other concerning symptoms  Your blood pressure was also elevated today.  Make sure to take your blood pressure medications regularly.  Follow-up with your primary care doctor to have that rechecked

## 2024-05-04 NOTE — ED Provider Notes (Signed)
 [place] EMERGENCY DEPARTMENT AT [facility] Provider Note   CSN: [id number] Arrival date & time: 05/04/24  9161     Patient presents with: Constipation   Travis Nguyen is a 73 y.o. male.    Constipation    Patient has a history of hypertension pneumonia gallstones chronic back pain prostate cancer multiple myeloma.  Patient states he felt constipated today.  He had not had a bowel movement in 6 days.  Whenever he feels constipated he drinks chocolate milk.  He tried doing that this morning and then started having more severe abdominal cramping.  He broke out into a sweat.  Felt somewhat nauseated.  Patient has not tried any stool softeners.  He has not had any fevers or chills.  No vomiting.  Prior to Admission medications   Medication Sig Start Date End Date Taking? Authorizing Provider  polyethylene glycol powder (MIRALAX ) 17 GM/SCOOP powder Take 17 g by mouth 2 (two) times daily. Dissolve 1 capful (17g) in 4-8 ounces of liquid and take by mouth twice daily. 05/04/24  Yes Randol Simmonds, MD  acyclovir  (ZOVIRAX ) 400 MG tablet Take 1 tablet (400 mg total) by mouth 2 (two) times daily. 11/28/23   Onesimo Emaline Brink, MD  amLODipine  (NORVASC ) 5 MG tablet Take 1 tablet (5 mg total) by mouth daily. 02/27/24     B Complex-C (B-COMPLEX WITH VITAMIN C) tablet Take 1 tablet by mouth daily.    [provider]  Cyanocobalamin (VITAMIN B12 PO) Take 1 tablet by mouth daily.    [provider]  lidocaine  (LIDODERM ) 5 % Place 1 patch onto the skin daily. Remove & Discard patch within 12 hours or as directed by MD 03/13/24   Hoy Nidia FALCON, PA-C  lidocaine -prilocaine  (EMLA ) cream APPLY TOPICALLY TO PORT-A-CATH DAILY AS NEEDED 04/08/24 04/08/25  Onesimo Emaline Brink, MD  losartan  (COZAAR ) 100 MG tablet Take 1 tablet (100 mg total) by mouth daily. 02/27/24     methocarbamol  (ROBAXIN ) 500 MG tablet Take 1 tablet (500 mg total) by mouth every 6 (six) hours as needed for  muscle spasms. 03/16/24   Raford Lenis, MD  oxyCODONE -acetaminophen  (PERCOCET ) 10-325 MG tablet Take 1 tablet by mouth 2 times a day as needed orally (ok to fill 30 days after last refill) 02/25/22     oxyCODONE -acetaminophen  (PERCOCET ) 10-325 MG tablet Take 1 tablet by mouth 2 (two) times daily. 12/22/23     oxyCODONE -acetaminophen  (PERCOCET ) 10-325 MG tablet Take 1 tablet by mouth 2 (two) times daily as needed may fill 30 days after last script 03/25/24     oxyCODONE -acetaminophen  (PERCOCET ) 10-325 MG tablet Take 1 tablet by mouth 2 (two) times daily. 04/22/24     pantoprazole  (PROTONIX ) 40 MG tablet Take 1 tablet (40 mg total) by mouth every morning 1/2-1 hour before breakfast. Patient not taking: Reported on 04/02/2024 02/27/24       Allergies: Aspirin    Review of Systems  Gastrointestinal:  Positive for constipation.    Updated Vital Signs BP (!) 192/108   Pulse 63   Temp 98.3 F (36.8 C) (Oral)   Resp 16   SpO2 100%   Physical Exam Vitals and nursing note reviewed.  Constitutional:      Appearance: He is well-developed. He is not diaphoretic.  HENT:     Head: Normocephalic and atraumatic.     Right Ear: External ear normal.     Left Ear: External ear normal.  Eyes:     General: No scleral  icterus.       Right eye: No discharge.        Left eye: No discharge.     Conjunctiva/sclera: Conjunctivae normal.  Neck:     Trachea: No tracheal deviation.  Cardiovascular:     Rate and Rhythm: Normal rate and regular rhythm.  Pulmonary:     Effort: Pulmonary effort is normal. No respiratory distress.     Breath sounds: Normal breath sounds. No stridor. No wheezing or rales.  Abdominal:     General: Bowel sounds are normal. There is no distension.     Palpations: Abdomen is soft.     Tenderness: There is abdominal tenderness. There is no guarding or rebound.     Comments: Mild tenderness abdomen  Musculoskeletal:        General: No tenderness or deformity.     Cervical back: Neck  supple.  Skin:    General: Skin is warm and dry.     Findings: No rash.  Neurological:     General: No focal deficit present.     Mental Status: He is alert.     Cranial Nerves: No cranial nerve deficit, dysarthria or facial asymmetry.     Sensory: No sensory deficit.     Motor: No abnormal muscle tone or seizure activity.     Coordination: Coordination normal.  Psychiatric:        Mood and Affect: Mood normal.     (all labs ordered are listed, but only abnormal results are displayed) Labs Reviewed  COMPREHENSIVE METABOLIC PANEL WITH GFR - Abnormal; Notable for the following components:      Result Value   AST 63 (*)    ALT 82 (*)    Alkaline Phosphatase 139 (*)    All other components within normal limits  LIPASE, BLOOD - Abnormal; Notable for the following components:   Lipase 53 (*)    All other components within normal limits  CBC WITH DIFFERENTIAL/PLATELET - Abnormal; Notable for the following components:   RBC 3.18 (*)    Hemoglobin 10.7 (*)    HCT 33.4 (*)    MCV 105.0 (*)    All other components within normal limits  URINALYSIS, ROUTINE W REFLEX MICROSCOPIC - Abnormal; Notable for the following components:   Color, Urine STRAW (*)    All other components within normal limits  I-STAT CHEM 8, ED - Abnormal; Notable for the following components:   Hemoglobin 11.2 (*)    HCT 33.0 (*)    All other components within normal limits    EKG: EKG Interpretation Date/Time:  Tuesday May 04 2024 10:22:12 EDT Ventricular Rate:  68 PR Interval:  176 QRS Duration:  96 QT Interval:  376 QTC Calculation: 399 R Axis:   3  Text Interpretation: Normal sinus rhythm Minimal voltage criteria for LVH, may be normal variant ( Cornell product ) Nonspecific ST and T wave abnormality , new since last tracing Abnormal ECG Confirmed by Randol Simmonds [id number]) on 05/04/2024 10:29:27 AM  Radiology: CT ABDOMEN PELVIS W CONTRAST Result Date: 05/04/2024 CLINICAL DATA:  Mid abdominal pain  since this morning, multiple myeloma, low-grade fever EXAM: CT ABDOMEN AND PELVIS WITH CONTRAST TECHNIQUE: Multidetector CT imaging of the abdomen and pelvis was performed using the standard protocol following bolus administration of intravenous contrast. RADIATION DOSE REDUCTION: This exam was performed according to the departmental dose-optimization program which includes automated exposure control, adjustment of the mA and/or kV according to patient size and/or use of iterative reconstruction technique.  CONTRAST:  OMNIPAQUE  IOHEXOL  300 MG/ML  SOLN COMPARISON:  11/12/2023 FINDINGS: Lower chest: No acute pleural or parenchymal lung disease. Hepatobiliary: Hepatic steatosis. Stable small hepatic cyst and right lobe liver hemangioma. No biliary duct dilation. Prior cholecystectomy. Pancreas: Unremarkable. No pancreatic ductal dilatation or surrounding inflammatory changes. Spleen: Normal in size without focal abnormality. Adrenals/Urinary Tract: Stable simple appearing bilateral renal cortical cysts, which do not require specific imaging follow-up. No urinary tract calculi or obstructive uropathy. The adrenals and bladder are unremarkable. Stomach/Bowel: There is a large amount of retained stool throughout the colon, greatest in the rectal vault. Findings are consistent with constipation and likely superimposed fecal impaction. No evidence of stercoral colitis. No bowel obstruction or ileus. The appendix is surgically absent. Vascular/Lymphatic: No significant vascular findings are present. No enlarged abdominal or pelvic lymph nodes. Reproductive: Prostate is unremarkable. Other: No free fluid or free intraperitoneal gas. No abdominal wall hernia. Musculoskeletal: Severe bilateral hip osteoarthritis. No acute fractures. There is mottled sclerosis and punctate lucencies noted throughout the visualized bony structures, most pronounced within the iliac bones and spine, compatible with known history of multiple  myeloma. Reconstructed images demonstrate no additional findings. IMPRESSION: 1. Large amount of retained stool throughout the colon consistent with constipation. Significant stool within the rectal vault likely reflects superimposed fecal impaction. No evidence of stercoral colitis. 2. Hepatic steatosis. 3. Mottled sclerosis and punctate lucencies throughout the visualized bony structures compatible with known multiple myeloma. No acute fracture. Electronically Signed   By: Ozell Daring M.D.   On: 05/04/2024 15:14     Procedures   Medications Ordered in the ED  amLODipine  (NORVASC ) tablet 5 mg (has no administration in time range)  losartan  (COZAAR ) tablet 100 mg (has no administration in time range)  amLODipine  (NORVASC ) tablet 5 mg (5 mg Oral Given 05/04/24 1040)  iohexol  (OMNIPAQUE ) 300 MG/ML solution 100 mL (100 mLs Intravenous Contrast Given 05/04/24 1344)  sodium phosphate  (FLEET) enema 1 enema (1 enema Rectal Given 05/04/24 1551)    Clinical Course as of 05/04/24 1638  Tue May 04, 2024  1532 CT scan shows a large amount of stool within the colon.  No evidence of stercoral colitis [JK]    Clinical Course User Index [JK] Randol Simmonds, MD                                 Medical Decision Making Problems Addressed: Constipation, unspecified constipation type: acute illness or injury that poses a threat to life or bodily functions Hypertension, unspecified type: chronic illness or injury  Amount and/or Complexity of Data Reviewed Labs: ordered. Decision-making details documented in ED Course. Radiology: ordered and independent interpretation performed.  Risk OTC drugs. Prescription drug management.   Patient presented to the ED for evaluation of abdominal pain.  Concerned about the possibility of constipation obstruction colitis diverticulitis.  Patient CT scan does not show any signs of colitis diverticulitis does show findings of constipation.  Patient's laboratory test did  not suggest infection.  No acute anemia.  Patient also noted to be hypertensive in the ED.  Patient's not having chest pain I think this is unrelated but he had not taken his medications today.  Patient was given a dose of blood pressure medications.  Did counsel the patient on use of stool softeners as he has been using chocolate milk.  Will give him a prescription for MiraLAX .     Final diagnoses:  Constipation,  unspecified constipation type  Hypertension, unspecified type    ED Discharge Orders          Ordered    polyethylene glycol powder (MIRALAX ) 17 GM/SCOOP powder  2 times daily        05/04/24 1633               Randol Simmonds, MD 05/04/24 [phone]

## 2024-05-04 NOTE — ED Triage Notes (Signed)
 Patient BIB EMS for constipation.  Patient is a cancer patient and usually drinks chocolate milk to help move bowels and this did not work.  Elevated BP, has taken BP medication.  Patient states last BM was 6 days ago and has been taking medication for constipation.

## 2024-05-07 ENCOUNTER — Inpatient Hospital Stay (HOSPITAL_BASED_OUTPATIENT_CLINIC_OR_DEPARTMENT_OTHER): Admitting: Hematology

## 2024-05-07 ENCOUNTER — Other Ambulatory Visit: Payer: Self-pay | Admitting: Hematology

## 2024-05-07 ENCOUNTER — Inpatient Hospital Stay

## 2024-05-07 ENCOUNTER — Other Ambulatory Visit (HOSPITAL_COMMUNITY): Payer: Self-pay

## 2024-05-07 VITALS — BP 173/90 | HR 64 | Temp 98.8°F | Resp 16

## 2024-05-07 DIAGNOSIS — C9002 Multiple myeloma in relapse: Secondary | ICD-10-CM

## 2024-05-07 DIAGNOSIS — C9001 Multiple myeloma in remission: Secondary | ICD-10-CM

## 2024-05-07 DIAGNOSIS — Z5112 Encounter for antineoplastic immunotherapy: Secondary | ICD-10-CM

## 2024-05-07 LAB — CBC WITH DIFFERENTIAL (CANCER CENTER ONLY)
Abs Immature Granulocytes: 0.03 K/uL (ref 0.00–0.07)
Basophils Absolute: 0 K/uL (ref 0.0–0.1)
Basophils Relative: 0 %
Eosinophils Absolute: 0 K/uL (ref 0.0–0.5)
Eosinophils Relative: 0 %
HCT: 30.2 % — ABNORMAL LOW (ref 39.0–52.0)
Hemoglobin: 10.6 g/dL — ABNORMAL LOW (ref 13.0–17.0)
Immature Granulocytes: 0 %
Lymphocytes Relative: 24 %
Lymphs Abs: 1.8 K/uL (ref 0.7–4.0)
MCH: 34.6 pg — ABNORMAL HIGH (ref 26.0–34.0)
MCHC: 35.1 g/dL (ref 30.0–36.0)
MCV: 98.7 fL (ref 80.0–100.0)
Monocytes Absolute: 0.4 K/uL (ref 0.1–1.0)
Monocytes Relative: 5 %
Neutro Abs: 5.2 K/uL (ref 1.7–7.7)
Neutrophils Relative %: 71 %
Platelet Count: 164 K/uL (ref 150–400)
RBC: 3.06 MIL/uL — ABNORMAL LOW (ref 4.22–5.81)
RDW: 11.9 % (ref 11.5–15.5)
WBC Count: 7.4 K/uL (ref 4.0–10.5)
nRBC: 0 % (ref 0.0–0.2)

## 2024-05-07 LAB — COMPREHENSIVE METABOLIC PANEL WITH GFR
ALT: 29 U/L (ref 0–44)
AST: 17 U/L (ref 15–41)
Albumin: 4.1 g/dL (ref 3.5–5.0)
Alkaline Phosphatase: 112 U/L (ref 38–126)
Anion gap: 5 (ref 5–15)
BUN: 16 mg/dL (ref 8–23)
CO2: 26 mmol/L (ref 22–32)
Calcium: 9.1 mg/dL (ref 8.9–10.3)
Chloride: 105 mmol/L (ref 98–111)
Creatinine, Ser: 0.91 mg/dL (ref 0.61–1.24)
GFR, Estimated: >60 mL/min (ref >60)
Glucose, Bld: 99 mg/dL (ref 70–99)
Potassium: 3.9 mmol/L (ref 3.5–5.1)
Sodium: 136 mmol/L (ref 135–145)
Total Bilirubin: 0.3 mg/dL (ref 0.0–1.2)
Total Protein: 7.6 g/dL (ref 6.5–8.1)

## 2024-05-07 MED ORDER — DARATUMUMAB-HYALURONIDASE-FIHJ 1800-30000 MG-UT/15ML ~~LOC~~ SOLN
1800.0000 mg | Freq: Once | SUBCUTANEOUS | Status: AC
  Administered 2024-05-07: 1800 mg via SUBCUTANEOUS
  Filled 2024-05-07: qty 15

## 2024-05-07 MED ORDER — DIPHENHYDRAMINE HCL 25 MG PO CAPS
50.0000 mg | ORAL_CAPSULE | Freq: Once | ORAL | Status: AC
  Administered 2024-05-07: 50 mg via ORAL
  Filled 2024-05-07: qty 2

## 2024-05-07 MED ORDER — DEXTROSE 5 % IV SOLN
36.0000 mg/m2 | Freq: Once | INTRAVENOUS | Status: AC
  Administered 2024-05-07: 70 mg via INTRAVENOUS
  Filled 2024-05-07: qty 30

## 2024-05-07 MED ORDER — SODIUM CHLORIDE 0.9 % IV SOLN: INTRAVENOUS | Status: AC

## 2024-05-07 MED ORDER — DEXAMETHASONE 4 MG PO TABS
8.0000 mg | ORAL_TABLET | Freq: Once | ORAL | Status: AC
  Administered 2024-05-07: 8 mg via ORAL
  Filled 2024-05-07: qty 2

## 2024-05-07 MED ORDER — ACETAMINOPHEN 500 MG PO TABS
1000.0000 mg | ORAL_TABLET | Freq: Once | ORAL | Status: AC
  Administered 2024-05-07: 1000 mg via ORAL
  Filled 2024-05-07: qty 2
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
## 2024-05-07 NOTE — Patient Instructions (Signed)
 [facility] - A DEPT OF [place]  Discharge Instructions: Thank you for choosing [facility] to provide your oncology and hematology care.   If you have a lab appointment with the Cancer Center, please go directly to the Cancer Center and check in at the registration area.   Wear comfortable clothing and clothing appropriate for easy access to any Portacath or PICC line.   We strive to give you quality time with your provider. You may need to reschedule your appointment if you arrive late (15 or more minutes).  Arriving late affects you and other patients whose appointments are after yours.  Also, if you miss three or more appointments without notifying the office, you may be dismissed from the clinic at the provider's discretion.      For prescription refill requests, have your pharmacy contact our office and allow 72 hours for refills to be completed.    Today you received the following chemotherapy and/or immunotherapy agents: carfilzomib  and daratumumab -hyaluronidase -fihj      To help prevent nausea and vomiting after your treatment, we encourage you to take your nausea medication as directed.  BELOW ARE SYMPTOMS THAT SHOULD BE REPORTED IMMEDIATELY: *FEVER GREATER THAN 100.4 F (38 C) OR HIGHER *CHILLS OR SWEATING *NAUSEA AND VOMITING THAT IS NOT CONTROLLED WITH YOUR NAUSEA MEDICATION *UNUSUAL SHORTNESS OF BREATH *UNUSUAL BRUISING OR BLEEDING *URINARY PROBLEMS (pain or burning when urinating, or frequent urination) *BOWEL PROBLEMS (unusual diarrhea, constipation, pain near the anus) TENDERNESS IN MOUTH AND THROAT WITH OR WITHOUT PRESENCE OF ULCERS (sore throat, sores in mouth, or a toothache) UNUSUAL RASH, SWELLING OR PAIN  UNUSUAL VAGINAL DISCHARGE OR ITCHING   Items with * indicate a potential emergency and should be followed up as soon as possible or go to the Emergency Department if any problems should occur.  Please show the  CHEMOTHERAPY ALERT CARD or IMMUNOTHERAPY ALERT CARD at check-in to the Emergency Department and triage nurse.  Should you have questions after your visit or need to cancel or reschedule your appointment, please contact [facility] - A DEPT OF [facility]  Dept: [phone]  and follow the prompts.  Office hours are 8:00 a.m. to 4:30 p.m. Monday - Friday. Please note that voicemails left after 4:00 p.m. may not be returned until the following business day.  We are closed weekends and major holidays. You have access to a nurse at all times for urgent questions. Please call the main number to the clinic Dept: [phone] and follow the prompts.   For any non-urgent questions, you may also contact your provider using MyChart. We now offer e-Visits for anyone 55 and older to request care online for non-urgent symptoms. For details visit [email or web address].   Also download the MyChart app! Go to the app store, search "MyChart", open the app, select [place], and log in with your MyChart username and password.

## 2024-05-21 ENCOUNTER — Encounter: Payer: Self-pay | Admitting: Hematology

## 2024-05-21 ENCOUNTER — Inpatient Hospital Stay

## 2024-05-21 ENCOUNTER — Inpatient Hospital Stay: Attending: Oncology

## 2024-05-21 VITALS — BP 190/84 | HR 69 | Temp 98.5°F | Resp 16 | Wt 144.0 lb

## 2024-05-21 DIAGNOSIS — Z5112 Encounter for antineoplastic immunotherapy: Secondary | ICD-10-CM | POA: Diagnosis present

## 2024-05-21 DIAGNOSIS — Z9049 Acquired absence of other specified parts of digestive tract: Secondary | ICD-10-CM | POA: Insufficient documentation

## 2024-05-21 DIAGNOSIS — Z886 Allergy status to analgesic agent status: Secondary | ICD-10-CM | POA: Diagnosis not present

## 2024-05-21 DIAGNOSIS — Z7961 Long term (current) use of immunomodulator: Secondary | ICD-10-CM | POA: Diagnosis not present

## 2024-05-21 DIAGNOSIS — C61 Malignant neoplasm of prostate: Secondary | ICD-10-CM | POA: Insufficient documentation

## 2024-05-21 DIAGNOSIS — C9001 Multiple myeloma in remission: Secondary | ICD-10-CM

## 2024-05-21 DIAGNOSIS — Z8701 Personal history of pneumonia (recurrent): Secondary | ICD-10-CM | POA: Insufficient documentation

## 2024-05-21 DIAGNOSIS — Z85820 Personal history of malignant melanoma of skin: Secondary | ICD-10-CM | POA: Insufficient documentation

## 2024-05-21 DIAGNOSIS — Z8249 Family history of ischemic heart disease and other diseases of the circulatory system: Secondary | ICD-10-CM | POA: Insufficient documentation

## 2024-05-21 DIAGNOSIS — Z8042 Family history of malignant neoplasm of prostate: Secondary | ICD-10-CM | POA: Insufficient documentation

## 2024-05-21 DIAGNOSIS — Z7982 Long term (current) use of aspirin: Secondary | ICD-10-CM | POA: Insufficient documentation

## 2024-05-21 DIAGNOSIS — C9002 Multiple myeloma in relapse: Secondary | ICD-10-CM | POA: Insufficient documentation

## 2024-05-21 DIAGNOSIS — Z79899 Other long term (current) drug therapy: Secondary | ICD-10-CM | POA: Insufficient documentation

## 2024-05-21 DIAGNOSIS — N281 Cyst of kidney, acquired: Secondary | ICD-10-CM | POA: Diagnosis not present

## 2024-05-21 DIAGNOSIS — Z7969 Long term (current) use of other immunomodulators and immunosuppressants: Secondary | ICD-10-CM | POA: Insufficient documentation

## 2024-05-21 DIAGNOSIS — Z79624 Long term (current) use of inhibitors of nucleotide synthesis: Secondary | ICD-10-CM | POA: Insufficient documentation

## 2024-05-21 DIAGNOSIS — D1803 Hemangioma of intra-abdominal structures: Secondary | ICD-10-CM | POA: Insufficient documentation

## 2024-05-21 DIAGNOSIS — Z87891 Personal history of nicotine dependence: Secondary | ICD-10-CM | POA: Diagnosis not present

## 2024-05-21 DIAGNOSIS — Z8616 Personal history of COVID-19: Secondary | ICD-10-CM | POA: Insufficient documentation

## 2024-05-21 DIAGNOSIS — Z9079 Acquired absence of other genital organ(s): Secondary | ICD-10-CM | POA: Insufficient documentation

## 2024-05-21 DIAGNOSIS — M160 Bilateral primary osteoarthritis of hip: Secondary | ICD-10-CM | POA: Insufficient documentation

## 2024-05-21 DIAGNOSIS — Z79891 Long term (current) use of opiate analgesic: Secondary | ICD-10-CM | POA: Insufficient documentation

## 2024-05-21 DIAGNOSIS — K7689 Other specified diseases of liver: Secondary | ICD-10-CM | POA: Diagnosis not present

## 2024-05-21 DIAGNOSIS — Z79620 Long term (current) use of immunosuppressive biologic: Secondary | ICD-10-CM | POA: Diagnosis not present

## 2024-05-21 LAB — CBC WITH DIFFERENTIAL (CANCER CENTER ONLY)
Abs Immature Granulocytes: 0.01 K/uL (ref 0.00–0.07)
Basophils Absolute: 0 K/uL (ref 0.0–0.1)
Basophils Relative: 0 %
Eosinophils Absolute: 0 K/uL (ref 0.0–0.5)
Eosinophils Relative: 0 %
HCT: 31.8 % — ABNORMAL LOW (ref 39.0–52.0)
Hemoglobin: 10.8 g/dL — ABNORMAL LOW (ref 13.0–17.0)
Immature Granulocytes: 0 %
Lymphocytes Relative: 41 %
Lymphs Abs: 2.1 K/uL (ref 0.7–4.0)
MCH: 33.9 pg (ref 26.0–34.0)
MCHC: 34 g/dL (ref 30.0–36.0)
MCV: 99.7 fL (ref 80.0–100.0)
Monocytes Absolute: 0.2 K/uL (ref 0.1–1.0)
Monocytes Relative: 4 %
Neutro Abs: 2.8 K/uL (ref 1.7–7.7)
Neutrophils Relative %: 55 %
Platelet Count: 227 K/uL (ref 150–400)
RBC: 3.19 MIL/uL — ABNORMAL LOW (ref 4.22–5.81)
RDW: 12.5 % (ref 11.5–15.5)
WBC Count: 5.1 K/uL (ref 4.0–10.5)
nRBC: 0 % (ref 0.0–0.2)

## 2024-05-21 LAB — COMPREHENSIVE METABOLIC PANEL WITH GFR
ALT: 13 U/L (ref 0–44)
AST: 18 U/L (ref 15–41)
Albumin: 4.4 g/dL (ref 3.5–5.0)
Alkaline Phosphatase: 82 U/L (ref 38–126)
Anion gap: 5 (ref 5–15)
BUN: 11 mg/dL (ref 8–23)
CO2: 26 mmol/L (ref 22–32)
Calcium: 10 mg/dL (ref 8.9–10.3)
Chloride: 104 mmol/L (ref 98–111)
Creatinine, Ser: 0.92 mg/dL (ref 0.61–1.24)
GFR, Estimated: >60 mL/min (ref >60)
Glucose, Bld: 102 mg/dL — ABNORMAL HIGH (ref 70–99)
Potassium: 3.9 mmol/L (ref 3.5–5.1)
Sodium: 135 mmol/L (ref 135–145)
Total Bilirubin: 0.5 mg/dL (ref 0.0–1.2)
Total Protein: 8.2 g/dL — ABNORMAL HIGH (ref 6.5–8.1)

## 2024-05-21 MED ORDER — SODIUM CHLORIDE 0.9 % IV SOLN: INTRAVENOUS | Status: AC

## 2024-05-21 MED ORDER — DEXTROSE 5 % IV SOLN
56.0000 mg/m2 | Freq: Once | INTRAVENOUS | Status: AC
  Administered 2024-05-21: 110 mg via INTRAVENOUS
  Filled 2024-05-21: qty 30

## 2024-05-21 MED ORDER — ACETAMINOPHEN 500 MG PO TABS
1000.0000 mg | ORAL_TABLET | Freq: Once | ORAL | Status: AC
  Administered 2024-05-21: 1000 mg via ORAL

## 2024-05-21 MED ORDER — DEXAMETHASONE 4 MG PO TABS
8.0000 mg | ORAL_TABLET | Freq: Once | ORAL | Status: AC
  Administered 2024-05-21: 8 mg via ORAL

## 2024-05-21 MED ORDER — DIPHENHYDRAMINE HCL 25 MG PO CAPS
50.0000 mg | ORAL_CAPSULE | Freq: Once | ORAL | Status: AC
  Administered 2024-05-21: 50 mg via ORAL

## 2024-05-21 MED ORDER — DARATUMUMAB-HYALURONIDASE-FIHJ 1800-30000 MG-UT/15ML ~~LOC~~ SOLN
1800.0000 mg | Freq: Once | SUBCUTANEOUS | Status: AC
  Administered 2024-05-21: 1800 mg via SUBCUTANEOUS
  Filled 2024-05-21: qty 15

## 2024-05-21 NOTE — Patient Instructions (Signed)
 [facility] - A DEPT OF [place]  Discharge Instructions: Thank you for choosing [facility] to provide your oncology and hematology care.   If you have a lab appointment with the Cancer Center, please go directly to the Cancer Center and check in at the registration area.   Wear comfortable clothing and clothing appropriate for easy access to any Portacath or PICC line.   We strive to give you quality time with your provider. You may need to reschedule your appointment if you arrive late (15 or more minutes).  Arriving late affects you and other patients whose appointments are after yours.  Also, if you miss three or more appointments without notifying the office, you may be dismissed from the clinic at the provider's discretion.      For prescription refill requests, have your pharmacy contact our office and allow 72 hours for refills to be completed.    Today you received the following chemotherapy and/or immunotherapy agents: carfilzomib  and daratumumab -hyaluronidase -fihj(DARZALEX  FASPRO)     To help prevent nausea and vomiting after your treatment, we encourage you to take your nausea medication as directed.  BELOW ARE SYMPTOMS THAT SHOULD BE REPORTED IMMEDIATELY: *FEVER GREATER THAN 100.4 F (38 C) OR HIGHER *CHILLS OR SWEATING *NAUSEA AND VOMITING THAT IS NOT CONTROLLED WITH YOUR NAUSEA MEDICATION *UNUSUAL SHORTNESS OF BREATH *UNUSUAL BRUISING OR BLEEDING *URINARY PROBLEMS (pain or burning when urinating, or frequent urination) *BOWEL PROBLEMS (unusual diarrhea, constipation, pain near the anus) TENDERNESS IN MOUTH AND THROAT WITH OR WITHOUT PRESENCE OF ULCERS (sore throat, sores in mouth, or a toothache) UNUSUAL RASH, SWELLING OR PAIN  UNUSUAL VAGINAL DISCHARGE OR ITCHING   Items with * indicate a potential emergency and should be followed up as soon as possible or go to the Emergency Department if any problems should  occur.  Please show the CHEMOTHERAPY ALERT CARD or IMMUNOTHERAPY ALERT CARD at check-in to the Emergency Department and triage nurse.  Should you have questions after your visit or need to cancel or reschedule your appointment, please contact [facility] - [facility] DEPT [facility]  Dept: [phone]  and follow the prompts.  Office hours are 8:00 a.m. to 4:30 p.m. Monday - Friday. Please note that voicemails left after 4:00 p.m. may not be returned until the following business day.  We are closed weekends and major holidays. You have access to a nurse at all times for urgent questions. Please call the main number to the clinic Dept: [phone] and follow the prompts.   For any non-urgent questions, you may also contact your provider using MyChart. We now offer e-Visits for anyone 77 and older to request care online for non-urgent symptoms. For details visit [email or web address].   Also download the MyChart app! Go to the app store, search MyChart, open the app, select [place], and log in with your MyChart username and password.

## 2024-05-24 ENCOUNTER — Other Ambulatory Visit (HOSPITAL_COMMUNITY): Payer: Self-pay

## 2024-05-25 ENCOUNTER — Other Ambulatory Visit (HOSPITAL_COMMUNITY): Payer: Self-pay

## 2024-05-25 MED ORDER — OXYCODONE-ACETAMINOPHEN 10-325 MG PO TABS
1.0000 | ORAL_TABLET | Freq: Two times a day (BID) | ORAL | 0 refills | Status: DC | PRN
  Filled 2024-05-27: qty 60, 30d supply, fill #0

## 2024-05-26 ENCOUNTER — Other Ambulatory Visit: Payer: Self-pay

## 2024-05-27 ENCOUNTER — Other Ambulatory Visit (HOSPITAL_COMMUNITY): Payer: Self-pay

## 2024-05-27 LAB — MULTIPLE MYELOMA PANEL, SERUM
Albumin SerPl Elph-Mcnc: 3.9 g/dL (ref 2.9–4.4)
Albumin/Glob SerPl: 1 (ref 0.7–1.7)
Alpha 1: 0.3 g/dL (ref 0.0–0.4)
Alpha2 Glob SerPl Elph-Mcnc: 0.7 g/dL (ref 0.4–1.0)
B-Globulin SerPl Elph-Mcnc: 1 g/dL (ref 0.7–1.3)
Gamma Glob SerPl Elph-Mcnc: 1.9 g/dL — ABNORMAL HIGH (ref 0.4–1.8)
Globulin, Total: 4 g/dL — ABNORMAL HIGH (ref 2.2–3.9)
IgA: 15 mg/dL — ABNORMAL LOW (ref 61–437)
IgG (Immunoglobin G), Serum: 2274 mg/dL — ABNORMAL HIGH (ref 603–1613)
IgM (Immunoglobulin M), Srm: 9 mg/dL — ABNORMAL LOW (ref 15–143)
M Protein SerPl Elph-Mcnc: 1.7 g/dL — ABNORMAL HIGH
Total Protein ELP: 7.9 g/dL (ref 6.0–8.5)

## 2024-05-28 ENCOUNTER — Inpatient Hospital Stay

## 2024-05-28 VITALS — BP 183/82 | HR 58 | Temp 98.2°F | Resp 16 | Wt 145.0 lb

## 2024-05-28 DIAGNOSIS — Z5112 Encounter for antineoplastic immunotherapy: Secondary | ICD-10-CM | POA: Diagnosis not present

## 2024-05-28 DIAGNOSIS — C9001 Multiple myeloma in remission: Secondary | ICD-10-CM

## 2024-05-28 LAB — CBC WITH DIFFERENTIAL (CANCER CENTER ONLY)
Abs Immature Granulocytes: 0.01 K/uL (ref 0.00–0.07)
Basophils Absolute: 0 K/uL (ref 0.0–0.1)
Basophils Relative: 0 %
Eosinophils Absolute: 0 K/uL (ref 0.0–0.5)
Eosinophils Relative: 1 %
HCT: 29.8 % — ABNORMAL LOW (ref 39.0–52.0)
Hemoglobin: 10.3 g/dL — ABNORMAL LOW (ref 13.0–17.0)
Immature Granulocytes: 0 %
Lymphocytes Relative: 45 %
Lymphs Abs: 1.7 K/uL (ref 0.7–4.0)
MCH: 34.1 pg — ABNORMAL HIGH (ref 26.0–34.0)
MCHC: 34.6 g/dL (ref 30.0–36.0)
MCV: 98.7 fL (ref 80.0–100.0)
Monocytes Absolute: 0.4 K/uL (ref 0.1–1.0)
Monocytes Relative: 11 %
Neutro Abs: 1.6 K/uL — ABNORMAL LOW (ref 1.7–7.7)
Neutrophils Relative %: 43 %
Platelet Count: 122 K/uL — ABNORMAL LOW (ref 150–400)
RBC: 3.02 MIL/uL — ABNORMAL LOW (ref 4.22–5.81)
RDW: 12.6 % (ref 11.5–15.5)
WBC Count: 3.7 K/uL — ABNORMAL LOW (ref 4.0–10.5)
nRBC: 0 % (ref 0.0–0.2)

## 2024-05-28 LAB — COMPREHENSIVE METABOLIC PANEL WITH GFR
ALT: 12 U/L (ref 0–44)
AST: 15 U/L (ref 15–41)
Albumin: 4.1 g/dL (ref 3.5–5.0)
Alkaline Phosphatase: 78 U/L (ref 38–126)
Anion gap: 4 — ABNORMAL LOW (ref 5–15)
BUN: 11 mg/dL (ref 8–23)
CO2: 27 mmol/L (ref 22–32)
Calcium: 8.9 mg/dL (ref 8.9–10.3)
Chloride: 105 mmol/L (ref 98–111)
Creatinine, Ser: 0.78 mg/dL (ref 0.61–1.24)
GFR, Estimated: >60 mL/min (ref >60)
Glucose, Bld: 93 mg/dL (ref 70–99)
Potassium: 3.9 mmol/L (ref 3.5–5.1)
Sodium: 136 mmol/L (ref 135–145)
Total Bilirubin: 0.5 mg/dL (ref 0.0–1.2)
Total Protein: 7.4 g/dL (ref 6.5–8.1)

## 2024-05-28 MED ORDER — ACETAMINOPHEN 500 MG PO TABS
1000.0000 mg | ORAL_TABLET | Freq: Once | ORAL | Status: AC
  Administered 2024-05-28: 1000 mg via ORAL
  Filled 2024-05-28: qty 2

## 2024-05-28 MED ORDER — DEXAMETHASONE 4 MG PO TABS
8.0000 mg | ORAL_TABLET | Freq: Once | ORAL | Status: AC
  Administered 2024-05-28: 8 mg via ORAL
  Filled 2024-05-28: qty 2

## 2024-05-28 MED ORDER — DEXTROSE 5 % IV SOLN
70.0000 mg/m2 | Freq: Once | INTRAVENOUS | Status: AC
  Administered 2024-05-28: 130 mg via INTRAVENOUS
  Filled 2024-05-28: qty 60

## 2024-05-28 MED ORDER — SODIUM CHLORIDE 0.9 % IV SOLN: INTRAVENOUS | Status: AC

## 2024-05-28 MED ORDER — DIPHENHYDRAMINE HCL 25 MG PO CAPS
50.0000 mg | ORAL_CAPSULE | Freq: Once | ORAL | Status: AC
  Administered 2024-05-28: 50 mg via ORAL
  Filled 2024-05-28: qty 2

## 2024-05-28 NOTE — Patient Instructions (Signed)
 [facility] - A DEPT OF [facility]  Discharge Instructions: Thank you for choosing [facility] to provide your oncology and hematology care.   If you have a lab appointment with the Cancer Center, please go directly to the Cancer Center and check in at the registration area.   Wear comfortable clothing and clothing appropriate for easy access to any Portacath or PICC line.   We strive to give you quality time with your provider. You may need to reschedule your appointment if you arrive late (15 or more minutes).  Arriving late affects you and other patients whose appointments are after yours.  Also, if you miss three or more appointments without notifying the office, you may be dismissed from the clinic at the provider's discretion.      For prescription refill requests, have your pharmacy contact our office and allow 72 hours for refills to be completed.    Today you received the following chemotherapy and/or immunotherapy agents kyprolis      To help prevent nausea and vomiting after your treatment, we encourage you to take your nausea medication as directed.  BELOW ARE SYMPTOMS THAT SHOULD BE REPORTED IMMEDIATELY: *FEVER GREATER THAN 100.4 F (38 C) OR HIGHER *CHILLS OR SWEATING *NAUSEA AND VOMITING THAT IS NOT CONTROLLED WITH YOUR NAUSEA MEDICATION *UNUSUAL SHORTNESS OF BREATH *UNUSUAL BRUISING OR BLEEDING *URINARY PROBLEMS (pain or burning when urinating, or frequent urination) *BOWEL PROBLEMS (unusual diarrhea, constipation, pain near the anus) TENDERNESS IN MOUTH AND THROAT WITH OR WITHOUT PRESENCE OF ULCERS (sore throat, sores in mouth, or a toothache) UNUSUAL RASH, SWELLING OR PAIN  UNUSUAL VAGINAL DISCHARGE OR ITCHING   Items with * indicate a potential emergency and should be followed up as soon as possible or go to the Emergency Department if any problems should occur.  Please show the CHEMOTHERAPY ALERT CARD or IMMUNOTHERAPY  ALERT CARD at check-in to the Emergency Department and triage nurse.  Should you have questions after your visit or need to cancel or reschedule your appointment, please contact [facility] - A DEPT OF [facility]  Dept: [phone]  and follow the prompts.  Office hours are 8:00 a.m. to 4:30 p.m. Monday - Friday. Please note that voicemails left after 4:00 p.m. may not be returned until the following business day.  We are closed weekends and major holidays. You have access to a nurse at all times for urgent questions. Please call the main number to the clinic Dept: [phone] and follow the prompts.   For any non-urgent questions, you may also contact your provider using MyChart. We now offer e-Visits for anyone 80 and older to request care online for non-urgent symptoms. For details visit [email or web address].   Also download the MyChart app! Go to the app store, search "MyChart", open the app, select [place], and log in with your MyChart username and password.
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
## 2024-06-04 ENCOUNTER — Inpatient Hospital Stay

## 2024-06-04 ENCOUNTER — Inpatient Hospital Stay (HOSPITAL_BASED_OUTPATIENT_CLINIC_OR_DEPARTMENT_OTHER): Admitting: Hematology

## 2024-06-04 VITALS — BP 180/80 | HR 75 | Temp 98.0°F | Resp 17 | Ht 73.0 in | Wt 140.0 lb

## 2024-06-04 DIAGNOSIS — C9002 Multiple myeloma in relapse: Secondary | ICD-10-CM | POA: Diagnosis not present

## 2024-06-04 DIAGNOSIS — Z5112 Encounter for antineoplastic immunotherapy: Secondary | ICD-10-CM | POA: Diagnosis not present

## 2024-06-04 DIAGNOSIS — C9001 Multiple myeloma in remission: Secondary | ICD-10-CM

## 2024-06-04 LAB — CBC WITH DIFFERENTIAL (CANCER CENTER ONLY)
Abs Immature Granulocytes: 0.01 K/uL (ref 0.00–0.07)
Basophils Absolute: 0 K/uL (ref 0.0–0.1)
Basophils Relative: 0 %
Eosinophils Absolute: 0 K/uL (ref 0.0–0.5)
Eosinophils Relative: 0 %
HCT: 31.7 % — ABNORMAL LOW (ref 39.0–52.0)
Hemoglobin: 10.7 g/dL — ABNORMAL LOW (ref 13.0–17.0)
Immature Granulocytes: 0 %
Lymphocytes Relative: 36 %
Lymphs Abs: 1.7 K/uL (ref 0.7–4.0)
MCH: 33.8 pg (ref 26.0–34.0)
MCHC: 33.8 g/dL (ref 30.0–36.0)
MCV: 100 fL (ref 80.0–100.0)
Monocytes Absolute: 0.5 K/uL (ref 0.1–1.0)
Monocytes Relative: 11 %
Neutro Abs: 2.5 K/uL (ref 1.7–7.7)
Neutrophils Relative %: 53 %
Platelet Count: 161 K/uL (ref 150–400)
RBC: 3.17 MIL/uL — ABNORMAL LOW (ref 4.22–5.81)
RDW: 12.6 % (ref 11.5–15.5)
WBC Count: 4.8 K/uL (ref 4.0–10.5)
nRBC: 0 % (ref 0.0–0.2)

## 2024-06-04 LAB — COMPREHENSIVE METABOLIC PANEL WITH GFR
ALT: 41 U/L (ref 0–44)
AST: 29 U/L (ref 15–41)
Albumin: 4.5 g/dL (ref 3.5–5.0)
Alkaline Phosphatase: 103 U/L (ref 38–126)
Anion gap: 8 (ref 5–15)
BUN: 20 mg/dL (ref 8–23)
CO2: 25 mmol/L (ref 22–32)
Calcium: 9.4 mg/dL (ref 8.9–10.3)
Chloride: 102 mmol/L (ref 98–111)
Creatinine, Ser: 0.94 mg/dL (ref 0.61–1.24)
GFR, Estimated: >60 mL/min (ref >60)
Glucose, Bld: 124 mg/dL — ABNORMAL HIGH (ref 70–99)
Potassium: 4.3 mmol/L (ref 3.5–5.1)
Sodium: 134 mmol/L — ABNORMAL LOW (ref 135–145)
Total Bilirubin: 0.3 mg/dL (ref 0.0–1.2)
Total Protein: 8.1 g/dL (ref 6.5–8.1)

## 2024-06-04 MED ORDER — DEXTROSE 5 % IV SOLN
70.0000 mg/m2 | Freq: Once | INTRAVENOUS | Status: AC
  Administered 2024-06-04: 130 mg via INTRAVENOUS
  Filled 2024-06-04: qty 60

## 2024-06-04 MED ORDER — ACETAMINOPHEN 500 MG PO TABS
1000.0000 mg | ORAL_TABLET | Freq: Once | ORAL | Status: AC
  Administered 2024-06-04: 1000 mg via ORAL
  Filled 2024-06-04: qty 2

## 2024-06-04 MED ORDER — SODIUM CHLORIDE 0.9 % IV SOLN: INTRAVENOUS | Status: AC

## 2024-06-04 MED ORDER — DIPHENHYDRAMINE HCL 25 MG PO CAPS
50.0000 mg | ORAL_CAPSULE | Freq: Once | ORAL | Status: AC
  Administered 2024-06-04: 50 mg via ORAL
  Filled 2024-06-04: qty 2

## 2024-06-04 MED ORDER — DARATUMUMAB-HYALURONIDASE-FIHJ 1800-30000 MG-UT/15ML ~~LOC~~ SOLN
1800.0000 mg | Freq: Once | SUBCUTANEOUS | Status: AC
  Administered 2024-06-04: 1800 mg via SUBCUTANEOUS
  Filled 2024-06-04: qty 15

## 2024-06-04 MED ORDER — DEXAMETHASONE 4 MG PO TABS
8.0000 mg | ORAL_TABLET | Freq: Once | ORAL | Status: AC
  Administered 2024-06-04: 8 mg via ORAL
  Filled 2024-06-04: qty 2

## 2024-06-04 NOTE — Patient Instructions (Signed)
 [facility] - A DEPT OF [facility]  Discharge Instructions: Thank you for choosing [facility] to provide your oncology and hematology care.   If you have a lab appointment with the Cancer Center, please go directly to the Cancer Center and check in at the registration area.   Wear comfortable clothing and clothing appropriate for easy access to any Portacath or PICC line.   We strive to give you quality time with your provider. You may need to reschedule your appointment if you arrive late (15 or more minutes).  Arriving late affects you and other patients whose appointments are after yours.  Also, if you miss three or more appointments without notifying the office, you may be dismissed from the clinic at the provider's discretion.      For prescription refill requests, have your pharmacy contact our office and allow 72 hours for refills to be completed.    Today you received the following chemotherapy and/or immunotherapy agents kyprolis  and darzalex  faspro      To help prevent nausea and vomiting after your treatment, we encourage you to take your nausea medication as directed.  BELOW ARE SYMPTOMS THAT SHOULD BE REPORTED IMMEDIATELY: *FEVER GREATER THAN 100.4 F (38 C) OR HIGHER *CHILLS OR SWEATING *NAUSEA AND VOMITING THAT IS NOT CONTROLLED WITH YOUR NAUSEA MEDICATION *UNUSUAL SHORTNESS OF BREATH *UNUSUAL BRUISING OR BLEEDING *URINARY PROBLEMS (pain or burning when urinating, or frequent urination) *BOWEL PROBLEMS (unusual diarrhea, constipation, pain near the anus) TENDERNESS IN MOUTH AND THROAT WITH OR WITHOUT PRESENCE OF ULCERS (sore throat, sores in mouth, or a toothache) UNUSUAL RASH, SWELLING OR PAIN  UNUSUAL VAGINAL DISCHARGE OR ITCHING   Items with * indicate a potential emergency and should be followed up as soon as possible or go to the Emergency Department if any problems should occur.  Please show the CHEMOTHERAPY ALERT CARD  or IMMUNOTHERAPY ALERT CARD at check-in to the Emergency Department and triage nurse.  Should you have questions after your visit or need to cancel or reschedule your appointment, please contact [facility]  Dept: [phone]  and follow the prompts.  Office hours are 8:00 a.m. to 4:30 p.m. Monday - Friday. Please note that voicemails left after 4:00 p.m. may not be returned until the following business day.  We are closed weekends and major holidays. You have access to a nurse at all times for urgent questions. Please call the main number to the clinic Dept: [phone] and follow the prompts.   For any non-urgent questions, you may also contact your provider using MyChart. We now offer e-Visits for anyone 40 and older to request care online for non-urgent symptoms. For details visit [email or web address].   Also download the MyChart app! Go to the app store, search MyChart, open the app, select [place], and log in with your MyChart username and password.

## 2024-06-07 ENCOUNTER — Other Ambulatory Visit: Payer: Self-pay

## 2024-06-11 ENCOUNTER — Telehealth: Payer: Self-pay | Admitting: Pharmacist

## 2024-06-11 ENCOUNTER — Encounter: Payer: Self-pay | Admitting: Hematology

## 2024-06-11 ENCOUNTER — Other Ambulatory Visit: Payer: Self-pay

## 2024-06-11 ENCOUNTER — Other Ambulatory Visit (HOSPITAL_COMMUNITY): Payer: Self-pay

## 2024-06-11 ENCOUNTER — Telehealth: Payer: Self-pay

## 2024-06-11 MED ORDER — POMALIDOMIDE 2 MG PO CAPS
2.0000 mg | ORAL_CAPSULE | Freq: Every day | ORAL | 2 refills | Status: DC
  Filled 2024-06-11: qty 21, 21d supply, fill #0

## 2024-06-11 NOTE — Telephone Encounter (Signed)
 Clinical Pharmacist Practitioner Encounter   Received new prescription for Pomalst (pomalidomide) for the treatment of multiple myeloma in conjunction with existing therapy of daratumumab , carfilzomib , and dexamethasone , planned duration until disease progression or unacceptable drug toxicity. Planned start date 06/18/24.   Per MD note, pomalidomide is being adding to patient's existing regimen of daratumumab , carfilzomib , and dexamethasone  due to slowly increasing M-protein. If myeloma progresses dispute pomalidomide addition, MD would consider Car-T or BITE therapy as next line options.   CMP from 06/04/24 assessed, no relevant lab abnormalities. Prescription dose and frequency assessed.   MD instructed patient to begin aspirin 81mg  daily in addition to pomalidomide. Of note, aspirin allergy added to chart in 2018 with the quote Pt stated 'raised BP'.  Current medication list in Epic reviewed, a few DDIs with pomalidomide identified: Methocarbamol  and oxycodone : CNS Depressants such as methocarbamol  and oxycodone  may increase CNS depressant effects of Pomalidomide. Monitoring for CNS depression (eg, fatigue, somnolence).   Evaluated chart and no patient barriers to medication adherence identified.   Oral Oncology Clinic will continue to follow for insurance authorization, copayment issues, initial counseling and start date.   Antavius Sperbeck N. Malvern Kadlec, PharmD, BCOP, CPP Hematology/Oncology Clinical Pharmacist [facility] Oral Chemotherapy Navigation Clinic [phone]  06/11/2024 12:45 PM

## 2024-06-11 NOTE — Telephone Encounter (Signed)
 Oral Oncology Patient Advocate Encounter   Received notification that prior authorization for pomalidomide (POMALYST) 2 MG capsule  is required.   PA submitted on 06/11/24 Key [id number] Status is pending     Lucie Lamer, CPhT [place]  [facility] Oncology Pharmacy Patient Advocate Specialist [facility] [place] Phone: [phone]  Fax: [phone] [email or web address]

## 2024-06-11 NOTE — Telephone Encounter (Addendum)
 Oral Oncology Patient Advocate Encounter  Prior Authorization for pomalidomide (POMALYST) 2 MG capsule  has been approved.    PA# [id number] Effective dates: 06/11/24 through 07/14/25  Patients co-pay is $0.00.  Patient must get at [facility].  Lucie Lamer, CPhT [place]  [facility] Oncology Pharmacy Patient Advocate Specialist [facility] [place] Phone: [phone]  Fax: [phone] [email or web address]

## 2024-06-14 ENCOUNTER — Telehealth: Payer: Self-pay

## 2024-06-14 DIAGNOSIS — C9002 Multiple myeloma in relapse: Secondary | ICD-10-CM

## 2024-06-17 ENCOUNTER — Other Ambulatory Visit: Payer: Self-pay

## 2024-06-17 DIAGNOSIS — C9002 Multiple myeloma in relapse: Secondary | ICD-10-CM

## 2024-06-18 ENCOUNTER — Inpatient Hospital Stay: Attending: Oncology

## 2024-06-18 ENCOUNTER — Other Ambulatory Visit: Payer: Self-pay | Admitting: Hematology

## 2024-06-18 ENCOUNTER — Inpatient Hospital Stay

## 2024-06-18 ENCOUNTER — Encounter: Payer: Self-pay | Admitting: Hematology

## 2024-06-18 VITALS — BP 184/84 | HR 64 | Temp 97.5°F | Resp 16

## 2024-06-18 DIAGNOSIS — Z87891 Personal history of nicotine dependence: Secondary | ICD-10-CM | POA: Diagnosis not present

## 2024-06-18 DIAGNOSIS — C9000 Multiple myeloma not having achieved remission: Secondary | ICD-10-CM | POA: Insufficient documentation

## 2024-06-18 DIAGNOSIS — Z8042 Family history of malignant neoplasm of prostate: Secondary | ICD-10-CM | POA: Diagnosis not present

## 2024-06-18 DIAGNOSIS — M47814 Spondylosis without myelopathy or radiculopathy, thoracic region: Secondary | ICD-10-CM | POA: Insufficient documentation

## 2024-06-18 DIAGNOSIS — Z79624 Long term (current) use of inhibitors of nucleotide synthesis: Secondary | ICD-10-CM | POA: Insufficient documentation

## 2024-06-18 DIAGNOSIS — Z5112 Encounter for antineoplastic immunotherapy: Secondary | ICD-10-CM | POA: Diagnosis present

## 2024-06-18 DIAGNOSIS — Z9079 Acquired absence of other genital organ(s): Secondary | ICD-10-CM | POA: Insufficient documentation

## 2024-06-18 DIAGNOSIS — Z886 Allergy status to analgesic agent status: Secondary | ICD-10-CM | POA: Insufficient documentation

## 2024-06-18 DIAGNOSIS — C9001 Multiple myeloma in remission: Secondary | ICD-10-CM

## 2024-06-18 DIAGNOSIS — Z85820 Personal history of malignant melanoma of skin: Secondary | ICD-10-CM | POA: Insufficient documentation

## 2024-06-18 DIAGNOSIS — Z8249 Family history of ischemic heart disease and other diseases of the circulatory system: Secondary | ICD-10-CM | POA: Diagnosis not present

## 2024-06-18 DIAGNOSIS — Z8701 Personal history of pneumonia (recurrent): Secondary | ICD-10-CM | POA: Insufficient documentation

## 2024-06-18 DIAGNOSIS — Z7961 Long term (current) use of immunomodulator: Secondary | ICD-10-CM | POA: Diagnosis not present

## 2024-06-18 DIAGNOSIS — C61 Malignant neoplasm of prostate: Secondary | ICD-10-CM | POA: Insufficient documentation

## 2024-06-18 DIAGNOSIS — Z79891 Long term (current) use of opiate analgesic: Secondary | ICD-10-CM | POA: Diagnosis not present

## 2024-06-18 DIAGNOSIS — Z7982 Long term (current) use of aspirin: Secondary | ICD-10-CM | POA: Insufficient documentation

## 2024-06-18 DIAGNOSIS — Z79899 Other long term (current) drug therapy: Secondary | ICD-10-CM | POA: Diagnosis not present

## 2024-06-18 DIAGNOSIS — Z8616 Personal history of COVID-19: Secondary | ICD-10-CM | POA: Insufficient documentation

## 2024-06-18 DIAGNOSIS — Z9049 Acquired absence of other specified parts of digestive tract: Secondary | ICD-10-CM | POA: Insufficient documentation

## 2024-06-18 DIAGNOSIS — K5900 Constipation, unspecified: Secondary | ICD-10-CM | POA: Diagnosis not present

## 2024-06-18 DIAGNOSIS — I10 Essential (primary) hypertension: Secondary | ICD-10-CM | POA: Diagnosis not present

## 2024-06-18 DIAGNOSIS — C9002 Multiple myeloma in relapse: Secondary | ICD-10-CM | POA: Diagnosis present

## 2024-06-18 LAB — CBC WITH DIFFERENTIAL (CANCER CENTER ONLY)
Abs Immature Granulocytes: 0.01 K/uL (ref 0.00–0.07)
Basophils Absolute: 0 K/uL (ref 0.0–0.1)
Basophils Relative: 0 %
Eosinophils Absolute: 0 K/uL (ref 0.0–0.5)
Eosinophils Relative: 0 %
HCT: 29.5 % — ABNORMAL LOW (ref 39.0–52.0)
Hemoglobin: 10.1 g/dL — ABNORMAL LOW (ref 13.0–17.0)
Immature Granulocytes: 0 %
Lymphocytes Relative: 28 %
Lymphs Abs: 1.4 K/uL (ref 0.7–4.0)
MCH: 33.6 pg (ref 26.0–34.0)
MCHC: 34.2 g/dL (ref 30.0–36.0)
MCV: 98 fL (ref 80.0–100.0)
Monocytes Absolute: 0.2 K/uL (ref 0.1–1.0)
Monocytes Relative: 5 %
Neutro Abs: 3.2 K/uL (ref 1.7–7.7)
Neutrophils Relative %: 67 %
Platelet Count: 270 K/uL (ref 150–400)
RBC: 3.01 MIL/uL — ABNORMAL LOW (ref 4.22–5.81)
RDW: 13.2 % (ref 11.5–15.5)
WBC Count: 4.8 K/uL (ref 4.0–10.5)
nRBC: 0 % (ref 0.0–0.2)

## 2024-06-18 LAB — COMPREHENSIVE METABOLIC PANEL WITH GFR
ALT: 12 U/L (ref 0–44)
AST: 22 U/L (ref 15–41)
Albumin: 4.3 g/dL (ref 3.5–5.0)
Alkaline Phosphatase: 95 U/L (ref 38–126)
Anion gap: 8 (ref 5–15)
BUN: 11 mg/dL (ref 8–23)
CO2: 24 mmol/L (ref 22–32)
Calcium: 9.1 mg/dL (ref 8.9–10.3)
Chloride: 103 mmol/L (ref 98–111)
Creatinine, Ser: 0.74 mg/dL (ref 0.61–1.24)
GFR, Estimated: >60 mL/min (ref >60)
Glucose, Bld: 91 mg/dL (ref 70–99)
Potassium: 4.1 mmol/L (ref 3.5–5.1)
Sodium: 136 mmol/L (ref 135–145)
Total Bilirubin: 0.4 mg/dL (ref 0.0–1.2)
Total Protein: 7.9 g/dL (ref 6.5–8.1)

## 2024-06-18 MED ORDER — DIPHENHYDRAMINE HCL 25 MG PO CAPS
50.0000 mg | ORAL_CAPSULE | Freq: Once | ORAL | Status: AC
  Administered 2024-06-18: 50 mg via ORAL
  Filled 2024-06-18: qty 2

## 2024-06-18 MED ORDER — DEXTROSE 5 % IV SOLN: 36.0000 mg/m2 | Freq: Once | INTRAVENOUS | Status: DC

## 2024-06-18 MED ORDER — DEXAMETHASONE 4 MG PO TABS
8.0000 mg | ORAL_TABLET | Freq: Once | ORAL | Status: AC
  Administered 2024-06-18: 8 mg via ORAL
  Filled 2024-06-18: qty 2

## 2024-06-18 MED ORDER — ACETAMINOPHEN 500 MG PO TABS
1000.0000 mg | ORAL_TABLET | Freq: Once | ORAL | Status: AC
  Administered 2024-06-18: 1000 mg via ORAL
  Filled 2024-06-18: qty 2

## 2024-06-18 MED ORDER — DEXTROSE 5 % IV SOLN
70.0000 mg/m2 | Freq: Once | INTRAVENOUS | Status: AC
  Administered 2024-06-18: 130 mg via INTRAVENOUS
  Filled 2024-06-18: qty 60

## 2024-06-18 MED ORDER — DARATUMUMAB-HYALURONIDASE-FIHJ 1800-30000 MG-UT/15ML ~~LOC~~ SOLN
1800.0000 mg | Freq: Once | SUBCUTANEOUS | Status: AC
  Administered 2024-06-18: 1800 mg via SUBCUTANEOUS
  Filled 2024-06-18: qty 15

## 2024-06-18 MED ORDER — SODIUM CHLORIDE 0.9 % IV SOLN: INTRAVENOUS | Status: AC

## 2024-06-18 NOTE — Patient Instructions (Signed)
 [facility] - A DEPT OF [facility]  Discharge Instructions: Thank you for choosing [facility] to provide your oncology and hematology care.   If you have a lab appointment with the Cancer Center, please go directly to the Cancer Center and check in at the registration area.   Wear comfortable clothing and clothing appropriate for easy access to any Portacath or PICC line.   We strive to give you quality time with your provider. You may need to reschedule your appointment if you arrive late (15 or more minutes).  Arriving late affects you and other patients whose appointments are after yours.  Also, if you miss three or more appointments without notifying the office, you may be dismissed from the clinic at the provider's discretion.      For prescription refill requests, have your pharmacy contact our office and allow 72 hours for refills to be completed.    Today you received the following chemotherapy and/or immunotherapy agents kyprolis  and darzalex  faspro      To help prevent nausea and vomiting after your treatment, we encourage you to take your nausea medication as directed.  BELOW ARE SYMPTOMS THAT SHOULD BE REPORTED IMMEDIATELY: *FEVER GREATER THAN 100.4 F (38 C) OR HIGHER *CHILLS OR SWEATING *NAUSEA AND VOMITING THAT IS NOT CONTROLLED WITH YOUR NAUSEA MEDICATION *UNUSUAL SHORTNESS OF BREATH *UNUSUAL BRUISING OR BLEEDING *URINARY PROBLEMS (pain or burning when urinating, or frequent urination) *BOWEL PROBLEMS (unusual diarrhea, constipation, pain near the anus) TENDERNESS IN MOUTH AND THROAT WITH OR WITHOUT PRESENCE OF ULCERS (sore throat, sores in mouth, or a toothache) UNUSUAL RASH, SWELLING OR PAIN  UNUSUAL VAGINAL DISCHARGE OR ITCHING   Items with * indicate a potential emergency and should be followed up as soon as possible or go to the Emergency Department if any problems should occur.  Please show the CHEMOTHERAPY ALERT CARD  or IMMUNOTHERAPY ALERT CARD at check-in to the Emergency Department and triage nurse.  Should you have questions after your visit or need to cancel or reschedule your appointment, please contact [facility]  Dept: [phone]  and follow the prompts.  Office hours are 8:00 a.m. to 4:30 p.m. Monday - Friday. Please note that voicemails left after 4:00 p.m. may not be returned until the following business day.  We are closed weekends and major holidays. You have access to a nurse at all times for urgent questions. Please call the main number to the clinic Dept: [phone] and follow the prompts.   For any non-urgent questions, you may also contact your provider using MyChart. We now offer e-Visits for anyone 40 and older to request care online for non-urgent symptoms. For details visit [email or web address].   Also download the MyChart app! Go to the app store, search MyChart, open the app, select [place], and log in with your MyChart username and password.

## 2024-06-18 NOTE — Progress Notes (Signed)
 Continue Kyprolis  at 70 mg/m2 today and for future doses per Dr. Onesimo.  Harlene Nasuti, PharmD Oncology Infusion Pharmacist 06/18/2024 1:31 PM

## 2024-06-19 ENCOUNTER — Other Ambulatory Visit (HOSPITAL_COMMUNITY): Payer: Self-pay

## 2024-06-20 ENCOUNTER — Other Ambulatory Visit: Payer: Self-pay

## 2024-06-21 ENCOUNTER — Other Ambulatory Visit: Payer: Self-pay

## 2024-06-21 DIAGNOSIS — C9002 Multiple myeloma in relapse: Secondary | ICD-10-CM

## 2024-06-21 MED ORDER — POMALIDOMIDE 2 MG PO CAPS: 2.0000 mg | ORAL_CAPSULE | Freq: Every day | ORAL | 0 refills | Status: DC

## 2024-06-22 ENCOUNTER — Other Ambulatory Visit (HOSPITAL_COMMUNITY): Payer: Self-pay

## 2024-06-22 LAB — MULTIPLE MYELOMA PANEL, SERUM
Albumin SerPl Elph-Mcnc: 3.6 g/dL (ref 2.9–4.4)
Albumin/Glob SerPl: 1 (ref 0.7–1.7)
Alpha 1: 0.3 g/dL (ref 0.0–0.4)
Alpha2 Glob SerPl Elph-Mcnc: 0.8 g/dL (ref 0.4–1.0)
B-Globulin SerPl Elph-Mcnc: 1 g/dL (ref 0.7–1.3)
Gamma Glob SerPl Elph-Mcnc: 1.7 g/dL (ref 0.4–1.8)
Globulin, Total: 3.8 g/dL (ref 2.2–3.9)
IgA: 12 mg/dL — ABNORMAL LOW (ref 61–437)
IgG (Immunoglobin G), Serum: 1916 mg/dL — ABNORMAL HIGH (ref 603–1613)
IgM (Immunoglobulin M), Srm: 7 mg/dL — ABNORMAL LOW (ref 15–143)
M Protein SerPl Elph-Mcnc: 1.5 g/dL — ABNORMAL HIGH
Total Protein ELP: 7.4 g/dL (ref 6.0–8.5)

## 2024-06-22 MED ORDER — OXYCODONE-ACETAMINOPHEN 10-325 MG PO TABS
1.0000 | ORAL_TABLET | Freq: Two times a day (BID) | ORAL | 0 refills | Status: AC | PRN
  Filled 2024-06-27: qty 60, 30d supply, fill #0
  Filled ????-??-??: fill #0

## 2024-06-22 NOTE — Telephone Encounter (Addendum)
 Oral Chemotherapy Pharmacist Encounter  I spoke with patient for overview of: Pomalyst  (pomalidomide ) for the treatment of relapsed multiple myeloma in conjunction with daratumumab  and dexamethasone , planned duration until disease progression or unacceptable toxicity.   Treatment goal: Palliative  Counseled patient on administration, dosing, side effects, monitoring, drug-food interactions, safe handling, storage, and disposal.  Patient will take Pomalyst  2mg  capsules, 1 capsule by mouth once daily, without regard to food, with a full glass of water . Pomalyst  will be given 21 days on, 7 days off, repeat every 28 days.  Pomalyst  start date: pending shipment from [facility] - patient aware to be on the look out of a phone call from [employer]  Adverse effects of Pomalyst  include but are not limited to: nausea, constipation, diarrhea, abdominal pain, rash, fatigue, drug fever, peripheral edema, and decreased blood counts.    Reviewed with patient importance of keeping a medication schedule and plan for any missed doses. No barriers to medication adherence identified.  Medication reconciliation performed and medication/allergy list updated. Patient aware of medications that interact.  Patient is already on acyclovir  and states he will start a baby aspirin.  Patient counseled on importance of daily aspirin 81mg  for VTE prophylaxis. Patient will notify MD and RN if he is having any increased blood pressure as documented on patients med allergies from the baby aspirin.   Distress thermometer not completed during telephone call as patient has been on previous lines of therapy.   Communication and Learning Assessment Primary learner: Patient Barriers to learning: No barriers Preferred language: English Learning preferences: Listening Reading  All questions answered. Patient voiced understanding and appreciation. Medication education handout placed in mail for patient. Patient knows to  call the office with questions or concerns. Oral Chemotherapy Clinic phone number provided to patient.   Ned Kakar, PharmD Hematology/Oncology Clinical Pharmacist [place] Oral Chemotherapy Navigation Clinic [phone] 06/22/2024   11:35 AM

## 2024-06-23 ENCOUNTER — Other Ambulatory Visit (HOSPITAL_COMMUNITY): Payer: Self-pay

## 2024-06-24 ENCOUNTER — Other Ambulatory Visit (HOSPITAL_COMMUNITY): Payer: Self-pay

## 2024-06-25 ENCOUNTER — Inpatient Hospital Stay

## 2024-06-25 VITALS — BP 160/95 | HR 72 | Temp 98.3°F | Resp 16 | Wt 142.8 lb

## 2024-06-25 DIAGNOSIS — C9001 Multiple myeloma in remission: Secondary | ICD-10-CM

## 2024-06-25 DIAGNOSIS — Z5112 Encounter for antineoplastic immunotherapy: Secondary | ICD-10-CM | POA: Diagnosis not present

## 2024-06-25 DIAGNOSIS — C9002 Multiple myeloma in relapse: Secondary | ICD-10-CM

## 2024-06-25 LAB — COMPREHENSIVE METABOLIC PANEL WITH GFR
ALT: 13 U/L (ref 0–44)
AST: 20 U/L (ref 15–41)
Albumin: 4.1 g/dL (ref 3.5–5.0)
Alkaline Phosphatase: 84 U/L (ref 38–126)
Anion gap: 9 (ref 5–15)
BUN: 12 mg/dL (ref 8–23)
CO2: 24 mmol/L (ref 22–32)
Calcium: 9.5 mg/dL (ref 8.9–10.3)
Chloride: 104 mmol/L (ref 98–111)
Creatinine, Ser: 0.77 mg/dL (ref 0.61–1.24)
GFR, Estimated: >60 mL/min (ref >60)
Glucose, Bld: 95 mg/dL (ref 70–99)
Potassium: 3.7 mmol/L (ref 3.5–5.1)
Sodium: 137 mmol/L (ref 135–145)
Total Bilirubin: 0.4 mg/dL (ref 0.0–1.2)
Total Protein: 7.8 g/dL (ref 6.5–8.1)

## 2024-06-25 LAB — CBC WITH DIFFERENTIAL (CANCER CENTER ONLY)
Abs Immature Granulocytes: 0.02 K/uL (ref 0.00–0.07)
Basophils Absolute: 0 K/uL (ref 0.0–0.1)
Basophils Relative: 0 %
Eosinophils Absolute: 0.1 K/uL (ref 0.0–0.5)
Eosinophils Relative: 1 %
HCT: 27.9 % — ABNORMAL LOW (ref 39.0–52.0)
Hemoglobin: 9.7 g/dL — ABNORMAL LOW (ref 13.0–17.0)
Immature Granulocytes: 0 %
Lymphocytes Relative: 25 %
Lymphs Abs: 1.5 K/uL (ref 0.7–4.0)
MCH: 33.4 pg (ref 26.0–34.0)
MCHC: 34.8 g/dL (ref 30.0–36.0)
MCV: 96.2 fL (ref 80.0–100.0)
Monocytes Absolute: 0.7 K/uL (ref 0.1–1.0)
Monocytes Relative: 11 %
Neutro Abs: 3.9 K/uL (ref 1.7–7.7)
Neutrophils Relative %: 63 %
Platelet Count: 121 K/uL — ABNORMAL LOW (ref 150–400)
RBC: 2.9 MIL/uL — ABNORMAL LOW (ref 4.22–5.81)
RDW: 13.1 % (ref 11.5–15.5)
WBC Count: 6.2 K/uL (ref 4.0–10.5)
nRBC: 0 % (ref 0.0–0.2)

## 2024-06-25 MED ORDER — SODIUM CHLORIDE 0.9 % IV SOLN: INTRAVENOUS | Status: AC

## 2024-06-25 MED ORDER — DEXTROSE 5 % IV SOLN
70.0000 mg/m2 | Freq: Once | INTRAVENOUS | Status: AC
  Administered 2024-06-25: 130 mg via INTRAVENOUS
  Filled 2024-06-25: qty 60

## 2024-06-25 MED ORDER — DIPHENHYDRAMINE HCL 25 MG PO CAPS
50.0000 mg | ORAL_CAPSULE | Freq: Once | ORAL | Status: AC
  Administered 2024-06-25: 50 mg via ORAL
  Filled 2024-06-25: qty 2

## 2024-06-25 MED ORDER — ACETAMINOPHEN 500 MG PO TABS
1000.0000 mg | ORAL_TABLET | Freq: Once | ORAL | Status: AC
  Administered 2024-06-25: 1000 mg via ORAL
  Filled 2024-06-25: qty 2

## 2024-06-25 MED ORDER — DEXAMETHASONE 4 MG PO TABS
8.0000 mg | ORAL_TABLET | Freq: Once | ORAL | Status: AC
  Administered 2024-06-25: 8 mg via ORAL
  Filled 2024-06-25: qty 2

## 2024-06-25 NOTE — Patient Instructions (Signed)
 [facility] - A DEPT OF [facility]   Discharge Instructions: Thank you for choosing [facility] to provide your oncology and hematology care.   If you have a lab appointment with the Cancer Center, please go directly to the Cancer Center and check in at the registration area.   Wear comfortable clothing and clothing appropriate for easy access to any Portacath or PICC line.   We strive to give you quality time with your provider. You may need to reschedule your appointment if you arrive late (15 or more minutes).  Arriving late affects you and other patients whose appointments are after yours.  Also, if you miss three or more appointments without notifying the office, you may be dismissed from the clinic at the provider's discretion.      For prescription refill requests, have your pharmacy contact our office and allow 72 hours for refills to be completed.    Today you received the following chemotherapy and/or immunotherapy agents: Carfilzomib (Kyprolis)      To help prevent nausea and vomiting after your treatment, we encourage you to take your nausea medication as directed.  BELOW ARE SYMPTOMS THAT SHOULD BE REPORTED IMMEDIATELY: *FEVER GREATER THAN 100.4 F (38 C) OR HIGHER *CHILLS OR SWEATING *NAUSEA AND VOMITING THAT IS NOT CONTROLLED WITH YOUR NAUSEA MEDICATION *UNUSUAL SHORTNESS OF BREATH *UNUSUAL BRUISING OR BLEEDING *URINARY PROBLEMS (pain or burning when urinating, or frequent urination) *BOWEL PROBLEMS (unusual diarrhea, constipation, pain near the anus) TENDERNESS IN MOUTH AND THROAT WITH OR WITHOUT PRESENCE OF ULCERS (sore throat, sores in mouth, or a toothache) UNUSUAL RASH, SWELLING OR PAIN  UNUSUAL VAGINAL DISCHARGE OR ITCHING   Items with * indicate a potential emergency and should be followed up as soon as possible or go to the Emergency Department if any problems should occur.  Please show the CHEMOTHERAPY ALERT CARD or  IMMUNOTHERAPY ALERT CARD at check-in to the Emergency Department and triage nurse.  Should you have questions after your visit or need to cancel or reschedule your appointment, please contact [facility] - A DEPT OF [facility]  Dept: [phone]  and follow the prompts.  Office hours are 8:00 a.m. to 4:30 p.m. Monday - Friday. Please note that voicemails left after 4:00 p.m. may not be returned until the following business day.  We are closed weekends and major holidays. You have access to a nurse at all times for urgent questions. Please call the main number to the clinic Dept: [phone] and follow the prompts.   For any non-urgent questions, you may also contact your provider using MyChart. We now offer e-Visits for anyone 55 and older to request care online for non-urgent symptoms. For details visit [email or web address].   Also download the MyChart app! Go to the app store, search "MyChart", open the app, select [place], and log in with your MyChart username and password.

## 2024-06-27 ENCOUNTER — Encounter (HOSPITAL_COMMUNITY): Payer: Self-pay

## 2024-06-27 ENCOUNTER — Other Ambulatory Visit: Payer: Self-pay

## 2024-06-27 ENCOUNTER — Emergency Department (HOSPITAL_COMMUNITY)
Admission: EM | Admit: 2024-06-27 | Discharge: 2024-06-27 | Disposition: A | Discharge status: Home / Self Care | Attending: Emergency Medicine | Admitting: Emergency Medicine

## 2024-06-27 ENCOUNTER — Other Ambulatory Visit (HOSPITAL_COMMUNITY): Payer: Self-pay

## 2024-06-27 ENCOUNTER — Emergency Department (HOSPITAL_COMMUNITY)

## 2024-06-27 DIAGNOSIS — R0981 Nasal congestion: Secondary | ICD-10-CM | POA: Insufficient documentation

## 2024-06-27 DIAGNOSIS — Z7982 Long term (current) use of aspirin: Secondary | ICD-10-CM | POA: Insufficient documentation

## 2024-06-27 DIAGNOSIS — R053 Chronic cough: Secondary | ICD-10-CM | POA: Insufficient documentation

## 2024-06-27 LAB — RESP PANEL BY RT-PCR (RSV, FLU A&B, COVID)  RVPGX2
Influenza A by PCR: NEGATIVE
Influenza B by PCR: NEGATIVE
Resp Syncytial Virus by PCR: NEGATIVE
SARS Coronavirus 2 by RT PCR: NEGATIVE

## 2024-06-27 NOTE — ED Triage Notes (Signed)
 Patient states he's had a cold for the past month and he wants to make sure nothing else sis wrong with him. Denies shob, fever, chills, chest pain. Reports nasal congestion and coughing up phlegm,

## 2024-06-27 NOTE — ED Provider Notes (Signed)
 [facility] DEPARTMENT AT [facility] Provider Note   CSN: [id number] Arrival date & time: 06/27/24  1528     Patient presents with: URI   Travis Nguyen is a 73 y.o. male.  Patient is a 73 year old male with a history of multiple myeloma in remission who presents to the ED for increasing cough and congestion for the past month.  Patient notes he is having a lot of mucus when he coughs as well as running out of his nose.  He states he has tried over-the-counter medications with minimal relief.  Notes he is on multiple myeloma maintenance treatment at this time and has infusions once a week.  States he is scheduled to see his oncologist this coming Friday.  Denies fevers, chills, headache, chest pain, shortness of breath, nausea/vomit/diarrhea.  No further complaints.    URI Presenting symptoms: congestion and cough   Presenting symptoms: no fever   Associated symptoms: no headaches        Prior to Admission medications  Medication Sig Start Date End Date Taking? Authorizing Provider  acyclovir  (ZOVIRAX ) 400 MG tablet Take 1 tablet (400 mg total) by mouth 2 (two) times daily. 11/28/23   Onesimo Emaline Brink, MD  amLODipine  (NORVASC ) 5 MG tablet Take 1 tablet (5 mg total) by mouth daily. 02/27/24     aspirin EC 81 MG tablet Take 81 mg by mouth daily. Swallow whole.    [provider]  B Complex-C (B-COMPLEX WITH VITAMIN C) tablet Take 1 tablet by mouth daily.    [provider]  Cyanocobalamin  (VITAMIN B12 PO) Take 1 tablet by mouth daily.    [provider]  lidocaine  (LIDODERM ) 5 % Place 1 patch onto the skin daily. Remove & Discard patch within 12 hours or as directed by MD 03/13/24   Hoy Nidia FALCON, PA-C  lidocaine -prilocaine  (EMLA ) cream APPLY TOPICALLY TO PORT-A-CATH DAILY AS NEEDED 04/08/24 04/08/25  Onesimo Emaline Brink, MD  losartan  (COZAAR ) 100 MG tablet Take 1 tablet (100 mg total) by mouth daily. 02/27/24      methocarbamol  (ROBAXIN ) 500 MG tablet Take 1 tablet (500 mg total) by mouth every 6 (six) hours as needed for muscle spasms. 03/16/24   Raford Lenis, MD  oxyCODONE -acetaminophen  (PERCOCET ) 10-325 MG tablet Take 1 tablet by mouth 2 times a day as needed orally (ok to fill 30 days after last refill) 02/25/22     oxyCODONE -acetaminophen  (PERCOCET ) 10-325 MG tablet Take 1 tablet by mouth 2 (two) times daily. 12/22/23     oxyCODONE -acetaminophen  (PERCOCET ) 10-325 MG tablet Take 1 tablet by mouth 2 (two) times daily as needed may fill 30 days after last script 03/25/24     oxyCODONE -acetaminophen  (PERCOCET ) 10-325 MG tablet Take 1 tablet by mouth 2 (two) times daily. 04/22/24     oxyCODONE -acetaminophen  (PERCOCET ) 10-325 MG tablet Take 1 tablet by mouth 2 (two) times daily as needed. 06/22/24     pantoprazole  (PROTONIX ) 40 MG tablet Take 1 tablet (40 mg total) by mouth every morning 1/2-1 hour before breakfast. Patient not taking: Reported on 04/02/2024 02/27/24     polyethylene glycol powder (MIRALAX ) 17 GM/SCOOP powder Take 17 g by mouth 2 (two) times daily. Dissolve 1 capful (17g) in 4-8 ounces of liquid and take by mouth twice daily. 05/04/24   Randol Simmonds, MD  pomalidomide  (POMALYST ) 2 MG capsule Take 1 capsule (2 mg total) by mouth daily. Take 1 capsule (2 mg total)  by mouth  daily for 21 days. Take  7 days off . Repeat cycle 06/21/24   Kale, [name], MD    Allergies: Aspirin    Review of Systems  Constitutional:  Negative for chills and fever.  HENT:  Positive for congestion.   Respiratory:  Positive for cough. Negative for shortness of breath.   Cardiovascular:  Negative for chest pain.  Neurological:  Negative for headaches.  All other systems reviewed and are negative.   Updated Vital Signs BP (!) 176/94 (BP Location: Left Arm)   Pulse 62   Temp 98.5 F (36.9 C) (Oral)   Resp 19   Ht 6' 1 (1.854 m)   Wt 69.9 kg   SpO2 99%   BMI 20.32 kg/m   Physical Exam Constitutional:       Appearance: Normal appearance.  HENT:     Head: Normocephalic and atraumatic.     Nose: Congestion present.     Mouth/Throat:     Mouth: Mucous membranes are moist.     Pharynx: Oropharynx is clear.  Cardiovascular:     Rate and Rhythm: Normal rate.  Pulmonary:     Effort: Pulmonary effort is normal.     Breath sounds: Normal breath sounds.  Skin:    General: Skin is warm and dry.  Neurological:     Mental Status: He is alert and oriented to person, place, and time.  Psychiatric:        Mood and Affect: Mood normal.        Behavior: Behavior normal.     (all labs ordered are listed, but only abnormal results are displayed) Labs Reviewed  RESP PANEL BY RT-PCR (RSV, FLU A&B, COVID)  [id number]    EKG: EKG Interpretation Date/Time:  Sunday June 27 2024 18:00:29 EST Ventricular Rate:  64 PR Interval:  180 QRS Duration:  104 QT Interval:  412 QTC Calculation: 425 R Axis:   57  Text Interpretation: Normal sinus rhythm Moderate voltage criteria for LVH, may be normal variant ( Sokolow-Lyon , Cornell product ) When compared with ECG of 04-May-2024 10:22, PREVIOUS ECG IS PRESENT Confirmed by Cottie Cough [phone]) on 06/27/2024 6:05:58 PM  Radiology: [facility] Chest 2 View Result Date: 06/27/2024 CLINICAL DATA:  coughing up phlegm EXAM: DG CHEST 2V COMPARISON:  January 05, 2024 FINDINGS: The cardiomediastinal silhouette is unchanged in contour.RIGHT chest port with tip terminating over the superior cavoatrial junction. No pleural effusion. No pneumothorax. No acute pleuroparenchymal abnormality. Visualized abdomen is unremarkable. Mild degenerative changes of the thoracic spine. IMPRESSION: No acute cardiopulmonary abnormality. Electronically Signed   By: Corean Salter M.D.   On: 06/27/2024 17:04      Medications Ordered in the ED - No data to display                                 Medical Decision Making Patient is a 73 year old male with a history of multiple myeloma in  remission who presents to the ED for increasing cough and congestion for the past month.  Please see detailed HPI above.  On exam patient is alert and well-appearing.  Physical exam as noted above.  Lungs are clear to auscultation and no acute respiratory distress.  Vital signs stable with oxygen 99% on room air.  Chest x-ray reviewed that shows no acute consolidation or process today.  Negative flu/COVID/RSV.  EKG reviewed that shows sinus rhythm similar to previous.  Labs from 2 days prior at oncology reviewed that  showed no neutropenia or leukocytosis.  Platelets mildly decreased but otherwise no acute abnormalities.  Differential includes acute viral illness, bronchitis, pneumonia, PE, COPD, lung mass.  Less concerns for acute PE as vital signs are stable and no associated symptoms including hemoptysis or shortness of breath noted.  Less concerns for emergent workup as this is been ongoing for over a month and he is followed closely by oncology.  He has weekly infusions every week and is scheduled to see his oncologist this coming Friday.  Patient is well-appearing and stable for discharge home today.  Advised to follow-up with oncologist as scheduled this Friday and PCP within the next week as well for further evaluation and management.  Return precautions provided.       Final diagnoses:  Chronic cough    ED Discharge Orders     None          Neysa Thersia GORMAN DEVONNA 06/27/24 1829    Cottie Donnice PARAS, MD 06/27/24 2153

## 2024-06-27 NOTE — Discharge Instructions (Addendum)
 Please follow-up with primary care doctor in 1 week for further evaluation and management of cough.  May also mention this to the oncologist at your appointment Friday.  Return to ED sooner if any symptoms worsen including severe chest pain, shortness of breath, difficulty breathing.

## 2024-06-27 NOTE — ED Provider Triage Note (Signed)
 Emergency Medicine Provider Triage Evaluation Note  Travis Nguyen , a 73 y.o. male  was evaluated in triage.  Pt complains of cough that has been persisting for about a month.  Patient states he has been coughing up sputum and has concerns that he does not want this to develop into pneumonia.  He denies any fevers, chills, body aches.  He denies any congestion or sore throat.  No history of CHF, asthma, COPD.  Review of Systems  Positive: Cough Negative: Fever, chills, body aches  Physical Exam  BP (!) 171/82   Pulse 73   Temp 98.2 F (36.8 C)   Resp 14   SpO2 99%  Gen:   Awake, no distress   Resp:  Normal effort, lung sounds clear bilaterally MSK:   Moves extremities without difficulty  Other:    Medical Decision Making  Medically screening exam initiated at 3:40 PM.  Appropriate orders placed.  Travis Nguyen was informed that the remainder of the evaluation will be completed by another provider, this initial triage assessment does not replace that evaluation, and the importance of remaining in the ED until their evaluation is complete.  Chest x-ray and respiratory panel ordered.   Travis Marry RAMAN, PA-C 06/27/24 1541

## 2024-06-29 ENCOUNTER — Other Ambulatory Visit (HOSPITAL_COMMUNITY): Payer: Self-pay

## 2024-06-29 LAB — MULTIPLE MYELOMA PANEL, SERUM
Albumin SerPl Elph-Mcnc: 3.6 g/dL (ref 2.9–4.4)
Albumin/Glob SerPl: 1 (ref 0.7–1.7)
Alpha 1: 0.3 g/dL (ref 0.0–0.4)
Alpha2 Glob SerPl Elph-Mcnc: 0.8 g/dL (ref 0.4–1.0)
B-Globulin SerPl Elph-Mcnc: 1 g/dL (ref 0.7–1.3)
Gamma Glob SerPl Elph-Mcnc: 1.8 g/dL (ref 0.4–1.8)
Globulin, Total: 3.8 g/dL (ref 2.2–3.9)
IgA: 11 mg/dL — ABNORMAL LOW (ref 61–437)
IgG (Immunoglobin G), Serum: 1928 mg/dL — ABNORMAL HIGH (ref 603–1613)
IgM (Immunoglobulin M), Srm: 7 mg/dL — ABNORMAL LOW (ref 15–143)
M Protein SerPl Elph-Mcnc: 1.5 g/dL — ABNORMAL HIGH
Total Protein ELP: 7.4 g/dL (ref 6.0–8.5)

## 2024-06-30 ENCOUNTER — Other Ambulatory Visit: Payer: Self-pay
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
## 2024-07-02 ENCOUNTER — Inpatient Hospital Stay: Admitting: Physician Assistant

## 2024-07-02 ENCOUNTER — Inpatient Hospital Stay

## 2024-07-02 VITALS — BP 176/82

## 2024-07-02 VITALS — BP 181/92 | HR 61 | Temp 98.1°F | Resp 18 | Wt 145.4 lb

## 2024-07-02 DIAGNOSIS — C9001 Multiple myeloma in remission: Secondary | ICD-10-CM

## 2024-07-02 DIAGNOSIS — C9002 Multiple myeloma in relapse: Secondary | ICD-10-CM

## 2024-07-02 DIAGNOSIS — Z5112 Encounter for antineoplastic immunotherapy: Secondary | ICD-10-CM

## 2024-07-02 LAB — CBC WITH DIFFERENTIAL (CANCER CENTER ONLY)
Abs Immature Granulocytes: 0.03 K/uL (ref 0.00–0.07)
Basophils Absolute: 0 K/uL (ref 0.0–0.1)
Basophils Relative: 0 %
Eosinophils Absolute: 0 K/uL (ref 0.0–0.5)
Eosinophils Relative: 1 %
HCT: 27.2 % — ABNORMAL LOW (ref 39.0–52.0)
Hemoglobin: 9.3 g/dL — ABNORMAL LOW (ref 13.0–17.0)
Immature Granulocytes: 0 %
Lymphocytes Relative: 19 %
Lymphs Abs: 1.3 K/uL (ref 0.7–4.0)
MCH: 33.6 pg (ref 26.0–34.0)
MCHC: 34.2 g/dL (ref 30.0–36.0)
MCV: 98.2 fL (ref 80.0–100.0)
Monocytes Absolute: 0.3 K/uL (ref 0.1–1.0)
Monocytes Relative: 4 %
Neutro Abs: 5.3 K/uL (ref 1.7–7.7)
Neutrophils Relative %: 76 %
Platelet Count: 202 K/uL (ref 150–400)
RBC: 2.77 MIL/uL — ABNORMAL LOW (ref 4.22–5.81)
RDW: 13.7 % (ref 11.5–15.5)
WBC Count: 7 K/uL (ref 4.0–10.5)
nRBC: 0 % (ref 0.0–0.2)

## 2024-07-02 LAB — COMPREHENSIVE METABOLIC PANEL WITH GFR
ALT: 20 U/L (ref 0–44)
AST: 22 U/L (ref 15–41)
Albumin: 4.2 g/dL (ref 3.5–5.0)
Alkaline Phosphatase: 90 U/L (ref 38–126)
Anion gap: 9 (ref 5–15)
BUN: 13 mg/dL (ref 8–23)
CO2: 24 mmol/L (ref 22–32)
Calcium: 8.6 mg/dL — ABNORMAL LOW (ref 8.9–10.3)
Chloride: 101 mmol/L (ref 98–111)
Creatinine, Ser: 1.04 mg/dL (ref 0.61–1.24)
GFR, Estimated: >60 mL/min
Glucose, Bld: 120 mg/dL — ABNORMAL HIGH (ref 70–99)
Potassium: 4.1 mmol/L (ref 3.5–5.1)
Sodium: 135 mmol/L (ref 135–145)
Total Bilirubin: 0.4 mg/dL (ref 0.0–1.2)
Total Protein: 7.8 g/dL (ref 6.5–8.1)

## 2024-07-02 MED ORDER — DARATUMUMAB-HYALURONIDASE-FIHJ 1800-30000 MG-UT/15ML ~~LOC~~ SOLN
1800.0000 mg | Freq: Once | SUBCUTANEOUS | Status: AC
  Administered 2024-07-02: 1800 mg via SUBCUTANEOUS
  Filled 2024-07-02: qty 15

## 2024-07-02 MED ORDER — ACETAMINOPHEN 500 MG PO TABS
1000.0000 mg | ORAL_TABLET | Freq: Once | ORAL | Status: AC
  Administered 2024-07-02: 1000 mg via ORAL
  Filled 2024-07-02: qty 2

## 2024-07-02 MED ORDER — DIPHENHYDRAMINE HCL 25 MG PO CAPS
50.0000 mg | ORAL_CAPSULE | Freq: Once | ORAL | Status: AC
  Administered 2024-07-02: 50 mg via ORAL
  Filled 2024-07-02: qty 2

## 2024-07-02 MED ORDER — DEXTROSE 5 % IV SOLN
70.0000 mg/m2 | Freq: Once | INTRAVENOUS | Status: AC
  Administered 2024-07-02: 130 mg via INTRAVENOUS
  Filled 2024-07-02: qty 60

## 2024-07-02 MED ORDER — DEXAMETHASONE 4 MG PO TABS
8.0000 mg | ORAL_TABLET | Freq: Once | ORAL | Status: AC
  Administered 2024-07-02: 8 mg via ORAL
  Filled 2024-07-02: qty 2

## 2024-07-02 MED ORDER — SODIUM CHLORIDE 0.9 % IV SOLN: INTRAVENOUS | Status: AC

## 2024-07-02 NOTE — Patient Instructions (Signed)
 [facility] - A DEPT OF [facility]   Discharge Instructions: Thank you for choosing [facility] to provide your oncology and hematology care.   If you have a lab appointment with the Cancer Center, please go directly to the Cancer Center and check in at the registration area.   Wear comfortable clothing and clothing appropriate for easy access to any Portacath or PICC line.   We strive to give you quality time with your provider. You may need to reschedule your appointment if you arrive late (15 or more minutes).  Arriving late affects you and other patients whose appointments are after yours.  Also, if you miss three or more appointments without notifying the office, you may be dismissed from the clinic at the provider's discretion.      For prescription refill requests, have your pharmacy contact our office and allow 72 hours for refills to be completed.    Today you received the following chemotherapy and/or immunotherapy agents: Carfilzomib (Kyprolis)      To help prevent nausea and vomiting after your treatment, we encourage you to take your nausea medication as directed.  BELOW ARE SYMPTOMS THAT SHOULD BE REPORTED IMMEDIATELY: *FEVER GREATER THAN 100.4 F (38 C) OR HIGHER *CHILLS OR SWEATING *NAUSEA AND VOMITING THAT IS NOT CONTROLLED WITH YOUR NAUSEA MEDICATION *UNUSUAL SHORTNESS OF BREATH *UNUSUAL BRUISING OR BLEEDING *URINARY PROBLEMS (pain or burning when urinating, or frequent urination) *BOWEL PROBLEMS (unusual diarrhea, constipation, pain near the anus) TENDERNESS IN MOUTH AND THROAT WITH OR WITHOUT PRESENCE OF ULCERS (sore throat, sores in mouth, or a toothache) UNUSUAL RASH, SWELLING OR PAIN  UNUSUAL VAGINAL DISCHARGE OR ITCHING   Items with * indicate a potential emergency and should be followed up as soon as possible or go to the Emergency Department if any problems should occur.  Please show the CHEMOTHERAPY ALERT CARD or  IMMUNOTHERAPY ALERT CARD at check-in to the Emergency Department and triage nurse.  Should you have questions after your visit or need to cancel or reschedule your appointment, please contact [facility] - A DEPT OF [facility]  Dept: [phone]  and follow the prompts.  Office hours are 8:00 a.m. to 4:30 p.m. Monday - Friday. Please note that voicemails left after 4:00 p.m. may not be returned until the following business day.  We are closed weekends and major holidays. You have access to a nurse at all times for urgent questions. Please call the main number to the clinic Dept: [phone] and follow the prompts.   For any non-urgent questions, you may also contact your provider using MyChart. We now offer e-Visits for anyone 55 and older to request care online for non-urgent symptoms. For details visit [email or web address].   Also download the MyChart app! Go to the app store, search "MyChart", open the app, select [place], and log in with your MyChart username and password.

## 2024-07-07 ENCOUNTER — Other Ambulatory Visit: Payer: Self-pay

## 2024-07-07 ENCOUNTER — Emergency Department (HOSPITAL_COMMUNITY)
Admission: EM | Admit: 2024-07-07 | Discharge: 2024-07-07 | Disposition: A | Discharge status: Home / Self Care | Attending: Emergency Medicine | Admitting: Emergency Medicine

## 2024-07-07 DIAGNOSIS — Z79899 Other long term (current) drug therapy: Secondary | ICD-10-CM | POA: Insufficient documentation

## 2024-07-07 DIAGNOSIS — I10 Essential (primary) hypertension: Secondary | ICD-10-CM | POA: Insufficient documentation

## 2024-07-07 DIAGNOSIS — K5641 Fecal impaction: Secondary | ICD-10-CM | POA: Diagnosis not present

## 2024-07-07 DIAGNOSIS — Z7982 Long term (current) use of aspirin: Secondary | ICD-10-CM | POA: Diagnosis not present

## 2024-07-07 DIAGNOSIS — Z85830 Personal history of malignant neoplasm of bone: Secondary | ICD-10-CM | POA: Diagnosis not present

## 2024-07-07 DIAGNOSIS — R109 Unspecified abdominal pain: Secondary | ICD-10-CM | POA: Diagnosis present

## 2024-07-07 LAB — COMPREHENSIVE METABOLIC PANEL WITH GFR
ALT: 16 U/L (ref 0–44)
AST: 18 U/L (ref 15–41)
Albumin: 4.3 g/dL (ref 3.5–5.0)
Alkaline Phosphatase: 89 U/L (ref 38–126)
Anion gap: 9 (ref 5–15)
BUN: 12 mg/dL (ref 8–23)
CO2: 24 mmol/L (ref 22–32)
Calcium: 9.3 mg/dL (ref 8.9–10.3)
Chloride: 102 mmol/L (ref 98–111)
Creatinine, Ser: 0.87 mg/dL (ref 0.61–1.24)
GFR, Estimated: >60 mL/min
Glucose, Bld: 81 mg/dL (ref 70–99)
Potassium: 4 mmol/L (ref 3.5–5.1)
Sodium: 134 mmol/L — ABNORMAL LOW (ref 135–145)
Total Bilirubin: 0.5 mg/dL (ref 0.0–1.2)
Total Protein: 7.7 g/dL (ref 6.5–8.1)

## 2024-07-07 LAB — LIPASE, BLOOD: Lipase: 18 U/L (ref 11–51)

## 2024-07-07 LAB — CBC
HCT: 29.4 % — ABNORMAL LOW (ref 39.0–52.0)
Hemoglobin: 9.8 g/dL — ABNORMAL LOW (ref 13.0–17.0)
MCH: 33.7 pg (ref 26.0–34.0)
MCHC: 33.3 g/dL (ref 30.0–36.0)
MCV: 101 fL — ABNORMAL HIGH (ref 80.0–100.0)
Platelets: 198 K/uL (ref 150–400)
RBC: 2.91 MIL/uL — ABNORMAL LOW (ref 4.22–5.81)
RDW: 13.9 % (ref 11.5–15.5)
WBC: 6.2 K/uL (ref 4.0–10.5)
nRBC: 0.3 % — ABNORMAL HIGH (ref 0.0–0.2)

## 2024-07-07 LAB — URINALYSIS, ROUTINE W REFLEX MICROSCOPIC
Bacteria, UA: NONE SEEN
Bilirubin Urine: NEGATIVE
Glucose, UA: NEGATIVE mg/dL
Hgb urine dipstick: NEGATIVE
Ketones, ur: NEGATIVE mg/dL
Leukocytes,Ua: NEGATIVE
Nitrite: NEGATIVE
Protein, ur: 30 mg/dL — AB
Specific Gravity, Urine: 1.011 (ref 1.005–1.030)
pH: 5 (ref 5.0–8.0)

## 2024-07-07 MED ORDER — KETOROLAC TROMETHAMINE 15 MG/ML IJ SOLN
15.0000 mg | Freq: Once | INTRAMUSCULAR | Status: AC
  Administered 2024-07-07: 15 mg via INTRAMUSCULAR
  Filled 2024-07-07: qty 1

## 2024-07-07 MED ORDER — FLEET ENEMA RE ENEM
1.0000 | ENEMA | Freq: Once | RECTAL | Status: AC
  Administered 2024-07-07: 1 via RECTAL
  Filled 2024-07-07: qty 1

## 2024-07-07 MED ORDER — LACTULOSE 10 GM/15ML PO SOLN
10.0000 g | Freq: Every day | ORAL | 0 refills | Status: AC | PRN
  Filled 2024-07-07: qty 236, 15d supply, fill #0

## 2024-07-07 NOTE — ED Provider Notes (Signed)
 " [place] [facility]ERGENC[facility] DEPARTMENT AT [facility] Provider Note   CSN: [id number] Arrival date & time: 07/07/24  2007     Patient presents with: Abdominal Pain   Travis Nguyen is a 73 y.o. male.   Patient is a 73 year old male with a history of hypertension, multiple myeloma, chronic pain on opiates who is presenting today with complaints of rectal pain and inability to have a bowel movement for days now.  He reports that it is even painful when he tries to urinate.  He tried Epsom salts and stool softeners at home without any improvement.  He has had no nausea or vomiting and denies any significant abdominal pain.  The history is provided by the patient and medical records.  Abdominal Pain      Prior to Admission medications  Medication Sig Start Date End Date Taking? Authorizing Provider  acyclovir  (ZOVIRAX ) 400 MG tablet Take 1 tablet (400 mg total) by mouth 2 (two) times daily. 11/28/23   Onesimo Emaline Brink, MD  amLODipine  (NORVASC ) 5 MG tablet Take 1 tablet (5 mg total) by mouth daily. 02/27/24     aspirin EC 81 MG tablet Take 81 mg by mouth daily. Swallow whole.    [provider]  lactulose  (CHRONULAC ) 10 GM/15ML solution Take 15 mLs (10 g total) by mouth daily as needed for severe constipation. 07/07/24  Yes Doretha Folks, MD  lidocaine -prilocaine  (EMLA ) cream APPLY TOPICALLY TO PORT-A-CATH DAILY AS NEEDED 04/08/24 04/08/25  Onesimo Emaline Brink, MD  losartan  (COZAAR ) 100 MG tablet Take 1 tablet (100 mg total) by mouth daily. 02/27/24     methocarbamol  (ROBAXIN ) 500 MG tablet Take 1 tablet (500 mg total) by mouth every 6 (six) hours as needed for muscle spasms. 03/16/24   Raford Lenis, MD  oxyCODONE -acetaminophen  (PERCOCET ) 10-325 MG tablet Take 1 tablet by mouth 2 (two) times daily as needed. 06/22/24     pomalidomide  (POMALYST ) 2 MG capsule Take 1 capsule (2 mg total) by mouth daily. Take 1 capsule (2 mg total)  by mouth  daily for 21 days. Take 7 days  off . Repeat cycle 06/21/24   Kale, [name], MD    Allergies: Aspirin    Review of Systems  Gastrointestinal:  Positive for abdominal pain.    Updated Vital Signs BP (!) 165/84 (BP Location: Right Arm)   Pulse (!) 59   Temp 98.7 F (37.1 C)   Resp 16   Ht 6' 1 (1.854 m)   Wt 69.9 kg   SpO2 100%   BMI 20.32 kg/m   Physical Exam Vitals and nursing note reviewed.  Constitutional:      General: He is not in acute distress.    Appearance: He is well-developed.  HENT:     Head: Normocephalic and atraumatic.  Eyes:     Conjunctiva/sclera: Conjunctivae normal.     Pupils: Pupils are equal, round, and reactive to light.  Cardiovascular:     Rate and Rhythm: Normal rate and regular rhythm.     Heart sounds: No murmur heard. Pulmonary:     Effort: Pulmonary effort is normal. No respiratory distress.     Breath sounds: Normal breath sounds. No wheezing or rales.  Abdominal:     General: Bowel sounds are normal. There is no distension.     Palpations: Abdomen is soft.     Tenderness: There is no abdominal tenderness. There is no guarding or rebound.  Genitourinary:    Comments: Fecal impaction with hard  stool Musculoskeletal:        General: No tenderness. Normal range of motion.     Cervical back: Normal range of motion and neck supple.  Skin:    General: Skin is warm and dry.     Findings: No erythema or rash.  Neurological:     Mental Status: He is alert and oriented to person, place, and time.  Psychiatric:        Behavior: Behavior normal.     (all labs ordered are listed, but only abnormal results are displayed) Labs Reviewed  COMPREHENSIVE METABOLIC PANEL WITH GFR - Abnormal; Notable for the following components:      Result Value   Sodium 134 (*)    All other components within normal limits  CBC - Abnormal; Notable for the following components:   RBC 2.91 (*)    Hemoglobin 9.8 (*)    HCT 29.4 (*)    MCV 101.0 (*)    nRBC 0.3 (*)    All other  components within normal limits  URINALYSIS, ROUTINE W REFLEX MICROSCOPIC - Abnormal; Notable for the following components:   Protein, ur 30 (*)    All other components within normal limits  LIPASE, BLOOD    EKG: None  Radiology: No results found.   Procedures   Medications Ordered in the ED  sodium phosphate  (FLEET) enema 1 enema (has no administration in time range)  ketorolac  (TORADOL ) 15 MG/ML injection 15 mg (has no administration in time range)                                    Medical Decision Making Amount and/or Complexity of Data Reviewed Labs: ordered. Decision-making details documented in ED Course.  Risk OTC drugs. Prescription drug management.   Pt with multiple medical problems and comorbidities and presenting today with a complaint that caries a high risk for morbidity and mortality.  Patient presenting today with symptoms most consistent with a fecal impaction.  He has no abdominal pain or symptoms concerning for bowel obstruction.  He is not having any vomiting.  Patient has hard stool present in the rectum at this time.  Attempted to manually disimpact with only minimal stool removal.  Will attempt enema. I independently interpreted patient's labs and CBC, BMP and UA all without acute findings.  CBC shows chronic anemia with a hemoglobin of 9.8 which is stable.  Lipase was negative. Fleets enema was given. After enema patient was able to have a large bowel movement and now reports feeling much better.  At this time he is stable for discharge.     Final diagnoses:  Fecal impaction in rectum [facility])    ED Discharge Orders          Ordered    lactulose  (CHRONULAC ) 10 GM/15ML solution  Daily PRN        07/07/24 2306               Doretha Folks, MD 07/07/24 2306  "

## 2024-07-07 NOTE — ED Triage Notes (Signed)
 Pt POV d/t having back pain and ABD pain with constipation.  He states I may have a blockage but also just started taking pain meds for his bone cancer.

## 2024-07-07 NOTE — Discharge Instructions (Addendum)
 Take your normal medication that you take for your bowel movements but if it gets really bad you can try the lactulose .  Return if you start having vomiting, abdominal pain or other issues.

## 2024-07-08 ENCOUNTER — Other Ambulatory Visit (HOSPITAL_COMMUNITY): Payer: Self-pay

## 2024-07-09 ENCOUNTER — Other Ambulatory Visit (HOSPITAL_COMMUNITY): Payer: Self-pay

## 2024-07-12 ENCOUNTER — Other Ambulatory Visit (HOSPITAL_COMMUNITY): Payer: Self-pay

## 2024-07-15 ENCOUNTER — Other Ambulatory Visit: Payer: Self-pay | Admitting: Hematology

## 2024-07-15 DIAGNOSIS — C9002 Multiple myeloma in relapse: Secondary | ICD-10-CM

## 2024-07-16 ENCOUNTER — Inpatient Hospital Stay: Attending: Oncology

## 2024-07-16 ENCOUNTER — Other Ambulatory Visit (HOSPITAL_COMMUNITY): Payer: Self-pay

## 2024-07-16 ENCOUNTER — Other Ambulatory Visit: Payer: Self-pay

## 2024-07-16 ENCOUNTER — Inpatient Hospital Stay

## 2024-07-16 ENCOUNTER — Encounter: Payer: Self-pay | Admitting: Hematology

## 2024-07-16 VITALS — BP 166/77 | HR 50 | Temp 98.3°F | Resp 18 | Wt 144.2 lb

## 2024-07-16 DIAGNOSIS — Z79620 Long term (current) use of immunosuppressive biologic: Secondary | ICD-10-CM | POA: Diagnosis not present

## 2024-07-16 DIAGNOSIS — Z5112 Encounter for antineoplastic immunotherapy: Secondary | ICD-10-CM | POA: Insufficient documentation

## 2024-07-16 DIAGNOSIS — C9002 Multiple myeloma in relapse: Secondary | ICD-10-CM | POA: Diagnosis present

## 2024-07-16 DIAGNOSIS — C9001 Multiple myeloma in remission: Secondary | ICD-10-CM

## 2024-07-16 DIAGNOSIS — Z79899 Other long term (current) drug therapy: Secondary | ICD-10-CM | POA: Insufficient documentation

## 2024-07-16 LAB — COMPREHENSIVE METABOLIC PANEL WITH GFR
ALT: 13 U/L (ref 0–44)
AST: 21 U/L (ref 15–41)
Albumin: 4.2 g/dL (ref 3.5–5.0)
Alkaline Phosphatase: 91 U/L (ref 38–126)
Anion gap: 9 (ref 5–15)
BUN: 12 mg/dL (ref 8–23)
CO2: 23 mmol/L (ref 22–32)
Calcium: 8.7 mg/dL — ABNORMAL LOW (ref 8.9–10.3)
Chloride: 104 mmol/L (ref 98–111)
Creatinine, Ser: 0.84 mg/dL (ref 0.61–1.24)
GFR, Estimated: >60 mL/min
Glucose, Bld: 96 mg/dL (ref 70–99)
Potassium: 4 mmol/L (ref 3.5–5.1)
Sodium: 136 mmol/L (ref 135–145)
Total Bilirubin: 0.4 mg/dL (ref 0.0–1.2)
Total Protein: 7.2 g/dL (ref 6.5–8.1)

## 2024-07-16 LAB — CBC WITH DIFFERENTIAL (CANCER CENTER ONLY)
Abs Immature Granulocytes: 0.01 K/uL (ref 0.00–0.07)
Basophils Absolute: 0 K/uL (ref 0.0–0.1)
Basophils Relative: 1 %
Eosinophils Absolute: 0.1 K/uL (ref 0.0–0.5)
Eosinophils Relative: 3 %
HCT: 26.8 % — ABNORMAL LOW (ref 39.0–52.0)
Hemoglobin: 9.2 g/dL — ABNORMAL LOW (ref 13.0–17.0)
Immature Granulocytes: 0 %
Lymphocytes Relative: 36 %
Lymphs Abs: 1.5 K/uL (ref 0.7–4.0)
MCH: 33.9 pg (ref 26.0–34.0)
MCHC: 34.3 g/dL (ref 30.0–36.0)
MCV: 98.9 fL (ref 80.0–100.0)
Monocytes Absolute: 0.4 K/uL (ref 0.1–1.0)
Monocytes Relative: 10 %
Neutro Abs: 2.1 K/uL (ref 1.7–7.7)
Neutrophils Relative %: 50 %
Platelet Count: 260 K/uL (ref 150–400)
RBC: 2.71 MIL/uL — ABNORMAL LOW (ref 4.22–5.81)
RDW: 14.1 % (ref 11.5–15.5)
WBC Count: 4.1 K/uL (ref 4.0–10.5)
nRBC: 0 % (ref 0.0–0.2)

## 2024-07-16 MED ORDER — DEXAMETHASONE 4 MG PO TABS
8.0000 mg | ORAL_TABLET | Freq: Once | ORAL | Status: AC
  Administered 2024-07-16: 8 mg via ORAL
  Filled 2024-07-16: qty 2

## 2024-07-16 MED ORDER — SODIUM CHLORIDE 0.9 % IV SOLN: INTRAVENOUS | Status: AC

## 2024-07-16 MED ORDER — DIPHENHYDRAMINE HCL 25 MG PO CAPS
50.0000 mg | ORAL_CAPSULE | Freq: Once | ORAL | Status: AC
  Administered 2024-07-16: 50 mg via ORAL
  Filled 2024-07-16: qty 2

## 2024-07-16 MED ORDER — DEXTROSE 5 % IV SOLN
70.0000 mg/m2 | Freq: Once | INTRAVENOUS | Status: AC
  Administered 2024-07-16: 130 mg via INTRAVENOUS
  Filled 2024-07-16: qty 60

## 2024-07-16 MED ORDER — DARATUMUMAB-HYALURONIDASE-FIHJ 1800-30000 MG-UT/15ML ~~LOC~~ SOLN
1800.0000 mg | Freq: Once | SUBCUTANEOUS | Status: AC
  Administered 2024-07-16: 1800 mg via SUBCUTANEOUS
  Filled 2024-07-16: qty 15

## 2024-07-16 MED ORDER — ACETAMINOPHEN 500 MG PO TABS
1000.0000 mg | ORAL_TABLET | Freq: Once | ORAL | Status: AC
  Administered 2024-07-16: 1000 mg via ORAL
  Filled 2024-07-16: qty 2

## 2024-07-16 MED ORDER — SENNOSIDES-DOCUSATE SODIUM 8.6-50 MG PO TABS
1.0000 | ORAL_TABLET | Freq: Every day | ORAL | 3 refills | Status: AC
  Filled 2024-07-16: qty 30, 30d supply, fill #0

## 2024-07-16 NOTE — Patient Instructions (Signed)
 [facility] - A DEPT OF [name]  Discharge Instructions: Thank you for choosing [facility] to provide your oncology and hematology care.   If you have a lab appointment with the Cancer Center, please go directly to the Cancer Center and check in at the registration area.   Wear comfortable clothing and clothing appropriate for easy access to any Portacath or PICC line.   We strive to give you quality time with your provider. You may need to reschedule your appointment if you arrive late (15 or more minutes).  Arriving late affects you and other patients whose appointments are after yours.  Also, if you miss three or more appointments without notifying the office, you may be dismissed from the clinic at the providers discretion.      For prescription refill requests, have your pharmacy contact our office and allow 72 hours for refills to be completed.    Today you received the following chemotherapy and/or immunotherapy agents: Kyprolis  and Darzalex  faspro      To help prevent nausea and vomiting after your treatment, we encourage you to take your nausea medication as directed.  BELOW ARE SYMPTOMS THAT SHOULD BE REPORTED IMMEDIATELY: *FEVER GREATER THAN 100.4 F (38 C) OR HIGHER *CHILLS OR SWEATING *NAUSEA AND VOMITING THAT IS NOT CONTROLLED WITH YOUR NAUSEA MEDICATION *UNUSUAL SHORTNESS OF BREATH *UNUSUAL BRUISING OR BLEEDING *URINARY PROBLEMS (pain or burning when urinating, or frequent urination) *BOWEL PROBLEMS (unusual diarrhea, constipation, pain near the anus) TENDERNESS IN MOUTH AND THROAT WITH OR WITHOUT PRESENCE OF ULCERS (sore throat, sores in mouth, or a toothache) UNUSUAL RASH, SWELLING OR PAIN  UNUSUAL VAGINAL DISCHARGE OR ITCHING   Items with * indicate a potential emergency and should be followed up as soon as possible or go to the Emergency Department if any problems should occur.  Please show the CHEMOTHERAPY ALERT  CARD or IMMUNOTHERAPY ALERT CARD at check-in to the Emergency Department and triage nurse.  Should you have questions after your visit or need to cancel or reschedule your appointment, please contact [facility] - A DEPT OF [facility]  Dept: [phone]  and follow the prompts.  Office hours are 8:00 a.m. to 4:30 p.m. Monday - Friday. Please note that voicemails left after 4:00 p.m. may not be returned until the following business day.  We are closed weekends and major holidays. You have access to a nurse at all times for urgent questions. Please call the main number to the clinic Dept: [phone] and follow the prompts.   For any non-urgent questions, you may also contact your provider using MyChart. We now offer e-Visits for anyone 62 and older to request care online for non-urgent symptoms. For details visit [email or web address].   Also download the MyChart app! Go to the app store, search MyChart, open the app, select [place], and log in with your MyChart username and password.

## 2024-07-16 NOTE — Progress Notes (Signed)
 Patient here for Darzalex  Faspro and Kyprolis . He was just seen in the ED on 07/07/24 for fecal impaction and is on lactulose  to treat. Although it's improving, he still says it isn't normal. He last had a partial BM yesterday. (They did give him an enema in the ED which resulted in a large BM). He is passing gas and his abdomen is not tender. Also, his BP today is 177/88.  Ok to treat today based on above considerations- Dr. Tina contacted/aware.

## 2024-07-20 LAB — MULTIPLE MYELOMA PANEL, SERUM
Albumin SerPl Elph-Mcnc: 3.4 g/dL (ref 2.9–4.4)
Albumin/Glob SerPl: 1.1 (ref 0.7–1.7)
Alpha 1: 0.3 g/dL (ref 0.0–0.4)
Alpha2 Glob SerPl Elph-Mcnc: 0.6 g/dL (ref 0.4–1.0)
B-Globulin SerPl Elph-Mcnc: 0.9 g/dL (ref 0.7–1.3)
Gamma Glob SerPl Elph-Mcnc: 1.3 g/dL (ref 0.4–1.8)
Globulin, Total: 3.2 g/dL (ref 2.2–3.9)
IgA: 26 mg/dL — ABNORMAL LOW (ref 61–437)
IgG (Immunoglobin G), Serum: 1493 mg/dL (ref 603–1613)
IgM (Immunoglobulin M), Srm: 19 mg/dL (ref 15–143)
M Protein SerPl Elph-Mcnc: 1.1 g/dL — ABNORMAL HIGH
Total Protein ELP: 6.6 g/dL (ref 6.0–8.5)

## 2024-07-21 ENCOUNTER — Inpatient Hospital Stay (HOSPITAL_COMMUNITY)

## 2024-07-21 ENCOUNTER — Other Ambulatory Visit: Payer: Self-pay

## 2024-07-21 ENCOUNTER — Emergency Department (HOSPITAL_COMMUNITY)

## 2024-07-21 ENCOUNTER — Encounter (HOSPITAL_COMMUNITY): Payer: Self-pay

## 2024-07-21 ENCOUNTER — Inpatient Hospital Stay (HOSPITAL_COMMUNITY)
Admission: EM | Admit: 2024-07-21 | Discharge: 2024-08-06 | DRG: 329 | Disposition: A | Discharge status: Home / Self Care | Attending: Internal Medicine | Admitting: Internal Medicine

## 2024-07-21 DIAGNOSIS — Z681 Body mass index (BMI) 19 or less, adult: Secondary | ICD-10-CM

## 2024-07-21 DIAGNOSIS — F331 Major depressive disorder, recurrent, moderate: Secondary | ICD-10-CM | POA: Insufficient documentation

## 2024-07-21 DIAGNOSIS — Z79899 Other long term (current) drug therapy: Secondary | ICD-10-CM

## 2024-07-21 DIAGNOSIS — Z8546 Personal history of malignant neoplasm of prostate: Secondary | ICD-10-CM | POA: Diagnosis not present

## 2024-07-21 DIAGNOSIS — K567 Ileus, unspecified: Secondary | ICD-10-CM | POA: Diagnosis not present

## 2024-07-21 DIAGNOSIS — Z8719 Personal history of other diseases of the digestive system: Secondary | ICD-10-CM | POA: Insufficient documentation

## 2024-07-21 DIAGNOSIS — K56609 Unspecified intestinal obstruction, unspecified as to partial versus complete obstruction: Secondary | ICD-10-CM | POA: Diagnosis present

## 2024-07-21 DIAGNOSIS — D75839 Thrombocytosis, unspecified: Secondary | ICD-10-CM | POA: Diagnosis present

## 2024-07-21 DIAGNOSIS — E8809 Other disorders of plasma-protein metabolism, not elsewhere classified: Secondary | ICD-10-CM | POA: Diagnosis not present

## 2024-07-21 DIAGNOSIS — K9189 Other postprocedural complications and disorders of digestive system: Secondary | ICD-10-CM | POA: Diagnosis not present

## 2024-07-21 DIAGNOSIS — E7800 Pure hypercholesterolemia, unspecified: Secondary | ICD-10-CM | POA: Diagnosis present

## 2024-07-21 DIAGNOSIS — Z85820 Personal history of malignant melanoma of skin: Secondary | ICD-10-CM

## 2024-07-21 DIAGNOSIS — I160 Hypertensive urgency: Secondary | ICD-10-CM | POA: Diagnosis present

## 2024-07-21 DIAGNOSIS — D630 Anemia in neoplastic disease: Secondary | ICD-10-CM | POA: Diagnosis present

## 2024-07-21 DIAGNOSIS — Z8249 Family history of ischemic heart disease and other diseases of the circulatory system: Secondary | ICD-10-CM

## 2024-07-21 DIAGNOSIS — Z9079 Acquired absence of other genital organ(s): Secondary | ICD-10-CM

## 2024-07-21 DIAGNOSIS — C9000 Multiple myeloma not having achieved remission: Secondary | ICD-10-CM | POA: Diagnosis present

## 2024-07-21 DIAGNOSIS — Z7982 Long term (current) use of aspirin: Secondary | ICD-10-CM

## 2024-07-21 DIAGNOSIS — R001 Bradycardia, unspecified: Secondary | ICD-10-CM | POA: Diagnosis present

## 2024-07-21 DIAGNOSIS — Z8701 Personal history of pneumonia (recurrent): Secondary | ICD-10-CM

## 2024-07-21 DIAGNOSIS — E43 Unspecified severe protein-calorie malnutrition: Secondary | ICD-10-CM | POA: Diagnosis present

## 2024-07-21 DIAGNOSIS — Z1152 Encounter for screening for COVID-19: Secondary | ICD-10-CM

## 2024-07-21 DIAGNOSIS — E871 Hypo-osmolality and hyponatremia: Secondary | ICD-10-CM | POA: Diagnosis not present

## 2024-07-21 DIAGNOSIS — I10 Essential (primary) hypertension: Secondary | ICD-10-CM | POA: Diagnosis present

## 2024-07-21 DIAGNOSIS — K565 Intestinal adhesions [bands], unspecified as to partial versus complete obstruction: Principal | ICD-10-CM | POA: Diagnosis present

## 2024-07-21 DIAGNOSIS — Z86718 Personal history of other venous thrombosis and embolism: Secondary | ICD-10-CM | POA: Diagnosis not present

## 2024-07-21 DIAGNOSIS — C9001 Multiple myeloma in remission: Secondary | ICD-10-CM | POA: Diagnosis not present

## 2024-07-21 DIAGNOSIS — Z87891 Personal history of nicotine dependence: Secondary | ICD-10-CM | POA: Diagnosis not present

## 2024-07-21 DIAGNOSIS — R109 Unspecified abdominal pain: Principal | ICD-10-CM

## 2024-07-21 DIAGNOSIS — K5900 Constipation, unspecified: Secondary | ICD-10-CM | POA: Insufficient documentation

## 2024-07-21 LAB — URINALYSIS, ROUTINE W REFLEX MICROSCOPIC
Bacteria, UA: NONE SEEN
Bilirubin Urine: NEGATIVE
Glucose, UA: NEGATIVE mg/dL
Hgb urine dipstick: NEGATIVE
Ketones, ur: NEGATIVE mg/dL
Leukocytes,Ua: NEGATIVE
Nitrite: NEGATIVE
Protein, ur: 30 mg/dL — AB
Specific Gravity, Urine: >1.046 — ABNORMAL HIGH (ref 1.005–1.030)
pH: 6 (ref 5.0–8.0)

## 2024-07-21 LAB — CBC WITH DIFFERENTIAL/PLATELET
Abs Immature Granulocytes: 0.03 K/uL (ref 0.00–0.07)
Basophils Absolute: 0 K/uL (ref 0.0–0.1)
Basophils Relative: 0 %
Eosinophils Absolute: 0 K/uL (ref 0.0–0.5)
Eosinophils Relative: 0 %
HCT: 33.6 % — ABNORMAL LOW (ref 39.0–52.0)
Hemoglobin: 10.7 g/dL — ABNORMAL LOW (ref 13.0–17.0)
Immature Granulocytes: 0 %
Lymphocytes Relative: 16 %
Lymphs Abs: 1.2 K/uL (ref 0.7–4.0)
MCH: 32.8 pg (ref 26.0–34.0)
MCHC: 31.8 g/dL (ref 30.0–36.0)
MCV: 103.1 fL — ABNORMAL HIGH (ref 80.0–100.0)
Monocytes Absolute: 0.5 K/uL (ref 0.1–1.0)
Monocytes Relative: 7 %
Neutro Abs: 5.4 K/uL (ref 1.7–7.7)
Neutrophils Relative %: 77 %
Platelets: 182 K/uL (ref 150–400)
RBC: 3.26 MIL/uL — ABNORMAL LOW (ref 4.22–5.81)
RDW: 14.2 % (ref 11.5–15.5)
WBC: 7.1 K/uL (ref 4.0–10.5)
nRBC: 0.3 % — ABNORMAL HIGH (ref 0.0–0.2)

## 2024-07-21 LAB — COMPREHENSIVE METABOLIC PANEL WITH GFR
ALT: 13 U/L (ref 0–44)
AST: 18 U/L (ref 15–41)
Albumin: 4.2 g/dL (ref 3.5–5.0)
Alkaline Phosphatase: 82 U/L (ref 38–126)
Anion gap: 11 (ref 5–15)
BUN: 16 mg/dL (ref 8–23)
CO2: 24 mmol/L (ref 22–32)
Calcium: 9.8 mg/dL (ref 8.9–10.3)
Chloride: 101 mmol/L (ref 98–111)
Creatinine, Ser: 0.81 mg/dL (ref 0.61–1.24)
GFR, Estimated: >60 mL/min
Glucose, Bld: 118 mg/dL — ABNORMAL HIGH (ref 70–99)
Potassium: 3.8 mmol/L (ref 3.5–5.1)
Sodium: 136 mmol/L (ref 135–145)
Total Bilirubin: 0.6 mg/dL (ref 0.0–1.2)
Total Protein: 7.6 g/dL (ref 6.5–8.1)

## 2024-07-21 LAB — LIPASE, BLOOD: Lipase: 19 U/L (ref 11–51)

## 2024-07-21 LAB — MAGNESIUM: Magnesium: 2.2 mg/dL (ref 1.7–2.4)

## 2024-07-21 MED ORDER — PROCHLORPERAZINE EDISYLATE 10 MG/2ML IJ SOLN
10.0000 mg | Freq: Once | INTRAMUSCULAR | Status: AC
  Administered 2024-07-21: 10 mg via INTRAVENOUS
  Filled 2024-07-21: qty 2

## 2024-07-21 MED ORDER — DIATRIZOATE MEGLUMINE & SODIUM 66-10 % PO SOLN
90.0000 mL | Freq: Once | ORAL | Status: AC
  Administered 2024-07-22: 90 mL via NASOGASTRIC
  Filled 2024-07-21: qty 90

## 2024-07-21 MED ORDER — ONDANSETRON HCL 4 MG/2ML IJ SOLN
4.0000 mg | Freq: Four times a day (QID) | INTRAMUSCULAR | Status: DC | PRN
  Administered 2024-07-29 – 2024-07-30 (×2): 4 mg via INTRAVENOUS
  Filled 2024-07-21 (×2): qty 2

## 2024-07-21 MED ORDER — MORPHINE SULFATE (PF) 4 MG/ML IV SOLN
4.0000 mg | Freq: Once | INTRAVENOUS | Status: AC
  Administered 2024-07-21: 4 mg via INTRAVENOUS
  Filled 2024-07-21: qty 1

## 2024-07-21 MED ORDER — HYDRALAZINE HCL 20 MG/ML IJ SOLN
10.0000 mg | INTRAMUSCULAR | Status: DC | PRN
  Administered 2024-07-22 – 2024-08-01 (×10): 10 mg via INTRAVENOUS
  Filled 2024-07-21 (×10): qty 1

## 2024-07-21 MED ORDER — LACTATED RINGERS IV BOLUS
1000.0000 mL | Freq: Once | INTRAVENOUS | Status: AC
  Administered 2024-07-21: 1000 mL via INTRAVENOUS

## 2024-07-21 MED ORDER — POTASSIUM CHLORIDE 10 MEQ/100ML IV SOLN
10.0000 meq | INTRAVENOUS | Status: AC
  Filled 2024-07-21: qty 100

## 2024-07-21 MED ORDER — SODIUM CHLORIDE 0.9 % IV BOLUS
500.0000 mL | Freq: Once | INTRAVENOUS | Status: AC
  Administered 2024-07-21: 500 mL via INTRAVENOUS

## 2024-07-21 MED ORDER — HYDRALAZINE HCL 20 MG/ML IJ SOLN
10.0000 mg | Freq: Once | INTRAMUSCULAR | Status: AC
  Administered 2024-07-21: 10 mg via INTRAVENOUS
  Filled 2024-07-21: qty 1

## 2024-07-21 MED ORDER — ACETAMINOPHEN 650 MG RE SUPP: 650.0000 mg | Freq: Four times a day (QID) | RECTAL | Status: DC | PRN

## 2024-07-21 MED ORDER — MORPHINE SULFATE (PF) 4 MG/ML IV SOLN
4.0000 mg | INTRAVENOUS | Status: DC | PRN
  Administered 2024-07-21 – 2024-07-23 (×7): 4 mg via INTRAVENOUS
  Filled 2024-07-21 (×7): qty 1

## 2024-07-21 MED ORDER — ACETAMINOPHEN 325 MG PO TABS
650.0000 mg | ORAL_TABLET | Freq: Four times a day (QID) | ORAL | Status: DC | PRN
  Administered 2024-07-26 – 2024-07-27 (×3): 650 mg via ORAL
  Filled 2024-07-21 (×3): qty 2

## 2024-07-21 MED ORDER — IOHEXOL 300 MG/ML  SOLN
100.0000 mL | Freq: Once | INTRAMUSCULAR | Status: AC | PRN
  Administered 2024-07-21: 100 mL via INTRAVENOUS

## 2024-07-21 MED ORDER — POTASSIUM CHLORIDE IN NACL 20-0.9 MEQ/L-% IV SOLN
INTRAVENOUS | Status: AC
  Filled 2024-07-21 (×2): qty 1000

## 2024-07-21 MED ORDER — POTASSIUM CHLORIDE 10 MEQ/100ML IV SOLN
10.0000 meq | INTRAVENOUS | Status: AC
  Administered 2024-07-21 (×2): 10 meq via INTRAVENOUS
  Filled 2024-07-21: qty 100

## 2024-07-21 MED ORDER — ONDANSETRON HCL 4 MG PO TABS
4.0000 mg | ORAL_TABLET | Freq: Four times a day (QID) | ORAL | Status: DC | PRN
  Administered 2024-07-26 – 2024-07-27 (×2): 4 mg via ORAL
  Filled 2024-07-21 (×2): qty 1

## 2024-07-21 MED ORDER — ONDANSETRON HCL 4 MG/2ML IJ SOLN
4.0000 mg | Freq: Once | INTRAMUSCULAR | Status: AC
  Administered 2024-07-21: 4 mg via INTRAVENOUS
  Filled 2024-07-21: qty 2

## 2024-07-21 NOTE — ED Notes (Signed)
 Repositioned NG Tube

## 2024-07-21 NOTE — ED Notes (Signed)
 Waiting for pumps and channels

## 2024-07-21 NOTE — ED Triage Notes (Signed)
 Pt BIB EMS from home. Pt reports with abdominal pain and constipation x 4 days.
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
## 2024-07-21 NOTE — ED Provider Notes (Signed)
" °  [place] EMERGENCY DEPARTMENT AT [facility] Provider Assume Care Note I assumed care of [name] on 07/21/2024 at 730 AM from Dr. Theadore.   Briefly, Travis Nguyen is a 74 y.o. male who: PMHx: Prior prostatectomy, multiple myeloma, prior small bowel obstruction, HTN P/w 2 days of severe abdominal pain, p.o. intolerance, found to have SBO on CT  Plan at the time of handoff: Patient requires admission, consults to hospitalists and surgery are pending   Please refer to the original providers note for additional information regarding the care of Travis Nguyen.  Reassessment: Patient resting quietly in bed  Vital Signs:  ED Triage Vitals  Encounter Vitals Group     BP 07/21/24 0303 116/71     Girls Systolic BP Percentile --      Girls Diastolic BP Percentile --      Boys Systolic BP Percentile --      Boys Diastolic BP Percentile --      Pulse Rate 07/21/24 0303 85     Resp 07/21/24 0303 19     Temp 07/21/24 0303 97.7 F (36.5 C)     Temp Source 07/21/24 0303 Oral     SpO2 07/21/24 0303 100 %     Weight --      Height --      Head Circumference --      Peak Flow --      Pain Score 07/21/24 0254 10     Pain Loc --      Pain Education --      Exclude from Growth Chart --      Hemodynamics:  The patient is hemodynamically stable. Mental Status:  The patient is alert  Additional MDM: Patient resting quietly in bed.  Spoke with Dr. Celinda with hospitalist who accepted the patient to his service.  Additionally spoke with Burnard Banter, PA-C with surgery who also preliminary evaluation of imaging recommends NG and medical management, however states that she will evaluate the patient and provide further recommendations as indicated.  This was communicated to Dr. Celinda.  No additional acute events while patient was under my care  Disposition: ADMIT: I believe the patient requires admission for further care and management. The patient was admitted to  hospitalist with surgery consult. Please see inpatient provider note for additional treatment plan details.    Travis Nguyen Later, MD Emergency Medicine    Later Travis RAMAN, MD 07/21/24 1025  "
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
## 2024-07-22 ENCOUNTER — Inpatient Hospital Stay (HOSPITAL_COMMUNITY)

## 2024-07-22 DIAGNOSIS — K56609 Unspecified intestinal obstruction, unspecified as to partial versus complete obstruction: Secondary | ICD-10-CM | POA: Diagnosis not present

## 2024-07-22 LAB — CBC
HCT: 35.3 % — ABNORMAL LOW (ref 39.0–52.0)
Hemoglobin: 11.6 g/dL — ABNORMAL LOW (ref 13.0–17.0)
MCH: 33.9 pg (ref 26.0–34.0)
MCHC: 32.9 g/dL (ref 30.0–36.0)
MCV: 103.2 fL — ABNORMAL HIGH (ref 80.0–100.0)
Platelets: 175 K/uL (ref 150–400)
RBC: 3.42 MIL/uL — ABNORMAL LOW (ref 4.22–5.81)
RDW: 14.6 % (ref 11.5–15.5)
WBC: 4.4 K/uL (ref 4.0–10.5)
nRBC: 0 % (ref 0.0–0.2)

## 2024-07-22 LAB — COMPREHENSIVE METABOLIC PANEL WITH GFR
ALT: 12 U/L (ref 0–44)
AST: 17 U/L (ref 15–41)
Albumin: 3.9 g/dL (ref 3.5–5.0)
Alkaline Phosphatase: 72 U/L (ref 38–126)
Anion gap: 11 (ref 5–15)
BUN: 9 mg/dL (ref 8–23)
CO2: 23 mmol/L (ref 22–32)
Calcium: 9.1 mg/dL (ref 8.9–10.3)
Chloride: 101 mmol/L (ref 98–111)
Creatinine, Ser: 0.69 mg/dL (ref 0.61–1.24)
GFR, Estimated: >60 mL/min
Glucose, Bld: 80 mg/dL (ref 70–99)
Potassium: 4.8 mmol/L (ref 3.5–5.1)
Sodium: 135 mmol/L (ref 135–145)
Total Bilirubin: 0.7 mg/dL (ref 0.0–1.2)
Total Protein: 7.1 g/dL (ref 6.5–8.1)

## 2024-07-22 MED ORDER — LABETALOL HCL 5 MG/ML IV SOLN
10.0000 mg | Freq: Four times a day (QID) | INTRAVENOUS | Status: DC | PRN
  Administered 2024-07-24 (×2): 10 mg via INTRAVENOUS
  Filled 2024-07-22 (×2): qty 4

## 2024-07-22 MED ORDER — POTASSIUM CHLORIDE IN NACL 20-0.9 MEQ/L-% IV SOLN
INTRAVENOUS | Status: AC
  Filled 2024-07-22 (×2): qty 1000

## 2024-07-22 MED ORDER — ORAL CARE MOUTH RINSE: 15.0000 mL | OROMUCOSAL | Status: DC | PRN

## 2024-07-22 NOTE — Plan of Care (Signed)
  Problem: Education: °Goal: Knowledge of General Education information will improve °Description: Including pain rating scale, medication(s)/side effects and non-pharmacologic comfort measures °Outcome: Progressing °  °Problem: Clinical Measurements: °Goal: Respiratory complications will improve °Outcome: Progressing °Goal: Cardiovascular complication will be avoided °Outcome: Progressing °  °Problem: Coping: °Goal: Level of anxiety will decrease °Outcome: Progressing °  °Problem: Elimination: °Goal: Will not experience complications related to urinary retention °Outcome: Progressing °  °Problem: Safety: °Goal: Ability to remain free from injury will improve °Outcome: Progressing °  °Problem: Skin Integrity: °Goal: Risk for impaired skin integrity will decrease °Outcome: Progressing °  °

## 2024-07-22 NOTE — Assessment & Plan Note (Signed)
 X-ray this AM shows tube not advanced into stomach. - Advance tube and repeat AXR - Continue IV fluids

## 2024-07-22 NOTE — Plan of Care (Signed)
" °  Problem: Clinical Measurements: Goal: Ability to maintain clinical measurements within normal limits will improve Outcome: Progressing   Problem: Elimination: Goal: Will not experience complications related to urinary retention Outcome: Progressing   Problem: Pain Managment: Goal: General experience of comfort will improve and/or be controlled Outcome: Progressing   Problem: Safety: Goal: Ability to remain free from injury will improve Outcome: Progressing   Problem: Activity: Goal: Risk for activity intolerance will decrease Outcome: Not Progressing   Problem: Nutrition: Goal: Adequate nutrition will be maintained Outcome: Not Progressing   "

## 2024-07-22 NOTE — Progress Notes (Signed)
 "    Subjective/Chief Complaint: No flatus/bm, ng being advanced overnight, has not gotten gg   Objective: Vital signs in last 24 hours: Temp:  [97.6 F (36.4 C)-98.8 F (37.1 C)] 98 F (36.7 C) (01/08 0643) Pulse Rate:  [54-81] 74 (01/08 0643) Resp:  [14-20] 18 (01/08 0643) BP: (174-203)/(76-98) 186/78 (01/08 0643) SpO2:  [93 %-100 %] 93 % (01/08 0643) Weight:  [65.6 kg] 65.6 kg (01/07 1632)    Intake/Output from previous day: 01/07 0701 - 01/08 0700 In: 1269.6 [I.V.:1077.9; IV Piggyback:191.7] Out: 1100 [Urine:1000; Emesis/NG output:100] Intake/Output this shift: Total I/O In: 1057.9 [I.V.:1057.9] Out: 1100 [Urine:1000; Emesis/NG output:100]  Ab soft nondistended nontender  Lab Results:  Recent Labs    07/21/24 0302  WBC 7.1  HGB 10.7*  HCT 33.6*  PLT 182   BMET Recent Labs    07/21/24 0302  NA 136  K 3.8  CL 101  CO2 24  GLUCOSE 118*  BUN 16  CREATININE 0.81  CALCIUM 9.8   PT/INR No results for input(s): LABPROT, INR in the last 72 hours. ABG No results for input(s): PHART, HCO3 in the last 72 hours.  Invalid input(s): PCO2, PO2  Studies/Results: DG Abd 1 View Result Date: 07/22/2024 EXAM: 1 VIEW XRAY OF THE ABDOMEN 07/22/2024 12:31:00 AM COMPARISON: 07/21/2024 CLINICAL HISTORY: Nasogastric tube present FINDINGS: LINES, TUBES AND DEVICES: Treatment tube tip projects over the junction advancement is recommended if placement in the stomach is desired. BOWEL: Multiple dilated loops of small bowel in the abdomen measuring up to 4.2 cm. Consistent with small bowel obstruction. No improvement since previous study. SOFT TISSUES: Right upper quadrant surgical clips, likely from cholecystectomy. No abnormal calcifications. BONES: Degenerative changes in the spine and hips. No acute fracture. IMPRESSION: 1. Nasogastric tube tip projects over the gastroesophageal junction; advancement is recommended if placement in the stomach is desired. 2. Small bowel  obstruction with no improvement since previous study. Electronically signed by: Elsie Gravely MD 07/22/2024 12:35 AM EST RP Workstation: [id number]   DG Abd 1 View Result Date: 07/21/2024 EXAM: 1 VIEW XRAY OF THE ABDOMEN 07/21/2024 10:31:00 PM COMPARISON: 07/21/2024 CLINICAL HISTORY: Encounter for nasogastric (NG) tube placement FINDINGS: LINES, TUBES AND DEVICES: Enteric tube in place with tip overlying Mid Esophagus and side port at Thoracic Inlet Level. Right chest Port-A-Cath in place with tip overlying Superior Cavoatrial Junction. BOWEL: Multiple loops of dilated small bowel within Central Abdomen measuring up to 5 cm, unchanged from Prior Study. SOFT TISSUES: Right Upper Quadrant surgical clips noted. No abnormal calcifications. BONES: No acute fracture. LUNGS: Bibasilar Atelectasis. IMPRESSION: 1. Enteric tube tip overlies the mid esophagus with side port at the thoracic inlet level; recommend advancement 12 cm. 2. Unchanged findings compatible with small bowel obstruction. Electronically signed by: Greig Pique MD MD 07/21/2024 10:36 PM EST RP Workstation: [id number]   DG Abdomen 1 View Result Date: 07/21/2024 CLINICAL DATA:  Evaluate nasogastric tube placement. EXAM: ABDOMEN - 1 VIEW COMPARISON:  07/21/2024 FINDINGS: Nasogastric tube tip is in the left upper abdomen probably near the gastric cardia. Central line tip is in the upper right atrium region. Again noted are dilated loops of small bowel in the central abdomen and gaseous distension of the stomach. Cholecystectomy clips. IMPRESSION: 1. Nasogastric tube tip is in the left upper abdomen, likely near the gastric cardia. 2. Persistent dilated loops of small bowel. Electronically Signed   By: Juliene Balder M.D.   On: 07/21/2024 12:18   DG Abd Portable 1V-Small Bowel Protocol-Position Verification  Result Date: 07/21/2024 EXAM: 1 VIEW XRAY OF THE ABDOMEN 07/21/2024 11:02:14 AM COMPARISON: 07/21/2024 CLINICAL HISTORY: Encounter for imaging study to  confirm nasogastric (NG) tube placement FINDINGS: LINES, TUBES AND DEVICES: Interval retraction of an enteric tube with a hair-pin loop overlying the mid to lower mediastinum with the tip coursing cranially collimated off view. BOWEL: Persistent multiple dilated loops of small bowel. SOFT TISSUES: Right upper quadrant surgical clips noted. No abnormal calcifications. BONES: No acute fracture. IMPRESSION: 1. Interval retraction of the enteric tube with a hairpin loop overlying the mid to lower mediastinum, with the tip coursing cranially and collimated off view. 2. Persistent multiple dilated loops of small bowel. Electronically signed by: Morgane Naveau MD 07/21/2024 11:10 AM EST RP Workstation: [id number]   DG Abd Portable 1 View Result Date: 07/21/2024 CLINICAL DATA:  NG tube placement. EXAM: PORTABLE ABDOMEN - 1 VIEW COMPARISON:  07/21/2024 at 3:17 a.m. FINDINGS: Nasogastric tube courses into the region of the distal esophagus and turns back 180 degrees continues up into the esophagus and off the superior image as tip is not visualized. Side-port appears to be visualized at the T6-7 level. This needs to be retracted significantly before being repositioned. Continued evidence of patient's known small bowel obstruction without significant change. Remainder of the exam is unchanged. IMPRESSION: 1. Nasogastric tube courses into the region of the distal esophagus and turns back 180 degrees and continues up into the esophagus and off the superior image as tip is not visualized. This needs to be retracted significantly before being repositioned. 2. Continued evidence of patient's known small bowel obstruction. Electronically Signed   By: Toribio Agreste M.D.   On: 07/21/2024 10:41   CT ABDOMEN PELVIS W CONTRAST Result Date: 07/21/2024 CLINICAL DATA:  Abdominal pain and constipation. History of multiple myeloma and melanoma as well as prostate cancer. EXAM: CT ABDOMEN AND PELVIS WITH CONTRAST TECHNIQUE: Multidetector  CT imaging of the abdomen and pelvis was performed using the standard protocol following bolus administration of intravenous contrast. RADIATION DOSE REDUCTION: This exam was performed according to the departmental dose-optimization program which includes automated exposure control, adjustment of the mA and/or kV according to patient size and/or use of iterative reconstruction technique. CONTRAST:  OMNIPAQUE  IOHEXOL  300 MG/ML  SOLN COMPARISON:  05/04/2024 FINDINGS: Lower chest: Dependent atelectasis noted in the lung bases, right greater than left. Hepatobiliary: 2 cm subcapsular lesion posterior right liver is stable, previously characterized as hemangioma. Gallbladder surgically absent. Common bile duct is 7-8 mm diameter, stable and upper normal to borderline increased for patient age although this may reflect sequelae of prior cholecystectomy. Pancreas: Similar diffuse dilatation of the main pancreatic duct. No obstructing mass lesion evident in the head of the pancreas. Spleen: No splenomegaly. No suspicious focal mass lesion. Adrenals/Urinary Tract: No adrenal nodule or mass. Stable simple cysts in both kidneys. Probable nonobstructing 3 mm stone upper pole right kidney. Tiny well-defined homogeneous low-density lesion in the left kidney is too small to characterize but is statistically most likely benign and probably a cyst. No followup imaging is recommended. Mild fullness noted in both intrarenal collecting systems and ureters. Bladder unremarkable. Stomach/Bowel: Stomach is markedly distended with food and fluid although antral region is collapsed, potentially secondary to peristalsis although distal gastric edema/inflammation/antritis is not excluded. Course of the duodenum is difficult to delineate in passage under the SMA cannot be confirmed although it did appear to pass into the SMA on the study from 01/20/2023. There is marked dilatation of small bowel loops  in the abdomen and pelvis measuring  up to 4.2 cm diameter in the abdomen and 3.9 cm diameter in the pelvis. Fecalization of enteric contents is most prominent in a loop of small bowel just anterior to the left psoas muscle in the lower abdomen that tracks into the pelvis where there is an apparent transition zone visible on axial image 70 of series 2 in the left pelvis also visible on coronal image 57 of series 7 as an abrupt narrowing. Terminal ileum and appendix are not well visualized. Small to moderate stool volume noted in the right and transverse colon as well as the rectum. Vascular/Lymphatic: No abdominal aortic aneurysm. No abdominal aortic atherosclerotic calcification. There is no gastrohepatic or hepatoduodenal ligament lymphadenopathy. No retroperitoneal or mesenteric lymphadenopathy. No pelvic sidewall lymphadenopathy. Reproductive: Prostatectomy. Other: Small volume free fluid is identified in the abdomen and pelvis. Musculoskeletal: Marked degenerative changes noted in both hips. Bones are heterogeneously mineralized with innumerable tiny lucent and sclerotic areas in the bony anatomy compatible with the patient's reported history of multiple myeloma. IMPRESSION: 1. Marked dilatation of small bowel loops in the abdomen and pelvis with fecalization of enteric contents in a small bowel loop of the lower abdomen and pelvis. This loop tracks into an apparent transition zone in the left pelvis. Imaging features are compatible with small bowel obstruction, likely mechanical with left pelvic adhesion a consideration. No evidence for small bowel wall thickening or pneumatosis at this time. 2. Stomach is markedly distended with food and fluid although antral region is collapsed, potentially secondary to peristalsis although distal gastric edema/inflammation is not excluded. 3. Small volume free fluid in the abdomen and pelvis. 4. Mild fullness in both intrarenal collecting systems and ureters. 5. Probable nonobstructing 3 mm stone upper pole  right kidney. 6. Bones are heterogeneously mineralized with innumerable tiny lucent and sclerotic areas in the bony anatomy compatible with the patient's reported history of multiple myeloma. Electronically Signed   By: Camellia Candle M.D.   On: 07/21/2024 06:04   DG Abdomen 1 View Result Date: 07/21/2024 EXAM: 1 VIEW XRAY OF THE ABDOMEN 07/21/2024 03:21:00 AM COMPARISON: CT abdomen and pelvis 05/04/2024. CLINICAL HISTORY: constipation FINDINGS: BOWEL: There are dilated upper to mid-abdominal small bowel segments measuring up to 4.6 cm concerning for a small bowel obstruction. There is mild to moderate retained stool. CT is recommended. SOFT TISSUES: Cholecystectomy clips. BONES: No acute fracture. IMPRESSION: 1. Mild to moderate retained stool. 2. Dilated upper to mid-abdominal small bowel segments measuring up to 4.6 cm, concerning for small bowel obstruction. CT is recommended. Electronically signed by: Francis Quam MD 07/21/2024 03:36 AM EST RP Workstation: [id number]    Anti-infectives: Anti-infectives (From admission, onward)    None       Assessment/Plan: SBO likely adhesive -no indication for operation -ng wasn't really functional much of yesterday apparently. It is not functional. Discussed with nurse at bedside suction for 2 hours then proceed with gg protocol -will follow  I reviewed hospitalist notes, last 24 h vitals and pain scores, last 48 h intake and output, last 24 h labs and trends, and last 24 h imaging results  Travis Nguyen 07/22/2024  "

## 2024-07-22 NOTE — Progress Notes (Signed)
" °   07/22/24 1357  TOC Brief Assessment  Insurance and Status Reviewed  Patient has primary care physician Yes Bertie, David, MD)  Home environment has been reviewed Home with spouse  Prior level of function: Independent  Prior/Current Home Services No current home services  Social Drivers of Health Review SDOH reviewed no interventions necessary  Readmission risk has been reviewed Yes  Transition of care needs no transition of care needs at this time    "

## 2024-07-22 NOTE — Assessment & Plan Note (Signed)
 BP elevated - Continue hydralazine  PRN - Start labetalol  PRN - Hold home amlodipine , losartan  until able to take PO

## 2024-07-22 NOTE — Progress Notes (Signed)
 2100: NGT marking at nare is at 37, charge nurse verified marking at the nare.  2305 NGT advanced 12 cm per radiology and floor coverage NP, now at 49 cm  0130 Advance NGT 5 cm, ok to use if no resistance is felt per A. Chavez NP. NGT now at 54 cm and connected to LIS

## 2024-07-22 NOTE — Assessment & Plan Note (Signed)
 Resolved

## 2024-07-22 NOTE — Progress Notes (Signed)
" °  Progress Note   Patient: Travis Nguyen FMW:[id number] DOB: 10-Jan-1951 DOA: 07/21/2024     1 DOS: the patient was seen and examined on 07/22/2024 at 10:12 AM      Brief hospital course: 74 y.o. M with MM on carfilzomib  and daratumumab , HTN, hx prostatectomy, hx SBO who presented with perimbilical pain and vomiting, CT confirmed SBO, NG tube placed.     Assessment and Plan: * SBO (small bowel obstruction) (HCC) X-ray this AM shows tube not advanced into stomach. - Advance tube and repeat AXR - Continue IV fluids  Sinus bradycardia Resolved  Essential hypertension BP elevated - Continue hydralazine  PRN - Start labetalol  PRN - Hold home amlodipine , losartan  until able to take PO  Multiple myeloma (HCC)            Subjective: Patient's abdominal pain is improved.  He has had no vomiting.  He is having some burping.  Has had no gas or bowel movement.     Physical Exam: BP (!) 186/78 (BP Location: Left Arm)   Pulse 74   Temp 98 F (36.7 C)   Resp 18   Ht 6' 1 (1.854 m)   Wt 65.6 kg   SpO2 93%   BMI 19.09 kg/m   Adult male, lying in bed, interactive and appropriate RRR, no murmurs, no peripheral edema Respiratory rate normal, lungs clear without rales or wheezes Abdomen without distention, no focal tenderness to palpation, NG tube in place Attention normal, affect pleasant, judgment and insight appear normal, face symmetric, speech fluent, moves upper extremities with normal strength and coordination    Data Reviewed: Basic metabolic panel shows normal electrolytes and renal function CBC shows mild stable anemia Abdomen x-ray personally reviewed, shows tip of NG tube in the stomach   Family Communication:     Disposition: Status is: Inpatient         Author: Lonni SHAUNNA Dalton, MD 07/22/2024 12:25 PM  For on call review [email or web address]    "

## 2024-07-22 NOTE — Progress Notes (Signed)
 Ng tube advanced an additional 5cm, per MD, now at 59cm. Repeat x ray ordered

## 2024-07-22 NOTE — Hospital Course (Addendum)
 74 y.o. M with MM on carfilzomib  and daratumumab , HTN, hx prostatectomy, hx SBO who presented with perimbilical pain and vomiting, CT confirmed SBO, NG tube placed. General Surgery consulted and he has failed conservative management and underwent Surgical intervention on 07/28/24.  Hospitalization has been complicated by post-operative ileus which ended up him getting TPN for Nutrition. Now slowly improving and diet has been advanced to Soft Diet. TPN weaning and he improved and was at baseline.  He is medically stable for discharge and surgery has cleared him for discharge as well.  PT recommended no follow-up.  Referral with PCP, general surgery and oncology in outpatient setting as he is able to tolerate soft diet  Assessment and Plan:  Small Bowel Obstruction-he was admitted to the hospital, NG tube was placed.  He did not improve with conservative management and he was taken to the OR on 1/14 and underwent laparoscopic LOA, small bowel resection, appendicectomy (POD 9) -Post-Operative course complicated by ileus, which was not resolving and he was started on TPN.  Now having return bowel function; Surgery following, appreciate input, diet advancement per surgery; Now NG has been removed (1/16) and he has been placed on SOFT Diet as he is now having bowel movements.  -TPN is being weaned off.  Able to tolerate soft diet without issues.  PT/OT recommending no follow up.   Sinus Bradycardia-stable no issues and improved as it seemed to be vasovagal. HR ranging in the 60's to 80's now    Essential Hypertension-blood pressure acceptable.  Continue IV Hydralazine  10 mg q4hprn for SBP >159. CTM BP per Protocol. Last BP reading was 149/83   HLD/Pure Hypercholesterolemia: Currently not on any medical Tx. Follow up in the outpatient setting w/ PCP  Abnormal LFTs: Likely in the setting of TPN.  Mildly elevated so recommend outpatient follow-up and if still elevated on recheck within 1 week recommend right  upper quadrant ultrasound and acute hepatitis panel  Hyponatremia: Mild and Fluctuating. Na+ Trend:  Recent Labs  Lab 07/31/24 0223 08/01/24 0552 08/02/24 0427 08/03/24 0454 08/04/24 0509 08/05/24 0459 08/06/24 0357  NA 136 134* 138 136 135 134* 133*  -CTM & Trend & repeat CMP within 1 week  Multiple Myeloma-outpatient management and will need F/u w/ Oncology    Normocytic Anemia-in the setting of underlying malignancy. Hgb/Hct Trend stable now on the last 3 checks: Recent Labs  Lab 07/30/24 0237 07/31/24 0223 08/01/24 0552 08/03/24 0454 08/04/24 0509 08/05/24 0459 08/06/24 0357  HGB 9.4* 8.6* 9.1* 8.1* 8.2* 8.1* 8.2*  HCT 29.1* 26.6* 28.5* 24.9* 25.3* 24.9* 26.8*  MCV 104.3* 103.1* 103.6* 101.6* 100.8* 100.8* 104.3*  -Check Anemia Panel and showed an iron level of 43, UIBC of 265, TIBC of 308, saturation ratios of 14%, ferritin level 445, folate level 11.2 and a vitamin B12 level of 1321 -Closely monitor hemoglobin postoperatively, overall stable, no bleeding; Repeat CBC in the within 1 week   Thrombocytosis: Likely reactive as Plt Count Trend slightly worsening: Recent Labs  Lab 07/30/24 0237 07/31/24 0223 08/01/24 0552 08/03/24 0454 08/04/24 0509 08/05/24 0459 08/06/24 0357  PLT 339 346 261 383 409* 432* 467*  -CTM and Trend and Repeat CBC within 1 week  Hypoalbuminemia: Patient's Albumin Lvl ranging from 2.9-3.4 (was 3.4 this AM). CTM & Trend & repeat CMP in the AM  Severe Malnutrition in the context of chronic illness / Underweight: Nutrition Status: Nutrition Problem: Severe Malnutrition Etiology: acute illness (SBO) Signs/Symptoms: severe fat depletion, severe muscle depletion, energy intake <  75% for > 7 days Interventions: TPN -Also on Ensure Plus High Protein po BID

## 2024-07-22 NOTE — Assessment & Plan Note (Signed)
 Travis Nguyen

## 2024-07-23 ENCOUNTER — Inpatient Hospital Stay

## 2024-07-23 ENCOUNTER — Inpatient Hospital Stay (HOSPITAL_COMMUNITY)

## 2024-07-23 ENCOUNTER — Encounter (HOSPITAL_COMMUNITY): Payer: Self-pay | Admitting: Nurse Practitioner

## 2024-07-23 DIAGNOSIS — K56609 Unspecified intestinal obstruction, unspecified as to partial versus complete obstruction: Secondary | ICD-10-CM | POA: Diagnosis not present

## 2024-07-23 LAB — COMPREHENSIVE METABOLIC PANEL WITH GFR
ALT: 13 U/L (ref 0–44)
AST: 15 U/L (ref 15–41)
Albumin: 3.8 g/dL (ref 3.5–5.0)
Alkaline Phosphatase: 65 U/L (ref 38–126)
Anion gap: 11 (ref 5–15)
BUN: 13 mg/dL (ref 8–23)
CO2: 21 mmol/L — ABNORMAL LOW (ref 22–32)
Calcium: 9.1 mg/dL (ref 8.9–10.3)
Chloride: 106 mmol/L (ref 98–111)
Creatinine, Ser: 0.76 mg/dL (ref 0.61–1.24)
GFR, Estimated: >60 mL/min
Glucose, Bld: 85 mg/dL (ref 70–99)
Potassium: 4.4 mmol/L (ref 3.5–5.1)
Sodium: 138 mmol/L (ref 135–145)
Total Bilirubin: 0.6 mg/dL (ref 0.0–1.2)
Total Protein: 7 g/dL (ref 6.5–8.1)

## 2024-07-23 LAB — GLUCOSE, CAPILLARY: Glucose-Capillary: 89 mg/dL (ref 70–99)

## 2024-07-23 LAB — CBC
HCT: 33.7 % — ABNORMAL LOW (ref 39.0–52.0)
Hemoglobin: 10.9 g/dL — ABNORMAL LOW (ref 13.0–17.0)
MCH: 33.3 pg (ref 26.0–34.0)
MCHC: 32.3 g/dL (ref 30.0–36.0)
MCV: 103.1 fL — ABNORMAL HIGH (ref 80.0–100.0)
Platelets: 205 K/uL (ref 150–400)
RBC: 3.27 MIL/uL — ABNORMAL LOW (ref 4.22–5.81)
RDW: 14.6 % (ref 11.5–15.5)
WBC: 3.3 K/uL — ABNORMAL LOW (ref 4.0–10.5)
nRBC: 0 % (ref 0.0–0.2)

## 2024-07-23 MED ORDER — CHLORHEXIDINE GLUCONATE CLOTH 2 % EX PADS
6.0000 | MEDICATED_PAD | Freq: Every day | CUTANEOUS | Status: DC
  Administered 2024-07-23 – 2024-08-06 (×15): 6 via TOPICAL

## 2024-07-23 MED ORDER — SODIUM CHLORIDE 0.9% FLUSH: 10.0000 mL | INTRAVENOUS | Status: DC | PRN

## 2024-07-23 MED ORDER — LIDOCAINE-PRILOCAINE 2.5-2.5 % EX CREA
TOPICAL_CREAM | Freq: Once | CUTANEOUS | Status: AC
  Filled 2024-07-23: qty 5

## 2024-07-23 MED ORDER — SODIUM CHLORIDE 0.9% FLUSH
10.0000 mL | Freq: Two times a day (BID) | INTRAVENOUS | Status: DC
  Administered 2024-07-23 – 2024-07-29 (×11): 10 mL
  Administered 2024-07-29: 20 mL
  Administered 2024-07-30 – 2024-08-06 (×7): 10 mL

## 2024-07-23 MED ORDER — HYDROMORPHONE HCL 1 MG/ML IJ SOLN
0.5000 mg | INTRAMUSCULAR | Status: DC | PRN
  Administered 2024-07-23 – 2024-07-26 (×20): 0.5 mg via INTRAVENOUS
  Filled 2024-07-23 (×22): qty 0.5

## 2024-07-23 MED ORDER — POTASSIUM CHLORIDE IN NACL 20-0.9 MEQ/L-% IV SOLN
INTRAVENOUS | Status: DC
  Filled 2024-07-23 (×3): qty 1000

## 2024-07-23 NOTE — Progress Notes (Signed)
" °  Progress Note   Patient: Travis Nguyen FMW:[id number] DOB: [birth year] DOA: 07/21/2024     2 DOS: the patient was seen and examined on 07/23/2024 at 9:48 AM      Brief hospital course: 74 y.o. M with MM on carfilzomib  and daratumumab , HTN, hx prostatectomy, hx SBO who presented with perimbilical pain and vomiting, CT confirmed SBO, NG tube placed.     Assessment and Plan: * SBO (small bowel obstruction) (HCC) Serial x-rays this a.m. showed no passage of contrast into the colon.  He removed his NG tube twice today.  Replaced once earlier today. - Defer replacement of NG second time to general surgery - IV fluids - N.p.o.  Sinus bradycardia Resolved  Essential hypertension Blood pressure remains elevated - Continue hydralazine  and labetalol  as needed - Resume amlodipine  and losartan  when able to take p.o.  Multiple myeloma (HCC)            Subjective: Abdominal pain is improved.  No fever, no respiraotry symptoms.     Physical Exam: BP (!) 190/94   Pulse 78   Temp 98.3 F (36.8 C) (Oral)   Resp 16   Ht 6' 1 (1.854 m)   Wt 65.6 kg   SpO2 99%   BMI 19.09 kg/m   Adult male, thin, sitting in bed, interactive and appropriate RRR, no murmurs, no peripheral edema Respiratory rate normal, lungs clear without rales or wheezes Abdomen diffusely tender, but mild, no guarding, no grimace to palpation, no firmness Attention normal, affect normal, judgment and insight appear normal    Data Reviewed: Discussed with general surgery PA CBC shows mild anemia, basic metabolic panel shows     Family Communication:     Disposition: Status is: Inpatient         Author: Lonni SHAUNNA Dalton, MD 07/23/2024 5:17 PM  For on call review [email or web address]    "

## 2024-07-23 NOTE — Progress Notes (Signed)
 Found NG tube on patients bed again, patient is unsure how it came out. Discussed with Dr.Danford.

## 2024-07-23 NOTE — Plan of Care (Signed)
  Problem: Activity: Goal: Risk for activity intolerance will decrease Outcome: Progressing   Problem: Nutrition: Goal: Adequate nutrition will be maintained Outcome: Progressing   Problem: Coping: Goal: Level of anxiety will decrease Outcome: Progressing   Problem: Pain Managment: Goal: General experience of comfort will improve and/or be controlled Outcome: Progressing

## 2024-07-23 NOTE — Plan of Care (Signed)

## 2024-07-23 NOTE — Progress Notes (Signed)
 "    Subjective/Chief Complaint: Feels better   Objective: Vital signs in last 24 hours: Temp:  [97.8 F (36.6 C)-99.4 F (37.4 C)] 97.8 F (36.6 C) (01/09 0420) Pulse Rate:  [66-75] 66 (01/09 0420) Resp:  [16-18] 18 (01/09 0420) BP: (159-187)/(75-99) 187/96 (01/09 0742) SpO2:  [100 %] 100 % (01/09 0420) Last BM Date : 07/15/24  Intake/Output from previous day: 01/08 0701 - 01/09 0700 In: 1713.2 [I.V.:1713.2] Out: 775 [Urine:575; Emesis/NG output:200] Intake/Output this shift: Total I/O In: -  Out: 300 [Urine:300]  General appearance: alert and cooperative Resp: clear to auscultation bilaterally Cardio: regular rate and rhythm GI: Soft, mild tenderness  Lab Results:  Recent Labs    07/22/24 0734 07/23/24 0724  WBC 4.4 3.3*  HGB 11.6* 10.9*  HCT 35.3* 33.7*  PLT 175 205   BMET Recent Labs    07/22/24 0734 07/23/24 0724  NA 135 138  K 4.8 4.4  CL 101 106  CO2 23 21*  GLUCOSE 80 85  BUN 9 13  CREATININE 0.69 0.76  CALCIUM 9.1 9.1   PT/INR No results for input(s): LABPROT, INR in the last 72 hours. ABG No results for input(s): PHART, HCO3 in the last 72 hours.  Invalid input(s): PCO2, PO2  Studies/Results: DG Abd Portable 1V-Small Bowel Obstruction Protocol-initial, 8 hr delay Result Date: 07/22/2024 EXAM: 1 VIEW XRAY OF THE ABDOMEN 07/22/2024 11:17:00 PM COMPARISON: 07/22/2024 CLINICAL HISTORY: FINDINGS: LINES, TUBES AND DEVICES: Enteric tube in place with tip in gastric fundus. BOWEL: Persistently dilated loops of small bowel throughout abdomen. Examination is obtained 8 hours after administration of oral contrast material. Dilute contrast material is demonstrated within dilated small bowel. No contrast material is yet seen in the colon. Appearance is consistent with small bowel obstruction. SOFT TISSUES: Cholecystectomy clips in right upper quadrant. No abnormal calcifications. BONES: No acute fracture. IMPRESSION: 1. Small bowel obstruction,  with no oral contrast in the colon at 8 hours. 2. Enteric tube tip in the gastric fundus. Electronically signed by: Elsie Gravely MD 07/22/2024 11:24 PM EST RP Workstation: [id number]   DG Abd 1 View Result Date: 07/22/2024 CLINICAL DATA:  Nasogastric tube placement EXAM: DG ABDOMEN 1V COMPARISON:  Same day FINDINGS: Nasogastric tube tip is seen in proximal stomach. Stable small bowel dilatation is noted. Status post cholecystectomy. IMPRESSION: Nasogastric tube tip seen in proximal stomach. Electronically Signed   By: Lynwood Landy Raddle M.D.   On: 07/22/2024 18:10   DG Abd 1 View Result Date: 07/22/2024 CLINICAL DATA:  History gastric tube placement. EXAM: DG ABDOMEN 1V COMPARISON:  07/21/2024 and 07/22/2024 FINDINGS: Interval advancement of nasogastric tube now with tip over the stomach in the left upper quadrant and side-port over the expected region of the gastroesophageal junction. This could be advanced another 5 cm. Continued evidence of multiple dilated central air-filled small bowel loops compatible patient's small-bowel obstruction. No free peritoneal air. Remainder of the exam is unchanged. IMPRESSION: 1. Interval advancement of nasogastric tube with tip over the stomach in the left upper quadrant and side-port over the expected region of the gastroesophageal junction. This could be advanced another 5 cm. 2. Continued evidence of small-bowel obstruction. Electronically Signed   By: Toribio Agreste M.D.   On: 07/22/2024 09:12   DG Abd 1 View Result Date: 07/22/2024 EXAM: 1 VIEW XRAY OF THE ABDOMEN 07/22/2024 12:31:00 AM COMPARISON: 07/21/2024 CLINICAL HISTORY: Nasogastric tube present FINDINGS: LINES, TUBES AND DEVICES: Treatment tube tip projects over the junction advancement is recommended if placement  in the stomach is desired. BOWEL: Multiple dilated loops of small bowel in the abdomen measuring up to 4.2 cm. Consistent with small bowel obstruction. No improvement since previous study. SOFT TISSUES:  Right upper quadrant surgical clips, likely from cholecystectomy. No abnormal calcifications. BONES: Degenerative changes in the spine and hips. No acute fracture. IMPRESSION: 1. Nasogastric tube tip projects over the gastroesophageal junction; advancement is recommended if placement in the stomach is desired. 2. Small bowel obstruction with no improvement since previous study. Electronically signed by: Elsie Gravely MD 07/22/2024 12:35 AM EST RP Workstation: [id number]   DG Abd 1 View Result Date: 07/21/2024 EXAM: 1 VIEW XRAY OF THE ABDOMEN 07/21/2024 10:31:00 PM COMPARISON: 07/21/2024 CLINICAL HISTORY: Encounter for nasogastric (NG) tube placement FINDINGS: LINES, TUBES AND DEVICES: Enteric tube in place with tip overlying Mid Esophagus and side port at Thoracic Inlet Level. Right chest Port-A-Cath in place with tip overlying Superior Cavoatrial Junction. BOWEL: Multiple loops of dilated small bowel within Central Abdomen measuring up to 5 cm, unchanged from Prior Study. SOFT TISSUES: Right Upper Quadrant surgical clips noted. No abnormal calcifications. BONES: No acute fracture. LUNGS: Bibasilar Atelectasis. IMPRESSION: 1. Enteric tube tip overlies the mid esophagus with side port at the thoracic inlet level; recommend advancement 12 cm. 2. Unchanged findings compatible with small bowel obstruction. Electronically signed by: Greig Pique MD MD 07/21/2024 10:36 PM EST RP Workstation: [id number]   DG Abdomen 1 View Result Date: 07/21/2024 CLINICAL DATA:  Evaluate nasogastric tube placement. EXAM: ABDOMEN - 1 VIEW COMPARISON:  07/21/2024 FINDINGS: Nasogastric tube tip is in the left upper abdomen probably near the gastric cardia. Central line tip is in the upper right atrium region. Again noted are dilated loops of small bowel in the central abdomen and gaseous distension of the stomach. Cholecystectomy clips. IMPRESSION: 1. Nasogastric tube tip is in the left upper abdomen, likely near the gastric cardia. 2.  Persistent dilated loops of small bowel. Electronically Signed   By: Juliene Balder M.D.   On: 07/21/2024 12:18   DG Abd Portable 1V-Small Bowel Protocol-Position Verification Result Date: 07/21/2024 EXAM: 1 VIEW XRAY OF THE ABDOMEN 07/21/2024 11:02:14 AM COMPARISON: 07/21/2024 CLINICAL HISTORY: Encounter for imaging study to confirm nasogastric (NG) tube placement FINDINGS: LINES, TUBES AND DEVICES: Interval retraction of an enteric tube with a hair-pin loop overlying the mid to lower mediastinum with the tip coursing cranially collimated off view. BOWEL: Persistent multiple dilated loops of small bowel. SOFT TISSUES: Right upper quadrant surgical clips noted. No abnormal calcifications. BONES: No acute fracture. IMPRESSION: 1. Interval retraction of the enteric tube with a hairpin loop overlying the mid to lower mediastinum, with the tip coursing cranially and collimated off view. 2. Persistent multiple dilated loops of small bowel. Electronically signed by: Morgane Naveau MD 07/21/2024 11:10 AM EST RP Workstation: [id number]   DG Abd Portable 1 View Result Date: 07/21/2024 CLINICAL DATA:  NG tube placement. EXAM: PORTABLE ABDOMEN - 1 VIEW COMPARISON:  07/21/2024 at 3:17 a.m. FINDINGS: Nasogastric tube courses into the region of the distal esophagus and turns back 180 degrees continues up into the esophagus and off the superior image as tip is not visualized. Side-port appears to be visualized at the T6-7 level. This needs to be retracted significantly before being repositioned. Continued evidence of patient's known small bowel obstruction without significant change. Remainder of the exam is unchanged. IMPRESSION: 1. Nasogastric tube courses into the region of the distal esophagus and turns back 180 degrees and continues up into the esophagus and  off the superior image as tip is not visualized. This needs to be retracted significantly before being repositioned. 2. Continued evidence of patient's known small bowel  obstruction. Electronically Signed   By: Toribio Agreste M.D.   On: 07/21/2024 10:41    Anti-infectives: Anti-infectives (From admission, onward)    None       Assessment/Plan: s/p * No surgery found * SBO Continue NG and bowel rest Abdominal x-ray still show dilated loops of small bowel but seems to be slightly improved compared to yesterday We will follow closely with you  LOS: 2 days    Deward Null III 07/23/2024  "

## 2024-07-23 NOTE — Progress Notes (Signed)
 Observed patients NG tube on bed, patient does not recall it falling out. Notified Dr.Danford, new NG tube to be placed.  New NG tube placed, Xray ordered for placement verification. Pending hook up back to suction. MD notified.

## 2024-07-24 ENCOUNTER — Inpatient Hospital Stay (HOSPITAL_COMMUNITY)

## 2024-07-24 DIAGNOSIS — K56609 Unspecified intestinal obstruction, unspecified as to partial versus complete obstruction: Secondary | ICD-10-CM | POA: Diagnosis not present

## 2024-07-24 LAB — BASIC METABOLIC PANEL WITH GFR
Anion gap: 11 (ref 5–15)
BUN: 14 mg/dL (ref 8–23)
CO2: 20 mmol/L — ABNORMAL LOW (ref 22–32)
Calcium: 8.7 mg/dL — ABNORMAL LOW (ref 8.9–10.3)
Chloride: 108 mmol/L (ref 98–111)
Creatinine, Ser: 0.67 mg/dL (ref 0.61–1.24)
GFR, Estimated: >60 mL/min
Glucose, Bld: 89 mg/dL (ref 70–99)
Potassium: 4.3 mmol/L (ref 3.5–5.1)
Sodium: 139 mmol/L (ref 135–145)

## 2024-07-24 LAB — CBC
HCT: 31.5 % — ABNORMAL LOW (ref 39.0–52.0)
Hemoglobin: 10 g/dL — ABNORMAL LOW (ref 13.0–17.0)
MCH: 33.3 pg (ref 26.0–34.0)
MCHC: 31.7 g/dL (ref 30.0–36.0)
MCV: 105 fL — ABNORMAL HIGH (ref 80.0–100.0)
Platelets: 202 K/uL (ref 150–400)
RBC: 3 MIL/uL — ABNORMAL LOW (ref 4.22–5.81)
RDW: 14.9 % (ref 11.5–15.5)
WBC: 3.2 K/uL — ABNORMAL LOW (ref 4.0–10.5)
nRBC: 0 % (ref 0.0–0.2)

## 2024-07-24 MED ORDER — OXYCODONE HCL 5 MG PO TABS
5.0000 mg | ORAL_TABLET | Freq: Once | ORAL | Status: AC
  Administered 2024-07-24: 5 mg via ORAL
  Filled 2024-07-24: qty 1

## 2024-07-24 NOTE — Progress Notes (Signed)
 Unable to monitor stool occurences. Pt refused for this RN and NT to assist with peri care.

## 2024-07-24 NOTE — Plan of Care (Signed)
  Problem: Education: Goal: Knowledge of General Education information will improve Description: Including pain rating scale, medication(s)/side effects and non-pharmacologic comfort measures Outcome: Progressing   Problem: Clinical Measurements: Goal: Will remain free from infection Outcome: Progressing Goal: Diagnostic test results will improve Outcome: Progressing   Problem: Activity: Goal: Risk for activity intolerance will decrease Outcome: Progressing   Problem: Elimination: Goal: Will not experience complications related to urinary retention Outcome: Progressing   Problem: Pain Managment: Goal: General experience of comfort will improve and/or be controlled Outcome: Progressing

## 2024-07-24 NOTE — Progress Notes (Signed)
 Pt educated on need for NG tube in order to promote bowel rest, alleviate symptoms, and help avoid adverse outcomes. Pt agreed to the replacement of NG tube. NGT at 55 at R nare, pt tolerated procedure well. Advanced 5cm per x-ray and approved for use by Abigail Chavez NP. Low intermittent suction set to and output noted to be green.

## 2024-07-24 NOTE — Progress Notes (Signed)
 "    Subjective/Chief Complaint: Feels better. No flatus yet   Objective: Vital signs in last 24 hours: Temp:  [98.3 F (36.8 C)-99 F (37.2 C)] 98.6 F (37 C) (01/10 0512) Pulse Rate:  [74-80] 80 (01/10 1021) Resp:  [16-18] 18 (01/10 0512) BP: (169-190)/(89-94) 190/89 (01/10 1021) SpO2:  [99 %-100 %] 100 % (01/10 0512) Last BM Date : 07/23/24  Intake/Output from previous day: 01/09 0701 - 01/10 0700 In: 1098.5 [I.V.:1068.5; NG/GT:30] Out: 900 [Urine:900] Intake/Output this shift: Total I/O In: 20 [I.V.:20] Out: -   General appearance: alert and cooperative Resp: clear to auscultation bilaterally Cardio: regular rate and rhythm GI: soft, minimal tenderness. Mild distension  Lab Results:  Recent Labs    07/23/24 0724 07/24/24 0411  WBC 3.3* 3.2*  HGB 10.9* 10.0*  HCT 33.7* 31.5*  PLT 205 202   BMET Recent Labs    07/23/24 0724 07/24/24 0411  NA 138 139  K 4.4 4.3  CL 106 108  CO2 21* 20*  GLUCOSE 85 89  BUN 13 14  CREATININE 0.76 0.67  CALCIUM 9.1 8.7*   PT/INR No results for input(s): LABPROT, INR in the last 72 hours. ABG No results for input(s): PHART, HCO3 in the last 72 hours.  Invalid input(s): PCO2, PO2  Studies/Results: DG Abd Portable 1V Result Date: 07/24/2024 CLINICAL DATA:  Small bowel obstruction. EXAM: PORTABLE ABDOMEN - 1 VIEW COMPARISON:  07/23/2024 FINDINGS: Dilated small bowel loops again noted measuring up to 5.6 cm diameter over the upper abdomen. Gas and stool again seen in the colon. NG tube is visualized with both the tip and proximal side port below the GE junction. IMPRESSION: 1. Persistent small bowel obstruction. 2. NG tube tip and proximal side port below the GE junction. Electronically Signed   By: Camellia Candle M.D.   On: 07/24/2024 07:39   DG Abd 1 View Result Date: 07/24/2024 EXAM: 1 VIEW XRAY OF THE ABDOMEN 07/24/2024 12:04:00 AM COMPARISON: 07/23/2024 CLINICAL HISTORY: Encounter for nasogastric (NG) tube  placement FINDINGS: LINES, TUBES AND DEVICES: Interval advancement of an enteric tube in place with tip projecting over gastric fundus and side port likely at the gastroesophageal junction. BOWEL: Multiple gas-filled dilated loops of small bowel within central abdomen. SOFT TISSUES: Surgical clips in right upper quadrant. BONES: No acute fracture. IMPRESSION: 1. Interval advancement of an enteric tube in place with tip projecting over gastric fundus and side port likely at the gastroesophageal junction. 2. Multiple gas-filled dilated loops of small bowel within the central abdomen, consistent with small bowel obstruction. Electronically signed by: Morgane Naveau MD MD 07/24/2024 12:35 AM EST RP Workstation: [id number]   DG Abd 1 View Result Date: 07/23/2024 CLINICAL DATA:  Nasogastric tube present. EXAM: ABDOMEN - 1 VIEW COMPARISON:  There today FINDINGS: Tip of the enteric tube is below the diaphragm, the side port is in the region of the distal esophagus. Small amount of contrast persists within the proximal stomach. Dilute contrast is seen within partially imaged small bowel as before. IMPRESSION: Tip of the enteric tube below the diaphragm, the side port is in the region of the distal esophagus. Recommend advancement of at least 3-4 cm to place the side-port below the diaphragm. Electronically Signed   By: Andrea Gasman M.D.   On: 07/23/2024 16:17   DG Abd Portable 1V Result Date: 07/23/2024 CLINICAL DATA:  Enteric tube positioned EXAM: PORTABLE ABDOMEN - 1 VIEW COMPARISON:  Abdominal radiograph dated 07/22/2024 FINDINGS: Gastric/enteric tube tip projects over the  stomach. Partially imaged central venous catheter tip terminates over the superior cavoatrial junction. Decreased contrast material within the stomach. Dilute contrast material seen throughout partially imaged dilated small bowel loops. Right upper quadrant surgical clips. IMPRESSION: 1. Gastric/enteric tube tip projects over the stomach. 2.  Decreased contrast material within the stomach. Dilute contrast material seen throughout partially imaged dilated small bowel loops. Electronically Signed   By: Limin  Xu M.D.   On: 07/23/2024 10:53   DG Abd 1 View Result Date: 07/23/2024 CLINICAL DATA:  Enteric tube placement EXAM: ABDOMEN - 1 VIEW COMPARISON:  Earlier same day abdominal radiograph at 7:46 a.m. FINDINGS: Gastric/enteric tube tip projects over the distal esophagus/gastroesophageal junction. Partially imaged right chest wall port tip terminates over the superior cavoatrial junction. Small volume enteric contrast material remains within the stomach. Dilute contrast is seen throughout the partially imaged dilated small bowel. Right upper quadrant surgical clips. IMPRESSION: 1. Gastric/enteric tube tip projects over the distal esophagus/gastroesophageal junction. Recommend advancement. 2. Small volume enteric contrast material remains within the stomach. Dilute contrast is seen throughout the partially imaged dilated small bowel. Electronically Signed   By: Limin  Xu M.D.   On: 07/23/2024 10:52   DG Abd Portable 1V-Small Bowel Obstruction Protocol-initial, 8 hr delay Result Date: 07/22/2024 EXAM: 1 VIEW XRAY OF THE ABDOMEN 07/22/2024 11:17:00 PM COMPARISON: 07/22/2024 CLINICAL HISTORY: FINDINGS: LINES, TUBES AND DEVICES: Enteric tube in place with tip in gastric fundus. BOWEL: Persistently dilated loops of small bowel throughout abdomen. Examination is obtained 8 hours after administration of oral contrast material. Dilute contrast material is demonstrated within dilated small bowel. No contrast material is yet seen in the colon. Appearance is consistent with small bowel obstruction. SOFT TISSUES: Cholecystectomy clips in right upper quadrant. No abnormal calcifications. BONES: No acute fracture. IMPRESSION: 1. Small bowel obstruction, with no oral contrast in the colon at 8 hours. 2. Enteric tube tip in the gastric fundus. Electronically signed by:  Elsie Gravely MD 07/22/2024 11:24 PM EST RP Workstation: [id number]   DG Abd 1 View Result Date: 07/22/2024 CLINICAL DATA:  Nasogastric tube placement EXAM: DG ABDOMEN 1V COMPARISON:  Same day FINDINGS: Nasogastric tube tip is seen in proximal stomach. Stable small bowel dilatation is noted. Status post cholecystectomy. IMPRESSION: Nasogastric tube tip seen in proximal stomach. Electronically Signed   By: Lynwood Landy Raddle M.D.   On: 07/22/2024 18:10    Anti-infectives: Anti-infectives (From admission, onward)    None       Assessment/Plan: s/p * No surgery found * sbo Continue ng and bowel rest No progress so far but he feels better and says this normally resolves without surgery Repeat abd xrays tomorrow  LOS: 3 days    Travis Nguyen III 07/24/2024  "

## 2024-07-24 NOTE — Progress Notes (Signed)
 NG tube clamped per order. Advanced to clears. Educated pt to notify nurse of abdominal pain or nausea/vomiting. Pt verbalized understanding of plan of care.

## 2024-07-24 NOTE — Plan of Care (Signed)

## 2024-07-24 NOTE — Progress Notes (Signed)
" °  Progress Note   Patient: Travis Nguyen FMW:[id number] DOB: 1951-01-29 DOA: 07/21/2024     3 DOS: the patient was seen and examined on 07/24/2024       Brief hospital course: 74 y.o. M with MM on carfilzomib  and daratumumab , HTN, hx prostatectomy, hx SBO who presented with perimbilical pain and vomiting, CT confirmed SBO, NG tube placed.     Assessment and Plan: * SBO (small bowel obstruction) (HCC) NG tube accidentally out again yesterday afternoon, replaced overnight.  This morning he reports he had a brown bowel movement. - Repeat abdominal x-ray - If nursing observed bowel movement, or x-ray shows contrast in the colon will clamp tube and advance to clears - Continue IV fluids   Sinus bradycardia Resolved  Essential hypertension Blood pressure remains elevated - Continue as needed hydralazine  and labetalol  - Hold home amlodipine , losartan  until able to take PO  Multiple myeloma (HCC) Normocytic anemia Leukopenia Stable          Subjective: Patient reports having a bowel movement overnight.  His pain is well-controlled with IV pain medicines.  No new distention.  No confusion or fever.     Physical Exam: BP (!) 190/89 (BP Location: Left Arm)   Pulse 80   Temp 98.6 F (37 C) (Oral)   Resp 18   Ht 6' 1 (1.854 m)   Wt 65.6 kg   SpO2 100%   BMI 19.09 kg/m   Thin adult male, sitting up in bed, interactive and appropriate, NG tube in place with green is small volume in the canister RRR, no murmurs, no peripheral edema Respiratory rate normal, lungs are without rales or wheezes Abdomen still somewhat tense, mild tenderness to palpation Face metric, speech fluent, moves all extremities with normal strength and coordination, oriented x 3    Data Reviewed: Basic metabolic panel shows normal electrolytes and renal function CBC shows stable leukopenia and anemia Abdomen x-ray ordered and pending   Family Communication: brOther at the  bedside    Disposition: Status is: Inpatient         Author: Lonni SHAUNNA Dalton, MD 07/24/2024 10:48 AM  For on call review [email or web address]    "

## 2024-07-25 ENCOUNTER — Inpatient Hospital Stay (HOSPITAL_COMMUNITY)

## 2024-07-25 DIAGNOSIS — K56609 Unspecified intestinal obstruction, unspecified as to partial versus complete obstruction: Secondary | ICD-10-CM | POA: Diagnosis not present

## 2024-07-25 LAB — BASIC METABOLIC PANEL WITH GFR
Anion gap: 12 (ref 5–15)
BUN: 12 mg/dL (ref 8–23)
CO2: 21 mmol/L — ABNORMAL LOW (ref 22–32)
Calcium: 8.9 mg/dL (ref 8.9–10.3)
Chloride: 105 mmol/L (ref 98–111)
Creatinine, Ser: 0.73 mg/dL (ref 0.61–1.24)
GFR, Estimated: >60 mL/min
Glucose, Bld: 109 mg/dL — ABNORMAL HIGH (ref 70–99)
Potassium: 3.9 mmol/L (ref 3.5–5.1)
Sodium: 138 mmol/L (ref 135–145)

## 2024-07-25 LAB — CBC
HCT: 31.5 % — ABNORMAL LOW (ref 39.0–52.0)
Hemoglobin: 10.1 g/dL — ABNORMAL LOW (ref 13.0–17.0)
MCH: 33.4 pg (ref 26.0–34.0)
MCHC: 32.1 g/dL (ref 30.0–36.0)
MCV: 104.3 fL — ABNORMAL HIGH (ref 80.0–100.0)
Platelets: 243 K/uL (ref 150–400)
RBC: 3.02 MIL/uL — ABNORMAL LOW (ref 4.22–5.81)
RDW: 14.6 % (ref 11.5–15.5)
WBC: 4.1 K/uL (ref 4.0–10.5)
nRBC: 0 % (ref 0.0–0.2)

## 2024-07-25 MED ORDER — LOSARTAN POTASSIUM 50 MG PO TABS
100.0000 mg | ORAL_TABLET | Freq: Every day | ORAL | Status: DC
  Administered 2024-07-25 – 2024-07-27 (×3): 100 mg via ORAL
  Filled 2024-07-25 (×3): qty 2

## 2024-07-25 MED ORDER — AMLODIPINE BESYLATE 5 MG PO TABS
5.0000 mg | ORAL_TABLET | Freq: Every day | ORAL | Status: DC
  Administered 2024-07-25 – 2024-07-27 (×3): 5 mg via ORAL
  Filled 2024-07-25 (×3): qty 1

## 2024-07-25 NOTE — Plan of Care (Signed)
 Pt still c\o of abd pain in mid lower abdomen.    Problem: Education: Goal: Knowledge of General Education information will improve Description: Including pain rating scale, medication(s)/side effects and non-pharmacologic comfort measures Outcome: Progressing   Problem: Health Behavior/Discharge Planning: Goal: Ability to manage health-related needs will improve Outcome: Progressing   Problem: Clinical Measurements: Goal: Ability to maintain clinical measurements within normal limits will improve Outcome: Progressing Goal: Will remain free from infection Outcome: Progressing Goal: Diagnostic test results will improve Outcome: Progressing Goal: Respiratory complications will improve Outcome: Progressing Goal: Cardiovascular complication will be avoided Outcome: Progressing   Problem: Coping: Goal: Level of anxiety will decrease Outcome: Progressing   Problem: Elimination: Goal: Will not experience complications related to bowel motility Outcome: Not Progressing Goal: Will not experience complications related to urinary retention Outcome: Not Progressing   Problem: Nutrition: Goal: Adequate nutrition will be maintained Outcome: Progressing   Problem: Activity: Goal: Risk for activity intolerance will decrease Outcome: Progressing   Problem: Safety: Goal: Ability to remain free from injury will improve Outcome: Progressing   Problem: Pain Managment: Goal: General experience of comfort will improve and/or be controlled Outcome: Not Progressing   Problem: Skin Integrity: Goal: Risk for impaired skin integrity will decrease Outcome: Progressing

## 2024-07-25 NOTE — Progress Notes (Signed)
 Patient had incontinence episode last night, changed self and accidentally pulled NG tube out. Patient tolerating clear liquids with out any N/V or further distention overnight. [facility] NP paged and is okay with NG tube remaining out at the moment since patient was doing a clamping trial.

## 2024-07-25 NOTE — Progress Notes (Signed)
" °  Progress Note   Patient: Travis Nguyen FMW:[id number] DOB: [birth year] DOA: 07/21/2024     4 DOS: the patient was seen and examined on 07/25/2024 at 8:53 AM      Brief hospital course: 74 y.o. M with MM on carfilzomib  and daratumumab , HTN, hx prostatectomy, hx SBO who presented with perimbilical pain and vomiting, CT confirmed SBO, NG tube placed.     Assessment and Plan: * SBO (small bowel obstruction) (HCC) Bowel movement yesterday, follow-up x-ray showed contrast in the colon.  NG clamped yesterday, tolerated p.o. well. Tube accidentally came out overnight - Advance diet as tolerated  Sinus bradycardia Resolved  Essential hypertension BP elevated - Continue hydralazine  PRN - Start labetalol  PRN - Resume home amlodipine  and losartan   Multiple myeloma (HCC)            Subjective: Patient is doing well, he has got no fever, no respiratory symptoms, no abdominal discomfort.  Had another bowel movement.     Physical Exam: BP (!) 165/101 (BP Location: Right Arm)   Pulse 82   Temp 98 F (36.7 C) (Oral)   Resp 16   Ht 6' 1 (1.854 m)   Wt 65.6 kg   SpO2 99%   BMI 19.09 kg/m   General Adult male, sitting up in bed, interactive and appropriate RRR, no murmurs, no peripheral edema Respiratory normal, lungs clear without rales or wheezes Abdomen soft, no tenderness palpation or guarding  Data Reviewed: Basic metabolic panel shows normal electrolytes and renal function CBC shows mild anemia      Family Communication:     Disposition: Status is: Inpatient         Author: Lonni SHAUNNA Dalton, MD 07/25/2024 2:27 PM  For on call review [email or web address]    "

## 2024-07-25 NOTE — Progress Notes (Signed)
 "    Subjective/Chief Complaint: No complaints other than soreness. No flatus yet   Objective: Vital signs in last 24 hours: Temp:  [98.2 F (36.8 C)-99 F (37.2 C)] 98.6 F (37 C) (01/11 0504) Pulse Rate:  [69-89] 89 (01/11 0504) Resp:  [14-16] 14 (01/11 0504) BP: (155-190)/(89-100) 155/100 (01/11 0504) SpO2:  [97 %-100 %] 100 % (01/11 0504) Last BM Date : 07/24/24  Intake/Output from previous day: 01/10 0701 - 01/11 0700 In: 1566.6 [P.O.:480; I.V.:996.6; NG/GT:90] Out: 600 [Urine:400; Emesis/NG output:200] Intake/Output this shift: No intake/output data recorded.  General appearance: alert and cooperative Resp: clear to auscultation bilaterally Cardio: regular rate and rhythm GI: soft, minimal tenderness. Not distended  Lab Results:  Recent Labs    07/24/24 0411 07/25/24 0543  WBC 3.2* 4.1  HGB 10.0* 10.1*  HCT 31.5* 31.5*  PLT 202 243   BMET Recent Labs    07/24/24 0411 07/25/24 0543  NA 139 138  K 4.3 3.9  CL 108 105  CO2 20* 21*  GLUCOSE 89 109*  BUN 14 12  CREATININE 0.67 0.73  CALCIUM 8.7* 8.9   PT/INR No results for input(s): LABPROT, INR in the last 72 hours. ABG No results for input(s): PHART, HCO3 in the last 72 hours.  Invalid input(s): PCO2, PO2  Studies/Results: DG Abd 2 Views Result Date: 07/25/2024 CLINICAL DATA:  Small bowel obstruction. EXAM: ABDOMEN - 2 VIEW COMPARISON:  07/24/2024 FINDINGS: Similar marked gaseous distention of small bowel in the upper abdomen. As before, there is some contrast visible in small bowel the pelvis and right colon up to about the level of the hepatic flexure. Upright imaging shows no intraperitoneal free air. IMPRESSION: Similar marked gaseous distention of small bowel in the upper abdomen. As before, there is some contrast visible in small bowel the pelvis and right colon up to about the level of the hepatic flexure. Electronically Signed   By: Camellia Candle M.D.   On: 07/25/2024 07:25   DG  Abd 1 View Result Date: 07/24/2024 CLINICAL DATA:  Small bowel obstruction. EXAM: ABDOMEN - 1 VIEW COMPARISON:  07/24/2024 at 0540 hours. FINDINGS: Persisting gaseous distension of small bowel with gas and stool scattered in the colon. Oral contrast is seen in the colon. Partially imaged nasogastric tube in the left upper quadrant. IMPRESSION: Persistent small bowel obstruction with some contrast in the colon. Electronically Signed   By: Newell Eke M.D.   On: 07/24/2024 14:39   DG Abd Portable 1V Result Date: 07/24/2024 CLINICAL DATA:  Small bowel obstruction. EXAM: PORTABLE ABDOMEN - 1 VIEW COMPARISON:  07/23/2024 FINDINGS: Dilated small bowel loops again noted measuring up to 5.6 cm diameter over the upper abdomen. Gas and stool again seen in the colon. NG tube is visualized with both the tip and proximal side port below the GE junction. IMPRESSION: 1. Persistent small bowel obstruction. 2. NG tube tip and proximal side port below the GE junction. Electronically Signed   By: Camellia Candle M.D.   On: 07/24/2024 07:39   DG Abd 1 View Result Date: 07/24/2024 EXAM: 1 VIEW XRAY OF THE ABDOMEN 07/24/2024 12:04:00 AM COMPARISON: 07/23/2024 CLINICAL HISTORY: Encounter for nasogastric (NG) tube placement FINDINGS: LINES, TUBES AND DEVICES: Interval advancement of an enteric tube in place with tip projecting over gastric fundus and side port likely at the gastroesophageal junction. BOWEL: Multiple gas-filled dilated loops of small bowel within central abdomen. SOFT TISSUES: Surgical clips in right upper quadrant. BONES: No acute fracture. IMPRESSION: 1.  Interval advancement of an enteric tube in place with tip projecting over gastric fundus and side port likely at the gastroesophageal junction. 2. Multiple gas-filled dilated loops of small bowel within the central abdomen, consistent with small bowel obstruction. Electronically signed by: Morgane Naveau MD MD 07/24/2024 12:35 AM EST RP Workstation: [id number]    DG Abd 1 View Result Date: 07/23/2024 CLINICAL DATA:  Nasogastric tube present. EXAM: ABDOMEN - 1 VIEW COMPARISON:  There today FINDINGS: Tip of the enteric tube is below the diaphragm, the side port is in the region of the distal esophagus. Small amount of contrast persists within the proximal stomach. Dilute contrast is seen within partially imaged small bowel as before. IMPRESSION: Tip of the enteric tube below the diaphragm, the side port is in the region of the distal esophagus. Recommend advancement of at least 3-4 cm to place the side-port below the diaphragm. Electronically Signed   By: Andrea Gasman M.D.   On: 07/23/2024 16:17   DG Abd 1 View Result Date: 07/23/2024 CLINICAL DATA:  Enteric tube placement EXAM: ABDOMEN - 1 VIEW COMPARISON:  Earlier same day abdominal radiograph at 7:46 a.m. FINDINGS: Gastric/enteric tube tip projects over the distal esophagus/gastroesophageal junction. Partially imaged right chest wall port tip terminates over the superior cavoatrial junction. Small volume enteric contrast material remains within the stomach. Dilute contrast is seen throughout the partially imaged dilated small bowel. Right upper quadrant surgical clips. IMPRESSION: 1. Gastric/enteric tube tip projects over the distal esophagus/gastroesophageal junction. Recommend advancement. 2. Small volume enteric contrast material remains within the stomach. Dilute contrast is seen throughout the partially imaged dilated small bowel. Electronically Signed   By: Limin  Xu M.D.   On: 07/23/2024 10:52    Anti-infectives: Anti-infectives (From admission, onward)    None       Assessment/Plan: s/p * No surgery found * sbo There is more contrast in right colon on today's xray. Ng came out overnight. Will leave out for now but hold on advancing diet Monitor closely  LOS: 4 days    Deward Null III 07/25/2024  "

## 2024-07-26 ENCOUNTER — Other Ambulatory Visit (HOSPITAL_COMMUNITY): Payer: Self-pay

## 2024-07-26 ENCOUNTER — Inpatient Hospital Stay (HOSPITAL_COMMUNITY)

## 2024-07-26 DIAGNOSIS — K56609 Unspecified intestinal obstruction, unspecified as to partial versus complete obstruction: Secondary | ICD-10-CM | POA: Diagnosis not present

## 2024-07-26 LAB — CBC
HCT: 28.1 % — ABNORMAL LOW (ref 39.0–52.0)
Hemoglobin: 9.1 g/dL — ABNORMAL LOW (ref 13.0–17.0)
MCH: 33.8 pg (ref 26.0–34.0)
MCHC: 32.4 g/dL (ref 30.0–36.0)
MCV: 104.5 fL — ABNORMAL HIGH (ref 80.0–100.0)
Platelets: 204 K/uL (ref 150–400)
RBC: 2.69 MIL/uL — ABNORMAL LOW (ref 4.22–5.81)
RDW: 14.4 % (ref 11.5–15.5)
WBC: 4.3 K/uL (ref 4.0–10.5)
nRBC: 0 % (ref 0.0–0.2)

## 2024-07-26 LAB — COMPREHENSIVE METABOLIC PANEL WITH GFR
ALT: 12 U/L (ref 0–44)
AST: 15 U/L (ref 15–41)
Albumin: 3.4 g/dL — ABNORMAL LOW (ref 3.5–5.0)
Alkaline Phosphatase: 58 U/L (ref 38–126)
Anion gap: 8 (ref 5–15)
BUN: 10 mg/dL (ref 8–23)
CO2: 24 mmol/L (ref 22–32)
Calcium: 8.6 mg/dL — ABNORMAL LOW (ref 8.9–10.3)
Chloride: 102 mmol/L (ref 98–111)
Creatinine, Ser: 0.66 mg/dL (ref 0.61–1.24)
GFR, Estimated: >60 mL/min
Glucose, Bld: 90 mg/dL (ref 70–99)
Potassium: 3.6 mmol/L (ref 3.5–5.1)
Sodium: 135 mmol/L (ref 135–145)
Total Bilirubin: 0.8 mg/dL (ref 0.0–1.2)
Total Protein: 6.2 g/dL — ABNORMAL LOW (ref 6.5–8.1)

## 2024-07-26 MED ORDER — OXYCODONE-ACETAMINOPHEN 10-325 MG PO TABS
1.0000 | ORAL_TABLET | Freq: Two times a day (BID) | ORAL | 0 refills | Status: DC | PRN
  Filled 2024-07-26 – 2024-07-27 (×2): qty 60, 30d supply, fill #0

## 2024-07-26 MED ORDER — ALUM & MAG HYDROXIDE-SIMETH 200-200-20 MG/5ML PO SUSP
30.0000 mL | Freq: Four times a day (QID) | ORAL | Status: DC | PRN
  Administered 2024-07-26 – 2024-07-27 (×2): 30 mL via ORAL
  Filled 2024-07-26 (×2): qty 30

## 2024-07-26 MED ORDER — OXYCODONE HCL 5 MG PO TABS
5.0000 mg | ORAL_TABLET | ORAL | Status: DC | PRN
  Administered 2024-07-26 – 2024-07-27 (×4): 5 mg via ORAL
  Filled 2024-07-26 (×4): qty 1

## 2024-07-26 NOTE — Plan of Care (Signed)
  Problem: Activity: Goal: Risk for activity intolerance will decrease Outcome: Progressing   Problem: Elimination: Goal: Will not experience complications related to bowel motility Outcome: Progressing   Problem: Pain Managment: Goal: General experience of comfort will improve and/or be controlled Outcome: Progressing   Problem: Safety: Goal: Ability to remain free from injury will improve Outcome: Progressing

## 2024-07-26 NOTE — Progress Notes (Signed)
" °  Progress Note   Patient: Travis Nguyen FMW:[id number] DOB: April 29, 1951 DOA: 07/21/2024     5 DOS: the patient was seen and examined on 07/26/2024 7:50 AM      Brief hospital course: 74 y.o. M with MM on carfilzomib  and daratumumab , HTN, hx prostatectomy, hx SBO who presented with perimbilical pain and vomiting, CT confirmed SBO, NG tube placed.     Assessment and Plan: * SBO (small bowel obstruction) (HCC) Advance to full liquids this morning, but with increasing abdominal pain after eating.  Sinus bradycardia Resolved  Essential hypertension Blood pressure normal - Continue home amlodipine  and losartan   Multiple myeloma (HCC)            Subjective: Having abdominal pain after lunch.  No fever, no respiratory distress.     Physical Exam: BP (!) 137/93   Pulse 87   Temp 98.9 F (37.2 C) (Oral)   Resp 16   Ht 6' 1 (1.854 m)   Wt 65.6 kg   SpO2 100%   BMI 19.09 kg/m   Adult male, sitting up in bed, interactive and appropriate RRR, no murmurs, no peripheral edema Respiratory rate normal, lungs clear without rales or wheezes Abdomen with more guarding, more discomfort with palpation, no distention Attention normal, affect normal, judgment and insight appear normal  Data Reviewed: General Surgery team Basic metabolic panel, LFTs, and CBC unremarkable, mild anemia     Family Communication:     Disposition: Status is: Inpatient         Author: Lonni SHAUNNA Dalton, MD 07/26/2024 4:16 PM  For on call review [email or web address].    "

## 2024-07-26 NOTE — Progress Notes (Signed)
 Pt reported he his passing flatus this morning.

## 2024-07-26 NOTE — Plan of Care (Signed)

## 2024-07-26 NOTE — Progress Notes (Signed)
 "      Subjective: Ate 2 bowls of grits and half of an orange juice.  Feeling more distended.  No nausea yet.  No BM  ROS: See above, otherwise other systems negative  Objective: Vital signs in last 24 hours: Temp:  [97.9 F (36.6 C)-98.5 F (36.9 C)] 98.5 F (36.9 C) (01/12 0620) Pulse Rate:  [63-82] 63 (01/12 0620) Resp:  [14-16] 14 (01/12 0620) BP: (157-176)/(85-101) 176/85 (01/12 0620) SpO2:  [97 %-99 %] 97 % (01/12 0620) Last BM Date : 07/24/24  Intake/Output from previous day: 01/11 0701 - 01/12 0700 In: 480 [P.O.:480] Out: 350 [Urine:350] Intake/Output this shift: Total I/O In: 10 [I.V.:10] Out: 300 [Urine:300]  PE: Gen: NAD Abd: soft, but distended, not really tender right now  Lab Results:  Recent Labs    07/25/24 0543 07/26/24 0459  WBC 4.1 4.3  HGB 10.1* 9.1*  HCT 31.5* 28.1*  PLT 243 204   BMET Recent Labs    07/25/24 0543 07/26/24 0459  NA 138 135  K 3.9 3.6  CL 105 102  CO2 21* 24  GLUCOSE 109* 90  BUN 12 10  CREATININE 0.73 0.66  CALCIUM 8.9 8.6*   PT/INR No results for input(s): LABPROT, INR in the last 72 hours. CMP     Component Value Date/Time   NA 135 07/26/2024 0459   NA 138 03/27/2017 0838   K 3.6 07/26/2024 0459   K 4.3 03/27/2017 0838   CL 102 07/26/2024 0459   CL 105 01/01/2013 0835   CO2 24 07/26/2024 0459   CO2 24 03/27/2017 0838   GLUCOSE 90 07/26/2024 0459   GLUCOSE 92 03/27/2017 0838   GLUCOSE 106 (H) 01/01/2013 0835   BUN 10 07/26/2024 0459   BUN 23.7 03/27/2017 0838   CREATININE 0.66 07/26/2024 0459   CREATININE 0.89 01/09/2024 0821   CREATININE 1.2 03/27/2017 0838   CALCIUM 8.6 (L) 07/26/2024 0459   CALCIUM 9.9 03/27/2017 0838   PROT 6.2 (L) 07/26/2024 0459   PROT 7.5 03/27/2017 0838   PROT 6.9 03/27/2017 0838   ALBUMIN 3.4 (L) 07/26/2024 0459   ALBUMIN 3.9 03/27/2017 0838   AST 15 07/26/2024 0459   AST 14 (L) 01/09/2024 0821   AST 25 03/27/2017 0838   ALT 12 07/26/2024 0459   ALT 11  01/09/2024 0821   ALT 26 03/27/2017 0838   ALKPHOS 58 07/26/2024 0459   ALKPHOS 79 03/27/2017 0838   BILITOT 0.8 07/26/2024 0459   BILITOT 0.4 01/09/2024 0821   BILITOT 0.31 03/27/2017 0838   GFRNONAA >60 07/26/2024 0459   GFRNONAA >60 01/09/2024 0821   GFRAA >60 11/16/2019 1047   Lipase     Component Value Date/Time   LIPASE 19 07/21/2024 0302       Studies/Results: DG Abd 2 Views Result Date: 07/25/2024 CLINICAL DATA:  Small bowel obstruction. EXAM: ABDOMEN - 2 VIEW COMPARISON:  07/24/2024 FINDINGS: Similar marked gaseous distention of small bowel in the upper abdomen. As before, there is some contrast visible in small bowel the pelvis and right colon up to about the level of the hepatic flexure. Upright imaging shows no intraperitoneal free air. IMPRESSION: Similar marked gaseous distention of small bowel in the upper abdomen. As before, there is some contrast visible in small bowel the pelvis and right colon up to about the level of the hepatic flexure. Electronically Signed   By: Camellia Candle M.D.   On: 07/25/2024 07:25    Anti-infectives: Anti-infectives (From admission, onward)  None        Assessment/Plan SBO -protocol does show contrast in the colon yesterday, but small bowel still very dilated and more distended on exam today. -plain film pending.  Not sure patient is fully resolving -will await this film for further recommendations -d/w primary service   FEN - FLD, but may need to decrease this VTE - SCDs ID - none currently needed  I reviewed hospitalist notes, last 24 h vitals and pain scores, last 48 h intake and output, last 24 h labs and trends, and last 24 h imaging results.   LOS: 5 days    Burnard FORBES Banter , [facility] 07/26/2024, 11:31 AM Please see Amion for pager number during day hours 7:00am-4:30pm or 7:00am -11:30am on weekends  "

## 2024-07-27 ENCOUNTER — Inpatient Hospital Stay (HOSPITAL_COMMUNITY)

## 2024-07-27 ENCOUNTER — Other Ambulatory Visit (HOSPITAL_COMMUNITY): Payer: Self-pay

## 2024-07-27 DIAGNOSIS — K56609 Unspecified intestinal obstruction, unspecified as to partial versus complete obstruction: Secondary | ICD-10-CM | POA: Diagnosis not present

## 2024-07-27 LAB — CBC
HCT: 27.7 % — ABNORMAL LOW (ref 39.0–52.0)
Hemoglobin: 8.9 g/dL — ABNORMAL LOW (ref 13.0–17.0)
MCH: 33 pg (ref 26.0–34.0)
MCHC: 32.1 g/dL (ref 30.0–36.0)
MCV: 102.6 fL — ABNORMAL HIGH (ref 80.0–100.0)
Platelets: 226 K/uL (ref 150–400)
RBC: 2.7 MIL/uL — ABNORMAL LOW (ref 4.22–5.81)
RDW: 14 % (ref 11.5–15.5)
WBC: 5 K/uL (ref 4.0–10.5)
nRBC: 0 % (ref 0.0–0.2)

## 2024-07-27 MED ORDER — SODIUM CHLORIDE 0.9 % IV SOLN: INTRAVENOUS | Status: DC

## 2024-07-27 MED ORDER — SODIUM CHLORIDE 0.9 % IV SOLN
2.0000 g | INTRAVENOUS | Status: AC
  Administered 2024-07-28: 2 g via INTRAVENOUS
  Filled 2024-07-27: qty 2

## 2024-07-27 MED ORDER — LACTATED RINGERS IV BOLUS
1000.0000 mL | Freq: Once | INTRAVENOUS | Status: AC
  Administered 2024-07-27: 1000 mL via INTRAVENOUS

## 2024-07-27 MED ORDER — POTASSIUM CHLORIDE IN NACL 20-0.9 MEQ/L-% IV SOLN
INTRAVENOUS | Status: AC
  Filled 2024-07-27 (×4): qty 1000

## 2024-07-27 MED ORDER — MAGIC MOUTHWASH: 15.0000 mL | Freq: Four times a day (QID) | ORAL | Status: DC | PRN

## 2024-07-27 MED ORDER — HYDROMORPHONE HCL 1 MG/ML IJ SOLN
1.0000 mg | INTRAMUSCULAR | Status: DC | PRN
  Administered 2024-07-27 – 2024-07-28 (×7): 1 mg via INTRAVENOUS
  Filled 2024-07-27 (×8): qty 1

## 2024-07-27 MED ORDER — SIMETHICONE 40 MG/0.6ML PO SUSP
80.0000 mg | Freq: Four times a day (QID) | ORAL | Status: DC | PRN
  Administered 2024-07-30 – 2024-08-04 (×2): 80 mg via ORAL
  Filled 2024-07-27 (×2): qty 1.2

## 2024-07-27 MED ORDER — PHENOL 1.4 % MT LIQD: 2.0000 | OROMUCOSAL | Status: DC | PRN

## 2024-07-27 MED ORDER — LACTATED RINGERS IV BOLUS: 1000.0000 mL | Freq: Three times a day (TID) | INTRAVENOUS | Status: AC | PRN

## 2024-07-27 MED ORDER — MENTHOL 3 MG MT LOZG: 1.0000 | LOZENGE | OROMUCOSAL | Status: DC | PRN

## 2024-07-27 NOTE — Plan of Care (Signed)
   Problem: Education: Goal: Knowledge of General Education information will improve Description: Including pain rating scale, medication(s)/side effects and non-pharmacologic comfort measures Outcome: Progressing   Problem: Activity: Goal: Risk for activity intolerance will decrease Outcome: Progressing   Problem: Nutrition: Goal: Adequate nutrition will be maintained Outcome: Progressing   Problem: Coping: Goal: Level of anxiety will decrease Outcome: Progressing

## 2024-07-27 NOTE — Progress Notes (Signed)
" ° ° °  PROCEDURAL EXPEDITER PROGRESS NOTE  Patient Name: Travis Nguyen  DOB:[birth year] Date of Admission: 07/21/2024  Date of Assessment:07/27/2024   -------------------------------------------------------------------------------------------------------------------   Brief clinical summary:  74 yr old male in pt having surgery on 07/28/24, Laparoscopy, Diagnostic  Orders in place:  Yes   Communication with surgical team if no orders: n/a  Labs, test, and orders reviewed: Y  Requires surgical clearance:  No  What type of clearance: n/a  Clearance received: n/a  Barriers noted:n/a   Intervention provided by [facility] team: n/a  Barrier resolved:  not applicable   -------------------------------------------------------------------------------------------------------------------  [employer], Ronal DELENA Bald Please contact us  directly via secure chat (search for [facility]) or by calling us  at [phone] [facility]).  "

## 2024-07-27 NOTE — Progress Notes (Signed)
 "      Subjective: Back on CLD and drank some this am and then immediately got distended.  No BM, just some flatus.  Abdominal pain worse when he gets distended.  ROS: See above, otherwise other systems negative  Objective: Vital signs in last 24 hours: Temp:  [98.5 F (36.9 C)-98.9 F (37.2 C)] 98.8 F (37.1 C) (01/13 0421) Pulse Rate:  [61-87] 61 (01/13 0421) Resp:  [16-18] 18 (01/13 0421) BP: (137-140)/(80-93) 140/80 (01/13 0421) SpO2:  [100 %] 100 % (01/13 0421) Last BM Date : 07/24/24  Intake/Output from previous day: 01/12 0701 - 01/13 0700 In: 257 [P.O.:237; I.V.:20] Out: 800 [Urine:800] Intake/Output this shift: Total I/O In: 838 [P.O.:838] Out: 400 [Urine:400]  PE: Gen: NAD Abd: distended, mildly tender right now  Lab Results:  Recent Labs    07/25/24 0543 07/26/24 0459  WBC 4.1 4.3  HGB 10.1* 9.1*  HCT 31.5* 28.1*  PLT 243 204   BMET Recent Labs    07/25/24 0543 07/26/24 0459  NA 138 135  K 3.9 3.6  CL 105 102  CO2 21* 24  GLUCOSE 109* 90  BUN 12 10  CREATININE 0.73 0.66  CALCIUM 8.9 8.6*   PT/INR No results for input(s): LABPROT, INR in the last 72 hours. CMP     Component Value Date/Time   NA 135 07/26/2024 0459   NA 138 03/27/2017 0838   K 3.6 07/26/2024 0459   K 4.3 03/27/2017 0838   CL 102 07/26/2024 0459   CL 105 01/01/2013 0835   CO2 24 07/26/2024 0459   CO2 24 03/27/2017 0838   GLUCOSE 90 07/26/2024 0459   GLUCOSE 92 03/27/2017 0838   GLUCOSE 106 (H) 01/01/2013 0835   BUN 10 07/26/2024 0459   BUN 23.7 03/27/2017 0838   CREATININE 0.66 07/26/2024 0459   CREATININE 0.89 01/09/2024 0821   CREATININE 1.2 03/27/2017 0838   CALCIUM 8.6 (L) 07/26/2024 0459   CALCIUM 9.9 03/27/2017 0838   PROT 6.2 (L) 07/26/2024 0459   PROT 7.5 03/27/2017 0838   PROT 6.9 03/27/2017 0838   ALBUMIN 3.4 (L) 07/26/2024 0459   ALBUMIN 3.9 03/27/2017 0838   AST 15 07/26/2024 0459   AST 14 (L) 01/09/2024 0821   AST 25 03/27/2017 0838   ALT  12 07/26/2024 0459   ALT 11 01/09/2024 0821   ALT 26 03/27/2017 0838   ALKPHOS 58 07/26/2024 0459   ALKPHOS 79 03/27/2017 0838   BILITOT 0.8 07/26/2024 0459   BILITOT 0.4 01/09/2024 0821   BILITOT 0.31 03/27/2017 0838   GFRNONAA >60 07/26/2024 0459   GFRNONAA >60 01/09/2024 0821   GFRAA >60 11/16/2019 1047   Lipase     Component Value Date/Time   LIPASE 19 07/21/2024 0302       Studies/Results: DG Abd Portable 1V Result Date: 07/26/2024 EXAM: 1 VIEW XRAY OF THE ABDOMEN 07/26/2024 11:32:00 AM COMPARISON: 07/25/2024 CLINICAL HISTORY: SBO (small bowel obstruction) (HCC) FINDINGS: BOWEL: Previously seen contrast material lies within the right colon. Persistent small bowel dilatation is seen but slightly improved when compared with the prior exam. No free air is noted. SOFT TISSUES: No abnormal calcifications. BONES: No acute fracture. IMPRESSION: 1. Persistent small bowel dilatation, slightly improved compared to the prior exam, with contrast material within the right colon. 2. No free air. Electronically signed by: Oneil Devonshire MD MD 07/26/2024 11:36 PM EST RP Workstation: [id number]    Anti-infectives: Anti-infectives (From admission, onward)    None  Assessment/Plan SBO -protocol does show contrast in the colon yesterday, but small bowel still very dilated and distended on exam even with just clear liquids -d/w patient that I'm concerned he may end up needing and operation as I suspect he has a high grade partial obstruction not allowing him to completed resolve. -will plan to replace NGT today and likely OR tomorrow -d/w primary service and RN  FEN - NPO, NGT VTE - SCDs ID - none currently needed  I reviewed hospitalist notes, last 24 h vitals and pain scores, last 48 h intake and output, last 24 h labs and trends, and last 24 h imaging results.   LOS: 6 days    Burnard FORBES Banter , [facility] 07/27/2024, 10:53 AM Please see Amion for pager  number during day hours 7:00am-4:30pm or 7:00am -11:30am on weekends  "

## 2024-07-27 NOTE — H&P (View-Only) (Signed)
 "      Subjective: Back on CLD and drank some this am and then immediately got distended.  No BM, just some flatus.  Abdominal pain worse when he gets distended.  ROS: See above, otherwise other systems negative  Objective: Vital signs in last 24 hours: Temp:  [98.5 F (36.9 C)-98.9 F (37.2 C)] 98.8 F (37.1 C) (01/13 0421) Pulse Rate:  [61-87] 61 (01/13 0421) Resp:  [16-18] 18 (01/13 0421) BP: (137-140)/(80-93) 140/80 (01/13 0421) SpO2:  [100 %] 100 % (01/13 0421) Last BM Date : 07/24/24  Intake/Output from previous day: 01/12 0701 - 01/13 0700 In: 257 [P.O.:237; I.V.:20] Out: 800 [Urine:800] Intake/Output this shift: Total I/O In: 838 [P.O.:838] Out: 400 [Urine:400]  PE: Gen: NAD Abd: distended, mildly tender right now  Lab Results:  Recent Labs    07/25/24 0543 07/26/24 0459  WBC 4.1 4.3  HGB 10.1* 9.1*  HCT 31.5* 28.1*  PLT 243 204   BMET Recent Labs    07/25/24 0543 07/26/24 0459  NA 138 135  K 3.9 3.6  CL 105 102  CO2 21* 24  GLUCOSE 109* 90  BUN 12 10  CREATININE 0.73 0.66  CALCIUM 8.9 8.6*   PT/INR No results for input(s): LABPROT, INR in the last 72 hours. CMP     Component Value Date/Time   NA 135 07/26/2024 0459   NA 138 03/27/2017 0838   K 3.6 07/26/2024 0459   K 4.3 03/27/2017 0838   CL 102 07/26/2024 0459   CL 105 01/01/2013 0835   CO2 24 07/26/2024 0459   CO2 24 03/27/2017 0838   GLUCOSE 90 07/26/2024 0459   GLUCOSE 92 03/27/2017 0838   GLUCOSE 106 (H) 01/01/2013 0835   BUN 10 07/26/2024 0459   BUN 23.7 03/27/2017 0838   CREATININE 0.66 07/26/2024 0459   CREATININE 0.89 01/09/2024 0821   CREATININE 1.2 03/27/2017 0838   CALCIUM 8.6 (L) 07/26/2024 0459   CALCIUM 9.9 03/27/2017 0838   PROT 6.2 (L) 07/26/2024 0459   PROT 7.5 03/27/2017 0838   PROT 6.9 03/27/2017 0838   ALBUMIN 3.4 (L) 07/26/2024 0459   ALBUMIN 3.9 03/27/2017 0838   AST 15 07/26/2024 0459   AST 14 (L) 01/09/2024 0821   AST 25 03/27/2017 0838   ALT  12 07/26/2024 0459   ALT 11 01/09/2024 0821   ALT 26 03/27/2017 0838   ALKPHOS 58 07/26/2024 0459   ALKPHOS 79 03/27/2017 0838   BILITOT 0.8 07/26/2024 0459   BILITOT 0.4 01/09/2024 0821   BILITOT 0.31 03/27/2017 0838   GFRNONAA >60 07/26/2024 0459   GFRNONAA >60 01/09/2024 0821   GFRAA >60 11/16/2019 1047   Lipase     Component Value Date/Time   LIPASE 19 07/21/2024 0302       Studies/Results: DG Abd Portable 1V Result Date: 07/26/2024 EXAM: 1 VIEW XRAY OF THE ABDOMEN 07/26/2024 11:32:00 AM COMPARISON: 07/25/2024 CLINICAL HISTORY: SBO (small bowel obstruction) (HCC) FINDINGS: BOWEL: Previously seen contrast material lies within the right colon. Persistent small bowel dilatation is seen but slightly improved when compared with the prior exam. No free air is noted. SOFT TISSUES: No abnormal calcifications. BONES: No acute fracture. IMPRESSION: 1. Persistent small bowel dilatation, slightly improved compared to the prior exam, with contrast material within the right colon. 2. No free air. Electronically signed by: Oneil Devonshire MD MD 07/26/2024 11:36 PM EST RP Workstation: [id number]    Anti-infectives: Anti-infectives (From admission, onward)    None  Assessment/Plan SBO -protocol does show contrast in the colon yesterday, but small bowel still very dilated and distended on exam even with just clear liquids -d/w patient that I'm concerned he may end up needing and operation as I suspect he has a high grade partial obstruction not allowing him to completed resolve. -will plan to replace NGT today and likely OR tomorrow -d/w primary service and RN  FEN - NPO, NGT VTE - SCDs ID - none currently needed  I reviewed hospitalist notes, last 24 h vitals and pain scores, last 48 h intake and output, last 24 h labs and trends, and last 24 h imaging results.   LOS: 6 days    Burnard FORBES Banter , [facility] 07/27/2024, 10:53 AM Please see Amion for pager  number during day hours 7:00am-4:30pm or 7:00am -11:30am on weekends  "

## 2024-07-27 NOTE — Progress Notes (Signed)
" °  Progress Note   Patient: Travis Nguyen FMW:[id number] DOB: 05/17/1951 DOA: 07/21/2024     6 DOS: the patient was seen and examined on 07/27/2024 at 11:17AM      Brief hospital course: 74 y.o. M with MM on carfilzomib  and daratumumab , HTN, hx prostatectomy, hx SBO who presented with perimbilical pain and vomiting, CT confirmed SBO, NG tube placed.     Assessment and Plan: * SBO (small bowel obstruction) (HCC) Admitted and NG placed.  He accidentally removed NG tube twice in the first two days, and then started to have occasional flatus and small BM and contrast from SB protocol passed to colon.  However, in the last 48 hours, repeat attempts to advance diet limited by pain and distension.  Today Gen Surg recommending replace NG and LOA tomorrow - IV fluids - NG to LIS - To the OR tomorrow    Sinus bradycardia Stable  Essential hypertension BP elevated - Continue hydralazine  PRN and labetalol  PRN - Start labetalol  PRN  Multiple myeloma (HCC) Normocytic anemia due to malignancy Hgb stable         Subjective: NG back in today. No confusion, no fever     Physical Exam: BP (!) 168/80 (BP Location: Right Arm)   Pulse 63   Temp 98.6 F (37 C) (Oral)   Resp 16   Ht 6' 1 (1.854 m)   Wt 65.6 kg   SpO2 99%   BMI 19.09 kg/m   General: Pt is alert, awake, not in acute distress, walking in hall Cardiovascular: RRR, nl S1-S2, no murmurs appreciated.   No LE edema.   Respiratory: Normal respiratory rate and rhythm.  CTAB without rales or wheezes. Abdominal: Abdomen with guarding, distension. Mild tenderness througout.    Neuro/Psych: Strength symmetric in upper and lower extremities.  Judgment and insight appear normal.   Data Reviewed: Discussed with Gen Surg CBC shows stable anemia       Family Communication:     Disposition: Status is: Inpatient 74 y.o. M with MM amditted with SBO  Failed conservative mgmt, going to the OR  tomorrow        Author: Lonni SHAUNNA Dalton, MD 07/27/2024 4:36 PM  For on call review [email or web address].    "

## 2024-07-27 NOTE — Progress Notes (Signed)
 Patient able to tolerate 2 cups of broth, 2 juices and 1 Popsicle this morning, was given prn zofran  before po intake

## 2024-07-28 ENCOUNTER — Inpatient Hospital Stay (HOSPITAL_COMMUNITY): Admitting: Certified Registered"

## 2024-07-28 ENCOUNTER — Inpatient Hospital Stay (HOSPITAL_COMMUNITY)

## 2024-07-28 ENCOUNTER — Encounter (HOSPITAL_COMMUNITY): Payer: Self-pay | Admitting: *Deleted

## 2024-07-28 ENCOUNTER — Encounter (HOSPITAL_COMMUNITY)
Admission: EM | Disposition: A | Discharge status: Home / Self Care | Payer: Self-pay | Source: Home / Self Care | Attending: Internal Medicine

## 2024-07-28 DIAGNOSIS — K565 Intestinal adhesions [bands], unspecified as to partial versus complete obstruction: Secondary | ICD-10-CM | POA: Diagnosis not present

## 2024-07-28 DIAGNOSIS — K56609 Unspecified intestinal obstruction, unspecified as to partial versus complete obstruction: Secondary | ICD-10-CM | POA: Diagnosis not present

## 2024-07-28 HISTORY — PX: APPENDECTOMY: SHX54

## 2024-07-28 HISTORY — PX: LAPAROSCOPY: SHX197

## 2024-07-28 LAB — COMPREHENSIVE METABOLIC PANEL WITH GFR
ALT: 9 U/L (ref 0–44)
AST: 17 U/L (ref 15–41)
Albumin: 3.1 g/dL — ABNORMAL LOW (ref 3.5–5.0)
Alkaline Phosphatase: 56 U/L (ref 38–126)
Anion gap: 7 (ref 5–15)
BUN: 6 mg/dL — ABNORMAL LOW (ref 8–23)
CO2: 24 mmol/L (ref 22–32)
Calcium: 8.1 mg/dL — ABNORMAL LOW (ref 8.9–10.3)
Chloride: 104 mmol/L (ref 98–111)
Creatinine, Ser: 0.6 mg/dL — ABNORMAL LOW (ref 0.61–1.24)
GFR, Estimated: >60 mL/min
Glucose, Bld: 70 mg/dL (ref 70–99)
Potassium: 4.2 mmol/L (ref 3.5–5.1)
Sodium: 135 mmol/L (ref 135–145)
Total Bilirubin: 0.5 mg/dL (ref 0.0–1.2)
Total Protein: 5.7 g/dL — ABNORMAL LOW (ref 6.5–8.1)

## 2024-07-28 LAB — CBC
HCT: 26.8 % — ABNORMAL LOW (ref 39.0–52.0)
Hemoglobin: 8.4 g/dL — ABNORMAL LOW (ref 13.0–17.0)
MCH: 33.3 pg (ref 26.0–34.0)
MCHC: 31.3 g/dL (ref 30.0–36.0)
MCV: 106.3 fL — ABNORMAL HIGH (ref 80.0–100.0)
Platelets: 212 K/uL (ref 150–400)
RBC: 2.52 MIL/uL — ABNORMAL LOW (ref 4.22–5.81)
RDW: 14 % (ref 11.5–15.5)
WBC: 4.9 K/uL (ref 4.0–10.5)
nRBC: 0 % (ref 0.0–0.2)

## 2024-07-28 MED ORDER — DROPERIDOL 2.5 MG/ML IJ SOLN: 0.6250 mg | Freq: Once | INTRAMUSCULAR | Status: DC | PRN

## 2024-07-28 MED ORDER — BUPIVACAINE-EPINEPHRINE (PF) 0.25% -1:200000 IJ SOLN
INTRAMUSCULAR | Status: AC
  Filled 2024-07-28: qty 60

## 2024-07-28 MED ORDER — FENTANYL CITRATE (PF) 250 MCG/5ML IJ SOLN
INTRAMUSCULAR | Status: AC
  Filled 2024-07-28: qty 5

## 2024-07-28 MED ORDER — MEPERIDINE HCL 25 MG/ML IJ SOLN: 6.2500 mg | INTRAMUSCULAR | Status: DC | PRN

## 2024-07-28 MED ORDER — ONDANSETRON HCL 4 MG/2ML IJ SOLN
INTRAMUSCULAR | Status: DC | PRN
  Administered 2024-07-28: 4 mg via INTRAVENOUS

## 2024-07-28 MED ORDER — PROPOFOL 10 MG/ML IV BOLUS
INTRAVENOUS | Status: DC | PRN
  Administered 2024-07-28: 110 mg via INTRAVENOUS

## 2024-07-28 MED ORDER — FENTANYL CITRATE (PF) 100 MCG/2ML IJ SOLN
INTRAMUSCULAR | Status: DC | PRN
  Administered 2024-07-28: 25 ug via INTRAVENOUS
  Administered 2024-07-28: 100 ug via INTRAVENOUS

## 2024-07-28 MED ORDER — LACTATED RINGERS IV SOLN: INTRAVENOUS | Status: DC

## 2024-07-28 MED ORDER — ONDANSETRON HCL 4 MG/2ML IJ SOLN
INTRAMUSCULAR | Status: AC
  Filled 2024-07-28: qty 2

## 2024-07-28 MED ORDER — SUCCINYLCHOLINE CHLORIDE 200 MG/10ML IV SOSY
PREFILLED_SYRINGE | INTRAVENOUS | Status: AC
  Filled 2024-07-28: qty 10

## 2024-07-28 MED ORDER — HYDROMORPHONE HCL 1 MG/ML IJ SOLN
0.2500 mg | INTRAMUSCULAR | Status: DC | PRN
  Administered 2024-07-28 (×2): 0.5 mg via INTRAVENOUS

## 2024-07-28 MED ORDER — ROCURONIUM BROMIDE 10 MG/ML (PF) SYRINGE
PREFILLED_SYRINGE | INTRAVENOUS | Status: AC
  Filled 2024-07-28: qty 30

## 2024-07-28 MED ORDER — OXYCODONE HCL 5 MG PO TABS: 5.0000 mg | ORAL_TABLET | Freq: Once | ORAL | Status: DC | PRN

## 2024-07-28 MED ORDER — BUPIVACAINE LIPOSOME 1.3 % IJ SUSP
INTRAMUSCULAR | Status: AC
  Filled 2024-07-28: qty 20

## 2024-07-28 MED ORDER — ALTEPLASE 2 MG IJ SOLR
2.0000 mg | Freq: Once | INTRAMUSCULAR | Status: AC
  Administered 2024-07-28: 2 mg
  Filled 2024-07-28: qty 2

## 2024-07-28 MED ORDER — HYDROMORPHONE HCL 1 MG/ML IJ SOLN
0.5000 mg | INTRAMUSCULAR | Status: DC | PRN
  Administered 2024-07-28 – 2024-07-29 (×6): 1 mg via INTRAVENOUS
  Filled 2024-07-28 (×6): qty 1

## 2024-07-28 MED ORDER — DEXAMETHASONE SOD PHOSPHATE PF 10 MG/ML IJ SOLN
INTRAMUSCULAR | Status: AC
  Filled 2024-07-28: qty 1

## 2024-07-28 MED ORDER — CARMEX CLASSIC LIP BALM EX OINT
TOPICAL_OINTMENT | CUTANEOUS | Status: DC | PRN
  Filled 2024-07-28: qty 10

## 2024-07-28 MED ORDER — METHOCARBAMOL 1000 MG/10ML IJ SOLN
500.0000 mg | Freq: Four times a day (QID) | INTRAMUSCULAR | Status: DC | PRN
  Administered 2024-07-28: 500 mg via INTRAVENOUS
  Filled 2024-07-28: qty 10

## 2024-07-28 MED ORDER — HYDROMORPHONE HCL 1 MG/ML IJ SOLN
INTRAMUSCULAR | Status: AC
  Filled 2024-07-28: qty 1

## 2024-07-28 MED ORDER — SUCCINYLCHOLINE CHLORIDE 200 MG/10ML IV SOSY
PREFILLED_SYRINGE | INTRAVENOUS | Status: AC
  Filled 2024-07-28: qty 20

## 2024-07-28 MED ORDER — OXYCODONE HCL 5 MG/5ML PO SOLN: 5.0000 mg | Freq: Once | ORAL | Status: DC | PRN

## 2024-07-28 MED ORDER — ACETAMINOPHEN 10 MG/ML IV SOLN
INTRAVENOUS | Status: AC
  Filled 2024-07-28: qty 100

## 2024-07-28 MED ORDER — BUPIVACAINE-EPINEPHRINE 0.25% -1:200000 IJ SOLN
INTRAMUSCULAR | Status: DC | PRN
  Administered 2024-07-28: 60 mL

## 2024-07-28 MED ORDER — ACETAMINOPHEN 10 MG/ML IV SOLN
1000.0000 mg | Freq: Four times a day (QID) | INTRAVENOUS | Status: AC
  Administered 2024-07-28 – 2024-07-29 (×4): 1000 mg via INTRAVENOUS
  Filled 2024-07-28 (×6): qty 100

## 2024-07-28 MED ORDER — ACETAMINOPHEN 10 MG/ML IV SOLN
INTRAVENOUS | Status: DC | PRN
  Administered 2024-07-28: 1000 mg via INTRAVENOUS

## 2024-07-28 MED ORDER — ACETAMINOPHEN 10 MG/ML IV SOLN: 1000.0000 mg | Freq: Once | INTRAVENOUS | Status: DC | PRN

## 2024-07-28 MED ORDER — SUGAMMADEX SODIUM 200 MG/2ML IV SOLN
INTRAVENOUS | Status: DC | PRN
  Administered 2024-07-28: 200 mg via INTRAVENOUS

## 2024-07-28 MED ORDER — ROCURONIUM BROMIDE 100 MG/10ML IV SOLN
INTRAVENOUS | Status: DC | PRN
  Administered 2024-07-28: 20 mg via INTRAVENOUS
  Administered 2024-07-28: 50 mg via INTRAVENOUS

## 2024-07-28 MED ORDER — LIDOCAINE HCL (PF) 2 % IJ SOLN
INTRAMUSCULAR | Status: AC
  Filled 2024-07-28: qty 15

## 2024-07-28 MED ORDER — MIDAZOLAM HCL 2 MG/2ML IJ SOLN
INTRAMUSCULAR | Status: AC
  Filled 2024-07-28: qty 2

## 2024-07-28 MED ORDER — LACTATED RINGERS IV SOLN: INTRAVENOUS | Status: DC | PRN

## 2024-07-28 MED ORDER — SUCCINYLCHOLINE CHLORIDE 200 MG/10ML IV SOSY
PREFILLED_SYRINGE | INTRAVENOUS | Status: DC | PRN
  Administered 2024-07-28: 120 mg via INTRAVENOUS

## 2024-07-28 MED ORDER — MIDAZOLAM HCL 5 MG/5ML IJ SOLN
INTRAMUSCULAR | Status: DC | PRN
  Administered 2024-07-28: 2 mg via INTRAVENOUS

## 2024-07-28 MED ORDER — 0.9 % SODIUM CHLORIDE (POUR BTL) OPTIME
TOPICAL | Status: DC | PRN
  Administered 2024-07-28: 2000 mL

## 2024-07-28 MED ORDER — LIDOCAINE HCL (CARDIAC) PF 100 MG/5ML IV SOSY
PREFILLED_SYRINGE | INTRAVENOUS | Status: DC | PRN
  Administered 2024-07-28: 40 mg via INTRAVENOUS

## 2024-07-28 NOTE — Progress Notes (Signed)
 Transport picked patient up to take to 3E. All belongings in room sent with patient.

## 2024-07-28 NOTE — Plan of Care (Signed)

## 2024-07-28 NOTE — Anesthesia Procedure Notes (Signed)
 Procedure Name: Intubation Date/Time: 07/28/2024 11:56 AM  Performed by: Memory Armida LABOR, CRNAPre-anesthesia Checklist: Patient identified, Emergency Drugs available, Suction available, Patient being monitored and Timeout performed Patient Re-evaluated:Patient Re-evaluated prior to induction Oxygen Delivery Method: Circle system utilized Preoxygenation: Pre-oxygenation with 100% oxygen Induction Type: IV induction, Rapid sequence and Cricoid Pressure applied Laryngoscope Size: Mac and 4 Grade View: Grade I Tube type: Oral Tube size: 7.5 mm Number of attempts: 1 Airway Equipment and Method: Stylet Placement Confirmation: ETT inserted through vocal cords under direct vision, positive ETCO2 and breath sounds checked- equal and bilateral Secured at: 22 cm Tube secured with: Tape Dental Injury: Teeth and Oropharynx as per pre-operative assessment

## 2024-07-28 NOTE — Discharge Instructions (Signed)
 SURGERY: POST OP INSTRUCTIONS (Surgery for small bowel obstruction, colon resection, etc)   ######################################################################  EAT Gradually transition to a high fiber diet with a fiber supplement over the next few days after discharge  WALK Walk an hour a day.  Control your pain to do that.    CONTROL PAIN Control pain so that you can walk, sleep, tolerate sneezing/coughing, go up/down stairs.  HAVE A BOWEL MOVEMENT DAILY Keep your bowels regular to avoid problems.  OK to try a laxative to override constipation.  OK to use an antidairrheal to slow down diarrhea.  Call if not better after 2 tries  CALL IF YOU HAVE PROBLEMS/CONCERNS Call if you are still struggling despite following these instructions. Call if you have concerns not answered by these instructions  ######################################################################   DIET Follow a light diet the first few days at home.  Start with a bland diet such as soups, liquids, starchy foods, low fat foods, etc.  If you feel full, bloated, or constipated, stay on a ful liquid or pureed/blenderized diet for a few days until you feel better and no longer constipated. Be sure to drink plenty of fluids every day to avoid getting dehydrated (feeling dizzy, not urinating, etc.). Gradually add a fiber supplement to your diet over the next week.  Gradually get back to a regular solid diet.  Avoid fast food or heavy meals the first week as you are more likely to get nauseated. It is expected for your digestive tract to need a few months to get back to normal.  It is common for your bowel movements and stools to be irregular.  You will have occasional bloating and cramping that should eventually fade away.  Until you are eating solid food normally, off all pain medications, and back to regular activities; your bowels will not be normal. Focus on eating a low-fat, high fiber diet the rest of your life  (See Getting to Good Bowel Health, below).  CARE of your INCISION or WOUND  It is good for closed incisions and even open wounds to be washed every day.  Shower every day.  Short baths are fine.  Wash the incisions and wounds clean with soap & water.    You may leave closed incisions open to air if it is dry.   You may cover the incision with clean gauze & replace it after your daily shower for comfort.  TEGADERM:  You have clear gauze band-aid dressings over your closed incision(s).  Remove the dressings 2 days after surgery = 1/16 Thursday.    If you have an open wound with a wound vac, see wound vac care instructions.    ACTIVITIES as tolerated Start light daily activities --- self-care, walking, climbing stairs-- beginning the day after surgery.  Gradually increase activities as tolerated.  Control your pain to be active.  Stop when you are tired.  Ideally, walk several times a day, eventually an hour a day.   Most people are back to most day-to-day activities in a few weeks.  It takes 4-8 weeks to get back to unrestricted, intense activity. If you can walk 30 minutes without difficulty, it is safe to try more intense activity such as jogging, treadmill, bicycling, low-impact aerobics, swimming, etc. Save the most intensive and strenuous activity for last (Usually 4-8 weeks after surgery) such as sit-ups, heavy lifting, contact sports, etc.  Refrain from any intense heavy lifting or straining until you are off narcotics for pain control.  You will have  off days, but things should improve week-by-week. DO NOT PUSH THROUGH PAIN.  Let pain be your guide: If it hurts to do something, don't do it.  Pain is your body warning you to avoid that activity for another week until the pain goes down. You may drive when you are no longer taking narcotic prescription pain medication, you can comfortably wear a seatbelt, and you can safely make sudden turns/stops to protect yourself without hesitating due  to pain. You may have sexual intercourse when it is comfortable. If it hurts to do something, stop.   MEDICATIONS Take your usually prescribed home medications unless otherwise directed.    Blood thinners:  You can restart any strong blood thinners after the second postoperative day  for example: COUMADIN (warfarin), XERELTO (rivaroxaban ), ELIQUIS (apixaban), PLAVIX (clopidigrel), BRILINTA (ticagrelor), EFFIENT (prasugrel), PRADAXA (dabigatran), etc  Continue aspirin  before & after surgery..     Some oozing/bleeding the first 1-2 weeks is common but should taper down & be small volume.    If you are passing many large clots or having uncontrolling bleeding, call your surgeon    PAIN CONTROL Pain after surgery or related to activity is often due to strain/injury to muscle, tendon, nerves and/or incisions.  This pain is usually short-term and will improve in a few months.  To help speed the process of healing and to get back to regular activity more quickly, DO THE FOLLOWING THINGS TOGETHER: Increase activity gradually.  DO NOT PUSH THROUGH PAIN Use Ice and/or Heat Try Gentle Massage and/or Stretching Take over the counter pain medication Take Narcotic prescription pain medication for more severe pain  Good pain control = faster recovery.  It is better to take more medicine to be more active than to stay in bed all day to avoid medications.  Increase activity gradually Avoid heavy lifting at first, then increase to lifting as tolerated over the next 6 weeks. Do not push through the pain.  Listen to your body and avoid positions and maneuvers than reproduce the pain.  Wait a few days before trying something more intense Walking an hour a day is encouraged to help your body recover faster and more safely.  Start slowly and stop when getting sore.  If you can walk 30 minutes without stopping or pain, you can try more intense activity (running, jogging, aerobics, cycling, swimming,  treadmill, sex, sports, weightlifting, etc.) Remember: If it hurts to do it, then dont do it! Use Ice and/or Heat You will have swelling and bruising around the incisions.  This will take several weeks to resolve. Ice packs or heating pads (6-8 times a day, 30-60 minutes at a time) will help sooth soreness & bruising. Some people prefer to use ice alone, heat alone, or alternate between ice & heat.  Experiment and see what works best for you.  Consider trying ice for the first few days to help decrease swelling and bruising; then, switch to heat to help relax sore spots and speed recovery. Shower every day.  Short baths are fine.  It feels good!  Keep the incisions and wounds clean with soap & water.   Try Gentle Massage and/or Stretching Massage at the area of pain many times a day Stop if you feel pain - do not overdo it Take over the counter pain medication This helps the muscle and nerve tissues become less irritable and calm down faster Choose ONE of the following over-the-counter anti-inflammatory medications: Acetaminophen  500mg  tabs (Tylenol ) 1-2 pills with every  meal and just before bedtime (avoid if you have liver problems or if you have acetaminophen  in you narcotic prescription) Naproxen 220mg  tabs (ex. Aleve, Naprosyn) 1-2 pills twice a day (avoid if you have kidney, stomach, IBD, or bleeding problems) Ibuprofen 200mg  tabs (ex. Advil, Motrin) 3-4 pills with every meal and just before bedtime (avoid if you have kidney, stomach, IBD, or bleeding problems) Take with food/snack several times a day as directed for at least 2 weeks to help keep pain / soreness down & more manageable. Take Narcotic prescription pain medication for more severe pain A prescription for strong pain control is often given to you upon discharge (for example: oxycodone /Percocet, hydrocodone /Norco/Vicodin, or tramadol /Ultram ) Take your pain medication as prescribed. Be mindful that most narcotic prescriptions  contain Tylenol  (acetaminophen ) as well - avoid taking too much Tylenol . If you are having problems/concerns with the prescription medicine (does not control pain, nausea, vomiting, rash, itching, etc.), please call us  [phone] to see if we need to switch you to a different pain medicine that will work better for you and/or control your side effects better. If you need a refill on your pain medication, you must call the office before 4 pm and on weekdays only.  By federal law, prescriptions for narcotics cannot be called into a pharmacy.  They must be filled out on paper & picked up from our office by the patient or authorized caretaker.  Prescriptions cannot be filled after 4 pm nor on weekends.    WHEN TO CALL US  [phone] Severe uncontrolled or worsening pain  Fever over 101 F (38.5 C) Concerns with the incision: Worsening pain, redness, rash/hives, swelling, bleeding, or drainage Reactions / problems with new medications (itching, rash, hives, nausea, etc.) Nausea and/or vomiting Difficulty urinating Difficulty breathing Worsening fatigue, dizziness, lightheadedness, blurred vision Other concerns If you are not getting better after two weeks or are noticing you are getting worse, contact our office [phone] for further advice.  We may need to adjust your medications, re-evaluate you in the office, send you to the emergency room, or see what other things we can do to help. The clinic staff is available to answer your questions during regular business hours (8:30am-5pm).  Please dont hesitate to call and ask to speak to one of our nurses for clinical concerns.    A surgeon from [facility] is always on call at the hospitals 24 hours/day If you have a medical emergency, go to the nearest emergency room or call 911.  FOLLOW UP in our office One the day of your discharge from the hospital (or the next business weekday), please call [facility] to set up  or confirm an appointment to see your surgeon in the office for a follow-up appointment.  Usually it is 2-3 weeks after your surgery.   If you have skin staples at your incision(s), let the office know so we can set up a time in the office for the nurse to remove them (usually around 10 days after surgery). Make sure that you call for appointments the day of discharge (or the next business weekday) from the hospital to ensure a convenient appointment time. IF YOU HAVE DISABILITY OR FAMILY LEAVE FORMS, BRING THEM TO THE OFFICE FOR PROCESSING.  DO NOT GIVE THEM TO YOUR DOCTOR.  [facility] [address], [place], [place]  [address] ? [phone] - Main [phone] - Toll Free,  [phone] - Fax [email or web address]  GETTING TO GOOD BOWEL HEALTH. It is expected for your digestive tract to need a few months to get back to normal.  It is common for your bowel movements and stools to be irregular.  You will have occasional bloating and cramping that should eventually fade away.  Until you are eating solid food normally, off all pain medications, and back to regular activities; your bowels will not be normal.   Avoiding constipation The goal: ONE SOFT BOWEL MOVEMENT A DAY!    Drink plenty of fluids.  Choose water first. TAKE A FIBER SUPPLEMENT EVERY DAY THE REST OF YOUR LIFE During your first week back home, gradually add back a fiber supplement every day Experiment which form you can tolerate.   There are many forms such as powders, tablets, wafers, gummies, etc Psyllium bran (Metamucil), methylcellulose (Citrucel), Miralax  or Glycolax , Benefiber, Flax Seed.  Adjust the dose week-by-week (1/2 dose/day to 6 doses a day) until you are moving your bowels 1-2 times a day.  Cut back the dose or try a different fiber product if it is giving you problems such as diarrhea or bloating. Sometimes a laxative is needed to help jump-start bowels if  constipated until the fiber supplement can help regulate your bowels.  If you are tolerating eating & you are farting, it is okay to try a gentle laxative such as double dose MiraLax , prune juice, or Milk of Magnesia.  Avoid using laxatives too often. Stool softeners can sometimes help counteract the constipating effects of narcotic pain medicines.  It can also cause diarrhea, so avoid using for too long. If you are still constipated despite taking fiber daily, eating solids, and a few doses of laxatives, call our office. Controlling diarrhea Try drinking liquids and eating bland foods for a few days to avoid stressing your intestines further. Avoid dairy products (especially milk & ice cream) for a short time.  The intestines often can lose the ability to digest lactose when stressed. Avoid foods that cause gassiness or bloating.  Typical foods include beans and other legumes, cabbage, broccoli, and dairy foods.  Avoid greasy, spicy, fast foods.  Every person has some sensitivity to other foods, so listen to your body and avoid those foods that trigger problems for you. Probiotics (such as active yogurt, Align, etc) may help repopulate the intestines and colon with normal bacteria and calm down a sensitive digestive tract Adding a fiber supplement gradually can help thicken stools by absorbing excess fluid and retrain the intestines to act more normally.  Slowly increase the dose over a few weeks.  Too much fiber too soon can backfire and cause cramping & bloating. It is okay to try and slow down diarrhea with a few doses of antidiarrheal medicines.   Bismuth subsalicylate (ex. Kayopectate, Pepto Bismol) for a few doses can help control diarrhea.  Avoid if pregnant.   Loperamide (Imodium) can slow down diarrhea.  Start with one tablet (2mg ) first.  Avoid if you are having fevers or severe pain.  ILEOSTOMY PATIENTS WILL HAVE CHRONIC DIARRHEA since their colon is not in use.    Drink plenty of liquids.   You will need to drink even more glasses of water/liquid a day to avoid getting dehydrated. Record output from your ileostomy.  Expect to empty the bag every 3-4 hours at first.  Most people with a permanent ileostomy empty their bag 4-6 times at the least.   Use antidiarrheal medicine (especially Imodium) several times a day to avoid getting dehydrated.  Start with a dose at bedtime & breakfast.  Adjust up or down as needed.  Increase antidiarrheal medications as directed to avoid emptying the bag more than 8 times a day (every 3 hours). Work with your wound ostomy nurse to learn care for your ostomy.  See ostomy care instructions. TROUBLESHOOTING IRREGULAR BOWELS 1) Start with a soft & bland diet. No spicy, greasy, or fried foods.  2) Avoid gluten/wheat or dairy products from diet to see if symptoms improve. 3) Miralax  17gm or flax seed mixed in 8oz. water or juice-daily. May use 2-4 times a day as needed. 4) Gas-X, Phazyme, etc. as needed for gas & bloating.  5) Prilosec (omeprazole ) over-the-counter as needed 6)  Consider probiotics (Align, Activa, etc) to help calm the bowels down  Call your doctor if you are getting worse or not getting better.  Sometimes further testing (cultures, endoscopy, X-ray studies, CT scans, bloodwork, etc.) may be needed to help diagnose and treat the cause of the diarrhea. [facility] [address], [address], [place], [place]  [address] [phone] - Main.    [phone]  - Toll Free.   [phone] - Fax [email or web address]

## 2024-07-28 NOTE — Progress Notes (Signed)
 Called report to [facility] nurse Shanda. Waiting on patient transport to take patient down to 3 E.

## 2024-07-28 NOTE — Anesthesia Postprocedure Evaluation (Signed)
"   Anesthesia Post Note  Patient: Travis Nguyen  Procedure(s) Performed: LAPAROSCOPY, DIAGNOSTIC, lysis of adhesions, small bowel resection, APPENDECTOMY, laparoscopic     Patient location during evaluation: PACU Anesthesia Type: General Level of consciousness: awake and alert Pain management: pain level controlled Vital Signs Assessment: post-procedure vital signs reviewed and stable Respiratory status: spontaneous breathing, nonlabored ventilation, respiratory function stable and patient connected to nasal cannula oxygen Cardiovascular status: blood pressure returned to baseline and stable Postop Assessment: no apparent nausea or vomiting Anesthetic complications: no   No notable events documented.  Last Vitals:  Vitals:   07/28/24 1500 07/28/24 1515  BP: (!) 160/70 (!) 147/74  Pulse:  84  Resp: 16 17  Temp:    SpO2: 100% 99%    Last Pain:  Vitals:   07/28/24 1505  TempSrc:   PainSc: 7                  [name]      "

## 2024-07-28 NOTE — Plan of Care (Signed)

## 2024-07-28 NOTE — Anesthesia Preprocedure Evaluation (Addendum)
"                                    Anesthesia Evaluation  Patient identified by MRN, date of birth, ID band Patient awake    Reviewed: Allergy & Precautions, NPO status , Patient's Chart, lab work & pertinent test results  Airway Mallampati: I  TM Distance: >3 FB Neck ROM: Full    Dental  (+) Edentulous Upper, Edentulous Lower   Pulmonary former smoker   breath sounds clear to auscultation       Cardiovascular hypertension, Pt. on medications + Valvular Problems/Murmurs  Rhythm:Regular Rate:Normal     Neuro/Psych  PSYCHIATRIC DISORDERS  Depression    negative neurological ROS     GI/Hepatic negative GI ROS, Neg liver ROS,,,  Endo/Other  negative endocrine ROS    Renal/GU negative Renal ROS     Musculoskeletal  (+) Arthritis ,    Abdominal   Peds  Hematology  (+) Blood dyscrasia, anemia   Anesthesia Other Findings NGT in place  Reproductive/Obstetrics                              Anesthesia Physical Anesthesia Plan  ASA: 2  Anesthesia Plan: General   Post-op Pain Management: Tylenol  PO (pre-op)* and Toradol  IV (intra-op)*   Induction: Intravenous  PONV Risk Score and Plan: 3 and Ondansetron , Dexamethasone  and Midazolam   Airway Management Planned: Oral ETT  Additional Equipment: None  Intra-op Plan:   Post-operative Plan: Extubation in OR  Informed Consent: I have reviewed the patients History and Physical, chart, labs and discussed the procedure including the risks, benefits and alternatives for the proposed anesthesia with the patient or authorized representative who has indicated his/her understanding and acceptance.     Dental advisory given  Plan Discussed with: CRNA  Anesthesia Plan Comments:          Anesthesia Quick Evaluation  "

## 2024-07-28 NOTE — Transfer of Care (Signed)
 Immediate Anesthesia Transfer of Care Note  Patient: Travis Nguyen  Procedure(s) Performed: LAPAROSCOPY, DIAGNOSTIC, lysis of adhesions, small bowel resection, APPENDECTOMY, laparoscopic  Patient Location: PACU  Anesthesia Type:General  Level of Consciousness: awake and alert   Airway & Oxygen Therapy: Patient Spontanous Breathing and Patient connected to face mask oxygen  Post-op Assessment: Report given to RN and Post -op Vital signs reviewed and stable  Post vital signs: Reviewed and stable  Last Vitals:  Vitals Value Taken Time  BP 154/71 07/28/24 14:45  Temp    Pulse 75 07/28/24 14:47  Resp 20 07/28/24 14:47  SpO2 100 % 07/28/24 14:47  Vitals shown include unfiled device data.  Last Pain:  Vitals:   07/28/24 1100  TempSrc: Oral  PainSc: 9       Patients Stated Pain Goal: 0 (07/28/24 0805)  Complications: No notable events documented.

## 2024-07-28 NOTE — Interval H&P Note (Signed)
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 small-bowel obstruction. Electronically Signed   By: Toribio Agreste M.D.   On: 07/22/2024 09:12   DG Abd 1 View Result Date: 07/22/2024 EXAM: 1 VIEW XRAY OF THE ABDOMEN 07/22/2024 12:31:00 AM COMPARISON: 07/21/2024 CLINICAL HISTORY: Nasogastric tube present FINDINGS: LINES, TUBES AND DEVICES: Treatment tube tip projects over the junction advancement is recommended if placement in the stomach is desired. BOWEL: Multiple dilated loops of small bowel in the abdomen measuring up to 4.2 cm. Consistent with small bowel obstruction. No improvement since previous study. SOFT TISSUES: Right upper quadrant surgical clips, likely from cholecystectomy. No abnormal calcifications. BONES: Degenerative changes in the spine and hips. No acute fracture. IMPRESSION: 1. Nasogastric tube tip projects over the gastroesophageal junction; advancement is recommended if placement in the stomach is desired. 2. Small bowel obstruction with no improvement since previous study. Electronically signed by: Elsie Gravely MD 07/22/2024 12:35 AM EST RP Workstation: [id number]    DG Abd 1 View Result Date: 07/21/2024 EXAM: 1 VIEW XRAY OF THE ABDOMEN 07/21/2024 10:31:00 PM COMPARISON: 07/21/2024 CLINICAL HISTORY: Encounter for nasogastric (NG) tube placement FINDINGS: LINES, TUBES AND DEVICES: Enteric tube in place with tip overlying Mid Esophagus and side port at Thoracic Inlet Level. Right chest Port-A-Cath in place with tip overlying Superior Cavoatrial Junction. BOWEL: Multiple loops of dilated small bowel within Central Abdomen measuring up to 5 cm, unchanged from Prior Study. SOFT TISSUES: Right Upper Quadrant surgical clips noted. No abnormal calcifications. BONES: No acute fracture. LUNGS: Bibasilar Atelectasis. IMPRESSION: 1. Enteric tube tip overlies the mid esophagus with side port at the thoracic inlet level; recommend advancement 12 cm. 2. Unchanged findings compatible with small bowel obstruction. Electronically signed by: Greig Pique MD MD 07/21/2024 10:36 PM EST RP Workstation: [id number]   DG Abdomen 1 View Result Date: 07/21/2024 CLINICAL DATA:  Evaluate nasogastric tube placement. EXAM: ABDOMEN - 1 VIEW COMPARISON:  07/21/2024 FINDINGS: Nasogastric tube tip is in the left upper abdomen probably near the gastric cardia. Central line tip is in the upper right atrium region. Again noted are dilated loops of small bowel in the central abdomen and gaseous distension of the stomach. Cholecystectomy clips. IMPRESSION: 1. Nasogastric tube tip is in the left upper abdomen, likely near the gastric cardia. 2. Persistent dilated loops of small bowel. Electronically Signed   By: Juliene Balder M.D.   On: 07/21/2024 12:18   DG Abd Portable 1V-Small Bowel Protocol-Position Verification Result Date: 07/21/2024 EXAM: 1 VIEW XRAY OF THE ABDOMEN 07/21/2024 11:02:14 AM COMPARISON: 07/21/2024 CLINICAL HISTORY: Encounter for imaging study to confirm nasogastric (NG) tube placement FINDINGS: LINES, TUBES AND DEVICES: Interval retraction of an enteric tube with a hair-pin loop overlying the  mid to lower mediastinum with the tip coursing cranially collimated off view. BOWEL: Persistent multiple dilated loops of small bowel. SOFT TISSUES: Right upper quadrant surgical clips noted. No abnormal calcifications. BONES: No acute fracture. IMPRESSION: 1. Interval retraction of the  enteric tube with a hairpin loop overlying the mid to lower mediastinum, with the tip coursing cranially and collimated off view. 2. Persistent multiple dilated loops of small bowel. Electronically signed by: Morgane Naveau MD 07/21/2024 11:10 AM EST RP Workstation: [id number]   DG Abd Portable 1 View Result Date: 07/21/2024 CLINICAL DATA:  NG tube placement. EXAM: PORTABLE ABDOMEN - 1 VIEW COMPARISON:  07/21/2024 at 3:17 a.m. FINDINGS: Nasogastric tube courses into the region of the distal esophagus and turns back 180 degrees continues up into the esophagus and off the superior image as tip is not visualized. Side-port appears to be visualized at the T6-7 level. This needs to be retracted significantly before being repositioned. Continued evidence of patient's known small bowel obstruction without significant change. Remainder of the exam is unchanged. IMPRESSION: 1. Nasogastric tube courses into the region of the distal esophagus and turns back 180 degrees and continues up into the esophagus and off the superior image as tip is not visualized. This needs to be retracted significantly before being repositioned. 2. Continued evidence of patient's known small bowel obstruction. Electronically Signed   By: Toribio Agreste M.D.   On: 07/21/2024 10:41   CT ABDOMEN PELVIS W CONTRAST Result Date: 07/21/2024 CLINICAL DATA:  Abdominal pain and constipation. History of multiple myeloma and melanoma as well as prostate cancer. EXAM: CT ABDOMEN AND PELVIS WITH CONTRAST TECHNIQUE: Multidetector CT imaging of the abdomen and pelvis was performed using the standard protocol following bolus administration of intravenous contrast. RADIATION DOSE  REDUCTION: This exam was performed according to the departmental dose-optimization program which includes automated exposure control, adjustment of the mA and/or kV according to patient size and/or use of iterative reconstruction technique. CONTRAST:  OMNIPAQUE  IOHEXOL  300 MG/ML  SOLN COMPARISON:  05/04/2024 FINDINGS: Lower chest: Dependent atelectasis noted in the lung bases, right greater than left. Hepatobiliary: 2 cm subcapsular lesion posterior right liver is stable, previously characterized as hemangioma. Gallbladder surgically absent. Common bile duct is 7-8 mm diameter, stable and upper normal to borderline increased for patient age although this may reflect sequelae of prior cholecystectomy. Pancreas: Similar diffuse dilatation of the main pancreatic duct. No obstructing mass lesion evident in the head of the pancreas. Spleen: No splenomegaly. No suspicious focal mass lesion. Adrenals/Urinary Tract: No adrenal nodule or mass. Stable simple cysts in both kidneys. Probable nonobstructing 3 mm stone upper pole right kidney. Tiny well-defined homogeneous low-density lesion in the left kidney is too small to characterize but is statistically most likely benign and probably a cyst. No followup imaging is recommended. Mild fullness noted in both intrarenal collecting systems and ureters. Bladder unremarkable. Stomach/Bowel: Stomach is markedly distended with food and fluid although antral region is collapsed, potentially secondary to peristalsis although distal gastric edema/inflammation/antritis is not excluded. Course of the duodenum is difficult to delineate in passage under the SMA cannot be confirmed although it did appear to pass into the SMA on the study from 01/20/2023. There is marked dilatation of small bowel loops in the abdomen and pelvis measuring up to 4.2 cm diameter in the abdomen and 3.9 cm diameter in the pelvis. Fecalization of enteric contents is most prominent in a loop of small bowel  just anterior to the left psoas muscle in the lower abdomen that tracks into the pelvis where there is an apparent transition zone visible on axial image 70 of series 2 in the left pelvis also visible on coronal image 57 of series 7 as an abrupt narrowing. Terminal ileum and  appendix are not well visualized. Small to moderate stool volume noted in the right and transverse colon as well as the rectum. Vascular/Lymphatic: No abdominal aortic aneurysm. No abdominal aortic atherosclerotic calcification. There is no gastrohepatic or hepatoduodenal ligament lymphadenopathy. No retroperitoneal or mesenteric lymphadenopathy. No pelvic sidewall lymphadenopathy. Reproductive: Prostatectomy. Other: Small volume free fluid is identified in the abdomen and pelvis. Musculoskeletal: Marked degenerative changes noted in both hips. Bones are heterogeneously mineralized with innumerable tiny lucent and sclerotic areas in the bony anatomy compatible with the patient's reported history of multiple myeloma. IMPRESSION: 1. Marked dilatation of small bowel loops in the abdomen and pelvis with fecalization of enteric contents in a small bowel loop of the lower abdomen and pelvis. This loop tracks into an apparent transition zone in the left pelvis. Imaging features are compatible with small bowel obstruction, likely mechanical with left pelvic adhesion a consideration. No evidence for small bowel wall thickening or pneumatosis at this time. 2. Stomach is markedly distended with food and fluid although antral region is collapsed, potentially secondary to peristalsis although distal gastric edema/inflammation is not excluded. 3. Small volume free fluid in the abdomen and pelvis. 4. Mild fullness in both intrarenal collecting systems and ureters. 5. Probable nonobstructing 3 mm stone upper pole right kidney. 6. Bones are heterogeneously mineralized with innumerable tiny lucent and sclerotic areas in the bony anatomy compatible with the  patient's reported history of multiple myeloma. Electronically Signed   By: Camellia Candle M.D.   On: 07/21/2024 06:04   DG Abdomen 1 View Result Date: 07/21/2024 EXAM: 1 VIEW XRAY OF THE ABDOMEN 07/21/2024 03:21:00 AM COMPARISON: CT abdomen and pelvis 05/04/2024. CLINICAL HISTORY: constipation FINDINGS: BOWEL: There are dilated upper to mid-abdominal small bowel segments measuring up to 4.6 cm concerning for a small bowel obstruction. There is mild to moderate retained stool. CT is recommended. SOFT TISSUES: Cholecystectomy clips. BONES: No acute fracture. IMPRESSION: 1. Mild to moderate retained stool. 2. Dilated upper to mid-abdominal small bowel segments measuring up to 4.6 cm, concerning for small bowel obstruction. CT is recommended. Electronically signed by: Francis Quam MD 07/21/2024 03:36 AM EST RP Workstation: [id number]     .Elspeth KYM Schultze, M.D., F.A.C.S. Gastrointestinal and Minimally Invasive Surgery [facility] [address], [address] [place], [place] [address] [phone] Main / Paging  07/28/2024 10:56 AM    Elspeth JAYSON Schultze      [1]  Allergies Allergen Reactions   Aspirin     Pt stated raises BP

## 2024-07-28 NOTE — Op Note (Signed)
 " PATIENT:  Travis Nguyen  74 y.o. 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  This assistant was needed for exposure, dissection, suction, tissue approximation, retraction, perception, etc  ANESTHESIA:  General endotracheal intubation anesthesia (GETA) and Regional TRANSVERSUS ABDOMINIS PLANE (TAP) nerve block -BILATERAL for perioperative & postoperative pain control at the level of the transverse abdominis & preperitoneal spaces along the flank at the anterior axillary line, from subcostal ridge to iliac crest under laparoscopic guidance provided with 60 mL of bupivicaine 0.25% with epinephrine   Estimated Blood Loss (EBL):   Total I/O In: 100 [IV Piggyback:100] Out: 500 [Urine:300; Emesis/NG output:200].   (See anesthesia record)  Delay start of Pharmacological VTE agent (>24hrs) due to concerns of significant anemia, surgical blood loss, or risk of bleeding?:  no  DRAINS: (None)  SPECIMEN:  Small intestine  and Appendix  DISPOSITION OF SPECIMEN:  Pathology  COUNTS:  Sponge, needle, & instrument counts CORRECT at the conclusion of the case.      PLAN OF CARE: Admit to inpatient   PATIENT DISPOSITION:  PACU - hemodynamically stable.   INDICATIONS: Patient with concerning symptoms & work up suspicious for appendicitis.  Surgery was recommended:  The anatomy & physiology of the digestive tract was  discussed.  The pathophysiology of appendicitis was discussed.  Natural history risks without surgery was discussed.   I feel the risks of no intervention will lead to serious problems that outweigh the operative risks; therefore, I recommended diagnostic laparoscopy with removal of appendix to remove the pathology.  Laparoscopic & open techniques were discussed.   I noted a good likelihood this will help address the problem.    Risks such as bleeding, infection, abscess, leak, reoperation, possible ostomy, hernia, heart attack, death, and other risks were discussed.  Goals of post-operative recovery were discussed as well.  We will work to minimize complications.  Questions were answered.  The patient expresses understanding & wishes to proceed with surgery.  OR FINDINGS:  Very dense adhesion of the distal ileum to pubis at site of prior prostatectomy.  Point of twisting/mobilization and chronic obstruction.  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  Sure ileum resection done.  Appendix with secondary adhesions.  Appendectomy done as well.  CASE DATA: Type of patient?: LDOW CASE (Surgical Hospitalist [facility] Inpatient) Status of Case? URGENT Add On Infection Present At Time Of Surgery (PATOS)?  NO  DESCRIPTION:   Informed consent was confirmed.  The patient underwent general anaesthesia without difficulty.  The patient was positioned appropriately.  VTE prevention in place.  The patient's abdomen was clipped, prepped, & draped in a sterile fashion.  Surgical timeout confirmed our plan.  Peritoneal entry with a laparoscopic port was obtained using Varess spring needle entry technique in the left upper abdomen as the patient was positioned in reverse Trendelenburg.  I induced carbon dioxide insufflation.  No change in end tidal CO2 measurements.  Full symmetrical abdominal distention.  Initial port was carefully  placed.  Camera inspection revealed no injury.  Extra ports were carefully placed under  direct laparoscopic visualization.  Encountered very dilated small intestine.  Omental adhesions were freed off.  He did not seem to be a point of obstruction.  Patient positioned in deep Trendelenburg to allow most of the small bowel to free off.  Was able to get enough exposure down to the pelvis where a transition's point was suspected.  Patient had a loop of distal ileum densely adherent to the pubic tubercle at the site of prior prostatectomy.  The small bowel was spun and corkscrewing around itself as a point of chronic bowel visualization/obstruction.  I did sharp cold scissors to free off this thickened loop of ileum off.  Did inspection to confirm no injury.  I ran the small bowel from the ileocecal valve more proximally and encountered no other adhesions or other concerns.  To inspection ileocecal region.  There were some dense adhesions in this region with most of the appendix being near by.  Ended up mobilizing ileocecal region and its lateral to medial fashion.  Better exposure and inspected the more distal bowel.  The thickened band in the very distal ileum did not dilate up and felt very thick walled and fibrotic and not inflammatory.  There is no ischemia.  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  I therefore created a Pfannenstiel incision and placed a wound protector and eviscerated small bowel.  Found that very thick and persistent segment.  Thickened appendix and tic nearby decided do a bowel resection.  Used a 75 GIA to do a side-to-side stapled anastomosis of proximal and distal ileum.  Stapled off and closed the common defect with a TX 90 stapler.  Took in the mesentery with clamp and silk ties to remove that 10 cm segment of bowel containing the thickened stricture.  Sutures to abdominal corners of the TX 90 staple line to imbricated well.  Using mesentery to lay over the TX 90 staple line and closing his mesentery transversely further with mesenteropexy..  A stitch at the proximal end  anastomosis for the plastic crotch antitension stitch.  Inspection of the appendix was somewhat thickened and irritated.  Not consistent with acute appendicitis but given the adhesions in the region decided proceed with appendectomy.  Out of window at the base of the appendix and stapled appendix off of a small cuff of cecum using a stapler.  Took the mesentery with clamps and ties sent that off.  I ran the small bowel again from the cecum more proximal to the ligament of Treitz and confirm no injury or other concerns.  Anastomosis laid well.  Returned to laparoscopic investigation so no injury or concerns.  Patient had very little omentum that would not reach down to the pelvis.  Carbon dioxide was evacuated.  Ports removed.  Wound protector removed.  Close a Pfannenstiel incision using 0 Vicryl running suture in the retrorectus/peritoneum vertically.  Closing into rectus fascia transversely with running #1 PDS to be resolved.  Pfannenstiel incision and port sites closed with Monocryl.  Sterile dressing applied.    Patient was extubated and sent to the recovery room.  I discussed operative findings, updated the patient's status, discussed probable steps to recovery, and gave postoperative recommendations to the patient's spouse.  Recommendations were made.  Questions were answered.  She expressed understanding & appreciation.  Questions answered. They expressed understanding and appreciation.  Elspeth KYM Schultze, M.D., F.A.C.S. Gastrointestinal and Minimally Invasive Surgery [facility] [address] [place], [place] [address] [phone] Main / Paging  07/28/2024 2:16 PM  "

## 2024-07-28 NOTE — Progress Notes (Signed)
 " PROGRESS NOTE  Travis Nguyen FMW:[id number] DOB: [birth year] DOA: 07/21/2024 PCP: [name], MD   LOS: 7 days   Brief Narrative / Interim history: 74 year old male with history of multiple myeloma on carfilzomib  as well as daratumumab , HTN, prior prostatectomy, prior SBO's comes into the hospital with small bowel obstruction.  General surgery consulted, he has failed conservative management and will undergo surgery today  Subjective / 24h Interval events: Doing well this morning, awaiting surgery  Assesement and Plan: Principal problem Small bowel obstruction-he was admitted to the hospital, NG tube was placed.  He has not improved with conservative management and will be taken to the OR today.  Will follow on operative report - Appreciate surgical consultation  Active problems Sinus bradycardia-stable  Essential hypertension-elevated blood pressure, continue PRNs for now, resume home medications postoperatively when he can eat  Multiple myeloma-outpatient management  Normocytic anemia-in the setting of underlying malignancy  Scheduled Meds:  [MAR Hold] Chlorhexidine  Gluconate Cloth  6 each Topical Daily   [MAR Hold] sodium chloride  flush  10-40 mL Intracatheter Q12H   Continuous Infusions:  0.9 % NaCl with KCl 20 mEq / L 100 mL/hr at 07/28/24 0446   acetaminophen      cefoTEtan  (CEFOTAN ) IV     [MAR Hold] lactated ringers      PRN Meds:.acetaminophen , [DISCONTINUED] acetaminophen  **OR** [MAR Hold] acetaminophen , [MAR Hold] alum & mag hydroxide-simeth, [MAR Hold] hydrALAZINE , [MAR Hold]  HYDROmorphone  (DILAUDID ) injection, [MAR Hold] labetalol , [MAR Hold] lactated ringers , [MAR Hold] magic mouthwash, [MAR Hold] menthol , [MAR Hold] ondansetron  **OR** [MAR Hold] ondansetron  (ZOFRAN ) IV, [MAR Hold] mouth rinse, [MAR Hold] phenol, [MAR Hold] simethicone , [MAR Hold] sodium chloride  flush  Current Outpatient Medications  Medication Instructions   acyclovir  (ZOVIRAX ) 400 mg,  Oral, 2 times daily   amLODipine  (NORVASC ) 5 mg, Oral, Daily   aspirin EC 81 mg, Oral, Daily, Swallow whole.   lactulose  (CHRONULAC ) 10 g, Oral, Daily PRN   lidocaine -prilocaine  (EMLA ) cream APPLY TOPICALLY TO PORT-A-CATH DAILY AS NEEDED   losartan  (COZAAR ) 100 mg, Oral, Daily   methocarbamol  (ROBAXIN ) 500 mg, Oral, Every 6 hours PRN   oxyCODONE -acetaminophen  (PERCOCET ) 10-325 MG tablet 1 tablet, Oral, 2 times daily PRN   oxyCODONE -acetaminophen  (PERCOCET ) 10-325 MG tablet 1 tablet, Oral, 2 times daily PRN   POMALYST  2 MG capsule TAKE 1 CAPSULE BY MOUTH DAILY  FOR 21 DAYS, THEN 7 DAYS OFF   senna-docusate (SENNA S) 8.6-50 MG tablet 1 tablet, Oral, Daily at bedtime    Diet Orders (From admission, onward)     Start     Ordered   07/27/24 1410  Diet NPO time specified Except for: [employer], Sips with Meds  Diet effective now       Question Answer Comment  Except for Ice Chips   Except for Sips with Meds      07/27/24 1410            DVT prophylaxis: SCD's Start: 07/27/24 1410 SCDs Start: 07/21/24 0917   Lab Results  Component Value Date   PLT 212 07/28/2024      Code Status: Full Code  Family Communication: No family at bedside  Status is: Inpatient Remains inpatient appropriate because: Severity of illness   Level of care: Med-Surg  Consultants:  General surgery  Objective: Vitals:   07/28/24 0447 07/28/24 0449 07/28/24 0611 07/28/24 1100  BP: (!) 169/87 (!) 169/87 (!) 157/81 (!) 143/103  Pulse: 77  79 79  Resp: 18  18 18   Temp: 98.7  F (37.1 C)  98.7 F (37.1 C) 98.2 F (36.8 C)  TempSrc: Oral  Oral Oral  SpO2: 99%  99% 97%  Weight:      Height:        Intake/Output Summary (Last 24 hours) at 07/28/2024 1147 Last data filed at 07/28/2024 9191 Gross per 24 hour  Intake 1719.39 ml  Output 2850 ml  Net -1130.61 ml   Wt Readings from Last 3 Encounters:  07/21/24 65.6 kg  07/16/24 65.4 kg  07/07/24 69.9 kg    Examination:  Constitutional:  NAD Eyes: no scleral icterus ENMT: Mucous membranes are moist.  NG tube in place Neck: normal, supple Respiratory: clear to auscultation bilaterally, no wheezing, no crackles. Normal respiratory effort.  Cardiovascular: Regular rate and rhythm, no murmurs / rubs / gallops. No LE edema.  Abdomen: non distended, no tenderness. Bowel sounds positive.  Musculoskeletal: no clubbing / cyanosis.   Data Reviewed: I have independently reviewed following labs and imaging studies   CBC Recent Labs  Lab 07/24/24 0411 07/25/24 0543 07/26/24 0459 07/27/24 1350 07/28/24 0420  WBC 3.2* 4.1 4.3 5.0 4.9  HGB 10.0* 10.1* 9.1* 8.9* 8.4*  HCT 31.5* 31.5* 28.1* 27.7* 26.8*  PLT 202 243 204 226 212  MCV 105.0* 104.3* 104.5* 102.6* 106.3*  MCH 33.3 33.4 33.8 33.0 33.3  MCHC 31.7 32.1 32.4 32.1 31.3  RDW 14.9 14.6 14.4 14.0 14.0    Recent Labs  Lab 07/22/24 0734 07/23/24 0724 07/24/24 0411 07/25/24 0543 07/26/24 0459 07/28/24 0420  NA 135 138 139 138 135 135  K 4.8 4.4 4.3 3.9 3.6 4.2  CL 101 106 108 105 102 104  CO2 23 21* 20* 21* 24 24  GLUCOSE 80 85 89 109* 90 70  BUN 9 13 14 12 10  6*  CREATININE 0.69 0.76 0.67 0.73 0.66 0.60*  CALCIUM 9.1 9.1 8.7* 8.9 8.6* 8.1*  AST 17 15  --   --  15 17  ALT 12 13  --   --  12 9  ALKPHOS 72 65  --   --  58 56  BILITOT 0.7 0.6  --   --  0.8 0.5  ALBUMIN 3.9 3.8  --   --  3.4* 3.1*    ------------------------------------------------------------------------------------------------------------------ No results for input(s): CHOL, HDL, LDLCALC, TRIG, CHOLHDL, LDLDIRECT in the last 72 hours.  Lab Results  Component Value Date   HGBA1C 5.3 05/18/2016   ------------------------------------------------------------------------------------------------------------------ No results for input(s): TSH, T4TOTAL, T3FREE, THYROIDAB in the last 72 hours.  Invalid input(s): FREET3  Cardiac Enzymes No results for input(s): CKMB,  TROPONINI, MYOGLOBIN in the last 168 hours.  Invalid input(s): CK ------------------------------------------------------------------------------------------------------------------    Component Value Date/Time   BNP 44.1 08/04/2014 1358    CBG: Recent Labs  Lab 07/23/24 2308  GLUCAP 89    No results found for this or any previous visit (from the past 240 hours).   Radiology Studies: DG Abd 1 View Result Date: 07/28/2024 EXAM: 1 VIEW XRAY OF THE ABDOMEN 07/28/2024 01:24:00 AM COMPARISON: 07/27/2024 CLINICAL HISTORY: The patient has a small bowel obstruction and required nasogastric tube placement. ICD10: 881154 - Small bowel obstruction (HCC). FINDINGS: LINES, TUBES AND DEVICES: Enteric tube in place with tip and side port projecting over the expected region of the gastric body. BOWEL: Numerous gas-filled dilated loops of small bowel are again seen throughout the abdomen, similar to multiple prior examinations. There is slow transit of contrast through the colon which appears stable since immediate prior  examination it appears progressive since more remote prior examination of 01/11 suggesting a high-grade partial small bowel obstruction. Diverticulosis noted of the visualized distal colon. No free intraperitoneal gas. SOFT TISSUES: Cholecystectomy clips noted. No abnormal calcifications. BONES: No acute fracture. IMPRESSION: 1. High-grade partial small bowel obstruction 2. Enteric tube tip and side port project over the gastric body. 3. Colonic diverticulosis. Electronically signed by: Dorethia Molt MD 07/28/2024 01:31 AM EST RP Workstation: [id number]   DG Abd Portable 1V Result Date: 07/27/2024 EXAM: 1 VIEW XRAY OF THE ABDOMEN 07/27/2024 10:54:00 PM COMPARISON: 07/27/2024 CLINICAL HISTORY: The patient has a small bowel obstruction (SBO). ICD10: 881154 - Small bowel obstruction. FINDINGS: LINES, TUBES AND DEVICES: Gastric catheter is now noted in the left upper quadrant. BOWEL: Contrast  is noted within the colon. Persistent mild small bowel dilatation is seen without free air. No other focal abnormality is seen. SOFT TISSUES: No abnormal calcifications. BONES: No acute fracture. IMPRESSION: 1. Persistent mild small bowel obstruction. 2. No free air. Electronically signed by: Oneil Devonshire MD 07/27/2024 11:24 PM EST RP Workstation: [id number]   DG Abd Portable 1V Result Date: 07/27/2024 EXAM: 1 VIEW XRAY OF THE ABDOMEN 07/27/2024 07:59:00 PM COMPARISON: 07/27/2024 CLINICAL HISTORY: The patient has a small bowel obstruction (SBO). ICD10: 881154 - Small bowel obstruction (HCC). FINDINGS: LINES, TUBES AND DEVICES: Partially imaged central venous catheter with tip overlying the RIGHT atrium. If a NG has been placed, evaluation over the chest is recommended to assess for coiling. BOWEL: Gas distended small bowel and colon. Contrast is noted within the colon. Persistent small bowel dilatation is noted. SOFT TISSUES: Cholecystectomy clips noted. No abnormal calcifications. BONES: No acute fracture. IMPRESSION: 1. Persistent small bowel dilatation, consistent with small bowel obstruction. 2. No nasogastric catheter is identified, if one has been placed, recommend chest radiograph to assess for coiling. Electronically signed by: Oneil Devonshire MD 07/27/2024 08:04 PM EST RP Workstation: [id number] Nilda Fendt, MD, PhD [facility]  Between 7 am - 7 pm I am available, please contact me via Amion (for emergencies) or Securechat (non urgent messages)  Between 7 pm - 7 am I am not available, please contact night coverage MD/APP via Amion  "

## 2024-07-29 ENCOUNTER — Encounter (HOSPITAL_COMMUNITY): Payer: Self-pay | Admitting: Surgery

## 2024-07-29 DIAGNOSIS — K56609 Unspecified intestinal obstruction, unspecified as to partial versus complete obstruction: Secondary | ICD-10-CM | POA: Diagnosis not present

## 2024-07-29 LAB — GLUCOSE, CAPILLARY: Glucose-Capillary: 62 mg/dL — ABNORMAL LOW (ref 70–99)

## 2024-07-29 MED ORDER — ACETAMINOPHEN 650 MG RE SUPP: 650.0000 mg | Freq: Four times a day (QID) | RECTAL | Status: DC | PRN

## 2024-07-29 MED ORDER — HYDROMORPHONE HCL 1 MG/ML IJ SOLN
0.5000 mg | INTRAMUSCULAR | Status: DC | PRN
  Administered 2024-07-29 (×5): 2 mg via INTRAVENOUS
  Administered 2024-07-30: 1 mg via INTRAVENOUS
  Administered 2024-07-30: 2 mg via INTRAVENOUS
  Administered 2024-07-30: 1 mg via INTRAVENOUS
  Administered 2024-07-30 (×3): 2 mg via INTRAVENOUS
  Administered 2024-07-31 (×2): 1 mg via INTRAVENOUS
  Administered 2024-07-31 (×2): 2 mg via INTRAVENOUS
  Administered 2024-07-31 (×3): 1 mg via INTRAVENOUS
  Administered 2024-08-01 (×3): 2 mg via INTRAVENOUS
  Administered 2024-08-01: 1 mg via INTRAVENOUS
  Administered 2024-08-01 – 2024-08-02 (×4): 2 mg via INTRAVENOUS
  Filled 2024-07-29 (×2): qty 2
  Filled 2024-07-29 (×4): qty 1
  Filled 2024-07-29: qty 2
  Filled 2024-07-29: qty 1
  Filled 2024-07-29 (×4): qty 2
  Filled 2024-07-29: qty 1
  Filled 2024-07-29 (×11): qty 2
  Filled 2024-07-29 (×3): qty 1

## 2024-07-29 MED ORDER — METHOCARBAMOL 1000 MG/10ML IJ SOLN
500.0000 mg | Freq: Three times a day (TID) | INTRAMUSCULAR | Status: DC
  Administered 2024-07-29 – 2024-07-30 (×4): 500 mg via INTRAVENOUS
  Filled 2024-07-29 (×4): qty 10

## 2024-07-29 NOTE — Progress Notes (Signed)
 07/29/2024  Travis Nguyen [id number] 1950-11-16  CARE TEAM: PCP: [name], MD  Outpatient Care Team: Patient Care Team: [name], MD as PCP - General (Family Medicine) [name], MD (Inactive) as Consulting Physician (Urology)  Inpatient Treatment Team: Treatment Team:  Trixie Nilda HERO, MD Ccs, Md, MD Bobbette Pinon, MD Feliberto Graces, RN Louann Pen, [facility] Joannie Leader, NT Vicci Wilbert NOVAK, RN Ellie Alfonse SAUNDERS, RN Leigh Darice BRAVO, PT   Problem List:   Principal Problem:   SBO (small bowel obstruction) [facility]) Active Problems:   Multiple myeloma [facility])   Essential hypertension   Pure hypercholesterolemia   Sinus bradycardia   07/28/2024  Procedures: LAPAROSCOPY, DIAGNOSTIC, lysis of adhesions, small bowel resection, APPENDECTOMY, laparoscopic    Assessment [facility] Stay = 8 days) 1 Day Post-Op      Patient is recovering relatively well abdomen is non distended and firm at suture sites only.  Patients pain is uncontrolled with current dose of hydromorphone  will plan to increase  Hypertensive; likely multifactorial due to h/x of hypertension and pain.   HGB decreased from 8.9 to 8.4 following surgery, will continue to monitor.  Creatinine has decreased from 0.66 on 07/26/24 to 0.66. Will continue to monitor.   Plan:   - Increase dose of hydromorphone  PRN for pain.   -monitor electrolytes & replace as needed  Keep K>4, Mg>2, Phos>3  -VTE prophylaxis- SCDs.  Anticoagulation prophyllaxis SQ as appropriate  -mobilize as tolerated to help recovery.  Enlist therapies in moderate/high risk patients as appropriate    -Disposition:  1-3 days depending on patient recovery.   I reviewed last 24 h vitals and pain scores, last 48 h intake and output, last 24 h labs and trends, and last 24 h imaging results.  I have reviewed this patient's available data, including medical history, events of note, test results, etc as part of my  evaluation.   A significant portion of that time was spent in counseling. Care during the described time interval was provided by me.  This care required moderate level of medical decision making.  07/29/2024    Subjective: (Chief complaint)   Patient stated pain throughout the night was severe and that currently his pain is a 10/10. PRN pain meds have been given including acetaminophen  and hydromorphone .   He ambulated to the hall overnight. Foley catheter was removed this morning.   He has yet to urinate.   Denies N/V, flatus, BM or hiccups.   Objective:  Vital signs:  Vitals:   07/28/24 2145 07/29/24 0205 07/29/24 0532 07/29/24 1102  BP: (!) 154/89 (!) 164/86 (!) 146/73 (!) 151/78  Pulse: 79 72 78 75  Resp: 17 16 16 16   Temp: 97.8 F (36.6 C) 98.6 F (37 C) 99.2 F (37.3 C) 98.3 F (36.8 C)  TempSrc: Oral Oral Oral Oral  SpO2: 100% 100% 99% 97%  Weight:      Height:        Last BM Date : 07/24/24  Intake/Output   Yesterday:  01/14 0701 - 01/15 0700 In: 2028.8 [I.V.:1718.2; IV Piggyback:310.6] Out: 2700 [Urine:2350; Emesis/NG output:350] This shift:  Total I/O In: 121 [IV Piggyback:121] Out: 250 [Emesis/NG output:250]  Bowel function:  Flatus: No  BM:  No  Drain: (No drain)   Physical Exam:  General: Pt awake/alert in moderate acute distress Eyes: PERRL, normal EOM.  Sclera clear.  No icterus Neuro: CN II-XII intact w/o focal sensory/motor deficits. Lymph: No head/neck/groin lymphadenopathy Psych:  No delerium/psychosis/paranoia.  Oriented  x 4 HENT: Normocephalic, Mucus membranes moist.  No thrush Neck: Supple, No tracheal deviation.  No obvious thyromegaly Chest: No pain to chest wall compression.  Good respiratory excursion.  No audible wheezing CV:  Pulses intact.  Regular rhythm.  No major extremity edema MS: Normal AROM mjr joints.  No obvious deformity  Abdomen: Soft.  Nondistended.  Mildly tender at incisions only.  No evidence of  peritonitis.  No incarcerated hernias.  Ext:   No deformity.  No mjr edema.  No cyanosis Skin: No petechiae / purpurea.  No major sores.  Warm and dry    Results:   Cultures: No results found for this or any previous visit (from the past 720 hours).  Labs: Results for orders placed or performed during the hospital encounter of 07/21/24 (from the past 48 hours)  CBC     Status: Abnormal   Collection Time: 07/27/24  1:50 PM  Result Value Ref Range   WBC 5.0 4.0 - 10.5 K/uL   RBC 2.70 (L) 4.22 - 5.81 MIL/uL   Hemoglobin 8.9 (L) 13.0 - 17.0 g/dL   HCT 72.2 (L) 60.9 - 47.9 %   MCV 102.6 (H) 80.0 - 100.0 fL   MCH 33.0 26.0 - 34.0 pg   MCHC 32.1 30.0 - 36.0 g/dL   RDW 85.9 88.4 - 84.4 %   Platelets 226 150 - 400 K/uL   nRBC 0.0 0.0 - 0.2 %    Comment: Performed at [facility], [address] [place], [place] [address]  CBC     Status: Abnormal   Collection Time: 07/28/24  4:20 AM  Result Value Ref Range   WBC 4.9 4.0 - 10.5 K/uL   RBC 2.52 (L) 4.22 - 5.81 MIL/uL   Hemoglobin 8.4 (L) 13.0 - 17.0 g/dL   HCT 73.1 (L) 60.9 - 47.9 %   MCV 106.3 (H) 80.0 - 100.0 fL   MCH 33.3 26.0 - 34.0 pg   MCHC 31.3 30.0 - 36.0 g/dL   RDW 85.9 88.4 - 84.4 %   Platelets 212 150 - 400 K/uL   nRBC 0.0 0.0 - 0.2 %    Comment: Performed at [facility], [address], [place], [place] [address]  Comprehensive metabolic panel with GFR     Status: Abnormal   Collection Time: 07/28/24  4:20 AM  Result Value Ref Range   Sodium 135 135 - 145 mmol/L   Potassium 4.2 3.5 - 5.1 mmol/L   Chloride 104 98 - 111 mmol/L   CO2 24 22 - 32 mmol/L   Glucose, Bld 70 70 - 99 mg/dL    Comment: Glucose reference range applies only to samples taken after fasting for at least 8 hours.   BUN 6 (L) 8 - 23 mg/dL   Creatinine, Ser 9.39 (L) 0.61 - 1.24 mg/dL   Calcium 8.1 (L) 8.9 - 10.3 mg/dL   Total Protein 5.7 (L) 6.5 - 8.1 g/dL   Albumin 3.1 (L) 3.5 - 5.0 g/dL   AST 17 15 - 41  U/L   ALT 9 0 - 44 U/L   Alkaline Phosphatase 56 38 - 126 U/L   Total Bilirubin 0.5 0.0 - 1.2 mg/dL   GFR, Estimated >39 >39 mL/min    Comment: (NOTE) Calculated using the CKD-EPI Creatinine Equation (2021)    Anion gap 7 5 - 15    Comment: Performed at [facility], [address] [place], [place] [address]  Type  and screen [facility]     Status: None (Preliminary result)   Collection Time: 07/28/24 10:30 AM  Result Value Ref Range   ABO/RH(D) B NEG    Antibody Screen POS    Sample Expiration 07/31/2024,2359    Antibody Identification NON SPECIFIC ANTIBODY REACTIVITY    Weak D POS    Unit Number [id number]    Blood Component Type RED CELLS,LR    Unit division 00    Status of Unit ALLOCATED    Transfusion Status OK TO TRANSFUSE    Crossmatch Result      COMPATIBLE Performed at [facility], [address], [place], [place] [address]    Unit Number [id number]    Blood Component Type RED CELLS,LR    Unit division 00    Status of Unit ALLOCATED    Transfusion Status OK TO TRANSFUSE    Crossmatch Result COMPATIBLE   Glucose, capillary     Status: Abnormal   Collection Time: 07/29/24  7:52 AM  Result Value Ref Range   Glucose-Capillary 62 (L) 70 - 99 mg/dL    Comment: Glucose reference range applies only to samples taken after fasting for at least 8 hours.   *Note: Due to a large number of results and/or encounters for the requested time period, some results have not been displayed. A complete set of results can be found in Results Review.    Imaging / Studies: DG Abd 1 View Result Date: 07/28/2024 EXAM: 1 VIEW XRAY OF THE ABDOMEN 07/28/2024 01:24:00 AM COMPARISON: 07/27/2024 CLINICAL HISTORY: The patient has a small bowel obstruction and required nasogastric tube placement. ICD10: 881154 - Small bowel obstruction (HCC). FINDINGS: LINES, TUBES AND DEVICES: Enteric tube in place with tip and side port projecting  over the expected region of the gastric body. BOWEL: Numerous gas-filled dilated loops of small bowel are again seen throughout the abdomen, similar to multiple prior examinations. There is slow transit of contrast through the colon which appears stable since immediate prior examination it appears progressive since more remote prior examination of 01/11 suggesting a high-grade partial small bowel obstruction. Diverticulosis noted of the visualized distal colon. No free intraperitoneal gas. SOFT TISSUES: Cholecystectomy clips noted. No abnormal calcifications. BONES: No acute fracture. IMPRESSION: 1. High-grade partial small bowel obstruction 2. Enteric tube tip and side port project over the gastric body. 3. Colonic diverticulosis. Electronically signed by: Dorethia Molt MD 07/28/2024 01:31 AM EST RP Workstation: [id number]   DG Abd Portable 1V Result Date: 07/27/2024 EXAM: 1 VIEW XRAY OF THE ABDOMEN 07/27/2024 10:54:00 PM COMPARISON: 07/27/2024 CLINICAL HISTORY: The patient has a small bowel obstruction (SBO). ICD10: 881154 - Small bowel obstruction. FINDINGS: LINES, TUBES AND DEVICES: Gastric catheter is now noted in the left upper quadrant. BOWEL: Contrast is noted within the colon. Persistent mild small bowel dilatation is seen without free air. No other focal abnormality is seen. SOFT TISSUES: No abnormal calcifications. BONES: No acute fracture. IMPRESSION: 1. Persistent mild small bowel obstruction. 2. No free air. Electronically signed by: Oneil Devonshire MD 07/27/2024 11:24 PM EST RP Workstation: [id number]   DG Abd Portable 1V Result Date: 07/27/2024 EXAM: 1 VIEW XRAY OF THE ABDOMEN 07/27/2024 07:59:00 PM COMPARISON: 07/27/2024 CLINICAL HISTORY: The patient has a small bowel obstruction (SBO). ICD10: 881154 - Small bowel obstruction (HCC). FINDINGS: LINES, TUBES AND DEVICES: Partially imaged central venous catheter with tip overlying the RIGHT atrium. If a NG has been placed, evaluation over the chest is  recommended to assess  for coiling. BOWEL: Gas distended small bowel and colon. Contrast is noted within the colon. Persistent small bowel dilatation is noted. SOFT TISSUES: Cholecystectomy clips noted. No abnormal calcifications. BONES: No acute fracture. IMPRESSION: 1. Persistent small bowel dilatation, consistent with small bowel obstruction. 2. No nasogastric catheter is identified, if one has been placed, recommend chest radiograph to assess for coiling. Electronically signed by: Oneil Devonshire MD 07/27/2024 08:04 PM EST RP Workstation: [id number]    Medications / Allergies: per chart  Antibiotics: Anti-infectives (From admission, onward)    Start     Dose/Rate Route Frequency Ordered Stop   07/28/24 0600  cefoTEtan  (CEFOTAN ) 2 g in sodium chloride  0.9 % 100 mL IVPB        2 g 200 mL/hr over 30 Minutes Intravenous On call to O.R. 07/27/24 1410 07/28/24 1227         Note: Portions of this report may have been transcribed using voice recognition software. Every effort was made to ensure accuracy; however, inadvertent computerized transcription errors may be present.   Any transcriptional errors that result from this process are unintentional.    Lauraine Ronnald Nick PA-[facility]  07/29/2024  12:24 PM

## 2024-07-29 NOTE — Progress Notes (Signed)
 " PROGRESS NOTE  Travis Nguyen FMW:[id number] DOB: March 18, 1951 DOA: 07/21/2024 PCP: [name], MD   LOS: 8 days   Brief Narrative / Interim history: 74 year old male with history of multiple myeloma on carfilzomib  as well as daratumumab , HTN, prior prostatectomy, prior SBO's comes into the hospital with small bowel obstruction.  General surgery consulted, he has failed conservative management and will undergo surgery today  Subjective / 24h Interval events: Complains of abdominal pain today, 7/10.  No nausea or vomiting.  No fever or chills  Assesement and Plan: Principal problem Small bowel obstruction-he was admitted to the hospital, NG tube was placed.  He has not improved with conservative management and he was initially taken to the OR on 1/14 and underwent laparoscopic LOA, small bowel resection, appendicectomy - Anticipate a degree of postoperative ileus, continue NG tube, diet advancement per general surgery.  Has not had a bowel movement or passed any gas since yesterday.  Appreciate general surgery follow-up  Blood active problems Sinus bradycardia-stable  Essential hypertension-pressure still on the higher side but better.  Continue to monitor  Multiple myeloma-outpatient management  Normocytic anemia-in the setting of underlying malignancy.  Closely monitor hemoglobin postoperatively  Scheduled Meds:  Chlorhexidine  Gluconate Cloth  6 each Topical Daily   methocarbamol  (ROBAXIN ) injection  500 mg Intravenous Q8H   sodium chloride  flush  10-40 mL Intracatheter Q12H   Continuous Infusions:  acetaminophen  1,000 mg (07/29/24 0649)   lactated ringers      PRN Meds:.acetaminophen , alum & mag hydroxide-simeth, hydrALAZINE , HYDROmorphone  (DILAUDID ) injection, labetalol , lactated ringers , lip balm, magic mouthwash, menthol , ondansetron  **OR** ondansetron  (ZOFRAN ) IV, mouth rinse, phenol, simethicone , sodium chloride  flush  Current Outpatient Medications  Medication  Instructions   acyclovir  (ZOVIRAX ) 400 mg, Oral, 2 times daily   amLODipine  (NORVASC ) 5 mg, Oral, Daily   aspirin EC 81 mg, Oral, Daily, Swallow whole.   lactulose  (CHRONULAC ) 10 g, Oral, Daily PRN   lidocaine -prilocaine  (EMLA ) cream APPLY TOPICALLY TO PORT-A-CATH DAILY AS NEEDED   losartan  (COZAAR ) 100 mg, Oral, Daily   methocarbamol  (ROBAXIN ) 500 mg, Oral, Every 6 hours PRN   oxyCODONE -acetaminophen  (PERCOCET ) 10-325 MG tablet 1 tablet, Oral, 2 times daily PRN   oxyCODONE -acetaminophen  (PERCOCET ) 10-325 MG tablet 1 tablet, Oral, 2 times daily PRN   POMALYST  2 MG capsule TAKE 1 CAPSULE BY MOUTH DAILY  FOR 21 DAYS, THEN 7 DAYS OFF   senna-docusate (SENNA S) 8.6-50 MG tablet 1 tablet, Oral, Daily at bedtime    Diet Orders (From admission, onward)     Start     Ordered   07/28/24 1547  Diet NPO time specified Except for: Ice Chips  Diet effective now       Question:  Except for  Answer:  Ice Chips   07/28/24 1546            DVT prophylaxis: SCDs Start: 07/21/24 9082   Lab Results  Component Value Date   PLT 212 07/28/2024      Code Status: Full Code  Family Communication: No family at bedside  Status is: Inpatient Remains inpatient appropriate because: Severity of illness   Level of care: Med-Surg  Consultants:  General surgery  Objective: Vitals:   07/28/24 1515 07/28/24 2145 07/29/24 0205 07/29/24 0532  BP: (!) 147/74 (!) 154/89 (!) 164/86 (!) 146/73  Pulse: 84 79 72 78  Resp: 17 17 16 16   Temp:  97.8 F (36.6 C) 98.6 F (37 C) 99.2 F (37.3 C)  TempSrc:  Oral Oral Oral  SpO2: 99% 100% 100% 99%  Weight:      Height:        Intake/Output Summary (Last 24 hours) at 07/29/2024 1053 Last data filed at 07/29/2024 0800 Gross per 24 hour  Intake 2149.77 ml  Output 2400 ml  Net -250.23 ml   Wt Readings from Last 3 Encounters:  07/21/24 65.6 kg  07/16/24 65.4 kg  07/07/24 69.9 kg    Examination:  Constitutional: NAD Eyes: lids and conjunctivae  normal, no scleral icterus ENMT: mmm Neck: normal, supple Respiratory: clear to auscultation bilaterally, no wheezing, no crackles. Normal respiratory effort.  Cardiovascular: Regular rate and rhythm, no murmurs / rubs / gallops. No LE edema. Abdomen: soft, no distention, no tenderness. Bowel sounds positive.   Data Reviewed: I have independently reviewed following labs and imaging studies   CBC Recent Labs  Lab 07/24/24 0411 07/25/24 0543 07/26/24 0459 07/27/24 1350 07/28/24 0420  WBC 3.2* 4.1 4.3 5.0 4.9  HGB 10.0* 10.1* 9.1* 8.9* 8.4*  HCT 31.5* 31.5* 28.1* 27.7* 26.8*  PLT 202 243 204 226 212  MCV 105.0* 104.3* 104.5* 102.6* 106.3*  MCH 33.3 33.4 33.8 33.0 33.3  MCHC 31.7 32.1 32.4 32.1 31.3  RDW 14.9 14.6 14.4 14.0 14.0    Recent Labs  Lab 07/23/24 0724 07/24/24 0411 07/25/24 0543 07/26/24 0459 07/28/24 0420  NA 138 139 138 135 135  K 4.4 4.3 3.9 3.6 4.2  CL 106 108 105 102 104  CO2 21* 20* 21* 24 24  GLUCOSE 85 89 109* 90 70  BUN 13 14 12 10  6*  CREATININE 0.76 0.67 0.73 0.66 0.60*  CALCIUM 9.1 8.7* 8.9 8.6* 8.1*  AST 15  --   --  15 17  ALT 13  --   --  12 9  ALKPHOS 65  --   --  58 56  BILITOT 0.6  --   --  0.8 0.5  ALBUMIN 3.8  --   --  3.4* 3.1*    ------------------------------------------------------------------------------------------------------------------ No results for input(s): CHOL, HDL, LDLCALC, TRIG, CHOLHDL, LDLDIRECT in the last 72 hours.  Lab Results  Component Value Date   HGBA1C 5.3 05/18/2016   ------------------------------------------------------------------------------------------------------------------ No results for input(s): TSH, T4TOTAL, T3FREE, THYROIDAB in the last 72 hours.  Invalid input(s): FREET3  Cardiac Enzymes No results for input(s): CKMB, TROPONINI, MYOGLOBIN in the last 168 hours.  Invalid input(s):  CK ------------------------------------------------------------------------------------------------------------------    Component Value Date/Time   BNP 44.1 08/04/2014 1358    CBG: Recent Labs  Lab 07/23/24 2308 07/29/24 0752  GLUCAP 89 62*    No results found for this or any previous visit (from the past 240 hours).   Radiology Studies: No results found.  Nilda Fendt, MD, PhD [facility]  Between 7 am - 7 pm I am available, please contact me via Amion (for emergencies) or Securechat (non urgent messages)  Between 7 pm - 7 am I am not available, please contact night coverage MD/APP via Amion  "

## 2024-07-29 NOTE — Plan of Care (Signed)
   Problem: Coping: Goal: Level of anxiety will decrease Outcome: Progressing   Problem: Pain Managment: Goal: General experience of comfort will improve and/or be controlled Outcome: Progressing

## 2024-07-29 NOTE — Progress Notes (Signed)
 PT Cancellation Note  Patient Details Name: Travis Nguyen MRN: [id number] DOB: 12/11/1950   Cancelled Treatment:    Reason Eval/Treat Not Completed: Patient declined, states that he ambulated with RN. Darice Potters PT Acute Rehabilitation Services Office [phone]   Potters Darice Norris 07/29/2024, 4:06 PM

## 2024-07-30 ENCOUNTER — Inpatient Hospital Stay

## 2024-07-30 ENCOUNTER — Inpatient Hospital Stay: Admitting: Hematology

## 2024-07-30 ENCOUNTER — Inpatient Hospital Stay (HOSPITAL_COMMUNITY)

## 2024-07-30 ENCOUNTER — Ambulatory Visit: Payer: Self-pay | Admitting: Surgery

## 2024-07-30 DIAGNOSIS — K56609 Unspecified intestinal obstruction, unspecified as to partial versus complete obstruction: Secondary | ICD-10-CM | POA: Diagnosis not present

## 2024-07-30 LAB — COMPREHENSIVE METABOLIC PANEL WITH GFR
ALT: 12 U/L (ref 0–44)
AST: 23 U/L (ref 15–41)
Albumin: 3.4 g/dL — ABNORMAL LOW (ref 3.5–5.0)
Alkaline Phosphatase: 64 U/L (ref 38–126)
Anion gap: 14 (ref 5–15)
BUN: 9 mg/dL (ref 8–23)
CO2: 22 mmol/L (ref 22–32)
Calcium: 8.6 mg/dL — ABNORMAL LOW (ref 8.9–10.3)
Chloride: 101 mmol/L (ref 98–111)
Creatinine, Ser: 0.72 mg/dL (ref 0.61–1.24)
GFR, Estimated: >60 mL/min
Glucose, Bld: 73 mg/dL (ref 70–99)
Potassium: 3.9 mmol/L (ref 3.5–5.1)
Sodium: 137 mmol/L (ref 135–145)
Total Bilirubin: 0.7 mg/dL (ref 0.0–1.2)
Total Protein: 6.7 g/dL (ref 6.5–8.1)

## 2024-07-30 LAB — CBC
HCT: 29.1 % — ABNORMAL LOW (ref 39.0–52.0)
Hemoglobin: 9.4 g/dL — ABNORMAL LOW (ref 13.0–17.0)
MCH: 33.7 pg (ref 26.0–34.0)
MCHC: 32.3 g/dL (ref 30.0–36.0)
MCV: 104.3 fL — ABNORMAL HIGH (ref 80.0–100.0)
Platelets: 339 K/uL (ref 150–400)
RBC: 2.79 MIL/uL — ABNORMAL LOW (ref 4.22–5.81)
RDW: 14 % (ref 11.5–15.5)
WBC: 8.5 K/uL (ref 4.0–10.5)
nRBC: 0 % (ref 0.0–0.2)

## 2024-07-30 LAB — PHOSPHORUS: Phosphorus: 2.9 mg/dL (ref 2.5–4.6)

## 2024-07-30 LAB — SURGICAL PATHOLOGY

## 2024-07-30 LAB — MAGNESIUM: Magnesium: 2.1 mg/dL (ref 1.7–2.4)

## 2024-07-30 MED ORDER — ENOXAPARIN SODIUM 40 MG/0.4ML IJ SOSY
40.0000 mg | PREFILLED_SYRINGE | INTRAMUSCULAR | Status: DC
  Administered 2024-07-30 – 2024-08-05 (×7): 40 mg via SUBCUTANEOUS
  Filled 2024-07-30 (×8): qty 0.4

## 2024-07-30 MED ORDER — ACETAMINOPHEN 10 MG/ML IV SOLN
1000.0000 mg | Freq: Four times a day (QID) | INTRAVENOUS | Status: AC
  Administered 2024-07-30 (×3): 1000 mg via INTRAVENOUS
  Filled 2024-07-30 (×4): qty 100

## 2024-07-30 MED ORDER — METHOCARBAMOL 1000 MG/10ML IJ SOLN
1000.0000 mg | Freq: Three times a day (TID) | INTRAMUSCULAR | Status: DC
  Administered 2024-07-30 – 2024-08-01 (×8): 1000 mg via INTRAVENOUS
  Filled 2024-07-30 (×8): qty 10

## 2024-07-30 MED ORDER — KETOROLAC TROMETHAMINE 15 MG/ML IJ SOLN
15.0000 mg | Freq: Three times a day (TID) | INTRAMUSCULAR | Status: AC
  Administered 2024-07-30 (×2): 15 mg via INTRAVENOUS
  Filled 2024-07-30 (×3): qty 1

## 2024-07-30 MED ORDER — DEXTROSE IN LACTATED RINGERS 5 % IV SOLN: INTRAVENOUS | Status: AC

## 2024-07-30 NOTE — TOC Progression Note (Signed)
 Transition of Care [facility]) - Progression Note    Patient Details  Name: Travis Nguyen MRN: [id number] Date of Birth: 01-17-1951  [facility]) CM/SW Contact  Alfonse JONELLE Rex, RN Phone Number: 07/30/2024, 11:05 AM  Clinical Narrative:   patient s/p small bowel resection 1/14. PT eval  pending, await recommendation.                       Expected Discharge Plan and Services                                               Social Drivers of Health (SDOH) Interventions SDOH Screenings   Food Insecurity: No Food Insecurity (07/21/2024)  Housing: Low Risk (07/21/2024)  Transportation Needs: No Transportation Needs (07/21/2024)  Utilities: Not At Risk (07/21/2024)  Depression (PHQ2-9): Low Risk (06/18/2024)  Social Connections: Socially Integrated (07/21/2024)  Tobacco Use: Medium Risk (07/28/2024)    Readmission Risk Interventions     No data to display

## 2024-07-30 NOTE — Progress Notes (Signed)
 SB resection & appendix path benign. We told the pt the good news  Copy of report in room

## 2024-07-30 NOTE — Evaluation (Signed)
 Physical Therapy One Time Evaluation Patient Details Name: Travis Nguyen MRN: [id number] DOB: 12-Sep-1950 Today's Date: 07/30/2024  History of Present Illness  74 year old male with history of multiple myeloma on carfilzomib  as well as daratumumab , HTN, prior prostatectomy, prior SBO's comes into the hospital with small bowel obstruction, S/ PLAPAROSCOPIC LYSIS OF AHDESIONS  SMALL BOWEL RESECTION (ILEUM)  APPENDECTOMY on 07/28/24  Clinical Impression  Patient evaluated by Physical Therapy with no further acute PT needs identified. All education has been completed and the patient has no further questions.  Pt mobilizing well. No follow-up Physical Therapy or equipment needs identified at this time. PT is signing off. Thank you for this referral.         If plan is discharge home, recommend the following:     Can travel by private vehicle        Equipment Recommendations None recommended by PT  Recommendations for Other Services       Functional Status Assessment Patient has not had a recent decline in their functional status     Precautions / Restrictions Precautions Precautions: None      Mobility  Bed Mobility Overal bed mobility: Modified Independent                  Transfers Overall transfer level: Modified independent                      Ambulation/Gait Ambulation/Gait assistance: Supervision, Modified independent (Device/Increase time) Gait Distance (Feet): 400 Feet Assistive device: None Gait Pattern/deviations: WFL(Within Functional Limits), Trunk flexed          Stairs            Wheelchair Mobility     Tilt Bed    Modified Rankin (Stroke Patients Only)       Balance Overall balance assessment:  (denies any recent falls)                                           Pertinent Vitals/Pain Pain Assessment Pain Assessment: 0-10 Pain Score: 7  Pain Location: abdomen Pain Descriptors / Indicators:  Sore Pain Intervention(s): Monitored during session, Repositioned, Patient requesting pain meds-RN notified    Home Living Family/patient expects to be discharged to:: Private residence Living Arrangements: Spouse/significant other             Home Layout: One level Home Equipment: Cane - single point Additional Comments: pt denies any steps or stairs at home    Prior Function Prior Level of Function : Independent/Modified Independent                     Extremity/Trunk Assessment        Lower Extremity Assessment Lower Extremity Assessment: Overall WFL for tasks assessed       Communication   Communication Communication: No apparent difficulties    Cognition Arousal: Alert Behavior During Therapy: WFL for tasks assessed/performed   PT - Cognitive impairments: No apparent impairments                         Following commands: Intact       Cueing       General Comments      Exercises     Assessment/Plan    PT Assessment Patient does not need any further PT services  PT  Problem List         PT Treatment Interventions      PT Goals (Current goals can be found in the Care Plan section)  Acute Rehab PT Goals PT Goal Formulation: All assessment and education complete, DC therapy    Frequency       Co-evaluation               AM-PAC PT 6 Clicks Mobility  Outcome Measure Help needed turning from your back to your side while in a flat bed without using bedrails?: None Help needed moving from lying on your back to sitting on the side of a flat bed without using bedrails?: None Help needed moving to and from a bed to a chair (including a wheelchair)?: None Help needed standing up from a chair using your arms (e.g., wheelchair or bedside chair)?: None Help needed to walk in hospital room?: None Help needed climbing 3-5 steps with a railing? : A Little 6 Click Score: 23    End of Session Equipment Utilized During  Treatment: Gait belt Activity Tolerance: Patient tolerated treatment well Patient left: in bed;with call bell/phone within reach;with family/visitor present Nurse Communication: Mobility status PT Visit Diagnosis: Difficulty in walking, not elsewhere classified (R26.2)    Time: 8770-8761 PT Time Calculation (min) (ACUTE ONLY): 9 min   Charges:   PT Evaluation $PT Eval Low Complexity: 1 Low   PT General Charges $$ ACUTE PT VISIT: 1 Visit       Tari KLEIN, DPT Physical Therapist Acute Rehabilitation Services Office: [phone]   Tari CROME [place] 07/30/2024, 1:34 PM

## 2024-07-30 NOTE — Progress Notes (Signed)
 " PROGRESS NOTE  MISTER Travis Nguyen FMW:[id number] DOB: 1950-12-30 DOA: 07/21/2024 PCP: [name], MD   LOS: 9 days   Brief Narrative / Interim history: 74 year old male with history of multiple myeloma on carfilzomib  as well as daratumumab , HTN, prior prostatectomy, prior SBO's comes into the hospital with small bowel obstruction.  General surgery consulted, he has failed conservative management and will undergo surgery today  Subjective / 24h Interval events: He is still complaining of abdominal pain.  No nausea or vomiting.  Has been up and ambulating  Assesement and Plan: Principal problem Small bowel obstruction-he was admitted to the hospital, NG tube was placed.  He has not improved with conservative management and he was initially taken to the OR on 1/14 and underwent laparoscopic LOA, small bowel resection, appendicectomy - Anticipate a degree of postoperative ileus, continue NG tube, diet advancement per general surgery.  Has not had a bowel movement or passed any gas.  Start IV fluids.  Appreciate general surgery follow-up  Blood active problems Sinus bradycardia-stable  Essential hypertension-pressure still on the higher side but better.  Continue to monitor, continue hydralazine  IV PRN  Multiple myeloma-outpatient management  Normocytic anemia-in the setting of underlying malignancy.  Closely monitor hemoglobin postoperatively  Scheduled Meds:  Chlorhexidine  Gluconate Cloth  6 each Topical Daily   enoxaparin  (LOVENOX ) injection  40 mg Subcutaneous Q24H   ketorolac   15 mg Intravenous Q8H   methocarbamol  (ROBAXIN ) injection  1,000 mg Intravenous Q8H   sodium chloride  flush  10-40 mL Intracatheter Q12H   Continuous Infusions:  acetaminophen  1,000 mg (07/30/24 0913)   PRN Meds:.acetaminophen , alum & mag hydroxide-simeth, hydrALAZINE , HYDROmorphone  (DILAUDID ) injection, labetalol , lip balm, magic mouthwash, menthol , ondansetron  **OR** ondansetron  (ZOFRAN ) IV, mouth rinse,  phenol, simethicone , sodium chloride  flush  Current Outpatient Medications  Medication Instructions   acyclovir  (ZOVIRAX ) 400 mg, Oral, 2 times daily   amLODipine  (NORVASC ) 5 mg, Oral, Daily   aspirin EC 81 mg, Oral, Daily, Swallow whole.   lactulose  (CHRONULAC ) 10 g, Oral, Daily PRN   lidocaine -prilocaine  (EMLA ) cream APPLY TOPICALLY TO PORT-A-CATH DAILY AS NEEDED   losartan  (COZAAR ) 100 mg, Oral, Daily   methocarbamol  (ROBAXIN ) 500 mg, Oral, Every 6 hours PRN   oxyCODONE -acetaminophen  (PERCOCET ) 10-325 MG tablet 1 tablet, Oral, 2 times daily PRN   oxyCODONE -acetaminophen  (PERCOCET ) 10-325 MG tablet 1 tablet, Oral, 2 times daily PRN   POMALYST  2 MG capsule TAKE 1 CAPSULE BY MOUTH DAILY  FOR 21 DAYS, THEN 7 DAYS OFF   senna-docusate (SENNA S) 8.6-50 MG tablet 1 tablet, Oral, Daily at bedtime    Diet Orders (From admission, onward)     Start     Ordered   07/28/24 1547  Diet NPO time specified Except for: Ice Chips  Diet effective now       Question:  Except for  Answer:  Ice Chips   07/28/24 1546            DVT prophylaxis: enoxaparin  (LOVENOX ) injection 40 mg Start: 07/30/24 1000 SCDs Start: 07/21/24 9082   Lab Results  Component Value Date   PLT 339 07/30/2024      Code Status: Full Code  Family Communication: No family at bedside  Status is: Inpatient Remains inpatient appropriate because: Severity of illness   Level of care: Med-Surg  Consultants:  General surgery  Objective: Vitals:   07/29/24 1425 07/29/24 1800 07/29/24 2025 07/30/24 0520  BP: (!) 157/76 (!) 174/97 (!) 157/82 (!) 172/86  Pulse: 69 81 73 93  Resp: 16 16 16 17   Temp: 98.3 F (36.8 C) 98.2 F (36.8 C) 99.2 F (37.3 C) 99.9 F (37.7 C)  TempSrc: Oral Oral Oral Oral  SpO2: 100% 99% 99% 97%  Weight:      Height:        Intake/Output Summary (Last 24 hours) at 07/30/2024 1030 Last data filed at 07/30/2024 0600 Gross per 24 hour  Intake 160 ml  Output 1850 ml  Net -1690 ml   Wt  Readings from Last 3 Encounters:  07/21/24 65.6 kg  07/16/24 65.4 kg  07/07/24 69.9 kg    Examination:  Constitutional: NAD Eyes: lids and conjunctivae normal, no scleral icterus ENMT: mmm Neck: normal, supple Respiratory: clear to auscultation bilaterally, no wheezing, no crackles. Normal respiratory effort.  Cardiovascular: Regular rate and rhythm, no murmurs / rubs / gallops. No LE edema. Abdomen: soft, no distention, no tenderness. Bowel sounds positive.   Data Reviewed: I have independently reviewed following labs and imaging studies   CBC Recent Labs  Lab 07/25/24 0543 07/26/24 0459 07/27/24 1350 07/28/24 0420 07/30/24 0237  WBC 4.1 4.3 5.0 4.9 8.5  HGB 10.1* 9.1* 8.9* 8.4* 9.4*  HCT 31.5* 28.1* 27.7* 26.8* 29.1*  PLT 243 204 226 212 339  MCV 104.3* 104.5* 102.6* 106.3* 104.3*  MCH 33.4 33.8 33.0 33.3 33.7  MCHC 32.1 32.4 32.1 31.3 32.3  RDW 14.6 14.4 14.0 14.0 14.0    Recent Labs  Lab 07/24/24 0411 07/25/24 0543 07/26/24 0459 07/28/24 0420 07/30/24 0237  NA 139 138 135 135 137  K 4.3 3.9 3.6 4.2 3.9  CL 108 105 102 104 101  CO2 20* 21* 24 24 22   GLUCOSE 89 109* 90 70 73  BUN 14 12 10  6* 9  CREATININE 0.67 0.73 0.66 0.60* 0.72  CALCIUM 8.7* 8.9 8.6* 8.1* 8.6*  AST  --   --  15 17 23   ALT  --   --  12 9 12   ALKPHOS  --   --  58 56 64  BILITOT  --   --  0.8 0.5 0.7  ALBUMIN  --   --  3.4* 3.1* 3.4*  MG  --   --   --   --  2.1    ------------------------------------------------------------------------------------------------------------------ No results for input(s): CHOL, HDL, LDLCALC, TRIG, CHOLHDL, LDLDIRECT in the last 72 hours.  Lab Results  Component Value Date   HGBA1C 5.3 05/18/2016   ------------------------------------------------------------------------------------------------------------------ No results for input(s): TSH, T4TOTAL, T3FREE, THYROIDAB in the last 72 hours.  Invalid input(s): FREET3  Cardiac  Enzymes No results for input(s): CKMB, TROPONINI, MYOGLOBIN in the last 168 hours.  Invalid input(s): CK ------------------------------------------------------------------------------------------------------------------    Component Value Date/Time   BNP 44.1 08/04/2014 1358    CBG: Recent Labs  Lab 07/23/24 2308 07/29/24 0752  GLUCAP 89 62*    No results found for this or any previous visit (from the past 240 hours).   Radiology Studies: DG Abd Portable 1V Result Date: 07/30/2024 CLINICAL DATA:  Encounter for imaging study to confirm nasogastric tube placement. EXAM: PORTABLE ABDOMEN - 1 VIEW COMPARISON:  07/28/2024 FINDINGS: Nasogastric tube extends into the upper abdomen and likely in the gastric body region. High-density contrast in the colon. Cholecystectomy clips in the right upper quadrant. Persistent dilated loops of small bowel in the left upper quadrant of the abdomen. IMPRESSION: 1. Nasogastric tube in the upper abdomen, likely in the gastric body region. 2. Persistent dilated loops of small bowel in the left  upper quadrant of the abdomen. Electronically Signed   By: Juliene Balder M.D.   On: 07/30/2024 08:42    Nilda Fendt, MD, PhD [facility]  Between 7 am - 7 pm I am available, please contact me via Amion (for emergencies) or Securechat (non urgent messages)  Between 7 pm - 7 am I am not available, please contact night coverage MD/APP via Amion  "

## 2024-07-30 NOTE — Plan of Care (Signed)

## 2024-07-30 NOTE — Progress Notes (Signed)
 07/30/2024  Travis Nguyen [id number] [birth year]  CARE TEAM: PCP: [name], MD  Outpatient Care Team: Patient Care Team: [name], MD as PCP - General (Family Medicine) [name], MD (Inactive) as Consulting Physician (Urology)  Inpatient Treatment Team: Treatment Team:  Trixie Nilda HERO, MD Ccs, Md, MD Bobbette Pinon, MD Joannie Leader, NT Leron Riis, [place] Ina Charmaine HERO, RN Perri Tinnie LABOR, NT [place], [place] CROME, PT [place], [place], [facility] Lucious Maurilio CROME, RN   Problem List:   Principal Problem:   SBO (small bowel obstruction) [facility]) Active Problems:   Multiple myeloma [facility])   Essential hypertension   Pure hypercholesterolemia   Sinus bradycardia   07/28/2024  Procedures: LAPAROSCOPY, DIAGNOSTIC, lysis of adhesions, small bowel resection, APPENDECTOMY, laparoscopic    Assessment [facility] Stay = 9 days) 2 Days Post-Op    Patient is recovering relatively well abdomen is non distended and firm at suture sites only.  Hypertensive urgency likely multifactorial due to h/x hypertension and current pain. Will readjust pain management plan.  CBC reviewed relative anemia but hemoglobin is increasing from 8.4 on 07/28/24 to 9.4 on 07/30/24.   Creatinine has normalized. Will continue to monitor renal function for nephrotoxicity.  Plan:  Pain Management  - Initiate treatment with Toradol  15 MG Q 8 hours prn for pain.  - Continue Hydromorphone  prn for pain  - Reinitiate treatment with IV acetaminophen  1,000 mg Q6  hours prn for pain.  DVT/VTE Prophylaxis - Initiate treatment with Enoxaparin  40 mg Q 24 hours - VTE prophylaxis - SCDs.   - Continue to monitor renal function for nephrotoxicity.  - Continue to monitor electrolytes & replace as needed  Keep K>4, Mg>2, Phos>3   - Mobilize as tolerated to help recovery.  Enlist therapies in moderate/high risk patients as appropriate    -Disposition:  1-3 days depending on patient  recovery.   I reviewed last 24 h vitals and pain scores, last 48 h intake and output, last 24 h labs and trends, and last 24 h imaging results.  I have reviewed this patient's available data, including medical history, events of note, test results, etc as part of my evaluation.   A significant portion of that time was spent in counseling. Care during the described time interval was provided by me.  This care required moderate level of medical decision making.  07/30/2024    Subjective: (Chief complaint)  Patient stated pain throughout the night was severe and that currently his pain is a 10/10. BP has remained elevated. PRN pain meds with little relief.   Reports dysuria, denies any hematuria.   Denies N/V, flatus, BM or hiccups.  NG tube fell out this morning, nurse is replacing tube.   Objective:  Vital signs:  Vitals:   07/29/24 1425 07/29/24 1800 07/29/24 2025 07/30/24 0520  BP: (!) 157/76 (!) 174/97 (!) 157/82 (!) 172/86  Pulse: 69 81 73 93  Resp: 16 16 16 17   Temp: 98.3 F (36.8 C) 98.2 F (36.8 C) 99.2 F (37.3 C) 99.9 F (37.7 C)  TempSrc: Oral Oral Oral Oral  SpO2: 100% 99% 99% 97%  Weight:      Height:        Last BM Date : 07/24/24  Intake/Output   Yesterday:  01/15 0701 - 01/16 0700 In: 281 [P.O.:60; IV Piggyback:221] Out: 2100 [Urine:950; Emesis/NG output:1150] This shift:  No intake/output data recorded.  Bowel function:  Flatus: No  BM:  No  Drain: (No drain)   Physical Exam:  General: Pt awake/alert in moderate acute distress Eyes: PERRL, normal EOM.  Sclera clear.  No icterus Neuro: CN II-XII intact w/o focal sensory/motor deficits. Lymph: No head/neck/groin lymphadenopathy Psych:  No delerium/psychosis/paranoia.  Oriented x 4 HENT: Normocephalic, Mucus membranes moist.  No thrush Neck: Supple, No tracheal deviation.  No obvious thyromegaly Chest: No pain to chest wall compression.  Good respiratory excursion.  No audible  wheezing CV:  Pulses intact.  Regular rhythm.  No major extremity edema MS: Normal AROM mjr joints.  No obvious deformity  Abdomen: Soft.  Nondistended.  Mildly tender at incisions only.  No evidence of peritonitis.  No incarcerated hernias.  Ext:   No deformity.  No mjr edema.  No cyanosis Skin: No petechiae / purpurea.  No major sores.  Warm and dry    Results:   Cultures: No results found for this or any previous visit (from the past 720 hours).  Labs: Results for orders placed or performed during the hospital encounter of 07/21/24 (from the past 48 hours)  [facility] and screen  [facility]     Status: None (Preliminary result)   Collection Time: 07/28/24 10:30 AM  Result Value Ref Range   ABO/RH(D) B NEG    Antibody Screen POS    Sample Expiration 07/31/2024,2359    Antibody Identification NON SPECIFIC ANTIBODY REACTIVITY    Weak D POS    Unit Number [id number]    Blood Component Type RED CELLS,LR    Unit division 00    Status of Unit ALLOCATED    Transfusion Status OK TO TRANSFUSE    Crossmatch Result      COMPATIBLE Performed at [facility], [address] [place], [place] [address]    Unit Number [id number]    Blood Component Type RED CELLS,LR    Unit division 00    Status of Unit ALLOCATED    Transfusion Status OK TO TRANSFUSE    Crossmatch Result COMPATIBLE   Glucose, capillary     Status: Abnormal   Collection Time: 07/29/24  7:52 AM  Result Value Ref Range   Glucose-Capillary 62 (L) 70 - 99 mg/dL    Comment: Glucose reference range applies only to samples taken after fasting for at least 8 hours.  Comprehensive metabolic panel with GFR     Status: Abnormal   Collection Time: 07/30/24  2:37 AM  Result Value Ref Range   Sodium 137 135 - 145 mmol/L   Potassium 3.9 3.5 - 5.1 mmol/L   Chloride 101 98 - 111 mmol/L   CO2 22 22 - 32 mmol/L   Glucose, Bld 73 70 - 99 mg/dL    Comment: Glucose reference range applies only  to samples taken after fasting for at least 8 hours.   BUN 9 8 - 23 mg/dL   Creatinine, Ser 9.27 0.61 - 1.24 mg/dL   Calcium 8.6 (L) 8.9 - 10.3 mg/dL   Total Protein 6.7 6.5 - 8.1 g/dL   Albumin 3.4 (L) 3.5 - 5.0 g/dL   AST 23 15 - 41 U/L   ALT 12 0 - 44 U/L   Alkaline Phosphatase 64 38 - 126 U/L   Total Bilirubin 0.7 0.0 - 1.2 mg/dL   GFR, Estimated >39 >39 mL/min    Comment: (NOTE) Calculated using the CKD-EPI Creatinine Equation (2021)    Anion gap 14 5 - 15    Comment: Performed at [facility], [address] [place], [place] [address]  CBC  Status: Abnormal   Collection Time: 07/30/24  2:37 AM  Result Value Ref Range   WBC 8.5 4.0 - 10.5 K/uL   RBC 2.79 (L) 4.22 - 5.81 MIL/uL   Hemoglobin 9.4 (L) 13.0 - 17.0 g/dL   HCT 70.8 (L) 60.9 - 47.9 %   MCV 104.3 (H) 80.0 - 100.0 fL   MCH 33.7 26.0 - 34.0 pg   MCHC 32.3 30.0 - 36.0 g/dL   RDW 85.9 88.4 - 84.4 %   Platelets 339 150 - 400 K/uL   nRBC 0.0 0.0 - 0.2 %    Comment: Performed at [facility], [address] [place], [place] [address]  Phosphorus     Status: None   Collection Time: 07/30/24  2:37 AM  Result Value Ref Range   Phosphorus 2.9 2.5 - 4.6 mg/dL    Comment: Performed at [facility], [address] [place], [place] [address]  Magnesium     Status: None   Collection Time: 07/30/24  2:37 AM  Result Value Ref Range   Magnesium 2.1 1.7 - 2.4 mg/dL    Comment: Performed at [facility], [address] [place], [place] [address]   *Note: Due to a large number of results and/or encounters for the requested time period, some results have not been displayed. A complete set of results can be found in Results Review.    Imaging / Studies: No results found.   Medications / Allergies: per chart  Antibiotics: Anti-infectives (From admission, onward)    Start     Dose/Rate Route Frequency Ordered Stop   07/28/24 0600  cefoTEtan   (CEFOTAN ) 2 g in sodium chloride  0.9 % 100 mL IVPB        2 g 200 mL/hr over 30 Minutes Intravenous On call to O.R. 07/27/24 1410 07/28/24 1227         Note: Portions of this report may have been transcribed using voice recognition software. Every effort was made to ensure accuracy; however, inadvertent computerized transcription errors may be present.   Any transcriptional errors that result from this process are unintentional.    Lauraine Ronnald Nick PA-Student [facility]  07/30/2024  7:34 AM

## 2024-07-31 ENCOUNTER — Other Ambulatory Visit: Payer: Self-pay

## 2024-07-31 DIAGNOSIS — K56609 Unspecified intestinal obstruction, unspecified as to partial versus complete obstruction: Secondary | ICD-10-CM | POA: Diagnosis not present

## 2024-07-31 LAB — CBC
HCT: 26.6 % — ABNORMAL LOW (ref 39.0–52.0)
Hemoglobin: 8.6 g/dL — ABNORMAL LOW (ref 13.0–17.0)
MCH: 33.3 pg (ref 26.0–34.0)
MCHC: 32.3 g/dL (ref 30.0–36.0)
MCV: 103.1 fL — ABNORMAL HIGH (ref 80.0–100.0)
Platelets: 346 K/uL (ref 150–400)
RBC: 2.58 MIL/uL — ABNORMAL LOW (ref 4.22–5.81)
RDW: 14.1 % (ref 11.5–15.5)
WBC: 5.4 K/uL (ref 4.0–10.5)
nRBC: 0 % (ref 0.0–0.2)

## 2024-07-31 LAB — PHOSPHORUS: Phosphorus: 3.1 mg/dL (ref 2.5–4.6)

## 2024-07-31 LAB — BASIC METABOLIC PANEL WITH GFR
Anion gap: 7 (ref 5–15)
BUN: 16 mg/dL (ref 8–23)
CO2: 28 mmol/L (ref 22–32)
Calcium: 8.6 mg/dL — ABNORMAL LOW (ref 8.9–10.3)
Chloride: 101 mmol/L (ref 98–111)
Creatinine, Ser: 0.9 mg/dL (ref 0.61–1.24)
GFR, Estimated: >60 mL/min
Glucose, Bld: 122 mg/dL — ABNORMAL HIGH (ref 70–99)
Potassium: 3.8 mmol/L (ref 3.5–5.1)
Sodium: 136 mmol/L (ref 135–145)

## 2024-07-31 LAB — GLUCOSE, CAPILLARY
Glucose-Capillary: 100 mg/dL — ABNORMAL HIGH (ref 70–99)
Glucose-Capillary: 109 mg/dL — ABNORMAL HIGH (ref 70–99)
Glucose-Capillary: 111 mg/dL — ABNORMAL HIGH (ref 70–99)
Glucose-Capillary: 88 mg/dL (ref 70–99)

## 2024-07-31 LAB — MAGNESIUM: Magnesium: 2.1 mg/dL (ref 1.7–2.4)

## 2024-07-31 MED ORDER — INSULIN ASPART 100 UNIT/ML IJ SOLN
0.0000 [IU] | INTRAMUSCULAR | Status: DC
  Administered 2024-08-02 (×2): 2 [IU] via SUBCUTANEOUS
  Filled 2024-07-31 (×2): qty 2

## 2024-07-31 MED ORDER — THIAMINE HCL 100 MG/ML IJ SOLN
100.0000 mg | INTRAMUSCULAR | Status: DC
  Administered 2024-08-01 – 2024-08-05 (×5): 100 mg via INTRAVENOUS
  Filled 2024-07-31 (×5): qty 2

## 2024-07-31 MED ORDER — THIAMINE HCL 100 MG/ML IJ SOLN
100.0000 mg | Freq: Once | INTRAMUSCULAR | Status: AC
  Administered 2024-07-31: 100 mg via INTRAVENOUS
  Filled 2024-07-31: qty 2

## 2024-07-31 MED ORDER — TRAVASOL 10 % IV SOLN
INTRAVENOUS | Status: AC
  Filled 2024-07-31: qty 480

## 2024-07-31 MED ORDER — LACTATED RINGERS IV SOLN: INTRAVENOUS | Status: DC

## 2024-07-31 MED ORDER — INSULIN ASPART 100 UNIT/ML IJ SOLN: 0.0000 [IU] | INTRAMUSCULAR | Status: DC

## 2024-07-31 NOTE — Progress Notes (Signed)
 Pt refusing NGT re-insertion. Debby, MD aware.

## 2024-07-31 NOTE — Progress Notes (Addendum)
 PHARMACY - TOTAL PARENTERAL NUTRITION CONSULT NOTE   Indication: Prolonged ileus  Patient Measurements: Height: 6' 1 (185.4 cm) Weight: 65.6 kg (144 lb 11.2 oz) IBW/kg (Calculated) : 79.9 TPN AdjBW (KG): 65.6 Body mass index is 19.09 kg/m. Usual Weight:   Assessment: Patient is a 73yoM presented on 1/7 with recurrent SBO on CT and later concerns for appendicitis. PMH of multiple myeloma, prostatectomy on pomalyst  (on hold), SBO (usually self-resolves per patient). Patient is s/p ex-lap LOA, small bowel resection, appendectomy on 1/14. No BM or flatulence since surg. Pharmacy consulted for TPN management in setting of post-op prolonged ileus.   Glucose / Insulin : No hx DM, not currently on insulin   -noted few episodes of hypoglycemic (60-70s) on 1/15 -CBG goal: 140-180 Electrolytes: lytes WNL, including CorrCa (9.1), Phos 3.1 Renal: Scr <1/WNL, BUN up to 16 Hepatic: LFTs, Tbili WNL Intake / Output; MIVF:  - PO intake: 300 mL - UOP: 1000 mL, 1 occurrence - last BM:  - mIVF: D5LR @75  ml/hr  - NGT (placed 1/7, replaced 1//16): no output recorded  GI Imaging: 1/7 CT a/p: findings consistent with L pelvic adhesive SBO 1/7 AXR: Persistent multiple dilated loops of small bowel.  1/9 AXR: Multiple gas-filled dilated loops of small bowel within the central abdomen, consistent with small bowel obstruction 1/13 AXR: persistent SBO 1/14 AXR: high-grade partial SBO, colonic diverticulosis   GI Surgeries / Procedures:  1/14: ex-lap LOA, small bowel resection, appendectomy  Central access: pending PICC   TPN start date: 1/17, pending PICC placement   Nutritional Goals: pending RD assessment  Goal TPN rate is - mL/hr (provides - g of protein and - kcals per day)  RD Assessment: pending    Current Nutrition:  NPO  Plan:  Start TPN at 87mL/hr at 1800 and assess tolerance  Provides 48g protein, total kcal 921.6; pending RD assessment  Electrolytes in TPN: (standard) Na 86mEq/L, K  63mEq/L, Ca 77mEq/L, Mg 19mEq/L, and Phos 15mmol/L Cl:Ac 1:1 Add standard MVI and trace elements to TPN Initiate Moderate q4h SSI and adjust as needed  Thiamine  100mg  IV daily x 7 days (thru 1/24) per RD  Reduce MIVF to 35 mL/hr at 1800 (remove D5 from IVF) Monitor TPN labs on Mon/Thurs, CMP/Mg/Phos tomorrow with AM labs   Travis Nguyen 07/31/2024,8:43 AM

## 2024-07-31 NOTE — Progress Notes (Signed)
 Initial Nutrition Assessment  DOCUMENTATION CODES:  Not applicable  INTERVENTION:  TPN management per pharmacy Monitor magnesium, potassium, and phosphorus daily for at least 3 days, MD to replete as needed, as pt is at risk for refeeding syndrome given NPO/CLD >7 days. Add Thiamine  100 mg daily for 7 days Recommend obtaining new weight given no weight since admission  NUTRITION DIAGNOSIS:  Inadequate oral intake related to inability to eat as evidenced by NPO status.  GOAL:  Patient will meet greater than or equal to 90% of their needs  MONITOR:  Diet advancement, Labs, I & O's, Weight trends  REASON FOR ASSESSMENT:  Consult New TPN/TNA  ASSESSMENT:  74 y.o. male presented to the ED with abdominal pain and constipation. PMH includes prostate cancer, DVT, depression, HTN, multiple myeloma, small bowel obstruction, and HLD. Pt admitted with small bowel obstruction.  1/07 - Admitted; NGT placed to LIWS 1/10 - NGT removed; diet advanced to clear liquids 1/12 - diet advanced to full liquids; downgraded back to clear liquids 1/13 - NPO; NGT replaced to LIWS 1/14 - OP s/p laparoscopic lysis of adhesions, small bowel resection (7.5 cm), appendectomy 1/16 - NGT removed 1/17 - initiation of TPN   RD working remotely at time of assessment. Pt unavailable at time of RD call to patient room. Plan to initiate TPN today given mostly NPO/Clear liquids for 10 days. Discussed adding thiamine  with pharmacist given patient is at high risk for refeeding due to minimal nutrition during this admission.   Admission/Current Weight: 65.6 kg Per chart review, patients weight has remained relatively stable over the past year.    Nutrition Related Medications: Novolog  0-15 units q4h Labs: Sodium 136, Potassium 3.8, BUN 16, Creatinine 0.90, Phosphorus 3.1, Magnesium 2.1   NUTRITION - FOCUSED PHYSICAL EXAM: Deferred.  Diet Order:   Diet Order             Diet NPO time specified Except for: Ice  Chips  Diet effective now                  EDUCATION NEEDS:  Not appropriate for education at this time  Skin:  Skin Assessment: Reviewed RN Assessment  Last BM:  1/10  Height:  Ht Readings from Last 1 Encounters:  07/21/24 6' 1 (1.854 m)   Weight:  Wt Readings from Last 1 Encounters:  07/21/24 65.6 kg   Ideal Body Weight:  83.6 kg  BMI:  Body mass index is 19.09 kg/m.  Estimated Nutritional Needs:  Kcal:  2100-2300 Protein:  100-120 grams Fluid:  >/= 2 L   Nestora Glatter RD, LDN Registered Dietitian I Please see AMION for contact information

## 2024-07-31 NOTE — Progress Notes (Signed)
 07/31/2024  Travis Nguyen [id number] [birth year]  CARE TEAM: PCP: [name], MD  Outpatient Care Team: Patient Care Team: [name], MD as PCP - General (Family Medicine) [name], MD (Inactive) as Consulting Physician (Urology)  Inpatient Treatment Team: Treatment Team:  Trixie Nilda HERO, MD Ccs, Md, MD Bobbette Pinon, MD Joannie Leader, [place] Perri Tinnie LABOR, NT Lucious Maurilio CROME, RN   Problem List:   Principal Problem:   SBO (small bowel obstruction) [facility]) Active Problems:   Multiple myeloma [facility])   Essential hypertension   Pure hypercholesterolemia   Sinus bradycardia   07/28/2024  Procedures: LAPAROSCOPY, DIAGNOSTIC, lysis of adhesions, small bowel resection, APPENDECTOMY, laparoscopic    Assessment [facility] Stay = 10 days) 3 Days Post-Op    Patient stated his pain is inadequately controlled with prn pain meds. Moderate abdominal distention.  Hypertensive urgency likely multifactorial due to h/x hypertension and current pain.       Plan:  Diet  - Modify diet from NPO + chips to Tube feed nutrition due to failure to improve.  Pain Management  - Advised patient NG tube is best way to control pain.  - Continue Toradol  15 MG Q 8 hours prn for pain.  - Continue Hydromorphone  prn for pain  - Continue IV acetaminophen  1,000 mg Q6  hours prn for pain.  DVT/VTE Prophylaxis - Continue Enoxaparin  40 mg Q 24 hours - VTE prophylaxis - SCDs.   - Continue to monitor renal function for nephrotoxicity.  - Continue to monitor electrolytes & replace as needed  Keep K>4, Mg>2, Phos>3  - Continue to monitor HGB; Decreased from 9.4 on 07/30/2024 to 8.6 on 07/31/2024   - Mobilize as tolerated to help recovery.  Enlist therapies in moderate/high risk patients as appropriate    -Disposition:  1-3 days depending on patient recovery.   I reviewed last 24 h vitals and pain scores, last 48 h intake and output, last 24 h labs and trends, and  last 24 h imaging results.  I have reviewed this patient's available data, including medical history, events of note, test results, etc as part of my evaluation.   A significant portion of that time was spent in counseling. Care during the described time interval was provided by me.  This care required moderate level of medical decision making.  07/31/2024    Subjective: (Chief complaint)  Patient stated pain has remained persistent and is inadequately controlled with PRN pain medication. BP has remained elevated.   Reports dysuria, denies any hematuria.   Reports persistent burping. Denies flatus, BM, N/V    Objective:  Vital signs:  Vitals:   07/30/24 0520 07/30/24 1316 07/30/24 2139 07/31/24 0512  BP: (!) 172/86 (!) 155/78 (!) 161/97 (!) 159/90  Pulse: 93 78 75 79  Resp: 17 18 18 17   Temp: 99.9 F (37.7 C) 98.5 F (36.9 C) 98.3 F (36.8 C) 98 F (36.7 C)  TempSrc: Oral Oral Oral   SpO2: 97% 97% 100% 100%  Weight:      Height:        Last BM Date : 07/24/24  Intake/Output   Yesterday:  01/16 0701 - 01/17 0700 In: 1844.7 [P.O.:300; I.V.:1244.7; IV Piggyback:300] Out: 1000 [Urine:1000] This shift:  No intake/output data recorded.  Bowel function:  Flatus: No  BM:  No  Drain: (No drain)   Physical Exam:  General: Pt awake/alert in moderate acute distress Eyes: PERRL, normal EOM.  Sclera clear.  No icterus Neuro: CN II-XII intact w/o  focal sensory/motor deficits. Lymph: No head/neck/groin lymphadenopathy Psych:  No delerium/psychosis/paranoia.  Oriented x 4 HENT: Normocephalic, Mucus membranes moist.  No thrush Neck: Supple, No tracheal deviation.  No obvious thyromegaly Chest: No pain to chest wall compression.  Good respiratory excursion.  No audible wheezing CV:  Pulses intact.  Regular rhythm.  No major extremity edema MS: Normal AROM mjr joints.  No obvious deformity  Abdomen: Moderately distended, tender at midline superior to umbilicus.  No  rebound tenderness or guarding.  No incarcerated hernias.  Ext:   No deformity.  No mjr edema.  No cyanosis Skin: No petechiae / purpurea.  No major sores.  Warm and dry    Results:   Cultures: No results found for this or any previous visit (from the past 720 hours).  Labs: Results for orders placed or performed during the hospital encounter of 07/21/24 (from the past 48 hours)  Comprehensive metabolic panel with GFR     Status: Abnormal   Collection Time: 07/30/24  2:37 AM  Result Value Ref Range   Sodium 137 135 - 145 mmol/L   Potassium 3.9 3.5 - 5.1 mmol/L   Chloride 101 98 - 111 mmol/L   CO2 22 22 - 32 mmol/L   Glucose, Bld 73 70 - 99 mg/dL    Comment: Glucose reference range applies only to samples taken after fasting for at least 8 hours.   BUN 9 8 - 23 mg/dL   Creatinine, Ser 9.27 0.61 - 1.24 mg/dL   Calcium 8.6 (L) 8.9 - 10.3 mg/dL   Total Protein 6.7 6.5 - 8.1 g/dL   Albumin 3.4 (L) 3.5 - 5.0 g/dL   AST 23 15 - 41 U/L   ALT 12 0 - 44 U/L   Alkaline Phosphatase 64 38 - 126 U/L   Total Bilirubin 0.7 0.0 - 1.2 mg/dL   GFR, Estimated >39 >39 mL/min    Comment: (NOTE) Calculated using the CKD-EPI Creatinine Equation (2021)    Anion gap 14 5 - 15    Comment: Performed at [facility], [address], [place], [place] [address]  CBC     Status: Abnormal   Collection Time: 07/30/24  2:37 AM  Result Value Ref Range   WBC 8.5 4.0 - 10.5 K/uL   RBC 2.79 (L) 4.22 - 5.81 MIL/uL   Hemoglobin 9.4 (L) 13.0 - 17.0 g/dL   HCT 70.8 (L) 60.9 - 47.9 %   MCV 104.3 (H) 80.0 - 100.0 fL   MCH 33.7 26.0 - 34.0 pg   MCHC 32.3 30.0 - 36.0 g/dL   RDW 85.9 88.4 - 84.4 %   Platelets 339 150 - 400 K/uL   nRBC 0.0 0.0 - 0.2 %    Comment: Performed at [facility], [address], [place], [place] [address]  Phosphorus     Status: None   Collection Time: 07/30/24  2:37 AM  Result Value Ref Range   Phosphorus 2.9 2.5 - 4.6 mg/dL    Comment:  Performed at [facility], [address], [place], [place] [address]  Magnesium     Status: None   Collection Time: 07/30/24  2:37 AM  Result Value Ref Range   Magnesium 2.1 1.7 - 2.4 mg/dL    Comment: Performed at [facility], [address], [place], [place] [address]  Basic metabolic panel with GFR     Status: Abnormal   Collection Time: 07/31/24  2:23 AM  Result  Value Ref Range   Sodium 136 135 - 145 mmol/L   Potassium 3.8 3.5 - 5.1 mmol/L   Chloride 101 98 - 111 mmol/L   CO2 28 22 - 32 mmol/L   Glucose, Bld 122 (H) 70 - 99 mg/dL    Comment: Glucose reference range applies only to samples taken after fasting for at least 8 hours.   BUN 16 8 - 23 mg/dL   Creatinine, Ser 9.09 0.61 - 1.24 mg/dL   Calcium 8.6 (L) 8.9 - 10.3 mg/dL   GFR, Estimated >39 >39 mL/min    Comment: (NOTE) Calculated using the CKD-EPI Creatinine Equation (2021)    Anion gap 7 5 - 15    Comment: Performed at [facility], [address] [place], [place] [address]  CBC     Status: Abnormal   Collection Time: 07/31/24  2:23 AM  Result Value Ref Range   WBC 5.4 4.0 - 10.5 K/uL   RBC 2.58 (L) 4.22 - 5.81 MIL/uL   Hemoglobin 8.6 (L) 13.0 - 17.0 g/dL   HCT 73.3 (L) 60.9 - 47.9 %   MCV 103.1 (H) 80.0 - 100.0 fL   MCH 33.3 26.0 - 34.0 pg   MCHC 32.3 30.0 - 36.0 g/dL   RDW 85.8 88.4 - 84.4 %   Platelets 346 150 - 400 K/uL   nRBC 0.0 0.0 - 0.2 %    Comment: Performed at [facility], [address], [place], [place] [address]  Magnesium     Status: None   Collection Time: 07/31/24  2:23 AM  Result Value Ref Range   Magnesium 2.1 1.7 - 2.4 mg/dL    Comment: Performed at [facility], [address] [place], [place] [address]  Phosphorus     Status: None   Collection Time: 07/31/24  2:23 AM  Result Value Ref Range   Phosphorus 3.1 2.5 - 4.6 mg/dL    Comment: Performed at [facility], [address], [place], [place] [address]   *Note: Due to a large number of results and/or encounters for the requested time period, some results have not been displayed. A complete set of results can be found in Results Review.    Imaging / Studies: DG Abd Portable 1V Result Date: 07/30/2024 CLINICAL DATA:  Encounter for imaging study to confirm nasogastric tube placement. EXAM: PORTABLE ABDOMEN - 1 VIEW COMPARISON:  07/28/2024 FINDINGS: Nasogastric tube extends into the upper abdomen and likely in the gastric body region. High-density contrast in the colon. Cholecystectomy clips in the right upper quadrant. Persistent dilated loops of small bowel in the left upper quadrant of the abdomen. IMPRESSION: 1. Nasogastric tube in the upper abdomen, likely in the gastric body region. 2. Persistent dilated loops of small bowel in the left upper quadrant of the abdomen. Electronically Signed   By: Juliene Balder M.D.   On: 07/30/2024 08:42     Medications / Allergies: per chart  Antibiotics: Anti-infectives (From admission, onward)    Start     Dose/Rate Route Frequency Ordered Stop   07/28/24 0600  cefoTEtan  (CEFOTAN ) 2 g in sodium chloride  0.9 % 100 mL IVPB        2 g 200 mL/hr over 30 Minutes Intravenous On call to O.R. 07/27/24 1410 07/28/24 1227         Note: Portions of this report may have been transcribed using voice recognition software. Every effort was made to ensure accuracy; however, inadvertent  computerized transcription errors may be present.   Any transcriptional errors that result from this process are unintentional.    Lauraine Ronnald Nick PA-Student [facility]  07/31/2024  8:13 AM

## 2024-07-31 NOTE — Progress Notes (Signed)
 " PROGRESS NOTE  Travis Nguyen FMW:[id number] DOB: [birth year] DOA: 07/21/2024 PCP: [name], MD   LOS: 10 days   Brief Narrative / Interim history: 74 year old male with history of multiple myeloma on carfilzomib  as well as daratumumab , HTN, prior prostatectomy, prior SBO's comes into the hospital with small bowel obstruction.  General surgery consulted, he has failed conservative management and will undergo surgery today  Subjective / 24h Interval events: NG tube came out last night, has not been replaced.  He denies any nausea or vomiting.  May have been passing some gas, he is not sure, but definitely no BMs  Assesement and Plan: Principal problem Small bowel obstruction-he was admitted to the hospital, NG tube was placed.  He has not improved with conservative management and he was taken to the OR on 1/14 and underwent laparoscopic LOA, small bowel resection, appendicectomy - Anticipate a degree of postoperative ileus, NG tube discontinued yesterday by accident, was not replaced.  Surgery evaluated patient this morning, started TPN  Blood active problems Sinus bradycardia-stable  Essential hypertension-pressure still on the higher side but better.  Blood pressure is stable  Multiple myeloma-outpatient management  Normocytic anemia-in the setting of underlying malignancy.  Closely monitor hemoglobin postoperatively, overall stable, 8.6 today  Scheduled Meds:  Chlorhexidine  Gluconate Cloth  6 each Topical Daily   enoxaparin  (LOVENOX ) injection  40 mg Subcutaneous Q24H   insulin  aspart  0-15 Units Subcutaneous Q4H   methocarbamol  (ROBAXIN ) injection  1,000 mg Intravenous Q8H   sodium chloride  flush  10-40 mL Intracatheter Q12H   thiamine  (VITAMIN B1) injection  100 mg Intravenous Once   Followed by   [START ON 08/01/2024] thiamine  (VITAMIN B1) injection  100 mg Intravenous Q24H   Continuous Infusions:  dextrose  5% lactated ringers  75 mL/hr at 07/30/24 1127   lactated  ringers      TPN ADULT (ION)     PRN Meds:.acetaminophen , alum & mag hydroxide-simeth, hydrALAZINE , HYDROmorphone  (DILAUDID ) injection, labetalol , lip balm, magic mouthwash, menthol , ondansetron  **OR** ondansetron  (ZOFRAN ) IV, mouth rinse, phenol, simethicone , sodium chloride  flush  Current Outpatient Medications  Medication Instructions   acyclovir  (ZOVIRAX ) 400 mg, Oral, 2 times daily   amLODipine  (NORVASC ) 5 mg, Oral, Daily   aspirin EC 81 mg, Oral, Daily, Swallow whole.   lactulose  (CHRONULAC ) 10 g, Oral, Daily PRN   lidocaine -prilocaine  (EMLA ) cream APPLY TOPICALLY TO PORT-A-CATH DAILY AS NEEDED   losartan  (COZAAR ) 100 mg, Oral, Daily   methocarbamol  (ROBAXIN ) 500 mg, Oral, Every 6 hours PRN   oxyCODONE -acetaminophen  (PERCOCET ) 10-325 MG tablet 1 tablet, Oral, 2 times daily PRN   oxyCODONE -acetaminophen  (PERCOCET ) 10-325 MG tablet 1 tablet, Oral, 2 times daily PRN   POMALYST  2 MG capsule TAKE 1 CAPSULE BY MOUTH DAILY  FOR 21 DAYS, THEN 7 DAYS OFF   senna-docusate (SENNA S) 8.6-50 MG tablet 1 tablet, Oral, Daily at bedtime    Diet Orders (From admission, onward)     Start     Ordered   07/28/24 1547  Diet NPO time specified Except for: Ice Chips  Diet effective now       Question:  Except for  Answer:  Ice Chips   07/28/24 1546            DVT prophylaxis: enoxaparin  (LOVENOX ) injection 40 mg Start: 07/30/24 1000 SCDs Start: 07/21/24 9082   Lab Results  Component Value Date   PLT 346 07/31/2024      Code Status: Full Code  Family Communication: No family at bedside  Status  is: Inpatient Remains inpatient appropriate because: Severity of illness   Level of care: Med-Surg  Consultants:  General surgery  Objective: Vitals:   07/30/24 0520 07/30/24 1316 07/30/24 2139 07/31/24 0512  BP: (!) 172/86 (!) 155/78 (!) 161/97 (!) 159/90  Pulse: 93 78 75 79  Resp: 17 18 18 17   Temp: 99.9 F (37.7 C) 98.5 F (36.9 C) 98.3 F (36.8 C) 98 F (36.7 C)  TempSrc: Oral  Oral Oral   SpO2: 97% 97% 100% 100%  Weight:      Height:        Intake/Output Summary (Last 24 hours) at 07/31/2024 0959 Last data filed at 07/31/2024 0930 Gross per 24 hour  Intake 1844.68 ml  Output 1200 ml  Net 644.68 ml   Wt Readings from Last 3 Encounters:  07/21/24 65.6 kg  07/16/24 65.4 kg  07/07/24 69.9 kg    Examination:  Constitutional: NAD Eyes: lids and conjunctivae normal, no scleral icterus ENMT: mmm Neck: normal, supple Respiratory: clear to auscultation bilaterally, no wheezing, no crackles. Normal respiratory effort.  Cardiovascular: Regular rate and rhythm, no murmurs / rubs / gallops. No LE edema. Abdomen: soft, no distention, no tenderness. Bowel sounds positive.   Data Reviewed: I have independently reviewed following labs and imaging studies   CBC Recent Labs  Lab 07/26/24 0459 07/27/24 1350 07/28/24 0420 07/30/24 0237 07/31/24 0223  WBC 4.3 5.0 4.9 8.5 5.4  HGB 9.1* 8.9* 8.4* 9.4* 8.6*  HCT 28.1* 27.7* 26.8* 29.1* 26.6*  PLT 204 226 212 339 346  MCV 104.5* 102.6* 106.3* 104.3* 103.1*  MCH 33.8 33.0 33.3 33.7 33.3  MCHC 32.4 32.1 31.3 32.3 32.3  RDW 14.4 14.0 14.0 14.0 14.1    Recent Labs  Lab 07/25/24 0543 07/26/24 0459 07/28/24 0420 07/30/24 0237 07/31/24 0223  NA 138 135 135 137 136  K 3.9 3.6 4.2 3.9 3.8  CL 105 102 104 101 101  CO2 21* 24 24 22 28   GLUCOSE 109* 90 70 73 122*  BUN 12 10 6* 9 16  CREATININE 0.73 0.66 0.60* 0.72 0.90  CALCIUM 8.9 8.6* 8.1* 8.6* 8.6*  AST  --  15 17 23   --   ALT  --  12 9 12   --   ALKPHOS  --  58 56 64  --   BILITOT  --  0.8 0.5 0.7  --   ALBUMIN  --  3.4* 3.1* 3.4*  --   MG  --   --   --  2.1 2.1    ------------------------------------------------------------------------------------------------------------------ No results for input(s): CHOL, HDL, LDLCALC, TRIG, CHOLHDL, LDLDIRECT in the last 72 hours.  Lab Results  Component Value Date   HGBA1C 5.3 05/18/2016    ------------------------------------------------------------------------------------------------------------------ No results for input(s): TSH, T4TOTAL, T3FREE, THYROIDAB in the last 72 hours.  Invalid input(s): FREET3  Cardiac Enzymes No results for input(s): CKMB, TROPONINI, MYOGLOBIN in the last 168 hours.  Invalid input(s): CK ------------------------------------------------------------------------------------------------------------------    Component Value Date/Time   BNP 44.1 08/04/2014 1358    CBG: Recent Labs  Lab 07/29/24 0752  GLUCAP 62*    No results found for this or any previous visit (from the past 240 hours).   Radiology Studies: US  EKG SITE RITE Result Date: 07/31/2024 If Site Rite image not attached, placement could not be confirmed due to current cardiac rhythm.  US  EKG SITE RITE Result Date: 07/31/2024 If Site Rite image not attached, placement could not be confirmed due to current cardiac rhythm.  Nilda Fendt, MD, PhD [facility]  Between 7 am - 7 pm I am available, please contact me via Amion (for emergencies) or Securechat (non urgent messages)  Between 7 pm - 7 am I am not available, please contact night coverage MD/APP via Amion  "

## 2024-08-01 DIAGNOSIS — K56609 Unspecified intestinal obstruction, unspecified as to partial versus complete obstruction: Secondary | ICD-10-CM | POA: Diagnosis not present

## 2024-08-01 LAB — CBC
HCT: 28.5 % — ABNORMAL LOW (ref 39.0–52.0)
Hemoglobin: 9.1 g/dL — ABNORMAL LOW (ref 13.0–17.0)
MCH: 33.1 pg (ref 26.0–34.0)
MCHC: 31.9 g/dL (ref 30.0–36.0)
MCV: 103.6 fL — ABNORMAL HIGH (ref 80.0–100.0)
Platelets: 261 K/uL (ref 150–400)
RBC: 2.75 MIL/uL — ABNORMAL LOW (ref 4.22–5.81)
RDW: 14.2 % (ref 11.5–15.5)
WBC: 5.9 K/uL (ref 4.0–10.5)
nRBC: 0 % (ref 0.0–0.2)

## 2024-08-01 LAB — COMPREHENSIVE METABOLIC PANEL WITH GFR
ALT: 8 U/L (ref 0–44)
AST: 23 U/L (ref 15–41)
Albumin: 3.1 g/dL — ABNORMAL LOW (ref 3.5–5.0)
Alkaline Phosphatase: 56 U/L (ref 38–126)
Anion gap: 10 (ref 5–15)
BUN: 13 mg/dL (ref 8–23)
CO2: 23 mmol/L (ref 22–32)
Calcium: 8.9 mg/dL (ref 8.9–10.3)
Chloride: 101 mmol/L (ref 98–111)
Creatinine, Ser: 0.64 mg/dL (ref 0.61–1.24)
GFR, Estimated: >60 mL/min
Glucose, Bld: 112 mg/dL — ABNORMAL HIGH (ref 70–99)
Potassium: 4.2 mmol/L (ref 3.5–5.1)
Sodium: 134 mmol/L — ABNORMAL LOW (ref 135–145)
Total Bilirubin: 0.4 mg/dL (ref 0.0–1.2)
Total Protein: 6.1 g/dL — ABNORMAL LOW (ref 6.5–8.1)

## 2024-08-01 LAB — GLUCOSE, CAPILLARY
Glucose-Capillary: 117 mg/dL — ABNORMAL HIGH (ref 70–99)
Glucose-Capillary: 106 mg/dL — ABNORMAL HIGH (ref 70–99)
Glucose-Capillary: 113 mg/dL — ABNORMAL HIGH (ref 70–99)
Glucose-Capillary: 97 mg/dL (ref 70–99)
Glucose-Capillary: 112 mg/dL — ABNORMAL HIGH (ref 70–99)

## 2024-08-01 LAB — TYPE AND SCREEN
ABO/RH(D): B NEG
Antibody Screen: POSITIVE
Unit division: 0
Unit division: 0
Weak D: POSITIVE

## 2024-08-01 LAB — BPAM RBC
Blood Product Expiration Date: 202601302359
Blood Product Expiration Date: 202602092359
Unit Type and Rh: 1700
Unit Type and Rh: 1700

## 2024-08-01 LAB — PHOSPHORUS: Phosphorus: 2.6 mg/dL (ref 2.5–4.6)

## 2024-08-01 LAB — MAGNESIUM: Magnesium: 2.2 mg/dL (ref 1.7–2.4)

## 2024-08-01 MED ORDER — TRAVASOL 10 % IV SOLN: INTRAVENOUS | Status: DC

## 2024-08-01 MED ORDER — LACTATED RINGERS IV SOLN: INTRAVENOUS | Status: DC

## 2024-08-01 MED ORDER — OXYCODONE HCL 5 MG PO TABS: 5.0000 mg | ORAL_TABLET | ORAL | Status: DC | PRN

## 2024-08-01 MED ORDER — TRAVASOL 10 % IV SOLN
INTRAVENOUS | Status: AC
  Filled 2024-08-01: qty 792

## 2024-08-01 NOTE — Progress Notes (Signed)
 08/01/2024  Travis Nguyen [id number] Aug 26, 1950  CARE TEAM: PCP: Travis Lenis, MD  Outpatient Care Team: Patient Care Team: Travis Lenis, MD as PCP - General (Family Medicine) Travis Lenis, MD (Inactive) as Consulting Physician (Urology)  Inpatient Treatment Team: Treatment Team:  Travis Nilda HERO, MD Ccs, Md, MD Travis Pinon, MD Travis Nguyen, NT Travis Mylinda KIDD, RN Travis Maurilio CROME, RN [place], [place], NT [place], Travis Nguyen, Travis BROCKS, RN   Problem List:   Principal Problem:   SBO (small bowel obstruction) [facility]) Active Problems:   Multiple myeloma [facility])   Essential hypertension   Pure hypercholesterolemia   Sinus bradycardia   07/28/2024  Procedures: LAPAROSCOPY, DIAGNOSTIC, lysis of adhesions, small bowel resection, APPENDECTOMY, laparoscopic    Assessment [facility] Stay = 11 days) 4 Days Post-Op    Patient overall appears to be improving since yesterday.   Hypertensive likely multifactorial due to established history and current pain.   HGB increasing from 8.6 on 07/31/2024 to 9.1 on 08/01/2024      Plan:  Diet  - Continue with tube feed nutrition  Pain Management  - Continue Toradol  15 MG Q 8 hours prn for pain.  - Continue Hydromorphone  prn for pain  - Continue IV acetaminophen  1,000 mg Q6  hours prn for pain.  DVT/VTE Prophylaxis - Continue Enoxaparin  40 mg Q 24 hours - VTE prophylaxis - SCDs.   - Continue to monitor renal function for nephrotoxicity.  - Continue to monitor electrolytes & replace as needed  Keep K>4, Mg>2, Phos>3  - Continue to monitor HGB  - Mobilize as tolerated to help recovery.  Enlist therapies in moderate/high risk patients as appropriate    -Disposition:  1-3 days depending on patient recovery.   I reviewed last 24 h vitals and pain scores, last 48 h intake and output, last 24 h labs and trends, and last 24 h imaging results.  I have reviewed this patient's available data,  including medical history, events of note, test results, etc as part of my evaluation.   A significant portion of that time was spent in counseling. Care during the described time interval was provided by me.  This care required moderate level of medical decision making.  08/01/2024    Subjective: (Chief complaint)  Patient initially stated overall he feels better than he did yesterday. He then stated pain is still as severe as it was yesterday.   Reports dysuria and straining with urination denies hematuria.  Endorses flatus and one soft, non bloody bowel movement this morning.   Reports persistent burping.      Objective:  Vital signs:  Vitals:   07/31/24 1718 07/31/24 1954 08/01/24 0357 08/01/24 0506  BP: (!) 159/84 (!) 140/88  (!) 153/86  Pulse: 93 78  74  Resp: 18 17  19   Temp: 99.1 F (37.3 C) 98.4 F (36.9 C)  98.6 F (37 C)  TempSrc: Oral Oral  Oral  SpO2: 98% 100%  100%  Weight:   62.5 kg   Height:        Last BM Date : 07/24/24  Intake/Output   Yesterday:  01/17 0701 - 01/18 0700 In: 1950.8 [P.O.:60; I.V.:1890.8] Out: 1350 [Urine:1350] This shift:  No intake/output data recorded.  Bowel function:  Flatus: Yes  BM:  Yes  Drain: (No drain)   Physical Exam:  General: Pt awake/alert in no acute distress Eyes: PERRL, normal EOM.  Sclera clear.  No icterus Neuro: CN II-XII intact w/o focal sensory/motor deficits. Lymph:  No head/neck/groin lymphadenopathy Psych:  No delerium/psychosis/paranoia.  Oriented x 4 HENT: Normocephalic, Mucus membranes moist.  No thrush Neck: Supple, No tracheal deviation.  No obvious thyromegaly Chest: No pain to chest wall compression.  Good respiratory excursion.  No audible wheezing CV:  Pulses intact.  Regular rhythm.  No major extremity edema MS: Normal AROM mjr joints.  No obvious deformity  Abdomen: Moderately distended, distention is worse on the left side. Pain to the RLQ with palpation. No rebound  tenderness or guarding.  No incarcerated hernias.  Ext:   No deformity.  No mjr edema.  No cyanosis Skin: No petechiae / purpurea.  No major sores.  Warm and dry    Results:   Cultures: No results found for this or any previous visit (from the past 720 hours).  Labs: Results for orders placed or performed during the hospital encounter of 07/21/24 (from the past 48 hours)  Basic metabolic panel with GFR     Status: Abnormal   Collection Time: 07/31/24  2:23 AM  Result Value Ref Range   Sodium 136 135 - 145 mmol/L   Potassium 3.8 3.5 - 5.1 mmol/L   Chloride 101 98 - 111 mmol/L   CO2 28 22 - 32 mmol/L   Glucose, Bld 122 (H) 70 - 99 mg/dL    Comment: Glucose reference range applies only to samples taken after fasting for at least 8 hours.   BUN 16 8 - 23 mg/dL   Creatinine, Ser 9.09 0.61 - 1.24 mg/dL   Calcium 8.6 (L) 8.9 - 10.3 mg/dL   GFR, Estimated >39 >39 mL/min    Comment: (NOTE) Calculated using the CKD-EPI Creatinine Equation (2021)    Anion gap 7 5 - 15    Comment: Performed at [facility], [address], [place], [place] [address]  CBC     Status: Abnormal   Collection Time: 07/31/24  2:23 AM  Result Value Ref Range   WBC 5.4 4.0 - 10.5 K/uL   RBC 2.58 (L) 4.22 - 5.81 MIL/uL   Hemoglobin 8.6 (L) 13.0 - 17.0 g/dL   HCT 73.3 (L) 60.9 - 47.9 %   MCV 103.1 (H) 80.0 - 100.0 fL   MCH 33.3 26.0 - 34.0 pg   MCHC 32.3 30.0 - 36.0 g/dL   RDW 85.8 88.4 - 84.4 %   Platelets 346 150 - 400 K/uL   nRBC 0.0 0.0 - 0.2 %    Comment: Performed at [facility], [address] [place], [place] [address]  Magnesium     Status: None   Collection Time: 07/31/24  2:23 AM  Result Value Ref Range   Magnesium 2.1 1.7 - 2.4 mg/dL    Comment: Performed at [facility], [address], [place], [place] [address]  Phosphorus     Status: None   Collection Time: 07/31/24  2:23 AM  Result Value Ref Range   Phosphorus 3.1 2.5 - 4.6  mg/dL    Comment: Performed at [facility], [address], [place], [place] [address]  Glucose, capillary     Status: Abnormal   Collection Time: 07/31/24 11:27 AM  Result Value Ref Range   Glucose-Capillary 109 (H) 70 - 99 mg/dL    Comment: Glucose reference range applies only to samples taken after fasting for at least 8 hours.  Glucose, capillary     Status: None   Collection Time: 07/31/24  3:32 PM  Result Value Ref Range  Glucose-Capillary 88 70 - 99 mg/dL    Comment: Glucose reference range applies only to samples taken after fasting for at least 8 hours.  Glucose, capillary     Status: Abnormal   Collection Time: 07/31/24  7:51 PM  Result Value Ref Range   Glucose-Capillary 111 (H) 70 - 99 mg/dL    Comment: Glucose reference range applies only to samples taken after fasting for at least 8 hours.  Glucose, capillary     Status: Abnormal   Collection Time: 07/31/24 11:54 PM  Result Value Ref Range   Glucose-Capillary 100 (H) 70 - 99 mg/dL    Comment: Glucose reference range applies only to samples taken after fasting for at least 8 hours.  Glucose, capillary     Status: Abnormal   Collection Time: 08/01/24  3:52 AM  Result Value Ref Range   Glucose-Capillary 117 (H) 70 - 99 mg/dL    Comment: Glucose reference range applies only to samples taken after fasting for at least 8 hours.  Comprehensive metabolic panel     Status: Abnormal   Collection Time: 08/01/24  5:52 AM  Result Value Ref Range   Sodium 134 (L) 135 - 145 mmol/L   Potassium 4.2 3.5 - 5.1 mmol/L   Chloride 101 98 - 111 mmol/L   CO2 23 22 - 32 mmol/L   Glucose, Bld 112 (H) 70 - 99 mg/dL    Comment: Glucose reference range applies only to samples taken after fasting for at least 8 hours.   BUN 13 8 - 23 mg/dL   Creatinine, Ser 9.35 0.61 - 1.24 mg/dL   Calcium 8.9 8.9 - 89.6 mg/dL   Total Protein 6.1 (L) 6.5 - 8.1 g/dL   Albumin 3.1 (L) 3.5 - 5.0 g/dL   AST 23 15 - 41 U/L    Comment:  HEMOLYSIS AT THIS LEVEL MAY AFFECT RESULT   ALT 8 0 - 44 U/L   Alkaline Phosphatase 56 38 - 126 U/L   Total Bilirubin 0.4 0.0 - 1.2 mg/dL   GFR, Estimated >39 >39 mL/min    Comment: (NOTE) Calculated using the CKD-EPI Creatinine Equation (2021)    Anion gap 10 5 - 15    Comment: Performed at [facility], [address], [place], [place] [address]  Magnesium     Status: None   Collection Time: 08/01/24  5:52 AM  Result Value Ref Range   Magnesium 2.2 1.7 - 2.4 mg/dL    Comment: Performed at [facility], [address] [place], [place] [address]  Phosphorus     Status: None   Collection Time: 08/01/24  5:52 AM  Result Value Ref Range   Phosphorus 2.6 2.5 - 4.6 mg/dL    Comment: Performed at [facility], [address] [place], [place] [address]  CBC     Status: Abnormal   Collection Time: 08/01/24  5:52 AM  Result Value Ref Range   WBC 5.9 4.0 - 10.5 K/uL   RBC 2.75 (L) 4.22 - 5.81 MIL/uL   Hemoglobin 9.1 (L) 13.0 - 17.0 g/dL   HCT 71.4 (L) 60.9 - 47.9 %   MCV 103.6 (H) 80.0 - 100.0 fL   MCH 33.1 26.0 - 34.0 pg   MCHC 31.9 30.0 - 36.0 g/dL   RDW 85.7 88.4 - 84.4 %   Platelets 261 150 - 400 K/uL   nRBC 0.0 0.0 - 0.2 %    Comment: Performed at [facility], [address] [place], [place] [address]  Glucose, capillary     Status: Abnormal   Collection Time: 08/01/24  7:45 AM  Result Value Ref Range   Glucose-Capillary 106 (H) 70 - 99 mg/dL    Comment: Glucose reference range applies only to samples taken after fasting for at least 8 hours.   *Note: Due to a large number of results and/or encounters for the requested time period, some results have not been displayed. A complete set of results can be found in Results Review.    Imaging / Studies: US  EKG SITE RITE Result Date: 07/31/2024 If Site Rite image not attached, placement could not be confirmed due to current cardiac rhythm.  US  EKG SITE  RITE Result Date: 07/31/2024 If Site Rite image not attached, placement could not be confirmed due to current cardiac rhythm.    Medications / Allergies: per chart  Antibiotics: Anti-infectives (From admission, onward)    Start     Dose/Rate Route Frequency Ordered Stop   07/28/24 0600  cefoTEtan  (CEFOTAN ) 2 g in sodium chloride  0.9 % 100 mL IVPB        2 g 200 mL/hr over 30 Minutes Intravenous On call to O.R. 07/27/24 1410 07/28/24 1227         Note: Portions of this report may have been transcribed using voice recognition software. Every effort was made to ensure accuracy; however, inadvertent computerized transcription errors may be present.   Any transcriptional errors that result from this process are unintentional.    Travis Ronnald Nick PA-[facility]  08/01/2024  8:09 AM

## 2024-08-01 NOTE — Progress Notes (Signed)
 4 Days Post-Op lap LOA and SBR Subjective: Patient with continued abd pain but less nausea.  Passing a little flatus.    Objective: Vital signs in last 24 hours: Temp:  [98.3 F (36.8 C)-99.1 F (37.3 C)] 98.6 F (37 C) (01/18 0506) Pulse Rate:  [72-93] 74 (01/18 0506) Resp:  [17-20] 19 (01/18 0506) BP: (140-169)/(84-99) 153/86 (01/18 0506) SpO2:  [98 %-100 %] 100 % (01/18 0506) Weight:  [62.5 kg] 62.5 kg (01/18 0357)   Intake/Output from previous day: 01/17 0701 - 01/18 0700 In: 1950.8 [P.O.:60; I.V.:1890.8] Out: 1350 [Urine:1350] Intake/Output this shift: No intake/output data recorded.   General appearance: alert and cooperative GI: soft, min distention  Incision: no significant drainage  Lab Results:  Recent Labs    07/31/24 0223 08/01/24 0552  WBC 5.4 5.9  HGB 8.6* 9.1*  HCT 26.6* 28.5*  PLT 346 261   BMET Recent Labs    07/31/24 0223 08/01/24 0552  NA 136 134*  K 3.8 4.2  CL 101 101  CO2 28 23  GLUCOSE 122* 112*  BUN 16 13  CREATININE 0.90 0.64  CALCIUM 8.6* 8.9   PT/INR No results for input(s): LABPROT, INR in the last 72 hours. ABG No results for input(s): PHART, HCO3 in the last 72 hours.  Invalid input(s): PCO2, PO2  MEDS, Scheduled  Chlorhexidine  Gluconate Cloth  6 each Topical Daily   enoxaparin  (LOVENOX ) injection  40 mg Subcutaneous Q24H   insulin  aspart  0-15 Units Subcutaneous Q4H   methocarbamol  (ROBAXIN ) injection  1,000 mg Intravenous Q8H   sodium chloride  flush  10-40 mL Intracatheter Q12H   thiamine  (VITAMIN B1) injection  100 mg Intravenous Q24H    Studies/Results: US  EKG SITE RITE Result Date: 07/31/2024 If Site Rite image not attached, placement could not be confirmed due to current cardiac rhythm.  US  EKG SITE RITE Result Date: 07/31/2024 If Site Rite image not attached, placement could not be confirmed due to current cardiac rhythm.   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 03/03/2023   Port-A-Cath in place 08/28/2018   DVT (deep venous thrombosis) (HCC) 07/03/2017   UTI (urinary tract infection) 07/03/2017   Chronic back pain 05/18/2016   SBO (small bowel obstruction) (HCC) 05/29/2014   Small bowel obstruction (HCC) 03/18/2014   S/P prostatectomy 03/18/2014   Essential hypertension 03/18/2014   Patient left without being seen 09/25/2013   Prostate cancer (HCC) 01/01/2013   Multiple myeloma (HCC) 09/14/2012   Elevated PSA 08/17/2012   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

## 2024-08-01 NOTE — Progress Notes (Signed)
 " PROGRESS NOTE  Travis Nguyen FMW:[id number] DOB: 12-20-1950 DOA: 07/21/2024 PCP: [name], MD   LOS: 11 days   Brief Narrative / Interim history: 74 year old male with history of multiple myeloma on carfilzomib  as well as daratumumab , HTN, prior prostatectomy, prior SBO's comes into the hospital with small bowel obstruction.  General surgery consulted, he has failed conservative management and will undergo surgery today  Subjective / 24h Interval events: He is doing well, denies any nausea or vomiting.  NG tube in advertently came out yesterday but patient refused replacement.  He is passing some gas but no BMs.  Surgery started TPN  Assesement and Plan: Principal problem Small bowel obstruction-he was admitted to the hospital, NG tube was placed.  He has not improved with conservative management and he was taken to the OR on 1/14 and underwent laparoscopic LOA, small bowel resection, appendicectomy - Anticipate a degree of postoperative ileus, started on TPN.  Passing some gas.  I have encouraged patient to ambulate more  Blood active problems Sinus bradycardia-stable  Essential hypertension-pressure still on the higher side but better.  Blood pressure is stable  Multiple myeloma-outpatient management  Normocytic anemia-in the setting of underlying malignancy.  Closely monitor hemoglobin postoperatively, overall stable, 9.1 today  Scheduled Meds:  Chlorhexidine  Gluconate Cloth  6 each Topical Daily   enoxaparin  (LOVENOX ) injection  40 mg Subcutaneous Q24H   insulin  aspart  0-15 Units Subcutaneous Q4H   methocarbamol  (ROBAXIN ) injection  1,000 mg Intravenous Q8H   sodium chloride  flush  10-40 mL Intracatheter Q12H   thiamine  (VITAMIN B1) injection  100 mg Intravenous Q24H   Continuous Infusions:  lactated ringers  Stopped (08/01/24 0818)   lactated ringers      TPN ADULT (ION) 40 mL/hr at 07/31/24 1711   TPN ADULT (ION)     PRN Meds:.acetaminophen , alum & mag  hydroxide-simeth, hydrALAZINE , HYDROmorphone  (DILAUDID ) injection, labetalol , lip balm, magic mouthwash, menthol , ondansetron  **OR** ondansetron  (ZOFRAN ) IV, mouth rinse, phenol, simethicone , sodium chloride  flush  Current Outpatient Medications  Medication Instructions   acyclovir  (ZOVIRAX ) 400 mg, Oral, 2 times daily   amLODipine  (NORVASC ) 5 mg, Oral, Daily   aspirin EC 81 mg, Oral, Daily, Swallow whole.   lactulose  (CHRONULAC ) 10 g, Oral, Daily PRN   lidocaine -prilocaine  (EMLA ) cream APPLY TOPICALLY TO PORT-A-CATH DAILY AS NEEDED   losartan  (COZAAR ) 100 mg, Oral, Daily   methocarbamol  (ROBAXIN ) 500 mg, Oral, Every 6 hours PRN   oxyCODONE -acetaminophen  (PERCOCET ) 10-325 MG tablet 1 tablet, Oral, 2 times daily PRN   oxyCODONE -acetaminophen  (PERCOCET ) 10-325 MG tablet 1 tablet, Oral, 2 times daily PRN   POMALYST  2 MG capsule TAKE 1 CAPSULE BY MOUTH DAILY  FOR 21 DAYS, THEN 7 DAYS OFF   senna-docusate (SENNA S) 8.6-50 MG tablet 1 tablet, Oral, Daily at bedtime    Diet Orders (From admission, onward)     Start     Ordered   08/01/24 0817  Diet NPO time specified Except for: Ice Chips, Sips with Meds  Diet effective now       Comments: Ok for sips of clears  Question Answer Comment  Except for [employer]   Except for Sips with Meds      08/01/24 0816            DVT prophylaxis: enoxaparin  (LOVENOX ) injection 40 mg Start: 07/30/24 1000 SCDs Start: 07/21/24 0917   Lab Results  Component Value Date   PLT 261 08/01/2024      Code Status: Full Code  Family  Communication: No family at bedside  Status is: Inpatient Remains inpatient appropriate because: Severity of illness   Level of care: Med-Surg  Consultants:  General surgery  Objective: Vitals:   07/31/24 1718 07/31/24 1954 08/01/24 0357 08/01/24 0506  BP: (!) 159/84 (!) 140/88  (!) 153/86  Pulse: 93 78  74  Resp: 18 17  19   Temp: 99.1 F (37.3 C) 98.4 F (36.9 C)  98.6 F (37 C)  TempSrc: Oral Oral  Oral   SpO2: 98% 100%  100%  Weight:   62.5 kg   Height:        Intake/Output Summary (Last 24 hours) at 08/01/2024 1056 Last data filed at 08/01/2024 1000 Gross per 24 hour  Intake 1516.75 ml  Output 1050 ml  Net 466.75 ml   Wt Readings from Last 3 Encounters:  08/01/24 62.5 kg  07/16/24 65.4 kg  07/07/24 69.9 kg    Examination: Constitutional: NAD Eyes: lids and conjunctivae normal, no scleral icterus ENMT: mmm Neck: normal, supple Respiratory: clear to auscultation bilaterally, no wheezing, no crackles. Normal respiratory effort.  Cardiovascular: Regular rate and rhythm, no murmurs / rubs / gallops. No LE edema. Abdomen: soft, no distention, no tenderness. Bowel sounds positive.   Data Reviewed: I have independently reviewed following labs and imaging studies   CBC Recent Labs  Lab 07/27/24 1350 07/28/24 0420 07/30/24 0237 07/31/24 0223 08/01/24 0552  WBC 5.0 4.9 8.5 5.4 5.9  HGB 8.9* 8.4* 9.4* 8.6* 9.1*  HCT 27.7* 26.8* 29.1* 26.6* 28.5*  PLT 226 212 339 346 261  MCV 102.6* 106.3* 104.3* 103.1* 103.6*  MCH 33.0 33.3 33.7 33.3 33.1  MCHC 32.1 31.3 32.3 32.3 31.9  RDW 14.0 14.0 14.0 14.1 14.2    Recent Labs  Lab 07/26/24 0459 07/28/24 0420 07/30/24 0237 07/31/24 0223 08/01/24 0552  NA 135 135 137 136 134*  K 3.6 4.2 3.9 3.8 4.2  CL 102 104 101 101 101  CO2 24 24 22 28 23   GLUCOSE 90 70 73 122* 112*  BUN 10 6* 9 16 13   CREATININE 0.66 0.60* 0.72 0.90 0.64  CALCIUM 8.6* 8.1* 8.6* 8.6* 8.9  AST 15 17 23   --  23  ALT 12 9 12   --  8  ALKPHOS 58 56 64  --  56  BILITOT 0.8 0.5 0.7  --  0.4  ALBUMIN 3.4* 3.1* 3.4*  --  3.1*  MG  --   --  2.1 2.1 2.2    ------------------------------------------------------------------------------------------------------------------ No results for input(s): CHOL, HDL, LDLCALC, TRIG, CHOLHDL, LDLDIRECT in the last 72 hours.  Lab Results  Component Value Date   HGBA1C 5.3 05/18/2016    ------------------------------------------------------------------------------------------------------------------ No results for input(s): TSH, T4TOTAL, T3FREE, THYROIDAB in the last 72 hours.  Invalid input(s): FREET3  Cardiac Enzymes No results for input(s): CKMB, TROPONINI, MYOGLOBIN in the last 168 hours.  Invalid input(s): CK ------------------------------------------------------------------------------------------------------------------    Component Value Date/Time   BNP 44.1 08/04/2014 1358    CBG: Recent Labs  Lab 07/31/24 1532 07/31/24 1951 07/31/24 2354 08/01/24 0352 08/01/24 0745  GLUCAP 88 111* 100* 117* 106*    No results found for this or any previous visit (from the past 240 hours).   Radiology Studies: No results found.   Nilda Fendt, MD, PhD [facility]  Between 7 am - 7 pm I am available, please contact me via Amion (for emergencies) or Securechat (non urgent messages)  Between 7 pm - 7 am I am not available, please  contact night coverage MD/APP via Amion  "

## 2024-08-01 NOTE — Plan of Care (Signed)

## 2024-08-01 NOTE — Progress Notes (Addendum)
 PHARMACY - TOTAL PARENTERAL NUTRITION CONSULT NOTE   Indication: Prolonged ileus  Patient Measurements: Height: 6' 1 (185.4 cm) Weight: 62.5 kg (137 lb 12.6 oz) IBW/kg (Calculated) : 79.9 TPN AdjBW (KG): 65.6 Body mass index is 18.18 kg/m. Usual Weight:   Assessment: Patient is a 73yoM presented on 1/7 with recurrent SBO on CT and later concerns for appendicitis. PMH of multiple myeloma, prostatectomy on pomalyst  (on hold), SBO (usually self-resolves per patient). Patient is s/p ex-lap LOA, small bowel resection, appendectomy on 1/14. No BM or flatulence since surg. Patient at risk for refeeding syndrome given poor po intake >7 days. Pharmacy consulted for TPN management in setting of post-op prolonged ileus.   Glucose / Insulin : No hx DM, not currently on insulin   -noted few episodes of hypoglycemic (60-70s) on 1/15, since resolved post TPN initiation  -CBG goal: 140-180 (range: 109-117) -SSI/24hr: 0 units  Electrolytes: Na (134) borderline, CO2 down to 23, all other lytes WNL, including CorrCa  Renal: Scr <1/WNL, BUN down to 13 Hepatic: Alb 3.1, LFTs and Tbili WNL Intake / Output; MIVF:  - PO intake: 60 mL - UOP: 1350 mL, x 1 occurrence - last BM: 1/18, x 1 occurrence with a little flatus per CCS  - mIVF: LR @35  ml/hr. MD rec to increase rate given very dark urine this AM. Will consider reducing once resolved. 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  1/9 AXR: Multiple gas-filled dilated loops of small bowel within the central abdomen, consistent with small bowel obstruction 1/13 AXR: persistent SBO 1/14 AXR: high-grade partial SBO, colonic diverticulosis   GI Surgeries / Procedures:  1/14: ex-lap LOA, small bowel resection, appendectomy  Central access: Port  TPN start date: 1/17  Nutritional Goals:  Goal TPN rate is 85 mL/hr  (provides 112.2 g of protein and 2177 kcals per day)  RD Assessment:  Estimated Needs Total Energy Estimated Needs: 2100-2300 Total Protein Estimated Needs: 100-120 grams Total Fluid Estimated Needs: >/= 2 L  Current Nutrition:  1/10 - diet advanced to clear liquids 1/12 - diet advanced to full liquids; downgraded back to clear liquids NPO, sips with meds  TPN 1/17  Plan:  Advance TPN to 37mL/hr at 1800 and assess tolerance   Electrolytes in TPN: Na 60 mEq/L (increase), K 51mEq/L, Ca 40mEq/L, Mg 8mEq/L, and Phos 15mmol/L Cl:Ac 1:2 (increase acetate) Add standard MVI and trace elements to TPN Continue Moderate q4h SSI and adjust as needed  Thiamine  100mg  IV daily x 7 days (thru 1/24) per RD  Increase MIVF to 83ml/hr per MD  Monitor TPN labs on Mon/Thurs, and PRN; Mg/K/Phos daily for at least 3 days   Dinara Lupu 08/01/2024,8:24 AM

## 2024-08-01 NOTE — Progress Notes (Signed)
 Mobility Specialist - Progress Note   08/01/24 0856  Mobility  Activity Ambulated with assistance  Level of Assistance Contact guard assist, steadying assist  Assistive Device None  Distance Ambulated (ft) 300 ft  Range of Motion/Exercises Active  Activity Response Tolerated well  Mobility visit 1 Mobility  Mobility Specialist Start Time (ACUTE ONLY) 0846  Mobility Specialist Stop Time (ACUTE ONLY) 0856  Mobility Specialist Time Calculation (min) (ACUTE ONLY) 10 min   Pt was found in bed and agreeable to mobilize. C/o abdominal soreness. At EOS returned to bed with all needs met. Call bell in reach and bed alarm on.   Erminio Leos,  Mobility Specialist Can be reached via Secure Chat

## 2024-08-02 DIAGNOSIS — E43 Unspecified severe protein-calorie malnutrition: Secondary | ICD-10-CM | POA: Insufficient documentation

## 2024-08-02 LAB — GLUCOSE, CAPILLARY
Glucose-Capillary: 103 mg/dL — ABNORMAL HIGH (ref 70–99)
Glucose-Capillary: 108 mg/dL — ABNORMAL HIGH (ref 70–99)
Glucose-Capillary: 111 mg/dL — ABNORMAL HIGH (ref 70–99)
Glucose-Capillary: 112 mg/dL — ABNORMAL HIGH (ref 70–99)
Glucose-Capillary: 121 mg/dL — ABNORMAL HIGH (ref 70–99)
Glucose-Capillary: 123 mg/dL — ABNORMAL HIGH (ref 70–99)

## 2024-08-02 LAB — MAGNESIUM: Magnesium: 2.1 mg/dL (ref 1.7–2.4)

## 2024-08-02 LAB — COMPREHENSIVE METABOLIC PANEL WITH GFR
ALT: 9 U/L (ref 0–44)
AST: 20 U/L (ref 15–41)
Albumin: 3.1 g/dL — ABNORMAL LOW (ref 3.5–5.0)
Alkaline Phosphatase: 54 U/L (ref 38–126)
Anion gap: 7 (ref 5–15)
BUN: 14 mg/dL (ref 8–23)
CO2: 28 mmol/L (ref 22–32)
Calcium: 8.8 mg/dL — ABNORMAL LOW (ref 8.9–10.3)
Chloride: 103 mmol/L (ref 98–111)
Creatinine, Ser: 0.52 mg/dL — ABNORMAL LOW (ref 0.61–1.24)
GFR, Estimated: >60 mL/min
Glucose, Bld: 114 mg/dL — ABNORMAL HIGH (ref 70–99)
Potassium: 4.1 mmol/L (ref 3.5–5.1)
Sodium: 138 mmol/L (ref 135–145)
Total Bilirubin: 0.3 mg/dL (ref 0.0–1.2)
Total Protein: 6.1 g/dL — ABNORMAL LOW (ref 6.5–8.1)

## 2024-08-02 LAB — TRIGLYCERIDES: Triglycerides: 132 mg/dL

## 2024-08-02 LAB — PHOSPHORUS: Phosphorus: 3 mg/dL (ref 2.5–4.6)

## 2024-08-02 MED ORDER — ACETAMINOPHEN 500 MG PO TABS
1000.0000 mg | ORAL_TABLET | Freq: Four times a day (QID) | ORAL | Status: DC
  Administered 2024-08-02 – 2024-08-05 (×12): 1000 mg via ORAL
  Filled 2024-08-02 (×14): qty 2

## 2024-08-02 MED ORDER — TRAMADOL HCL 50 MG PO TABS
50.0000 mg | ORAL_TABLET | Freq: Four times a day (QID) | ORAL | Status: DC | PRN
  Administered 2024-08-03 – 2024-08-05 (×3): 50 mg via ORAL
  Filled 2024-08-02 (×4): qty 1

## 2024-08-02 MED ORDER — HYDROMORPHONE HCL 1 MG/ML IJ SOLN
0.5000 mg | INTRAMUSCULAR | Status: DC | PRN
  Administered 2024-08-02 – 2024-08-03 (×3): 1 mg via INTRAVENOUS
  Filled 2024-08-02 (×4): qty 1

## 2024-08-02 MED ORDER — METHOCARBAMOL 1000 MG/10ML IJ SOLN
1000.0000 mg | Freq: Three times a day (TID) | INTRAMUSCULAR | Status: DC | PRN
  Administered 2024-08-03: 1000 mg via INTRAVENOUS
  Filled 2024-08-02 (×2): qty 10

## 2024-08-02 MED ORDER — TRAVASOL 10 % IV SOLN
INTRAVENOUS | Status: AC
  Filled 2024-08-02: qty 1122

## 2024-08-02 NOTE — Progress Notes (Signed)
 5 Days Post-Op lap LOA and SBR Subjective: Feeling somewhat better today.  Denies nausea, reports pain is well-controlled.  Ongoing flatus but no bowel movement yet.  Objective: Vital signs in last 24 hours: Temp:  [98.2 F (36.8 C)-98.7 F (37.1 C)] 98.6 F (37 C) (01/19 0410) Pulse Rate:  [65-77] 67 (01/19 0410) Resp:  [16-18] 18 (01/19 0410) BP: (142-177)/(81-93) 142/92 (01/19 0410) SpO2:  [100 %] 100 % (01/19 0410) Weight:  [62.5 kg] 62.5 kg (01/19 0500)   Intake/Output from previous day: 01/18 0701 - 01/19 0700 In: 2355.2 [I.V.:2355.2] Out: 750 [Urine:750] Intake/Output this shift: Total I/O In: -  Out: 100 [Urine:100]   Alert, no distress Unlabored respirations Abdomen is soft, mildly distended, incisions are clean, dry and intact without cellulitis or hematoma.  Lab Results:  Recent Labs    07/31/24 0223 08/01/24 0552  WBC 5.4 5.9  HGB 8.6* 9.1*  HCT 26.6* 28.5*  PLT 346 261   BMET Recent Labs    08/01/24 0552 08/02/24 0427  NA 134* 138  K 4.2 4.1  CL 101 103  CO2 23 28  GLUCOSE 112* 114*  BUN 13 14  CREATININE 0.64 0.52*  CALCIUM 8.9 8.8*   PT/INR No results for input(s): LABPROT, INR in the last 72 hours. ABG No results for input(s): PHART, HCO3 in the last 72 hours.  Invalid input(s): PCO2, PO2  MEDS, Scheduled  Chlorhexidine  Gluconate Cloth  6 each Topical Daily   enoxaparin  (LOVENOX ) injection  40 mg Subcutaneous Q24H   insulin  aspart  0-15 Units Subcutaneous Q4H   methocarbamol  (ROBAXIN ) injection  1,000 mg Intravenous Q8H   sodium chloride  flush  10-40 mL Intracatheter Q12H   thiamine  (VITAMIN B1) injection  100 mg Intravenous Q24H    Studies/Results: No results found.   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 03/03/2023   Port-A-Cath in place 08/28/2018   DVT (deep venous thrombosis) (HCC) 07/03/2017   UTI (urinary tract infection) 07/03/2017   Chronic back pain 05/18/2016   SBO (small bowel obstruction) (HCC) 05/29/2014   Small bowel obstruction (HCC) 03/18/2014   S/P prostatectomy 03/18/2014   Essential hypertension 03/18/2014   Patient left without being seen 09/25/2013   Prostate cancer (HCC) 01/01/2013   Multiple myeloma (HCC) 09/14/2012   Elevated PSA 08/17/2012   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   Anemia 08/13/2012    Expected post op ileus  Plan:  SBO -s/p laparoscopic lysis of adhesions, small bowel resection (ileum), appendectomy on 07/28/2024 by Dr. Sheldon -Expected postop ileus, seems to be slowly improving.  Continue TPN/sips of clears only.  Continue ambulating.   FEN - NPO, NGT VTE - SCDs ID - none currently needed   I reviewed hospitalist notes, last 24 h vitals and pain scores, last 48 h intake and output, last 24 h labs and trends, and last 24 h imaging results.  Mitzie DELENA Freund MD [facility], [place]    08/02/2024 9:23 AM

## 2024-08-02 NOTE — Progress Notes (Signed)
 " PROGRESS NOTE  Travis Nguyen FMW:[id number] DOB: June 27, 1951 DOA: 07/21/2024 PCP: [name], MD   LOS: 12 days   Brief Narrative / Interim history: 74 year old male with history of multiple myeloma on carfilzomib  as well as daratumumab , HTN, prior prostatectomy, prior SBO's comes into the hospital with small bowel obstruction.  General surgery consulted, he has failed conservative management and will undergo surgery today  Subjective / 24h Interval events: He is doing well, has been passing more gas.  Assesement and Plan: Principal problem Small bowel obstruction-he was admitted to the hospital, NG tube was placed.  He has not improved with conservative management and he was taken to the OR on 1/14 and underwent laparoscopic LOA, small bowel resection, appendicectomy - Anticipate a degree of postoperative ileus, started on TPN.  Passing more gas.  Continue to ambulate.  Rest of management per surgery  Blood active problems Sinus bradycardia-stable  Essential hypertension-blood pressure overall acceptable  Multiple myeloma-outpatient management  Normocytic anemia-in the setting of underlying malignancy.  Closely monitor hemoglobin postoperatively, overall stable, repeat in the morning  Scheduled Meds:  acetaminophen   1,000 mg Oral Q6H   Chlorhexidine  Gluconate Cloth  6 each Topical Daily   enoxaparin  (LOVENOX ) injection  40 mg Subcutaneous Q24H   insulin  aspart  0-15 Units Subcutaneous Q4H   sodium chloride  flush  10-40 mL Intracatheter Q12H   thiamine  (VITAMIN B1) injection  100 mg Intravenous Q24H   Continuous Infusions:  lactated ringers  50 mL/hr at 08/01/24 2102   TPN ADULT (ION) 60 mL/hr at 08/01/24 1736   PRN Meds:.alum & mag hydroxide-simeth, hydrALAZINE , HYDROmorphone  (DILAUDID ) injection, labetalol , lip balm, methocarbamol  (ROBAXIN ) injection, ondansetron  **OR** ondansetron  (ZOFRAN ) IV, mouth rinse, simethicone , sodium chloride  flush, traMADol   Current  Outpatient Medications  Medication Instructions   acyclovir  (ZOVIRAX ) 400 mg, Oral, 2 times daily   amLODipine  (NORVASC ) 5 mg, Oral, Daily   aspirin EC 81 mg, Oral, Daily, Swallow whole.   lactulose  (CHRONULAC ) 10 g, Oral, Daily PRN   lidocaine -prilocaine  (EMLA ) cream APPLY TOPICALLY TO PORT-A-CATH DAILY AS NEEDED   losartan  (COZAAR ) 100 mg, Oral, Daily   methocarbamol  (ROBAXIN ) 500 mg, Oral, Every 6 hours PRN   oxyCODONE -acetaminophen  (PERCOCET ) 10-325 MG tablet 1 tablet, Oral, 2 times daily PRN   oxyCODONE -acetaminophen  (PERCOCET ) 10-325 MG tablet 1 tablet, Oral, 2 times daily PRN   POMALYST  2 MG capsule TAKE 1 CAPSULE BY MOUTH DAILY  FOR 21 DAYS, THEN 7 DAYS OFF   senna-docusate (SENNA S) 8.6-50 MG tablet 1 tablet, Oral, Daily at bedtime    Diet Orders (From admission, onward)     Start     Ordered   08/02/24 0927  Diet NPO time specified Except for: Ice Chips, Sips with Meds, Other (See Comments)  Diet effective now       Comments: Ok for sips of clears  Question Answer Comment  Except for [employer]   Except for Sips with Meds   Except for Other (See Comments)      08/02/24 0932            DVT prophylaxis: enoxaparin  (LOVENOX ) injection 40 mg Start: 07/30/24 1000 SCDs Start: 07/21/24 0917   Lab Results  Component Value Date   PLT 261 08/01/2024      Code Status: Full Code  Family Communication: No family at bedside  Status is: Inpatient Remains inpatient appropriate because: Severity of illness   Level of care: Med-Surg  Consultants:  General surgery  Objective: Vitals:   08/01/24 1609  08/01/24 2000 08/02/24 0410 08/02/24 0500  BP: (!) 152/81 (!) 162/93 (!) 142/92   Pulse: 77 75 67   Resp: 16 18 18    Temp: 98.7 F (37.1 C) 98.2 F (36.8 C) 98.6 F (37 C)   TempSrc: Oral Oral Oral   SpO2: 100% 100% 100%   Weight:    62.5 kg  Height:        Intake/Output Summary (Last 24 hours) at 08/02/2024 1120 Last data filed at 08/02/2024 1000 Gross per 24  hour  Intake 2355.22 ml  Output 600 ml  Net 1755.22 ml   Wt Readings from Last 3 Encounters:  08/02/24 62.5 kg  07/16/24 65.4 kg  07/07/24 69.9 kg    Examination: Constitutional: NAD Eyes: lids and conjunctivae normal, no scleral icterus ENMT: mmm Neck: normal, supple Respiratory: clear to auscultation bilaterally, no wheezing, no crackles. Normal respiratory effort.  Cardiovascular: Regular rate and rhythm, no murmurs / rubs / gallops. No LE edema. Abdomen: soft, no distention, no tenderness. Bowel sounds positive.   Data Reviewed: I have independently reviewed following labs and imaging studies   CBC Recent Labs  Lab 07/27/24 1350 07/28/24 0420 07/30/24 0237 07/31/24 0223 08/01/24 0552  WBC 5.0 4.9 8.5 5.4 5.9  HGB 8.9* 8.4* 9.4* 8.6* 9.1*  HCT 27.7* 26.8* 29.1* 26.6* 28.5*  PLT 226 212 339 346 261  MCV 102.6* 106.3* 104.3* 103.1* 103.6*  MCH 33.0 33.3 33.7 33.3 33.1  MCHC 32.1 31.3 32.3 32.3 31.9  RDW 14.0 14.0 14.0 14.1 14.2    Recent Labs  Lab 07/28/24 0420 07/30/24 0237 07/31/24 0223 08/01/24 0552 08/02/24 0427  NA 135 137 136 134* 138  K 4.2 3.9 3.8 4.2 4.1  CL 104 101 101 101 103  CO2 24 22 28 23 28   GLUCOSE 70 73 122* 112* 114*  BUN 6* 9 16 13 14   CREATININE 0.60* 0.72 0.90 0.64 0.52*  CALCIUM 8.1* 8.6* 8.6* 8.9 8.8*  AST 17 23  --  23 20  ALT 9 12  --  8 9  ALKPHOS 56 64  --  56 54  BILITOT 0.5 0.7  --  0.4 0.3  ALBUMIN 3.1* 3.4*  --  3.1* 3.1*  MG  --  2.1 2.1 2.2 2.1    ------------------------------------------------------------------------------------------------------------------ Recent Labs    08/02/24 0427  TRIG 132    Lab Results  Component Value Date   HGBA1C 5.3 05/18/2016   ------------------------------------------------------------------------------------------------------------------ No results for input(s): TSH, T4TOTAL, T3FREE, THYROIDAB in the last 72 hours.  Invalid input(s): FREET3  Cardiac  Enzymes No results for input(s): CKMB, TROPONINI, MYOGLOBIN in the last 168 hours.  Invalid input(s): CK ------------------------------------------------------------------------------------------------------------------    Component Value Date/Time   BNP 44.1 08/04/2014 1358    CBG: Recent Labs  Lab 08/01/24 1627 08/01/24 1957 08/01/24 2359 08/02/24 0407 08/02/24 0747  GLUCAP 97 112* 112* 121* 123*    No results found for this or any previous visit (from the past 240 hours).   Radiology Studies: No results found.   Nilda Fendt, MD, PhD [facility]  Between 7 am - 7 pm I am available, please contact me via Amion (for emergencies) or Securechat (non urgent messages)  Between 7 pm - 7 am I am not available, please contact night coverage MD/APP via Amion  "

## 2024-08-02 NOTE — Progress Notes (Signed)
 Nutrition Follow-up  DOCUMENTATION CODES:   Severe malnutrition in context of acute illness/injury, Underweight  INTERVENTION:   Monitor magnesium, potassium, and phosphorus daily for at least 3 days, MD to replete as needed, as pt is at risk for refeeding syndrome.  -Continue Thiamine  100 mg daily for at least 7 days   -TPN management per Pharmacy -Daily weights while on TPN  -Will monitor for diet advancement  NUTRITION DIAGNOSIS:   Severe Malnutrition related to acute illness (SBO) as evidenced by severe fat depletion, severe muscle depletion, energy intake < 75% for > 7 days.  GOAL:   Patient will meet greater than or equal to 90% of their needs  Progressing with TPN  MONITOR:   Diet advancement, Labs, I & O's, Weight trends, TPN  ASSESSMENT:   74 y.o. male presented to the ED with abdominal pain and constipation. PMH includes prostate cancer, DVT, depression, HTN, multiple myeloma, small bowel obstruction, and HLD. Pt admitted with small bowel obstruction.  1/07 - Admitted; NGT placed to LIWS 1/10 - NGT removed; diet advanced to clear liquids 1/12 - diet advanced to full liquids; downgraded back to clear liquids 1/13 - NPO; NGT replaced to LIWS 1/14 - OP s/p laparoscopic lysis of adhesions, small bowel resection (7.5 cm), appendectomy 1/16 - NGT removed 1/17 - initiation of TPN   Patient continues to be NPO, allowed ice chips. RD brought some to bedside for patient.  Pt reports he last ate and tolerated chicken on 1/7. Had been having abdominal pain, denies any vomiting.   TPN to advance to goal rate of 85 ml/hr today, providing 2177 kcals and 112g protein.   Admission weight: 144 lbs Current weight: 137 lbs  Medications: insulin , Thiamine    Labs reviewed: CBGs: 103-123 TG 132   NUTRITION - FOCUSED PHYSICAL EXAM:  Flowsheet Row Most Recent Value  Orbital Region Moderate depletion  Upper Arm Region Severe depletion  Thoracic and Lumbar Region Severe  depletion  Buccal Region Severe depletion  Temple Region Severe depletion  Clavicle Bone Region Severe depletion  Clavicle and Acromion Bone Region Severe depletion  Scapular Bone Region Severe depletion  Dorsal Hand Severe depletion  Patellar Region Severe depletion  Anterior Thigh Region Severe depletion  Posterior Calf Region Severe depletion  Edema (RD Assessment) None  Hair Unable to assess  Eyes Reviewed  Mouth Reviewed  [missing teeth]  Skin Reviewed  Nails Reviewed    Diet Order:   Diet Order             Diet NPO time specified Except for: Ice Chips, Sips with Meds, Other (See Comments)  Diet effective now                   EDUCATION NEEDS:   Not appropriate for education at this time  Skin:  Skin Assessment: Skin Integrity Issues: Skin Integrity Issues:: Incisions Incisions: 1/14 abdomen  Last BM:  1/10  Height:   Ht Readings from Last 1 Encounters:  07/21/24 6' 1 (1.854 m)    Weight:   Wt Readings from Last 1 Encounters:  08/02/24 62.5 kg    Ideal Body Weight:  83.6 kg  BMI:  Body mass index is 18.18 kg/m.  Estimated Nutritional Needs:   Kcal:  2100-2300  Protein:  100-120 grams  Fluid:  >/= 2 L   Morna Lee, MS, RD, LDN Inpatient Clinical Dietitian Contact via Secure chat

## 2024-08-02 NOTE — Progress Notes (Signed)
 PHARMACY - TOTAL PARENTERAL NUTRITION CONSULT NOTE   Indication: Prolonged ileus  Patient Measurements: Height: 6' 1 (185.4 cm) Weight: 62.5 kg (137 lb 12.6 oz) IBW/kg (Calculated) : 79.9 TPN AdjBW (KG): 65.6 Body mass index is 18.18 kg/m. Usual Weight:   Assessment: Patient is a 73yoM presented on 1/7 with recurrent SBO on CT and later concerns for appendicitis. PMH of multiple myeloma, prostatectomy on pomalyst  (on hold), SBO (usually self-resolves per patient). Patient is s/p ex-lap LOA, small bowel resection, appendectomy on 1/14. No BM or flatulence since surg. Patient at risk for refeeding syndrome given poor po intake >7 days. Pharmacy consulted for TPN management in setting of post-op prolonged ileus.   Glucose / Insulin : No hx DM, not currently on insulin   -noted few episodes of hypoglycemic (60-70s) on 1/15, since resolved post TPN initiation  -CBG goal: 140-180 (range: 97-123) -SSI/24hr: 4 units  Electrolytes:    - electrolytes WNL, including CorrCa at 9.5 Renal: Scr <1/WNL, BUN  14 Hepatic: Alb 3.1, LFTs and Tbili WNL Intake / Output; MIVF:  - PO intake: 0 mL - UOP: 750 mL, x 1 occurrence - last BM: 1/18, x 1 occurrence with a little flatus per CCS  - mIVF: LR @50  ml/hr. MD rec to increase rate given very dark urine this AM. Will consider reducing once resolved. 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  1/9 AXR: Multiple gas-filled dilated loops of small bowel within the central abdomen, consistent with small bowel obstruction 1/13 AXR: persistent SBO 1/14 AXR: high-grade partial SBO, colonic diverticulosis   GI Surgeries / Procedures:  1/14: ex-lap LOA, small bowel resection, appendectomy  Central access: Port  TPN start date: 1/17  Nutritional Goals:  Goal TPN rate is 85 mL/hr (provides 112.2 g of protein and 2177 kcals  per day)  RD Assessment:  Estimated Needs Total Energy Estimated Needs: 2100-2300 Total Protein Estimated Needs: 100-120 grams Total Fluid Estimated Needs: >/= 2 L  Current Nutrition:  1/10 - diet advanced to clear liquids 1/12 - diet advanced to full liquids; downgraded back to clear liquids NPO, sips with meds  TPN 1/17  Plan:  Advance TPN to 85 mL/hr at 1800, the target rate  Electrolytes in TPN: Na 60 mEq/L  , K 69mEq/L, Ca 3mEq/L, Mg 8mEq/L, and Phos 15mmol/L Cl:Ac 1:2   Add standard MVI and trace elements to TPN Continue Moderate q4h SSI and adjust as needed  Thiamine  100mg  IV daily x 7 days (thru 1/24) per RD  MIVF to 81ml/hr per MD  Monitor TPN labs on Mon/Thurs, and PRN; Mg/K/Phos daily for at least 3 days     Dolphus Roller, PharmD, BCPS 08/02/2024 11:25 AM

## 2024-08-03 LAB — CBC
HCT: 24.9 % — ABNORMAL LOW (ref 39.0–52.0)
Hemoglobin: 8.1 g/dL — ABNORMAL LOW (ref 13.0–17.0)
MCH: 33.1 pg (ref 26.0–34.0)
MCHC: 32.5 g/dL (ref 30.0–36.0)
MCV: 101.6 fL — ABNORMAL HIGH (ref 80.0–100.0)
Platelets: 383 K/uL (ref 150–400)
RBC: 2.45 MIL/uL — ABNORMAL LOW (ref 4.22–5.81)
RDW: 14.6 % (ref 11.5–15.5)
WBC: 5.6 K/uL (ref 4.0–10.5)
nRBC: 0 % (ref 0.0–0.2)

## 2024-08-03 LAB — BASIC METABOLIC PANEL WITH GFR
Anion gap: 7 (ref 5–15)
BUN: 16 mg/dL (ref 8–23)
CO2: 27 mmol/L (ref 22–32)
Calcium: 8.9 mg/dL (ref 8.9–10.3)
Chloride: 102 mmol/L (ref 98–111)
Creatinine, Ser: 0.55 mg/dL — ABNORMAL LOW (ref 0.61–1.24)
GFR, Estimated: >60 mL/min
Glucose, Bld: 111 mg/dL — ABNORMAL HIGH (ref 70–99)
Potassium: 4.4 mmol/L (ref 3.5–5.1)
Sodium: 136 mmol/L (ref 135–145)

## 2024-08-03 LAB — GLUCOSE, CAPILLARY
Glucose-Capillary: 101 mg/dL — ABNORMAL HIGH (ref 70–99)
Glucose-Capillary: 103 mg/dL — ABNORMAL HIGH (ref 70–99)
Glucose-Capillary: 109 mg/dL — ABNORMAL HIGH (ref 70–99)
Glucose-Capillary: 110 mg/dL — ABNORMAL HIGH (ref 70–99)
Glucose-Capillary: 115 mg/dL — ABNORMAL HIGH (ref 70–99)
Glucose-Capillary: 88 mg/dL (ref 70–99)

## 2024-08-03 LAB — MAGNESIUM: Magnesium: 2.3 mg/dL (ref 1.7–2.4)

## 2024-08-03 LAB — PHOSPHORUS: Phosphorus: 3.3 mg/dL (ref 2.5–4.6)

## 2024-08-03 MED ORDER — INSULIN ASPART 100 UNIT/ML IJ SOLN: 0.0000 [IU] | Freq: Four times a day (QID) | INTRAMUSCULAR | Status: DC

## 2024-08-03 MED ORDER — DEXTROSE 10 % IV SOLN: INTRAVENOUS | Status: AC

## 2024-08-03 MED ORDER — TRAVASOL 10 % IV SOLN
INTRAVENOUS | Status: AC
  Filled 2024-08-03: qty 1122

## 2024-08-03 MED ORDER — KETOROLAC TROMETHAMINE 15 MG/ML IJ SOLN
15.0000 mg | Freq: Four times a day (QID) | INTRAMUSCULAR | Status: DC | PRN
  Administered 2024-08-03: 15 mg via INTRAVENOUS
  Filled 2024-08-03: qty 1

## 2024-08-03 MED ORDER — PHENYLEPHRINE HCL-NACL 20-0.9 MG/250ML-% IV SOLN
INTRAVENOUS | Status: AC
  Filled 2024-08-03: qty 250

## 2024-08-03 MED ORDER — OXYCODONE HCL 5 MG PO TABS
5.0000 mg | ORAL_TABLET | ORAL | Status: DC | PRN
  Administered 2024-08-03: 5 mg via ORAL
  Filled 2024-08-03: qty 1

## 2024-08-03 NOTE — Progress Notes (Signed)
 PHARMACY - TOTAL PARENTERAL NUTRITION CONSULT NOTE   Indication: Prolonged ileus  Patient Measurements: Height: 6' 1 (185.4 cm) Weight: 64.1 kg (141 lb 5 oz) IBW/kg (Calculated) : 79.9 TPN AdjBW (KG): 65.6 Body mass index is 18.64 kg/m. Usual Weight:   Assessment: Patient is a 73yoM presented on 1/7 with recurrent SBO on CT and later concerns for appendicitis. PMH of multiple myeloma, prostatectomy on pomalyst  (on hold), SBO (usually self-resolves per patient). Patient is s/p ex-lap LOA, small bowel resection, appendectomy on 1/14. No BM or flatulence since surg. Patient at risk for refeeding syndrome given poor po intake >7 days. Pharmacy consulted for TPN management in setting of post-op prolonged ileus.   Glucose / Insulin : No hx DM, not currently on insulin   -noted few episodes of hypoglycemic (60-70s) on 1/15, since resolved post TPN initiation  -CBG goal: 140-180 (range: 103-123) -SSI/24hr: 0 units  Electrolytes:    - electrolytes WNL, including CorrCa at 9.6 Renal: Scr <1/WNL, BUN  16 Hepatic: Alb 3.1, LFTs and Tbili WNL Intake / Output; MIVF:  - PO intake: 0 mL - UOP: 675 mL, x 1 occurrence - last BM: 1/18, x 1 occurrence with a little flatus per CCS  - mIVF: LR @50  ml/hr. MD rec to increase rate given very dark urine. Will consider reducing once resolved. 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  1/9 AXR: Multiple gas-filled dilated loops of small bowel within the central abdomen, consistent with small bowel obstruction 1/13 AXR: persistent SBO 1/14 AXR: high-grade partial SBO, colonic diverticulosis   GI Surgeries / Procedures:  1/14: ex-lap LOA, small bowel resection, appendectomy  Central access: Port  TPN start date: 1/17  Nutritional Goals:  Goal TPN rate is 85 mL/hr (provides 112.2 g of protein and 2177 kcals per  day)  RD Assessment:  Estimated Needs Total Energy Estimated Needs: 2100-2300 Total Protein Estimated Needs: 100-120 grams Total Fluid Estimated Needs: >/= 2 L  Current Nutrition:  1/10 - diet advanced to clear liquids 1/12 - diet advanced to full liquids; downgraded back to clear liquids NPO, sips with meds  TPN 1/17  Plan:  Continue TPN at  85 mL/hr at 1800, the target rate  Electrolytes in TPN: Na 60 mEq/L  , K 22mEq/L, Ca 6mEq/L, Mg 49mEq/L, and Phos 15mmol/L Cl:Ac 1:2   Add standard MVI and trace elements to TPN Change SSI to  Moderate q6h SSI and adjust as needed  Thiamine  100mg  IV daily x 7 days (thru 1/24) per RD  MIVF to 67ml/hr per MD  Monitor TPN labs on Mon/Thurs, and PRN; Mg/K/Phos daily for at least 3 days     Dolphus Roller, PharmD, BCPS 08/03/2024 11:19 AM

## 2024-08-03 NOTE — Progress Notes (Signed)
 " PROGRESS NOTE  Travis Nguyen FMW:[id number] DOB: 1950/12/07 DOA: 07/21/2024 PCP: [name], MD   LOS: 13 days   Brief Narrative / Interim history: 74 year old male with history of multiple myeloma on carfilzomib  as well as daratumumab , HTN, prior prostatectomy, prior SBO's comes into the hospital with small bowel obstruction.  General surgery consulted, he has failed conservative management and will undergo surgery today  Subjective / 24h Interval events: He is doing well, abdominal pain is controlled, has been ambulating in the hallway.  No nausea or vomiting.  Has been doing sips of water  here and there and tolerating those fine.  He is passing gas but has not had a bowel movement yet.  Both  Assesement and Plan: Principal problem Small bowel obstruction-he was admitted to the hospital, NG tube was placed.  He has not improved with conservative management and he was taken to the OR on 1/14 and underwent laparoscopic LOA, small bowel resection, appendicectomy - Operative course complicated by ileus, not resolving and he was started on TPN.  Currently awaiting return of the bowel function.  Surgery following, appreciate input, diet advancement per surgery  Blood active problems Sinus bradycardia-stable no issues  Essential hypertension-blood pressure acceptable.  Continue IV PRNs  Multiple myeloma-outpatient management  Normocytic anemia-in the setting of underlying malignancy.  Closely monitor hemoglobin postoperatively, overall stable, no bleeding  Scheduled Meds:  acetaminophen   1,000 mg Oral Q6H   Chlorhexidine  Gluconate Cloth  6 each Topical Daily   enoxaparin  (LOVENOX ) injection  40 mg Subcutaneous Q24H   insulin  aspart  0-15 Units Subcutaneous Q4H   phenylephrine        sodium chloride  flush  10-40 mL Intracatheter Q12H   thiamine  (VITAMIN B1) injection  100 mg Intravenous Q24H   Continuous Infusions:  lactated ringers  50 mL/hr at 08/02/24 1910   TPN ADULT (ION) 85  mL/hr at 08/02/24 1736   PRN Meds:.alum & mag hydroxide-simeth, hydrALAZINE , HYDROmorphone  (DILAUDID ) injection, labetalol , lip balm, methocarbamol  (ROBAXIN ) injection, ondansetron  **OR** ondansetron  (ZOFRAN ) IV, mouth rinse, phenylephrine , simethicone , sodium chloride  flush, traMADol   Current Outpatient Medications  Medication Instructions   acyclovir  (ZOVIRAX ) 400 mg, Oral, 2 times daily   amLODipine  (NORVASC ) 5 mg, Oral, Daily   aspirin EC 81 mg, Oral, Daily, Swallow whole.   lactulose  (CHRONULAC ) 10 g, Oral, Daily PRN   lidocaine -prilocaine  (EMLA ) cream APPLY TOPICALLY TO PORT-A-CATH DAILY AS NEEDED   losartan  (COZAAR ) 100 mg, Oral, Daily   methocarbamol  (ROBAXIN ) 500 mg, Oral, Every 6 hours PRN   oxyCODONE -acetaminophen  (PERCOCET ) 10-325 MG tablet 1 tablet, Oral, 2 times daily PRN   oxyCODONE -acetaminophen  (PERCOCET ) 10-325 MG tablet 1 tablet, Oral, 2 times daily PRN   POMALYST  2 MG capsule TAKE 1 CAPSULE BY MOUTH DAILY  FOR 21 DAYS, THEN 7 DAYS OFF   senna-docusate (SENNA S) 8.6-50 MG tablet 1 tablet, Oral, Daily at bedtime    Diet Orders (From admission, onward)     Start     Ordered   08/02/24 0927  Diet NPO time specified Except for: Ice Chips, Sips with Meds, Other (See Comments)  Diet effective now       Comments: Ok for sips of clears  Question Answer Comment  Except for [employer]   Except for Sips with Meds   Except for Other (See Comments)      08/02/24 0932            DVT prophylaxis: enoxaparin  (LOVENOX ) injection 40 mg Start: 07/30/24 1000 SCDs Start: 07/21/24 9082  Lab Results  Component Value Date   PLT 383 08/03/2024      Code Status: Full Code  Family Communication: No family at bedside, updated wife 1/19 over the phone  Status is: Inpatient Remains inpatient appropriate because: Severity of illness   Level of care: Med-Surg  Consultants:  General surgery  Objective: Vitals:   08/03/24 0417 08/03/24 0423 08/03/24 0815 08/03/24 0816   BP: (!) 165/88  (!) 175/85   Pulse: 66  67   Resp: 17  16   Temp: 98.5 F (36.9 C)  (!) 97.2 F (36.2 C) 98.3 F (36.8 C)  TempSrc: Oral  Oral Oral  SpO2: 100%  100%   Weight:  64.1 kg    Height:        Intake/Output Summary (Last 24 hours) at 08/03/2024 1126 Last data filed at 08/03/2024 0602 Gross per 24 hour  Intake 2809.4 ml  Output 475 ml  Net 2334.4 ml   Wt Readings from Last 3 Encounters:  08/03/24 64.1 kg  07/16/24 65.4 kg  07/07/24 69.9 kg    Examination: Constitutional: NAD Eyes: lids and conjunctivae normal, no scleral icterus ENMT: mmm Neck: normal, supple Respiratory: clear to auscultation bilaterally, no wheezing, no crackles. Normal respiratory effort.  Cardiovascular: Regular rate and rhythm, no murmurs / rubs / gallops. No LE edema. Abdomen: soft, no distention, no tenderness. Bowel sounds positive.   Data Reviewed: I have independently reviewed following labs and imaging studies   CBC Recent Labs  Lab 07/28/24 0420 07/30/24 0237 07/31/24 0223 08/01/24 0552 08/03/24 0454  WBC 4.9 8.5 5.4 5.9 5.6  HGB 8.4* 9.4* 8.6* 9.1* 8.1*  HCT 26.8* 29.1* 26.6* 28.5* 24.9*  PLT 212 339 346 261 383  MCV 106.3* 104.3* 103.1* 103.6* 101.6*  MCH 33.3 33.7 33.3 33.1 33.1  MCHC 31.3 32.3 32.3 31.9 32.5  RDW 14.0 14.0 14.1 14.2 14.6    Recent Labs  Lab 07/28/24 0420 07/30/24 0237 07/31/24 0223 08/01/24 0552 08/02/24 0427 08/03/24 0454  NA 135 137 136 134* 138 136  K 4.2 3.9 3.8 4.2 4.1 4.4  CL 104 101 101 101 103 102  CO2 24 22 28 23 28 27   GLUCOSE 70 73 122* 112* 114* 111*  BUN 6* 9 16 13 14 16   CREATININE 0.60* 0.72 0.90 0.64 0.52* 0.55*  CALCIUM 8.1* 8.6* 8.6* 8.9 8.8* 8.9  AST 17 23  --  23 20  --   ALT 9 12  --  8 9  --   ALKPHOS 56 64  --  56 54  --   BILITOT 0.5 0.7  --  0.4 0.3  --   ALBUMIN 3.1* 3.4*  --  3.1* 3.1*  --   MG  --  2.1 2.1 2.2 2.1 2.3     ------------------------------------------------------------------------------------------------------------------ Recent Labs    08/02/24 0427  TRIG 132    Lab Results  Component Value Date   HGBA1C 5.3 05/18/2016   ------------------------------------------------------------------------------------------------------------------ No results for input(s): TSH, T4TOTAL, T3FREE, THYROIDAB in the last 72 hours.  Invalid input(s): FREET3  Cardiac Enzymes No results for input(s): CKMB, TROPONINI, MYOGLOBIN in the last 168 hours.  Invalid input(s): CK ------------------------------------------------------------------------------------------------------------------    Component Value Date/Time   BNP 44.1 08/04/2014 1358    CBG: Recent Labs  Lab 08/02/24 1602 08/02/24 2002 08/03/24 0000 08/03/24 0415 08/03/24 0741  GLUCAP 108* 111* 109* 115* 110*    No results found for this or any previous visit (from the past 240 hours).  Radiology Studies: No results found.   Nilda Fendt, MD, PhD [facility]  Between 7 am - 7 pm I am available, please contact me via Amion (for emergencies) or Securechat (non urgent messages)  Between 7 pm - 7 am I am not available, please contact night coverage MD/APP via Amion  "

## 2024-08-03 NOTE — Plan of Care (Signed)
   Problem: Clinical Measurements: Goal: Diagnostic test results will improve Outcome: Adequate for Discharge

## 2024-08-03 NOTE — Progress Notes (Signed)
 "  Progress Note  6 Days Post-Op  Subjective: Patient reports feeling better overall today. Feels pain is manageable overall. Denies worsening pain. Denies bowel movement. Having flatulence. Denies n/v. Has tolerated sips of water  without concerns  ROS All negative with the exception of above.  Objective: Vital signs in last 24 hours: Temp:  [97.2 F (36.2 C)-98.5 F (36.9 C)] 98.3 F (36.8 C) (01/20 0816) Pulse Rate:  [63-71] 67 (01/20 0815) Resp:  [16-17] 16 (01/20 0815) BP: (128-175)/(58-90) 175/85 (01/20 0815) SpO2:  [96 %-100 %] 100 % (01/20 0815) Weight:  [64.1 kg] 64.1 kg (01/20 0423) Last BM Date : 07/24/24  Intake/Output from previous day: 01/19 0701 - 01/20 0700 In: 2809.4 [I.V.:2809.4] Out: 675 [Urine:675] Intake/Output this shift: No intake/output data recorded.  PE: General: Peasant male who is laying in bed in NAD. HEENT: Head is normocephalic, atraumatic.  Heart: HR normal during encounter.   Lungs: Respiratory effort nonlabored on room air.  Abd: Soft with mild distention. NTTP. Incision C/D/I. No concern of cellulitis or hematoma. No rebound tenderness or guarding.  MS: Able to move all 4 extremities. Skin: Warm and dry.  Psych: A&Ox3 with an appropriate affect.    Lab Results:  Recent Labs    08/01/24 0552 08/03/24 0454  WBC 5.9 5.6  HGB 9.1* 8.1*  HCT 28.5* 24.9*  PLT 261 383   BMET Recent Labs    08/02/24 0427 08/03/24 0454  NA 138 136  K 4.1 4.4  CL 103 102  CO2 28 27  GLUCOSE 114* 111*  BUN 14 16  CREATININE 0.52* 0.55*  CALCIUM 8.8* 8.9   PT/INR No results for input(s): LABPROT, INR in the last 72 hours. CMP     Component Value Date/Time   NA 136 08/03/2024 0454   NA 138 03/27/2017 0838   K 4.4 08/03/2024 0454   K 4.3 03/27/2017 0838   CL 102 08/03/2024 0454   CL 105 01/01/2013 0835   CO2 27 08/03/2024 0454   CO2 24 03/27/2017 0838   GLUCOSE 111 (H) 08/03/2024 0454   GLUCOSE 92 03/27/2017 0838   GLUCOSE 106 (H)  01/01/2013 0835   BUN 16 08/03/2024 0454   BUN 23.7 03/27/2017 0838   CREATININE 0.55 (L) 08/03/2024 0454   CREATININE 0.89 01/09/2024 0821   CREATININE 1.2 03/27/2017 0838   CALCIUM 8.9 08/03/2024 0454   CALCIUM 9.9 03/27/2017 0838   PROT 6.1 (L) 08/02/2024 0427   PROT 7.5 03/27/2017 0838   PROT 6.9 03/27/2017 0838   ALBUMIN 3.1 (L) 08/02/2024 0427   ALBUMIN 3.9 03/27/2017 0838   AST 20 08/02/2024 0427   AST 14 (L) 01/09/2024 0821   AST 25 03/27/2017 0838   ALT 9 08/02/2024 0427   ALT 11 01/09/2024 0821   ALT 26 03/27/2017 0838   ALKPHOS 54 08/02/2024 0427   ALKPHOS 79 03/27/2017 0838   BILITOT 0.3 08/02/2024 0427   BILITOT 0.4 01/09/2024 0821   BILITOT 0.31 03/27/2017 0838   GFRNONAA >60 08/03/2024 0454   GFRNONAA >60 01/09/2024 0821   GFRAA >60 11/16/2019 1047   Lipase     Component Value Date/Time   LIPASE 19 07/21/2024 0302       Studies/Results: No results found.  Anti-infectives: Anti-infectives (From admission, onward)    Start     Dose/Rate Route Frequency Ordered Stop   07/28/24 0600  cefoTEtan  (CEFOTAN ) 2 g in sodium chloride  0.9 % 100 mL IVPB        2  g 200 mL/hr over 30 Minutes Intravenous On call to O.R. 07/27/24 1410 07/28/24 1227        Assessment/Plan POD 6: S/p laparoscopic lysis of adhesions, small bowel resection (ileum), appendectomy on 07/28/2024 by Dr. Sheldon  -Expected postop ileus. Feels to be improving overall. -Afebrile. -WBC 5.6 from 5.9; HGB 8.1 from 9.1 -NGT removed 1/16. -Pain manageable. Denies n/v. Tolerating sips of water . No BM. Having flatulence.  -Continue TPN/sips of clears only -Continue mobility as tolerated.  FEN: NPO; IVF per primary team/nutritional management VTE - SCDs; Lovenox  ID - None currently needed   LOS: 13 days   I reviewed specialist notes, nursing notes, hospitalist notes, last 24 h vitals and pain scores, last 48 h intake and output, last 24 h labs and trends, and last 24 h imaging  results.  Marjorie Carlyon Favre, [facility] Surgery 08/03/2024, 10:43 AM Please see Amion for pager number during day hours 7:00am-4:30pm  "

## 2024-08-04 DIAGNOSIS — K567 Ileus, unspecified: Secondary | ICD-10-CM | POA: Diagnosis not present

## 2024-08-04 DIAGNOSIS — I10 Essential (primary) hypertension: Secondary | ICD-10-CM

## 2024-08-04 DIAGNOSIS — K9189 Other postprocedural complications and disorders of digestive system: Secondary | ICD-10-CM

## 2024-08-04 DIAGNOSIS — K56609 Unspecified intestinal obstruction, unspecified as to partial versus complete obstruction: Secondary | ICD-10-CM | POA: Diagnosis not present

## 2024-08-04 DIAGNOSIS — C9001 Multiple myeloma in remission: Secondary | ICD-10-CM

## 2024-08-04 DIAGNOSIS — E7800 Pure hypercholesterolemia, unspecified: Secondary | ICD-10-CM | POA: Diagnosis not present

## 2024-08-04 DIAGNOSIS — R001 Bradycardia, unspecified: Secondary | ICD-10-CM | POA: Diagnosis not present

## 2024-08-04 DIAGNOSIS — E43 Unspecified severe protein-calorie malnutrition: Secondary | ICD-10-CM | POA: Diagnosis not present

## 2024-08-04 LAB — COMPREHENSIVE METABOLIC PANEL WITH GFR
ALT: 31 U/L (ref 0–44)
AST: 38 U/L (ref 15–41)
Albumin: 2.9 g/dL — ABNORMAL LOW (ref 3.5–5.0)
Alkaline Phosphatase: 68 U/L (ref 38–126)
Anion gap: 8 (ref 5–15)
BUN: 16 mg/dL (ref 8–23)
CO2: 26 mmol/L (ref 22–32)
Calcium: 8.9 mg/dL (ref 8.9–10.3)
Chloride: 101 mmol/L (ref 98–111)
Creatinine, Ser: 0.52 mg/dL — ABNORMAL LOW (ref 0.61–1.24)
GFR, Estimated: >60 mL/min
Glucose, Bld: 102 mg/dL — ABNORMAL HIGH (ref 70–99)
Potassium: 4.4 mmol/L (ref 3.5–5.1)
Sodium: 135 mmol/L (ref 135–145)
Total Bilirubin: 0.3 mg/dL (ref 0.0–1.2)
Total Protein: 6 g/dL — ABNORMAL LOW (ref 6.5–8.1)

## 2024-08-04 LAB — CBC
HCT: 25.3 % — ABNORMAL LOW (ref 39.0–52.0)
Hemoglobin: 8.2 g/dL — ABNORMAL LOW (ref 13.0–17.0)
MCH: 32.7 pg (ref 26.0–34.0)
MCHC: 32.4 g/dL (ref 30.0–36.0)
MCV: 100.8 fL — ABNORMAL HIGH (ref 80.0–100.0)
Platelets: 409 K/uL — ABNORMAL HIGH (ref 150–400)
RBC: 2.51 MIL/uL — ABNORMAL LOW (ref 4.22–5.81)
RDW: 14.5 % (ref 11.5–15.5)
WBC: 6.1 K/uL (ref 4.0–10.5)
nRBC: 0 % (ref 0.0–0.2)

## 2024-08-04 LAB — MAGNESIUM: Magnesium: 2.1 mg/dL (ref 1.7–2.4)

## 2024-08-04 LAB — GLUCOSE, CAPILLARY
Glucose-Capillary: 103 mg/dL — ABNORMAL HIGH (ref 70–99)
Glucose-Capillary: 104 mg/dL — ABNORMAL HIGH (ref 70–99)
Glucose-Capillary: 106 mg/dL — ABNORMAL HIGH (ref 70–99)
Glucose-Capillary: 116 mg/dL — ABNORMAL HIGH (ref 70–99)
Glucose-Capillary: 91 mg/dL (ref 70–99)

## 2024-08-04 LAB — PHOSPHORUS: Phosphorus: 3.6 mg/dL (ref 2.5–4.6)

## 2024-08-04 MED ORDER — ENSURE PLUS HIGH PROTEIN PO LIQD
237.0000 mL | Freq: Two times a day (BID) | ORAL | Status: DC
  Administered 2024-08-05 (×2): 237 mL via ORAL

## 2024-08-04 MED ORDER — TRAVASOL 10 % IV SOLN
INTRAVENOUS | Status: DC
  Filled 2024-08-04: qty 1122

## 2024-08-04 NOTE — Progress Notes (Signed)
 "  Progress Note  7 Days Post-Op  Subjective: He reports that he is feeling well this morning.  Continues to have flatus but no bowel movement yet.  He is hungry.  Reports his abdominal pain has been improving day by day, but that he is having significant leg pain secondary to prior orthopedic intervention and back issues compounded by being in the hospital bed..  ROS All negative with the exception of above.  Objective: Vital signs in last 24 hours: Temp:  [97.2 F (36.2 C)-98.6 F (37 C)] 97.7 F (36.5 C) (01/21 0615) Pulse Rate:  [62-67] 62 (01/21 0615) Resp:  [16-18] 18 (01/21 0615) BP: (158-175)/(81-88) 158/81 (01/21 0615) SpO2:  [98 %-100 %] 98 % (01/21 0615) Weight:  [64.1 kg] 64.1 kg (01/21 0500) Last BM Date : 07/24/24  Intake/Output from previous day: 01/20 0701 - 01/21 0700 In: 2579.5 [I.V.:2579.5] Out: 2100 [Urine:2100] Intake/Output this shift: No intake/output data recorded.  PE: General: Peasant male who is laying in bed in NAD. Lungs: Respiratory effort nonlabored on room air.  Abd: Soft with minimal distention. NTTP. Incisions C/D/I. No concern of cellulitis or hematoma.  MS: Able to move all 4 extremities. Skin: Warm and dry.  Psych: A&Ox3 with an appropriate affect.    Lab Results:  Recent Labs    08/03/24 0454 08/04/24 0509  WBC 5.6 6.1  HGB 8.1* 8.2*  HCT 24.9* 25.3*  PLT 383 409*   BMET Recent Labs    08/03/24 0454 08/04/24 0509  NA 136 135  K 4.4 4.4  CL 102 101  CO2 27 26  GLUCOSE 111* 102*  BUN 16 16  CREATININE 0.55* 0.52*  CALCIUM 8.9 8.9   PT/INR No results for input(s): LABPROT, INR in the last 72 hours. CMP     Component Value Date/Time   NA 135 08/04/2024 0509   NA 138 03/27/2017 0838   K 4.4 08/04/2024 0509   K 4.3 03/27/2017 0838   CL 101 08/04/2024 0509   CL 105 01/01/2013 0835   CO2 26 08/04/2024 0509   CO2 24 03/27/2017 0838   GLUCOSE 102 (H) 08/04/2024 0509   GLUCOSE 92 03/27/2017 0838   GLUCOSE 106  (H) 01/01/2013 0835   BUN 16 08/04/2024 0509   BUN 23.7 03/27/2017 0838   CREATININE 0.52 (L) 08/04/2024 0509   CREATININE 0.89 01/09/2024 0821   CREATININE 1.2 03/27/2017 0838   CALCIUM 8.9 08/04/2024 0509   CALCIUM 9.9 03/27/2017 0838   PROT 6.0 (L) 08/04/2024 0509   PROT 7.5 03/27/2017 0838   PROT 6.9 03/27/2017 0838   ALBUMIN 2.9 (L) 08/04/2024 0509   ALBUMIN 3.9 03/27/2017 0838   AST 38 08/04/2024 0509   AST 14 (L) 01/09/2024 0821   AST 25 03/27/2017 0838   ALT 31 08/04/2024 0509   ALT 11 01/09/2024 0821   ALT 26 03/27/2017 0838   ALKPHOS 68 08/04/2024 0509   ALKPHOS 79 03/27/2017 0838   BILITOT 0.3 08/04/2024 0509   BILITOT 0.4 01/09/2024 0821   BILITOT 0.31 03/27/2017 0838   GFRNONAA >60 08/04/2024 0509   GFRNONAA >60 01/09/2024 0821   GFRAA >60 11/16/2019 1047   Lipase     Component Value Date/Time   LIPASE 19 07/21/2024 0302       Studies/Results: No results found.  Anti-infectives: Anti-infectives (From admission, onward)    Start     Dose/Rate Route Frequency Ordered Stop   07/28/24 0600  cefoTEtan  (CEFOTAN ) 2 g in sodium chloride  0.9 %  100 mL IVPB        2 g 200 mL/hr over 30 Minutes Intravenous On call to O.R. 07/27/24 1410 07/28/24 1227        Assessment/Plan POD 8: S/p laparoscopic lysis of adhesions, small bowel resection (ileum), appendectomy on 07/28/2024 by Dr. Sheldon  -Expected postop ileus. Feels to be improving overall. -NGT removed 1/16.  Will try full liquids today. -Continue TPN -Continue mobility as tolerated.  FEN: Full liquids; IVF per primary team/nutritional management VTE - SCDs; Lovenox  ID - None currently needed   LOS: 14 days   I reviewed specialist notes, nursing notes, hospitalist notes, last 24 h vitals and pain scores, last 48 h intake and output, last 24 h labs and trends, and last 24 h imaging results.  Mitzie DELENA Freund, MD  [facility] Surgery 08/04/2024, 8:05 AM Please see Amion for pager number during  day hours 7:00am-4:30pm  "

## 2024-08-04 NOTE — Progress Notes (Signed)
 PHARMACY - TOTAL PARENTERAL NUTRITION CONSULT NOTE   Indication: Prolonged ileus  Patient Measurements: Height: 6' 1 (185.4 cm) Weight: 64.1 kg (141 lb 5 oz) IBW/kg (Calculated) : 79.9 TPN AdjBW (KG): 65.6 Body mass index is 18.64 kg/m. Usual Weight:   Assessment: Patient is a 73yoM presented on 1/7 with recurrent SBO on CT and later concerns for appendicitis. PMH of multiple myeloma, prostatectomy on pomalyst  (on hold), SBO (usually self-resolves per patient). Patient is s/p ex-lap LOA, small bowel resection, appendectomy on 1/14. No BM or flatulence since surg. Patient at risk for refeeding syndrome given poor po intake >7 days. Pharmacy consulted for TPN management in setting of post-op prolonged ileus.   Glucose / Insulin : No hx DM, not currently on insulin   -noted few episodes of hypoglycemic (60-70s) on 1/15, since resolved post TPN initiation  -CBG goal: <180 (range: 88-104) -SSI/24hr: 0 units  Electrolytes:    - electrolytes WNL, goal K >= 4 and mag >= 2 Renal: Scr <1/WNL, BUN  16 Hepatic: Alb 3.1, LFTs and Tbili WNL Intake / Output; MIVF:  - PO intake: 0 mL - UOP: 2100 mL, up - last BM: 1/18, x 1 occurrence with a little flatus per CCS  - mIVF: LR @50  ml/hr since 1/18. MD rec to increase rate given very dark urine. Will consider reducing once resolved. 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  1/9 AXR: Multiple gas-filled dilated loops of small bowel within the central abdomen, consistent with small bowel obstruction 1/13 AXR: persistent SBO 1/14 AXR: high-grade partial SBO, colonic diverticulosis   GI Surgeries / Procedures:  1/14: ex-lap LOA, small bowel resection, appendectomy  Central access: Port  TPN start date: 1/17  Nutritional Goals:  Goal TPN rate is 85 mL/hr (provides 112.2 g of protein and 2177 kcals per day)  RD  Assessment:  Estimated Needs Total Energy Estimated Needs: 2100-2300 Total Protein Estimated Needs: 100-120 grams Total Fluid Estimated Needs: >/= 2 L  Current Nutrition:  1/10 - diet advanced to clear liquids 1/12 - diet advanced to full liquids; downgraded back to clear liquids NPO, sips with meds  TPN 1/17  Plan:  Continue TPN at  85 mL/hr at 1800, the target rate  Electrolytes in TPN: Na 60 mEq/L  , K 22mEq/L, Ca 84mEq/L, Mg 62mEq/L, and Phos 15mmol/L Cl:Ac 1:2   Add standard MVI and trace elements to TPN Change SSI to  Moderate q6h SSI and adjust as needed  Thiamine  100mg  IV daily x 7 days (thru 1/24) per RD  MIVF to 72ml/hr per MD  Monitor TPN labs on Mon/Thurs, and PRN   Eva CHRISTELLA Allis, PharmD, BCPS Secure Chat if ?s 08/04/2024 7:25 AM

## 2024-08-04 NOTE — Progress Notes (Signed)
 " PROGRESS NOTE    Travis Nguyen  FMW:[id number] DOB: 1951-02-28 DOA: 07/21/2024 PCP: [name], MD   Brief Narrative:  74 y.o. M with MM on carfilzomib  and daratumumab , HTN, hx prostatectomy, hx SBO who presented with perimbilical pain and vomiting, CT confirmed SBO, NG tube placed. General Surgery consulted and he has failed conservative management and underwent Surgical intervention on 07/28/24.  Hospitalization has been complicated by post-operative ileus which ended up him getting TPN for Nutrition. Now slowly improving and diet has been advanced to FLD.  Assessment and Plan:  Small bowel obstruction-he was admitted to the hospital, NG tube was placed.  He did not improved with conservative management and he was taken to the OR on 1/14 and underwent laparoscopic LOA, small bowel resection, appendicectomy (POD 8) - Operative course complicated by ileus, not resolving and he was started on TPN.  Currently awaiting return of the bowel function.  Surgery following, appreciate input, diet advancement per surgery; Now NG has been removed (1/16) and he has been placed on FULL Liquid Diet as ileus is slowly improving.    Sinus Bradycardia-stable no issues and improved as it seemed to be vasovagal. HR ranging in the 60's to 80's.    Essential Hypertension-blood pressure acceptable.  Continue IV Hydralazine  10 mg q4hprn for SBP >159. CTM BP per Protocol. Last BP reading was 154/84   HLD/Pure Hypercholesterolemia: Currently not on any medical Tx. Follow up in the outpatient setting w/ PCP  Multiple Myeloma-outpatient management and will need F/u w/ Oncology    Normocytic Anemia-in the setting of underlying malignancy. Hgb/Hct Trend: Recent Labs  Lab 07/27/24 1350 07/28/24 0420 07/30/24 0237 07/31/24 0223 08/01/24 0552 08/03/24 0454 08/04/24 0509  HGB 8.9* 8.4* 9.4* 8.6* 9.1* 8.1* 8.2*  HCT 27.7* 26.8* 29.1* 26.6* 28.5* 24.9* 25.3*  MCV 102.6* 106.3* 104.3* 103.1* 103.6* 101.6* 100.8*   -Check Anemia Panel in the AM -Closely monitor hemoglobin postoperatively, overall stable, no bleeding   Thrombocytosis: Likely reactive as Plt Count Trend: Recent Labs  Lab 07/27/24 1350 07/28/24 0420 07/30/24 0237 07/31/24 0223 08/01/24 0552 08/03/24 0454 08/04/24 0509  PLT 226 212 339 346 261 383 409*  -CTM and Trend and Repeat CBC in the AM  Hypoalbuminemia: Patient's Albumin Lvl Trend: Recent Labs  Lab 07/23/24 0724 07/26/24 0459 07/28/24 0420 07/30/24 0237 08/01/24 0552 08/02/24 0427 08/04/24 0509  ALBUMIN 3.8 3.4* 3.1* 3.4* 3.1* 3.1* 2.9*  -CTM & Trend & repeat CMP in the AM  Severe Malnutrition in the context of chronic illness / Underweight: Nutrition Status: Nutrition Problem: Severe Malnutrition Etiology: acute illness (SBO) Signs/Symptoms: severe fat depletion, severe muscle depletion, energy intake < 75% for > 7 days Interventions: TPN   DVT prophylaxis: enoxaparin  (LOVENOX ) injection 40 mg Start: 07/30/24 1000 SCDs Start: 07/21/24 9082    Code Status: Full Code Family Communication: No family present @ bedside  Disposition Plan:  Level of care: Med-Surg Status is: Inpatient Remains inpatient appropriate because: Needs further clinical improvement and clearance by the specialists    Consultants:  General Surgery  Procedures:  As delineated as above  PROCEDURE by Dr. Sheldon:   LAPAROSCOPIC LYSIS OF AHDESIONS SMALL BOWEL RESECTION (ILEUM) APPENDECTOMY TRANSVERSUS ABDOMINIS PLANE (TAP) BLOCK - BILATERAL  Antimicrobials:  Anti-infectives (From admission, onward)    Start     Dose/Rate Route Frequency Ordered Stop   07/28/24 0600  cefoTEtan  (CEFOTAN ) 2 g in sodium chloride  0.9 % 100 mL IVPB        2 g  200 mL/hr over 30 Minutes Intravenous On call to O.R. 07/27/24 1410 07/28/24 1227       Subjective: Seen and examined at bedside and thinks he is doing better and feels better.  Diet has been advanced to a full liquid diet.  States that he is  passing some flatus and that his abdomen is not hurting like it was.  No nausea or vomiting but has not had any bowel movements.  No other concerns or complaints at this time.  Objective: Vitals:   08/04/24 0500 08/04/24 0615 08/04/24 0908 08/04/24 1305  BP:  (!) 158/81 (!) 145/86 (!) 154/84  Pulse:  62 72 81  Resp:  18 14 14   Temp:  97.7 F (36.5 C) 98.8 F (37.1 C) 97.8 F (36.6 C)  TempSrc:  Oral Oral   SpO2:  98% 100% 100%  Weight: 64.1 kg     Height:        Intake/Output Summary (Last 24 hours) at 08/04/2024 1504 Last data filed at 08/04/2024 1154 Gross per 24 hour  Intake 1984.46 ml  Output 2100 ml  Net -115.54 ml   Filed Weights   08/02/24 0500 08/03/24 0423 08/04/24 0500  Weight: 62.5 kg 64.1 kg 64.1 kg   Examination: Physical Exam:  Constitutional: Thin chronically ill-appearing African-American male in no acute distress Respiratory: Diminished to auscultation bilaterally, no wheezing, rales, rhonchi or crackles. Normal respiratory effort and patient is not tachypenic. No accessory muscle use.  Unlabored breathing Cardiovascular: RRR, no murmurs / rubs / gallops. S1 and S2 auscultated. Trace extremity edema Abdomen: Soft, minimally-tender, non-distended. Bowel sounds positive.  GU: Deferred. Musculoskeletal: No clubbing / cyanosis of digits/nails. No joint deformity upper and lower extremities.  Skin: No rashes, lesions, ulcers on limited skin evaluation. No induration; Warm and dry.  Neurologic: CN 2-12 grossly intact with no focal deficits. Romberg sign and cerebellar reflexes not assessed.  Psychiatric: Normal judgment and insight. Alert and oriented x 3. Normal mood and appropriate affect.   Data Reviewed: I have personally reviewed following labs and imaging studies  CBC: Recent Labs  Lab 07/30/24 0237 07/31/24 0223 08/01/24 0552 08/03/24 0454 08/04/24 0509  WBC 8.5 5.4 5.9 5.6 6.1  HGB 9.4* 8.6* 9.1* 8.1* 8.2*  HCT 29.1* 26.6* 28.5* 24.9* 25.3*  MCV  104.3* 103.1* 103.6* 101.6* 100.8*  PLT 339 346 261 383 409*   Basic Metabolic Panel: Recent Labs  Lab 07/31/24 0223 08/01/24 0552 08/02/24 0427 08/03/24 0454 08/04/24 0509  NA 136 134* 138 136 135  K 3.8 4.2 4.1 4.4 4.4  CL 101 101 103 102 101  CO2 28 23 28 27 26   GLUCOSE 122* 112* 114* 111* 102*  BUN 16 13 14 16 16   CREATININE 0.90 0.64 0.52* 0.55* 0.52*  CALCIUM 8.6* 8.9 8.8* 8.9 8.9  MG 2.1 2.2 2.1 2.3 2.1  PHOS 3.1 2.6 3.0 3.3 3.6   GFR: Estimated Creatinine Clearance: 76.8 mL/min (A) (by C-G formula based on SCr of 0.52 mg/dL (L)). Liver Function Tests: Recent Labs  Lab 07/30/24 0237 08/01/24 0552 08/02/24 0427 08/04/24 0509  AST 23 23 20  38  ALT 12 8 9 31   ALKPHOS 64 56 54 68  BILITOT 0.7 0.4 0.3 0.3  PROT 6.7 6.1* 6.1* 6.0*  ALBUMIN 3.4* 3.1* 3.1* 2.9*   No results for input(s): LIPASE, AMYLASE in the last 168 hours. No results for input(s): AMMONIA in the last 168 hours. Coagulation Profile: No results for input(s): INR, PROTIME in the last 168  hours. Cardiac Enzymes: No results for input(s): CKTOTAL, CKMB, CKMBINDEX, TROPONINI in the last 168 hours. BNP (last 3 results) No results for input(s): PROBNP in the last 8760 hours. HbA1C: No results for input(s): HGBA1C in the last 72 hours. CBG: Recent Labs  Lab 08/03/24 1657 08/03/24 2023 08/04/24 0015 08/04/24 0617 08/04/24 1151  GLUCAP 88 101* 104* 103* 106*   Lipid Profile: Recent Labs    08/02/24 0427  TRIG 132   Thyroid  Function Tests: No results for input(s): TSH, T4TOTAL, FREET4, T3FREE, THYROIDAB in the last 72 hours. Anemia Panel: No results for input(s): VITAMINB12, FOLATE, FERRITIN, TIBC, IRON, RETICCTPCT in the last 72 hours. Sepsis Labs: No results for input(s): PROCALCITON, LATICACIDVEN in the last 168 hours.  No results found for this or any previous visit (from the past 240 hours).   Radiology Studies: No results  found.  Scheduled Meds:  acetaminophen   1,000 mg Oral Q6H   Chlorhexidine  Gluconate Cloth  6 each Topical Daily   enoxaparin  (LOVENOX ) injection  40 mg Subcutaneous Q24H   feeding supplement  237 mL Oral BID BM   insulin  aspart  0-15 Units Subcutaneous Q6H   sodium chloride  flush  10-40 mL Intracatheter Q12H   thiamine  (VITAMIN B1) injection  100 mg Intravenous Q24H   Continuous Infusions:  lactated ringers  Stopped (08/04/24 1408)   TPN ADULT (ION) 85 mL/hr at 08/03/24 1827   TPN ADULT (ION)      LOS: 14 days   [name] Marker, DO [facility] Available via Epic secure chat 7am-7pm After these hours, please refer to coverage provider listed on [email or web address] 08/04/2024, 3:04 PM  "

## 2024-08-04 NOTE — Progress Notes (Signed)
 Nutrition Follow-up  DOCUMENTATION CODES:   Severe malnutrition in context of acute illness/injury, Underweight  INTERVENTION:   -TPN management per Pharmacy -Daily weights while on TPN -Continue 100 mg Thiamine  daily x 7 days  -Ensure Plus High Protein po BID, each supplement provides 350 kcal and 20 grams of protein.    NUTRITION DIAGNOSIS:   Severe Malnutrition related to acute illness (SBO) as evidenced by severe fat depletion, severe muscle depletion, energy intake < 75% for > 7 days.  Ongoing.  GOAL:   Patient will meet greater than or equal to 90% of their needs  Meeting with TPN  MONITOR:   Diet advancement, Labs, I & O's, Weight trends  ASSESSMENT:   74 y.o. male presented to the ED with abdominal pain and constipation. PMH includes prostate cancer, DVT, depression, HTN, multiple myeloma, small bowel obstruction, and HLD. Pt admitted with small bowel obstruction.  1/07 - Admitted; NGT placed to LIWS 1/10 - NGT removed; diet advanced to clear liquids 1/12 - diet advanced to full liquids; downgraded back to clear liquids 1/13 - NPO; NGT replaced to LIWS 1/14 - OP s/p laparoscopic lysis of adhesions, small bowel resection (7.5 cm), appendectomy 1/16 - NGT removed 1/17 - initiation of TPN    Patient now on full liquid diet today. Will order some Ensure supplements when ready. Has appetite today.   TPN continues at goal rate of 85 ml/hr, providing 2177 kcals and 112g protein.  Admission weight: 144 lbs Current weight: 141 lbs  Medications: insulin , Thiamine   Labs reviewed: CBGs: 103-106   Diet Order:   Diet Order             Diet full liquid Fluid consistency: Thin  Diet effective now                   EDUCATION NEEDS:   Not appropriate for education at this time  Skin:  Skin Assessment: Skin Integrity Issues: Skin Integrity Issues:: Incisions Incisions: 1/14 abdomen  Last BM:  1/10  Height:   Ht Readings from Last 1 Encounters:   07/21/24 6' 1 (1.854 m)    Weight:   Wt Readings from Last 1 Encounters:  08/04/24 64.1 kg    Ideal Body Weight:  83.6 kg  BMI:  Body mass index is 18.64 kg/m.  Estimated Nutritional Needs:   Kcal:  2100-2300  Protein:  100-120 grams  Fluid:  >/= 2 L   Morna Lee, MS, RD, LDN Inpatient Clinical Dietitian Contact via Secure chat

## 2024-08-05 DIAGNOSIS — E7800 Pure hypercholesterolemia, unspecified: Secondary | ICD-10-CM | POA: Diagnosis not present

## 2024-08-05 DIAGNOSIS — E43 Unspecified severe protein-calorie malnutrition: Secondary | ICD-10-CM | POA: Diagnosis not present

## 2024-08-05 DIAGNOSIS — K56609 Unspecified intestinal obstruction, unspecified as to partial versus complete obstruction: Secondary | ICD-10-CM | POA: Diagnosis not present

## 2024-08-05 DIAGNOSIS — I10 Essential (primary) hypertension: Secondary | ICD-10-CM | POA: Diagnosis not present

## 2024-08-05 LAB — CBC WITH DIFFERENTIAL/PLATELET
Abs Immature Granulocytes: 0.04 K/uL (ref 0.00–0.07)
Basophils Absolute: 0 K/uL (ref 0.0–0.1)
Basophils Relative: 0 %
Eosinophils Absolute: 0.2 K/uL (ref 0.0–0.5)
Eosinophils Relative: 3 %
HCT: 24.9 % — ABNORMAL LOW (ref 39.0–52.0)
Hemoglobin: 8.1 g/dL — ABNORMAL LOW (ref 13.0–17.0)
Immature Granulocytes: 1 %
Lymphocytes Relative: 13 %
Lymphs Abs: 0.9 K/uL (ref 0.7–4.0)
MCH: 32.8 pg (ref 26.0–34.0)
MCHC: 32.5 g/dL (ref 30.0–36.0)
MCV: 100.8 fL — ABNORMAL HIGH (ref 80.0–100.0)
Monocytes Absolute: 0.4 K/uL (ref 0.1–1.0)
Monocytes Relative: 6 %
Neutro Abs: 5.2 K/uL (ref 1.7–7.7)
Neutrophils Relative %: 77 %
Platelets: 432 K/uL — ABNORMAL HIGH (ref 150–400)
RBC: 2.47 MIL/uL — ABNORMAL LOW (ref 4.22–5.81)
RDW: 14.6 % (ref 11.5–15.5)
WBC: 6.7 K/uL (ref 4.0–10.5)
nRBC: 0 % (ref 0.0–0.2)

## 2024-08-05 LAB — COMPREHENSIVE METABOLIC PANEL WITH GFR
ALT: 33 U/L (ref 0–44)
AST: 35 U/L (ref 15–41)
Albumin: 3 g/dL — ABNORMAL LOW (ref 3.5–5.0)
Alkaline Phosphatase: 74 U/L (ref 38–126)
Anion gap: 8 (ref 5–15)
BUN: 13 mg/dL (ref 8–23)
CO2: 26 mmol/L (ref 22–32)
Calcium: 9.1 mg/dL (ref 8.9–10.3)
Chloride: 100 mmol/L (ref 98–111)
Creatinine, Ser: 0.53 mg/dL — ABNORMAL LOW (ref 0.61–1.24)
GFR, Estimated: >60 mL/min
Glucose, Bld: 107 mg/dL — ABNORMAL HIGH (ref 70–99)
Potassium: 4.2 mmol/L (ref 3.5–5.1)
Sodium: 134 mmol/L — ABNORMAL LOW (ref 135–145)
Total Bilirubin: 0.3 mg/dL (ref 0.0–1.2)
Total Protein: 6.1 g/dL — ABNORMAL LOW (ref 6.5–8.1)

## 2024-08-05 LAB — GLUCOSE, CAPILLARY
Glucose-Capillary: 108 mg/dL — ABNORMAL HIGH (ref 70–99)
Glucose-Capillary: 91 mg/dL (ref 70–99)
Glucose-Capillary: 84 mg/dL (ref 70–99)

## 2024-08-05 LAB — PHOSPHORUS: Phosphorus: 3.7 mg/dL (ref 2.5–4.6)

## 2024-08-05 LAB — MAGNESIUM: Magnesium: 2.2 mg/dL (ref 1.7–2.4)

## 2024-08-05 MED ORDER — THIAMINE MONONITRATE 100 MG PO TABS
100.0000 mg | ORAL_TABLET | Freq: Every day | ORAL | Status: DC
  Filled 2024-08-05: qty 1

## 2024-08-05 NOTE — Plan of Care (Signed)

## 2024-08-05 NOTE — Progress Notes (Signed)
 " PROGRESS NOTE    Travis Nguyen  FMW:[id number] DOB: [birth year] DOA: 07/21/2024 PCP: [name], MD   Brief Narrative:  74 y.o. M with MM on carfilzomib  and daratumumab , HTN, hx prostatectomy, hx SBO who presented with perimbilical pain and vomiting, CT confirmed SBO, NG tube placed. General Surgery consulted and he has failed conservative management and underwent Surgical intervention on 07/28/24.  Hospitalization has been complicated by post-operative ileus which ended up him getting TPN for Nutrition. Now slowly improving and diet has been advanced to Soft Diet. TPN weaning and anticipating D/C in the next 24 hours.   Assessment and Plan:  Small Bowel Obstruction-he was admitted to the hospital, NG tube was placed.  He did not improve with conservative management and he was taken to the OR on 1/14 and underwent laparoscopic LOA, small bowel resection, appendicectomy (POD 9) -Post-Operative course complicated by ileus, which was not resolving and he was started on TPN.  Now having return bowel function; Surgery following, appreciate input, diet advancement per surgery; Now NG has been removed (1/16) and he has been placed on SOFT Diet as he is now having bowel movements.  -TPN is being weaned off. If able to tolerate SOFT Diet without issues and if able to be weaned off TPN can be discharged in the next 24 hours as PT/OT recommending no follow up.   Sinus Bradycardia-stable no issues and improved as it seemed to be vasovagal. HR ranging in the 60's to 80's now    Essential Hypertension-blood pressure acceptable.  Continue IV Hydralazine  10 mg q4hprn for SBP >159. CTM BP per Protocol. Last BP reading was 149/83   HLD/Pure Hypercholesterolemia: Currently not on any medical Tx. Follow up in the outpatient setting w/ PCP  Multiple Myeloma-outpatient management and will need F/u w/ Oncology    Normocytic Anemia-in the setting of underlying malignancy. Hgb/Hct Trend stable now on the last 3  checks: Recent Labs  Lab 07/28/24 0420 07/30/24 0237 07/31/24 0223 08/01/24 0552 08/03/24 0454 08/04/24 0509 08/05/24 0459  HGB 8.4* 9.4* 8.6* 9.1* 8.1* 8.2* 8.1*  HCT 26.8* 29.1* 26.6* 28.5* 24.9* 25.3* 24.9*  MCV 106.3* 104.3* 103.1* 103.6* 101.6* 100.8* 100.8*  -Check Anemia Panel in the AM -Closely monitor hemoglobin postoperatively, overall stable, no bleeding; Repeat CBC in the  AM   Thrombocytosis: Likely reactive as Plt Count Trend slightly worsening: Recent Labs  Lab 07/28/24 0420 07/30/24 0237 07/31/24 0223 08/01/24 0552 08/03/24 0454 08/04/24 0509 08/05/24 0459  PLT 212 339 346 261 383 409* 432*  -CTM and Trend and Repeat CBC in the AM  Hypoalbuminemia: Patient's Albumin Lvl ranging from 2.9-3.4 (was 3.0 this AM). CTM & Trend & repeat CMP in the AM  Severe Malnutrition in the context of chronic illness / Underweight: Nutrition Status: Nutrition Problem: Severe Malnutrition Etiology: acute illness (SBO) Signs/Symptoms: severe fat depletion, severe muscle depletion, energy intake < 75% for > 7 days Interventions: TPN -Also on Ensure Plus High Protein po BID   DVT prophylaxis: enoxaparin  (LOVENOX ) injection 40 mg Start: 07/30/24 1000 SCDs Start: 07/21/24 9082    Code Status: Full Code Family Communication: No family present @ bedside  Disposition Plan:  Level of care: Med-Surg Status is: Inpatient Remains inpatient appropriate because: Needs further clinical improvement, weaning of TPN, and Tolerance of Diet. Anticipate D/C in the next 24 hours   Consultants:  General Surgery  Procedures:  As delineated as above  PROCEDURE by Dr. Sheldon on 07/28/24:   LAPAROSCOPIC LYSIS OF AHDESIONS  SMALL BOWEL RESECTION (ILEUM) APPENDECTOMY TRANSVERSUS ABDOMINIS PLANE (TAP) BLOCK - BILATERAL  Antimicrobials:  Anti-infectives (From admission, onward)    Start     Dose/Rate Route Frequency Ordered Stop   07/28/24 0600  cefoTEtan  (CEFOTAN ) 2 g in sodium chloride   0.9 % 100 mL IVPB        2 g 200 mL/hr over 30 Minutes Intravenous On call to O.R. 07/27/24 1410 07/28/24 1227       Subjective: Seen and examined at bedside was doing okay today.  Now having some bowel movements and feels much improved.  Denies any lightheadedness or dizziness.  Sitting in a chair and denies any concerns or complaints this time and denied any nausea or vomiting.  Objective: Vitals:   08/04/24 2016 08/05/24 0500 08/05/24 0509 08/05/24 1337  BP: (!) 155/89  (!) 149/83 (!) 153/81  Pulse: 71  65 80  Resp: 16  16 17   Temp: 98.3 F (36.8 C)  98.2 F (36.8 C) 98.6 F (37 C)  TempSrc: Oral  Oral Oral  SpO2: 99%  100% 100%  Weight:  62.7 kg    Height:        Intake/Output Summary (Last 24 hours) at 08/05/2024 1345 Last data filed at 08/05/2024 1223 Gross per 24 hour  Intake 2131.09 ml  Output 2250 ml  Net -118.91 ml   Filed Weights   08/03/24 0423 08/04/24 0500 08/05/24 0500  Weight: 64.1 kg 64.1 kg 62.7 kg   Examination: Physical Exam:  Constitutional: Thin elderly chronically ill-appearing African-American male in no acute distress Respiratory: Diminished to auscultation bilaterally, no wheezing, rales, rhonchi or crackles. Normal respiratory effort and patient is not tachypenic. No accessory muscle use.  Unlabored breathing Cardiovascular: RRR, no murmurs / rubs / gallops. S1 and S2 auscultated. No real appreciable extremity edema.  Abdomen: Soft, non-tender, non-distended. Bowel sounds positive.  GU: Deferred. Musculoskeletal: No clubbing / cyanosis of digits/nails. No joint deformity upper and lower extremities Skin: No rashes, lesions, ulcers on a limited skin evaluation. No induration; Warm and dry.  Neurologic: CN 2-12 grossly intact with no focal deficits. Romberg sign and cerebellar reflexes not assessed.  Psychiatric: Normal judgment and insight. Alert and oriented x 3. Normal mood and appropriate affect.   Data Reviewed: I have personally reviewed  following labs and imaging studies  CBC: Recent Labs  Lab 07/31/24 0223 08/01/24 0552 08/03/24 0454 08/04/24 0509 08/05/24 0459  WBC 5.4 5.9 5.6 6.1 6.7  NEUTROABS  --   --   --   --  5.2  HGB 8.6* 9.1* 8.1* 8.2* 8.1*  HCT 26.6* 28.5* 24.9* 25.3* 24.9*  MCV 103.1* 103.6* 101.6* 100.8* 100.8*  PLT 346 261 383 409* 432*   Basic Metabolic Panel: Recent Labs  Lab 08/01/24 0552 08/02/24 0427 08/03/24 0454 08/04/24 0509 08/05/24 0459  NA 134* 138 136 135 134*  K 4.2 4.1 4.4 4.4 4.2  CL 101 103 102 101 100  CO2 23 28 27 26 26   GLUCOSE 112* 114* 111* 102* 107*  BUN 13 14 16 16 13   CREATININE 0.64 0.52* 0.55* 0.52* 0.53*  CALCIUM 8.9 8.8* 8.9 8.9 9.1  MG 2.2 2.1 2.3 2.1 2.2  PHOS 2.6 3.0 3.3 3.6 3.7   GFR: Estimated Creatinine Clearance: 75.1 mL/min (A) (by C-G formula based on SCr of 0.53 mg/dL (L)). Liver Function Tests: Recent Labs  Lab 07/30/24 0237 08/01/24 0552 08/02/24 0427 08/04/24 0509 08/05/24 0459  AST 23 23 20  38 35  ALT 12 8  9 31 33  ALKPHOS 64 56 54 68 74  BILITOT 0.7 0.4 0.3 0.3 0.3  PROT 6.7 6.1* 6.1* 6.0* 6.1*  ALBUMIN 3.4* 3.1* 3.1* 2.9* 3.0*   No results for input(s): LIPASE, AMYLASE in the last 168 hours. No results for input(s): AMMONIA in the last 168 hours. Coagulation Profile: No results for input(s): INR, PROTIME in the last 168 hours. Cardiac Enzymes: No results for input(s): CKTOTAL, CKMB, CKMBINDEX, TROPONINI in the last 168 hours. BNP (last 3 results) No results for input(s): PROBNP in the last 8760 hours. HbA1C: No results for input(s): HGBA1C in the last 72 hours. CBG: Recent Labs  Lab 08/04/24 1151 08/04/24 1717 08/04/24 2354 08/05/24 0511 08/05/24 1130  GLUCAP 106* 91 116* 108* 91   Lipid Profile: No results for input(s): CHOL, HDL, LDLCALC, TRIG, CHOLHDL, LDLDIRECT in the last 72 hours. Thyroid  Function Tests: No results for input(s): TSH, T4TOTAL, FREET4, T3FREE, THYROIDAB  in the last 72 hours. Anemia Panel: No results for input(s): VITAMINB12, FOLATE, FERRITIN, TIBC, IRON, RETICCTPCT in the last 72 hours. Sepsis Labs: No results for input(s): PROCALCITON, LATICACIDVEN in the last 168 hours.  No results found for this or any previous visit (from the past 240 hours).   Radiology Studies: No results found.  Scheduled Meds:  acetaminophen   1,000 mg Oral Q6H   Chlorhexidine  Gluconate Cloth  6 each Topical Daily   enoxaparin  (LOVENOX ) injection  40 mg Subcutaneous Q24H   feeding supplement  237 mL Oral BID BM   insulin  aspart  0-15 Units Subcutaneous Q6H   sodium chloride  flush  10-40 mL Intracatheter Q12H   [START ON 08/06/2024] thiamine   100 mg Oral Daily   Continuous Infusions:  lactated ringers  50 mL/hr at 08/04/24 2002    LOS: 15 days   [name] Marker, DO [facility] Available via Epic secure chat 7am-7pm After these hours, please refer to coverage provider listed on [email or web address] 08/05/2024, 1:45 PM  "

## 2024-08-05 NOTE — Progress Notes (Addendum)
 "  Progress Note  8 Days Post-Op  Subjective: He feels ready to go home.  He tolerated full liquids yesterday with no nausea or issues whatsoever and has had numerous bowel movements overnight.  Denies any abdominal pain.  ROS All negative with the exception of above.  Objective: Vital signs in last 24 hours: Temp:  [97.8 F (36.6 C)-98.8 F (37.1 C)] 98.2 F (36.8 C) (01/22 0509) Pulse Rate:  [65-81] 65 (01/22 0509) Resp:  [14-16] 16 (01/22 0509) BP: (145-155)/(83-89) 149/83 (01/22 0509) SpO2:  [99 %-100 %] 100 % (01/22 0509) Weight:  [62.7 kg] 62.7 kg (01/22 0500) Last BM Date : 07/24/24  Intake/Output from previous day: 01/21 0701 - 01/22 0700 In: 3001.2 [P.O.:840; I.V.:2161.2] Out: 2700 [Urine:2700] Intake/Output this shift: No intake/output data recorded.  PE: General: Peasant male who is laying in bed in NAD. Lungs: Respiratory effort nonlabored on room air.  Abd: Soft , nondistended. NTTP. Incisions C/D/I. No cellulitis or hematoma.  MS: Able to move all 4 extremities. Skin: Warm and dry.  Psych: A&Ox3 with an appropriate affect.    Lab Results:  Recent Labs    08/04/24 0509 08/05/24 0459  WBC 6.1 6.7  HGB 8.2* 8.1*  HCT 25.3* 24.9*  PLT 409* 432*   BMET Recent Labs    08/04/24 0509 08/05/24 0459  NA 135 134*  K 4.4 4.2  CL 101 100  CO2 26 26  GLUCOSE 102* 107*  BUN 16 13  CREATININE 0.52* 0.53*  CALCIUM 8.9 9.1   PT/INR No results for input(s): LABPROT, INR in the last 72 hours. CMP     Component Value Date/Time   NA 134 (L) 08/05/2024 0459   NA 138 03/27/2017 0838   K 4.2 08/05/2024 0459   K 4.3 03/27/2017 0838   CL 100 08/05/2024 0459   CL 105 01/01/2013 0835   CO2 26 08/05/2024 0459   CO2 24 03/27/2017 0838   GLUCOSE 107 (H) 08/05/2024 0459   GLUCOSE 92 03/27/2017 0838   GLUCOSE 106 (H) 01/01/2013 0835   BUN 13 08/05/2024 0459   BUN 23.7 03/27/2017 0838   CREATININE 0.53 (L) 08/05/2024 0459   CREATININE 0.89 01/09/2024  0821   CREATININE 1.2 03/27/2017 0838   CALCIUM 9.1 08/05/2024 0459   CALCIUM 9.9 03/27/2017 0838   PROT 6.1 (L) 08/05/2024 0459   PROT 7.5 03/27/2017 0838   PROT 6.9 03/27/2017 0838   ALBUMIN 3.0 (L) 08/05/2024 0459   ALBUMIN 3.9 03/27/2017 0838   AST 35 08/05/2024 0459   AST 14 (L) 01/09/2024 0821   AST 25 03/27/2017 0838   ALT 33 08/05/2024 0459   ALT 11 01/09/2024 0821   ALT 26 03/27/2017 0838   ALKPHOS 74 08/05/2024 0459   ALKPHOS 79 03/27/2017 0838   BILITOT 0.3 08/05/2024 0459   BILITOT 0.4 01/09/2024 0821   BILITOT 0.31 03/27/2017 0838   GFRNONAA >60 08/05/2024 0459   GFRNONAA >60 01/09/2024 0821   GFRAA >60 11/16/2019 1047   Lipase     Component Value Date/Time   LIPASE 19 07/21/2024 0302       Studies/Results: No results found.  Anti-infectives: Anti-infectives (From admission, onward)    Start     Dose/Rate Route Frequency Ordered Stop   07/28/24 0600  cefoTEtan  (CEFOTAN ) 2 g in sodium chloride  0.9 % 100 mL IVPB        2 g 200 mL/hr over 30 Minutes Intravenous On call to O.R. 07/27/24 1410 07/28/24 1227  Assessment/Plan POD 10: S/p laparoscopic lysis of adhesions, small bowel resection (ileum), appendectomy on 07/28/2024 by Dr. Sheldon  -Tolerating full liquids and now with numerous bowel movements.  Advance to soft diet this morning. - Wean off TPN  FEN: Soft diet; IVF per primary team/nutritional management VTE - SCDs; Lovenox  ID - None currently needed  Okay for discharge home once weaned off TPN from surgery standpoint.  Appears he has outpatient prescription for narcotics, no additional meds will be ordered.   LOS: 15 days   I reviewed specialist notes, nursing notes, hospitalist notes, last 24 h vitals and pain scores, last 48 h intake and output, last 24 h labs and trends, and last 24 h imaging results.  Mitzie DELENA Freund, MD  [facility] Surgery 08/05/2024, 7:53 AM Please see Amion for pager number during day hours  7:00am-4:30pm  "

## 2024-08-06 ENCOUNTER — Encounter: Payer: Self-pay | Admitting: Hematology

## 2024-08-06 ENCOUNTER — Other Ambulatory Visit (HOSPITAL_COMMUNITY): Payer: Self-pay

## 2024-08-06 DIAGNOSIS — I10 Essential (primary) hypertension: Secondary | ICD-10-CM | POA: Diagnosis not present

## 2024-08-06 DIAGNOSIS — E7800 Pure hypercholesterolemia, unspecified: Secondary | ICD-10-CM | POA: Diagnosis not present

## 2024-08-06 DIAGNOSIS — K56609 Unspecified intestinal obstruction, unspecified as to partial versus complete obstruction: Secondary | ICD-10-CM | POA: Diagnosis not present

## 2024-08-06 DIAGNOSIS — C9001 Multiple myeloma in remission: Secondary | ICD-10-CM | POA: Diagnosis not present

## 2024-08-06 LAB — COMPREHENSIVE METABOLIC PANEL WITH GFR
ALT: 50 U/L — ABNORMAL HIGH (ref 0–44)
AST: 47 U/L — ABNORMAL HIGH (ref 15–41)
Albumin: 3.4 g/dL — ABNORMAL LOW (ref 3.5–5.0)
Alkaline Phosphatase: 81 U/L (ref 38–126)
Anion gap: 8 (ref 5–15)
BUN: 14 mg/dL (ref 8–23)
CO2: 25 mmol/L (ref 22–32)
Calcium: 8.8 mg/dL — ABNORMAL LOW (ref 8.9–10.3)
Chloride: 99 mmol/L (ref 98–111)
Creatinine, Ser: 0.62 mg/dL (ref 0.61–1.24)
GFR, Estimated: >60 mL/min
Glucose, Bld: 90 mg/dL (ref 70–99)
Potassium: 4.1 mmol/L (ref 3.5–5.1)
Sodium: 133 mmol/L — ABNORMAL LOW (ref 135–145)
Total Bilirubin: 0.4 mg/dL (ref 0.0–1.2)
Total Protein: 7 g/dL (ref 6.5–8.1)

## 2024-08-06 LAB — CBC WITH DIFFERENTIAL/PLATELET
Abs Immature Granulocytes: 0.03 K/uL (ref 0.00–0.07)
Basophils Absolute: 0 K/uL (ref 0.0–0.1)
Basophils Relative: 1 %
Eosinophils Absolute: 0.2 K/uL (ref 0.0–0.5)
Eosinophils Relative: 3 %
HCT: 26.8 % — ABNORMAL LOW (ref 39.0–52.0)
Hemoglobin: 8.2 g/dL — ABNORMAL LOW (ref 13.0–17.0)
Immature Granulocytes: 0 %
Lymphocytes Relative: 26 %
Lymphs Abs: 1.8 K/uL (ref 0.7–4.0)
MCH: 31.9 pg (ref 26.0–34.0)
MCHC: 30.6 g/dL (ref 30.0–36.0)
MCV: 104.3 fL — ABNORMAL HIGH (ref 80.0–100.0)
Monocytes Absolute: 0.6 K/uL (ref 0.1–1.0)
Monocytes Relative: 8 %
Neutro Abs: 4.2 K/uL (ref 1.7–7.7)
Neutrophils Relative %: 62 %
Platelets: 467 K/uL — ABNORMAL HIGH (ref 150–400)
RBC: 2.57 MIL/uL — ABNORMAL LOW (ref 4.22–5.81)
RDW: 14.8 % (ref 11.5–15.5)
WBC: 6.8 K/uL (ref 4.0–10.5)
nRBC: 0 % (ref 0.0–0.2)

## 2024-08-06 LAB — MAGNESIUM: Magnesium: 2.3 mg/dL (ref 1.7–2.4)

## 2024-08-06 LAB — PHOSPHORUS: Phosphorus: 3.4 mg/dL (ref 2.5–4.6)

## 2024-08-06 LAB — RETICULOCYTES
Immature Retic Fract: 31.4 % — ABNORMAL HIGH (ref 2.3–15.9)
RBC.: 2.53 MIL/uL — ABNORMAL LOW (ref 4.22–5.81)
Retic Count, Absolute: 88.8 K/uL (ref 19.0–186.0)
Retic Ct Pct: 3.5 % — ABNORMAL HIGH (ref 0.4–3.1)

## 2024-08-06 LAB — FERRITIN: Ferritin: 445 ng/mL — ABNORMAL HIGH (ref 24–336)

## 2024-08-06 LAB — IRON AND TIBC
Iron: 43 ug/dL — ABNORMAL LOW (ref 45–182)
Saturation Ratios: 14 % — ABNORMAL LOW (ref 17.9–39.5)
TIBC: 308 ug/dL (ref 250–450)
UIBC: 265 ug/dL

## 2024-08-06 LAB — VITAMIN B12: Vitamin B-12: 1321 pg/mL — ABNORMAL HIGH (ref 180–914)

## 2024-08-06 LAB — GLUCOSE, CAPILLARY
Glucose-Capillary: 75 mg/dL (ref 70–99)
Glucose-Capillary: 85 mg/dL (ref 70–99)

## 2024-08-06 LAB — FOLATE: Folate: 11.2 ng/mL

## 2024-08-06 MED ORDER — CARMEX CLASSIC LIP BALM EX OINT
TOPICAL_OINTMENT | CUTANEOUS | 0 refills | Status: AC | PRN
  Filled 2024-08-06: qty 10, fill #0

## 2024-08-06 MED ORDER — HEPARIN SOD (PORK) LOCK FLUSH 100 UNIT/ML IV SOLN
500.0000 [IU] | INTRAVENOUS | Status: AC | PRN
  Administered 2024-08-06: 500 [IU]
  Filled 2024-08-06: qty 5

## 2024-08-06 MED ORDER — ENSURE PLUS HIGH PROTEIN PO LIQD
237.0000 mL | Freq: Two times a day (BID) | ORAL | 0 refills | Status: AC
  Filled 2024-08-06: qty 2370, 5d supply, fill #0

## 2024-08-06 MED ORDER — SIMETHICONE 40 MG/0.6ML PO SUSP
80.0000 mg | Freq: Four times a day (QID) | ORAL | 0 refills | Status: AC | PRN
  Filled 2024-08-06: qty 30, 7d supply, fill #0

## 2024-08-06 MED ORDER — ONDANSETRON HCL 4 MG PO TABS
4.0000 mg | ORAL_TABLET | Freq: Four times a day (QID) | ORAL | 0 refills | Status: AC | PRN
  Filled 2024-08-06: qty 20, 5d supply, fill #0

## 2024-08-06 MED ORDER — VITAMIN B-1 100 MG PO TABS
100.0000 mg | ORAL_TABLET | Freq: Every day | ORAL | 0 refills | Status: AC
  Filled 2024-08-06: qty 30, 30d supply, fill #0

## 2024-08-06 MED ORDER — ACETAMINOPHEN 325 MG PO TABS
650.0000 mg | ORAL_TABLET | Freq: Four times a day (QID) | ORAL | 0 refills | Status: AC | PRN
  Filled 2024-08-06: qty 20, 3d supply, fill #0

## 2024-08-06 NOTE — Care Management Important Message (Signed)
 Important Message  Patient Details IM Letter given before DC Name: Travis Nguyen MRN: [id number] Date of Birth: 08/22/1950   Important Message Given:  Yes - Medicare IM     [name] 08/06/2024, 12:27 PM

## 2024-08-06 NOTE — Progress Notes (Signed)
 Patient discharged home, IV removed, port-a-cath de-accessed, discharge paperwork provided and explained, patient verbalized understanding.

## 2024-08-06 NOTE — Progress Notes (Signed)
 "  Progress Note  9 Days Post-Op  Subjective: Tolerating soft diet, denies abdominal pain, having bowel function.  Very eager to get out of the hospital.  ROS All negative with the exception of above.  Objective: Vital signs in last 24 hours: Temp:  [98.1 F (36.7 C)-98.6 F (37 C)] 98.1 F (36.7 C) (01/23 0510) Pulse Rate:  [67-80] 67 (01/23 0510) Resp:  [16-17] 16 (01/23 0510) BP: (147-153)/(81-83) 147/83 (01/23 0510) SpO2:  [100 %] 100 % (01/23 0510) Last BM Date : 08/05/24  Intake/Output from previous day: 01/22 0701 - 01/23 0700 In: 2048.8 [P.O.:860; I.V.:1188.8] Out: 1300 [Urine:1300] Intake/Output this shift: Total I/O In: 10 [I.V.:10] Out: 700 [Urine:700]  PE: General: Peasant male who is laying in bed in NAD. Lungs: Respiratory effort nonlabored on room air.  Abd: Soft , nondistended. NTTP. Incisions C/D/I. No cellulitis or hematoma.  MS: Able to move all 4 extremities. Skin: Warm and dry.  Psych: A&Ox3 with an appropriate affect.    Lab Results:  Recent Labs    08/05/24 0459 08/06/24 0357  WBC 6.7 6.8  HGB 8.1* 8.2*  HCT 24.9* 26.8*  PLT 432* 467*   BMET Recent Labs    08/05/24 0459 08/06/24 0357  NA 134* 133*  K 4.2 4.1  CL 100 99  CO2 26 25  GLUCOSE 107* 90  BUN 13 14  CREATININE 0.53* 0.62  CALCIUM 9.1 8.8*   PT/INR No results for input(s): LABPROT, INR in the last 72 hours. CMP     Component Value Date/Time   NA 133 (L) 08/06/2024 0357   NA 138 03/27/2017 0838   K 4.1 08/06/2024 0357   K 4.3 03/27/2017 0838   CL 99 08/06/2024 0357   CL 105 01/01/2013 0835   CO2 25 08/06/2024 0357   CO2 24 03/27/2017 0838   GLUCOSE 90 08/06/2024 0357   GLUCOSE 92 03/27/2017 0838   GLUCOSE 106 (H) 01/01/2013 0835   BUN 14 08/06/2024 0357   BUN 23.7 03/27/2017 0838   CREATININE 0.62 08/06/2024 0357   CREATININE 0.89 01/09/2024 0821   CREATININE 1.2 03/27/2017 0838   CALCIUM 8.8 (L) 08/06/2024 0357   CALCIUM 9.9 03/27/2017 0838    PROT 7.0 08/06/2024 0357   PROT 7.5 03/27/2017 0838   PROT 6.9 03/27/2017 0838   ALBUMIN 3.4 (L) 08/06/2024 0357   ALBUMIN 3.9 03/27/2017 0838   AST 47 (H) 08/06/2024 0357   AST 14 (L) 01/09/2024 0821   AST 25 03/27/2017 0838   ALT 50 (H) 08/06/2024 0357   ALT 11 01/09/2024 0821   ALT 26 03/27/2017 0838   ALKPHOS 81 08/06/2024 0357   ALKPHOS 79 03/27/2017 0838   BILITOT 0.4 08/06/2024 0357   BILITOT 0.4 01/09/2024 0821   BILITOT 0.31 03/27/2017 0838   GFRNONAA >60 08/06/2024 0357   GFRNONAA >60 01/09/2024 0821   GFRAA >60 11/16/2019 1047   Lipase     Component Value Date/Time   LIPASE 19 07/21/2024 0302       Studies/Results: No results found.  Anti-infectives: Anti-infectives (From admission, onward)    Start     Dose/Rate Route Frequency Ordered Stop   07/28/24 0600  cefoTEtan  (CEFOTAN ) 2 g in sodium chloride  0.9 % 100 mL IVPB        2 g 200 mL/hr over 30 Minutes Intravenous On call to O.R. 07/27/24 1410 07/28/24 1227        Assessment/Plan POD 11: S/p laparoscopic lysis of adhesions, small bowel resection (ileum),  appendectomy on 07/28/2024 by Dr. Sheldon    FEN: Soft diet; IVF per primary team/nutritional management VTE - SCDs; Lovenox  ID - None currently needed  Okay for discharge home   LOS: 16 days   I reviewed specialist notes, nursing notes, hospitalist notes, last 24 h vitals and pain scores, last 48 h intake and output, last 24 h labs and trends, and last 24 h imaging results.  Mitzie DELENA Freund, MD  [facility] Surgery 08/06/2024, 9:30 AM Please see Amion for pager number during day hours 7:00am-4:30pm  "

## 2024-08-08 NOTE — Discharge Summary (Signed)
 " Physician Discharge Summary   Patient: Travis Nguyen MRN: [id number] DOB: 1951/04/25  Admit date:     07/21/2024  Discharge date: 08/08/24  Discharge Physician: [name] Marker, DO   PCP: [name], MD   Recommendations at discharge:   Follow-up with PCP within 1 to 2 weeks repeat CBC, CMP, mag, Phos within 1 week Follow-up with general surgery outpatient setting within 1 to 2 weeks Follow-up with oncology in outpatient setting  Discharge Diagnoses: Principal Problem:   SBO (small bowel obstruction) (HCC) Active Problems:   Multiple myeloma (HCC)   Essential hypertension   Pure hypercholesterolemia   Sinus bradycardia   Protein-calorie malnutrition, severe  Resolved Problems:   * No resolved hospital problems. *  Hospital Course: 74 y.o. M with MM on carfilzomib  and daratumumab , HTN, hx prostatectomy, hx SBO who presented with perimbilical pain and vomiting, CT confirmed SBO, NG tube placed. General Surgery consulted and he has failed conservative management and underwent Surgical intervention on 07/28/24.  Hospitalization has been complicated by post-operative ileus which ended up him getting TPN for Nutrition. Now slowly improving and diet has been advanced to Soft Diet. TPN weaning and he improved and was at baseline.  He is medically stable for discharge and surgery has cleared him for discharge as well.  PT recommended no follow-up.  Referral with PCP, general surgery and oncology in outpatient setting as he is able to tolerate soft diet  Assessment and Plan:  Small Bowel Obstruction-he was admitted to the hospital, NG tube was placed.  He did not improve with conservative management and he was taken to the OR on 1/14 and underwent laparoscopic LOA, small bowel resection, appendicectomy (POD 9) -Post-Operative course complicated by ileus, which was not resolving and he was started on TPN.  Now having return bowel function; Surgery following, appreciate input, diet  advancement per surgery; Now NG has been removed (1/16) and he has been placed on SOFT Diet as he is now having bowel movements.  -TPN is being weaned off.  Able to tolerate soft diet without issues.  PT/OT recommending no follow up.   Sinus Bradycardia-stable no issues and improved as it seemed to be vasovagal. HR ranging in the 60's to 80's now    Essential Hypertension-blood pressure acceptable.  Continue IV Hydralazine  10 mg q4hprn for SBP >159. CTM BP per Protocol. Last BP reading was 149/83   HLD/Pure Hypercholesterolemia: Currently not on any medical Tx. Follow up in the outpatient setting w/ PCP  Abnormal LFTs: Likely in the setting of TPN.  Mildly elevated so recommend outpatient follow-up and if still elevated on recheck within 1 week recommend right upper quadrant ultrasound and acute hepatitis panel  Hyponatremia: Mild and Fluctuating. Na+ Trend:  Recent Labs  Lab 07/31/24 0223 08/01/24 0552 08/02/24 0427 08/03/24 0454 08/04/24 0509 08/05/24 0459 08/06/24 0357  NA 136 134* 138 136 135 134* 133*  -CTM & Trend & repeat CMP within 1 week  Multiple Myeloma-outpatient management and will need F/u w/ Oncology    Normocytic Anemia-in the setting of underlying malignancy. Hgb/Hct Trend stable now on the last 3 checks: Recent Labs  Lab 07/30/24 0237 07/31/24 0223 08/01/24 0552 08/03/24 0454 08/04/24 0509 08/05/24 0459 08/06/24 0357  HGB 9.4* 8.6* 9.1* 8.1* 8.2* 8.1* 8.2*  HCT 29.1* 26.6* 28.5* 24.9* 25.3* 24.9* 26.8*  MCV 104.3* 103.1* 103.6* 101.6* 100.8* 100.8* 104.3*  -Check Anemia Panel and showed an iron level of 43, UIBC of 265, TIBC of 308, saturation ratios of  14%, ferritin level 445, folate level 11.2 and a vitamin B12 level of 1321 -Closely monitor hemoglobin postoperatively, overall stable, no bleeding; Repeat CBC in the within 1 week   Thrombocytosis: Likely reactive as Plt Count Trend slightly worsening: Recent Labs  Lab 07/30/24 0237 07/31/24 0223  08/01/24 0552 08/03/24 0454 08/04/24 0509 08/05/24 0459 08/06/24 0357  PLT 339 346 261 383 409* 432* 467*  -CTM and Trend and Repeat CBC within 1 week  Hypoalbuminemia: Patient's Albumin Lvl ranging from 2.9-3.4 (was 3.4 this AM). CTM & Trend & repeat CMP in the AM  Severe Malnutrition in the context of chronic illness / Underweight: Nutrition Status: Nutrition Problem: Severe Malnutrition Etiology: acute illness (SBO) Signs/Symptoms: severe fat depletion, severe muscle depletion, energy intake < 75% for > 7 days Interventions: TPN -Also on Ensure Plus High Protein po BID Nutrition Documentation    Flowsheet Row ED to Hosp-Admission (Discharged) from 07/21/2024 in [facility] General Surgery  Nutrition Problem Severe Malnutrition  Etiology acute illness  [SBO]  Nutrition Goal Patient will meet greater than or equal to 90% of their needs  Interventions TPN   Consultants: General Surgery Procedures performed: As delineated above Disposition: Home Diet recommendation:  Discharge Diet Orders (From admission, onward)     Start     Ordered   08/06/24 0000  Diet general       Comments: SOFT Diet   08/06/24 1213           Regular diet DISCHARGE MEDICATION: Allergies as of 08/06/2024       Reactions   Aspirin    Pt stated raises BP         Medication List     TAKE these medications    acetaminophen  325 MG tablet Commonly known as: TYLENOL  Take 2 tablets (650 mg total) by mouth every 6 (six) hours as needed.   acyclovir  400 MG tablet Commonly known as: ZOVIRAX  Take 1 tablet (400 mg total) by mouth 2 (two) times daily.   amLODipine  5 MG tablet Commonly known as: NORVASC  Take 1 tablet (5 mg total) by mouth daily.   aspirin EC 81 MG tablet Take 81 mg by mouth daily. Swallow whole.   feeding supplement (OSMOLITE 1.2 CAL) Liqd Take 237 mLs by mouth 2 (two) times daily between meals.   lactulose  10 GM/15ML solution Commonly known as: CHRONULAC  Take 15 mLs  (10 g total) by mouth daily as needed for severe constipation.   lidocaine -prilocaine  cream Commonly known as: EMLA  APPLY TOPICALLY TO PORT-A-CATH DAILY AS NEEDED   lip balm ointment Apply topically as needed.   losartan  100 MG tablet Commonly known as: COZAAR  Take 1 tablet (100 mg total) by mouth daily.   methocarbamol  500 MG tablet Commonly known as: ROBAXIN  Take 1 tablet (500 mg total) by mouth every 6 (six) hours as needed for muscle spasms.   ondansetron  4 MG tablet Commonly known as: ZOFRAN  Take 1 tablet (4 mg total) by mouth every 6 (six) hours as needed for nausea.   oxyCODONE -acetaminophen  10-325 MG tablet Commonly known as: PERCOCET  Take 1 tablet by mouth 2 (two) times daily as needed.   Pomalyst  2 MG capsule Generic drug: pomalidomide  TAKE 1 CAPSULE BY MOUTH DAILY  FOR 21 DAYS, THEN 7 DAYS OFF   Simethicone  Drops Infants 20 MG/0.3ML drops Generic drug: simethicone  Take 1.2 mLs (80 mg total) by mouth 4 (four) times daily as needed for flatulence (PRN bloating, gassiness).   Stool Softener/Laxative 50-8.6 MG tablet Generic drug:  senna-docusate Take 1 tablet by mouth at bedtime.   thiamine  100 MG tablet Commonly known as: VITAMIN B1 Take 1 tablet (100 mg total) by mouth daily.        Follow-up Information     Sheldon Standing, MD Follow up on 08/25/2024.   Specialties: General Surgery, Colon and Rectal Surgery Why: 1:30pm, Call to confirm your appointment, Arrive 30 minutes prior to your appointment time, Please bring your insurance card and photo ID Contact information: [address] [place] [address] [phone]                Discharge Exam: Filed Weights   08/03/24 0423 08/04/24 0500 08/05/24 0500  Weight: 64.1 kg 64.1 kg 62.7 kg   Vitals:   08/05/24 1337 08/06/24 0510  BP: (!) 153/81 (!) 147/83  Pulse: 80 67  Resp: 17 16  Temp: 98.6 F (37 C) 98.1 F (36.7 C)  SpO2: 100% 100%   Examination: Physical  Exam:  Constitutional: The African-American male in no acute distress Respiratory: Diminished to auscultation bilaterally, no wheezing, rales, rhonchi or crackles. Normal respiratory effort and patient is not tachypenic. No accessory muscle use.  Unlabored breathing Cardiovascular: RRR, no murmurs / rubs / gallops. S1 and S2 auscultated. No extremity edema.  Abdomen: Soft, non-tender, non-distended. . Bowel sounds positive.  GU: Deferred. Musculoskeletal: No clubbing / cyanosis of digits/nails. No joint deformity upper and lower extremities.  Skin: No rashes, lesions, ulcers limited skin evaluation. No induration; Warm and dry.  Neurologic: CN 2-12 grossly intact with no focal deficits. Romberg sign and cerebellar reflexes not assessed.  Psychiatric: Normal judgment and insight. Alert and oriented x 3. Normal mood and appropriate affect.   Condition at discharge: stable  The results of significant diagnostics from this hospitalization (including imaging, microbiology, ancillary and laboratory) are listed below for reference.   Imaging Studies: US  EKG SITE RITE Result Date: 07/31/2024 If Site Rite image not attached, placement could not be confirmed due to current cardiac rhythm.  US  EKG SITE RITE Result Date: 07/31/2024 If Site Rite image not attached, placement could not be confirmed due to current cardiac rhythm.  DG Abd Portable 1V Result Date: 07/30/2024 CLINICAL DATA:  Encounter for imaging study to confirm nasogastric tube placement. EXAM: PORTABLE ABDOMEN - 1 VIEW COMPARISON:  07/28/2024 FINDINGS: Nasogastric tube extends into the upper abdomen and likely in the gastric body region. High-density contrast in the colon. Cholecystectomy clips in the right upper quadrant. Persistent dilated loops of small bowel in the left upper quadrant of the abdomen. IMPRESSION: 1. Nasogastric tube in the upper abdomen, likely in the gastric body region. 2. Persistent dilated loops of small bowel in the  left upper quadrant of the abdomen. Electronically Signed   By: Juliene Balder M.D.   On: 07/30/2024 08:42   DG Abd 1 View Result Date: 07/28/2024 EXAM: 1 VIEW XRAY OF THE ABDOMEN 07/28/2024 01:24:00 AM COMPARISON: 07/27/2024 CLINICAL HISTORY: The patient has a small bowel obstruction and required nasogastric tube placement. ICD10: 881154 - Small bowel obstruction (HCC). FINDINGS: LINES, TUBES AND DEVICES: Enteric tube in place with tip and side port projecting over the expected region of the gastric body. BOWEL: Numerous gas-filled dilated loops of small bowel are again seen throughout the abdomen, similar to multiple prior examinations. There is slow transit of contrast through the colon which appears stable since immediate prior examination it appears progressive since more remote prior examination of 01/11 suggesting a high-grade partial small bowel  obstruction. Diverticulosis noted of the visualized distal colon. No free intraperitoneal gas. SOFT TISSUES: Cholecystectomy clips noted. No abnormal calcifications. BONES: No acute fracture. IMPRESSION: 1. High-grade partial small bowel obstruction 2. Enteric tube tip and side port project over the gastric body. 3. Colonic diverticulosis. Electronically signed by: Dorethia Molt MD 07/28/2024 01:31 AM EST RP Workstation: [id number]   DG Abd Portable 1V Result Date: 07/27/2024 EXAM: 1 VIEW XRAY OF THE ABDOMEN 07/27/2024 10:54:00 PM COMPARISON: 07/27/2024 CLINICAL HISTORY: The patient has a small bowel obstruction (SBO). ICD10: 881154 - Small bowel obstruction. FINDINGS: LINES, TUBES AND DEVICES: Gastric catheter is now noted in the left upper quadrant. BOWEL: Contrast is noted within the colon. Persistent mild small bowel dilatation is seen without free air. No other focal abnormality is seen. SOFT TISSUES: No abnormal calcifications. BONES: No acute fracture. IMPRESSION: 1. Persistent mild small bowel obstruction. 2. No free air. Electronically signed by: Oneil Devonshire MD 07/27/2024 11:24 PM EST RP Workstation: [id number]   DG Abd Portable 1V Result Date: 07/27/2024 EXAM: 1 VIEW XRAY OF THE ABDOMEN 07/27/2024 07:59:00 PM COMPARISON: 07/27/2024 CLINICAL HISTORY: The patient has a small bowel obstruction (SBO). ICD10: 881154 - Small bowel obstruction (HCC). FINDINGS: LINES, TUBES AND DEVICES: Partially imaged central venous catheter with tip overlying the RIGHT atrium. If a NG has been placed, evaluation over the chest is recommended to assess for coiling. BOWEL: Gas distended small bowel and colon. Contrast is noted within the colon. Persistent small bowel dilatation is noted. SOFT TISSUES: Cholecystectomy clips noted. No abnormal calcifications. BONES: No acute fracture. IMPRESSION: 1. Persistent small bowel dilatation, consistent with small bowel obstruction. 2. No nasogastric catheter is identified, if one has been placed, recommend chest radiograph to assess for coiling. Electronically signed by: Oneil Devonshire MD 07/27/2024 08:04 PM EST RP Workstation: [id number]   DG Abd Portable 1V Result Date: 07/27/2024 EXAM: 1 VIEW XRAY OF THE ABDOMEN 07/27/2024 07:57:00 AM COMPARISON: 07/26/2024 CLINICAL HISTORY: The patient has a small bowel obstruction (SBO). ICD10: 881154 - Small bowel obstruction. FINDINGS: BOWEL: Persistent small bowel dilation. Enteric contrast has progressed through the colon reaching the descending / sigmoid colon junction. SOFT TISSUES: Cholecystectomy clips noted. BONES: No acute fracture. 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 IMPRESSION: 1. Persistent small bowel dilation. Enteric contrast has reached the descending / sigmoid colon junction. Electronically signed by: Norman Gatlin MD MD 07/27/2024 12:15 PM EST RP Workstation: [id number]   DG Abd Portable 1V Result Date: 07/26/2024 EXAM: 1 VIEW XRAY OF THE ABDOMEN 07/26/2024 11:32:00 AM COMPARISON: 07/25/2024 CLINICAL HISTORY: SBO (small bowel obstruction) (HCC) FINDINGS: BOWEL:  Previously seen contrast material lies within the right colon. Persistent small bowel dilatation is seen but slightly improved when compared with the prior exam. No free air is noted. SOFT TISSUES: No abnormal calcifications. BONES: No acute fracture. IMPRESSION: 1. Persistent small bowel dilatation, slightly improved compared to the prior exam, with contrast material within the right colon. 2. No free air. Electronically signed by: Oneil Devonshire MD MD 07/26/2024 11:36 PM EST RP Workstation: [id number] Views Result Date: 07/25/2024 CLINICAL DATA:  Small bowel obstruction. EXAM: ABDOMEN - 2 VIEW COMPARISON:  07/24/2024 FINDINGS: Similar marked gaseous distention of small bowel in the upper abdomen. As before, there is some contrast visible in small bowel the pelvis and right colon up to about the level of the hepatic flexure. Upright imaging shows no intraperitoneal free air. IMPRESSION: Similar marked gaseous distention of small bowel in the upper abdomen. As before,  there is some contrast visible in small bowel the pelvis and right colon up to about the level of the hepatic flexure. Electronically Signed   By: Camellia Candle M.D.   On: 07/25/2024 07:25   DG Abd 1 View Result Date: 07/24/2024 CLINICAL DATA:  Small bowel obstruction. EXAM: ABDOMEN - 1 VIEW COMPARISON:  07/24/2024 at 0540 hours. FINDINGS: Persisting gaseous distension of small bowel with gas and stool scattered in the colon. Oral contrast is seen in the colon. Partially imaged nasogastric tube in the left upper quadrant. IMPRESSION: Persistent small bowel obstruction with some contrast in the colon. Electronically Signed   By: Newell Eke M.D.   On: 07/24/2024 14:39   DG Abd Portable 1V Result Date: 07/24/2024 CLINICAL DATA:  Small bowel obstruction. EXAM: PORTABLE ABDOMEN - 1 VIEW COMPARISON:  07/23/2024 FINDINGS: Dilated small bowel loops again noted measuring up to 5.6 cm diameter over the upper abdomen. Gas and stool again  seen in the colon. NG tube is visualized with both the tip and proximal side port below the GE junction. IMPRESSION: 1. Persistent small bowel obstruction. 2. NG tube tip and proximal side port below the GE junction. Electronically Signed   By: Camellia Candle M.D.   On: 07/24/2024 07:39   DG Abd 1 View Result Date: 07/24/2024 EXAM: 1 VIEW XRAY OF THE ABDOMEN 07/24/2024 12:04:00 AM COMPARISON: 07/23/2024 CLINICAL HISTORY: Encounter for nasogastric (NG) tube placement FINDINGS: LINES, TUBES AND DEVICES: Interval advancement of an enteric tube in place with tip projecting over gastric fundus and side port likely at the gastroesophageal junction. BOWEL: Multiple gas-filled dilated loops of small bowel within central abdomen. SOFT TISSUES: Surgical clips in right upper quadrant. BONES: No acute fracture. IMPRESSION: 1. Interval advancement of an enteric tube in place with tip projecting over gastric fundus and side port likely at the gastroesophageal junction. 2. Multiple gas-filled dilated loops of small bowel within the central abdomen, consistent with small bowel obstruction. Electronically signed by: Morgane Naveau MD MD 07/24/2024 12:35 AM EST RP Workstation: [id number]   DG Abd 1 View Result Date: 07/23/2024 CLINICAL DATA:  Nasogastric tube present. EXAM: ABDOMEN - 1 VIEW COMPARISON:  There today FINDINGS: Tip of the enteric tube is below the diaphragm, the side port is in the region of the distal esophagus. Small amount of contrast persists within the proximal stomach. Dilute contrast is seen within partially imaged small bowel as before. IMPRESSION: Tip of the enteric tube below the diaphragm, the side port is in the region of the distal esophagus. Recommend advancement of at least 3-4 cm to place the side-port below the diaphragm. Electronically Signed   By: Andrea Gasman M.D.   On: 07/23/2024 16:17   DG Abd Portable 1V Result Date: 07/23/2024 CLINICAL DATA:  Enteric tube positioned EXAM: PORTABLE  ABDOMEN - 1 VIEW COMPARISON:  Abdominal radiograph dated 07/22/2024 FINDINGS: Gastric/enteric tube tip projects over the stomach. Partially imaged central venous catheter tip terminates over the superior cavoatrial junction. Decreased contrast material within the stomach. Dilute contrast material seen throughout partially imaged dilated small bowel loops. Right upper quadrant surgical clips. IMPRESSION: 1. Gastric/enteric tube tip projects over the stomach. 2. Decreased contrast material within the stomach. Dilute contrast material seen throughout partially imaged dilated small bowel loops. Electronically Signed   By: Limin  Xu M.D.   On: 07/23/2024 10:53   DG Abd 1 View Result Date: 07/23/2024 CLINICAL DATA:  Enteric tube placement EXAM: ABDOMEN - 1 VIEW COMPARISON:  Earlier same day abdominal  radiograph at 7:46 a.m. FINDINGS: Gastric/enteric tube tip projects over the distal esophagus/gastroesophageal junction. Partially imaged right chest wall port tip terminates over the superior cavoatrial junction. Small volume enteric contrast material remains within the stomach. Dilute contrast is seen throughout the partially imaged dilated small bowel. Right upper quadrant surgical clips. IMPRESSION: 1. Gastric/enteric tube tip projects over the distal esophagus/gastroesophageal junction. Recommend advancement. 2. Small volume enteric contrast material remains within the stomach. Dilute contrast is seen throughout the partially imaged dilated small bowel. Electronically Signed   By: Limin  Xu M.D.   On: 07/23/2024 10:52   DG Abd Portable 1V-Small Bowel Obstruction Protocol-initial, 8 hr delay Result Date: 07/22/2024 EXAM: 1 VIEW XRAY OF THE ABDOMEN 07/22/2024 11:17:00 PM COMPARISON: 07/22/2024 CLINICAL HISTORY: FINDINGS: LINES, TUBES AND DEVICES: Enteric tube in place with tip in gastric fundus. BOWEL: Persistently dilated loops of small bowel throughout abdomen. Examination is obtained 8 hours after administration of  oral contrast material. Dilute contrast material is demonstrated within dilated small bowel. No contrast material is yet seen in the colon. Appearance is consistent with small bowel obstruction. SOFT TISSUES: Cholecystectomy clips in right upper quadrant. No abnormal calcifications. BONES: No acute fracture. IMPRESSION: 1. Small bowel obstruction, with no oral contrast in the colon at 8 hours. 2. Enteric tube tip in the gastric fundus. Electronically signed by: Elsie Gravely MD 07/22/2024 11:24 PM EST RP Workstation: [id number]   DG Abd 1 View Result Date: 07/22/2024 CLINICAL DATA:  Nasogastric tube placement EXAM: DG ABDOMEN 1V COMPARISON:  Same day FINDINGS: Nasogastric tube tip is seen in proximal stomach. Stable small bowel dilatation is noted. Status post cholecystectomy. IMPRESSION: Nasogastric tube tip seen in proximal stomach. Electronically Signed   By: Lynwood Landy Raddle M.D.   On: 07/22/2024 18:10   DG Abd 1 View Result Date: 07/22/2024 CLINICAL DATA:  History gastric tube placement. EXAM: DG ABDOMEN 1V COMPARISON:  07/21/2024 and 07/22/2024 FINDINGS: Interval advancement of nasogastric tube now with tip over the stomach in the left upper quadrant and side-port over the expected region of the gastroesophageal junction. This could be advanced another 5 cm. Continued evidence of multiple dilated central air-filled small bowel loops compatible patient's small-bowel obstruction. No free peritoneal air. Remainder of the exam is unchanged. IMPRESSION: 1. Interval advancement of nasogastric tube with tip over the stomach in the left upper quadrant and side-port over the expected region of the gastroesophageal junction. This could be advanced another 5 cm. 2. Continued evidence of small-bowel obstruction. Electronically Signed   By: Toribio Agreste M.D.   On: 07/22/2024 09:12   DG Abd 1 View Result Date: 07/22/2024 EXAM: 1 VIEW XRAY OF THE ABDOMEN 07/22/2024 12:31:00 AM COMPARISON: 07/21/2024 CLINICAL HISTORY:  Nasogastric tube present FINDINGS: LINES, TUBES AND DEVICES: Treatment tube tip projects over the junction advancement is recommended if placement in the stomach is desired. BOWEL: Multiple dilated loops of small bowel in the abdomen measuring up to 4.2 cm. Consistent with small bowel obstruction. No improvement since previous study. SOFT TISSUES: Right upper quadrant surgical clips, likely from cholecystectomy. No abnormal calcifications. BONES: Degenerative changes in the spine and hips. No acute fracture. IMPRESSION: 1. Nasogastric tube tip projects over the gastroesophageal junction; advancement is recommended if placement in the stomach is desired. 2. Small bowel obstruction with no improvement since previous study. Electronically signed by: Elsie Gravely MD 07/22/2024 12:35 AM EST RP Workstation: [id number]   DG Abd 1 View Result Date: 07/21/2024 EXAM: 1 VIEW XRAY OF THE ABDOMEN 07/21/2024 10:31:00  PM COMPARISON: 07/21/2024 CLINICAL HISTORY: Encounter for nasogastric (NG) tube placement FINDINGS: LINES, TUBES AND DEVICES: Enteric tube in place with tip overlying Mid Esophagus and side port at Thoracic Inlet Level. Right chest Port-A-Cath in place with tip overlying Superior Cavoatrial Junction. BOWEL: Multiple loops of dilated small bowel within Central Abdomen measuring up to 5 cm, unchanged from Prior Study. SOFT TISSUES: Right Upper Quadrant surgical clips noted. No abnormal calcifications. BONES: No acute fracture. LUNGS: Bibasilar Atelectasis. IMPRESSION: 1. Enteric tube tip overlies the mid esophagus with side port at the thoracic inlet level; recommend advancement 12 cm. 2. Unchanged findings compatible with small bowel obstruction. Electronically signed by: Greig Pique MD MD 07/21/2024 10:36 PM EST RP Workstation: [id number]   DG Abdomen 1 View Result Date: 07/21/2024 CLINICAL DATA:  Evaluate nasogastric tube placement. EXAM: ABDOMEN - 1 VIEW COMPARISON:  07/21/2024 FINDINGS: Nasogastric tube  tip is in the left upper abdomen probably near the gastric cardia. Central line tip is in the upper right atrium region. Again noted are dilated loops of small bowel in the central abdomen and gaseous distension of the stomach. Cholecystectomy clips. IMPRESSION: 1. Nasogastric tube tip is in the left upper abdomen, likely near the gastric cardia. 2. Persistent dilated loops of small bowel. Electronically Signed   By: Juliene Balder M.D.   On: 07/21/2024 12:18   DG Abd Portable 1V-Small Bowel Protocol-Position Verification Result Date: 07/21/2024 EXAM: 1 VIEW XRAY OF THE ABDOMEN 07/21/2024 11:02:14 AM COMPARISON: 07/21/2024 CLINICAL HISTORY: Encounter for imaging study to confirm nasogastric (NG) tube placement FINDINGS: LINES, TUBES AND DEVICES: Interval retraction of an enteric tube with a hair-pin loop overlying the mid to lower mediastinum with the tip coursing cranially collimated off view. BOWEL: Persistent multiple dilated loops of small bowel. SOFT TISSUES: Right upper quadrant surgical clips noted. No abnormal calcifications. BONES: No acute fracture. IMPRESSION: 1. Interval retraction of the enteric tube with a hairpin loop overlying the mid to lower mediastinum, with the tip coursing cranially and collimated off view. 2. Persistent multiple dilated loops of small bowel. Electronically signed by: Morgane Naveau MD 07/21/2024 11:10 AM EST RP Workstation: [id number]   DG Abd Portable 1 View Result Date: 07/21/2024 CLINICAL DATA:  NG tube placement. EXAM: PORTABLE ABDOMEN - 1 VIEW COMPARISON:  07/21/2024 at 3:17 a.m. FINDINGS: Nasogastric tube courses into the region of the distal esophagus and turns back 180 degrees continues up into the esophagus and off the superior image as tip is not visualized. Side-port appears to be visualized at the T6-7 level. This needs to be retracted significantly before being repositioned. Continued evidence of patient's known small bowel obstruction without significant change.  Remainder of the exam is unchanged. IMPRESSION: 1. Nasogastric tube courses into the region of the distal esophagus and turns back 180 degrees and continues up into the esophagus and off the superior image as tip is not visualized. This needs to be retracted significantly before being repositioned. 2. Continued evidence of patient's known small bowel obstruction. Electronically Signed   By: Toribio Agreste M.D.   On: 07/21/2024 10:41   CT ABDOMEN PELVIS W CONTRAST Result Date: 07/21/2024 CLINICAL DATA:  Abdominal pain and constipation. History of multiple myeloma and melanoma as well as prostate cancer. EXAM: CT ABDOMEN AND PELVIS WITH CONTRAST TECHNIQUE: Multidetector CT imaging of the abdomen and pelvis was performed using the standard protocol following bolus administration of intravenous contrast. RADIATION DOSE REDUCTION: This exam was performed according to the departmental dose-optimization program which includes automated exposure control,  adjustment of the mA and/or kV according to patient size and/or use of iterative reconstruction technique. CONTRAST:  OMNIPAQUE  IOHEXOL  300 MG/ML  SOLN COMPARISON:  05/04/2024 FINDINGS: Lower chest: Dependent atelectasis noted in the lung bases, right greater than left. Hepatobiliary: 2 cm subcapsular lesion posterior right liver is stable, previously characterized as hemangioma. Gallbladder surgically absent. Common bile duct is 7-8 mm diameter, stable and upper normal to borderline increased for patient age although this may reflect sequelae of prior cholecystectomy. Pancreas: Similar diffuse dilatation of the main pancreatic duct. No obstructing mass lesion evident in the head of the pancreas. Spleen: No splenomegaly. No suspicious focal mass lesion. Adrenals/Urinary Tract: No adrenal nodule or mass. Stable simple cysts in both kidneys. Probable nonobstructing 3 mm stone upper pole right kidney. Tiny well-defined homogeneous low-density lesion in the left kidney  is too small to characterize but is statistically most likely benign and probably a cyst. No followup imaging is recommended. Mild fullness noted in both intrarenal collecting systems and ureters. Bladder unremarkable. Stomach/Bowel: Stomach is markedly distended with food and fluid although antral region is collapsed, potentially secondary to peristalsis although distal gastric edema/inflammation/antritis is not excluded. Course of the duodenum is difficult to delineate in passage under the SMA cannot be confirmed although it did appear to pass into the SMA on the study from 01/20/2023. There is marked dilatation of small bowel loops in the abdomen and pelvis measuring up to 4.2 cm diameter in the abdomen and 3.9 cm diameter in the pelvis. Fecalization of enteric contents is most prominent in a loop of small bowel just anterior to the left psoas muscle in the lower abdomen that tracks into the pelvis where there is an apparent transition zone visible on axial image 70 of series 2 in the left pelvis also visible on coronal image 57 of series 7 as an abrupt narrowing. Terminal ileum and appendix are not well visualized. Small to moderate stool volume noted in the right and transverse colon as well as the rectum. Vascular/Lymphatic: No abdominal aortic aneurysm. No abdominal aortic atherosclerotic calcification. There is no gastrohepatic or hepatoduodenal ligament lymphadenopathy. No retroperitoneal or mesenteric lymphadenopathy. No pelvic sidewall lymphadenopathy. Reproductive: Prostatectomy. Other: Small volume free fluid is identified in the abdomen and pelvis. Musculoskeletal: Marked degenerative changes noted in both hips. Bones are heterogeneously mineralized with innumerable tiny lucent and sclerotic areas in the bony anatomy compatible with the patient's reported history of multiple myeloma. IMPRESSION: 1. Marked dilatation of small bowel loops in the abdomen and pelvis with fecalization of enteric contents in  a small bowel loop of the lower abdomen and pelvis. This loop tracks into an apparent transition zone in the left pelvis. Imaging features are compatible with small bowel obstruction, likely mechanical with left pelvic adhesion a consideration. No evidence for small bowel wall thickening or pneumatosis at this time. 2. Stomach is markedly distended with food and fluid although antral region is collapsed, potentially secondary to peristalsis although distal gastric edema/inflammation is not excluded. 3. Small volume free fluid in the abdomen and pelvis. 4. Mild fullness in both intrarenal collecting systems and ureters. 5. Probable nonobstructing 3 mm stone upper pole right kidney. 6. Bones are heterogeneously mineralized with innumerable tiny lucent and sclerotic areas in the bony anatomy compatible with the patient's reported history of multiple myeloma. Electronically Signed   By: Camellia Candle M.D.   On: 07/21/2024 06:04   DG Abdomen 1 View Result Date: 07/21/2024 EXAM: 1 VIEW XRAY OF THE ABDOMEN 07/21/2024  03:21:00 AM COMPARISON: CT abdomen and pelvis 05/04/2024. CLINICAL HISTORY: constipation FINDINGS: BOWEL: There are dilated upper to mid-abdominal small bowel segments measuring up to 4.6 cm concerning for a small bowel obstruction. There is mild to moderate retained stool. CT is recommended. SOFT TISSUES: Cholecystectomy clips. BONES: No acute fracture. IMPRESSION: 1. Mild to moderate retained stool. 2. Dilated upper to mid-abdominal small bowel segments measuring up to 4.6 cm, concerning for small bowel obstruction. CT is recommended. Electronically signed by: Francis Quam MD 07/21/2024 03:36 AM EST RP Workstation: [id number]    Microbiology: Results for orders placed or performed during the hospital encounter of 06/27/24  Resp panel by RT-PCR (RSV, Flu A&B, Covid) Anterior Nasal Swab     Status: None   Collection Time: 06/27/24  3:40 PM   Specimen: Anterior Nasal Swab  Result Value Ref Range  Status   SARS Coronavirus 2 by RT PCR NEGATIVE NEGATIVE Final   Influenza A by PCR NEGATIVE NEGATIVE Final   Influenza B by PCR NEGATIVE NEGATIVE Final    Comment: (NOTE) The Xpert Xpress SARS-CoV-2/FLU/RSV plus assay is intended as an aid in the diagnosis of influenza from Nasopharyngeal swab specimens and should not be used as a sole basis for treatment. Nasal washings and aspirates are unacceptable for Xpert Xpress SARS-CoV-2/FLU/RSV testing.  Fact Sheet for Patients: bloggercourse.com  Fact Sheet for Healthcare Providers: seriousbroker.it  This test is not yet approved or cleared by the [place]  FDA and has been authorized for detection and/or diagnosis of SARS-CoV-2 by FDA under an Emergency Use Authorization (EUA). This EUA will remain in effect (meaning this test can be used) for the duration of the COVID-19 declaration under Section 564(b)(1) of the Act, 21 U.S.C. section 360bbb-3(b)(1), unless the authorization is terminated or revoked.     Resp Syncytial Virus by PCR NEGATIVE NEGATIVE Final    Comment: (NOTE) Fact Sheet for Patients: bloggercourse.com  Fact Sheet for Healthcare Providers: seriousbroker.it  This test is not yet approved or cleared by the [place]  FDA and has been authorized for detection and/or diagnosis of SARS-CoV-2 by FDA under an Emergency Use Authorization (EUA). This EUA will remain in effect (meaning this test can be used) for the duration of the COVID-19 declaration under Section 564(b)(1) of the Act, 21 U.S.C. section 360bbb-3(b)(1), unless the authorization is terminated or revoked.  Performed at [facility], [address], [place], [place] [address]    *Note: Due to a large number of results and/or encounters for the requested time period, some results have not been displayed. A complete set of results can be found in  Results Review.    Labs: CBC: Recent Labs  Lab 08/03/24 0454 08/04/24 0509 08/05/24 0459 08/06/24 0357  WBC 5.6 6.1 6.7 6.8  NEUTROABS  --   --  5.2 4.2  HGB 8.1* 8.2* 8.1* 8.2*  HCT 24.9* 25.3* 24.9* 26.8*  MCV 101.6* 100.8* 100.8* 104.3*  PLT 383 409* 432* 467*   Basic Metabolic Panel: Recent Labs  Lab 08/02/24 0427 08/03/24 0454 08/04/24 0509 08/05/24 0459 08/06/24 0357  NA 138 136 135 134* 133*  K 4.1 4.4 4.4 4.2 4.1  CL 103 102 101 100 99  CO2 28 27 26 26 25   GLUCOSE 114* 111* 102* 107* 90  BUN 14 16 16 13 14   CREATININE 0.52* 0.55* 0.52* 0.53* 0.62  CALCIUM 8.8* 8.9 8.9 9.1 8.8*  MG 2.1 2.3 2.1 2.2 2.3  PHOS 3.0 3.3 3.6 3.7 3.4   Liver  Function Tests: Recent Labs  Lab 08/02/24 0427 08/04/24 0509 08/05/24 0459 08/06/24 0357  AST 20 38 35 47*  ALT 9 31 33 50*  ALKPHOS 54 68 74 81  BILITOT 0.3 0.3 0.3 0.4  PROT 6.1* 6.0* 6.1* 7.0  ALBUMIN 3.1* 2.9* 3.0* 3.4*   CBG: Recent Labs  Lab 08/05/24 0511 08/05/24 1130 08/05/24 1726 08/06/24 0006 08/06/24 0509  GLUCAP 108* 91 84 75 85    Discharge time spent: greater than 30 minutes.  Signed: [name] Marker, DO [facility] 08/08/2024 "

## 2024-08-13 ENCOUNTER — Inpatient Hospital Stay

## 2024-08-16 ENCOUNTER — Other Ambulatory Visit: Payer: Self-pay | Admitting: Hematology

## 2024-08-16 DIAGNOSIS — C9002 Multiple myeloma in relapse: Secondary | ICD-10-CM

## 2024-08-17 ENCOUNTER — Encounter: Payer: Self-pay | Admitting: Hematology

## 2024-08-20 ENCOUNTER — Inpatient Hospital Stay

## 2024-08-27 ENCOUNTER — Inpatient Hospital Stay: Attending: Oncology

## 2024-08-27 ENCOUNTER — Inpatient Hospital Stay: Admitting: Hematology

## 2024-08-27 ENCOUNTER — Inpatient Hospital Stay
